# Patient Record
Sex: Male | Born: 1945 | Hispanic: No | Marital: Married | Smoking: Former smoker
Health system: Southern US, Community
[De-identification: ages and names within clinical notes are randomized; demographics above are authoritative.]

## PROBLEM LIST (undated history)

## (undated) ENCOUNTER — Inpatient Hospital Stay (HOSPITAL_COMMUNITY): Payer: Self-pay

## (undated) DIAGNOSIS — D6862 Lupus anticoagulant syndrome: Secondary | ICD-10-CM

## (undated) DIAGNOSIS — I639 Cerebral infarction, unspecified: Secondary | ICD-10-CM

## (undated) DIAGNOSIS — R4587 Impulsiveness: Secondary | ICD-10-CM

## (undated) DIAGNOSIS — B1910 Unspecified viral hepatitis B without hepatic coma: Secondary | ICD-10-CM

## (undated) DIAGNOSIS — D693 Immune thrombocytopenic purpura: Secondary | ICD-10-CM

## (undated) DIAGNOSIS — I10 Essential (primary) hypertension: Secondary | ICD-10-CM

## (undated) DIAGNOSIS — R569 Unspecified convulsions: Secondary | ICD-10-CM

## (undated) DIAGNOSIS — I2510 Atherosclerotic heart disease of native coronary artery without angina pectoris: Secondary | ICD-10-CM

## (undated) DIAGNOSIS — R1310 Dysphagia, unspecified: Secondary | ICD-10-CM

## (undated) DIAGNOSIS — I82409 Acute embolism and thrombosis of unspecified deep veins of unspecified lower extremity: Secondary | ICD-10-CM

## (undated) DIAGNOSIS — G4730 Sleep apnea, unspecified: Secondary | ICD-10-CM

## (undated) DIAGNOSIS — H9190 Unspecified hearing loss, unspecified ear: Secondary | ICD-10-CM

## (undated) DIAGNOSIS — S3282XA Multiple fractures of pelvis without disruption of pelvic ring, initial encounter for closed fracture: Secondary | ICD-10-CM

## (undated) HISTORY — PX: OTHER SURGICAL HISTORY: SHX169

## (undated) HISTORY — PX: CHOLECYSTECTOMY: SHX55

## (undated) HISTORY — PX: SPLENECTOMY, TOTAL: SHX788

---

## 1990-04-15 DIAGNOSIS — I2510 Atherosclerotic heart disease of native coronary artery without angina pectoris: Secondary | ICD-10-CM

## 1990-04-15 HISTORY — PX: CORONARY ANGIOPLASTY: SHX604

## 1990-04-15 HISTORY — DX: Atherosclerotic heart disease of native coronary artery without angina pectoris: I25.10

## 2000-10-14 ENCOUNTER — Ambulatory Visit (HOSPITAL_COMMUNITY): Admission: RE | Admit: 2000-10-14 | Discharge: 2000-10-14 | Payer: Self-pay | Admitting: Family Medicine

## 2000-10-14 ENCOUNTER — Encounter: Payer: Self-pay | Admitting: Family Medicine

## 2000-10-24 ENCOUNTER — Encounter: Payer: Self-pay | Admitting: General Surgery

## 2000-10-27 ENCOUNTER — Encounter: Payer: Self-pay | Admitting: General Surgery

## 2000-10-27 ENCOUNTER — Observation Stay (HOSPITAL_COMMUNITY): Admission: RE | Admit: 2000-10-27 | Discharge: 2000-10-28 | Payer: Self-pay | Admitting: General Surgery

## 2000-10-27 ENCOUNTER — Encounter (INDEPENDENT_AMBULATORY_CARE_PROVIDER_SITE_OTHER): Payer: Self-pay

## 2004-07-06 ENCOUNTER — Ambulatory Visit (HOSPITAL_COMMUNITY): Admission: RE | Admit: 2004-07-06 | Discharge: 2004-07-06 | Payer: Self-pay | Admitting: Family Medicine

## 2005-05-27 ENCOUNTER — Encounter: Admission: RE | Admit: 2005-05-27 | Discharge: 2005-05-27 | Payer: Self-pay | Admitting: Gastroenterology

## 2005-07-18 ENCOUNTER — Inpatient Hospital Stay (HOSPITAL_COMMUNITY): Admission: EM | Admit: 2005-07-18 | Discharge: 2005-07-20 | Payer: Self-pay | Admitting: Emergency Medicine

## 2005-07-20 ENCOUNTER — Ambulatory Visit: Payer: Self-pay | Admitting: Hematology & Oncology

## 2005-07-22 ENCOUNTER — Ambulatory Visit: Payer: Self-pay | Admitting: Hematology & Oncology

## 2005-07-23 LAB — CBC WITH DIFFERENTIAL/PLATELET
BASO%: 0.3 % (ref 0.0–2.0)
Basophils Absolute: 0 10e3/uL (ref 0.0–0.1)
EOS%: 0.1 % (ref 0.0–7.0)
Eosinophils Absolute: 0 10e3/uL (ref 0.0–0.5)
HCT: 36.8 % — ABNORMAL LOW (ref 38.7–49.9)
HGB: 13 g/dL (ref 13.0–17.1)
LYMPH%: 17.8 % (ref 14.0–48.0)
MCH: 31.6 pg (ref 28.0–33.4)
MCHC: 35.3 g/dL (ref 32.0–35.9)
MCV: 89.5 fL (ref 81.6–98.0)
MONO#: 0.8 10e3/uL (ref 0.1–0.9)
MONO%: 7.4 % (ref 0.0–13.0)
NEUT#: 8.1 10e3/uL — ABNORMAL HIGH (ref 1.5–6.5)
NEUT%: 74.4 % (ref 40.0–75.0)
Platelets: 236 10e3/uL (ref 145–400)
RBC: 4.12 10e6/uL — ABNORMAL LOW (ref 4.20–5.71)
RDW: 13.9 % (ref 11.2–14.6)
WBC: 10.9 10e3/uL — ABNORMAL HIGH (ref 4.0–10.0)
lymph#: 1.9 10e3/uL (ref 0.9–3.3)

## 2005-07-30 LAB — CBC WITH DIFFERENTIAL/PLATELET
Eosinophils Absolute: 0.1 10*3/uL (ref 0.0–0.5)
HGB: 13.5 g/dL (ref 13.0–17.1)
MONO#: 0.9 10*3/uL (ref 0.1–0.9)
NEUT#: 6.2 10*3/uL (ref 1.5–6.5)
Platelets: 192 10*3/uL (ref 145–400)
RBC: 4.28 10*6/uL (ref 4.20–5.71)
RDW: 14.6 % (ref 11.2–14.6)
WBC: 9.8 10*3/uL (ref 4.0–10.0)

## 2005-07-30 LAB — CHCC SMEAR

## 2005-08-09 LAB — CBC WITH DIFFERENTIAL/PLATELET
BASO%: 0 % (ref 0.0–2.0)
Basophils Absolute: 0 10*3/uL (ref 0.0–0.1)
Eosinophils Absolute: 0 10*3/uL (ref 0.0–0.5)
HCT: 43.8 % (ref 38.7–49.9)
HGB: 15 g/dL (ref 13.0–17.1)
LYMPH%: 6.2 % — ABNORMAL LOW (ref 14.0–48.0)
MCHC: 34.2 g/dL (ref 32.0–35.9)
MONO#: 0.5 10*3/uL (ref 0.1–0.9)
NEUT%: 89.8 % — ABNORMAL HIGH (ref 40.0–75.0)
Platelets: 157 10*3/uL (ref 145–400)
WBC: 13.4 10*3/uL — ABNORMAL HIGH (ref 4.0–10.0)
lymph#: 0.8 10*3/uL — ABNORMAL LOW (ref 0.9–3.3)

## 2005-08-09 LAB — MORPHOLOGY: PLT EST: ADEQUATE

## 2005-08-16 LAB — CBC WITH DIFFERENTIAL/PLATELET
Basophils Absolute: 0 10*3/uL (ref 0.0–0.1)
EOS%: 4.7 % (ref 0.0–7.0)
Eosinophils Absolute: 0.4 10*3/uL (ref 0.0–0.5)
HGB: 13.8 g/dL (ref 13.0–17.1)
LYMPH%: 16.1 % (ref 14.0–48.0)
MCH: 31.7 pg (ref 28.0–33.4)
MCV: 91.4 fL (ref 81.6–98.0)
MONO%: 9.1 % (ref 0.0–13.0)
NEUT#: 5.6 10*3/uL (ref 1.5–6.5)
Platelets: 84 10*3/uL — ABNORMAL LOW (ref 145–400)

## 2005-08-16 LAB — MORPHOLOGY

## 2005-08-26 LAB — CBC WITH DIFFERENTIAL/PLATELET
BASO%: 0.2 % (ref 0.0–2.0)
Basophils Absolute: 0 10*3/uL (ref 0.0–0.1)
Eosinophils Absolute: 0 10*3/uL (ref 0.0–0.5)
HCT: 41.6 % (ref 38.7–49.9)
HGB: 14.5 g/dL (ref 13.0–17.1)
MCHC: 34.9 g/dL (ref 32.0–35.9)
MONO#: 0.6 10*3/uL (ref 0.1–0.9)
NEUT#: 12.6 10*3/uL — ABNORMAL HIGH (ref 1.5–6.5)
NEUT%: 85.7 % — ABNORMAL HIGH (ref 40.0–75.0)
WBC: 14.7 10*3/uL — ABNORMAL HIGH (ref 4.0–10.0)
lymph#: 1.4 10*3/uL (ref 0.9–3.3)

## 2005-08-26 LAB — MORPHOLOGY: PLT EST: ADEQUATE

## 2005-09-01 LAB — ABO AND RH: Rh Type: POSITIVE

## 2005-09-10 ENCOUNTER — Ambulatory Visit: Payer: Self-pay | Admitting: Hematology & Oncology

## 2005-09-10 LAB — MORPHOLOGY: PLT EST: DECREASED

## 2005-09-10 LAB — CBC WITH DIFFERENTIAL/PLATELET
Basophils Absolute: 0 10*3/uL (ref 0.0–0.1)
Eosinophils Absolute: 0 10*3/uL (ref 0.0–0.5)
HGB: 9.4 g/dL — ABNORMAL LOW (ref 13.0–17.1)
MONO#: 0.5 10*3/uL (ref 0.1–0.9)
MONO%: 4.8 % (ref 0.0–13.0)
NEUT#: 8.2 10*3/uL — ABNORMAL HIGH (ref 1.5–6.5)
RBC: 2.73 10*6/uL — ABNORMAL LOW (ref 4.20–5.71)
RDW: 15.5 % — ABNORMAL HIGH (ref 11.2–14.6)
WBC: 10.3 10*3/uL — ABNORMAL HIGH (ref 4.0–10.0)
lymph#: 1.5 10*3/uL (ref 0.9–3.3)

## 2005-09-16 LAB — CBC WITH DIFFERENTIAL/PLATELET
Eosinophils Absolute: 0 10*3/uL (ref 0.0–0.5)
MONO#: 0.5 10*3/uL (ref 0.1–0.9)
NEUT#: 6 10*3/uL (ref 1.5–6.5)
Platelets: 134 10*3/uL — ABNORMAL LOW (ref 145–400)
RBC: 2.94 10*6/uL — ABNORMAL LOW (ref 4.20–5.71)
RDW: 19.9 % — ABNORMAL HIGH (ref 11.2–14.6)
WBC: 8.3 10*3/uL (ref 4.0–10.0)

## 2005-09-16 LAB — MORPHOLOGY: PLT EST: DECREASED

## 2005-09-23 LAB — CBC WITH DIFFERENTIAL/PLATELET
BASO%: 0.4 % (ref 0.0–2.0)
EOS%: 0.1 % (ref 0.0–7.0)
MCH: 35.2 pg — ABNORMAL HIGH (ref 28.0–33.4)
MCHC: 34.4 g/dL (ref 32.0–35.9)
RDW: 17.2 % — ABNORMAL HIGH (ref 11.2–14.6)
lymph#: 2.2 10*3/uL (ref 0.9–3.3)

## 2005-09-23 LAB — MORPHOLOGY

## 2005-09-30 LAB — CBC WITH DIFFERENTIAL/PLATELET
Basophils Absolute: 0 10*3/uL (ref 0.0–0.1)
Eosinophils Absolute: 0.1 10*3/uL (ref 0.0–0.5)
HCT: 34.7 % — ABNORMAL LOW (ref 38.7–49.9)
HGB: 11.9 g/dL — ABNORMAL LOW (ref 13.0–17.1)
MCV: 102.3 fL — ABNORMAL HIGH (ref 81.6–98.0)
MONO%: 10.2 % (ref 0.0–13.0)
NEUT#: 4.8 10*3/uL (ref 1.5–6.5)
NEUT%: 59 % (ref 40.0–75.0)
RDW: 15.7 % — ABNORMAL HIGH (ref 11.2–14.6)
lymph#: 2.4 10*3/uL (ref 0.9–3.3)

## 2005-10-14 LAB — CBC WITH DIFFERENTIAL/PLATELET
BASO%: 0.5 % (ref 0.0–2.0)
Eosinophils Absolute: 0.2 10*3/uL (ref 0.0–0.5)
MONO#: 1 10*3/uL — ABNORMAL HIGH (ref 0.1–0.9)
NEUT#: 7.1 10*3/uL — ABNORMAL HIGH (ref 1.5–6.5)
RBC: 4.07 10*6/uL — ABNORMAL LOW (ref 4.20–5.71)
RDW: 14.1 % (ref 11.2–14.6)
WBC: 11.4 10*3/uL — ABNORMAL HIGH (ref 4.0–10.0)

## 2005-10-22 ENCOUNTER — Ambulatory Visit: Payer: Self-pay | Admitting: Hematology & Oncology

## 2005-10-31 LAB — CBC WITH DIFFERENTIAL/PLATELET
Eosinophils Absolute: 0.1 10*3/uL (ref 0.0–0.5)
HCT: 39.9 % (ref 38.7–49.9)
LYMPH%: 18.5 % (ref 14.0–48.0)
MONO#: 0.9 10*3/uL (ref 0.1–0.9)
NEUT#: 9.4 10*3/uL — ABNORMAL HIGH (ref 1.5–6.5)
NEUT%: 73.3 % (ref 40.0–75.0)
Platelets: 191 10*3/uL (ref 145–400)
RBC: 4.15 10*6/uL — ABNORMAL LOW (ref 4.20–5.71)
WBC: 12.8 10*3/uL — ABNORMAL HIGH (ref 4.0–10.0)
lymph#: 2.4 10*3/uL (ref 0.9–3.3)

## 2005-11-22 LAB — CBC WITH DIFFERENTIAL/PLATELET
BASO%: 0.4 % (ref 0.0–2.0)
EOS%: 1.2 % (ref 0.0–7.0)
HCT: 40 % (ref 38.7–49.9)
LYMPH%: 32.5 % (ref 14.0–48.0)
MCH: 32 pg (ref 28.0–33.4)
MCHC: 34.7 g/dL (ref 32.0–35.9)
NEUT%: 57 % (ref 40.0–75.0)
lymph#: 2.6 10*3/uL (ref 0.9–3.3)

## 2005-12-19 ENCOUNTER — Ambulatory Visit: Payer: Self-pay | Admitting: Hematology & Oncology

## 2005-12-20 LAB — CBC WITH DIFFERENTIAL/PLATELET
BASO%: 0.4 % (ref 0.0–2.0)
EOS%: 0.5 % (ref 0.0–7.0)
MCH: 31.7 pg (ref 28.0–33.4)
MCHC: 35 g/dL (ref 32.0–35.9)
MCV: 90.6 fL (ref 81.6–98.0)
MONO%: 6.4 % (ref 0.0–13.0)
NEUT#: 10.6 10*3/uL — ABNORMAL HIGH (ref 1.5–6.5)
RBC: 4.7 10*6/uL (ref 4.20–5.71)
RDW: 13.8 % (ref 11.2–14.6)

## 2006-02-13 ENCOUNTER — Ambulatory Visit: Payer: Self-pay | Admitting: Hematology & Oncology

## 2006-03-05 LAB — CBC WITH DIFFERENTIAL/PLATELET
Basophils Absolute: 0 10*3/uL (ref 0.0–0.1)
EOS%: 1.1 % (ref 0.0–7.0)
Eosinophils Absolute: 0.1 10*3/uL (ref 0.0–0.5)
HCT: 40.8 % (ref 38.7–49.9)
HGB: 14.4 g/dL (ref 13.0–17.1)
MCH: 31.7 pg (ref 28.0–33.4)
MONO#: 1 10*3/uL — ABNORMAL HIGH (ref 0.1–0.9)
NEUT%: 58.4 % (ref 40.0–75.0)
lymph#: 2.1 10*3/uL (ref 0.9–3.3)

## 2006-03-10 LAB — CBC WITH DIFFERENTIAL/PLATELET
BASO%: 0.3 % (ref 0.0–2.0)
EOS%: 0.4 % (ref 0.0–7.0)
HCT: 40 % (ref 38.7–49.9)
LYMPH%: 31.3 % (ref 14.0–48.0)
MCH: 31.7 pg (ref 28.0–33.4)
MCHC: 35.1 g/dL (ref 32.0–35.9)
MCV: 90.2 fL (ref 81.6–98.0)
MONO#: 1 10*3/uL — ABNORMAL HIGH (ref 0.1–0.9)
NEUT%: 58.3 % (ref 40.0–75.0)
Platelets: 205 10*3/uL (ref 145–400)

## 2006-03-17 LAB — CBC WITH DIFFERENTIAL/PLATELET
Basophils Absolute: 0 10*3/uL (ref 0.0–0.1)
Eosinophils Absolute: 0.1 10*3/uL (ref 0.0–0.5)
HGB: 14.9 g/dL (ref 13.0–17.1)
LYMPH%: 36.4 % (ref 14.0–48.0)
MCV: 89.4 fL (ref 81.6–98.0)
MONO#: 1.5 10*3/uL — ABNORMAL HIGH (ref 0.1–0.9)
MONO%: 25.6 % — ABNORMAL HIGH (ref 0.0–13.0)
NEUT#: 2.1 10*3/uL (ref 1.5–6.5)
Platelets: 175 10*3/uL (ref 145–400)

## 2006-03-21 ENCOUNTER — Ambulatory Visit (HOSPITAL_COMMUNITY): Admission: RE | Admit: 2006-03-21 | Discharge: 2006-03-21 | Payer: Self-pay | Admitting: Gastroenterology

## 2006-03-27 ENCOUNTER — Ambulatory Visit: Payer: Self-pay | Admitting: Hematology & Oncology

## 2006-04-01 LAB — CBC WITH DIFFERENTIAL/PLATELET
Basophils Absolute: 0 10*3/uL (ref 0.0–0.1)
Eosinophils Absolute: 0.1 10*3/uL (ref 0.0–0.5)
HGB: 13.5 g/dL (ref 13.0–17.1)
MONO#: 0.8 10*3/uL (ref 0.1–0.9)
NEUT#: 3.1 10*3/uL (ref 1.5–6.5)
RDW: 13.5 % (ref 11.2–14.6)
lymph#: 2.4 10*3/uL (ref 0.9–3.3)

## 2006-04-11 LAB — CBC WITH DIFFERENTIAL/PLATELET
Eosinophils Absolute: 0 10*3/uL (ref 0.0–0.5)
HCT: 42.5 % (ref 38.7–49.9)
LYMPH%: 9.9 % — ABNORMAL LOW (ref 14.0–48.0)
MONO#: 0.3 10*3/uL (ref 0.1–0.9)
NEUT#: 6.6 10*3/uL — ABNORMAL HIGH (ref 1.5–6.5)
NEUT%: 85.8 % — ABNORMAL HIGH (ref 40.0–75.0)
Platelets: 68 10*3/uL — ABNORMAL LOW (ref 145–400)
RBC: 4.81 10*6/uL (ref 4.20–5.71)
WBC: 7.7 10*3/uL (ref 4.0–10.0)

## 2006-04-11 LAB — CHCC SMEAR

## 2006-04-18 ENCOUNTER — Encounter (HOSPITAL_COMMUNITY): Admission: RE | Admit: 2006-04-18 | Discharge: 2006-06-25 | Payer: Self-pay | Admitting: Hematology & Oncology

## 2006-04-18 LAB — CBC WITH DIFFERENTIAL/PLATELET
BASO%: 0.8 % (ref 0.0–2.0)
Basophils Absolute: 0 10*3/uL (ref 0.0–0.1)
Eosinophils Absolute: 0.1 10*3/uL (ref 0.0–0.5)
HCT: 39.9 % (ref 38.7–49.9)
HGB: 14.4 g/dL (ref 13.0–17.1)
LYMPH%: 25.5 % (ref 14.0–48.0)
MCHC: 36.2 g/dL — ABNORMAL HIGH (ref 32.0–35.9)
MONO#: 0.9 10*3/uL (ref 0.1–0.9)
NEUT#: 3.4 10*3/uL (ref 1.5–6.5)
NEUT%: 57.9 % (ref 40.0–75.0)
Platelets: <6 10*3/uL — CL (ref 145–400)
WBC: 6 10*3/uL (ref 4.0–10.0)
lymph#: 1.5 10*3/uL (ref 0.9–3.3)

## 2006-04-22 LAB — CBC WITH DIFFERENTIAL/PLATELET
Basophils Absolute: 0.1 10*3/uL (ref 0.0–0.1)
Eosinophils Absolute: 0.1 10*3/uL (ref 0.0–0.5)
HCT: 34.7 % — ABNORMAL LOW (ref 38.7–49.9)
HGB: 12.4 g/dL — ABNORMAL LOW (ref 13.0–17.1)
MCH: 31.2 pg (ref 28.0–33.4)
MCV: 87.1 fL (ref 81.6–98.0)
MONO%: 11.8 % (ref 0.0–13.0)
NEUT#: 3.7 10*3/uL (ref 1.5–6.5)
NEUT%: 55.2 % (ref 40.0–75.0)
RDW: 12.1 % (ref 11.2–14.6)
lymph#: 2.1 10*3/uL (ref 0.9–3.3)

## 2006-04-25 LAB — CBC WITH DIFFERENTIAL/PLATELET
Basophils Absolute: 0 10*3/uL (ref 0.0–0.1)
EOS%: 0.9 % (ref 0.0–7.0)
Eosinophils Absolute: 0.1 10*3/uL (ref 0.0–0.5)
HGB: 14.2 g/dL (ref 13.0–17.1)
LYMPH%: 22.9 % (ref 14.0–48.0)
MCH: 31.4 pg (ref 28.0–33.4)
MCV: 86 fL (ref 81.6–98.0)
MONO%: 11.3 % (ref 0.0–13.0)
NEUT#: 5.6 10*3/uL (ref 1.5–6.5)
Platelets: 228 10*3/uL (ref 145–400)

## 2006-04-25 LAB — CHCC SMEAR

## 2006-05-01 ENCOUNTER — Emergency Department (HOSPITAL_COMMUNITY): Admission: EM | Admit: 2006-05-01 | Discharge: 2006-05-01 | Payer: Self-pay | Admitting: Emergency Medicine

## 2006-05-03 ENCOUNTER — Emergency Department (HOSPITAL_COMMUNITY): Admission: EM | Admit: 2006-05-03 | Discharge: 2006-05-03 | Payer: Self-pay | Admitting: Emergency Medicine

## 2006-05-08 LAB — CBC WITH DIFFERENTIAL/PLATELET
Basophils Absolute: 0 10*3/uL (ref 0.0–0.1)
EOS%: 0.4 % (ref 0.0–7.0)
Eosinophils Absolute: 0 10*3/uL (ref 0.0–0.5)
HGB: 14 g/dL (ref 13.0–17.1)
MCH: 32.1 pg (ref 28.0–33.4)
MONO%: 5.7 % (ref 0.0–13.0)
NEUT#: 9.5 10*3/uL — ABNORMAL HIGH (ref 1.5–6.5)
RBC: 4.36 10*6/uL (ref 4.20–5.71)
RDW: 13.6 % (ref 11.2–14.6)
lymph#: 1.2 10*3/uL (ref 0.9–3.3)

## 2006-05-08 LAB — CHCC SMEAR

## 2006-05-09 ENCOUNTER — Ambulatory Visit: Payer: Self-pay | Admitting: Hematology & Oncology

## 2006-05-14 LAB — CBC WITH DIFFERENTIAL/PLATELET
BASO%: 0.3 % (ref 0.0–2.0)
Eosinophils Absolute: 0.1 10*3/uL (ref 0.0–0.5)
LYMPH%: 31 % (ref 14.0–48.0)
MCHC: 34.7 g/dL (ref 32.0–35.9)
MONO#: 0.6 10*3/uL (ref 0.1–0.9)
NEUT#: 4.6 10*3/uL (ref 1.5–6.5)
Platelets: 146 10*3/uL (ref 145–400)
RBC: 4.29 10*6/uL (ref 4.20–5.71)
WBC: 7.8 10*3/uL (ref 4.0–10.0)
lymph#: 2.4 10*3/uL (ref 0.9–3.3)

## 2006-05-16 ENCOUNTER — Ambulatory Visit (HOSPITAL_COMMUNITY): Admission: RE | Admit: 2006-05-16 | Discharge: 2006-05-16 | Payer: Self-pay | Admitting: Radiology

## 2006-05-16 ENCOUNTER — Encounter: Admission: RE | Admit: 2006-05-16 | Discharge: 2006-05-16 | Payer: Self-pay | Admitting: Specialist

## 2006-05-22 LAB — CBC WITH DIFFERENTIAL/PLATELET
BASO%: 0.5 % (ref 0.0–2.0)
Basophils Absolute: 0 10*3/uL (ref 0.0–0.1)
Eosinophils Absolute: 0.1 10*3/uL (ref 0.0–0.5)
HCT: 40.8 % (ref 38.7–49.9)
HGB: 14.2 g/dL (ref 13.0–17.1)
LYMPH%: 27.9 % (ref 14.0–48.0)
MONO#: 0.7 10*3/uL (ref 0.1–0.9)
NEUT#: 3.5 10*3/uL (ref 1.5–6.5)
NEUT%: 57.3 % (ref 40.0–75.0)
Platelets: 125 10*3/uL — ABNORMAL LOW (ref 145–400)
WBC: 6.1 10*3/uL (ref 4.0–10.0)
lymph#: 1.7 10*3/uL (ref 0.9–3.3)

## 2006-05-22 LAB — CHCC SMEAR

## 2006-05-29 LAB — CBC WITH DIFFERENTIAL/PLATELET
Basophils Absolute: 0 10*3/uL (ref 0.0–0.1)
Eosinophils Absolute: 0.1 10*3/uL (ref 0.0–0.5)
HGB: 14.5 g/dL (ref 13.0–17.1)
MCV: 89.1 fL (ref 81.6–98.0)
MONO#: 0.9 10*3/uL (ref 0.1–0.9)
NEUT#: 4.3 10*3/uL (ref 1.5–6.5)
RDW: 13.4 % (ref 11.2–14.6)
WBC: 10.3 10*3/uL — ABNORMAL HIGH (ref 4.0–10.0)
lymph#: 5 10*3/uL — ABNORMAL HIGH (ref 0.9–3.3)

## 2006-05-29 LAB — CHCC SMEAR

## 2006-06-05 LAB — CBC WITH DIFFERENTIAL/PLATELET
Eosinophils Absolute: 0.1 10*3/uL (ref 0.0–0.5)
HCT: 40.1 % (ref 38.7–49.9)
HGB: 14.5 g/dL (ref 13.0–17.1)
LYMPH%: 44.2 % (ref 14.0–48.0)
MONO#: 0.8 10*3/uL (ref 0.1–0.9)
NEUT#: 4.7 10*3/uL (ref 1.5–6.5)
NEUT%: 46.4 % (ref 40.0–75.0)
Platelets: 190 10*3/uL (ref 145–400)
WBC: 10.1 10*3/uL — ABNORMAL HIGH (ref 4.0–10.0)
lymph#: 4.5 10*3/uL — ABNORMAL HIGH (ref 0.9–3.3)

## 2006-06-12 LAB — CBC WITH DIFFERENTIAL/PLATELET
BASO%: 0.4 % (ref 0.0–2.0)
Eosinophils Absolute: 0.1 10*3/uL (ref 0.0–0.5)
HCT: 40.8 % (ref 38.7–49.9)
LYMPH%: 43.4 % (ref 14.0–48.0)
MCHC: 35.7 g/dL (ref 32.0–35.9)
MONO#: 0.9 10*3/uL (ref 0.1–0.9)
NEUT#: 3.8 10*3/uL (ref 1.5–6.5)
NEUT%: 44.8 % (ref 40.0–75.0)
Platelets: 152 10*3/uL (ref 145–400)
RBC: 4.56 10*6/uL (ref 4.20–5.71)
WBC: 8.6 10*3/uL (ref 4.0–10.0)
lymph#: 3.7 10*3/uL — ABNORMAL HIGH (ref 0.9–3.3)

## 2006-06-13 ENCOUNTER — Encounter: Admission: RE | Admit: 2006-06-13 | Discharge: 2006-06-13 | Payer: Self-pay | Admitting: Radiology

## 2006-06-20 LAB — CBC WITH DIFFERENTIAL/PLATELET
Basophils Absolute: 0 10*3/uL (ref 0.0–0.1)
HCT: 39.3 % (ref 38.7–49.9)
HGB: 14.1 g/dL (ref 13.0–17.1)
LYMPH%: 39.9 % (ref 14.0–48.0)
MCHC: 35.7 g/dL (ref 32.0–35.9)
MONO#: 1 10*3/uL — ABNORMAL HIGH (ref 0.1–0.9)
NEUT%: 47.7 % (ref 40.0–75.0)
Platelets: 144 10*3/uL — ABNORMAL LOW (ref 145–400)
WBC: 9.1 10*3/uL (ref 4.0–10.0)
lymph#: 3.6 10*3/uL — ABNORMAL HIGH (ref 0.9–3.3)

## 2006-06-24 ENCOUNTER — Ambulatory Visit: Payer: Self-pay | Admitting: Hematology & Oncology

## 2006-06-27 LAB — CBC WITH DIFFERENTIAL/PLATELET
Basophils Absolute: 0 10*3/uL (ref 0.0–0.1)
EOS%: 1.4 % (ref 0.0–7.0)
Eosinophils Absolute: 0.1 10*3/uL (ref 0.0–0.5)
HCT: 39.2 % (ref 38.7–49.9)
HGB: 13.9 g/dL (ref 13.0–17.1)
LYMPH%: 40.1 % (ref 14.0–48.0)
MCH: 31.8 pg (ref 28.0–33.4)
MCV: 89.3 fL (ref 81.6–98.0)
MONO%: 8.6 % (ref 0.0–13.0)
NEUT#: 3.3 10*3/uL (ref 1.5–6.5)
NEUT%: 49.8 % (ref 40.0–75.0)
Platelets: 141 10*3/uL — ABNORMAL LOW (ref 145–400)

## 2006-07-04 LAB — CBC WITH DIFFERENTIAL/PLATELET
Eosinophils Absolute: 0.1 10*3/uL (ref 0.0–0.5)
MCV: 89.7 fL (ref 81.6–98.0)
MONO#: 0.7 10*3/uL (ref 0.1–0.9)
MONO%: 10.7 % (ref 0.0–13.0)
NEUT#: 3.6 10*3/uL (ref 1.5–6.5)
RBC: 4.56 10*6/uL (ref 4.20–5.71)
RDW: 14.5 % (ref 11.2–14.6)
WBC: 6.4 10*3/uL (ref 4.0–10.0)
lymph#: 1.9 10*3/uL (ref 0.9–3.3)

## 2006-07-11 LAB — CBC WITH DIFFERENTIAL/PLATELET
Eosinophils Absolute: 0.1 10*3/uL (ref 0.0–0.5)
LYMPH%: 25 % (ref 14.0–48.0)
MONO#: 1.2 10*3/uL — ABNORMAL HIGH (ref 0.1–0.9)
NEUT#: 8.2 10*3/uL — ABNORMAL HIGH (ref 1.5–6.5)
Platelets: 178 10*3/uL (ref 145–400)
RBC: 4.73 10*6/uL (ref 4.20–5.71)
WBC: 12.8 10*3/uL — ABNORMAL HIGH (ref 4.0–10.0)

## 2006-07-18 LAB — CBC WITH DIFFERENTIAL/PLATELET
Basophils Absolute: 0 10*3/uL (ref 0.0–0.1)
Eosinophils Absolute: 0.1 10*3/uL (ref 0.0–0.5)
HCT: 39.3 % (ref 38.7–49.9)
LYMPH%: 35.1 % (ref 14.0–48.0)
MCHC: 35.8 g/dL (ref 32.0–35.9)
MONO#: 0.7 10*3/uL (ref 0.1–0.9)
NEUT#: 4.5 10*3/uL (ref 1.5–6.5)
NEUT%: 54.7 % (ref 40.0–75.0)
Platelets: 162 10*3/uL (ref 145–400)
WBC: 8.2 10*3/uL (ref 4.0–10.0)

## 2006-07-25 LAB — CBC WITH DIFFERENTIAL/PLATELET
Basophils Absolute: 0 10*3/uL (ref 0.0–0.1)
EOS%: 0.1 % (ref 0.0–7.0)
HCT: 41.6 % (ref 38.7–49.9)
HGB: 14.8 g/dL (ref 13.0–17.1)
MCH: 32.2 pg (ref 28.0–33.4)
MCV: 90.6 fL (ref 81.6–98.0)
MONO%: 7.8 % (ref 0.0–13.0)
NEUT%: 69.7 % (ref 40.0–75.0)
Platelets: 170 10*3/uL (ref 145–400)

## 2006-08-01 LAB — CBC WITH DIFFERENTIAL/PLATELET
Basophils Absolute: 0 10*3/uL (ref 0.0–0.1)
EOS%: 1.5 % (ref 0.0–7.0)
LYMPH%: 37.4 % (ref 14.0–48.0)
MCH: 32.1 pg (ref 28.0–33.4)
MCV: 91.3 fL (ref 81.6–98.0)
MONO%: 9.8 % (ref 0.0–13.0)
RBC: 4.34 10*6/uL (ref 4.20–5.71)
RDW: 14.4 % (ref 11.2–14.6)

## 2006-08-08 LAB — CBC WITH DIFFERENTIAL/PLATELET
BASO%: 0.6 % (ref 0.0–2.0)
Basophils Absolute: 0.1 10*3/uL (ref 0.0–0.1)
EOS%: 1.5 % (ref 0.0–7.0)
HCT: 39.5 % (ref 38.7–49.9)
HGB: 14.3 g/dL (ref 13.0–17.1)
LYMPH%: 35 % (ref 14.0–48.0)
MCH: 32.4 pg (ref 28.0–33.4)
MCHC: 36.1 g/dL — ABNORMAL HIGH (ref 32.0–35.9)
NEUT%: 52.8 % (ref 40.0–75.0)
Platelets: 178 10*3/uL (ref 145–400)
lymph#: 3.8 10*3/uL — ABNORMAL HIGH (ref 0.9–3.3)

## 2006-08-12 ENCOUNTER — Ambulatory Visit: Payer: Self-pay | Admitting: Hematology & Oncology

## 2006-08-15 LAB — CBC WITH DIFFERENTIAL/PLATELET
Basophils Absolute: 0 10*3/uL (ref 0.0–0.1)
EOS%: 1.7 % (ref 0.0–7.0)
Eosinophils Absolute: 0.1 10*3/uL (ref 0.0–0.5)
HGB: 13.4 g/dL (ref 13.0–17.1)
LYMPH%: 26.7 % (ref 14.0–48.0)
MCH: 32.8 pg (ref 28.0–33.4)
MCV: 91.3 fL (ref 81.6–98.0)
MONO%: 11.2 % (ref 0.0–13.0)
NEUT#: 5 10*3/uL (ref 1.5–6.5)
Platelets: 144 10*3/uL — ABNORMAL LOW (ref 145–400)
RBC: 4.09 10*6/uL — ABNORMAL LOW (ref 4.20–5.71)

## 2006-08-22 LAB — CBC WITH DIFFERENTIAL/PLATELET
Basophils Absolute: 0 10*3/uL (ref 0.0–0.1)
HCT: 41.3 % (ref 38.7–49.9)
HGB: 14.6 g/dL (ref 13.0–17.1)
LYMPH%: 37.9 % (ref 14.0–48.0)
MCH: 32.4 pg (ref 28.0–33.4)
MONO#: 0.8 10*3/uL (ref 0.1–0.9)
NEUT%: 50.8 % (ref 40.0–75.0)
Platelets: 192 10*3/uL (ref 145–400)
WBC: 8.9 10*3/uL (ref 4.0–10.0)
lymph#: 3.4 10*3/uL — ABNORMAL HIGH (ref 0.9–3.3)

## 2006-08-29 LAB — CBC WITH DIFFERENTIAL/PLATELET
Basophils Absolute: 0.1 10*3/uL (ref 0.0–0.1)
Eosinophils Absolute: 0.2 10*3/uL (ref 0.0–0.5)
HGB: 14.1 g/dL (ref 13.0–17.1)
LYMPH%: 32.8 % (ref 14.0–48.0)
MCV: 92.1 fL (ref 81.6–98.0)
MONO%: 9.2 % (ref 0.0–13.0)
NEUT#: 5.4 10*3/uL (ref 1.5–6.5)
NEUT%: 55.4 % (ref 40.0–75.0)
Platelets: 184 10*3/uL (ref 145–400)

## 2006-09-09 LAB — CBC WITH DIFFERENTIAL/PLATELET
BASO%: 0.3 % (ref 0.0–2.0)
EOS%: 3.2 % (ref 0.0–7.0)
HCT: 40.4 % (ref 38.7–49.9)
LYMPH%: 35.4 % (ref 14.0–48.0)
MCH: 32.9 pg (ref 28.0–33.4)
MCHC: 36.2 g/dL — ABNORMAL HIGH (ref 32.0–35.9)
MCV: 91.1 fL (ref 81.6–98.0)
MONO#: 0.8 10*3/uL (ref 0.1–0.9)
NEUT%: 50.2 % (ref 40.0–75.0)
Platelets: 131 10*3/uL — ABNORMAL LOW (ref 145–400)

## 2006-09-29 ENCOUNTER — Ambulatory Visit: Payer: Self-pay | Admitting: Hematology & Oncology

## 2006-09-29 LAB — CBC WITH DIFFERENTIAL/PLATELET
Basophils Absolute: 0 10*3/uL (ref 0.0–0.1)
EOS%: 1.7 % (ref 0.0–7.0)
Eosinophils Absolute: 0.1 10*3/uL (ref 0.0–0.5)
HCT: 39.6 % (ref 38.7–49.9)
HGB: 14.1 g/dL (ref 13.0–17.1)
LYMPH%: 35 % (ref 14.0–48.0)
MCH: 32.7 pg (ref 28.0–33.4)
MCV: 91.5 fL (ref 81.6–98.0)
MONO%: 10.6 % (ref 0.0–13.0)
NEUT#: 4 10*3/uL (ref 1.5–6.5)
NEUT%: 52.1 % (ref 40.0–75.0)
Platelets: 184 10*3/uL (ref 145–400)
RDW: 12.7 % (ref 11.2–14.6)

## 2006-10-03 LAB — CBC WITH DIFFERENTIAL/PLATELET
BASO%: 0.4 % (ref 0.0–2.0)
HCT: 39.4 % (ref 38.7–49.9)
LYMPH%: 38.4 % (ref 14.0–48.0)
MCH: 33.2 pg (ref 28.0–33.4)
MCHC: 36.3 g/dL — ABNORMAL HIGH (ref 32.0–35.9)
MCV: 91.4 fL (ref 81.6–98.0)
MONO%: 10.1 % (ref 0.0–13.0)
NEUT%: 49.2 % (ref 40.0–75.0)
Platelets: 166 10*3/uL (ref 145–400)
RBC: 4.31 10*6/uL (ref 4.20–5.71)

## 2006-10-03 LAB — CHCC SMEAR

## 2006-10-13 LAB — CBC WITH DIFFERENTIAL/PLATELET
BASO%: 0.4 % (ref 0.0–2.0)
Basophils Absolute: 0 10*3/uL (ref 0.0–0.1)
EOS%: 1.6 % (ref 0.0–7.0)
HGB: 14.7 g/dL (ref 13.0–17.1)
MCH: 32.8 pg (ref 28.0–33.4)
MCHC: 35.9 g/dL (ref 32.0–35.9)
MCV: 91.2 fL (ref 81.6–98.0)
MONO%: 9.2 % (ref 0.0–13.0)
NEUT%: 54.9 % (ref 40.0–75.0)
RDW: 12.7 % (ref 11.2–14.6)
lymph#: 2.9 10*3/uL (ref 0.9–3.3)

## 2006-10-20 LAB — CBC WITH DIFFERENTIAL/PLATELET
Basophils Absolute: 0 10*3/uL (ref 0.0–0.1)
EOS%: 1.4 % (ref 0.0–7.0)
HCT: 38.6 % — ABNORMAL LOW (ref 38.7–49.9)
HGB: 13.9 g/dL (ref 13.0–17.1)
LYMPH%: 31 % (ref 14.0–48.0)
MCH: 32.7 pg (ref 28.0–33.4)
MCV: 90.9 fL (ref 81.6–98.0)
MONO%: 10.3 % (ref 0.0–13.0)
NEUT%: 56.8 % (ref 40.0–75.0)
RDW: 13 % (ref 11.2–14.6)

## 2006-10-27 LAB — CBC WITH DIFFERENTIAL/PLATELET
BASO%: 0.2 % (ref 0.0–2.0)
LYMPH%: 33.3 % (ref 14.0–48.0)
MCHC: 36.1 g/dL — ABNORMAL HIGH (ref 32.0–35.9)
MCV: 91.2 fL (ref 81.6–98.0)
MONO%: 12.1 % (ref 0.0–13.0)
Platelets: 142 10*3/uL — ABNORMAL LOW (ref 145–400)
RBC: 4.52 10*6/uL (ref 4.20–5.71)

## 2006-10-27 LAB — CHCC SMEAR

## 2006-11-03 LAB — CBC WITH DIFFERENTIAL/PLATELET
Basophils Absolute: 0 10*3/uL (ref 0.0–0.1)
Eosinophils Absolute: 0.1 10*3/uL (ref 0.0–0.5)
HCT: 41.6 % (ref 38.7–49.9)
LYMPH%: 19.9 % (ref 14.0–48.0)
MONO#: 0.6 10*3/uL (ref 0.1–0.9)
Platelets: 130 10*3/uL — ABNORMAL LOW (ref 145–400)
RBC: 4.55 10*6/uL (ref 4.20–5.71)

## 2006-11-03 LAB — CHCC SMEAR

## 2006-11-10 LAB — CHCC SMEAR

## 2006-11-10 LAB — CBC WITH DIFFERENTIAL/PLATELET
BASO%: 0.2 % (ref 0.0–2.0)
EOS%: 0.5 % (ref 0.0–7.0)
MCH: 32.6 pg (ref 28.0–33.4)
MCHC: 36 g/dL — ABNORMAL HIGH (ref 32.0–35.9)
MONO#: 0.8 10*3/uL (ref 0.1–0.9)
RBC: 4.67 10*6/uL (ref 4.20–5.71)
RDW: 13.1 % (ref 11.2–14.6)
WBC: 9.5 10*3/uL (ref 4.0–10.0)
lymph#: 2 10*3/uL (ref 0.9–3.3)

## 2006-11-17 ENCOUNTER — Ambulatory Visit: Payer: Self-pay | Admitting: Hematology & Oncology

## 2006-11-17 LAB — CBC WITH DIFFERENTIAL/PLATELET
Eosinophils Absolute: 0.2 10*3/uL (ref 0.0–0.5)
MCV: 90 fL (ref 81.6–98.0)
MONO#: 0.7 10*3/uL (ref 0.1–0.9)
MONO%: 7.3 % (ref 0.0–13.0)
NEUT#: 6.2 10*3/uL (ref 1.5–6.5)
RBC: 4.51 10*6/uL (ref 4.20–5.71)
RDW: 13.2 % (ref 11.2–14.6)
WBC: 9.1 10*3/uL (ref 4.0–10.0)
lymph#: 2 10*3/uL (ref 0.9–3.3)

## 2006-11-17 LAB — CHCC SMEAR

## 2006-11-20 LAB — CBC WITH DIFFERENTIAL/PLATELET
BASO%: 0.2 % (ref 0.0–2.0)
Basophils Absolute: 0 10*3/uL (ref 0.0–0.1)
EOS%: 0.8 % (ref 0.0–7.0)
HGB: 16.5 g/dL (ref 13.0–17.1)
MCH: 32.6 pg (ref 28.0–33.4)
RDW: 12.9 % (ref 11.2–14.6)
lymph#: 2 10*3/uL (ref 0.9–3.3)

## 2006-11-27 LAB — CBC WITH DIFFERENTIAL/PLATELET
Eosinophils Absolute: 0.2 10*3/uL (ref 0.0–0.5)
MONO#: 0.9 10*3/uL (ref 0.1–0.9)
NEUT#: 4.4 10*3/uL (ref 1.5–6.5)
RBC: 4.3 10*6/uL (ref 4.20–5.71)
RDW: 12.8 % (ref 11.2–14.6)
WBC: 7.8 10*3/uL (ref 4.0–10.0)
lymph#: 2.3 10*3/uL (ref 0.9–3.3)

## 2006-11-27 LAB — CHCC SMEAR

## 2006-12-01 LAB — CBC WITH DIFFERENTIAL/PLATELET
BASO%: 0.2 % (ref 0.0–2.0)
Basophils Absolute: 0 10*3/uL (ref 0.0–0.1)
HCT: UNDETERMINED % (ref 38.7–49.9)
HGB: 14.3 g/dL (ref 13.0–17.1)
MONO#: 1.1 10*3/uL — ABNORMAL HIGH (ref 0.1–0.9)
NEUT%: 51.7 % (ref 40.0–75.0)
WBC: 7.7 10*3/uL (ref 4.0–10.0)
lymph#: 2.5 10*3/uL (ref 0.9–3.3)

## 2006-12-08 LAB — CBC WITH DIFFERENTIAL/PLATELET
Basophils Absolute: 0.1 10*3/uL (ref 0.0–0.1)
EOS%: 2.5 % (ref 0.0–7.0)
Eosinophils Absolute: 0.2 10*3/uL (ref 0.0–0.5)
HCT: 38.5 % — ABNORMAL LOW (ref 38.7–49.9)
HGB: 14.1 g/dL (ref 13.0–17.1)
MCH: 32.6 pg (ref 28.0–33.4)
MONO#: 1.1 10*3/uL — ABNORMAL HIGH (ref 0.1–0.9)
NEUT#: 4.5 10*3/uL (ref 1.5–6.5)
NEUT%: 57.5 % (ref 40.0–75.0)
RDW: 13.3 % (ref 11.2–14.6)
WBC: 7.9 10*3/uL (ref 4.0–10.0)
lymph#: 2 10*3/uL (ref 0.9–3.3)

## 2006-12-16 LAB — CBC WITH DIFFERENTIAL/PLATELET
Basophils Absolute: 0 10*3/uL (ref 0.0–0.1)
Eosinophils Absolute: 0.1 10*3/uL (ref 0.0–0.5)
HGB: 13.6 g/dL (ref 13.0–17.1)
LYMPH%: 23.5 % (ref 14.0–48.0)
MONO#: 0.8 10*3/uL (ref 0.1–0.9)
NEUT#: 4.4 10*3/uL (ref 1.5–6.5)
Platelets: 26 10*3/uL — ABNORMAL LOW (ref 145–400)
RBC: UNDETERMINED 10*6/uL (ref 4.20–5.71)
WBC: 7 10*3/uL (ref 4.0–10.0)

## 2006-12-16 LAB — CHCC SMEAR

## 2006-12-19 LAB — CBC WITH DIFFERENTIAL/PLATELET
Basophils Absolute: 0 10*3/uL (ref 0.0–0.1)
HCT: 34.6 % — ABNORMAL LOW (ref 38.7–49.9)
HGB: 12.7 g/dL — ABNORMAL LOW (ref 13.0–17.1)
MONO#: 0.5 10*3/uL (ref 0.1–0.9)
NEUT%: 66.9 % (ref 40.0–75.0)
Platelets: 29 10*3/uL — ABNORMAL LOW (ref 145–400)
WBC: 5.7 10*3/uL (ref 4.0–10.0)
lymph#: 1.3 10*3/uL (ref 0.9–3.3)

## 2006-12-23 LAB — CBC WITH DIFFERENTIAL/PLATELET
Basophils Absolute: 0 10*3/uL (ref 0.0–0.1)
EOS%: 0 % (ref 0.0–7.0)
HCT: 38.4 % — ABNORMAL LOW (ref 38.7–49.9)
HGB: 14.1 g/dL (ref 13.0–17.1)
MCH: 31.9 pg (ref 28.0–33.4)
MCV: 86.8 fL (ref 81.6–98.0)
MONO%: 2.7 % (ref 0.0–13.0)
NEUT%: 78.2 % — ABNORMAL HIGH (ref 40.0–75.0)
RDW: 11.7 % (ref 11.2–14.6)

## 2006-12-29 LAB — CBC WITH DIFFERENTIAL/PLATELET
BASO%: 0.1 % (ref 0.0–2.0)
Basophils Absolute: 0 10e3/uL (ref 0.0–0.1)
EOS%: 0 % (ref 0.0–7.0)
Eosinophils Absolute: 0 10e3/uL (ref 0.0–0.5)
HCT: 37 % — ABNORMAL LOW (ref 38.7–49.9)
HGB: 13.5 g/dL (ref 13.0–17.1)
LYMPH%: 10.1 % — ABNORMAL LOW (ref 14.0–48.0)
MCH: 33.1 pg (ref 28.0–33.4)
MCHC: 36.5 g/dL — ABNORMAL HIGH (ref 32.0–35.9)
MCV: 90.7 fL (ref 81.6–98.0)
MONO#: 0.1 10e3/uL (ref 0.1–0.9)
MONO%: 0.9 % (ref 0.0–13.0)
NEUT#: 6.7 10e3/uL — ABNORMAL HIGH (ref 1.5–6.5)
NEUT%: 88.9 % — ABNORMAL HIGH (ref 40.0–75.0)
Platelets: 189 10e3/uL (ref 145–400)
RBC: 4.08 10e6/uL — ABNORMAL LOW (ref 4.20–5.71)
RDW: 14.1 % (ref 11.2–14.6)
WBC: 7.6 10e3/uL (ref 4.0–10.0)
lymph#: 0.8 10e3/uL — ABNORMAL LOW (ref 0.9–3.3)

## 2006-12-29 LAB — BASIC METABOLIC PANEL WITH GFR
BUN: 25 mg/dL — ABNORMAL HIGH (ref 6–23)
CO2: 22 meq/L (ref 19–32)
Calcium: 9 mg/dL (ref 8.4–10.5)
Chloride: 102 meq/L (ref 96–112)
Creatinine, Ser: 1.13 mg/dL (ref 0.40–1.50)
Glucose, Bld: 183 mg/dL — ABNORMAL HIGH (ref 70–99)
Potassium: 4.7 meq/L (ref 3.5–5.3)
Sodium: 135 meq/L (ref 135–145)

## 2006-12-29 LAB — CHCC SMEAR

## 2006-12-31 ENCOUNTER — Ambulatory Visit: Payer: Self-pay | Admitting: Hematology & Oncology

## 2007-01-12 LAB — CBC WITH DIFFERENTIAL/PLATELET
Basophils Absolute: 0 10*3/uL (ref 0.0–0.1)
EOS%: 0 % (ref 0.0–7.0)
Eosinophils Absolute: 0 10*3/uL (ref 0.0–0.5)
HGB: 14.4 g/dL (ref 13.0–17.1)
MCH: 33.6 pg — ABNORMAL HIGH (ref 28.0–33.4)
NEUT#: 5.7 10*3/uL (ref 1.5–6.5)
RDW: 14.3 % (ref 11.2–14.6)
lymph#: 1.2 10*3/uL (ref 0.9–3.3)

## 2007-01-16 LAB — CBC WITH DIFFERENTIAL/PLATELET
Basophils Absolute: 0.1 10*3/uL (ref 0.0–0.1)
EOS%: 1.5 % (ref 0.0–7.0)
Eosinophils Absolute: 0.1 10*3/uL (ref 0.0–0.5)
HCT: 36.4 % — ABNORMAL LOW (ref 38.7–49.9)
HGB: 13.3 g/dL (ref 13.0–17.1)
LYMPH%: 26.4 % (ref 14.0–48.0)
MCH: 33.6 pg — ABNORMAL HIGH (ref 28.0–33.4)
MCV: 92.3 fL (ref 81.6–98.0)
MONO%: 8.2 % (ref 0.0–13.0)
NEUT#: 5.9 10*3/uL (ref 1.5–6.5)
NEUT%: 63.2 % (ref 40.0–75.0)
Platelets: 131 10*3/uL — ABNORMAL LOW (ref 145–400)
RDW: 14.1 % (ref 11.2–14.6)

## 2007-01-16 LAB — BASIC METABOLIC PANEL
BUN: 22 mg/dL (ref 6–23)
CO2: 26 mEq/L (ref 19–32)
Calcium: 9.1 mg/dL (ref 8.4–10.5)
Glucose, Bld: 99 mg/dL (ref 70–99)
Sodium: 143 mEq/L (ref 135–145)

## 2007-01-30 LAB — CBC WITH DIFFERENTIAL/PLATELET
BASO%: 0 % (ref 0.0–2.0)
EOS%: 0.4 % (ref 0.0–7.0)
HCT: 41.9 % (ref 38.7–49.9)
LYMPH%: 9.5 % — ABNORMAL LOW (ref 14.0–48.0)
MCH: 33.3 pg (ref 28.0–33.4)
MCHC: 36.3 g/dL — ABNORMAL HIGH (ref 32.0–35.9)
MONO#: 0.3 10*3/uL (ref 0.1–0.9)
NEUT%: 87.1 % — ABNORMAL HIGH (ref 40.0–75.0)
RBC: 4.56 10*6/uL (ref 4.20–5.71)
WBC: 9.7 10*3/uL (ref 4.0–10.0)
lymph#: 0.9 10*3/uL (ref 0.9–3.3)

## 2007-02-06 ENCOUNTER — Ambulatory Visit: Payer: Self-pay | Admitting: Hematology & Oncology

## 2007-02-16 LAB — CBC WITH DIFFERENTIAL/PLATELET
BASO%: 0.3 % (ref 0.0–2.0)
EOS%: 1.9 % (ref 0.0–7.0)
HGB: 14.6 g/dL (ref 13.0–17.1)
MCH: 32.8 pg (ref 28.0–33.4)
MCHC: 36.2 g/dL — ABNORMAL HIGH (ref 32.0–35.9)
MONO#: 0.7 10*3/uL (ref 0.1–0.9)
RDW: 12.9 % (ref 11.2–14.6)
WBC: 8.7 10*3/uL (ref 4.0–10.0)
lymph#: 2.7 10*3/uL (ref 0.9–3.3)

## 2007-02-27 LAB — CBC WITH DIFFERENTIAL/PLATELET
Basophils Absolute: 0 10*3/uL (ref 0.0–0.1)
EOS%: 1.5 % (ref 0.0–7.0)
Eosinophils Absolute: 0.1 10*3/uL (ref 0.0–0.5)
HCT: 38.5 % — ABNORMAL LOW (ref 38.7–49.9)
HGB: 14 g/dL (ref 13.0–17.1)
MCH: 32.6 pg (ref 28.0–33.4)
MCV: 89.8 fL (ref 81.6–98.0)
NEUT#: 5.3 10*3/uL (ref 1.5–6.5)
NEUT%: 60.9 % (ref 40.0–75.0)
RDW: 13 % (ref 11.2–14.6)
lymph#: 2.2 10*3/uL (ref 0.9–3.3)

## 2007-03-09 LAB — CBC WITH DIFFERENTIAL/PLATELET
Basophils Absolute: 0 10*3/uL (ref 0.0–0.1)
EOS%: 3.8 % (ref 0.0–7.0)
Eosinophils Absolute: 0.3 10*3/uL (ref 0.0–0.5)
HGB: 13.6 g/dL (ref 13.0–17.1)
LYMPH%: 31.1 % (ref 14.0–48.0)
MCH: 31.9 pg (ref 28.0–33.4)
MCV: 89.1 fL (ref 81.6–98.0)
MONO%: 10.2 % (ref 0.0–13.0)
NEUT#: 3.6 10*3/uL (ref 1.5–6.5)
NEUT%: 54.5 % (ref 40.0–75.0)
Platelets: 80 10*3/uL — ABNORMAL LOW (ref 145–400)

## 2007-03-16 LAB — CBC WITH DIFFERENTIAL/PLATELET
BASO%: 0.6 % (ref 0.0–2.0)
EOS%: 1.7 % (ref 0.0–7.0)
Eosinophils Absolute: 0.2 10*3/uL (ref 0.0–0.5)
LYMPH%: 28.6 % (ref 14.0–48.0)
MCH: 32.7 pg (ref 28.0–33.4)
MCHC: 36.5 g/dL — ABNORMAL HIGH (ref 32.0–35.9)
MCV: 89.4 fL (ref 81.6–98.0)
MONO%: 12.8 % (ref 0.0–13.0)
Platelets: 90 10*3/uL — ABNORMAL LOW (ref 145–400)
RBC: 4.49 10*6/uL (ref 4.20–5.71)
RDW: 12.9 % (ref 11.2–14.6)

## 2007-03-18 ENCOUNTER — Inpatient Hospital Stay (HOSPITAL_COMMUNITY): Admission: EM | Admit: 2007-03-18 | Discharge: 2007-03-23 | Payer: Self-pay | Admitting: Emergency Medicine

## 2007-03-18 ENCOUNTER — Ambulatory Visit: Payer: Self-pay | Admitting: Oncology

## 2007-03-30 ENCOUNTER — Ambulatory Visit: Payer: Self-pay | Admitting: Hematology & Oncology

## 2007-03-30 LAB — CHCC SMEAR

## 2007-03-30 LAB — CBC WITH DIFFERENTIAL/PLATELET
BASO%: 0.2 % (ref 0.0–2.0)
LYMPH%: 9.4 % — ABNORMAL LOW (ref 14.0–48.0)
MCHC: 35.5 g/dL (ref 32.0–35.9)
MONO#: 0.3 10*3/uL (ref 0.1–0.9)
Platelets: 176 10*3/uL (ref 145–400)
RBC: 4.44 10*6/uL (ref 4.20–5.71)
RDW: 13.2 % (ref 11.2–14.6)
WBC: 12.1 10*3/uL — ABNORMAL HIGH (ref 4.0–10.0)

## 2007-04-06 LAB — CBC WITH DIFFERENTIAL/PLATELET
BASO%: 1.3 % (ref 0.0–2.0)
EOS%: 1.5 % (ref 0.0–7.0)
HGB: 13.6 g/dL (ref 13.0–17.1)
MCH: 32.1 pg (ref 28.0–33.4)
MCHC: 35.3 g/dL (ref 32.0–35.9)
RDW: 13.6 % (ref 11.2–14.6)
WBC: 12.1 10*3/uL — ABNORMAL HIGH (ref 4.0–10.0)
lymph#: 2.8 10*3/uL (ref 0.9–3.3)

## 2007-04-13 LAB — CBC WITH DIFFERENTIAL/PLATELET
BASO%: 0.8 % (ref 0.0–2.0)
EOS%: 1.3 % (ref 0.0–7.0)
LYMPH%: 23.4 % (ref 14.0–48.0)
MCHC: 35 g/dL (ref 32.0–35.9)
MONO#: 0.9 10*3/uL (ref 0.1–0.9)
MONO%: 7.7 % (ref 0.0–13.0)
Platelets: 89 10*3/uL — ABNORMAL LOW (ref 145–400)
RBC: 4.41 10*6/uL (ref 4.20–5.71)
WBC: 12.1 10*3/uL — ABNORMAL HIGH (ref 4.0–10.0)

## 2007-04-13 LAB — CHCC SMEAR

## 2007-04-20 LAB — CBC WITH DIFFERENTIAL/PLATELET
BASO%: 0.8 % (ref 0.0–2.0)
EOS%: 1.2 % (ref 0.0–7.0)
MCH: 31.9 pg (ref 28.0–33.4)
MCHC: 35.3 g/dL (ref 32.0–35.9)
RBC: 4.48 10*6/uL (ref 4.20–5.71)
RDW: 14.2 % (ref 11.2–14.6)
lymph#: 2.9 10*3/uL (ref 0.9–3.3)

## 2007-05-08 LAB — CBC WITH DIFFERENTIAL/PLATELET
BASO%: 0.2 % (ref 0.0–2.0)
LYMPH%: 9.3 % — ABNORMAL LOW (ref 14.0–48.0)
MCH: 32.2 pg (ref 28.0–33.4)
MCHC: 35.5 g/dL (ref 32.0–35.9)
MCV: 90.8 fL (ref 81.6–98.0)
MONO%: 3.6 % (ref 0.0–13.0)
Platelets: 159 10*3/uL (ref 145–400)
RBC: 3.99 10*6/uL — ABNORMAL LOW (ref 4.20–5.71)

## 2007-05-14 ENCOUNTER — Ambulatory Visit: Payer: Self-pay | Admitting: Hematology & Oncology

## 2007-05-25 LAB — CBC WITH DIFFERENTIAL/PLATELET
BASO%: 0.6 % (ref 0.0–2.0)
Basophils Absolute: 0.1 10*3/uL (ref 0.0–0.1)
EOS%: 5.1 % (ref 0.0–7.0)
HGB: 13.7 g/dL (ref 13.0–17.1)
MCH: 30.9 pg (ref 28.0–33.4)
MCHC: 33.9 g/dL (ref 32.0–35.9)
RDW: 14 % (ref 11.2–14.6)
lymph#: 2.5 10*3/uL (ref 0.9–3.3)

## 2007-05-29 LAB — CBC WITH DIFFERENTIAL/PLATELET
Basophils Absolute: 0 10*3/uL (ref 0.0–0.1)
Eosinophils Absolute: 0.3 10*3/uL (ref 0.0–0.5)
HGB: 12.4 g/dL — ABNORMAL LOW (ref 13.0–17.1)
MONO#: 0.7 10*3/uL (ref 0.1–0.9)
NEUT#: 4.5 10*3/uL (ref 1.5–6.5)
RDW: 14.1 % (ref 11.2–14.6)
WBC: 7.7 10*3/uL (ref 4.0–10.0)
lymph#: 2.1 10*3/uL (ref 0.9–3.3)

## 2007-06-19 LAB — CBC WITH DIFFERENTIAL/PLATELET
BASO%: 0 % (ref 0.0–2.0)
HCT: 38.9 % (ref 38.7–49.9)
MCHC: 35.8 g/dL (ref 32.0–35.9)
MONO#: 0.3 10*3/uL (ref 0.1–0.9)
NEUT%: 81.1 % — ABNORMAL HIGH (ref 40.0–75.0)
RBC: 4.38 10*6/uL (ref 4.20–5.71)
WBC: 6.8 10*3/uL (ref 4.0–10.0)
lymph#: 1 10*3/uL (ref 0.9–3.3)

## 2007-06-25 ENCOUNTER — Ambulatory Visit: Payer: Self-pay | Admitting: Hematology & Oncology

## 2007-06-29 LAB — CBC WITH DIFFERENTIAL/PLATELET
BASO%: 0.3 % (ref 0.0–2.0)
EOS%: 3 % (ref 0.0–7.0)
HCT: 39.4 % (ref 38.7–49.9)
LYMPH%: 29.8 % (ref 14.0–48.0)
MCH: 31.7 pg (ref 28.0–33.4)
MCHC: 35.3 g/dL (ref 32.0–35.9)
MCV: 89.8 fL (ref 81.6–98.0)
NEUT%: 54.7 % (ref 40.0–75.0)
Platelets: 287 10*3/uL (ref 145–400)

## 2007-07-06 LAB — CBC WITH DIFFERENTIAL/PLATELET
BASO%: 0.1 % (ref 0.0–2.0)
EOS%: 2.7 % (ref 0.0–7.0)
HCT: 41 % (ref 38.7–49.9)
MCH: 31.8 pg (ref 28.0–33.4)
MCHC: 35.7 g/dL (ref 32.0–35.9)
NEUT%: 62 % (ref 40.0–75.0)
RDW: 12.9 % (ref 11.2–14.6)
lymph#: 1.8 10*3/uL (ref 0.9–3.3)

## 2007-07-20 LAB — CBC WITH DIFFERENTIAL/PLATELET
Basophils Absolute: 0 10*3/uL (ref 0.0–0.1)
Eosinophils Absolute: 0.3 10*3/uL (ref 0.0–0.5)
HCT: 38.3 % — ABNORMAL LOW (ref 38.7–49.9)
HGB: 13.6 g/dL (ref 13.0–17.1)
MCV: 87.6 fL (ref 81.6–98.0)
MONO%: 9 % (ref 0.0–13.0)
NEUT#: 6.1 10*3/uL (ref 1.5–6.5)
NEUT%: 67 % (ref 40.0–75.0)
RDW: 12.8 % (ref 11.2–14.6)
lymph#: 1.9 10*3/uL (ref 0.9–3.3)

## 2007-08-03 LAB — CBC WITH DIFFERENTIAL/PLATELET
Basophils Absolute: 0 10*3/uL (ref 0.0–0.1)
Eosinophils Absolute: 0.2 10*3/uL (ref 0.0–0.5)
HGB: 14.4 g/dL (ref 13.0–17.1)
LYMPH%: 26.2 % (ref 14.0–48.0)
MCV: 86.3 fL (ref 81.6–98.0)
MONO%: 11.6 % (ref 0.0–13.0)
NEUT#: 5.1 10*3/uL (ref 1.5–6.5)
NEUT%: 59.8 % (ref 40.0–75.0)
Platelets: 17 10*3/uL — ABNORMAL LOW (ref 145–400)

## 2007-08-05 LAB — CBC WITH DIFFERENTIAL/PLATELET
BASO%: 1.4 % (ref 0.0–2.0)
EOS%: 2.6 % (ref 0.0–7.0)
Eosinophils Absolute: 0.2 10*3/uL (ref 0.0–0.5)
LYMPH%: 22.9 % (ref 14.0–48.0)
MCH: 31 pg (ref 28.0–33.4)
MCHC: 36 g/dL — ABNORMAL HIGH (ref 32.0–35.9)
MCV: 86.1 fL (ref 81.6–98.0)
MONO%: 12.8 % (ref 0.0–13.0)
Platelets: <6 10*3/uL — CL (ref 145–400)
RBC: 4.86 10*6/uL (ref 4.20–5.71)

## 2007-08-06 ENCOUNTER — Ambulatory Visit: Payer: Self-pay | Admitting: Hematology & Oncology

## 2007-08-10 LAB — CBC WITH DIFFERENTIAL/PLATELET
BASO%: 0.2 % (ref 0.0–2.0)
LYMPH%: 21.4 % (ref 14.0–48.0)
MCHC: 36.1 g/dL — ABNORMAL HIGH (ref 32.0–35.9)
MONO#: 1.5 10*3/uL — ABNORMAL HIGH (ref 0.1–0.9)
Platelets: 259 10*3/uL (ref 145–400)
RBC: 3.65 10*6/uL — ABNORMAL LOW (ref 4.20–5.71)
WBC: 16 10*3/uL — ABNORMAL HIGH (ref 4.0–10.0)

## 2007-08-17 LAB — CBC WITH DIFFERENTIAL/PLATELET
Basophils Absolute: 0 10*3/uL (ref 0.0–0.1)
EOS%: 2.9 % (ref 0.0–7.0)
HCT: 27.9 % — ABNORMAL LOW (ref 38.7–49.9)
HGB: 9.7 g/dL — ABNORMAL LOW (ref 13.0–17.1)
LYMPH%: 17.7 % (ref 14.0–48.0)
MCH: 31.8 pg (ref 28.0–33.4)
NEUT%: 69.7 % (ref 40.0–75.0)
Platelets: 304 10*3/uL (ref 145–400)
lymph#: 1.7 10*3/uL (ref 0.9–3.3)

## 2007-08-17 LAB — RETICULOCYTES
IRF: 0.52 — ABNORMAL HIGH (ref 0.070–0.380)
RETIC #: 161.4 10*3/uL — ABNORMAL HIGH (ref 31.8–103.9)
Retic %: 5.3 % — ABNORMAL HIGH (ref 0.7–2.3)

## 2007-08-17 LAB — CHCC SMEAR

## 2007-08-18 LAB — FERRITIN: Ferritin: 486 ng/mL — ABNORMAL HIGH (ref 22–322)

## 2007-08-19 LAB — CBC WITH DIFFERENTIAL/PLATELET
BASO%: 0.6 % (ref 0.0–2.0)
HCT: 29.3 % — ABNORMAL LOW (ref 38.7–49.9)
LYMPH%: 15 % (ref 14.0–48.0)
MCHC: 34.7 g/dL (ref 32.0–35.9)
MCV: 93 fL (ref 81.6–98.0)
MONO#: 1 10*3/uL — ABNORMAL HIGH (ref 0.1–0.9)
MONO%: 11.1 % (ref 0.0–13.0)
NEUT%: 70.6 % (ref 40.0–75.0)
Platelets: 271 10*3/uL (ref 145–400)
RBC: 3.15 10*6/uL — ABNORMAL LOW (ref 4.20–5.71)
WBC: 9.4 10*3/uL (ref 4.0–10.0)

## 2007-08-20 ENCOUNTER — Encounter: Admission: RE | Admit: 2007-08-20 | Discharge: 2007-08-20 | Payer: Self-pay | Admitting: General Surgery

## 2007-08-20 LAB — COMPREHENSIVE METABOLIC PANEL
ALT: 22 U/L (ref 0–53)
Alkaline Phosphatase: 73 U/L (ref 39–117)
CO2: 25 mEq/L (ref 19–32)
Creatinine, Ser: 0.99 mg/dL (ref 0.40–1.50)
Glucose, Bld: 97 mg/dL (ref 70–99)
Sodium: 139 mEq/L (ref 135–145)
Total Bilirubin: 1.3 mg/dL — ABNORMAL HIGH (ref 0.3–1.2)

## 2007-08-20 LAB — MAGNESIUM: Magnesium: 2 mg/dL (ref 1.5–2.5)

## 2007-08-24 LAB — CBC WITH DIFFERENTIAL/PLATELET
BASO%: 0.4 % (ref 0.0–2.0)
Basophils Absolute: 0 10e3/uL (ref 0.0–0.1)
EOS%: 0.6 % (ref 0.0–7.0)
Eosinophils Absolute: 0.1 10e3/uL (ref 0.0–0.5)
HCT: 30 % — ABNORMAL LOW (ref 38.7–49.9)
HGB: 10.4 g/dL — ABNORMAL LOW (ref 13.0–17.1)
LYMPH%: 19.8 % (ref 14.0–48.0)
MCH: 32.4 pg (ref 28.0–33.4)
MCHC: 34.5 g/dL (ref 32.0–35.9)
MCV: 93.8 fL (ref 81.6–98.0)
MONO#: 0.8 10e3/uL (ref 0.1–0.9)
MONO%: 7.4 % (ref 0.0–13.0)
NEUT#: 7.8 10e3/uL — ABNORMAL HIGH (ref 1.5–6.5)
NEUT%: 71.8 % (ref 40.0–75.0)
Platelets: 202 10e3/uL (ref 145–400)
RBC: 3.2 10e6/uL — ABNORMAL LOW (ref 4.20–5.71)
RDW: 16.3 % — ABNORMAL HIGH (ref 11.2–14.6)
WBC: 10.9 10e3/uL — ABNORMAL HIGH (ref 4.0–10.0)
lymph#: 2.2 10e3/uL (ref 0.9–3.3)

## 2007-08-27 ENCOUNTER — Encounter: Admission: RE | Admit: 2007-08-27 | Discharge: 2007-08-27 | Payer: Self-pay | Admitting: General Surgery

## 2007-08-31 LAB — CBC WITH DIFFERENTIAL/PLATELET
Basophils Absolute: 0 10*3/uL (ref 0.0–0.1)
EOS%: 4.2 % (ref 0.0–7.0)
HCT: 31.3 % — ABNORMAL LOW (ref 38.7–49.9)
HGB: 10.8 g/dL — ABNORMAL LOW (ref 13.0–17.1)
LYMPH%: 18.9 % (ref 14.0–48.0)
MCH: 32 pg (ref 28.0–33.4)
MCHC: 34.6 g/dL (ref 32.0–35.9)
MCV: 92.4 fL (ref 81.6–98.0)
MONO%: 14.3 % — ABNORMAL HIGH (ref 0.0–13.0)
NEUT%: 62.3 % (ref 40.0–75.0)
Platelets: 158 10*3/uL (ref 145–400)
lymph#: 1.6 10*3/uL (ref 0.9–3.3)

## 2007-09-16 ENCOUNTER — Encounter (INDEPENDENT_AMBULATORY_CARE_PROVIDER_SITE_OTHER): Payer: Self-pay | Admitting: Surgery

## 2007-09-16 ENCOUNTER — Ambulatory Visit: Payer: Self-pay | Admitting: Hematology & Oncology

## 2007-09-16 ENCOUNTER — Inpatient Hospital Stay (HOSPITAL_COMMUNITY): Admission: RE | Admit: 2007-09-16 | Discharge: 2007-09-21 | Payer: Self-pay | Admitting: Surgery

## 2007-09-23 ENCOUNTER — Ambulatory Visit: Payer: Self-pay | Admitting: Hematology & Oncology

## 2007-09-28 LAB — CBC & DIFF AND RETIC
BASO%: 0.3 % (ref 0.0–2.0)
Eosinophils Absolute: 0.3 10*3/uL (ref 0.0–0.5)
LYMPH%: 17.3 % (ref 14.0–48.0)
MCHC: 33.4 g/dL (ref 32.0–35.9)
MONO#: 1.9 10*3/uL — ABNORMAL HIGH (ref 0.1–0.9)
MONO%: 16.8 % — ABNORMAL HIGH (ref 0.0–13.0)
NEUT#: 7.1 10*3/uL — ABNORMAL HIGH (ref 1.5–6.5)
RBC: 4.51 10*6/uL (ref 4.20–5.71)
RDW: 15.3 % — ABNORMAL HIGH (ref 11.2–14.6)
RETIC #: 76.7 10*3/uL (ref 31.8–103.9)
Retic %: 1.7 % (ref 0.7–2.3)
WBC: 11.2 10*3/uL — ABNORMAL HIGH (ref 4.0–10.0)

## 2007-10-12 LAB — CBC WITH DIFFERENTIAL/PLATELET
Basophils Absolute: 0 10*3/uL (ref 0.0–0.1)
Eosinophils Absolute: 0.7 10*3/uL — ABNORMAL HIGH (ref 0.0–0.5)
HCT: 41.2 % (ref 38.7–49.9)
HGB: 14 g/dL (ref 13.0–17.1)
LYMPH%: 29.5 % (ref 14.0–48.0)
MCV: 85.9 fL (ref 81.6–98.0)
MONO#: 1.4 10*3/uL — ABNORMAL HIGH (ref 0.1–0.9)
MONO%: 19.7 % — ABNORMAL HIGH (ref 0.0–13.0)
NEUT#: 2.7 10*3/uL (ref 1.5–6.5)
NEUT%: 39.7 % — ABNORMAL LOW (ref 40.0–75.0)
Platelets: 97 10*3/uL — ABNORMAL LOW (ref 145–400)
WBC: 6.9 10*3/uL (ref 4.0–10.0)

## 2007-10-12 LAB — CHCC SMEAR

## 2007-10-23 ENCOUNTER — Ambulatory Visit: Payer: Self-pay | Admitting: Hematology & Oncology

## 2007-10-26 LAB — CBC WITH DIFFERENTIAL (CANCER CENTER ONLY)
BASO#: 0.1 10*3/uL (ref 0.0–0.2)
EOS%: 6.5 % (ref 0.0–7.0)
HGB: 15.1 g/dL (ref 13.0–17.1)
LYMPH%: 21.1 % (ref 14.0–48.0)
MCH: 29.3 pg (ref 28.0–33.4)
MCHC: 33.2 g/dL (ref 32.0–35.9)
MONO%: 16.8 % — ABNORMAL HIGH (ref 0.0–13.0)
NEUT#: 5 10*3/uL (ref 1.5–6.5)
Platelets: 86 10*3/uL — ABNORMAL LOW (ref 145–400)

## 2007-10-26 LAB — CHCC SATELLITE - SMEAR

## 2007-11-06 LAB — CHCC SATELLITE - SMEAR

## 2007-11-06 LAB — CBC WITH DIFFERENTIAL (CANCER CENTER ONLY)
BASO%: 0.8 % (ref 0.0–2.0)
EOS%: 5 % (ref 0.0–7.0)
HCT: 45.7 % (ref 38.7–49.9)
LYMPH%: 28.5 % (ref 14.0–48.0)
MCHC: 33.2 g/dL (ref 32.0–35.9)
MCV: 87 fL (ref 82–98)
NEUT%: 49.6 % (ref 40.0–80.0)
Platelets: 206 10*3/uL (ref 145–400)
RDW: 12.4 % (ref 10.5–14.6)
WBC: 8.7 10*3/uL (ref 4.0–10.0)

## 2007-11-23 LAB — CBC WITH DIFFERENTIAL (CANCER CENTER ONLY)
BASO%: 1.3 % (ref 0.0–2.0)
EOS%: 6 % (ref 0.0–7.0)
HCT: 46 % (ref 38.7–49.9)
HGB: 15.4 g/dL (ref 13.0–17.1)
LYMPH#: 3 10*3/uL (ref 0.9–3.3)
MCV: 85 fL (ref 82–98)
MONO%: 18.8 % — ABNORMAL HIGH (ref 0.0–13.0)
NEUT#: 2.9 10*3/uL (ref 1.5–6.5)
RDW: 13.4 % (ref 10.5–14.6)
WBC: 7.9 10*3/uL (ref 4.0–10.0)

## 2007-11-23 LAB — CHCC SATELLITE - SMEAR

## 2007-12-07 LAB — CBC WITH DIFFERENTIAL (CANCER CENTER ONLY)
BASO%: 0.9 % (ref 0.0–2.0)
Eosinophils Absolute: 0.5 10*3/uL (ref 0.0–0.5)
HCT: 44.5 % (ref 38.7–49.9)
LYMPH#: 2.4 10*3/uL (ref 0.9–3.3)
LYMPH%: 28.7 % (ref 14.0–48.0)
MCV: 84 fL (ref 82–98)
MONO#: 1.1 10*3/uL — ABNORMAL HIGH (ref 0.1–0.9)
NEUT%: 51.1 % (ref 40.0–80.0)
RBC: 5.32 10*6/uL (ref 4.20–5.70)
WBC: 8.4 10*3/uL (ref 4.0–10.0)

## 2007-12-07 LAB — CHCC SATELLITE - SMEAR

## 2007-12-14 ENCOUNTER — Ambulatory Visit: Payer: Self-pay | Admitting: Hematology & Oncology

## 2007-12-14 LAB — CBC WITH DIFFERENTIAL (CANCER CENTER ONLY)
BASO%: 0.8 % (ref 0.0–2.0)
Eosinophils Absolute: 0.4 10*3/uL (ref 0.0–0.5)
LYMPH#: 2.5 10*3/uL (ref 0.9–3.3)
MONO#: 1.2 10*3/uL — ABNORMAL HIGH (ref 0.1–0.9)
NEUT#: 3.1 10*3/uL (ref 1.5–6.5)
Platelets: <6 10*3/uL — CL (ref 145–400)
RBC: 5.43 10*6/uL (ref 4.20–5.70)
RDW: 14.8 % — ABNORMAL HIGH (ref 10.5–14.6)
WBC: 7.3 10*3/uL (ref 4.0–10.0)

## 2007-12-14 LAB — MORPHOLOGY - CHCC SATELLITE: PLT EST ~~LOC~~: DECREASED

## 2007-12-22 LAB — CBC WITH DIFFERENTIAL (CANCER CENTER ONLY)
BASO%: 1.2 % (ref 0.0–2.0)
Eosinophils Absolute: 0.6 10*3/uL — ABNORMAL HIGH (ref 0.0–0.5)
HCT: 34.9 % — ABNORMAL LOW (ref 38.7–49.9)
LYMPH%: 33.2 % (ref 14.0–48.0)
MCH: 27.9 pg — ABNORMAL LOW (ref 28.0–33.4)
MCV: 82 fL (ref 82–98)
MONO#: 1.5 10*3/uL — ABNORMAL HIGH (ref 0.1–0.9)
MONO%: 15.6 % — ABNORMAL HIGH (ref 0.0–13.0)
NEUT%: 43.8 % (ref 40.0–80.0)
Platelets: 91 10*3/uL — ABNORMAL LOW (ref 145–400)
RDW: 15.5 % — ABNORMAL HIGH (ref 10.5–14.6)
WBC: 9.4 10*3/uL (ref 4.0–10.0)

## 2007-12-28 LAB — MANUAL DIFFERENTIAL (CHCC SATELLITE)
ALC: 2.5 10*3/uL (ref 0.9–3.3)
ANC (CHCC HP manual diff): 3.6 10*3/uL (ref 1.5–6.5)
MONO: 19 % — ABNORMAL HIGH (ref 0–13)
Metamyelocytes: 1 % — ABNORMAL HIGH (ref 0–0)

## 2007-12-28 LAB — CBC WITH DIFFERENTIAL (CANCER CENTER ONLY)
HCT: 36.1 % — ABNORMAL LOW (ref 38.7–49.9)
HGB: 12.2 g/dL — ABNORMAL LOW (ref 13.0–17.1)
MCH: 27.7 pg — ABNORMAL LOW (ref 28.0–33.4)
MCHC: 33.8 g/dL (ref 32.0–35.9)
MCV: 82 fL (ref 82–98)
RBC: 4.41 10*6/uL (ref 4.20–5.70)

## 2008-01-04 LAB — CBC WITH DIFFERENTIAL (CANCER CENTER ONLY)
BASO#: 0.1 10*3/uL (ref 0.0–0.2)
Eosinophils Absolute: 0.6 10*3/uL — ABNORMAL HIGH (ref 0.0–0.5)
HCT: 36.8 % — ABNORMAL LOW (ref 38.7–49.9)
HGB: 12.5 g/dL — ABNORMAL LOW (ref 13.0–17.1)
LYMPH#: 2.5 10*3/uL (ref 0.9–3.3)
MCHC: 34 g/dL (ref 32.0–35.9)
MONO#: 1.1 10*3/uL — ABNORMAL HIGH (ref 0.1–0.9)
NEUT#: 2.4 10*3/uL (ref 1.5–6.5)
NEUT%: 35.8 % — ABNORMAL LOW (ref 40.0–80.0)
RBC: 4.44 10*6/uL (ref 4.20–5.70)
WBC: 6.6 10*3/uL (ref 4.0–10.0)

## 2008-01-11 LAB — CBC WITH DIFFERENTIAL (CANCER CENTER ONLY)
BASO%: 0.9 % (ref 0.0–2.0)
EOS%: 7.1 % — ABNORMAL HIGH (ref 0.0–7.0)
HCT: 41.8 % (ref 38.7–49.9)
LYMPH#: 2.9 10*3/uL (ref 0.9–3.3)
MONO#: 1.1 10*3/uL — ABNORMAL HIGH (ref 0.1–0.9)
NEUT#: 3 10*3/uL (ref 1.5–6.5)
NEUT%: 39.5 % — ABNORMAL LOW (ref 40.0–80.0)
RDW: 15.6 % — ABNORMAL HIGH (ref 10.5–14.6)
WBC: 7.6 10*3/uL (ref 4.0–10.0)

## 2008-01-15 LAB — CBC WITH DIFFERENTIAL (CANCER CENTER ONLY)
BASO#: 0.1 10*3/uL (ref 0.0–0.2)
EOS%: 6 % (ref 0.0–7.0)
HGB: 13.3 g/dL (ref 13.0–17.1)
MCH: 28.7 pg (ref 28.0–33.4)
MCHC: 34.3 g/dL (ref 32.0–35.9)
MONO%: 17.9 % — ABNORMAL HIGH (ref 0.0–13.0)
NEUT#: 3 10*3/uL (ref 1.5–6.5)
NEUT%: 32.5 % — ABNORMAL LOW (ref 40.0–80.0)
RDW: 15.6 % — ABNORMAL HIGH (ref 10.5–14.6)

## 2008-01-18 LAB — CBC WITH DIFFERENTIAL (CANCER CENTER ONLY)
BASO%: 1 % (ref 0.0–2.0)
EOS%: 1.1 % (ref 0.0–7.0)
MCH: 28.5 pg (ref 28.0–33.4)
MCHC: 33.5 g/dL (ref 32.0–35.9)
MONO%: 6.3 % (ref 0.0–13.0)
NEUT#: 19.2 10*3/uL — ABNORMAL HIGH (ref 1.5–6.5)
Platelets: 196 10*3/uL (ref 145–400)

## 2008-01-18 LAB — BASIC METABOLIC PANEL - CANCER CENTER ONLY
CO2: 24 mEq/L (ref 18–33)
Calcium: 8.7 mg/dL (ref 8.0–10.3)
Chloride: 100 mEq/L (ref 98–108)
Sodium: 134 mEq/L (ref 128–145)

## 2008-01-25 LAB — CBC WITH DIFFERENTIAL (CANCER CENTER ONLY)
Eosinophils Absolute: 0.6 10*3/uL — ABNORMAL HIGH (ref 0.0–0.5)
MONO#: 1.2 10*3/uL — ABNORMAL HIGH (ref 0.1–0.9)
MONO%: 12.2 % (ref 0.0–13.0)
NEUT#: 6 10*3/uL (ref 1.5–6.5)
Platelets: 616 10*3/uL — ABNORMAL HIGH (ref 145–400)
RBC: 4.55 10*6/uL (ref 4.20–5.70)
WBC: 10 10*3/uL (ref 4.0–10.0)

## 2008-01-29 ENCOUNTER — Ambulatory Visit: Payer: Self-pay | Admitting: Hematology & Oncology

## 2008-02-01 LAB — CBC WITH DIFFERENTIAL (CANCER CENTER ONLY)
Eosinophils Absolute: 0.4 10*3/uL (ref 0.0–0.5)
HCT: 37.8 % — ABNORMAL LOW (ref 38.7–49.9)
LYMPH%: 41.4 % (ref 14.0–48.0)
MCV: 85 fL (ref 82–98)
MONO#: 0.8 10*3/uL (ref 0.1–0.9)
Platelets: 506 10*3/uL — ABNORMAL HIGH (ref 145–400)
RBC: 4.43 10*6/uL (ref 4.20–5.70)
WBC: 5.2 10*3/uL (ref 4.0–10.0)

## 2008-02-01 LAB — TECHNOLOGIST REVIEW CHCC SATELLITE

## 2008-02-05 LAB — CBC WITH DIFFERENTIAL (CANCER CENTER ONLY)
BASO%: 0.7 % (ref 0.0–2.0)
EOS%: 8.7 % — ABNORMAL HIGH (ref 0.0–7.0)
Eosinophils Absolute: 0.4 10*3/uL (ref 0.0–0.5)
MCH: 28.7 pg (ref 28.0–33.4)
MCHC: 33.4 g/dL (ref 32.0–35.9)
MONO%: 12.4 % (ref 0.0–13.0)
NEUT#: 1.9 10*3/uL (ref 1.5–6.5)
Platelets: 188 10*3/uL (ref 145–400)
RBC: 5.01 10*6/uL (ref 4.20–5.70)
RDW: 14.9 % — ABNORMAL HIGH (ref 10.5–14.6)

## 2008-02-15 LAB — CBC WITH DIFFERENTIAL (CANCER CENTER ONLY)
BASO#: 0.1 10*3/uL (ref 0.0–0.2)
BASO%: 0.9 % (ref 0.0–2.0)
LYMPH%: 37.3 % (ref 14.0–48.0)
MCH: 28.7 pg (ref 28.0–33.4)
MCHC: 33.5 g/dL (ref 32.0–35.9)
MONO#: 1.1 10*3/uL — ABNORMAL HIGH (ref 0.1–0.9)
NEUT%: 32.6 % — ABNORMAL LOW (ref 40.0–80.0)
RBC: 4.87 10*6/uL (ref 4.20–5.70)
RDW: 14.4 % (ref 10.5–14.6)
WBC: 6.4 10*3/uL (ref 4.0–10.0)

## 2008-02-15 LAB — CHCC SATELLITE - SMEAR

## 2008-02-15 LAB — TECHNOLOGIST REVIEW CHCC SATELLITE

## 2008-02-22 LAB — CBC WITH DIFFERENTIAL (CANCER CENTER ONLY)
BASO#: 0.1 10*3/uL (ref 0.0–0.2)
BASO%: 1 % (ref 0.0–2.0)
EOS%: 4.7 % (ref 0.0–7.0)
HCT: 42.4 % (ref 38.7–49.9)
HGB: 14 g/dL (ref 13.0–17.1)
LYMPH#: 2.5 10*3/uL (ref 0.9–3.3)
LYMPH%: 31.8 % (ref 14.0–48.0)
MCH: 28.9 pg (ref 28.0–33.4)
MCHC: 33.1 g/dL (ref 32.0–35.9)
MONO%: 20.1 % — ABNORMAL HIGH (ref 0.0–13.0)
NEUT%: 42.4 % (ref 40.0–80.0)
RDW: 13.3 % (ref 10.5–14.6)

## 2008-02-29 LAB — CBC WITH DIFFERENTIAL (CANCER CENTER ONLY)
BASO#: 0.1 10*3/uL (ref 0.0–0.2)
BASO%: 1.1 % (ref 0.0–2.0)
EOS%: 6.1 % (ref 0.0–7.0)
HGB: 14.3 g/dL (ref 13.0–17.1)
LYMPH#: 2.3 10*3/uL (ref 0.9–3.3)
MCH: 29.3 pg (ref 28.0–33.4)
MCHC: 33.7 g/dL (ref 32.0–35.9)
MONO%: 20.1 % — ABNORMAL HIGH (ref 0.0–13.0)
NEUT#: 2.5 10*3/uL (ref 1.5–6.5)
Platelets: 50 10*3/uL — ABNORMAL LOW (ref 145–400)
RDW: 12.7 % (ref 10.5–14.6)

## 2008-03-04 LAB — CBC WITH DIFFERENTIAL (CANCER CENTER ONLY)
BASO#: 0.1 10*3/uL (ref 0.0–0.2)
Eosinophils Absolute: 0.7 10*3/uL — ABNORMAL HIGH (ref 0.0–0.5)
HGB: 14.3 g/dL (ref 13.0–17.1)
LYMPH#: 2.8 10*3/uL (ref 0.9–3.3)
MCH: 29.5 pg (ref 28.0–33.4)
MONO%: 22.2 % — ABNORMAL HIGH (ref 0.0–13.0)
NEUT#: 2.7 10*3/uL (ref 1.5–6.5)
Platelets: <6 10*3/uL — CL (ref 145–400)
RBC: 4.84 10*6/uL (ref 4.20–5.70)
WBC: 8 10*3/uL (ref 4.0–10.0)

## 2008-03-07 LAB — CBC WITH DIFFERENTIAL (CANCER CENTER ONLY)
BASO#: 0 10*3/uL (ref 0.0–0.2)
Eosinophils Absolute: 0.1 10*3/uL (ref 0.0–0.5)
HGB: 14.3 g/dL (ref 13.0–17.1)
LYMPH%: 10.5 % — ABNORMAL LOW (ref 14.0–48.0)
MCV: 87 fL (ref 82–98)
MONO#: 0.1 10*3/uL (ref 0.1–0.9)
NEUT#: 8.3 10*3/uL — ABNORMAL HIGH (ref 1.5–6.5)
Platelets: 161 10*3/uL (ref 145–400)
RBC: 4.85 10*6/uL (ref 4.20–5.70)
WBC: 9.5 10*3/uL (ref 4.0–10.0)

## 2008-03-07 LAB — TECHNOLOGIST REVIEW CHCC SATELLITE

## 2008-03-14 LAB — CBC WITH DIFFERENTIAL (CANCER CENTER ONLY)
BASO#: 0.1 10*3/uL (ref 0.0–0.2)
EOS%: 7.5 % — ABNORMAL HIGH (ref 0.0–7.0)
HCT: 40.6 % (ref 38.7–49.9)
HGB: 14 g/dL (ref 13.0–17.1)
LYMPH#: 3 10*3/uL (ref 0.9–3.3)
LYMPH%: 31.2 % (ref 14.0–48.0)
MCH: 29.9 pg (ref 28.0–33.4)
MCHC: 34.4 g/dL (ref 32.0–35.9)
MCV: 87 fL (ref 82–98)
NEUT%: 46.7 % (ref 40.0–80.0)

## 2008-03-14 LAB — TECHNOLOGIST REVIEW CHCC SATELLITE

## 2008-03-15 ENCOUNTER — Ambulatory Visit: Payer: Self-pay | Admitting: Hematology & Oncology

## 2008-03-21 LAB — CBC WITH DIFFERENTIAL (CANCER CENTER ONLY)
BASO%: 0.7 % (ref 0.0–2.0)
EOS%: 7.3 % — ABNORMAL HIGH (ref 0.0–7.0)
HCT: 43.7 % (ref 38.7–49.9)
LYMPH#: 2 10*3/uL (ref 0.9–3.3)
MCHC: 33.6 g/dL (ref 32.0–35.9)
NEUT#: 3.8 10*3/uL (ref 1.5–6.5)
NEUT%: 52.2 % (ref 40.0–80.0)
Platelets: 194 10*3/uL (ref 145–400)
RDW: 12.3 % (ref 10.5–14.6)

## 2008-03-21 LAB — CHCC SATELLITE - SMEAR

## 2008-03-28 LAB — CBC WITH DIFFERENTIAL (CANCER CENTER ONLY)
BASO%: 1.2 % (ref 0.0–2.0)
EOS%: 7.6 % — ABNORMAL HIGH (ref 0.0–7.0)
HCT: 43.6 % (ref 38.7–49.9)
LYMPH%: 36 % (ref 14.0–48.0)
MCH: 29.1 pg (ref 28.0–33.4)
MCHC: 33.7 g/dL (ref 32.0–35.9)
MCV: 87 fL (ref 82–98)
MONO%: 15.5 % — ABNORMAL HIGH (ref 0.0–13.0)
NEUT%: 39.7 % — ABNORMAL LOW (ref 40.0–80.0)
RDW: 12.6 % (ref 10.5–14.6)

## 2008-04-11 LAB — CBC WITH DIFFERENTIAL (CANCER CENTER ONLY)
BASO#: 0.1 10*3/uL (ref 0.0–0.2)
Eosinophils Absolute: 1.2 10*3/uL — ABNORMAL HIGH (ref 0.0–0.5)
HGB: 15.2 g/dL (ref 13.0–17.1)
LYMPH%: 36.2 % (ref 14.0–48.0)
MCH: 29.4 pg (ref 28.0–33.4)
MCV: 87 fL (ref 82–98)
MONO%: 13.6 % — ABNORMAL HIGH (ref 0.0–13.0)
RBC: 5.19 10*6/uL (ref 4.20–5.70)

## 2008-04-19 LAB — CBC WITH DIFFERENTIAL (CANCER CENTER ONLY)
BASO%: 1.2 % (ref 0.0–2.0)
Eosinophils Absolute: 0.8 10*3/uL — ABNORMAL HIGH (ref 0.0–0.5)
HCT: 45.7 % (ref 38.7–49.9)
HGB: 15.4 g/dL (ref 13.0–17.1)
LYMPH#: 3 10*3/uL (ref 0.9–3.3)
LYMPH%: 34.9 % (ref 14.0–48.0)
MCV: 87 fL (ref 82–98)
MONO#: 1.3 10*3/uL — ABNORMAL HIGH (ref 0.1–0.9)
NEUT%: 39.7 % — ABNORMAL LOW (ref 40.0–80.0)
RBC: 5.27 10*6/uL (ref 4.20–5.70)
RDW: 12.9 % (ref 10.5–14.6)
WBC: 8.7 10*3/uL (ref 4.0–10.0)

## 2008-04-19 LAB — TECHNOLOGIST REVIEW CHCC SATELLITE

## 2008-04-19 LAB — CHCC SATELLITE - SMEAR

## 2008-04-27 LAB — CBC WITH DIFFERENTIAL (CANCER CENTER ONLY)
Eosinophils Absolute: 0.6 10*3/uL — ABNORMAL HIGH (ref 0.0–0.5)
HCT: 46.3 % (ref 38.7–49.9)
LYMPH%: 42.4 % (ref 14.0–48.0)
MCH: 28.8 pg (ref 28.0–33.4)
MCV: 86 fL (ref 82–98)
MONO#: 1 10*3/uL — ABNORMAL HIGH (ref 0.1–0.9)
MONO%: 13.6 % — ABNORMAL HIGH (ref 0.0–13.0)
NEUT%: 34.4 % — ABNORMAL LOW (ref 40.0–80.0)
RBC: 5.37 10*6/uL (ref 4.20–5.70)
WBC: 7 10*3/uL (ref 4.0–10.0)

## 2008-05-02 ENCOUNTER — Ambulatory Visit: Payer: Self-pay | Admitting: Hematology & Oncology

## 2008-05-02 LAB — CBC WITH DIFFERENTIAL (CANCER CENTER ONLY)
BASO#: 0.1 10*3/uL (ref 0.0–0.2)
Eosinophils Absolute: 0.6 10*3/uL — ABNORMAL HIGH (ref 0.0–0.5)
HGB: 15.6 g/dL (ref 13.0–17.1)
LYMPH#: 2.9 10*3/uL (ref 0.9–3.3)
MCH: 29.3 pg (ref 28.0–33.4)
NEUT#: 3.1 10*3/uL (ref 1.5–6.5)
RBC: 5.32 10*6/uL (ref 4.20–5.70)

## 2008-05-09 LAB — CBC WITH DIFFERENTIAL (CANCER CENTER ONLY)
BASO#: 0.1 10*3/uL (ref 0.0–0.2)
BASO%: 0.8 % (ref 0.0–2.0)
EOS%: 12.4 % — ABNORMAL HIGH (ref 0.0–7.0)
HGB: 14.9 g/dL (ref 13.0–17.1)
LYMPH#: 2.3 10*3/uL (ref 0.9–3.3)
MCHC: 34.4 g/dL (ref 32.0–35.9)
NEUT#: 4.2 10*3/uL (ref 1.5–6.5)

## 2008-05-17 LAB — CBC WITH DIFFERENTIAL (CANCER CENTER ONLY)
BASO#: 0.1 10*3/uL (ref 0.0–0.2)
EOS%: 3.4 % (ref 0.0–7.0)
Eosinophils Absolute: 0.3 10*3/uL (ref 0.0–0.5)
HCT: 43.5 % (ref 38.7–49.9)
HGB: 14.6 g/dL (ref 13.0–17.1)
MCH: 28.8 pg (ref 28.0–33.4)
MCHC: 33.5 g/dL (ref 32.0–35.9)
MCV: 86 fL (ref 82–98)
MONO%: 10.1 % (ref 0.0–13.0)
NEUT%: 46.7 % (ref 40.0–80.0)

## 2008-05-23 LAB — CBC WITH DIFFERENTIAL (CANCER CENTER ONLY)
BASO%: 0.8 % (ref 0.0–2.0)
EOS%: 5.5 % (ref 0.0–7.0)
HCT: 43.4 % (ref 38.7–49.9)
HGB: 14.7 g/dL (ref 13.0–17.1)
LYMPH#: 3 10*3/uL (ref 0.9–3.3)
MCHC: 33.8 g/dL (ref 32.0–35.9)
MONO#: 0.7 10*3/uL (ref 0.1–0.9)
NEUT#: 3.5 10*3/uL (ref 1.5–6.5)
NEUT%: 45.3 % (ref 40.0–80.0)
RDW: 13.1 % (ref 10.5–14.6)
WBC: 7.6 10*3/uL (ref 4.0–10.0)

## 2008-05-30 LAB — CBC WITH DIFFERENTIAL (CANCER CENTER ONLY)
BASO%: 1.1 % (ref 0.0–2.0)
EOS%: 7.8 % — ABNORMAL HIGH (ref 0.0–7.0)
LYMPH#: 3 10*3/uL (ref 0.9–3.3)
LYMPH%: 36.9 % (ref 14.0–48.0)
MCHC: 32.7 g/dL (ref 32.0–35.9)
MONO#: 1.1 10*3/uL — ABNORMAL HIGH (ref 0.1–0.9)
NEUT%: 40.4 % (ref 40.0–80.0)
Platelets: 257 10*3/uL (ref 145–400)
RDW: 13.3 % (ref 10.5–14.6)
WBC: 8.2 10*3/uL (ref 4.0–10.0)

## 2008-05-30 LAB — CHCC SATELLITE - SMEAR

## 2008-06-06 LAB — CBC WITH DIFFERENTIAL (CANCER CENTER ONLY)
BASO%: 0.8 % (ref 0.0–2.0)
EOS%: 7.5 % — ABNORMAL HIGH (ref 0.0–7.0)
Eosinophils Absolute: 0.7 10*3/uL — ABNORMAL HIGH (ref 0.0–0.5)
LYMPH%: 38.8 % (ref 14.0–48.0)
MCH: 29.5 pg (ref 28.0–33.4)
MCV: 89 fL (ref 82–98)
MONO%: 14.8 % — ABNORMAL HIGH (ref 0.0–13.0)
NEUT#: 3.5 10*3/uL (ref 1.5–6.5)
Platelets: 187 10*3/uL (ref 145–400)
RBC: 4.76 10*6/uL (ref 4.20–5.70)
RDW: 13 % (ref 10.5–14.6)
WBC: 9.1 10*3/uL (ref 4.0–10.0)

## 2008-06-10 LAB — CBC WITH DIFFERENTIAL (CANCER CENTER ONLY)
BASO%: 0.7 % (ref 0.0–2.0)
Eosinophils Absolute: 0.4 10*3/uL (ref 0.0–0.5)
LYMPH#: 2.4 10*3/uL (ref 0.9–3.3)
LYMPH%: 36.9 % (ref 14.0–48.0)
MCV: 89 fL (ref 82–98)
MONO#: 0.8 10*3/uL (ref 0.1–0.9)
NEUT#: 3 10*3/uL (ref 1.5–6.5)
Platelets: 166 10*3/uL (ref 145–400)
RBC: 5.05 10*6/uL (ref 4.20–5.70)
RDW: 13.2 % (ref 10.5–14.6)
WBC: 6.6 10*3/uL (ref 4.0–10.0)

## 2008-06-20 ENCOUNTER — Ambulatory Visit: Payer: Self-pay | Admitting: Hematology & Oncology

## 2008-06-20 LAB — CBC WITH DIFFERENTIAL (CANCER CENTER ONLY)
BASO#: 0.1 10*3/uL (ref 0.0–0.2)
Eosinophils Absolute: 0.6 10*3/uL — ABNORMAL HIGH (ref 0.0–0.5)
HGB: 15.1 g/dL (ref 13.0–17.1)
MCH: 29.4 pg (ref 28.0–33.4)
MCV: 89 fL (ref 82–98)
MONO#: 1.1 10*3/uL — ABNORMAL HIGH (ref 0.1–0.9)
MONO%: 13.2 % — ABNORMAL HIGH (ref 0.0–13.0)
NEUT#: 3.6 10*3/uL (ref 1.5–6.5)
RBC: 5.13 10*6/uL (ref 4.20–5.70)

## 2008-06-27 LAB — CBC WITH DIFFERENTIAL (CANCER CENTER ONLY)
BASO#: 0.1 10*3/uL (ref 0.0–0.2)
Eosinophils Absolute: 0.6 10*3/uL — ABNORMAL HIGH (ref 0.0–0.5)
HCT: 45 % (ref 38.7–49.9)
LYMPH%: 37.7 % (ref 14.0–48.0)
MCH: 29.8 pg (ref 28.0–33.4)
MCV: 89 fL (ref 82–98)
MONO%: 10.2 % (ref 0.0–13.0)
NEUT%: 43.9 % (ref 40.0–80.0)
Platelets: 225 10*3/uL (ref 145–400)
RBC: 5.03 10*6/uL (ref 4.20–5.70)

## 2008-06-30 ENCOUNTER — Encounter: Admission: RE | Admit: 2008-06-30 | Discharge: 2008-06-30 | Payer: Self-pay | Admitting: Family Medicine

## 2008-07-04 LAB — CBC WITH DIFFERENTIAL (CANCER CENTER ONLY)
BASO%: 1.2 % (ref 0.0–2.0)
HCT: 47.5 % (ref 38.7–49.9)
LYMPH%: 39.8 % (ref 14.0–48.0)
MCH: 29.7 pg (ref 28.0–33.4)
MCHC: 33.3 g/dL (ref 32.0–35.9)
MCV: 89 fL (ref 82–98)
MONO%: 12.8 % (ref 0.0–13.0)
NEUT%: 40.8 % (ref 40.0–80.0)
Platelets: 127 10*3/uL — ABNORMAL LOW (ref 145–400)
RDW: 12.6 % (ref 10.5–14.6)
WBC: 8.3 10*3/uL (ref 4.0–10.0)

## 2008-07-11 LAB — CBC WITH DIFFERENTIAL (CANCER CENTER ONLY)
BASO#: 0 10*3/uL (ref 0.0–0.2)
EOS%: 5.5 % (ref 0.0–7.0)
LYMPH%: 39.3 % (ref 14.0–48.0)
MCH: 29.9 pg (ref 28.0–33.4)
MCHC: 33.3 g/dL (ref 32.0–35.9)
MCV: 90 fL (ref 82–98)
MONO%: 14.1 % — ABNORMAL HIGH (ref 0.0–13.0)
NEUT#: 2.6 10*3/uL (ref 1.5–6.5)
Platelets: 167 10*3/uL (ref 145–400)

## 2008-07-18 LAB — CBC WITH DIFFERENTIAL (CANCER CENTER ONLY)
BASO%: 0.7 % (ref 0.0–2.0)
HCT: 43.7 % (ref 38.7–49.9)
LYMPH#: 2.7 10*3/uL (ref 0.9–3.3)
MONO#: 1 10*3/uL — ABNORMAL HIGH (ref 0.1–0.9)
NEUT#: 5.3 10*3/uL (ref 1.5–6.5)
NEUT%: 54.5 % (ref 40.0–80.0)
RDW: 12.6 % (ref 10.5–14.6)
WBC: 9.8 10*3/uL (ref 4.0–10.0)

## 2008-07-25 LAB — CBC WITH DIFFERENTIAL (CANCER CENTER ONLY)
BASO#: 0.1 10*3/uL (ref 0.0–0.2)
EOS%: 5.6 % (ref 0.0–7.0)
Eosinophils Absolute: 0.5 10*3/uL (ref 0.0–0.5)
HGB: 14.8 g/dL (ref 13.0–17.1)
LYMPH#: 3 10*3/uL (ref 0.9–3.3)
NEUT#: 3.7 10*3/uL (ref 1.5–6.5)
RBC: 5.01 10*6/uL (ref 4.20–5.70)

## 2008-07-25 LAB — TECHNOLOGIST REVIEW CHCC SATELLITE

## 2008-08-01 LAB — CBC WITH DIFFERENTIAL (CANCER CENTER ONLY)
BASO#: 0 10e3/uL (ref 0.0–0.2)
BASO%: 0.6 % (ref 0.0–2.0)
EOS%: 7.5 % — ABNORMAL HIGH (ref 0.0–7.0)
Eosinophils Absolute: 0.5 10e3/uL (ref 0.0–0.5)
HCT: 45.2 % (ref 38.7–49.9)
HGB: 15.2 g/dL (ref 13.0–17.1)
LYMPH#: 2.2 10e3/uL (ref 0.9–3.3)
LYMPH%: 32.5 % (ref 14.0–48.0)
MCH: 29.7 pg (ref 28.0–33.4)
MCHC: 33.7 g/dL (ref 32.0–35.9)
MCV: 88 fL (ref 82–98)
MONO#: 0.7 10e3/uL (ref 0.1–0.9)
MONO%: 10.5 % (ref 0.0–13.0)
NEUT#: 3.3 10e3/uL (ref 1.5–6.5)
NEUT%: 48.9 % (ref 40.0–80.0)
Platelets: 140 10e3/uL — ABNORMAL LOW (ref 145–400)
RBC: 5.12 10e6/uL (ref 4.20–5.70)
RDW: 12.7 % (ref 10.5–14.6)
WBC: 6.7 10e3/uL (ref 4.0–10.0)

## 2008-08-05 ENCOUNTER — Ambulatory Visit: Payer: Self-pay | Admitting: Hematology & Oncology

## 2008-09-05 LAB — CBC WITH DIFFERENTIAL (CANCER CENTER ONLY)
BASO%: 0.8 % (ref 0.0–2.0)
LYMPH#: 2.7 10*3/uL (ref 0.9–3.3)
LYMPH%: 33.7 % (ref 14.0–48.0)
MCV: 89 fL (ref 82–98)
MONO#: 1.1 10*3/uL — ABNORMAL HIGH (ref 0.1–0.9)
Platelets: 171 10*3/uL (ref 145–400)
RDW: 11.9 % (ref 10.5–14.6)
WBC: 8.1 10*3/uL (ref 4.0–10.0)

## 2008-09-29 ENCOUNTER — Ambulatory Visit: Payer: Self-pay | Admitting: Hematology & Oncology

## 2008-09-29 LAB — CBC WITH DIFFERENTIAL (CANCER CENTER ONLY)
BASO%: 0.8 % (ref 0.0–2.0)
LYMPH%: 31.9 % (ref 14.0–48.0)
MCH: 30.3 pg (ref 28.0–33.4)
MCV: 90 fL (ref 82–98)
MONO%: 15.2 % — ABNORMAL HIGH (ref 0.0–13.0)
Platelets: 158 10*3/uL (ref 145–400)
RDW: 12.7 % (ref 10.5–14.6)

## 2008-10-24 LAB — CBC WITH DIFFERENTIAL (CANCER CENTER ONLY)
BASO#: 0 10*3/uL (ref 0.0–0.2)
Eosinophils Absolute: 0.3 10*3/uL (ref 0.0–0.5)
HGB: 15.3 g/dL (ref 13.0–17.1)
LYMPH%: 35.4 % (ref 14.0–48.0)
MCH: 30.1 pg (ref 28.0–33.4)
MCV: 91 fL (ref 82–98)
MONO%: 13.5 % — ABNORMAL HIGH (ref 0.0–13.0)
Platelets: 128 10*3/uL — ABNORMAL LOW (ref 145–400)
RBC: 5.08 10*6/uL (ref 4.20–5.70)

## 2008-11-28 ENCOUNTER — Ambulatory Visit: Payer: Self-pay | Admitting: Hematology & Oncology

## 2008-11-28 LAB — CBC WITH DIFFERENTIAL (CANCER CENTER ONLY)
BASO#: 0.2 10*3/uL (ref 0.0–0.2)
EOS%: 5.6 % (ref 0.0–7.0)
HGB: 15 g/dL (ref 13.0–17.1)
LYMPH%: 34.9 % (ref 14.0–48.0)
MCH: 30.1 pg (ref 28.0–33.4)
MCHC: 33.4 g/dL (ref 32.0–35.9)
MCV: 90 fL (ref 82–98)
MONO%: 14.5 % — ABNORMAL HIGH (ref 0.0–13.0)
NEUT#: 4.4 10*3/uL (ref 1.5–6.5)
Platelets: 138 10*3/uL — ABNORMAL LOW (ref 145–400)

## 2008-12-26 LAB — CBC WITH DIFFERENTIAL (CANCER CENTER ONLY)
BASO#: 0.1 10*3/uL (ref 0.0–0.2)
EOS%: 7.7 % — ABNORMAL HIGH (ref 0.0–7.0)
Eosinophils Absolute: 0.6 10*3/uL — ABNORMAL HIGH (ref 0.0–0.5)
HCT: 48 % (ref 38.7–49.9)
HGB: 16.1 g/dL (ref 13.0–17.1)
MCH: 30.7 pg (ref 28.0–33.4)
MCHC: 33.6 g/dL (ref 32.0–35.9)
MONO%: 10.2 % (ref 0.0–13.0)
NEUT%: 34.9 % — ABNORMAL LOW (ref 40.0–80.0)

## 2009-01-27 ENCOUNTER — Ambulatory Visit: Payer: Self-pay | Admitting: Hematology & Oncology

## 2009-01-30 LAB — CBC WITH DIFFERENTIAL (CANCER CENTER ONLY)
BASO#: 0.1 10*3/uL (ref 0.0–0.2)
Eosinophils Absolute: 0.3 10*3/uL (ref 0.0–0.5)
HCT: 46.1 % (ref 38.7–49.9)
HGB: 15.6 g/dL (ref 13.0–17.1)
LYMPH#: 2.7 10*3/uL (ref 0.9–3.3)
MONO#: 0.7 10*3/uL (ref 0.1–0.9)
NEUT%: 42 % (ref 40.0–80.0)
RBC: 5.12 10*6/uL (ref 4.20–5.70)
WBC: 6.5 10*3/uL (ref 4.0–10.0)

## 2009-01-30 LAB — CHCC SATELLITE - SMEAR

## 2009-02-17 ENCOUNTER — Ambulatory Visit: Payer: Self-pay | Admitting: Hematology & Oncology

## 2009-02-17 LAB — CBC WITH DIFFERENTIAL/PLATELET
BASO%: 0.6 % (ref 0.0–2.0)
HCT: 43.1 % (ref 38.4–49.9)
HGB: 15 g/dL (ref 13.0–17.1)
MCHC: 34.8 g/dL (ref 32.0–36.0)
MONO#: 1.4 10*3/uL — ABNORMAL HIGH (ref 0.1–0.9)
NEUT%: 29.1 % — ABNORMAL LOW (ref 39.0–75.0)
WBC: 7.9 10*3/uL (ref 4.0–10.3)
lymph#: 3.8 10*3/uL — ABNORMAL HIGH (ref 0.9–3.3)

## 2009-02-20 LAB — CBC WITH DIFFERENTIAL (CANCER CENTER ONLY)
BASO%: 0.9 % (ref 0.0–2.0)
LYMPH%: 9.2 % — ABNORMAL LOW (ref 14.0–48.0)
MCV: 87 fL (ref 82–98)
MONO#: 1.4 10*3/uL — ABNORMAL HIGH (ref 0.1–0.9)
Platelets: 156 10*3/uL (ref 145–400)
RDW: 12.5 % (ref 10.5–14.6)
WBC: 20.3 10*3/uL — ABNORMAL HIGH (ref 4.0–10.0)

## 2009-02-20 LAB — TECHNOLOGIST REVIEW CHCC SATELLITE

## 2009-03-02 ENCOUNTER — Ambulatory Visit: Payer: Self-pay | Admitting: Hematology & Oncology

## 2009-04-02 ENCOUNTER — Ambulatory Visit: Payer: Self-pay | Admitting: Oncology

## 2009-04-02 ENCOUNTER — Inpatient Hospital Stay (HOSPITAL_COMMUNITY): Admission: EM | Admit: 2009-04-02 | Discharge: 2009-04-04 | Payer: Self-pay | Admitting: Emergency Medicine

## 2009-04-03 ENCOUNTER — Ambulatory Visit: Payer: Self-pay | Admitting: Hematology & Oncology

## 2009-04-10 ENCOUNTER — Ambulatory Visit: Payer: Self-pay | Admitting: Hematology & Oncology

## 2009-04-12 LAB — CBC WITH DIFFERENTIAL (CANCER CENTER ONLY)
BASO%: 1.1 % (ref 0.0–2.0)
HCT: 40.4 % (ref 38.7–49.9)
LYMPH#: 3.7 10*3/uL — ABNORMAL HIGH (ref 0.9–3.3)
MONO#: 1.5 10*3/uL — ABNORMAL HIGH (ref 0.1–0.9)
NEUT%: 41.1 % (ref 40.0–80.0)
Platelets: 330 10*3/uL (ref 145–400)
RDW: 13.1 % (ref 10.5–14.6)
WBC: 10.1 10*3/uL — ABNORMAL HIGH (ref 4.0–10.0)

## 2009-04-12 LAB — TECHNOLOGIST REVIEW CHCC SATELLITE

## 2009-04-17 ENCOUNTER — Ambulatory Visit (HOSPITAL_COMMUNITY): Admission: RE | Admit: 2009-04-17 | Discharge: 2009-04-17 | Payer: Self-pay | Admitting: Orthopedic Surgery

## 2009-04-19 LAB — CBC WITH DIFFERENTIAL (CANCER CENTER ONLY)
BASO#: 0.1 10*3/uL (ref 0.0–0.2)
Eosinophils Absolute: 0.2 10*3/uL (ref 0.0–0.5)
HCT: 45.5 % (ref 38.7–49.9)
HGB: 15.3 g/dL (ref 13.0–17.1)
LYMPH#: 2.1 10*3/uL (ref 0.9–3.3)
MCHC: 33.7 g/dL (ref 32.0–35.9)
MONO#: 0.8 10*3/uL (ref 0.1–0.9)
NEUT#: 6.6 10*3/uL — ABNORMAL HIGH (ref 1.5–6.5)
NEUT%: 67.4 % (ref 40.0–80.0)
RBC: 4.97 10*6/uL (ref 4.20–5.70)
WBC: 9.8 10*3/uL (ref 4.0–10.0)

## 2009-04-24 LAB — HYPERCOAGULABLE PANEL, COMPREHENSIVE
Anticardiolipin IgA: 4 APL U/mL (ref <10)
Anticardiolipin IgM: <10 MPL U/mL (ref <10)
Beta-2-Glycoprotein I IgM: <3 U/mL (ref <=15)
DRVVT: 103.4 secs — ABNORMAL HIGH (ref 34.7–40.5)
Drvvt confirmation: 2.27 Ratio — ABNORMAL HIGH (ref <1.18)
Lupus Anticoagulant: DETECTED
PTTLA 4:1 Mix: 82.8 secs — ABNORMAL HIGH (ref 36.3–48.8)
PTTLA Confirmation: 30.2 secs — ABNORMAL HIGH (ref <8.0)
Protein C, Total: 122 % (ref 70–140)
Protein S Activity: 112 % (ref 69–129)

## 2009-05-05 LAB — HEXAGONAL PHOSPHOLIPID NEUTRALIZATION: Hex Phosph Neut Test: POSITIVE — AB

## 2009-05-10 ENCOUNTER — Ambulatory Visit: Payer: Self-pay | Admitting: Hematology & Oncology

## 2009-05-10 LAB — CBC WITH DIFFERENTIAL (CANCER CENTER ONLY)
BASO%: 1.1 % (ref 0.0–2.0)
Eosinophils Absolute: 0.3 10*3/uL (ref 0.0–0.5)
HCT: 43.4 % (ref 38.7–49.9)
LYMPH#: 2.8 10*3/uL (ref 0.9–3.3)
MCV: 91 fL (ref 82–98)
MONO#: 1.6 10*3/uL — ABNORMAL HIGH (ref 0.1–0.9)
NEUT%: 40.9 % (ref 40.0–80.0)
Platelets: 496 10*3/uL — ABNORMAL HIGH (ref 145–400)
RBC: 4.75 10*6/uL (ref 4.20–5.70)
RDW: 12.8 % (ref 10.5–14.6)
WBC: 8.3 10*3/uL (ref 4.0–10.0)

## 2009-05-10 LAB — PROTIME-INR (CHCC SATELLITE): Protime: 13.2 Seconds (ref 10.6–13.4)

## 2009-05-10 LAB — CHCC SATELLITE - SMEAR

## 2009-05-10 LAB — TECHNOLOGIST REVIEW CHCC SATELLITE

## 2009-05-17 LAB — CBC WITH DIFFERENTIAL (CANCER CENTER ONLY)
BASO#: 0.1 10*3/uL (ref 0.0–0.2)
BASO%: 0.8 % (ref 0.0–2.0)
EOS%: 2.5 % (ref 0.0–7.0)
HGB: 14 g/dL (ref 13.0–17.1)
LYMPH#: 3.2 10*3/uL (ref 0.9–3.3)
MCH: 30.6 pg (ref 28.0–33.4)
MCHC: 33.7 g/dL (ref 32.0–35.9)
MONO%: 11.3 % (ref 0.0–13.0)
NEUT#: 3.9 10*3/uL (ref 1.5–6.5)
Platelets: 260 10*3/uL (ref 145–400)
RDW: 13 % (ref 10.5–14.6)

## 2009-05-24 LAB — CBC WITH DIFFERENTIAL (CANCER CENTER ONLY)
BASO#: 0.1 10*3/uL (ref 0.0–0.2)
BASO%: 0.9 % (ref 0.0–2.0)
EOS%: 5.3 % (ref 0.0–7.0)
Eosinophils Absolute: 0.5 10*3/uL (ref 0.0–0.5)
HGB: 13.8 g/dL (ref 13.0–17.1)
LYMPH#: 3.4 10*3/uL — ABNORMAL HIGH (ref 0.9–3.3)
NEUT#: 4.5 10*3/uL (ref 1.5–6.5)
Platelets: 138 10*3/uL — ABNORMAL LOW (ref 145–400)
RBC: 4.55 10*6/uL (ref 4.20–5.70)
WBC: 9.9 10*3/uL (ref 4.0–10.0)

## 2009-05-29 LAB — CBC WITH DIFFERENTIAL (CANCER CENTER ONLY)
BASO#: 0.1 10*3/uL (ref 0.0–0.2)
EOS%: 5.2 % (ref 0.0–7.0)
HGB: 14.5 g/dL (ref 13.0–17.1)
MCH: 30.9 pg (ref 28.0–33.4)
MCHC: 34.1 g/dL (ref 32.0–35.9)
MONO%: 9.8 % (ref 0.0–13.0)
NEUT#: 4.4 10*3/uL (ref 1.5–6.5)

## 2009-05-29 LAB — TECHNOLOGIST REVIEW CHCC SATELLITE

## 2009-06-09 ENCOUNTER — Ambulatory Visit: Payer: Self-pay | Admitting: Hematology & Oncology

## 2009-06-09 LAB — CBC WITH DIFFERENTIAL (CANCER CENTER ONLY)
BASO%: 0.9 % (ref 0.0–2.0)
EOS%: 5.4 % (ref 0.0–7.0)
HCT: 43.2 % (ref 38.7–49.9)
LYMPH#: 3.5 10*3/uL — ABNORMAL HIGH (ref 0.9–3.3)
LYMPH%: 38 % (ref 14.0–48.0)
MCH: 30.5 pg (ref 28.0–33.4)
MCHC: 33.3 g/dL (ref 32.0–35.9)
MCV: 92 fL (ref 82–98)
MONO%: 10.4 % (ref 0.0–13.0)
NEUT%: 45.3 % (ref 40.0–80.0)
RDW: 12.7 % (ref 10.5–14.6)

## 2009-06-16 LAB — CBC WITH DIFFERENTIAL (CANCER CENTER ONLY)
BASO#: 0.1 10*3/uL (ref 0.0–0.2)
Eosinophils Absolute: 0.4 10*3/uL (ref 0.0–0.5)
HGB: 14.1 g/dL (ref 13.0–17.1)
LYMPH#: 3.3 10*3/uL (ref 0.9–3.3)
MONO#: 0.9 10*3/uL (ref 0.1–0.9)
NEUT#: 3.6 10*3/uL (ref 1.5–6.5)
Platelets: 240 10*3/uL (ref 145–400)
RBC: 4.67 10*6/uL (ref 4.20–5.70)
WBC: 8.2 10*3/uL (ref 4.0–10.0)

## 2009-06-16 LAB — TECHNOLOGIST REVIEW CHCC SATELLITE

## 2009-06-23 LAB — CBC WITH DIFFERENTIAL (CANCER CENTER ONLY)
BASO%: 0.9 % (ref 0.0–2.0)
Eosinophils Absolute: 0.6 10*3/uL — ABNORMAL HIGH (ref 0.0–0.5)
HCT: 45.2 % (ref 38.7–49.9)
LYMPH#: 3.9 10*3/uL — ABNORMAL HIGH (ref 0.9–3.3)
LYMPH%: 37.2 % (ref 14.0–48.0)
MCV: 92 fL (ref 82–98)
MONO#: 0.9 10*3/uL (ref 0.1–0.9)
NEUT%: 47.4 % (ref 40.0–80.0)
RBC: 4.92 10*6/uL (ref 4.20–5.70)
RDW: 12.2 % (ref 10.5–14.6)
WBC: 10.5 10*3/uL — ABNORMAL HIGH (ref 4.0–10.0)

## 2009-06-23 LAB — CHCC SATELLITE - SMEAR

## 2009-06-30 LAB — CBC WITH DIFFERENTIAL (CANCER CENTER ONLY)
BASO#: 0.1 10*3/uL (ref 0.0–0.2)
Eosinophils Absolute: 0.6 10*3/uL — ABNORMAL HIGH (ref 0.0–0.5)
HCT: 42.4 % (ref 38.7–49.9)
HGB: 14 g/dL (ref 13.0–17.1)
LYMPH#: 3.3 10*3/uL (ref 0.9–3.3)
MCHC: 32.9 g/dL (ref 32.0–35.9)
NEUT#: 3.9 10*3/uL (ref 1.5–6.5)
NEUT%: 43.4 % (ref 40.0–80.0)
RBC: 4.64 10*6/uL (ref 4.20–5.70)

## 2009-06-30 LAB — CHCC SATELLITE - SMEAR

## 2009-06-30 LAB — TECHNOLOGIST REVIEW CHCC SATELLITE

## 2009-07-13 ENCOUNTER — Ambulatory Visit: Payer: Self-pay | Admitting: Hematology & Oncology

## 2009-07-14 LAB — CBC WITH DIFFERENTIAL (CANCER CENTER ONLY)
BASO#: 0.1 10*3/uL (ref 0.0–0.2)
BASO%: 0.8 % (ref 0.0–2.0)
EOS%: 5.6 % (ref 0.0–7.0)
HGB: 14.5 g/dL (ref 13.0–17.1)
LYMPH#: 2.9 10*3/uL (ref 0.9–3.3)
MCHC: 33.1 g/dL (ref 32.0–35.9)
NEUT#: 3.5 10*3/uL (ref 1.5–6.5)
Platelets: 294 10*3/uL (ref 145–400)

## 2009-07-28 LAB — CBC WITH DIFFERENTIAL (CANCER CENTER ONLY)
BASO#: 0.1 10*3/uL (ref 0.0–0.2)
Eosinophils Absolute: 0.6 10*3/uL — ABNORMAL HIGH (ref 0.0–0.5)
HGB: 15.1 g/dL (ref 13.0–17.1)
LYMPH%: 42.4 % (ref 14.0–48.0)
MCH: 30.8 pg (ref 28.0–33.4)
MCV: 92 fL (ref 82–98)
MONO#: 1.4 10*3/uL — ABNORMAL HIGH (ref 0.1–0.9)
MONO%: 15 % — ABNORMAL HIGH (ref 0.0–13.0)
NEUT#: 3.2 10*3/uL (ref 1.5–6.5)
RBC: 4.91 10*6/uL (ref 4.20–5.70)

## 2009-08-11 LAB — CBC WITH DIFFERENTIAL (CANCER CENTER ONLY)
BASO#: 0.1 10*3/uL (ref 0.0–0.2)
EOS%: 5.7 % (ref 0.0–7.0)
Eosinophils Absolute: 0.4 10*3/uL (ref 0.0–0.5)
HCT: 45.3 % (ref 38.7–49.9)
HGB: 15.1 g/dL (ref 13.0–17.1)
LYMPH#: 3 10*3/uL (ref 0.9–3.3)
MCHC: 33.4 g/dL (ref 32.0–35.9)
NEUT%: 42.8 % (ref 40.0–80.0)
RBC: 4.94 10*6/uL (ref 4.20–5.70)

## 2009-08-11 LAB — COMPREHENSIVE METABOLIC PANEL
AST: 23 U/L (ref 0–37)
Albumin: 4.2 g/dL (ref 3.5–5.2)
BUN: 16 mg/dL (ref 6–23)
CO2: 22 mEq/L (ref 19–32)
Calcium: 9.2 mg/dL (ref 8.4–10.5)
Chloride: 105 mEq/L (ref 96–112)
Creatinine, Ser: 1.17 mg/dL (ref 0.40–1.50)
Glucose, Bld: 107 mg/dL — ABNORMAL HIGH (ref 70–99)

## 2009-08-11 LAB — CHCC SATELLITE - SMEAR

## 2009-08-11 LAB — VITAMIN D 25 HYDROXY (VIT D DEFICIENCY, FRACTURES): Vit D, 25-Hydroxy: 21 ng/mL — ABNORMAL LOW (ref 30–89)

## 2009-08-22 ENCOUNTER — Ambulatory Visit: Payer: Self-pay | Admitting: Hematology & Oncology

## 2009-08-22 LAB — VITAMIN D 25 HYDROXY (VIT D DEFICIENCY, FRACTURES): Vit D, 25-Hydroxy: 23 ng/mL — ABNORMAL LOW (ref 30–89)

## 2009-08-22 LAB — CBC WITH DIFFERENTIAL (CANCER CENTER ONLY)
BASO#: 0.2 10*3/uL (ref 0.0–0.2)
HCT: 45.9 % (ref 38.7–49.9)
HGB: 15.6 g/dL (ref 13.0–17.1)
LYMPH#: 3.6 10*3/uL — ABNORMAL HIGH (ref 0.9–3.3)
MONO#: 1.3 10*3/uL — ABNORMAL HIGH (ref 0.1–0.9)
NEUT%: 44.5 % (ref 40.0–80.0)
WBC: 10.1 10*3/uL — ABNORMAL HIGH (ref 4.0–10.0)

## 2009-08-22 LAB — COMPREHENSIVE METABOLIC PANEL
ALT: 23 U/L (ref 0–53)
BUN: 13 mg/dL (ref 6–23)
CO2: 20 mEq/L (ref 19–32)
Calcium: 8.7 mg/dL (ref 8.4–10.5)
Chloride: 106 mEq/L (ref 96–112)
Creatinine, Ser: 1.22 mg/dL (ref 0.40–1.50)

## 2009-08-22 LAB — CHCC SATELLITE - SMEAR

## 2009-09-15 LAB — CBC WITH DIFFERENTIAL (CANCER CENTER ONLY)
BASO#: 0.1 10*3/uL (ref 0.0–0.2)
Eosinophils Absolute: 0.4 10*3/uL (ref 0.0–0.5)
HCT: 44.6 % (ref 38.7–49.9)
HGB: 15.1 g/dL (ref 13.0–17.1)
LYMPH#: 3.8 10*3/uL — ABNORMAL HIGH (ref 0.9–3.3)
MCHC: 33.9 g/dL (ref 32.0–35.9)
MONO#: 1.3 10*3/uL — ABNORMAL HIGH (ref 0.1–0.9)
NEUT%: 35.9 % — ABNORMAL LOW (ref 40.0–80.0)
RBC: 4.94 10*6/uL (ref 4.20–5.70)
WBC: 8.6 10*3/uL (ref 4.0–10.0)

## 2009-09-22 ENCOUNTER — Ambulatory Visit: Payer: Self-pay | Admitting: Hematology & Oncology

## 2009-09-22 LAB — CBC WITH DIFFERENTIAL (CANCER CENTER ONLY)
BASO#: 0.1 10*3/uL (ref 0.0–0.2)
Eosinophils Absolute: 0.4 10*3/uL (ref 0.0–0.5)
HGB: 15.1 g/dL (ref 13.0–17.1)
LYMPH%: 39.4 % (ref 14.0–48.0)
MCH: 30.7 pg (ref 28.0–33.4)
MCV: 90 fL (ref 82–98)
MONO#: 0.9 10*3/uL (ref 0.1–0.9)
MONO%: 10.9 % (ref 0.0–13.0)
RBC: 4.92 10*6/uL (ref 4.20–5.70)

## 2009-09-29 LAB — CBC WITH DIFFERENTIAL (CANCER CENTER ONLY)
EOS%: 4.6 % (ref 0.0–7.0)
Eosinophils Absolute: 0.4 10*3/uL (ref 0.0–0.5)
HCT: 45.3 % (ref 38.7–49.9)
HGB: 15.2 g/dL (ref 13.0–17.1)
LYMPH#: 3.8 10*3/uL — ABNORMAL HIGH (ref 0.9–3.3)
MCHC: 33.7 g/dL (ref 32.0–35.9)
MCV: 90 fL (ref 82–98)
MONO%: 12.4 % (ref 0.0–13.0)
NEUT#: 3.4 10*3/uL (ref 1.5–6.5)
RDW: 11.8 % (ref 10.5–14.6)
WBC: 8.8 10*3/uL (ref 4.0–10.0)

## 2009-10-06 LAB — CBC WITH DIFFERENTIAL (CANCER CENTER ONLY)
Eosinophils Absolute: 0.4 10*3/uL (ref 0.0–0.5)
MCV: 91 fL (ref 82–98)
MONO#: 1.2 10*3/uL — ABNORMAL HIGH (ref 0.1–0.9)
NEUT#: 3.9 10*3/uL (ref 1.5–6.5)
Platelets: 269 10*3/uL (ref 145–400)
RBC: 5.07 10*6/uL (ref 4.20–5.70)
WBC: 9.1 10*3/uL (ref 4.0–10.0)

## 2009-10-13 ENCOUNTER — Encounter: Admission: RE | Admit: 2009-10-13 | Discharge: 2009-10-13 | Payer: Self-pay | Admitting: Family Medicine

## 2009-10-13 LAB — CBC WITH DIFFERENTIAL (CANCER CENTER ONLY)
BASO#: 0.1 10*3/uL (ref 0.0–0.2)
Eosinophils Absolute: 0.1 10*3/uL (ref 0.0–0.5)
HCT: 46.7 % (ref 38.7–49.9)
HGB: 15.7 g/dL (ref 13.0–17.1)
LYMPH%: 19.1 % (ref 14.0–48.0)
MCV: 91 fL (ref 82–98)
MONO#: 0.5 10*3/uL (ref 0.1–0.9)
NEUT%: 75.1 % (ref 40.0–80.0)
RBC: 5.16 10*6/uL (ref 4.20–5.70)
RDW: 11.9 % (ref 10.5–14.6)
WBC: 10.3 10*3/uL — ABNORMAL HIGH (ref 4.0–10.0)

## 2009-10-20 LAB — CBC WITH DIFFERENTIAL (CANCER CENTER ONLY)
BASO#: 0.1 10*3/uL (ref 0.0–0.2)
BASO%: 0.9 % (ref 0.0–2.0)
EOS%: 6 % (ref 0.0–7.0)
HCT: 45.3 % (ref 38.7–49.9)
HGB: 15.2 g/dL (ref 13.0–17.1)
LYMPH%: 30.3 % (ref 14.0–48.0)
MCH: 30.5 pg (ref 28.0–33.4)
MCHC: 33.6 g/dL (ref 32.0–35.9)
MCV: 91 fL (ref 82–98)
NEUT%: 53 % (ref 40.0–80.0)
RDW: 12 % (ref 10.5–14.6)

## 2009-10-25 ENCOUNTER — Ambulatory Visit: Payer: Self-pay | Admitting: Hematology & Oncology

## 2009-10-27 LAB — CBC WITH DIFFERENTIAL (CANCER CENTER ONLY)
BASO#: 0.1 10*3/uL (ref 0.0–0.2)
BASO%: 0.9 % (ref 0.0–2.0)
EOS%: 4.4 % (ref 0.0–7.0)
LYMPH#: 3.7 10*3/uL — ABNORMAL HIGH (ref 0.9–3.3)
MCH: 30.3 pg (ref 28.0–33.4)
MCHC: 33.6 g/dL (ref 32.0–35.9)
MONO%: 13.8 % — ABNORMAL HIGH (ref 0.0–13.0)
NEUT#: 4.1 10*3/uL (ref 1.5–6.5)
NEUT%: 42.5 % (ref 40.0–80.0)
RDW: 11.9 % (ref 10.5–14.6)

## 2009-11-03 LAB — CBC WITH DIFFERENTIAL (CANCER CENTER ONLY)
BASO#: 0.1 10*3/uL (ref 0.0–0.2)
EOS%: 5.2 % (ref 0.0–7.0)
HGB: 15.4 g/dL (ref 13.0–17.1)
LYMPH#: 2.5 10*3/uL (ref 0.9–3.3)
MCH: 30.6 pg (ref 28.0–33.4)
MCHC: 33.8 g/dL (ref 32.0–35.9)
MONO%: 13.2 % — ABNORMAL HIGH (ref 0.0–13.0)
NEUT#: 2.5 10*3/uL (ref 1.5–6.5)
Platelets: 147 10*3/uL (ref 145–400)
RBC: 5.02 10*6/uL (ref 4.20–5.70)

## 2009-11-10 LAB — CBC WITH DIFFERENTIAL (CANCER CENTER ONLY)
BASO#: 0.2 10*3/uL (ref 0.0–0.2)
Eosinophils Absolute: 0.4 10*3/uL (ref 0.0–0.5)
HGB: 15.7 g/dL (ref 13.0–17.1)
LYMPH#: 3 10*3/uL (ref 0.9–3.3)
MONO#: 1.5 10*3/uL — ABNORMAL HIGH (ref 0.1–0.9)
MONO%: 11.7 % (ref 0.0–13.0)
NEUT#: 7.5 10*3/uL — ABNORMAL HIGH (ref 1.5–6.5)
Platelets: 29 10*3/uL — ABNORMAL LOW (ref 145–400)
RBC: 5.18 10*6/uL (ref 4.20–5.70)
WBC: 12.4 10*3/uL — ABNORMAL HIGH (ref 4.0–10.0)

## 2009-11-17 LAB — CBC WITH DIFFERENTIAL (CANCER CENTER ONLY)
BASO#: 0.1 10*3/uL (ref 0.0–0.2)
Eosinophils Absolute: 0.4 10*3/uL (ref 0.0–0.5)
HCT: 40.5 % (ref 38.7–49.9)
LYMPH%: 40.6 % (ref 14.0–48.0)
MCV: 88 fL (ref 82–98)
MONO#: 0.8 10*3/uL (ref 0.1–0.9)
NEUT%: 42 % (ref 40.0–80.0)
RBC: 4.59 10*6/uL (ref 4.20–5.70)
WBC: 7.1 10*3/uL (ref 4.0–10.0)

## 2009-11-24 ENCOUNTER — Ambulatory Visit: Payer: Self-pay | Admitting: Hematology & Oncology

## 2009-11-24 LAB — CHCC SATELLITE - SMEAR

## 2009-11-24 LAB — CBC WITH DIFFERENTIAL (CANCER CENTER ONLY)
Eosinophils Absolute: 0.3 10*3/uL (ref 0.0–0.5)
HCT: 39.6 % (ref 38.7–49.9)
LYMPH%: 39.3 % (ref 14.0–48.0)
MCV: 88 fL (ref 82–98)
MONO#: 1.1 10*3/uL — ABNORMAL HIGH (ref 0.1–0.9)
NEUT%: 41.9 % (ref 40.0–80.0)
RBC: 4.49 10*6/uL (ref 4.20–5.70)
WBC: 7.9 10*3/uL (ref 4.0–10.0)

## 2009-12-11 LAB — CBC WITH DIFFERENTIAL (CANCER CENTER ONLY)
BASO#: 0.2 10*3/uL (ref 0.0–0.2)
EOS%: 2.6 % (ref 0.0–7.0)
Eosinophils Absolute: 0.3 10*3/uL (ref 0.0–0.5)
HCT: 45.4 % (ref 38.7–49.9)
HGB: 15.2 g/dL (ref 13.0–17.1)
LYMPH#: 4.5 10*3/uL — ABNORMAL HIGH (ref 0.9–3.3)
MCH: 30.2 pg (ref 28.0–33.4)
MCHC: 33.5 g/dL (ref 32.0–35.9)
NEUT%: 47.6 % (ref 40.0–80.0)
RBC: 5.03 10*6/uL (ref 4.20–5.70)

## 2009-12-25 ENCOUNTER — Ambulatory Visit: Payer: Self-pay | Admitting: Hematology & Oncology

## 2009-12-25 LAB — CBC WITH DIFFERENTIAL (CANCER CENTER ONLY)
BASO#: 0.1 10*3/uL (ref 0.0–0.2)
EOS%: 4.7 % (ref 0.0–7.0)
Eosinophils Absolute: 0.4 10*3/uL (ref 0.0–0.5)
HGB: 14.1 g/dL (ref 13.0–17.1)
LYMPH#: 3 10*3/uL (ref 0.9–3.3)
MCHC: 33 g/dL (ref 32.0–35.9)
MONO#: 1.4 10*3/uL — ABNORMAL HIGH (ref 0.1–0.9)
NEUT#: 3.1 10*3/uL (ref 1.5–6.5)
RBC: 4.77 10*6/uL (ref 4.20–5.70)
WBC: 8 10*3/uL (ref 4.0–10.0)

## 2010-01-08 ENCOUNTER — Ambulatory Visit: Payer: Self-pay | Admitting: Diagnostic Radiology

## 2010-01-08 ENCOUNTER — Ambulatory Visit (HOSPITAL_BASED_OUTPATIENT_CLINIC_OR_DEPARTMENT_OTHER): Admission: RE | Admit: 2010-01-08 | Discharge: 2010-01-08 | Payer: Self-pay | Admitting: Hematology & Oncology

## 2010-01-08 LAB — CBC WITH DIFFERENTIAL (CANCER CENTER ONLY)
BASO#: 0.2 10*3/uL (ref 0.0–0.2)
Eosinophils Absolute: 0.5 10*3/uL (ref 0.0–0.5)
HGB: 15.1 g/dL (ref 13.0–17.1)
LYMPH#: 3.5 10*3/uL — ABNORMAL HIGH (ref 0.9–3.3)
MCH: 29.8 pg (ref 28.0–33.4)
MONO%: 12.7 % (ref 0.0–13.0)
NEUT#: 5.3 10*3/uL (ref 1.5–6.5)
RBC: 5.06 10*6/uL (ref 4.20–5.70)

## 2010-01-15 LAB — CBC WITH DIFFERENTIAL (CANCER CENTER ONLY)
BASO%: 0.8 % (ref 0.0–2.0)
EOS%: 3.9 % (ref 0.0–7.0)
LYMPH#: 3 10*3/uL (ref 0.9–3.3)
MCH: 30.4 pg (ref 28.0–33.4)
MCHC: 33.7 g/dL (ref 32.0–35.9)
MONO%: 17.3 % — ABNORMAL HIGH (ref 0.0–13.0)
NEUT#: 3 10*3/uL (ref 1.5–6.5)
Platelets: 294 10*3/uL (ref 145–400)
RDW: 13.7 % (ref 10.5–14.6)

## 2010-01-15 LAB — TECHNOLOGIST REVIEW CHCC SATELLITE: Tech Review: 1

## 2010-01-22 LAB — CBC WITH DIFFERENTIAL (CANCER CENTER ONLY)
BASO%: 1 % (ref 0.0–2.0)
LYMPH#: 2.9 10*3/uL (ref 0.9–3.3)
LYMPH%: 29.9 % (ref 14.0–48.0)
MCV: 91 fL (ref 82–98)
MONO#: 1.5 10*3/uL — ABNORMAL HIGH (ref 0.1–0.9)
NEUT#: 4.7 10*3/uL (ref 1.5–6.5)
Platelets: 385 10*3/uL (ref 145–400)
RDW: 13.9 % (ref 10.5–14.6)
WBC: 9.7 10*3/uL (ref 4.0–10.0)

## 2010-01-23 LAB — COMPREHENSIVE METABOLIC PANEL
ALT: 30 U/L (ref 0–53)
CO2: 25 mEq/L (ref 19–32)
Potassium: 4 mEq/L (ref 3.5–5.3)
Sodium: 140 mEq/L (ref 135–145)
Total Bilirubin: 0.6 mg/dL (ref 0.3–1.2)
Total Protein: 7.1 g/dL (ref 6.0–8.3)

## 2010-01-23 LAB — VITAMIN D 25 HYDROXY (VIT D DEFICIENCY, FRACTURES): Vit D, 25-Hydroxy: 43 ng/mL (ref 30–89)

## 2010-01-29 ENCOUNTER — Ambulatory Visit: Payer: Self-pay | Admitting: Hematology & Oncology

## 2010-02-02 LAB — CBC WITH DIFFERENTIAL (CANCER CENTER ONLY)
HCT: 46.4 % (ref 38.7–49.9)
MCHC: 33.5 g/dL (ref 32.0–35.9)
MCV: 90 fL (ref 82–98)
RDW: 13.4 % (ref 10.5–14.6)

## 2010-02-02 LAB — MANUAL DIFFERENTIAL (CHCC SATELLITE)
ANC (CHCC HP manual diff): 3.3 10*3/uL (ref 1.5–6.5)
Eos: 5 % (ref 0–7)
MONO: 14 % — ABNORMAL HIGH (ref 0–13)
PLT EST ~~LOC~~: DECREASED
RBC Comments: NORMAL

## 2010-02-12 LAB — CBC WITH DIFFERENTIAL (CANCER CENTER ONLY)
BASO#: 0.1 10*3/uL (ref 0.0–0.2)
BASO%: 0.8 % (ref 0.0–2.0)
Eosinophils Absolute: 0.4 10*3/uL (ref 0.0–0.5)
HCT: 45.4 % (ref 38.7–49.9)
LYMPH#: 3.1 10*3/uL (ref 0.9–3.3)
LYMPH%: 42.3 % (ref 14.0–48.0)
MCV: 91 fL (ref 82–98)
MONO#: 1.1 10*3/uL — ABNORMAL HIGH (ref 0.1–0.9)
NEUT%: 36.6 % — ABNORMAL LOW (ref 40.0–80.0)

## 2010-02-19 LAB — CBC WITH DIFFERENTIAL (CANCER CENTER ONLY)
BASO#: 0.1 10*3/uL (ref 0.0–0.2)
BASO%: 1.3 % (ref 0.0–2.0)
EOS%: 3.3 % (ref 0.0–7.0)
HCT: 46 % (ref 38.7–49.9)
HGB: 15.3 g/dL (ref 13.0–17.1)
LYMPH%: 36 % (ref 14.0–48.0)
MCH: 30 pg (ref 28.0–33.4)
MCHC: 33.3 g/dL (ref 32.0–35.9)
MCV: 90 fL (ref 82–98)
MONO%: 19.3 % — ABNORMAL HIGH (ref 0.0–13.0)
NEUT%: 40.1 % (ref 40.0–80.0)
RDW: 13.2 % (ref 10.5–14.6)

## 2010-02-26 LAB — CBC WITH DIFFERENTIAL (CANCER CENTER ONLY)
BASO%: 1.1 % (ref 0.0–2.0)
EOS%: 4.4 % (ref 0.0–7.0)
LYMPH#: 3.3 10*3/uL (ref 0.9–3.3)
MCH: 29.9 pg (ref 28.0–33.4)
MCHC: 33.1 g/dL (ref 32.0–35.9)
MONO%: 23.3 % — ABNORMAL HIGH (ref 0.0–13.0)
NEUT#: 2.6 10*3/uL (ref 1.5–6.5)
NEUT%: 31.4 % — ABNORMAL LOW (ref 40.0–80.0)
RDW: 13 % (ref 10.5–14.6)

## 2010-03-01 ENCOUNTER — Ambulatory Visit: Payer: Self-pay | Admitting: Hematology & Oncology

## 2010-03-05 LAB — CBC WITH DIFFERENTIAL (CANCER CENTER ONLY)
BASO%: 1 % (ref 0.0–2.0)
Eosinophils Absolute: 1 10*3/uL — ABNORMAL HIGH (ref 0.0–0.5)
LYMPH%: 24 % (ref 14.0–48.0)
MCH: 30.2 pg (ref 28.0–33.4)
MCV: 90 fL (ref 82–98)
MONO%: 11.5 % (ref 0.0–13.0)
NEUT#: 7.4 10*3/uL — ABNORMAL HIGH (ref 1.5–6.5)
Platelets: 384 10*3/uL (ref 145–400)
RBC: 4.84 10*6/uL (ref 4.20–5.70)
RDW: 12.4 % (ref 10.5–14.6)
WBC: 13.2 10*3/uL — ABNORMAL HIGH (ref 4.0–10.0)

## 2010-03-05 LAB — TECHNOLOGIST REVIEW CHCC SATELLITE

## 2010-03-26 LAB — CBC WITH DIFFERENTIAL (CANCER CENTER ONLY)
EOS%: 1.1 % (ref 0.0–7.0)
LYMPH%: 10.7 % — ABNORMAL LOW (ref 14.0–48.0)
MCH: 29.8 pg (ref 28.0–33.4)
MCHC: 33 g/dL (ref 32.0–35.9)
MONO%: 8.2 % (ref 0.0–13.0)
NEUT#: 15.5 10*3/uL — ABNORMAL HIGH (ref 1.5–6.5)
Platelets: 108 10*3/uL — ABNORMAL LOW (ref 145–400)
RDW: 12.2 % (ref 10.5–14.6)

## 2010-03-30 ENCOUNTER — Ambulatory Visit: Payer: Self-pay | Admitting: Hematology & Oncology

## 2010-04-02 ENCOUNTER — Ambulatory Visit (HOSPITAL_COMMUNITY)
Admission: RE | Admit: 2010-04-02 | Discharge: 2010-04-02 | Payer: Self-pay | Source: Home / Self Care | Attending: Family Medicine | Admitting: Family Medicine

## 2010-04-02 ENCOUNTER — Inpatient Hospital Stay (HOSPITAL_COMMUNITY)
Admission: EM | Admit: 2010-04-02 | Discharge: 2010-04-09 | Payer: Self-pay | Source: Home / Self Care | Admitting: Emergency Medicine

## 2010-04-02 ENCOUNTER — Encounter (INDEPENDENT_AMBULATORY_CARE_PROVIDER_SITE_OTHER): Payer: Self-pay | Admitting: Family Medicine

## 2010-04-02 LAB — CBC WITH DIFFERENTIAL (CANCER CENTER ONLY)
BASO#: 0.1 10*3/uL (ref 0.0–0.2)
Eosinophils Absolute: 0.6 10*3/uL — ABNORMAL HIGH (ref 0.0–0.5)
HCT: 42.5 % (ref 38.7–49.9)
LYMPH%: 27.2 % (ref 14.0–48.0)
MCH: 30.2 pg (ref 28.0–33.4)
MCV: 91 fL (ref 82–98)
MONO%: 14.9 % — ABNORMAL HIGH (ref 0.0–13.0)
NEUT%: 51.6 % (ref 40.0–80.0)
Platelets: 522 10*3/uL — ABNORMAL HIGH (ref 145–400)
RBC: 4.69 10*6/uL (ref 4.20–5.70)

## 2010-04-02 LAB — TECHNOLOGIST REVIEW CHCC SATELLITE

## 2010-04-06 ENCOUNTER — Encounter (INDEPENDENT_AMBULATORY_CARE_PROVIDER_SITE_OTHER): Payer: Self-pay | Admitting: Internal Medicine

## 2010-04-12 LAB — CBC WITH DIFFERENTIAL (CANCER CENTER ONLY)
HCT: 44.4 % (ref 38.7–49.9)
MCHC: 33.2 g/dL (ref 32.0–35.9)
MCV: 91 fL (ref 82–98)
RDW: 12.3 % (ref 10.5–14.6)
WBC: 13.1 10*3/uL — ABNORMAL HIGH (ref 4.0–10.0)

## 2010-04-12 LAB — MANUAL DIFFERENTIAL (CHCC SATELLITE)
ALC: 2.8 10e3/uL (ref 0.9–3.3)
ANC (CHCC HP manual diff): 6.4 10e3/uL (ref 1.5–6.5)
Band Neutrophils: 1 % (ref 0–10)
Eos: 18 % — ABNORMAL HIGH (ref 0–7)
LYMPH: 19 % (ref 14–48)
MONO: 12 % (ref 0–13)
PLT EST ~~LOC~~: ADEQUATE
SEG: 48 % (ref 40–75)
Variant Lymph: 2 % — ABNORMAL HIGH (ref 0–0)

## 2010-04-17 LAB — CBC WITH DIFFERENTIAL (CANCER CENTER ONLY)
BASO#: 0.1 10*3/uL (ref 0.0–0.2)
BASO%: 0.9 % (ref 0.0–2.0)
HCT: 44.7 % (ref 38.7–49.9)
HGB: 14.8 g/dL (ref 13.0–17.1)
LYMPH%: 29.3 % (ref 14.0–48.0)
MCHC: 33.2 g/dL (ref 32.0–35.9)
MCV: 91 fL (ref 82–98)
MONO#: 1.3 10*3/uL — ABNORMAL HIGH (ref 0.1–0.9)
NEUT%: 35.7 % — ABNORMAL LOW (ref 40.0–80.0)
RDW: 12.1 % (ref 10.5–14.6)
WBC: 7.6 10*3/uL (ref 4.0–10.0)

## 2010-04-17 LAB — PROTIME-INR (CHCC SATELLITE)

## 2010-04-17 LAB — CHCC SATELLITE - SMEAR

## 2010-04-23 LAB — PROTIME-INR (CHCC SATELLITE)
INR: 2.1 (ref 2.0–3.5)
Protime: 25.2 Seconds — ABNORMAL HIGH (ref 10.6–13.4)

## 2010-04-23 LAB — CBC WITH DIFFERENTIAL (CANCER CENTER ONLY)
BASO#: 0.1 10*3/uL (ref 0.0–0.2)
BASO%: 1 % (ref 0.0–2.0)
EOS%: 8.1 % — ABNORMAL HIGH (ref 0.0–7.0)
Eosinophils Absolute: 0.6 10*3/uL — ABNORMAL HIGH (ref 0.0–0.5)
HCT: 45 % (ref 38.7–49.9)
HGB: 15 g/dL (ref 13.0–17.1)
LYMPH#: 2.3 10*3/uL (ref 0.9–3.3)
LYMPH%: 31.6 % (ref 14.0–48.0)
MCH: 30.1 pg (ref 28.0–33.4)
MCHC: 33.3 g/dL (ref 32.0–35.9)
MCV: 91 fL (ref 82–98)
MONO#: 1.5 10*3/uL — ABNORMAL HIGH (ref 0.1–0.9)
MONO%: 20.8 % — ABNORMAL HIGH (ref 0.0–13.0)
NEUT#: 2.9 10*3/uL (ref 1.5–6.5)
NEUT%: 38.5 % — ABNORMAL LOW (ref 40.0–80.0)
Platelets: 212 10*3/uL (ref 145–400)
RBC: 4.97 10*6/uL (ref 4.20–5.70)
RDW: 12.5 % (ref 10.5–14.6)
WBC: 7.4 10*3/uL (ref 4.0–10.0)

## 2010-04-30 ENCOUNTER — Ambulatory Visit: Payer: Self-pay | Admitting: Hematology & Oncology

## 2010-04-30 LAB — CBC WITH DIFFERENTIAL (CANCER CENTER ONLY)
BASO#: 0.1 10*3/uL (ref 0.0–0.2)
BASO%: 1.3 % (ref 0.0–2.0)
EOS%: 11.2 % — ABNORMAL HIGH (ref 0.0–7.0)
Eosinophils Absolute: 1 10*3/uL — ABNORMAL HIGH (ref 0.0–0.5)
HCT: 45.7 % (ref 38.7–49.9)
HGB: 15.2 g/dL (ref 13.0–17.1)
LYMPH#: 3.1 10*3/uL (ref 0.9–3.3)
LYMPH%: 35 % (ref 14.0–48.0)
MCH: 30.1 pg (ref 28.0–33.4)
MCHC: 33.2 g/dL (ref 32.0–35.9)
MCV: 91 fL (ref 82–98)
MONO#: 1.6 10*3/uL — ABNORMAL HIGH (ref 0.1–0.9)
MONO%: 17.5 % — ABNORMAL HIGH (ref 0.0–13.0)
NEUT#: 3.1 10*3/uL (ref 1.5–6.5)
NEUT%: 35 % — ABNORMAL LOW (ref 40.0–80.0)
Platelets: 373 10*3/uL (ref 145–400)
RBC: 5.05 10*6/uL (ref 4.20–5.70)
RDW: 12.1 % (ref 10.5–14.6)
WBC: 8.9 10*3/uL (ref 4.0–10.0)

## 2010-04-30 LAB — PROTIME-INR (CHCC SATELLITE)
INR: 2.3 (ref 2.0–3.5)
Protime: 27.6 Seconds — ABNORMAL HIGH (ref 10.6–13.4)

## 2010-04-30 LAB — TECHNOLOGIST REVIEW CHCC SATELLITE

## 2010-05-06 ENCOUNTER — Encounter: Payer: Self-pay | Admitting: Gastroenterology

## 2010-05-07 LAB — CBC WITH DIFFERENTIAL (CANCER CENTER ONLY)
BASO#: 0.1 10*3/uL (ref 0.0–0.2)
BASO%: 1.2 % (ref 0.0–2.0)
EOS%: 6.3 % (ref 0.0–7.0)
HCT: 47.3 % (ref 38.7–49.9)
HGB: 16 g/dL (ref 13.0–17.1)
LYMPH%: 36.5 % (ref 14.0–48.0)
MCHC: 33.8 g/dL (ref 32.0–35.9)
MCV: 90 fL (ref 82–98)
NEUT%: 37.3 % — ABNORMAL LOW (ref 40.0–80.0)
RDW: 12 % (ref 10.5–14.6)

## 2010-05-11 NOTE — Consult Note (Signed)
NAME:  Mark [name], Mark [name] NO.:  [id number]  MEDICAL RECORD NO.:  [id number]          PATIENT TYPE:  INP  LOCATION:  5152                         FACILITY:  [facility]  PHYSICIAN:  Bernette Redbird, M.D.   DATE OF BIRTH:  06/14/1945  DATE OF CONSULTATION:  04/06/2010 DATE OF DISCHARGE:                                CONSULTATION   Dr. Myna Hidalgo asked [place] to see this 65 year old gentleman because of heme- positive emesis.  The patient was admitted to the hospital several days ago because of a DVT.  He was suffering from a viral-like illness characterized by vomiting and diarrhea, both of which have now resolved.  However, because of the need for anticoagulation in view of his DVT, especially with a history of lupus anticoagulant, endoscopic evaluation was felt to be prudent prior to committing to long-term anticoagulation therapy.  The patient does not really have any significant dyspeptic symptoms on an ongoing basis.  He is on a daily adult aspirin.  He uses Nexium on an occasional basis but not regularly.  PAST MEDICAL HISTORY:  ALLERGIES:  No known allergies.  MEDICATIONS IN THE HOSPITAL:  Vitamin D, Cipro, Flagyl, heparin infusion, Pantoprazole twice daily, and warfarin.  OPERATIONS:  Remote cholecystectomy almost 10 years ago and a splenectomy for ITP about 2 years ago.  MEDICAL ILLNESSES:  History of diabetes, obstructive sleep apnea, pancreatitis probably from a common duct stone in 2006, and a history of coughing while swallowing with a negative modified barium swallow about 4 years ago.  There is a gastric emptying scan that has shown delayed gastric emptying for which he has been advised to have a gastroparesis type diet.  HABITS:  Nonsmoker in recent years, he used to smoke.  Nondrinker.  FAMILY HISTORY:  Not obtained.  SOCIAL HISTORY:  Married, Civil engineer, contracting.  REVIEW OF SYSTEMS:  Negative for prodromal dyspeptic symptoms.  PHYSICAL  EXAMINATION:  GENERAL:  A very pleasant, jocular, well- nourished Caucasian male in no acute distress, anicteric, no pallor. CHEST:  Clear. HEART:  Unremarkable with regular rhythm, no murmurs. ABDOMEN:  Active bowel sounds and no organomegaly, guarding, mass, or tenderness.  LABORATORY DATA:  White count elevated at 22,000, up from roughly 15,000 two days ago.  Note that the patient was febrile earlier in this hospitalization but not over the past 2 days since being on antibiotics. Hemoglobin 13.5, platelets 534,000, 67 polys, 14 lymphs, 12 monocytes, and 6% eosinophils.  INR currently is 2.2.  Chemistry panel on admission, he had normal liver chemistries, albumin 3.2, BUN 17, and creatinine 1.4.  IMPRESSION: 1. Minimal hematemesis without hemodynamic instability or     posthemorrhagic anemia. 2. Recent viral-like illness with vomiting and diarrhea, resolved. 3. Hypercoagulable state with lupus anticoagulant and current deep     venous thrombosis.  Also, prior history of idiopathic     thrombocytopenic purpura , now status post splenectomy.  PLAN:  Proceed with endoscopic evaluation at the patient's earliest convenience with further management to depend on the endoscopic findings.  We will plan to do the endoscopy while on heparin.  ______________________________ Bernette Redbird, M.D.     RB/MEDQ  D:  04/06/2010  T:  04/07/2010  Job:  [id number]  cc:   Stacie Acres. Cliffton Asters, M.D. Rose Phi. Myna Hidalgo, M.D.  Electronically Signed by Bernette Redbird M.D. on 05/11/2010 04:51:39 PM

## 2010-05-11 NOTE — Op Note (Signed)
NAME:  Mark [name], Mark [name] NO.:  [id number]  MEDICAL RECORD NO.:  [id number]          PATIENT TYPE:  INP  LOCATION:  5152                         FACILITY:  [facility]  PHYSICIAN:  Bernette Redbird, M.D.   DATE OF BIRTH:  [birth year]  DATE OF PROCEDURE: DATE OF DISCHARGE:                              OPERATIVE REPORT   PROCEDURE:  Upper endoscopy with biopsies.  INDICATIONS:  A 65 year old gentleman who developed a DVT as an outpatient, in the setting of a lupus anticoagulant and hypercoagulable state.  He was brought in on heparin.  He had an episode of heme- positive emesis, perhaps in association with a viral-like illness that was associated with both upper and lower tract symptoms, specifically, vomiting and diarrhea.  Because of the anticipated need for long-term chronic anticoagulation with Coumadin, endoscopic evaluation has been requested by his hematologist.  FINDINGS:  Extensive erythema and exudate along the greater curve of stomach, raising question of ischemic gastropathy.  PROCEDURE IN DETAIL:  The procedure had been discussed with the patient, who provided written consent.  He was brought from his hospital room to the endoscopy unit.  Sedation was fentanyl 75 mcg and Versed 7.5 mg IV without arrhythmias or desaturation.  The Pentax video endoscope was passed under direct vision.  The vocal cords looked normal.  The scope was passed gently under direct vision since the patient is fully anticoagulated on heparin at this time.  The esophagus was entered without undue difficulty.  There was a prominent esophageal mucosal ring or Schatzki ring at the squamocolumnar junction, but no evidence of free reflux, reflux esophagitis, Barrett esophagus, varices, infection, or neoplasia.  A small 2-3 cm hiatal hernia was present.  The stomach was entered.  It contained no blood or coffee-ground material.  The main finding on this exam was a broad band of  erythematous, edematous mucosa with exudate, which appears similar to what one would see in ischemic colitis.  This extended all the way from the fundus along the greater curve aspect of the stomach to the prepyloric area. This area was biopsied toward the end of the procedure, very cautiously and fortunately without significant or ongoing bleeding as confirmed by watching the area and irrigating, despite the fact that the patient is on heparin.  There was a small ulcerated area of gastric mucosa in the midst of this erythema in the more proximal portion of the stomach. There was no stigma of hemorrhage on the ulcer.  No typical vascular ectasia, antral gastropathy, pyloric abnormalities, or problems in the duodenal bulb or second duodenum were seen.  A retroflexed view of the cardia was unremarkable except for a little bit of mucosal oozing, presumably from scope trauma.  The patient tolerated the procedure well and there were no apparent complications.  IMPRESSION: 1. Erythema and exudate along the greater curve of the stomach as     described above.  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 2. Small to medium-sized hiatal hernia with Schatzki ring.  PLAN: 1. Await pathology on biopsies. 2. Sucralfate, which has a property of increasing mucosal blood flow     and thus may help the tissues if they are ischemic, and which may     also help prevent mucosal hemorrhage. 3. I think the patient is appropriate for cautious anticoagulation.     He is at some risk for mucosal oozing with anticoagulation, but I     did not see any lesions that would likely lead to major GI     bleeding. 4. I have discussed the above findings and recommendations with the     patient's attending hematologist, Rose Phi. Myna Hidalgo, MD as  well as     his attending hospitalist, Dr. Sunnie Nielsen.          ______________________________ Bernette Redbird, M.D.     RB/MEDQ  D:  04/06/2010  T:  04/07/2010  Job:  [id number]  cc:   Stacie Acres. Cliffton Asters, M.D. Rose Phi. Myna Hidalgo, M.D.  Electronically Signed by Bernette Redbird M.D. on 05/11/2010 04:51:37 PM

## 2010-05-14 LAB — CBC WITH DIFFERENTIAL (CANCER CENTER ONLY)
BASO%: 1.1 % (ref 0.0–2.0)
Eosinophils Absolute: 0.3 10*3/uL (ref 0.0–0.5)
LYMPH%: 38.5 % (ref 14.0–48.0)
MCV: 89 fL (ref 82–98)
MONO#: 1.1 10*3/uL — ABNORMAL HIGH (ref 0.1–0.9)
NEUT#: 2.7 10*3/uL (ref 1.5–6.5)
Platelets: <6 10*3/uL — CL (ref 145–400)
RBC: 5.15 10*6/uL (ref 4.20–5.70)
RDW: 12.5 % (ref 10.5–14.6)
WBC: 6.8 10*3/uL (ref 4.0–10.0)

## 2010-05-14 LAB — PROTIME-INR (CHCC SATELLITE)
INR: 2 (ref 2.0–3.5)
Protime: 24 Seconds — ABNORMAL HIGH (ref 10.6–13.4)

## 2010-05-21 ENCOUNTER — Encounter (HOSPITAL_BASED_OUTPATIENT_CLINIC_OR_DEPARTMENT_OTHER): Payer: BC Managed Care – PPO | Admitting: Hematology & Oncology

## 2010-05-21 ENCOUNTER — Other Ambulatory Visit: Payer: Self-pay | Admitting: Hematology & Oncology

## 2010-05-21 DIAGNOSIS — D6859 Other primary thrombophilia: Secondary | ICD-10-CM

## 2010-05-21 DIAGNOSIS — M549 Dorsalgia, unspecified: Secondary | ICD-10-CM

## 2010-05-21 DIAGNOSIS — D693 Immune thrombocytopenic purpura: Secondary | ICD-10-CM

## 2010-05-21 DIAGNOSIS — Z86718 Personal history of other venous thrombosis and embolism: Secondary | ICD-10-CM

## 2010-05-21 LAB — CBC WITH DIFFERENTIAL (CANCER CENTER ONLY)
Eosinophils Absolute: 0.9 10*3/uL — ABNORMAL HIGH (ref 0.0–0.5)
HCT: 45.7 % (ref 38.7–49.9)
LYMPH%: 30.6 % (ref 14.0–48.0)
MCH: 29.7 pg (ref 28.0–33.4)
MCV: 89 fL (ref 82–98)
MONO#: 1.3 10*3/uL — ABNORMAL HIGH (ref 0.1–0.9)
MONO%: 10.8 % (ref 0.0–13.0)
NEUT%: 50.4 % (ref 40.0–80.0)
RBC: 5.12 10*6/uL (ref 4.20–5.70)
RDW: 12.4 % (ref 10.5–14.6)
WBC: 12.1 10*3/uL — ABNORMAL HIGH (ref 4.0–10.0)

## 2010-05-21 LAB — PROTIME-INR (CHCC SATELLITE)

## 2010-05-28 ENCOUNTER — Other Ambulatory Visit: Payer: Self-pay | Admitting: Hematology & Oncology

## 2010-05-28 ENCOUNTER — Encounter (HOSPITAL_BASED_OUTPATIENT_CLINIC_OR_DEPARTMENT_OTHER): Payer: BC Managed Care – PPO | Admitting: Hematology & Oncology

## 2010-05-28 DIAGNOSIS — Z86718 Personal history of other venous thrombosis and embolism: Secondary | ICD-10-CM

## 2010-05-28 DIAGNOSIS — M549 Dorsalgia, unspecified: Secondary | ICD-10-CM

## 2010-05-28 DIAGNOSIS — D693 Immune thrombocytopenic purpura: Secondary | ICD-10-CM

## 2010-05-28 DIAGNOSIS — D6859 Other primary thrombophilia: Secondary | ICD-10-CM

## 2010-05-28 LAB — CBC WITH DIFFERENTIAL (CANCER CENTER ONLY)
BASO#: 0.1 10*3/uL (ref 0.0–0.2)
Eosinophils Absolute: 1.1 10*3/uL — ABNORMAL HIGH (ref 0.0–0.5)
HCT: 44.8 % (ref 38.7–49.9)
HGB: 14.8 g/dL (ref 13.0–17.1)
LYMPH#: 3.2 10*3/uL (ref 0.9–3.3)
MCV: 89 fL (ref 82–98)
MONO#: 2.1 10*3/uL — ABNORMAL HIGH (ref 0.1–0.9)
NEUT%: 46.9 % (ref 40.0–80.0)
WBC: 12.1 10*3/uL — ABNORMAL HIGH (ref 4.0–10.0)

## 2010-05-28 LAB — PROTIME-INR (CHCC SATELLITE): INR: 2.3 (ref 2.0–3.5)

## 2010-06-07 ENCOUNTER — Encounter (HOSPITAL_BASED_OUTPATIENT_CLINIC_OR_DEPARTMENT_OTHER): Payer: BC Managed Care – PPO | Admitting: Hematology & Oncology

## 2010-06-07 ENCOUNTER — Other Ambulatory Visit: Payer: Self-pay | Admitting: Hematology & Oncology

## 2010-06-07 DIAGNOSIS — Z86718 Personal history of other venous thrombosis and embolism: Secondary | ICD-10-CM

## 2010-06-07 DIAGNOSIS — M549 Dorsalgia, unspecified: Secondary | ICD-10-CM

## 2010-06-07 DIAGNOSIS — D693 Immune thrombocytopenic purpura: Secondary | ICD-10-CM

## 2010-06-07 DIAGNOSIS — D6859 Other primary thrombophilia: Secondary | ICD-10-CM

## 2010-06-07 LAB — CBC WITH DIFFERENTIAL (CANCER CENTER ONLY)
BASO%: 1 % (ref 0.0–2.0)
EOS%: 11.4 % — ABNORMAL HIGH (ref 0.0–7.0)
HCT: 43.5 % (ref 38.7–49.9)
LYMPH#: 3.1 10*3/uL (ref 0.9–3.3)
MCHC: 34.3 g/dL (ref 32.0–35.9)
MONO#: 1.3 10*3/uL — ABNORMAL HIGH (ref 0.1–0.9)
NEUT#: 3.4 10*3/uL (ref 1.5–6.5)
Platelets: 92 10*3/uL — ABNORMAL LOW (ref 145–400)
RDW: 12.2 % (ref 10.5–14.6)
WBC: 8.9 10*3/uL (ref 4.0–10.0)

## 2010-06-07 LAB — PROTIME-INR (CHCC SATELLITE)
INR: 2 (ref 2.0–3.5)
Protime: 24 Seconds — ABNORMAL HIGH (ref 10.6–13.4)

## 2010-06-07 LAB — CHCC SATELLITE - SMEAR

## 2010-06-15 ENCOUNTER — Other Ambulatory Visit: Payer: Self-pay | Admitting: Hematology & Oncology

## 2010-06-15 ENCOUNTER — Encounter: Payer: Medicare Other | Admitting: Hematology & Oncology

## 2010-06-15 DIAGNOSIS — R894 Abnormal immunological findings in specimens from other organs, systems and tissues: Secondary | ICD-10-CM

## 2010-06-15 DIAGNOSIS — Z7901 Long term (current) use of anticoagulants: Secondary | ICD-10-CM

## 2010-06-15 DIAGNOSIS — I82409 Acute embolism and thrombosis of unspecified deep veins of unspecified lower extremity: Secondary | ICD-10-CM

## 2010-06-15 LAB — CBC WITH DIFFERENTIAL (CANCER CENTER ONLY)
BASO%: 0.2 % (ref 0.0–2.0)
Eosinophils Absolute: 1.1 10*3/uL — ABNORMAL HIGH (ref 0.0–0.5)
HCT: 42.7 % (ref 38.7–49.9)
LYMPH#: 3.4 10*3/uL — ABNORMAL HIGH (ref 0.9–3.3)
MONO#: 1.9 10*3/uL — ABNORMAL HIGH (ref 0.1–0.9)
NEUT%: 41.4 % (ref 40.0–80.0)
RBC: 4.93 10*6/uL (ref 4.20–5.70)
RDW: 15.2 % (ref 11.1–15.7)
WBC: 10.9 10*3/uL — ABNORMAL HIGH (ref 4.0–10.0)

## 2010-06-15 LAB — TECHNOLOGIST REVIEW CHCC SATELLITE

## 2010-06-22 ENCOUNTER — Encounter (HOSPITAL_BASED_OUTPATIENT_CLINIC_OR_DEPARTMENT_OTHER): Payer: BC Managed Care – PPO | Admitting: Hematology & Oncology

## 2010-06-22 ENCOUNTER — Other Ambulatory Visit: Payer: Self-pay | Admitting: Hematology & Oncology

## 2010-06-22 DIAGNOSIS — Z7901 Long term (current) use of anticoagulants: Secondary | ICD-10-CM

## 2010-06-22 DIAGNOSIS — R894 Abnormal immunological findings in specimens from other organs, systems and tissues: Secondary | ICD-10-CM

## 2010-06-22 DIAGNOSIS — D693 Immune thrombocytopenic purpura: Secondary | ICD-10-CM

## 2010-06-22 DIAGNOSIS — I82409 Acute embolism and thrombosis of unspecified deep veins of unspecified lower extremity: Secondary | ICD-10-CM

## 2010-06-22 LAB — CBC WITH DIFFERENTIAL (CANCER CENTER ONLY)
BASO#: 0.1 10*3/uL (ref 0.0–0.2)
Eosinophils Absolute: 0.8 10*3/uL — ABNORMAL HIGH (ref 0.0–0.5)
HCT: 43 % (ref 38.7–49.9)
HGB: 14.4 g/dL (ref 13.0–17.1)
LYMPH#: 2.9 10*3/uL (ref 0.9–3.3)
MCH: 29.4 pg (ref 28.0–33.4)
NEUT#: 5.3 10*3/uL (ref 1.5–6.5)
NEUT%: 47.5 % (ref 40.0–80.0)
RBC: 4.89 10*6/uL (ref 4.20–5.70)

## 2010-06-22 LAB — PROTIME-INR (CHCC SATELLITE): INR: 2.9 (ref 2.0–3.5)

## 2010-06-25 LAB — URINE CULTURE
Culture: NO GROWTH
Special Requests: NEGATIVE

## 2010-06-25 LAB — COMPREHENSIVE METABOLIC PANEL
ALT: 28 U/L (ref 0–53)
AST: 22 U/L (ref 0–37)
Albumin: 3 g/dL — ABNORMAL LOW (ref 3.5–5.2)
Albumin: 3.2 g/dL — ABNORMAL LOW (ref 3.5–5.2)
Alkaline Phosphatase: 57 U/L (ref 39–117)
Alkaline Phosphatase: 62 U/L (ref 39–117)
BUN: 16 mg/dL (ref 6–23)
BUN: 7 mg/dL (ref 6–23)
Calcium: 8.5 mg/dL (ref 8.4–10.5)
Chloride: 104 mEq/L (ref 96–112)
Glucose, Bld: 99 mg/dL (ref 70–99)
Potassium: 3.6 mEq/L (ref 3.5–5.1)
Potassium: 4 mEq/L (ref 3.5–5.1)
Sodium: 133 mEq/L — ABNORMAL LOW (ref 135–145)
Sodium: 138 mEq/L (ref 135–145)
Total Bilirubin: 0.6 mg/dL (ref 0.3–1.2)
Total Protein: 5.3 g/dL — ABNORMAL LOW (ref 6.0–8.3)
Total Protein: 6 g/dL (ref 6.0–8.3)

## 2010-06-25 LAB — DIFFERENTIAL
Basophils Absolute: 0 10*3/uL (ref 0.0–0.1)
Basophils Absolute: 0 10*3/uL (ref 0.0–0.1)
Basophils Absolute: 0 10*3/uL (ref 0.0–0.1)
Basophils Relative: 0 % (ref 0–1)
Basophils Relative: 0 % (ref 0–1)
Basophils Relative: 0 % (ref 0–1)
Basophils Relative: 0 % (ref 0–1)
Basophils Relative: 1 % (ref 0–1)
Eosinophils Absolute: 0.5 10*3/uL (ref 0.0–0.7)
Eosinophils Absolute: 0.5 10*3/uL (ref 0.0–0.7)
Eosinophils Absolute: 0.7 10*3/uL (ref 0.0–0.7)
Eosinophils Relative: 20 % — ABNORMAL HIGH (ref 0–5)
Eosinophils Relative: 3 % (ref 0–5)
Eosinophils Relative: 6 % — ABNORMAL HIGH (ref 0–5)
Lymphocytes Relative: 14 % (ref 12–46)
Lymphocytes Relative: 19 % (ref 12–46)
Lymphocytes Relative: 22 % (ref 12–46)
Lymphs Abs: 1.8 10*3/uL (ref 0.7–4.0)
Lymphs Abs: 2.8 10*3/uL (ref 0.7–4.0)
Lymphs Abs: 3.5 10*3/uL (ref 0.7–4.0)
Monocytes Absolute: 2.2 10*3/uL — ABNORMAL HIGH (ref 0.1–1.0)
Monocytes Absolute: 2.6 10*3/uL — ABNORMAL HIGH (ref 0.1–1.0)
Monocytes Absolute: 2.7 10*3/uL — ABNORMAL HIGH (ref 0.1–1.0)
Monocytes Absolute: 2.8 10*3/uL — ABNORMAL HIGH (ref 0.1–1.0)
Monocytes Relative: 10 % (ref 3–12)
Monocytes Relative: 11 % (ref 3–12)
Monocytes Relative: 18 % — ABNORMAL HIGH (ref 3–12)
Monocytes Relative: 18 % — ABNORMAL HIGH (ref 3–12)
Monocytes Relative: 18 % — ABNORMAL HIGH (ref 3–12)
Neutro Abs: 5.1 10*3/uL (ref 1.7–7.7)
Neutro Abs: 8.2 10*3/uL — ABNORMAL HIGH (ref 1.7–7.7)
Neutro Abs: 9.3 10*3/uL — ABNORMAL HIGH (ref 1.7–7.7)
Neutrophils Relative %: 36 % — ABNORMAL LOW (ref 43–77)
Neutrophils Relative %: 41 % — ABNORMAL LOW (ref 43–77)
Neutrophils Relative %: 58 % (ref 43–77)
Neutrophils Relative %: 80 % — ABNORMAL HIGH (ref 43–77)

## 2010-06-25 LAB — BASIC METABOLIC PANEL
BUN: 16 mg/dL (ref 6–23)
BUN: 17 mg/dL (ref 6–23)
BUN: 22 mg/dL (ref 6–23)
BUN: 9 mg/dL (ref 6–23)
CO2: 18 mEq/L — ABNORMAL LOW (ref 19–32)
CO2: 23 mEq/L (ref 19–32)
CO2: 27 mEq/L (ref 19–32)
Calcium: 8.3 mg/dL — ABNORMAL LOW (ref 8.4–10.5)
Calcium: 8.8 mg/dL (ref 8.4–10.5)
Calcium: 9.1 mg/dL (ref 8.4–10.5)
Calcium: 9.3 mg/dL (ref 8.4–10.5)
Chloride: 105 mEq/L (ref 96–112)
Chloride: 110 mEq/L (ref 96–112)
Chloride: 112 mEq/L (ref 96–112)
Chloride: 114 mEq/L — ABNORMAL HIGH (ref 96–112)
Creatinine, Ser: 1.3 mg/dL (ref 0.4–1.5)
Creatinine, Ser: 1.52 mg/dL — ABNORMAL HIGH (ref 0.4–1.5)
Creatinine, Ser: 1.84 mg/dL — ABNORMAL HIGH (ref 0.4–1.5)
GFR calc Af Amer: 56 mL/min — ABNORMAL LOW (ref >60)
GFR calc Af Amer: >60 mL/min (ref >60)
GFR calc Af Amer: >60 mL/min (ref >60)
GFR calc non Af Amer: 51 mL/min — ABNORMAL LOW (ref >60)
GFR calc non Af Amer: 56 mL/min — ABNORMAL LOW (ref >60)
Glucose, Bld: 110 mg/dL — ABNORMAL HIGH (ref 70–99)
Glucose, Bld: 112 mg/dL — ABNORMAL HIGH (ref 70–99)
Glucose, Bld: 136 mg/dL — ABNORMAL HIGH (ref 70–99)
Potassium: 3.8 mEq/L (ref 3.5–5.1)
Potassium: 4.1 mEq/L (ref 3.5–5.1)
Sodium: 137 mEq/L (ref 135–145)
Sodium: 138 mEq/L (ref 135–145)
Sodium: 139 mEq/L (ref 135–145)
Sodium: 139 mEq/L (ref 135–145)
Sodium: 142 mEq/L (ref 135–145)

## 2010-06-25 LAB — CBC
HCT: 37.3 % — ABNORMAL LOW (ref 39.0–52.0)
HCT: 39.2 % (ref 39.0–52.0)
HCT: 40.9 % (ref 39.0–52.0)
HCT: 42.9 % (ref 39.0–52.0)
Hemoglobin: 12.4 g/dL — ABNORMAL LOW (ref 13.0–17.0)
Hemoglobin: 12.9 g/dL — ABNORMAL LOW (ref 13.0–17.0)
Hemoglobin: 14.5 g/dL (ref 13.0–17.0)
Hemoglobin: 15.2 g/dL (ref 13.0–17.0)
MCH: 30.4 pg (ref 26.0–34.0)
MCH: 30.6 pg (ref 26.0–34.0)
MCHC: 33 g/dL (ref 30.0–36.0)
MCHC: 33.1 g/dL (ref 30.0–36.0)
MCHC: 33.2 g/dL (ref 30.0–36.0)
MCHC: 33.2 g/dL (ref 30.0–36.0)
MCHC: 33.8 g/dL (ref 30.0–36.0)
MCV: 89.9 fL (ref 78.0–100.0)
MCV: 90.3 fL (ref 78.0–100.0)
MCV: 90.9 fL (ref 78.0–100.0)
MCV: 91.5 fL (ref 78.0–100.0)
MCV: 91.6 fL (ref 78.0–100.0)
Platelets: 449 10*3/uL — ABNORMAL HIGH (ref 150–400)
Platelets: 463 10*3/uL — ABNORMAL HIGH (ref 150–400)
Platelets: 550 10*3/uL — ABNORMAL HIGH (ref 150–400)
Platelets: 646 10*3/uL — ABNORMAL HIGH (ref 150–400)
RBC: 4.13 MIL/uL — ABNORMAL LOW (ref 4.22–5.81)
RBC: 4.28 MIL/uL (ref 4.22–5.81)
RBC: 4.49 MIL/uL (ref 4.22–5.81)
RBC: 4.7 MIL/uL (ref 4.22–5.81)
RBC: 4.96 MIL/uL (ref 4.22–5.81)
RDW: 15 % (ref 11.5–15.5)
RDW: 15 % (ref 11.5–15.5)
RDW: 15 % (ref 11.5–15.5)
RDW: 15.1 % (ref 11.5–15.5)
RDW: 15.1 % (ref 11.5–15.5)
RDW: 15.2 % (ref 11.5–15.5)
WBC: 11.8 10*3/uL — ABNORMAL HIGH (ref 4.0–10.5)
WBC: 12.5 10*3/uL — ABNORMAL HIGH (ref 4.0–10.5)
WBC: 14.2 10*3/uL — ABNORMAL HIGH (ref 4.0–10.5)
WBC: 14.9 10*3/uL — ABNORMAL HIGH (ref 4.0–10.5)
WBC: 14.9 10*3/uL — ABNORMAL HIGH (ref 4.0–10.5)
WBC: 16 10*3/uL — ABNORMAL HIGH (ref 4.0–10.5)

## 2010-06-25 LAB — PROTIME-INR
INR: 0.99 (ref 0.00–1.49)
INR: 1.15 (ref 0.00–1.49)
INR: 1.85 — ABNORMAL HIGH (ref 0.00–1.49)
INR: 3.21 — ABNORMAL HIGH (ref 0.00–1.49)
Prothrombin Time: 14.9 seconds (ref 11.6–15.2)
Prothrombin Time: 19.9 seconds — ABNORMAL HIGH (ref 11.6–15.2)
Prothrombin Time: 25 seconds — ABNORMAL HIGH (ref 11.6–15.2)
Prothrombin Time: 32.9 seconds — ABNORMAL HIGH (ref 11.6–15.2)

## 2010-06-25 LAB — URINALYSIS, ROUTINE W REFLEX MICROSCOPIC
Bilirubin Urine: NEGATIVE
Ketones, ur: NEGATIVE mg/dL
Nitrite: NEGATIVE
Specific Gravity, Urine: 1.011 (ref 1.005–1.030)
Urobilinogen, UA: 1 mg/dL (ref 0.0–1.0)
pH: 6.5 (ref 5.0–8.0)

## 2010-06-25 LAB — STOOL CULTURE

## 2010-06-25 LAB — HEPARIN LEVEL (UNFRACTIONATED)
Heparin Unfractionated: 0.32 IU/mL (ref 0.30–0.70)
Heparin Unfractionated: 0.38 IU/mL (ref 0.30–0.70)
Heparin Unfractionated: 0.41 IU/mL (ref 0.30–0.70)
Heparin Unfractionated: 0.7 IU/mL (ref 0.30–0.70)
Heparin Unfractionated: 0.77 IU/mL — ABNORMAL HIGH (ref 0.30–0.70)
Heparin Unfractionated: 0.98 IU/mL — ABNORMAL HIGH (ref 0.30–0.70)
Heparin Unfractionated: <0.1 IU/mL — ABNORMAL LOW (ref 0.30–0.70)
Heparin Unfractionated: <0.1 IU/mL — ABNORMAL LOW (ref 0.30–0.70)

## 2010-06-25 LAB — HEMOCCULT GUIAC POC 1CARD (OFFICE): Fecal Occult Bld: POSITIVE

## 2010-06-25 LAB — CULTURE, BLOOD (ROUTINE X 2)
Culture  Setup Time: 201112210412
Culture: NO GROWTH

## 2010-06-25 LAB — MAGNESIUM
Magnesium: 2.1 mg/dL (ref 1.5–2.5)
Magnesium: 2.1 mg/dL (ref 1.5–2.5)

## 2010-06-25 LAB — APTT: aPTT: 39 seconds — ABNORMAL HIGH (ref 24–37)

## 2010-06-25 LAB — FECAL LACTOFERRIN, QUANT

## 2010-06-29 ENCOUNTER — Encounter (HOSPITAL_BASED_OUTPATIENT_CLINIC_OR_DEPARTMENT_OTHER): Payer: BC Managed Care – PPO

## 2010-06-29 ENCOUNTER — Other Ambulatory Visit: Payer: Self-pay | Admitting: Hematology & Oncology

## 2010-06-29 DIAGNOSIS — D693 Immune thrombocytopenic purpura: Secondary | ICD-10-CM

## 2010-06-29 LAB — CBC WITH DIFFERENTIAL (CANCER CENTER ONLY)
BASO#: 0.1 10*3/uL (ref 0.0–0.2)
Eosinophils Absolute: 1.1 10*3/uL — ABNORMAL HIGH (ref 0.0–0.5)
HCT: 44.8 % (ref 38.7–49.9)
LYMPH%: 39 % (ref 14.0–48.0)
MCV: 86 fL (ref 82–98)
MONO#: 1.8 10*3/uL — ABNORMAL HIGH (ref 0.1–0.9)
NEUT%: 28.5 % — ABNORMAL LOW (ref 40.0–80.0)
RBC: 5.2 10*6/uL (ref 4.20–5.70)
WBC: 9 10*3/uL (ref 4.0–10.0)

## 2010-06-30 LAB — BASIC METABOLIC PANEL
BUN: 24 mg/dL — ABNORMAL HIGH (ref 6–23)
Calcium: 8.5 mg/dL (ref 8.4–10.5)
Chloride: 107 mEq/L (ref 96–112)
Creatinine, Ser: 1.03 mg/dL (ref 0.4–1.5)
GFR calc Af Amer: >60 mL/min (ref >60)
GFR calc non Af Amer: >60 mL/min (ref >60)

## 2010-06-30 LAB — CBC
MCV: 91.9 fL (ref 78.0–100.0)
Platelets: 297 10*3/uL (ref 150–400)
RBC: 4.59 MIL/uL (ref 4.22–5.81)
WBC: 12.6 10*3/uL — ABNORMAL HIGH (ref 4.0–10.5)

## 2010-07-06 ENCOUNTER — Other Ambulatory Visit: Payer: Self-pay | Admitting: Hematology & Oncology

## 2010-07-06 ENCOUNTER — Encounter (HOSPITAL_BASED_OUTPATIENT_CLINIC_OR_DEPARTMENT_OTHER): Payer: BC Managed Care – PPO | Admitting: Hematology & Oncology

## 2010-07-06 DIAGNOSIS — R894 Abnormal immunological findings in specimens from other organs, systems and tissues: Secondary | ICD-10-CM

## 2010-07-06 DIAGNOSIS — I82409 Acute embolism and thrombosis of unspecified deep veins of unspecified lower extremity: Secondary | ICD-10-CM

## 2010-07-06 DIAGNOSIS — Z7901 Long term (current) use of anticoagulants: Secondary | ICD-10-CM

## 2010-07-06 DIAGNOSIS — D693 Immune thrombocytopenic purpura: Secondary | ICD-10-CM

## 2010-07-06 LAB — CBC WITH DIFFERENTIAL (CANCER CENTER ONLY)
Eosinophils Absolute: 0.9 10*3/uL — ABNORMAL HIGH (ref 0.0–0.5)
HCT: 43.3 % (ref 38.7–49.9)
HGB: 14.9 g/dL (ref 13.0–17.1)
LYMPH%: 33.9 % (ref 14.0–48.0)
MCV: 87 fL (ref 82–98)
MONO#: 1.5 10*3/uL — ABNORMAL HIGH (ref 0.1–0.9)
Platelets: 50 10*3/uL — ABNORMAL LOW (ref 145–400)
RBC: 4.98 10*6/uL (ref 4.20–5.70)
WBC: 7.4 10*3/uL (ref 4.0–10.0)

## 2010-07-06 LAB — PROTIME-INR (CHCC SATELLITE): Protime: 46.8 Seconds — ABNORMAL HIGH (ref 10.6–13.4)

## 2010-07-08 ENCOUNTER — Emergency Department (HOSPITAL_COMMUNITY)
Admission: EM | Admit: 2010-07-08 | Discharge: 2010-07-09 | Disposition: A | Discharge status: Home / Self Care | Payer: Medicare Other | Attending: Emergency Medicine | Admitting: Emergency Medicine

## 2010-07-08 DIAGNOSIS — Z86718 Personal history of other venous thrombosis and embolism: Secondary | ICD-10-CM | POA: Insufficient documentation

## 2010-07-08 DIAGNOSIS — I10 Essential (primary) hypertension: Secondary | ICD-10-CM | POA: Insufficient documentation

## 2010-07-08 DIAGNOSIS — Z7901 Long term (current) use of anticoagulants: Secondary | ICD-10-CM | POA: Insufficient documentation

## 2010-07-08 DIAGNOSIS — D6949 Other primary thrombocytopenia: Secondary | ICD-10-CM | POA: Insufficient documentation

## 2010-07-08 DIAGNOSIS — I252 Old myocardial infarction: Secondary | ICD-10-CM | POA: Insufficient documentation

## 2010-07-08 DIAGNOSIS — S61209A Unspecified open wound of unspecified finger without damage to nail, initial encounter: Secondary | ICD-10-CM | POA: Insufficient documentation

## 2010-07-08 DIAGNOSIS — W540XXA Bitten by dog, initial encounter: Secondary | ICD-10-CM | POA: Insufficient documentation

## 2010-07-08 DIAGNOSIS — E119 Type 2 diabetes mellitus without complications: Secondary | ICD-10-CM | POA: Insufficient documentation

## 2010-07-09 ENCOUNTER — Emergency Department (HOSPITAL_COMMUNITY): Payer: Medicare Other

## 2010-07-13 ENCOUNTER — Encounter (HOSPITAL_BASED_OUTPATIENT_CLINIC_OR_DEPARTMENT_OTHER): Payer: BC Managed Care – PPO | Admitting: Hematology & Oncology

## 2010-07-13 ENCOUNTER — Other Ambulatory Visit: Payer: Self-pay | Admitting: Hematology & Oncology

## 2010-07-13 DIAGNOSIS — D693 Immune thrombocytopenic purpura: Secondary | ICD-10-CM

## 2010-07-13 DIAGNOSIS — R894 Abnormal immunological findings in specimens from other organs, systems and tissues: Secondary | ICD-10-CM

## 2010-07-13 DIAGNOSIS — Z7901 Long term (current) use of anticoagulants: Secondary | ICD-10-CM

## 2010-07-13 DIAGNOSIS — I82409 Acute embolism and thrombosis of unspecified deep veins of unspecified lower extremity: Secondary | ICD-10-CM

## 2010-07-13 LAB — CBC WITH DIFFERENTIAL (CANCER CENTER ONLY)
BASO%: 0.5 % (ref 0.0–2.0)
Eosinophils Absolute: 0.8 10*3/uL — ABNORMAL HIGH (ref 0.0–0.5)
HCT: 44.8 % (ref 38.7–49.9)
LYMPH%: 31.2 % (ref 14.0–48.0)
MCV: 87 fL (ref 82–98)
MONO#: 1.9 10*3/uL — ABNORMAL HIGH (ref 0.1–0.9)
NEUT%: 40.6 % (ref 40.0–80.0)
RDW: 15.8 % — ABNORMAL HIGH (ref 11.1–15.7)
WBC: 10 10*3/uL (ref 4.0–10.0)

## 2010-07-16 LAB — COMPREHENSIVE METABOLIC PANEL
BUN: 13 mg/dL (ref 6–23)
CO2: 22 mEq/L (ref 19–32)
Chloride: 107 mEq/L (ref 96–112)
Creatinine, Ser: 1.22 mg/dL (ref 0.4–1.5)
GFR calc non Af Amer: 60 mL/min — ABNORMAL LOW (ref >60)
Glucose, Bld: 119 mg/dL — ABNORMAL HIGH (ref 70–99)
Total Bilirubin: 0.7 mg/dL (ref 0.3–1.2)

## 2010-07-16 LAB — CBC
HCT: 40 % (ref 39.0–52.0)
HCT: 40.7 % (ref 39.0–52.0)
HCT: 45.7 % (ref 39.0–52.0)
Hemoglobin: 13.6 g/dL (ref 13.0–17.0)
Hemoglobin: 15.5 g/dL (ref 13.0–17.0)
MCHC: 33.6 g/dL (ref 30.0–36.0)
MCV: 90.4 fL (ref 78.0–100.0)
MCV: 90.7 fL (ref 78.0–100.0)
MCV: 91.6 fL (ref 78.0–100.0)
Platelets: 35 10*3/uL — CL (ref 150–400)
RBC: 4.42 MIL/uL (ref 4.22–5.81)
RBC: 4.44 MIL/uL (ref 4.22–5.81)
RBC: 5.04 MIL/uL (ref 4.22–5.81)
WBC: 26.1 10*3/uL — ABNORMAL HIGH (ref 4.0–10.5)
WBC: 9.4 10*3/uL (ref 4.0–10.5)

## 2010-07-16 LAB — DIFFERENTIAL
Basophils Absolute: 0 10*3/uL (ref 0.0–0.1)
Eosinophils Absolute: 0.4 10*3/uL (ref 0.0–0.7)
Lymphocytes Relative: 34 % (ref 12–46)
Monocytes Relative: 21 % — ABNORMAL HIGH (ref 3–12)

## 2010-07-16 LAB — CROSSMATCH

## 2010-07-16 LAB — PROTIME-INR
INR: 0.99 (ref 0.00–1.49)
Prothrombin Time: 13 seconds (ref 11.6–15.2)
Prothrombin Time: 14.8 seconds (ref 11.6–15.2)

## 2010-07-16 LAB — GLUCOSE, CAPILLARY

## 2010-07-19 ENCOUNTER — Encounter: Payer: Medicare Other | Admitting: Hematology & Oncology

## 2010-07-19 ENCOUNTER — Other Ambulatory Visit: Payer: Self-pay | Admitting: Hematology & Oncology

## 2010-07-19 LAB — CBC WITH DIFFERENTIAL (CANCER CENTER ONLY)
BASO#: 0 10*3/uL (ref 0.0–0.2)
BASO%: 0.5 % (ref 0.0–2.0)
Eosinophils Absolute: 0.5 10*3/uL (ref 0.0–0.5)
HCT: 43 % (ref 38.7–49.9)
HGB: 14.5 g/dL (ref 13.0–17.1)
LYMPH#: 3 10*3/uL (ref 0.9–3.3)
LYMPH%: 35.7 % (ref 14.0–48.0)
MCV: 87 fL (ref 82–98)
MONO#: 1.5 10*3/uL — ABNORMAL HIGH (ref 0.1–0.9)
NEUT%: 39.9 % — ABNORMAL LOW (ref 40.0–80.0)
RBC: 4.94 10*6/uL (ref 4.20–5.70)
WBC: 8.3 10*3/uL (ref 4.0–10.0)

## 2010-07-19 LAB — PROTIME-INR (CHCC SATELLITE)

## 2010-07-27 ENCOUNTER — Other Ambulatory Visit: Payer: Self-pay | Admitting: Hematology & Oncology

## 2010-07-27 ENCOUNTER — Encounter (HOSPITAL_BASED_OUTPATIENT_CLINIC_OR_DEPARTMENT_OTHER): Payer: Medicare Other | Admitting: Hematology & Oncology

## 2010-07-27 DIAGNOSIS — I82409 Acute embolism and thrombosis of unspecified deep veins of unspecified lower extremity: Secondary | ICD-10-CM

## 2010-07-27 DIAGNOSIS — Z7901 Long term (current) use of anticoagulants: Secondary | ICD-10-CM

## 2010-07-27 DIAGNOSIS — R894 Abnormal immunological findings in specimens from other organs, systems and tissues: Secondary | ICD-10-CM

## 2010-07-27 DIAGNOSIS — D693 Immune thrombocytopenic purpura: Secondary | ICD-10-CM

## 2010-07-27 LAB — CBC WITH DIFFERENTIAL (CANCER CENTER ONLY)
BASO%: 0.5 % (ref 0.0–2.0)
Eosinophils Absolute: 0.4 10*3/uL (ref 0.0–0.5)
HCT: 43.3 % (ref 38.7–49.9)
LYMPH#: 2.7 10*3/uL (ref 0.9–3.3)
LYMPH%: 32.5 % (ref 14.0–48.0)
MCV: 86 fL (ref 82–98)
MONO#: 1.6 10*3/uL — ABNORMAL HIGH (ref 0.1–0.9)
NEUT%: 43.1 % (ref 40.0–80.0)
RBC: 5.01 10*6/uL (ref 4.20–5.70)
RDW: 14.9 % (ref 11.1–15.7)
WBC: 8.3 10*3/uL (ref 4.0–10.0)

## 2010-07-27 LAB — PROTIME-INR (CHCC SATELLITE)

## 2010-08-03 ENCOUNTER — Other Ambulatory Visit: Payer: Self-pay | Admitting: Hematology & Oncology

## 2010-08-03 ENCOUNTER — Encounter (HOSPITAL_BASED_OUTPATIENT_CLINIC_OR_DEPARTMENT_OTHER): Payer: Medicare Other | Admitting: Hematology & Oncology

## 2010-08-03 DIAGNOSIS — Z7901 Long term (current) use of anticoagulants: Secondary | ICD-10-CM

## 2010-08-03 DIAGNOSIS — R894 Abnormal immunological findings in specimens from other organs, systems and tissues: Secondary | ICD-10-CM

## 2010-08-03 DIAGNOSIS — D693 Immune thrombocytopenic purpura: Secondary | ICD-10-CM

## 2010-08-03 DIAGNOSIS — I82409 Acute embolism and thrombosis of unspecified deep veins of unspecified lower extremity: Secondary | ICD-10-CM

## 2010-08-03 LAB — CBC WITH DIFFERENTIAL (CANCER CENTER ONLY)
BASO%: 0.5 % (ref 0.0–2.0)
Eosinophils Absolute: 0.5 10*3/uL (ref 0.0–0.5)
HCT: 43.6 % (ref 38.7–49.9)
LYMPH#: 2.8 10*3/uL (ref 0.9–3.3)
MCV: 86 fL (ref 82–98)
MONO#: 1.3 10*3/uL — ABNORMAL HIGH (ref 0.1–0.9)
Platelets: 49 10*3/uL — ABNORMAL LOW (ref 145–400)
RBC: 5.1 10*6/uL (ref 4.20–5.70)
RDW: 14.8 % (ref 11.1–15.7)
WBC: 8 10*3/uL (ref 4.0–10.0)

## 2010-08-03 LAB — PROTIME-INR (CHCC SATELLITE): Protime: 28.8 Seconds — ABNORMAL HIGH (ref 10.6–13.4)

## 2010-08-09 ENCOUNTER — Other Ambulatory Visit: Payer: Self-pay | Admitting: Hematology & Oncology

## 2010-08-09 ENCOUNTER — Encounter (HOSPITAL_BASED_OUTPATIENT_CLINIC_OR_DEPARTMENT_OTHER): Payer: Medicare Other | Admitting: Hematology & Oncology

## 2010-08-09 DIAGNOSIS — R894 Abnormal immunological findings in specimens from other organs, systems and tissues: Secondary | ICD-10-CM

## 2010-08-09 DIAGNOSIS — D6859 Other primary thrombophilia: Secondary | ICD-10-CM

## 2010-08-09 DIAGNOSIS — I82409 Acute embolism and thrombosis of unspecified deep veins of unspecified lower extremity: Secondary | ICD-10-CM

## 2010-08-09 DIAGNOSIS — D693 Immune thrombocytopenic purpura: Secondary | ICD-10-CM

## 2010-08-09 LAB — PROTIME-INR (CHCC SATELLITE)
INR: 2.4 (ref 2.0–3.5)
Protime: 28.8 Seconds — ABNORMAL HIGH (ref 10.6–13.4)

## 2010-08-09 LAB — CBC WITH DIFFERENTIAL (CANCER CENTER ONLY)
BASO%: 1 % (ref 0.0–2.0)
Eosinophils Absolute: 0.6 10*3/uL — ABNORMAL HIGH (ref 0.0–0.5)
LYMPH#: 2.8 10*3/uL (ref 0.9–3.3)
LYMPH%: 35.2 % (ref 14.0–48.0)
MONO#: 1.8 10*3/uL — ABNORMAL HIGH (ref 0.1–0.9)
NEUT#: 2.7 10*3/uL (ref 1.5–6.5)
Platelets: 33 10*3/uL — ABNORMAL LOW (ref 145–400)
RBC: 5.2 10*6/uL (ref 4.20–5.70)
RDW: 14.9 % (ref 11.1–15.7)
WBC: 8 10*3/uL (ref 4.0–10.0)

## 2010-08-20 ENCOUNTER — Other Ambulatory Visit: Payer: Self-pay | Admitting: Hematology & Oncology

## 2010-08-20 ENCOUNTER — Encounter (HOSPITAL_BASED_OUTPATIENT_CLINIC_OR_DEPARTMENT_OTHER): Payer: Medicare Other | Admitting: Hematology & Oncology

## 2010-08-20 DIAGNOSIS — Z7901 Long term (current) use of anticoagulants: Secondary | ICD-10-CM

## 2010-08-20 DIAGNOSIS — D693 Immune thrombocytopenic purpura: Secondary | ICD-10-CM

## 2010-08-20 DIAGNOSIS — Z86718 Personal history of other venous thrombosis and embolism: Secondary | ICD-10-CM

## 2010-08-20 DIAGNOSIS — I82409 Acute embolism and thrombosis of unspecified deep veins of unspecified lower extremity: Secondary | ICD-10-CM

## 2010-08-20 DIAGNOSIS — D6859 Other primary thrombophilia: Secondary | ICD-10-CM

## 2010-08-20 DIAGNOSIS — R894 Abnormal immunological findings in specimens from other organs, systems and tissues: Secondary | ICD-10-CM

## 2010-08-20 LAB — PROTIME-INR (CHCC SATELLITE): INR: 2 (ref 2.0–3.5)

## 2010-08-20 LAB — CBC WITH DIFFERENTIAL (CANCER CENTER ONLY)
BASO%: 0.5 % (ref 0.0–2.0)
Eosinophils Absolute: 0.4 10*3/uL (ref 0.0–0.5)
HCT: 43.5 % (ref 38.7–49.9)
HGB: 14.7 g/dL (ref 13.0–17.1)
LYMPH#: 2.7 10*3/uL (ref 0.9–3.3)
MONO#: 1.7 10*3/uL — ABNORMAL HIGH (ref 0.1–0.9)
NEUT%: 44.5 % (ref 40.0–80.0)
RBC: 5.01 10*6/uL (ref 4.20–5.70)
RDW: 15.4 % (ref 11.1–15.7)
WBC: 8.6 10*3/uL (ref 4.0–10.0)

## 2010-08-20 LAB — RETICULOCYTES (CHCC): ABS Retic: 75.3 10*3/uL (ref 19.0–186.0)

## 2010-08-27 ENCOUNTER — Encounter (HOSPITAL_BASED_OUTPATIENT_CLINIC_OR_DEPARTMENT_OTHER): Payer: Medicare Other | Admitting: Hematology & Oncology

## 2010-08-27 ENCOUNTER — Other Ambulatory Visit: Payer: Self-pay | Admitting: Hematology & Oncology

## 2010-08-27 DIAGNOSIS — D693 Immune thrombocytopenic purpura: Secondary | ICD-10-CM

## 2010-08-27 LAB — CBC WITH DIFFERENTIAL (CANCER CENTER ONLY)
BASO#: 0.1 10*3/uL (ref 0.0–0.2)
Eosinophils Absolute: 0.4 10*3/uL (ref 0.0–0.5)
HGB: 14.9 g/dL (ref 13.0–17.1)
LYMPH#: 2.6 10*3/uL (ref 0.9–3.3)
MCH: 29.7 pg (ref 28.0–33.4)
MONO%: 24.3 % — ABNORMAL HIGH (ref 0.0–13.0)
NEUT#: 2.8 10*3/uL (ref 1.5–6.5)
RBC: 5.02 10*6/uL (ref 4.20–5.70)

## 2010-08-28 NOTE — Op Note (Signed)
NAME:  Mark [name], OLD NO.:  [id number]   MEDICAL RECORD NO.:  [id number]          PATIENT TYPE:  INP   LOCATION:  0004                         [facility]ACILITY[facility]  [facility]   PHYSICIAN:  Ardeth Sportsman, MD     DATE OF BIRTH:  [birth year]   DATE OF PROCEDURE:  09/16/2007  DATE OF DISCHARGE:                               OPERATIVE REPORT   PRIMARY CARE PHYSICIAN:  Stacie Acres. Cliffton Asters, MD   HEMATOLOGIST:  Rose Phi. Myna Hidalgo, MD   CARDIOLOGIST:  Dr. Shelbie Ammons of [facility].   NEUROLOGIST:  Dr. Monia Sabal at [facility].   PREOPERATIVE DIAGNOSES:  1. Idiopathic thrombocytopenic purpura.  2. Recent acute autoimmune hemolytic anemia (Evans syndrome).  3. Accessory spleen.   POSTOPERATIVE DIAGNOSES:  1. Idiopathic thrombocytopenic purpura.  2. Recent acute autoimmune hemolytic anemia (Evans syndrome).  3. Accessory spleen.   SURGEON:  Ardeth Sportsman, MD   ASSISTANT:  Adolph Pollack, MD   PROCEDURES PERFORMED:  1. Laparoscopic splenectomy.  2. Laparoscopic removal of accessory spleen and hilum of spleen.   ANESTHESIA:  1. General endotracheal.  2. Local anesthetic in a field block around all port sites.   SPECIMEN:  Morcellated spleen with accessory spleen.   ESTIMATED BLOOD LOSS:  1200 mL.   COMPLICATIONS:  No major complications.   INDICATIONS:  Mark [name] is a pleasant 65 year old gentleman with a  complicated medical history, who had struggled with from  thrombocytopenia and with diagnosis of chronic ITP.  His hemoglobin had  been dropping and he has been getting sufficient medical therapy.  Given  the fact he had two hematologic disorders, a recommendation was made for  splenectomy.  He was initially seeing Dr. Derrell Lolling but due to scheduling  issues the patient wished there was a need to have it done sooner and I  was available, and therefore Dr. Derrell Lolling referred the patient to me.   The anatomy and physiology of splenic function  was discussed.  Pathophysiology of hemolytic disorders occluding ITP and hemolytic  anemia was discussed and the indications for splenectomy were explained.  The risks, benefits and alternative were discussed in detail.  The need  for possible transfusion of blood products was discussed as well.  The  possibility of recurrence with another accessory spleen noted was a  discussed as well.  Questions were answered and he agreed to proceed.   OPERATIVE FINDINGS:  He had an enlarged spleen with dense adhesions  circumferentially, especially posteriorly.  He had a segmental blood  supply with very fragile vessels.  The short gastrics were very  posterior and superior.   DESCRIPTION OF PROCEDURE:  Informed consent was confirmed.  The patient  received IV cefazolin just prior to surgery.  He had sequential  compression devices active during the entire case.  He underwent general  anesthesia without any difficulty.  He had a Foley catheter sterilely  placed.  He was positioned on a beanbag at a 45-degree lateral  positioning with an axillary roll under his right axilla and a gentle  airplaning of his left upper extremity  as she was positioned left-side-  down.   Entry was gained in the abdomen with a 5-mm port using optical entry  technique with the patient in steep reverse Trendelenburg and left side  up, and this was placed in the left upper quadrant.  Camera inspection  revealed no intra-abdominal injury.  Under direct visualization a 5-mm  port was placed in the midclavicular line on the left side and another  one was placed in the posterior axillary line of the left flank.  A 12  port was placed in the anterior axillary line.   Camera inspection revealed a large omentum but was gradually this was  able to be reflected away.  He had some wispy attachments of omentum  anteriorly and these were freed off using ultrasonic dissection.  I went  ahead and removed the splenocolic attachments by  mobilizing part of the  splenic flexure of colon away from the inferior part of the pole of the  spleen.  In doing this we elevated the spleen anteriorly and medially  and could get some of the posterolateral attachments, at least for the  inferior half.  There were numerous side adhesions, although there were  rather wispy.  Care was made to particularly take this off.   Attention was turned more toward the medial side of the spleen.  This  was taken in layers since he had a lot of omental and retroperitoneal  fat until we came to a larger structure consistent with the main splenic  vein.  This was skeletonized.  In operating here there was some oozing  of blood.  This was controlled and a staplers was placed across here  with good hemostasis.  There was a small oozer and a couple of clips  were placed across there.   He did not really have much in the way of short gastrics except for  very, very superiorly.  We decided to delay on dissecting those until we  could get to the hilum and therefore careful dissection was done to help  lift the spleen anteriorly and laterally and get underneath the hilum  and take it in a series of stapler firings.  I got through most of them  and got rid of most of the supply.  There were some small posterior  splenic and retroperitoneal branches that did have some oozing, and  these were isolated and controlled with focused cautery and/or  ultrasonic dissection and/or clips.   Further dissection was done until we got toward the superior pole of the  spleen, and he had some very intimate short gastric vessels for the  superior the tip of the spleen from the proximal quarter.  I eventually  peeled the stomach off of that, performed ultrasonic dissection of the  short gastrics and hemostasis was pretty good with this.   Further dissection was done and he still had some significant  retroperitoneal attachments although they did not seem hilar.  Eventually  we were able to roll the spleen over and free those off and  get circumferential mobilization, and the spleen was completely  mobilized.  Please note that the accessory spleen could be seen in the  hilum, that this was seen preoperatively by imaging.  It was,  fortunately, close to the spleen and it was sent with the specimen  itself.   Copious irrigation was done and hemostasis was ensured.  The spleen was  placed in a large morcellating bag and brought out the 12-mm port.  This  was extended another 5 mm and morcellation was done.   Once morcellation was complete, reinspection was made and there were  some old clots that were freed off.  There was a small oozer on the  retroperitoneum.  This was easily controlled with ultrasonic dissection  but otherwise, hemostasis was excellent.  The stomach was viable as well  as the splenic flexure of the colon.  Omentum was allowed to fall back  up into the cavity.  Capnoperitoneum was evacuated and the ports were  removed.  The fascial defect of the larger incision was closed using #1  running PDS to good result.  Skin was closed using 4-0 Vicryl stitch.  Dermabond was applied.  The patient was extubated and sent to the  recovery room in stable condition.   I am about to explain the operative findings to the patient's family.  I  discussed postop care with the patient himself just prior to surgery.      Ardeth Sportsman, MD  Electronically Signed     SCG/MEDQ  D:  09/16/2007  T:  09/16/2007  Job:  [id number]   cc:   Stacie Acres. Cliffton Asters, M.D.  Fax: [phone]   Rose Phi. Myna Hidalgo, M.D.  Fax: [phone]   Shelbie Ammons, MD  [facility]   Monia Sabal, MD  [facility]

## 2010-08-28 NOTE — H&P (Signed)
NAME:  Mark [name], Mark [name] NO.:  [id number]   MEDICAL RECORD NO.:  [id number]          PATIENT TYPE:  EMS   LOCATION:  ED                           FACILITY:  [facility]   PHYSICIAN:  Corinna L. Lendell Caprice, MDDATE OF BIRTH:  [birth year]   DATE OF ADMISSION:  03/18/2007  DATE OF DISCHARGE:                              HISTORY & PHYSICAL   CHIEF COMPLAINT:  Vomiting and diarrhea.   HISTORY OF PRESENT ILLNESS:  Mark [name] is a 65 year old white male  with multiple medical problems with sudden onset of intractable vomiting  in the middle of the night.  He also had severe profuse, explosive  watery diarrhea.  He denies any hematochezia or hemoptysis.  He denies  any melena.  He denies fevers, chills.  He has been sneezing a lot  recently.  He has had no sick contacts.  He cannot recall whether there  may have been something unusual that he ate.  He has had no recent travel.  He was so weak he could barely stand up.   PAST MEDICAL HISTORY:  1. Idiopathic thrombocytopenic purpura.  2. Seizure disorder.  3. History of recurrent DVT, not on Coumadin due to above.  4. 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  5. History of hepatitis B.  6. Depression.  7. ADD.  8. Hypertension.  9. Diabetes.  10.Obstructive sleep apnea.   MEDICATIONS:  1. Strattera 120 mg a day.  2. Effexor XR 75 mg nightly.  3. AndroGel topical daily 40.  4. Fortical nasal spray daily.  5. Lisinopril/hydrochlorothiazide 20/25 mg a day.  6. Metformin 750 mg p.o. t.i.d.  7. Keppra 1500 mg p.o. b.i.d.  8. Nexium 40 mg a day.  9. He has been taking ibuprofen recently for back pain.  10.Calcium daily.  11.Vitamin B complex daily.  12.Multivitamin a day.  13.Phenobarbital 30 mg a day.  14.Prednisone 10 mg a day.   SOCIAL HISTORY:  The patient is a Civil engineer, contracting.  He quit smoking  years ago.  He denies heavy drinking or drug abuse.  He is married.   FAMILY HISTORY:  Reviewed and as per previous dictations.   REVIEW OF SYSTEMS:  As above, otherwise negative.   PHYSICAL EXAMINATION:  VITAL SIGNS:  Temperature is 97.4, blood pressure  96/74, pulse 119, respiratory rate 24, oxygen saturation 97% on room  air.  GENERAL:  The patient is well-nourished, well-developed in no acute  distress.  HEENT:  Normocephalic, atraumatic.  Pupils equal, round and reactive to  light.  Sclerae nonicteric.  Moist mucous membranes.  NECK:  Supple.  No lymphadenopathy.  No JVD.  No thyromegaly.  LUNGS:  Clear to auscultation bilaterally without wheezes, rhonchi or  rales.  CARDIOVASCULAR:  Regular rate and rhythm without murmurs,  gallops or rubs. ABDOMEN:  Soft, nontender, nondistended.  Normal bowel  sounds.  GU/RECTAL:  Deferred.  EXTREMITIES:  No clubbing, cyanosis or edema.  SKIN:  No rash.  PSYCHIATRIC:  Normal affect.  NEUROLOGIC:  Alert and oriented.  Cranial nerves and sensory motor exam  are intact.   LABORATORY DATA:  White  blood cell count is 18,000, hemoglobin 19,  hematocrit 54, platelet count 37, neutrophils 88% lymphocytes 4%.  His  platelet count was reportedly normal last week. Basic metabolic panel  significant for a glucose of 193, BUN 41, creatinine 2.91.  Last year  his creatinine was normal.  Troponin 0.02.   DIAGNOSTICS:  Acute abdominal series shows questionable right-sided rib  lesion, recommend right rib films, nonspecific nonobstructive bowel gas  pattern, few air-fluid levels can be seen and gastroenteritis.   ASSESSMENT/PLAN:  1. Vomiting and diarrhea, most likely viral gastroenteritis in the      setting of thrombocytopenia and acute renal failure.  He will be      admitted to telemetry for supportive care.  I will check stool      cultures, leukocytes, C. diff and give stress dose of      hydrocortisone, but continue prednisone.  He will get IV fluids,      antiemetics and supportive care.  2. Acute renal failure secondary to above.  Will monitor  3. Thrombocytopenia with  history of idiopathic thrombocytopenic      purpura, this was reportedly in remission.  I will monitor      platelets and consider a hematology consult if this continues to be      a problem.  He has no bleeding currently.  4. History of recurrent deep venous thrombosis, no Lovenox due to the      thrombocytopenia.  He will get sequential compression devices.  5. Diabetes.  Hold his metformin secondary to above, but give sliding      scale.  6. Seizure disorder.  Continue outpatient medications.  7. History of myocardial infarction.  8. History of hepatitis B.  9. Attention deficit disorder  10.Abnormal chest x-ray, will check rib films.  11.Obstructive sleep apnea.  Continue CPAP.      Corinna L. Lendell Caprice, MD  Electronically Signed     CLS/MEDQ  D:  03/18/2007  T:  03/18/2007  Job:  [id number]   cc:   Stacie Acres. Cliffton Asters, M.D.  Fax: [phone]   Rose Phi. Myna Hidalgo, M.D.  Fax: [phone]

## 2010-08-28 NOTE — Discharge Summary (Signed)
NAME:  Mark [name], Mark [name] NO.:  [id number]   MEDICAL RECORD NO.:  [id number]          PATIENT TYPE:  INP   LOCATION:  1524                         FACILITY:  [facility]   PHYSICIAN:  Mark Sportsman, MD     DATE OF BIRTH:  [birth year]   DATE OF ADMISSION:  09/16/2007  DATE OF DISCHARGE:  09/21/2007                               DISCHARGE SUMMARY   PRIMARY CARE PHYSICIAN:  Mark [name].   HEMATOLOGIST:  Dr. Arlan [name].   SURGEON:  Mark [name].   FINAL DISCHARGE DIAGNOSES:  1. Idiopathic thrombocytic purpura.  2. 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   OTHER DIAGNOSES:  1. Obstructive sleep apnea.  2. Seizure disorder.  3. Hypertension.  4. Steroid induced diabetes.  5. History of myocardial infarction in 1992 with coronary disease.  6. History of deep venous thrombosis in 1991 and 2001, status post      Coumadin.  7. Hepatitis B positive.  8. Depression.  9. ADHD.  10.Status post cataract surgery.  11.Status post cholecystectomy.  12.Status post vertebroplasty.   PROCEDURES:  Laparoscopic splenectomy and removal accessory spleen.   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   HOSPITAL COURSE:  Mark [name] is a 65 year old gentleman who has been  followed by Dr. Myna [name] with evidence of thrombocytopenia and diagnosis  of ITP.  He started developing anemia as well and was found evidence of  autoimmune hemolytic component consistent with Evans syndrome.  He has  had problems with steroids with diabetes and other issues and felt that  medical therapy options were dwindling for him.  Therefore, surgical  consultation was requested.  He initially was seen by Dr. Derrell [name], but  during the scheduling issues was referred to me for surgery.   The patient underwent a laparoscopic splenectomy on September 16, 2007.  He  had above average blood loss, but hemostasis was good.  Postoperatively,  his  hemoglobin did drift down into the 8's.  He did have an episode of  chest pain.  His CK-MB and troponins were negative.  His chest x-ray  only showed possible mild left lower quadrant effusion.  His EKG was  negative.  However, given the discomfort, Dr. Myna [name] felt it was safe  to transfuse him, and therefore, he was transferred 2 units of packed  red cells.  Hemoglobin came around 10 for which it stayed for the last 3  days.   IMPRESSION:  The patient did have somewhat of a nagging cough and a  little bit of rhonchi.  He has got some pulmonary toilet, and after  walking around that cleared up well.  He did not have evidence of  pneumonia.  He had issues of urinary retention and had to have his Foley  catheter replaced, but by the time of discharge was urinating without  difficulty.   He had some mild nausea issues, but by the time of discharge, was  tolerating solid diet with flatus and bowel movements.  He was able to  transition over to oral pain medications.  He  did have a little bit of  issues of mild confusion, but was completely lucid at the time of  discharge.   Based on these improvements, thought it would be reasonable for him to  discharge with following instructions.  1. He is to return to clinic to see me about 2 weeks.  2. He should call if he has any fevers, chills, sweats, nausea,      vomiting, worsening pain, lightheadedness, dizziness, drainage from      incisions or worsening tenderness or other concerns.  3. He should follow up Dr. Myna [name] probably in the next few weeks.  He      will call Dr. Gustavo [name] office to help establish a follow-up      appointment with him or one of his colleagues.  4. He can return to work either later this week or next week.  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  I have      instructed him to start at half days and make sure that he is off      narcotics before he does this.  The patient felt comfortable       regulating that under our guidance.  He should stick to a low-fat      diet.  I think he should walk at least 1/2 hour a day outside for      improvement in pulmonary valve function.  He should avoid any      lifting greater than 30 pounds for the next couple weeks, and use      pain medication so he can do low impact activity.  5. He should shower daily, keep the incisions clean and dry, and call      if he has worsening drainage, fever or other concerns.      Mark Sportsman, MD  Electronically Signed     SCG/MEDQ  D:  09/21/2007  T:  09/21/2007  Job:  [id number]   cc:   Mark [name], M.D.  Fax: [phone]   Mark [name], M.D.  Fax: [phone]

## 2010-08-28 NOTE — Consult Note (Signed)
NAME:  Mark [name], Mark [name] NO.:  [id number]   MEDICAL RECORD NO.:  [id number]          PATIENT TYPE:  INP   LOCATION:  1411                         FACILITY:  [facility]   PHYSICIAN:  Corky Crafts, MDDATE OF BIRTH:  1946/02/07   DATE OF CONSULTATION:  03/22/2007  DATE OF DISCHARGE:                                 CONSULTATION   PRIMARY CARE PHYSICIAN:  Dr. Laurann Montana.   REASON FOR CONSULTATION:  1. Chest pain.  2. Prior MI.  3. Abnormal cardiac enzymes.   HISTORY OF PRESENT ILLNESS:  The patient is a 65 year old who was  admitted with vomiting and diarrhea.  He has been very weak prior to  admission.  He also has an ITP, and was found to have platelets of less  than 5000.  He was then treated with steroids, and his platelets have  been increasing.  His prior cardiac history includes a myocardial  infarction back in 1993.  At that time, he was told that he had a blood  clot.  He did not require any angioplasty or stents.  The blood clot was  treated with blood thinners.  He did not have any long-term damage to  his heart.  His catheterization apparently was done at [facility]  He  has been followed there.   He did have an episode of chest pain yesterday.  It felt like a  tightness across his chest.  It lasted about 45 minutes.  It was  relieved with a nitroglycerin.  It did not feel like his prior heart  attack.  He had cardiac enzymes ordered.  The MB fraction was slightly  elevated at 5.8; therefore, we were called to evaluate him.  He has not  had any further chest pain.  He has not had diaphoresis,  lightheadedness, palpitations or syncope.  Currently, he feels well.   PAST MEDICAL HISTORY:  1. ITP.  2. Seizure disorder.  3. Recurrent DVT.  4. Hepatitis B.  5. Depression.  6. Attention-deficit disorder.  7. Hypertension.  8. Diabetes.  9. Sleep apnea.   MEDICATIONS:  1. Strattera.  2. AndroGel.  3. Effexor.  4. Nasal spray.  5.  Lisinopril/hydrochlorothiazide 20/25.  6. Metformin 750 t.i.d.  7. Keppra.  8. Nexium.  9. Calcium.  10.Multivitamin.  11.Phenobarbital.  12.Prednisone.   SOCIAL HISTORY:  He works as a Civil engineer, contracting.  He does not drink  heavily.  He is married.  He quit smoking years ago.   FAMILY HISTORY:  Mother has dementia.  Father died at age 63 with an MI.   PAST SURGICAL HISTORY:  Laparoscopic cholecystectomy.   ALLERGIES:  No known drug allergies.   REVIEW OF SYSTEMS:  No recent fevers or chills.  He has had diarrhea and  nausea.  He has had chest pain as described above.  No significant  shortness of breath.  No focal weakness.  No rash.  All other systems  are negative.   PHYSICAL EXAMINATION:  VITAL SIGNS:  Blood pressure 141/95, pulse 73,  respiratory rate of 16.  He is 98% on room air.  GENERAL:  He is awake, alert, in no apparent distress.  HEENT:  Head:  Normocephalic, atraumatic.  Eyes:  Extraocular movements  intact.  NECK:  No carotid bruits.  CARDIOVASCULAR:  Regular rate and rhythm.  S1, S2.  LUNGS:  Clear to auscultation bilaterally.  ABDOMEN:  Soft, nontender, nondistended.  EXTREMITIES:  Showed no edema.  Palpable dorsalis pedis pulses  bilaterally.  NEURO:  No focal deficits.  SKIN:  No rash.  Some areas of bruising noted on his arms.  PSYCH:  Normal mood and affect.   An EKG shows normal sinus rhythm.  No pathologic T waves.  No ST-T wave  changes.   LABORATORY DATA:  Shows a hematocrit of 33 with platelets of 20,000.  Troponin less than 0.05 x3.  CK was 100 with an MB of 5.3, then CK of  101 with an MB of 5.8, then a CK of 106 with an MB of 6.9.   MEDICAL DECISION MAKING:  A 65 year old with prior myocardial infarction  and risk factors for heart disease.   PLAN:  1. Slightly increase MB with low-total CK.  Index may not be reliable      in that situation.  We will obtain a 2-D echocardiogram.  If he has      normal LV function, then it is very  unlikely that he has had any      significant ischemia.  He is symptom free.  I would not      anticoagulate this patient given his ITP.  2. Decreased platelets secondary to ITP.  He is on steroids.  I would      certainly not perform any invasive cardiac workup until his      platelets were higher and more stable.  3. Continue aggressive diabetes control.  4. Continue aggressive blood pressure control.  5. Continue therapy for sleep apnea.  6. His echo will be performed tomorrow.      Corky Crafts, MD  Electronically Signed     JSV/MEDQ  D:  03/22/2007  T:  03/22/2007  Job:  [id number]

## 2010-08-28 NOTE — Discharge Summary (Signed)
Mark [name], LITTLER NO.:  [id number]   MEDICAL RECORD NO.:  [id number]          PATIENT TYPE:  INP   LOCATION:  1411                         FACILITY:  [facility]   PHYSICIAN:  Kela Millin, M.D.DATE OF BIRTH:  05/06/1945   DATE OF ADMISSION:  03/18/2007  DATE OF DISCHARGE:  03/23/2007                               DISCHARGE SUMMARY   DISCHARGE DIAGNOSES:  1. Idiopathic thrombocytopenia/severe thrombocytopenia.  2. Gastroenteritis - likely viral, resolved.  3. Acute renal failure - resolved.  4. Diabetes mellitus.  5. History of seizure disorder.  6. History of myocardial infarction.  7. History of recurrent deep vein thrombosis.  8. History of hepatitis B.  9. Hypertension.  10.History of obstructive sleep apnea.  11.History of attention deficit disorder and depression.   PROCEDURES AND STUDIES:  A 2-D echo - Overall left ventricular systolic  function was normal, EF 55% to 60%, no diagnostic evidence of left  ventricular regional wall motion abnormalities.   CONSULTATION:  1. Hematology.  2. Cardiology - Dr. Eldridge Dace.   BRIEF HISTORY:  The patient is a 65 year old white male with the above-  listed medical problems, who presented with intractable nausea and  vomiting.  He also reported profuse and explosive watery diarrhea.  He  denied hematochezia, also denied hemoptysis.  He stated he did not  remember eating anything unusual and denied any recent travel.   Please see the full admission history and physical dictated per Dr.  Lendell Caprice on March 18, 2007, for the details of the admission, physical  exam, as well as the laboratory data.   HOSPITAL COURSE:  1. Idiopathic thrombocytopenic purpura - with severe thrombocytopenia.      The patient's platelet count on admission was 37, but on re-check      on the next hospital day, it was down to five.  The patient      indicated that he was followed by Dr. Myna Hidalgo, and so hematology      was  consulted, and the patient was started on IV Decadron of 40 mg      daily for 4 days.  The patient's platelet count gradually improved.      He did not have any gross bleeding during his hospital stay.  On      December 8, his platelet count was 46,000, and hematology stated      that it was okay for him to be discharged from this standpoint,      with outpatient follow-up.  He was discharged on prednisone 40      daily, and he is was to follow up at the clinic as directed.  2. Gastroenteritis - likely viral.  Stool studies were done during his      hospital stay, and the C. diff was negative.  Also, the cultures      were negative.  He was adequately hydrated, and his diarrhea,      nausea, vomiting all resolved.  He was started on a liquid diet      which was advanced as tolerated, to a diabetic diet.  He was  tolerating his diet, prior to discharge.  3. Acute renal failure - the patient's creatinine on admission was      2.91, and with hydration it improved to 1.08 prior to discharge.      The impression was that this was pre-renal, secondary to the      gastroenteritis.  4. History of myocardial infarction - The patient had some chest      pressure in the hospital, and cardiac enzymes were done, and they      revealed mildly elevated CKs.  A 2-D echo was obtained and the      results as stated above.  Cardiology saw the patient, and the      impression was that it was just the MBs that were mildly elevated,      with low total CKs and index not very reliable.  He agreed with a 2-      D echo, and following review of the 2-D echo, he stated that it was      okay to discharge the patient home and that especially given his      low platelets, he will not be a candidate for invasive cardiac      workup.  His chest pain had resolved.  The patient was to follow up      with his cardiologist at [facility] oral to see Dr. Eldridge Dace as      needed.  5. Hypertension - The patient's  lisinopril/HCTZ was held upon      admission, given his acute renal failure on presentation.  His      blood pressures were monitored during his hospital stay and      remained stable.  His renal failure had resolved prior to      discharge, and so he was to resume his pre-admission medications      and follow up with his primary care physician.  6. Diabetes mellitus - During his hospital stay, his Accu-Checks were      monitored.  He was covered with sliding scale insulin.  He was to      continue his outpatient medications upon discharge.   DISCHARGE MEDICATIONS:  1. Prednisone 40 mg with food daily.  2. The patient to continue Strattera 120 mg daily.  3. Effexor 75 mg q.h.s.  4. AndroGel daily.  5. Fortical nasal spray daily.  6. Lisinopril/HCTZ 20/25 mg daily.  7. Keppra 500 mg, 3 tablets b.i.d.  8. Metformin 750 mg, 3 tablets daily.  9. Nexium 40 mg daily.   Patient instructed to discontinue ibuprofen.   FOLLOW-UP CARE:  1. Dr. Myna Hidalgo - Patient to call for appointment upon discharge.  2. Cardiologist at [facility] as scheduled.  3. Dr. Laurann Montana in 1 to 2 weeks.   DISCHARGE CONDITION:  Improved/stable.      Kela Millin, M.D.  Electronically Signed     ACV/MEDQ  D:  05/14/2007  T:  05/14/2007  Job:  [id number]   cc:   Stacie [place]res. Cliffton Asters, M.D.  Fax: [phone]   Rose Phi. Myna Hidalgo, M.D.  Fax: [phone]

## 2010-09-03 ENCOUNTER — Encounter (HOSPITAL_BASED_OUTPATIENT_CLINIC_OR_DEPARTMENT_OTHER): Payer: Medicare Other | Admitting: Hematology & Oncology

## 2010-09-03 ENCOUNTER — Other Ambulatory Visit: Payer: Self-pay | Admitting: Hematology & Oncology

## 2010-09-03 DIAGNOSIS — R894 Abnormal immunological findings in specimens from other organs, systems and tissues: Secondary | ICD-10-CM

## 2010-09-03 DIAGNOSIS — D6859 Other primary thrombophilia: Secondary | ICD-10-CM

## 2010-09-03 DIAGNOSIS — I82409 Acute embolism and thrombosis of unspecified deep veins of unspecified lower extremity: Secondary | ICD-10-CM

## 2010-09-03 DIAGNOSIS — D693 Immune thrombocytopenic purpura: Secondary | ICD-10-CM

## 2010-09-03 DIAGNOSIS — Z7901 Long term (current) use of anticoagulants: Secondary | ICD-10-CM

## 2010-09-03 LAB — CBC WITH DIFFERENTIAL (CANCER CENTER ONLY)
BASO#: 0 10*3/uL (ref 0.0–0.2)
Eosinophils Absolute: 0.4 10*3/uL (ref 0.0–0.5)
HCT: 45.7 % (ref 38.7–49.9)
HGB: 15.7 g/dL (ref 13.0–17.1)
LYMPH%: 39.9 % (ref 14.0–48.0)
MCH: 29.4 pg (ref 28.0–33.4)
MCV: 86 fL (ref 82–98)
MONO%: 19 % — ABNORMAL HIGH (ref 0.0–13.0)
NEUT%: 34.9 % — ABNORMAL LOW (ref 40.0–80.0)
RBC: 5.34 10*6/uL (ref 4.20–5.70)

## 2010-09-03 LAB — CHCC SATELLITE - SMEAR

## 2010-09-06 ENCOUNTER — Encounter (HOSPITAL_BASED_OUTPATIENT_CLINIC_OR_DEPARTMENT_OTHER): Payer: Medicare Other | Admitting: Hematology & Oncology

## 2010-09-06 ENCOUNTER — Other Ambulatory Visit: Payer: Self-pay | Admitting: Hematology & Oncology

## 2010-09-06 DIAGNOSIS — R894 Abnormal immunological findings in specimens from other organs, systems and tissues: Secondary | ICD-10-CM

## 2010-09-06 DIAGNOSIS — Z86718 Personal history of other venous thrombosis and embolism: Secondary | ICD-10-CM

## 2010-09-06 DIAGNOSIS — I82409 Acute embolism and thrombosis of unspecified deep veins of unspecified lower extremity: Secondary | ICD-10-CM

## 2010-09-06 DIAGNOSIS — D6859 Other primary thrombophilia: Secondary | ICD-10-CM

## 2010-09-06 DIAGNOSIS — D693 Immune thrombocytopenic purpura: Secondary | ICD-10-CM

## 2010-09-06 DIAGNOSIS — Z7901 Long term (current) use of anticoagulants: Secondary | ICD-10-CM

## 2010-09-06 LAB — CBC WITH DIFFERENTIAL (CANCER CENTER ONLY)
BASO%: 0.1 % (ref 0.0–2.0)
Eosinophils Absolute: 0 10*3/uL (ref 0.0–0.5)
HCT: 43.2 % (ref 38.7–49.9)
HGB: 15.1 g/dL (ref 13.0–17.1)
LYMPH#: 1.9 10*3/uL (ref 0.9–3.3)
LYMPH%: 10.4 % — ABNORMAL LOW (ref 14.0–48.0)
MCV: 84 fL (ref 82–98)
MONO#: 1.1 10*3/uL — ABNORMAL HIGH (ref 0.1–0.9)
NEUT%: 83.3 % — ABNORMAL HIGH (ref 40.0–80.0)
RBC: 5.16 10*6/uL (ref 4.20–5.70)
RDW: 14.6 % (ref 11.1–15.7)
WBC: 18.3 10*3/uL — ABNORMAL HIGH (ref 4.0–10.0)

## 2010-09-11 ENCOUNTER — Other Ambulatory Visit: Payer: Self-pay | Admitting: Hematology & Oncology

## 2010-09-11 ENCOUNTER — Encounter (HOSPITAL_BASED_OUTPATIENT_CLINIC_OR_DEPARTMENT_OTHER): Payer: Medicare Other | Admitting: Hematology & Oncology

## 2010-09-11 DIAGNOSIS — I82409 Acute embolism and thrombosis of unspecified deep veins of unspecified lower extremity: Secondary | ICD-10-CM

## 2010-09-11 DIAGNOSIS — D6859 Other primary thrombophilia: Secondary | ICD-10-CM

## 2010-09-11 DIAGNOSIS — D693 Immune thrombocytopenic purpura: Secondary | ICD-10-CM

## 2010-09-11 DIAGNOSIS — R894 Abnormal immunological findings in specimens from other organs, systems and tissues: Secondary | ICD-10-CM

## 2010-09-11 LAB — CBC WITH DIFFERENTIAL (CANCER CENTER ONLY)
BASO%: 0.1 % (ref 0.0–2.0)
Eosinophils Absolute: 0 10*3/uL (ref 0.0–0.5)
HCT: 42.1 % (ref 38.7–49.9)
LYMPH#: 4.4 10*3/uL — ABNORMAL HIGH (ref 0.9–3.3)
LYMPH%: 22 % (ref 14.0–48.0)
MCV: 86 fL (ref 82–98)
MONO#: 2.3 10*3/uL — ABNORMAL HIGH (ref 0.1–0.9)
Platelets: 400 10*3/uL (ref 145–400)
RBC: 4.88 10*6/uL (ref 4.20–5.70)
RDW: 15 % (ref 11.1–15.7)
WBC: 19.7 10*3/uL — ABNORMAL HIGH (ref 4.0–10.0)

## 2010-09-11 LAB — TECHNOLOGIST REVIEW CHCC SATELLITE

## 2010-09-11 LAB — PROTIME-INR (CHCC SATELLITE)

## 2010-09-20 ENCOUNTER — Other Ambulatory Visit: Payer: Self-pay | Admitting: Hematology & Oncology

## 2010-09-20 ENCOUNTER — Encounter (HOSPITAL_BASED_OUTPATIENT_CLINIC_OR_DEPARTMENT_OTHER): Payer: Medicare Other | Admitting: Hematology & Oncology

## 2010-09-20 DIAGNOSIS — D693 Immune thrombocytopenic purpura: Secondary | ICD-10-CM

## 2010-09-20 DIAGNOSIS — Z7901 Long term (current) use of anticoagulants: Secondary | ICD-10-CM

## 2010-09-20 LAB — CBC WITH DIFFERENTIAL (CANCER CENTER ONLY)
BASO%: 0.1 % (ref 0.0–2.0)
EOS%: 5 % (ref 0.0–7.0)
HCT: 42.8 % (ref 38.7–49.9)
LYMPH#: 1.8 10*3/uL (ref 0.9–3.3)
LYMPH%: 18.5 % (ref 14.0–48.0)
MCHC: 34.3 g/dL (ref 32.0–35.9)
NEUT%: 60.4 % (ref 40.0–80.0)
RDW: 15.5 % (ref 11.1–15.7)

## 2010-10-01 ENCOUNTER — Other Ambulatory Visit: Payer: Self-pay | Admitting: Hematology & Oncology

## 2010-10-01 ENCOUNTER — Encounter (HOSPITAL_BASED_OUTPATIENT_CLINIC_OR_DEPARTMENT_OTHER): Payer: Medicare Other | Admitting: Hematology & Oncology

## 2010-10-01 DIAGNOSIS — D693 Immune thrombocytopenic purpura: Secondary | ICD-10-CM

## 2010-10-01 LAB — CBC WITH DIFFERENTIAL (CANCER CENTER ONLY)
BASO%: 0.6 % (ref 0.0–2.0)
EOS%: 14.1 % — ABNORMAL HIGH (ref 0.0–7.0)
HCT: 42.8 % (ref 38.7–49.9)
LYMPH%: 31.5 % (ref 14.0–48.0)
MCH: 30.1 pg (ref 28.0–33.4)
MCHC: 34.6 g/dL (ref 32.0–35.9)
MCV: 87 fL (ref 82–98)
NEUT%: 35 % — ABNORMAL LOW (ref 40.0–80.0)
RDW: 15 % (ref 11.1–15.7)

## 2010-10-08 ENCOUNTER — Other Ambulatory Visit: Payer: Self-pay | Admitting: Hematology & Oncology

## 2010-10-08 ENCOUNTER — Encounter (HOSPITAL_BASED_OUTPATIENT_CLINIC_OR_DEPARTMENT_OTHER): Payer: Medicare Other | Admitting: Hematology & Oncology

## 2010-10-08 DIAGNOSIS — R894 Abnormal immunological findings in specimens from other organs, systems and tissues: Secondary | ICD-10-CM

## 2010-10-08 DIAGNOSIS — D693 Immune thrombocytopenic purpura: Secondary | ICD-10-CM

## 2010-10-08 DIAGNOSIS — Z7901 Long term (current) use of anticoagulants: Secondary | ICD-10-CM

## 2010-10-08 DIAGNOSIS — Z86718 Personal history of other venous thrombosis and embolism: Secondary | ICD-10-CM

## 2010-10-08 LAB — CBC WITH DIFFERENTIAL (CANCER CENTER ONLY)
BASO%: 0.7 % (ref 0.0–2.0)
Eosinophils Absolute: 0.5 10*3/uL (ref 0.0–0.5)
HCT: 44 % (ref 38.7–49.9)
LYMPH#: 2.7 10*3/uL (ref 0.9–3.3)
LYMPH%: 35.1 % (ref 14.0–48.0)
MCV: 87 fL (ref 82–98)
MONO#: 1.5 10*3/uL — ABNORMAL HIGH (ref 0.1–0.9)
NEUT%: 37.5 % — ABNORMAL LOW (ref 40.0–80.0)
Platelets: 126 10*3/uL — ABNORMAL LOW (ref 145–400)
RBC: 5.07 10*6/uL (ref 4.20–5.70)
RDW: 15.3 % (ref 11.1–15.7)
WBC: 7.6 10*3/uL (ref 4.0–10.0)

## 2010-10-08 LAB — TECHNOLOGIST REVIEW CHCC SATELLITE

## 2010-10-08 LAB — PROTIME-INR (CHCC SATELLITE)

## 2010-10-15 ENCOUNTER — Other Ambulatory Visit: Payer: Self-pay | Admitting: Hematology & Oncology

## 2010-10-15 ENCOUNTER — Encounter (HOSPITAL_BASED_OUTPATIENT_CLINIC_OR_DEPARTMENT_OTHER): Payer: Medicare Other | Admitting: Hematology & Oncology

## 2010-10-15 DIAGNOSIS — D693 Immune thrombocytopenic purpura: Secondary | ICD-10-CM

## 2010-10-15 DIAGNOSIS — R894 Abnormal immunological findings in specimens from other organs, systems and tissues: Secondary | ICD-10-CM

## 2010-10-15 DIAGNOSIS — Z86718 Personal history of other venous thrombosis and embolism: Secondary | ICD-10-CM

## 2010-10-15 DIAGNOSIS — Z7901 Long term (current) use of anticoagulants: Secondary | ICD-10-CM

## 2010-10-15 LAB — CBC WITH DIFFERENTIAL (CANCER CENTER ONLY)
BASO%: 0.5 % (ref 0.0–2.0)
EOS%: 6.5 % (ref 0.0–7.0)
LYMPH#: 2.7 10*3/uL (ref 0.9–3.3)
MCHC: 35.3 g/dL (ref 32.0–35.9)
MONO#: 1.4 10*3/uL — ABNORMAL HIGH (ref 0.1–0.9)
NEUT#: 4 10*3/uL (ref 1.5–6.5)
RDW: 15.6 % (ref 11.1–15.7)
WBC: 8.7 10*3/uL (ref 4.0–10.0)

## 2010-10-15 LAB — PROTIME-INR (CHCC SATELLITE): INR: 2.6 (ref 2.0–3.5)

## 2010-10-25 ENCOUNTER — Encounter (HOSPITAL_BASED_OUTPATIENT_CLINIC_OR_DEPARTMENT_OTHER): Payer: Medicare Other | Admitting: Hematology & Oncology

## 2010-10-25 ENCOUNTER — Other Ambulatory Visit: Payer: Self-pay | Admitting: Hematology & Oncology

## 2010-10-25 DIAGNOSIS — D693 Immune thrombocytopenic purpura: Secondary | ICD-10-CM

## 2010-10-25 DIAGNOSIS — R894 Abnormal immunological findings in specimens from other organs, systems and tissues: Secondary | ICD-10-CM

## 2010-10-25 DIAGNOSIS — Z86718 Personal history of other venous thrombosis and embolism: Secondary | ICD-10-CM

## 2010-10-25 DIAGNOSIS — Z7901 Long term (current) use of anticoagulants: Secondary | ICD-10-CM

## 2010-10-25 LAB — CBC WITH DIFFERENTIAL (CANCER CENTER ONLY)
BASO#: 0.1 10*3/uL (ref 0.0–0.2)
EOS%: 3.8 % (ref 0.0–7.0)
Eosinophils Absolute: 0.3 10*3/uL (ref 0.0–0.5)
HCT: 43.6 % (ref 38.7–49.9)
HGB: 15.3 g/dL (ref 13.0–17.1)
LYMPH#: 3 10*3/uL (ref 0.9–3.3)
MONO#: 2 10*3/uL — ABNORMAL HIGH (ref 0.1–0.9)
NEUT#: 3.1 10*3/uL (ref 1.5–6.5)
RBC: 5.05 10*6/uL (ref 4.20–5.70)
WBC: 8.5 10*3/uL (ref 4.0–10.0)

## 2010-11-12 ENCOUNTER — Other Ambulatory Visit: Payer: Self-pay | Admitting: Hematology & Oncology

## 2010-11-12 ENCOUNTER — Encounter (HOSPITAL_BASED_OUTPATIENT_CLINIC_OR_DEPARTMENT_OTHER): Payer: Medicare Other | Admitting: Hematology & Oncology

## 2010-11-12 DIAGNOSIS — Z7901 Long term (current) use of anticoagulants: Secondary | ICD-10-CM

## 2010-11-12 DIAGNOSIS — D693 Immune thrombocytopenic purpura: Secondary | ICD-10-CM

## 2010-11-12 DIAGNOSIS — R894 Abnormal immunological findings in specimens from other organs, systems and tissues: Secondary | ICD-10-CM

## 2010-11-12 DIAGNOSIS — Z86718 Personal history of other venous thrombosis and embolism: Secondary | ICD-10-CM

## 2010-11-12 LAB — CBC WITH DIFFERENTIAL (CANCER CENTER ONLY)
BASO#: 0 10*3/uL (ref 0.0–0.2)
Eosinophils Absolute: 0.3 10*3/uL (ref 0.0–0.5)
HGB: 15.8 g/dL (ref 13.0–17.1)
LYMPH%: 34.6 % (ref 14.0–48.0)
MCH: 30.6 pg (ref 28.0–33.4)
MCV: 88 fL (ref 82–98)
MONO#: 2.2 10*3/uL — ABNORMAL HIGH (ref 0.1–0.9)
MONO%: 15.7 % — ABNORMAL HIGH (ref 0.0–13.0)
RBC: 5.16 10*6/uL (ref 4.20–5.70)
WBC: 14.3 10*3/uL — ABNORMAL HIGH (ref 4.0–10.0)

## 2010-11-19 ENCOUNTER — Encounter (HOSPITAL_BASED_OUTPATIENT_CLINIC_OR_DEPARTMENT_OTHER): Payer: Medicare Other | Admitting: Hematology & Oncology

## 2010-11-19 ENCOUNTER — Other Ambulatory Visit: Payer: Self-pay | Admitting: Hematology & Oncology

## 2010-11-19 LAB — CBC WITH DIFFERENTIAL (CANCER CENTER ONLY)
BASO#: 0 10*3/uL (ref 0.0–0.2)
BASO%: 0.3 % (ref 0.0–2.0)
HCT: 43 % (ref 38.7–49.9)
HGB: 15.1 g/dL (ref 13.0–17.1)
LYMPH#: 2.2 10*3/uL (ref 0.9–3.3)
LYMPH%: 21.8 % (ref 14.0–48.0)
MCHC: 35.1 g/dL (ref 32.0–35.9)
MCV: 87 fL (ref 82–98)
MONO#: 1.9 10*3/uL — ABNORMAL HIGH (ref 0.1–0.9)
NEUT%: 56.1 % (ref 40.0–80.0)
RDW: 15.3 % (ref 11.1–15.7)
WBC: 9.9 10*3/uL (ref 4.0–10.0)

## 2010-11-26 ENCOUNTER — Encounter (HOSPITAL_BASED_OUTPATIENT_CLINIC_OR_DEPARTMENT_OTHER): Payer: Medicare Other | Admitting: Hematology & Oncology

## 2010-11-26 ENCOUNTER — Other Ambulatory Visit: Payer: Self-pay | Admitting: Hematology & Oncology

## 2010-11-26 DIAGNOSIS — Z7901 Long term (current) use of anticoagulants: Secondary | ICD-10-CM

## 2010-11-26 DIAGNOSIS — Z86718 Personal history of other venous thrombosis and embolism: Secondary | ICD-10-CM

## 2010-11-26 DIAGNOSIS — D693 Immune thrombocytopenic purpura: Secondary | ICD-10-CM

## 2010-11-26 DIAGNOSIS — R894 Abnormal immunological findings in specimens from other organs, systems and tissues: Secondary | ICD-10-CM

## 2010-11-26 LAB — CBC WITH DIFFERENTIAL (CANCER CENTER ONLY)
BASO#: 0 10*3/uL (ref 0.0–0.2)
BASO%: 0.2 % (ref 0.0–2.0)
EOS%: 0.2 % (ref 0.0–7.0)
HCT: 40.9 % (ref 38.7–49.9)
HGB: 14.5 g/dL (ref 13.0–17.1)
LYMPH#: 3.7 10*3/uL — ABNORMAL HIGH (ref 0.9–3.3)
LYMPH%: 30.7 % (ref 14.0–48.0)
MCH: 30.4 pg (ref 28.0–33.4)
MCHC: 35.5 g/dL (ref 32.0–35.9)
MONO%: 20 % — ABNORMAL HIGH (ref 0.0–13.0)
NEUT%: 48.9 % (ref 40.0–80.0)
RDW: 14.9 % (ref 11.1–15.7)

## 2010-12-03 ENCOUNTER — Other Ambulatory Visit: Payer: Self-pay | Admitting: Hematology & Oncology

## 2010-12-03 ENCOUNTER — Encounter (HOSPITAL_BASED_OUTPATIENT_CLINIC_OR_DEPARTMENT_OTHER): Payer: Medicare Other | Admitting: Hematology & Oncology

## 2010-12-03 DIAGNOSIS — R894 Abnormal immunological findings in specimens from other organs, systems and tissues: Secondary | ICD-10-CM

## 2010-12-03 DIAGNOSIS — Z7901 Long term (current) use of anticoagulants: Secondary | ICD-10-CM

## 2010-12-03 DIAGNOSIS — D693 Immune thrombocytopenic purpura: Secondary | ICD-10-CM

## 2010-12-03 DIAGNOSIS — Z86718 Personal history of other venous thrombosis and embolism: Secondary | ICD-10-CM

## 2010-12-03 LAB — CBC WITH DIFFERENTIAL (CANCER CENTER ONLY)
BASO#: 0 10*3/uL (ref 0.0–0.2)
BASO%: 0.2 % (ref 0.0–2.0)
EOS%: 1.7 % (ref 0.0–7.0)
HGB: 16.3 g/dL (ref 13.0–17.1)
LYMPH#: 4.5 10*3/uL — ABNORMAL HIGH (ref 0.9–3.3)
MCH: 30.7 pg (ref 28.0–33.4)
MCHC: 35.8 g/dL (ref 32.0–35.9)
MONO%: 19.6 % — ABNORMAL HIGH (ref 0.0–13.0)
NEUT#: 10.1 10*3/uL — ABNORMAL HIGH (ref 1.5–6.5)
Platelets: 305 10*3/uL (ref 145–400)
RDW: 15.8 % — ABNORMAL HIGH (ref 11.1–15.7)

## 2010-12-10 ENCOUNTER — Other Ambulatory Visit: Payer: Self-pay | Admitting: Hematology & Oncology

## 2010-12-10 ENCOUNTER — Encounter (HOSPITAL_BASED_OUTPATIENT_CLINIC_OR_DEPARTMENT_OTHER): Payer: Medicare Other | Admitting: Hematology & Oncology

## 2010-12-10 DIAGNOSIS — Z7901 Long term (current) use of anticoagulants: Secondary | ICD-10-CM

## 2010-12-10 DIAGNOSIS — Z86718 Personal history of other venous thrombosis and embolism: Secondary | ICD-10-CM

## 2010-12-10 DIAGNOSIS — R894 Abnormal immunological findings in specimens from other organs, systems and tissues: Secondary | ICD-10-CM

## 2010-12-10 DIAGNOSIS — I82409 Acute embolism and thrombosis of unspecified deep veins of unspecified lower extremity: Secondary | ICD-10-CM

## 2010-12-10 LAB — CBC WITH DIFFERENTIAL (CANCER CENTER ONLY)
BASO#: 0 10*3/uL (ref 0.0–0.2)
BASO%: 0.5 % (ref 0.0–2.0)
HCT: 40 % (ref 38.7–49.9)
HGB: 14 g/dL (ref 13.0–17.1)
LYMPH#: 2.3 10*3/uL (ref 0.9–3.3)
LYMPH%: 26.7 % (ref 14.0–48.0)
MCHC: 35 g/dL (ref 32.0–35.9)
MCV: 88 fL (ref 82–98)
MONO#: 1.5 10*3/uL — ABNORMAL HIGH (ref 0.1–0.9)
NEUT%: 54 % (ref 40.0–80.0)
RDW: 15.7 % (ref 11.1–15.7)
WBC: 8.7 10*3/uL (ref 4.0–10.0)

## 2010-12-18 ENCOUNTER — Other Ambulatory Visit: Payer: Self-pay | Admitting: Hematology & Oncology

## 2010-12-18 ENCOUNTER — Encounter (HOSPITAL_BASED_OUTPATIENT_CLINIC_OR_DEPARTMENT_OTHER): Payer: Medicare Other | Admitting: Hematology & Oncology

## 2010-12-18 DIAGNOSIS — Z86718 Personal history of other venous thrombosis and embolism: Secondary | ICD-10-CM

## 2010-12-18 DIAGNOSIS — D693 Immune thrombocytopenic purpura: Secondary | ICD-10-CM

## 2010-12-18 DIAGNOSIS — R894 Abnormal immunological findings in specimens from other organs, systems and tissues: Secondary | ICD-10-CM

## 2010-12-18 DIAGNOSIS — Z7901 Long term (current) use of anticoagulants: Secondary | ICD-10-CM

## 2010-12-18 LAB — CBC WITH DIFFERENTIAL (CANCER CENTER ONLY)
BASO#: 0.1 10*3/uL (ref 0.0–0.2)
BASO%: 0.8 % (ref 0.0–2.0)
EOS%: 9.6 % — ABNORMAL HIGH (ref 0.0–7.0)
HCT: 40.7 % (ref 38.7–49.9)
HGB: 14.4 g/dL (ref 13.0–17.1)
LYMPH%: 29.2 % (ref 14.0–48.0)
MCH: 30.8 pg (ref 28.0–33.4)
MCHC: 35.4 g/dL (ref 32.0–35.9)
MONO%: 16.8 % — ABNORMAL HIGH (ref 0.0–13.0)
NEUT%: 43.6 % (ref 40.0–80.0)
RDW: 15 % (ref 11.1–15.7)

## 2010-12-28 ENCOUNTER — Encounter (HOSPITAL_BASED_OUTPATIENT_CLINIC_OR_DEPARTMENT_OTHER): Payer: Medicare Other | Admitting: Hematology & Oncology

## 2010-12-28 ENCOUNTER — Other Ambulatory Visit: Payer: Self-pay | Admitting: Hematology & Oncology

## 2010-12-28 DIAGNOSIS — Z7901 Long term (current) use of anticoagulants: Secondary | ICD-10-CM

## 2010-12-28 DIAGNOSIS — D693 Immune thrombocytopenic purpura: Secondary | ICD-10-CM

## 2010-12-28 DIAGNOSIS — R894 Abnormal immunological findings in specimens from other organs, systems and tissues: Secondary | ICD-10-CM

## 2010-12-28 DIAGNOSIS — Z86718 Personal history of other venous thrombosis and embolism: Secondary | ICD-10-CM

## 2010-12-28 LAB — CBC WITH DIFFERENTIAL (CANCER CENTER ONLY)
BASO#: 0.1 10*3/uL (ref 0.0–0.2)
BASO%: 1 % (ref 0.0–2.0)
EOS%: 4.5 % (ref 0.0–7.0)
HCT: 41.9 % (ref 38.7–49.9)
HGB: 14.8 g/dL (ref 13.0–17.1)
LYMPH%: 29.3 % (ref 14.0–48.0)
MCHC: 35.3 g/dL (ref 32.0–35.9)
MCV: 87 fL (ref 82–98)
NEUT%: 42.9 % (ref 40.0–80.0)
RDW: 15.2 % (ref 11.1–15.7)

## 2010-12-31 ENCOUNTER — Encounter (HOSPITAL_BASED_OUTPATIENT_CLINIC_OR_DEPARTMENT_OTHER): Payer: Medicare Other | Admitting: Hematology & Oncology

## 2010-12-31 ENCOUNTER — Other Ambulatory Visit: Payer: Self-pay | Admitting: Hematology & Oncology

## 2010-12-31 DIAGNOSIS — D693 Immune thrombocytopenic purpura: Secondary | ICD-10-CM

## 2010-12-31 DIAGNOSIS — Z86718 Personal history of other venous thrombosis and embolism: Secondary | ICD-10-CM

## 2010-12-31 DIAGNOSIS — R894 Abnormal immunological findings in specimens from other organs, systems and tissues: Secondary | ICD-10-CM

## 2010-12-31 DIAGNOSIS — Z7901 Long term (current) use of anticoagulants: Secondary | ICD-10-CM

## 2010-12-31 LAB — CBC WITH DIFFERENTIAL (CANCER CENTER ONLY)
BASO#: 0.1 10*3/uL (ref 0.0–0.2)
EOS%: 5.1 % (ref 0.0–7.0)
Eosinophils Absolute: 0.4 10*3/uL (ref 0.0–0.5)
HGB: 14.9 g/dL (ref 13.0–17.1)
LYMPH%: 35.9 % (ref 14.0–48.0)
MCH: 30.8 pg (ref 28.0–33.4)
MCHC: 35 g/dL (ref 32.0–35.9)
MCV: 88 fL (ref 82–98)
MONO%: 21.3 % — ABNORMAL HIGH (ref 0.0–13.0)
NEUT#: 3 10*3/uL (ref 1.5–6.5)

## 2011-01-10 LAB — CBC
HCT: 24.7 — ABNORMAL LOW
HCT: 29.4 — ABNORMAL LOW
HCT: 30 — ABNORMAL LOW
HCT: 30.2 — ABNORMAL LOW
Hemoglobin: 10.2 — ABNORMAL LOW
Hemoglobin: 8.4 — ABNORMAL LOW
Hemoglobin: 9.4 — ABNORMAL LOW
MCHC: 33.7
MCHC: 34
MCHC: 34.1
MCHC: 34.6
MCHC: 34.6
MCV: 89.8
MCV: 90.2
MCV: 91.3
MCV: 92.1
MCV: 93
Platelets: 109 — ABNORMAL LOW
Platelets: 152
Platelets: 168
RBC: 2.68 — ABNORMAL LOW
RBC: 2.96 — ABNORMAL LOW
RBC: 3.59 — ABNORMAL LOW
RDW: 13.9
RDW: 14.1
RDW: 14.2
RDW: 14.5
WBC: 10.9 — ABNORMAL HIGH
WBC: 8.8

## 2011-01-10 LAB — TYPE AND SCREEN
ABO/RH(D): A POS
ABO/RH(D): A POS
Antibody Screen: NEGATIVE
Antibody Screen: NEGATIVE

## 2011-01-10 LAB — PREPARE PLATELET PHERESIS

## 2011-01-10 LAB — URINALYSIS, ROUTINE W REFLEX MICROSCOPIC
Bilirubin Urine: NEGATIVE
Hgb urine dipstick: NEGATIVE
Nitrite: NEGATIVE
Specific Gravity, Urine: 1.012
Urobilinogen, UA: 0.2
pH: 6

## 2011-01-10 LAB — DIFFERENTIAL
Lymphocytes Relative: 26
Monocytes Absolute: 0.6
Monocytes Relative: 15 — ABNORMAL HIGH
Neutro Abs: 2
Neutrophils Relative %: 51

## 2011-01-10 LAB — BASIC METABOLIC PANEL
BUN: 11
CO2: 29
Chloride: 98
Potassium: 3.6

## 2011-01-10 LAB — CK TOTAL AND CKMB (NOT AT ARMC)
CK, MB: 6.5 — ABNORMAL HIGH
Relative Index: 1
Total CK: 629 — ABNORMAL HIGH

## 2011-01-10 LAB — COMPREHENSIVE METABOLIC PANEL
ALT: 27
AST: 26
Albumin: 4
CO2: 26
Calcium: 9.1
Creatinine, Ser: 1.06
GFR calc Af Amer: >60
GFR calc non Af Amer: >60
Sodium: 139
Total Protein: 7.1

## 2011-01-10 LAB — PREPARE RBC (CROSSMATCH)

## 2011-01-10 LAB — CREATININE, SERUM
Creatinine, Ser: 1.76 — ABNORMAL HIGH
GFR calc Af Amer: >60
GFR calc non Af Amer: >60

## 2011-01-10 LAB — POTASSIUM: Potassium: 3.5

## 2011-01-11 ENCOUNTER — Encounter (HOSPITAL_BASED_OUTPATIENT_CLINIC_OR_DEPARTMENT_OTHER): Payer: Medicare Other | Admitting: Hematology & Oncology

## 2011-01-11 ENCOUNTER — Other Ambulatory Visit: Payer: Self-pay | Admitting: Hematology & Oncology

## 2011-01-11 DIAGNOSIS — D693 Immune thrombocytopenic purpura: Secondary | ICD-10-CM

## 2011-01-11 DIAGNOSIS — Z7901 Long term (current) use of anticoagulants: Secondary | ICD-10-CM

## 2011-01-11 DIAGNOSIS — R894 Abnormal immunological findings in specimens from other organs, systems and tissues: Secondary | ICD-10-CM

## 2011-01-11 DIAGNOSIS — Z86718 Personal history of other venous thrombosis and embolism: Secondary | ICD-10-CM

## 2011-01-11 LAB — CBC WITH DIFFERENTIAL (CANCER CENTER ONLY)
BASO#: 0 10*3/uL (ref 0.0–0.2)
Eosinophils Absolute: 0.4 10*3/uL (ref 0.0–0.5)
HGB: 15.3 g/dL (ref 13.0–17.1)
LYMPH%: 35 % (ref 14.0–48.0)
MCH: 31 pg (ref 28.0–33.4)
MCV: 88 fL (ref 82–98)
MONO%: 18.8 % — ABNORMAL HIGH (ref 0.0–13.0)
NEUT%: 42 % (ref 40.0–80.0)
RBC: 4.94 10*6/uL (ref 4.20–5.70)

## 2011-01-18 ENCOUNTER — Encounter (HOSPITAL_BASED_OUTPATIENT_CLINIC_OR_DEPARTMENT_OTHER): Payer: Medicare Other | Admitting: Hematology & Oncology

## 2011-01-18 ENCOUNTER — Other Ambulatory Visit: Payer: Self-pay | Admitting: Hematology & Oncology

## 2011-01-18 DIAGNOSIS — D693 Immune thrombocytopenic purpura: Secondary | ICD-10-CM

## 2011-01-18 DIAGNOSIS — R894 Abnormal immunological findings in specimens from other organs, systems and tissues: Secondary | ICD-10-CM

## 2011-01-18 DIAGNOSIS — Z86718 Personal history of other venous thrombosis and embolism: Secondary | ICD-10-CM

## 2011-01-18 DIAGNOSIS — Z7901 Long term (current) use of anticoagulants: Secondary | ICD-10-CM

## 2011-01-18 LAB — CBC WITH DIFFERENTIAL (CANCER CENTER ONLY)
Eosinophils Absolute: 0.4 10*3/uL (ref 0.0–0.5)
LYMPH%: 42.9 % (ref 14.0–48.0)
MCH: 31 pg (ref 28.0–33.4)
MCHC: 35.3 g/dL (ref 32.0–35.9)
MCV: 88 fL (ref 82–98)
MONO%: 15.8 % — ABNORMAL HIGH (ref 0.0–13.0)
Platelets: 124 10*3/uL — ABNORMAL LOW (ref 145–400)
RBC: 5.06 10*6/uL (ref 4.20–5.70)
RDW: 15.3 % (ref 11.1–15.7)

## 2011-01-21 LAB — CBC
HCT: 34.1 — ABNORMAL LOW
HCT: 34.2 — ABNORMAL LOW
HCT: 34.7 — ABNORMAL LOW
HCT: 54 — ABNORMAL HIGH
Hemoglobin: 13.7
Hemoglobin: 19 — ABNORMAL HIGH
MCHC: 34.4
MCHC: 34.9
MCHC: 35.1
MCHC: 35.7
MCV: 89.3
MCV: 89.5
MCV: 90.3
MCV: 90.5
MCV: 90.6
MCV: 91.1
Platelets: 20 — CL
Platelets: 21 — CL
Platelets: 37 — CL
Platelets: 46 — CL
RBC: 3.69 — ABNORMAL LOW
RBC: 3.79 — ABNORMAL LOW
RBC: 3.82 — ABNORMAL LOW
RBC: 3.88 — ABNORMAL LOW
RBC: 4.37
RBC: 5.96 — ABNORMAL HIGH
RDW: 13.3
WBC: 12 — ABNORMAL HIGH
WBC: 18.4 — ABNORMAL HIGH
WBC: 7
WBC: 7.4

## 2011-01-21 LAB — BASIC METABOLIC PANEL
BUN: 13
BUN: 17
BUN: 18
CO2: 23
CO2: 24
CO2: 26
Calcium: 8.5
Calcium: 9.6
Chloride: 106
Chloride: 110
Chloride: 111
Chloride: 112
Creatinine, Ser: 0.95
Creatinine, Ser: 1
Creatinine, Ser: 1.08
Creatinine, Ser: 2.91 — ABNORMAL HIGH
GFR calc Af Amer: 27 — ABNORMAL LOW
GFR calc Af Amer: 44 — ABNORMAL LOW
GFR calc Af Amer: >60
GFR calc Af Amer: >60
GFR calc non Af Amer: >60
Potassium: 3.6
Potassium: 3.9
Potassium: 4

## 2011-01-21 LAB — BASIC METABOLIC PANEL WITH GFR
BUN: 41 — ABNORMAL HIGH
Chloride: 102
GFR calc non Af Amer: 22 — ABNORMAL LOW
Glucose, Bld: 193 — ABNORMAL HIGH
Potassium: 4.8
Sodium: 140

## 2011-01-21 LAB — DIFFERENTIAL
Basophils Absolute: 0
Basophils Relative: 0
Basophils Relative: 0
Basophils Relative: 0
Basophils Relative: 0
Eosinophils Absolute: 0 — ABNORMAL LOW
Eosinophils Relative: 0
Eosinophils Relative: 0
Eosinophils Relative: 2
Lymphocytes Relative: 4 — ABNORMAL LOW
Lymphs Abs: 0.7
Lymphs Abs: 0.7
Lymphs Abs: 1.7
Lymphs Abs: 2
Monocytes Absolute: 0.3
Monocytes Absolute: 0.9
Monocytes Absolute: 1.1 — ABNORMAL HIGH
Monocytes Absolute: 1.5 — ABNORMAL HIGH
Monocytes Relative: 12
Monocytes Relative: 8
Neutro Abs: 16.2 — ABNORMAL HIGH
Neutro Abs: 4.8
Neutro Abs: 7.7
Neutrophils Relative %: 88 — ABNORMAL HIGH

## 2011-01-21 LAB — CARDIAC PANEL(CRET KIN+CKTOT+MB+TROPI)
CK, MB: 6.9 — ABNORMAL HIGH
Relative Index: 5.3 — ABNORMAL HIGH
Relative Index: 5.7 — ABNORMAL HIGH
Relative Index: 6.5 — ABNORMAL HIGH
Total CK: 101
Troponin I: 0.01
Troponin I: 0.02
Troponin I: <0.01

## 2011-01-21 LAB — URINE MICROSCOPIC-ADD ON

## 2011-01-21 LAB — URINALYSIS, ROUTINE W REFLEX MICROSCOPIC
Protein, ur: 30 — AB
Urobilinogen, UA: 0.2

## 2011-01-21 LAB — HEPATIC FUNCTION PANEL
Albumin: 3.2 — ABNORMAL LOW
Bilirubin, Direct: 0.2
Total Bilirubin: 1.3 — ABNORMAL HIGH

## 2011-01-21 LAB — LIPASE, BLOOD: Lipase: 19

## 2011-01-21 LAB — HEMOGLOBIN A1C: Hgb A1c MFr Bld: 5.2

## 2011-01-21 LAB — MAGNESIUM: Magnesium: 2

## 2011-01-21 LAB — FECAL LACTOFERRIN, QUANT: Fecal Lactoferrin: POSITIVE

## 2011-01-21 LAB — CLOSTRIDIUM DIFFICILE EIA

## 2011-01-21 LAB — AMYLASE: Amylase: 58

## 2011-01-21 LAB — STOOL CULTURE

## 2011-01-21 LAB — TROPONIN I: Troponin I: 0.02

## 2011-02-06 ENCOUNTER — Other Ambulatory Visit: Payer: Self-pay | Admitting: Hematology & Oncology

## 2011-02-06 ENCOUNTER — Encounter (HOSPITAL_BASED_OUTPATIENT_CLINIC_OR_DEPARTMENT_OTHER): Payer: Medicare Other | Admitting: Hematology & Oncology

## 2011-02-06 LAB — CBC WITH DIFFERENTIAL (CANCER CENTER ONLY)
BASO%: 0.5 % (ref 0.0–2.0)
Eosinophils Absolute: 0.4 10*3/uL (ref 0.0–0.5)
HCT: 43.6 % (ref 38.7–49.9)
HGB: 15.4 g/dL (ref 13.0–17.1)
LYMPH#: 3.6 10*3/uL — ABNORMAL HIGH (ref 0.9–3.3)
LYMPH%: 35.8 % (ref 14.0–48.0)
MCV: 87 fL (ref 82–98)
MONO#: 2.1 10*3/uL — ABNORMAL HIGH (ref 0.1–0.9)
NEUT%: 38.8 % — ABNORMAL LOW (ref 40.0–80.0)
RBC: 5 10*6/uL (ref 4.20–5.70)
RDW: 14.9 % (ref 11.1–15.7)
WBC: 10.1 10*3/uL — ABNORMAL HIGH (ref 4.0–10.0)

## 2011-02-11 ENCOUNTER — Other Ambulatory Visit: Payer: Self-pay | Admitting: Hematology & Oncology

## 2011-02-11 ENCOUNTER — Encounter (HOSPITAL_BASED_OUTPATIENT_CLINIC_OR_DEPARTMENT_OTHER): Payer: Medicare Other | Admitting: Hematology & Oncology

## 2011-02-11 DIAGNOSIS — D473 Essential (hemorrhagic) thrombocythemia: Secondary | ICD-10-CM

## 2011-02-11 DIAGNOSIS — D693 Immune thrombocytopenic purpura: Secondary | ICD-10-CM

## 2011-02-11 DIAGNOSIS — D591 Autoimmune hemolytic anemia, unspecified: Secondary | ICD-10-CM

## 2011-02-11 DIAGNOSIS — D649 Anemia, unspecified: Secondary | ICD-10-CM

## 2011-02-11 DIAGNOSIS — D696 Thrombocytopenia, unspecified: Secondary | ICD-10-CM

## 2011-02-11 LAB — CBC WITH DIFFERENTIAL (CANCER CENTER ONLY)
BASO#: 0 10*3/uL (ref 0.0–0.2)
BASO%: 0.5 % (ref 0.0–2.0)
HCT: 43 % (ref 38.7–49.9)
HGB: 15.3 g/dL (ref 13.0–17.1)
LYMPH#: 2.9 10*3/uL (ref 0.9–3.3)
LYMPH%: 38.5 % (ref 14.0–48.0)
MCHC: 35.6 g/dL (ref 32.0–35.9)
MCV: 87 fL (ref 82–98)
MONO#: 1.5 10*3/uL — ABNORMAL HIGH (ref 0.1–0.9)
MONO%: 20.7 % — ABNORMAL HIGH (ref 0.0–13.0)
NEUT%: 36 % — ABNORMAL LOW (ref 40.0–80.0)

## 2011-02-18 ENCOUNTER — Other Ambulatory Visit: Payer: Self-pay | Admitting: Hematology & Oncology

## 2011-02-18 ENCOUNTER — Other Ambulatory Visit (HOSPITAL_BASED_OUTPATIENT_CLINIC_OR_DEPARTMENT_OTHER): Payer: Medicare Other | Admitting: Lab

## 2011-02-18 ENCOUNTER — Ambulatory Visit: Payer: Medicare Other

## 2011-02-18 DIAGNOSIS — D591 Autoimmune hemolytic anemia, unspecified: Secondary | ICD-10-CM

## 2011-02-18 DIAGNOSIS — D693 Immune thrombocytopenic purpura: Secondary | ICD-10-CM

## 2011-02-18 LAB — CBC WITH DIFFERENTIAL (CANCER CENTER ONLY)
BASO%: 0.6 % (ref 0.0–2.0)
Eosinophils Absolute: 0.4 10*3/uL (ref 0.0–0.5)
LYMPH#: 3 10*3/uL (ref 0.9–3.3)
LYMPH%: 37.3 % (ref 14.0–48.0)
MCV: 88 fL (ref 82–98)
MONO#: 1.4 10*3/uL — ABNORMAL HIGH (ref 0.1–0.9)
NEUT#: 3.3 10*3/uL (ref 1.5–6.5)
Platelets: 224 10*3/uL (ref 145–400)
RBC: 5.06 10*6/uL (ref 4.20–5.70)
WBC: 8.1 10*3/uL (ref 4.0–10.0)

## 2011-02-18 NOTE — Progress Notes (Signed)
Pt was scheduled for N-plate today but because of lab results was not needed.  Dr. Myna Hidalgo aware.

## 2011-02-25 ENCOUNTER — Encounter: Payer: Self-pay | Admitting: *Deleted

## 2011-02-27 DIAGNOSIS — I639 Cerebral infarction, unspecified: Secondary | ICD-10-CM

## 2011-02-27 HISTORY — DX: Cerebral infarction, unspecified: I63.9

## 2011-03-04 ENCOUNTER — Ambulatory Visit: Payer: Medicare Other

## 2011-03-04 ENCOUNTER — Other Ambulatory Visit: Payer: Medicare Other | Admitting: Lab

## 2011-03-26 ENCOUNTER — Other Ambulatory Visit: Payer: Self-pay | Admitting: *Deleted

## 2011-03-26 ENCOUNTER — Ambulatory Visit: Payer: Medicare Other | Attending: Family Medicine | Admitting: Physical Therapy

## 2011-03-26 ENCOUNTER — Ambulatory Visit: Payer: Medicare Other | Admitting: Occupational Therapy

## 2011-03-26 DIAGNOSIS — I69998 Other sequelae following unspecified cerebrovascular disease: Secondary | ICD-10-CM | POA: Insufficient documentation

## 2011-03-26 DIAGNOSIS — M6281 Muscle weakness (generalized): Secondary | ICD-10-CM | POA: Insufficient documentation

## 2011-03-26 DIAGNOSIS — R279 Unspecified lack of coordination: Secondary | ICD-10-CM | POA: Insufficient documentation

## 2011-03-26 DIAGNOSIS — I69919 Unspecified symptoms and signs involving cognitive functions following unspecified cerebrovascular disease: Secondary | ICD-10-CM | POA: Insufficient documentation

## 2011-03-26 DIAGNOSIS — I639 Cerebral infarction, unspecified: Secondary | ICD-10-CM

## 2011-03-26 DIAGNOSIS — Z5189 Encounter for other specified aftercare: Secondary | ICD-10-CM | POA: Insufficient documentation

## 2011-03-26 DIAGNOSIS — D693 Immune thrombocytopenic purpura: Secondary | ICD-10-CM

## 2011-03-26 DIAGNOSIS — R5381 Other malaise: Secondary | ICD-10-CM | POA: Insufficient documentation

## 2011-03-26 DIAGNOSIS — R269 Unspecified abnormalities of gait and mobility: Secondary | ICD-10-CM | POA: Insufficient documentation

## 2011-03-26 DIAGNOSIS — I69959 Hemiplegia and hemiparesis following unspecified cerebrovascular disease affecting unspecified side: Secondary | ICD-10-CM | POA: Insufficient documentation

## 2011-03-26 NOTE — Progress Notes (Signed)
Received a call from the pt's wife stating he needed to come in for a platelet and INR check as he is now home from his hospital and rehab stay in [place] s/p CVA. Wants to come in tomorrow or Thurs after 10 am. The pt is unable to drive so it is easier for her to bring him in a little later. Also wanted to let Dr Myna Hidalgo to know that they are working on an appt with Dr Kerry Dory at [facility]. She was recommended by a friend. They want to be clear that they completely trust Dr Myna Hidalgo but are concerned that he has had 2 strokes and "think that 2 heads are better than 1". Will pass the message along to Dr Myna Hidalgo and ask the scheduler to make the lab only appt as requested.

## 2011-03-28 ENCOUNTER — Other Ambulatory Visit: Payer: Self-pay | Admitting: *Deleted

## 2011-03-28 ENCOUNTER — Ambulatory Visit: Payer: Medicare Other | Admitting: Occupational Therapy

## 2011-03-28 ENCOUNTER — Other Ambulatory Visit (HOSPITAL_BASED_OUTPATIENT_CLINIC_OR_DEPARTMENT_OTHER): Payer: Medicare Other | Admitting: Lab

## 2011-03-28 ENCOUNTER — Ambulatory Visit (HOSPITAL_BASED_OUTPATIENT_CLINIC_OR_DEPARTMENT_OTHER): Payer: Medicare Other | Admitting: Hematology & Oncology

## 2011-03-28 ENCOUNTER — Encounter: Payer: Medicare Other | Admitting: *Deleted

## 2011-03-28 VITALS — BP 106/65 | HR 69 | Temp 97.0°F

## 2011-03-28 DIAGNOSIS — D693 Immune thrombocytopenic purpura: Secondary | ICD-10-CM

## 2011-03-28 DIAGNOSIS — I639 Cerebral infarction, unspecified: Secondary | ICD-10-CM

## 2011-03-28 DIAGNOSIS — D689 Coagulation defect, unspecified: Secondary | ICD-10-CM

## 2011-03-28 LAB — CBC WITH DIFFERENTIAL (CANCER CENTER ONLY)
BASO#: 0 10*3/uL (ref 0.0–0.2)
Eosinophils Absolute: 0.3 10*3/uL (ref 0.0–0.5)
HCT: 44.6 % (ref 38.7–49.9)
HGB: 15.1 g/dL (ref 13.0–17.1)
LYMPH#: 3.3 10*3/uL (ref 0.9–3.3)
LYMPH%: 24.9 % (ref 14.0–48.0)
MCV: 91 fL (ref 82–98)
MONO#: 1.8 10*3/uL — ABNORMAL HIGH (ref 0.1–0.9)
NEUT%: 59 % (ref 40.0–80.0)
RBC: 4.91 10*6/uL (ref 4.20–5.70)
WBC: 13.2 10*3/uL — ABNORMAL HIGH (ref 4.0–10.0)

## 2011-03-28 LAB — PROTIME-INR (CHCC SATELLITE)

## 2011-03-28 MED ORDER — ROSUVASTATIN CALCIUM 20 MG PO TABS: 20.0000 mg | ORAL_TABLET | Freq: Every day | ORAL | Status: DC

## 2011-03-28 NOTE — Progress Notes (Signed)
DIAGNOSES: 1. 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 2. Immune thrombocytopenia. 3. Lupus anticoagulant positivity.  CURRENT THERAPY: 1. Nplate as indicated for platelet count less than 150. 2. Coumadin 2 mg p.o. daily.  INTERIM HISTORY:  Mr. Lofstrom comes in for an unscheduled visit. Shockingly enough, he was up in [place] doing work.  It seems as if whenever Mr. Langenbach is out of town, something seems to happen to him. This time, he had a CVA involving the right carotid artery.  He had carotid Dopplers which showed highly significant stenosis.  He is going to go out to [facility] to have a right carotid endarterectomy probably within the next few weeks.  He was up in [place] for about a month.  He had extensive testing done. He was not thrombocytotic.  He was not thrombocytopenic.  There was no bleeding.  He had been on Coumadin since we have been seeing him.  When we put him on Coumadin last year, he had been doing well.  He does tend to occasionally get an exacerbation of his immune thrombocytopenia, but he has never had any bleeding issues.  I am not sure exactly what happened, but during the summer, he stopped his Coumadin.  He is to get an appointment to see Dr. Marcheta Grammes over at [facility].  This I think is a good idea.  I will speak to her to let her know what's going on.  He still has some weakness in his left arm.  There is some slight left leg weakness, but this is improving.  He does have some left-sided neglect.  He is eating okay.  He has not had problems with his blood sugars.  He has not had any seizure issues.  He is on Keppra. He is not on any type of statin drug.  This may not be a bad idea as studies have seemed to show an improvement in arterial plaques with statin drugs.  We will see about getting him on Crestor.  He has had no double vision or blurred vision.  PHYSICAL EXAM:  General:  This is a  fairly well-developed, well- nourished white gentleman in no obvious distress.  There is some residual left-sided weakness.  Vital Signs:  Temperature 97, pulse 69, respiratory rate 18, blood pressure 106/65.  Head/Neck:  Exam shows a normocephalic, atraumatic skull.  There are no ocular or oral lesions. He has no adenopathy in his neck.  He has good gag reflex.  Lungs: Clear bilaterally.  Cardiac:  Regular rate and rhythm with a normal S1, S2.  There are no murmurs, rubs or bruits.  Abdomen:  Soft with good bowel sounds.  There is no palpable abdominal mass.  There is no fluid wave.  There is no palpable hepatosplenomegaly.  Back: No tenderness over the spine, ribs, or hips.  Extremities:  No clubbing, cyanosis or edema.  There is some weakness on his left side.  He has greater weakness in his left than left leg.  He has good range of motion of his joints.  He has good pulses.  Neurologic:  Exam does show the left-sided weakness.  LABORATORY STUDIES:  White cell count 13, hemoglobin 15.1, hematocrit 44.6, platelet count 335.  INR is 2.1.  IMPRESSION:  Mr. Pore is a 65 year old gentleman with 2 totally "opposite" hematologic issues.  He has refractory immune thrombocytopenia.  This really has not been an issue for him.  Nplate has always worked well  for him.  On the "opposite end of the spectrum" is the fact that he is hypocoagulable.  He does have the lupus anticoagulant but I have to really believe that this stroke was initiated by the right carotid stenosis.  We will have to follow his INR closely.  We will see about checking him weekly for now.  We do have to balance out any exasperation of his thrombocytopenia.  I just do not see that he will be at a higher risk of bleeding while on Coumadin even if he does have exasperation of his ITP.  We will get some Crestor called in for him.  I do not think this would be a bad idea.  We will see Mr. Wilsey back ourselves in  another 4-6 weeks.  We will see about the appointment with Dr. Marcheta Grammes in the interim.    ______________________________ Josph Macho, M.D. PRE/MEDQ  D:  03/28/2011  T:  03/28/2011  Job:  [id number]

## 2011-03-28 NOTE — Progress Notes (Signed)
This office note has been dictated.

## 2011-03-28 NOTE — Progress Notes (Signed)
Addended by: Arlan Organ R on: 03/28/2011 05:32 PM   Modules accepted: Orders, Medications

## 2011-04-01 ENCOUNTER — Ambulatory Visit: Payer: Medicare Other | Admitting: Occupational Therapy

## 2011-04-01 ENCOUNTER — Ambulatory Visit: Payer: Medicare Other | Admitting: Physical Therapy

## 2011-04-02 ENCOUNTER — Telehealth: Payer: Self-pay | Admitting: *Deleted

## 2011-04-02 ENCOUNTER — Other Ambulatory Visit (HOSPITAL_BASED_OUTPATIENT_CLINIC_OR_DEPARTMENT_OTHER): Payer: Medicare Other | Admitting: Lab

## 2011-04-02 DIAGNOSIS — I639 Cerebral infarction, unspecified: Secondary | ICD-10-CM

## 2011-04-02 DIAGNOSIS — I6350 Cerebral infarction due to unspecified occlusion or stenosis of unspecified cerebral artery: Secondary | ICD-10-CM

## 2011-04-02 LAB — CBC WITH DIFFERENTIAL (CANCER CENTER ONLY)
BASO#: 0 10*3/uL (ref 0.0–0.2)
EOS%: 2.8 % (ref 0.0–7.0)
Eosinophils Absolute: 0.3 10*3/uL (ref 0.0–0.5)
HCT: 42.8 % (ref 38.7–49.9)
HGB: 14.6 g/dL (ref 13.0–17.1)
LYMPH#: 3.2 10*3/uL (ref 0.9–3.3)
MONO#: 1.9 10*3/uL — ABNORMAL HIGH (ref 0.1–0.9)
NEUT#: 4 10*3/uL (ref 1.5–6.5)
NEUT%: 42.3 % (ref 40.0–80.0)
RBC: 4.76 10*6/uL (ref 4.20–5.70)

## 2011-04-02 LAB — PROTIME-INR (CHCC SATELLITE): INR: 2.3 (ref 2.0–3.5)

## 2011-04-02 NOTE — Telephone Encounter (Signed)
Left message on voicemail for Dr Dianne Dun nurse Bonnita Levan asking her to please have Dr Marcheta Grammes call Dr Myna Hidalgo to discuss Mr. Cerasoli case. He was seen in f/u today.

## 2011-04-03 ENCOUNTER — Ambulatory Visit: Payer: Medicare Other | Admitting: Physical Therapy

## 2011-04-03 ENCOUNTER — Ambulatory Visit: Payer: Medicare Other | Admitting: *Deleted

## 2011-04-04 ENCOUNTER — Ambulatory Visit: Payer: Medicare Other | Admitting: Physical Therapy

## 2011-04-04 ENCOUNTER — Ambulatory Visit: Payer: Medicare Other | Admitting: Occupational Therapy

## 2011-04-05 ENCOUNTER — Other Ambulatory Visit: Payer: Self-pay | Admitting: *Deleted

## 2011-04-05 DIAGNOSIS — D693 Immune thrombocytopenic purpura: Secondary | ICD-10-CM

## 2011-04-05 MED ORDER — DEXAMETHASONE 4 MG PO TABS: ORAL_TABLET | ORAL | Status: DC

## 2011-04-05 NOTE — Telephone Encounter (Signed)
Pt's wife called concerned that he may be developing petechiae. He has a few areas on his back that have showed up that he could have scratched but is concerned that it sometimes starts that way. She wants to know if Dr Myna Hidalgo will call in Decadron just in case it gets worse until he has his CBC checked on Monday. Ok to do so per Dr Myna Hidalgo. Rx sent via epresribe.

## 2011-04-08 ENCOUNTER — Ambulatory Visit: Payer: Medicare Other | Admitting: Physical Therapy

## 2011-04-08 ENCOUNTER — Other Ambulatory Visit: Payer: Medicare Other | Admitting: Lab

## 2011-04-11 ENCOUNTER — Ambulatory Visit: Payer: Medicare Other | Admitting: Occupational Therapy

## 2011-04-11 ENCOUNTER — Ambulatory Visit: Payer: Medicare Other | Admitting: *Deleted

## 2011-04-11 ENCOUNTER — Ambulatory Visit: Payer: Medicare Other | Admitting: Physical Therapy

## 2011-04-12 ENCOUNTER — Ambulatory Visit: Payer: Medicare Other | Admitting: *Deleted

## 2011-04-15 ENCOUNTER — Ambulatory Visit: Payer: Medicare Other | Admitting: Occupational Therapy

## 2011-04-15 ENCOUNTER — Ambulatory Visit: Payer: Medicare Other | Admitting: Physical Therapy

## 2011-04-15 ENCOUNTER — Other Ambulatory Visit: Payer: Medicare Other | Admitting: Lab

## 2011-04-17 ENCOUNTER — Ambulatory Visit: Payer: Medicare Other | Admitting: Physical Therapy

## 2011-04-17 ENCOUNTER — Encounter: Payer: Medicare Other | Admitting: Occupational Therapy

## 2011-04-18 ENCOUNTER — Ambulatory Visit: Payer: Medicare Other | Attending: Family Medicine | Admitting: Physical Therapy

## 2011-04-18 ENCOUNTER — Ambulatory Visit: Payer: Medicare Other | Admitting: *Deleted

## 2011-04-18 DIAGNOSIS — R269 Unspecified abnormalities of gait and mobility: Secondary | ICD-10-CM | POA: Insufficient documentation

## 2011-04-18 DIAGNOSIS — I69919 Unspecified symptoms and signs involving cognitive functions following unspecified cerebrovascular disease: Secondary | ICD-10-CM | POA: Insufficient documentation

## 2011-04-18 DIAGNOSIS — M6281 Muscle weakness (generalized): Secondary | ICD-10-CM | POA: Insufficient documentation

## 2011-04-18 DIAGNOSIS — R5381 Other malaise: Secondary | ICD-10-CM | POA: Insufficient documentation

## 2011-04-18 DIAGNOSIS — I69959 Hemiplegia and hemiparesis following unspecified cerebrovascular disease affecting unspecified side: Secondary | ICD-10-CM | POA: Insufficient documentation

## 2011-04-18 DIAGNOSIS — Z5189 Encounter for other specified aftercare: Secondary | ICD-10-CM | POA: Insufficient documentation

## 2011-04-18 DIAGNOSIS — R279 Unspecified lack of coordination: Secondary | ICD-10-CM | POA: Insufficient documentation

## 2011-04-18 DIAGNOSIS — I69998 Other sequelae following unspecified cerebrovascular disease: Secondary | ICD-10-CM | POA: Insufficient documentation

## 2011-04-19 ENCOUNTER — Other Ambulatory Visit (HOSPITAL_BASED_OUTPATIENT_CLINIC_OR_DEPARTMENT_OTHER): Payer: Medicare Other | Admitting: Lab

## 2011-04-19 ENCOUNTER — Ambulatory Visit: Payer: Medicare Other | Admitting: *Deleted

## 2011-04-19 ENCOUNTER — Encounter: Payer: Self-pay | Admitting: *Deleted

## 2011-04-19 DIAGNOSIS — I639 Cerebral infarction, unspecified: Secondary | ICD-10-CM

## 2011-04-19 DIAGNOSIS — I6350 Cerebral infarction due to unspecified occlusion or stenosis of unspecified cerebral artery: Secondary | ICD-10-CM

## 2011-04-19 LAB — CBC WITH DIFFERENTIAL (CANCER CENTER ONLY)
BASO#: 0 10*3/uL (ref 0.0–0.2)
EOS%: 3.2 % (ref 0.0–7.0)
HCT: 42.4 % (ref 38.7–49.9)
HGB: 14.6 g/dL (ref 13.0–17.1)
LYMPH#: 2.8 10*3/uL (ref 0.9–3.3)
MCHC: 34.4 g/dL (ref 32.0–35.9)
MONO#: 1.6 10*3/uL — ABNORMAL HIGH (ref 0.1–0.9)
NEUT#: 4.7 10*3/uL (ref 1.5–6.5)
NEUT%: 49.8 % (ref 40.0–80.0)
RBC: 4.82 10*6/uL (ref 4.20–5.70)
WBC: 9.5 10*3/uL (ref 4.0–10.0)

## 2011-04-19 LAB — PROTIME-INR (CHCC SATELLITE): INR: 1.4 — ABNORMAL LOW (ref 2.0–3.5)

## 2011-04-19 NOTE — Progress Notes (Signed)
Per Dr. Myna Hidalgo, left message on pt's home phone that Mr Benko could eat green veggies once a week only.  No change in the Coumadin 2mg  /day.  Needs to check INR next Friday.

## 2011-04-23 ENCOUNTER — Other Ambulatory Visit (HOSPITAL_BASED_OUTPATIENT_CLINIC_OR_DEPARTMENT_OTHER): Payer: Medicare Other | Admitting: Lab

## 2011-04-23 ENCOUNTER — Encounter: Payer: Medicare Other | Admitting: Occupational Therapy

## 2011-04-23 DIAGNOSIS — I639 Cerebral infarction, unspecified: Secondary | ICD-10-CM

## 2011-04-23 DIAGNOSIS — I6350 Cerebral infarction due to unspecified occlusion or stenosis of unspecified cerebral artery: Secondary | ICD-10-CM

## 2011-04-23 LAB — CBC WITH DIFFERENTIAL (CANCER CENTER ONLY)
BASO#: 0 10*3/uL (ref 0.0–0.2)
BASO%: 0.4 % (ref 0.0–2.0)
EOS%: 2.1 % (ref 0.0–7.0)
HGB: 15.1 g/dL (ref 13.0–17.1)
LYMPH#: 3 10*3/uL (ref 0.9–3.3)
MCHC: 34.4 g/dL (ref 32.0–35.9)
MONO#: 1.8 10*3/uL — ABNORMAL HIGH (ref 0.1–0.9)
NEUT#: 5.9 10*3/uL (ref 1.5–6.5)
RDW: 13.4 % (ref 11.1–15.7)
WBC: 10.9 10*3/uL — ABNORMAL HIGH (ref 4.0–10.0)

## 2011-04-23 LAB — PROTIME-INR (CHCC SATELLITE): INR: 1.5 — ABNORMAL LOW (ref 2.0–3.5)

## 2011-04-24 ENCOUNTER — Ambulatory Visit: Payer: Medicare Other | Admitting: Physical Therapy

## 2011-04-24 ENCOUNTER — Ambulatory Visit: Payer: Medicare Other | Admitting: Occupational Therapy

## 2011-04-26 ENCOUNTER — Ambulatory Visit: Payer: Medicare Other | Admitting: Lab

## 2011-04-26 ENCOUNTER — Other Ambulatory Visit: Payer: Self-pay | Admitting: Hematology & Oncology

## 2011-04-26 ENCOUNTER — Other Ambulatory Visit: Payer: Self-pay | Admitting: *Deleted

## 2011-04-26 DIAGNOSIS — I82409 Acute embolism and thrombosis of unspecified deep veins of unspecified lower extremity: Secondary | ICD-10-CM

## 2011-04-26 DIAGNOSIS — I639 Cerebral infarction, unspecified: Secondary | ICD-10-CM

## 2011-04-26 DIAGNOSIS — Z7901 Long term (current) use of anticoagulants: Secondary | ICD-10-CM

## 2011-04-26 LAB — CBC WITH DIFFERENTIAL (CANCER CENTER ONLY)
Eosinophils Absolute: 0.1 10*3/uL (ref 0.0–0.5)
LYMPH#: 2.9 10*3/uL (ref 0.9–3.3)
MCV: 89 fL (ref 82–98)
MONO#: 1.3 10*3/uL — ABNORMAL HIGH (ref 0.1–0.9)
NEUT#: 5.1 10*3/uL (ref 1.5–6.5)
Platelets: 221 10*3/uL (ref 145–400)
RBC: 4.75 10*6/uL (ref 4.20–5.70)
WBC: 9.5 10*3/uL (ref 4.0–10.0)

## 2011-04-26 LAB — PROTIME-INR (CHCC SATELLITE)
INR: 1.2 — ABNORMAL LOW (ref 2.0–3.5)
Protime: 14.4 Seconds — ABNORMAL HIGH (ref 10.6–13.4)

## 2011-04-26 LAB — POCT INR: INR: 1.2

## 2011-04-26 NOTE — Patient Instructions (Signed)
Increase Coumadin from 2 mg to 5 mg daily. Recheck in 5 days per Dr Myna Hidalgo.

## 2011-04-29 ENCOUNTER — Ambulatory Visit: Payer: Medicare Other | Admitting: Physical Therapy

## 2011-04-29 ENCOUNTER — Telehealth: Payer: Self-pay | Admitting: Hematology & Oncology

## 2011-04-29 ENCOUNTER — Ambulatory Visit: Payer: Medicare Other | Admitting: *Deleted

## 2011-04-29 NOTE — Telephone Encounter (Signed)
Pt aware of 1-16 appointment °

## 2011-04-30 ENCOUNTER — Other Ambulatory Visit: Payer: Medicare Other | Admitting: Lab

## 2011-05-01 ENCOUNTER — Ambulatory Visit (HOSPITAL_BASED_OUTPATIENT_CLINIC_OR_DEPARTMENT_OTHER): Payer: Medicare Other | Admitting: Lab

## 2011-05-01 ENCOUNTER — Ambulatory Visit: Payer: Medicare Other | Admitting: *Deleted

## 2011-05-01 ENCOUNTER — Ambulatory Visit: Payer: Medicare Other | Admitting: Physical Therapy

## 2011-05-01 DIAGNOSIS — I639 Cerebral infarction, unspecified: Secondary | ICD-10-CM

## 2011-05-01 DIAGNOSIS — I6350 Cerebral infarction due to unspecified occlusion or stenosis of unspecified cerebral artery: Secondary | ICD-10-CM

## 2011-05-01 LAB — CBC WITH DIFFERENTIAL (CANCER CENTER ONLY)
BASO%: 0.2 % (ref 0.0–2.0)
LYMPH%: 27.9 % (ref 14.0–48.0)
MCH: 30.2 pg (ref 28.0–33.4)
MCV: 88 fL (ref 82–98)
MONO%: 14.6 % — ABNORMAL HIGH (ref 0.0–13.0)
NEUT%: 55.6 % (ref 40.0–80.0)
Platelets: 185 10*3/uL (ref 145–400)
RDW: 13.7 % (ref 11.1–15.7)

## 2011-05-01 LAB — POCT INR: INR: 3.1

## 2011-05-01 LAB — PROTIME-INR (CHCC SATELLITE): Protime: 37.2 Seconds — ABNORMAL HIGH (ref 10.6–13.4)

## 2011-05-02 ENCOUNTER — Ambulatory Visit: Payer: Medicare Other | Admitting: *Deleted

## 2011-05-06 ENCOUNTER — Ambulatory Visit: Payer: Medicare Other | Admitting: *Deleted

## 2011-05-07 ENCOUNTER — Other Ambulatory Visit: Payer: Medicare Other | Admitting: Lab

## 2011-05-07 ENCOUNTER — Ambulatory Visit (HOSPITAL_BASED_OUTPATIENT_CLINIC_OR_DEPARTMENT_OTHER): Payer: Medicare Other | Admitting: Hematology & Oncology

## 2011-05-07 ENCOUNTER — Ambulatory Visit: Payer: Medicare Other | Admitting: Physical Therapy

## 2011-05-07 DIAGNOSIS — I639 Cerebral infarction, unspecified: Secondary | ICD-10-CM

## 2011-05-07 DIAGNOSIS — I6350 Cerebral infarction due to unspecified occlusion or stenosis of unspecified cerebral artery: Secondary | ICD-10-CM

## 2011-05-07 DIAGNOSIS — D693 Immune thrombocytopenic purpura: Secondary | ICD-10-CM

## 2011-05-07 DIAGNOSIS — D6862 Lupus anticoagulant syndrome: Secondary | ICD-10-CM

## 2011-05-07 LAB — PROTHROMBIN TIME
INR: 4.59 — ABNORMAL HIGH (ref <1.50)
Prothrombin Time: 44.7 seconds — ABNORMAL HIGH (ref 11.6–15.2)

## 2011-05-07 LAB — CBC WITH DIFFERENTIAL (CANCER CENTER ONLY)
BASO%: 0.4 % (ref 0.0–2.0)
EOS%: 3.3 % (ref 0.0–7.0)
HCT: 42.7 % (ref 38.7–49.9)
LYMPH#: 2.5 10*3/uL (ref 0.9–3.3)
MCH: 30.1 pg (ref 28.0–33.4)
MCHC: 34.2 g/dL (ref 32.0–35.9)
MONO%: 19.1 % — ABNORMAL HIGH (ref 0.0–13.0)
NEUT#: 3.8 10*3/uL (ref 1.5–6.5)
NEUT%: 47.1 % (ref 40.0–80.0)
RDW: 14 % (ref 11.1–15.7)

## 2011-05-07 LAB — PROTIME-INR (CHCC SATELLITE)

## 2011-05-07 MED ORDER — WARFARIN SODIUM 5 MG PO TABS: 5.0000 mg | ORAL_TABLET | Freq: Every day | ORAL | Status: DC

## 2011-05-07 NOTE — Progress Notes (Signed)
This office note has been dictated.

## 2011-05-07 NOTE — Progress Notes (Signed)
DIAGNOSES: 1. Right middle cerebral artery - cerebrovascular accident. 2. Immune thrombocytopenia. 3. Positive lupus anticoagulant.  CURRENT THERAPY: 1. Coumadin 5 mg p.o. daily to keep INR between 2-3. 2. Nplate as indicated for platelet count less than 100.  INTERVAL HISTORY:  Mark [name]  comes in for followup.  He is making a slow but steady progress with his rehab.  He still has some left-sided residual weakness.  This is more so in his left arm.  He is walking better.  He is actually going to be doing some work at home.  He is doing physical therapy.  He is going to try to do some physical therapy at home.  He had some great new in which he does not need to have right carotid endarterectomy.  He went out to [facility].  A carotid Doppler did not show any further stenosis.  This is truly a miracle.  He has not had any bleeding.  He has had no problems with fevers, sweats or chills.  His daughter is up for another Company secretary.  This would be a big night for the family.  He last got Nplate I think probably back in I think October.  We are liberalizing his Nplate as he does not need it unless his platelet count is below 100.  He has had no problems with rashes.  There has been no change in bowel or bladder habits.  PHYSICAL EXAMINATION:  General:  This is a well-developed, well- nourished white gentleman in no obvious distress.  Vital signs:  97.5, pulse 78, respiratory rate 18, blood pressure 90/71.  Weight is 192. Head and neck:  Shows a normocephalic, atraumatic skull.  There are no ocular or oral lesions.  There are no palpable cervical or supraclavicular lymph nodes.  Lungs:  Are clear bilaterally.  Cardiac: Regular rate and rhythm with normal S1, S2.  There are no murmurs, rubs or bruits.  Abdomen:  Soft with good bowel sounds.  There is no palpable abdominal mass.  There is no fluid wave.  No palpable hepatosplenomegaly.  Extremities:  Show no clubbing, cyanosis or  edema. He does have decreased range of motion of the left arm.  His pulses are good in his distal extremities.  Neurological:  Exam does show the residual left-sided weakness.  Maybe also some slight left-sided facial weakness.  LABORATORY STUDIES:  White cell count is 8.2, hemoglobin 14.3, hematocrit 42.7, platelet count 131.  INR is 3.1.  IMPRESSION:  Mark [name] is a 66 year old gentleman with ITP (idiopathic thrombocytopenic purpura).  This really has not been a problem for him. Unfortunately the problem has been this cerebrovascular accident. Whether or not this related to lupus anticoagulant is unclear.  He did have the carotid artery stenosis on the right side which apparently has resolved itself spontaneously.  Again, this is truly a miracle.  We will continue to have his blood checked weekly.  We will be getting his pro-times weekly also.  I will see Mark [name] back myself in about a month.    ______________________________ Josph Macho, M.D. PRE/MEDQ  D:  05/07/2011  T:  05/07/2011  Job:  [id number]

## 2011-05-08 ENCOUNTER — Ambulatory Visit: Payer: Medicare Other | Admitting: *Deleted

## 2011-05-08 ENCOUNTER — Ambulatory Visit: Payer: Medicare Other | Admitting: Physical Therapy

## 2011-05-09 ENCOUNTER — Ambulatory Visit: Payer: Medicare Other | Admitting: Occupational Therapy

## 2011-05-09 ENCOUNTER — Ambulatory Visit: Payer: Medicare Other | Admitting: Physical Therapy

## 2011-05-13 ENCOUNTER — Ambulatory Visit: Payer: Medicare Other | Admitting: Physical Therapy

## 2011-05-13 ENCOUNTER — Telehealth: Payer: Self-pay | Admitting: Hematology & Oncology

## 2011-05-13 ENCOUNTER — Ambulatory Visit: Payer: Medicare Other | Admitting: Occupational Therapy

## 2011-05-13 NOTE — Telephone Encounter (Signed)
Pt moved 1-29 to 1-30

## 2011-05-14 ENCOUNTER — Ambulatory Visit: Payer: Medicare Other

## 2011-05-14 ENCOUNTER — Other Ambulatory Visit: Payer: Medicare Other | Admitting: Lab

## 2011-05-15 ENCOUNTER — Ambulatory Visit: Payer: Medicare Other

## 2011-05-15 ENCOUNTER — Ambulatory Visit: Payer: Medicare Other | Admitting: Occupational Therapy

## 2011-05-15 ENCOUNTER — Ambulatory Visit: Payer: Medicare Other | Admitting: Physical Therapy

## 2011-05-15 ENCOUNTER — Ambulatory Visit (HOSPITAL_BASED_OUTPATIENT_CLINIC_OR_DEPARTMENT_OTHER): Payer: Medicare Other | Admitting: Lab

## 2011-05-15 DIAGNOSIS — I82409 Acute embolism and thrombosis of unspecified deep veins of unspecified lower extremity: Secondary | ICD-10-CM

## 2011-05-15 DIAGNOSIS — D6859 Other primary thrombophilia: Secondary | ICD-10-CM

## 2011-05-15 DIAGNOSIS — I739 Peripheral vascular disease, unspecified: Secondary | ICD-10-CM

## 2011-05-15 DIAGNOSIS — D693 Immune thrombocytopenic purpura: Secondary | ICD-10-CM

## 2011-05-15 DIAGNOSIS — I748 Embolism and thrombosis of other arteries: Secondary | ICD-10-CM

## 2011-05-15 DIAGNOSIS — D6862 Lupus anticoagulant syndrome: Secondary | ICD-10-CM

## 2011-05-15 DIAGNOSIS — I639 Cerebral infarction, unspecified: Secondary | ICD-10-CM

## 2011-05-15 LAB — CBC WITH DIFFERENTIAL (CANCER CENTER ONLY)
BASO%: 0.6 % (ref 0.0–2.0)
Eosinophils Absolute: 0.3 10*3/uL (ref 0.0–0.5)
LYMPH%: 27.4 % (ref 14.0–48.0)
MCV: 87 fL (ref 82–98)
MONO#: 1.2 10*3/uL — ABNORMAL HIGH (ref 0.1–0.9)
NEUT#: 4.2 10*3/uL (ref 1.5–6.5)
Platelets: 137 10*3/uL — ABNORMAL LOW (ref 145–400)
RBC: 4.66 10*6/uL (ref 4.20–5.70)
RDW: 14 % (ref 11.1–15.7)
WBC: 7.9 10*3/uL (ref 4.0–10.0)

## 2011-05-15 LAB — PROTHROMBIN TIME
INR: 5.91 (ref <1.50)
Prothrombin Time: 54.5 seconds — ABNORMAL HIGH (ref 11.6–15.2)

## 2011-05-15 LAB — PROTIME-INR (CHCC SATELLITE)

## 2011-05-15 NOTE — Patient Instructions (Signed)
Spoke to pt regarding INR of 5.9 from today. He denies any bleeding. Verbalized understanding to decrease Coumadin from 5 mg to 2.5 mg daily. Has an appt to recheck on 05/20/10. Bleeding precautions were reviewed and verbalized understanding of the following: SEEK IMMEDIATE MEDICAL CARE IF:   You develop bleeding from the nose, mouth, or gums.   You vomit or cough up blood.   Your bowel movements are bloody or black in color.   You have a severe headache that does not go away.   You skin bruises easily.

## 2011-05-16 ENCOUNTER — Ambulatory Visit: Payer: Medicare Other | Admitting: Occupational Therapy

## 2011-05-20 ENCOUNTER — Ambulatory Visit: Payer: Medicare Other | Attending: Family Medicine | Admitting: Physical Therapy

## 2011-05-20 DIAGNOSIS — I69998 Other sequelae following unspecified cerebrovascular disease: Secondary | ICD-10-CM | POA: Insufficient documentation

## 2011-05-20 DIAGNOSIS — I69919 Unspecified symptoms and signs involving cognitive functions following unspecified cerebrovascular disease: Secondary | ICD-10-CM | POA: Insufficient documentation

## 2011-05-20 DIAGNOSIS — R279 Unspecified lack of coordination: Secondary | ICD-10-CM | POA: Insufficient documentation

## 2011-05-20 DIAGNOSIS — Z5189 Encounter for other specified aftercare: Secondary | ICD-10-CM | POA: Insufficient documentation

## 2011-05-20 DIAGNOSIS — R5381 Other malaise: Secondary | ICD-10-CM | POA: Insufficient documentation

## 2011-05-20 DIAGNOSIS — R269 Unspecified abnormalities of gait and mobility: Secondary | ICD-10-CM | POA: Insufficient documentation

## 2011-05-20 DIAGNOSIS — I69959 Hemiplegia and hemiparesis following unspecified cerebrovascular disease affecting unspecified side: Secondary | ICD-10-CM | POA: Insufficient documentation

## 2011-05-20 DIAGNOSIS — M6281 Muscle weakness (generalized): Secondary | ICD-10-CM | POA: Insufficient documentation

## 2011-05-21 ENCOUNTER — Other Ambulatory Visit (HOSPITAL_BASED_OUTPATIENT_CLINIC_OR_DEPARTMENT_OTHER): Payer: Medicare Other | Admitting: Lab

## 2011-05-21 ENCOUNTER — Ambulatory Visit: Payer: Medicare Other

## 2011-05-21 ENCOUNTER — Other Ambulatory Visit: Payer: Self-pay | Admitting: *Deleted

## 2011-05-21 DIAGNOSIS — I639 Cerebral infarction, unspecified: Secondary | ICD-10-CM

## 2011-05-21 DIAGNOSIS — I6350 Cerebral infarction due to unspecified occlusion or stenosis of unspecified cerebral artery: Secondary | ICD-10-CM

## 2011-05-21 LAB — CBC WITH DIFFERENTIAL (CANCER CENTER ONLY)
BASO%: 0.2 % (ref 0.0–2.0)
EOS%: 4.4 % (ref 0.0–7.0)
HCT: 43.2 % (ref 38.7–49.9)
LYMPH#: 2.8 10*3/uL (ref 0.9–3.3)
MCHC: 34.5 g/dL (ref 32.0–35.9)
MONO#: 2.2 10*3/uL — ABNORMAL HIGH (ref 0.1–0.9)
NEUT#: 9.3 10*3/uL — ABNORMAL HIGH (ref 1.5–6.5)
Platelets: 188 10*3/uL (ref 145–400)
RDW: 14.3 % (ref 11.1–15.7)
WBC: 14.9 10*3/uL — ABNORMAL HIGH (ref 4.0–10.0)

## 2011-05-21 LAB — PROTIME-INR (CHCC SATELLITE)
INR: 4.8 — ABNORMAL HIGH (ref 2.0–3.5)
Protime: 57.6 Seconds — ABNORMAL HIGH (ref 10.6–13.4)

## 2011-05-22 ENCOUNTER — Ambulatory Visit: Payer: Medicare Other | Admitting: Physical Therapy

## 2011-05-22 ENCOUNTER — Encounter: Payer: Medicare Other | Admitting: Occupational Therapy

## 2011-05-24 ENCOUNTER — Ambulatory Visit: Payer: Medicare Other | Admitting: Occupational Therapy

## 2011-05-27 ENCOUNTER — Ambulatory Visit: Payer: Medicare Other | Admitting: Occupational Therapy

## 2011-05-28 ENCOUNTER — Telehealth: Payer: Self-pay | Admitting: Hematology & Oncology

## 2011-05-28 ENCOUNTER — Ambulatory Visit (HOSPITAL_BASED_OUTPATIENT_CLINIC_OR_DEPARTMENT_OTHER): Payer: Medicare Other | Admitting: Lab

## 2011-05-28 ENCOUNTER — Encounter: Payer: Self-pay | Admitting: *Deleted

## 2011-05-28 ENCOUNTER — Ambulatory Visit: Payer: Medicare Other

## 2011-05-28 DIAGNOSIS — I639 Cerebral infarction, unspecified: Secondary | ICD-10-CM

## 2011-05-28 DIAGNOSIS — I6350 Cerebral infarction due to unspecified occlusion or stenosis of unspecified cerebral artery: Secondary | ICD-10-CM

## 2011-05-28 LAB — CBC WITH DIFFERENTIAL (CANCER CENTER ONLY)
BASO%: 0.4 % (ref 0.0–2.0)
LYMPH%: 29.1 % (ref 14.0–48.0)
MCH: 30 pg (ref 28.0–33.4)
MCV: 87 fL (ref 82–98)
MONO%: 19.1 % — ABNORMAL HIGH (ref 0.0–13.0)
NEUT#: 4.5 10*3/uL (ref 1.5–6.5)
Platelets: 263 10*3/uL (ref 145–400)
RBC: 4.9 10*6/uL (ref 4.20–5.70)
RDW: 14.3 % (ref 11.1–15.7)
WBC: 9.4 10*3/uL (ref 4.0–10.0)

## 2011-05-28 LAB — PROTIME-INR (CHCC SATELLITE)
INR: 2.1 (ref 2.0–3.5)
Protime: 25.2 Seconds — ABNORMAL HIGH (ref 10.6–13.4)

## 2011-05-28 NOTE — Progress Notes (Signed)
Per Dr. Myna Hidalgo, pt called and told his Plt count was 263 and he does not need N-Plate today.  Also his INR was 2.1 and he is to stay on his current Coumadin dose of 2.5mg .  Pt voiced understanding.

## 2011-05-28 NOTE — Patient Instructions (Signed)
Stevenson Clinch, RN called pt & gave him the instructions with verbalized understanding.

## 2011-05-28 NOTE — Progress Notes (Signed)
Pt here today for N-Plate.  Plt count adequate per Dr. Myna Hidalgo.  No N-Plate needed.  Pt aware.

## 2011-05-28 NOTE — Telephone Encounter (Signed)
Pt completed a release of information.  Wanted records faxed to Dr. Marcheta Grammes @ [facility].  All records faxed.

## 2011-05-30 ENCOUNTER — Ambulatory Visit: Payer: Medicare Other | Admitting: Occupational Therapy

## 2011-06-04 ENCOUNTER — Ambulatory Visit: Payer: Medicare Other | Admitting: Occupational Therapy

## 2011-06-05 ENCOUNTER — Other Ambulatory Visit (HOSPITAL_BASED_OUTPATIENT_CLINIC_OR_DEPARTMENT_OTHER): Payer: Medicare Other | Admitting: Lab

## 2011-06-05 ENCOUNTER — Ambulatory Visit: Payer: Medicare Other

## 2011-06-05 ENCOUNTER — Ambulatory Visit (HOSPITAL_BASED_OUTPATIENT_CLINIC_OR_DEPARTMENT_OTHER): Payer: Medicare Other | Admitting: Hematology & Oncology

## 2011-06-05 DIAGNOSIS — I6350 Cerebral infarction due to unspecified occlusion or stenosis of unspecified cerebral artery: Secondary | ICD-10-CM

## 2011-06-05 DIAGNOSIS — D6862 Lupus anticoagulant syndrome: Secondary | ICD-10-CM

## 2011-06-05 DIAGNOSIS — I639 Cerebral infarction, unspecified: Secondary | ICD-10-CM

## 2011-06-05 DIAGNOSIS — D693 Immune thrombocytopenic purpura: Secondary | ICD-10-CM

## 2011-06-05 DIAGNOSIS — R894 Abnormal immunological findings in specimens from other organs, systems and tissues: Secondary | ICD-10-CM

## 2011-06-05 DIAGNOSIS — D68312 Antiphospholipid antibody with hemorrhagic disorder: Secondary | ICD-10-CM

## 2011-06-05 LAB — CBC WITH DIFFERENTIAL (CANCER CENTER ONLY)
BASO%: 0.5 % (ref 0.0–2.0)
EOS%: 2.8 % (ref 0.0–7.0)
LYMPH%: 33.5 % (ref 14.0–48.0)
MCH: 30.1 pg (ref 28.0–33.4)
MCHC: 34.3 g/dL (ref 32.0–35.9)
MCV: 88 fL (ref 82–98)
MONO%: 15.2 % — ABNORMAL HIGH (ref 0.0–13.0)
NEUT#: 4.8 10*3/uL (ref 1.5–6.5)
Platelets: 161 10*3/uL (ref 145–400)
RDW: 14.4 % (ref 11.1–15.7)

## 2011-06-05 LAB — PROTIME-INR (CHCC SATELLITE)
INR: 2.7 (ref 2.0–3.5)
Protime: 32.4 Seconds — ABNORMAL HIGH (ref 10.6–13.4)

## 2011-06-05 NOTE — Progress Notes (Signed)
This office note has been dictated.

## 2011-06-06 ENCOUNTER — Ambulatory Visit: Payer: Medicare Other | Admitting: Occupational Therapy

## 2011-06-06 NOTE — Progress Notes (Signed)
DIAGNOSES: 1. Right middle cerebral artery cerebrovascular accident. 2. Immune thrombocytopenia-refractory. 3. Positive lupus anticoagulant.  CURRENT THERAPY: 1. Coumadin 2.5 mg p.o. daily to keep her INR between 2-3     intermittent. 2. Nplate as indicated for platelet count less than 100.  INTERVAL HISTORY:  Mr. Kanouse comes in for followup.  He is still doing some therapy. He is in occupation therapy now.  Otherwise, he is making progress.  He still has decreased strength in the left arm.  This is what he is working on right now.  He has had no problems with his legs.  He is walking pretty well.  There is no palpable bowel or bladder incontinence.  He has had no bleeding. He has had no issues pain-wise.  He has had no cough.  She had no interval double vision or blurred vision.  PHYSICAL EXAM:  General: This is a well-developed, well-nourished, white gentleman in no obvious distress.  Vital signs: 97.3, pulse 63, respiratory rate 18, blood pressure 104/63, and weight is 189.  Head and neck exam shows a normocephalic, atraumatic skull.  There are no ocular or oral lesions.  He has no palpable cervical or supraclavicular lymph nodes.  Lungs are clear to percussion and auscultation bilaterally. Cardiac exam: Regular rate and rhythm with a normal S1 and S2.  There are no murmurs, rubs, or bruits.  Abdominal exam: Soft with good bowel sounds.  There is no palpable abdominal mass.  There is no palpable hepatosplenomegaly.  He has a splenectomy scar that is well-healed. Back exam: No tenderness over the spine, ribs, or hips.  Extremities: Shows no clubbing, cyanosis, or edema.  He does have the decreased in strength in the left arm. Neurological exam: Shows the left arm weakness.  He has better strength in his left leg.  LABORATORY STUDIES:  White cell count is 9.9, hemoglobin 14.9, hematocrit 43.4, and platelet count 161.  INR is 2.7.  IMPRESSION:  Mr. Erdahl this is a  66 year old gentleman with 2 separate hematologic issues.  He has a new thrombocytopenia.  He has already undergone splenectomy.  He is on Nplate.  He does not require any Nplate today.  I will give him Nplate only if his platelet count is below 100.  He also has lupus anticoagulant.  He incurred a cerebrovascular accident of right middle cerebral artery.  He developed profound left-sided issues, but he is slowly improving from this.  For now, we will go ahead and have him followed weekly.  He still requires fairly aggressive followup.  I will see Mr. Myrtie Soman back myself in about 6 weeks' time.  I think as long as we follow his blood count and INR weekly, we can monitor and maintain his levels at the appropriate level.    ______________________________ Josph Macho, M.D. PRE/MEDQ  D:  06/05/2011  T:  06/06/2011  Job:  [id number]

## 2011-06-10 ENCOUNTER — Ambulatory Visit: Payer: Medicare Other | Admitting: Occupational Therapy

## 2011-06-11 ENCOUNTER — Ambulatory Visit: Payer: Medicare Other

## 2011-06-11 ENCOUNTER — Other Ambulatory Visit (HOSPITAL_BASED_OUTPATIENT_CLINIC_OR_DEPARTMENT_OTHER): Payer: Medicare Other | Admitting: Lab

## 2011-06-11 DIAGNOSIS — D693 Immune thrombocytopenic purpura: Secondary | ICD-10-CM

## 2011-06-11 DIAGNOSIS — D6859 Other primary thrombophilia: Secondary | ICD-10-CM

## 2011-06-11 DIAGNOSIS — I6350 Cerebral infarction due to unspecified occlusion or stenosis of unspecified cerebral artery: Secondary | ICD-10-CM

## 2011-06-11 DIAGNOSIS — I639 Cerebral infarction, unspecified: Secondary | ICD-10-CM

## 2011-06-11 DIAGNOSIS — D6862 Lupus anticoagulant syndrome: Secondary | ICD-10-CM

## 2011-06-11 LAB — PROTIME-INR (CHCC SATELLITE)
INR: 2.7 (ref 2.0–3.5)
Protime: 32.4 Seconds — ABNORMAL HIGH (ref 10.6–13.4)

## 2011-06-11 LAB — CBC WITH DIFFERENTIAL (CANCER CENTER ONLY)
BASO%: 0.2 % (ref 0.0–2.0)
EOS%: 3.4 % (ref 0.0–7.0)
LYMPH#: 2.8 10*3/uL (ref 0.9–3.3)
MCHC: 34.6 g/dL (ref 32.0–35.9)
NEUT#: 4 10*3/uL (ref 1.5–6.5)
RDW: 14.7 % (ref 11.1–15.7)

## 2011-06-13 ENCOUNTER — Ambulatory Visit: Payer: Medicare Other | Admitting: Occupational Therapy

## 2011-06-17 ENCOUNTER — Ambulatory Visit: Payer: Medicare Other | Attending: Family Medicine | Admitting: Occupational Therapy

## 2011-06-17 DIAGNOSIS — Z5189 Encounter for other specified aftercare: Secondary | ICD-10-CM | POA: Insufficient documentation

## 2011-06-17 DIAGNOSIS — I69959 Hemiplegia and hemiparesis following unspecified cerebrovascular disease affecting unspecified side: Secondary | ICD-10-CM | POA: Insufficient documentation

## 2011-06-17 DIAGNOSIS — F4321 Adjustment disorder with depressed mood: Secondary | ICD-10-CM

## 2011-06-17 DIAGNOSIS — I69998 Other sequelae following unspecified cerebrovascular disease: Secondary | ICD-10-CM | POA: Insufficient documentation

## 2011-06-17 DIAGNOSIS — M6281 Muscle weakness (generalized): Secondary | ICD-10-CM | POA: Insufficient documentation

## 2011-06-17 DIAGNOSIS — R269 Unspecified abnormalities of gait and mobility: Secondary | ICD-10-CM | POA: Insufficient documentation

## 2011-06-17 DIAGNOSIS — R279 Unspecified lack of coordination: Secondary | ICD-10-CM | POA: Insufficient documentation

## 2011-06-17 DIAGNOSIS — I69919 Unspecified symptoms and signs involving cognitive functions following unspecified cerebrovascular disease: Secondary | ICD-10-CM | POA: Insufficient documentation

## 2011-06-17 DIAGNOSIS — R5381 Other malaise: Secondary | ICD-10-CM | POA: Insufficient documentation

## 2011-06-18 ENCOUNTER — Ambulatory Visit: Payer: Medicare Other

## 2011-06-18 ENCOUNTER — Other Ambulatory Visit: Payer: Medicare Other | Admitting: Lab

## 2011-06-18 NOTE — Progress Notes (Signed)
Patient did not show up for injection appointment. Mark [name], [name] [employer]

## 2011-06-19 ENCOUNTER — Ambulatory Visit: Payer: Medicare Other | Admitting: Occupational Therapy

## 2011-06-20 ENCOUNTER — Other Ambulatory Visit: Payer: Medicare Other | Admitting: Lab

## 2011-06-20 ENCOUNTER — Ambulatory Visit: Payer: Medicare Other

## 2011-06-20 NOTE — Progress Notes (Signed)
No show for today's appointment. Mark [name], Mark [name] [employer]

## 2011-06-21 ENCOUNTER — Ambulatory Visit: Payer: Medicare Other | Admitting: Occupational Therapy

## 2011-06-21 DIAGNOSIS — F4321 Adjustment disorder with depressed mood: Secondary | ICD-10-CM

## 2011-06-24 ENCOUNTER — Ambulatory Visit: Payer: Medicare Other | Admitting: Occupational Therapy

## 2011-06-25 ENCOUNTER — Ambulatory Visit: Payer: Medicare Other

## 2011-06-25 ENCOUNTER — Other Ambulatory Visit (HOSPITAL_BASED_OUTPATIENT_CLINIC_OR_DEPARTMENT_OTHER): Payer: Medicare Other | Admitting: Lab

## 2011-06-25 DIAGNOSIS — D68312 Antiphospholipid antibody with hemorrhagic disorder: Secondary | ICD-10-CM

## 2011-06-25 DIAGNOSIS — I639 Cerebral infarction, unspecified: Secondary | ICD-10-CM

## 2011-06-25 DIAGNOSIS — D693 Immune thrombocytopenic purpura: Secondary | ICD-10-CM

## 2011-06-25 LAB — CBC WITH DIFFERENTIAL (CANCER CENTER ONLY)
BASO%: 0.5 % (ref 0.0–2.0)
EOS%: 4.6 % (ref 0.0–7.0)
LYMPH#: 3.5 10*3/uL — ABNORMAL HIGH (ref 0.9–3.3)
MCHC: 33.6 g/dL (ref 32.0–35.9)
NEUT#: 4.2 10*3/uL (ref 1.5–6.5)
NEUT%: 43.1 % (ref 40.0–80.0)
Platelets: 157 10*3/uL (ref 145–400)
RDW: 15 % (ref 11.1–15.7)

## 2011-06-25 LAB — PROTIME-INR (CHCC SATELLITE): INR: 2.5 (ref 2.0–3.5)

## 2011-06-25 NOTE — Progress Notes (Signed)
[name], Mark [name] did not required any injection today labs were good.

## 2011-06-26 ENCOUNTER — Ambulatory Visit: Payer: Medicare Other | Admitting: Occupational Therapy

## 2011-06-28 ENCOUNTER — Ambulatory Visit: Payer: Medicare Other | Admitting: Occupational Therapy

## 2011-07-01 ENCOUNTER — Ambulatory Visit: Payer: Medicare Other | Admitting: Occupational Therapy

## 2011-07-02 ENCOUNTER — Other Ambulatory Visit (HOSPITAL_BASED_OUTPATIENT_CLINIC_OR_DEPARTMENT_OTHER): Payer: Medicare Other | Admitting: Lab

## 2011-07-02 ENCOUNTER — Ambulatory Visit: Payer: Medicare Other

## 2011-07-02 DIAGNOSIS — I639 Cerebral infarction, unspecified: Secondary | ICD-10-CM

## 2011-07-02 DIAGNOSIS — D6862 Lupus anticoagulant syndrome: Secondary | ICD-10-CM

## 2011-07-02 DIAGNOSIS — D6859 Other primary thrombophilia: Secondary | ICD-10-CM

## 2011-07-02 DIAGNOSIS — D693 Immune thrombocytopenic purpura: Secondary | ICD-10-CM

## 2011-07-02 DIAGNOSIS — I6350 Cerebral infarction due to unspecified occlusion or stenosis of unspecified cerebral artery: Secondary | ICD-10-CM

## 2011-07-02 LAB — PROTIME-INR (CHCC SATELLITE): INR: 2.6 (ref 2.0–3.5)

## 2011-07-02 LAB — CBC WITH DIFFERENTIAL (CANCER CENTER ONLY)
BASO#: 0 10*3/uL (ref 0.0–0.2)
EOS%: 7.8 % — ABNORMAL HIGH (ref 0.0–7.0)
HGB: 13.8 g/dL (ref 13.0–17.1)
LYMPH%: 37.1 % (ref 14.0–48.0)
MCH: 30.1 pg (ref 28.0–33.4)
MCHC: 34.1 g/dL (ref 32.0–35.9)
MCV: 88 fL (ref 82–98)
MONO%: 19 % — ABNORMAL HIGH (ref 0.0–13.0)
NEUT#: 2.5 10*3/uL (ref 1.5–6.5)
Platelets: 166 10*3/uL (ref 145–400)

## 2011-07-02 NOTE — Progress Notes (Signed)
Pt's Plt's today were 166.  Per Dr. Myna Hidalgo, no N-Plate  needed

## 2011-07-03 ENCOUNTER — Ambulatory Visit: Payer: Medicare Other | Admitting: Occupational Therapy

## 2011-07-05 ENCOUNTER — Ambulatory Visit: Payer: Medicare Other | Admitting: Occupational Therapy

## 2011-07-05 ENCOUNTER — Telehealth: Payer: Self-pay | Admitting: *Deleted

## 2011-07-05 DIAGNOSIS — F4321 Adjustment disorder with depressed mood: Secondary | ICD-10-CM

## 2011-07-05 NOTE — Telephone Encounter (Signed)
Message copied by Mirian Capuchin on Fri Jul 05, 2011  5:33 PM ------      Message from: Arlan Organ R      Created: Tue Jul 02, 2011  6:20 PM       Call- plt and INR are great!!!!  No change w/ coumadin.

## 2011-07-05 NOTE — Telephone Encounter (Signed)
Pt called and message left Dr. Gustavo Lah message  on pt's home answering machine

## 2011-07-08 ENCOUNTER — Ambulatory Visit: Payer: Medicare Other | Admitting: Occupational Therapy

## 2011-07-09 ENCOUNTER — Ambulatory Visit: Payer: Medicare Other

## 2011-07-09 ENCOUNTER — Other Ambulatory Visit: Payer: Medicare Other | Admitting: Lab

## 2011-07-09 ENCOUNTER — Telehealth: Payer: Self-pay | Admitting: Hematology & Oncology

## 2011-07-09 NOTE — Telephone Encounter (Signed)
Pt's wife called and cx 07/09/11 apt and resch for 07/11/11

## 2011-07-09 NOTE — Telephone Encounter (Signed)
Pt moved 3-36 to 3-28

## 2011-07-10 ENCOUNTER — Ambulatory Visit: Payer: Medicare Other | Admitting: Occupational Therapy

## 2011-07-10 ENCOUNTER — Ambulatory Visit: Payer: Medicare Other

## 2011-07-10 ENCOUNTER — Other Ambulatory Visit (HOSPITAL_BASED_OUTPATIENT_CLINIC_OR_DEPARTMENT_OTHER): Payer: Medicare Other | Admitting: Lab

## 2011-07-10 DIAGNOSIS — I639 Cerebral infarction, unspecified: Secondary | ICD-10-CM

## 2011-07-10 DIAGNOSIS — D68312 Antiphospholipid antibody with hemorrhagic disorder: Secondary | ICD-10-CM

## 2011-07-10 DIAGNOSIS — D693 Immune thrombocytopenic purpura: Secondary | ICD-10-CM

## 2011-07-10 DIAGNOSIS — I6350 Cerebral infarction due to unspecified occlusion or stenosis of unspecified cerebral artery: Secondary | ICD-10-CM

## 2011-07-10 LAB — CBC WITH DIFFERENTIAL (CANCER CENTER ONLY)
BASO#: 0 10*3/uL (ref 0.0–0.2)
EOS%: 2.7 % (ref 0.0–7.0)
Eosinophils Absolute: 0.3 10*3/uL (ref 0.0–0.5)
HGB: 14.4 g/dL (ref 13.0–17.1)
LYMPH%: 30.6 % (ref 14.0–48.0)
MCH: 30.1 pg (ref 28.0–33.4)
MCHC: 33.9 g/dL (ref 32.0–35.9)
MCV: 89 fL (ref 82–98)
MONO%: 16.3 % — ABNORMAL HIGH (ref 0.0–13.0)
NEUT#: 4.6 10*3/uL (ref 1.5–6.5)
NEUT%: 50 % (ref 40.0–80.0)
RBC: 4.79 10*6/uL (ref 4.20–5.70)

## 2011-07-10 LAB — PROTIME-INR (CHCC SATELLITE): INR: 3.2 (ref 2.0–3.5)

## 2011-07-11 ENCOUNTER — Ambulatory Visit: Payer: Medicare Other | Admitting: Occupational Therapy

## 2011-07-11 ENCOUNTER — Other Ambulatory Visit: Payer: Medicare Other | Admitting: Lab

## 2011-07-11 ENCOUNTER — Ambulatory Visit: Payer: Medicare Other

## 2011-07-15 ENCOUNTER — Telehealth: Payer: Self-pay | Admitting: *Deleted

## 2011-07-15 NOTE — Telephone Encounter (Addendum)
Message copied by Mirian Capuchin on Mon Jul 15, 2011  3:50 PM ------      Message from: Arlan Organ R      Created: Thu Jul 11, 2011  9:04 PM       Call: no change in coumadin.  Cindee Lame  This message left on pt's home phone answering machine

## 2011-07-16 ENCOUNTER — Ambulatory Visit: Payer: Medicare Other | Attending: Family Medicine | Admitting: Occupational Therapy

## 2011-07-16 ENCOUNTER — Ambulatory Visit (HOSPITAL_BASED_OUTPATIENT_CLINIC_OR_DEPARTMENT_OTHER): Payer: Medicare Other | Admitting: Lab

## 2011-07-16 ENCOUNTER — Other Ambulatory Visit: Payer: Self-pay | Admitting: *Deleted

## 2011-07-16 ENCOUNTER — Ambulatory Visit: Payer: Medicare Other

## 2011-07-16 DIAGNOSIS — R5381 Other malaise: Secondary | ICD-10-CM | POA: Insufficient documentation

## 2011-07-16 DIAGNOSIS — I69998 Other sequelae following unspecified cerebrovascular disease: Secondary | ICD-10-CM | POA: Insufficient documentation

## 2011-07-16 DIAGNOSIS — I69919 Unspecified symptoms and signs involving cognitive functions following unspecified cerebrovascular disease: Secondary | ICD-10-CM | POA: Insufficient documentation

## 2011-07-16 DIAGNOSIS — Z5189 Encounter for other specified aftercare: Secondary | ICD-10-CM | POA: Insufficient documentation

## 2011-07-16 DIAGNOSIS — R279 Unspecified lack of coordination: Secondary | ICD-10-CM | POA: Insufficient documentation

## 2011-07-16 DIAGNOSIS — R269 Unspecified abnormalities of gait and mobility: Secondary | ICD-10-CM | POA: Insufficient documentation

## 2011-07-16 DIAGNOSIS — I6350 Cerebral infarction due to unspecified occlusion or stenosis of unspecified cerebral artery: Secondary | ICD-10-CM

## 2011-07-16 DIAGNOSIS — I69959 Hemiplegia and hemiparesis following unspecified cerebrovascular disease affecting unspecified side: Secondary | ICD-10-CM | POA: Insufficient documentation

## 2011-07-16 DIAGNOSIS — M6281 Muscle weakness (generalized): Secondary | ICD-10-CM | POA: Insufficient documentation

## 2011-07-16 LAB — PROTIME-INR (CHCC SATELLITE): INR: 3 (ref 2.0–3.5)

## 2011-07-16 LAB — CBC WITH DIFFERENTIAL (CANCER CENTER ONLY)
BASO#: 0 10*3/uL (ref 0.0–0.2)
EOS%: 3.8 % (ref 0.0–7.0)
Eosinophils Absolute: 0.4 10*3/uL (ref 0.0–0.5)
HCT: 42 % (ref 38.7–49.9)
HGB: 14.3 g/dL (ref 13.0–17.1)
LYMPH#: 3.2 10*3/uL (ref 0.9–3.3)
MCHC: 34 g/dL (ref 32.0–35.9)
MONO#: 1.5 10*3/uL — ABNORMAL HIGH (ref 0.1–0.9)
NEUT#: 4.4 10*3/uL (ref 1.5–6.5)
NEUT%: 46.4 % (ref 40.0–80.0)
RBC: 4.71 10*6/uL (ref 4.20–5.70)
WBC: 9.4 10*3/uL (ref 4.0–10.0)

## 2011-07-16 NOTE — Patient Instructions (Signed)
Left message on voicemail stating that his INR was 3.0 and should continue with the same dose of Coumadin. Will also have weekly appts changed to lab only as his platelets have been stable and he hasn't needed a n-plate injection in quite some time per Dr Myna Hidalgo. To call back if questions and/or concerns.

## 2011-07-17 ENCOUNTER — Telehealth: Payer: Self-pay | Admitting: Hematology & Oncology

## 2011-07-17 NOTE — Telephone Encounter (Signed)
Per MD canceled all injections

## 2011-07-18 ENCOUNTER — Encounter: Payer: Medicare Other | Admitting: Occupational Therapy

## 2011-07-19 ENCOUNTER — Ambulatory Visit: Payer: Medicare Other | Admitting: Occupational Therapy

## 2011-07-23 ENCOUNTER — Ambulatory Visit: Payer: Medicare Other | Admitting: Occupational Therapy

## 2011-07-24 ENCOUNTER — Other Ambulatory Visit (HOSPITAL_BASED_OUTPATIENT_CLINIC_OR_DEPARTMENT_OTHER): Payer: Medicare Other | Admitting: Lab

## 2011-07-24 ENCOUNTER — Ambulatory Visit: Payer: Medicare Other | Admitting: Hematology & Oncology

## 2011-07-24 ENCOUNTER — Ambulatory Visit: Payer: Medicare Other

## 2011-07-24 ENCOUNTER — Telehealth: Payer: Self-pay | Admitting: *Deleted

## 2011-07-24 NOTE — Telephone Encounter (Signed)
Left message on pt's voicemail checking to see if he was doing ok as he was due to see Dr Myna Hidalgo this morning for f/u. Asked that he call back to let us know how he is doing.

## 2011-07-25 ENCOUNTER — Ambulatory Visit: Payer: Medicare Other | Admitting: Occupational Therapy

## 2011-07-26 ENCOUNTER — Ambulatory Visit: Payer: Medicare Other | Admitting: Occupational Therapy

## 2011-07-26 ENCOUNTER — Telehealth: Payer: Self-pay | Admitting: Hematology & Oncology

## 2011-07-26 NOTE — Telephone Encounter (Signed)
Pt missed 4-10 appointment moved lab from 4-17 to 4-15 rescheduled MD to 5-24

## 2011-07-29 ENCOUNTER — Other Ambulatory Visit (HOSPITAL_BASED_OUTPATIENT_CLINIC_OR_DEPARTMENT_OTHER): Payer: Medicare Other | Admitting: Lab

## 2011-07-29 DIAGNOSIS — I639 Cerebral infarction, unspecified: Secondary | ICD-10-CM

## 2011-07-29 DIAGNOSIS — D693 Immune thrombocytopenic purpura: Secondary | ICD-10-CM

## 2011-07-29 DIAGNOSIS — D68312 Antiphospholipid antibody with hemorrhagic disorder: Secondary | ICD-10-CM

## 2011-07-29 DIAGNOSIS — I6350 Cerebral infarction due to unspecified occlusion or stenosis of unspecified cerebral artery: Secondary | ICD-10-CM

## 2011-07-29 LAB — CBC WITH DIFFERENTIAL (CANCER CENTER ONLY)
BASO%: 0.4 % (ref 0.0–2.0)
LYMPH%: 30.4 % (ref 14.0–48.0)
MCH: 30.2 pg (ref 28.0–33.4)
MCV: 89 fL (ref 82–98)
MONO%: 13.8 % — ABNORMAL HIGH (ref 0.0–13.0)
Platelets: 157 10*3/uL (ref 145–400)
RDW: 14.3 % (ref 11.1–15.7)
WBC: 7.7 10*3/uL (ref 4.0–10.0)

## 2011-07-29 LAB — PROTIME-INR (CHCC SATELLITE): Protime: 28.8 Seconds — ABNORMAL HIGH (ref 10.6–13.4)

## 2011-07-30 ENCOUNTER — Encounter: Payer: Medicare Other | Admitting: Occupational Therapy

## 2011-07-31 ENCOUNTER — Ambulatory Visit: Payer: Medicare Other | Admitting: Occupational Therapy

## 2011-07-31 ENCOUNTER — Other Ambulatory Visit: Payer: Medicare Other | Admitting: Lab

## 2011-07-31 ENCOUNTER — Ambulatory Visit: Payer: Medicare Other

## 2011-08-01 ENCOUNTER — Ambulatory Visit: Payer: Medicare Other | Admitting: Occupational Therapy

## 2011-08-06 ENCOUNTER — Ambulatory Visit: Payer: Medicare Other | Admitting: Occupational Therapy

## 2011-08-07 ENCOUNTER — Ambulatory Visit (HOSPITAL_BASED_OUTPATIENT_CLINIC_OR_DEPARTMENT_OTHER): Payer: Medicare Other | Admitting: Lab

## 2011-08-07 ENCOUNTER — Ambulatory Visit: Payer: Medicare Other

## 2011-08-07 DIAGNOSIS — D693 Immune thrombocytopenic purpura: Secondary | ICD-10-CM

## 2011-08-07 DIAGNOSIS — I6350 Cerebral infarction due to unspecified occlusion or stenosis of unspecified cerebral artery: Secondary | ICD-10-CM

## 2011-08-07 DIAGNOSIS — I639 Cerebral infarction, unspecified: Secondary | ICD-10-CM

## 2011-08-07 DIAGNOSIS — D68312 Antiphospholipid antibody with hemorrhagic disorder: Secondary | ICD-10-CM

## 2011-08-07 LAB — CBC WITH DIFFERENTIAL (CANCER CENTER ONLY)
BASO%: 0.6 % (ref 0.0–2.0)
LYMPH%: 24.1 % (ref 14.0–48.0)
MCH: 30.5 pg (ref 28.0–33.4)
MCV: 90 fL (ref 82–98)
MONO%: 15.5 % — ABNORMAL HIGH (ref 0.0–13.0)
Platelets: 129 10*3/uL — ABNORMAL LOW (ref 145–400)
RDW: 14.3 % (ref 11.1–15.7)

## 2011-08-07 LAB — PROTIME-INR (CHCC SATELLITE): Protime: 21.6 Seconds — ABNORMAL HIGH (ref 10.6–13.4)

## 2011-08-07 NOTE — Progress Notes (Signed)
Spoke to pt & his wife regarding his INR. Informed them that it was lower at 1.8. She stated that the MD at [facility] placed him on Vitamin K 100 mcg daily. The reasoning given to them was "like why you receive allergy shots". Reviewed with Dr Myna Hidalgo. To increase Coumadin to 5 mg alt with 2.5 mg. To recheck as scheduled. Both verbalized understanding.

## 2011-08-08 ENCOUNTER — Ambulatory Visit: Payer: Medicare Other | Admitting: Occupational Therapy

## 2011-08-12 ENCOUNTER — Ambulatory Visit (HOSPITAL_BASED_OUTPATIENT_CLINIC_OR_DEPARTMENT_OTHER): Payer: Medicare Other | Admitting: Hematology & Oncology

## 2011-08-12 ENCOUNTER — Other Ambulatory Visit (HOSPITAL_BASED_OUTPATIENT_CLINIC_OR_DEPARTMENT_OTHER): Payer: Medicare Other | Admitting: Lab

## 2011-08-12 DIAGNOSIS — IMO0002 Reserved for concepts with insufficient information to code with codable children: Secondary | ICD-10-CM

## 2011-08-12 DIAGNOSIS — D68312 Antiphospholipid antibody with hemorrhagic disorder: Secondary | ICD-10-CM

## 2011-08-12 DIAGNOSIS — I639 Cerebral infarction, unspecified: Secondary | ICD-10-CM

## 2011-08-12 DIAGNOSIS — Z7901 Long term (current) use of anticoagulants: Secondary | ICD-10-CM

## 2011-08-12 DIAGNOSIS — D693 Immune thrombocytopenic purpura: Secondary | ICD-10-CM | POA: Insufficient documentation

## 2011-08-12 DIAGNOSIS — I82409 Acute embolism and thrombosis of unspecified deep veins of unspecified lower extremity: Secondary | ICD-10-CM

## 2011-08-12 DIAGNOSIS — I6350 Cerebral infarction due to unspecified occlusion or stenosis of unspecified cerebral artery: Secondary | ICD-10-CM

## 2011-08-12 LAB — CBC WITH DIFFERENTIAL (CANCER CENTER ONLY)
Eosinophils Absolute: 0 10*3/uL (ref 0.0–0.5)
LYMPH%: 4.9 % — ABNORMAL LOW (ref 14.0–48.0)
MCH: 30.7 pg (ref 28.0–33.4)
MCV: 89 fL (ref 82–98)
MONO%: 8.8 % (ref 0.0–13.0)
NEUT%: 86.2 % — ABNORMAL HIGH (ref 40.0–80.0)
Platelets: 84 10*3/uL — ABNORMAL LOW (ref 145–400)
RBC: 4.43 10*6/uL (ref 4.20–5.70)
RDW: 14.6 % (ref 11.1–15.7)

## 2011-08-12 LAB — PROTIME-INR (CHCC SATELLITE)

## 2011-08-12 NOTE — Progress Notes (Signed)
DIAGNOSES: 1. Refractory immune thrombocytopenia. 2. Lupus anticoagulant with subsequent cerebrovascular accident  and     left-sided weakness.  CURRENT THERAPY: 1. Lifelong Coumadin to maintain INR between 2-3. 2. Nplate as indicated for platelet count below 100.  INTERIM HISTORY:  Ms. Mark [name] comes in for a visit.  This is an unscheduled visit.  He saw Dr. Marcheta Grammes at [facility].  He had a nice visit with her.  I talked to her today, who felt that he was on a good program for therapy.  She did recommend Rituxan.  This seems reasonable to me.  She also recommended the triple vaccine.  We are going to have to see about this when he comes in for his Rituxan.  She also recommended that he take vitamin K (0.1 mg) along with the Coumadin.  He has some bruising and petechiae over the weekend.  When this happens, it is almost clearly a sign of his platelet count being very low.  This is very typical for [name].  His platelet count on the 24th was 129.  It is not surprising that 2 days later the platelet count was less than 5.  As such, I always told him to take Decadron.  Decadron works very well for him.  He did take some Decadron.  He took 40 mg Sunday, I think Saturday and Sunday.  Today, his platelet count is 84,000.  His INR was 3.9.  He is on 5 mg of Coumadin alternating with 2.5 mg of Coumadin.  I told him to take 2.5 mg-5 mg on a schedule.  We are checking his CBC and INR weekly.  He has had no problems seizure wise.  He continues on Keppra.  He is also taking Fosamax.  He is on lisinopril and Crestor.  He is doing physical therapy which is helping with the mobility of his left arm.  He has not had any kind of fever at all.  PHYSICAL EXAMINATION:  All of his vital signs were relatively stable. His lungs were clear.  Cardiac exam regular rhythm with no murmurs, rubs or bruits.  Abdomen exam soft.  He had good bowel sounds.  There was no palpable hepatomegaly.  Well-healed  splenectomy scar.  His extremities showed weakness in his left arm.  His lower extremities are okay with strength and range of motion.  Skin exam did show some small scattered ecchymoses.  LABORATORY STUDIES:  His white cell count was 32,000 secondary to the Decadron.  We will go ahead and get him started on Rituxan.  We will give him 4 weeks of Rituxan.  He again will have his lab work done weekly.  I definitely appreciated Dr. Dianne Dun help.  Again, I called her today.  She was very intrigued about his hypercoagulability and hypocoagulability at the same time.  I will plan to see Mr. Nobbe back after his Rituxan is done.    ______________________________ Josph Macho, M.D. PRE/MEDQ  D:  08/12/2011  T:  08/12/2011  Job:  [id number]

## 2011-08-12 NOTE — Progress Notes (Signed)
This office note has been dictated.

## 2011-08-13 ENCOUNTER — Encounter: Payer: Medicare Other | Admitting: Occupational Therapy

## 2011-08-13 DIAGNOSIS — R490 Dysphonia: Secondary | ICD-10-CM | POA: Insufficient documentation

## 2011-08-14 ENCOUNTER — Ambulatory Visit (HOSPITAL_BASED_OUTPATIENT_CLINIC_OR_DEPARTMENT_OTHER): Payer: Medicare Other | Admitting: Lab

## 2011-08-14 ENCOUNTER — Ambulatory Visit: Payer: Medicare Other

## 2011-08-14 ENCOUNTER — Ambulatory Visit (HOSPITAL_BASED_OUTPATIENT_CLINIC_OR_DEPARTMENT_OTHER): Payer: Medicare Other

## 2011-08-14 ENCOUNTER — Other Ambulatory Visit: Payer: Medicare Other | Admitting: Lab

## 2011-08-14 VITALS — BP 120/72 | HR 58 | Temp 97.0°F | Wt 192.0 lb

## 2011-08-14 DIAGNOSIS — I82409 Acute embolism and thrombosis of unspecified deep veins of unspecified lower extremity: Secondary | ICD-10-CM

## 2011-08-14 DIAGNOSIS — I6350 Cerebral infarction due to unspecified occlusion or stenosis of unspecified cerebral artery: Secondary | ICD-10-CM

## 2011-08-14 DIAGNOSIS — D693 Immune thrombocytopenic purpura: Secondary | ICD-10-CM

## 2011-08-14 DIAGNOSIS — Z23 Encounter for immunization: Secondary | ICD-10-CM

## 2011-08-14 DIAGNOSIS — IMO0002 Reserved for concepts with insufficient information to code with codable children: Secondary | ICD-10-CM

## 2011-08-14 DIAGNOSIS — Z5112 Encounter for antineoplastic immunotherapy: Secondary | ICD-10-CM

## 2011-08-14 DIAGNOSIS — Z7901 Long term (current) use of anticoagulants: Secondary | ICD-10-CM

## 2011-08-14 LAB — COMPREHENSIVE METABOLIC PANEL
BUN: 24 mg/dL — ABNORMAL HIGH (ref 6–23)
CO2: 22 mEq/L (ref 19–32)
Creatinine, Ser: 1.12 mg/dL (ref 0.50–1.35)
Glucose, Bld: 239 mg/dL — ABNORMAL HIGH (ref 70–99)
Total Bilirubin: 0.5 mg/dL (ref 0.3–1.2)

## 2011-08-14 LAB — CBC WITH DIFFERENTIAL (CANCER CENTER ONLY)
BASO%: 0.2 % (ref 0.0–2.0)
EOS%: 0 % (ref 0.0–7.0)
HCT: 40.6 % (ref 38.7–49.9)
LYMPH#: 1.6 10*3/uL (ref 0.9–3.3)
LYMPH%: 6.3 % — ABNORMAL LOW (ref 14.0–48.0)
MCHC: 34 g/dL (ref 32.0–35.9)
MCV: 90 fL (ref 82–98)
NEUT%: 85.8 % — ABNORMAL HIGH (ref 40.0–80.0)
RDW: 14.5 % (ref 11.1–15.7)

## 2011-08-14 MED ORDER — MENINGOCOCCAL VAC A,C,Y,W-135 ~~LOC~~ INJ
0.5000 mL | INJECTION | Freq: Once | SUBCUTANEOUS | Status: AC
  Administered 2011-08-14: 0.5 mL via SUBCUTANEOUS
  Filled 2011-08-14: qty 0.5

## 2011-08-14 MED ORDER — SODIUM CHLORIDE 0.9 % IV SOLN
Freq: Once | INTRAVENOUS | Status: AC
  Administered 2011-08-14: 09:00:00 via INTRAVENOUS

## 2011-08-14 MED ORDER — SODIUM CHLORIDE 0.9 % IV SOLN
375.0000 mg/m2 | Freq: Once | INTRAVENOUS | Status: AC
  Administered 2011-08-14: 800 mg via INTRAVENOUS
  Filled 2011-08-14: qty 80

## 2011-08-14 MED ORDER — ACETAMINOPHEN 325 MG PO TABS
650.0000 mg | ORAL_TABLET | Freq: Once | ORAL | Status: AC
  Administered 2011-08-14: 650 mg via ORAL

## 2011-08-14 MED ORDER — DIPHENHYDRAMINE HCL 25 MG PO CAPS
50.0000 mg | ORAL_CAPSULE | Freq: Once | ORAL | Status: AC
  Administered 2011-08-14: 50 mg via ORAL

## 2011-08-14 MED ORDER — PNEUMOCOCCAL VAC POLYVALENT 25 MCG/0.5ML IJ INJ
0.5000 mL | INJECTION | Freq: Once | INTRAMUSCULAR | Status: AC
  Administered 2011-08-14: 0.5 mL via INTRAMUSCULAR
  Filled 2011-08-14: qty 0.5

## 2011-08-14 MED ORDER — HAEMOPHILUS B POLYSAC CONJ VAC IM SOLR
0.5000 mL | Freq: Once | INTRAMUSCULAR | Status: AC
  Administered 2011-08-14: 0.5 mL via INTRAMUSCULAR
  Filled 2011-08-14: qty 0.5

## 2011-08-14 NOTE — Patient Instructions (Signed)

## 2011-08-15 ENCOUNTER — Encounter: Payer: Self-pay | Admitting: Hematology & Oncology

## 2011-08-15 ENCOUNTER — Ambulatory Visit: Payer: Medicare Other | Attending: Family Medicine | Admitting: Occupational Therapy

## 2011-08-15 DIAGNOSIS — I69919 Unspecified symptoms and signs involving cognitive functions following unspecified cerebrovascular disease: Secondary | ICD-10-CM | POA: Insufficient documentation

## 2011-08-15 DIAGNOSIS — M6281 Muscle weakness (generalized): Secondary | ICD-10-CM | POA: Insufficient documentation

## 2011-08-15 DIAGNOSIS — R5381 Other malaise: Secondary | ICD-10-CM | POA: Insufficient documentation

## 2011-08-15 DIAGNOSIS — R279 Unspecified lack of coordination: Secondary | ICD-10-CM | POA: Insufficient documentation

## 2011-08-15 DIAGNOSIS — Z5189 Encounter for other specified aftercare: Secondary | ICD-10-CM | POA: Insufficient documentation

## 2011-08-15 DIAGNOSIS — R269 Unspecified abnormalities of gait and mobility: Secondary | ICD-10-CM | POA: Insufficient documentation

## 2011-08-15 DIAGNOSIS — I69959 Hemiplegia and hemiparesis following unspecified cerebrovascular disease affecting unspecified side: Secondary | ICD-10-CM | POA: Insufficient documentation

## 2011-08-15 DIAGNOSIS — I69998 Other sequelae following unspecified cerebrovascular disease: Secondary | ICD-10-CM | POA: Insufficient documentation

## 2011-08-20 ENCOUNTER — Ambulatory Visit: Payer: Medicare Other | Admitting: Occupational Therapy

## 2011-08-21 ENCOUNTER — Encounter: Payer: Self-pay | Admitting: *Deleted

## 2011-08-21 ENCOUNTER — Ambulatory Visit: Payer: Medicare Other

## 2011-08-21 ENCOUNTER — Other Ambulatory Visit: Payer: Medicare Other | Admitting: Lab

## 2011-08-21 ENCOUNTER — Ambulatory Visit (HOSPITAL_BASED_OUTPATIENT_CLINIC_OR_DEPARTMENT_OTHER): Payer: Medicare Other

## 2011-08-21 ENCOUNTER — Other Ambulatory Visit (HOSPITAL_BASED_OUTPATIENT_CLINIC_OR_DEPARTMENT_OTHER): Payer: Medicare Other | Admitting: Lab

## 2011-08-21 VITALS — BP 128/78 | HR 82 | Temp 97.8°F

## 2011-08-21 DIAGNOSIS — IMO0002 Reserved for concepts with insufficient information to code with codable children: Secondary | ICD-10-CM

## 2011-08-21 DIAGNOSIS — Z5112 Encounter for antineoplastic immunotherapy: Secondary | ICD-10-CM

## 2011-08-21 DIAGNOSIS — I82409 Acute embolism and thrombosis of unspecified deep veins of unspecified lower extremity: Secondary | ICD-10-CM

## 2011-08-21 DIAGNOSIS — Z7901 Long term (current) use of anticoagulants: Secondary | ICD-10-CM

## 2011-08-21 DIAGNOSIS — D693 Immune thrombocytopenic purpura: Secondary | ICD-10-CM

## 2011-08-21 LAB — CBC WITH DIFFERENTIAL (CANCER CENTER ONLY)
BASO#: 0 10*3/uL (ref 0.0–0.2)
BASO%: 0.2 % (ref 0.0–2.0)
Eosinophils Absolute: 0.5 10*3/uL (ref 0.0–0.5)
HCT: 42.8 % (ref 38.7–49.9)
HGB: 14.5 g/dL (ref 13.0–17.1)
LYMPH#: 2.4 10*3/uL (ref 0.9–3.3)
LYMPH%: 19.6 % (ref 14.0–48.0)
MCV: 90 fL (ref 82–98)
MONO#: 1.7 10*3/uL — ABNORMAL HIGH (ref 0.1–0.9)
NEUT%: 61.9 % (ref 40.0–80.0)
RBC: 4.78 10*6/uL (ref 4.20–5.70)
WBC: 12.4 10*3/uL — ABNORMAL HIGH (ref 4.0–10.0)

## 2011-08-21 MED ORDER — SODIUM CHLORIDE 0.9 % IV SOLN
Freq: Once | INTRAVENOUS | Status: AC
  Administered 2011-08-21: 10:00:00 via INTRAVENOUS

## 2011-08-21 MED ORDER — DIPHENHYDRAMINE HCL 25 MG PO CAPS
50.0000 mg | ORAL_CAPSULE | Freq: Once | ORAL | Status: AC
  Administered 2011-08-21: 50 mg via ORAL

## 2011-08-21 MED ORDER — SODIUM CHLORIDE 0.9 % IV SOLN
375.0000 mg/m2 | Freq: Once | INTRAVENOUS | Status: AC
  Administered 2011-08-21: 800 mg via INTRAVENOUS
  Filled 2011-08-21: qty 80

## 2011-08-21 MED ORDER — ACETAMINOPHEN 325 MG PO TABS
650.0000 mg | ORAL_TABLET | Freq: Once | ORAL | Status: AC
  Administered 2011-08-21: 650 mg via ORAL

## 2011-08-21 NOTE — Patient Instructions (Signed)

## 2011-08-22 ENCOUNTER — Ambulatory Visit: Payer: Medicare Other | Admitting: Occupational Therapy

## 2011-08-26 ENCOUNTER — Other Ambulatory Visit (HOSPITAL_BASED_OUTPATIENT_CLINIC_OR_DEPARTMENT_OTHER): Payer: Medicare Other | Admitting: Lab

## 2011-08-26 ENCOUNTER — Ambulatory Visit (HOSPITAL_BASED_OUTPATIENT_CLINIC_OR_DEPARTMENT_OTHER): Payer: Medicare Other

## 2011-08-26 VITALS — BP 106/61 | HR 60 | Temp 96.5°F

## 2011-08-26 DIAGNOSIS — I69939 Monoplegia of upper limb following unspecified cerebrovascular disease affecting unspecified side: Secondary | ICD-10-CM

## 2011-08-26 DIAGNOSIS — I82409 Acute embolism and thrombosis of unspecified deep veins of unspecified lower extremity: Secondary | ICD-10-CM

## 2011-08-26 DIAGNOSIS — Z5112 Encounter for antineoplastic immunotherapy: Secondary | ICD-10-CM

## 2011-08-26 DIAGNOSIS — IMO0002 Reserved for concepts with insufficient information to code with codable children: Secondary | ICD-10-CM

## 2011-08-26 DIAGNOSIS — D693 Immune thrombocytopenic purpura: Secondary | ICD-10-CM

## 2011-08-26 DIAGNOSIS — Z7901 Long term (current) use of anticoagulants: Secondary | ICD-10-CM

## 2011-08-26 LAB — CBC WITH DIFFERENTIAL (CANCER CENTER ONLY)
BASO%: 0.3 % (ref 0.0–2.0)
HCT: 41.2 % (ref 38.7–49.9)
LYMPH%: 23.3 % (ref 14.0–48.0)
MCH: 30.6 pg (ref 28.0–33.4)
MCHC: 34.7 g/dL (ref 32.0–35.9)
MCV: 88 fL (ref 82–98)
MONO#: 1.4 10*3/uL — ABNORMAL HIGH (ref 0.1–0.9)
NEUT%: 56 % (ref 40.0–80.0)
RDW: 14.5 % (ref 11.1–15.7)
WBC: 9 10*3/uL (ref 4.0–10.0)

## 2011-08-26 LAB — PROTIME-INR (CHCC SATELLITE)
INR: 2.2 (ref 2.0–3.5)
Protime: 26.4 Seconds — ABNORMAL HIGH (ref 10.6–13.4)

## 2011-08-26 MED ORDER — SODIUM CHLORIDE 0.9 % IV SOLN
Freq: Once | INTRAVENOUS | Status: AC
  Administered 2011-08-26: 10:00:00 via INTRAVENOUS

## 2011-08-26 MED ORDER — DIPHENHYDRAMINE HCL 25 MG PO CAPS
50.0000 mg | ORAL_CAPSULE | Freq: Once | ORAL | Status: AC
  Administered 2011-08-26: 50 mg via ORAL

## 2011-08-26 MED ORDER — SODIUM CHLORIDE 0.9 % IV SOLN
375.0000 mg/m2 | Freq: Once | INTRAVENOUS | Status: AC
  Administered 2011-08-26: 800 mg via INTRAVENOUS
  Filled 2011-08-26: qty 80

## 2011-08-26 MED ORDER — ACETAMINOPHEN 325 MG PO TABS
650.0000 mg | ORAL_TABLET | Freq: Once | ORAL | Status: AC
  Administered 2011-08-26: 650 mg via ORAL

## 2011-08-26 NOTE — Patient Instructions (Signed)

## 2011-08-27 ENCOUNTER — Encounter: Payer: Medicare Other | Admitting: Occupational Therapy

## 2011-08-28 ENCOUNTER — Other Ambulatory Visit: Payer: Medicare Other | Admitting: Lab

## 2011-08-28 ENCOUNTER — Ambulatory Visit: Payer: Medicare Other | Admitting: Hematology & Oncology

## 2011-08-28 ENCOUNTER — Ambulatory Visit: Payer: Medicare Other

## 2011-08-30 ENCOUNTER — Encounter: Payer: Self-pay | Admitting: Oncology

## 2011-09-02 ENCOUNTER — Ambulatory Visit (HOSPITAL_BASED_OUTPATIENT_CLINIC_OR_DEPARTMENT_OTHER): Payer: Medicare Other

## 2011-09-02 ENCOUNTER — Other Ambulatory Visit (HOSPITAL_BASED_OUTPATIENT_CLINIC_OR_DEPARTMENT_OTHER): Payer: Medicare Other | Admitting: Lab

## 2011-09-02 VITALS — BP 105/62 | HR 55 | Temp 97.0°F | Wt 191.0 lb

## 2011-09-02 DIAGNOSIS — D693 Immune thrombocytopenic purpura: Secondary | ICD-10-CM

## 2011-09-02 DIAGNOSIS — Z7901 Long term (current) use of anticoagulants: Secondary | ICD-10-CM

## 2011-09-02 DIAGNOSIS — IMO0002 Reserved for concepts with insufficient information to code with codable children: Secondary | ICD-10-CM

## 2011-09-02 DIAGNOSIS — I82409 Acute embolism and thrombosis of unspecified deep veins of unspecified lower extremity: Secondary | ICD-10-CM

## 2011-09-02 DIAGNOSIS — Z5112 Encounter for antineoplastic immunotherapy: Secondary | ICD-10-CM

## 2011-09-02 LAB — CBC WITH DIFFERENTIAL (CANCER CENTER ONLY)
BASO#: 0.1 10*3/uL (ref 0.0–0.2)
Eosinophils Absolute: 0.5 10*3/uL (ref 0.0–0.5)
HGB: 14.1 g/dL (ref 13.0–17.1)
MONO#: 1.3 10*3/uL — ABNORMAL HIGH (ref 0.1–0.9)
NEUT#: 3.4 10*3/uL (ref 1.5–6.5)
Platelets: 56 10*3/uL — ABNORMAL LOW (ref 145–400)
RBC: 4.66 10*6/uL (ref 4.20–5.70)
WBC: 7.4 10*3/uL (ref 4.0–10.0)

## 2011-09-02 LAB — PROTIME-INR (CHCC SATELLITE)
INR: 2 (ref 2.0–3.5)
Protime: 24 Seconds — ABNORMAL HIGH (ref 10.6–13.4)

## 2011-09-02 MED ORDER — DIPHENHYDRAMINE HCL 25 MG PO CAPS
50.0000 mg | ORAL_CAPSULE | Freq: Once | ORAL | Status: AC
  Administered 2011-09-02: 50 mg via ORAL

## 2011-09-02 MED ORDER — SODIUM CHLORIDE 0.9 % IV SOLN
375.0000 mg/m2 | Freq: Once | INTRAVENOUS | Status: AC
  Administered 2011-09-02: 800 mg via INTRAVENOUS
  Filled 2011-09-02: qty 80

## 2011-09-02 MED ORDER — ROMIPLOSTIM 250 MCG ~~LOC~~ SOLR
0.5000 ug/kg | Freq: Once | SUBCUTANEOUS | Status: AC
  Administered 2011-09-02: 45 ug via SUBCUTANEOUS
  Filled 2011-09-02: qty 0.09

## 2011-09-02 MED ORDER — ACETAMINOPHEN 325 MG PO TABS
650.0000 mg | ORAL_TABLET | Freq: Once | ORAL | Status: AC
  Administered 2011-09-02: 650 mg via ORAL

## 2011-09-02 MED ORDER — SODIUM CHLORIDE 0.9 % IV SOLN
Freq: Once | INTRAVENOUS | Status: AC
Start: 2011-09-02 — End: 2011-09-02
  Administered 2011-09-02: 10:00:00 via INTRAVENOUS

## 2011-09-02 NOTE — Progress Notes (Signed)
Chart opened to be sure all charges done.

## 2011-09-02 NOTE — Progress Notes (Signed)
Addended by: Mardi Mainland on: 09/02/2011 04:10 PM   Modules accepted: Orders

## 2011-09-02 NOTE — Patient Instructions (Signed)

## 2011-09-05 ENCOUNTER — Ambulatory Visit: Payer: Medicare Other

## 2011-09-05 ENCOUNTER — Other Ambulatory Visit: Payer: Medicare Other | Admitting: Lab

## 2011-09-06 ENCOUNTER — Other Ambulatory Visit: Payer: Self-pay

## 2011-09-12 ENCOUNTER — Other Ambulatory Visit (HOSPITAL_BASED_OUTPATIENT_CLINIC_OR_DEPARTMENT_OTHER): Payer: Medicare Other | Admitting: Lab

## 2011-09-12 DIAGNOSIS — I82409 Acute embolism and thrombosis of unspecified deep veins of unspecified lower extremity: Secondary | ICD-10-CM

## 2011-09-12 DIAGNOSIS — D693 Immune thrombocytopenic purpura: Secondary | ICD-10-CM

## 2011-09-12 DIAGNOSIS — IMO0002 Reserved for concepts with insufficient information to code with codable children: Secondary | ICD-10-CM

## 2011-09-12 DIAGNOSIS — Z7901 Long term (current) use of anticoagulants: Secondary | ICD-10-CM

## 2011-09-12 LAB — CBC WITH DIFFERENTIAL (CANCER CENTER ONLY)
BASO#: 0 10*3/uL (ref 0.0–0.2)
Eosinophils Absolute: 0.3 10*3/uL (ref 0.0–0.5)
HGB: 13.6 g/dL (ref 13.0–17.1)
LYMPH%: 30.8 % (ref 14.0–48.0)
MCH: 30 pg (ref 28.0–33.4)
MCV: 90 fL (ref 82–98)
MONO#: 1.8 10*3/uL — ABNORMAL HIGH (ref 0.1–0.9)
MONO%: 23.3 % — ABNORMAL HIGH (ref 0.0–13.0)
RBC: 4.54 10*6/uL (ref 4.20–5.70)
WBC: 7.5 10*3/uL (ref 4.0–10.0)

## 2011-09-19 ENCOUNTER — Other Ambulatory Visit (HOSPITAL_BASED_OUTPATIENT_CLINIC_OR_DEPARTMENT_OTHER): Payer: Medicare Other | Admitting: Lab

## 2011-09-19 ENCOUNTER — Ambulatory Visit (HOSPITAL_BASED_OUTPATIENT_CLINIC_OR_DEPARTMENT_OTHER): Payer: Medicare Other | Admitting: Hematology & Oncology

## 2011-09-19 DIAGNOSIS — D6859 Other primary thrombophilia: Secondary | ICD-10-CM

## 2011-09-19 DIAGNOSIS — I69359 Hemiplegia and hemiparesis following cerebral infarction affecting unspecified side: Secondary | ICD-10-CM

## 2011-09-19 DIAGNOSIS — D693 Immune thrombocytopenic purpura: Secondary | ICD-10-CM

## 2011-09-19 DIAGNOSIS — D6862 Lupus anticoagulant syndrome: Secondary | ICD-10-CM

## 2011-09-19 DIAGNOSIS — Z7901 Long term (current) use of anticoagulants: Secondary | ICD-10-CM

## 2011-09-19 DIAGNOSIS — IMO0002 Reserved for concepts with insufficient information to code with codable children: Secondary | ICD-10-CM

## 2011-09-19 DIAGNOSIS — I82409 Acute embolism and thrombosis of unspecified deep veins of unspecified lower extremity: Secondary | ICD-10-CM

## 2011-09-19 LAB — CBC WITH DIFFERENTIAL (CANCER CENTER ONLY)
Eosinophils Absolute: 0.2 10*3/uL (ref 0.0–0.5)
LYMPH#: 2.4 10*3/uL (ref 0.9–3.3)
MONO#: 1.7 10*3/uL — ABNORMAL HIGH (ref 0.1–0.9)
NEUT#: 4.3 10*3/uL (ref 1.5–6.5)
Platelets: 370 10*3/uL (ref 145–400)
RBC: 4.91 10*6/uL (ref 4.20–5.70)
WBC: 8.7 10*3/uL (ref 4.0–10.0)

## 2011-09-19 LAB — TECHNOLOGIST REVIEW CHCC SATELLITE

## 2011-09-19 LAB — PROTIME-INR (CHCC SATELLITE)
INR: 2.3 (ref 2.0–3.5)
Protime: 27.6 Seconds — ABNORMAL HIGH (ref 10.6–13.4)

## 2011-09-19 NOTE — Progress Notes (Signed)
This office note has been dictated.

## 2011-09-20 NOTE — Progress Notes (Signed)
CC:   Kerry Dory, MD  DIAGNOSES: 1. Refractory immune thrombocytopenia. 2. Cerebrovascular accident with subsequent left-sided weakness. 3. Lupus anticoagulant positivity.  CURRENT THERAPY: 1. The patient is status post one 4-week cycle of Rituxan. 2. Coumadin to maintain INR between 2-3. 3. Nplate as indicated for platelets less than 100.  INTERIM HISTORY:  [name] comes in for his followup.  He completed his 4-week cycle of Rituxan on May 20th.  He tolerated this well.  He is doing physical therapy at home right now.  He is getting more function with the left arm and hand.  He is happy about this.  His wife is very dedicated with helping him with his therapy.  He has had no bleeding issues.  He did lose his balance and fall.  He developed a hematoma in the left sacroiliac region.  There is a little bit of a swelling there, but no large ecchymoses.  He has had no headache.  He has had a good appetite.  He has had no leg swelling.  He has had no change in bowel or bladder habits.  He has had no cough.  PHYSICAL EXAMINATION:  General:  This is a well-developed, well- nourished white gentleman in no obvious distress.  Vital Signs:  Show a temperature of 97.6, pulse 65, respiratory rate 18, blood pressure 91/53, weight is 191.  Head and Neck Exam:  Shows a normocephalic, atraumatic skull.  There are no ocular or oral lesions.  There are no palpable cervical or supraclavicular lymph nodes.  Lungs:  Clear bilaterally.  Cardiac Exam:  Regular rate and rhythm with a normal S1 and S2.  There are no murmurs, rubs, or bruits.  Abdominal Exam:  Soft with good bowel sounds.  There is no fluid wave.  There is no guarding or rebound tenderness.  There is no palpable hepatomegaly.  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  Back Exam:  Does show the area of fullness in the left sacroiliac region.  There may be a little bit of excoriation in the center of this area.  There is no  tenderness to palpation.  Extremities:  Show no swelling in his legs.  He does have decreased range of motion in his left arm.  Neurological Exam:  Does show the weakness in the left arm.  LABORATORY STUDIES:  Show a white cell count of 8.7, hemoglobin 15, hematocrit 44.4, platelet count 370.  INR is 2.3.  IMPRESSION:  [name] is a 66 year old gentleman with a lupus anticoagulant.  This made him hypercoagulable.  He also has refractory immune thrombocytopenia.  It is a true "balancing act" with totally opposite ends of the coagulation spectrum.  He clearly has suffered complications from being hypercoagulable.  As such, he is on lifelong Coumadin.  His INR is 2.3, which is okay.  With Rituxan, hopefully we will see a sustained platelet response so that we do not have to use Nplate.  We will still check his CBC and pro-time every week.  I will see him back in 6 weeks' time.    ______________________________ Josph Macho, M.D. PRE/MEDQ  D:  09/19/2011  T:  09/20/2011  Job:  [id number]

## 2011-09-26 ENCOUNTER — Other Ambulatory Visit (HOSPITAL_BASED_OUTPATIENT_CLINIC_OR_DEPARTMENT_OTHER): Payer: Medicare Other | Admitting: Lab

## 2011-09-26 DIAGNOSIS — I69939 Monoplegia of upper limb following unspecified cerebrovascular disease affecting unspecified side: Secondary | ICD-10-CM

## 2011-09-26 DIAGNOSIS — I82409 Acute embolism and thrombosis of unspecified deep veins of unspecified lower extremity: Secondary | ICD-10-CM

## 2011-09-26 DIAGNOSIS — Z7901 Long term (current) use of anticoagulants: Secondary | ICD-10-CM

## 2011-09-26 DIAGNOSIS — D693 Immune thrombocytopenic purpura: Secondary | ICD-10-CM

## 2011-09-26 DIAGNOSIS — IMO0002 Reserved for concepts with insufficient information to code with codable children: Secondary | ICD-10-CM

## 2011-09-26 LAB — CBC WITH DIFFERENTIAL (CANCER CENTER ONLY)
BASO#: 0 10*3/uL (ref 0.0–0.2)
Eosinophils Absolute: 0.5 10*3/uL (ref 0.0–0.5)
HCT: 41.2 % (ref 38.7–49.9)
HGB: 14.1 g/dL (ref 13.0–17.1)
LYMPH%: 27.8 % (ref 14.0–48.0)
MCH: 30.4 pg (ref 28.0–33.4)
MCV: 89 fL (ref 82–98)
MONO#: 1.5 10*3/uL — ABNORMAL HIGH (ref 0.1–0.9)
MONO%: 18 % — ABNORMAL HIGH (ref 0.0–13.0)
Platelets: 133 10*3/uL — ABNORMAL LOW (ref 145–400)
RBC: 4.64 10*6/uL (ref 4.20–5.70)
WBC: 8.6 10*3/uL (ref 4.0–10.0)

## 2011-09-26 LAB — PROTIME-INR (CHCC SATELLITE): Protime: 25.2 Seconds — ABNORMAL HIGH (ref 10.6–13.4)

## 2011-10-03 ENCOUNTER — Other Ambulatory Visit (HOSPITAL_BASED_OUTPATIENT_CLINIC_OR_DEPARTMENT_OTHER): Payer: Medicare Other | Admitting: Lab

## 2011-10-03 DIAGNOSIS — Z7901 Long term (current) use of anticoagulants: Secondary | ICD-10-CM

## 2011-10-03 LAB — CBC WITH DIFFERENTIAL (CANCER CENTER ONLY)
BASO#: 0.1 10*3/uL (ref 0.0–0.2)
Eosinophils Absolute: 1.6 10*3/uL — ABNORMAL HIGH (ref 0.0–0.5)
HGB: 14.5 g/dL (ref 13.0–17.1)
LYMPH%: 23.6 % (ref 14.0–48.0)
MCH: 30.2 pg (ref 28.0–33.4)
MCV: 88 fL (ref 82–98)
MONO#: 1.7 10*3/uL — ABNORMAL HIGH (ref 0.1–0.9)
NEUT#: 4.4 10*3/uL (ref 1.5–6.5)
Platelets: 61 10*3/uL — ABNORMAL LOW (ref 145–400)
RBC: 4.8 10*6/uL (ref 4.20–5.70)
WBC: 10.2 10*3/uL — ABNORMAL HIGH (ref 4.0–10.0)

## 2011-10-03 LAB — PROTIME-INR (CHCC SATELLITE): Protime: 21.6 Seconds — ABNORMAL HIGH (ref 10.6–13.4)

## 2011-10-10 ENCOUNTER — Other Ambulatory Visit (HOSPITAL_BASED_OUTPATIENT_CLINIC_OR_DEPARTMENT_OTHER): Payer: Medicare Other | Admitting: Lab

## 2011-10-10 DIAGNOSIS — Z7901 Long term (current) use of anticoagulants: Secondary | ICD-10-CM

## 2011-10-10 LAB — CBC WITH DIFFERENTIAL (CANCER CENTER ONLY)
BASO%: 0.5 % (ref 0.0–2.0)
EOS%: 7 % (ref 0.0–7.0)
LYMPH%: 23.6 % (ref 14.0–48.0)
MCHC: 34.6 g/dL (ref 32.0–35.9)
MCV: 89 fL (ref 82–98)
MONO#: 1.6 10*3/uL — ABNORMAL HIGH (ref 0.1–0.9)
MONO%: 16.8 % — ABNORMAL HIGH (ref 0.0–13.0)
Platelets: 219 10*3/uL (ref 145–400)
RDW: 14.8 % (ref 11.1–15.7)
WBC: 9.3 10*3/uL (ref 4.0–10.0)

## 2011-10-10 LAB — PROTIME-INR (CHCC SATELLITE)
INR: 2.1 (ref 2.0–3.5)
Protime: 25.2 Seconds — ABNORMAL HIGH (ref 10.6–13.4)

## 2011-10-18 ENCOUNTER — Other Ambulatory Visit: Payer: Medicare Other | Admitting: Lab

## 2011-10-24 ENCOUNTER — Other Ambulatory Visit (HOSPITAL_BASED_OUTPATIENT_CLINIC_OR_DEPARTMENT_OTHER): Payer: Medicare Other | Admitting: Lab

## 2011-10-24 DIAGNOSIS — Z7901 Long term (current) use of anticoagulants: Secondary | ICD-10-CM

## 2011-10-24 DIAGNOSIS — IMO0002 Reserved for concepts with insufficient information to code with codable children: Secondary | ICD-10-CM

## 2011-10-24 DIAGNOSIS — D693 Immune thrombocytopenic purpura: Secondary | ICD-10-CM

## 2011-10-24 DIAGNOSIS — I82409 Acute embolism and thrombosis of unspecified deep veins of unspecified lower extremity: Secondary | ICD-10-CM

## 2011-10-24 LAB — CBC WITH DIFFERENTIAL (CANCER CENTER ONLY)
BASO#: 0 10*3/uL (ref 0.0–0.2)
Eosinophils Absolute: 0.6 10*3/uL — ABNORMAL HIGH (ref 0.0–0.5)
HCT: 41.7 % (ref 38.7–49.9)
HGB: 14.4 g/dL (ref 13.0–17.1)
LYMPH%: 29.3 % (ref 14.0–48.0)
MCH: 30.3 pg (ref 28.0–33.4)
MCV: 88 fL (ref 82–98)
MONO#: 1.7 10*3/uL — ABNORMAL HIGH (ref 0.1–0.9)
NEUT%: 42.2 % (ref 40.0–80.0)
RBC: 4.75 10*6/uL (ref 4.20–5.70)
WBC: 8.1 10*3/uL (ref 4.0–10.0)

## 2011-10-24 LAB — PROTIME-INR (CHCC SATELLITE): Protime: 28.8 Seconds — ABNORMAL HIGH (ref 10.6–13.4)

## 2011-10-31 ENCOUNTER — Other Ambulatory Visit: Payer: Medicare Other | Admitting: Lab

## 2011-10-31 ENCOUNTER — Ambulatory Visit (HOSPITAL_BASED_OUTPATIENT_CLINIC_OR_DEPARTMENT_OTHER): Payer: Medicare Other

## 2011-10-31 ENCOUNTER — Ambulatory Visit (HOSPITAL_BASED_OUTPATIENT_CLINIC_OR_DEPARTMENT_OTHER): Payer: Medicare Other | Admitting: Hematology & Oncology

## 2011-10-31 VITALS — BP 114/66 | HR 76 | Temp 97.3°F | Ht 69.0 in | Wt 199.0 lb

## 2011-10-31 DIAGNOSIS — Z8673 Personal history of transient ischemic attack (TIA), and cerebral infarction without residual deficits: Secondary | ICD-10-CM

## 2011-10-31 DIAGNOSIS — G8194 Hemiplegia, unspecified affecting left nondominant side: Secondary | ICD-10-CM | POA: Insufficient documentation

## 2011-10-31 DIAGNOSIS — D693 Immune thrombocytopenic purpura: Secondary | ICD-10-CM

## 2011-10-31 DIAGNOSIS — I748 Embolism and thrombosis of other arteries: Secondary | ICD-10-CM

## 2011-10-31 DIAGNOSIS — I82409 Acute embolism and thrombosis of unspecified deep veins of unspecified lower extremity: Secondary | ICD-10-CM

## 2011-10-31 DIAGNOSIS — R894 Abnormal immunological findings in specimens from other organs, systems and tissues: Secondary | ICD-10-CM

## 2011-10-31 LAB — CBC WITH DIFFERENTIAL (CANCER CENTER ONLY)
BASO#: 0 10*3/uL (ref 0.0–0.2)
Eosinophils Absolute: 0.5 10*3/uL (ref 0.0–0.5)
HGB: 13.7 g/dL (ref 13.0–17.1)
MONO#: 1.5 10*3/uL — ABNORMAL HIGH (ref 0.1–0.9)
NEUT#: 4.5 10*3/uL (ref 1.5–6.5)
Platelets: 100 10*3/uL — ABNORMAL LOW (ref 145–400)
RBC: 4.49 10*6/uL (ref 4.20–5.70)
WBC: 8.8 10*3/uL (ref 4.0–10.0)

## 2011-10-31 LAB — PROTIME-INR (CHCC SATELLITE)
INR: 2.6 (ref 2.0–3.5)
Protime: 31.2 Seconds — ABNORMAL HIGH (ref 10.6–13.4)

## 2011-10-31 MED ORDER — ROMIPLOSTIM 250 MCG ~~LOC~~ SOLR
0.3300 ug/kg | Freq: Once | SUBCUTANEOUS | Status: AC
  Administered 2011-10-31: 30 ug via SUBCUTANEOUS
  Filled 2011-10-31: qty 0.06

## 2011-10-31 NOTE — Progress Notes (Signed)
This office note has been dictated.

## 2011-10-31 NOTE — Patient Instructions (Signed)
Romiplostim injection What is this medicine? ROMIPLOSTIM (roe mi PLOE stim) helps your body make more platelets. This medicine is used to treat low platelets caused by chronic idiopathic thrombocytopenic purpura (ITP). This medicine may be used for other purposes; ask your health care provider or pharmacist if you have questions. What should I tell my health care provider before I take this medicine? They need to know if you have any of these conditions: -cancer or myelodysplastic syndrome -low blood counts, like low white cell, platelet, or red cell counts -take medicines that treat or prevent blood clots -an unusual or allergic reaction to romiplostim, mannitol, other medicines, foods, dyes, or preservatives -pregnant or trying to get pregnant -breast-feeding How should I use this medicine? This medicine is for injection under the skin. It is given by a health care professional in a hospital or clinic setting. A special MedGuide will be given to you before your injection. Read this information carefully each time. Talk to your pediatrician regarding the use of this medicine in children. Special care may be needed. Overdosage: If you think you have taken too much of this medicine contact a poison control center or emergency room at once. NOTE: This medicine is only for you. Do not share this medicine with others. What if I miss a dose? It is important not to miss your dose. Call your doctor or health care professional if you are unable to keep an appointment. What may interact with this medicine? Interactions are not expected. This list may not describe all possible interactions. Give your health care provider a list of all the medicines, herbs, non-prescription drugs, or dietary supplements you use. Also tell them if you smoke, drink alcohol, or use illegal drugs. Some items may interact with your medicine. What should I watch for while using this medicine? Your condition will be monitored  carefully while you are receiving this medicine. Visit your prescriber or health care professional for regular checks on your progress and for the needed blood tests. It is important to keep all appointments. What side effects may I notice from receiving this medicine? Side effects that you should report to your doctor or health care professional as soon as possible: -allergic reactions like skin rash, itching or hives, swelling of the face, lips, or tongue -shortness of breath, chest pain, swelling in a leg -unusual bleeding or bruising Side effects that usually do not require medical attention (report to your doctor or health care professional if they continue or are bothersome): -dizziness -headache -muscle aches -pain in arms and legs -stomach pain -trouble sleeping This list may not describe all possible side effects. Call your doctor for medical advice about side effects. You may report side effects to FDA at 1-800-FDA-1088. Where should I keep my medicine? This drug is given in a hospital or clinic and will not be stored at home. NOTE: This sheet is a summary. It may not cover all possible information. If you have questions about this medicine, talk to your doctor, pharmacist, or health care provider.  2012, Elsevier/Gold Standard. (11/30/2007 3:13:04 PM) 

## 2011-11-01 NOTE — Progress Notes (Signed)
CC:   Kerry Dory, MD  DIAGNOSES: 1. Refractory idiopathic thrombocytopenic purpura. 2. 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  CURRENT THERAPY: 1. Coumadin to maintain INR between 2-3. 2. Nplate as indicated for platelets less than 100.  INTERIM HISTORY:  Mr. Sagan comes in for followup.  This is probably the best I have seen him look in a while.  His voice is much stronger. His __________ is much more fluid.  He got some great new in that his neurologist feels that he probably will be able to go back to work.  This is incredibly important for Mr. Tiffany.  He is looking forward to this although, it is still a little bit before he will actually work.  He is doing well with the  Coumadin and the Nplate. This is a true "balancing act" to try to maintain his ability to clot versus his ability to bleed.  He has not had Nplate for a good 2 months.  We gave him the lowest dose of Nplate as we can as his response to Nplate is very brisk.  He has had no problem with bowels or bladder.  He has had no increasing back discomfort.  He does have some osteoporosis.  He is on Fosamax.  He does take vitamin D.  PHYSICAL EXAMINATION:  General: This is a well-developed, well-nourished white gentleman in no obvious distress.  Vital signs:  Temperature of 97.3, pulse 76, respiratory rate 20, blood pressure 114/66, weight is 199.  Head and neck:  Normocephalic, atraumatic skull.  There are no ocular or oral lesions.  There are no palpable cervical or supraclavicular lymph nodes.  Lungs:  Clear bilaterally.  Cardiac: Regular rate and rhythm with a normal S1 and S2.  There are no murmurs, rubs or bruits.  Abdomen:  Soft with good bowel sounds.  There is no palpable abdominal mass.  There is no palpable hepatosplenomegaly. Extremities:  Shows the weakness in the left arm.  This is about the same.  He has good strength in his right arm and in his legs.  Skin:  No ecchymosis or  petechiae.  LABORATORIES STUDIES:  Whtie cell count 8.8, hemoglobin 13.7, hematocrit 40.1, platelet count 100.  IMPRESSION:  [name] is a 66 year old gentleman with lupus anticoagulant.  He had a significant CVA from this.  He also has refractory ITP.  Again, we are balancing his coagulation versus bleeding.  He is doing a great job with this right now.  We will go ahead and give him I think, a 35 mcg dose of Nplate today.  I want to get his platelet count up just a little bit.  When his platelet count starts to drop, it drops pretty quickly.  He continues have his blood work done weekly.  I will plan to see him back in 2 months myself.    ______________________________ Josph Macho, M.D. PRE/MEDQ  D:  10/31/2011  T:  11/01/2011  Job:  [id number]

## 2011-11-07 ENCOUNTER — Telehealth: Payer: Self-pay | Admitting: Hematology & Oncology

## 2011-11-07 ENCOUNTER — Other Ambulatory Visit: Payer: Medicare Other | Admitting: Lab

## 2011-11-07 NOTE — Telephone Encounter (Signed)
Patient's wife called and cx 11/07/11 and resch for 11/08/11

## 2011-11-08 ENCOUNTER — Other Ambulatory Visit (HOSPITAL_BASED_OUTPATIENT_CLINIC_OR_DEPARTMENT_OTHER): Payer: Medicare Other | Admitting: Lab

## 2011-11-08 ENCOUNTER — Encounter: Payer: Self-pay | Admitting: *Deleted

## 2011-11-08 DIAGNOSIS — I82409 Acute embolism and thrombosis of unspecified deep veins of unspecified lower extremity: Secondary | ICD-10-CM

## 2011-11-08 DIAGNOSIS — Z8673 Personal history of transient ischemic attack (TIA), and cerebral infarction without residual deficits: Secondary | ICD-10-CM

## 2011-11-08 DIAGNOSIS — D693 Immune thrombocytopenic purpura: Secondary | ICD-10-CM

## 2011-11-08 LAB — PROTIME-INR (CHCC SATELLITE)
INR: 2.5 (ref 2.0–3.5)
Protime: 30 Seconds — ABNORMAL HIGH (ref 10.6–13.4)

## 2011-11-08 LAB — CBC WITH DIFFERENTIAL (CANCER CENTER ONLY)
EOS%: 7.4 % — ABNORMAL HIGH (ref 0.0–7.0)
MCH: 30.2 pg (ref 28.0–33.4)
MCHC: 34.1 g/dL (ref 32.0–35.9)
MONO%: 16.4 % — ABNORMAL HIGH (ref 0.0–13.0)
NEUT#: 5.6 10*3/uL (ref 1.5–6.5)
Platelets: 210 10*3/uL (ref 145–400)
RBC: 4.5 10*6/uL (ref 4.20–5.70)

## 2011-11-08 NOTE — Progress Notes (Signed)
Spoke to pt regarding his INR & plt count. Both were within range 2.5 & 210 respectively. To continue at Coumadin 2.5 mg daily & recheck as scheduled. Does not need n-Plate per Dr Myna Hidalgo. Pt verbalized understanding.

## 2011-11-14 ENCOUNTER — Ambulatory Visit: Payer: Medicare Other

## 2011-11-14 ENCOUNTER — Other Ambulatory Visit: Payer: Medicare Other | Admitting: Lab

## 2011-11-14 ENCOUNTER — Other Ambulatory Visit (HOSPITAL_BASED_OUTPATIENT_CLINIC_OR_DEPARTMENT_OTHER): Payer: Medicare Other | Admitting: Lab

## 2011-11-15 ENCOUNTER — Encounter: Payer: Self-pay | Admitting: *Deleted

## 2011-11-15 ENCOUNTER — Ambulatory Visit: Payer: Medicare Other

## 2011-11-15 ENCOUNTER — Other Ambulatory Visit: Payer: Medicare Other | Admitting: Lab

## 2011-11-15 DIAGNOSIS — Z8673 Personal history of transient ischemic attack (TIA), and cerebral infarction without residual deficits: Secondary | ICD-10-CM

## 2011-11-15 DIAGNOSIS — D693 Immune thrombocytopenic purpura: Secondary | ICD-10-CM

## 2011-11-15 LAB — CBC WITH DIFFERENTIAL (CANCER CENTER ONLY)
Eosinophils Absolute: 0.4 10*3/uL (ref 0.0–0.5)
LYMPH%: 32.4 % (ref 14.0–48.0)
MCH: 30.5 pg (ref 28.0–33.4)
MCHC: 34.2 g/dL (ref 32.0–35.9)
MCV: 89 fL (ref 82–98)
MONO%: 20.1 % — ABNORMAL HIGH (ref 0.0–13.0)
Platelets: 301 10*3/uL (ref 145–400)
RBC: 4.59 10*6/uL (ref 4.20–5.70)

## 2011-11-15 LAB — PROTIME-INR (CHCC SATELLITE)
INR: 2.7 (ref 2.0–3.5)
Protime: 32.4 Seconds — ABNORMAL HIGH (ref 10.6–13.4)

## 2011-11-15 NOTE — Progress Notes (Signed)
Pt did not need any injection today

## 2011-11-15 NOTE — Progress Notes (Signed)
Mark [name] spoke to pt regarding his INR & plt count. Both were within range 2.7 & 301 respectively. To continue at Coumadin 2.5 mg daily & recheck as scheduled. Does not need n-Plate per Dr Myna Hidalgo. Pt verbalized understanding.

## 2011-11-21 ENCOUNTER — Other Ambulatory Visit (HOSPITAL_BASED_OUTPATIENT_CLINIC_OR_DEPARTMENT_OTHER): Payer: Medicare Other | Admitting: Lab

## 2011-11-21 ENCOUNTER — Ambulatory Visit: Payer: Medicare Other

## 2011-11-21 ENCOUNTER — Other Ambulatory Visit: Payer: Medicare Other | Admitting: Lab

## 2011-11-21 DIAGNOSIS — I82409 Acute embolism and thrombosis of unspecified deep veins of unspecified lower extremity: Secondary | ICD-10-CM

## 2011-11-21 DIAGNOSIS — D693 Immune thrombocytopenic purpura: Secondary | ICD-10-CM

## 2011-11-21 LAB — PROTIME-INR (CHCC SATELLITE)
INR: 2.6 (ref 2.0–3.5)
Protime: 31.2 Seconds — ABNORMAL HIGH (ref 10.6–13.4)

## 2011-11-21 LAB — CBC WITH DIFFERENTIAL (CANCER CENTER ONLY)
BASO%: 0.7 % (ref 0.0–2.0)
EOS%: 4.8 % (ref 0.0–7.0)
LYMPH#: 2.3 10*3/uL (ref 0.9–3.3)
MCHC: 34.2 g/dL (ref 32.0–35.9)
NEUT#: 3.1 10*3/uL (ref 1.5–6.5)
NEUT%: 42.8 % (ref 40.0–80.0)
Platelets: 198 10*3/uL (ref 145–400)
RDW: 14.6 % (ref 11.1–15.7)

## 2011-11-21 MED ORDER — ROMIPLOSTIM 250 MCG ~~LOC~~ SOLR: 1.0000 ug/kg | Freq: Once | SUBCUTANEOUS | Status: DC

## 2011-11-21 NOTE — Progress Notes (Signed)
11/21/2011 Patient presents today to have platelet count checked.  Platelets 301.= . NPLATE not given. Patient notified and copy of lab work given. Mark [name], Mark [name] [employer]

## 2011-11-25 ENCOUNTER — Other Ambulatory Visit: Payer: Self-pay | Admitting: *Deleted

## 2011-11-25 DIAGNOSIS — I639 Cerebral infarction, unspecified: Secondary | ICD-10-CM

## 2011-11-25 MED ORDER — ROSUVASTATIN CALCIUM 20 MG PO TABS: 20.0000 mg | ORAL_TABLET | Freq: Every day | ORAL | Status: DC

## 2011-11-28 ENCOUNTER — Telehealth: Payer: Self-pay | Admitting: Hematology & Oncology

## 2011-11-28 ENCOUNTER — Ambulatory Visit: Payer: Medicare Other

## 2011-11-28 ENCOUNTER — Other Ambulatory Visit: Payer: Medicare Other | Admitting: Lab

## 2011-11-28 ENCOUNTER — Other Ambulatory Visit (HOSPITAL_BASED_OUTPATIENT_CLINIC_OR_DEPARTMENT_OTHER): Payer: Medicare Other | Admitting: Lab

## 2011-11-28 DIAGNOSIS — I82409 Acute embolism and thrombosis of unspecified deep veins of unspecified lower extremity: Secondary | ICD-10-CM

## 2011-11-28 DIAGNOSIS — D693 Immune thrombocytopenic purpura: Secondary | ICD-10-CM

## 2011-11-28 LAB — CBC WITH DIFFERENTIAL (CANCER CENTER ONLY)
BASO%: 0.4 % (ref 0.0–2.0)
Eosinophils Absolute: 0.4 10*3/uL (ref 0.0–0.5)
HCT: 40.3 % (ref 38.7–49.9)
LYMPH%: 28 % (ref 14.0–48.0)
MCV: 89 fL (ref 82–98)
MONO#: 1.6 10*3/uL — ABNORMAL HIGH (ref 0.1–0.9)
NEUT%: 47.3 % (ref 40.0–80.0)
RDW: 14.3 % (ref 11.1–15.7)
WBC: 8.3 10*3/uL (ref 4.0–10.0)

## 2011-11-28 NOTE — Progress Notes (Signed)
Mr. Dacanay was at the clinic today, platelets 97,  rescheduled for 11/29/11 for N-Plate.  Teola Bradley, Shay Bartoli [employer]

## 2011-11-28 NOTE — Telephone Encounter (Signed)
Pt aware of 8-16 inj

## 2011-11-29 ENCOUNTER — Ambulatory Visit (HOSPITAL_BASED_OUTPATIENT_CLINIC_OR_DEPARTMENT_OTHER): Payer: Medicare Other

## 2011-11-29 ENCOUNTER — Ambulatory Visit: Payer: Medicare Other

## 2011-11-29 VITALS — BP 130/76 | HR 60 | Temp 97.1°F | Resp 18

## 2011-11-29 DIAGNOSIS — D693 Immune thrombocytopenic purpura: Secondary | ICD-10-CM

## 2011-11-29 MED ORDER — ROMIPLOSTIM 250 MCG ~~LOC~~ SOLR
0.3330 ug/kg | Freq: Once | SUBCUTANEOUS | Status: AC
  Administered 2011-11-29: 30 ug via SUBCUTANEOUS
  Filled 2011-11-29: qty 0.06

## 2011-12-03 DIAGNOSIS — G8110 Spastic hemiplegia affecting unspecified side: Secondary | ICD-10-CM | POA: Insufficient documentation

## 2011-12-05 ENCOUNTER — Other Ambulatory Visit: Payer: Medicare Other | Admitting: Lab

## 2011-12-05 ENCOUNTER — Ambulatory Visit: Payer: Medicare Other

## 2011-12-12 ENCOUNTER — Other Ambulatory Visit (HOSPITAL_BASED_OUTPATIENT_CLINIC_OR_DEPARTMENT_OTHER): Payer: Medicare Other | Admitting: Lab

## 2011-12-12 ENCOUNTER — Telehealth: Payer: Self-pay | Admitting: Hematology & Oncology

## 2011-12-12 NOTE — Telephone Encounter (Signed)
Patient's wife called and cx lab appt for today 12/12/11 and resch for 12/13/11

## 2011-12-13 ENCOUNTER — Other Ambulatory Visit: Payer: Medicare Other | Admitting: Lab

## 2011-12-13 DIAGNOSIS — I82409 Acute embolism and thrombosis of unspecified deep veins of unspecified lower extremity: Secondary | ICD-10-CM

## 2011-12-13 DIAGNOSIS — D693 Immune thrombocytopenic purpura: Secondary | ICD-10-CM

## 2011-12-13 LAB — PROTIME-INR (CHCC SATELLITE)
INR: 2.3 (ref 2.0–3.5)
Protime: 27.6 Seconds — ABNORMAL HIGH (ref 10.6–13.4)

## 2011-12-13 LAB — CBC WITH DIFFERENTIAL (CANCER CENTER ONLY)
EOS%: 7.1 % — ABNORMAL HIGH (ref 0.0–7.0)
MCH: 30.8 pg (ref 28.0–33.4)
MCHC: 34.2 g/dL (ref 32.0–35.9)
MONO%: 17.1 % — ABNORMAL HIGH (ref 0.0–13.0)
NEUT#: 4.5 10*3/uL (ref 1.5–6.5)
Platelets: 356 10*3/uL (ref 145–400)
RDW: 14.8 % (ref 11.1–15.7)

## 2011-12-19 ENCOUNTER — Other Ambulatory Visit: Payer: Medicare Other | Admitting: Lab

## 2011-12-19 LAB — CBC WITH DIFFERENTIAL (CANCER CENTER ONLY)
BASO%: 0.5 % (ref 0.0–2.0)
EOS%: 3.8 % (ref 0.0–7.0)
HCT: 42.7 % (ref 38.7–49.9)
LYMPH%: 31.3 % (ref 14.0–48.0)
MCH: 30.5 pg (ref 28.0–33.4)
MCHC: 34.2 g/dL (ref 32.0–35.9)
MCV: 89 fL (ref 82–98)
MONO#: 1.6 10*3/uL — ABNORMAL HIGH (ref 0.1–0.9)
MONO%: 21.5 % — ABNORMAL HIGH (ref 0.0–13.0)
NEUT%: 42.9 % (ref 40.0–80.0)
Platelets: 225 10*3/uL (ref 145–400)
RDW: 14.5 % (ref 11.1–15.7)

## 2011-12-19 LAB — PROTIME-INR (CHCC SATELLITE): Protime: 31.2 Seconds — ABNORMAL HIGH (ref 10.6–13.4)

## 2011-12-26 ENCOUNTER — Ambulatory Visit (HOSPITAL_BASED_OUTPATIENT_CLINIC_OR_DEPARTMENT_OTHER): Payer: Medicare Other

## 2011-12-26 ENCOUNTER — Other Ambulatory Visit (HOSPITAL_BASED_OUTPATIENT_CLINIC_OR_DEPARTMENT_OTHER): Payer: Medicare Other | Admitting: Lab

## 2011-12-26 VITALS — BP 117/65 | HR 78 | Temp 98.1°F | Resp 20

## 2011-12-26 DIAGNOSIS — D693 Immune thrombocytopenic purpura: Secondary | ICD-10-CM

## 2011-12-26 DIAGNOSIS — I6350 Cerebral infarction due to unspecified occlusion or stenosis of unspecified cerebral artery: Secondary | ICD-10-CM

## 2011-12-26 DIAGNOSIS — D68312 Antiphospholipid antibody with hemorrhagic disorder: Secondary | ICD-10-CM

## 2011-12-26 DIAGNOSIS — I639 Cerebral infarction, unspecified: Secondary | ICD-10-CM

## 2011-12-26 LAB — CBC WITH DIFFERENTIAL (CANCER CENTER ONLY)
BASO%: 0.5 % (ref 0.0–2.0)
EOS%: 3.1 % (ref 0.0–7.0)
HCT: 41.6 % (ref 38.7–49.9)
LYMPH#: 2.1 10*3/uL (ref 0.9–3.3)
MCHC: 34.9 g/dL (ref 32.0–35.9)
NEUT%: 54.4 % (ref 40.0–80.0)
Platelets: 71 10*3/uL — ABNORMAL LOW (ref 145–400)
RDW: 14.2 % (ref 11.1–15.7)
WBC: 8.4 10*3/uL (ref 4.0–10.0)

## 2011-12-26 LAB — PROTIME-INR (CHCC SATELLITE)

## 2011-12-26 MED ORDER — ROMIPLOSTIM 250 MCG ~~LOC~~ SOLR
0.3330 ug/kg | Freq: Once | SUBCUTANEOUS | Status: AC
  Administered 2011-12-26: 30 ug via SUBCUTANEOUS
  Filled 2011-12-26: qty 0.06

## 2011-12-26 NOTE — Patient Instructions (Signed)
Romiplostim injection What is this medicine? ROMIPLOSTIM (roe mi PLOE stim) helps your body make more platelets. This medicine is used to treat low platelets caused by chronic idiopathic thrombocytopenic purpura (ITP). This medicine may be used for other purposes; ask your health care provider or pharmacist if you have questions. What should I tell my health care provider before I take this medicine? They need to know if you have any of these conditions: -cancer or myelodysplastic syndrome -low blood counts, like low white cell, platelet, or red cell counts -take medicines that treat or prevent blood clots -an unusual or allergic reaction to romiplostim, mannitol, other medicines, foods, dyes, or preservatives -pregnant or trying to get pregnant -breast-feeding How should I use this medicine? This medicine is for injection under the skin. It is given by a health care professional in a hospital or clinic setting. A special MedGuide will be given to you before your injection. Read this information carefully each time. Talk to your pediatrician regarding the use of this medicine in children. Special care may be needed. Overdosage: If you think you have taken too much of this medicine contact a poison control center or emergency room at once. NOTE: This medicine is only for you. Do not share this medicine with others. What if I miss a dose? It is important not to miss your dose. Call your doctor or health care professional if you are unable to keep an appointment. What may interact with this medicine? Interactions are not expected. This list may not describe all possible interactions. Give your health care provider a list of all the medicines, herbs, non-prescription drugs, or dietary supplements you use. Also tell them if you smoke, drink alcohol, or use illegal drugs. Some items may interact with your medicine. What should I watch for while using this medicine? Your condition will be monitored  carefully while you are receiving this medicine. Visit your prescriber or health care professional for regular checks on your progress and for the needed blood tests. It is important to keep all appointments. What side effects may I notice from receiving this medicine? Side effects that you should report to your doctor or health care professional as soon as possible: -allergic reactions like skin rash, itching or hives, swelling of the face, lips, or tongue -shortness of breath, chest pain, swelling in a leg -unusual bleeding or bruising Side effects that usually do not require medical attention (report to your doctor or health care professional if they continue or are bothersome): -dizziness -headache -muscle aches -pain in arms and legs -stomach pain -trouble sleeping This list may not describe all possible side effects. Call your doctor for medical advice about side effects. You may report side effects to FDA at 1-800-FDA-1088. Where should I keep my medicine? This drug is given in a hospital or clinic and will not be stored at home. NOTE: This sheet is a summary. It may not cover all possible information. If you have questions about this medicine, talk to your doctor, pharmacist, or health care provider.  2012, Elsevier/Gold Standard. (11/30/2007 3:13:04 PM) 

## 2012-01-02 ENCOUNTER — Ambulatory Visit (HOSPITAL_BASED_OUTPATIENT_CLINIC_OR_DEPARTMENT_OTHER): Payer: Medicare Other | Admitting: Hematology & Oncology

## 2012-01-02 ENCOUNTER — Other Ambulatory Visit (HOSPITAL_BASED_OUTPATIENT_CLINIC_OR_DEPARTMENT_OTHER): Payer: Medicare Other | Admitting: Lab

## 2012-01-02 ENCOUNTER — Ambulatory Visit: Payer: Medicare Other

## 2012-01-02 ENCOUNTER — Other Ambulatory Visit: Payer: Medicare Other | Admitting: Lab

## 2012-01-02 ENCOUNTER — Ambulatory Visit: Payer: Medicare Other | Admitting: Hematology & Oncology

## 2012-01-02 VITALS — BP 117/79 | HR 76 | Temp 98.0°F | Resp 20 | Ht 69.0 in | Wt 191.0 lb

## 2012-01-02 DIAGNOSIS — Z8673 Personal history of transient ischemic attack (TIA), and cerebral infarction without residual deficits: Secondary | ICD-10-CM

## 2012-01-02 DIAGNOSIS — I82409 Acute embolism and thrombosis of unspecified deep veins of unspecified lower extremity: Secondary | ICD-10-CM

## 2012-01-02 DIAGNOSIS — D693 Immune thrombocytopenic purpura: Secondary | ICD-10-CM

## 2012-01-02 DIAGNOSIS — R894 Abnormal immunological findings in specimens from other organs, systems and tissues: Secondary | ICD-10-CM

## 2012-01-02 DIAGNOSIS — Z7901 Long term (current) use of anticoagulants: Secondary | ICD-10-CM

## 2012-01-02 LAB — CBC WITH DIFFERENTIAL (CANCER CENTER ONLY)
BASO%: 0.4 % (ref 0.0–2.0)
LYMPH#: 2.3 10*3/uL (ref 0.9–3.3)
MONO#: 1.5 10*3/uL — ABNORMAL HIGH (ref 0.1–0.9)
NEUT#: 3.9 10*3/uL (ref 1.5–6.5)
Platelets: 129 10*3/uL — ABNORMAL LOW (ref 145–400)
RDW: 14.2 % (ref 11.1–15.7)
WBC: 8.1 10*3/uL (ref 4.0–10.0)

## 2012-01-02 LAB — PROTIME-INR (CHCC SATELLITE)
INR: 2.6 (ref 2.0–3.5)
Protime: 31.2 Seconds — ABNORMAL HIGH (ref 10.6–13.4)

## 2012-01-02 NOTE — Progress Notes (Signed)
This office note has been dictated.

## 2012-01-02 NOTE — Progress Notes (Unsigned)
Mr. Quattrone comes in today for Doctor's appointment, CBC, Platelets 129, no Nplate per Dr. Myna Hidalgo. Teola Bradley, Prosperity Darrough [employer]

## 2012-01-03 NOTE — Progress Notes (Signed)
CC:   Mark Dory, MD  DIAGNOSES: 1. Immune thrombocytopenia, refractory. 2. Lupus anticoagulant-induced cerebrovascular accident.  CURRENT THERAPY: 1. Coumadin to maintain INR between 2-3. 2. Nplate as indicated for platelets less than 100.  INTERIM HISTORY:  Mark [name] comes in for his followup.  He continues to improve.  He really looks better than when we last saw him.  He sounds better.  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  He is still getting therapy, which is helping him.  He is walking 3 or 4 miles a day.  His appetite is good.  Back does not bother him as much.  He is, I think, getting more strength with respect to his left side.  He has had no bleeding or bruising.  He has had no headache.  He has had no leg swelling.  PHYSICAL EXAMINATION:  This is a fairly well-developed, well-nourished white gentleman in no obvious distress.  Vital signs:  98, pulse 76, respiratory rate 20, blood pressure 117/79.  Weight is 191.  Head and neck:  Normocephalic, atraumatic skull.  There are no ocular or oral lesions.  There are no palpable or cervical or supraclavicular lymph nodes.  Lungs:  Clear bilaterally.  Cardiac:  Regular rate and rhythm with a normal S1 and S2.  There are no murmurs or rubs, or bruits. Abdomen:  Soft with good bowel sounds.  There is no palpable abdominal mass.  There is no palpable hepatosplenomegaly.  Back:  No tenderness over the spine, ribs, or hips.  Extremities:  Some continued weakness in the left arm.  He has good strength in his right arm and legs.  Skin: No rashes, ecchymosis or petechia.  LABORATORY STUDIES:  White cell count is 8.1, hemoglobin 14.5, hematocrit 42.4, platelet count 129.  INR is 2.6.  IMPRESSION:  Mark [name] is a 66 year old gentleman with both a coagulopathy and a bleeding problem.  He has refractory immune thrombocytopenia.  He is on Nplate for this.  He responds well to Nplate.  We do not need to give him any  Nplate today.  His clinical problem has been the cerebrovascular accident from his lupus anticoagulant.  He is on lifelong Coumadin.  We will continue to monitor his INR.  I think we can probably go every 2 weeks now for a pro time and CBC check.  I am just glad to see that his function is improving.  His quality of life certainly is looking better.  I want to see him back myself in 6 weeks.  Again, will get his blood work every 2 weeks.    ______________________________ Josph Macho, M.D. PRE/MEDQ  D:  01/02/2012  T:  01/03/2012  Job:  [id number]

## 2012-01-16 ENCOUNTER — Ambulatory Visit: Payer: Self-pay | Admitting: Oncology

## 2012-01-16 ENCOUNTER — Other Ambulatory Visit (HOSPITAL_BASED_OUTPATIENT_CLINIC_OR_DEPARTMENT_OTHER): Payer: Medicare Other | Admitting: Lab

## 2012-01-16 DIAGNOSIS — Z7901 Long term (current) use of anticoagulants: Secondary | ICD-10-CM

## 2012-01-16 DIAGNOSIS — I82409 Acute embolism and thrombosis of unspecified deep veins of unspecified lower extremity: Secondary | ICD-10-CM

## 2012-01-16 DIAGNOSIS — D693 Immune thrombocytopenic purpura: Secondary | ICD-10-CM

## 2012-01-16 LAB — PROTIME-INR (CHCC SATELLITE): Protime: 45.6 Seconds — ABNORMAL HIGH (ref 10.6–13.4)

## 2012-01-16 LAB — CBC WITH DIFFERENTIAL (CANCER CENTER ONLY)
Eosinophils Absolute: 0.4 10*3/uL (ref 0.0–0.5)
LYMPH%: 31.9 % (ref 14.0–48.0)
MCH: 30.1 pg (ref 28.0–33.4)
MCV: 88 fL (ref 82–98)
MONO%: 19.7 % — ABNORMAL HIGH (ref 0.0–13.0)
NEUT%: 42.5 % (ref 40.0–80.0)
Platelets: 216 10*3/uL (ref 145–400)
RBC: 4.82 10*6/uL (ref 4.20–5.70)

## 2012-01-16 NOTE — Progress Notes (Signed)
Mr. Gaymon is here today for lab appointment only.  INR 3.8, Dr. Myna Hidalgo notified, takes Coumadin 2.5mg  daily and of Vit K.  No changes to dosage made.  No N-plate indicated per Dr. Myna Hidalgo.  Explained to patient and wife, verbalized understanding.  Also stressed importance of keeping dietary intake of vit K fairly constant.  Teola Bradley, Shelvie Salsberry [employer]

## 2012-01-24 ENCOUNTER — Encounter: Payer: Self-pay | Admitting: *Deleted

## 2012-01-24 ENCOUNTER — Other Ambulatory Visit: Payer: Self-pay | Admitting: *Deleted

## 2012-01-24 DIAGNOSIS — D693 Immune thrombocytopenic purpura: Secondary | ICD-10-CM

## 2012-01-24 MED ORDER — DEXAMETHASONE 4 MG PO TABS: ORAL_TABLET | ORAL | Status: DC

## 2012-01-24 NOTE — Progress Notes (Signed)
Pt called to report he has petechiae and thinks he needs some Decadron.  Dr. Myna Hidalgo agrees and rx sent to pharmacy.  Pt will come into office 01/27/12 for CBC.

## 2012-01-27 ENCOUNTER — Encounter: Payer: Self-pay | Admitting: *Deleted

## 2012-01-27 ENCOUNTER — Ambulatory Visit (HOSPITAL_BASED_OUTPATIENT_CLINIC_OR_DEPARTMENT_OTHER): Payer: Medicare Other | Admitting: Lab

## 2012-01-27 DIAGNOSIS — I82409 Acute embolism and thrombosis of unspecified deep veins of unspecified lower extremity: Secondary | ICD-10-CM

## 2012-01-27 DIAGNOSIS — D693 Immune thrombocytopenic purpura: Secondary | ICD-10-CM

## 2012-01-27 LAB — CBC WITH DIFFERENTIAL (CANCER CENTER ONLY)
BASO#: 0 10*3/uL (ref 0.0–0.2)
Eosinophils Absolute: 0 10*3/uL (ref 0.0–0.5)
HCT: 39.9 % (ref 38.7–49.9)
HGB: 13.6 g/dL (ref 13.0–17.1)
LYMPH%: 4.5 % — ABNORMAL LOW (ref 14.0–48.0)
MCH: 30.2 pg (ref 28.0–33.4)
MCV: 89 fL (ref 82–98)
MONO%: 11.4 % (ref 0.0–13.0)
NEUT%: 84 % — ABNORMAL HIGH (ref 40.0–80.0)
Platelets: 105 10*3/uL — ABNORMAL LOW (ref 145–400)
RBC: 4.51 10*6/uL (ref 4.20–5.70)

## 2012-01-27 LAB — PROTHROMBIN TIME
INR: 4.02 — ABNORMAL HIGH (ref <1.50)
Prothrombin Time: 37 seconds — ABNORMAL HIGH (ref 11.6–15.2)

## 2012-01-27 NOTE — Progress Notes (Signed)
Reviewed preliminary INR (5.2) with the pt. He and his wife deny the use of any new or change of medications. Reviewed bleeding precautions. They understand to report to the ER immediately if he has any s/s of bleeding i.e. bad headache, blood in urine or stool, bruising, nose bleeds, bleeding gums, etc. To hold off taking any further Coumadin until INR is confirmed per Dr Myna Hidalgo.   Plt count >100. No Pomalyst needed at this time per Dr. Myna Hidalgo.

## 2012-01-29 DIAGNOSIS — B351 Tinea unguium: Secondary | ICD-10-CM | POA: Insufficient documentation

## 2012-01-29 DIAGNOSIS — L821 Other seborrheic keratosis: Secondary | ICD-10-CM | POA: Insufficient documentation

## 2012-01-30 ENCOUNTER — Other Ambulatory Visit (HOSPITAL_BASED_OUTPATIENT_CLINIC_OR_DEPARTMENT_OTHER): Payer: Medicare Other | Admitting: Lab

## 2012-01-30 DIAGNOSIS — D6859 Other primary thrombophilia: Secondary | ICD-10-CM

## 2012-01-30 DIAGNOSIS — I82409 Acute embolism and thrombosis of unspecified deep veins of unspecified lower extremity: Secondary | ICD-10-CM

## 2012-01-30 LAB — PROTIME-INR (CHCC SATELLITE): Protime: 30 Seconds — ABNORMAL HIGH (ref 10.6–13.4)

## 2012-02-13 ENCOUNTER — Other Ambulatory Visit: Payer: Medicare Other | Admitting: Lab

## 2012-02-13 ENCOUNTER — Telehealth: Payer: Self-pay | Admitting: Hematology & Oncology

## 2012-02-13 ENCOUNTER — Ambulatory Visit: Payer: Medicare Other | Admitting: Hematology & Oncology

## 2012-02-13 NOTE — Telephone Encounter (Signed)
Pt cx 10-31 appointments and rescheduled lab for 11-1 and will reschedule MD when he comes in for lab

## 2012-02-14 ENCOUNTER — Ambulatory Visit (HOSPITAL_BASED_OUTPATIENT_CLINIC_OR_DEPARTMENT_OTHER): Payer: Medicare Other | Admitting: Lab

## 2012-02-14 ENCOUNTER — Encounter: Payer: Self-pay | Admitting: *Deleted

## 2012-02-14 DIAGNOSIS — I82409 Acute embolism and thrombosis of unspecified deep veins of unspecified lower extremity: Secondary | ICD-10-CM

## 2012-02-14 DIAGNOSIS — D693 Immune thrombocytopenic purpura: Secondary | ICD-10-CM

## 2012-02-14 LAB — CBC WITH DIFFERENTIAL (CANCER CENTER ONLY)
BASO%: 0.6 % (ref 0.0–2.0)
EOS%: 4.2 % (ref 0.0–7.0)
LYMPH%: 25.2 % (ref 14.0–48.0)
MCH: 30.2 pg (ref 28.0–33.4)
MCV: 89 fL (ref 82–98)
MONO%: 14 % — ABNORMAL HIGH (ref 0.0–13.0)
Platelets: 212 10*3/uL (ref 145–400)
RDW: 14.3 % (ref 11.1–15.7)

## 2012-02-14 LAB — PROTIME-INR (CHCC SATELLITE): Protime: 49.2 Seconds — ABNORMAL HIGH (ref 10.6–13.4)

## 2012-02-21 ENCOUNTER — Ambulatory Visit (HOSPITAL_BASED_OUTPATIENT_CLINIC_OR_DEPARTMENT_OTHER): Payer: Medicare Other | Admitting: Hematology & Oncology

## 2012-02-21 VITALS — BP 108/60 | HR 66 | Temp 98.0°F | Resp 20 | Ht 72.0 in | Wt 191.0 lb

## 2012-02-21 DIAGNOSIS — Z7901 Long term (current) use of anticoagulants: Secondary | ICD-10-CM

## 2012-02-21 DIAGNOSIS — Z8673 Personal history of transient ischemic attack (TIA), and cerebral infarction without residual deficits: Secondary | ICD-10-CM

## 2012-02-21 DIAGNOSIS — D693 Immune thrombocytopenic purpura: Secondary | ICD-10-CM

## 2012-02-21 DIAGNOSIS — I82409 Acute embolism and thrombosis of unspecified deep veins of unspecified lower extremity: Secondary | ICD-10-CM

## 2012-02-21 DIAGNOSIS — R894 Abnormal immunological findings in specimens from other organs, systems and tissues: Secondary | ICD-10-CM

## 2012-02-21 NOTE — Progress Notes (Signed)
This office note has been dictated.

## 2012-02-22 NOTE — Progress Notes (Signed)
CC:   Kerry Dory, MD  DIAGNOSES: 1. Refractory immune thrombocytopenia. 2. 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  CURRENT THERAPY: 1. Lifelong Coumadin to maintain INR between 2-3. 2. Nplate for platelets less than 100,000.  INTERIM HISTORY:  Mr. Schroll comes in for his followup.  He is doing well.  He is still doing therapy.  He is working real hard at therapy. He still has the left-sided weakness but this is mostly with his left arm.  He is walking better.  He is eating okay.  He has had no fevers, sweats or chills.  He has had no bleeding.  He has had no cough or shortness of breath.  PHYSICAL EXAMINATION:  General:  This is a well-developed, well- nourished white gentleman in no obvious distress.  Vital signs:  Show temperature of 98, pulse 66, respiratory rate 20, blood pressure 108/60. Weight is 191.  Head and neck:  Shows a normocephalic, atraumatic skull. There are no ocular or oral lesions.  There are no palpable cervical or supraclavicular lymph nodes.  Lungs:  Clear bilaterally.  Cardiac: Regular rate and rhythm with a normal S1 and S2.  There are no murmurs, rubs or bruits.  Abdomen:  Soft with good bowel sounds.  There is no palpable abdominal mass.  There is no palpable hepatosplenomegaly. Extremities:  Shows weakness in his left arm.  He has no swelling in his legs.  He has good range motion of his joints.  Neurological:  Shows the weakness in the left arm.  LABORATORY STUDIES:  White cell count is 10.4, hemoglobin 14, hematocrit 41, platelet count 212.  INR is 4.1.  IMPRESSION:  Mr. Howdeshell is a 66 year old gentleman with both a hypercoagulable and hypocoagulable problem.  This is a "balancing act". He is on Nplate to try to keep his platelets above 100,000.  He is on Coumadin so that his blood does not clot and cause another stroke.  We are following his blood every 2 weeks.  We are adjusting his doses of Coumadin accordingly.  He  actually did not have blood work done today.  The blood work that we have is from last week.  He will come in next week for his next set of labs.  I will plan to see him back in about 6 weeks myself.    ______________________________ Josph Macho, M.D. PRE/MEDQ  D:  02/21/2012  T:  02/22/2012  Job:  [id number]

## 2012-02-25 ENCOUNTER — Other Ambulatory Visit: Payer: Self-pay | Admitting: *Deleted

## 2012-02-25 DIAGNOSIS — D693 Immune thrombocytopenic purpura: Secondary | ICD-10-CM

## 2012-02-25 MED ORDER — WARFARIN SODIUM 5 MG PO TABS: 5.0000 mg | ORAL_TABLET | Freq: Every day | ORAL | Status: DC

## 2012-02-27 ENCOUNTER — Other Ambulatory Visit (HOSPITAL_BASED_OUTPATIENT_CLINIC_OR_DEPARTMENT_OTHER): Payer: Medicare Other | Admitting: Lab

## 2012-02-27 DIAGNOSIS — Z7901 Long term (current) use of anticoagulants: Secondary | ICD-10-CM

## 2012-02-27 DIAGNOSIS — I82409 Acute embolism and thrombosis of unspecified deep veins of unspecified lower extremity: Secondary | ICD-10-CM

## 2012-02-27 LAB — CBC WITH DIFFERENTIAL (CANCER CENTER ONLY)
BASO%: 0.3 % (ref 0.0–2.0)
Eosinophils Absolute: 0.3 10*3/uL (ref 0.0–0.5)
LYMPH%: 24.5 % (ref 14.0–48.0)
MCH: 30.3 pg (ref 28.0–33.4)
MCV: 89 fL (ref 82–98)
MONO%: 16.5 % — ABNORMAL HIGH (ref 0.0–13.0)
Platelets: 178 10*3/uL (ref 145–400)
RDW: 14.1 % (ref 11.1–15.7)

## 2012-02-27 LAB — PROTIME-INR (CHCC SATELLITE)
INR: 2.5 (ref 2.0–3.5)
Protime: 30 Seconds — ABNORMAL HIGH (ref 10.6–13.4)

## 2012-03-10 ENCOUNTER — Other Ambulatory Visit (HOSPITAL_BASED_OUTPATIENT_CLINIC_OR_DEPARTMENT_OTHER): Payer: Medicare Other | Admitting: Lab

## 2012-03-10 DIAGNOSIS — Z8673 Personal history of transient ischemic attack (TIA), and cerebral infarction without residual deficits: Secondary | ICD-10-CM

## 2012-03-10 DIAGNOSIS — I82409 Acute embolism and thrombosis of unspecified deep veins of unspecified lower extremity: Secondary | ICD-10-CM

## 2012-03-10 LAB — CBC WITH DIFFERENTIAL (CANCER CENTER ONLY)
BASO%: 0.5 % (ref 0.0–2.0)
Eosinophils Absolute: 0.3 10*3/uL (ref 0.0–0.5)
LYMPH#: 2.2 10*3/uL (ref 0.9–3.3)
LYMPH%: 28.5 % (ref 14.0–48.0)
MCV: 88 fL (ref 82–98)
MONO#: 1.7 10*3/uL — ABNORMAL HIGH (ref 0.1–0.9)
NEUT#: 3.5 10*3/uL (ref 1.5–6.5)
Platelets: 229 10*3/uL (ref 145–400)
RBC: 4.98 10*6/uL (ref 4.20–5.70)
RDW: 14.5 % (ref 11.1–15.7)
WBC: 7.7 10*3/uL (ref 4.0–10.0)

## 2012-03-10 LAB — PROTIME-INR (CHCC SATELLITE): Protime: 34.8 Seconds — ABNORMAL HIGH (ref 10.6–13.4)

## 2012-03-13 ENCOUNTER — Other Ambulatory Visit: Payer: Medicare Other | Admitting: Lab

## 2012-03-19 ENCOUNTER — Telehealth: Payer: Self-pay | Admitting: Hematology & Oncology

## 2012-03-19 NOTE — Telephone Encounter (Signed)
Patient called and cx 03/31/12 apt and resch for 03/24/12

## 2012-03-24 ENCOUNTER — Ambulatory Visit: Payer: Medicare Other | Admitting: Hematology & Oncology

## 2012-03-24 ENCOUNTER — Other Ambulatory Visit (HOSPITAL_BASED_OUTPATIENT_CLINIC_OR_DEPARTMENT_OTHER): Payer: Medicare Other | Admitting: Lab

## 2012-03-24 DIAGNOSIS — I82409 Acute embolism and thrombosis of unspecified deep veins of unspecified lower extremity: Secondary | ICD-10-CM

## 2012-03-24 LAB — CBC WITH DIFFERENTIAL (CANCER CENTER ONLY)
BASO%: 0.3 % (ref 0.0–2.0)
Eosinophils Absolute: 0.5 10*3/uL (ref 0.0–0.5)
HCT: 43.1 % (ref 38.7–49.9)
LYMPH#: 2.5 10*3/uL (ref 0.9–3.3)
MCV: 89 fL (ref 82–98)
MONO#: 1.9 10*3/uL — ABNORMAL HIGH (ref 0.1–0.9)
NEUT%: 45.9 % (ref 40.0–80.0)
RBC: 4.82 10*6/uL (ref 4.20–5.70)
RDW: 14.7 % (ref 11.1–15.7)
WBC: 9.2 10*3/uL (ref 4.0–10.0)

## 2012-03-24 LAB — PROTIME-INR (CHCC SATELLITE)
INR: 2.6 (ref 2.0–3.5)
Protime: 31.2 Seconds — ABNORMAL HIGH (ref 10.6–13.4)

## 2012-03-31 ENCOUNTER — Other Ambulatory Visit: Payer: Medicare Other | Admitting: Lab

## 2012-03-31 ENCOUNTER — Ambulatory Visit: Payer: Medicare Other | Admitting: Hematology & Oncology

## 2012-04-02 ENCOUNTER — Other Ambulatory Visit: Payer: Medicare Other | Admitting: Lab

## 2012-04-02 ENCOUNTER — Ambulatory Visit (HOSPITAL_BASED_OUTPATIENT_CLINIC_OR_DEPARTMENT_OTHER): Payer: Medicare Other | Admitting: Hematology & Oncology

## 2012-04-02 ENCOUNTER — Other Ambulatory Visit (HOSPITAL_BASED_OUTPATIENT_CLINIC_OR_DEPARTMENT_OTHER): Payer: Medicare Other | Admitting: Lab

## 2012-04-02 VITALS — BP 125/68 | HR 76 | Temp 98.1°F | Resp 18 | Ht 70.0 in | Wt 193.0 lb

## 2012-04-02 DIAGNOSIS — D6859 Other primary thrombophilia: Secondary | ICD-10-CM

## 2012-04-02 DIAGNOSIS — D693 Immune thrombocytopenic purpura: Secondary | ICD-10-CM

## 2012-04-02 DIAGNOSIS — I639 Cerebral infarction, unspecified: Secondary | ICD-10-CM

## 2012-04-02 DIAGNOSIS — I82409 Acute embolism and thrombosis of unspecified deep veins of unspecified lower extremity: Secondary | ICD-10-CM

## 2012-04-02 DIAGNOSIS — Z7901 Long term (current) use of anticoagulants: Secondary | ICD-10-CM

## 2012-04-02 DIAGNOSIS — D6862 Lupus anticoagulant syndrome: Secondary | ICD-10-CM

## 2012-04-02 LAB — CBC WITH DIFFERENTIAL (CANCER CENTER ONLY)
BASO%: 0.6 % (ref 0.0–2.0)
EOS%: 8.2 % — ABNORMAL HIGH (ref 0.0–7.0)
LYMPH%: 25.8 % (ref 14.0–48.0)
MCH: 30.2 pg (ref 28.0–33.4)
MCV: 88 fL (ref 82–98)
MONO%: 18.4 % — ABNORMAL HIGH (ref 0.0–13.0)
NEUT#: 4.3 10*3/uL (ref 1.5–6.5)
Platelets: 240 10*3/uL (ref 145–400)
RDW: 14.9 % (ref 11.1–15.7)
WBC: 9.1 10*3/uL (ref 4.0–10.0)

## 2012-04-02 LAB — PROTIME-INR (CHCC SATELLITE)
INR: 3 (ref 2.0–3.5)
Protime: 36 Seconds — ABNORMAL HIGH (ref 10.6–13.4)

## 2012-04-02 NOTE — Progress Notes (Signed)
This office note has been dictated.

## 2012-04-03 NOTE — Progress Notes (Signed)
CC:   Kerry Dory, MD [place]. Cliffton Asters, M.D.  DIAGNOSES: 1. Refractory immune thrombocytopenia. 2. Cerebrovascular accident. 3. Lupus anticoagulant.  CURRENT THERAPY: 1. Coumadin to maintain INR between 2-3. 2. Nplate for platelet count less than 100,000.  INTERIM HISTORY:  Mark [name] comes in for his followup.  He is doing pretty well.  Continues to improve slowly, but surely from this CVA.  He is still doing some physical therapy.  He still has some weakness in the left arm.  This, again, seems to be improving.  Today he is trying to walk more.  He tries to get out to walk a mile or more every day, if possible.  He has had no bleeding.  He has had no change in bowel or bladder habits.  He has had no cough.  He has had no arm or leg swelling.  He has not noticed any issues with headache.  There are no fevers, sweats, or chills.  It has been a little over a year since he had his CVA.  His recovery has been quite remarkable.  PHYSICAL EXAMINATION:  General:  This is a well-developed, well- nourished white gentleman in no obvious distress.  Vital signs: Temperature of 98, pulse 76, respiratory rate 18, blood pressure 125/68. Weight is 193.  Head and neck:  Normocephalic, atraumatic skull.  There are no ocular or oral lesions.  There are no palpable cervical or supraclavicular lymph nodes.  Lungs:  Clear to percussion and auscultation bilaterally.  Cardiac:  Regular rate and rhythm with a normal S1 and S2.  There are no murmurs, rubs, or bruits.  Abdomen: Soft with good bowel sounds.  There is no palpable abdominal mass. There is no fluid wave.  There is no palpable hepatosplenomegaly. Extremities:  Some continued weakness in the left arm.  There is some movement.  Right arm strength is strong.  He has good strength in his legs.  There may be some slight weakness in the left leg, but it is minimal.  Skin:  No rashes, ecchymosis, or petechia.  Neurological: Weakness on the left  side, but again, this appears to be improving.  LABORATORY STUDIES:  White cell count is 9.1, hemoglobin 14.9, hematocrit 43.6, platelet count 240.  INR is 3.  IMPRESSION:  Mark [name] is a 66 year old white gentleman with both refractory immune thrombocytopenia, he is also hypercoagulable with a lupus anticoagulant.  So far, he has done a very good job in balancing the 2 extremes.  We have not had any bleeding issues or clotting issues.  We have to check his blood every couple of weeks to make sure that he is therapeutic on Coumadin and that his platelet count is not too high or too low.  We will have Mark [name] come back to see me in 6 weeks.    ______________________________ Josph Macho, M.D. PRE/MEDQ  D:  04/02/2012  T:  04/03/2012  Job:  [id number]

## 2012-04-16 ENCOUNTER — Other Ambulatory Visit (HOSPITAL_BASED_OUTPATIENT_CLINIC_OR_DEPARTMENT_OTHER): Payer: Medicare Other | Admitting: Lab

## 2012-04-16 DIAGNOSIS — I82409 Acute embolism and thrombosis of unspecified deep veins of unspecified lower extremity: Secondary | ICD-10-CM

## 2012-04-16 LAB — CBC WITH DIFFERENTIAL (CANCER CENTER ONLY)
Eosinophils Absolute: 0.5 10*3/uL (ref 0.0–0.5)
HGB: 15.4 g/dL (ref 13.0–17.1)
LYMPH#: 2.6 10*3/uL (ref 0.9–3.3)
MCH: 29.6 pg (ref 28.0–33.4)
MONO#: 1.6 10*3/uL — ABNORMAL HIGH (ref 0.1–0.9)
MONO%: 17 % — ABNORMAL HIGH (ref 0.0–13.0)
NEUT#: 4.5 10*3/uL (ref 1.5–6.5)
Platelets: 112 10*3/uL — ABNORMAL LOW (ref 145–400)
RBC: 5.2 10*6/uL (ref 4.20–5.70)
WBC: 9.2 10*3/uL (ref 4.0–10.0)

## 2012-04-16 LAB — PROTIME-INR (CHCC SATELLITE)
INR: 2.4 (ref 2.0–3.5)
Protime: 28.8 Seconds — ABNORMAL HIGH (ref 10.6–13.4)

## 2012-04-17 ENCOUNTER — Other Ambulatory Visit: Payer: Medicare Other | Admitting: Lab

## 2012-04-30 ENCOUNTER — Other Ambulatory Visit (HOSPITAL_BASED_OUTPATIENT_CLINIC_OR_DEPARTMENT_OTHER): Payer: Medicare Other | Admitting: Lab

## 2012-04-30 DIAGNOSIS — I82409 Acute embolism and thrombosis of unspecified deep veins of unspecified lower extremity: Secondary | ICD-10-CM

## 2012-04-30 LAB — CBC WITH DIFFERENTIAL (CANCER CENTER ONLY)
EOS%: 6.2 % (ref 0.0–7.0)
Eosinophils Absolute: 0.6 10*3/uL — ABNORMAL HIGH (ref 0.0–0.5)
LYMPH#: 2.4 10*3/uL (ref 0.9–3.3)
MCH: 30.2 pg (ref 28.0–33.4)
MONO%: 15.7 % — ABNORMAL HIGH (ref 0.0–13.0)
NEUT#: 5 10*3/uL (ref 1.5–6.5)
Platelets: 194 10*3/uL (ref 145–400)
RBC: 4.9 10*6/uL (ref 4.20–5.70)

## 2012-04-30 LAB — PROTIME-INR (CHCC SATELLITE): INR: 2.5 (ref 2.0–3.5)

## 2012-05-14 ENCOUNTER — Other Ambulatory Visit (HOSPITAL_BASED_OUTPATIENT_CLINIC_OR_DEPARTMENT_OTHER): Payer: Medicare Other | Admitting: Lab

## 2012-05-14 ENCOUNTER — Ambulatory Visit (HOSPITAL_BASED_OUTPATIENT_CLINIC_OR_DEPARTMENT_OTHER): Payer: Medicare Other | Admitting: Medical

## 2012-05-14 VITALS — BP 156/82 | HR 70 | Temp 98.3°F | Resp 18 | Ht 70.0 in | Wt 193.0 lb

## 2012-05-14 DIAGNOSIS — Z7901 Long term (current) use of anticoagulants: Secondary | ICD-10-CM

## 2012-05-14 DIAGNOSIS — D693 Immune thrombocytopenic purpura: Secondary | ICD-10-CM

## 2012-05-14 DIAGNOSIS — D6859 Other primary thrombophilia: Secondary | ICD-10-CM

## 2012-05-14 DIAGNOSIS — I82409 Acute embolism and thrombosis of unspecified deep veins of unspecified lower extremity: Secondary | ICD-10-CM

## 2012-05-14 DIAGNOSIS — R894 Abnormal immunological findings in specimens from other organs, systems and tissues: Secondary | ICD-10-CM

## 2012-05-14 LAB — CBC WITH DIFFERENTIAL (CANCER CENTER ONLY)
BASO#: 0 10*3/uL (ref 0.0–0.2)
HCT: 46.1 % (ref 38.7–49.9)
HGB: 15.8 g/dL (ref 13.0–17.1)
LYMPH#: 3 10*3/uL (ref 0.9–3.3)
LYMPH%: 31.6 % (ref 14.0–48.0)
MCH: 30.2 pg (ref 28.0–33.4)
MCHC: 34.3 g/dL (ref 32.0–35.9)
NEUT%: 48.2 % (ref 40.0–80.0)
RDW: 14.2 % (ref 11.1–15.7)

## 2012-05-14 LAB — PROTIME-INR (CHCC SATELLITE)
INR: 2.8 (ref 2.0–3.5)
Protime: 33.6 Seconds — ABNORMAL HIGH (ref 10.6–13.4)

## 2012-05-14 MED ORDER — FONDAPARINUX SODIUM 10 MG/0.8ML ~~LOC~~ SOLN: 7.5000 mg | SUBCUTANEOUS | Status: DC

## 2012-05-14 NOTE — Progress Notes (Signed)
Diagnoses: #1.  Refractory immune thrombocytopenia. #2.  Cerebrovascular accident. #3.  Lupus anticoagulant.  Current therapy: #1.  Coumadin to maintain INR between 2-3. #2.  Nplate for platelet count less than 100,000.  Interim history: Mark [name] presents today for an office followup visit.  Overall, he, reports, that he's doing quite well except for an upcoming root canal.  He is currently on Coumadin for hypercoagulable state, with lupus, anticoagulant.  I explained to him that he can to come off the Coumadin 5 days before his procedure, he will then start Arixtra.  He will discontinue the Arixtra, the day before the procedure and then restart her Arixtra and Coumadin the day after his procedure.  He will follow back up with [place] until his Coumadin level is therapeutic.  He still continues to improve slowly from his CVA he had a year ago.  He still has some weakness in his left arm.  He is walking more.  His platelets are 129,000, today.  He will not require an injection.  He is eating good.  He denies any nausea, vomiting, diarrhea, constipation, chest pain, shortness of breath, or cough.  He denies any fevers, chills, or night sweats.  He denies any lower leg swelling.  He denies any obvious, or abnormal bleeding.  He denies any headaches, visual changes, or rashes.  Review of Systems: Constitutional:Negative for malaise/fatigue, fever, chills, weight loss, diaphoresis, activity change, appetite change, and unexpected weight change.  HEENT: Negative for double vision, blurred vision, visual loss, ear pain, tinnitus, congestion, rhinorrhea, epistaxis sore throat or sinus disease, oral pain/lesion, tongue soreness Respiratory: Negative for cough, chest tightness, shortness of breath, wheezing and stridor.  Cardiovascular: Negative for chest pain, palpitations, leg swelling, orthopnea, PND, DOE or claudication Gastrointestinal: Negative for nausea, vomiting, abdominal pain, diarrhea, constipation,  blood in stool, melena, hematochezia, abdominal distention, anal bleeding, rectal pain, anorexia and hematemesis.  Genitourinary: Negative for dysuria, frequency, hematuria,  Musculoskeletal: Negative for myalgias, back pain, joint swelling, arthralgias and gait problem.  Skin: Negative for rash, color change, pallor and wound.  Neurological:. Negative for dizziness/light-headedness, tremors, seizures, syncope, facial asymmetry, speech difficulty, weakness, numbness, headaches and paresthesias.  Hematological: Negative for adenopathy. Does not bruise/bleed easily.  Psychiatric/Behavioral:  Negative for depression, no loss of interest in normal activity or change in sleep pattern.   Physical Exam: This is a pleasant, 67 year old, well-developed, well-nourished, white gentleman, in no obvious distress Vitals: Temperature 98.5 degrees, pulse 70, respirations 18, blood pressure 156/82.  Weight 193 pounds HEENT reveals a normocephalic, atraumatic skull, no scleral icterus, no oral lesions  Neck is supple without any cervical or supraclavicular adenopathy.  Lungs are clear to auscultation bilaterally. There are no wheezes, rales or rhonci Cardiac is regular rate and rhythm with a normal S1 and S2. There are no murmurs, rubs, or bruits.  Abdomen is soft with good bowel sounds, there is no palpable mass. There is no palpable hepatosplenomegaly. There is no palpable fluid wave.  Musculoskeletal no tenderness of the spine, ribs, or hips.  Extremities there are no clubbing, cyanosis, or edema.  Skin no petechia, purpura or ecchymosis Neurologic is nonfocal.  Laboratory Data: White count 9.6, hemoglobin 15.8, hematocrit 46.1, platelets 129,000 INR 2.8  Current Outpatient Prescriptions on File Prior to Visit  Medication Sig Dispense Refill  . alendronate (FOSAMAX) 70 MG tablet Take 70 mg by mouth every 7 (seven) days. Take with a full glass of water on an empty stomach.       Marland Kitchen  amLODipine (NORVASC) 5  MG tablet Take 2.5 mg by mouth daily. TAKES 2.5 DAILY.      . bethanechol (URECHOLINE) 25 MG tablet Take 25 mg by mouth 2 (two) times daily.        . Cholecalciferol (VITAMIN D3) 400 UNITS CAPS Take 400 mg by mouth daily.      Marland Kitchen dexamethasone (DECADRON) 4 MG tablet Take by mouth as needed.      . famotidine (PEPCID) 40 MG tablet Take 40 mg by mouth Daily.      Marland Kitchen FLUoxetine (PROZAC) 20 MG capsule Take 20 mg by mouth daily.       Marland Kitchen HYDROcodone-acetaminophen (NORCO) 10-325 MG per tablet Take 1 tablet by mouth as needed.       . levETIRAcetam (KEPPRA) 500 MG tablet 500 mg. 3 in am and 3 in pm      . Multiple Vitamin (MULTIVITAMIN) capsule Take 1 capsule by mouth daily.        . Multiple Vitamin (VITAMIN E/FOLIC ACID/B-6/B-12 PO) Take by mouth every morning.      . rosuvastatin (CRESTOR) 20 MG tablet Take 1 tablet (20 mg total) by mouth daily.  30 tablet  6  . Tamsulosin HCl (FLOMAX) 0.4 MG CAPS Take 0.4 mg by mouth at bedtime.        . vitamin k 100 MCG tablet Take 100 mcg by mouth daily.       Marland Kitchen warfarin (COUMADIN) 5 MG tablet Take 1 tablet (5 mg total) by mouth daily. Takes 2.5 mg. Every day.  30 tablet  3   Assessment/Plan: This is a pleasant, 67 year old, white gentleman, with the following issues:  #1.  Refractory immune thrombocytopenia.  His platelets are 129,000, today.  He will not require Nplate.  We will continue to check his platelets every 2 weeks.  #2.  Hypercoagulable state, with lupus, anticoagulant.  He is currently on Coumadin and is therapeutic.  Again, I gave him a prescription for the Arixtra since he is having a root canal.  I did give them very detailed instructions on when to start and stop the Coumadin and Arixtra.  We will follow back up with Mark [name] after his root canal procedure.  He was instructed.  Call our office once.  He knows more details about the date of his appointment.  #3.  Followup.  Mark [name].  We'll continue to followup with lab work every 2 weeks to  make sure that he is therapeutic on Coumadin and that his platelet count is not too high or too low.  He will follow back up with [place] for an office visit in 6 weeks.  We will see him back before then should there be questions or concerns.

## 2012-05-28 ENCOUNTER — Other Ambulatory Visit: Payer: Medicare Other | Admitting: Lab

## 2012-05-28 ENCOUNTER — Ambulatory Visit: Payer: Medicare Other

## 2012-06-11 ENCOUNTER — Ambulatory Visit: Payer: Medicare Other

## 2012-06-11 ENCOUNTER — Other Ambulatory Visit (HOSPITAL_BASED_OUTPATIENT_CLINIC_OR_DEPARTMENT_OTHER): Payer: Medicare Other | Admitting: Lab

## 2012-06-11 DIAGNOSIS — D693 Immune thrombocytopenic purpura: Secondary | ICD-10-CM

## 2012-06-11 DIAGNOSIS — D6859 Other primary thrombophilia: Secondary | ICD-10-CM

## 2012-06-11 DIAGNOSIS — I82409 Acute embolism and thrombosis of unspecified deep veins of unspecified lower extremity: Secondary | ICD-10-CM

## 2012-06-11 LAB — CBC WITH DIFFERENTIAL (CANCER CENTER ONLY)
Eosinophils Absolute: 0.4 10*3/uL (ref 0.0–0.5)
HCT: 44.5 % (ref 38.7–49.9)
LYMPH#: 2.5 10*3/uL (ref 0.9–3.3)
LYMPH%: 24.1 % (ref 14.0–48.0)
MCV: 87 fL (ref 82–98)
MONO#: 2 10*3/uL — ABNORMAL HIGH (ref 0.1–0.9)
Platelets: 43 10*3/uL — ABNORMAL LOW (ref 145–400)
RBC: 5.14 10*6/uL (ref 4.20–5.70)
WBC: 10.3 10*3/uL — ABNORMAL HIGH (ref 4.0–10.0)

## 2012-06-11 LAB — PROTIME-INR (CHCC SATELLITE): Protime: 33.6 Seconds — ABNORMAL HIGH (ref 10.6–13.4)

## 2012-06-12 ENCOUNTER — Ambulatory Visit (HOSPITAL_BASED_OUTPATIENT_CLINIC_OR_DEPARTMENT_OTHER): Payer: Medicare Other

## 2012-06-12 VITALS — BP 130/79 | HR 68 | Temp 97.3°F | Resp 20

## 2012-06-12 DIAGNOSIS — D693 Immune thrombocytopenic purpura: Secondary | ICD-10-CM

## 2012-06-12 MED ORDER — ROMIPLOSTIM 250 MCG ~~LOC~~ SOLR
1.0000 ug/kg | Freq: Once | SUBCUTANEOUS | Status: AC
  Administered 2012-06-12: 85 ug via SUBCUTANEOUS
  Filled 2012-06-12: qty 0.17

## 2012-06-12 NOTE — Patient Instructions (Addendum)
Romiplostim injection What is this medicine? ROMIPLOSTIM (roe mi PLOE stim) helps your body make more platelets. This medicine is used to treat low platelets caused by chronic idiopathic thrombocytopenic purpura (ITP). This medicine may be used for other purposes; ask your health care provider or pharmacist if you have questions. What should I tell my health care provider before I take this medicine? They need to know if you have any of these conditions: -cancer or myelodysplastic syndrome -low blood counts, like low white cell, platelet, or red cell counts -take medicines that treat or prevent blood clots -an unusual or allergic reaction to romiplostim, mannitol, other medicines, foods, dyes, or preservatives -pregnant or trying to get pregnant -breast-feeding How should I use this medicine? This medicine is for injection under the skin. It is given by a health care professional in a hospital or clinic setting. A special MedGuide will be given to you before your injection. Read this information carefully each time. Talk to your pediatrician regarding the use of this medicine in children. Special care may be needed. Overdosage: If you think you have taken too much of this medicine contact a poison control center or emergency room at once. NOTE: This medicine is only for you. Do not share this medicine with others. What if I miss a dose? It is important not to miss your dose. Call your doctor or health care professional if you are unable to keep an appointment. What may interact with this medicine? Interactions are not expected. This list may not describe all possible interactions. Give your health care provider a list of all the medicines, herbs, non-prescription drugs, or dietary supplements you use. Also tell them if you smoke, drink alcohol, or use illegal drugs. Some items may interact with your medicine. What should I watch for while using this medicine? Your condition will be monitored  carefully while you are receiving this medicine. Visit your prescriber or health care professional for regular checks on your progress and for the needed blood tests. It is important to keep all appointments. What side effects may I notice from receiving this medicine? Side effects that you should report to your doctor or health care professional as soon as possible: -allergic reactions like skin rash, itching or hives, swelling of the face, lips, or tongue -shortness of breath, chest pain, swelling in a leg -unusual bleeding or bruising Side effects that usually do not require medical attention (report to your doctor or health care professional if they continue or are bothersome): -dizziness -headache -muscle aches -pain in arms and legs -stomach pain -trouble sleeping This list may not describe all possible side effects. Call your doctor for medical advice about side effects. You may report side effects to FDA at 1-800-FDA-1088. Where should I keep my medicine? This drug is given in a hospital or clinic and will not be stored at home. NOTE: This sheet is a summary. It may not cover all possible information. If you have questions about this medicine, talk to your doctor, pharmacist, or health care provider.  2012, [employer]. (11/30/2007 3:13:04 PM) 

## 2012-06-13 ENCOUNTER — Other Ambulatory Visit: Payer: Self-pay | Admitting: Internal Medicine

## 2012-06-22 ENCOUNTER — Telehealth: Payer: Self-pay | Admitting: Hematology & Oncology

## 2012-06-22 ENCOUNTER — Other Ambulatory Visit: Payer: Self-pay | Admitting: Medical

## 2012-06-22 DIAGNOSIS — D693 Immune thrombocytopenic purpura: Secondary | ICD-10-CM

## 2012-06-22 NOTE — Telephone Encounter (Signed)
Patient's wife called and cx 06/24/12 and resch for 06/23/12

## 2012-06-23 ENCOUNTER — Ambulatory Visit (HOSPITAL_BASED_OUTPATIENT_CLINIC_OR_DEPARTMENT_OTHER): Payer: Medicare Other | Admitting: Medical

## 2012-06-23 ENCOUNTER — Ambulatory Visit (HOSPITAL_BASED_OUTPATIENT_CLINIC_OR_DEPARTMENT_OTHER): Payer: Medicare Other

## 2012-06-23 ENCOUNTER — Other Ambulatory Visit (HOSPITAL_BASED_OUTPATIENT_CLINIC_OR_DEPARTMENT_OTHER): Payer: Medicare Other | Admitting: Lab

## 2012-06-23 DIAGNOSIS — Z7901 Long term (current) use of anticoagulants: Secondary | ICD-10-CM

## 2012-06-23 DIAGNOSIS — D693 Immune thrombocytopenic purpura: Secondary | ICD-10-CM

## 2012-06-23 DIAGNOSIS — D6859 Other primary thrombophilia: Secondary | ICD-10-CM

## 2012-06-23 DIAGNOSIS — Z8673 Personal history of transient ischemic attack (TIA), and cerebral infarction without residual deficits: Secondary | ICD-10-CM

## 2012-06-23 LAB — CBC WITH DIFFERENTIAL (CANCER CENTER ONLY)
BASO#: 0 10*3/uL (ref 0.0–0.2)
EOS%: 6.1 % (ref 0.0–7.0)
Eosinophils Absolute: 0.7 10*3/uL — ABNORMAL HIGH (ref 0.0–0.5)
HCT: 41.9 % (ref 38.7–49.9)
HGB: 14.3 g/dL (ref 13.0–17.1)
LYMPH%: 21 % (ref 14.0–48.0)
MCH: 30.1 pg (ref 28.0–33.4)
MCHC: 34.1 g/dL (ref 32.0–35.9)
MCV: 88 fL (ref 82–98)
MONO%: 16.2 % — ABNORMAL HIGH (ref 0.0–13.0)
NEUT%: 56.5 % (ref 40.0–80.0)

## 2012-06-23 NOTE — Progress Notes (Signed)
No injection needed per Eunice Blase P.A

## 2012-06-23 NOTE — Progress Notes (Signed)
Diagnoses: #1.  Refractory immune thrombocytopenia. #2.  Cerebrovascular accident. #3.  Lupus anticoagulant.  Current therapy: #1.  Coumadin to maintain INR between 2-3. #2.  Nplate for platelet count less than 100,000.  Interim history: Mr. Sayegh presents today for an office followup visit   his wife accompanies him.  A week ago.  His platelet count was 43,000.  He did receive  Nplate.  He, reports, that he did have 2 nose bleeds.  During that time.  He took 40 mg of dexamethasone for 3 days.  He, reports, that the nose bleeds discontinued.  He does report some fatigue.  He states, that he noticed this when he was placed on Zegerid, and Dexilant from his gastroenterologist.  Apparently, he was having severe, belching and hiccuping.  He, no longer has this issue.  I advised him to discontinue the Zegrid and Dexilant, and let his gastroenterologist know.  His blood pressure has also been running on the lower side.  He is on Norvasc.  I did advise him to continue to monitor his blood pressure and if it is low, not to take the Norvasc.  His platelets are 613,000, today. He will not require Nplate.   He is eating good.  He denies any nausea, vomiting, diarrhea, constipation, chest pain, shortness of breath, or cough.  He denies any fevers, chills, or night sweats.  He denies any lower leg swelling.  He denies any obvious, or abnormal bleeding.  He denies any headaches, visual changes, or rashes.  Review of Systems: Constitutional:Negative for malaise/fatigue, fever, chills, weight loss, diaphoresis, activity change, appetite change, and unexpected weight change.  HEENT: Negative for double vision, blurred vision, visual loss, ear pain, tinnitus, congestion, rhinorrhea, epistaxis sore throat or sinus disease, oral pain/lesion, tongue soreness Respiratory: Negative for cough, chest tightness, shortness of breath, wheezing and stridor.  Cardiovascular: Negative for chest pain, palpitations, leg swelling,  orthopnea, PND, DOE or claudication Gastrointestinal: Negative for nausea, vomiting, abdominal pain, diarrhea, constipation, blood in stool, melena, hematochezia, abdominal distention, anal bleeding, rectal pain, anorexia and hematemesis.  Genitourinary: Negative for dysuria, frequency, hematuria,  Musculoskeletal: Negative for myalgias, back pain, joint swelling, arthralgias and gait problem.  Skin: Negative for rash, color change, pallor and wound.  Neurological:. Negative for dizziness/light-headedness, tremors, seizures, syncope, facial asymmetry, speech difficulty, weakness, numbness, headaches and paresthesias.  Hematological: Negative for adenopathy. Does not bruise/bleed easily.  Psychiatric/Behavioral:  Negative for depression, no loss of interest in normal activity or change in sleep pattern.   Physical Exam: This is a pleasant, 67 year old, well-developed, well-nourished, white gentleman, in no obvious distress Vitals: Temperature 98.1 degrees, pulse 67, respirations 18, blood pressure 102/58, weight 193 pounds HEENT reveals a normocephalic, atraumatic skull, no scleral icterus, no oral lesions  Neck is supple without any cervical or supraclavicular adenopathy.  Lungs are clear to auscultation bilaterally. There are no wheezes, rales or rhonci Cardiac is regular rate and rhythm with a normal S1 and S2. There are no murmurs, rubs, or bruits.  Abdomen is soft with good bowel sounds, there is no palpable mass. There is no palpable hepatosplenomegaly. There is no palpable fluid wave.  Musculoskeletal no tenderness of the spine, ribs, or hips.  Extremities there are no clubbing, cyanosis, or edema.  Skin no petechia, purpura or ecchymosis Neurologic is nonfocal.  Laboratory Data: White count 12.1, hemoglobin 14.3, hematocrit 41.9, platelets 613,000   Current Outpatient Prescriptions on File Prior to Visit  Medication Sig Dispense Refill  . alendronate (FOSAMAX) 70 MG  tablet Take 70  mg by mouth every 7 (seven) days. Take with a full glass of water on an empty stomach.       Marland Kitchen amLODipine (NORVASC) 5 MG tablet Take 2.5 mg by mouth daily. TAKES 2.5 DAILY.      . bethanechol (URECHOLINE) 25 MG tablet Take 25 mg by mouth 2 (two) times daily.        . Cholecalciferol (VITAMIN D3) 400 UNITS CAPS Take 400 mg by mouth daily.      Marland Kitchen dexamethasone (DECADRON) 4 MG tablet Take by mouth as needed.      . famotidine (PEPCID) 40 MG tablet Take 40 mg by mouth Daily.      Marland Kitchen FLUoxetine (PROZAC) 20 MG capsule Take 20 mg by mouth daily.       Marland Kitchen HYDROcodone-acetaminophen (NORCO) 10-325 MG per tablet Take 1 tablet by mouth as needed.       . levETIRAcetam (KEPPRA) 500 MG tablet 500 mg. 3 in am and 3 in pm      . Multiple Vitamin (MULTIVITAMIN) capsule Take 1 capsule by mouth daily.        . Multiple Vitamin (VITAMIN E/FOLIC ACID/B-6/B-12 PO) Take by mouth every morning.      . rosuvastatin (CRESTOR) 20 MG tablet Take 1 tablet (20 mg total) by mouth daily.  30 tablet  6  . Tamsulosin HCl (FLOMAX) 0.4 MG CAPS Take 0.4 mg by mouth at bedtime.        . vitamin k 100 MCG tablet Take 100 mcg by mouth daily.       Marland Kitchen warfarin (COUMADIN) 5 MG tablet Take 1 tablet (5 mg total) by mouth daily. Takes 2.5 mg. Every day.  30 tablet  3   Assessment/Plan: This is a pleasant, 67 year old, white gentleman, with the following issues:  #1.  Refractory immune thrombocytopenia.  His platelets are 613,000, today.  He will not require Nplate.  We will continue to check his platelets every 2 weeks.  #2.  Hypercoagulable state, with lupus, anticoagulant. his INR is 4.1.   According to Mr. Colledge wife, she forgot to get him his vitamin K.  We will continue to monitor his INR every 2 weeks.    #3.  Followup.  Mr. Shorb.  We'll continue to followup with lab work every 2 weeks to make sure that he is therapeutic on Coumadin and that his platelet count is not too high or too low.  He will follow back up with [place] for an  office visit in 6 weeks.  We will see him back before then should there be questions or concerns.

## 2012-06-24 ENCOUNTER — Ambulatory Visit: Payer: Medicare Other

## 2012-06-24 ENCOUNTER — Other Ambulatory Visit: Payer: Medicare Other | Admitting: Lab

## 2012-06-24 ENCOUNTER — Ambulatory Visit: Payer: Medicare Other | Admitting: Medical

## 2012-06-29 ENCOUNTER — Other Ambulatory Visit: Payer: Self-pay | Admitting: *Deleted

## 2012-06-29 DIAGNOSIS — I639 Cerebral infarction, unspecified: Secondary | ICD-10-CM

## 2012-06-29 MED ORDER — ROSUVASTATIN CALCIUM 20 MG PO TABS: 20.0000 mg | ORAL_TABLET | Freq: Every day | ORAL | Status: DC

## 2012-07-09 ENCOUNTER — Ambulatory Visit (HOSPITAL_BASED_OUTPATIENT_CLINIC_OR_DEPARTMENT_OTHER): Payer: Medicare Other

## 2012-07-09 ENCOUNTER — Other Ambulatory Visit (HOSPITAL_BASED_OUTPATIENT_CLINIC_OR_DEPARTMENT_OTHER): Payer: Medicare Other | Admitting: Lab

## 2012-07-09 VITALS — BP 129/86 | HR 67 | Temp 97.0°F | Resp 20

## 2012-07-09 DIAGNOSIS — D693 Immune thrombocytopenic purpura: Secondary | ICD-10-CM

## 2012-07-09 DIAGNOSIS — I82409 Acute embolism and thrombosis of unspecified deep veins of unspecified lower extremity: Secondary | ICD-10-CM

## 2012-07-09 LAB — CBC WITH DIFFERENTIAL (CANCER CENTER ONLY)
BASO#: 0.1 10e3/uL (ref 0.0–0.2)
BASO%: 0.6 % (ref 0.0–2.0)
EOS%: 8.3 % — ABNORMAL HIGH (ref 0.0–7.0)
Eosinophils Absolute: 0.7 10e3/uL — ABNORMAL HIGH (ref 0.0–0.5)
HCT: 41.3 % (ref 38.7–49.9)
HGB: 14.2 g/dL (ref 13.0–17.1)
LYMPH#: 2.4 10e3/uL (ref 0.9–3.3)
LYMPH%: 29.5 % (ref 14.0–48.0)
MCH: 30 pg (ref 28.0–33.4)
MCHC: 34.4 g/dL (ref 32.0–35.9)
MCV: 87 fL (ref 82–98)
MONO#: 1.5 10e3/uL — ABNORMAL HIGH (ref 0.1–0.9)
MONO%: 19.1 % — ABNORMAL HIGH (ref 0.0–13.0)
NEUT#: 3.4 10e3/uL (ref 1.5–6.5)
NEUT%: 42.5 % (ref 40.0–80.0)
Platelets: 67 10e3/uL — ABNORMAL LOW (ref 145–400)
RBC: 4.74 10e6/uL (ref 4.20–5.70)
RDW: 14.5 % (ref 11.1–15.7)
WBC: 8.1 10e3/uL (ref 4.0–10.0)

## 2012-07-09 LAB — PROTIME-INR (CHCC SATELLITE)

## 2012-07-09 MED ORDER — ROMIPLOSTIM 250 MCG ~~LOC~~ SOLR
0.3300 ug/kg | Freq: Once | SUBCUTANEOUS | Status: AC
  Administered 2012-07-09: 30 ug via SUBCUTANEOUS
  Filled 2012-07-09: qty 0.06

## 2012-07-09 NOTE — Patient Instructions (Signed)
Romiplostim injection What is this medicine? ROMIPLOSTIM (roe mi PLOE stim) helps your body make more platelets. This medicine is used to treat low platelets caused by chronic idiopathic thrombocytopenic purpura (ITP). This medicine may be used for other purposes; ask your health care provider or pharmacist if you have questions. What should I tell my health care provider before I take this medicine? They need to know if you have any of these conditions: -cancer or myelodysplastic syndrome -low blood counts, like low white cell, platelet, or red cell counts -take medicines that treat or prevent blood clots -an unusual or allergic reaction to romiplostim, mannitol, other medicines, foods, dyes, or preservatives -pregnant or trying to get pregnant -breast-feeding How should I use this medicine? This medicine is for injection under the skin. It is given by a health care professional in a hospital or clinic setting. A special MedGuide will be given to you before your injection. Read this information carefully each time. Talk to your pediatrician regarding the use of this medicine in children. Special care may be needed. Overdosage: If you think you have taken too much of this medicine contact a poison control center or emergency room at once. NOTE: This medicine is only for you. Do not share this medicine with others. What if I miss a dose? It is important not to miss your dose. Call your doctor or health care professional if you are unable to keep an appointment. What may interact with this medicine? Interactions are not expected. This list may not describe all possible interactions. Give your health care provider a list of all the medicines, herbs, non-prescription drugs, or dietary supplements you use. Also tell them if you smoke, drink alcohol, or use illegal drugs. Some items may interact with your medicine. What should I watch for while using this medicine? Your condition will be monitored  carefully while you are receiving this medicine. Visit your prescriber or health care professional for regular checks on your progress and for the needed blood tests. It is important to keep all appointments. What side effects may I notice from receiving this medicine? Side effects that you should report to your doctor or health care professional as soon as possible: -allergic reactions like skin rash, itching or hives, swelling of the face, lips, or tongue -shortness of breath, chest pain, swelling in a leg -unusual bleeding or bruising Side effects that usually do not require medical attention (report to your doctor or health care professional if they continue or are bothersome): -dizziness -headache -muscle aches -pain in arms and legs -stomach pain -trouble sleeping This list may not describe all possible side effects. Call your doctor for medical advice about side effects. You may report side effects to FDA at 1-800-FDA-1088. Where should I keep my medicine? This drug is given in a hospital or clinic and will not be stored at home. NOTE: This sheet is a summary. It may not cover all possible information. If you have questions about this medicine, talk to your doctor, pharmacist, or health care provider.  2013, Elsevier/Gold Standard. (11/30/2007 3:13:04 PM)  

## 2012-07-23 ENCOUNTER — Other Ambulatory Visit (HOSPITAL_BASED_OUTPATIENT_CLINIC_OR_DEPARTMENT_OTHER): Payer: Medicare Other | Admitting: Lab

## 2012-07-23 ENCOUNTER — Ambulatory Visit: Payer: Medicare Other

## 2012-07-23 DIAGNOSIS — D693 Immune thrombocytopenic purpura: Secondary | ICD-10-CM

## 2012-07-23 DIAGNOSIS — D6859 Other primary thrombophilia: Secondary | ICD-10-CM

## 2012-07-23 LAB — CBC WITH DIFFERENTIAL (CANCER CENTER ONLY)
BASO%: 0.4 % (ref 0.0–2.0)
EOS%: 4 % (ref 0.0–7.0)
HCT: 43 % (ref 38.7–49.9)
LYMPH%: 28.7 % (ref 14.0–48.0)
MCH: 29.8 pg (ref 28.0–33.4)
MCHC: 34 g/dL (ref 32.0–35.9)
MCV: 88 fL (ref 82–98)
NEUT%: 49 % (ref 40.0–80.0)
RDW: 15.5 % (ref 11.1–15.7)

## 2012-07-23 NOTE — Progress Notes (Unsigned)
No n-Plate injection required. Plt count 412.

## 2012-08-06 ENCOUNTER — Ambulatory Visit (HOSPITAL_BASED_OUTPATIENT_CLINIC_OR_DEPARTMENT_OTHER): Payer: Medicare Other

## 2012-08-06 ENCOUNTER — Ambulatory Visit (HOSPITAL_BASED_OUTPATIENT_CLINIC_OR_DEPARTMENT_OTHER): Payer: Medicare Other | Admitting: Hematology & Oncology

## 2012-08-06 ENCOUNTER — Other Ambulatory Visit (HOSPITAL_BASED_OUTPATIENT_CLINIC_OR_DEPARTMENT_OTHER): Payer: Medicare Other | Admitting: Lab

## 2012-08-06 VITALS — BP 117/60 | HR 68 | Temp 98.7°F | Resp 18 | Ht 70.0 in | Wt 195.0 lb

## 2012-08-06 DIAGNOSIS — D693 Immune thrombocytopenic purpura: Secondary | ICD-10-CM

## 2012-08-06 DIAGNOSIS — D6859 Other primary thrombophilia: Secondary | ICD-10-CM

## 2012-08-06 DIAGNOSIS — I82401 Acute embolism and thrombosis of unspecified deep veins of right lower extremity: Secondary | ICD-10-CM

## 2012-08-06 LAB — CBC WITH DIFFERENTIAL (CANCER CENTER ONLY)
Eosinophils Absolute: 0.6 10*3/uL — ABNORMAL HIGH (ref 0.0–0.5)
HCT: 44.3 % (ref 38.7–49.9)
LYMPH#: 2.2 10*3/uL (ref 0.9–3.3)
LYMPH%: 28.5 % (ref 14.0–48.0)
MCV: 89 fL (ref 82–98)
MONO#: 1.7 10*3/uL — ABNORMAL HIGH (ref 0.1–0.9)
NEUT%: 42 % (ref 40.0–80.0)
Platelets: 46 10*3/uL — ABNORMAL LOW (ref 145–400)
RBC: 4.98 10*6/uL (ref 4.20–5.70)
WBC: 7.8 10*3/uL (ref 4.0–10.0)

## 2012-08-06 MED ORDER — ROMIPLOSTIM 250 MCG ~~LOC~~ SOLR
0.3300 ug/kg | Freq: Once | SUBCUTANEOUS | Status: AC
  Administered 2012-08-06: 30 ug via SUBCUTANEOUS
  Filled 2012-08-06: qty 0.06

## 2012-08-06 NOTE — Patient Instructions (Addendum)

## 2012-08-06 NOTE — Progress Notes (Signed)
This office note has been dictated.

## 2012-08-07 NOTE — Progress Notes (Signed)
DIAGNOSES: 1. Refractory immune thrombocytopenia. 2. Lupus anticoagulant positive. 3. Cerebrovascular accident.  CURRENT THERAPY: 1. Coumadin to maintain an INR between 2-3. 2. Nplate to maintain platelet count above 100,000.  INTERIM HISTORY:  Mark [name] comes in for his followup.  He actually is looking quite good.  He has had no problems with respect to bleeding or bruising.  His wife says that she has noticed some petechiae on his legs.  He is still doing therapy for the CVA.  He still has the weakness over on the left side.  Again, he is improving.  He said he did go driving, I think today.  This may have just been around the parking lot.  When he does get Nplate, he does respond quite well.  PHYSICAL EXAMINATION:  General:  This is a well-developed, well- nourished white gentleman in no obvious distress.  Vital signs: Temperature of 98.7, pulse 68, respiratory rate 18, blood pressure 117/60.  Weight is 195.  Head and neck:  Normocephalic, atraumatic skull.  There are no ocular or oral lesions.  There are no palpable cervical or supraclavicular lymph nodes.  Lungs:  Clear bilaterally. Cardiac:  Regular rate and rhythm with a normal S1 and S2.  There are no murmurs, rubs, or bruits.  Abdomen:  Soft with good bowel sounds.  There is no palpable abdominal mass.  There is no palpable hepatosplenomegaly. Extremities:  Continued weakness in the left upper extremity.  He has good strength in his legs.  There may be some slight weakness over on the left leg.  He has no swelling in the extremities.  He has good pulses.  Skin:  Some scattered petechiae on his lower legs.  LABORATORY STUDIES:  White cell count of 7.8, hemoglobin 14.9, hematocrit 44.3, platelet count 46,000.  IMPRESSION:  Mark [name] is a 67 year old gentleman with both a hypercoagulable and a bleeding disorder.  Again, he has refractory immune thrombocytopenia.  This we are controlling pretty well.  He will get  Nplate today.  We check him every week.  We adjust his Coumadin to keep his INR between 2-3.  We will go ahead and give him Nplate today.  I will see him back myself in 6 weeks' time.   ______________________________ Mark [name], M.D. PRE/MEDQ  D:  08/06/2012  T:  08/07/2012  Job:  [id number]

## 2012-08-13 ENCOUNTER — Other Ambulatory Visit: Payer: Medicare Other | Admitting: Lab

## 2012-08-14 ENCOUNTER — Ambulatory Visit (HOSPITAL_BASED_OUTPATIENT_CLINIC_OR_DEPARTMENT_OTHER): Payer: Medicare Other

## 2012-08-14 ENCOUNTER — Ambulatory Visit (HOSPITAL_BASED_OUTPATIENT_CLINIC_OR_DEPARTMENT_OTHER): Payer: Medicare Other | Admitting: Lab

## 2012-08-14 DIAGNOSIS — D6859 Other primary thrombophilia: Secondary | ICD-10-CM

## 2012-08-14 DIAGNOSIS — I82401 Acute embolism and thrombosis of unspecified deep veins of right lower extremity: Secondary | ICD-10-CM

## 2012-08-14 DIAGNOSIS — D693 Immune thrombocytopenic purpura: Secondary | ICD-10-CM

## 2012-08-14 DIAGNOSIS — R112 Nausea with vomiting, unspecified: Secondary | ICD-10-CM

## 2012-08-14 LAB — CBC WITH DIFFERENTIAL (CANCER CENTER ONLY)
BASO#: 0 10*3/uL (ref 0.0–0.2)
BASO%: 0.1 % (ref 0.0–2.0)
EOS%: 3.7 % (ref 0.0–7.0)
Eosinophils Absolute: 0.4 10*3/uL (ref 0.0–0.5)
HCT: 46.2 % (ref 38.7–49.9)
HGB: 15.6 g/dL (ref 13.0–17.1)
LYMPH#: 2.2 10*3/uL (ref 0.9–3.3)
LYMPH%: 19 % (ref 14.0–48.0)
MCH: 30 pg (ref 28.0–33.4)
MCHC: 33.8 g/dL (ref 32.0–35.9)
MCV: 89 fL (ref 82–98)
MONO#: 2 10*3/uL — ABNORMAL HIGH (ref 0.1–0.9)
MONO%: 17 % — ABNORMAL HIGH (ref 0.0–13.0)
NEUT#: 7.1 10*3/uL — ABNORMAL HIGH (ref 1.5–6.5)
NEUT%: 60.2 % (ref 40.0–80.0)
Platelets: 297 10*3/uL (ref 145–400)
RBC: 5.2 10*6/uL (ref 4.20–5.70)
RDW: 15.8 % — ABNORMAL HIGH (ref 11.1–15.7)
WBC: 11.8 10*3/uL — ABNORMAL HIGH (ref 4.0–10.0)

## 2012-08-14 LAB — PROTIME-INR (CHCC SATELLITE): Protime: 39.6 Seconds — ABNORMAL HIGH (ref 10.6–13.4)

## 2012-08-14 MED ORDER — SODIUM CHLORIDE 0.9 % IV SOLN
Freq: Once | INTRAVENOUS | Status: AC
  Administered 2012-08-14: 13:00:00 via INTRAVENOUS

## 2012-08-14 NOTE — Patient Instructions (Addendum)
Dehydration, Adult Dehydration is when you lose more fluids from the body than you take in. Vital organs like the kidneys, brain, and heart cannot function without a proper amount of fluids and salt. Any loss of fluids from the body can cause dehydration.  CAUSES   Vomiting.  Diarrhea.  Excessive sweating.  Excessive urine output.  Fever. SYMPTOMS  Mild dehydration  Thirst.  Dry lips.  Slightly dry mouth. Moderate dehydration  Very dry mouth.  Sunken eyes.  Skin does not bounce back quickly when lightly pinched and released.  Dark urine and decreased urine production.  Decreased tear production.  Headache. Severe dehydration  Very dry mouth.  Extreme thirst.  Rapid, weak pulse (more than 100 beats per minute at rest).  Cold hands and feet.  Not able to sweat in spite of heat and temperature.  Rapid breathing.  Blue lips.  Confusion and lethargy.  Difficulty being awakened.  Minimal urine production.  No tears. DIAGNOSIS  Your caregiver will diagnose dehydration based on your symptoms and your exam. Blood and urine tests will help confirm the diagnosis. The diagnostic evaluation should also identify the cause of dehydration. TREATMENT  Treatment of mild or moderate dehydration can often be done at home by increasing the amount of fluids that you drink. It is best to drink small amounts of fluid more often. Drinking too much at one time can make vomiting worse. Refer to the home care instructions below. Severe dehydration needs to be treated at the hospital where you will probably be given intravenous (IV) fluids that contain water and electrolytes. HOME CARE INSTRUCTIONS   Ask your caregiver about specific rehydration instructions.  Drink enough fluids to keep your urine clear or pale yellow.  Drink small amounts frequently if you have nausea and vomiting.  Eat as you normally do.  Avoid:  Foods or drinks high in sugar.  Carbonated  drinks.  Juice.  Extremely hot or cold fluids.  Drinks with caffeine.  Fatty, greasy foods.  Alcohol.  Tobacco.  Overeating.  Gelatin desserts.  Wash your hands well to avoid spreading bacteria and viruses.  Only take over-the-counter or prescription medicines for pain, discomfort, or fever as directed by your caregiver.  Ask your caregiver if you should continue all prescribed and over-the-counter medicines.  Keep all follow-up appointments with your caregiver. SEEK MEDICAL CARE IF:  You have abdominal pain and it increases or stays in one area (localizes).  You have a rash, stiff neck, or severe headache.  You are irritable, sleepy, or difficult to awaken.  You are weak, dizzy, or extremely thirsty. SEEK IMMEDIATE MEDICAL CARE IF:   You are unable to keep fluids down or you get worse despite treatment.  You have frequent episodes of vomiting or diarrhea.  You have blood or green matter (bile) in your vomit.  You have blood in your stool or your stool looks black and tarry.  You have not urinated in 6 to 8 hours, or you have only urinated a small amount of very dark urine.  You have a fever.  You faint. MAKE SURE YOU:   Understand these instructions.  Will watch your condition.  Will get help right away if you are not doing well or get worse. Document Released: 04/01/2005 Document Revised: 06/24/2011 Document Reviewed: 11/19/2010 ExitCare Patient Information 2013 [employer].  

## 2012-08-20 ENCOUNTER — Other Ambulatory Visit (HOSPITAL_BASED_OUTPATIENT_CLINIC_OR_DEPARTMENT_OTHER): Payer: Medicare Other | Admitting: Lab

## 2012-08-20 DIAGNOSIS — D6859 Other primary thrombophilia: Secondary | ICD-10-CM

## 2012-08-20 DIAGNOSIS — I82401 Acute embolism and thrombosis of unspecified deep veins of right lower extremity: Secondary | ICD-10-CM

## 2012-08-20 DIAGNOSIS — D693 Immune thrombocytopenic purpura: Secondary | ICD-10-CM

## 2012-08-20 LAB — CBC WITH DIFFERENTIAL (CANCER CENTER ONLY)
BASO#: 0 10*3/uL (ref 0.0–0.2)
Eosinophils Absolute: 0.3 10*3/uL (ref 0.0–0.5)
HCT: 42 % (ref 38.7–49.9)
HGB: 13.9 g/dL (ref 13.0–17.1)
LYMPH%: 25.8 % (ref 14.0–48.0)
MCH: 30 pg (ref 28.0–33.4)
MCV: 91 fL (ref 82–98)
MONO%: 13.4 % — ABNORMAL HIGH (ref 0.0–13.0)
NEUT%: 57.1 % (ref 40.0–80.0)
RBC: 4.63 10*6/uL (ref 4.20–5.70)

## 2012-08-27 ENCOUNTER — Telehealth: Payer: Self-pay | Admitting: Hematology & Oncology

## 2012-08-27 ENCOUNTER — Other Ambulatory Visit: Payer: Medicare Other | Admitting: Lab

## 2012-08-27 NOTE — Telephone Encounter (Signed)
Patient called and cx 08/27/12 lab apt and resch for 08/28/12

## 2012-08-28 ENCOUNTER — Other Ambulatory Visit (HOSPITAL_BASED_OUTPATIENT_CLINIC_OR_DEPARTMENT_OTHER): Payer: Medicare Other | Admitting: Lab

## 2012-08-28 DIAGNOSIS — I82401 Acute embolism and thrombosis of unspecified deep veins of right lower extremity: Secondary | ICD-10-CM

## 2012-08-28 DIAGNOSIS — I82409 Acute embolism and thrombosis of unspecified deep veins of unspecified lower extremity: Secondary | ICD-10-CM

## 2012-08-28 LAB — CBC WITH DIFFERENTIAL (CANCER CENTER ONLY)
BASO#: 0 10*3/uL (ref 0.0–0.2)
Eosinophils Absolute: 0.2 10*3/uL (ref 0.0–0.5)
HGB: 14.4 g/dL (ref 13.0–17.1)
LYMPH%: 24.6 % (ref 14.0–48.0)
MCH: 30 pg (ref 28.0–33.4)
MCV: 90 fL (ref 82–98)
MONO#: 1.3 10*3/uL — ABNORMAL HIGH (ref 0.1–0.9)
MONO%: 18.2 % — ABNORMAL HIGH (ref 0.0–13.0)
NEUT#: 3.9 10*3/uL (ref 1.5–6.5)
RBC: 4.8 10*6/uL (ref 4.20–5.70)

## 2012-08-28 LAB — PROTIME-INR (CHCC SATELLITE): Protime: 28.8 Seconds — ABNORMAL HIGH (ref 10.6–13.4)

## 2012-09-03 ENCOUNTER — Other Ambulatory Visit (HOSPITAL_BASED_OUTPATIENT_CLINIC_OR_DEPARTMENT_OTHER): Payer: Medicare Other | Admitting: Lab

## 2012-09-03 DIAGNOSIS — I82401 Acute embolism and thrombosis of unspecified deep veins of right lower extremity: Secondary | ICD-10-CM

## 2012-09-03 DIAGNOSIS — I82409 Acute embolism and thrombosis of unspecified deep veins of unspecified lower extremity: Secondary | ICD-10-CM

## 2012-09-03 LAB — PROTIME-INR (CHCC SATELLITE)
INR: 2.9 (ref 2.0–3.5)
Protime: 34.8 Seconds — ABNORMAL HIGH (ref 10.6–13.4)

## 2012-09-03 LAB — CBC WITH DIFFERENTIAL (CANCER CENTER ONLY)
Eosinophils Absolute: 0.3 10*3/uL (ref 0.0–0.5)
LYMPH#: 2.1 10*3/uL (ref 0.9–3.3)
LYMPH%: 32.3 % (ref 14.0–48.0)
MCV: 89 fL (ref 82–98)
MONO#: 1.3 10*3/uL — ABNORMAL HIGH (ref 0.1–0.9)
NEUT#: 2.8 10*3/uL (ref 1.5–6.5)
Platelets: 55 10*3/uL — ABNORMAL LOW (ref 145–400)
RBC: 4.83 10*6/uL (ref 4.20–5.70)
WBC: 6.6 10*3/uL (ref 4.0–10.0)

## 2012-09-04 ENCOUNTER — Ambulatory Visit (HOSPITAL_BASED_OUTPATIENT_CLINIC_OR_DEPARTMENT_OTHER): Payer: Medicare Other

## 2012-09-04 ENCOUNTER — Telehealth: Payer: Self-pay | Admitting: Oncology

## 2012-09-04 VITALS — BP 116/74 | HR 64 | Temp 97.2°F | Resp 20

## 2012-09-04 DIAGNOSIS — D693 Immune thrombocytopenic purpura: Secondary | ICD-10-CM

## 2012-09-04 MED ORDER — DEXAMETHASONE 4 MG PO TABS: 40.0000 mg | ORAL_TABLET | ORAL | Status: DC

## 2012-09-04 MED ORDER — ROMIPLOSTIM 250 MCG ~~LOC~~ SOLR
0.3300 ug/kg | SUBCUTANEOUS | Status: DC
  Administered 2012-09-04: 30 ug via SUBCUTANEOUS
  Filled 2012-09-04: qty 0.06

## 2012-09-04 NOTE — Telephone Encounter (Addendum)
Message copied by Lacie Draft on Fri Sep 04, 2012 10:54 AM ------      Message from: Arlan Organ R      Created: Thu Sep 03, 2012  5:49 PM       Please call and let him know not to change his Coumadin dose. His INR is perfect. Thanks. Loyal Jacobson with Mrs. Poplaski, INR OK, but needs to come in today for NPLATE d/t platelets. Also sent refill for Dexamethasone.   ------

## 2012-09-04 NOTE — Patient Instructions (Signed)
Romiplostim injection What is this medicine? ROMIPLOSTIM (roe mi PLOE stim) helps your body make more platelets. This medicine is used to treat low platelets caused by chronic idiopathic thrombocytopenic purpura (ITP). This medicine may be used for other purposes; ask your health care provider or pharmacist if you have questions. What should I tell my health care provider before I take this medicine? They need to know if you have any of these conditions: -cancer or myelodysplastic syndrome -low blood counts, like low white cell, platelet, or red cell counts -take medicines that treat or prevent blood clots -an unusual or allergic reaction to romiplostim, mannitol, other medicines, foods, dyes, or preservatives -pregnant or trying to get pregnant -breast-feeding How should I use this medicine? This medicine is for injection under the skin. It is given by a health care professional in a hospital or clinic setting. A special MedGuide will be given to you before your injection. Read this information carefully each time. Talk to your pediatrician regarding the use of this medicine in children. Special care may be needed. Overdosage: If you think you have taken too much of this medicine contact a poison control center or emergency room at once. NOTE: This medicine is only for you. Do not share this medicine with others. What if I miss a dose? It is important not to miss your dose. Call your doctor or health care professional if you are unable to keep an appointment. What may interact with this medicine? Interactions are not expected. This list may not describe all possible interactions. Give your health care provider a list of all the medicines, herbs, non-prescription drugs, or dietary supplements you use. Also tell them if you smoke, drink alcohol, or use illegal drugs. Some items may interact with your medicine. What should I watch for while using this medicine? Your condition will be monitored  carefully while you are receiving this medicine. Visit your prescriber or health care professional for regular checks on your progress and for the needed blood tests. It is important to keep all appointments. What side effects may I notice from receiving this medicine? Side effects that you should report to your doctor or health care professional as soon as possible: -allergic reactions like skin rash, itching or hives, swelling of the face, lips, or tongue -shortness of breath, chest pain, swelling in a leg -unusual bleeding or bruising Side effects that usually do not require medical attention (report to your doctor or health care professional if they continue or are bothersome): -dizziness -headache -muscle aches -pain in arms and legs -stomach pain -trouble sleeping This list may not describe all possible side effects. Call your doctor for medical advice about side effects. You may report side effects to FDA at 1-800-FDA-1088. Where should I keep my medicine? This drug is given in a hospital or clinic and will not be stored at home. NOTE: This sheet is a summary. It may not cover all possible information. If you have questions about this medicine, talk to your doctor, pharmacist, or health care provider.  2013, Elsevier/Gold Standard. (11/30/2007 3:13:04 PM)  

## 2012-09-10 ENCOUNTER — Encounter: Payer: Self-pay | Admitting: Oncology

## 2012-09-10 ENCOUNTER — Other Ambulatory Visit (HOSPITAL_BASED_OUTPATIENT_CLINIC_OR_DEPARTMENT_OTHER): Payer: Medicare Other | Admitting: Lab

## 2012-09-10 DIAGNOSIS — I82401 Acute embolism and thrombosis of unspecified deep veins of right lower extremity: Secondary | ICD-10-CM

## 2012-09-10 DIAGNOSIS — G4730 Sleep apnea, unspecified: Secondary | ICD-10-CM | POA: Insufficient documentation

## 2012-09-10 DIAGNOSIS — D693 Immune thrombocytopenic purpura: Secondary | ICD-10-CM

## 2012-09-10 DIAGNOSIS — Z7901 Long term (current) use of anticoagulants: Secondary | ICD-10-CM

## 2012-09-10 DIAGNOSIS — I2510 Atherosclerotic heart disease of native coronary artery without angina pectoris: Secondary | ICD-10-CM | POA: Insufficient documentation

## 2012-09-10 DIAGNOSIS — D6859 Other primary thrombophilia: Secondary | ICD-10-CM

## 2012-09-10 DIAGNOSIS — Z8673 Personal history of transient ischemic attack (TIA), and cerebral infarction without residual deficits: Secondary | ICD-10-CM

## 2012-09-10 LAB — CBC WITH DIFFERENTIAL (CANCER CENTER ONLY)
Eosinophils Absolute: 0.3 10*3/uL (ref 0.0–0.5)
LYMPH%: 23.9 % (ref 14.0–48.0)
MCV: 91 fL (ref 82–98)
MONO#: 1.4 10*3/uL — ABNORMAL HIGH (ref 0.1–0.9)
NEUT#: 5.5 10*3/uL (ref 1.5–6.5)
Platelets: 179 10*3/uL (ref 145–400)
RBC: 4.65 10*6/uL (ref 4.20–5.70)
WBC: 9.6 10*3/uL (ref 4.0–10.0)

## 2012-09-10 LAB — PROTIME-INR (CHCC SATELLITE)
INR: 3.3 (ref 2.0–3.5)
Protime: 39.6 Seconds — ABNORMAL HIGH (ref 10.6–13.4)

## 2012-09-10 NOTE — Progress Notes (Signed)
Platelets 179, Dr. Myna Hidalgo notified, will hold Nplate.

## 2012-09-17 ENCOUNTER — Ambulatory Visit (HOSPITAL_BASED_OUTPATIENT_CLINIC_OR_DEPARTMENT_OTHER): Payer: Medicare Other | Admitting: Hematology & Oncology

## 2012-09-17 ENCOUNTER — Other Ambulatory Visit (HOSPITAL_BASED_OUTPATIENT_CLINIC_OR_DEPARTMENT_OTHER): Payer: Medicare Other | Admitting: Lab

## 2012-09-17 VITALS — BP 127/69 | HR 67 | Temp 97.9°F | Resp 18 | Wt 187.0 lb

## 2012-09-17 DIAGNOSIS — D693 Immune thrombocytopenic purpura: Secondary | ICD-10-CM

## 2012-09-17 DIAGNOSIS — D6859 Other primary thrombophilia: Secondary | ICD-10-CM

## 2012-09-17 DIAGNOSIS — D6862 Lupus anticoagulant syndrome: Secondary | ICD-10-CM

## 2012-09-17 DIAGNOSIS — I82401 Acute embolism and thrombosis of unspecified deep veins of right lower extremity: Secondary | ICD-10-CM

## 2012-09-17 DIAGNOSIS — Z7901 Long term (current) use of anticoagulants: Secondary | ICD-10-CM

## 2012-09-17 DIAGNOSIS — Z8673 Personal history of transient ischemic attack (TIA), and cerebral infarction without residual deficits: Secondary | ICD-10-CM

## 2012-09-17 DIAGNOSIS — I69359 Hemiplegia and hemiparesis following cerebral infarction affecting unspecified side: Secondary | ICD-10-CM

## 2012-09-17 LAB — CBC WITH DIFFERENTIAL (CANCER CENTER ONLY)
Eosinophils Absolute: 0.2 10*3/uL (ref 0.0–0.5)
MONO#: 1.6 10*3/uL — ABNORMAL HIGH (ref 0.1–0.9)
NEUT#: 3.9 10*3/uL (ref 1.5–6.5)
Platelets: 423 10*3/uL — ABNORMAL HIGH (ref 145–400)
RBC: 4.94 10*6/uL (ref 4.20–5.70)
WBC: 8 10*3/uL (ref 4.0–10.0)

## 2012-09-17 NOTE — Progress Notes (Signed)
This office note has been dictated.

## 2012-09-18 ENCOUNTER — Telehealth: Payer: Self-pay | Admitting: Hematology & Oncology

## 2012-09-18 ENCOUNTER — Other Ambulatory Visit: Payer: Self-pay | Admitting: *Deleted

## 2012-09-18 MED ORDER — ELTROMBOPAG OLAMINE 25 MG PO TABS: 25.0000 mg | ORAL_TABLET | Freq: Every day | ORAL | Status: DC

## 2012-09-18 NOTE — Telephone Encounter (Signed)
Spoke w pt's wife today Karin Golden), in regards to NPLATE [phone] drug being expensive and maybe possibly moving pt a different med. Pt will come in and speak w me on next visit for possible FA programs for the NPLATE drug on needymeds.

## 2012-09-18 NOTE — Progress Notes (Signed)
CC:   [place]. [name], M.D.  DIAGNOSES: 1. Refractory immune thrombocytopenia. 2. Cerebrovascular accident with left-sided weakness. 3. Positive lupus anticoagulant.  CURRENT THERAPY: 1. Coumadin to maintain INR between 2-3. 2. Nplate to maintain platelet count above 100,000.  INTERIM HISTORY:  Mark [name] comes in for his followup.  He is doing okay.  The problem that he and his wife have is paying the Nplate. Apparently, they have amassed quite a bill for Nplate.  There are both on a fixed income.  They were wondering how we can try to help them out.  One possibility is switching the Nplate and putting him on Promacta. Promacta is oral.  We can see how much the Promacta will cost.  Somehow, I would think that there would be some way that the Nplate could potentially be better reimbursed for him.  He has had no bleeding.  He has had continued improvement with his weakness via physical therapy.  There is no change in bowel or bladder habits.  He has had no leg swelling.  He has had no rashes.  He has had no headache.  PHYSICAL EXAMINATION:  General:  This is an a well-developed, well- nourished white gentleman in no obvious distress.  Vital signs: Temperature of 97.9, pulse 67, respiratory rate 18, blood pressure 127/69.  Weight is 197.  Head and neck:  Normocephalic, atraumatic skull.  There are no ocular or oral lesions.  There are no palpable cervical or supraclavicular lymph nodes.  Lungs:  Clear bilaterally. Cardiac:  Regular rate and rhythm with a normal S1 and S2.  There are no murmurs, rubs, or bruits.  Abdomen:  Soft with good bowel sounds.  There is no palpable abdominal mass.  There is no palpable hepatomegaly.  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  Extremities:  Weakness in the left upper extremity.  He has some weakness in the left lower extremity, not as bad.  The right side is intact.  Skin:  No rashes, ecchymosis, or  petechia.  LABORATORY STUDIES:  White cell count is 8, hemoglobin 15, hematocrit 44, platelet count 423.  INR is 2.3.  IMPRESSION:  Mark [name] is a 67 year old gentleman with both a hypercoagulable condition with antiphospholipids with a positive lupus anticoagulant.  He also has a bleeding condition with a refractory immune thrombocytopenia.  We are trying to "balance the 2."  We will see about the Nplate.  If we cannot make any improvement with respect to getting this covered, then we will have to see about Promacta.  We are checking his platelet count and INR weekly.  I want to see him back in about 4 or 5 weeks.    ______________________________ Josph Macho, M.D. PRE/MEDQ  D:  09/17/2012  T:  09/18/2012  Job:  [id number]

## 2012-09-24 ENCOUNTER — Ambulatory Visit (HOSPITAL_BASED_OUTPATIENT_CLINIC_OR_DEPARTMENT_OTHER): Payer: Medicare Other | Admitting: Lab

## 2012-09-24 ENCOUNTER — Ambulatory Visit: Payer: Medicare Other

## 2012-09-24 DIAGNOSIS — I82409 Acute embolism and thrombosis of unspecified deep veins of unspecified lower extremity: Secondary | ICD-10-CM

## 2012-09-24 DIAGNOSIS — I82401 Acute embolism and thrombosis of unspecified deep veins of right lower extremity: Secondary | ICD-10-CM

## 2012-09-24 LAB — CBC WITH DIFFERENTIAL (CANCER CENTER ONLY)
BASO#: 0.1 10*3/uL (ref 0.0–0.2)
EOS%: 2.5 % (ref 0.0–7.0)
Eosinophils Absolute: 0.2 10*3/uL (ref 0.0–0.5)
HCT: 43.9 % (ref 38.7–49.9)
HGB: 14.6 g/dL (ref 13.0–17.1)
LYMPH#: 2.1 10*3/uL (ref 0.9–3.3)
MCH: 30.1 pg (ref 28.0–33.4)
MCHC: 33.3 g/dL (ref 32.0–35.9)
MONO%: 21.3 % — ABNORMAL HIGH (ref 0.0–13.0)
NEUT%: 45.8 % (ref 40.0–80.0)
RBC: 4.85 10*6/uL (ref 4.20–5.70)

## 2012-09-24 LAB — PROTIME-INR (CHCC SATELLITE): INR: 2.4 (ref 2.0–3.5)

## 2012-09-24 NOTE — Progress Notes (Signed)
Per Dr. Myna Hidalgo labs look good. No tx necessary today.

## 2012-09-24 NOTE — Progress Notes (Unsigned)
No need for NPLATE per Dr. Myna Hidalgo. Marland Kitchen

## 2012-10-01 ENCOUNTER — Ambulatory Visit: Payer: Medicare Other

## 2012-10-01 ENCOUNTER — Other Ambulatory Visit (HOSPITAL_BASED_OUTPATIENT_CLINIC_OR_DEPARTMENT_OTHER): Payer: Medicare Other | Admitting: Lab

## 2012-10-01 DIAGNOSIS — I82409 Acute embolism and thrombosis of unspecified deep veins of unspecified lower extremity: Secondary | ICD-10-CM

## 2012-10-01 DIAGNOSIS — I82401 Acute embolism and thrombosis of unspecified deep veins of right lower extremity: Secondary | ICD-10-CM

## 2012-10-01 LAB — CBC WITH DIFFERENTIAL (CANCER CENTER ONLY)
BASO#: 0 10*3/uL (ref 0.0–0.2)
EOS%: 0 % (ref 0.0–7.0)
Eosinophils Absolute: 0 10*3/uL (ref 0.0–0.5)
HCT: 43 % (ref 38.7–49.9)
HGB: 14.9 g/dL (ref 13.0–17.1)
LYMPH#: 1.3 10*3/uL (ref 0.9–3.3)
MCHC: 34.7 g/dL (ref 32.0–35.9)
NEUT#: 26.9 10*3/uL — ABNORMAL HIGH (ref 1.5–6.5)
NEUT%: 86.2 % — ABNORMAL HIGH (ref 40.0–80.0)
RBC: 4.9 10*6/uL (ref 4.20–5.70)

## 2012-10-01 LAB — PROTIME-INR (CHCC SATELLITE): INR: 3.3 (ref 2.0–3.5)

## 2012-10-08 ENCOUNTER — Ambulatory Visit: Payer: Medicare Other

## 2012-10-08 ENCOUNTER — Other Ambulatory Visit (HOSPITAL_BASED_OUTPATIENT_CLINIC_OR_DEPARTMENT_OTHER): Payer: Medicare Other

## 2012-10-08 ENCOUNTER — Other Ambulatory Visit: Payer: Medicare Other | Admitting: Lab

## 2012-10-08 DIAGNOSIS — I82401 Acute embolism and thrombosis of unspecified deep veins of right lower extremity: Secondary | ICD-10-CM

## 2012-10-08 DIAGNOSIS — D693 Immune thrombocytopenic purpura: Secondary | ICD-10-CM

## 2012-10-08 DIAGNOSIS — I82409 Acute embolism and thrombosis of unspecified deep veins of unspecified lower extremity: Secondary | ICD-10-CM

## 2012-10-08 LAB — CBC WITH DIFFERENTIAL (CANCER CENTER ONLY)
BASO#: 0 10*3/uL (ref 0.0–0.2)
EOS%: 5.1 % (ref 0.0–7.0)
MCH: 30.2 pg (ref 28.0–33.4)
MCHC: 33.7 g/dL (ref 32.0–35.9)
MONO%: 12.2 % (ref 0.0–13.0)
NEUT#: 8.9 10*3/uL — ABNORMAL HIGH (ref 1.5–6.5)
Platelets: 357 10*3/uL (ref 145–400)

## 2012-10-08 LAB — PROTIME-INR (CHCC SATELLITE)
INR: 4 — ABNORMAL HIGH (ref 2.0–3.5)
Protime: 48 Seconds — ABNORMAL HIGH (ref 10.6–13.4)

## 2012-10-08 NOTE — Progress Notes (Signed)
Per Dr. Myna Hidalgo pt's pltz count has recovered with the steroids. Informed pt that he did not need any treatment today and to eat a little more salad for his INR. Both pt and wife verbalized understanding and appreciation.

## 2012-10-09 ENCOUNTER — Telehealth: Payer: Self-pay | Admitting: Hematology & Oncology

## 2012-10-09 NOTE — Telephone Encounter (Signed)
[employer] has bee assigned a case for the NPLATE assistance.   Case Number:  [id number]   [employer] [address] [address] [place], [place]  [address] Fx: [phone] Ph: [phone] [email or web address]

## 2012-10-15 ENCOUNTER — Ambulatory Visit: Payer: Medicare Other

## 2012-10-15 ENCOUNTER — Other Ambulatory Visit (HOSPITAL_BASED_OUTPATIENT_CLINIC_OR_DEPARTMENT_OTHER): Payer: Medicare Other | Admitting: Lab

## 2012-10-15 DIAGNOSIS — D696 Thrombocytopenia, unspecified: Secondary | ICD-10-CM

## 2012-10-15 DIAGNOSIS — D693 Immune thrombocytopenic purpura: Secondary | ICD-10-CM

## 2012-10-15 DIAGNOSIS — D649 Anemia, unspecified: Secondary | ICD-10-CM

## 2012-10-15 LAB — CBC WITH DIFFERENTIAL (CANCER CENTER ONLY)
BASO%: 0.4 % (ref 0.0–2.0)
EOS%: 4.1 % (ref 0.0–7.0)
Eosinophils Absolute: 0.5 10*3/uL (ref 0.0–0.5)
MCH: 30.2 pg (ref 28.0–33.4)
MONO%: 15.5 % — ABNORMAL HIGH (ref 0.0–13.0)
NEUT#: 6.3 10*3/uL (ref 1.5–6.5)
Platelets: 326 10*3/uL (ref 145–400)
RBC: 4.51 10*6/uL (ref 4.20–5.70)
RDW: 15.2 % (ref 11.1–15.7)

## 2012-10-15 NOTE — Progress Notes (Signed)
Patient presents today to have platelet count checked.  Platelets 326, NPLATE not given. Patient notified.

## 2012-10-21 ENCOUNTER — Telehealth: Payer: Self-pay | Admitting: Hematology & Oncology

## 2012-10-21 NOTE — Telephone Encounter (Signed)
Darlene cb w [employer] to advise no funding is available for NPLATE at this time.   Ph: [phone]   Fx: [phone]

## 2012-10-22 ENCOUNTER — Ambulatory Visit (HOSPITAL_BASED_OUTPATIENT_CLINIC_OR_DEPARTMENT_OTHER): Payer: Medicare Other | Admitting: Lab

## 2012-10-22 ENCOUNTER — Ambulatory Visit: Payer: Medicare Other | Admitting: Hematology & Oncology

## 2012-10-22 ENCOUNTER — Ambulatory Visit (HOSPITAL_BASED_OUTPATIENT_CLINIC_OR_DEPARTMENT_OTHER): Payer: Medicare Other | Admitting: Hematology & Oncology

## 2012-10-22 ENCOUNTER — Ambulatory Visit: Payer: Medicare Other

## 2012-10-22 ENCOUNTER — Other Ambulatory Visit: Payer: Medicare Other | Admitting: Lab

## 2012-10-22 VITALS — BP 123/70 | HR 71 | Temp 99.6°F | Resp 18 | Ht 70.0 in | Wt 179.0 lb

## 2012-10-22 DIAGNOSIS — D693 Immune thrombocytopenic purpura: Secondary | ICD-10-CM

## 2012-10-22 DIAGNOSIS — I82403 Acute embolism and thrombosis of unspecified deep veins of lower extremity, bilateral: Secondary | ICD-10-CM

## 2012-10-22 DIAGNOSIS — I82409 Acute embolism and thrombosis of unspecified deep veins of unspecified lower extremity: Secondary | ICD-10-CM

## 2012-10-22 LAB — CBC WITH DIFFERENTIAL (CANCER CENTER ONLY)
Eosinophils Absolute: 0.6 10*3/uL — ABNORMAL HIGH (ref 0.0–0.5)
LYMPH#: 2 10*3/uL (ref 0.9–3.3)
MCV: 90 fL (ref 82–98)
MONO#: 1.2 10*3/uL — ABNORMAL HIGH (ref 0.1–0.9)
NEUT#: 3.3 10*3/uL (ref 1.5–6.5)
Platelets: 140 10*3/uL — ABNORMAL LOW (ref 145–400)
RBC: 4.84 10*6/uL (ref 4.20–5.70)
WBC: 7.2 10*3/uL (ref 4.0–10.0)

## 2012-10-22 LAB — PROTIME-INR (CHCC SATELLITE)
INR: 3.8 — ABNORMAL HIGH (ref 2.0–3.5)
Protime: 45.6 Seconds — ABNORMAL HIGH (ref 10.6–13.4)

## 2012-10-22 NOTE — Progress Notes (Signed)
This office note has been dictated.

## 2012-10-22 NOTE — Progress Notes (Signed)
Patient presents today to have platelet count checked.  Platelets 140. NPLATE not given. Pt seen by Dr. Myna Hidalgo.

## 2012-10-23 ENCOUNTER — Telehealth: Payer: Self-pay | Admitting: Hematology & Oncology

## 2012-10-23 NOTE — Telephone Encounter (Signed)
Pt aware of all lab and MD appointment from 7-17 to end of august

## 2012-10-26 NOTE — Progress Notes (Signed)
CC:   Mark [name]. Mark [name], M.D.  DIAGNOSES: 1. Refractory immune thrombocytopenia. 2. Lupus, anticoagulant-positive. 3. Cerebrovascular accident with left-sided weakness.  CURRENT THERAPY: 1. Coumadin to maintain INR between 2-3. 2. Pulse Decadron as indicated for platelet count below 100,000.  INTERIM HISTORY:  Mark [name] comes in for his followup.  He is doing pretty well.  His insurance company recently approved for him to get a device that will try to help strengthen his left arm.  This hopefully will make a big difference for him.  We have him on end-plate now.  Unfortunately, this was an insurance issue __________  We are trying to get Promacta for him.  So far, this has not been accomplished.  He does take pulse Decadron whenever his platelet count does get below 100,000.  Decadron works very well for him.  Typically, he does not need a full dose of Decadron to get the desired bump in his platelet count.  Apparently, he has had no problems with bleeding.  He has had no nausea or vomiting.  There has been no change in bowel or bladder habits.  He has had no leg swelling.  PHYSICAL EXAMINATION:  General:  This is a well-developed, well- nourished white gentleman in no obvious distress.  Vital signs: Temperature of 99.6, pulse 71, respiratory rate 18, blood pressure 123/70.  Weight is 197.  Head and neck:  Normocephalic, atraumatic skull.  There are no ocular or oral lesions.  There are no palpable cervical or supraclavicular lymph nodes.  Lungs:  Clear bilaterally. Cardiac:  Regular rate and rhythm with a normal S1 and S2.  There are no murmurs, rubs or bruits.  Abdomen:  Soft with good bowel sounds.  There is no palpable abdominal mass.  There is no fluid wave.  There is no palpable hepatosplenomegaly.  Back:  No tenderness over the spine, ribs, or hips.  Extremities:  Show no clubbing, cyanosis, or edema. Neurological:  Shows no focal neurological deficits.  He does have  the weakness on his left side.  The left arm is weaker than the left leg.  LABORATORY STUDIES:  White cell count is 7.2, hemoglobin 14.7, hematocrit 43.6, platelet count 140.  INR is 3.8.  IMPRESSION:  Mark [name] is a 67 year old gentleman with both a bleeding disorder and a thrombotic disorder.  We are trying to balance all of this.  For now, we will continue to follow him weekly.  Again, if his platelet count gets below 100,000, we will just have him take some Decadron which typically is highly effective for getting his platelet count back up.  I will plan to see him back myself in a month or so.    ______________________________ Josph Macho, M.D. PRE/MEDQ  D:  10/22/2012  T:  10/23/2012  Job:  [id number]

## 2012-10-29 ENCOUNTER — Telehealth: Payer: Self-pay | Admitting: Hematology & Oncology

## 2012-10-29 ENCOUNTER — Other Ambulatory Visit: Payer: Medicare Other | Admitting: Lab

## 2012-10-29 NOTE — Telephone Encounter (Signed)
Patient called and cx 10/29/12 apt and resch for 10/30/12

## 2012-10-30 ENCOUNTER — Other Ambulatory Visit (HOSPITAL_BASED_OUTPATIENT_CLINIC_OR_DEPARTMENT_OTHER): Payer: Medicare Other | Admitting: Lab

## 2012-10-30 DIAGNOSIS — I82409 Acute embolism and thrombosis of unspecified deep veins of unspecified lower extremity: Secondary | ICD-10-CM

## 2012-10-30 DIAGNOSIS — I82403 Acute embolism and thrombosis of unspecified deep veins of lower extremity, bilateral: Secondary | ICD-10-CM

## 2012-10-30 LAB — CBC WITH DIFFERENTIAL (CANCER CENTER ONLY)
EOS%: 10.1 % — ABNORMAL HIGH (ref 0.0–7.0)
Eosinophils Absolute: 0.9 10*3/uL — ABNORMAL HIGH (ref 0.0–0.5)
MCH: 30.5 pg (ref 28.0–33.4)
MCHC: 33.7 g/dL (ref 32.0–35.9)
MONO%: 19.5 % — ABNORMAL HIGH (ref 0.0–13.0)
NEUT#: 3.7 10*3/uL (ref 1.5–6.5)
Platelets: 101 10*3/uL — ABNORMAL LOW (ref 145–400)
RBC: 4.89 10*6/uL (ref 4.20–5.70)

## 2012-10-30 LAB — PROTIME-INR (CHCC SATELLITE)
INR: 3.3 (ref 2.0–3.5)
Protime: 39.6 Seconds — ABNORMAL HIGH (ref 10.6–13.4)

## 2012-11-05 ENCOUNTER — Other Ambulatory Visit (HOSPITAL_BASED_OUTPATIENT_CLINIC_OR_DEPARTMENT_OTHER): Payer: Medicare Other | Admitting: Lab

## 2012-11-05 DIAGNOSIS — I82403 Acute embolism and thrombosis of unspecified deep veins of lower extremity, bilateral: Secondary | ICD-10-CM

## 2012-11-05 DIAGNOSIS — I82409 Acute embolism and thrombosis of unspecified deep veins of unspecified lower extremity: Secondary | ICD-10-CM

## 2012-11-05 LAB — PROTIME-INR (CHCC SATELLITE): INR: 3.8 — ABNORMAL HIGH (ref 2.0–3.5)

## 2012-11-05 LAB — CBC WITH DIFFERENTIAL (CANCER CENTER ONLY)
EOS%: 3.2 % (ref 0.0–7.0)
MCH: 30.6 pg (ref 28.0–33.4)
MCHC: 33.6 g/dL (ref 32.0–35.9)
MONO%: 19.1 % — ABNORMAL HIGH (ref 0.0–13.0)
NEUT#: 5.8 10*3/uL (ref 1.5–6.5)
Platelets: 241 10*3/uL (ref 145–400)

## 2012-11-12 ENCOUNTER — Other Ambulatory Visit: Payer: Self-pay | Admitting: Hematology & Oncology

## 2012-11-12 ENCOUNTER — Other Ambulatory Visit (HOSPITAL_BASED_OUTPATIENT_CLINIC_OR_DEPARTMENT_OTHER): Payer: Medicare Other | Admitting: Lab

## 2012-11-12 DIAGNOSIS — I82403 Acute embolism and thrombosis of unspecified deep veins of lower extremity, bilateral: Secondary | ICD-10-CM

## 2012-11-12 DIAGNOSIS — I82409 Acute embolism and thrombosis of unspecified deep veins of unspecified lower extremity: Secondary | ICD-10-CM

## 2012-11-12 LAB — CBC WITH DIFFERENTIAL (CANCER CENTER ONLY)
BASO#: 0.1 10*3/uL (ref 0.0–0.2)
EOS%: 7.9 % — ABNORMAL HIGH (ref 0.0–7.0)
HCT: 45.4 % (ref 38.7–49.9)
HGB: 15.3 g/dL (ref 13.0–17.1)
MCH: 30.6 pg (ref 28.0–33.4)
MCHC: 33.7 g/dL (ref 32.0–35.9)
MONO%: 17.9 % — ABNORMAL HIGH (ref 0.0–13.0)
NEUT#: 5.5 10*3/uL (ref 1.5–6.5)
NEUT%: 49.2 % (ref 40.0–80.0)

## 2012-11-19 ENCOUNTER — Other Ambulatory Visit (HOSPITAL_BASED_OUTPATIENT_CLINIC_OR_DEPARTMENT_OTHER): Payer: Medicare Other | Admitting: Lab

## 2012-11-19 DIAGNOSIS — I82409 Acute embolism and thrombosis of unspecified deep veins of unspecified lower extremity: Secondary | ICD-10-CM

## 2012-11-19 DIAGNOSIS — I82403 Acute embolism and thrombosis of unspecified deep veins of lower extremity, bilateral: Secondary | ICD-10-CM

## 2012-11-19 LAB — CBC WITH DIFFERENTIAL (CANCER CENTER ONLY)
BASO#: 0 10*3/uL (ref 0.0–0.2)
EOS%: 3.7 % (ref 0.0–7.0)
HCT: 44.1 % (ref 38.7–49.9)
HGB: 14.8 g/dL (ref 13.0–17.1)
LYMPH%: 25.3 % (ref 14.0–48.0)
MCH: 30.3 pg (ref 28.0–33.4)
MCHC: 33.6 g/dL (ref 32.0–35.9)
MCV: 90 fL (ref 82–98)
MONO%: 21.1 % — ABNORMAL HIGH (ref 0.0–13.0)
NEUT%: 49.5 % (ref 40.0–80.0)

## 2012-11-19 LAB — PROTIME-INR (CHCC SATELLITE)
INR: 3.6 — ABNORMAL HIGH (ref 2.0–3.5)
Protime: 43.2 Seconds — ABNORMAL HIGH (ref 10.6–13.4)

## 2012-11-20 ENCOUNTER — Other Ambulatory Visit: Payer: Self-pay | Admitting: Hematology & Oncology

## 2012-11-26 ENCOUNTER — Other Ambulatory Visit (HOSPITAL_BASED_OUTPATIENT_CLINIC_OR_DEPARTMENT_OTHER): Payer: Medicare Other | Admitting: Lab

## 2012-11-26 DIAGNOSIS — I82403 Acute embolism and thrombosis of unspecified deep veins of lower extremity, bilateral: Secondary | ICD-10-CM

## 2012-11-26 DIAGNOSIS — I82409 Acute embolism and thrombosis of unspecified deep veins of unspecified lower extremity: Secondary | ICD-10-CM

## 2012-11-26 LAB — CBC WITH DIFFERENTIAL (CANCER CENTER ONLY)
BASO%: 0.1 % (ref 0.0–2.0)
EOS%: 0 % (ref 0.0–7.0)
LYMPH%: 13.1 % — ABNORMAL LOW (ref 14.0–48.0)
MCH: 30.3 pg (ref 28.0–33.4)
MCV: 88 fL (ref 82–98)
MONO%: 15.9 % — ABNORMAL HIGH (ref 0.0–13.0)
NEUT#: 14.7 10*3/uL — ABNORMAL HIGH (ref 1.5–6.5)
Platelets: 213 10*3/uL (ref 145–400)
RBC: 5.19 10*6/uL (ref 4.20–5.70)
RDW: 14.9 % (ref 11.1–15.7)

## 2012-11-26 LAB — PROTIME-INR (CHCC SATELLITE)

## 2012-11-27 ENCOUNTER — Encounter: Payer: Self-pay | Admitting: *Deleted

## 2012-11-27 NOTE — Progress Notes (Unsigned)
Late note:  On 11/26/12 pt called and told per Dr. Myna Hidalgo if pt has not taken Coumadin on 11/26/12 , and he had not, he was to hold the coumadin for 2 days and then on 8/16 14 alternate 2.5mg  with 1.25mg .  Pt voiced understanding.

## 2012-12-03 ENCOUNTER — Telehealth: Payer: Self-pay | Admitting: Hematology & Oncology

## 2012-12-03 NOTE — Telephone Encounter (Signed)
Pt moved 8-22 to 8-26

## 2012-12-04 ENCOUNTER — Ambulatory Visit: Payer: Medicare Other | Admitting: Hematology & Oncology

## 2012-12-04 ENCOUNTER — Other Ambulatory Visit: Payer: Medicare Other | Admitting: Lab

## 2012-12-08 ENCOUNTER — Telehealth: Payer: Self-pay | Admitting: Hematology & Oncology

## 2012-12-08 ENCOUNTER — Ambulatory Visit: Payer: Medicare Other | Admitting: Hematology & Oncology

## 2012-12-08 ENCOUNTER — Other Ambulatory Visit: Payer: Medicare Other | Admitting: Lab

## 2012-12-08 NOTE — Telephone Encounter (Signed)
Pt's wife called to advised they forgot about appts for today (lab and dr). They wanted to change appt time to 8/28 for lab only and see Dr at a later date.

## 2012-12-10 ENCOUNTER — Other Ambulatory Visit: Payer: Medicare Other | Admitting: Lab

## 2012-12-17 ENCOUNTER — Other Ambulatory Visit: Payer: Self-pay | Admitting: Hematology & Oncology

## 2012-12-17 ENCOUNTER — Ambulatory Visit (HOSPITAL_BASED_OUTPATIENT_CLINIC_OR_DEPARTMENT_OTHER): Payer: Medicare Other | Admitting: Lab

## 2012-12-17 ENCOUNTER — Telehealth: Payer: Self-pay | Admitting: Hematology & Oncology

## 2012-12-17 DIAGNOSIS — I82403 Acute embolism and thrombosis of unspecified deep veins of lower extremity, bilateral: Secondary | ICD-10-CM

## 2012-12-17 DIAGNOSIS — I82409 Acute embolism and thrombosis of unspecified deep veins of unspecified lower extremity: Secondary | ICD-10-CM

## 2012-12-17 LAB — CBC WITH DIFFERENTIAL (CANCER CENTER ONLY)
BASO#: 0 10*3/uL (ref 0.0–0.2)
EOS%: 5.9 % (ref 0.0–7.0)
HCT: 43.7 % (ref 38.7–49.9)
HGB: 14.7 g/dL (ref 13.0–17.1)
LYMPH#: 2.2 10*3/uL (ref 0.9–3.3)
LYMPH%: 22.2 % (ref 14.0–48.0)
MCH: 30.2 pg (ref 28.0–33.4)
MCHC: 33.6 g/dL (ref 32.0–35.9)
MCV: 90 fL (ref 82–98)
MONO%: 20.8 % — ABNORMAL HIGH (ref 0.0–13.0)
NEUT%: 50.8 % (ref 40.0–80.0)
RDW: 15.4 % (ref 11.1–15.7)

## 2012-12-17 NOTE — Telephone Encounter (Signed)
Pt coming in for lab today he is having bruising. RN aware

## 2012-12-24 ENCOUNTER — Ambulatory Visit (HOSPITAL_BASED_OUTPATIENT_CLINIC_OR_DEPARTMENT_OTHER): Payer: Medicare Other | Admitting: Lab

## 2012-12-24 DIAGNOSIS — I82403 Acute embolism and thrombosis of unspecified deep veins of lower extremity, bilateral: Secondary | ICD-10-CM

## 2012-12-24 DIAGNOSIS — I82409 Acute embolism and thrombosis of unspecified deep veins of unspecified lower extremity: Secondary | ICD-10-CM

## 2012-12-24 LAB — CBC WITH DIFFERENTIAL (CANCER CENTER ONLY)
BASO%: 0.1 % (ref 0.0–2.0)
EOS%: 3.2 % (ref 0.0–7.0)
HCT: 45 % (ref 38.7–49.9)
LYMPH%: 18.2 % (ref 14.0–48.0)
MCH: 30.6 pg (ref 28.0–33.4)
MCHC: 33.6 g/dL (ref 32.0–35.9)
MCV: 91 fL (ref 82–98)
NEUT%: 63.7 % (ref 40.0–80.0)
RDW: 15.8 % — ABNORMAL HIGH (ref 11.1–15.7)

## 2012-12-30 ENCOUNTER — Other Ambulatory Visit: Payer: Self-pay | Admitting: Hematology & Oncology

## 2013-01-07 ENCOUNTER — Other Ambulatory Visit (HOSPITAL_BASED_OUTPATIENT_CLINIC_OR_DEPARTMENT_OTHER): Payer: Medicare Other | Admitting: Lab

## 2013-01-07 DIAGNOSIS — I82409 Acute embolism and thrombosis of unspecified deep veins of unspecified lower extremity: Secondary | ICD-10-CM

## 2013-01-07 DIAGNOSIS — I748 Embolism and thrombosis of other arteries: Secondary | ICD-10-CM

## 2013-01-07 DIAGNOSIS — I82403 Acute embolism and thrombosis of unspecified deep veins of lower extremity, bilateral: Secondary | ICD-10-CM

## 2013-01-07 LAB — CBC WITH DIFFERENTIAL (CANCER CENTER ONLY)
BASO#: 0.1 10*3/uL (ref 0.0–0.2)
EOS%: 6.7 % (ref 0.0–7.0)
Eosinophils Absolute: 0.5 10*3/uL (ref 0.0–0.5)
HCT: 41.8 % (ref 38.7–49.9)
HGB: 14.2 g/dL (ref 13.0–17.1)
LYMPH#: 1.8 10*3/uL (ref 0.9–3.3)
MCH: 30.5 pg (ref 28.0–33.4)
MCHC: 34 g/dL (ref 32.0–35.9)
MONO%: 19.3 % — ABNORMAL HIGH (ref 0.0–13.0)
NEUT%: 51 % (ref 40.0–80.0)
RBC: 4.66 10*6/uL (ref 4.20–5.70)

## 2013-01-07 LAB — PROTHROMBIN TIME: Prothrombin Time: 43.2 seconds — ABNORMAL HIGH (ref 11.6–15.2)

## 2013-01-07 LAB — PROTIME-INR (CHCC SATELLITE)

## 2013-01-08 ENCOUNTER — Telehealth: Payer: Self-pay | Admitting: Hematology & Oncology

## 2013-01-08 ENCOUNTER — Other Ambulatory Visit: Payer: Self-pay | Admitting: *Deleted

## 2013-01-08 DIAGNOSIS — I82403 Acute embolism and thrombosis of unspecified deep veins of lower extremity, bilateral: Secondary | ICD-10-CM

## 2013-01-08 DIAGNOSIS — D693 Immune thrombocytopenic purpura: Secondary | ICD-10-CM

## 2013-01-08 DIAGNOSIS — Z7901 Long term (current) use of anticoagulants: Secondary | ICD-10-CM

## 2013-01-08 MED ORDER — WARFARIN SODIUM 2 MG PO TABS: 2.0000 mg | ORAL_TABLET | Freq: Every day | ORAL | Status: DC

## 2013-01-08 MED ORDER — DEXAMETHASONE 4 MG PO TABS: ORAL_TABLET | ORAL | Status: DC

## 2013-01-08 NOTE — Telephone Encounter (Signed)
Pt aware of 10-2 lab

## 2013-01-14 ENCOUNTER — Other Ambulatory Visit: Payer: Self-pay | Admitting: Hematology & Oncology

## 2013-01-14 ENCOUNTER — Other Ambulatory Visit (HOSPITAL_BASED_OUTPATIENT_CLINIC_OR_DEPARTMENT_OTHER): Payer: Medicare Other | Admitting: Lab

## 2013-01-14 DIAGNOSIS — I82409 Acute embolism and thrombosis of unspecified deep veins of unspecified lower extremity: Secondary | ICD-10-CM

## 2013-01-14 LAB — CBC WITH DIFFERENTIAL (CANCER CENTER ONLY)
BASO#: 0 10*3/uL (ref 0.0–0.2)
Eosinophils Absolute: 0.5 10*3/uL (ref 0.0–0.5)
HCT: 42.9 % (ref 38.7–49.9)
HGB: 14.2 g/dL (ref 13.0–17.1)
LYMPH%: 22.8 % (ref 14.0–48.0)
MCH: 30.1 pg (ref 28.0–33.4)
MCV: 91 fL (ref 82–98)
MONO#: 1.8 10*3/uL — ABNORMAL HIGH (ref 0.1–0.9)
MONO%: 19.2 % — ABNORMAL HIGH (ref 0.0–13.0)
NEUT%: 51.9 % (ref 40.0–80.0)
Platelets: 158 10*3/uL (ref 145–400)
RBC: 4.71 10*6/uL (ref 4.20–5.70)
WBC: 9.1 10*3/uL (ref 4.0–10.0)

## 2013-01-14 LAB — PROTIME-INR (CHCC SATELLITE): Protime: 31.2 Seconds — ABNORMAL HIGH (ref 10.6–13.4)

## 2013-01-20 ENCOUNTER — Other Ambulatory Visit: Payer: Self-pay | Admitting: Hematology & Oncology

## 2013-01-21 ENCOUNTER — Telehealth: Payer: Self-pay | Admitting: Hematology & Oncology

## 2013-01-21 ENCOUNTER — Other Ambulatory Visit: Payer: Medicare Other | Admitting: Lab

## 2013-01-21 NOTE — Telephone Encounter (Signed)
Called to reschedule lab appt for today til tmrw.

## 2013-01-22 ENCOUNTER — Other Ambulatory Visit (HOSPITAL_BASED_OUTPATIENT_CLINIC_OR_DEPARTMENT_OTHER): Payer: Medicare Other | Admitting: Lab

## 2013-01-22 DIAGNOSIS — I82409 Acute embolism and thrombosis of unspecified deep veins of unspecified lower extremity: Secondary | ICD-10-CM

## 2013-01-22 LAB — CBC WITH DIFFERENTIAL (CANCER CENTER ONLY)
BASO#: 0 10*3/uL (ref 0.0–0.2)
EOS%: 3.4 % (ref 0.0–7.0)
Eosinophils Absolute: 0.4 10*3/uL (ref 0.0–0.5)
HCT: 44 % (ref 38.7–49.9)
LYMPH%: 24.2 % (ref 14.0–48.0)
MCH: 30 pg (ref 28.0–33.4)
MCV: 89 fL (ref 82–98)
MONO#: 2.5 10*3/uL — ABNORMAL HIGH (ref 0.1–0.9)
MONO%: 22.2 % — ABNORMAL HIGH (ref 0.0–13.0)
NEUT%: 49.9 % (ref 40.0–80.0)
Platelets: 247 10*3/uL (ref 145–400)
RDW: 15.7 % (ref 11.1–15.7)
WBC: 11.2 10*3/uL — ABNORMAL HIGH (ref 4.0–10.0)

## 2013-01-22 LAB — PROTIME-INR (CHCC SATELLITE): Protime: 32.4 Seconds — ABNORMAL HIGH (ref 10.6–13.4)

## 2013-01-28 ENCOUNTER — Telehealth: Payer: Self-pay | Admitting: Hematology & Oncology

## 2013-01-28 ENCOUNTER — Ambulatory Visit (HOSPITAL_BASED_OUTPATIENT_CLINIC_OR_DEPARTMENT_OTHER): Payer: Medicare Other | Admitting: Lab

## 2013-01-28 DIAGNOSIS — I82409 Acute embolism and thrombosis of unspecified deep veins of unspecified lower extremity: Secondary | ICD-10-CM

## 2013-01-28 LAB — CBC WITH DIFFERENTIAL (CANCER CENTER ONLY)
Eosinophils Absolute: 0.3 10*3/uL (ref 0.0–0.5)
HCT: 45.4 % (ref 38.7–49.9)
HGB: 15.3 g/dL (ref 13.0–17.1)
LYMPH%: 24.9 % (ref 14.0–48.0)
MCH: 30.4 pg (ref 28.0–33.4)
MCV: 90 fL (ref 82–98)
MONO#: 2 10*3/uL — ABNORMAL HIGH (ref 0.1–0.9)
MONO%: 19.4 % — ABNORMAL HIGH (ref 0.0–13.0)
NEUT%: 52.1 % (ref 40.0–80.0)
RBC: 5.04 10*6/uL (ref 4.20–5.70)
RDW: 15.4 % (ref 11.1–15.7)
WBC: 10.3 10*3/uL — ABNORMAL HIGH (ref 4.0–10.0)

## 2013-01-28 LAB — PROTIME-INR (CHCC SATELLITE)

## 2013-01-28 NOTE — Telephone Encounter (Signed)
Patient cx 02/04/13 apt and resch for 02/02/13

## 2013-02-02 ENCOUNTER — Ambulatory Visit (HOSPITAL_BASED_OUTPATIENT_CLINIC_OR_DEPARTMENT_OTHER): Payer: Medicare Other | Admitting: Lab

## 2013-02-02 DIAGNOSIS — I82409 Acute embolism and thrombosis of unspecified deep veins of unspecified lower extremity: Secondary | ICD-10-CM

## 2013-02-02 DIAGNOSIS — D693 Immune thrombocytopenic purpura: Secondary | ICD-10-CM

## 2013-02-02 LAB — CBC WITH DIFFERENTIAL (CANCER CENTER ONLY)
Eosinophils Absolute: 0.5 10*3/uL (ref 0.0–0.5)
HCT: 44.6 % (ref 38.7–49.9)
LYMPH#: 2.7 10*3/uL (ref 0.9–3.3)
LYMPH%: 28.5 % (ref 14.0–48.0)
MCV: 89 fL (ref 82–98)
MONO#: 2 10*3/uL — ABNORMAL HIGH (ref 0.1–0.9)
NEUT#: 4.3 10*3/uL (ref 1.5–6.5)
NEUT%: 44.8 % (ref 40.0–80.0)
Platelets: 144 10*3/uL — ABNORMAL LOW (ref 145–400)
RBC: 5.02 10*6/uL (ref 4.20–5.70)
RDW: 15.5 % (ref 11.1–15.7)
WBC: 9.5 10*3/uL (ref 4.0–10.0)

## 2013-02-02 LAB — PROTIME-INR (CHCC SATELLITE)
INR: 3.4 (ref 2.0–3.5)
Protime: 40.8 Seconds — ABNORMAL HIGH (ref 10.6–13.4)

## 2013-02-04 ENCOUNTER — Other Ambulatory Visit: Payer: Medicare Other | Admitting: Lab

## 2013-02-11 ENCOUNTER — Other Ambulatory Visit (HOSPITAL_BASED_OUTPATIENT_CLINIC_OR_DEPARTMENT_OTHER): Payer: Medicare Other | Admitting: Lab

## 2013-02-11 DIAGNOSIS — I82409 Acute embolism and thrombosis of unspecified deep veins of unspecified lower extremity: Secondary | ICD-10-CM

## 2013-02-11 LAB — CBC WITH DIFFERENTIAL (CANCER CENTER ONLY)
BASO%: 0.6 % (ref 0.0–2.0)
EOS%: 4.3 % (ref 0.0–7.0)
Eosinophils Absolute: 0.4 10*3/uL (ref 0.0–0.5)
HCT: 45.1 % (ref 38.7–49.9)
LYMPH#: 2.6 10*3/uL (ref 0.9–3.3)
MONO#: 1.6 10*3/uL — ABNORMAL HIGH (ref 0.1–0.9)
Platelets: 104 10*3/uL — ABNORMAL LOW (ref 145–400)
RDW: 15.3 % (ref 11.1–15.7)
WBC: 8.2 10*3/uL (ref 4.0–10.0)

## 2013-02-11 LAB — PROTIME-INR (CHCC SATELLITE): Protime: 24 Seconds — ABNORMAL HIGH (ref 10.6–13.4)

## 2013-02-18 ENCOUNTER — Other Ambulatory Visit: Payer: Self-pay | Admitting: *Deleted

## 2013-02-18 ENCOUNTER — Other Ambulatory Visit (HOSPITAL_BASED_OUTPATIENT_CLINIC_OR_DEPARTMENT_OTHER): Payer: Medicare Other | Admitting: Lab

## 2013-02-18 DIAGNOSIS — I82409 Acute embolism and thrombosis of unspecified deep veins of unspecified lower extremity: Secondary | ICD-10-CM

## 2013-02-18 DIAGNOSIS — D693 Immune thrombocytopenic purpura: Secondary | ICD-10-CM

## 2013-02-18 LAB — CBC WITH DIFFERENTIAL (CANCER CENTER ONLY)
BASO#: 0 10*3/uL (ref 0.0–0.2)
BASO%: 0.3 % (ref 0.0–2.0)
EOS%: 3.8 % (ref 0.0–7.0)
Eosinophils Absolute: 0.4 10*3/uL (ref 0.0–0.5)
HGB: 15.2 g/dL (ref 13.0–17.1)
LYMPH#: 2.7 10*3/uL (ref 0.9–3.3)
LYMPH%: 25.1 % (ref 14.0–48.0)
MCHC: 33.4 g/dL (ref 32.0–35.9)
MCV: 90 fL (ref 82–98)
MONO#: 1.8 10*3/uL — ABNORMAL HIGH (ref 0.1–0.9)
MONO%: 16.9 % — ABNORMAL HIGH (ref 0.0–13.0)
Platelets: 118 10*3/uL — ABNORMAL LOW (ref 145–400)
RDW: 15.1 % (ref 11.1–15.7)
WBC: 10.8 10*3/uL — ABNORMAL HIGH (ref 4.0–10.0)

## 2013-02-18 LAB — PROTIME-INR (CHCC SATELLITE): Protime: 42 Seconds — ABNORMAL HIGH (ref 10.6–13.4)

## 2013-02-19 ENCOUNTER — Telehealth: Payer: Self-pay | Admitting: *Deleted

## 2013-02-19 DIAGNOSIS — J019 Acute sinusitis, unspecified: Secondary | ICD-10-CM

## 2013-02-19 DIAGNOSIS — J209 Acute bronchitis, unspecified: Secondary | ICD-10-CM

## 2013-02-19 MED ORDER — CEFDINIR 300 MG PO CAPS: 600.0000 mg | ORAL_CAPSULE | Freq: Every day | ORAL | Status: DC

## 2013-02-19 NOTE — Telephone Encounter (Signed)
Pt's wife called stating he has a productive cough of green mucous that she believes started from a sinus infection that is going in his chest. No fever at this time but he normally runs a low temp ~97.1 and is ~98.6 today. She is also wants to know what he can take for his cough. Reviewed with Dr Myna Hidalgo and the on staff pharmacist. To start Omnicef 600 mg po daily x 7 days and Robitussin (regular). She verbalized understanding. He will be in on 02/25/13 for INR check. Request made to have an appt with Dr Myna Hidalgo made in Dec on a Thurs afternoon. Will continue to have weekly labs to check CBC and INR. Scheduling to call pt with appts (or give schedule when comes on the 13th).

## 2013-02-25 ENCOUNTER — Encounter: Payer: Self-pay | Admitting: *Deleted

## 2013-02-25 ENCOUNTER — Other Ambulatory Visit (HOSPITAL_BASED_OUTPATIENT_CLINIC_OR_DEPARTMENT_OTHER): Payer: Medicare Other | Admitting: Lab

## 2013-02-25 DIAGNOSIS — I82409 Acute embolism and thrombosis of unspecified deep veins of unspecified lower extremity: Secondary | ICD-10-CM

## 2013-02-25 LAB — CBC WITH DIFFERENTIAL (CANCER CENTER ONLY)
BASO#: 0.1 10*3/uL (ref 0.0–0.2)
EOS%: 3.5 % (ref 0.0–7.0)
MCH: 29.6 pg (ref 28.0–33.4)
MCHC: 33.4 g/dL (ref 32.0–35.9)
MONO#: 2.5 10*3/uL — ABNORMAL HIGH (ref 0.1–0.9)
MONO%: 26.1 % — ABNORMAL HIGH (ref 0.0–13.0)
NEUT#: 3.7 10*3/uL (ref 1.5–6.5)
Platelets: 147 10*3/uL (ref 145–400)
RDW: 14.9 % (ref 11.1–15.7)

## 2013-02-25 LAB — PROTIME-INR (CHCC SATELLITE)
INR: 3.5 (ref 2.0–3.5)
Protime: 42 Seconds — ABNORMAL HIGH (ref 10.6–13.4)

## 2013-03-04 ENCOUNTER — Ambulatory Visit (HOSPITAL_BASED_OUTPATIENT_CLINIC_OR_DEPARTMENT_OTHER): Payer: Medicare Other

## 2013-03-04 ENCOUNTER — Ambulatory Visit (HOSPITAL_BASED_OUTPATIENT_CLINIC_OR_DEPARTMENT_OTHER): Payer: Medicare Other | Admitting: Lab

## 2013-03-04 VITALS — BP 139/84 | HR 64 | Temp 98.4°F | Resp 20

## 2013-03-04 DIAGNOSIS — D693 Immune thrombocytopenic purpura: Secondary | ICD-10-CM

## 2013-03-04 DIAGNOSIS — I82409 Acute embolism and thrombosis of unspecified deep veins of unspecified lower extremity: Secondary | ICD-10-CM

## 2013-03-04 LAB — CBC WITH DIFFERENTIAL (CANCER CENTER ONLY)
BASO#: 0.1 10*3/uL (ref 0.0–0.2)
EOS%: 4.9 % (ref 0.0–7.0)
Eosinophils Absolute: 0.5 10*3/uL (ref 0.0–0.5)
HGB: 14.9 g/dL (ref 13.0–17.1)
LYMPH%: 33.3 % (ref 14.0–48.0)
MCH: 29.3 pg (ref 28.0–33.4)
MCHC: 33 g/dL (ref 32.0–35.9)
MONO#: 2.5 10*3/uL — ABNORMAL HIGH (ref 0.1–0.9)
MONO%: 25 % — ABNORMAL HIGH (ref 0.0–13.0)
NEUT#: 3.6 10*3/uL (ref 1.5–6.5)
RBC: 5.08 10*6/uL (ref 4.20–5.70)

## 2013-03-04 LAB — PROTIME-INR (CHCC SATELLITE): INR: 4.2 — ABNORMAL HIGH (ref 2.0–3.5)

## 2013-03-04 MED ORDER — ROMIPLOSTIM 250 MCG ~~LOC~~ SOLR
0.3300 ug/kg | SUBCUTANEOUS | Status: DC
  Administered 2013-03-04: 30 ug via SUBCUTANEOUS
  Filled 2013-03-04: qty 0.06

## 2013-03-04 NOTE — Patient Instructions (Signed)
Romiplostim injection What is this medicine? ROMIPLOSTIM (roe mi PLOE stim) helps your body make more platelets. This medicine is used to treat low platelets caused by chronic idiopathic thrombocytopenic purpura (ITP). This medicine may be used for other purposes; ask your health care provider or pharmacist if you have questions. COMMON BRAND NAME(S): Nplate What should I tell my health care provider before I take this medicine? They need to know if you have any of these conditions: -cancer or myelodysplastic syndrome -low blood counts, like low white cell, platelet, or red cell counts -take medicines that treat or prevent blood clots -an unusual or allergic reaction to romiplostim, mannitol, other medicines, foods, dyes, or preservatives -pregnant or trying to get pregnant -breast-feeding How should I use this medicine? This medicine is for injection under the skin. It is given by a health care professional in a hospital or clinic setting. A special MedGuide will be given to you before your injection. Read this information carefully each time. Talk to your pediatrician regarding the use of this medicine in children. Special care may be needed. Overdosage: If you think you have taken too much of this medicine contact a poison control center or emergency room at once. NOTE: This medicine is only for you. Do not share this medicine with others. What if I miss a dose? It is important not to miss your dose. Call your doctor or health care professional if you are unable to keep an appointment. What may interact with this medicine? Interactions are not expected. This list may not describe all possible interactions. Give your health care provider a list of all the medicines, herbs, non-prescription drugs, or dietary supplements you use. Also tell them if you smoke, drink alcohol, or use illegal drugs. Some items may interact with your medicine. What should I watch for while using this  medicine? Your condition will be monitored carefully while you are receiving this medicine. Visit your prescriber or health care professional for regular checks on your progress and for the needed blood tests. It is important to keep all appointments. What side effects may I notice from receiving this medicine? Side effects that you should report to your doctor or health care professional as soon as possible: -allergic reactions like skin rash, itching or hives, swelling of the face, lips, or tongue -shortness of breath, chest pain, swelling in a leg -unusual bleeding or bruising Side effects that usually do not require medical attention (report to your doctor or health care professional if they continue or are bothersome): -dizziness -headache -muscle aches -pain in arms and legs -stomach pain -trouble sleeping This list may not describe all possible side effects. Call your doctor for medical advice about side effects. You may report side effects to FDA at 1-800-FDA-1088. Where should I keep my medicine? This drug is given in a hospital or clinic and will not be stored at home. NOTE: This sheet is a summary. It may not cover all possible information. If you have questions about this medicine, talk to your doctor, pharmacist, or health care provider.  2014, Elsevier/Gold Standard. (2007-11-30 15:13:04)  

## 2013-03-08 ENCOUNTER — Ambulatory Visit (HOSPITAL_COMMUNITY)
Admission: RE | Admit: 2013-03-08 | Discharge: 2013-03-08 | Disposition: A | Discharge status: Home / Self Care | Payer: Medicare Other | Source: Ambulatory Visit | Attending: Family Medicine | Admitting: Family Medicine

## 2013-03-08 ENCOUNTER — Other Ambulatory Visit (HOSPITAL_COMMUNITY): Payer: Self-pay | Admitting: Family Medicine

## 2013-03-08 DIAGNOSIS — M79609 Pain in unspecified limb: Secondary | ICD-10-CM

## 2013-03-08 DIAGNOSIS — R609 Edema, unspecified: Secondary | ICD-10-CM | POA: Insufficient documentation

## 2013-03-08 DIAGNOSIS — M25562 Pain in left knee: Secondary | ICD-10-CM

## 2013-03-08 DIAGNOSIS — M7989 Other specified soft tissue disorders: Secondary | ICD-10-CM

## 2013-03-08 NOTE — Progress Notes (Signed)
VASCULAR LAB PRELIMINARY  PRELIMINARY  PRELIMINARY  PRELIMINARY  Left lower extremity venous duplex completed.    Preliminary report:  Left:  No evidence of DVT, superficial thrombosis, or Baker's cyst.  Clancy Leiner, RVS 03/08/2013, 6:08 PM

## 2013-03-10 ENCOUNTER — Other Ambulatory Visit: Payer: Medicare Other | Admitting: Lab

## 2013-03-10 ENCOUNTER — Telehealth: Payer: Self-pay | Admitting: *Deleted

## 2013-03-10 NOTE — Telephone Encounter (Signed)
Received a call from the pt's wife regarding his lab appt for today. He saw his PCP Dr Cliffton Asters yesterday who ordered a CBC. His platelets are 55k. She wants to know if Dr Myna Hidalgo is ok with using that # or do they still have to come in? Denies any s/s of bleeding, bruising, or headache. Reviewed with Dr Myna Hidalgo. Ok to use lab from yesterday. May uses Decadron rx as prescribed. To keep appt next week as scheduled. They understand to call back or go to the ER if he develops any s/s of bleeding

## 2013-03-15 ENCOUNTER — Encounter: Payer: Self-pay | Admitting: Hematology & Oncology

## 2013-03-18 ENCOUNTER — Ambulatory Visit (HOSPITAL_BASED_OUTPATIENT_CLINIC_OR_DEPARTMENT_OTHER): Payer: Medicare Other | Admitting: Hematology & Oncology

## 2013-03-18 ENCOUNTER — Other Ambulatory Visit (HOSPITAL_BASED_OUTPATIENT_CLINIC_OR_DEPARTMENT_OTHER): Payer: Medicare Other | Admitting: Lab

## 2013-03-18 VITALS — BP 115/70 | HR 70 | Temp 98.6°F | Resp 18

## 2013-03-18 DIAGNOSIS — D6859 Other primary thrombophilia: Secondary | ICD-10-CM

## 2013-03-18 DIAGNOSIS — I82409 Acute embolism and thrombosis of unspecified deep veins of unspecified lower extremity: Secondary | ICD-10-CM

## 2013-03-18 DIAGNOSIS — Z86718 Personal history of other venous thrombosis and embolism: Secondary | ICD-10-CM

## 2013-03-18 DIAGNOSIS — D693 Immune thrombocytopenic purpura: Secondary | ICD-10-CM

## 2013-03-18 DIAGNOSIS — Z8673 Personal history of transient ischemic attack (TIA), and cerebral infarction without residual deficits: Secondary | ICD-10-CM

## 2013-03-18 LAB — CBC WITH DIFFERENTIAL (CANCER CENTER ONLY)
BASO#: 0 10*3/uL (ref 0.0–0.2)
EOS%: 4.8 % (ref 0.0–7.0)
Eosinophils Absolute: 0.5 10*3/uL (ref 0.0–0.5)
HGB: 14.5 g/dL (ref 13.0–17.1)
LYMPH#: 3 10*3/uL (ref 0.9–3.3)
LYMPH%: 28.1 % (ref 14.0–48.0)
MCV: 89 fL (ref 82–98)
NEUT#: 5.4 10*3/uL (ref 1.5–6.5)
RBC: 4.96 10*6/uL (ref 4.20–5.70)
WBC: 10.5 10*3/uL — ABNORMAL HIGH (ref 4.0–10.0)

## 2013-03-18 LAB — PROTIME-INR (CHCC SATELLITE): INR: 3.6 — ABNORMAL HIGH (ref 2.0–3.5)

## 2013-03-18 NOTE — Progress Notes (Signed)
This office note has been dictated.

## 2013-03-25 ENCOUNTER — Other Ambulatory Visit (HOSPITAL_BASED_OUTPATIENT_CLINIC_OR_DEPARTMENT_OTHER): Payer: Medicare Other | Admitting: Lab

## 2013-03-25 DIAGNOSIS — I82409 Acute embolism and thrombosis of unspecified deep veins of unspecified lower extremity: Secondary | ICD-10-CM

## 2013-03-25 LAB — CBC WITH DIFFERENTIAL (CANCER CENTER ONLY)
BASO%: 0.3 % (ref 0.0–2.0)
HCT: 45.4 % (ref 38.7–49.9)
LYMPH#: 2.5 10*3/uL (ref 0.9–3.3)
MCH: 29.2 pg (ref 28.0–33.4)
MCHC: 32.8 g/dL (ref 32.0–35.9)
MONO#: 2 10*3/uL — ABNORMAL HIGH (ref 0.1–0.9)
NEUT#: 5.3 10*3/uL (ref 1.5–6.5)
NEUT%: 52.5 % (ref 40.0–80.0)
RBC: 5.1 10*6/uL (ref 4.20–5.70)
RDW: 15.2 % (ref 11.1–15.7)
WBC: 10.1 10*3/uL — ABNORMAL HIGH (ref 4.0–10.0)

## 2013-03-25 LAB — PROTIME-INR (CHCC SATELLITE)
INR: 3.2 (ref 2.0–3.5)
Protime: 38.4 Seconds — ABNORMAL HIGH (ref 10.6–13.4)

## 2013-03-26 NOTE — Progress Notes (Signed)
DIAGNOSES: 1. Refractory immune thrombocytopenia. 2. 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  CURRENT THERAPY: 1. Coumadin to maintain INR between 2-3. 2. Decadron as indicated for platelet count below 100,000.  INTERIM HISTORY:  Mr. Hyneman comes in for followup.  He is doing okay. He is getting some physical therapy.  He is using a device to try to help strengthen his left arm.  We do not have him on Nplate.  His insurance company is not going to pay for this.  I am not sure as to why there is a problem with this.  Regardless, Decadron does seem to work for him whenever he needs it.  He has had no bleeding.  He has had no further thromboembolic events. He has had no cough or shortness of breath.  He has had no change in bowel or bladder habits.  His blood sugars have been under fairly good control.  They do tend to go up when he has Decadron.  PHYSICAL EXAMINATION:  General:  This is a fairly well-developed, well- nourished white gentleman, in no obvious distress.  Vital Signs: Temperature of 98.6, pulse 70, respiratory rate 16, blood pressure 115/70.  Weight was not taken.  Head and Neck:  Normocephalic, atraumatic skull.  He has no ocular or oral lesions.  Pupils react appropriately.  There is no palpable lymphadenopathy in the neck. Lungs:  Clear bilaterally.  Cardiac:  Regular rate and rhythm with a normal S1, S2.  There are no murmurs, rubs, or bruits.  Abdomen:  Soft. He has good bowel sounds.  There is no fluid wave.  There is no palpable abdominal mass.  There is no palpable hepatosplenomegaly.  Back:  No tenderness over the spine, ribs, or hips.  Extremities:  Weakness on the left side.  There is still more weakness in the left arm than left leg. He is ambulating pretty well.  Neurologic:  There are no tremors.  There are no sensory deficits.  Skin:  No rashes, ecchymosis, or petechia.  LABORATORY STUDIES:  White cell  count is 10.5, hemoglobin 14.5, hematocrit 44.3, platelet count 401.  INR is 3.6.  IMPRESSION:  Mr. Wilmer is a 66 year old gentleman with refractory immune thrombocytopenia.  He also has a hypercoagulable state with a positive lupus anticoagulant.  Because of this, he had a significant cerebrovascular event with residual left-sided weakness.  This happened, I think, probably 2-3 years ago.  He has been working hard to try to get his strength back.  From my point of view, the pulse Decadron works very well for him.  I do not see that we have to see about getting him Promacta.  We will continue to follow his blood counts weekly.  I will plan to get him back to see me in 6 weeks.    ______________________________ Josph Macho, M.D. PRE/MEDQ  D:  03/24/2013  T:  03/25/2013  Job:  [id number]

## 2013-03-30 ENCOUNTER — Telehealth: Payer: Self-pay | Admitting: *Deleted

## 2013-03-30 DIAGNOSIS — D693 Immune thrombocytopenic purpura: Secondary | ICD-10-CM

## 2013-03-30 MED ORDER — DEXAMETHASONE 4 MG PO TABS: ORAL_TABLET | ORAL | Status: DC

## 2013-03-30 NOTE — Telephone Encounter (Addendum)
Pt's wife called stating Mr Bhola started having petechiae and doesn't have any Decadron refills. Returned call at 1030. No answer. Left message asking her to call back ASAP as he will most likely need labs and the lab closes early today.  Spoke to Mrs. Tuzzolino several times today. Mr. Hoard is in [place] for PT and isn't able to come in for labs. Ok to start Decadron but will extend from 3 days to 4 days and check labs on the 19th per Dr Myna Hidalgo.

## 2013-04-01 ENCOUNTER — Other Ambulatory Visit: Payer: Medicare Other | Admitting: Lab

## 2013-04-01 ENCOUNTER — Telehealth: Payer: Self-pay | Admitting: Hematology & Oncology

## 2013-04-01 NOTE — Telephone Encounter (Signed)
Pt moved 12-18 to April 04, 2023 his mom died

## 2013-04-02 ENCOUNTER — Encounter: Payer: Self-pay | Admitting: *Deleted

## 2013-04-02 ENCOUNTER — Other Ambulatory Visit (HOSPITAL_BASED_OUTPATIENT_CLINIC_OR_DEPARTMENT_OTHER): Payer: Medicare Other | Admitting: Lab

## 2013-04-02 ENCOUNTER — Ambulatory Visit (HOSPITAL_BASED_OUTPATIENT_CLINIC_OR_DEPARTMENT_OTHER): Payer: Medicare Other

## 2013-04-02 VITALS — BP 163/88 | HR 67 | Temp 97.7°F | Resp 18

## 2013-04-02 DIAGNOSIS — I82409 Acute embolism and thrombosis of unspecified deep veins of unspecified lower extremity: Secondary | ICD-10-CM

## 2013-04-02 DIAGNOSIS — Z7901 Long term (current) use of anticoagulants: Secondary | ICD-10-CM

## 2013-04-02 DIAGNOSIS — D693 Immune thrombocytopenic purpura: Secondary | ICD-10-CM

## 2013-04-02 LAB — CBC WITH DIFFERENTIAL (CANCER CENTER ONLY)
BASO#: 0 10*3/uL (ref 0.0–0.2)
BASO%: 0 % (ref 0.0–2.0)
EOS%: 0 % (ref 0.0–7.0)
HCT: 41 % (ref 38.7–49.9)
HGB: 13.8 g/dL (ref 13.0–17.1)
LYMPH#: 1.5 10*3/uL (ref 0.9–3.3)
LYMPH%: 6.1 % — ABNORMAL LOW (ref 14.0–48.0)
MCH: 29.4 pg (ref 28.0–33.4)
MCHC: 33.7 g/dL (ref 32.0–35.9)
MONO#: 2.5 10*3/uL — ABNORMAL HIGH (ref 0.1–0.9)
MONO%: 10.6 % (ref 0.0–13.0)
NEUT#: 20 10*3/uL — ABNORMAL HIGH (ref 1.5–6.5)
Platelets: 62 10*3/uL — ABNORMAL LOW (ref 145–400)
RDW: 15.2 % (ref 11.1–15.7)
WBC: 24 10*3/uL — ABNORMAL HIGH (ref 4.0–10.0)

## 2013-04-02 LAB — PROTHROMBIN TIME
INR: 4.47 — ABNORMAL HIGH (ref <1.50)
Prothrombin Time: 41.1 seconds — ABNORMAL HIGH (ref 11.6–15.2)

## 2013-04-02 LAB — PROTIME-INR (CHCC SATELLITE)

## 2013-04-02 MED ORDER — ROMIPLOSTIM 250 MCG ~~LOC~~ SOLR
0.3300 ug/kg | SUBCUTANEOUS | Status: DC
  Administered 2013-04-02: 30 ug via SUBCUTANEOUS
  Filled 2013-04-02: qty 0.06

## 2013-04-02 NOTE — Patient Instructions (Signed)
Thrombocytopenia Thrombocytopenia is a condition in which there is an abnormally small number of platelets in your blood. Platelets are also called thrombocytes. Platelets are needed for blood clotting. CAUSES Thrombocytopenia is caused by:   Decreased production of platelets. This can be caused by:  Aplastic anemia in which your bone marrow quits making blood cells.  Cancer in the bone marrow.  Use of certain medicines, including chemotherapy.  Infection in the bone marrow.  Heavy alcohol consumption.  Increased destruction of platelets. This can be caused by:  Certain immune diseases.  Use of certain drugs.  Certain blood clotting disorders.  Certain inherited disorders.  Certain bleeding disorders.  Pregnancy.  Having an enlarged spleen (hypersplenism). In hypersplenism, the spleen gathers up platelets from circulation. This means the platelets are not available to help with blood clotting. The spleen can enlarge due to cirrhosis or other conditions. SYMPTOMS  The symptoms of thrombocytopenia are side effects of poor blood clotting. Some of these are:  Abnormal bleeding.  Nosebleeds.  Heavy menstrual periods.  Blood in the urine or stools.  Purpura. This is a purplish discoloration in the skin produced by small bleeding vessels near the surface of the skin.  Bruising.  A rash that may be petechial. This looks like pinpoint, purplish-red spots on the skin and mucous membranes. It is caused by bleeding from small blood vessels (capillaries). DIAGNOSIS  Your caregiver will make this diagnosis based on your exam and blood tests. Sometimes, a bone marrow study is done to look for the original cells (megakaryocytes) that make platelets. TREATMENT  Treatment depends on the cause of the condition.  Medicines may be given to help protect your platelets from being destroyed.  In some cases, a replacement (transfusion) of platelets may be required to stop or prevent  bleeding.  Sometimes, the spleen must be surgically removed. HOME CARE INSTRUCTIONS   Check the skin and linings inside your mouth for bruising or bleeding as directed by your caregiver.  Check your sputum, urine, and stool for blood as directed by your caregiver.  Do not return to any activities that could cause bumps or bruises until your caregiver says it is okay.  Take extra care not to cut yourself when shaving or when using scissors, needles, knives, and other tools.  Take extra care not to burn yourself when ironing or cooking.  Ask your caregiver if it is okay for you to drink alcohol.  Only take over-the-counter or prescription medicines as directed by your caregiver.  Notify all your caregivers, including dentists and eye doctors, about your condition. SEEK IMMEDIATE MEDICAL CARE IF:   You develop active bleeding from anywhere in your body.  You develop unexplained bruising or bleeding.  You have blood in your sputum, urine, or stool. MAKE SURE YOU:  Understand these instructions.  Will watch your condition.  Will get help right away if you are not doing well or get worse. Document Released: 04/01/2005 Document Revised: 06/24/2011 Document Reviewed: 02/01/2011 Exit[employer] Patient Information 2014 [employer].  

## 2013-04-09 ENCOUNTER — Other Ambulatory Visit: Payer: Medicare Other | Admitting: Lab

## 2013-04-12 ENCOUNTER — Other Ambulatory Visit (HOSPITAL_BASED_OUTPATIENT_CLINIC_OR_DEPARTMENT_OTHER): Payer: Medicare Other | Admitting: Lab

## 2013-04-12 DIAGNOSIS — I82409 Acute embolism and thrombosis of unspecified deep veins of unspecified lower extremity: Secondary | ICD-10-CM

## 2013-04-12 LAB — PROTIME-INR (CHCC SATELLITE): Protime: 21.6 Seconds — ABNORMAL HIGH (ref 10.6–13.4)

## 2013-04-12 LAB — CBC WITH DIFFERENTIAL (CANCER CENTER ONLY)
BASO#: 0 10*3/uL (ref 0.0–0.2)
BASO%: 0.2 % (ref 0.0–2.0)
EOS%: 4.2 % (ref 0.0–7.0)
HCT: 42.2 % (ref 38.7–49.9)
LYMPH#: 2.5 10*3/uL (ref 0.9–3.3)
MCHC: 32.9 g/dL (ref 32.0–35.9)
MONO#: 2.4 10*3/uL — ABNORMAL HIGH (ref 0.1–0.9)
NEUT#: 5.5 10*3/uL (ref 1.5–6.5)
NEUT%: 50.8 % (ref 40.0–80.0)
Platelets: 366 10*3/uL (ref 145–400)
RBC: 4.72 10*6/uL (ref 4.20–5.70)
RDW: 15.4 % (ref 11.1–15.7)
WBC: 10.9 10*3/uL — ABNORMAL HIGH (ref 4.0–10.0)

## 2013-04-16 ENCOUNTER — Other Ambulatory Visit: Payer: Medicare Other | Admitting: Lab

## 2013-04-22 ENCOUNTER — Other Ambulatory Visit (HOSPITAL_BASED_OUTPATIENT_CLINIC_OR_DEPARTMENT_OTHER): Payer: Medicare Other | Admitting: Lab

## 2013-04-22 ENCOUNTER — Ambulatory Visit (HOSPITAL_BASED_OUTPATIENT_CLINIC_OR_DEPARTMENT_OTHER): Payer: Medicare Other

## 2013-04-22 VITALS — Temp 96.8°F | Resp 20

## 2013-04-22 DIAGNOSIS — I82409 Acute embolism and thrombosis of unspecified deep veins of unspecified lower extremity: Secondary | ICD-10-CM

## 2013-04-22 DIAGNOSIS — D693 Immune thrombocytopenic purpura: Secondary | ICD-10-CM

## 2013-04-22 LAB — CBC WITH DIFFERENTIAL (CANCER CENTER ONLY)
BASO#: 0.1 10*3/uL (ref 0.0–0.2)
BASO%: 0.6 % (ref 0.0–2.0)
EOS ABS: 0.2 10*3/uL (ref 0.0–0.5)
EOS%: 2.3 % (ref 0.0–7.0)
HCT: 45.6 % (ref 38.7–49.9)
HGB: 14.9 g/dL (ref 13.0–17.1)
LYMPH#: 2.4 10*3/uL (ref 0.9–3.3)
LYMPH%: 28.5 % (ref 14.0–48.0)
MCH: 29 pg (ref 28.0–33.4)
MCHC: 32.7 g/dL (ref 32.0–35.9)
MCV: 89 fL (ref 82–98)
MONO#: 1.6 10*3/uL — ABNORMAL HIGH (ref 0.1–0.9)
MONO%: 18.7 % — ABNORMAL HIGH (ref 0.0–13.0)
NEUT%: 49.9 % (ref 40.0–80.0)
NEUTROS ABS: 4.2 10*3/uL (ref 1.5–6.5)
PLATELETS: 69 10*3/uL — AB (ref 145–400)
RBC: 5.14 10*6/uL (ref 4.20–5.70)
RDW: 15 % (ref 11.1–15.7)
WBC: 8.4 10*3/uL (ref 4.0–10.0)

## 2013-04-22 LAB — PROTIME-INR (CHCC SATELLITE)
INR: 2.1 (ref 2.0–3.5)
PROTIME: 25.2 s — AB (ref 10.6–13.4)

## 2013-04-22 MED ORDER — ROMIPLOSTIM 250 MCG ~~LOC~~ SOLR
0.3300 ug/kg | SUBCUTANEOUS | Status: DC
  Administered 2013-04-22: 30 ug via SUBCUTANEOUS
  Filled 2013-04-22: qty 0.06

## 2013-04-22 NOTE — Patient Instructions (Signed)

## 2013-04-23 ENCOUNTER — Telehealth: Payer: Self-pay | Admitting: Hematology & Oncology

## 2013-04-23 NOTE — Telephone Encounter (Signed)
The Safety [employer] Product Replacement Form completed and faxed today to:  F: [phone]  P: [phone]  Pt was approved thru 10/23/2012-04/14/2013 - Case Nbr: [id number]  Nplate dates:   70/26/3785   04/02/2013   04/22/2013 - this date may not be covered  I spoke w the foundation today and they advised, they do not back date and pt was sent a letter to enroll again, before expired date. Pt was given and update application today and advised they are going to call safety net, complete and mail back.

## 2013-04-26 NOTE — Telephone Encounter (Signed)
na

## 2013-04-29 ENCOUNTER — Encounter: Payer: Self-pay | Admitting: Hematology & Oncology

## 2013-04-29 ENCOUNTER — Ambulatory Visit: Payer: Medicare Other

## 2013-04-29 ENCOUNTER — Other Ambulatory Visit (HOSPITAL_BASED_OUTPATIENT_CLINIC_OR_DEPARTMENT_OTHER): Payer: Medicare Other | Admitting: Lab

## 2013-04-29 ENCOUNTER — Ambulatory Visit (HOSPITAL_BASED_OUTPATIENT_CLINIC_OR_DEPARTMENT_OTHER): Payer: Medicare Other | Admitting: Hematology & Oncology

## 2013-04-29 VITALS — BP 151/81 | HR 77 | Temp 98.0°F | Resp 18 | Ht 70.0 in | Wt 209.0 lb

## 2013-04-29 DIAGNOSIS — Z8673 Personal history of transient ischemic attack (TIA), and cerebral infarction without residual deficits: Secondary | ICD-10-CM

## 2013-04-29 DIAGNOSIS — D693 Immune thrombocytopenic purpura: Secondary | ICD-10-CM

## 2013-04-29 DIAGNOSIS — I69359 Hemiplegia and hemiparesis following cerebral infarction affecting unspecified side: Secondary | ICD-10-CM

## 2013-04-29 DIAGNOSIS — D6859 Other primary thrombophilia: Secondary | ICD-10-CM

## 2013-04-29 DIAGNOSIS — I82409 Acute embolism and thrombosis of unspecified deep veins of unspecified lower extremity: Secondary | ICD-10-CM

## 2013-04-29 DIAGNOSIS — Z86718 Personal history of other venous thrombosis and embolism: Secondary | ICD-10-CM

## 2013-04-29 LAB — CBC WITH DIFFERENTIAL (CANCER CENTER ONLY)
BASO#: 0 10*3/uL (ref 0.0–0.2)
BASO%: 0 % (ref 0.0–2.0)
EOS ABS: 0 10*3/uL (ref 0.0–0.5)
EOS%: 0 % (ref 0.0–7.0)
HCT: 43.6 % (ref 38.7–49.9)
HEMOGLOBIN: 14.5 g/dL (ref 13.0–17.1)
LYMPH#: 1.7 10*3/uL (ref 0.9–3.3)
LYMPH%: 8 % — ABNORMAL LOW (ref 14.0–48.0)
MCH: 28.8 pg (ref 28.0–33.4)
MCHC: 33.3 g/dL (ref 32.0–35.9)
MCV: 87 fL (ref 82–98)
MONO#: 1 10*3/uL — AB (ref 0.1–0.9)
MONO%: 4.6 % (ref 0.0–13.0)
NEUT%: 87.4 % — ABNORMAL HIGH (ref 40.0–80.0)
NEUTROS ABS: 18 10*3/uL — AB (ref 1.5–6.5)
PLATELETS: 101 10*3/uL — AB (ref 145–400)
RBC: 5.03 10*6/uL (ref 4.20–5.70)
RDW: 15.6 % (ref 11.1–15.7)
WBC: 20.6 10*3/uL — AB (ref 4.0–10.0)

## 2013-04-29 LAB — PROTIME-INR (CHCC SATELLITE)
INR: 3.7 — AB (ref 2.0–3.5)
Protime: 44.4 Seconds — ABNORMAL HIGH (ref 10.6–13.4)

## 2013-04-29 NOTE — Progress Notes (Signed)
Patient presents today to have platelet count checked.  Pt seen by Dr. Marin Olp. Platelets 101 . NPLATE not given.

## 2013-04-29 NOTE — Progress Notes (Signed)
This office note has been dictated.

## 2013-04-30 NOTE — Progress Notes (Signed)
CC:   [facility]. Dema Severin, M.D.  DIAGNOSES: 1. Refractory immune thrombocytopenia. 2. 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 3. Positive lupus anticoagulant.  CURRENT THERAPY: 1. Coumadin to maintain INR between 2-3 (the patient currently taking     2 mg a day). 2. Nplate as indicated for platelet count less than 100,000. 3. Decadron - the patient to take at his discretion.  INTERIM HISTORY:  Mr. Dorsch comes in for followup.  He is doing fairly well.  He has a new physical therapist.  He goes to [facility] for physical therapy I think once a week.  He also goes to [facility] for [facility].  He is doing fairly well.  He is trying to get some more strength in his left arm.  He has had no problems with bleeding.  His wife noticed some petechia. His platelet count was down to 69,000 a week ago.  He got on some Decadron.  Decadron is easier to take and certainly cost a lot less money and always it works for him.  He has had no change in bowel or bladder habits.  He has had no cough or shortness of breath.  PHYSICAL EXAMINATION:  This is a fairly well-developed, well-nourished white gentleman in no obvious distress.  Vital Signs:  Temperature of 98, pulse 77, respiratory rate 18, blood pressure 151/81, weight is 209 pounds.  Head and Neck:  Normocephalic, atraumatic skull.  There are no ocular or oral lesions.  There are no palpable cervical or supraclavicular lymph nodes.  Lungs:  Clear bilaterally.  Cardiac: Regular rate and rhythm with a normal S1 and S2.  There are no murmurs, rubs, or bruits.  Abdomen:  Soft.  He has good bowel sounds.  There is no fluid wave.  There is no palpable hepatosplenomegaly.  Back:  No tenderness over the spine, ribs, or hips.  Extremities shows weakness in the left arm.  Left leg strength is pretty good.  He has good strength in his right side.  Skin shows some scattered petechia.  LABORATORY STUDIES:   White cell count is 20.6, hemoglobin 14.5, hematocrit 43.6, platelet count 101.  His INR is 3.7.  His peripheral smear does show increase in white cells, but is mostly mature neutrophils.  This is consistent with his steroid use.  He has no nucleated red cells.  Platelets are mildly decreased in number, but well granulated.  IMPRESSION:  Mr. Najarian is a 68 year old gentleman.  He has both refractory immune thrombocytopenia.  He also has lupus anticoagulant that has caused him to have a cerebrovascular accident with some residual left upper extremity weakness.  He has had a deep venous thrombosis in the past.  His platelet count is coming up with steroids.  Again, he was responsible to all the steroids.  His INR is a little on the higher side.  He had been on 2.5 mg.  We will come back to 2 mg a day.  We will continue to follow his blood work weekly.  We really need to follow this weekly and try to maintain a "balance" with hypercoagulability/bleeding.  I will see him back myself in another 6 weeks.    ______________________________ Volanda Napoleon, M.D. PRE/MEDQ  D:  04/29/2013  T:  04/30/2013  Job:  [id number]

## 2013-05-06 ENCOUNTER — Other Ambulatory Visit (HOSPITAL_BASED_OUTPATIENT_CLINIC_OR_DEPARTMENT_OTHER): Payer: Medicare Other | Admitting: Lab

## 2013-05-06 DIAGNOSIS — Z7901 Long term (current) use of anticoagulants: Secondary | ICD-10-CM

## 2013-05-06 DIAGNOSIS — I82409 Acute embolism and thrombosis of unspecified deep veins of unspecified lower extremity: Secondary | ICD-10-CM

## 2013-05-06 DIAGNOSIS — I69359 Hemiplegia and hemiparesis following cerebral infarction affecting unspecified side: Secondary | ICD-10-CM

## 2013-05-06 LAB — CBC WITH DIFFERENTIAL (CANCER CENTER ONLY)
BASO#: 0 10*3/uL (ref 0.0–0.2)
BASO%: 0.1 % (ref 0.0–2.0)
EOS ABS: 0.3 10*3/uL (ref 0.0–0.5)
EOS%: 1.6 % (ref 0.0–7.0)
HEMATOCRIT: 44.7 % (ref 38.7–49.9)
HEMOGLOBIN: 14.8 g/dL (ref 13.0–17.1)
LYMPH#: 3.8 10*3/uL — ABNORMAL HIGH (ref 0.9–3.3)
LYMPH%: 24.5 % (ref 14.0–48.0)
MCH: 29.1 pg (ref 28.0–33.4)
MCHC: 33.1 g/dL (ref 32.0–35.9)
MCV: 88 fL (ref 82–98)
MONO#: 2.7 10*3/uL — ABNORMAL HIGH (ref 0.1–0.9)
MONO%: 17.4 % — ABNORMAL HIGH (ref 0.0–13.0)
NEUT%: 56.4 % (ref 40.0–80.0)
NEUTROS ABS: 8.9 10*3/uL — AB (ref 1.5–6.5)
Platelets: 544 10*3/uL — ABNORMAL HIGH (ref 145–400)
RBC: 5.09 10*6/uL (ref 4.20–5.70)
RDW: 16 % — ABNORMAL HIGH (ref 11.1–15.7)
WBC: 15.7 10*3/uL — ABNORMAL HIGH (ref 4.0–10.0)

## 2013-05-06 LAB — PROTIME-INR (CHCC SATELLITE)
INR: 4 — AB (ref 2.0–3.5)
Protime: 48 Seconds — ABNORMAL HIGH (ref 10.6–13.4)

## 2013-05-06 LAB — TECHNOLOGIST REVIEW CHCC SATELLITE

## 2013-05-13 ENCOUNTER — Other Ambulatory Visit (HOSPITAL_BASED_OUTPATIENT_CLINIC_OR_DEPARTMENT_OTHER): Payer: Medicare Other | Admitting: Lab

## 2013-05-13 DIAGNOSIS — D6859 Other primary thrombophilia: Secondary | ICD-10-CM

## 2013-05-13 DIAGNOSIS — I69359 Hemiplegia and hemiparesis following cerebral infarction affecting unspecified side: Secondary | ICD-10-CM

## 2013-05-13 LAB — CBC WITH DIFFERENTIAL (CANCER CENTER ONLY)
BASO#: 0 10*3/uL (ref 0.0–0.2)
BASO%: 0.3 % (ref 0.0–2.0)
EOS ABS: 0.3 10*3/uL (ref 0.0–0.5)
EOS%: 2.9 % (ref 0.0–7.0)
HCT: 44 % (ref 38.7–49.9)
HGB: 14.5 g/dL (ref 13.0–17.1)
LYMPH#: 2.3 10*3/uL (ref 0.9–3.3)
LYMPH%: 23.3 % (ref 14.0–48.0)
MCH: 28.8 pg (ref 28.0–33.4)
MCHC: 33 g/dL (ref 32.0–35.9)
MCV: 88 fL (ref 82–98)
MONO#: 2.1 10*3/uL — ABNORMAL HIGH (ref 0.1–0.9)
MONO%: 21.9 % — ABNORMAL HIGH (ref 0.0–13.0)
NEUT%: 51.6 % (ref 40.0–80.0)
NEUTROS ABS: 5 10*3/uL (ref 1.5–6.5)
PLATELETS: 304 10*3/uL (ref 145–400)
RBC: 5.03 10*6/uL (ref 4.20–5.70)
RDW: 16.2 % — ABNORMAL HIGH (ref 11.1–15.7)
WBC: 9.8 10*3/uL (ref 4.0–10.0)

## 2013-05-13 LAB — PROTIME-INR (CHCC SATELLITE)
INR: 3 (ref 2.0–3.5)
PROTIME: 36 s — AB (ref 10.6–13.4)

## 2013-05-20 ENCOUNTER — Other Ambulatory Visit (HOSPITAL_BASED_OUTPATIENT_CLINIC_OR_DEPARTMENT_OTHER): Payer: Medicare Other | Admitting: Lab

## 2013-05-20 ENCOUNTER — Encounter: Payer: Self-pay | Admitting: Nurse Practitioner

## 2013-05-20 ENCOUNTER — Other Ambulatory Visit: Payer: Self-pay | Admitting: Hematology & Oncology

## 2013-05-20 DIAGNOSIS — D693 Immune thrombocytopenic purpura: Secondary | ICD-10-CM

## 2013-05-20 DIAGNOSIS — D6859 Other primary thrombophilia: Secondary | ICD-10-CM

## 2013-05-20 DIAGNOSIS — I69359 Hemiplegia and hemiparesis following cerebral infarction affecting unspecified side: Secondary | ICD-10-CM

## 2013-05-20 LAB — PROTIME-INR (CHCC SATELLITE)
INR: 2 (ref 2.0–3.5)
Protime: 24 Seconds — ABNORMAL HIGH (ref 10.6–13.4)

## 2013-05-20 LAB — CBC WITH DIFFERENTIAL (CANCER CENTER ONLY)
BASO#: 0.1 10*3/uL (ref 0.0–0.2)
BASO%: 0.5 % (ref 0.0–2.0)
EOS%: 2.5 % (ref 0.0–7.0)
Eosinophils Absolute: 0.3 10*3/uL (ref 0.0–0.5)
HEMATOCRIT: 43.9 % (ref 38.7–49.9)
HGB: 14.6 g/dL (ref 13.0–17.1)
LYMPH#: 2.4 10*3/uL (ref 0.9–3.3)
LYMPH%: 21.9 % (ref 14.0–48.0)
MCH: 28.9 pg (ref 28.0–33.4)
MCHC: 33.3 g/dL (ref 32.0–35.9)
MCV: 87 fL (ref 82–98)
MONO#: 1.7 10*3/uL — ABNORMAL HIGH (ref 0.1–0.9)
MONO%: 15.3 % — ABNORMAL HIGH (ref 0.0–13.0)
NEUT#: 6.6 10*3/uL — ABNORMAL HIGH (ref 1.5–6.5)
NEUT%: 59.8 % (ref 40.0–80.0)
Platelets: 72 10*3/uL — ABNORMAL LOW (ref 145–400)
RBC: 5.06 10*6/uL (ref 4.20–5.70)
RDW: 15.8 % — AB (ref 11.1–15.7)
WBC: 11.1 10*3/uL — ABNORMAL HIGH (ref 4.0–10.0)

## 2013-05-20 NOTE — Progress Notes (Signed)
Per Dr. Marin Olp pt was instructed to begin his steroids again and we will see him back next week for a cbc. Pt verbalized understanding.

## 2013-05-27 ENCOUNTER — Other Ambulatory Visit (HOSPITAL_BASED_OUTPATIENT_CLINIC_OR_DEPARTMENT_OTHER): Payer: Medicare Other | Admitting: Lab

## 2013-05-27 DIAGNOSIS — I82409 Acute embolism and thrombosis of unspecified deep veins of unspecified lower extremity: Secondary | ICD-10-CM

## 2013-05-27 DIAGNOSIS — Z7901 Long term (current) use of anticoagulants: Secondary | ICD-10-CM

## 2013-05-27 DIAGNOSIS — I69359 Hemiplegia and hemiparesis following cerebral infarction affecting unspecified side: Secondary | ICD-10-CM

## 2013-05-27 LAB — CBC WITH DIFFERENTIAL (CANCER CENTER ONLY)
BASO#: 0 10*3/uL (ref 0.0–0.2)
BASO%: 0.1 % (ref 0.0–2.0)
EOS%: 4.9 % (ref 0.0–7.0)
Eosinophils Absolute: 0.6 10*3/uL — ABNORMAL HIGH (ref 0.0–0.5)
HCT: 44.7 % (ref 38.7–49.9)
HEMOGLOBIN: 14.9 g/dL (ref 13.0–17.1)
LYMPH#: 3.3 10*3/uL (ref 0.9–3.3)
LYMPH%: 27.3 % (ref 14.0–48.0)
MCH: 29.1 pg (ref 28.0–33.4)
MCHC: 33.3 g/dL (ref 32.0–35.9)
MCV: 87 fL (ref 82–98)
MONO#: 2.3 10*3/uL — ABNORMAL HIGH (ref 0.1–0.9)
MONO%: 19 % — ABNORMAL HIGH (ref 0.0–13.0)
NEUT#: 5.9 10*3/uL (ref 1.5–6.5)
NEUT%: 48.7 % (ref 40.0–80.0)
Platelets: 168 10*3/uL (ref 145–400)
RBC: 5.12 10*6/uL (ref 4.20–5.70)
RDW: 16.6 % — AB (ref 11.1–15.7)
WBC: 12.1 10*3/uL — ABNORMAL HIGH (ref 4.0–10.0)

## 2013-05-27 LAB — PROTIME-INR (CHCC SATELLITE)
INR: 2.8 (ref 2.0–3.5)
Protime: 33.6 Seconds — ABNORMAL HIGH (ref 10.6–13.4)

## 2013-05-27 LAB — TECHNOLOGIST REVIEW CHCC SATELLITE

## 2013-06-03 ENCOUNTER — Other Ambulatory Visit (HOSPITAL_BASED_OUTPATIENT_CLINIC_OR_DEPARTMENT_OTHER): Payer: Medicare Other | Admitting: Lab

## 2013-06-03 DIAGNOSIS — I69959 Hemiplegia and hemiparesis following unspecified cerebrovascular disease affecting unspecified side: Secondary | ICD-10-CM

## 2013-06-03 DIAGNOSIS — I82409 Acute embolism and thrombosis of unspecified deep veins of unspecified lower extremity: Secondary | ICD-10-CM

## 2013-06-03 DIAGNOSIS — I69359 Hemiplegia and hemiparesis following cerebral infarction affecting unspecified side: Secondary | ICD-10-CM

## 2013-06-03 LAB — CBC WITH DIFFERENTIAL (CANCER CENTER ONLY)
BASO#: 0 10*3/uL (ref 0.0–0.2)
BASO%: 0.2 % (ref 0.0–2.0)
EOS%: 1.9 % (ref 0.0–7.0)
Eosinophils Absolute: 0.2 10*3/uL (ref 0.0–0.5)
HCT: 47.4 % (ref 38.7–49.9)
HEMOGLOBIN: 15.5 g/dL (ref 13.0–17.1)
LYMPH#: 2.7 10*3/uL (ref 0.9–3.3)
LYMPH%: 21.9 % (ref 14.0–48.0)
MCH: 29 pg (ref 28.0–33.4)
MCHC: 32.7 g/dL (ref 32.0–35.9)
MCV: 89 fL (ref 82–98)
MONO#: 1.8 10*3/uL — ABNORMAL HIGH (ref 0.1–0.9)
MONO%: 14.1 % — ABNORMAL HIGH (ref 0.0–13.0)
NEUT%: 61.9 % (ref 40.0–80.0)
NEUTROS ABS: 7.7 10*3/uL — AB (ref 1.5–6.5)
PLATELETS: 239 10*3/uL (ref 145–400)
RBC: 5.35 10*6/uL (ref 4.20–5.70)
RDW: 17.6 % — ABNORMAL HIGH (ref 11.1–15.7)
WBC: 12.5 10*3/uL — AB (ref 4.0–10.0)

## 2013-06-03 LAB — PROTIME-INR (CHCC SATELLITE)
INR: 2.1 (ref 2.0–3.5)
PROTIME: 25.2 s — AB (ref 10.6–13.4)

## 2013-06-03 LAB — TECHNOLOGIST REVIEW CHCC SATELLITE

## 2013-06-10 ENCOUNTER — Other Ambulatory Visit: Payer: Medicare Other | Admitting: Lab

## 2013-06-10 ENCOUNTER — Ambulatory Visit: Payer: Medicare Other | Admitting: Hematology & Oncology

## 2013-06-10 ENCOUNTER — Other Ambulatory Visit: Payer: Self-pay | Admitting: Nurse Practitioner

## 2013-06-10 MED ORDER — DEXAMETHASONE 4 MG PO TABS: ORAL_TABLET | ORAL | Status: DC

## 2013-06-11 ENCOUNTER — Telehealth: Payer: Self-pay | Admitting: Hematology & Oncology

## 2013-06-11 NOTE — Telephone Encounter (Signed)
Left pt message to call and reschedule MD appointment

## 2013-06-17 ENCOUNTER — Telehealth: Payer: Self-pay | Admitting: Hematology & Oncology

## 2013-06-17 ENCOUNTER — Other Ambulatory Visit: Payer: Medicare Other | Admitting: Lab

## 2013-06-17 NOTE — Telephone Encounter (Signed)
Patient's wife called and cx 06/17/13 apt and resch for 06/18/13

## 2013-06-18 ENCOUNTER — Other Ambulatory Visit (HOSPITAL_BASED_OUTPATIENT_CLINIC_OR_DEPARTMENT_OTHER): Payer: Medicare Other | Admitting: Lab

## 2013-06-18 DIAGNOSIS — I82409 Acute embolism and thrombosis of unspecified deep veins of unspecified lower extremity: Secondary | ICD-10-CM

## 2013-06-18 DIAGNOSIS — I69959 Hemiplegia and hemiparesis following unspecified cerebrovascular disease affecting unspecified side: Secondary | ICD-10-CM

## 2013-06-18 DIAGNOSIS — I69359 Hemiplegia and hemiparesis following cerebral infarction affecting unspecified side: Secondary | ICD-10-CM

## 2013-06-18 LAB — CBC WITH DIFFERENTIAL (CANCER CENTER ONLY)
BASO#: 0 10*3/uL (ref 0.0–0.2)
BASO%: 0.4 % (ref 0.0–2.0)
EOS ABS: 0.7 10*3/uL — AB (ref 0.0–0.5)
EOS%: 7 % (ref 0.0–7.0)
HCT: 42.9 % (ref 38.7–49.9)
HGB: 14.3 g/dL (ref 13.0–17.1)
LYMPH#: 2.8 10*3/uL (ref 0.9–3.3)
LYMPH%: 30.4 % (ref 14.0–48.0)
MCH: 29.4 pg (ref 28.0–33.4)
MCHC: 33.3 g/dL (ref 32.0–35.9)
MCV: 88 fL (ref 82–98)
MONO#: 1.9 10*3/uL — AB (ref 0.1–0.9)
MONO%: 20.6 % — AB (ref 0.0–13.0)
NEUT%: 41.6 % (ref 40.0–80.0)
NEUTROS ABS: 3.9 10*3/uL (ref 1.5–6.5)
Platelets: 124 10*3/uL — ABNORMAL LOW (ref 145–400)
RBC: 4.86 10*6/uL (ref 4.20–5.70)
RDW: 16.7 % — ABNORMAL HIGH (ref 11.1–15.7)
WBC: 9.3 10*3/uL (ref 4.0–10.0)

## 2013-06-18 LAB — PROTIME-INR (CHCC SATELLITE)
INR: 2.2 (ref 2.0–3.5)
PROTIME: 26.4 s — AB (ref 10.6–13.4)

## 2013-06-24 ENCOUNTER — Other Ambulatory Visit (HOSPITAL_BASED_OUTPATIENT_CLINIC_OR_DEPARTMENT_OTHER): Payer: Medicare Other | Admitting: Lab

## 2013-06-24 DIAGNOSIS — I69359 Hemiplegia and hemiparesis following cerebral infarction affecting unspecified side: Secondary | ICD-10-CM

## 2013-06-24 DIAGNOSIS — D6859 Other primary thrombophilia: Secondary | ICD-10-CM

## 2013-06-24 DIAGNOSIS — D693 Immune thrombocytopenic purpura: Secondary | ICD-10-CM

## 2013-06-24 LAB — CBC WITH DIFFERENTIAL (CANCER CENTER ONLY)
BASO#: 0.1 10*3/uL (ref 0.0–0.2)
BASO%: 0.5 % (ref 0.0–2.0)
EOS%: 3.8 % (ref 0.0–7.0)
Eosinophils Absolute: 0.4 10*3/uL (ref 0.0–0.5)
HCT: 42.7 % (ref 38.7–49.9)
HGB: 14.4 g/dL (ref 13.0–17.1)
LYMPH#: 2.7 10*3/uL (ref 0.9–3.3)
LYMPH%: 25.2 % (ref 14.0–48.0)
MCH: 29.9 pg (ref 28.0–33.4)
MCHC: 33.7 g/dL (ref 32.0–35.9)
MCV: 89 fL (ref 82–98)
MONO#: 1.9 10*3/uL — ABNORMAL HIGH (ref 0.1–0.9)
MONO%: 17.4 % — AB (ref 0.0–13.0)
NEUT%: 53.1 % (ref 40.0–80.0)
NEUTROS ABS: 5.7 10*3/uL (ref 1.5–6.5)
Platelets: 207 10*3/uL (ref 145–400)
RBC: 4.82 10*6/uL (ref 4.20–5.70)
RDW: 16.6 % — AB (ref 11.1–15.7)
WBC: 10.7 10*3/uL — ABNORMAL HIGH (ref 4.0–10.0)

## 2013-06-24 LAB — PROTIME-INR (CHCC SATELLITE)
INR: 2.5 (ref 2.0–3.5)
Protime: 30 Seconds — ABNORMAL HIGH (ref 10.6–13.4)

## 2013-07-01 ENCOUNTER — Other Ambulatory Visit (HOSPITAL_BASED_OUTPATIENT_CLINIC_OR_DEPARTMENT_OTHER): Payer: Medicare Other | Admitting: Lab

## 2013-07-01 DIAGNOSIS — I82409 Acute embolism and thrombosis of unspecified deep veins of unspecified lower extremity: Secondary | ICD-10-CM

## 2013-07-01 DIAGNOSIS — Z7901 Long term (current) use of anticoagulants: Secondary | ICD-10-CM

## 2013-07-01 DIAGNOSIS — I69359 Hemiplegia and hemiparesis following cerebral infarction affecting unspecified side: Secondary | ICD-10-CM

## 2013-07-01 LAB — CBC WITH DIFFERENTIAL (CANCER CENTER ONLY)
BASO#: 0 10*3/uL (ref 0.0–0.2)
BASO%: 0.4 % (ref 0.0–2.0)
EOS%: 3.8 % (ref 0.0–7.0)
Eosinophils Absolute: 0.4 10*3/uL (ref 0.0–0.5)
HCT: 42.8 % (ref 38.7–49.9)
HGB: 14.3 g/dL (ref 13.0–17.1)
LYMPH#: 2.5 10*3/uL (ref 0.9–3.3)
LYMPH%: 26.6 % (ref 14.0–48.0)
MCH: 29.7 pg (ref 28.0–33.4)
MCHC: 33.4 g/dL (ref 32.0–35.9)
MCV: 89 fL (ref 82–98)
MONO#: 1.8 10*3/uL — ABNORMAL HIGH (ref 0.1–0.9)
MONO%: 19.3 % — ABNORMAL HIGH (ref 0.0–13.0)
NEUT#: 4.7 10*3/uL (ref 1.5–6.5)
NEUT%: 49.9 % (ref 40.0–80.0)
Platelets: 207 10*3/uL (ref 145–400)
RBC: 4.82 10*6/uL (ref 4.20–5.70)
RDW: 16.8 % — AB (ref 11.1–15.7)
WBC: 9.5 10*3/uL (ref 4.0–10.0)

## 2013-07-01 LAB — PROTIME-INR (CHCC SATELLITE)
INR: 2.2 (ref 2.0–3.5)
Protime: 26.4 Seconds — ABNORMAL HIGH (ref 10.6–13.4)

## 2013-07-07 ENCOUNTER — Telehealth: Payer: Self-pay | Admitting: Hematology & Oncology

## 2013-07-07 NOTE — Telephone Encounter (Signed)
Left message with [phone] lab time not 245 pm

## 2013-07-08 ENCOUNTER — Other Ambulatory Visit (HOSPITAL_BASED_OUTPATIENT_CLINIC_OR_DEPARTMENT_OTHER): Payer: Medicare Other | Admitting: Lab

## 2013-07-08 ENCOUNTER — Ambulatory Visit (HOSPITAL_BASED_OUTPATIENT_CLINIC_OR_DEPARTMENT_OTHER): Payer: Medicare Other | Admitting: Hematology & Oncology

## 2013-07-08 ENCOUNTER — Other Ambulatory Visit: Payer: Medicare Other | Admitting: Lab

## 2013-07-08 VITALS — BP 144/68 | HR 82 | Temp 98.4°F | Resp 16 | Wt 177.0 lb

## 2013-07-08 DIAGNOSIS — D693 Immune thrombocytopenic purpura: Secondary | ICD-10-CM

## 2013-07-08 DIAGNOSIS — I69359 Hemiplegia and hemiparesis following cerebral infarction affecting unspecified side: Secondary | ICD-10-CM

## 2013-07-08 DIAGNOSIS — Z7901 Long term (current) use of anticoagulants: Secondary | ICD-10-CM

## 2013-07-08 LAB — CBC WITH DIFFERENTIAL (CANCER CENTER ONLY)
BASO#: 0.1 10*3/uL (ref 0.0–0.2)
BASO%: 0.5 % (ref 0.0–2.0)
EOS%: 5.4 % (ref 0.0–7.0)
Eosinophils Absolute: 0.5 10*3/uL (ref 0.0–0.5)
HCT: 43.8 % (ref 38.7–49.9)
HEMOGLOBIN: 14.7 g/dL (ref 13.0–17.1)
LYMPH#: 2.8 10*3/uL (ref 0.9–3.3)
LYMPH%: 29.1 % (ref 14.0–48.0)
MCH: 30.1 pg (ref 28.0–33.4)
MCHC: 33.6 g/dL (ref 32.0–35.9)
MCV: 90 fL (ref 82–98)
MONO#: 2.2 10*3/uL — AB (ref 0.1–0.9)
MONO%: 23.6 % — ABNORMAL HIGH (ref 0.0–13.0)
NEUT#: 3.9 10*3/uL (ref 1.5–6.5)
NEUT%: 41.4 % (ref 40.0–80.0)
Platelets: 132 10*3/uL — ABNORMAL LOW (ref 145–400)
RBC: 4.89 10*6/uL (ref 4.20–5.70)
RDW: 16.6 % — AB (ref 11.1–15.7)
WBC: 9.5 10*3/uL (ref 4.0–10.0)

## 2013-07-08 LAB — PROTIME-INR (CHCC SATELLITE)
INR: 2.3 (ref 2.0–3.5)
Protime: 27.6 Seconds — ABNORMAL HIGH (ref 10.6–13.4)

## 2013-07-08 NOTE — Progress Notes (Signed)
Hematology and Oncology Follow Up Visit  Mark [name] [id number] 1946-02-08 68 y.o. 07/08/2013   Principle Diagnosis:  . Refractory immune thrombocytopenia. 2. 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 3. Positive lupus anticoagulant.  Current Therapy:   1. Coumadin to maintain INR between 2-3 (the patient currently taking     2 mg a day). 2. Nplate as indicated for platelet count less than 100,000. 3. Decadron - the patient to take at his discretion.     Interim History:  Mr.  [name] is is back for followup. He comes in weekly for lab work. So far, he's done very well. We've not had to give him any Npate.  He has had no problems with respect to bleeding. He is on Coumadin.  He still doing physical therapy. He goes down to [facility] for this.  He's had no nausea vomiting. He's had no cough or shortness of breath. He's had no abdominal pain. He's had some occasional back discomfort but this is chronic. He's had no leg swelling.  Medications: Current outpatient prescriptions:alendronate (FOSAMAX) 70 MG tablet, Take 70 mg by mouth every 7 (seven) days. Take with a full glass of water on an empty stomach. , Disp: , Rfl: ;  amLODipine (NORVASC) 2.5 MG tablet, Take 2.5 mg by mouth daily., Disp: , Rfl: ;  cetirizine (ZYRTEC) 10 MG tablet, Take 10 mg by mouth as needed for allergies., Disp: , Rfl:  Cholecalciferol (VITAMIN D3) 400 UNITS CAPS, Take 400 mg by mouth daily. 2 tabs = 800 mg, Disp: , Rfl: ;  dexamethasone (DECADRON) 4 MG tablet, TAKE 10 TABLETS BY MOUTH FOR 4 DAYS., Disp: 40 tablet, Rfl: 0;  famotidine (PEPCID) 40 MG tablet, Take 40 mg by mouth 2 (two) times daily as needed. , Disp: , Rfl: ;  levETIRAcetam (KEPPRA) 500 MG tablet, 500 mg. 3 in am and 3 in pm, Disp: , Rfl:  pyridOXINE (VITAMIN B-6) 100 MG tablet, Take 100 mg by mouth 2 (two) times daily., Disp: , Rfl: ;  sertraline (ZOLOFT) 100 MG tablet, Take 100 mg by mouth daily., Disp: , Rfl: ;  Tamsulosin  HCl (FLOMAX) 0.4 MG CAPS, Take 0.8 mg by mouth 2 (two) times daily. , Disp: , Rfl: ;  vitamin k 100 MCG tablet, Take 100 mcg by mouth daily. , Disp: , Rfl: ;  warfarin (COUMADIN) 2 MG tablet, Take 1 tablet (2 mg total) by mouth daily., Disp: 60 tablet, Rfl: 1 FLUoxetine (PROZAC) 40 MG capsule, Take 40 mg by mouth daily., Disp: , Rfl: ;  Omeprazole-Sodium Bicarbonate (ZEGERID PO), Take by mouth at bedtime., Disp: , Rfl: ;  rosuvastatin (CRESTOR) 20 MG tablet, Take 1 tablet (20 mg total) by mouth daily., Disp: 30 tablet, Rfl: 6  Allergies: No Known Allergies  Past Medical History, Surgical history, Social history, and Family History were reviewed and updated.  Review of Systems: As above  Physical Exam:  weight is 177 lb (80.287 kg). His oral temperature is 98.4 F (36.9 C). His blood pressure is 144/68 and his pulse is 82. His respiration is 16.   Well-developed well-nourished gentleman. His head neck exam shows no ocular or oral lesions. There are no palpable cervical or supraclavicular lymph nodes. Lungs are clear and bilaterally. Cardiac exam regular in rhythm with no murmurs rubs or bruits. Abdomen is soft. Has good bowel sounds. There is no fluid wave. There is no palpable liver or spleen. Extremities shows some stasis dermatitis changes in his lower legs. He has  good pulses. He has some still weakness in the right arm. There is some slight weakness in the right leg but this is minimal. He has good pulses in his upper extremities. Neurological exam shows no focal neurological deficits.  Lab Results  Component Value Date   WBC 9.5 07/08/2013   HGB 14.7 07/08/2013   HCT 43.8 07/08/2013   MCV 90 07/08/2013   PLT 132* 07/08/2013     Chemistry      Component Value Date/Time   NA 136 08/14/2011 0920   NA 134 01/18/2008 0810   K 4.2 08/14/2011 0920   K 4.3 01/18/2008 0810   CL 104 08/14/2011 0920   CL 100 01/18/2008 0810   CO2 22 08/14/2011 0920   CO2 24 01/18/2008 0810   BUN 24* 08/14/2011 0920   BUN  37* 01/18/2008 0810   CREATININE 1.12 08/14/2011 0920   CREATININE 1.5* 01/18/2008 0810      Component Value Date/Time   CALCIUM 8.8 08/14/2011 0920   CALCIUM 8.7 01/18/2008 0810   ALKPHOS 52 08/14/2011 0920   AST 28 08/14/2011 0920   ALT 65* 08/14/2011 0920   BILITOT 0.5 08/14/2011 0920         Impression and Plan: Mark [name] is 68 year old gentleman with both a hypercoagulable problem and a bleeding problem. As such, he is on both Coumadin and, as needed, Nplate. His platelet count has been doing quite well.  We have to follow him weekly does because of his levels that change quickly. We have to try to stay on top of this to minimize his risk of bleeding and also a risk of clotting.  It sounds like he may try to get his driver's license. I don't see a problem with this from my point of view.  We will see him back ourselves in 6 weeks.   I spent a good 30 minutes with them today. I I like to review  his lab work with them and answer questions that they may have.     Volanda Napoleon, MD 3/26/20155:41 PM

## 2013-07-09 ENCOUNTER — Telehealth: Payer: Self-pay | Admitting: Hematology & Oncology

## 2013-07-09 NOTE — Telephone Encounter (Signed)
Left message with 4-2, 4-9 lab and to get schedule when he comes in.

## 2013-07-14 ENCOUNTER — Encounter: Payer: Self-pay | Admitting: *Deleted

## 2013-07-14 ENCOUNTER — Other Ambulatory Visit (HOSPITAL_BASED_OUTPATIENT_CLINIC_OR_DEPARTMENT_OTHER): Payer: Medicare Other | Admitting: Lab

## 2013-07-14 DIAGNOSIS — Z7901 Long term (current) use of anticoagulants: Secondary | ICD-10-CM

## 2013-07-14 DIAGNOSIS — I69359 Hemiplegia and hemiparesis following cerebral infarction affecting unspecified side: Secondary | ICD-10-CM

## 2013-07-14 DIAGNOSIS — I82409 Acute embolism and thrombosis of unspecified deep veins of unspecified lower extremity: Secondary | ICD-10-CM

## 2013-07-14 LAB — CBC WITH DIFFERENTIAL (CANCER CENTER ONLY)
BASO#: 0.1 10*3/uL (ref 0.0–0.2)
BASO%: 0.6 % (ref 0.0–2.0)
EOS%: 5.5 % (ref 0.0–7.0)
Eosinophils Absolute: 0.6 10*3/uL — ABNORMAL HIGH (ref 0.0–0.5)
HCT: 44.9 % (ref 38.7–49.9)
HGB: 15.3 g/dL (ref 13.0–17.1)
LYMPH#: 2.5 10*3/uL (ref 0.9–3.3)
LYMPH%: 24.7 % (ref 14.0–48.0)
MCH: 30.1 pg (ref 28.0–33.4)
MCHC: 34.1 g/dL (ref 32.0–35.9)
MCV: 88 fL (ref 82–98)
MONO#: 2.3 10*3/uL — ABNORMAL HIGH (ref 0.1–0.9)
MONO%: 22.9 % — AB (ref 0.0–13.0)
NEUT%: 46.3 % (ref 40.0–80.0)
NEUTROS ABS: 4.7 10*3/uL (ref 1.5–6.5)
PLATELETS: 88 10*3/uL — AB (ref 145–400)
RBC: 5.09 10*6/uL (ref 4.20–5.70)
RDW: 16.4 % — ABNORMAL HIGH (ref 11.1–15.7)
WBC: 10.1 10*3/uL — ABNORMAL HIGH (ref 4.0–10.0)

## 2013-07-14 LAB — PROTIME-INR (CHCC SATELLITE)
INR: 1.9 — AB (ref 2.0–3.5)
PROTIME: 22.8 s — AB (ref 10.6–13.4)

## 2013-07-14 NOTE — Progress Notes (Signed)
Patients platelets are 88 .  Dr. Marin Olp would like for patient to receive Nplate however if insurance wont allow it, he would like for him to go back on Decadron.  Spoke to patient, he will have to call insurance company to get approval and would most likely have to pay [phone] out of pocket so he would like to start on Decadron and have insurance question answered by next visit.

## 2013-07-15 ENCOUNTER — Other Ambulatory Visit: Payer: Medicare Other | Admitting: Lab

## 2013-07-22 ENCOUNTER — Other Ambulatory Visit (HOSPITAL_BASED_OUTPATIENT_CLINIC_OR_DEPARTMENT_OTHER): Payer: Medicare Other | Admitting: Lab

## 2013-07-22 DIAGNOSIS — Z7901 Long term (current) use of anticoagulants: Secondary | ICD-10-CM

## 2013-07-22 DIAGNOSIS — I69359 Hemiplegia and hemiparesis following cerebral infarction affecting unspecified side: Secondary | ICD-10-CM

## 2013-07-22 DIAGNOSIS — D693 Immune thrombocytopenic purpura: Secondary | ICD-10-CM

## 2013-07-22 LAB — PROTIME-INR (CHCC SATELLITE)
INR: 2.3 (ref 2.0–3.5)
Protime: 27.6 Seconds — ABNORMAL HIGH (ref 10.6–13.4)

## 2013-07-22 LAB — CBC WITH DIFFERENTIAL (CANCER CENTER ONLY)
BASO#: 0 10*3/uL (ref 0.0–0.2)
BASO%: 0.1 % (ref 0.0–2.0)
EOS%: 5.2 % (ref 0.0–7.0)
Eosinophils Absolute: 0.8 10*3/uL — ABNORMAL HIGH (ref 0.0–0.5)
HCT: 43.3 % (ref 38.7–49.9)
HGB: 15 g/dL (ref 13.0–17.1)
LYMPH#: 2.7 10*3/uL (ref 0.9–3.3)
LYMPH%: 18.2 % (ref 14.0–48.0)
MCH: 30.9 pg (ref 28.0–33.4)
MCHC: 34.6 g/dL (ref 32.0–35.9)
MCV: 89 fL (ref 82–98)
MONO#: 2.1 10*3/uL — ABNORMAL HIGH (ref 0.1–0.9)
MONO%: 13.9 % — AB (ref 0.0–13.0)
NEUT%: 62.6 % (ref 40.0–80.0)
NEUTROS ABS: 9.3 10*3/uL — AB (ref 1.5–6.5)
RBC: 4.86 10*6/uL (ref 4.20–5.70)
RDW: 16 % — ABNORMAL HIGH (ref 11.1–15.7)
WBC: 14.9 10*3/uL — ABNORMAL HIGH (ref 4.0–10.0)

## 2013-07-22 NOTE — Progress Notes (Signed)
Spoke with pt in lobby re: lab results. Aware of normal platelets and INR. No new orders. dph

## 2013-07-29 ENCOUNTER — Other Ambulatory Visit (HOSPITAL_BASED_OUTPATIENT_CLINIC_OR_DEPARTMENT_OTHER): Payer: Medicare Other | Admitting: Lab

## 2013-07-29 ENCOUNTER — Encounter: Payer: Self-pay | Admitting: *Deleted

## 2013-07-29 DIAGNOSIS — I69359 Hemiplegia and hemiparesis following cerebral infarction affecting unspecified side: Secondary | ICD-10-CM

## 2013-07-29 DIAGNOSIS — D6859 Other primary thrombophilia: Secondary | ICD-10-CM

## 2013-07-29 LAB — CBC WITH DIFFERENTIAL (CANCER CENTER ONLY)
BASO#: 0 10*3/uL (ref 0.0–0.2)
BASO%: 0.3 % (ref 0.0–2.0)
EOS%: 4.7 % (ref 0.0–7.0)
Eosinophils Absolute: 0.6 10*3/uL — ABNORMAL HIGH (ref 0.0–0.5)
HCT: 43.3 % (ref 38.7–49.9)
HGB: 14.7 g/dL (ref 13.0–17.1)
LYMPH#: 2.8 10*3/uL (ref 0.9–3.3)
LYMPH%: 23.1 % (ref 14.0–48.0)
MCH: 30.3 pg (ref 28.0–33.4)
MCHC: 33.9 g/dL (ref 32.0–35.9)
MCV: 89 fL (ref 82–98)
MONO#: 1.7 10*3/uL — ABNORMAL HIGH (ref 0.1–0.9)
MONO%: 13.9 % — ABNORMAL HIGH (ref 0.0–13.0)
NEUT#: 7 10*3/uL — ABNORMAL HIGH (ref 1.5–6.5)
NEUT%: 58 % (ref 40.0–80.0)
Platelets: 207 10*3/uL (ref 145–400)
RBC: 4.85 10*6/uL (ref 4.20–5.70)
RDW: 16.1 % — AB (ref 11.1–15.7)
WBC: 12 10*3/uL — AB (ref 4.0–10.0)

## 2013-07-29 LAB — PROTIME-INR (CHCC SATELLITE)
INR: 1.6 — ABNORMAL LOW (ref 2.0–3.5)
PROTIME: 19.2 s — AB (ref 10.6–13.4)

## 2013-07-29 NOTE — Progress Notes (Signed)
INR 1.6  No change in Coumadin dose at this time per d.r ennever

## 2013-08-05 ENCOUNTER — Other Ambulatory Visit (HOSPITAL_BASED_OUTPATIENT_CLINIC_OR_DEPARTMENT_OTHER): Payer: Medicare Other | Admitting: Lab

## 2013-08-05 ENCOUNTER — Other Ambulatory Visit: Payer: Self-pay | Admitting: *Deleted

## 2013-08-05 DIAGNOSIS — D6859 Other primary thrombophilia: Secondary | ICD-10-CM

## 2013-08-05 DIAGNOSIS — I69359 Hemiplegia and hemiparesis following cerebral infarction affecting unspecified side: Secondary | ICD-10-CM

## 2013-08-05 LAB — CBC WITH DIFFERENTIAL (CANCER CENTER ONLY)
BASO#: 0 10*3/uL (ref 0.0–0.2)
BASO%: 0.3 % (ref 0.0–2.0)
EOS%: 10.5 % — ABNORMAL HIGH (ref 0.0–7.0)
Eosinophils Absolute: 0.8 10*3/uL — ABNORMAL HIGH (ref 0.0–0.5)
HCT: 42.9 % (ref 38.7–49.9)
HGB: 14.3 g/dL (ref 13.0–17.1)
LYMPH#: 2.3 10*3/uL (ref 0.9–3.3)
LYMPH%: 29.4 % (ref 14.0–48.0)
MCH: 30 pg (ref 28.0–33.4)
MCHC: 33.3 g/dL (ref 32.0–35.9)
MCV: 90 fL (ref 82–98)
MONO#: 1.8 10*3/uL — AB (ref 0.1–0.9)
MONO%: 23 % — AB (ref 0.0–13.0)
NEUT%: 36.8 % — ABNORMAL LOW (ref 40.0–80.0)
NEUTROS ABS: 2.8 10*3/uL (ref 1.5–6.5)
Platelets: 85 10*3/uL — ABNORMAL LOW (ref 145–400)
RBC: 4.76 10*6/uL (ref 4.20–5.70)
RDW: 15.8 % — AB (ref 11.1–15.7)
WBC: 7.7 10*3/uL (ref 4.0–10.0)

## 2013-08-05 LAB — PROTIME-INR (CHCC SATELLITE)
INR: 1.8 — ABNORMAL LOW (ref 2.0–3.5)
PROTIME: 21.6 s — AB (ref 10.6–13.4)

## 2013-08-05 MED ORDER — DEXAMETHASONE 4 MG PO TABS: ORAL_TABLET | ORAL | Status: DC

## 2013-08-12 ENCOUNTER — Other Ambulatory Visit (HOSPITAL_BASED_OUTPATIENT_CLINIC_OR_DEPARTMENT_OTHER): Payer: Medicare Other | Admitting: Lab

## 2013-08-12 DIAGNOSIS — D6859 Other primary thrombophilia: Secondary | ICD-10-CM

## 2013-08-12 DIAGNOSIS — I69359 Hemiplegia and hemiparesis following cerebral infarction affecting unspecified side: Secondary | ICD-10-CM

## 2013-08-12 LAB — CBC WITH DIFFERENTIAL (CANCER CENTER ONLY)
BASO#: 0 10*3/uL (ref 0.0–0.2)
BASO%: 0.5 % (ref 0.0–2.0)
EOS%: 13.9 % — AB (ref 0.0–7.0)
Eosinophils Absolute: 1.1 10*3/uL — ABNORMAL HIGH (ref 0.0–0.5)
HCT: 43.6 % (ref 38.7–49.9)
HGB: 14.4 g/dL (ref 13.0–17.1)
LYMPH#: 2.3 10*3/uL (ref 0.9–3.3)
LYMPH%: 28.3 % (ref 14.0–48.0)
MCH: 29.9 pg (ref 28.0–33.4)
MCHC: 33 g/dL (ref 32.0–35.9)
MCV: 91 fL (ref 82–98)
MONO#: 1.6 10*3/uL — ABNORMAL HIGH (ref 0.1–0.9)
MONO%: 20.2 % — AB (ref 0.0–13.0)
NEUT#: 3 10*3/uL (ref 1.5–6.5)
NEUT%: 37.1 % — ABNORMAL LOW (ref 40.0–80.0)
PLATELETS: 168 10*3/uL (ref 145–400)
RBC: 4.82 10*6/uL (ref 4.20–5.70)
RDW: 16.2 % — AB (ref 11.1–15.7)
WBC: 8.1 10*3/uL (ref 4.0–10.0)

## 2013-08-12 LAB — PROTIME-INR (CHCC SATELLITE)
INR: 1.2 — AB (ref 2.0–3.5)
PROTIME: 14.4 s — AB (ref 10.6–13.4)

## 2013-08-12 LAB — TECHNOLOGIST REVIEW CHCC SATELLITE

## 2013-08-15 ENCOUNTER — Other Ambulatory Visit: Payer: Self-pay | Admitting: Hematology & Oncology

## 2013-08-19 ENCOUNTER — Other Ambulatory Visit: Payer: Medicare Other | Admitting: Lab

## 2013-08-19 ENCOUNTER — Ambulatory Visit: Payer: Medicare Other | Admitting: Hematology & Oncology

## 2013-08-19 ENCOUNTER — Telehealth: Payer: Self-pay | Admitting: Hematology & Oncology

## 2013-08-19 NOTE — Telephone Encounter (Signed)
Patient's wife called and cx 08-19-13 apt and resch for 09-23-13 Mark [name] was notified of cx apt

## 2013-08-26 ENCOUNTER — Other Ambulatory Visit (HOSPITAL_BASED_OUTPATIENT_CLINIC_OR_DEPARTMENT_OTHER): Payer: Medicare Other | Admitting: Lab

## 2013-08-26 DIAGNOSIS — I69359 Hemiplegia and hemiparesis following cerebral infarction affecting unspecified side: Secondary | ICD-10-CM

## 2013-08-26 DIAGNOSIS — D473 Essential (hemorrhagic) thrombocythemia: Secondary | ICD-10-CM

## 2013-08-26 DIAGNOSIS — D6859 Other primary thrombophilia: Secondary | ICD-10-CM

## 2013-08-26 LAB — CBC WITH DIFFERENTIAL (CANCER CENTER ONLY)
BASO#: 0 10*3/uL (ref 0.0–0.2)
BASO%: 0.3 % (ref 0.0–2.0)
EOS%: 6.5 % (ref 0.0–7.0)
Eosinophils Absolute: 0.6 10*3/uL — ABNORMAL HIGH (ref 0.0–0.5)
HEMATOCRIT: 43.2 % (ref 38.7–49.9)
HGB: 14.6 g/dL (ref 13.0–17.1)
LYMPH#: 2.5 10*3/uL (ref 0.9–3.3)
LYMPH%: 26.8 % (ref 14.0–48.0)
MCH: 30.4 pg (ref 28.0–33.4)
MCHC: 33.8 g/dL (ref 32.0–35.9)
MCV: 90 fL (ref 82–98)
MONO#: 1.8 10*3/uL — ABNORMAL HIGH (ref 0.1–0.9)
MONO%: 19.5 % — ABNORMAL HIGH (ref 0.0–13.0)
NEUT#: 4.4 10*3/uL (ref 1.5–6.5)
NEUT%: 46.9 % (ref 40.0–80.0)
Platelets: 195 10*3/uL (ref 145–400)
RBC: 4.8 10*6/uL (ref 4.20–5.70)
RDW: 15.6 % (ref 11.1–15.7)
WBC: 9.3 10*3/uL (ref 4.0–10.0)

## 2013-08-26 LAB — PROTIME-INR (CHCC SATELLITE)
INR: 1.5 — ABNORMAL LOW (ref 2.0–3.5)
Protime: 18 Seconds — ABNORMAL HIGH (ref 10.6–13.4)

## 2013-09-01 ENCOUNTER — Other Ambulatory Visit: Payer: Self-pay | Admitting: *Deleted

## 2013-09-01 DIAGNOSIS — D693 Immune thrombocytopenic purpura: Secondary | ICD-10-CM

## 2013-09-02 ENCOUNTER — Other Ambulatory Visit (HOSPITAL_BASED_OUTPATIENT_CLINIC_OR_DEPARTMENT_OTHER): Payer: Medicare Other | Admitting: Lab

## 2013-09-02 DIAGNOSIS — D693 Immune thrombocytopenic purpura: Secondary | ICD-10-CM

## 2013-09-02 DIAGNOSIS — D6859 Other primary thrombophilia: Secondary | ICD-10-CM

## 2013-09-02 DIAGNOSIS — D473 Essential (hemorrhagic) thrombocythemia: Secondary | ICD-10-CM

## 2013-09-02 LAB — CBC WITH DIFFERENTIAL (CANCER CENTER ONLY)
BASO#: 0 10*3/uL (ref 0.0–0.2)
BASO%: 0.4 % (ref 0.0–2.0)
EOS%: 6.2 % (ref 0.0–7.0)
Eosinophils Absolute: 0.6 10*3/uL — ABNORMAL HIGH (ref 0.0–0.5)
HEMATOCRIT: 43.7 % (ref 38.7–49.9)
HGB: 15 g/dL (ref 13.0–17.1)
LYMPH#: 2.5 10*3/uL (ref 0.9–3.3)
LYMPH%: 25 % (ref 14.0–48.0)
MCH: 30.7 pg (ref 28.0–33.4)
MCHC: 34.3 g/dL (ref 32.0–35.9)
MCV: 89 fL (ref 82–98)
MONO#: 1.9 10*3/uL — ABNORMAL HIGH (ref 0.1–0.9)
MONO%: 19.1 % — ABNORMAL HIGH (ref 0.0–13.0)
NEUT#: 4.9 10*3/uL (ref 1.5–6.5)
NEUT%: 49.3 % (ref 40.0–80.0)
PLATELETS: 109 10*3/uL — AB (ref 145–400)
RBC: 4.89 10*6/uL (ref 4.20–5.70)
RDW: 15 % (ref 11.1–15.7)
WBC: 9.9 10*3/uL (ref 4.0–10.0)

## 2013-09-02 LAB — PROTIME-INR (CHCC SATELLITE)
INR: 1.6 — ABNORMAL LOW (ref 2.0–3.5)
PROTIME: 19.2 s — AB (ref 10.6–13.4)

## 2013-09-08 ENCOUNTER — Other Ambulatory Visit: Payer: Self-pay | Admitting: *Deleted

## 2013-09-08 DIAGNOSIS — D693 Immune thrombocytopenic purpura: Secondary | ICD-10-CM

## 2013-09-09 ENCOUNTER — Other Ambulatory Visit (HOSPITAL_BASED_OUTPATIENT_CLINIC_OR_DEPARTMENT_OTHER): Payer: Medicare Other | Admitting: Lab

## 2013-09-09 DIAGNOSIS — D6859 Other primary thrombophilia: Secondary | ICD-10-CM

## 2013-09-09 DIAGNOSIS — D473 Essential (hemorrhagic) thrombocythemia: Secondary | ICD-10-CM

## 2013-09-09 DIAGNOSIS — D693 Immune thrombocytopenic purpura: Secondary | ICD-10-CM

## 2013-09-09 LAB — CBC WITH DIFFERENTIAL (CANCER CENTER ONLY)
BASO#: 0 10*3/uL (ref 0.0–0.2)
BASO%: 0.3 % (ref 0.0–2.0)
EOS%: 4 % (ref 0.0–7.0)
Eosinophils Absolute: 0.4 10*3/uL (ref 0.0–0.5)
HCT: 43.5 % (ref 38.7–49.9)
HGB: 14.8 g/dL (ref 13.0–17.1)
LYMPH#: 2.9 10*3/uL (ref 0.9–3.3)
LYMPH%: 31.6 % (ref 14.0–48.0)
MCH: 30.4 pg (ref 28.0–33.4)
MCHC: 34 g/dL (ref 32.0–35.9)
MCV: 89 fL (ref 82–98)
MONO#: 1.6 10*3/uL — ABNORMAL HIGH (ref 0.1–0.9)
MONO%: 17.5 % — ABNORMAL HIGH (ref 0.0–13.0)
NEUT#: 4.3 10*3/uL (ref 1.5–6.5)
NEUT%: 46.6 % (ref 40.0–80.0)
PLATELETS: 158 10*3/uL (ref 145–400)
RBC: 4.87 10*6/uL (ref 4.20–5.70)
RDW: 14.9 % (ref 11.1–15.7)
WBC: 9.2 10*3/uL (ref 4.0–10.0)

## 2013-09-09 LAB — PROTIME-INR (CHCC SATELLITE)
INR: 1.7 — AB (ref 2.0–3.5)
PROTIME: 20.4 s — AB (ref 10.6–13.4)

## 2013-09-15 ENCOUNTER — Other Ambulatory Visit: Payer: Self-pay | Admitting: *Deleted

## 2013-09-15 DIAGNOSIS — D693 Immune thrombocytopenic purpura: Secondary | ICD-10-CM

## 2013-09-16 ENCOUNTER — Other Ambulatory Visit (HOSPITAL_BASED_OUTPATIENT_CLINIC_OR_DEPARTMENT_OTHER): Payer: Medicare Other | Admitting: Lab

## 2013-09-16 DIAGNOSIS — D473 Essential (hemorrhagic) thrombocythemia: Secondary | ICD-10-CM

## 2013-09-16 DIAGNOSIS — D693 Immune thrombocytopenic purpura: Secondary | ICD-10-CM

## 2013-09-16 DIAGNOSIS — D6859 Other primary thrombophilia: Secondary | ICD-10-CM

## 2013-09-16 LAB — CBC WITH DIFFERENTIAL (CANCER CENTER ONLY)
BASO#: 0 10*3/uL (ref 0.0–0.2)
BASO%: 0.4 % (ref 0.0–2.0)
EOS%: 5.6 % (ref 0.0–7.0)
Eosinophils Absolute: 0.5 10*3/uL (ref 0.0–0.5)
HEMATOCRIT: 42.6 % (ref 38.7–49.9)
HEMOGLOBIN: 14.5 g/dL (ref 13.0–17.1)
LYMPH#: 2.6 10*3/uL (ref 0.9–3.3)
LYMPH%: 26.5 % (ref 14.0–48.0)
MCH: 30.7 pg (ref 28.0–33.4)
MCHC: 34 g/dL (ref 32.0–35.9)
MCV: 90 fL (ref 82–98)
MONO#: 2 10*3/uL — ABNORMAL HIGH (ref 0.1–0.9)
MONO%: 20.8 % — ABNORMAL HIGH (ref 0.0–13.0)
NEUT#: 4.5 10*3/uL (ref 1.5–6.5)
NEUT%: 46.7 % (ref 40.0–80.0)
Platelets: 227 10*3/uL (ref 145–400)
RBC: 4.72 10*6/uL (ref 4.20–5.70)
RDW: 15.1 % (ref 11.1–15.7)
WBC: 9.6 10*3/uL (ref 4.0–10.0)

## 2013-09-16 LAB — PROTIME-INR (CHCC SATELLITE)
INR: 1.6 — ABNORMAL LOW (ref 2.0–3.5)
Protime: 19.2 Seconds — ABNORMAL HIGH (ref 10.6–13.4)

## 2013-09-23 ENCOUNTER — Ambulatory Visit: Payer: Medicare Other | Admitting: Hematology & Oncology

## 2013-09-23 ENCOUNTER — Telehealth: Payer: Self-pay | Admitting: Hematology & Oncology

## 2013-09-23 ENCOUNTER — Other Ambulatory Visit: Payer: Self-pay | Admitting: *Deleted

## 2013-09-23 ENCOUNTER — Ambulatory Visit (HOSPITAL_BASED_OUTPATIENT_CLINIC_OR_DEPARTMENT_OTHER): Payer: Medicare Other | Admitting: Lab

## 2013-09-23 DIAGNOSIS — D473 Essential (hemorrhagic) thrombocythemia: Secondary | ICD-10-CM

## 2013-09-23 DIAGNOSIS — D6859 Other primary thrombophilia: Secondary | ICD-10-CM

## 2013-09-23 DIAGNOSIS — D693 Immune thrombocytopenic purpura: Secondary | ICD-10-CM

## 2013-09-23 LAB — CBC WITH DIFFERENTIAL (CANCER CENTER ONLY)
BASO#: 0 10*3/uL (ref 0.0–0.2)
BASO%: 0.2 % (ref 0.0–2.0)
EOS%: 3.7 % (ref 0.0–7.0)
Eosinophils Absolute: 0.3 10*3/uL (ref 0.0–0.5)
HEMATOCRIT: 43.3 % (ref 38.7–49.9)
HGB: 14.7 g/dL (ref 13.0–17.1)
LYMPH#: 2.5 10*3/uL (ref 0.9–3.3)
LYMPH%: 30.5 % (ref 14.0–48.0)
MCH: 30.7 pg (ref 28.0–33.4)
MCHC: 33.9 g/dL (ref 32.0–35.9)
MCV: 90 fL (ref 82–98)
MONO#: 1.4 10*3/uL — ABNORMAL HIGH (ref 0.1–0.9)
MONO%: 17.7 % — ABNORMAL HIGH (ref 0.0–13.0)
NEUT#: 3.9 10*3/uL (ref 1.5–6.5)
NEUT%: 47.9 % (ref 40.0–80.0)
Platelets: 166 10*3/uL (ref 145–400)
RBC: 4.79 10*6/uL (ref 4.20–5.70)
RDW: 14.7 % (ref 11.1–15.7)
WBC: 8.1 10*3/uL (ref 4.0–10.0)

## 2013-09-23 LAB — PROTIME-INR (CHCC SATELLITE)
INR: 1.6 — ABNORMAL LOW (ref 2.0–3.5)
Protime: 19.2 Seconds — ABNORMAL HIGH (ref 10.6–13.4)

## 2013-09-23 NOTE — Progress Notes (Unsigned)
INR 1.6 today. Dr Marin Olp wants pt to take 4mg  a day, 2mg  a day, 4mg  a day, and 2mg  a day until he comes back next week.

## 2013-09-23 NOTE — Telephone Encounter (Signed)
Due to patient arriving late for 09/23/13 apt, he resch MD apt for 10/12/13

## 2013-09-29 ENCOUNTER — Other Ambulatory Visit: Payer: Self-pay | Admitting: *Deleted

## 2013-09-29 DIAGNOSIS — D693 Immune thrombocytopenic purpura: Secondary | ICD-10-CM

## 2013-09-30 ENCOUNTER — Other Ambulatory Visit (HOSPITAL_BASED_OUTPATIENT_CLINIC_OR_DEPARTMENT_OTHER): Payer: Medicare Other | Admitting: Lab

## 2013-09-30 DIAGNOSIS — D693 Immune thrombocytopenic purpura: Secondary | ICD-10-CM

## 2013-09-30 LAB — CBC WITH DIFFERENTIAL (CANCER CENTER ONLY)
BASO#: 0 10*3/uL (ref 0.0–0.2)
BASO%: 0.3 % (ref 0.0–2.0)
EOS%: 3.9 % (ref 0.0–7.0)
Eosinophils Absolute: 0.4 10*3/uL (ref 0.0–0.5)
HCT: 43.6 % (ref 38.7–49.9)
HGB: 14.8 g/dL (ref 13.0–17.1)
LYMPH#: 2.8 10*3/uL (ref 0.9–3.3)
LYMPH%: 28.6 % (ref 14.0–48.0)
MCH: 30.7 pg (ref 28.0–33.4)
MCHC: 33.9 g/dL (ref 32.0–35.9)
MCV: 91 fL (ref 82–98)
MONO#: 1.7 10*3/uL — ABNORMAL HIGH (ref 0.1–0.9)
MONO%: 17.4 % — AB (ref 0.0–13.0)
NEUT%: 49.8 % (ref 40.0–80.0)
NEUTROS ABS: 4.8 10*3/uL (ref 1.5–6.5)
PLATELETS: 112 10*3/uL — AB (ref 145–400)
RBC: 4.82 10*6/uL (ref 4.20–5.70)
RDW: 14.8 % (ref 11.1–15.7)
WBC: 9.7 10*3/uL (ref 4.0–10.0)

## 2013-09-30 LAB — PROTIME-INR (CHCC SATELLITE)
INR: 2.9 (ref 2.0–3.5)
PROTIME: 34.8 s — AB (ref 10.6–13.4)

## 2013-10-04 ENCOUNTER — Other Ambulatory Visit: Payer: Self-pay | Admitting: Hematology & Oncology

## 2013-10-06 ENCOUNTER — Other Ambulatory Visit: Payer: Self-pay | Admitting: *Deleted

## 2013-10-06 DIAGNOSIS — D693 Immune thrombocytopenic purpura: Secondary | ICD-10-CM

## 2013-10-07 ENCOUNTER — Other Ambulatory Visit (HOSPITAL_BASED_OUTPATIENT_CLINIC_OR_DEPARTMENT_OTHER): Payer: Medicare Other | Admitting: Lab

## 2013-10-07 DIAGNOSIS — D693 Immune thrombocytopenic purpura: Secondary | ICD-10-CM

## 2013-10-07 DIAGNOSIS — D6859 Other primary thrombophilia: Secondary | ICD-10-CM

## 2013-10-07 LAB — CBC WITH DIFFERENTIAL (CANCER CENTER ONLY)
BASO#: 0 10*3/uL (ref 0.0–0.2)
BASO%: 0.4 % (ref 0.0–2.0)
EOS%: 5 % (ref 0.0–7.0)
Eosinophils Absolute: 0.4 10*3/uL (ref 0.0–0.5)
HCT: 42.8 % (ref 38.7–49.9)
HGB: 14.7 g/dL (ref 13.0–17.1)
LYMPH#: 2.6 10*3/uL (ref 0.9–3.3)
LYMPH%: 31.5 % (ref 14.0–48.0)
MCH: 30.6 pg (ref 28.0–33.4)
MCHC: 34.3 g/dL (ref 32.0–35.9)
MCV: 89 fL (ref 82–98)
MONO#: 1.3 10*3/uL — ABNORMAL HIGH (ref 0.1–0.9)
MONO%: 15.1 % — ABNORMAL HIGH (ref 0.0–13.0)
NEUT#: 4 10*3/uL (ref 1.5–6.5)
NEUT%: 48 % (ref 40.0–80.0)
Platelets: 149 10*3/uL (ref 145–400)
RBC: 4.81 10*6/uL (ref 4.20–5.70)
RDW: 14.6 % (ref 11.1–15.7)
WBC: 8.4 10*3/uL (ref 4.0–10.0)

## 2013-10-07 LAB — PROTIME-INR (CHCC SATELLITE)
INR: 2.9 (ref 2.0–3.5)
PROTIME: 34.8 s — AB (ref 10.6–13.4)

## 2013-10-12 ENCOUNTER — Ambulatory Visit (HOSPITAL_BASED_OUTPATIENT_CLINIC_OR_DEPARTMENT_OTHER): Payer: Medicare Other | Admitting: Hematology & Oncology

## 2013-10-12 ENCOUNTER — Encounter: Payer: Self-pay | Admitting: Hematology & Oncology

## 2013-10-12 VITALS — BP 135/69 | HR 67 | Temp 98.1°F | Resp 18 | Ht 69.0 in | Wt 209.0 lb

## 2013-10-12 DIAGNOSIS — I63232 Cerebral infarction due to unspecified occlusion or stenosis of left carotid arteries: Secondary | ICD-10-CM

## 2013-10-12 DIAGNOSIS — D693 Immune thrombocytopenic purpura: Secondary | ICD-10-CM

## 2013-10-12 DIAGNOSIS — M7989 Other specified soft tissue disorders: Secondary | ICD-10-CM

## 2013-10-12 DIAGNOSIS — I69998 Other sequelae following unspecified cerebrovascular disease: Secondary | ICD-10-CM

## 2013-10-12 DIAGNOSIS — M6281 Muscle weakness (generalized): Secondary | ICD-10-CM

## 2013-10-13 NOTE — Progress Notes (Signed)
Hematology and Oncology Follow Up Visit  TRAVEION RUDDOCK [id number] 1945-09-10 68 y.o. 10/13/2013   Principle Diagnosis:   Refractory immune thrombocytopenia. 2. 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 3. Positive lupus anticoagulant.  Current Therapy:   1. Coumadin to maintain INR between 2-3 (the patient currently taking     2 mg a day). 2. Nplate as indicated for platelet count less than 100,000. 3. Decadron - the patient to take at his discretion.     Interim History:  Mr.  Burkett is back for followup. He is doing okay. He still done a lot of physical therapy. He goes to [facility] occasion for physical therapy.  He is a tolerated the Coumadin. He's had no bleeding. He receivNplate   on occasion. He by has not had this now for several months.  He's had no cough. He's had no shortness of breath. He's had no headache. He's had no further weakness issues.  Medications: Current outpatient prescriptions:alendronate (FOSAMAX) 70 MG tablet, Take 70 mg by mouth every 7 (seven) days. Take with a full glass of water on an empty stomach. , Disp: , Rfl: ;  amLODipine (NORVASC) 2.5 MG tablet, Take 5 mg by mouth daily. , Disp: , Rfl: ;  cetirizine (ZYRTEC) 10 MG tablet, Take 10 mg by mouth as needed for allergies. occ takes Benadryl if out of Zyrtec, Disp: , Rfl:  Cholecalciferol (VITAMIN D3) 400 UNITS CAPS, Take 400 mg by mouth 2 (two) times daily. TAKES 2 TABS IN AM AND 1 IN PM, Disp: , Rfl: ;  dexamethasone (DECADRON) 4 MG tablet, TAKE 10 TABLETS BY MOUTH daily FOR 4 DAYS., Disp: 80 tablet, Rfl: 6;  famotidine (PEPCID) 40 MG tablet, Take 40 mg by mouth 2 (two) times daily as needed. , Disp: , Rfl: ;  levETIRAcetam (KEPPRA) 500 MG tablet, 500 mg. 3 in am and 3 in pm, Disp: , Rfl:  Multiple Vitamin (MULTIVITAMIN) capsule, Take 1 capsule by mouth daily. HAS VITAMIN K  IN THE CAPSULE, Disp: , Rfl: ;  pyridOXINE (VITAMIN B-6) 100 MG tablet, Take 100 mg by mouth 2 (two) times  daily., Disp: , Rfl: ;  rosuvastatin (CRESTOR) 20 MG tablet, Take 1 tablet (20 mg total) by mouth daily., Disp: 30 tablet, Rfl: 6;  sertraline (ZOLOFT) 100 MG tablet, Take 100 mg by mouth daily., Disp: , Rfl:  Tamsulosin HCl (FLOMAX) 0.4 MG CAPS, Take 0.8 mg by mouth daily. @ CAP = 0.8 MG, Disp: , Rfl: ;  vitamin k 100 MCG tablet, Take 100 mcg by mouth daily. , Disp: , Rfl: ;  warfarin (COUMADIN) 2 MG tablet, AS DIRECTED, Disp: , Rfl:   Allergies: No Known Allergies  Past Medical History, Surgical history, Social history, and Family History were reviewed and updated.  Review of Systems:  as above   Physical Exam:  height is 5\' 9"  (1.753 m) and weight is 209 lb (94.802 kg). His oral temperature is 98.1 F (36.7 C). His blood pressure is 135/69 and his pulse is 67. His respiration is 18.    well-developed well-nourished white gentleman. Head and neck exam shows no ocular or oral reason. There is no adenopathy in the neck. Lungs are clear. Cardiac exam regular in rhythm. He has no murmurs rubs or bruits. Abdomen soft. He has no fluid. There is no probable liver or spleen tip. Extremities shows no clubbing cyanosis or edema. He has chronic weakness in his left side.   Lab Results  Component Value Date  WBC 8.4 10/07/2013   HGB 14.7 10/07/2013   HCT 42.8 10/07/2013   MCV 89 10/07/2013   PLT 149 10/07/2013     Chemistry      Component Value Date/Time   NA 136 08/14/2011 0920   NA 134 01/18/2008 0810   K 4.2 08/14/2011 0920   K 4.3 01/18/2008 0810   CL 104 08/14/2011 0920   CL 100 01/18/2008 0810   CO2 22 08/14/2011 0920   CO2 24 01/18/2008 0810   BUN 24* 08/14/2011 0920   BUN 37* 01/18/2008 0810   CREATININE 1.12 08/14/2011 0920   CREATININE 1.5* 01/18/2008 0810      Component Value Date/Time   CALCIUM 8.8 08/14/2011 0920   CALCIUM 8.7 01/18/2008 0810   ALKPHOS 52 08/14/2011 0920   AST 28 08/14/2011 0920   ALT 65* 08/14/2011 0920   BILITOT 0.5 08/14/2011 0920         Impression and Plan: Mr. Mirsky  is  68 year old gentleman with refractory immune thrombocytopenia. He also has a history of a thrombotic CVA. He's had residual left-sided weakness. This probably was 3 or 4 years ago. As such happened while he was up in [place].  He is recovering slowly. He is really aggressive with to his therapy. He is trying his best.  We do not have to give any injections today. When his platelet count gets below 20,000, he takes Decadron which always worse.  We will check his CBC and Coumadin now every 2 weeks. I think this is reasonable. Her bowels him back myself in about 6 weeks.   Volanda Napoleon, MD 7/1/20155:38 PM

## 2013-10-14 ENCOUNTER — Other Ambulatory Visit (HOSPITAL_BASED_OUTPATIENT_CLINIC_OR_DEPARTMENT_OTHER): Payer: Medicare Other | Admitting: Lab

## 2013-10-14 ENCOUNTER — Ambulatory Visit (HOSPITAL_BASED_OUTPATIENT_CLINIC_OR_DEPARTMENT_OTHER)
Admission: RE | Admit: 2013-10-14 | Discharge: 2013-10-14 | Disposition: A | Discharge status: Home / Self Care | Payer: Medicare Other | Source: Ambulatory Visit | Attending: Hematology & Oncology | Admitting: Hematology & Oncology

## 2013-10-14 DIAGNOSIS — M7989 Other specified soft tissue disorders: Secondary | ICD-10-CM

## 2013-10-14 DIAGNOSIS — D693 Immune thrombocytopenic purpura: Secondary | ICD-10-CM

## 2013-10-14 DIAGNOSIS — Z86718 Personal history of other venous thrombosis and embolism: Secondary | ICD-10-CM | POA: Insufficient documentation

## 2013-10-14 DIAGNOSIS — I63232 Cerebral infarction due to unspecified occlusion or stenosis of left carotid arteries: Secondary | ICD-10-CM

## 2013-10-14 LAB — PROTIME-INR (CHCC SATELLITE)
INR: 3 (ref 2.0–3.5)
Protime: 36 Seconds — ABNORMAL HIGH (ref 10.6–13.4)

## 2013-10-14 LAB — CBC WITH DIFFERENTIAL (CANCER CENTER ONLY)
BASO#: 0.1 10*3/uL (ref 0.0–0.2)
BASO%: 0.6 % (ref 0.0–2.0)
EOS ABS: 0.4 10*3/uL (ref 0.0–0.5)
EOS%: 4.1 % (ref 0.0–7.0)
HCT: 43.5 % (ref 38.7–49.9)
HGB: 14.8 g/dL (ref 13.0–17.1)
LYMPH#: 2.5 10*3/uL (ref 0.9–3.3)
LYMPH%: 28.9 % (ref 14.0–48.0)
MCH: 30.4 pg (ref 28.0–33.4)
MCHC: 34 g/dL (ref 32.0–35.9)
MCV: 89 fL (ref 82–98)
MONO#: 1.5 10*3/uL — AB (ref 0.1–0.9)
MONO%: 17.9 % — AB (ref 0.0–13.0)
NEUT#: 4.2 10*3/uL (ref 1.5–6.5)
NEUT%: 48.5 % (ref 40.0–80.0)
PLATELETS: 219 10*3/uL (ref 145–400)
RBC: 4.87 10*6/uL (ref 4.20–5.70)
RDW: 14.7 % (ref 11.1–15.7)
WBC: 8.6 10*3/uL (ref 4.0–10.0)

## 2013-10-21 ENCOUNTER — Other Ambulatory Visit: Payer: Medicare Other | Admitting: Lab

## 2013-10-28 ENCOUNTER — Encounter: Payer: Self-pay | Admitting: *Deleted

## 2013-10-28 ENCOUNTER — Other Ambulatory Visit (HOSPITAL_BASED_OUTPATIENT_CLINIC_OR_DEPARTMENT_OTHER): Payer: Medicare Other | Admitting: Lab

## 2013-10-28 DIAGNOSIS — D6859 Other primary thrombophilia: Secondary | ICD-10-CM

## 2013-10-28 DIAGNOSIS — D693 Immune thrombocytopenic purpura: Secondary | ICD-10-CM

## 2013-10-28 DIAGNOSIS — I63232 Cerebral infarction due to unspecified occlusion or stenosis of left carotid arteries: Secondary | ICD-10-CM

## 2013-10-28 LAB — CBC WITH DIFFERENTIAL (CANCER CENTER ONLY)
BASO#: 0.1 10*3/uL (ref 0.0–0.2)
BASO%: 0.6 % (ref 0.0–2.0)
EOS%: 3.5 % (ref 0.0–7.0)
Eosinophils Absolute: 0.3 10*3/uL (ref 0.0–0.5)
HCT: 43.3 % (ref 38.7–49.9)
HGB: 14.9 g/dL (ref 13.0–17.1)
LYMPH#: 3 10*3/uL (ref 0.9–3.3)
LYMPH%: 34 % (ref 14.0–48.0)
MCH: 30.4 pg (ref 28.0–33.4)
MCHC: 34.4 g/dL (ref 32.0–35.9)
MCV: 88 fL (ref 82–98)
MONO#: 1.6 10*3/uL — ABNORMAL HIGH (ref 0.1–0.9)
MONO%: 17.4 % — AB (ref 0.0–13.0)
NEUT%: 44.5 % (ref 40.0–80.0)
NEUTROS ABS: 4 10*3/uL (ref 1.5–6.5)
PLATELETS: 110 10*3/uL — AB (ref 145–400)
RBC: 4.9 10*6/uL (ref 4.20–5.70)
RDW: 14.5 % (ref 11.1–15.7)
WBC: 8.9 10*3/uL (ref 4.0–10.0)

## 2013-10-28 LAB — PROTIME-INR (CHCC SATELLITE)
INR: 3 (ref 2.0–3.5)
PROTIME: 36 s — AB (ref 10.6–13.4)

## 2013-10-28 NOTE — Progress Notes (Signed)
Dr Marin Olp is okay with patient's PT/INR and does not want pt to make any changes to his regimen.

## 2013-11-04 ENCOUNTER — Other Ambulatory Visit: Payer: Medicare Other | Admitting: Lab

## 2013-11-11 ENCOUNTER — Other Ambulatory Visit (HOSPITAL_BASED_OUTPATIENT_CLINIC_OR_DEPARTMENT_OTHER): Payer: Medicare Other | Admitting: Lab

## 2013-11-11 DIAGNOSIS — M7989 Other specified soft tissue disorders: Secondary | ICD-10-CM

## 2013-11-11 DIAGNOSIS — I63239 Cerebral infarction due to unspecified occlusion or stenosis of unspecified carotid arteries: Secondary | ICD-10-CM

## 2013-11-11 DIAGNOSIS — I63232 Cerebral infarction due to unspecified occlusion or stenosis of left carotid arteries: Secondary | ICD-10-CM

## 2013-11-11 LAB — CBC WITH DIFFERENTIAL (CANCER CENTER ONLY)
BASO#: 0 10*3/uL (ref 0.0–0.2)
BASO%: 0.4 % (ref 0.0–2.0)
EOS%: 5.2 % (ref 0.0–7.0)
Eosinophils Absolute: 0.4 10*3/uL (ref 0.0–0.5)
HCT: 41.6 % (ref 38.7–49.9)
HEMOGLOBIN: 14.1 g/dL (ref 13.0–17.1)
LYMPH#: 2.8 10*3/uL (ref 0.9–3.3)
LYMPH%: 36.2 % (ref 14.0–48.0)
MCH: 30.2 pg (ref 28.0–33.4)
MCHC: 33.9 g/dL (ref 32.0–35.9)
MCV: 89 fL (ref 82–98)
MONO#: 1.5 10*3/uL — ABNORMAL HIGH (ref 0.1–0.9)
MONO%: 19.2 % — AB (ref 0.0–13.0)
NEUT%: 39 % — ABNORMAL LOW (ref 40.0–80.0)
NEUTROS ABS: 3 10*3/uL (ref 1.5–6.5)
Platelets: 202 10*3/uL (ref 145–400)
RBC: 4.67 10*6/uL (ref 4.20–5.70)
RDW: 14.9 % (ref 11.1–15.7)
WBC: 7.7 10*3/uL (ref 4.0–10.0)

## 2013-11-11 LAB — PROTIME-INR (CHCC SATELLITE)
INR: 2.4 (ref 2.0–3.5)
Protime: 28.8 Seconds — ABNORMAL HIGH (ref 10.6–13.4)

## 2013-11-12 ENCOUNTER — Other Ambulatory Visit: Payer: Self-pay | Admitting: Hematology & Oncology

## 2013-11-18 ENCOUNTER — Other Ambulatory Visit: Payer: Medicare Other | Admitting: Lab

## 2013-11-25 ENCOUNTER — Other Ambulatory Visit (HOSPITAL_BASED_OUTPATIENT_CLINIC_OR_DEPARTMENT_OTHER): Payer: Medicare Other | Admitting: Lab

## 2013-11-25 DIAGNOSIS — D6859 Other primary thrombophilia: Secondary | ICD-10-CM

## 2013-11-25 DIAGNOSIS — I63232 Cerebral infarction due to unspecified occlusion or stenosis of left carotid arteries: Secondary | ICD-10-CM

## 2013-11-25 DIAGNOSIS — D473 Essential (hemorrhagic) thrombocythemia: Secondary | ICD-10-CM

## 2013-11-25 LAB — CBC WITH DIFFERENTIAL (CANCER CENTER ONLY)
BASO#: 0 10*3/uL (ref 0.0–0.2)
BASO%: 0.5 % (ref 0.0–2.0)
EOS ABS: 0.4 10*3/uL (ref 0.0–0.5)
EOS%: 5.7 % (ref 0.0–7.0)
HCT: 42.7 % (ref 38.7–49.9)
HEMOGLOBIN: 14.6 g/dL (ref 13.0–17.1)
LYMPH#: 2.9 10*3/uL (ref 0.9–3.3)
LYMPH%: 37.3 % (ref 14.0–48.0)
MCH: 30.2 pg (ref 28.0–33.4)
MCHC: 34.2 g/dL (ref 32.0–35.9)
MCV: 88 fL (ref 82–98)
MONO#: 1.4 10*3/uL — ABNORMAL HIGH (ref 0.1–0.9)
MONO%: 18.1 % — ABNORMAL HIGH (ref 0.0–13.0)
NEUT%: 38.4 % — ABNORMAL LOW (ref 40.0–80.0)
NEUTROS ABS: 3 10*3/uL (ref 1.5–6.5)
Platelets: 120 10*3/uL — ABNORMAL LOW (ref 145–400)
RBC: 4.84 10*6/uL (ref 4.20–5.70)
RDW: 14.5 % (ref 11.1–15.7)
WBC: 7.7 10*3/uL (ref 4.0–10.0)

## 2013-11-25 LAB — PROTIME-INR (CHCC SATELLITE)
INR: 3 (ref 2.0–3.5)
Protime: 36 Seconds — ABNORMAL HIGH (ref 10.6–13.4)

## 2013-11-25 LAB — TECHNOLOGIST REVIEW CHCC SATELLITE

## 2013-12-02 ENCOUNTER — Other Ambulatory Visit: Payer: Medicare Other | Admitting: Lab

## 2013-12-07 ENCOUNTER — Telehealth: Payer: Self-pay | Admitting: Hematology & Oncology

## 2013-12-07 NOTE — Telephone Encounter (Signed)
Patient's wife called and cx 12/09/13 apt and resch for 12/08/13

## 2013-12-08 ENCOUNTER — Other Ambulatory Visit (HOSPITAL_BASED_OUTPATIENT_CLINIC_OR_DEPARTMENT_OTHER): Payer: Medicare Other | Admitting: Lab

## 2013-12-08 DIAGNOSIS — D473 Essential (hemorrhagic) thrombocythemia: Secondary | ICD-10-CM

## 2013-12-08 DIAGNOSIS — I63232 Cerebral infarction due to unspecified occlusion or stenosis of left carotid arteries: Secondary | ICD-10-CM

## 2013-12-08 DIAGNOSIS — D6859 Other primary thrombophilia: Secondary | ICD-10-CM

## 2013-12-08 LAB — CBC WITH DIFFERENTIAL (CANCER CENTER ONLY)
BASO#: 0 10*3/uL (ref 0.0–0.2)
BASO%: 0.5 % (ref 0.0–2.0)
EOS ABS: 0.5 10*3/uL (ref 0.0–0.5)
EOS%: 6.1 % (ref 0.0–7.0)
HCT: 43.5 % (ref 38.7–49.9)
HGB: 14.9 g/dL (ref 13.0–17.1)
LYMPH#: 2.7 10*3/uL (ref 0.9–3.3)
LYMPH%: 36.9 % (ref 14.0–48.0)
MCH: 30.3 pg (ref 28.0–33.4)
MCHC: 34.3 g/dL (ref 32.0–35.9)
MCV: 88 fL (ref 82–98)
MONO#: 1.4 10*3/uL — AB (ref 0.1–0.9)
MONO%: 18.7 % — AB (ref 0.0–13.0)
NEUT%: 37.8 % — ABNORMAL LOW (ref 40.0–80.0)
NEUTROS ABS: 2.8 10*3/uL (ref 1.5–6.5)
Platelets: 182 10*3/uL (ref 145–400)
RBC: 4.92 10*6/uL (ref 4.20–5.70)
RDW: 14.8 % (ref 11.1–15.7)
WBC: 7.4 10*3/uL (ref 4.0–10.0)

## 2013-12-08 LAB — PROTIME-INR (CHCC SATELLITE)
INR: 2.1 (ref 2.0–3.5)
PROTIME: 25.2 s — AB (ref 10.6–13.4)

## 2013-12-09 ENCOUNTER — Other Ambulatory Visit: Payer: Medicare Other | Admitting: Lab

## 2013-12-16 ENCOUNTER — Other Ambulatory Visit (HOSPITAL_BASED_OUTPATIENT_CLINIC_OR_DEPARTMENT_OTHER): Payer: Medicare Other | Admitting: Lab

## 2013-12-16 ENCOUNTER — Ambulatory Visit (HOSPITAL_BASED_OUTPATIENT_CLINIC_OR_DEPARTMENT_OTHER): Payer: Medicare Other | Admitting: Hematology & Oncology

## 2013-12-16 ENCOUNTER — Encounter: Payer: Self-pay | Admitting: Hematology & Oncology

## 2013-12-16 VITALS — BP 118/74 | HR 69 | Temp 98.0°F | Resp 20 | Ht 69.0 in | Wt 205.0 lb

## 2013-12-16 DIAGNOSIS — D693 Immune thrombocytopenic purpura: Secondary | ICD-10-CM

## 2013-12-16 DIAGNOSIS — I63232 Cerebral infarction due to unspecified occlusion or stenosis of left carotid arteries: Secondary | ICD-10-CM

## 2013-12-16 DIAGNOSIS — Z8673 Personal history of transient ischemic attack (TIA), and cerebral infarction without residual deficits: Secondary | ICD-10-CM

## 2013-12-16 LAB — CBC WITH DIFFERENTIAL (CANCER CENTER ONLY)
BASO#: 0 10*3/uL (ref 0.0–0.2)
BASO%: 0.3 % (ref 0.0–2.0)
EOS ABS: 0.3 10*3/uL (ref 0.0–0.5)
EOS%: 4.9 % (ref 0.0–7.0)
HCT: 46.9 % (ref 38.7–49.9)
HGB: 15.9 g/dL (ref 13.0–17.1)
LYMPH#: 2.8 10*3/uL (ref 0.9–3.3)
LYMPH%: 39.7 % (ref 14.0–48.0)
MCH: 29.7 pg (ref 28.0–33.4)
MCHC: 33.9 g/dL (ref 32.0–35.9)
MCV: 88 fL (ref 82–98)
MONO#: 1.3 10*3/uL — ABNORMAL HIGH (ref 0.1–0.9)
MONO%: 18.8 % — ABNORMAL HIGH (ref 0.0–13.0)
NEUT%: 36.3 % — ABNORMAL LOW (ref 40.0–80.0)
NEUTROS ABS: 2.6 10*3/uL (ref 1.5–6.5)
Platelets: 160 10*3/uL (ref 145–400)
RBC: 5.36 10*6/uL (ref 4.20–5.70)
RDW: 14.5 % (ref 11.1–15.7)
WBC: 7 10*3/uL (ref 4.0–10.0)

## 2013-12-16 LAB — PROTIME-INR (CHCC SATELLITE)
INR: 2.7 (ref 2.0–3.5)
Protime: 32.4 Seconds — ABNORMAL HIGH (ref 10.6–13.4)

## 2013-12-16 NOTE — Progress Notes (Signed)
Hematology and Oncology Follow Up Visit  Mark [name] [id number] 1945-12-18 68 y.o. 12/16/2013   Principle Diagnosis:   Refractory immune thrombocytopenia. 2. 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 3. Positive lupus anticoagulant.  Current Therapy:   1. Coumadin to maintain INR between     2. Nplate as indicated for platelet count less than 100,000. 3. Decadron - the patient to take at his discretion.     Interim History:  Mr.  Mark [name] is back for followup. He is doing okay. He still done a lot of physical therapy. He goes to [facility] occasion for physical therapy.  He is a tolerated the Coumadin. He's had no bleeding. He does receive Nplate. Thankfully, he's not had to get any Nplate for several months.  His would not have to do any Decadron. He does this when his platelet count gets below 50,000   His appetite has been quite good. He's had no nausea or vomiting.  He's had no cough. He's had no shortness of breath. He's had no headache. He's had no further weakness issues.  Medications: Current outpatient prescriptions:acetaminophen (TYLENOL) 500 MG tablet, Take 1,000 mg by mouth every 8 (eight) hours as needed., Disp: , Rfl: ;  alendronate (FOSAMAX) 70 MG tablet, Take 70 mg by mouth every 7 (seven) days. Take with a full glass of water on an empty stomach. , Disp: , Rfl: ;  amLODipine (NORVASC) 2.5 MG tablet, Take 5 mg by mouth daily. , Disp: , Rfl:  Cholecalciferol (VITAMIN D3) 1000 UNITS CAPS, Take by mouth 2 (two) times daily. Takes 1000 units in AM and 2,000 units in PM, Disp: , Rfl: ;  dexamethasone (DECADRON) 4 MG tablet, Take 10 mg by mouth. PRN ONLY  TAKES 40 MG DAILY FOR 4 DAYS THEN STOP, Disp: , Rfl: ;  diphenhydrAMINE (BENADRYL) 25 MG tablet, Take 25 mg by mouth every 6 (six) hours as needed., Disp: , Rfl:  escitalopram (LEXAPRO) 10 MG tablet, Take 10 mg by mouth daily., Disp: , Rfl: ;  famotidine (PEPCID) 40 MG tablet, Take 40 mg by mouth 2  (two) times daily as needed. , Disp: , Rfl: ;  levETIRAcetam (KEPPRA) 500 MG tablet, 500 mg. 3 tabs  in am and 3 tabs in pm, Disp: , Rfl: ;  Multiple Vitamin (MULTIVITAMIN) capsule, Take 1 capsule by mouth daily. HAS VITAMIN K  IN THE CAPSULE, Disp: , Rfl:  Multiple Vitamins-Minerals (MULTIVITAMIN WITH MINERALS) tablet, Take 1 tablet by mouth daily. 100 mcg of vitamin K in this multi., Disp: , Rfl: ;  pyridOXINE (VITAMIN B-6) 100 MG tablet, Take 100 mg by mouth 2 (two) times daily., Disp: , Rfl: ;  rosuvastatin (CRESTOR) 20 MG tablet, Take 1 tablet (20 mg total) by mouth daily., Disp: 30 tablet, Rfl: 6;  Tamsulosin HCl (FLOMAX) 0.4 MG CAPS, Take 0.8 mg by mouth daily. @ CAP = 0.8 MG, Disp: , Rfl:  warfarin (COUMADIN) 2 MG tablet, TAKE ONE TABLET BY MOUTH ONCE DAILY, Disp: 60 tablet, Rfl: 0  Allergies: No Known Allergies  Past Medical History, Surgical history, Social history, and Family History were reviewed and updated.  Review of Systems:  as above   Physical Exam:  height is 5\' 9"  (1.753 m) and weight is 205 lb (92.987 kg). His oral temperature is 98 F (36.7 C). His blood pressure is 118/74 and his pulse is 69. His respiration is 20.    well-developed well-nourished white gentleman. Head and neck exam shows no ocular or  oral reason. There is no adenopathy in the neck. Lungs are clear. Cardiac exam regular in rhythm. He has no murmurs rubs or bruits. Abdomen soft. He has no fluid. There is no probable liver or spleen tip. Extremities shows no clubbing cyanosis or edema. He has chronic weakness in his left side.   Lab Results  Component Value Date   WBC 7.0 12/16/2013   HGB 15.9 12/16/2013   HCT 46.9 12/16/2013   MCV 88 12/16/2013   PLT 160 12/16/2013     Chemistry      Component Value Date/Time   NA 136 08/14/2011 0920   NA 134 01/18/2008 0810   K 4.2 08/14/2011 0920   K 4.3 01/18/2008 0810   CL 104 08/14/2011 0920   CL 100 01/18/2008 0810   CO2 22 08/14/2011 0920   CO2 24 01/18/2008 0810   BUN 24*  08/14/2011 0920   BUN 37* 01/18/2008 0810   CREATININE 1.12 08/14/2011 0920   CREATININE 1.5* 01/18/2008 0810      Component Value Date/Time   CALCIUM 8.8 08/14/2011 0920   CALCIUM 8.7 01/18/2008 0810   ALKPHOS 52 08/14/2011 0920   AST 28 08/14/2011 0920   ALT 65* 08/14/2011 0920   BILITOT 0.5 08/14/2011 0920         Impression and Plan: Mark [name] is  68 year old gentleman with refractory immune thrombocytopenia. He also has a history of a thrombotic CVA. He's had residual left-sided weakness. This probably was back in October of 2012. This happened while he was up in [place] on business  He is recovering slowly. He is really aggressive with to his therapy. He is trying his best. He is trying to play the guitar. 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 When his platelet count gets below 50,,000, he takes Decadron which always works  We will check his CBC and Coumadin now every 2 weeks. I think this is reasonable. I will see him back myself in about 2 months  Volanda Napoleon, MD 9/3/20156:48 PM

## 2013-12-23 ENCOUNTER — Other Ambulatory Visit: Payer: Medicare Other | Admitting: Lab

## 2013-12-30 ENCOUNTER — Other Ambulatory Visit (HOSPITAL_BASED_OUTPATIENT_CLINIC_OR_DEPARTMENT_OTHER): Payer: Medicare Other | Admitting: Lab

## 2013-12-30 DIAGNOSIS — D6859 Other primary thrombophilia: Secondary | ICD-10-CM

## 2013-12-30 DIAGNOSIS — D693 Immune thrombocytopenic purpura: Secondary | ICD-10-CM

## 2013-12-30 LAB — CBC WITH DIFFERENTIAL (CANCER CENTER ONLY)
BASO#: 0 10*3/uL (ref 0.0–0.2)
BASO%: 0.4 % (ref 0.0–2.0)
EOS ABS: 0.4 10*3/uL (ref 0.0–0.5)
EOS%: 3.3 % (ref 0.0–7.0)
HEMATOCRIT: 43.8 % (ref 38.7–49.9)
HEMOGLOBIN: 15.1 g/dL (ref 13.0–17.1)
LYMPH#: 3.1 10*3/uL (ref 0.9–3.3)
LYMPH%: 28.2 % (ref 14.0–48.0)
MCH: 30 pg (ref 28.0–33.4)
MCHC: 34.5 g/dL (ref 32.0–35.9)
MCV: 87 fL (ref 82–98)
MONO#: 2.1 10*3/uL — AB (ref 0.1–0.9)
MONO%: 18.7 % — ABNORMAL HIGH (ref 0.0–13.0)
NEUT#: 5.4 10*3/uL (ref 1.5–6.5)
NEUT%: 49.4 % (ref 40.0–80.0)
Platelets: 192 10*3/uL (ref 145–400)
RBC: 5.03 10*6/uL (ref 4.20–5.70)
RDW: 14.5 % (ref 11.1–15.7)
WBC: 10.9 10*3/uL — AB (ref 4.0–10.0)

## 2013-12-30 LAB — COMPREHENSIVE METABOLIC PANEL
ALBUMIN: 4.2 g/dL (ref 3.5–5.2)
ALT: 70 U/L — ABNORMAL HIGH (ref 0–53)
AST: 38 U/L — ABNORMAL HIGH (ref 0–37)
Alkaline Phosphatase: 53 U/L (ref 39–117)
BUN: 21 mg/dL (ref 6–23)
CALCIUM: 9.1 mg/dL (ref 8.4–10.5)
CHLORIDE: 106 meq/L (ref 96–112)
CO2: 24 meq/L (ref 19–32)
Creatinine, Ser: 1.03 mg/dL (ref 0.50–1.35)
GLUCOSE: 101 mg/dL — AB (ref 70–99)
POTASSIUM: 4.2 meq/L (ref 3.5–5.3)
Sodium: 138 mEq/L (ref 135–145)
Total Bilirubin: 0.6 mg/dL (ref 0.2–1.2)
Total Protein: 6.7 g/dL (ref 6.0–8.3)

## 2013-12-30 LAB — PROTIME-INR (CHCC SATELLITE)
INR: 2.9 (ref 2.0–3.5)
Protime: 34.8 Seconds — ABNORMAL HIGH (ref 10.6–13.4)

## 2013-12-31 ENCOUNTER — Telehealth: Payer: Self-pay | Admitting: *Deleted

## 2013-12-31 NOTE — Telephone Encounter (Signed)
Message copied by Rico Ala on Fri Dec 31, 2013  4:26 PM ------      Message from: Burney Gauze R      Created: Thu Dec 30, 2013  5:24 PM       Call - coumadin level is perfect!!  No change in dose.  pete ------

## 2014-01-03 ENCOUNTER — Other Ambulatory Visit: Payer: Self-pay | Admitting: Hematology & Oncology

## 2014-01-06 ENCOUNTER — Other Ambulatory Visit: Payer: Medicare Other | Admitting: Lab

## 2014-01-13 ENCOUNTER — Telehealth: Payer: Self-pay | Admitting: Hematology & Oncology

## 2014-01-13 ENCOUNTER — Other Ambulatory Visit: Payer: Medicare Other | Admitting: Lab

## 2014-01-13 NOTE — Telephone Encounter (Signed)
Patient's wife called and cx 01/13/14 apt and resch for 01/14/14

## 2014-01-14 ENCOUNTER — Other Ambulatory Visit: Payer: Medicare Other | Admitting: Lab

## 2014-01-20 ENCOUNTER — Other Ambulatory Visit: Payer: Medicare Other | Admitting: Lab

## 2014-01-27 ENCOUNTER — Other Ambulatory Visit (HOSPITAL_BASED_OUTPATIENT_CLINIC_OR_DEPARTMENT_OTHER): Payer: Medicare Other | Admitting: Lab

## 2014-01-27 DIAGNOSIS — I63232 Cerebral infarction due to unspecified occlusion or stenosis of left carotid arteries: Secondary | ICD-10-CM

## 2014-01-27 DIAGNOSIS — I69998 Other sequelae following unspecified cerebrovascular disease: Secondary | ICD-10-CM

## 2014-01-27 LAB — PROTIME-INR (CHCC SATELLITE)
INR: 2.9 (ref 2.0–3.5)
PROTIME: 34.8 s — AB (ref 10.6–13.4)

## 2014-01-27 LAB — CBC WITH DIFFERENTIAL (CANCER CENTER ONLY)
BASO#: 0 10*3/uL (ref 0.0–0.2)
BASO%: 0.3 % (ref 0.0–2.0)
EOS ABS: 0.4 10*3/uL (ref 0.0–0.5)
EOS%: 4 % (ref 0.0–7.0)
HCT: 44.3 % (ref 38.7–49.9)
HGB: 15.1 g/dL (ref 13.0–17.1)
LYMPH#: 2.9 10*3/uL (ref 0.9–3.3)
LYMPH%: 32.9 % (ref 14.0–48.0)
MCH: 29.9 pg (ref 28.0–33.4)
MCHC: 34.1 g/dL (ref 32.0–35.9)
MCV: 88 fL (ref 82–98)
MONO#: 1.5 10*3/uL — AB (ref 0.1–0.9)
MONO%: 16.6 % — ABNORMAL HIGH (ref 0.0–13.0)
NEUT%: 46.2 % (ref 40.0–80.0)
NEUTROS ABS: 4 10*3/uL (ref 1.5–6.5)
PLATELETS: 211 10*3/uL (ref 145–400)
RBC: 5.05 10*6/uL (ref 4.20–5.70)
RDW: 14.5 % (ref 11.1–15.7)
WBC: 8.7 10*3/uL (ref 4.0–10.0)

## 2014-02-03 ENCOUNTER — Other Ambulatory Visit: Payer: Medicare Other | Admitting: Lab

## 2014-02-10 ENCOUNTER — Other Ambulatory Visit (HOSPITAL_BASED_OUTPATIENT_CLINIC_OR_DEPARTMENT_OTHER): Payer: Medicare Other | Admitting: Lab

## 2014-02-10 DIAGNOSIS — I63232 Cerebral infarction due to unspecified occlusion or stenosis of left carotid arteries: Secondary | ICD-10-CM

## 2014-02-10 DIAGNOSIS — D693 Immune thrombocytopenic purpura: Secondary | ICD-10-CM

## 2014-02-10 LAB — CBC WITH DIFFERENTIAL (CANCER CENTER ONLY)
BASO#: 0 10*3/uL (ref 0.0–0.2)
BASO%: 0.5 % (ref 0.0–2.0)
EOS ABS: 0.4 10*3/uL (ref 0.0–0.5)
EOS%: 4.4 % (ref 0.0–7.0)
HCT: 44.3 % (ref 38.7–49.9)
HGB: 14.9 g/dL (ref 13.0–17.1)
LYMPH#: 2.9 10*3/uL (ref 0.9–3.3)
LYMPH%: 34.7 % (ref 14.0–48.0)
MCH: 29.9 pg (ref 28.0–33.4)
MCHC: 33.6 g/dL (ref 32.0–35.9)
MCV: 89 fL (ref 82–98)
MONO#: 1.5 10*3/uL — ABNORMAL HIGH (ref 0.1–0.9)
MONO%: 17.8 % — AB (ref 0.0–13.0)
NEUT#: 3.5 10*3/uL (ref 1.5–6.5)
NEUT%: 42.6 % (ref 40.0–80.0)
Platelets: 145 10*3/uL (ref 145–400)
RBC: 4.98 10*6/uL (ref 4.20–5.70)
RDW: 14.7 % (ref 11.1–15.7)
WBC: 8.3 10*3/uL (ref 4.0–10.0)

## 2014-02-10 LAB — PROTIME-INR (CHCC SATELLITE)
INR: 2.1 (ref 2.0–3.5)
Protime: 25.2 Seconds — ABNORMAL HIGH (ref 10.6–13.4)

## 2014-02-14 ENCOUNTER — Other Ambulatory Visit: Payer: Self-pay | Admitting: *Deleted

## 2014-02-14 DIAGNOSIS — D693 Immune thrombocytopenic purpura: Secondary | ICD-10-CM

## 2014-02-15 ENCOUNTER — Other Ambulatory Visit: Payer: Self-pay | Admitting: *Deleted

## 2014-02-15 DIAGNOSIS — D693 Immune thrombocytopenic purpura: Secondary | ICD-10-CM

## 2014-02-15 MED ORDER — WARFARIN SODIUM 2 MG PO TABS: ORAL_TABLET | ORAL | Status: DC

## 2014-02-17 ENCOUNTER — Other Ambulatory Visit: Payer: Medicare Other | Admitting: Lab

## 2014-02-24 ENCOUNTER — Other Ambulatory Visit (HOSPITAL_BASED_OUTPATIENT_CLINIC_OR_DEPARTMENT_OTHER): Payer: Medicare Other | Admitting: Lab

## 2014-02-24 DIAGNOSIS — D696 Thrombocytopenia, unspecified: Secondary | ICD-10-CM

## 2014-02-24 DIAGNOSIS — Z8673 Personal history of transient ischemic attack (TIA), and cerebral infarction without residual deficits: Secondary | ICD-10-CM

## 2014-02-24 DIAGNOSIS — I63232 Cerebral infarction due to unspecified occlusion or stenosis of left carotid arteries: Secondary | ICD-10-CM

## 2014-02-24 LAB — CBC WITH DIFFERENTIAL (CANCER CENTER ONLY)
BASO#: 0 10*3/uL (ref 0.0–0.2)
BASO%: 0.3 % (ref 0.0–2.0)
EOS%: 3 % (ref 0.0–7.0)
Eosinophils Absolute: 0.3 10*3/uL (ref 0.0–0.5)
HCT: 45.9 % (ref 38.7–49.9)
HGB: 15.7 g/dL (ref 13.0–17.1)
LYMPH#: 2.9 10*3/uL (ref 0.9–3.3)
LYMPH%: 31.1 % (ref 14.0–48.0)
MCH: 30 pg (ref 28.0–33.4)
MCHC: 34.2 g/dL (ref 32.0–35.9)
MCV: 88 fL (ref 82–98)
MONO#: 1.4 10*3/uL — AB (ref 0.1–0.9)
MONO%: 14.9 % — ABNORMAL HIGH (ref 0.0–13.0)
NEUT%: 50.7 % (ref 40.0–80.0)
NEUTROS ABS: 4.7 10*3/uL (ref 1.5–6.5)
PLATELETS: 210 10*3/uL (ref 145–400)
RBC: 5.24 10*6/uL (ref 4.20–5.70)
RDW: 14.7 % (ref 11.1–15.7)
WBC: 9.3 10*3/uL (ref 4.0–10.0)

## 2014-02-24 LAB — PROTIME-INR (CHCC SATELLITE)
INR: 2.4 (ref 2.0–3.5)
PROTIME: 28.8 s — AB (ref 10.6–13.4)

## 2014-02-25 ENCOUNTER — Telehealth: Payer: Self-pay | Admitting: *Deleted

## 2014-02-25 NOTE — Telephone Encounter (Addendum)
Notified patient's wife.   ----- Message from Volanda Napoleon, MD sent at 02/24/2014  5:59 PM EST ----- Please call and tell him that there is no change in his Coumadin dose. Laurey Arrow

## 2014-03-03 ENCOUNTER — Other Ambulatory Visit: Payer: Medicare Other | Admitting: Lab

## 2014-03-09 ENCOUNTER — Other Ambulatory Visit (HOSPITAL_BASED_OUTPATIENT_CLINIC_OR_DEPARTMENT_OTHER): Payer: Medicare Other | Admitting: Lab

## 2014-03-09 DIAGNOSIS — I63232 Cerebral infarction due to unspecified occlusion or stenosis of left carotid arteries: Secondary | ICD-10-CM

## 2014-03-09 DIAGNOSIS — D693 Immune thrombocytopenic purpura: Secondary | ICD-10-CM

## 2014-03-09 DIAGNOSIS — Z8673 Personal history of transient ischemic attack (TIA), and cerebral infarction without residual deficits: Secondary | ICD-10-CM

## 2014-03-09 LAB — CBC WITH DIFFERENTIAL (CANCER CENTER ONLY)
BASO#: 0 10*3/uL (ref 0.0–0.2)
BASO%: 0.4 % (ref 0.0–2.0)
EOS%: 4.4 % (ref 0.0–7.0)
Eosinophils Absolute: 0.4 10*3/uL (ref 0.0–0.5)
HEMATOCRIT: 47.9 % (ref 38.7–49.9)
HGB: 16 g/dL (ref 13.0–17.1)
LYMPH#: 3 10*3/uL (ref 0.9–3.3)
LYMPH%: 30.9 % (ref 14.0–48.0)
MCH: 29.9 pg (ref 28.0–33.4)
MCHC: 33.4 g/dL (ref 32.0–35.9)
MCV: 89 fL (ref 82–98)
MONO#: 1.9 10*3/uL — ABNORMAL HIGH (ref 0.1–0.9)
MONO%: 20 % — ABNORMAL HIGH (ref 0.0–13.0)
NEUT#: 4.2 10*3/uL (ref 1.5–6.5)
NEUT%: 44.3 % (ref 40.0–80.0)
PLATELETS: 137 10*3/uL — AB (ref 145–400)
RBC: 5.36 10*6/uL (ref 4.20–5.70)
RDW: 14.9 % (ref 11.1–15.7)
WBC: 9.6 10*3/uL (ref 4.0–10.0)

## 2014-03-09 LAB — PROTIME-INR (CHCC SATELLITE)
INR: 2.6 (ref 2.0–3.5)
PROTIME: 31.2 s — AB (ref 10.6–13.4)

## 2014-03-11 ENCOUNTER — Encounter: Payer: Self-pay | Admitting: *Deleted

## 2014-03-11 NOTE — Progress Notes (Signed)
Lab results given to and discussed with patient on 11/25. He was happy with results.

## 2014-03-17 ENCOUNTER — Ambulatory Visit (HOSPITAL_BASED_OUTPATIENT_CLINIC_OR_DEPARTMENT_OTHER): Payer: Medicare Other | Admitting: Hematology & Oncology

## 2014-03-17 ENCOUNTER — Other Ambulatory Visit (HOSPITAL_BASED_OUTPATIENT_CLINIC_OR_DEPARTMENT_OTHER): Payer: Medicare Other | Admitting: Lab

## 2014-03-17 ENCOUNTER — Encounter: Payer: Self-pay | Admitting: Hematology & Oncology

## 2014-03-17 VITALS — BP 100/70 | HR 76 | Temp 97.9°F | Resp 18 | Ht 69.0 in | Wt 204.0 lb

## 2014-03-17 DIAGNOSIS — D693 Immune thrombocytopenic purpura: Secondary | ICD-10-CM

## 2014-03-17 DIAGNOSIS — Z8673 Personal history of transient ischemic attack (TIA), and cerebral infarction without residual deficits: Secondary | ICD-10-CM

## 2014-03-17 DIAGNOSIS — I63031 Cerebral infarction due to thrombosis of right carotid artery: Secondary | ICD-10-CM

## 2014-03-17 DIAGNOSIS — I63232 Cerebral infarction due to unspecified occlusion or stenosis of left carotid arteries: Secondary | ICD-10-CM

## 2014-03-17 LAB — CBC WITH DIFFERENTIAL (CANCER CENTER ONLY)
BASO#: 0 10*3/uL (ref 0.0–0.2)
BASO%: 0.3 % (ref 0.0–2.0)
EOS%: 5.1 % (ref 0.0–7.0)
Eosinophils Absolute: 0.5 10*3/uL (ref 0.0–0.5)
HEMATOCRIT: 45.1 % (ref 38.7–49.9)
HGB: 15.6 g/dL (ref 13.0–17.1)
LYMPH#: 2.9 10*3/uL (ref 0.9–3.3)
LYMPH%: 29.7 % (ref 14.0–48.0)
MCH: 29.8 pg (ref 28.0–33.4)
MCHC: 34.6 g/dL (ref 32.0–35.9)
MCV: 86 fL (ref 82–98)
MONO#: 1.5 10*3/uL — ABNORMAL HIGH (ref 0.1–0.9)
MONO%: 15.2 % — ABNORMAL HIGH (ref 0.0–13.0)
NEUT#: 4.8 10*3/uL (ref 1.5–6.5)
NEUT%: 49.7 % (ref 40.0–80.0)
Platelets: 153 10*3/uL (ref 145–400)
RBC: 5.23 10*6/uL (ref 4.20–5.70)
RDW: 14.8 % (ref 11.1–15.7)
WBC: 9.6 10*3/uL (ref 4.0–10.0)

## 2014-03-17 LAB — PROTIME-INR (CHCC SATELLITE)
INR: 1.7 — ABNORMAL LOW (ref 2.0–3.5)
Protime: 20.4 Seconds — ABNORMAL HIGH (ref 10.6–13.4)

## 2014-03-17 NOTE — Progress Notes (Signed)
Hematology and Oncology Follow Up Visit  Mark [name] [id number] [birth year] 68 y.o. 03/17/2014   Principle Diagnosis:   Refractory immune thrombocytopenia. 2. 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 3. Positive lupus anticoagulant.  Current Therapy:   1. Coumadin to maintain INR between     2. Nplate as indicated for platelet count less than 100,000. 3. Decadron - the patient to take at his discretion.     Interim History:  Mr.  Mark [name] is back for followup. He is doing okay. He and his wife had a good Thanksgiving.  He's not doing as much physical therapy right now. The physical therapy certainly has helped him.  He is tolerating the Coumadin. He's had no bleeding. He does receive Nplate. Thankfully, he's not had to get any Nplate for several months. I think last him back at Nplate was back in May 2014.  He did get Rituxan back in 2013. I believe this helped with his immune thrombocytopenia.  When his platelet count gets down below 50,000, give him Decadron. I think the last time that he got Decadron may been back in April of this year.  His appetite has been quite good. He's had no nausea or vomiting.  He's had no cough. He's had no shortness of breath. He's had no headache. He's had no further weakness issues.  Medications: Current outpatient prescriptions: acetaminophen (TYLENOL) 500 MG tablet, Take 1,000 mg by mouth every 8 (eight) hours as needed., Disp: , Rfl: ;  alendronate (FOSAMAX) 70 MG tablet, Take 70 mg by mouth every 7 (seven) days. Take with a full glass of water on an empty stomach. , Disp: , Rfl: ;  amLODipine (NORVASC) 2.5 MG tablet, Take 5 mg by mouth daily. , Disp: , Rfl:  Cholecalciferol (VITAMIN D) 2000 UNITS tablet, Take 2,000 Units by mouth 2 (two) times daily., Disp: , Rfl: ;  dexamethasone (DECADRON) 4 MG tablet, Take 10 mg by mouth. PRN ONLY  TAKES 40 MG DAILY FOR 4 DAYS THEN STOP, Disp: , Rfl: ;  diphenhydrAMINE (BENADRYL) 25 MG  tablet, Take 25 mg by mouth every 6 (six) hours as needed (may take 1 to 2 tabs). , Disp: , Rfl:  famotidine (PEPCID) 40 MG tablet, Take 40 mg by mouth 2 (two) times daily as needed. , Disp: , Rfl: ;  levETIRAcetam (KEPPRA) 500 MG tablet, 500 mg. 3 tabs  in am and 3 tabs in pm, Disp: , Rfl: ;  Multiple Vitamins-Minerals (MULTIVITAMIN WITH MINERALS) tablet, Take 1 tablet by mouth daily. 100 mcg of vitamin K in this multi., Disp: , Rfl: ;  pyridOXINE (VITAMIN B-6) 100 MG tablet, Take 100 mg by mouth 2 (two) times daily., Disp: , Rfl:  rosuvastatin (CRESTOR) 20 MG tablet, Take 1 tablet (20 mg total) by mouth daily., Disp: 30 tablet, Rfl: 6;  sertraline (ZOLOFT) 100 MG tablet, Take 100 mg by mouth daily. 1 tab in AM and 1/2 tab in PM=150 mg daily, Disp: , Rfl: ;  Tamsulosin HCl (FLOMAX) 0.4 MG CAPS, Take 0.8 mg by mouth daily. @ CAP = 0.8 MG, Disp: , Rfl: ;  warfarin (COUMADIN) 2 MG tablet, Take 2 mg by mouth daily. As directed, Disp: , Rfl:   Allergies: No Known Allergies  Past Medical History, Surgical history, Social history, and Family History were reviewed and updated.  Review of Systems:  as above   Physical Exam:  height is 5\' 9"  (1.753 m) and weight is 204 lb (92.534 kg). His oral temperature  is 97.9 F (36.6 C). His blood pressure is 100/70 and his pulse is 76. His respiration is 18.    well-developed well-nourished white gentleman. Head and neck exam shows no ocular or oral reason. There is no adenopathy in the neck. Lungs are clear. Cardiac exam regular rate and rhythm with no murmurs, rubs or bruits. His abdomen is soft. There is no. fluid wave. There is no probable liver or spleen tip. Extremities shows no clubbing cyanosis or edema. He has chronic weakness in his left side. Skin exam shows no rashes, ecchymosis or petechia. Neurological exam shows a weakness on his left-sided, more so in the left arm then left leg.  Lab Results  Component Value Date   WBC 9.6 03/17/2014   HGB 15.6  03/17/2014   HCT 45.1 03/17/2014   MCV 86 03/17/2014   PLT 153 03/17/2014     Chemistry      Component Value Date/Time   NA 138 12/30/2013 1449   NA 134 01/18/2008 0810   K 4.2 12/30/2013 1449   K 4.3 01/18/2008 0810   CL 106 12/30/2013 1449   CL 100 01/18/2008 0810   CO2 24 12/30/2013 1449   CO2 24 01/18/2008 0810   BUN 21 12/30/2013 1449   BUN 37* 01/18/2008 0810   CREATININE 1.03 12/30/2013 1449   CREATININE 1.5* 01/18/2008 0810      Component Value Date/Time   CALCIUM 9.1 12/30/2013 1449   CALCIUM 8.7 01/18/2008 0810   ALKPHOS 53 12/30/2013 1449   AST 38* 12/30/2013 1449   ALT 70* 12/30/2013 1449   BILITOT 0.6 12/30/2013 1449         Impression and Plan: [name] is  68 year old gentleman with refractory immune thrombocytopenia. He also has a history of a thrombotic CVA. He's had residual left-sided weakness. This probably was back in October of 2012.    He is recovering slowly. He is really aggressive with to his therapy. He is trying his best. He is trying to play the guitar. 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 When his platelet count gets below 50,,000, he takes Decadron which always works  We will check his CBC and Coumadin now every 2 weeks. I think this is reasonable. I will see him back myself in about 2 months  Volanda Napoleon, MD 12/3/20155:04 PM

## 2014-03-31 ENCOUNTER — Other Ambulatory Visit: Payer: Medicare Other | Admitting: Lab

## 2014-03-31 ENCOUNTER — Telehealth: Payer: Self-pay | Admitting: Hematology & Oncology

## 2014-03-31 NOTE — Telephone Encounter (Signed)
Patient's wife called stating her husband was sick.  She canceled apt 03/31/14 and resch for 04/04/14

## 2014-04-04 ENCOUNTER — Other Ambulatory Visit: Payer: Medicare Other | Admitting: Lab

## 2014-04-12 ENCOUNTER — Other Ambulatory Visit (HOSPITAL_BASED_OUTPATIENT_CLINIC_OR_DEPARTMENT_OTHER): Payer: Medicare Other | Admitting: Lab

## 2014-04-12 DIAGNOSIS — I63031 Cerebral infarction due to thrombosis of right carotid artery: Secondary | ICD-10-CM

## 2014-04-12 DIAGNOSIS — D693 Immune thrombocytopenic purpura: Secondary | ICD-10-CM

## 2014-04-12 DIAGNOSIS — Z8673 Personal history of transient ischemic attack (TIA), and cerebral infarction without residual deficits: Secondary | ICD-10-CM

## 2014-04-12 LAB — CBC WITH DIFFERENTIAL (CANCER CENTER ONLY)
BASO#: 0 10*3/uL (ref 0.0–0.2)
BASO%: 0.3 % (ref 0.0–2.0)
EOS ABS: 0.5 10*3/uL (ref 0.0–0.5)
EOS%: 5 % (ref 0.0–7.0)
HCT: 43.5 % (ref 38.7–49.9)
HEMOGLOBIN: 15 g/dL (ref 13.0–17.1)
LYMPH#: 3.1 10*3/uL (ref 0.9–3.3)
LYMPH%: 30.1 % (ref 14.0–48.0)
MCH: 30.3 pg (ref 28.0–33.4)
MCHC: 34.5 g/dL (ref 32.0–35.9)
MCV: 88 fL (ref 82–98)
MONO#: 1.9 10*3/uL — ABNORMAL HIGH (ref 0.1–0.9)
MONO%: 18.7 % — AB (ref 0.0–13.0)
NEUT#: 4.8 10*3/uL (ref 1.5–6.5)
NEUT%: 45.9 % (ref 40.0–80.0)
Platelets: 133 10*3/uL — ABNORMAL LOW (ref 145–400)
RBC: 4.95 10*6/uL (ref 4.20–5.70)
RDW: 14.9 % (ref 11.1–15.7)
WBC: 10.4 10*3/uL — AB (ref 4.0–10.0)

## 2014-04-12 LAB — PROTIME-INR (CHCC SATELLITE)
INR: 3.8 — AB (ref 2.0–3.5)
PROTIME: 45.6 s — AB (ref 10.6–13.4)

## 2014-04-14 ENCOUNTER — Other Ambulatory Visit: Payer: Self-pay | Admitting: Hematology & Oncology

## 2014-04-14 ENCOUNTER — Other Ambulatory Visit: Payer: Medicare Other | Admitting: Lab

## 2014-04-18 ENCOUNTER — Other Ambulatory Visit: Payer: Self-pay | Admitting: *Deleted

## 2014-04-28 ENCOUNTER — Other Ambulatory Visit (HOSPITAL_BASED_OUTPATIENT_CLINIC_OR_DEPARTMENT_OTHER): Payer: Commercial Managed Care - HMO | Admitting: Lab

## 2014-04-28 ENCOUNTER — Ambulatory Visit (HOSPITAL_BASED_OUTPATIENT_CLINIC_OR_DEPARTMENT_OTHER)
Admission: RE | Admit: 2014-04-28 | Discharge: 2014-04-28 | Disposition: A | Discharge status: Home / Self Care | Payer: Commercial Managed Care - HMO | Source: Ambulatory Visit | Attending: Hematology & Oncology | Admitting: Hematology & Oncology

## 2014-04-28 ENCOUNTER — Encounter: Payer: Self-pay | Admitting: Hematology & Oncology

## 2014-04-28 ENCOUNTER — Ambulatory Visit (HOSPITAL_BASED_OUTPATIENT_CLINIC_OR_DEPARTMENT_OTHER): Payer: Commercial Managed Care - HMO | Admitting: Hematology & Oncology

## 2014-04-28 VITALS — BP 145/73 | HR 67 | Temp 98.4°F | Resp 18 | Ht 69.0 in

## 2014-04-28 DIAGNOSIS — I63031 Cerebral infarction due to thrombosis of right carotid artery: Secondary | ICD-10-CM

## 2014-04-28 DIAGNOSIS — S34119A Complete lesion of unspecified level of lumbar spinal cord, initial encounter: Principal | ICD-10-CM

## 2014-04-28 DIAGNOSIS — Z8673 Personal history of transient ischemic attack (TIA), and cerebral infarction without residual deficits: Secondary | ICD-10-CM

## 2014-04-28 DIAGNOSIS — S32009A Unspecified fracture of unspecified lumbar vertebra, initial encounter for closed fracture: Secondary | ICD-10-CM

## 2014-04-28 DIAGNOSIS — M545 Low back pain: Secondary | ICD-10-CM | POA: Diagnosis not present

## 2014-04-28 DIAGNOSIS — D693 Immune thrombocytopenic purpura: Secondary | ICD-10-CM

## 2014-04-28 DIAGNOSIS — G8929 Other chronic pain: Secondary | ICD-10-CM | POA: Insufficient documentation

## 2014-04-28 LAB — CBC WITH DIFFERENTIAL (CANCER CENTER ONLY)
BASO#: 0.1 10*3/uL (ref 0.0–0.2)
BASO%: 0.5 % (ref 0.0–2.0)
EOS%: 2.9 % (ref 0.0–7.0)
Eosinophils Absolute: 0.3 10*3/uL (ref 0.0–0.5)
HCT: 46 % (ref 38.7–49.9)
HGB: 15.5 g/dL (ref 13.0–17.1)
LYMPH#: 2.9 10*3/uL (ref 0.9–3.3)
LYMPH%: 26.4 % (ref 14.0–48.0)
MCH: 30.1 pg (ref 28.0–33.4)
MCHC: 33.7 g/dL (ref 32.0–35.9)
MCV: 89 fL (ref 82–98)
MONO#: 2.2 10*3/uL — AB (ref 0.1–0.9)
MONO%: 19.6 % — AB (ref 0.0–13.0)
NEUT#: 5.6 10*3/uL (ref 1.5–6.5)
NEUT%: 50.6 % (ref 40.0–80.0)
Platelets: 180 10*3/uL (ref 145–400)
RBC: 5.15 10*6/uL (ref 4.20–5.70)
RDW: 15.5 % (ref 11.1–15.7)
WBC: 11 10*3/uL — ABNORMAL HIGH (ref 4.0–10.0)

## 2014-04-28 LAB — PROTIME-INR (CHCC SATELLITE)
INR: 2.6 (ref 2.0–3.5)
Protime: 31.2 Seconds — ABNORMAL HIGH (ref 10.6–13.4)

## 2014-04-28 MED ORDER — TRAMADOL HCL 50 MG PO TABS: 50.0000 mg | ORAL_TABLET | Freq: Four times a day (QID) | ORAL | Status: DC | PRN

## 2014-04-28 NOTE — Progress Notes (Signed)
Hematology and Oncology Follow Up Visit  WALLIS VANCOTT [id number] 1946/04/13 69 y.o. 04/28/2014   Principle Diagnosis:   Refractory immune thrombocytopenia. 2. 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 3. Positive lupus anticoagulant.  Current Therapy:   1. Coumadin to maintain INR between     2. Nplate as indicated for platelet count less than 100,000. 3. Decadron - the patient to take at his discretion.     Interim History:  Mr.  Nuzum is back for followup. He is doing okay. He and his wife had a good Christmas.  His back is hurting him quite a bit. He has had compression fractures in the past. He is on Fosamax. I suspect he may have another compression fracture. We will get some spinal films on him.  He's had no change in bowel or bladder habits. He's had no nausea or vomiting. He's had no bleeding. He's had no leg swelling. Maybe some slight chronic swelling in the left lower leg.  There's been no headache. He's had no visual symptoms.  Medications:  Current outpatient prescriptions:  .  acetaminophen (TYLENOL) 500 MG tablet, Take 1,000 mg by mouth every 8 (eight) hours as needed., Disp: , Rfl:  .  alendronate (FOSAMAX) 70 MG tablet, Take 70 mg by mouth every 7 (seven) days. Take with a full glass of water on an empty stomach. , Disp: , Rfl:  .  amLODipine (NORVASC) 2.5 MG tablet, Take 5 mg by mouth daily. , Disp: , Rfl:  .  amoxicillin (AMOXIL) 500 MG capsule, Take 500 mg by mouth 2 (two) times daily. Started on 1-13-----21--16, Disp: , Rfl:  .  Cholecalciferol (VITAMIN D) 2000 UNITS tablet, Take 2,000 Units by mouth 2 (two) times daily. 4,000 units, Disp: , Rfl:  .  dexamethasone (DECADRON) 4 MG tablet, Take 10 mg by mouth. PRN ONLY  TAKES 40 MG DAILY FOR 4 DAYS THEN STOP, Disp: , Rfl:  .  diphenhydrAMINE (BENADRYL) 25 MG tablet, Take 25 mg by mouth every 6 (six) hours as needed (may take 1 to 2 tabs). , Disp: , Rfl:  .  famotidine (PEPCID) 40 MG  tablet, Take 40 mg by mouth 2 (two) times daily as needed. , Disp: , Rfl:  .  levETIRAcetam (KEPPRA) 500 MG tablet, 500 mg. 3 tabs  in am and 3 tabs in pm, Disp: , Rfl:  .  Multiple Vitamins-Minerals (MULTIVITAMIN WITH MINERALS) tablet, Take 1 tablet by mouth daily. 100 mcg of vitamin K in this multi., Disp: , Rfl:  .  pyridOXINE (VITAMIN B-6) 100 MG tablet, Take 100 mg by mouth 2 (two) times daily., Disp: , Rfl:  .  rosuvastatin (CRESTOR) 20 MG tablet, Take 1 tablet (20 mg total) by mouth daily., Disp: 30 tablet, Rfl: 6 .  sertraline (ZOLOFT) 100 MG tablet, Take 100 mg by mouth daily. 1 tab in AM and 1/2 tab in PM=150 mg daily, Disp: , Rfl:  .  Tamsulosin HCl (FLOMAX) 0.4 MG CAPS, Take 0.8 mg by mouth daily. @ CAP = 0.8 MG, Disp: , Rfl:  .  warfarin (COUMADIN) 2 MG tablet, TAKE 4 MG ON FIRST DAY, 2 MG ON SECOND DAY. REPEAT THIS PATTERN UNTIL DOCTOR CHANGES., Disp: 90 tablet, Rfl: 0 .  traMADol (ULTRAM) 50 MG tablet, Take 1 tablet (50 mg total) by mouth every 6 (six) hours as needed., Disp: 60 tablet, Rfl: 2  Allergies: No Known Allergies  Past Medical History, Surgical history, Social history, and Family History were reviewed  and updated.  Review of Systems:  as above   Physical Exam:  height is 5\' 9"  (1.753 m). His oral temperature is 98.4 F (36.9 C). His blood pressure is 145/73 and his pulse is 67. His respiration is 18.    well-developed well-nourished white gentleman. Head and neck exam shows no ocular or oral reason. There is no adenopathy in the neck. Lungs are clear. Cardiac exam regular rate and rhythm with no murmurs, rubs or bruits. His abdomen is soft. There is no. fluid wave. There is no probable liver or spleen tip. Extremities shows no clubbing cyanosis or edema. He has chronic weakness in his left side. Skin exam shows no rashes, ecchymosis or petechia. Neurological exam shows a weakness on his left-sided, more so in the left arm then left leg.  Lab Results  Component Value  Date   WBC 11.0* 04/28/2014   HGB 15.5 04/28/2014   HCT 46.0 04/28/2014   MCV 89 04/28/2014   PLT 180 04/28/2014     Chemistry      Component Value Date/Time   NA 138 12/30/2013 1449   NA 134 01/18/2008 0810   K 4.2 12/30/2013 1449   K 4.3 01/18/2008 0810   CL 106 12/30/2013 1449   CL 100 01/18/2008 0810   CO2 24 12/30/2013 1449   CO2 24 01/18/2008 0810   BUN 21 12/30/2013 1449   BUN 37* 01/18/2008 0810   CREATININE 1.03 12/30/2013 1449   CREATININE 1.5* 01/18/2008 0810      Component Value Date/Time   CALCIUM 9.1 12/30/2013 1449   CALCIUM 8.7 01/18/2008 0810   ALKPHOS 53 12/30/2013 1449   AST 38* 12/30/2013 1449   ALT 70* 12/30/2013 1449   BILITOT 0.6 12/30/2013 1449         Impression and Plan: Mr. Butner is  69 year old gentleman with refractory immune thrombocytopenia. He also has a history of a thrombotic CVA. He's had residual left-sided weakness. This probably was back in October of 2012.    He is recovering slowly. He is really aggressive with to his therapy. He is trying his best. He is trying to play the guitar. 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 When his platelet count gets below 50,,000, he takes Decadron which always works  We will check his CBC and Coumadin now every 2 weeks. I think this is reasonable. I will see him back myself in about 2 months  Volanda Napoleon, MD 1/14/20164:54 PM

## 2014-05-02 ENCOUNTER — Telehealth: Payer: Self-pay | Admitting: *Deleted

## 2014-05-02 NOTE — Telephone Encounter (Addendum)
Reviewed results with patient and his wife.  ----- Message from Volanda Napoleon, MD sent at 04/29/2014  4:10 PM EST ----- Please call and tell him that there is a stable compression fracture at L5. everything else looks okay. If the pain continues, we can always consider doing a cement injection into that spinal bone like he has had before in the lower thoracic spine. Thanks

## 2014-05-05 ENCOUNTER — Other Ambulatory Visit: Payer: Medicare Other | Admitting: Lab

## 2014-05-12 ENCOUNTER — Encounter: Payer: Self-pay | Admitting: *Deleted

## 2014-05-12 ENCOUNTER — Other Ambulatory Visit (HOSPITAL_BASED_OUTPATIENT_CLINIC_OR_DEPARTMENT_OTHER): Payer: Commercial Managed Care - HMO | Admitting: Lab

## 2014-05-12 DIAGNOSIS — Z8673 Personal history of transient ischemic attack (TIA), and cerebral infarction without residual deficits: Secondary | ICD-10-CM

## 2014-05-12 DIAGNOSIS — I63031 Cerebral infarction due to thrombosis of right carotid artery: Secondary | ICD-10-CM

## 2014-05-12 DIAGNOSIS — Z7901 Long term (current) use of anticoagulants: Secondary | ICD-10-CM

## 2014-05-12 LAB — CBC WITH DIFFERENTIAL (CANCER CENTER ONLY)
BASO#: 0 10*3/uL (ref 0.0–0.2)
BASO%: 0.2 % (ref 0.0–2.0)
EOS ABS: 0.3 10*3/uL (ref 0.0–0.5)
EOS%: 3.7 % (ref 0.0–7.0)
HCT: 44.4 % (ref 38.7–49.9)
HGB: 15 g/dL (ref 13.0–17.1)
LYMPH#: 2.8 10*3/uL (ref 0.9–3.3)
LYMPH%: 31.7 % (ref 14.0–48.0)
MCH: 29.6 pg (ref 28.0–33.4)
MCHC: 33.8 g/dL (ref 32.0–35.9)
MCV: 88 fL (ref 82–98)
MONO#: 1.6 10*3/uL — AB (ref 0.1–0.9)
MONO%: 18.9 % — ABNORMAL HIGH (ref 0.0–13.0)
NEUT%: 45.5 % (ref 40.0–80.0)
NEUTROS ABS: 4 10*3/uL (ref 1.5–6.5)
PLATELETS: 139 10*3/uL — AB (ref 145–400)
RBC: 5.06 10*6/uL (ref 4.20–5.70)
RDW: 15.1 % (ref 11.1–15.7)
WBC: 8.7 10*3/uL (ref 4.0–10.0)

## 2014-05-12 LAB — PROTIME-INR (CHCC SATELLITE)
INR: 3.2 (ref 2.0–3.5)
Protime: 38.4 Seconds — ABNORMAL HIGH (ref 10.6–13.4)

## 2014-05-17 ENCOUNTER — Other Ambulatory Visit: Payer: Self-pay | Admitting: Family

## 2014-05-19 ENCOUNTER — Other Ambulatory Visit: Payer: Medicare Other | Admitting: Lab

## 2014-05-26 ENCOUNTER — Other Ambulatory Visit (HOSPITAL_BASED_OUTPATIENT_CLINIC_OR_DEPARTMENT_OTHER): Payer: Commercial Managed Care - HMO | Admitting: Lab

## 2014-05-26 DIAGNOSIS — I63031 Cerebral infarction due to thrombosis of right carotid artery: Secondary | ICD-10-CM

## 2014-05-26 DIAGNOSIS — Z8673 Personal history of transient ischemic attack (TIA), and cerebral infarction without residual deficits: Secondary | ICD-10-CM

## 2014-05-26 LAB — CBC WITH DIFFERENTIAL (CANCER CENTER ONLY)
BASO#: 0 10*3/uL (ref 0.0–0.2)
BASO%: 0.3 % (ref 0.0–2.0)
EOS%: 5.2 % (ref 0.0–7.0)
Eosinophils Absolute: 0.5 10*3/uL (ref 0.0–0.5)
HEMATOCRIT: 44.7 % (ref 38.7–49.9)
HGB: 15 g/dL (ref 13.0–17.1)
LYMPH#: 2.7 10*3/uL (ref 0.9–3.3)
LYMPH%: 30.1 % (ref 14.0–48.0)
MCH: 29.5 pg (ref 28.0–33.4)
MCHC: 33.6 g/dL (ref 32.0–35.9)
MCV: 88 fL (ref 82–98)
MONO#: 1.6 10*3/uL — AB (ref 0.1–0.9)
MONO%: 17.8 % — AB (ref 0.0–13.0)
NEUT#: 4.2 10*3/uL (ref 1.5–6.5)
NEUT%: 46.6 % (ref 40.0–80.0)
PLATELETS: 139 10*3/uL — AB (ref 145–400)
RBC: 5.08 10*6/uL (ref 4.20–5.70)
RDW: 14.9 % (ref 11.1–15.7)
WBC: 9.1 10*3/uL (ref 4.0–10.0)

## 2014-05-26 LAB — PROTIME-INR (CHCC SATELLITE)
INR: 3.5 (ref 2.0–3.5)
PROTIME: 42 s — AB (ref 10.6–13.4)

## 2014-06-02 ENCOUNTER — Other Ambulatory Visit: Payer: Medicare Other | Admitting: Lab

## 2014-06-09 ENCOUNTER — Ambulatory Visit (HOSPITAL_BASED_OUTPATIENT_CLINIC_OR_DEPARTMENT_OTHER): Payer: Commercial Managed Care - HMO | Admitting: Hematology & Oncology

## 2014-06-09 ENCOUNTER — Encounter: Payer: Self-pay | Admitting: Hematology & Oncology

## 2014-06-09 ENCOUNTER — Other Ambulatory Visit (HOSPITAL_BASED_OUTPATIENT_CLINIC_OR_DEPARTMENT_OTHER): Payer: Commercial Managed Care - HMO | Admitting: Lab

## 2014-06-09 VITALS — BP 106/61 | HR 71 | Temp 98.5°F | Resp 18 | Ht 69.0 in | Wt 198.0 lb

## 2014-06-09 DIAGNOSIS — Z7901 Long term (current) use of anticoagulants: Secondary | ICD-10-CM

## 2014-06-09 DIAGNOSIS — Z8673 Personal history of transient ischemic attack (TIA), and cerebral infarction without residual deficits: Secondary | ICD-10-CM

## 2014-06-09 DIAGNOSIS — I63031 Cerebral infarction due to thrombosis of right carotid artery: Secondary | ICD-10-CM

## 2014-06-09 DIAGNOSIS — D693 Immune thrombocytopenic purpura: Secondary | ICD-10-CM

## 2014-06-09 LAB — PROTIME-INR (CHCC SATELLITE)
INR: 2.3 (ref 2.0–3.5)
PROTIME: 27.6 s — AB (ref 10.6–13.4)

## 2014-06-09 LAB — CBC WITH DIFFERENTIAL (CANCER CENTER ONLY)
BASO#: 0 10*3/uL (ref 0.0–0.2)
BASO%: 0.5 % (ref 0.0–2.0)
EOS%: 4 % (ref 0.0–7.0)
Eosinophils Absolute: 0.4 10*3/uL (ref 0.0–0.5)
HCT: 44.8 % (ref 38.7–49.9)
HGB: 15.2 g/dL (ref 13.0–17.1)
LYMPH#: 2.7 10*3/uL (ref 0.9–3.3)
LYMPH%: 30.3 % (ref 14.0–48.0)
MCH: 29.7 pg (ref 28.0–33.4)
MCHC: 33.9 g/dL (ref 32.0–35.9)
MCV: 88 fL (ref 82–98)
MONO#: 1.7 10*3/uL — AB (ref 0.1–0.9)
MONO%: 19.7 % — AB (ref 0.0–13.0)
NEUT%: 45.5 % (ref 40.0–80.0)
NEUTROS ABS: 4 10*3/uL (ref 1.5–6.5)
Platelets: 138 10*3/uL — ABNORMAL LOW (ref 145–400)
RBC: 5.11 10*6/uL (ref 4.20–5.70)
RDW: 14.6 % (ref 11.1–15.7)
WBC: 8.8 10*3/uL (ref 4.0–10.0)

## 2014-06-09 NOTE — Progress Notes (Signed)
Hematology and Oncology Follow Up Visit  Mark [name] [id number] Nov 30, 1945 69 y.o. 06/09/2014   Principle Diagnosis:  1. Refractory immune thrombocytopenia. 2. 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 3. Positive lupus anticoagulant.  Current Therapy:   1. Coumadin to maintain INR between     2. Nplate as indicated for platelet count less than 100,000. 3. Decadron - the patient to take at his discretion.     Interim History:  Mr.  [name] is back for followup. He is doing better. His back is not bothering him as much. We did do some plain x-rays back in January. He had a stable partial compression fracture at L5.  He's had approximately bleeding. He's on Coumadin.  We have not had to give him Nplate or Decadron for many months as a plate count has been holding steady.  He is still do some therapy. He is slowly improving his function on the left side. There's been no headache. He's had no visual symptoms.  He's had no problems with bowels or bladder.  Medications:  Current outpatient prescriptions:  .  acetaminophen (TYLENOL) 500 MG tablet, Take 1,000 mg by mouth every 8 (eight) hours as needed., Disp: , Rfl:  .  alendronate (FOSAMAX) 70 MG tablet, Take 70 mg by mouth every 7 (seven) days. Take with a full glass of water on an empty stomach. , Disp: , Rfl:  .  amantadine (SYMMETREL) 100 MG capsule, Take 100 mg by mouth daily., Disp: , Rfl:  .  amLODipine (NORVASC) 2.5 MG tablet, Take 5 mg by mouth daily. , Disp: , Rfl:  .  Cholecalciferol (VITAMIN D) 2000 UNITS tablet, Take 2,000 Units by mouth 2 (two) times daily. 4,000 units, Disp: , Rfl:  .  dexamethasone (DECADRON) 4 MG tablet, Take 10 mg by mouth. PRN ONLY  TAKES 40 MG DAILY FOR 4 DAYS THEN STOP, Disp: , Rfl:  .  diphenhydrAMINE (BENADRYL) 25 MG tablet, Take 25 mg by mouth every 6 (six) hours as needed (may take 1 to 2 tabs). , Disp: , Rfl:  .  famotidine (PEPCID) 40 MG tablet, Take 40 mg by mouth 2  (two) times daily as needed. , Disp: , Rfl:  .  levETIRAcetam (KEPPRA) 500 MG tablet, 500 mg. 3 tabs  in am and 3 tabs in pm, Disp: , Rfl:  .  Multiple Vitamins-Minerals (MULTIVITAMIN WITH MINERALS) tablet, Take 1 tablet by mouth daily. 100 mcg of vitamin K in this multi., Disp: , Rfl:  .  pyridOXINE (VITAMIN B-6) 100 MG tablet, Take 100 mg by mouth 2 (two) times daily., Disp: , Rfl:  .  rosuvastatin (CRESTOR) 20 MG tablet, Take 1 tablet (20 mg total) by mouth daily., Disp: 30 tablet, Rfl: 6 .  Tamsulosin HCl (FLOMAX) 0.4 MG CAPS, Take 0.8 mg by mouth daily. @ CAP = 0.8 MG, Disp: , Rfl:  .  traMADol (ULTRAM) 50 MG tablet, Take 1 tablet (50 mg total) by mouth every 6 (six) hours as needed., Disp: 60 tablet, Rfl: 2 .  warfarin (COUMADIN) 2 MG tablet, TAKE 4 MG ON FIRST DAY, 2 MG ON SECOND DAY. REPEAT THIS PATTERN UNTIL DOCTOR CHANGES., Disp: 90 tablet, Rfl: 0 .  sertraline (ZOLOFT) 100 MG tablet, Take 100 mg by mouth daily. 1 tab in AM and 1/2 tab in PM=150 mg daily, Disp: , Rfl:   Allergies: No Known Allergies  Past Medical History, Surgical history, Social history, and Family History were reviewed and updated.  Review of  Systems:  as above   Physical Exam:  height is 5\' 9"  (1.753 m) and weight is 198 lb (89.812 kg). His oral temperature is 98.5 F (36.9 C). His blood pressure is 106/61 and his pulse is 71. His respiration is 18.    well-developed well-nourished white gentleman. Head and neck exam shows no ocular or oral reason. There is no adenopathy in the neck. Lungs are clear. Cardiac exam regular rate and rhythm with no murmurs, rubs or bruits. His abdomen is soft. There is no. fluid wave. There is no probable liver or spleen tip. Extremities shows no clubbing cyanosis or edema. He has chronic weakness in his left side. Skin exam shows no rashes, ecchymosis or petechia. Neurological exam shows a weakness on his left-sided, more so in the left arm then left leg.  Lab Results  Component  Value Date   WBC 8.8 06/09/2014   HGB 15.2 06/09/2014   HCT 44.8 06/09/2014   MCV 88 06/09/2014   PLT 138* 06/09/2014     Chemistry      Component Value Date/Time   NA 138 12/30/2013 1449   NA 134 01/18/2008 0810   K 4.2 12/30/2013 1449   K 4.3 01/18/2008 0810   CL 106 12/30/2013 1449   CL 100 01/18/2008 0810   CO2 24 12/30/2013 1449   CO2 24 01/18/2008 0810   BUN 21 12/30/2013 1449   BUN 37* 01/18/2008 0810   CREATININE 1.03 12/30/2013 1449   CREATININE 1.5* 01/18/2008 0810      Component Value Date/Time   CALCIUM 9.1 12/30/2013 1449   CALCIUM 8.7 01/18/2008 0810   ALKPHOS 53 12/30/2013 1449   AST 38* 12/30/2013 1449   ALT 70* 12/30/2013 1449   BILITOT 0.6 12/30/2013 1449         Impression and Plan: Mark [name] is  69 year old gentleman with refractory immune thrombocytopenia. He also has a history of a thrombotic CVA. He's had residual left-sided weakness. This probably was back in October of 2012.    He is recovering slowly. He is continuing with to his therapy. He is trying his best. He is trying to play the guitar. 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 When his platelet count gets below 50,,000, he takes Decadron which always works  We will check his CBC and Coumadin now every 2 weeks. I think this is reasonable. I will see him back myself in about 2 months. If all is good in 2 months, then maybe we can move his lab appointments out every 3 weeks.  Volanda Napoleon, MD 2/25/20163:45 PM

## 2014-06-19 ENCOUNTER — Other Ambulatory Visit: Payer: Self-pay | Admitting: Hematology & Oncology

## 2014-06-20 ENCOUNTER — Telehealth: Payer: Self-pay | Admitting: Hematology & Oncology

## 2014-06-20 NOTE — Telephone Encounter (Signed)
Brien Few: [id number] Requesting Provider: Dr. Harlan Stains Treating Provider: Dr. Burney Gauze Visits: 6 Status: Approved Dates: 06/17/2014 - 12/14/2014        COPY SCANNED

## 2014-06-23 ENCOUNTER — Encounter (INDEPENDENT_AMBULATORY_CARE_PROVIDER_SITE_OTHER): Payer: Self-pay

## 2014-06-23 ENCOUNTER — Other Ambulatory Visit (HOSPITAL_BASED_OUTPATIENT_CLINIC_OR_DEPARTMENT_OTHER): Payer: Commercial Managed Care - HMO | Admitting: Lab

## 2014-06-23 DIAGNOSIS — I63031 Cerebral infarction due to thrombosis of right carotid artery: Secondary | ICD-10-CM

## 2014-06-23 DIAGNOSIS — Z8673 Personal history of transient ischemic attack (TIA), and cerebral infarction without residual deficits: Secondary | ICD-10-CM

## 2014-06-23 DIAGNOSIS — D693 Immune thrombocytopenic purpura: Secondary | ICD-10-CM

## 2014-06-23 LAB — CBC WITH DIFFERENTIAL (CANCER CENTER ONLY)
BASO#: 0.1 10*3/uL (ref 0.0–0.2)
BASO%: 0.4 % (ref 0.0–2.0)
EOS%: 2.6 % (ref 0.0–7.0)
Eosinophils Absolute: 0.3 10*3/uL (ref 0.0–0.5)
HCT: 44.6 % (ref 38.7–49.9)
HGB: 15.2 g/dL (ref 13.0–17.1)
LYMPH#: 2.5 10*3/uL (ref 0.9–3.3)
LYMPH%: 20.8 % (ref 14.0–48.0)
MCH: 30 pg (ref 28.0–33.4)
MCHC: 34.1 g/dL (ref 32.0–35.9)
MCV: 88 fL (ref 82–98)
MONO#: 2 10*3/uL — AB (ref 0.1–0.9)
MONO%: 16.6 % — AB (ref 0.0–13.0)
NEUT%: 59.6 % (ref 40.0–80.0)
NEUTROS ABS: 7.1 10*3/uL — AB (ref 1.5–6.5)
Platelets: 151 10*3/uL (ref 145–400)
RBC: 5.06 10*6/uL (ref 4.20–5.70)
RDW: 15.1 % (ref 11.1–15.7)
WBC: 11.9 10*3/uL — ABNORMAL HIGH (ref 4.0–10.0)

## 2014-06-23 LAB — PROTIME-INR (CHCC SATELLITE)
INR: 3 (ref 2.0–3.5)
Protime: 36 Seconds — ABNORMAL HIGH (ref 10.6–13.4)

## 2014-07-07 ENCOUNTER — Other Ambulatory Visit: Payer: Commercial Managed Care - HMO

## 2014-07-21 ENCOUNTER — Other Ambulatory Visit: Payer: Self-pay | Admitting: Family

## 2014-07-21 ENCOUNTER — Encounter (HOSPITAL_BASED_OUTPATIENT_CLINIC_OR_DEPARTMENT_OTHER): Payer: Commercial Managed Care - HMO | Admitting: Nurse Practitioner

## 2014-07-21 DIAGNOSIS — Z8673 Personal history of transient ischemic attack (TIA), and cerebral infarction without residual deficits: Secondary | ICD-10-CM

## 2014-07-21 DIAGNOSIS — Z7901 Long term (current) use of anticoagulants: Secondary | ICD-10-CM | POA: Diagnosis not present

## 2014-07-21 DIAGNOSIS — I63031 Cerebral infarction due to thrombosis of right carotid artery: Secondary | ICD-10-CM

## 2014-07-21 LAB — CBC WITH DIFFERENTIAL (CANCER CENTER ONLY)
BASO#: 0 10*3/uL (ref 0.0–0.2)
BASO%: 0 % (ref 0.0–2.0)
EOS%: 0 % (ref 0.0–7.0)
Eosinophils Absolute: 0 10*3/uL (ref 0.0–0.5)
HCT: 41.5 % (ref 38.7–49.9)
HGB: 14.1 g/dL (ref 13.0–17.1)
LYMPH#: 1.3 10*3/uL (ref 0.9–3.3)
LYMPH%: 5.4 % — AB (ref 14.0–48.0)
MCH: 29.7 pg (ref 28.0–33.4)
MCHC: 34 g/dL (ref 32.0–35.9)
MCV: 87 fL (ref 82–98)
MONO#: 1.4 10*3/uL — ABNORMAL HIGH (ref 0.1–0.9)
MONO%: 5.6 % (ref 0.0–13.0)
NEUT%: 89 % — ABNORMAL HIGH (ref 40.0–80.0)
NEUTROS ABS: 21.9 10*3/uL — AB (ref 1.5–6.5)
PLATELETS: 102 10*3/uL — AB (ref 145–400)
RBC: 4.75 10*6/uL (ref 4.20–5.70)
RDW: 15.3 % (ref 11.1–15.7)
WBC: 24.6 10*3/uL — ABNORMAL HIGH (ref 4.0–10.0)

## 2014-07-21 LAB — PROTIME-INR (CHCC SATELLITE)
INR: 4.2 — ABNORMAL HIGH (ref 2.0–3.5)
PROTIME: 50.4 s — AB (ref 10.6–13.4)

## 2014-07-21 NOTE — Progress Notes (Signed)
Mr. Carlyon' INR today is elevated at 4.2. He is currently on Coumadin 2 mg 1st day 4 mg 2nd day and then repeat. His wife noticed some petechia around his ankles so he is taking Decadron. This is day 3 of 4, 10 mg/day. His platelet count is 102. He has had no episodes of bleeding. He recently finished an antibiotic for a sinus infection. He has already taken his Coumadin for today.  After speaking with our pharmacist, it was decided that he will hold his coumadin tomorrow and Saturday and then resume on Sunday.  He has an appointment with [place] on April 21st and will keep that. He will call us with any questions or concerns. We can certainly see him sooner if need be.

## 2014-07-21 NOTE — Progress Notes (Signed)
error 

## 2014-08-04 ENCOUNTER — Telehealth: Payer: Self-pay | Admitting: Hematology & Oncology

## 2014-08-04 ENCOUNTER — Ambulatory Visit: Payer: Medicare Other | Admitting: Hematology & Oncology

## 2014-08-04 ENCOUNTER — Other Ambulatory Visit (HOSPITAL_BASED_OUTPATIENT_CLINIC_OR_DEPARTMENT_OTHER): Payer: Commercial Managed Care - HMO

## 2014-08-04 DIAGNOSIS — Z7901 Long term (current) use of anticoagulants: Secondary | ICD-10-CM | POA: Diagnosis not present

## 2014-08-04 DIAGNOSIS — D693 Immune thrombocytopenic purpura: Secondary | ICD-10-CM

## 2014-08-04 DIAGNOSIS — I63031 Cerebral infarction due to thrombosis of right carotid artery: Secondary | ICD-10-CM

## 2014-08-04 LAB — CBC WITH DIFFERENTIAL (CANCER CENTER ONLY)
BASO#: 0.1 10*3/uL (ref 0.0–0.2)
BASO%: 0.5 % (ref 0.0–2.0)
EOS ABS: 0.7 10*3/uL — AB (ref 0.0–0.5)
EOS%: 6 % (ref 0.0–7.0)
HEMATOCRIT: 43.2 % (ref 38.7–49.9)
HGB: 14.6 g/dL (ref 13.0–17.1)
LYMPH#: 2.4 10*3/uL (ref 0.9–3.3)
LYMPH%: 20.9 % (ref 14.0–48.0)
MCH: 29.9 pg (ref 28.0–33.4)
MCHC: 33.8 g/dL (ref 32.0–35.9)
MCV: 88 fL (ref 82–98)
MONO#: 1.8 10*3/uL — AB (ref 0.1–0.9)
MONO%: 16 % — AB (ref 0.0–13.0)
NEUT#: 6.4 10*3/uL (ref 1.5–6.5)
NEUT%: 56.6 % (ref 40.0–80.0)
Platelets: 212 10*3/uL (ref 145–400)
RBC: 4.89 10*6/uL (ref 4.20–5.70)
RDW: 15 % (ref 11.1–15.7)
WBC: 11.3 10*3/uL — AB (ref 4.0–10.0)

## 2014-08-04 LAB — PROTIME-INR (CHCC SATELLITE)
INR: 3.6 — ABNORMAL HIGH (ref 2.0–3.5)
Protime: 43.2 Seconds — ABNORMAL HIGH (ref 10.6–13.4)

## 2014-08-04 NOTE — Telephone Encounter (Signed)
Patient came in for lab and cx md apt and resch for 08/18/14.  rn Ginger was notified of cx Md apt

## 2014-08-05 ENCOUNTER — Telehealth: Payer: Self-pay | Admitting: *Deleted

## 2014-08-05 NOTE — Telephone Encounter (Addendum)
Patient aware of results.  ----- Message from Volanda Napoleon, MD sent at 08/05/2014  7:28 AM EDT ----- Call - INR is ok.  Platelets are ok!!  Mark [name]

## 2014-08-15 ENCOUNTER — Other Ambulatory Visit: Payer: Self-pay | Admitting: Hematology & Oncology

## 2014-08-18 ENCOUNTER — Other Ambulatory Visit (HOSPITAL_BASED_OUTPATIENT_CLINIC_OR_DEPARTMENT_OTHER): Payer: Commercial Managed Care - HMO

## 2014-08-18 ENCOUNTER — Encounter: Payer: Self-pay | Admitting: Hematology & Oncology

## 2014-08-18 ENCOUNTER — Ambulatory Visit (HOSPITAL_BASED_OUTPATIENT_CLINIC_OR_DEPARTMENT_OTHER): Payer: Commercial Managed Care - HMO | Admitting: Hematology & Oncology

## 2014-08-18 VITALS — BP 128/82 | HR 66 | Temp 98.3°F | Resp 18 | Ht 69.0 in | Wt 197.0 lb

## 2014-08-18 DIAGNOSIS — I63031 Cerebral infarction due to thrombosis of right carotid artery: Secondary | ICD-10-CM

## 2014-08-18 DIAGNOSIS — D693 Immune thrombocytopenic purpura: Secondary | ICD-10-CM | POA: Diagnosis not present

## 2014-08-18 DIAGNOSIS — Z8673 Personal history of transient ischemic attack (TIA), and cerebral infarction without residual deficits: Secondary | ICD-10-CM | POA: Diagnosis not present

## 2014-08-18 LAB — CBC WITH DIFFERENTIAL (CANCER CENTER ONLY)
BASO#: 0 10*3/uL (ref 0.0–0.2)
BASO%: 0.4 % (ref 0.0–2.0)
EOS ABS: 0.9 10*3/uL — AB (ref 0.0–0.5)
EOS%: 9.3 % — ABNORMAL HIGH (ref 0.0–7.0)
HCT: 43.4 % (ref 38.7–49.9)
HGB: 14.7 g/dL (ref 13.0–17.1)
LYMPH#: 2.8 10*3/uL (ref 0.9–3.3)
LYMPH%: 30.2 % (ref 14.0–48.0)
MCH: 29.9 pg (ref 28.0–33.4)
MCHC: 33.9 g/dL (ref 32.0–35.9)
MCV: 88 fL (ref 82–98)
MONO#: 1.8 10*3/uL — ABNORMAL HIGH (ref 0.1–0.9)
MONO%: 19.7 % — AB (ref 0.0–13.0)
NEUT%: 40.4 % (ref 40.0–80.0)
NEUTROS ABS: 3.8 10*3/uL (ref 1.5–6.5)
PLATELETS: 169 10*3/uL (ref 145–400)
RBC: 4.92 10*6/uL (ref 4.20–5.70)
RDW: 14.9 % (ref 11.1–15.7)
WBC: 9.3 10*3/uL (ref 4.0–10.0)

## 2014-08-18 LAB — PROTIME-INR (CHCC SATELLITE)
INR: 3.2 (ref 2.0–3.5)
Protime: 38.4 s — ABNORMAL HIGH (ref 10.6–13.4)

## 2014-08-19 NOTE — Progress Notes (Signed)
Hematology and Oncology Follow Up Visit  Mark [name] [id number] December 02, 1945 69 y.o. 08/19/2014   Principle Diagnosis:  1. Refractory immune thrombocytopenia. 2. 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 3. Positive lupus anticoagulant.  Current Therapy:   1. Coumadin to maintain INR between     2. Nplate as indicated for platelet count less than 100,000. 3. Decadron - the patient to take at his discretion.     Interim History:  Mr.  [name] is back for followup. He is doing better. His back is not bothering him as much.   I did hear from his cardiologist yesterday. His cardiologist wants to put him on baby aspirin. I think this would be okay. Mark [name] has not had issues with thrombocytopenia for quite a while.  His been no issues with bleeding.  He still is doing some physical therapy to try to improve his left-sided weakness.  He is receiving in the mail a device that will help him platelet guitar again. He is very excited about this.  There's been no headache. He's had no visual symptoms.  He's had no problems with bowels or bladder.  Medications:  Current outpatient prescriptions:  .  acetaminophen (TYLENOL) 500 MG tablet, Take 1,000 mg by mouth every 8 (eight) hours as needed., Disp: , Rfl:  .  alendronate (FOSAMAX) 70 MG tablet, Take 70 mg by mouth every 7 (seven) days. Take with a full glass of water on an empty stomach. , Disp: , Rfl:  .  amantadine (SYMMETREL) 100 MG capsule, Take 100 mg by mouth daily., Disp: , Rfl:  .  amLODipine (NORVASC) 5 MG tablet, , Disp: , Rfl:  .  bethanechol (URECHOLINE) 25 MG tablet, , Disp: , Rfl:  .  cefUROXime (CEFTIN) 500 MG tablet, , Disp: , Rfl:  .  Cholecalciferol (VITAMIN D) 2000 UNITS tablet, Take 2,000 Units by mouth 2 (two) times daily. 4,000 units, Disp: , Rfl:  .  dexamethasone (DECADRON) 4 MG tablet, Take 10 mg by mouth. PRN ONLY  TAKES 40 MG DAILY FOR 4 DAYS THEN STOP, Disp: , Rfl:  .   diphenhydrAMINE (BENADRYL) 25 MG tablet, Take 25 mg by mouth every 6 (six) hours as needed (may take 1 to 2 tabs). , Disp: , Rfl:  .  famotidine (PEPCID) 40 MG tablet, Take 40 mg by mouth 2 (two) times daily as needed. , Disp: , Rfl:  .  fluticasone (FLONASE) 50 MCG/ACT nasal spray, , Disp: , Rfl:  .  levETIRAcetam (KEPPRA) 500 MG tablet, 500 mg. 3 tabs  in am and 3 tabs in pm, Disp: , Rfl:  .  Multiple Vitamins-Minerals (MULTIVITAMIN WITH MINERALS) tablet, Take 1 tablet by mouth daily. 100 mcg of vitamin K in this multi., Disp: , Rfl:  .  pyridOXINE (VITAMIN B-6) 100 MG tablet, Take 100 mg by mouth 2 (two) times daily., Disp: , Rfl:  .  rosuvastatin (CRESTOR) 20 MG tablet, Take 1 tablet (20 mg total) by mouth daily., Disp: 30 tablet, Rfl: 6 .  sertraline (ZOLOFT) 100 MG tablet, Take 100 mg by mouth daily. 1 tab in AM and 1/2 tab in PM=150 mg daily, Disp: , Rfl:  .  Tamsulosin HCl (FLOMAX) 0.4 MG CAPS, Take 0.8 mg by mouth daily. @ CAP = 0.8 MG, Disp: , Rfl:  .  traMADol (ULTRAM) 50 MG tablet, Take 1 tablet (50 mg total) by mouth every 6 (six) hours as needed., Disp: 60 tablet, Rfl: 2 .  warfarin (COUMADIN) 2  MG tablet, TAKE 4 MG (2 TABLETS) BY MOUTH ON THE FIRST DAY, 2 MG (1 TABLET) ON THE SECOND DAY, REPEAT THIS PATTERN UNTIL DOCTOR CHANGES, Disp: 90 tablet, Rfl: 0  Allergies: No Known Allergies  Past Medical History, Surgical history, Social history, and Family History were reviewed and updated.  Review of Systems:  as above   Physical Exam:  height is 5\' 9"  (1.753 m) and weight is 197 lb (89.359 kg). His oral temperature is 98.3 F (36.8 C). His blood pressure is 128/82 and his pulse is 66. His respiration is 18.    well-developed well-nourished white gentleman. Head and neck exam shows no ocular or oral reason. There is no adenopathy in the neck. Lungs are clear. Cardiac exam regular rate and rhythm with no murmurs, rubs or bruits. His abdomen is soft. There is no. fluid wave. There is no  probable liver or spleen tip. Extremities shows no clubbing cyanosis or edema. He has chronic weakness in his left side. Skin exam shows no rashes, ecchymosis or petechia. Neurological exam shows a weakness on his left-sided, more so in the left arm then left leg.  Lab Results  Component Value Date   WBC 9.3 08/18/2014   HGB 14.7 08/18/2014   HCT 43.4 08/18/2014   MCV 88 08/18/2014   PLT 169 08/18/2014     Chemistry      Component Value Date/Time   NA 138 12/30/2013 1449   NA 134 01/18/2008 0810   K 4.2 12/30/2013 1449   K 4.3 01/18/2008 0810   CL 106 12/30/2013 1449   CL 100 01/18/2008 0810   CO2 24 12/30/2013 1449   CO2 24 01/18/2008 0810   BUN 21 12/30/2013 1449   BUN 37* 01/18/2008 0810   CREATININE 1.03 12/30/2013 1449   CREATININE 1.5* 01/18/2008 0810      Component Value Date/Time   CALCIUM 9.1 12/30/2013 1449   CALCIUM 8.7 01/18/2008 0810   ALKPHOS 53 12/30/2013 1449   AST 38* 12/30/2013 1449   ALT 70* 12/30/2013 1449   BILITOT 0.6 12/30/2013 1449         Impression and Plan: Mark [name] is  69 year old gentleman with refractory immune thrombocytopenia. He also has a history of a thrombotic CVA. He's had residual left-sided weakness. This probably was back in October of 2012.    He is recovering slowly. He is continuing with to his therapy. He is trying his best. He is trying to play the guitar. 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 When his platelet count gets below 50,,000, he takes Decadron which always works  We will check his CBC and Coumadin now every 3 weeks. I think this is reasonable. He is okay with this. Hopefully, we can bring his appointments out further in the future.  I will plan to see him back in about 2 months or so.     Volanda Napoleon, MD 5/6/20167:08 AM

## 2014-09-01 ENCOUNTER — Other Ambulatory Visit: Payer: Medicare Other

## 2014-09-08 ENCOUNTER — Other Ambulatory Visit: Payer: Commercial Managed Care - HMO

## 2014-09-15 ENCOUNTER — Other Ambulatory Visit: Payer: Medicare Other

## 2014-09-16 ENCOUNTER — Other Ambulatory Visit (HOSPITAL_BASED_OUTPATIENT_CLINIC_OR_DEPARTMENT_OTHER): Payer: Commercial Managed Care - HMO

## 2014-09-16 DIAGNOSIS — Z8673 Personal history of transient ischemic attack (TIA), and cerebral infarction without residual deficits: Secondary | ICD-10-CM

## 2014-09-16 DIAGNOSIS — I63031 Cerebral infarction due to thrombosis of right carotid artery: Secondary | ICD-10-CM

## 2014-09-16 DIAGNOSIS — D693 Immune thrombocytopenic purpura: Secondary | ICD-10-CM | POA: Diagnosis not present

## 2014-09-16 LAB — CBC WITH DIFFERENTIAL (CANCER CENTER ONLY)
BASO#: 0.1 10*3/uL (ref 0.0–0.2)
BASO%: 0.5 % (ref 0.0–2.0)
EOS ABS: 0.7 10*3/uL — AB (ref 0.0–0.5)
EOS%: 7.2 % — ABNORMAL HIGH (ref 0.0–7.0)
HEMATOCRIT: 43.1 % (ref 38.7–49.9)
HEMOGLOBIN: 14.8 g/dL (ref 13.0–17.1)
LYMPH#: 3.1 10*3/uL (ref 0.9–3.3)
LYMPH%: 33.4 % (ref 14.0–48.0)
MCH: 30.2 pg (ref 28.0–33.4)
MCHC: 34.3 g/dL (ref 32.0–35.9)
MCV: 88 fL (ref 82–98)
MONO#: 1.4 10*3/uL — ABNORMAL HIGH (ref 0.1–0.9)
MONO%: 15.4 % — AB (ref 0.0–13.0)
NEUT%: 43.5 % (ref 40.0–80.0)
NEUTROS ABS: 4 10*3/uL (ref 1.5–6.5)
PLATELETS: 132 10*3/uL — AB (ref 145–400)
RBC: 4.9 10*6/uL (ref 4.20–5.70)
RDW: 15.2 % (ref 11.1–15.7)
WBC: 9.1 10*3/uL (ref 4.0–10.0)

## 2014-09-16 LAB — PROTIME-INR (CHCC SATELLITE)
INR: 3.7 — ABNORMAL HIGH (ref 2.0–3.5)
Protime: 44.4 Seconds — ABNORMAL HIGH (ref 10.6–13.4)

## 2014-09-29 ENCOUNTER — Other Ambulatory Visit: Payer: Medicare Other

## 2014-09-29 ENCOUNTER — Ambulatory Visit: Payer: Medicare Other | Admitting: Hematology & Oncology

## 2014-09-29 ENCOUNTER — Other Ambulatory Visit: Payer: Commercial Managed Care - HMO

## 2014-10-04 ENCOUNTER — Other Ambulatory Visit (HOSPITAL_BASED_OUTPATIENT_CLINIC_OR_DEPARTMENT_OTHER): Payer: Commercial Managed Care - HMO

## 2014-10-04 ENCOUNTER — Telehealth: Payer: Self-pay | Admitting: *Deleted

## 2014-10-04 ENCOUNTER — Encounter: Payer: Self-pay | Admitting: Family

## 2014-10-04 ENCOUNTER — Ambulatory Visit (HOSPITAL_BASED_OUTPATIENT_CLINIC_OR_DEPARTMENT_OTHER): Payer: Commercial Managed Care - HMO | Admitting: Family

## 2014-10-04 VITALS — BP 125/67 | HR 67 | Temp 98.4°F | Resp 16 | Ht 69.0 in | Wt 199.0 lb

## 2014-10-04 DIAGNOSIS — D693 Immune thrombocytopenic purpura: Secondary | ICD-10-CM

## 2014-10-04 DIAGNOSIS — I6529 Occlusion and stenosis of unspecified carotid artery: Secondary | ICD-10-CM | POA: Insufficient documentation

## 2014-10-04 DIAGNOSIS — I63031 Cerebral infarction due to thrombosis of right carotid artery: Secondary | ICD-10-CM

## 2014-10-04 DIAGNOSIS — Z8673 Personal history of transient ischemic attack (TIA), and cerebral infarction without residual deficits: Secondary | ICD-10-CM

## 2014-10-04 DIAGNOSIS — I82409 Acute embolism and thrombosis of unspecified deep veins of unspecified lower extremity: Secondary | ICD-10-CM | POA: Insufficient documentation

## 2014-10-04 DIAGNOSIS — B372 Candidiasis of skin and nail: Secondary | ICD-10-CM

## 2014-10-04 DIAGNOSIS — I639 Cerebral infarction, unspecified: Secondary | ICD-10-CM | POA: Insufficient documentation

## 2014-10-04 DIAGNOSIS — D6862 Lupus anticoagulant syndrome: Secondary | ICD-10-CM | POA: Insufficient documentation

## 2014-10-04 LAB — CBC WITH DIFFERENTIAL (CANCER CENTER ONLY)
BASO#: 0 10*3/uL (ref 0.0–0.2)
BASO%: 0 % (ref 0.0–2.0)
EOS ABS: 0 10*3/uL (ref 0.0–0.5)
EOS%: 0 % (ref 0.0–7.0)
HCT: 40.7 % (ref 38.7–49.9)
HGB: 14.1 g/dL (ref 13.0–17.1)
LYMPH#: 1.5 10*3/uL (ref 0.9–3.3)
LYMPH%: 5.6 % — ABNORMAL LOW (ref 14.0–48.0)
MCH: 30.5 pg (ref 28.0–33.4)
MCHC: 34.6 g/dL (ref 32.0–35.9)
MCV: 88 fL (ref 82–98)
MONO#: 2.1 10*3/uL — ABNORMAL HIGH (ref 0.1–0.9)
MONO%: 7.9 % (ref 0.0–13.0)
NEUT%: 86.5 % — AB (ref 40.0–80.0)
NEUTROS ABS: 23.4 10*3/uL — AB (ref 1.5–6.5)
Platelets: 148 10*3/uL (ref 145–400)
RBC: 4.62 10*6/uL (ref 4.20–5.70)
RDW: 15.9 % — AB (ref 11.1–15.7)
WBC: 27 10*3/uL — ABNORMAL HIGH (ref 4.0–10.0)

## 2014-10-04 LAB — PROTIME-INR (CHCC SATELLITE)
INR: 4.5 — AB (ref 2.0–3.5)
Protime: 54 Seconds — ABNORMAL HIGH (ref 10.6–13.4)

## 2014-10-04 LAB — TECHNOLOGIST REVIEW CHCC SATELLITE

## 2014-10-04 LAB — CHCC SATELLITE - SMEAR

## 2014-10-04 MED ORDER — NYSTATIN EX POWD: 1.0000 "application " | Freq: Two times a day (BID) | CUTANEOUS | Status: DC

## 2014-10-04 MED ORDER — DEXAMETHASONE 4 MG PO TABS: ORAL_TABLET | ORAL | Status: DC

## 2014-10-04 NOTE — Telephone Encounter (Signed)
Wife called concerned that patient has bruises to both his legs. She states that patient was seen last week by his PCP and that his platelet level was normal at that time. PCP thought rash may be an early cellulitis and placed patient on antibiotics. Legs are getting worse and are now painful. She would like patient to be seen. Spoke to Dr Marin Olp. We will bring patient in for labs and appointment with Laverna Peace NP.

## 2014-10-05 NOTE — Progress Notes (Signed)
Hematology and Oncology Follow Up Visit  Mark [name] [id number] 14-Apr-1946 69 y.o. 10/05/2014   Principle Diagnosis:  1. Refractory immune thrombocytopenia. 2. Positive lupus anticoagulant.  Current Therapy:   1. Coumadin to maintain INR between  2. Nplate as indicated for platelet count less than 100,000 3. Decadron - the patient to take at his discretion    Interim History:  Mark [name] is here today with his wife with c/o fatigue and bruising/petechiae on both lower legs. He started Decadron yesterday and will take 40 mg daily for 4 days. This appears to be improving since yesterday. He has had no episodes of bleeding or bruising. He has been taking Keflex for several days. This was given to him by his PCP for cellulitis. Today there is no warmth or edema at the site. His INR is elevated at 4.5. This is definitely a contributing factor to the petechiae.  He is having stiffness in his left side. He has residual left-sided weakness from a stroke.  He has had no fever, chills, n/v, dizziness, SOB, chest pain, palpitations, abdominal pain, constipation, diarrhea, blood in urine or stool.  He has some red, itchy areas under his arms. He feels that this is likely candidiasis. He was given a prescription for Lamisil and has not filled it. His wife states that he has a history of hepatitis so due to the adverse effects Lamisil can potentially cause to the liver he will not fill this and will try Nystatin powder for now instead.  He does have SOB with exertion and keeps a dry cough. These are not new issues. His lungs are clear on auscultation.  He is eating well and staying hydrated. His weight is stable.  He has had no swelling or tenderness in his extremities. No new aches or pains.  He is doing well with PT and has gotten a new guitar made for patients that have had strokes. He is very excited about this.   Medications:    Medication List       This list is accurate as of: 10/04/14  11:59 PM.  Always use your most recent med list.               alendronate 70 MG tablet  Commonly known as:  FOSAMAX  Take 70 mg by mouth every 7 (seven) days. Take with a full glass of water on an empty stomach.     amantadine 100 MG capsule  Commonly known as:  SYMMETREL  Take 100 mg by mouth daily.     amLODipine 5 MG tablet  Commonly known as:  NORVASC  Take 5 mg by mouth daily.     BENADRYL 25 MG tablet  Generic drug:  diphenhydrAMINE  Take 25 mg by mouth every 6 (six) hours as needed (may take 1 to 2 tabs).     bethanechol 25 MG tablet  Commonly known as:  URECHOLINE  Take 25 mg by mouth 2 (two) times daily.     cefUROXime 500 MG tablet  Commonly known as:  CEFTIN     dexamethasone 4 MG tablet  Commonly known as:  DECADRON  PRN ONLY  TAKES 40 MG DAILY FOR 4 DAYS THEN STOP     famotidine 40 MG tablet  Commonly known as:  PEPCID  Take 40 mg by mouth 2 (two) times daily as needed.     fluticasone 50 MCG/ACT nasal spray  Commonly known as:  FLONASE  Place 1 spray into both nostrils as  needed.     levETIRAcetam 500 MG tablet  Commonly known as:  KEPPRA  500 mg. 3 tabs  in am and 3 tabs in pm     multivitamin with minerals tablet  Take 1 tablet by mouth daily. 100 mcg of vitamin K in this multi.     NYSTATIN (TOPICAL) Powd  Apply 1 application topically 2 (two) times daily.     pyridOXINE 100 MG tablet  Commonly known as:  VITAMIN B-6  Take 100 mg by mouth 2 (two) times daily.     rosuvastatin 20 MG tablet  Commonly known as:  CRESTOR  Take 1 tablet (20 mg total) by mouth daily.     sertraline 100 MG tablet  Commonly known as:  ZOLOFT  Take 100 mg by mouth 2 (two) times daily. 1 tab in AM and 1/2 tab in PM=150 mg daily     tamsulosin 0.4 MG Caps capsule  Commonly known as:  FLOMAX  Take 0.8 mg by mouth daily. @ CAP = 0.8 MG     traMADol 50 MG tablet  Commonly known as:  ULTRAM  Take 1 tablet (50 mg total) by mouth every 6 (six) hours as needed.       TYLENOL 500 MG tablet  Generic drug:  acetaminophen  Take 1,000 mg by mouth every 8 (eight) hours as needed.     Vitamin D 2000 UNITS tablet  Take 2,000 Units by mouth 2 (two) times daily. 4,000 units     warfarin 2 MG tablet  Commonly known as:  COUMADIN  TAKE 4 MG (2 TABLETS) BY MOUTH ON THE FIRST DAY, 2 MG (1 TABLET) ON THE SECOND DAY, REPEAT THIS PATTERN UNTIL DOCTOR CHANGES        Allergies: No Known Allergies  Past Medical History, Surgical history, Social history, and Family History were reviewed and updated.  Review of Systems: All other 10 point review of systems is negative.   Physical Exam:  height is 5\' 9"  (1.753 m) and weight is 199 lb (90.266 kg). His oral temperature is 98.4 F (36.9 C). His blood pressure is 125/67 and his pulse is 67. His respiration is 16.   Wt Readings from Last 3 Encounters:  10/04/14 199 lb (90.266 kg)  08/18/14 197 lb (89.359 kg)  06/09/14 198 lb (89.812 kg)    Ocular: Sclerae unicteric, pupils equal, round and reactive to light Ear-nose-throat: Oropharynx clear, dentition fair Lymphatic: No cervical or supraclavicular adenopathy Lungs no rales or rhonchi, good excursion bilaterally Heart regular rate and rhythm, no murmur appreciated Abd soft, nontender, positive bowel sounds MSK no focal spinal tenderness, no joint edema Neuro: non-focal, well-oriented, appropriate affect Breasts: Deferred  Lab Results  Component Value Date   WBC 27.0* 10/04/2014   HGB 14.1 10/04/2014   HCT 40.7 10/04/2014   MCV 88 10/04/2014   PLT 148 10/04/2014   Lab Results  Component Value Date   FERRITIN 62 08/20/2010   Lab Results  Component Value Date   RETICCTPCT 1.5 08/20/2010   RBC 4.62 10/04/2014   RETICCTABS 75.3 08/20/2010   No results found for: KPAFRELGTCHN, LAMBDASER, KAPLAMBRATIO No results found for: IGGSERUM, IGA, IGMSERUM No results found for: Ronnald Ramp, A1GS, A2GS, Tillman Sers, SPEI    Chemistry      Component Value Date/Time   NA 138 12/30/2013 1449   NA 134 01/18/2008 0810   K 4.2 12/30/2013 1449   K 4.3 01/18/2008 0810   CL 106 12/30/2013 1449  CL 100 01/18/2008 0810   CO2 24 12/30/2013 1449   CO2 24 01/18/2008 0810   BUN 21 12/30/2013 1449   BUN 37* 01/18/2008 0810   CREATININE 1.03 12/30/2013 1449   CREATININE 1.5* 01/18/2008 0810      Component Value Date/Time   CALCIUM 9.1 12/30/2013 1449   CALCIUM 8.7 01/18/2008 0810   ALKPHOS 53 12/30/2013 1449   AST 38* 12/30/2013 1449   ALT 70* 12/30/2013 1449   BILITOT 0.6 12/30/2013 1449     Impression and Plan: Mr. Pile is 69 year old gentleman with refractory immune thrombocytopenia. 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 He is here today with bruising and petechiae in both lower legs. His INR is up at 4.5 today. His platelet count is 148 and he is not anemic at this time.  He started Decadron yesterday and will continue on for 3 more days. He can already teel some slight improvement.  We will have him hold his Coumadin today and tomorrow and resume on Thursday.  I sent a prescription for Nystatin powder for under his arms to the pharmacy.    He is doing well in PT and starting to play his new guitar.  We will continue checking his labs every 3 weeks.  We will plan to see him back in 6 weeks for follow-up and labs. His wife plans to call us on Friday to let us know how he is doing and if he continues to improve.  They know to call with any other questions or concerns. We can certainly see him sooner if need be.   Eliezer Bottom, NP 6/22/20168:43 AM

## 2014-10-06 ENCOUNTER — Other Ambulatory Visit: Payer: Medicare Other

## 2014-10-12 ENCOUNTER — Other Ambulatory Visit: Payer: Self-pay | Admitting: Hematology & Oncology

## 2014-10-20 ENCOUNTER — Other Ambulatory Visit (HOSPITAL_BASED_OUTPATIENT_CLINIC_OR_DEPARTMENT_OTHER): Payer: Commercial Managed Care - HMO

## 2014-10-20 ENCOUNTER — Ambulatory Visit (HOSPITAL_BASED_OUTPATIENT_CLINIC_OR_DEPARTMENT_OTHER): Payer: Commercial Managed Care - HMO | Admitting: Hematology & Oncology

## 2014-10-20 VITALS — BP 116/68 | HR 69 | Temp 98.2°F | Resp 18 | Wt 196.0 lb

## 2014-10-20 DIAGNOSIS — G8190 Hemiplegia, unspecified affecting unspecified side: Secondary | ICD-10-CM

## 2014-10-20 DIAGNOSIS — D6862 Lupus anticoagulant syndrome: Secondary | ICD-10-CM | POA: Diagnosis not present

## 2014-10-20 DIAGNOSIS — D693 Immune thrombocytopenic purpura: Secondary | ICD-10-CM

## 2014-10-20 DIAGNOSIS — G8194 Hemiplegia, unspecified affecting left nondominant side: Secondary | ICD-10-CM

## 2014-10-20 LAB — CBC WITH DIFFERENTIAL (CANCER CENTER ONLY)
BASO#: 0.1 10*3/uL (ref 0.0–0.2)
BASO%: 0.5 % (ref 0.0–2.0)
EOS%: 4.5 % (ref 0.0–7.0)
Eosinophils Absolute: 0.5 10*3/uL (ref 0.0–0.5)
HCT: 42 % (ref 38.7–49.9)
HGB: 14.3 g/dL (ref 13.0–17.1)
LYMPH#: 2.6 10*3/uL (ref 0.9–3.3)
LYMPH%: 24.6 % (ref 14.0–48.0)
MCH: 30.3 pg (ref 28.0–33.4)
MCHC: 34 g/dL (ref 32.0–35.9)
MCV: 89 fL (ref 82–98)
MONO#: 1.6 10*3/uL — ABNORMAL HIGH (ref 0.1–0.9)
MONO%: 15.5 % — ABNORMAL HIGH (ref 0.0–13.0)
NEUT#: 5.7 10*3/uL (ref 1.5–6.5)
NEUT%: 54.9 % (ref 40.0–80.0)
PLATELETS: 127 10*3/uL — AB (ref 145–400)
RBC: 4.72 10*6/uL (ref 4.20–5.70)
RDW: 15.6 % (ref 11.1–15.7)
WBC: 10.4 10*3/uL — AB (ref 4.0–10.0)

## 2014-10-20 LAB — COMPREHENSIVE METABOLIC PANEL
ALBUMIN: 4.2 g/dL (ref 3.5–5.2)
ALK PHOS: 62 U/L (ref 39–117)
ALT: 53 U/L (ref 0–53)
AST: 33 U/L (ref 0–37)
BILIRUBIN TOTAL: 0.6 mg/dL (ref 0.2–1.2)
BUN: 23 mg/dL (ref 6–23)
CO2: 23 mEq/L (ref 19–32)
CREATININE: 1.44 mg/dL — AB (ref 0.50–1.35)
Calcium: 9.1 mg/dL (ref 8.4–10.5)
Chloride: 105 mEq/L (ref 96–112)
Glucose, Bld: 112 mg/dL — ABNORMAL HIGH (ref 70–99)
Potassium: 4.3 mEq/L (ref 3.5–5.3)
Sodium: 140 mEq/L (ref 135–145)
Total Protein: 6.7 g/dL (ref 6.0–8.3)

## 2014-10-20 LAB — PROTIME-INR (CHCC SATELLITE)
INR: 2.7 (ref 2.0–3.5)
Protime: 32.4 Seconds — ABNORMAL HIGH (ref 10.6–13.4)

## 2014-10-20 NOTE — Progress Notes (Signed)
Hematology and Oncology Follow Up Visit  Mark [name] [id number] [birth year] 69 y.o. 10/20/2014   Principle Diagnosis:  1. Refractory immune thrombocytopenia. 2. 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 3. Positive lupus anticoagulant.  Current Therapy:   1. Coumadin to maintain INR between     2. Nplate as indicated for platelet count less than 100,000. 3. Decadron - the patient to take at his discretion.     Interim History:  Mr.  [name] is back for followup. He apparently was recently diagnosed with Lyme disease. He had a tick bite on the triceps area of his right arm. He had the target lesion for Lyme disease. He was placed on some antibiotic.  He is not sure which antibiotic it is. He says he will take it for a couple weeks.  Otherwise, he's been doing okay. He is had no bleeding. He has occasional bruises. His appetite is okay. He has had no nausea or vomiting. He has had no change in bowel or bladder habits. He has had no cough or shortness of breath.  There's been no change in the weakness over on his left side area and he is doing some there. To try to improve this.  He apparently will be going to a "stroke camp" that will help with his strength. He's been to one before.  His daughter, who is a Psychologist, clinical, was in town. She was to be in a big [employer] play but the play suddenly closed down.  He has had no back issues. He has had no leg swelling. He has had no visual changes.  Overall, his performance status is ECOG 2-3.  Medications:  Current outpatient prescriptions:  .  acetaminophen (TYLENOL) 500 MG tablet, Take 1,000 mg by mouth every 8 (eight) hours as needed., Disp: , Rfl:  .  alendronate (FOSAMAX) 70 MG tablet, Take 70 mg by mouth every 7 (seven) days. Take with a full glass of water on an empty stomach. , Disp: , Rfl:  .  amantadine (SYMMETREL) 100 MG capsule, Take 100 mg by mouth daily., Disp: , Rfl:  .  amLODipine (NORVASC) 5 MG  tablet, Take 5 mg by mouth daily. , Disp: , Rfl:  .  bethanechol (URECHOLINE) 25 MG tablet, Take 25 mg by mouth 2 (two) times daily. , Disp: , Rfl:  .  cefUROXime (CEFTIN) 500 MG tablet, , Disp: , Rfl:  .  Cholecalciferol (VITAMIN D) 2000 UNITS tablet, Take 2,000 Units by mouth 2 (two) times daily. 4,000 units, Disp: , Rfl:  .  dexamethasone (DECADRON) 4 MG tablet, PRN ONLY  TAKES 40 MG DAILY FOR 4 DAYS THEN STOP, Disp: 120 tablet, Rfl: 1 .  diphenhydrAMINE (BENADRYL) 25 MG tablet, Take 25 mg by mouth every 6 (six) hours as needed (may take 1 to 2 tabs). , Disp: , Rfl:  .  famotidine (PEPCID) 40 MG tablet, Take 40 mg by mouth 2 (two) times daily as needed. , Disp: , Rfl:  .  fluticasone (FLONASE) 50 MCG/ACT nasal spray, Place 1 spray into both nostrils as needed. , Disp: , Rfl:  .  levETIRAcetam (KEPPRA) 500 MG tablet, 500 mg. 3 tabs  in am and 3 tabs in pm, Disp: , Rfl:  .  Multiple Vitamins-Minerals (MULTIVITAMIN WITH MINERALS) tablet, Take 1 tablet by mouth daily. 100 mcg of vitamin K in this multi., Disp: , Rfl:  .  NYSTATIN, TOPICAL, POWD, Apply 1 application topically 2 (two) times daily., Disp: 1 Bottle, Rfl: 4 .  pyridOXINE (VITAMIN B-6) 100 MG tablet, Take 100 mg by mouth 2 (two) times daily., Disp: , Rfl:  .  rosuvastatin (CRESTOR) 20 MG tablet, Take 1 tablet (20 mg total) by mouth daily., Disp: 30 tablet, Rfl: 6 .  sertraline (ZOLOFT) 100 MG tablet, Take 100 mg by mouth 2 (two) times daily. 1 tab in AM and 1/2 tab in PM=150 mg daily, Disp: , Rfl:  .  Tamsulosin HCl (FLOMAX) 0.4 MG CAPS, Take 0.8 mg by mouth daily. @ CAP = 0.8 MG, Disp: , Rfl:  .  traMADol (ULTRAM) 50 MG tablet, Take 1 tablet (50 mg total) by mouth every 6 (six) hours as needed., Disp: 60 tablet, Rfl: 2 .  warfarin (COUMADIN) 2 MG tablet, TAKE 4 MG(2 TABLETS TOTAL)BY MOUTH ON THE FIRST DAY, THEN 2 MG(1 TABLET) ON THE SECOND DAY,REPEAT THIS PATTERN UNTIL DOCTOR CHANGES, Disp: 90 tablet, Rfl: 0  Allergies: No Known  Allergies  Past Medical History, Surgical history, Social history, and Family History were reviewed and updated.  Review of Systems:  as above   Physical Exam:  weight is 196 lb (88.905 kg). His oral temperature is 98.2 F (36.8 C). His blood pressure is 116/68 and his pulse is 69. His respiration is 18.    Well-developed well-nourished white gentleman. Head and neck exam shows no ocular or oral reason. There is no adenopathy in the neck. Lungs are clear. Cardiac exam regular rate and rhythm with no murmurs, rubs or bruits. His abdomen is soft. There is no. fluid wave. There is no probable liver or spleen tip. Extremities shows no clubbing cyanosis or edema. He has chronic weakness in his left side. Skin exam shows no rashes, ecchymosis or petechia. Neurological exam shows a weakness on his left-sided, more so in the left arm then left leg.  Lab Results  Component Value Date   WBC 10.4* 10/20/2014   HGB 14.3 10/20/2014   HCT 42.0 10/20/2014   MCV 89 10/20/2014   PLT 127* 10/20/2014     Chemistry      Component Value Date/Time   NA 138 12/30/2013 1449   NA 134 01/18/2008 0810   K 4.2 12/30/2013 1449   K 4.3 01/18/2008 0810   CL 106 12/30/2013 1449   CL 100 01/18/2008 0810   CO2 24 12/30/2013 1449   CO2 24 01/18/2008 0810   BUN 21 12/30/2013 1449   BUN 37* 01/18/2008 0810   CREATININE 1.03 12/30/2013 1449   CREATININE 1.5* 01/18/2008 0810      Component Value Date/Time   CALCIUM 9.1 12/30/2013 1449   CALCIUM 8.7 01/18/2008 0810   ALKPHOS 53 12/30/2013 1449   AST 38* 12/30/2013 1449   ALT 70* 12/30/2013 1449   BILITOT 0.6 12/30/2013 1449         Impression and Plan: Mark [name] is  69 year old gentleman with refractory immune thrombocytopenia. He also has a history of a thrombotic CVA. He's had residual left-sided weakness. This probably was back in October of 2012.    He is recovering slowly. He is continuing with to his therapy. He is trying his best. He is trying  to play the guitar. 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 When his platelet count gets below 50,,000, he takes Decadron which always works  We will check his CBC and Coumadin now every 3 weeks. I think this is reasonable. He  is okay with this. Hopefully, we can bring his appointments out further in the future.  I will plan to see him back in about 2 months or so.     Volanda Napoleon, MD 7/7/20165:14 PM

## 2014-10-23 ENCOUNTER — Other Ambulatory Visit: Payer: Self-pay | Admitting: Hematology & Oncology

## 2014-11-04 ENCOUNTER — Telehealth: Payer: Self-pay | Admitting: *Deleted

## 2014-11-04 NOTE — Telephone Encounter (Signed)
Patient is having issues with constipation. Wants to know if he can take Miralax for relief. Reviewed with Dr Marin Olp and this is okay. Patient is aware and will try miralax.

## 2014-11-10 ENCOUNTER — Other Ambulatory Visit: Payer: Commercial Managed Care - HMO

## 2014-11-17 ENCOUNTER — Other Ambulatory Visit (HOSPITAL_BASED_OUTPATIENT_CLINIC_OR_DEPARTMENT_OTHER): Payer: Commercial Managed Care - HMO

## 2014-11-17 ENCOUNTER — Other Ambulatory Visit: Payer: Medicare Other

## 2014-11-17 ENCOUNTER — Ambulatory Visit: Payer: Medicare Other | Admitting: Hematology & Oncology

## 2014-11-17 DIAGNOSIS — D6862 Lupus anticoagulant syndrome: Secondary | ICD-10-CM | POA: Diagnosis not present

## 2014-11-17 DIAGNOSIS — Z8673 Personal history of transient ischemic attack (TIA), and cerebral infarction without residual deficits: Secondary | ICD-10-CM | POA: Diagnosis not present

## 2014-11-17 DIAGNOSIS — I63031 Cerebral infarction due to thrombosis of right carotid artery: Secondary | ICD-10-CM

## 2014-11-17 LAB — CBC WITH DIFFERENTIAL (CANCER CENTER ONLY)
BASO#: 0 10*3/uL (ref 0.0–0.2)
BASO%: 0.4 % (ref 0.0–2.0)
EOS ABS: 0.4 10*3/uL (ref 0.0–0.5)
EOS%: 3.9 % (ref 0.0–7.0)
HCT: 41.6 % (ref 38.7–49.9)
HEMOGLOBIN: 14.2 g/dL (ref 13.0–17.1)
LYMPH#: 2.6 10*3/uL (ref 0.9–3.3)
LYMPH%: 23.9 % (ref 14.0–48.0)
MCH: 30.7 pg (ref 28.0–33.4)
MCHC: 34.1 g/dL (ref 32.0–35.9)
MCV: 90 fL (ref 82–98)
MONO#: 2.1 10*3/uL — AB (ref 0.1–0.9)
MONO%: 18.9 % — ABNORMAL HIGH (ref 0.0–13.0)
NEUT#: 5.8 10*3/uL (ref 1.5–6.5)
NEUT%: 52.9 % (ref 40.0–80.0)
Platelets: 122 10*3/uL — ABNORMAL LOW (ref 145–400)
RBC: 4.62 10*6/uL (ref 4.20–5.70)
RDW: 15.1 % (ref 11.1–15.7)
WBC: 10.9 10*3/uL — AB (ref 4.0–10.0)

## 2014-11-17 LAB — PROTIME-INR (CHCC SATELLITE)
INR: 3.5 (ref 2.0–3.5)
PROTIME: 42 s — AB (ref 10.6–13.4)

## 2014-11-30 ENCOUNTER — Other Ambulatory Visit: Payer: Self-pay | Admitting: Nurse Practitioner

## 2014-11-30 DIAGNOSIS — D693 Immune thrombocytopenic purpura: Secondary | ICD-10-CM

## 2014-12-01 ENCOUNTER — Other Ambulatory Visit: Payer: Commercial Managed Care - HMO

## 2014-12-02 ENCOUNTER — Other Ambulatory Visit (HOSPITAL_BASED_OUTPATIENT_CLINIC_OR_DEPARTMENT_OTHER): Payer: Commercial Managed Care - HMO

## 2014-12-02 ENCOUNTER — Other Ambulatory Visit: Payer: Self-pay | Admitting: Family

## 2014-12-02 DIAGNOSIS — D693 Immune thrombocytopenic purpura: Secondary | ICD-10-CM

## 2014-12-02 DIAGNOSIS — D6862 Lupus anticoagulant syndrome: Secondary | ICD-10-CM | POA: Diagnosis not present

## 2014-12-02 LAB — CBC WITH DIFFERENTIAL (CANCER CENTER ONLY)
BASO#: 0 10*3/uL (ref 0.0–0.2)
BASO%: 0.3 % (ref 0.0–2.0)
EOS%: 6.2 % (ref 0.0–7.0)
Eosinophils Absolute: 0.7 10*3/uL — ABNORMAL HIGH (ref 0.0–0.5)
HCT: 44 % (ref 38.7–49.9)
HGB: 15.2 g/dL (ref 13.0–17.1)
LYMPH#: 2.5 10*3/uL (ref 0.9–3.3)
LYMPH%: 21.5 % (ref 14.0–48.0)
MCH: 30.9 pg (ref 28.0–33.4)
MCHC: 34.5 g/dL (ref 32.0–35.9)
MCV: 89 fL (ref 82–98)
MONO#: 1.8 10*3/uL — ABNORMAL HIGH (ref 0.1–0.9)
MONO%: 14.9 % — AB (ref 0.0–13.0)
NEUT#: 6.7 10*3/uL — ABNORMAL HIGH (ref 1.5–6.5)
NEUT%: 57.1 % (ref 40.0–80.0)
PLATELETS: 211 10*3/uL (ref 145–400)
RBC: 4.92 10*6/uL (ref 4.20–5.70)
RDW: 15.1 % (ref 11.1–15.7)
WBC: 11.8 10*3/uL — AB (ref 4.0–10.0)

## 2014-12-02 LAB — PROTIME-INR (CHCC SATELLITE)
INR: 4.4 — AB (ref 2.0–3.5)
PROTIME: 52.8 s — AB (ref 10.6–13.4)

## 2014-12-02 MED ORDER — DOXYCYCLINE HYCLATE 100 MG PO TABS: 100.0000 mg | ORAL_TABLET | Freq: Two times a day (BID) | ORAL | Status: DC

## 2014-12-02 NOTE — Progress Notes (Signed)
[name] is here today with a skin tear on his right shin that is weeping. There is no odor. The tissue around the wound is pink and slightly warm to the tough. I did clean and bandage the wound. We will have him take Doxycycline BID for 10 days. He has not had any episodes of bleeding. He is also on coumadin and is aware of what side effects to watch for with this. All questions were answered and they are in agreement with the plan. He has no other complaints. His assessment was negative.

## 2014-12-15 ENCOUNTER — Ambulatory Visit: Payer: Self-pay | Admitting: Hematology & Oncology

## 2014-12-15 ENCOUNTER — Other Ambulatory Visit: Payer: Commercial Managed Care - HMO

## 2014-12-22 ENCOUNTER — Other Ambulatory Visit: Payer: Medicare Other

## 2014-12-22 ENCOUNTER — Ambulatory Visit: Payer: Medicare Other | Admitting: Hematology & Oncology

## 2014-12-23 ENCOUNTER — Telehealth: Payer: Self-pay | Admitting: Hematology & Oncology

## 2014-12-23 ENCOUNTER — Other Ambulatory Visit (HOSPITAL_BASED_OUTPATIENT_CLINIC_OR_DEPARTMENT_OTHER): Payer: Commercial Managed Care - HMO

## 2014-12-23 ENCOUNTER — Encounter: Payer: Self-pay | Admitting: Family

## 2014-12-23 ENCOUNTER — Ambulatory Visit (HOSPITAL_BASED_OUTPATIENT_CLINIC_OR_DEPARTMENT_OTHER): Payer: Commercial Managed Care - HMO | Admitting: Family

## 2014-12-23 VITALS — BP 113/68 | HR 74 | Temp 98.3°F | Resp 18 | Ht 69.0 in | Wt 195.0 lb

## 2014-12-23 DIAGNOSIS — D693 Immune thrombocytopenic purpura: Secondary | ICD-10-CM

## 2014-12-23 DIAGNOSIS — D6862 Lupus anticoagulant syndrome: Secondary | ICD-10-CM

## 2014-12-23 DIAGNOSIS — G8194 Hemiplegia, unspecified affecting left nondominant side: Secondary | ICD-10-CM

## 2014-12-23 LAB — CMP (CANCER CENTER ONLY)
ALK PHOS: 64 U/L (ref 26–84)
ALT: 51 U/L — AB (ref 10–47)
AST: 46 U/L — ABNORMAL HIGH (ref 11–38)
Albumin: 4.1 g/dL (ref 3.3–5.5)
BILIRUBIN TOTAL: 0.7 mg/dL (ref 0.20–1.60)
BUN: 22 mg/dL (ref 7–22)
CALCIUM: 9.5 mg/dL (ref 8.0–10.3)
CO2: 26 mEq/L (ref 18–33)
Chloride: 106 mEq/L (ref 98–108)
Creat: 1 mg/dl (ref 0.6–1.2)
Glucose, Bld: 111 mg/dL (ref 73–118)
POTASSIUM: 4.3 meq/L (ref 3.3–4.7)
Sodium: 142 mEq/L (ref 128–145)
TOTAL PROTEIN: 7.6 g/dL (ref 6.4–8.1)

## 2014-12-23 LAB — CBC WITH DIFFERENTIAL (CANCER CENTER ONLY)
BASO#: 0 10*3/uL (ref 0.0–0.2)
BASO%: 0.5 % (ref 0.0–2.0)
EOS ABS: 0.5 10*3/uL (ref 0.0–0.5)
EOS%: 6.5 % (ref 0.0–7.0)
HEMATOCRIT: 45.6 % (ref 38.7–49.9)
HGB: 15.2 g/dL (ref 13.0–17.1)
LYMPH#: 2.2 10*3/uL (ref 0.9–3.3)
LYMPH%: 26.4 % (ref 14.0–48.0)
MCH: 29.8 pg (ref 28.0–33.4)
MCHC: 33.3 g/dL (ref 32.0–35.9)
MCV: 89 fL (ref 82–98)
MONO#: 1.3 10*3/uL — AB (ref 0.1–0.9)
MONO%: 16 % — ABNORMAL HIGH (ref 0.0–13.0)
NEUT#: 4.1 10*3/uL (ref 1.5–6.5)
NEUT%: 50.6 % (ref 40.0–80.0)
Platelets: 135 10*3/uL — ABNORMAL LOW (ref 145–400)
RBC: 5.1 10*6/uL (ref 4.20–5.70)
RDW: 15 % (ref 11.1–15.7)
WBC: 8.2 10*3/uL (ref 4.0–10.0)

## 2014-12-23 LAB — PROTIME-INR (CHCC SATELLITE)

## 2014-12-23 LAB — PROTHROMBIN TIME
INR: 3.58 — ABNORMAL HIGH (ref <1.50)
PROTHROMBIN TIME: 36.3 s — AB (ref 11.6–15.2)

## 2014-12-23 NOTE — Telephone Encounter (Signed)
Mark [name]: [id number] Requesting Provider: Dr. Harlan Stains Treating Provider: Dr. Burney Gauze Visits: 6 Status: Approved Dates: 12/23/2014 - 06/21/2015        COPY SCANNED

## 2014-12-23 NOTE — Progress Notes (Signed)
Hematology and Oncology Follow Up Visit  NIL XIONG [id number] 1945-05-08 69 y.o. 12/23/2014   Principle Diagnosis:  1. Refractory immune thrombocytopenia. 2. Positive lupus anticoagulant.  Current Therapy:   1. Coumadin to maintain INR between  2. Nplate as indicated for platelet count less than 100,000 3. Decadron - the patient to take at his discretion    Interim History:  Mr. Mayhall is here today with his wife for a follow-up. He is doing fairly well. He fell while walking with a friend recently and skinned up his knees. Thankfully he was not badly injured.  He is still going to occupational therapy at [facility] and working on improving the function on his left side. He is having issues with "dragging" the left leg and losing some of the strength he had built up. He has finally gotten his guitar slider hooked up. 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 He will be going to "stroke camp" at the end of the month and plans to [place] during one of the presentations. He really wants to be an encouragement to his fellow stroke victims.   He is having some mild SOB at times when walking. This does resolve if he takes a break.  He has had no fever, chills, n/v, dizziness, chest pain, palpitations, abdominal pain, changes in bowel or bladder habits. He has not noticed any blood in his urine or stool.  He has headaches periodically that resolve with tylenol and a wet washcloth on his forehead.  He is eating well and staying hydrated. His weight is stable.  He has had no swelling in his extremities. He does have some petechiae on his legs.   Medications:    Medication List       This list is accurate as of: 12/23/14 12:18 PM.  Always use your most recent med list.               alendronate 70 MG tablet  Commonly known as:  FOSAMAX  Take 70 mg by mouth every 7 (seven) days. Take with a full glass of water on an empty stomach.       amantadine 100 MG capsule  Commonly known as:  SYMMETREL  Take 100 mg by mouth daily.     amLODipine 5 MG tablet  Commonly known as:  NORVASC  Take 5 mg by mouth daily.     BENADRYL 25 MG tablet  Generic drug:  diphenhydrAMINE  Take 25 mg by mouth every 6 (six) hours as needed (may take 1 to 2 tabs).     bethanechol 25 MG tablet  Commonly known as:  URECHOLINE  Take 25 mg by mouth 2 (two) times daily.     buPROPion 150 MG 24 hr tablet  Commonly known as:  WELLBUTRIN XL     cefUROXime 500 MG tablet  Commonly known as:  CEFTIN     dexamethasone 4 MG tablet  Commonly known as:  DECADRON  PRN ONLY  TAKES 40 MG DAILY FOR 4 DAYS THEN STOP     doxycycline 100 MG tablet  Commonly known as:  VIBRA-TABS  Take 1 tablet (100 mg total) by mouth 2 (two) times daily.     famotidine 40 MG tablet  Commonly known as:  PEPCID  Take 40 mg by mouth 2 (two) times daily as needed.     fluticasone 50 MCG/ACT nasal spray  Commonly known as:  FLONASE  Place 1 spray into both nostrils as needed.     levETIRAcetam 500 MG tablet  Commonly known as:  KEPPRA  500 mg. 3 tabs  in am and 3 tabs in pm     multivitamin with minerals tablet  Take 1 tablet by mouth daily. 100 mcg of vitamin K in this multi.     NYSTATIN (TOPICAL) Powd  Apply 1 application topically 2 (two) times daily.     pyridOXINE 100 MG tablet  Commonly known as:  VITAMIN B-6  Take 100 mg by mouth 2 (two) times daily.     rosuvastatin 20 MG tablet  Commonly known as:  CRESTOR  Take 1 tablet (20 mg total) by mouth daily.     CRESTOR 20 MG tablet  Generic drug:  rosuvastatin     sertraline 100 MG tablet  Commonly known as:  ZOLOFT  Take 100 mg by mouth 2 (two) times daily. 1 tab in AM and 1/2 tab in PM=150 mg daily     tamsulosin 0.4 MG Caps capsule  Commonly known as:  FLOMAX  Take 0.8 mg by mouth daily. @ CAP = 0.8 MG     traMADol 50 MG tablet  Commonly known as:  ULTRAM  Take 1 tablet (50 mg total) by mouth  every 6 (six) hours as needed.     TYLENOL 500 MG tablet  Generic drug:  acetaminophen  Take 1,000 mg by mouth every 8 (eight) hours as needed.     Vitamin D 2000 UNITS tablet  Take 2,000 Units by mouth 2 (two) times daily. 4,000 units     warfarin 2 MG tablet  Commonly known as:  COUMADIN  TAKE 4 MG(2 TABLETS TOTAL)BY MOUTH ON THE FIRST DAY, THEN 2 MG(1 TABLET) ON THE SECOND DAY,REPEAT THIS PATTERN UNTIL DOCTOR CHANGES     warfarin 2 MG tablet  Commonly known as:  COUMADIN  TAKE 4 MG(2 TABLETS TOTAL)BY MOUTH ON THE FIRST DAY, THEN 2 MG(1 TABLET) ON THE SECOND DAY,REPEAT THIS PATTERN UNTIL DOCTOR CHANGES        Allergies: No Known Allergies  Past Medical History, Surgical history, Social history, and Family History were reviewed and updated.  Review of Systems: All other 10 point review of systems is negative.   Physical Exam:  height is 5\' 9"  (1.753 m) and weight is 195 lb (88.451 kg). His oral temperature is 98.3 F (36.8 C). His blood pressure is 113/68 and his pulse is 74. His respiration is 18.   Wt Readings from Last 3 Encounters:  12/23/14 195 lb (88.451 kg)  10/20/14 196 lb (88.905 kg)  10/04/14 199 lb (90.266 kg)    Ocular: Sclerae unicteric, pupils equal, round and reactive to light Ear-nose-throat: Oropharynx clear, dentition fair Lymphatic: No cervical or supraclavicular adenopathy Lungs no rales or rhonchi, good excursion bilaterally Heart regular rate and rhythm, no murmur appreciated Abd soft, nontender, positive bowel sounds MSK no focal spinal tenderness, no joint edema Neuro: non-focal, well-oriented, appropriate affect Breasts: Deferred  Lab Results  Component Value Date   WBC 8.2 12/23/2014   HGB 15.2 12/23/2014   HCT 45.6 12/23/2014   MCV 89 12/23/2014   PLT 135* 12/23/2014   Lab Results  Component Value Date   FERRITIN 62 08/20/2010   Lab Results  Component Value Date   RETICCTPCT 1.5 08/20/2010   RBC 5.10 12/23/2014   RETICCTABS  75.3 08/20/2010   No results found for: KPAFRELGTCHN, LAMBDASER, KAPLAMBRATIO No results found for: IGGSERUM, IGA,  IGMSERUM No results found for: Odetta Pink, SPEI   Chemistry      Component Value Date/Time   NA 142 12/23/2014 1125   NA 140 10/20/2014 1505   K 4.3 12/23/2014 1125   K 4.3 10/20/2014 1505   CL 106 12/23/2014 1125   CL 105 10/20/2014 1505   CO2 26 12/23/2014 1125   CO2 23 10/20/2014 1505   BUN 22 12/23/2014 1125   BUN 23 10/20/2014 1505   CREATININE 1.0 12/23/2014 1125   CREATININE 1.44* 10/20/2014 1505      Component Value Date/Time   CALCIUM 9.5 12/23/2014 1125   CALCIUM 9.1 10/20/2014 1505   ALKPHOS 64 12/23/2014 1125   ALKPHOS 62 10/20/2014 1505   AST 46* 12/23/2014 1125   AST 33 10/20/2014 1505   ALT 51* 12/23/2014 1125   ALT 53 10/20/2014 1505   BILITOT 0.70 12/23/2014 1125   BILITOT 0.6 10/20/2014 1505     Impression and Plan: Mr. Mapel is 69 year old gentleman with refractory immune thrombocytopenia. 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 He has some petechiae on his legs today. His INR is 4.4 with PT of 52.8. No anemia at this time.  We will have him take 2 mg of Coumadin daily for 4 days and then go back alternating.  We will continue checking his labs every 3 weeks.  We will plan to see him back in 2 months for follow-up and labs. Both he and his wife know to contact us with any other questions or concerns. We can certainly see him sooner if need be.   Eliezer Bottom, NP 9/9/201612:18 PM

## 2015-01-11 ENCOUNTER — Other Ambulatory Visit: Payer: Medicare Other

## 2015-01-19 ENCOUNTER — Other Ambulatory Visit (HOSPITAL_COMMUNITY)
Admission: RE | Admit: 2015-01-19 | Discharge: 2015-01-19 | Disposition: A | Discharge status: Home / Self Care | Payer: Commercial Managed Care - HMO | Source: Ambulatory Visit | Attending: Hematology & Oncology | Admitting: Hematology & Oncology

## 2015-01-19 ENCOUNTER — Telehealth: Payer: Self-pay | Admitting: *Deleted

## 2015-01-19 ENCOUNTER — Other Ambulatory Visit (HOSPITAL_BASED_OUTPATIENT_CLINIC_OR_DEPARTMENT_OTHER): Payer: Commercial Managed Care - HMO

## 2015-01-19 ENCOUNTER — Other Ambulatory Visit: Payer: Self-pay | Admitting: *Deleted

## 2015-01-19 DIAGNOSIS — D693 Immune thrombocytopenic purpura: Secondary | ICD-10-CM

## 2015-01-19 DIAGNOSIS — I82409 Acute embolism and thrombosis of unspecified deep veins of unspecified lower extremity: Secondary | ICD-10-CM | POA: Diagnosis present

## 2015-01-19 LAB — CBC WITH DIFFERENTIAL (CANCER CENTER ONLY)
BASO#: 0 10*3/uL (ref 0.0–0.2)
BASO%: 0.1 % (ref 0.0–2.0)
EOS%: 2.2 % (ref 0.0–7.0)
Eosinophils Absolute: 0.3 10*3/uL (ref 0.0–0.5)
HEMATOCRIT: 47.6 % (ref 38.7–49.9)
HEMOGLOBIN: 16.3 g/dL (ref 13.0–17.1)
LYMPH#: 3.7 10*3/uL — AB (ref 0.9–3.3)
LYMPH%: 24.8 % (ref 14.0–48.0)
MCH: 30.2 pg (ref 28.0–33.4)
MCHC: 34.2 g/dL (ref 32.0–35.9)
MCV: 88 fL (ref 82–98)
MONO#: 2.6 10*3/uL — ABNORMAL HIGH (ref 0.1–0.9)
MONO%: 17.1 % — AB (ref 0.0–13.0)
NEUT%: 55.8 % (ref 40.0–80.0)
NEUTROS ABS: 8.4 10*3/uL — AB (ref 1.5–6.5)
Platelets: 235 10*3/uL (ref 145–400)
RBC: 5.4 10*6/uL (ref 4.20–5.70)
RDW: 15.3 % (ref 11.1–15.7)
WBC: 15.1 10*3/uL — AB (ref 4.0–10.0)

## 2015-01-19 LAB — PROTIME-INR (CHCC SATELLITE)

## 2015-01-19 LAB — PROTIME-INR
INR: 4.74 — AB (ref 0.00–1.49)
PROTHROMBIN TIME: 43.2 s — AB (ref 11.6–15.2)

## 2015-01-19 LAB — TECHNOLOGIST REVIEW CHCC SATELLITE

## 2015-01-19 NOTE — Telephone Encounter (Signed)
Patient had increased bruising last week. He wants Dr Marin Olp to be aware that he took his "emergency decadron" 40mg  daily this past weekend. September 30th through October 3rd.   Dr Marin Olp made aware.

## 2015-01-20 ENCOUNTER — Other Ambulatory Visit: Payer: Commercial Managed Care - HMO

## 2015-02-07 ENCOUNTER — Encounter: Payer: Self-pay | Admitting: Physical Therapy

## 2015-02-07 ENCOUNTER — Ambulatory Visit: Payer: Commercial Managed Care - HMO | Attending: Neurology | Admitting: Physical Therapy

## 2015-02-07 DIAGNOSIS — R2681 Unsteadiness on feet: Secondary | ICD-10-CM | POA: Diagnosis present

## 2015-02-07 DIAGNOSIS — Z5189 Encounter for other specified aftercare: Secondary | ICD-10-CM | POA: Insufficient documentation

## 2015-02-07 DIAGNOSIS — I69898 Other sequelae of other cerebrovascular disease: Secondary | ICD-10-CM | POA: Diagnosis present

## 2015-02-07 DIAGNOSIS — R2689 Other abnormalities of gait and mobility: Secondary | ICD-10-CM

## 2015-02-07 DIAGNOSIS — R531 Weakness: Secondary | ICD-10-CM | POA: Diagnosis present

## 2015-02-07 DIAGNOSIS — IMO0002 Reserved for concepts with insufficient information to code with codable children: Secondary | ICD-10-CM

## 2015-02-07 DIAGNOSIS — R29818 Other symptoms and signs involving the nervous system: Secondary | ICD-10-CM | POA: Diagnosis present

## 2015-02-07 DIAGNOSIS — R269 Unspecified abnormalities of gait and mobility: Secondary | ICD-10-CM | POA: Diagnosis not present

## 2015-02-07 DIAGNOSIS — Z4789 Encounter for other orthopedic aftercare: Secondary | ICD-10-CM

## 2015-02-08 ENCOUNTER — Other Ambulatory Visit: Payer: Self-pay | Admitting: *Deleted

## 2015-02-08 DIAGNOSIS — D693 Immune thrombocytopenic purpura: Secondary | ICD-10-CM

## 2015-02-08 NOTE — Therapy (Signed)
[facility] [address] [place], [place], [address] Phone: [phone]   Fax:  [phone]  Physical Therapy Evaluation  Patient Details  Name: Mark [name] MRN: [id number] Date of Birth: Oct 01, 1945 Referring Provider: Theone Murdoch, MD  Encounter Date: 02/07/2015      PT End of Session - 02/07/15 1445    Visit Number 1   Number of Visits 17   Date for PT Re-Evaluation 04/08/15   Authorization Type Medicare G-Code & progress report every 10 visits   PT Start Time 1402   PT Stop Time 1445   PT Time Calculation (min) 43 min   Equipment Utilized During Treatment Gait belt   Activity Tolerance Patient tolerated treatment well   Behavior During Therapy [facility] for tasks assessed/performed      History reviewed. No pertinent past medical history.  History reviewed. No pertinent past surgical history.  There were no vitals filed for this visit.  Visit Diagnosis:  Abnormality of gait  Unsteadiness  Balance problems  Encounter for training in use of orthotic device  Weakness due to cerebrovascular accident      Subjective Assessment - 02/07/15 1413    Subjective This 69yo had CVA Nov 2012. He reports increased issues with left LE coordination and sterogonosis with greater issues with balance and gait. He presents for PT evaluation. He reports one recent fall. He also reports MD recommended AFO and he is concerned it will lead to atrophy of his calf muscle.    Patient is accompained by: Family member   Patient Stated Goals He wants to avoid use of AFO & increase coordination of left lower extremity.    Currently in Pain? Yes   Pain Score 0-No pain  in last week, worst 8-9/10, best 0/10   Pain Location Back   Pain Orientation Lower;Right   Pain Descriptors / Indicators Aching;Stabbing   Pain Type Chronic pain   Pain Onset More than a month ago   Pain Frequency Intermittent   Aggravating Factors  moving  certain ways,    Pain Relieving Factors laying down, meds   Effect of Pain on Daily Activities reduces activities.   Multiple Pain Sites No            [facility] PT Assessment - 02/07/15 1400    Assessment   Medical Diagnosis spastic hemiplegia   Referring Provider Theone Murdoch, MD   Precautions   Precautions Fall   Restrictions   Weight Bearing Restrictions No   Balance Screen   Has the patient fallen in the past 6 months Yes   How many times? 3  minor scrapes   Has the patient had a decrease in activity level because of a fear of falling?  No   Is the patient reluctant to leave their home because of a fear of falling?  No   Home Environment   Living Environment Private residence   Living Arrangements Spouse/significant other   Type of [place] to enter   Entrance Stairs-Number of Steps 4   Entrance Stairs-Rails Right;Left;Can reach both   [place] One level  one step to [place] bars - tub/shower   Prior Function   Level of Independence Independent;Independent with gait   Observation/Other Assessments   Focus on Therapeutic Outcomes (FOTO)  53.2 Functional Status    Stroke Impact Scale  69.4   Fear Avoidance Belief Questionnaire (FABQ)  19   Sensation  Stereognosis Impaired by gross assessment   Proprioception Impaired by gross assessment   Posture/Postural Control   Posture/Postural Control Postural limitations   Postural Limitations Rounded Shoulders;Forward head;Weight shift right   Tone   Assessment Location Left Lower Extremity   ROM / Strength   AROM / PROM / Strength Strength;PROM   PROM   Overall PROM  Deficits   Overall PROM Comments Tightness in hip flexors, hamstrings and gastroc   Strength   Overall Strength Deficits   Strength Assessment Site Hip;Knee;Ankle   Right/Left Hip Left   Left Hip Flexion 4-/5   Left Hip Extension 3-/5   Left Hip ABduction 3/5   Right/Left Knee Left   Left Knee Flexion 3+/5    Left Knee Extension 4/5   Right/Left Ankle Left   Left Ankle Dorsiflexion 3-/5   Ambulation/Gait   Ambulation/Gait Yes   Ambulation/Gait Assistance 5: Supervision   Ambulation Distance (Feet) 115 Feet   Assistive device None   Gait Pattern Decreased arm swing - left;Left genu recurvatum;Right genu recurvatum;Poor foot clearance - left;Lateral trunk lean to left;Decreased dorsiflexion - left;Step-through pattern;Decreased stance time - left;Decreased step length - right;Decreased hip/knee flexion - left;Left foot flat;Trunk flexed  L toe in    Ambulation Surface Level;Indoor   Gait velocity 3.16 ft/sec   Standardized Balance Assessment   Standardized Balance Assessment Berg Balance Test;Timed Up and Go Test   Berg Balance Test   Sit to Stand Able to stand  independently using hands   Standing Unsupported Able to stand safely 2 minutes   Sitting with Back Unsupported but Feet Supported on Floor or Stool Able to sit safely and securely 2 minutes   Stand to Sit Sits safely with minimal use of hands   Transfers Able to transfer safely, minor use of hands   Standing Unsupported with Eyes Closed Able to stand 10 seconds safely   Standing Ubsupported with Feet Together Able to place feet together independently and stand 1 minute safely   From Standing, Reach Forward with Outstretched Arm Can reach forward >12 cm safely (5")   From Standing Position, Pick up Object from Floor Able to pick up shoe safely and easily   From Standing Position, Turn to Look Behind Over each Shoulder Looks behind one side only/other side shows less weight shift   Turn 360 Degrees Able to turn 360 degrees safely but slowly   Standing Unsupported, Alternately Place Feet on Step/Stool Able to stand independently and safely and complete 8 steps in 20 seconds   Standing Unsupported, One Foot in Front Able to plae foot ahead of the other independently and hold 30 seconds   Standing on One Leg Able to lift leg independently  and hold > 10 seconds   Total Score 50   Timed Up and Go Test   Normal TUG (seconds) 10.43  no device   Manual TUG (seconds) 13.13  no device   Cognitive TUG (seconds) 20.09  no device, increase 92.6% from std TUG, 1 error   TUG Comments Cognitive TUG indicates fall risk with distraction   LLE Tone   LLE Tone Brunnstrom Scale                             PT Short Term Goals - 02/07/15 1600    PT SHORT TERM GOAL #1   Title Pt will decrease TUG-COG score from 20.09 to <17.5 seconds to indicate a decreased fall risk as  a community dwelling older adult. (Target Date: 03/09/2015)   Time 1   Period Months   Status New   PT SHORT TERM GOAL #2   Title Patient and wife verbalize understanding of AFO recommendation and rationale.  (Target Date: 03/09/2015)   Time 1   Period Months   Status New   PT SHORT TERM GOAL #3   Title Pt will demonstrate understanding of initial HEP.    Time 1   Period Months   Status New           PT Long Term Goals - 2015/02/19 1525    PT LONG TERM GOAL #1   Title Patient and wife demonstrate and verbalize understanding of ongoing fitness plan.  (Target Date: 04/08/2015)   Time 2   Period Months   Status New   PT LONG TERM GOAL #2   Title Patient ambulates 500' on outside surfaces including grass modified independent with AFO only.  (Target Date: 04/08/2015)   Time 2   Period Months   Status New   PT LONG TERM GOAL #3   Title Patient negotiates ramps, curbs and stairs (1 rail) with AFO only modified independent.  (Target Date: 04/08/2015)   Time 2   Period Months   Status New   PT LONG TERM GOAL #4   Title Berg Balance >/= 53/56 to decrease fall risk  (Target Date: 04/08/2015)   Time 2   Period Months   Status New   PT LONG TERM GOAL #5   Title Cognitive Timed Up & Go increases <50% from standard TUG with AFO only.  (Target Date: 04/08/2015)   Time 2   Period Months   Status New               Plan - 02/19/15  1445    Clinical Impression Statement This 69yo has experienced a recent decline in mobility with increase balance deficits and falls. Patient reports his MD recommended an AFO and he wants to avoid an AFO for fear his muscles will atrophy. He has weakness associated with CVA in left UE & LE with impaired sterognosis and proprioception. Berg Balance score of 50/56 indicates fall risk. Gait deviations increase risk of falls with gait. PT will assess various AFOs to assess if improved safety and foot clearance & initial contact ankle /foot position. Cognitive TUG indicates fall risk when patient is distracted.                   Pt will benefit from skilled therapeutic intervention in order to improve on the following deficits Abnormal gait;Decreased activity tolerance;Decreased balance;Decreased coordination;Decreased endurance;Decreased mobility;Decreased range of motion;Decreased strength;Postural dysfunction;Pain   Rehab Potential Good   PT Frequency 2x / week   PT Duration 8 weeks   PT Treatment/Interventions ADLs/Self Care Home Management;DME Instruction;Gait training;Stair training;Functional mobility training;Therapeutic activities;Therapeutic exercise;Balance training;Neuromuscular re-education;Patient/family education;Orthotic Fit/Training;Manual techniques;Passive range of motion;Vestibular   PT Next Visit Plan Assess gait with AFOs ([place] Toe-off and [place] Reaction), Perform Functional Gait Assessment   Consulted and Agree with Plan of Care Patient;Family member/caregiver   Family Member Consulted wife          G-Codes - February 19, 2015 1445    Functional Assessment Tool Used Timed Up & Go 10.43sec; Cognitive TUG 20.09sec which is 92.6% increase and indicates fall risk when distracted   Functional Limitation Mobility: Walking and moving around   Mobility: Walking and Moving Around Current Status ([id number]) At least 20 percent but less than 40  percent impaired, limited or restricted   Mobility:  Walking and Moving Around Goal Status [phone]) At least 1 percent but less than 20 percent impaired, limited or restricted       Problem List Patient Active Problem List   Diagnosis Date Noted  . Ischemic stroke ([place]) 10/04/2014  . 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 Deep vein thrombosis ([place]) 10/04/2014  . Immune thrombocytopenic purpura ([place]) 10/04/2014  . LA (lupus anticoagulant) disorder ([place]) 10/04/2014  . Arteriosclerosis of coronary artery 09/10/2012  . Apnea, sleep 09/10/2012  . 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 Dermatophytic onychia 01/29/2012  . Spastic hemiplegia ([place]) 12/03/2011  . Hemiparesis, left ([place]) 10/31/2011  . Dysphonia 08/13/2011  . 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 ITP (idiopathic thrombocytopenic purpura) 08/12/2011    Jamey Reas PT, DPT 02/08/2015, 6:28 PM  [facility] [address] [place], [place], [address] Phone: [phone]   Fax:  [phone]  Name: Mark [name] MRN: [id number] Date of Birth: 05-24-1945

## 2015-02-09 ENCOUNTER — Other Ambulatory Visit (HOSPITAL_BASED_OUTPATIENT_CLINIC_OR_DEPARTMENT_OTHER): Payer: Commercial Managed Care - HMO

## 2015-02-09 DIAGNOSIS — D693 Immune thrombocytopenic purpura: Secondary | ICD-10-CM

## 2015-02-09 LAB — CBC WITH DIFFERENTIAL (CANCER CENTER ONLY)
BASO#: 0 10*3/uL (ref 0.0–0.2)
BASO%: 0.5 % (ref 0.0–2.0)
EOS ABS: 0.6 10*3/uL — AB (ref 0.0–0.5)
EOS%: 6.8 % (ref 0.0–7.0)
HEMATOCRIT: 46.6 % (ref 38.7–49.9)
HEMOGLOBIN: 15.6 g/dL (ref 13.0–17.1)
LYMPH#: 2.3 10*3/uL (ref 0.9–3.3)
LYMPH%: 26.3 % (ref 14.0–48.0)
MCH: 29.9 pg (ref 28.0–33.4)
MCHC: 33.5 g/dL (ref 32.0–35.9)
MCV: 89 fL (ref 82–98)
MONO#: 2 10*3/uL — ABNORMAL HIGH (ref 0.1–0.9)
MONO%: 22.3 % — AB (ref 0.0–13.0)
NEUT%: 44.1 % (ref 40.0–80.0)
NEUTROS ABS: 3.9 10*3/uL (ref 1.5–6.5)
Platelets: 173 10*3/uL (ref 145–400)
RBC: 5.21 10*6/uL (ref 4.20–5.70)
RDW: 14.6 % (ref 11.1–15.7)
WBC: 8.9 10*3/uL (ref 4.0–10.0)

## 2015-02-09 LAB — PROTIME-INR (CHCC SATELLITE)
INR: 3.5 (ref 2.0–3.5)
PROTIME: 42 s — AB (ref 10.6–13.4)

## 2015-02-14 ENCOUNTER — Ambulatory Visit: Payer: Commercial Managed Care - HMO | Attending: Neurology | Admitting: Physical Therapy

## 2015-02-14 DIAGNOSIS — I69898 Other sequelae of other cerebrovascular disease: Secondary | ICD-10-CM | POA: Insufficient documentation

## 2015-02-14 DIAGNOSIS — R2681 Unsteadiness on feet: Secondary | ICD-10-CM | POA: Insufficient documentation

## 2015-02-14 DIAGNOSIS — R269 Unspecified abnormalities of gait and mobility: Secondary | ICD-10-CM | POA: Diagnosis not present

## 2015-02-14 DIAGNOSIS — R531 Weakness: Secondary | ICD-10-CM | POA: Insufficient documentation

## 2015-02-14 DIAGNOSIS — Z5189 Encounter for other specified aftercare: Secondary | ICD-10-CM | POA: Insufficient documentation

## 2015-02-14 DIAGNOSIS — R29818 Other symptoms and signs involving the nervous system: Secondary | ICD-10-CM | POA: Diagnosis present

## 2015-02-14 DIAGNOSIS — IMO0002 Reserved for concepts with insufficient information to code with codable children: Secondary | ICD-10-CM

## 2015-02-14 NOTE — Therapy (Signed)
[place] [address] [place], [place], [address] Phone: [phone]   Fax:  [phone]  Physical Therapy Treatment  Patient Details  Name: Mark [name] MRN: [id number] Date of Birth: 1945/07/28 Referring Provider: Theone Murdoch, MD  Encounter Date: 02/14/2015      PT End of Session - 02/14/15 1932    Visit Number 2   Number of Visits 17   Date for PT Re-Evaluation 04/08/15   Authorization Type Medicare G-Code & progress report every 10 visits   PT Start Time 9924   PT Stop Time 1532   PT Time Calculation (min) 47 min   Activity Tolerance Patient tolerated treatment well   Behavior During Therapy [facility] for tasks assessed/performed      No past medical history on file.  No past surgical history on file.  There were no vitals filed for this visit.  Visit Diagnosis:  Abnormality of gait  Unsteadiness  Weakness due to cerebrovascular accident      Subjective Assessment - 02/14/15 1912    Subjective Pt/wife explaining that pt has never had difficulty with spasticity/tightness in LUE/LE. Pt explains recent that neurologist unsure of reason for recent onset of sensory/propioceptive loss in LLE.   Patient is accompained by: Family member   Patient Stated Goals He wants to avoid use of AFO & increase coordination of left lower extremity.    Currently in Pain? No/denies            [facility] PT Assessment - 02/14/15 0001    Posture/Postural Control   Posture Comments L foot/ankle posture characterized by L forefoot supination, plantarflexion; pt able to self-correct to neutral when cued.   Tone   Assessment Location --  No velocity-dependent tone noted in LLE   Functional Gait  Assessment   Gait assessed  Yes   Gait Level Surface Walks 20 ft, slow speed, abnormal gait pattern, evidence for imbalance or deviates 10-15 in outside of the 12 in walkway width. Requires more than 7 sec to ambulate 20 ft.   7.72 sec   Change in Gait Speed Able to smoothly change walking speed without loss of balance or gait deviation. Deviate no more than 6 in outside of the 12 in walkway width.   Gait with Horizontal Head Turns Performs head turns smoothly with no change in gait. Deviates no more than 6 in outside 12 in walkway width   Gait with Vertical Head Turns Performs task with slight change in gait velocity (eg, minor disruption to smooth gait path), deviates 6 - 10 in outside 12 in walkway width or uses assistive device   Gait and Pivot Turn Pivot turns safely in greater than 3 sec and stops with no loss of balance, or pivot turns safely within 3 sec and stops with mild imbalance, requires small steps to catch balance.   Step Over Obstacle Is able to step over one shoe box (4.5 in total height) without changing gait speed. No evidence of imbalance.   Gait with Narrow Base of Support Ambulates less than 4 steps heel to toe or cannot perform without assistance.   Gait with Eyes Closed Walks 20 ft, slow speed, abnormal gait pattern, evidence for imbalance, deviates 10-15 in outside 12 in walkway width. Requires more than 9 sec to ambulate 20 ft.   Ambulating Backwards Walks 20 ft, slow speed, abnormal gait pattern, evidence for imbalance, deviates 10-15 in outside 12 in walkway width.   Steps Alternating feet, must use  rail.   Total Score 17                     [facility] Adult PT Treatment/Exercise - 02/14/15 0001    Ambulation/Gait   Ambulation/Gait Yes   Ambulation/Gait Assistance 5: Supervision   Ambulation Distance (Feet) 300 Feet  total   Assistive device None   Gait Pattern Decreased arm swing - left;Left genu recurvatum;Poor foot clearance - left;Decreased dorsiflexion - left;Step-through pattern;Decreased stance time - left;Decreased step length - right;Decreased hip/knee flexion - left;Left foot flat;Trunk flexed  L toe in    Ambulation Surface Level;Indoor   Gait Comments During gait  without AFO, noted decreased L ankle DF, inconsistent LLE clearance, and no noted L genu recurvatum. Using L PLS AFO (WalkOn), noted improved ankle DF, consistent LLE clearance, decreased lateral weight shift to L, and limited anterior weight shift, and increased LUE spasticity (possibly due to impaired weight shift and pt discomfort). No L ToeOff brace in pt size; therefore, trialed L BlueRocker, with noted improvement in L ankle DF, LLE clearance, improved weight shift (anterior and lateral), decreased LLE spasticity/hypertonia, and pt report of increased comfort.                PT Education - 02/14/15 1921    Education provided Yes   Education Details FGA findings and implicated fall risk. Recommending non-elastic shoe laces for future PT sessions to maximize effectiveness of AFO.   Person(s) Educated Patient;Spouse   Methods Explanation   Comprehension Verbalized understanding          PT Short Term Goals - 02/07/15 1600    PT SHORT TERM GOAL #1   Title Pt will decrease TUG-COG score from 20.09 to <17.5 seconds to indicate a decreased fall risk as a community dwelling older adult. (Target Date: 03/09/2015)   Time 1   Period Months   Status New   PT SHORT TERM GOAL #2   Title Patient and wife verbalize understanding of AFO recommendation and rationale.  (Target Date: 03/09/2015)   Time 1   Period Months   Status New   PT SHORT TERM GOAL #3   Title Pt will demonstrate understanding of initial HEP.    Time 1   Period Months   Status New           PT Long Term Goals - 02/07/15 1525    PT LONG TERM GOAL #1   Title Patient and wife demonstrate and verbalize understanding of ongoing fitness plan.  (Target Date: 04/08/2015)   Time 2   Period Months   Status New   PT LONG TERM GOAL #2   Title Patient ambulates 500' on outside surfaces including grass modified independent with AFO only.  (Target Date: 04/08/2015)   Time 2   Period Months   Status New   PT LONG TERM  GOAL #3   Title Patient negotiates ramps, curbs and stairs (1 rail) with AFO only modified independent.  (Target Date: 04/08/2015)   Time 2   Period Months   Status New   PT LONG TERM GOAL #4   Title Berg Balance >/= 53/56 to decrease fall risk  (Target Date: 04/08/2015)   Time 2   Period Months   Status New   PT LONG TERM GOAL #5   Title Cognitive Timed Up & Go increases <50% from standard TUG with AFO only.  (Target Date: 04/08/2015)   Time 2   Period Months   Status New  Plan - 02/14/15 1934    Clinical Impression Statement Pt scored 17/30 on FGA, suggesting increased fall risk. In addition to gait impairments described on PT eval, recent onset of impaired LLE sensation/proprioception has affected weight shift during transfers/gait, therefore causing LUE hypertonia. Pt comfort, LUE hypertonia, and gait pattern with Blue Rocker > PLS (WalkOn); however, noted L genu recurvatum  using BlueRocker. Contacted orthotist to request L ToeOff brace in pt's size to trial at next session.   Pt will benefit from skilled therapeutic intervention in order to improve on the following deficits Abnormal gait;Decreased activity tolerance;Decreased balance;Decreased coordination;Decreased endurance;Decreased mobility;Decreased range of motion;Decreased strength;Postural dysfunction;Pain   Rehab Potential Good   PT Frequency 2x / week   PT Duration 8 weeks   PT Treatment/Interventions ADLs/Self Care Home Management;DME Instruction;Gait training;Stair training;Functional mobility training;Therapeutic activities;Therapeutic exercise;Balance training;Neuromuscular re-education;Patient/family education;Orthotic Fit/Training;Manual techniques;Passive range of motion;Vestibular   PT Next Visit Plan Initiate HEP with focus on stretching to prevent further shortening of L gastroc. Consider L ASO for proprioceptive input.    Recommended Other Services 11/1: Orthotist Marcello Moores to plans to bring L Toe  Off brace to clinic if any in stock.   Consulted and Agree with Plan of Care Patient;Family member/caregiver   Family Member Consulted wife, [name]        Problem List Patient Active Problem List   Diagnosis Date Noted  . Ischemic stroke ([place]) 10/04/2014  . 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 Deep vein thrombosis ([place]) 10/04/2014  . 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 LA (lupus anticoagulant) disorder ([place]) 10/04/2014  . Arteriosclerosis of coronary artery 09/10/2012  . Apnea, sleep 09/10/2012  . 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 Dermatophytic onychia 01/29/2012  . Spastic hemiplegia ([place]) 12/03/2011  . Hemiparesis, left ([place]) 10/31/2011  . Dysphonia 08/13/2011  . 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 ITP (idiopathic thrombocytopenic purpura) 08/12/2011    Billie Ruddy, PT, DPT [facility] [address] [place], [place], [address] Phone: [phone]   Fax:  [phone] 02/14/2015, 7:46 PM   Name: COULSON WEHNER MRN: [id number] Date of Birth: 02-01-1946

## 2015-02-16 ENCOUNTER — Ambulatory Visit: Payer: Commercial Managed Care - HMO | Admitting: Physical Therapy

## 2015-02-16 DIAGNOSIS — R2681 Unsteadiness on feet: Secondary | ICD-10-CM

## 2015-02-16 DIAGNOSIS — R269 Unspecified abnormalities of gait and mobility: Secondary | ICD-10-CM

## 2015-02-16 DIAGNOSIS — Z4789 Encounter for other orthopedic aftercare: Secondary | ICD-10-CM

## 2015-02-16 NOTE — Therapy (Signed)
[place] [address] [place], [place], [address] Phone: [phone]   Fax:  [phone]  Physical Therapy Treatment  Patient Details  Name: Mark [name] MRN: [id number] Date of Birth: October 21, 1945 Referring Provider: Theone Murdoch, MD  Encounter Date: 02/16/2015      PT End of Session - 02/16/15 1640    Visit Number 3   Number of Visits 17   Date for PT Re-Evaluation 04/08/15   Authorization Type Medicare G-Code & progress report every 10 visits   PT Start Time 1502  Pt arrived late to session   PT Stop Time 1550   PT Time Calculation (min) 48 min   Activity Tolerance Patient tolerated treatment well   Behavior During Therapy [facility] for tasks assessed/performed      No past medical history on file.  No past surgical history on file.  There were no vitals filed for this visit.  Visit Diagnosis:  Abnormality of gait  Unsteadiness  Encounter for training in use of orthotic device      Subjective Assessment - 02/16/15 1611    Subjective Pt reports no significant changes since last session. Wife changed elastic shoe strings, as recommended, to increase effectiveness of AFO's.   Patient is accompained by: Family member   Patient Stated Goals He wants to avoid use of AFO & increase coordination of left lower extremity.    Currently in Pain? No/denies                         [facility] Adult PT Treatment/Exercise - 02/16/15 0001    Ambulation/Gait   Ambulation/Gait Yes   Ambulation/Gait Assistance 6: Modified independent (Device/Increase time);5: Supervision   Ambulation/Gait Assistance Details Supervision, cueing for decreased LLE step length to control L genu recurvatum when ambulating uphill.   Ambulation Distance (Feet) 1000 Feet  x2   Assistive device None   Gait Pattern Decreased arm swing - left;Left genu recurvatum;Poor foot clearance - left;Decreased dorsiflexion - left;Step-through  pattern;Decreased stance time - left;Decreased step length - right;Decreased hip/knee flexion - left;Left foot flat;Trunk flexed  L toe in    Ambulation Surface Level;Unlevel;Indoor;Outdoor;Paved;Grass   Gait Comments Gait training 1,000' x2 over unlevel, outdoor surfaces to assess/address change in gait pattern with fatigue and unlevel terrain. During initial trial with L Reaction AFO and supination control strap, noted consistent L LE clearance, improved L hip/knee flexion during LLE advancement, improved foot/ankle posture (less forefoot supination); however, noted L tibial internal rotation and L genu recurvatum when ascending inclined sidewalk. During subsequent trial wearing L PLS AFO with supination control strap, noted consistent L ankle DF, improved LLE clearance (as compared with no brace), and less L tibial internal rotatoin (as compared with Reaction brace); but when ascending inclined sidewalk, noted L toe catch x2 and L genu recurvatum. Cueing focused on decreasing LLE step length with gait over inclined surface and correcting L posterior rotation of trunk; tactile cueing focused on L pelvic protraction and increasing anterior weight shift.                       PT Education - 02/16/15 1620    Education provided Yes   Education Details AFO options. Risks of repetitive genu recurvatum. Plan for orthotist consult to determine best option.   Person(s) Educated Patient;Spouse   Methods Explanation   Comprehension Verbalized understanding          PT Short Term Goals -  02/07/15 1600    PT SHORT TERM GOAL #1   Title Pt will decrease TUG-COG score from 20.09 to <17.5 seconds to indicate a decreased fall risk as a community dwelling older adult. (Target Date: 03/09/2015)   Time 1   Period Months   Status New   PT SHORT TERM GOAL #2   Title Patient and wife verbalize understanding of AFO recommendation and rationale.  (Target Date: 03/09/2015)   Time 1   Period Months    Status New   PT SHORT TERM GOAL #3   Title Pt will demonstrate understanding of initial HEP.    Time 1   Period Months   Status New           PT Long Term Goals - 02/07/15 1525    PT LONG TERM GOAL #1   Title Patient and wife demonstrate and verbalize understanding of ongoing fitness plan.  (Target Date: 04/08/2015)   Time 2   Period Months   Status New   PT LONG TERM GOAL #2   Title Patient ambulates 500' on outside surfaces including grass modified independent with AFO only.  (Target Date: 04/08/2015)   Time 2   Period Months   Status New   PT LONG TERM GOAL #3   Title Patient negotiates ramps, curbs and stairs (1 rail) with AFO only modified independent.  (Target Date: 04/08/2015)   Time 2   Period Months   Status New   PT LONG TERM GOAL #4   Title Berg Balance >/= 53/56 to decrease fall risk  (Target Date: 04/08/2015)   Time 2   Period Months   Status New   PT LONG TERM GOAL #5   Title Cognitive Timed Up & Go increases <50% from standard TUG with AFO only.  (Target Date: 04/08/2015)   Time 2   Period Months   Status New               Plan - 02/16/15 1625    Clinical Impression Statement Session focused on assessing pt gait pattern with use of L carbon fiber AFO's. Gait training performed outdoors for prolonged distances (>1,000') to assess effect of uneven terrain and fatigue on gait pattern using each brace. Gait most stable and efficient with L Reaction and L PLS AFO, both with use of supination control strap. Pt with difficulty controlling L genu recurvatum when ambulating uphill using both braces. Will continue to assess.   Pt will benefit from skilled therapeutic intervention in order to improve on the following deficits Abnormal gait;Decreased activity tolerance;Decreased balance;Decreased coordination;Decreased endurance;Decreased mobility;Decreased range of motion;Decreased strength;Postural dysfunction;Pain   Rehab Potential Good   PT Frequency 2x /  week   PT Duration 8 weeks   PT Treatment/Interventions ADLs/Self Care Home Management;DME Instruction;Gait training;Stair training;Functional mobility training;Therapeutic activities;Therapeutic exercise;Balance training;Neuromuscular re-education;Patient/family education;Orthotic Fit/Training;Manual techniques;Passive range of motion;Vestibular   PT Next Visit Plan Initiate HEP. Trial heel wedge in L Reaction, if time.   Recommended Other Services 11/3: Orthotist Marcello Moores to be present at session on 11/10 by 2:30 pm.   Consulted and Agree with Plan of Care Patient;Family member/caregiver   Family Member Consulted wife, [name]        Problem List Patient Active Problem List   Diagnosis Date Noted  . Ischemic stroke ([employer]) 10/04/2014  . 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 Deep vein thrombosis ([place]) 10/04/2014  . 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 LA (lupus anticoagulant) disorder ([place]) 10/04/2014  . Arteriosclerosis of coronary  artery 09/10/2012  . Apnea, sleep 09/10/2012  . 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 Dermatophytic onychia 01/29/2012  . Spastic hemiplegia ([place]) 12/03/2011  . Hemiparesis, left ([place]) 10/31/2011  . Dysphonia 08/13/2011  . 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 [place], [place], [address] Phone: [phone]   Fax:  [phone] 02/16/2015, 4:50 PM   Name: Mark [name] MRN: [id number] Date of Birth: [birth year]

## 2015-02-21 ENCOUNTER — Ambulatory Visit: Payer: Commercial Managed Care - HMO | Admitting: Physical Therapy

## 2015-02-21 DIAGNOSIS — R269 Unspecified abnormalities of gait and mobility: Secondary | ICD-10-CM | POA: Diagnosis not present

## 2015-02-21 DIAGNOSIS — R2681 Unsteadiness on feet: Secondary | ICD-10-CM

## 2015-02-21 DIAGNOSIS — IMO0002 Reserved for concepts with insufficient information to code with codable children: Secondary | ICD-10-CM

## 2015-02-21 NOTE — Therapy (Signed)
[facility] [address] [place], [place], [address] Phone: [phone]   Fax:  [phone]  Physical Therapy Treatment  Patient Details  Name: Mark [name] MRN: [id number] Date of Birth: 1945/10/29 Referring Provider: Theone Murdoch, MD  Encounter Date: 02/21/2015      PT End of Session - 02/21/15 1817    Visit Number 4   Number of Visits 17   Date for PT Re-Evaluation 04/08/15   Authorization Type Medicare G-Code & progress report every 10 visits   PT Start Time 1455  Pt arrived late to session   PT Stop Time 1533   PT Time Calculation (min) 38 min   Activity Tolerance Patient tolerated treatment well   Behavior During Therapy [facility] for tasks assessed/performed      No past medical history on file.  No past surgical history on file.  There were no vitals filed for this visit.  Visit Diagnosis:  Abnormality of gait  Unsteadiness  Weakness due to cerebrovascular accident      Subjective Assessment - 02/21/15 1500    Subjective Pt reports no significant changes since last session. Pt reports, "This past Saturday, I did walk for about an hour and a half over level surfaces. I had my friend, Legrand Como, with me."   Patient is accompained by: Family member  wife   Patient Stated Goals He wants to avoid use of AFO & increase coordination of left lower extremity.    Currently in Pain? No/denies                         [facility] Adult PT Treatment/Exercise - 02/21/15 0001    Neuro Re-ed    Neuro Re-ed Details  To increase distal activation/control and proprioceptive input in L ankle, pt performed functional reaching outside BOS in L half kneeling. Due to pt safety/stability, explained and demonstrated to pt/wife how to safely perform dynamic activity in L half kneeling at home; cueing focused on L ankle joint protection (preventing excessive L forefoot supination). Supervision required for  transition from tall kneeling <> L half kneeling to ensure safe ankle position.   Exercises   Exercises Other Exercises   Other Exercises  Supine BLE bridging using phyio (peanut) ball x12 reps. With explanation, demonstration from this PT, pt performed the following home exercises: L single limb bridge 3 x8 reps; supine L SLR 3 x10 reps with cueing to prevent extension lag; and green T-band resisted L ankle eversion 3 x15 reps. All exercises performed to pt fatigue. See Pt Instructions for details.                PT Education - 02/21/15 1504    Education provided Yes   Education Details HEP initiated. See Pt Instructions.   Person(s) Educated Patient;Spouse   Methods Explanation;Demonstration;Tactile cues;Verbal cues;Handout   Comprehension Verbalized understanding;Returned demonstration          PT Short Term Goals - 02/07/15 1600    PT SHORT TERM GOAL #1   Title Pt will decrease TUG-COG score from 20.09 to <17.5 seconds to indicate a decreased fall risk as a community dwelling older adult. (Target Date: 03/09/2015)   Time 1   Period Months   Status New   PT SHORT TERM GOAL #2   Title Patient and wife verbalize understanding of AFO recommendation and rationale.  (Target Date: 03/09/2015)   Time 1   Period Months   Status New  PT SHORT TERM GOAL #3   Title Pt will demonstrate understanding of initial HEP.    Time 1   Period Months   Status New           PT Long Term Goals - 02/07/15 1525    PT LONG TERM GOAL #1   Title Patient and wife demonstrate and verbalize understanding of ongoing fitness plan.  (Target Date: 04/08/2015)   Time 2   Period Months   Status New   PT LONG TERM GOAL #2   Title Patient ambulates 500' on outside surfaces including grass modified independent with AFO only.  (Target Date: 04/08/2015)   Time 2   Period Months   Status New   PT LONG TERM GOAL #3   Title Patient negotiates ramps, curbs and stairs (1 rail) with AFO only modified  independent.  (Target Date: 04/08/2015)   Time 2   Period Months   Status New   PT LONG TERM GOAL #4   Title Berg Balance >/= 53/56 to decrease fall risk  (Target Date: 04/08/2015)   Time 2   Period Months   Status New   PT LONG TERM GOAL #5   Title Cognitive Timed Up & Go increases <50% from standard TUG with AFO only.  (Target Date: 04/08/2015)   Time 2   Period Months   Status New               Plan - 02/21/15 1817    Clinical Impression Statement Initiated HEP with focus on functional LLE strengthening, increasing proprioceptive input in LLE. Pt motivated and wife very engaged throughout session.   Pt will benefit from skilled therapeutic intervention in order to improve on the following deficits Abnormal gait;Decreased activity tolerance;Decreased balance;Decreased coordination;Decreased endurance;Decreased mobility;Decreased range of motion;Decreased strength;Postural dysfunction;Pain   Rehab Potential Good   PT Frequency 2x / week   PT Duration 8 weeks   PT Treatment/Interventions ADLs/Self Care Home Management;DME Instruction;Gait training;Stair training;Functional mobility training;Therapeutic activities;Therapeutic exercise;Balance training;Neuromuscular re-education;Patient/family education;Orthotic Fit/Training;Manual techniques;Passive range of motion;Vestibular   PT Next Visit Plan Orthotist planned to be present by end of next session. Gait pattern most stable/efficient with L Reaction and L PLS (WalkOn), both using supination control strap.   Consulted and Agree with Plan of Care Patient;Family member/caregiver   Family Member Consulted wife, [name]        Problem List Patient Active Problem List   Diagnosis Date Noted  . Ischemic stroke ([place]) 10/04/2014  . 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 Deep vein thrombosis ([place]) 10/04/2014  . 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 LA (lupus anticoagulant) disorder ([place]) 10/04/2014  .  Arteriosclerosis of coronary artery 09/10/2012  . Apnea, sleep 09/10/2012  . 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 Dermatophytic onychia 01/29/2012  . Spastic hemiplegia ([place]) 12/03/2011  . Hemiparesis, left ([place]) 10/31/2011  . Dysphonia 08/13/2011  . 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

## 2015-02-21 NOTE — Patient Instructions (Addendum)
Bridging (Single Leg)    Lie on back with feet shoulder width apart and right leg straight. Use your LEFT leg to lift hips toward the ceiling while keeping leg straight. Hold __2__ seconds. Slowly lower back to starting position. Repeat __8__ times. Do __3__ sessions per day.  'Hip Flexion / Knee Extension: Straight-Leg Raise (Eccentric)    Lie on back. Lift LEFT leg with knee straight. Slowly lower leg for 3-5 seconds. Make sure the back of your knee and the back of your heel touch the bed at the same time. __10_ reps per set, __3_ sets per day.     Loop GREEN band around LEFT forefoot. Step on band with right foot and hold the other end of the band, as pictured. Perform 15 reps, 3 times per day on the left leg. Make sure you look at the left foot during the exercise.   Half Kneeling   With wife's help, transition from tall kneeling (on both knees) to left half kneeling (left leg in front). Make sure ankle is in neutral position (as discussed in PT). Perform exercise discussed in PT (provided by eye doctor) for 5 consecutive minutes to challenge balance.

## 2015-02-23 ENCOUNTER — Encounter: Payer: Self-pay | Admitting: Physical Therapy

## 2015-02-23 ENCOUNTER — Ambulatory Visit: Payer: Commercial Managed Care - HMO | Admitting: Physical Therapy

## 2015-02-23 DIAGNOSIS — R2689 Other abnormalities of gait and mobility: Secondary | ICD-10-CM

## 2015-02-23 DIAGNOSIS — R269 Unspecified abnormalities of gait and mobility: Secondary | ICD-10-CM

## 2015-02-23 DIAGNOSIS — R2681 Unsteadiness on feet: Secondary | ICD-10-CM

## 2015-02-23 DIAGNOSIS — Z4789 Encounter for other orthopedic aftercare: Secondary | ICD-10-CM

## 2015-02-23 DIAGNOSIS — IMO0002 Reserved for concepts with insufficient information to code with codable children: Secondary | ICD-10-CM

## 2015-02-25 NOTE — Therapy (Signed)
[facility] [address] [place], [place], [address] Phone: [phone]   Fax:  [phone]  Physical Therapy Treatment  Patient Details  Name: Mark [name] MRN: [id number] Date of Birth: 10-25-1945 Referring Provider: Theone Murdoch, MD  Encounter Date: 02/23/2015      PT End of Session - 02/25/15 0608    Visit Number 5   Number of Visits 17   Date for PT Re-Evaluation 04/08/15   Authorization Type Medicare G-Code & progress report every 10 visits   PT Start Time 1405   PT Stop Time 1525   PT Time Calculation (min) 80 min   Equipment Utilized During Treatment Gait belt   Activity Tolerance Patient tolerated treatment well   Behavior During Therapy [facility] for tasks assessed/performed      History reviewed. No pertinent past medical history.  History reviewed. No pertinent past surgical history.  There were no vitals filed for this visit.  Visit Diagnosis:  Abnormality of gait  Unsteadiness  Weakness due to cerebrovascular accident  Encounter for training in use of orthotic device  Balance problems      Subjective Assessment - 02/25/15 0608    Subjective No falls since last appointment.    Patient is accompained by: Family member     Therapeutic Exercise: See pt education. PT instructed in using sheet tied in loop for seated gastroc stretch due to decreased left UE function. PT instructed to hold 20-30 sec 3 reps and perform 3-5 times /day during commercials while watching TV.  Orthotic Training:  PT assessed gait without AFO as he ambulated from lobby to gym area with poor clearance in swing catching left foot 4 times over 100', initial contact with lateral heel with inversion and toe-in (more hip internal rotation with pelvic retraction) which impairs ability to safely accept weight on left LE, stance with supination of foot placing him at risk of lateral ankle injuries and knee recurvatum.  PT  assessed gait with Left Ottobock Reaction AFO with T-strap (design of this AFO assists clearance issues, supination and knee instability noted above in Mr. Conatser' gait). Patient ambulated 100' X 6 with PT assessing, instructing / explaining to pt & his wife what PT observed. (patient continued to work with orthotist after PT worked with another pt and PT gave occasional comments - No charge for this time) Orthotist determined that large over medium size improved gait control. Patient's gait with AFO had improved clearance with no catches of foot in swing, initial contact with slight inversion, posterior heel and stance with no supination motion noted and no knee recurvaturm. PT cued patient to control hip internal rotation as AFO would not control this. He needs further instruction in pelvic motion (rotation) with gait.                             PT Education - 02/25/15 [phone]    Education provided Yes   Education Details seated gastroc stretch both with knee flexed and extended   Person(s) Educated Patient;Spouse   Methods Explanation;Demonstration;Tactile cues;Verbal cues   Comprehension Verbalized understanding;Returned demonstration;Verbal cues required;Tactile cues required          PT Short Term Goals - 02/07/15 1600    PT SHORT TERM GOAL #1   Title Pt will decrease TUG-COG score from 20.09 to <17.5 seconds to indicate a decreased fall risk as a community dwelling older adult. (Target Date: 03/09/2015)  Time 1   Period Months   Status New   PT SHORT TERM GOAL #2   Title Patient and wife verbalize understanding of AFO recommendation and rationale.  (Target Date: 03/09/2015)   Time 1   Period Months   Status New   PT SHORT TERM GOAL #3   Title Pt will demonstrate understanding of initial HEP.    Time 1   Period Months   Status New           PT Long Term Goals - 02/07/15 1525    PT LONG TERM GOAL #1   Title Patient and wife demonstrate and verbalize  understanding of ongoing fitness plan.  (Target Date: 04/08/2015)   Time 2   Period Months   Status New   PT LONG TERM GOAL #2   Title Patient ambulates 500' on outside surfaces including grass modified independent with AFO only.  (Target Date: 04/08/2015)   Time 2   Period Months   Status New   PT LONG TERM GOAL #3   Title Patient negotiates ramps, curbs and stairs (1 rail) with AFO only modified independent.  (Target Date: 04/08/2015)   Time 2   Period Months   Status New   PT LONG TERM GOAL #4   Title Berg Balance >/= 53/56 to decrease fall risk  (Target Date: 04/08/2015)   Time 2   Period Months   Status New   PT LONG TERM GOAL #5   Title Cognitive Timed Up & Go increases <50% from standard TUG with AFO only.  (Target Date: 04/08/2015)   Time 2   Period Months   Status New               Plan - 02/25/15 0609    Clinical Impression Statement Patient appears would benefit most from a Ottobock Reaction AFO with a T-strap. He has improved clearance in swing, proper ankle position at initial contact to accept weight and less supination & knee flexion in stance. Patient and wife also reported noted improvement.    Pt will benefit from skilled therapeutic intervention in order to improve on the following deficits Abnormal gait;Decreased activity tolerance;Decreased balance;Decreased coordination;Decreased endurance;Decreased mobility;Decreased range of motion;Decreased strength;Postural dysfunction;Pain   Rehab Potential Good   PT Frequency 2x / week   PT Duration 8 weeks   PT Treatment/Interventions ADLs/Self Care Home Management;DME Instruction;Gait training;Stair training;Functional mobility training;Therapeutic activities;Therapeutic exercise;Balance training;Neuromuscular re-education;Patient/family education;Orthotic Fit/Training;Manual techniques;Passive range of motion;Vestibular   PT Next Visit Plan Continue toward LTGs. Gait with AFO including working on pelvic motion.   instruct in standing heelcord stretches and add hamstring stretches and pelvic rotation exercise.    Consulted and Agree with Plan of Care Patient;Family member/caregiver   Family Member Consulted wife, [name]        Problem List Patient Active Problem List   Diagnosis Date Noted  . Ischemic stroke ([place]) 10/04/2014  . 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 Deep vein thrombosis ([place]) 10/04/2014  . 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 LA (lupus anticoagulant) disorder ([place]) 10/04/2014  . Arteriosclerosis of coronary artery 09/10/2012  . Apnea, sleep 09/10/2012  . 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 Dermatophytic onychia 01/29/2012  . Spastic hemiplegia ([place]) 12/03/2011  . Hemiparesis, left ([place]) 10/31/2011  . Dysphonia 08/13/2011  . 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 [place],  [place], [address] Phone: [phone]   Fax:  [phone]  Name: MCKYLE TORTORA MRN: [id number] Date of Birth: [birth year]

## 2015-02-27 ENCOUNTER — Other Ambulatory Visit: Payer: Self-pay | Admitting: Hematology & Oncology

## 2015-02-28 ENCOUNTER — Ambulatory Visit: Payer: Commercial Managed Care - HMO | Admitting: Physical Therapy

## 2015-02-28 ENCOUNTER — Encounter: Payer: Self-pay | Admitting: Physical Therapy

## 2015-02-28 ENCOUNTER — Other Ambulatory Visit: Payer: Self-pay | Admitting: Hematology & Oncology

## 2015-02-28 DIAGNOSIS — Z4789 Encounter for other orthopedic aftercare: Secondary | ICD-10-CM

## 2015-02-28 DIAGNOSIS — R269 Unspecified abnormalities of gait and mobility: Secondary | ICD-10-CM

## 2015-02-28 DIAGNOSIS — R2681 Unsteadiness on feet: Secondary | ICD-10-CM

## 2015-02-28 DIAGNOSIS — IMO0002 Reserved for concepts with insufficient information to code with codable children: Secondary | ICD-10-CM

## 2015-02-28 DIAGNOSIS — R2689 Other abnormalities of gait and mobility: Secondary | ICD-10-CM

## 2015-02-28 NOTE — Therapy (Signed)
[facility] [address] [place], [place], [address] Phone: [phone]   Fax:  [phone]  Physical Therapy Treatment  Patient Details  Name: Mark [name] MRN: [id number] Date of Birth: Feb 19, 1946 Referring Provider: Theone Murdoch, MD  Encounter Date: 02/28/2015      PT End of Session - 02/28/15 1400    Visit Number 6   Number of Visits 17   Date for PT Re-Evaluation 04/08/15   Authorization Type Medicare G-Code & progress report every 10 visits   PT Start Time 1322   PT Stop Time 1400   PT Time Calculation (min) 38 min   Equipment Utilized During Treatment Gait belt   Activity Tolerance Patient tolerated treatment well   Behavior During Therapy [facility] for tasks assessed/performed      History reviewed. No pertinent past medical history.  History reviewed. No pertinent past surgical history.  There were no vitals filed for this visit.  Visit Diagnosis:  Abnormality of gait  Unsteadiness  Weakness due to cerebrovascular accident  Encounter for training in use of orthotic device  Balance problems      Subjective Assessment - 02/28/15 1327    Subjective He is ready to get the brace as he feels it helped a lot. No falls. Wife requesting to reduce frequency due to financial issues.    Patient is accompained by: Family member   Currently in Pain? No/denies     Orthotic Training with Ottobock Reaction AFO with T-strap: See pt education Patient ambulated 500' with AFO only with cues on decreasing hip internal rotation. Patient may also benefit from leather toe cap if terminal stance resistance is decreasing LLE clearance. PT instructed pt & wife in shoe design including recommendation for velcro closure to improve independence with pt donning. Both verbalized understanding. Patient ambulated in hall for visual reference working on scanning (right-left, up-down, up-right to down-left and up-left to  down-right) with tactile & verbal cues to maintain path & pace. Pt and wife verbalize how to practice at home with her assistance. PT demo stepping right & left with hip external rotation with weight shift. Patient return demo with verbal cues. Progressed to standing at counter, stepping to side with weight shift and walking along counter without back stepping or posterior weight shifts to re-orient LEs for direction changes.                             PT Education - 02/28/15 1400    Education provided Yes   Education Details compliance with AFO wear once he is used to it; initial wear 4 hrs 2x/day adding 1 hr/day until tolerates wear all awake hours except bathing. Skin checking. HEP for gait in hallway with head turns, stepping alternate LE to side with pelvic & hip External rotation with weight shift;    Person(s) Educated Patient;Spouse   Methods Explanation;Demonstration;Verbal cues   Comprehension Verbalized understanding;Returned demonstration;Verbal cues required;Tactile cues required;Need further instruction          PT Short Term Goals - 02/07/15 1600    PT SHORT TERM GOAL #1   Title Pt will decrease TUG-COG score from 20.09 to <17.5 seconds to indicate a decreased fall risk as a community dwelling older adult. (Target Date: 03/09/2015)   Time 1   Period Months   Status New   PT SHORT TERM GOAL #2   Title Patient and wife verbalize understanding of AFO recommendation and  rationale.  (Target Date: 03/09/2015)   Time 1   Period Months   Status New   PT SHORT TERM GOAL #3   Title Pt will demonstrate understanding of initial HEP.    Time 1   Period Months   Status New           PT Long Term Goals - 02/07/15 1525    PT LONG TERM GOAL #1   Title Patient and wife demonstrate and verbalize understanding of ongoing fitness plan.  (Target Date: 04/08/2015)   Time 2   Period Months   Status New   PT LONG TERM GOAL #2   Title Patient ambulates 500' on  outside surfaces including grass modified independent with AFO only.  (Target Date: 04/08/2015)   Time 2   Period Months   Status New   PT LONG TERM GOAL #3   Title Patient negotiates ramps, curbs and stairs (1 rail) with AFO only modified independent.  (Target Date: 04/08/2015)   Time 2   Period Months   Status New   PT LONG TERM GOAL #4   Title Berg Balance >/= 53/56 to decrease fall risk  (Target Date: 04/08/2015)   Time 2   Period Months   Status New   PT LONG TERM GOAL #5   Title Cognitive Timed Up & Go increases <50% from standard TUG with AFO only.  (Target Date: 04/08/2015)   Time 2   Period Months   Status New               Plan - 02/28/15 1400    Clinical Impression Statement patient improved gait with head turns with tactile & verbal cues on maintaining path & pace. He had improved clearance and safety with AFO use even with activities of scanning and direction changes.    Pt will benefit from skilled therapeutic intervention in order to improve on the following deficits Abnormal gait;Decreased activity tolerance;Decreased balance;Decreased coordination;Decreased endurance;Decreased mobility;Decreased range of motion;Decreased strength;Postural dysfunction;Pain   Rehab Potential Good   PT Frequency 2x / week   PT Duration 8 weeks   PT Treatment/Interventions ADLs/Self Care Home Management;DME Instruction;Gait training;Stair training;Functional mobility training;Therapeutic activities;Therapeutic exercise;Balance training;Neuromuscular re-education;Patient/family education;Orthotic Fit/Training;Manual techniques;Passive range of motion;Vestibular   PT Next Visit Plan Assess STGs next week. Continue toward LTGs. Gait with AFO including working on pelvic motion.  instruct in standing heelcord stretches and add hamstring stretches and pelvic rotation exercise.    Consulted and Agree with Plan of Care Patient;Family member/caregiver   Family Member Consulted wife, [name]         Problem List Patient Active Problem List   Diagnosis Date Noted  . Ischemic stroke ([place]) 10/04/2014  . 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 Deep vein thrombosis ([place]) 10/04/2014  . 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 LA (lupus anticoagulant) disorder ([place]) 10/04/2014  . Arteriosclerosis of coronary artery 09/10/2012  . Apnea, sleep 09/10/2012  . 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 Dermatophytic onychia 01/29/2012  . Spastic hemiplegia ([place]) 12/03/2011  . Hemiparesis, left ([place]) 10/31/2011  . Dysphonia 08/13/2011  . 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 [place], [place], [address] Phone: [phone]   Fax:  [phone]  Name: DEKLAN ROZELL MRN: [id number] Date of Birth: [birth year]

## 2015-03-01 ENCOUNTER — Other Ambulatory Visit: Payer: Self-pay | Admitting: *Deleted

## 2015-03-02 ENCOUNTER — Other Ambulatory Visit (HOSPITAL_BASED_OUTPATIENT_CLINIC_OR_DEPARTMENT_OTHER): Payer: Commercial Managed Care - HMO

## 2015-03-02 ENCOUNTER — Ambulatory Visit (HOSPITAL_BASED_OUTPATIENT_CLINIC_OR_DEPARTMENT_OTHER): Payer: Commercial Managed Care - HMO | Admitting: Hematology & Oncology

## 2015-03-02 VITALS — BP 150/77 | HR 72 | Temp 98.0°F | Resp 20 | Wt 199.5 lb

## 2015-03-02 DIAGNOSIS — D693 Immune thrombocytopenic purpura: Secondary | ICD-10-CM | POA: Diagnosis not present

## 2015-03-02 DIAGNOSIS — Z8673 Personal history of transient ischemic attack (TIA), and cerebral infarction without residual deficits: Secondary | ICD-10-CM

## 2015-03-02 DIAGNOSIS — I82409 Acute embolism and thrombosis of unspecified deep veins of unspecified lower extremity: Secondary | ICD-10-CM | POA: Insufficient documentation

## 2015-03-02 DIAGNOSIS — M47812 Spondylosis without myelopathy or radiculopathy, cervical region: Secondary | ICD-10-CM | POA: Diagnosis not present

## 2015-03-02 DIAGNOSIS — I639 Cerebral infarction, unspecified: Secondary | ICD-10-CM

## 2015-03-02 DIAGNOSIS — D6862 Lupus anticoagulant syndrome: Secondary | ICD-10-CM | POA: Insufficient documentation

## 2015-03-02 LAB — CBC WITH DIFFERENTIAL (CANCER CENTER ONLY)
BASO#: 0 10*3/uL (ref 0.0–0.2)
BASO%: 0.4 % (ref 0.0–2.0)
EOS%: 4.2 % (ref 0.0–7.0)
Eosinophils Absolute: 0.4 10*3/uL (ref 0.0–0.5)
HEMATOCRIT: 42.9 % (ref 38.7–49.9)
HGB: 14.5 g/dL (ref 13.0–17.1)
LYMPH#: 2.3 10*3/uL (ref 0.9–3.3)
LYMPH%: 23.5 % (ref 14.0–48.0)
MCH: 30 pg (ref 28.0–33.4)
MCHC: 33.8 g/dL (ref 32.0–35.9)
MCV: 89 fL (ref 82–98)
MONO#: 1.5 10*3/uL — AB (ref 0.1–0.9)
MONO%: 15.6 % — ABNORMAL HIGH (ref 0.0–13.0)
NEUT#: 5.4 10*3/uL (ref 1.5–6.5)
NEUT%: 56.3 % (ref 40.0–80.0)
Platelets: 134 10*3/uL — ABNORMAL LOW (ref 145–400)
RBC: 4.84 10*6/uL (ref 4.20–5.70)
RDW: 14.9 % (ref 11.1–15.7)
WBC: 9.6 10*3/uL (ref 4.0–10.0)

## 2015-03-02 LAB — COMPREHENSIVE METABOLIC PANEL
ALT: 38 U/L (ref 9–46)
AST: 33 U/L (ref 10–35)
Albumin: 4.4 g/dL (ref 3.6–5.1)
Alkaline Phosphatase: 66 U/L (ref 40–115)
BUN: 26 mg/dL — ABNORMAL HIGH (ref 7–25)
CALCIUM: 9.1 mg/dL (ref 8.6–10.3)
CO2: 22 mmol/L (ref 20–31)
Chloride: 108 mmol/L (ref 98–110)
Creatinine, Ser: 0.99 mg/dL (ref 0.70–1.25)
GLUCOSE: 98 mg/dL (ref 65–99)
POTASSIUM: 4.3 mmol/L (ref 3.5–5.3)
Sodium: 142 mmol/L (ref 135–146)
Total Bilirubin: 0.5 mg/dL (ref 0.2–1.2)
Total Protein: 7.1 g/dL (ref 6.1–8.1)

## 2015-03-02 LAB — PROTIME-INR (CHCC SATELLITE)
INR: 3.9 — AB (ref 2.0–3.5)
Protime: 46.8 Seconds — ABNORMAL HIGH (ref 10.6–13.4)

## 2015-03-02 MED ORDER — TRAMADOL HCL 50 MG PO TABS: ORAL_TABLET | ORAL | Status: DC

## 2015-03-02 NOTE — Progress Notes (Signed)
Hematology and Oncology Follow Up Visit  Mark [name] [id number] Jan 03, 1946 69 y.o. 03/02/2015   Principle Diagnosis:  1. Refractory immune thrombocytopenia. 2. 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 3. Positive lupus anticoagulant.  Current Therapy:   1. Coumadin to maintain INR between     2. Decadron - the patient to take at his discretion.     Interim History:  Mr.  [name] is back for followup. He seems to have plateaued with his recovery. He still has the weakness along the left side. Apparently, there's been some issues with his left leg. He seems to be turning this and ward. When this happens, he seems to fall. Thankfully, he has not had any bleeding with falling.  He does see a neurologist. I'm not sure when he goes back to see her.  He is doing well on Coumadin. He takes Decadron when his plate count gets down below 100,000. Decadron always gets his plate count back up.  His appetite has improved good.  His daughter, who is a Therapist, art in the world of music will be coming in for Christmas. She and her family live in [place].  He's had no problems with his blood sugars.  He's had no problems with his back.  Overall, his performance status is ECOG 2-3.   Medications:  Current outpatient prescriptions:  .  acetaminophen (TYLENOL) 500 MG tablet, Take 1,000 mg by mouth every 8 (eight) hours as needed., Disp: , Rfl:  .  alendronate (FOSAMAX) 70 MG tablet, Take 70 mg by mouth every 7 (seven) days. Take with a full glass of water on an empty stomach. , Disp: , Rfl:  .  amLODipine (NORVASC) 2.5 MG tablet, , Disp: , Rfl:  .  bethanechol (URECHOLINE) 25 MG tablet, Take 25 mg by mouth 2 (two) times daily. , Disp: , Rfl:  .  buPROPion (WELLBUTRIN XL) 150 MG 24 hr tablet, , Disp: , Rfl:  .  Cholecalciferol (VITAMIN D) 2000 UNITS tablet, Take 2,000 Units by mouth 2 (two) times daily. 4,000 units, Disp: , Rfl:  .  CRESTOR 20 MG tablet, Take by mouth  daily. , Disp: , Rfl:  .  dexamethasone (DECADRON) 4 MG tablet, PRN ONLY  TAKES 40 MG DAILY FOR 4 DAYS THEN STOP, Disp: 120 tablet, Rfl: 1 .  diphenhydrAMINE (BENADRYL) 25 MG tablet, Take 25 mg by mouth every 6 (six) hours as needed (may take 1 to 2 tabs). , Disp: , Rfl:  .  famotidine (PEPCID) 40 MG tablet, Take 40 mg by mouth 2 (two) times daily as needed. , Disp: , Rfl:  .  fluticasone (FLONASE) 50 MCG/ACT nasal spray, Place 1 spray into both nostrils as needed. , Disp: , Rfl:  .  levETIRAcetam (KEPPRA) 500 MG tablet, 500 mg. 3 tabs  in am and 3 tabs in pm, Disp: , Rfl:  .  Multiple Vitamins-Minerals (MULTIVITAMIN WITH MINERALS) tablet, Take 1 tablet by mouth daily. , Disp: , Rfl:  .  NYSTATIN, TOPICAL, POWD, Apply 1 application topically 2 (two) times daily., Disp: 1 Bottle, Rfl: 4 .  pyridOXINE (VITAMIN B-6) 100 MG tablet, Take 100 mg by mouth 2 (two) times daily., Disp: , Rfl:  .  sertraline (ZOLOFT) 100 MG tablet, Take 100 mg by mouth 2 (two) times daily. 1 tab in AM and 1/2 tab in PM=150 mg daily, Disp: , Rfl:  .  Tamsulosin HCl (FLOMAX) 0.4 MG CAPS, Take 0.8 mg by mouth daily. 2 CAP = 0.8 MG,  Disp: , Rfl:  .  traMADol (ULTRAM) 50 MG tablet, Take 1-2 pills, if needed, every 6 hours for pain, Disp: 120 tablet, Rfl: 2 .  Vitamin D, Cholecalciferol, 400 UNITS CAPS, Take 4,000 Int'l Units by mouth., Disp: , Rfl:  .  VITAMIN K PO, Take 100 mcg by mouth daily., Disp: , Rfl:  .  warfarin (COUMADIN) 2 MG tablet, TAKE 4 MG(2 TABLETS TOTAL)BY MOUTH ON THE FIRST DAY, THEN 2 MG(1 TABLET) ON THE SECOND DAY,REPEAT THIS PATTERN UNTIL DOCTOR CHANGES, Disp: 90 tablet, Rfl: 0 .  rosuvastatin (CRESTOR) 20 MG tablet, Take 1 tablet (20 mg total) by mouth daily., Disp: 30 tablet, Rfl: 6  Allergies: No Known Allergies  Past Medical History, Surgical history, Social history, and Family History were reviewed and updated.  Review of Systems:  as above   Physical Exam:  weight is 199 lb 8 oz (90.493 kg). His  oral temperature is 98 F (36.7 C). His blood pressure is 150/77 and his pulse is 72. His respiration is 20.    Well-developed well-nourished white gentleman. Head and neck exam shows no ocular or oral reason. There is no adenopathy in the neck. Lungs are clear. Cardiac exam regular rate and rhythm with no murmurs, rubs or bruits. His abdomen is soft. There is no. fluid wave. There is no probable liver or spleen tip. Extremities shows no clubbing cyanosis or edema. He has chronic weakness in his left side. Skin exam shows no rashes, ecchymosis or petechia. Neurological exam shows a weakness on his left-sided, more so in the left arm then left leg.  Lab Results  Component Value Date   WBC 9.6 03/02/2015   HGB 14.5 03/02/2015   HCT 42.9 03/02/2015   MCV 89 03/02/2015   PLT 134* 03/02/2015     Chemistry      Component Value Date/Time   NA 142 12/23/2014 1125   NA 140 10/20/2014 1505   K 4.3 12/23/2014 1125   K 4.3 10/20/2014 1505   CL 106 12/23/2014 1125   CL 105 10/20/2014 1505   CO2 26 12/23/2014 1125   CO2 23 10/20/2014 1505   BUN 22 12/23/2014 1125   BUN 23 10/20/2014 1505   CREATININE 1.0 12/23/2014 1125   CREATININE 1.44* 10/20/2014 1505      Component Value Date/Time   CALCIUM 9.5 12/23/2014 1125   CALCIUM 9.1 10/20/2014 1505   ALKPHOS 64 12/23/2014 1125   ALKPHOS 62 10/20/2014 1505   AST 46* 12/23/2014 1125   AST 33 10/20/2014 1505   ALT 51* 12/23/2014 1125   ALT 53 10/20/2014 1505   BILITOT 0.70 12/23/2014 1125   BILITOT 0.6 10/20/2014 1505         Impression and Plan: Mark [name] is  69 year old gentleman with refractory immune thrombocytopenia. He also has a history of a thrombotic CVA. He's had residual left-sided weakness. This probably was back in October of 2012.    It seems as if his recovery has sort of hit a plateau. I'm not sure as to why he is having the problems with the left leg and foot. Hopefully, the neurologist will be able to help him out. I  would think that possibly an MRI of the brain might be indicated to see was going on.  His plan to count is down a little bit. It is still okay. However, I want to make sure that we check his blood counts in 2 weeks and not 3 weeks..  I would like  to see him back in about 5- 6 weeks.    Volanda Napoleon, MD 11/17/20165:48 PM

## 2015-03-03 ENCOUNTER — Ambulatory Visit: Payer: Medicare Other | Admitting: Physical Therapy

## 2015-03-06 ENCOUNTER — Ambulatory Visit: Payer: Medicare Other | Admitting: Physical Therapy

## 2015-03-07 ENCOUNTER — Encounter: Payer: Self-pay | Admitting: Physical Therapy

## 2015-03-07 ENCOUNTER — Ambulatory Visit: Payer: Commercial Managed Care - HMO | Admitting: Physical Therapy

## 2015-03-07 DIAGNOSIS — R2689 Other abnormalities of gait and mobility: Secondary | ICD-10-CM

## 2015-03-07 DIAGNOSIS — R269 Unspecified abnormalities of gait and mobility: Secondary | ICD-10-CM

## 2015-03-07 DIAGNOSIS — IMO0002 Reserved for concepts with insufficient information to code with codable children: Secondary | ICD-10-CM

## 2015-03-07 DIAGNOSIS — R2681 Unsteadiness on feet: Secondary | ICD-10-CM

## 2015-03-07 DIAGNOSIS — Z4789 Encounter for other orthopedic aftercare: Secondary | ICD-10-CM

## 2015-03-07 NOTE — Therapy (Signed)
[facility] [address] [place], [place], [address] Phone: [phone]   Fax:  [phone]  Physical Therapy Treatment  Patient Details  Name: Mark [name] MRN: [id number] Date of Birth: [birth year] Referring Provider: Theone Murdoch, MD  Encounter Date: 03/07/2015      PT End of Session - 03/07/15 1445    Visit Number 7   Number of Visits 17   Date for PT Re-Evaluation 04/08/15   Authorization Type Medicare G-Code & progress report every 10 visits   PT Start Time 1403   PT Stop Time 1445   PT Time Calculation (min) 42 min   Equipment Utilized During Treatment Gait belt   Activity Tolerance Patient tolerated treatment well   Behavior During Therapy [facility] for tasks assessed/performed      History reviewed. No pertinent past medical history.  History reviewed. No pertinent past surgical history.  There were no vitals filed for this visit.  Visit Diagnosis:  Abnormality of gait  Unsteadiness  Weakness due to cerebrovascular accident  Encounter for training in use of orthotic device  Balance problems      Subjective Assessment - 03/07/15 1410    Subjective (p) He got AFO this morning. He got new shoes with velcro closure and he can donne shoes himself.    Currently in Pain? (p) No/denies     Orthotic Training with Ottobock Reaction AFO that patient received earlier today: He also had velcro closure shoes. PT instructed pt in doffing AFO without assistance, patient return demo 2 reps with verbal cues; PT instructed in donning AFO with verbal & tactile cues on technique. Patient was able to donne AFO without assistance except T-strap not tight enough to be effective. PT recommended orthotist add D-ring to facilitate independent donning of T-strap with adequate tightness. PT also recommended adding pre-tibial pads to decrease pressure on tibial crest. PT also recommended full length socks to protect skin.  Wife reports MD recommended compression socks. PT instructed how can facilitate improved wound healing. Pt & wife verbalized understanding.  Patient ambulated 150' X 3 with AFO only with supervision with improved left LE clearance.   Neuromuscular Re-education:  Stepping to right & left with pelvic motion & hip external rotation using colored circles for placement of feet with verbal & tactile cues. See pt education.                             PT Education - 03/07/15 1400    Education provided Yes   Education Details proper donning & doffing AFO, direction change / stepping to sides at sink   Person(s) Educated Patient;Spouse   Methods Explanation;Demonstration;Tactile cues;Verbal cues;Other (comment)  wife recorded pt performing on phone   Comprehension Verbalized understanding;Returned demonstration;Verbal cues required;Tactile cues required          PT Short Term Goals - 03/07/15 1445    PT SHORT TERM GOAL #1   Title Pt will decrease TUG-COG score from 20.09 to <17.5 seconds to indicate a decreased fall risk as a community dwelling older adult. (Target Date: 03/09/2015)   Time 1   Period Months   Status On-going   PT SHORT TERM GOAL #2   Title Patient and wife verbalize understanding of AFO recommendation and rationale.  (Target Date: 03/09/2015)   Baseline MET 03/07/2015 Pt acquired Ottobock Reaction AFO   Time 1   Period Months   Status Achieved   PT SHORT  TERM GOAL #3   Title Pt will demonstrate understanding of initial HEP.    Baseline MET 03/07/2015   Time 1   Period Months   Status Achieved   PT SHORT TERM GOAL #4   Title Pt will perform gait pattern with increased L foot clearance in swing phase to decrease possibility of falling.    Baseline MET 03/07/2015 with use of AFO   Status Achieved           PT Long Term Goals - 02/07/15 1525    PT LONG TERM GOAL #1   Title Patient and wife demonstrate and verbalize understanding of ongoing  fitness plan.  (Target Date: 04/08/2015)   Time 2   Period Months   Status New   PT LONG TERM GOAL #2   Title Patient ambulates 500' on outside surfaces including grass modified independent with AFO only.  (Target Date: 04/08/2015)   Time 2   Period Months   Status New   PT LONG TERM GOAL #3   Title Patient negotiates ramps, curbs and stairs (1 rail) with AFO only modified independent.  (Target Date: 04/08/2015)   Time 2   Period Months   Status New   PT LONG TERM GOAL #4   Title Berg Balance >/= 53/56 to decrease fall risk  (Target Date: 04/08/2015)   Time 2   Period Months   Status New   PT LONG TERM GOAL #5   Title Cognitive Timed Up & Go increases <50% from standard TUG with AFO only.  (Target Date: 04/08/2015)   Time 2   Period Months   Status New               Plan - 03/07/15 1445    Clinical Impression Statement Patient met 3 of 4 STGs. Remaining STG unknown as TUG-Cognitive not retested due to time constraint. Patient improved ability to donne AFO and adding a D-ring closure to T-strap should allow independent donning.    Pt will benefit from skilled therapeutic intervention in order to improve on the following deficits Abnormal gait;Decreased activity tolerance;Decreased balance;Decreased coordination;Decreased endurance;Decreased mobility;Decreased range of motion;Decreased strength;Postural dysfunction;Pain   Rehab Potential Good   PT Frequency 2x / week   PT Duration 8 weeks   PT Treatment/Interventions ADLs/Self Care Home Management;DME Instruction;Gait training;Stair training;Functional mobility training;Therapeutic activities;Therapeutic exercise;Balance training;Neuromuscular re-education;Patient/family education;Orthotic Fit/Training;Manual techniques;Passive range of motion;Vestibular   PT Next Visit Plan Assess Cognitive TUG. Continue toward LTGs. Gait with AFO including scanning & negotiating over / around obstacles.    Consulted and Agree with Plan of  Care Patient;Family member/caregiver   Family Member Consulted wife, [name]        Problem List Patient Active Problem List   Diagnosis Date Noted  . Deep vein thrombosis (DVT) ([place]) 03/02/2015  . Lupus anticoagulant disorder ([place]) 03/02/2015  . Ischemic stroke ([place]) 10/04/2014  . 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 Deep vein thrombosis ([place]) 10/04/2014  . 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 LA (lupus anticoagulant) disorder ([place]) 10/04/2014  . Arteriosclerosis of coronary artery 09/10/2012  . Apnea, sleep 09/10/2012  . 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 Dermatophytic onychia 01/29/2012  . Spastic hemiplegia ([place]) 12/03/2011  . Hemiparesis, left ([place]) 10/31/2011  . Dysphonia 08/13/2011  . 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 ITP (idiopathic thrombocytopenic purpura) 08/12/2011    Jamey Reas PT, DPT 03/07/2015, 11:20 PM  [facility] [address] [place], [place], [address] Phone: [phone]  Fax:  [phone]  Name: RAYSHAUN NEEDLE MRN: [id number] Date of Birth: [birth year]

## 2015-03-11 ENCOUNTER — Other Ambulatory Visit: Payer: Self-pay | Admitting: Nurse Practitioner

## 2015-03-14 ENCOUNTER — Encounter: Payer: Self-pay | Admitting: Physical Therapy

## 2015-03-14 ENCOUNTER — Ambulatory Visit: Payer: Commercial Managed Care - HMO | Admitting: Physical Therapy

## 2015-03-14 DIAGNOSIS — R2689 Other abnormalities of gait and mobility: Secondary | ICD-10-CM

## 2015-03-14 DIAGNOSIS — IMO0002 Reserved for concepts with insufficient information to code with codable children: Secondary | ICD-10-CM

## 2015-03-14 DIAGNOSIS — R2681 Unsteadiness on feet: Secondary | ICD-10-CM

## 2015-03-14 DIAGNOSIS — Z4789 Encounter for other orthopedic aftercare: Secondary | ICD-10-CM

## 2015-03-14 DIAGNOSIS — R269 Unspecified abnormalities of gait and mobility: Secondary | ICD-10-CM

## 2015-03-15 NOTE — Therapy (Signed)
[facility] [address] [place], [place], [address] Phone: [phone]   Fax:  [phone]  Physical Therapy Treatment  Patient Details  Name: Mark [name] MRN: [id number] Date of Birth: 05/25/1945 Referring Provider: Cheryl Bushnell, MD  Encounter Date: 03/14/2015      PT End of Session - 03/14/15 1400    Visit Number 8   Number of Visits 17   Date for PT Re-Evaluation 04/08/15   Authorization Type Medicare G-Code & progress report every 10 visits   PT Start Time 1315   PT Stop Time 1357   PT Time Calculation (min) 42 min   Equipment Utilized During Treatment Gait belt   Activity Tolerance Patient tolerated treatment well   Behavior During Therapy [facility] for tasks assessed/performed      History reviewed. No pertinent past medical history.  History reviewed. No pertinent past surgical history.  There were no vitals filed for this visit.  Visit Diagnosis:  Abnormality of gait  Unsteadiness  Weakness due to cerebrovascular accident  Encounter for training in use of orthotic device  Balance problems      Subjective Assessment - 03/14/15 1347    Subjective No falls. He has not been able to get AFO on limb as he couldn't remember. His wife broke her foot and she's orthopedist on 03/15/2015.    Currently in Pain? No/denies                         [facility] Adult PT Treatment/Exercise - 03/14/15 1315    Ambulation/Gait   Ambulation/Gait Yes   Ambulation/Gait Assistance 5: Supervision   Ambulation/Gait Assistance Details verbal cues on step length and balance reactions with direction changes   Ambulation Distance (Feet) 1000 Feet   Assistive device None;Other (Comment)  AFO   Ambulation Surface Indoor;Level   Stairs Yes   Stairs Assistance 5: Supervision   Stairs Assistance Details (indicate cue type and reason) cues on technique if rail is on right side with RUE non-functional   Stair  Management Technique One rail Right;One rail Left  AFO   Number of Stairs 4  2 reps   Ramp 5: Supervision  AFO & no device   Ramp Details (indicate cue type and reason) verbal cues on step length & wt shift   Curb 5: Supervision  AFO & no device   Curb Details (indicate cue type and reason) verbal cues on step length & technique   Standardized Balance Assessment   Standardized Balance Assessment Timed Up and Go Test   Timed Up and Go Test   TUG Cognitive TUG   Cognitive TUG (seconds) 17.42  with AFO & no device   High Level Balance   High Level Balance Activities Turns;Head turns;Figure 8 turns;Negotitating around obstacles;Negotiating over obstacles   High Level Balance Comments verbal, tactile & visual (demo) cues on technique with AFO                PT Education - 03/14/15 1400    Education provided Yes   Education Details donning AFO   Person(s) Educated Patient   Methods Explanation;Tactile cues;Verbal cues;Other (comment)  PT videotaped with pt's phone while he donned AFO with PT cueing   Comprehension Verbalized understanding;Verbal cues required;Tactile cues required;Need further instruction          PT Short Term Goals - 03/14/15 1400    PT SHORT TERM GOAL #1   Title Pt will decrease TUG-COG   score from 20.09 to <17.5 seconds to indicate a decreased fall risk as a community dwelling older adult. (Target Date: 03/09/2015)   Baseline MET TUG-COG 17.42sec with AFO   Time 1   Period Months   Status Achieved   PT SHORT TERM GOAL #2   Title Patient and wife verbalize understanding of AFO recommendation and rationale.  (Target Date: 03/09/2015)   Baseline MET 03/07/2015 Pt acquired Ottobock Reaction AFO   Time 1   Period Months   Status Achieved   PT SHORT TERM GOAL #3   Title Pt will demonstrate understanding of initial HEP.    Baseline MET 03/07/2015   Time 1   Period Months   Status Achieved   PT SHORT TERM GOAL #4   Title Pt will perform gait pattern  with increased L foot clearance in swing phase to decrease possibility of falling.    Baseline MET 03/07/2015 with use of AFO   Status Achieved           PT Long Term Goals - 02/07/15 1525    PT LONG TERM GOAL #1   Title Patient and wife demonstrate and verbalize understanding of ongoing fitness plan.  (Target Date: 04/08/2015)   Time 2   Period Months   Status New   PT LONG TERM GOAL #2   Title Patient ambulates 500' on outside surfaces including grass modified independent with AFO only.  (Target Date: 04/08/2015)   Time 2   Period Months   Status New   PT LONG TERM GOAL #3   Title Patient negotiates ramps, curbs and stairs (1 rail) with AFO only modified independent.  (Target Date: 04/08/2015)   Time 2   Period Months   Status New   PT LONG TERM GOAL #4   Title Berg Balance >/= 53/56 to decrease fall risk  (Target Date: 04/08/2015)   Time 2   Period Months   Status New   PT LONG TERM GOAL #5   Title Cognitive Timed Up & Go increases <50% from standard TUG with AFO only.  (Target Date: 04/08/2015)   Time 2   Period Months   Status New               Plan - 03/14/15 1400    Clinical Impression Statement Patient improved gait with AFO including negotiating around and over obstacles with instruction. Patient is scheduled to see orthotist tomorrow to have T-strap modified to enable more independent donning. Recording donning of AFO should improve carryover to home.    Pt will benefit from skilled therapeutic intervention in order to improve on the following deficits Abnormal gait;Decreased activity tolerance;Decreased balance;Decreased coordination;Decreased endurance;Decreased mobility;Decreased range of motion;Decreased strength;Postural dysfunction;Pain   Rehab Potential Good   PT Frequency 2x / week   PT Duration 8 weeks   PT Treatment/Interventions ADLs/Self Care Home Management;DME Instruction;Gait training;Stair training;Functional mobility training;Therapeutic  activities;Therapeutic exercise;Balance training;Neuromuscular re-education;Patient/family education;Orthotic Fit/Training;Manual techniques;Passive range of motion;Vestibular   PT Next Visit Plan Continue toward LTGs. Gait with AFO including scanning & negotiating over / around obstacles.    Consulted and Agree with Plan of Care Patient;Family member/caregiver   Family Member Consulted wife, Lorraine        Problem List Patient Active Problem List   Diagnosis Date Noted  . Deep vein thrombosis (DVT) ([facility]) 03/02/2015  . Lupus anticoagulant disorder ([facility]) 03/02/2015  . Ischemic stroke ([facility]) 10/04/2014  . 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 Deep vein thrombosis ([facility]) 10/04/2014  . Immune   thrombocytopenic purpura ([facility]) 10/04/2014  . LA (lupus anticoagulant) disorder ([facility]) 10/04/2014  . Arteriosclerosis of coronary artery 09/10/2012  . Apnea, sleep 09/10/2012  . 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 Dermatophytic onychia 01/29/2012  . Spastic hemiplegia ([facility]) 12/03/2011  . Hemiparesis, left ([facility]) 10/31/2011  . Dysphonia 08/13/2011  . 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 ITP (idiopathic thrombocytopenic purpura) 08/12/2011    Posie Lillibridge PT, DPT 03/15/2015, 6:56 AM  [facility] [address] [place], [place], [address] Phone: [phone]   Fax:  [phone]  Name: Mark [name] MRN: [id number] Date of Birth: 04/10/1946     

## 2015-03-16 ENCOUNTER — Ambulatory Visit: Payer: Commercial Managed Care - HMO | Attending: Neurology | Admitting: Physical Therapy

## 2015-03-16 ENCOUNTER — Encounter: Payer: Self-pay | Admitting: Physical Therapy

## 2015-03-16 DIAGNOSIS — I69898 Other sequelae of other cerebrovascular disease: Secondary | ICD-10-CM | POA: Insufficient documentation

## 2015-03-16 DIAGNOSIS — R29818 Other symptoms and signs involving the nervous system: Secondary | ICD-10-CM | POA: Insufficient documentation

## 2015-03-16 DIAGNOSIS — R2689 Other abnormalities of gait and mobility: Secondary | ICD-10-CM

## 2015-03-16 DIAGNOSIS — Z4789 Encounter for other orthopedic aftercare: Secondary | ICD-10-CM

## 2015-03-16 DIAGNOSIS — R531 Weakness: Secondary | ICD-10-CM | POA: Diagnosis present

## 2015-03-16 DIAGNOSIS — R2681 Unsteadiness on feet: Secondary | ICD-10-CM | POA: Insufficient documentation

## 2015-03-16 DIAGNOSIS — Z5189 Encounter for other specified aftercare: Secondary | ICD-10-CM | POA: Insufficient documentation

## 2015-03-16 DIAGNOSIS — R269 Unspecified abnormalities of gait and mobility: Secondary | ICD-10-CM | POA: Insufficient documentation

## 2015-03-16 DIAGNOSIS — IMO0002 Reserved for concepts with insufficient information to code with codable children: Secondary | ICD-10-CM

## 2015-03-16 NOTE — Therapy (Signed)
[facility] [address] [place], [place], [address] Phone: [phone]   Fax:  [phone]  Physical Therapy Treatment  Patient Details  Name: Mark [name] MRN: [id number] Date of Birth: Jul 11, 1945 Referring Provider: Theone Murdoch, MD  Encounter Date: 03/16/2015      PT End of Session - 03/16/15 1431    Visit Number 9   Number of Visits 17   Date for PT Re-Evaluation 04/08/15   Authorization Type Medicare G-Code & progress report every 10 visits   PT Start Time 7494   PT Stop Time 1431   PT Time Calculation (min) 38 min   Equipment Utilized During Treatment Gait belt   Activity Tolerance Patient tolerated treatment well   Behavior During Therapy [facility] for tasks assessed/performed      History reviewed. No pertinent past medical history.  History reviewed. No pertinent past surgical history.  There were no vitals filed for this visit.  Visit Diagnosis:  Abnormality of gait  Unsteadiness  Weakness due to cerebrovascular accident  Encounter for training in use of orthotic device  Balance problems      Subjective Assessment - 03/16/15 1403    Subjective No falls. Orthotist added D-ring to T-strap to control supination and pads to protect tibia crest. It feels better now & he can put AFO on himself. Doctor found 2nd fx in wife's foot and she will need surgery.    Currently in Pain? No/denies                         [facility] Adult PT Treatment/Exercise - 03/16/15 1353    Ambulation/Gait   Ambulation/Gait Yes   Ambulation/Gait Assistance 5: Supervision   Ambulation Distance (Feet) 1200 Feet   Assistive device None;Other (Comment)  AFO   Gait Pattern Step-through pattern;Decreased stance time - left;Trunk flexed   Ambulation Surface Indoor;Level;Outdoor;Unlevel;Paved;Gravel;Grass   Stairs Yes   Stairs Assistance 5: Supervision   Stair Management Technique One rail Right;One rail Left   AFO   Number of Stairs 4  2 reps   Ramp 5: Supervision  AFO & no device   Curb 5: Supervision  AFO & no device   Standardized Balance Assessment   Standardized Balance Assessment --   Timed Up and Go Test   TUG --   Cognitive TUG (seconds) --   High Level Balance   High Level Balance Activities Turns;Head turns;Figure 8 turns;Negotitating around obstacles;Negotiating over obstacles;Direction changes;Sudden stops   High Level Balance Comments verbal, tactile & visual (demo) cues on technique with AFO   Self-Care   Self-Care ADL's   ADL's Patient donnes AFO including T-strap modified (increased time) independent. PT trimmed end of strap to ease feeding into D-ring.                   PT Short Term Goals - 03/14/15 1400    PT SHORT TERM GOAL #1   Title Pt will decrease TUG-COG score from 20.09 to <17.5 seconds to indicate a decreased fall risk as a community dwelling older adult. (Target Date: 03/09/2015)   Baseline MET TUG-COG 17.42sec with AFO   Time 1   Period Months   Status Achieved   PT SHORT TERM GOAL #2   Title Patient and wife verbalize understanding of AFO recommendation and rationale.  (Target Date: 03/09/2015)   Baseline MET 03/07/2015 Pt acquired Ottobock Reaction AFO   Time 1   Period Months   Status  Achieved   PT SHORT TERM GOAL #3   Title Pt will demonstrate understanding of initial HEP.    Baseline MET 03/07/2015   Time 1   Period Months   Status Achieved   PT SHORT TERM GOAL #4   Title Pt will perform gait pattern with increased L foot clearance in swing phase to decrease possibility of falling.    Baseline MET 03/07/2015 with use of AFO   Status Achieved           PT Long Term Goals - 02/07/15 1525    PT LONG TERM GOAL #1   Title Patient and wife demonstrate and verbalize understanding of ongoing fitness plan.  (Target Date: 04/08/2015)   Time 2   Period Months   Status New   PT LONG TERM GOAL #2   Title Patient ambulates 500' on  outside surfaces including grass modified independent with AFO only.  (Target Date: 04/08/2015)   Time 2   Period Months   Status New   PT LONG TERM GOAL #3   Title Patient negotiates ramps, curbs and stairs (1 rail) with AFO only modified independent.  (Target Date: 04/08/2015)   Time 2   Period Months   Status New   PT LONG TERM GOAL #4   Title Berg Balance >/= 53/56 to decrease fall risk  (Target Date: 04/08/2015)   Time 2   Period Months   Status New   PT LONG TERM GOAL #5   Title Cognitive Timed Up & Go increases <50% from standard TUG with AFO only.  (Target Date: 04/08/2015)   Time 2   Period Months   Status New               Plan - 03/16/15 1431    Clinical Impression Statement Patient improved independence with donning AFO with addition of D-ring on T-strap and pre-tibial pads improved pressure on anterior tibia. Patient's gait with AFO was improved with no balance losses. Patient may be ready for discharge at next PT appt. PT to assess LTGs.    Pt will benefit from skilled therapeutic intervention in order to improve on the following deficits Abnormal gait;Decreased activity tolerance;Decreased balance;Decreased coordination;Decreased endurance;Decreased mobility;Decreased range of motion;Decreased strength;Postural dysfunction;Pain   Rehab Potential Good   PT Frequency 2x / week   PT Duration 8 weeks   PT Treatment/Interventions ADLs/Self Care Home Management;DME Instruction;Gait training;Stair training;Functional mobility training;Therapeutic activities;Therapeutic exercise;Balance training;Neuromuscular re-education;Patient/family education;Orthotic Fit/Training;Manual techniques;Passive range of motion;Vestibular   PT Next Visit Plan Assess LTGs.    Consulted and Agree with Plan of Care Patient        Problem List Patient Active Problem List   Diagnosis Date Noted  . Deep vein thrombosis (DVT) ([place]) 03/02/2015  . Lupus anticoagulant disorder ([place])  03/02/2015  . Ischemic stroke ([place]) 10/04/2014  . 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 Deep vein thrombosis ([place]) 10/04/2014  . 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 LA (lupus anticoagulant) disorder ([place]) 10/04/2014  . Arteriosclerosis of coronary artery 09/10/2012  . Apnea, sleep 09/10/2012  . 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 Dermatophytic onychia 01/29/2012  . Spastic hemiplegia ([place]) 12/03/2011  . Hemiparesis, left ([place]) 10/31/2011  . Dysphonia 08/13/2011  . 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 ITP (idiopathic thrombocytopenic purpura) 08/12/2011    Jamey Reas PT, DPT 03/16/2015, 9:51 PM  [facility] [address] [place], [place], [address] Phone: [phone]   Fax:  [phone]  Name: Aldric Wenzler  Kem MRN: [id number] Date of Birth: 24-Sep-1945

## 2015-03-17 ENCOUNTER — Other Ambulatory Visit: Payer: Medicare Other

## 2015-03-21 ENCOUNTER — Ambulatory Visit: Payer: Commercial Managed Care - HMO | Admitting: Physical Therapy

## 2015-03-23 ENCOUNTER — Ambulatory Visit: Payer: Medicare Other | Admitting: Physical Therapy

## 2015-03-27 ENCOUNTER — Other Ambulatory Visit: Payer: Self-pay | Admitting: Hematology & Oncology

## 2015-03-28 ENCOUNTER — Ambulatory Visit: Payer: Commercial Managed Care - HMO

## 2015-03-28 DIAGNOSIS — Z4789 Encounter for other orthopedic aftercare: Secondary | ICD-10-CM

## 2015-03-28 DIAGNOSIS — R2689 Other abnormalities of gait and mobility: Secondary | ICD-10-CM

## 2015-03-28 DIAGNOSIS — R2681 Unsteadiness on feet: Secondary | ICD-10-CM

## 2015-03-28 DIAGNOSIS — R269 Unspecified abnormalities of gait and mobility: Secondary | ICD-10-CM

## 2015-03-28 DIAGNOSIS — IMO0002 Reserved for concepts with insufficient information to code with codable children: Secondary | ICD-10-CM

## 2015-03-28 NOTE — Therapy (Signed)
[facility] [address] [place], [place], [address] Phone: [phone]   Fax:  [phone]  Physical Therapy Treatment & Discharge Summary  Patient Details  Name: Mark [name] MRN: [id number] Date of Birth: [birth year] Referring Provider: Theone Murdoch, MD  Encounter Date: 03/28/2015      PT End of Session - 03/28/15 1727    Visit Number 10   Number of Visits 17   Date for PT Re-Evaluation 04/08/15   Authorization Type Medicare G-Code & progress report every 10 visits   PT Start Time 1403   PT Stop Time 1445   PT Time Calculation (min) 42 min   Activity Tolerance Patient tolerated treatment well   Behavior During Therapy [facility] for tasks assessed/performed      No past medical history on file.  No past surgical history on file.  There were no vitals filed for this visit.  Visit Diagnosis:  Abnormality of gait  Unsteadiness  Weakness due to cerebrovascular accident  Encounter for training in use of orthotic device  Balance problems      Subjective Assessment - 03/28/15 1411    Subjective Walked 2.2 miles.  Haven't fallen since wearing the brace.  Wear it most of the time.  when not wearing have a shin guard to protect from hitting sore.  Plan to contact MD regarding wound care referral.   Currently in Pain? Yes   Pain Score 3    Pain Location Leg   Pain Orientation Left;Anterior   Pain Descriptors / Indicators Discomfort   Pain Onset 1 to 4 weeks ago   Pain Frequency Intermittent   Aggravating Factors  hitting it, changing dressing   Pain Relieving Factors nothing                         [facility] Adult PT Treatment/Exercise - 03/28/15 0001    Ambulation/Gait   Ambulation/Gait Yes   Ambulation/Gait Assistance 7: Independent   Ambulation Distance (Feet) 1200 Feet   Assistive device None;Other (Comment)  AFO   Gait Pattern Step-through pattern;Decreased stance time - left;Trunk  flexed   Ambulation Surface Level;Indoor;Outdoor;Paved;Grass;Unlevel   Stairs Yes   Stairs Assistance 5: Supervision   Stairs Assistance Details (indicate cue type and reason) cues for technqiue for descent with AFO   Stair Management Technique One rail Right;One rail Left;Forwards   Number of Stairs 4   Height of Stairs 6   Ramp 7: Independent  AFO no device   Curb 7: Independent  AFO, no device   Standardized Balance Assessment   Standardized Balance Assessment Timed Up and Go Test;Berg Balance Test   Berg Balance Test   Sit to Stand Able to stand without using hands and stabilize independently   Standing Unsupported Able to stand safely 2 minutes   Sitting with Back Unsupported but Feet Supported on Floor or Stool Able to sit safely and securely 2 minutes   Stand to Sit Sits safely with minimal use of hands   Transfers Able to transfer safely, minor use of hands   Standing Unsupported with Eyes Closed Able to stand 10 seconds safely   Standing Ubsupported with Feet Together Able to place feet together independently and stand 1 minute safely   From Standing, Reach Forward with Outstretched Arm Can reach confidently >25 cm (10")   From Standing Position, Pick up Object from Floor Able to pick up shoe safely and easily   From Standing Position, Turn  to Look Behind Over each Shoulder Looks behind from both sides and weight shifts well   Turn 360 Degrees Able to turn 360 degrees safely one side only in 4 seconds or less   Standing Unsupported, Alternately Place Feet on Step/Stool Able to stand independently and safely and complete 8 steps in 20 seconds   Standing Unsupported, One Foot in Front Able to plae foot ahead of the other independently and hold 30 seconds   Standing on One Leg Able to lift leg independently and hold > 10 seconds   Total Score 54   Timed Up and Go Test   TUG Cognitive TUG   Cognitive TUG (seconds) 16.24  with AFO and no device                PT  Education - 03/28/15 1727    Education provided Yes   Education Details reviewed fitness plan   Person(s) Educated Patient   Methods Explanation   Comprehension Verbalized understanding     Discussed fitness plans as wife not with broken ankle.  Has friend he walks with every Saturday for 2.2 miles, going twice a week to [employer] at [facility].  Will have to get someone else to drive him while wife not able to drive.  Plans to change insurance at first of year and will qualify for Silver Sneakers so plans to join local [employer] with his wife when she is able.      PT Short Term Goals - 03/14/15 1400    PT SHORT TERM GOAL #1   Title Pt will decrease TUG-COG score from 20.09 to <17.5 seconds to indicate a decreased fall risk as a community dwelling older adult. (Target Date: 03/09/2015)   Baseline MET TUG-COG 17.42sec with AFO   Time 1   Period Months   Status Achieved   PT SHORT TERM GOAL #2   Title Patient and wife verbalize understanding of AFO recommendation and rationale.  (Target Date: 03/09/2015)   Baseline MET 03/07/2015 Pt acquired [place] Reaction AFO   Time 1   Period Months   Status Achieved   PT SHORT TERM GOAL #3   Title Pt will demonstrate understanding of initial HEP.    Baseline MET 03/07/2015   Time 1   Period Months   Status Achieved   PT SHORT TERM GOAL #4   Title Pt will perform gait pattern with increased L foot clearance in swing phase to decrease possibility of falling.    Baseline MET 03/07/2015 with use of AFO   Status Achieved           PT Long Term Goals - 03/28/15 1731    PT LONG TERM GOAL #1   Title Patient and wife demonstrate and verbalize understanding of ongoing fitness plan.  (Target Date: 04/08/2015)   Time 2   Period Months   Status Achieved   PT LONG TERM GOAL #2   Title Patient ambulates 500' on outside surfaces including grass modified independent with AFO only.  (Target Date: 04/08/2015)   Time 2   Period Months    Status Achieved   PT LONG TERM GOAL #3   Title Patient negotiates ramps, curbs and stairs (1 rail) with AFO only modified independent.  (Target Date: 04/08/2015)   Time 2   Period Months   Status Achieved   PT LONG TERM GOAL #4   Title Berg Balance >/= 53/56 to decrease fall risk  (Target Date: 04/08/2015)   Time 2  Period Months   Status Achieved   PT LONG TERM GOAL #5   Title Cognitive Timed Up & Go increases <50% from standard TUG with AFO only.  (Target Date: 04/08/2015)   Time 2   Period Months   Status Partially Met               Plan - 04-21-15 1728    Clinical Impression Statement Patient has achieved LTG's and is ready for d/c today.  Reports feeling good about fitness plan and was able to negotiate outdoor surfaces including grass, pavement and curbs even negotiating geese droppings independently.     Pt will benefit from skilled therapeutic intervention in order to improve on the following deficits Abnormal gait;Decreased activity tolerance;Decreased balance;Decreased coordination;Decreased endurance;Decreased mobility;Decreased range of motion;Decreased strength;Postural dysfunction;Pain   Rehab Potential Good   PT Frequency 2x / week   PT Duration 8 weeks   PT Treatment/Interventions ADLs/Self Care Home Management;DME Instruction;Gait training;Stair training;Functional mobility training;Therapeutic activities;Therapeutic exercise;Balance training;Neuromuscular re-education;Patient/family education;Orthotic Fit/Training;Manual techniques;Passive range of motion;Vestibular   PT Next Visit Plan d/c today   Consulted and Agree with Plan of Care Patient          G-Codes - 21-Apr-2015 1734    Functional Assessment Tool Used Timed Up & Go 10.43sec; Cognitive TUG 16.24sec which is 60% increase.   Functional Limitation Mobility: Walking and moving around   Mobility: Walking and Moving Around Goal Status [phone]) At least 1 percent but less than 20 percent impaired, limited  or restricted   Mobility: Walking and Moving Around Discharge Status [phone]) At least 1 percent but less than 20 percent impaired, limited or restricted      Problem List Patient Active Problem List   Diagnosis Date Noted  . Deep vein thrombosis (DVT) ([place]) 03/02/2015  . Lupus anticoagulant disorder ([place]) 03/02/2015  . Ischemic stroke ([place]) 10/04/2014  . 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 Deep vein thrombosis ([place]) 10/04/2014  . 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 LA (lupus anticoagulant) disorder ([place]) 10/04/2014  . Arteriosclerosis of coronary artery 09/10/2012  . Apnea, sleep 09/10/2012  . 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 Dermatophytic onychia 01/29/2012  . Spastic hemiplegia ([place]) 12/03/2011  . Hemiparesis, left ([place]) 10/31/2011  . Dysphonia 08/13/2011  . 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 ITP (idiopathic thrombocytopenic purpura) 08/12/2011   PHYSICAL THERAPY DISCHARGE SUMMARY  Visits from Start of Care: 10  Current functional level related to goals / functional outcomes: Patient achieved all LTG's except TUG cognitive is 60% greater than standard, but improved from 90% more.  Patient able to demonstrate outdoor ambulation over grassy unlevel terrain independently with AFO, is donning AFO appropriately and has acceptable fitness plan in place.     Remaining deficits: R ankle weakness will continue fitness strengthening on his own   Education / Equipment: Education in ongoing fitness, in care of AFO and skin, in HEP. Plan: Patient agrees to discharge.  Patient goals were partially met. Patient is being discharged due to meeting the stated rehab goals.  ?????       Reginia Naas 2015/04/21, 5:43 PM  Magda Kiel, PT 04/21/2015  [facility] [address] [place], [place], [address] Phone: [phone]   Fax:  [phone]  Name: Mark [name] MRN:  [id number] Date of Birth: [birth year]

## 2015-03-31 ENCOUNTER — Ambulatory Visit: Payer: Commercial Managed Care - HMO | Admitting: Physical Therapy

## 2015-04-03 ENCOUNTER — Telehealth: Payer: Self-pay | Admitting: *Deleted

## 2015-04-03 NOTE — Telephone Encounter (Signed)
Last pm patient's ankle had some swelling. After elevating it through the night and this morning, the swelling is gone. He has no pain and no warmth to the site. With a history of DVT he wanted to make sure that further workup wasn't needed. Reviewed with Dr Marin Olp. No need to for further assessment. Patient will call back if symptoms persist or worsen.

## 2015-04-04 ENCOUNTER — Ambulatory Visit: Payer: Commercial Managed Care - HMO | Admitting: Physical Therapy

## 2015-04-06 ENCOUNTER — Ambulatory Visit: Payer: Medicare Other | Admitting: Physical Therapy

## 2015-04-27 ENCOUNTER — Other Ambulatory Visit: Payer: Self-pay | Admitting: Hematology & Oncology

## 2015-04-28 ENCOUNTER — Ambulatory Visit (HOSPITAL_BASED_OUTPATIENT_CLINIC_OR_DEPARTMENT_OTHER): Payer: Medicare Other | Admitting: Family

## 2015-04-28 ENCOUNTER — Encounter: Payer: Self-pay | Admitting: Family

## 2015-04-28 ENCOUNTER — Other Ambulatory Visit (HOSPITAL_BASED_OUTPATIENT_CLINIC_OR_DEPARTMENT_OTHER): Payer: Medicare Other

## 2015-04-28 VITALS — BP 113/73 | HR 75 | Temp 98.2°F | Resp 16 | Ht 69.0 in | Wt 196.0 lb

## 2015-04-28 DIAGNOSIS — Z8673 Personal history of transient ischemic attack (TIA), and cerebral infarction without residual deficits: Secondary | ICD-10-CM

## 2015-04-28 DIAGNOSIS — I639 Cerebral infarction, unspecified: Secondary | ICD-10-CM

## 2015-04-28 DIAGNOSIS — D6862 Lupus anticoagulant syndrome: Secondary | ICD-10-CM

## 2015-04-28 DIAGNOSIS — D693 Immune thrombocytopenic purpura: Secondary | ICD-10-CM

## 2015-04-28 DIAGNOSIS — M47812 Spondylosis without myelopathy or radiculopathy, cervical region: Secondary | ICD-10-CM

## 2015-04-28 LAB — CBC WITH DIFFERENTIAL (CANCER CENTER ONLY)
BASO#: 0.1 10*3/uL (ref 0.0–0.2)
BASO%: 0.4 % (ref 0.0–2.0)
EOS ABS: 0.4 10*3/uL (ref 0.0–0.5)
EOS%: 3.7 % (ref 0.0–7.0)
HEMATOCRIT: 43.2 % (ref 38.7–49.9)
HGB: 14.7 g/dL (ref 13.0–17.1)
LYMPH#: 3.2 10*3/uL (ref 0.9–3.3)
LYMPH%: 27.4 % (ref 14.0–48.0)
MCH: 29.5 pg (ref 28.0–33.4)
MCHC: 34 g/dL (ref 32.0–35.9)
MCV: 87 fL (ref 82–98)
MONO#: 2.4 10*3/uL — AB (ref 0.1–0.9)
MONO%: 20.5 % — ABNORMAL HIGH (ref 0.0–13.0)
NEUT#: 5.7 10*3/uL (ref 1.5–6.5)
NEUT%: 48 % (ref 40.0–80.0)
Platelets: 155 10*3/uL (ref 145–400)
RBC: 4.98 10*6/uL (ref 4.20–5.70)
RDW: 14.8 % (ref 11.1–15.7)
WBC: 11.8 10*3/uL — ABNORMAL HIGH (ref 4.0–10.0)

## 2015-04-28 LAB — PROTIME-INR (CHCC SATELLITE)
INR: 3.9 — ABNORMAL HIGH (ref 2.0–3.5)
Protime: 46.8 Seconds — ABNORMAL HIGH (ref 10.6–13.4)

## 2015-04-28 NOTE — Progress Notes (Signed)
Hematology and Oncology Follow Up Visit  Mark [name] [id number] [birth year] 70 y.o. 04/28/2015   Principle Diagnosis:  1. Refractory immune thrombocytopenia. 2. Positive lupus anticoagulant. 3. 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 Coumadin to maintain INR between  2. Decadron - the patient to take at his discretion    Interim History:  Mark [name] is here today for a follow-up. He is doing fairly well. He is still having trouble with his left leg. He feels that his leg muscles are tightening up. He is still in PT and using a leg brace for support. He had a fall on his back deck but thankfully was not seriously injured. His Left leg does not always move the way he wants it too and he loses his balance.  No swelling in his extremities. He has some joint  His INR today is 3.9. His platelet count is holding nicely at 155.  He has had a little blood on his tissue when blowing his nose but this resolves quickly. No other episdoes of bleeding. He has had no bruising or petechiae.  He has a non healing wound on his left shin from where he has bumped his leg and did not feel it despite taking antibiotics. There is no discharge or odor. He would like a referral to wound care. We can certainly do this today.   No fever, chills, n/v, cough, dizziness, chest pain, palpitations, abdominal pain, changes in bowel or bladder habits. No lymphadenopathy found on exam.  He continues to have a good appetite and is staying well hydrated. His weight is stable at this time.   Medications:    Medication List       This list is accurate as of: 04/28/15  2:16 PM.  Always use your most recent med list.               alendronate 70 MG tablet  Commonly known as:  FOSAMAX  Take 70 mg by mouth every 7 (seven) days. Take with a full glass of water on an empty stomach.     amLODipine 2.5 MG tablet  Commonly known as:  NORVASC     BENADRYL 25 MG tablet    Generic drug:  diphenhydrAMINE  Take 25 mg by mouth every 6 (six) hours as needed (may take 1 to 2 tabs).     bethanechol 25 MG tablet  Commonly known as:  URECHOLINE  Take 25 mg by mouth 2 (two) times daily.     buPROPion 150 MG 24 hr tablet  Commonly known as:  WELLBUTRIN XL     dexamethasone 4 MG tablet  Commonly known as:  DECADRON  PRN ONLY  TAKES 40 MG DAILY FOR 4 DAYS THEN STOP     famotidine 40 MG tablet  Commonly known as:  PEPCID  Take 40 mg by mouth 2 (two) times daily as needed.     fluticasone 50 MCG/ACT nasal spray  Commonly known as:  FLONASE  Place 1 spray into both nostrils as needed.     levETIRAcetam 500 MG tablet  Commonly known as:  KEPPRA  500 mg. 3 tabs  in am and 3 tabs in pm     multivitamin with minerals tablet  Take 1 tablet by mouth daily.     NYSTATIN (TOPICAL) Powd  Apply 1 application topically 2 (two) times daily.     pyridOXINE 100 MG tablet  Commonly known as:  VITAMIN B-6  Take  100 mg by mouth 2 (two) times daily.     rosuvastatin 20 MG tablet  Commonly known as:  CRESTOR  Take 1 tablet (20 mg total) by mouth daily.     CRESTOR 20 MG tablet  Generic drug:  rosuvastatin  Take by mouth daily.     sertraline 100 MG tablet  Commonly known as:  ZOLOFT  Take 100 mg by mouth 2 (two) times daily. 1 tab in AM and 1/2 tab in PM=150 mg daily     tamsulosin 0.4 MG Caps capsule  Commonly known as:  FLOMAX  Take 0.8 mg by mouth daily. 2 CAP = 0.8 MG     traMADol 50 MG tablet  Commonly known as:  ULTRAM  TAKE ONE TABLET BY MOUTH EVERY 6 HOURS AS NEEDED FOR PAIN     TYLENOL 500 MG tablet  Generic drug:  acetaminophen  Take 1,000 mg by mouth every 8 (eight) hours as needed.     Vitamin D 2000 units tablet  Take 2,000 Units by mouth 2 (two) times daily. 4,000 units     Vitamin D (Cholecalciferol) 400 units Caps  Take 4,000 Int'l Units by mouth.     VITAMIN K PO  Take 100 mcg by mouth daily.     warfarin 2 MG tablet  Commonly  known as:  COUMADIN  TAKE 4 MG(2 TABLETS TOTAL)BY MOUTH ON THE FIRST DAY, THEN 2 MG(1 TABLET) ON THE SECOND DAY,REPEAT THIS PATTERN UNTIL DOCTOR CHANGES     warfarin 2 MG tablet  Commonly known as:  COUMADIN  TAKE 2 TABLETS BY  MOUTH ON THE FIRST DAY, THEN TAKE ONE TABLET THE SECOND DAY.  REPEAT THIS PATTERN UNTIL DOCTOR CHANGES        Allergies: No Known Allergies  Past Medical History, Surgical history, Social history, and Family History were reviewed and updated.  Review of Systems: All other 10 point review of systems is negative.   Physical Exam:  vitals were not taken for this visit.  Wt Readings from Last 3 Encounters:  03/02/15 199 lb 8 oz (90.493 kg)  12/23/14 195 lb (88.451 kg)  10/20/14 196 lb (88.905 kg)    Ocular: Sclerae unicteric, pupils equal, round and reactive to light Ear-nose-throat: Oropharynx clear, dentition fair Lymphatic: No cervical supraclavicular or axillary adenopathy Lungs no rales or rhonchi, good excursion bilaterally Heart regular rate and rhythm, no murmur appreciated Abd soft, nontender, positive bowel sounds, no liver or spleen tip palpated on exam MSK no focal spinal tenderness, no joint edema Neuro: non-focal, well-oriented, appropriate affect Breasts: Deferred  Lab Results  Component Value Date   WBC 9.6 03/02/2015   HGB 14.5 03/02/2015   HCT 42.9 03/02/2015   MCV 89 03/02/2015   PLT 134* 03/02/2015   Lab Results  Component Value Date   FERRITIN 62 08/20/2010   Lab Results  Component Value Date   RETICCTPCT 1.5 08/20/2010   RBC 4.84 03/02/2015   RETICCTABS 75.3 08/20/2010   No results found for: KPAFRELGTCHN, LAMBDASER, KAPLAMBRATIO No results found for: IGGSERUM, IGA, IGMSERUM No results found for: Kathrynn Ducking, MSPIKE, SPEI   Chemistry      Component Value Date/Time   NA 142 03/02/2015 1520   NA 142 12/23/2014 1125   K 4.3 03/02/2015 1520   K 4.3 12/23/2014 1125   CL  108 03/02/2015 1520   CL 106 12/23/2014 1125   CO2 22 03/02/2015 1520   CO2 26 12/23/2014 1125  BUN 26* 03/02/2015 1520   BUN 22 12/23/2014 1125   CREATININE 0.99 03/02/2015 1520   CREATININE 1.0 12/23/2014 1125      Component Value Date/Time   CALCIUM 9.1 03/02/2015 1520   CALCIUM 9.5 12/23/2014 1125   ALKPHOS 66 03/02/2015 1520   ALKPHOS 64 12/23/2014 1125   AST 33 03/02/2015 1520   AST 46* 12/23/2014 1125   ALT 38 03/02/2015 1520   ALT 51* 12/23/2014 1125   BILITOT 0.5 03/02/2015 1520   BILITOT 0.70 12/23/2014 1125     Impression and Plan: Mr. Tainter is 70 year old gentleman with refractory immune thrombocytopenia. 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  He is still having problems with his left leg and wears a brace to help with support.  His INR is unchanged at 3.9 today so we will continue on his current dose of Coumadin. His PTT is holding nicely at 155.  We will continue checking his labs every 2 weeks and then plan to see him back for a follow-up in 6 weeks.  Both he and his wife know to contact us with any other questions or concerns. We can certainly see him sooner if need be.   Eliezer Bottom, NP 1/13/20172:16 PM

## 2015-05-11 ENCOUNTER — Other Ambulatory Visit: Payer: Self-pay | Admitting: *Deleted

## 2015-05-11 DIAGNOSIS — D693 Immune thrombocytopenic purpura: Secondary | ICD-10-CM

## 2015-05-12 ENCOUNTER — Other Ambulatory Visit (HOSPITAL_BASED_OUTPATIENT_CLINIC_OR_DEPARTMENT_OTHER): Payer: Medicare Other

## 2015-05-12 ENCOUNTER — Telehealth: Payer: Self-pay | Admitting: Oncology

## 2015-05-12 DIAGNOSIS — D693 Immune thrombocytopenic purpura: Secondary | ICD-10-CM

## 2015-05-12 DIAGNOSIS — D6862 Lupus anticoagulant syndrome: Secondary | ICD-10-CM | POA: Diagnosis not present

## 2015-05-12 DIAGNOSIS — Z8673 Personal history of transient ischemic attack (TIA), and cerebral infarction without residual deficits: Secondary | ICD-10-CM | POA: Diagnosis not present

## 2015-05-12 LAB — PROTIME-INR (CHCC SATELLITE)
INR: 3.2 (ref 2.0–3.5)
PROTIME: 38.4 s — AB (ref 10.6–13.4)

## 2015-05-12 NOTE — Telephone Encounter (Addendum)
-----   Message from Volanda Napoleon, MD sent at 05/12/2015  4:11 PM EST ----- Call - INR is perfect!!  Laurey Arrow Spoke with patient's wife.  Verbalized understanding.

## 2015-05-26 ENCOUNTER — Telehealth: Payer: Self-pay | Admitting: Hematology & Oncology

## 2015-05-26 ENCOUNTER — Other Ambulatory Visit: Payer: Medicare Other

## 2015-05-26 NOTE — Telephone Encounter (Signed)
Patient called stating that the person who was going to transport him to our facility was late. Rescheduled patient's lab for Wednesday 05/31/2015.       [facility].

## 2015-05-31 ENCOUNTER — Other Ambulatory Visit (HOSPITAL_BASED_OUTPATIENT_CLINIC_OR_DEPARTMENT_OTHER): Payer: Medicare Other

## 2015-05-31 DIAGNOSIS — D6862 Lupus anticoagulant syndrome: Secondary | ICD-10-CM

## 2015-05-31 DIAGNOSIS — D693 Immune thrombocytopenic purpura: Secondary | ICD-10-CM

## 2015-05-31 LAB — CBC WITH DIFFERENTIAL (CANCER CENTER ONLY)
BASO#: 0 10*3/uL (ref 0.0–0.2)
BASO%: 0.3 % (ref 0.0–2.0)
EOS%: 4.1 % (ref 0.0–7.0)
Eosinophils Absolute: 0.4 10*3/uL (ref 0.0–0.5)
HCT: 44.5 % (ref 38.7–49.9)
HGB: 14.8 g/dL (ref 13.0–17.1)
LYMPH#: 2.5 10*3/uL (ref 0.9–3.3)
LYMPH%: 26.9 % (ref 14.0–48.0)
MCH: 29.1 pg (ref 28.0–33.4)
MCHC: 33.3 g/dL (ref 32.0–35.9)
MCV: 88 fL (ref 82–98)
MONO#: 1.9 10*3/uL — ABNORMAL HIGH (ref 0.1–0.9)
MONO%: 20.5 % — ABNORMAL HIGH (ref 0.0–13.0)
NEUT#: 4.5 10*3/uL (ref 1.5–6.5)
NEUT%: 48.2 % (ref 40.0–80.0)
PLATELETS: 174 10*3/uL (ref 145–400)
RBC: 5.08 10*6/uL (ref 4.20–5.70)
RDW: 14.5 % (ref 11.1–15.7)
WBC: 9.3 10*3/uL (ref 4.0–10.0)

## 2015-05-31 LAB — COMPREHENSIVE METABOLIC PANEL (CC13)
A/G RATIO: 1.5 (ref 1.1–2.5)
ALT: 52 IU/L — AB (ref 0–44)
AST: 40 IU/L (ref 0–40)
Albumin, Serum: 4.6 g/dL (ref 3.6–4.8)
Alkaline Phosphatase, S: 87 IU/L (ref 39–117)
BILIRUBIN TOTAL: 0.3 mg/dL (ref 0.0–1.2)
BUN/Creatinine Ratio: 18 (ref 10–22)
BUN: 21 mg/dL (ref 8–27)
CHLORIDE: 105 mmol/L (ref 96–106)
Calcium, Ser: 9.4 mg/dL (ref 8.6–10.2)
Carbon Dioxide, Total: 24 mmol/L (ref 18–29)
Creatinine, Ser: 1.17 mg/dL (ref 0.76–1.27)
GFR calc non Af Amer: 63 mL/min/{1.73_m2} (ref >59)
GFR, EST AFRICAN AMERICAN: 73 mL/min/{1.73_m2} (ref >59)
Globulin, Total: 3.1 g/dL (ref 1.5–4.5)
Glucose: 77 mg/dL (ref 65–99)
POTASSIUM: 4.3 mmol/L (ref 3.5–5.2)
SODIUM: 137 mmol/L (ref 134–144)
TOTAL PROTEIN: 7.7 g/dL (ref 6.0–8.5)

## 2015-05-31 LAB — PROTIME-INR (CHCC SATELLITE)
INR: 2.5 (ref 2.0–3.5)
Protime: 30 Seconds — ABNORMAL HIGH (ref 10.6–13.4)

## 2015-06-12 ENCOUNTER — Ambulatory Visit: Payer: Medicare Other | Admitting: Family

## 2015-06-12 ENCOUNTER — Other Ambulatory Visit: Payer: Medicare Other

## 2015-06-14 ENCOUNTER — Other Ambulatory Visit: Payer: Self-pay | Admitting: *Deleted

## 2015-06-14 DIAGNOSIS — D693 Immune thrombocytopenic purpura: Secondary | ICD-10-CM

## 2015-06-15 ENCOUNTER — Other Ambulatory Visit: Payer: Medicare Other

## 2015-06-15 ENCOUNTER — Ambulatory Visit: Payer: Self-pay | Admitting: Family

## 2015-06-22 ENCOUNTER — Telehealth: Payer: Self-pay | Admitting: Hematology & Oncology

## 2015-06-22 NOTE — Telephone Encounter (Signed)
Mark [id number] Requesting Provider: Dr. Harlan Stains Treating Provider: Dr. Burney Gauze Visits: 6 Status: Approved Dates: 06/22/2015 - 12/19/2015        COPY SCANNED

## 2015-06-23 ENCOUNTER — Ambulatory Visit (HOSPITAL_BASED_OUTPATIENT_CLINIC_OR_DEPARTMENT_OTHER): Payer: Medicare Other | Admitting: Family

## 2015-06-23 ENCOUNTER — Other Ambulatory Visit (HOSPITAL_BASED_OUTPATIENT_CLINIC_OR_DEPARTMENT_OTHER): Payer: Medicare Other

## 2015-06-23 ENCOUNTER — Encounter: Payer: Self-pay | Admitting: Family

## 2015-06-23 VITALS — BP 125/61 | HR 67 | Temp 99.5°F | Resp 18 | Ht 69.0 in | Wt 199.0 lb

## 2015-06-23 DIAGNOSIS — D6862 Lupus anticoagulant syndrome: Secondary | ICD-10-CM

## 2015-06-23 DIAGNOSIS — Z8673 Personal history of transient ischemic attack (TIA), and cerebral infarction without residual deficits: Secondary | ICD-10-CM | POA: Diagnosis not present

## 2015-06-23 DIAGNOSIS — D693 Immune thrombocytopenic purpura: Secondary | ICD-10-CM

## 2015-06-23 LAB — COMPREHENSIVE METABOLIC PANEL
ALT: 82 U/L — AB (ref 0–55)
ANION GAP: 10 meq/L (ref 3–11)
AST: 25 U/L (ref 5–34)
Albumin: 4.2 g/dL (ref 3.5–5.0)
Alkaline Phosphatase: 67 U/L (ref 40–150)
BILIRUBIN TOTAL: 0.52 mg/dL (ref 0.20–1.20)
BUN: 40 mg/dL — ABNORMAL HIGH (ref 7.0–26.0)
CHLORIDE: 108 meq/L (ref 98–109)
CO2: 19 meq/L — AB (ref 22–29)
CREATININE: 1.3 mg/dL (ref 0.7–1.3)
Calcium: 9 mg/dL (ref 8.4–10.4)
EGFR: 56 mL/min/{1.73_m2} — ABNORMAL LOW (ref >90)
GLUCOSE: 167 mg/dL — AB (ref 70–140)
Potassium: 4.3 mEq/L (ref 3.5–5.1)
SODIUM: 138 meq/L (ref 136–145)
TOTAL PROTEIN: 7.8 g/dL (ref 6.4–8.3)

## 2015-06-23 LAB — TECHNOLOGIST REVIEW CHCC SATELLITE

## 2015-06-23 LAB — CBC WITH DIFFERENTIAL (CANCER CENTER ONLY)
BASO#: 0 10*3/uL (ref 0.0–0.2)
BASO%: 0 % (ref 0.0–2.0)
EOS%: 0 % (ref 0.0–7.0)
Eosinophils Absolute: 0 10*3/uL (ref 0.0–0.5)
HEMATOCRIT: 43.1 % (ref 38.7–49.9)
HEMOGLOBIN: 14.8 g/dL (ref 13.0–17.1)
LYMPH#: 1.6 10*3/uL (ref 0.9–3.3)
LYMPH%: 7.8 % — ABNORMAL LOW (ref 14.0–48.0)
MCH: 29.7 pg (ref 28.0–33.4)
MCHC: 34.3 g/dL (ref 32.0–35.9)
MCV: 87 fL (ref 82–98)
MONO#: 2.4 10*3/uL — AB (ref 0.1–0.9)
MONO%: 12 % (ref 0.0–13.0)
NEUT%: 80.2 % — AB (ref 40.0–80.0)
NEUTROS ABS: 16.1 10*3/uL — AB (ref 1.5–6.5)
Platelets: 237 10*3/uL (ref 145–400)
RBC: 4.98 10*6/uL (ref 4.20–5.70)
RDW: 15.1 % (ref 11.1–15.7)
WBC: 20.1 10*3/uL — AB (ref 4.0–10.0)

## 2015-06-23 LAB — PROTIME-INR (CHCC SATELLITE)
INR: 4.2 — AB (ref 2.0–3.5)
Protime: 50.4 Seconds — ABNORMAL HIGH (ref 10.6–13.4)

## 2015-06-23 NOTE — Progress Notes (Signed)
Hematology and Oncology Follow Up Visit  Mark [name] [id number] April 10, 1946 70 y.o. 06/23/2015   Principle Diagnosis:  1. Refractory immune thrombocytopenia. 2. Positive lupus anticoagulant. 3. 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 Coumadin to maintain INR between  2. Decadron - the patient to take at his discretion    Interim History:  Mark [name] is here today for a follow-up. He had some bruising and has taken Decadron for the last 4 days. His platelet count at this time is 237. His INR is up at 4.2. He has missed his vitamin K for several days.  We will have him hold his Coumadin for the next several days and resume on Tuesday evening. He is having some dental work done on Monday. No teeth will be pulled. They are hoping to save the tooth.  No episodes of bleeding.  No fever, chills, n/v, cough, rash, dizziness, SOB, chest pain, palpitations, abdominal pain or changes in bowel or bladder habits. No lymphadenopathy found on exam.  He continues to have a good appetite and is staying well hydrated. His weight is stable at this time.  He is starting to play cords on his guitar and plans to have a song ready for his birthday next week.  Medications:    Medication List       This list is accurate as of: 06/23/15  2:46 PM.  Always use your most recent med list.               alendronate 70 MG tablet  Commonly known as:  FOSAMAX  Take 70 mg by mouth every 7 (seven) days. Take with a full glass of water on an empty stomach.     amLODipine 2.5 MG tablet  Commonly known as:  NORVASC     BENADRYL 25 MG tablet  Generic drug:  diphenhydrAMINE  Take 25 mg by mouth every 6 (six) hours as needed (may take 1 to 2 tabs).     bethanechol 25 MG tablet  Commonly known as:  URECHOLINE  Take 25 mg by mouth 2 (two) times daily.     buPROPion 150 MG 24 hr tablet  Commonly known as:  WELLBUTRIN XL     dexamethasone 4 MG tablet    Commonly known as:  DECADRON  PRN ONLY  TAKES 40 MG DAILY FOR 4 DAYS THEN STOP     famotidine 40 MG tablet  Commonly known as:  PEPCID  Take 40 mg by mouth daily.     fluticasone 50 MCG/ACT nasal spray  Commonly known as:  FLONASE  Place 1 spray into both nostrils as needed.     levETIRAcetam 500 MG tablet  Commonly known as:  KEPPRA  500 mg. 3 tabs  in am and 3 tabs in pm     multivitamin with minerals tablet  Take 1 tablet by mouth daily.     NYSTATIN (TOPICAL) Powd  Apply 1 application topically 2 (two) times daily.     rosuvastatin 20 MG tablet  Commonly known as:  CRESTOR  Take 1 tablet (20 mg total) by mouth daily.     CRESTOR 20 MG tablet  Generic drug:  rosuvastatin  Take by mouth daily.     sertraline 100 MG tablet  Commonly known as:  ZOLOFT  Take 100 mg by mouth 2 (two) times daily. 1 tab in AM and 1/2 tab in PM=150 mg daily     tamsulosin 0.4 MG Caps capsule  Commonly known as:  FLOMAX  Take 0.8 mg by mouth daily. 2 CAP = 0.8 MG     traMADol 50 MG tablet  Commonly known as:  ULTRAM  TAKE ONE TABLET BY MOUTH EVERY 6 HOURS AS NEEDED FOR PAIN     TYLENOL 500 MG tablet  Generic drug:  acetaminophen  Take 1,000 mg by mouth every 8 (eight) hours as needed.     Vitamin D 2000 units tablet  Take 2,000 Units by mouth 2 (two) times daily. 4,000 units     VITAMIN K PO  Take 100 mcg by mouth daily.     warfarin 2 MG tablet  Commonly known as:  COUMADIN  TAKE 4 MG(2 TABLETS TOTAL)BY MOUTH ON THE FIRST DAY, THEN 2 MG(1 TABLET) ON THE SECOND DAY,REPEAT THIS PATTERN UNTIL DOCTOR CHANGES        Allergies: No Known Allergies  Past Medical History, Surgical history, Social history, and Family History were reviewed and updated.  Review of Systems: All other 10 point review of systems is negative.   Physical Exam:  height is 5\' 9"  (1.753 m) and weight is 199 lb (90.266 kg). His oral temperature is 99.5 F (37.5 C). His blood pressure is 125/61 and his pulse  is 67. His respiration is 18.   Wt Readings from Last 3 Encounters:  06/23/15 199 lb (90.266 kg)  04/28/15 196 lb (88.905 kg)  03/02/15 199 lb 8 oz (90.493 kg)    Ocular: Sclerae unicteric, pupils equal, round and reactive to light Ear-nose-throat: Oropharynx clear, dentition fair Lymphatic: No cervical supraclavicular or axillary adenopathy Lungs no rales or rhonchi, good excursion bilaterally Heart regular rate and rhythm, no murmur appreciated Abd soft, nontender, positive bowel sounds, no liver or spleen tip palpated on exam, no fluid wave MSK no focal spinal tenderness, no joint edema Neuro: non-focal, well-oriented, appropriate affect Breasts: Deferred  Lab Results  Component Value Date   WBC 20.1* 06/23/2015   HGB 14.8 06/23/2015   HCT 43.1 06/23/2015   MCV 87 06/23/2015   PLT 237 06/23/2015   Lab Results  Component Value Date   FERRITIN 62 08/20/2010   Lab Results  Component Value Date   RETICCTPCT 1.5 08/20/2010   RBC 4.98 06/23/2015   RETICCTABS 75.3 08/20/2010   No results found for: KPAFRELGTCHN, LAMBDASER, KAPLAMBRATIO No results found for: IGGSERUM, IGA, IGMSERUM No results found for: Odetta Pink, SPEI   Chemistry      Component Value Date/Time   NA 137 05/31/2015 1411   NA 142 03/02/2015 1520   NA 142 12/23/2014 1125   K 4.3 05/31/2015 1411   K 4.3 03/02/2015 1520   K 4.3 12/23/2014 1125   CL 105 05/31/2015 1411   CL 108 03/02/2015 1520   CL 106 12/23/2014 1125   CO2 24 05/31/2015 1411   CO2 22 03/02/2015 1520   CO2 26 12/23/2014 1125   BUN 21 05/31/2015 1411   BUN 26* 03/02/2015 1520   BUN 22 12/23/2014 1125   CREATININE 1.17 05/31/2015 1411   CREATININE 0.99 03/02/2015 1520   CREATININE 1.0 12/23/2014 1125      Component Value Date/Time   CALCIUM 9.4 05/31/2015 1411   CALCIUM 9.1 03/02/2015 1520   CALCIUM 9.5 12/23/2014 1125   ALKPHOS 87 05/31/2015 1411   ALKPHOS 66 03/02/2015  1520   ALKPHOS 64 12/23/2014 1125   AST 40 05/31/2015 1411   AST 33 03/02/2015 1520   AST 46*  12/23/2014 1125   ALT 52* 05/31/2015 1411   ALT 38 03/02/2015 1520   ALT 51* 12/23/2014 1125   BILITOT 0.3 05/31/2015 1411   BILITOT 0.5 03/02/2015 1520   BILITOT 0.70 12/23/2014 1125     Impression and Plan: Mark [name] is 70 year old gentleman with refractory immune thrombocytopenia. 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  He had some bruising earlier this week and took his decadron for 4 days. His platelet count is up to 237. His INR is 4.2.  He needs to have a dental procedure done on Monday that will not involve pulling the tooth. I spoke with Dr. Marin Olp and we will have him hold his coumadin for now and then resume Tuesday evening.  We will continue checking his labs every 2 weeks and then plan to see him back for a follow-up in 2 months.  Both he and his wife know to contact us with any other questions or concerns. We can certainly see him sooner if need be.   Eliezer Bottom, NP 3/10/20172:46 PM

## 2015-07-10 ENCOUNTER — Other Ambulatory Visit (HOSPITAL_BASED_OUTPATIENT_CLINIC_OR_DEPARTMENT_OTHER): Payer: Medicare Other

## 2015-07-10 DIAGNOSIS — D6862 Lupus anticoagulant syndrome: Secondary | ICD-10-CM

## 2015-07-10 DIAGNOSIS — D693 Immune thrombocytopenic purpura: Secondary | ICD-10-CM

## 2015-07-10 LAB — CBC WITH DIFFERENTIAL (CANCER CENTER ONLY)
BASO#: 0 10*3/uL (ref 0.0–0.2)
BASO%: 0.5 % (ref 0.0–2.0)
EOS ABS: 0.6 10*3/uL — AB (ref 0.0–0.5)
EOS%: 6.8 % (ref 0.0–7.0)
HCT: 41.6 % (ref 38.7–49.9)
HEMOGLOBIN: 14.3 g/dL (ref 13.0–17.1)
LYMPH#: 2.5 10*3/uL (ref 0.9–3.3)
LYMPH%: 29.9 % (ref 14.0–48.0)
MCH: 29.8 pg (ref 28.0–33.4)
MCHC: 34.4 g/dL (ref 32.0–35.9)
MCV: 87 fL (ref 82–98)
MONO#: 1.6 10*3/uL — ABNORMAL HIGH (ref 0.1–0.9)
MONO%: 19.1 % — AB (ref 0.0–13.0)
NEUT%: 43.7 % (ref 40.0–80.0)
NEUTROS ABS: 3.7 10*3/uL (ref 1.5–6.5)
Platelets: 101 10*3/uL — ABNORMAL LOW (ref 145–400)
RBC: 4.8 10*6/uL (ref 4.20–5.70)
RDW: 15.2 % (ref 11.1–15.7)
WBC: 8.5 10*3/uL (ref 4.0–10.0)

## 2015-07-10 LAB — COMPREHENSIVE METABOLIC PANEL (CC13)
ALK PHOS: 74 IU/L (ref 39–117)
ALT: 39 IU/L (ref 0–44)
AST: 26 IU/L (ref 0–40)
Albumin, Serum: 4.2 g/dL (ref 3.5–4.8)
Albumin/Globulin Ratio: 1.5 (ref 1.2–2.2)
BUN/Creatinine Ratio: 21 (ref 10–22)
BUN: 26 mg/dL (ref 8–27)
Bilirubin Total: 0.3 mg/dL (ref 0.0–1.2)
CALCIUM: 8.9 mg/dL (ref 8.6–10.2)
CO2: 23 mmol/L (ref 18–29)
CREATININE: 1.21 mg/dL (ref 0.76–1.27)
Chloride, Ser: 106 mmol/L (ref 96–106)
GFR calc Af Amer: 70 mL/min/{1.73_m2} (ref >59)
GFR, EST NON AFRICAN AMERICAN: 60 mL/min/{1.73_m2} (ref >59)
GLUCOSE: 95 mg/dL (ref 65–99)
Globulin, Total: 2.8 g/dL (ref 1.5–4.5)
Potassium, Ser: 4.1 mmol/L (ref 3.5–5.2)
Sodium: 138 mmol/L (ref 134–144)
Total Protein: 7 g/dL (ref 6.0–8.5)

## 2015-07-10 LAB — PROTIME-INR (CHCC SATELLITE)
INR: 3.8 — ABNORMAL HIGH (ref 2.0–3.5)
PROTIME: 45.6 s — AB (ref 10.6–13.4)

## 2015-07-16 ENCOUNTER — Other Ambulatory Visit: Payer: Self-pay | Admitting: Hematology & Oncology

## 2015-07-24 ENCOUNTER — Other Ambulatory Visit (HOSPITAL_BASED_OUTPATIENT_CLINIC_OR_DEPARTMENT_OTHER): Payer: Medicare Other

## 2015-07-24 DIAGNOSIS — D693 Immune thrombocytopenic purpura: Secondary | ICD-10-CM | POA: Diagnosis not present

## 2015-07-24 LAB — COMPREHENSIVE METABOLIC PANEL (CC13)
A/G RATIO: 1.5 (ref 1.2–2.2)
ALT: 35 IU/L (ref 0–44)
AST: 22 IU/L (ref 0–40)
Albumin, Serum: 4.3 g/dL (ref 3.5–4.8)
Alkaline Phosphatase, S: 65 IU/L (ref 39–117)
BUN/Creatinine Ratio: 22 (ref 10–24)
BUN: 23 mg/dL (ref 8–27)
Bilirubin Total: 0.2 mg/dL (ref 0.0–1.2)
CALCIUM: 9.1 mg/dL (ref 8.6–10.2)
CO2: 20 mmol/L (ref 18–29)
CREATININE: 1.06 mg/dL (ref 0.76–1.27)
Chloride, Ser: 110 mmol/L — ABNORMAL HIGH (ref 96–106)
GFR, EST AFRICAN AMERICAN: 82 mL/min/{1.73_m2} (ref >59)
GFR, EST NON AFRICAN AMERICAN: 71 mL/min/{1.73_m2} (ref >59)
Globulin, Total: 2.9 g/dL (ref 1.5–4.5)
Glucose: 121 mg/dL — ABNORMAL HIGH (ref 65–99)
POTASSIUM: 4.2 mmol/L (ref 3.5–5.2)
Sodium: 141 mmol/L (ref 134–144)
TOTAL PROTEIN: 7.2 g/dL (ref 6.0–8.5)

## 2015-07-24 LAB — PROTIME-INR (CHCC SATELLITE)
INR: 3.8 — AB (ref 2.0–3.5)
Protime: 45.6 Seconds — ABNORMAL HIGH (ref 10.6–13.4)

## 2015-07-24 LAB — CBC WITH DIFFERENTIAL (CANCER CENTER ONLY)
BASO#: 0 10*3/uL (ref 0.0–0.2)
BASO%: 0.3 % (ref 0.0–2.0)
EOS ABS: 0.4 10*3/uL (ref 0.0–0.5)
EOS%: 3.2 % (ref 0.0–7.0)
HEMATOCRIT: 41.8 % (ref 38.7–49.9)
HEMOGLOBIN: 14.3 g/dL (ref 13.0–17.1)
LYMPH#: 2.9 10*3/uL (ref 0.9–3.3)
LYMPH%: 24.9 % (ref 14.0–48.0)
MCH: 30.2 pg (ref 28.0–33.4)
MCHC: 34.2 g/dL (ref 32.0–35.9)
MCV: 88 fL (ref 82–98)
MONO#: 2.7 10*3/uL — ABNORMAL HIGH (ref 0.1–0.9)
MONO%: 22.8 % — AB (ref 0.0–13.0)
NEUT%: 48.8 % (ref 40.0–80.0)
NEUTROS ABS: 5.7 10*3/uL (ref 1.5–6.5)
Platelets: 230 10*3/uL (ref 145–400)
RBC: 4.74 10*6/uL (ref 4.20–5.70)
RDW: 15.9 % — AB (ref 11.1–15.7)
WBC: 11.7 10*3/uL — AB (ref 4.0–10.0)

## 2015-08-07 ENCOUNTER — Other Ambulatory Visit: Payer: Medicare Other

## 2015-08-21 ENCOUNTER — Ambulatory Visit (HOSPITAL_BASED_OUTPATIENT_CLINIC_OR_DEPARTMENT_OTHER): Payer: Medicare Other | Admitting: Hematology & Oncology

## 2015-08-21 ENCOUNTER — Encounter: Payer: Self-pay | Admitting: Hematology & Oncology

## 2015-08-21 ENCOUNTER — Other Ambulatory Visit (HOSPITAL_BASED_OUTPATIENT_CLINIC_OR_DEPARTMENT_OTHER): Payer: Medicare Other

## 2015-08-21 VITALS — BP 132/67 | HR 74 | Temp 98.6°F | Resp 16 | Ht 69.0 in | Wt 200.0 lb

## 2015-08-21 DIAGNOSIS — D6862 Lupus anticoagulant syndrome: Secondary | ICD-10-CM | POA: Diagnosis not present

## 2015-08-21 DIAGNOSIS — Z7901 Long term (current) use of anticoagulants: Secondary | ICD-10-CM

## 2015-08-21 DIAGNOSIS — I69354 Hemiplegia and hemiparesis following cerebral infarction affecting left non-dominant side: Secondary | ICD-10-CM

## 2015-08-21 DIAGNOSIS — D693 Immune thrombocytopenic purpura: Secondary | ICD-10-CM | POA: Diagnosis not present

## 2015-08-21 DIAGNOSIS — Z8673 Personal history of transient ischemic attack (TIA), and cerebral infarction without residual deficits: Secondary | ICD-10-CM | POA: Diagnosis not present

## 2015-08-21 DIAGNOSIS — I82409 Acute embolism and thrombosis of unspecified deep veins of unspecified lower extremity: Secondary | ICD-10-CM

## 2015-08-21 LAB — CBC WITH DIFFERENTIAL (CANCER CENTER ONLY)
BASO#: 0 10*3/uL (ref 0.0–0.2)
BASO%: 0.5 % (ref 0.0–2.0)
EOS%: 4.1 % (ref 0.0–7.0)
Eosinophils Absolute: 0.4 10*3/uL (ref 0.0–0.5)
HCT: 41.1 % (ref 38.7–49.9)
HGB: 13.9 g/dL (ref 13.0–17.1)
LYMPH#: 2.5 10*3/uL (ref 0.9–3.3)
LYMPH%: 28 % (ref 14.0–48.0)
MCH: 30 pg (ref 28.0–33.4)
MCHC: 33.8 g/dL (ref 32.0–35.9)
MCV: 89 fL (ref 82–98)
MONO#: 1.6 10*3/uL — ABNORMAL HIGH (ref 0.1–0.9)
MONO%: 18.4 % — ABNORMAL HIGH (ref 0.0–13.0)
NEUT#: 4.4 10*3/uL (ref 1.5–6.5)
NEUT%: 49 % (ref 40.0–80.0)
PLATELETS: 191 10*3/uL (ref 145–400)
RBC: 4.64 10*6/uL (ref 4.20–5.70)
RDW: 15.8 % — AB (ref 11.1–15.7)
WBC: 8.9 10*3/uL (ref 4.0–10.0)

## 2015-08-21 LAB — COMPREHENSIVE METABOLIC PANEL (CC13)
ALT: 39 IU/L (ref 0–44)
AST (SGOT): 29 IU/L (ref 0–40)
Albumin, Serum: 4.2 g/dL (ref 3.5–4.8)
Albumin/Globulin Ratio: 1.4 (ref 1.2–2.2)
Alkaline Phosphatase, S: 62 IU/L (ref 39–117)
BILIRUBIN TOTAL: 0.4 mg/dL (ref 0.0–1.2)
BUN / CREAT RATIO: 23 (ref 10–24)
BUN: 26 mg/dL (ref 8–27)
CHLORIDE: 110 mmol/L — AB (ref 96–106)
CREATININE: 1.11 mg/dL (ref 0.76–1.27)
Calcium, Ser: 9.1 mg/dL (ref 8.6–10.2)
Carbon Dioxide, Total: 22 mmol/L (ref 18–29)
GFR, EST AFRICAN AMERICAN: 77 mL/min/{1.73_m2} (ref >59)
GFR, EST NON AFRICAN AMERICAN: 67 mL/min/{1.73_m2} (ref >59)
Globulin, Total: 3.1 g/dL (ref 1.5–4.5)
Glucose: 97 mg/dL (ref 65–99)
Potassium, Ser: 4.4 mmol/L (ref 3.5–5.2)
Sodium: 141 mmol/L (ref 134–144)
TOTAL PROTEIN: 7.3 g/dL (ref 6.0–8.5)

## 2015-08-21 LAB — PROTIME-INR (CHCC SATELLITE)
INR: 4.2 — AB (ref 2.0–3.5)
Protime: 50.4 Seconds — ABNORMAL HIGH (ref 10.6–13.4)

## 2015-08-21 NOTE — Progress Notes (Signed)
Hematology and Oncology Follow Up Visit  Mark [name] [id number] 02-18-1946 70 y.o. 08/21/2015   Principle Diagnosis:  1. Refractory immune thrombocytopenia. 2. 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 3. Positive lupus anticoagulant.  Current Therapy:   1. Coumadin to maintain INR between     2. Decadron - the patient to take at his discretion.     Interim History:  Mr.  [name] is back for followup. He seems to have plateaued with his recovery. He still has the weakness along the left side.   Apparently, his platelet count was down last week. I think he started a little Decadron. His platelet counts come up.  He's had some more ecchymoses. I think he may have had a hematoma on the left thigh.  His Coumadin dose probably is living on the high side. I be more worried about this than thrombocytopenia.  His appetite has improved good.  He his wife just saw the daughter in concert this past weekend. She travels the world and is incredibly well known.Marland Kitchen  He's had no problems with his blood sugars.  He's had no problems with his back.  Overall, his performance status is ECOG 2-3.   Medications:  Current outpatient prescriptions:  .  acetaminophen (TYLENOL) 500 MG tablet, Take 1,000 mg by mouth every 8 (eight) hours as needed., Disp: , Rfl:  .  alendronate (FOSAMAX) 70 MG tablet, Take 70 mg by mouth every 7 (seven) days. Take with a full glass of water on an empty stomach. , Disp: , Rfl:  .  amLODipine (NORVASC) 2.5 MG tablet, , Disp: , Rfl:  .  bethanechol (URECHOLINE) 25 MG tablet, Take 25 mg by mouth 2 (two) times daily. , Disp: , Rfl:  .  buPROPion (WELLBUTRIN XL) 150 MG 24 hr tablet, , Disp: , Rfl:  .  Cholecalciferol (VITAMIN D) 2000 UNITS tablet, Take 2,000 Units by mouth 2 (two) times daily. 4,000 units, Disp: , Rfl:  .  CRESTOR 20 MG tablet, Take by mouth daily. , Disp: , Rfl:  .  dexamethasone (DECADRON) 4 MG tablet, PRN ONLY  TAKES 40 MG DAILY  FOR 4 DAYS THEN STOP, Disp: 120 tablet, Rfl: 1 .  diphenhydrAMINE (BENADRYL) 25 MG tablet, Take 25 mg by mouth every 6 (six) hours as needed (may take 1 to 2 tabs). , Disp: , Rfl:  .  famotidine (PEPCID) 40 MG tablet, Take 40 mg by mouth daily. , Disp: , Rfl:  .  fluticasone (FLONASE) 50 MCG/ACT nasal spray, Place 1 spray into both nostrils as needed. , Disp: , Rfl:  .  levETIRAcetam (KEPPRA) 500 MG tablet, 500 mg. 3 tabs  in am and 3 tabs in pm, Disp: , Rfl:  .  Multiple Vitamins-Minerals (MULTIVITAMIN WITH MINERALS) tablet, Take 1 tablet by mouth daily. , Disp: , Rfl:  .  NYSTATIN, TOPICAL, POWD, Apply 1 application topically 2 (two) times daily., Disp: 1 Bottle, Rfl: 4 .  sertraline (ZOLOFT) 100 MG tablet, Take 100 mg by mouth 2 (two) times daily. 1 tab in AM and 1/2 tab in PM=150 mg daily, Disp: , Rfl:  .  Tamsulosin HCl (FLOMAX) 0.4 MG CAPS, Take 0.8 mg by mouth daily. 2 CAP = 0.8 MG, Disp: , Rfl:  .  traMADol (ULTRAM) 50 MG tablet, TAKE ONE TABLET BY MOUTH EVERY 6 HOURS AS NEEDED FOR PAIN, Disp: 60 tablet, Rfl: 0 .  VITAMIN K PO, Take 100 mcg by mouth daily., Disp: , Rfl:  .  warfarin (COUMADIN) 2 MG tablet, TAKE 4 MG(2 TABLETS TOTAL)BY MOUTH ON THE FIRST DAY, THEN 2 MG(1 TABLET) ON THE SECOND DAY,REPEAT THIS PATTERN UNTIL DOCTOR CHANGES, Disp: 90 tablet, Rfl: 0 .  warfarin (COUMADIN) 2 MG tablet, TAKE 2 TABLETS BY MOUTH ON THE FIRST DAY, THEN TAKE 1 TABLET THE SECOND DAY, REPEAT THIS PATTERN UNTIL DOCTOR CHANGES, Disp: 90 tablet, Rfl: 0 .  rosuvastatin (CRESTOR) 20 MG tablet, Take 1 tablet (20 mg total) by mouth daily., Disp: 30 tablet, Rfl: 6  Allergies: No Known Allergies  Past Medical History, Surgical history, Social history, and Family History were reviewed and updated.  Review of Systems:  as above   Physical Exam:  height is 5\' 9"  (1.753 m) and weight is 200 lb (90.719 kg). His oral temperature is 98.6 F (37 C). His blood pressure is 132/67 and his pulse is 74. His respiration  is 16.    Well-developed well-nourished white gentleman. Head and neck exam shows no ocular or oral reason. There is no adenopathy in the neck. Lungs are clear. Cardiac exam regular rate and rhythm with no murmurs, rubs or bruits. His abdomen is soft. There is no. fluid wave. There is no probable liver or spleen tip. Extremities shows no clubbing cyanosis or edema. He has chronic weakness in his left side. Skin exam shows no rashes, ecchymosis or petechia. Neurological exam shows a weakness on his left-sided, more so in the left arm then left leg.  Lab Results  Component Value Date   WBC 8.9 08/21/2015   HGB 13.9 08/21/2015   HCT 41.1 08/21/2015   MCV 89 08/21/2015   PLT 191 08/21/2015     Chemistry      Component Value Date/Time   NA 141 07/24/2015 1416   NA 138 06/23/2015 1349   NA 142 03/02/2015 1520   NA 142 12/23/2014 1125   K 4.2 07/24/2015 1416   K 4.3 06/23/2015 1349   K 4.3 03/02/2015 1520   K 4.3 12/23/2014 1125   CL 110* 07/24/2015 1416   CL 108 03/02/2015 1520   CL 106 12/23/2014 1125   CO2 20 07/24/2015 1416   CO2 19* 06/23/2015 1349   CO2 22 03/02/2015 1520   CO2 26 12/23/2014 1125   BUN 23 07/24/2015 1416   BUN 40.0* 06/23/2015 1349   BUN 26* 03/02/2015 1520   BUN 22 12/23/2014 1125   CREATININE 1.06 07/24/2015 1416   CREATININE 1.3 06/23/2015 1349   CREATININE 0.99 03/02/2015 1520   CREATININE 1.0 12/23/2014 1125      Component Value Date/Time   CALCIUM 9.1 07/24/2015 1416   CALCIUM 9.0 06/23/2015 1349   CALCIUM 9.1 03/02/2015 1520   CALCIUM 9.5 12/23/2014 1125   ALKPHOS 65 07/24/2015 1416   ALKPHOS 67 06/23/2015 1349   ALKPHOS 66 03/02/2015 1520   ALKPHOS 64 12/23/2014 1125   AST 22 07/24/2015 1416   AST 25 06/23/2015 1349   AST 33 03/02/2015 1520   AST 46* 12/23/2014 1125   ALT 35 07/24/2015 1416   ALT 82* 06/23/2015 1349   ALT 38 03/02/2015 1520   ALT 51* 12/23/2014 1125   BILITOT 0.2 07/24/2015 1416   BILITOT 0.52 06/23/2015 1349   BILITOT  0.5 03/02/2015 1520   BILITOT 0.70 12/23/2014 1125         Impression and Plan: Mark [name] is 70 year old gentleman with refractory immune thrombocytopenia. He also has a history of a thrombotic CVA. He's had residual left-sided weakness. This probably  was back in October of 2012.   I think would probably have to cut his Coumadin dose pack. He is on 2 mg alternating with 4 mg. I think I want him to go back to 2 mg a day. His INR is 4.2. This might be causing some of the issues with his bruising.  Because we are changing his Coumadin dose, I will need to get him back in a week so we can monitor his INR.  I would like to consider changing to one of the NOAC's worry with him falling we may not be able to stop any bleeding if it happens.  I will also order an MRI of the brain. He's been having more headaches. I would not think that there is any issue with bleeding but given his history with the CVA, I just think it would help [place] to get a better picture of his brain.   I spent about 30 minutes with he and his wife.   I will plan to see them back in probably 3 weeks.  Volanda Napoleon, MD 5/8/20175:14 PM

## 2015-08-23 ENCOUNTER — Other Ambulatory Visit: Payer: Self-pay | Admitting: Family

## 2015-08-23 DIAGNOSIS — R519 Headache, unspecified: Secondary | ICD-10-CM

## 2015-08-23 DIAGNOSIS — D693 Immune thrombocytopenic purpura: Secondary | ICD-10-CM

## 2015-08-23 DIAGNOSIS — R51 Headache: Secondary | ICD-10-CM

## 2015-08-28 ENCOUNTER — Other Ambulatory Visit: Payer: Self-pay | Admitting: Family

## 2015-08-28 ENCOUNTER — Other Ambulatory Visit: Payer: Medicare Other

## 2015-08-28 ENCOUNTER — Ambulatory Visit: Payer: Medicare Other | Admitting: Hematology & Oncology

## 2015-08-28 ENCOUNTER — Other Ambulatory Visit (HOSPITAL_BASED_OUTPATIENT_CLINIC_OR_DEPARTMENT_OTHER): Payer: Medicare Other

## 2015-08-28 DIAGNOSIS — D693 Immune thrombocytopenic purpura: Secondary | ICD-10-CM

## 2015-08-28 DIAGNOSIS — Z8619 Personal history of other infectious and parasitic diseases: Secondary | ICD-10-CM

## 2015-08-28 DIAGNOSIS — W57XXXA Bitten or stung by nonvenomous insect and other nonvenomous arthropods, initial encounter: Secondary | ICD-10-CM

## 2015-08-28 DIAGNOSIS — I82409 Acute embolism and thrombosis of unspecified deep veins of unspecified lower extremity: Secondary | ICD-10-CM

## 2015-08-28 LAB — PROTIME-INR (CHCC SATELLITE)
INR: 1.2 — AB (ref 2.0–3.5)
Protime: 14.4 Seconds — ABNORMAL HIGH (ref 10.6–13.4)

## 2015-08-28 LAB — CBC WITH DIFFERENTIAL (CANCER CENTER ONLY)
BASO#: 0 10*3/uL (ref 0.0–0.2)
BASO%: 0.3 % (ref 0.0–2.0)
EOS%: 6.9 % (ref 0.0–7.0)
Eosinophils Absolute: 0.6 10*3/uL — ABNORMAL HIGH (ref 0.0–0.5)
HEMATOCRIT: 42.3 % (ref 38.7–49.9)
HEMOGLOBIN: 14.2 g/dL (ref 13.0–17.1)
LYMPH#: 2.1 10*3/uL (ref 0.9–3.3)
LYMPH%: 24.6 % (ref 14.0–48.0)
MCH: 30 pg (ref 28.0–33.4)
MCHC: 33.6 g/dL (ref 32.0–35.9)
MCV: 89 fL (ref 82–98)
MONO#: 1.4 10*3/uL — ABNORMAL HIGH (ref 0.1–0.9)
MONO%: 16.4 % — ABNORMAL HIGH (ref 0.0–13.0)
NEUT%: 51.8 % (ref 40.0–80.0)
NEUTROS ABS: 4.5 10*3/uL (ref 1.5–6.5)
Platelets: 84 10*3/uL — ABNORMAL LOW (ref 145–400)
RBC: 4.74 10*6/uL (ref 4.20–5.70)
RDW: 16 % — ABNORMAL HIGH (ref 11.1–15.7)
WBC: 8.7 10*3/uL (ref 4.0–10.0)

## 2015-08-28 MED ORDER — DOXYCYCLINE HYCLATE 100 MG PO TABS: 100.0000 mg | ORAL_TABLET | Freq: Two times a day (BID) | ORAL | Status: DC

## 2015-08-28 NOTE — Progress Notes (Signed)
Mark [name] was bit by a small tick 2 days ago and now has some redness and swelling in his left earlobe. He has history of Lymes disease. We will go ahead and put him on Doxycycline 100 mg BID for 14 days. His INR today is 1.2. We will have him restart his Coumadin at his previous dose of alternating 4 mg and 2 mg. They are in agreement with the plan.  We will recheck his INR in 2 weeks. His wife will contact us if he has any episodes of bleeding, bruising or petechiae.

## 2015-09-01 ENCOUNTER — Ambulatory Visit
Admission: RE | Admit: 2015-09-01 | Discharge: 2015-09-01 | Disposition: A | Discharge status: Home / Self Care | Payer: Medicare Other | Source: Ambulatory Visit | Attending: Family | Admitting: Family

## 2015-09-01 DIAGNOSIS — R519 Headache, unspecified: Secondary | ICD-10-CM

## 2015-09-01 DIAGNOSIS — D693 Immune thrombocytopenic purpura: Secondary | ICD-10-CM

## 2015-09-01 DIAGNOSIS — R51 Headache: Secondary | ICD-10-CM

## 2015-09-01 MED ORDER — GADOBENATE DIMEGLUMINE 529 MG/ML IV SOLN
20.0000 mL | Freq: Once | INTRAVENOUS | Status: AC | PRN
  Administered 2015-09-01: 20 mL via INTRAVENOUS

## 2015-09-04 ENCOUNTER — Other Ambulatory Visit: Payer: Medicare Other

## 2015-09-15 ENCOUNTER — Other Ambulatory Visit (HOSPITAL_BASED_OUTPATIENT_CLINIC_OR_DEPARTMENT_OTHER): Payer: Medicare Other

## 2015-09-15 ENCOUNTER — Ambulatory Visit (HOSPITAL_BASED_OUTPATIENT_CLINIC_OR_DEPARTMENT_OTHER): Payer: Medicare Other | Admitting: Hematology & Oncology

## 2015-09-15 VITALS — BP 107/58 | HR 75 | Temp 98.3°F | Resp 18 | Ht 69.0 in | Wt 201.0 lb

## 2015-09-15 DIAGNOSIS — Z8673 Personal history of transient ischemic attack (TIA), and cerebral infarction without residual deficits: Secondary | ICD-10-CM

## 2015-09-15 DIAGNOSIS — D693 Immune thrombocytopenic purpura: Secondary | ICD-10-CM | POA: Diagnosis not present

## 2015-09-15 DIAGNOSIS — I82409 Acute embolism and thrombosis of unspecified deep veins of unspecified lower extremity: Secondary | ICD-10-CM

## 2015-09-15 DIAGNOSIS — M21372 Foot drop, left foot: Secondary | ICD-10-CM | POA: Diagnosis not present

## 2015-09-15 DIAGNOSIS — I639 Cerebral infarction, unspecified: Secondary | ICD-10-CM

## 2015-09-15 LAB — CBC WITH DIFFERENTIAL (CANCER CENTER ONLY)
BASO#: 0.1 10*3/uL (ref 0.0–0.2)
BASO%: 0.6 % (ref 0.0–2.0)
EOS%: 7.1 % — ABNORMAL HIGH (ref 0.0–7.0)
Eosinophils Absolute: 0.8 10*3/uL — ABNORMAL HIGH (ref 0.0–0.5)
HCT: 43.4 % (ref 38.7–49.9)
HGB: 14.5 g/dL (ref 13.0–17.1)
LYMPH#: 1.7 10*3/uL (ref 0.9–3.3)
LYMPH%: 15.4 % (ref 14.0–48.0)
MCH: 30.1 pg (ref 28.0–33.4)
MCHC: 33.4 g/dL (ref 32.0–35.9)
MCV: 90 fL (ref 82–98)
MONO#: 1.9 10*3/uL — ABNORMAL HIGH (ref 0.1–0.9)
MONO%: 17.4 % — AB (ref 0.0–13.0)
NEUT#: 6.4 10*3/uL (ref 1.5–6.5)
NEUT%: 59.5 % (ref 40.0–80.0)
PLATELETS: 153 10*3/uL (ref 145–400)
RBC: 4.81 10*6/uL (ref 4.20–5.70)
RDW: 15.8 % — ABNORMAL HIGH (ref 11.1–15.7)
WBC: 10.8 10*3/uL — AB (ref 4.0–10.0)

## 2015-09-15 LAB — COMPREHENSIVE METABOLIC PANEL
ALBUMIN: 3.9 g/dL (ref 3.5–5.0)
ALT: 48 U/L (ref 0–55)
ANION GAP: 8 meq/L (ref 3–11)
AST: 36 U/L — ABNORMAL HIGH (ref 5–34)
Alkaline Phosphatase: 68 U/L (ref 40–150)
BUN: 20.4 mg/dL (ref 7.0–26.0)
CO2: 26 mEq/L (ref 22–29)
Calcium: 9.5 mg/dL (ref 8.4–10.4)
Chloride: 106 mEq/L (ref 98–109)
Creatinine: 1.4 mg/dL — ABNORMAL HIGH (ref 0.7–1.3)
EGFR: 49 mL/min/{1.73_m2} — AB (ref >90)
GLUCOSE: 94 mg/dL (ref 70–140)
POTASSIUM: 4.1 meq/L (ref 3.5–5.1)
SODIUM: 140 meq/L (ref 136–145)
Total Bilirubin: 0.54 mg/dL (ref 0.20–1.20)
Total Protein: 7.3 g/dL (ref 6.4–8.3)

## 2015-09-15 LAB — PROTIME-INR (CHCC SATELLITE)

## 2015-09-15 LAB — PROTHROMBIN TIME (PT)
INR: 5.5 — ABNORMAL HIGH (ref 0.8–1.2)
PROTHROMBIN TIME: 61 s — AB (ref 9.1–12.0)

## 2015-09-15 NOTE — Progress Notes (Signed)
Hematology and Oncology Follow Up Visit  Mark [name] [id number] 1945/08/10 70 y.o. 09/15/2015   Principle Diagnosis:  1. Refractory immune thrombocytopenia. 2. 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 3. Positive lupus anticoagulant.  Current Therapy:   1. Coumadin to maintain INR between   2-3.    2. Decadron - the patient to take at his discretion.     Interim History:  Mark [name] is back for followup.he looks pretty good. He comes in with another daughter. She is very nice.  He has a custom-made brace for his left lower leg. This helps with the foot drop and last him to walk better.  We did do an MRI of his brain area and this was because of some fluctuating neurological symptoms. The MRI did not show anything new. 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 Otherwise, nothing else was noted.  He has had no cough. He's had no problem with bowels or bladder. He's had no nausea or vomiting.   He's had no problems with his back.  Currently, his Coumadin dose alternates between 2 mg day and 4 mg a day.  Overall, his performance status is ECOG 2-3.   Medications:  Current outpatient prescriptions:  .  acetaminophen (TYLENOL) 500 MG tablet, Take 1,000 mg by mouth every 8 (eight) hours as needed., Disp: , Rfl:  .  acetaminophen-codeine (TYLENOL #3) 300-30 MG tablet, , Disp: , Rfl: 0 .  alendronate (FOSAMAX) 70 MG tablet, Take 70 mg by mouth every 7 (seven) days. Take with a full glass of water on an empty stomach. , Disp: , Rfl:  .  amLODipine (NORVASC) 2.5 MG tablet, , Disp: , Rfl:  .  bethanechol (URECHOLINE) 25 MG tablet, Take 25 mg by mouth 2 (two) times daily. , Disp: , Rfl:  .  buPROPion (WELLBUTRIN XL) 150 MG 24 hr tablet, , Disp: , Rfl:  .  Cholecalciferol (VITAMIN D) 2000 UNITS tablet, Take 2,000 Units by mouth 2 (two) times daily. 4,000 units, Disp: , Rfl:  .  CRESTOR 20 MG tablet, Take by mouth daily. , Disp:  , Rfl:  .  dexamethasone (DECADRON) 4 MG tablet, PRN ONLY  TAKES 40 MG DAILY FOR 4 DAYS THEN STOP, Disp: 120 tablet, Rfl: 1 .  diphenhydrAMINE (BENADRYL) 25 MG tablet, Take 25 mg by mouth every 6 (six) hours as needed (may take 1 to 2 tabs). , Disp: , Rfl:  .  doxycycline (VIBRA-TABS) 100 MG tablet, Take 1 tablet (100 mg total) by mouth 2 (two) times daily., Disp: 28 tablet, Rfl: 0 .  famotidine (PEPCID) 40 MG tablet, Take 40 mg by mouth daily. , Disp: , Rfl:  .  fluticasone (FLONASE) 50 MCG/ACT nasal spray, Place 1 spray into both nostrils as needed. , Disp: , Rfl:  .  levETIRAcetam (KEPPRA) 500 MG tablet, 500 mg. 3 tabs  in am and 3 tabs in pm, Disp: , Rfl:  .  Multiple Vitamins-Minerals (MULTIVITAMIN WITH MINERALS) tablet, Take 1 tablet by mouth daily. , Disp: , Rfl:  .  NYSTATIN, TOPICAL, POWD, Apply 1 application topically 2 (two) times daily., Disp: 1 Bottle, Rfl: 4 .  rosuvastatin (CRESTOR) 20 MG tablet, Take 1 tablet (20 mg total) by mouth daily., Disp: 30 tablet, Rfl: 6 .  sertraline (ZOLOFT) 100 MG tablet, Take 100 mg by mouth 2 (two) times daily. 1 tab in AM and 1/2 tab in PM=150 mg daily, Disp: , Rfl:  .  Tamsulosin HCl (FLOMAX) 0.4 MG CAPS, Take 0.8 mg by mouth daily. 2 CAP = 0.8 MG, Disp: , Rfl:  .  traMADol (ULTRAM) 50 MG tablet, TAKE ONE TABLET BY MOUTH EVERY 6 HOURS AS NEEDED FOR PAIN, Disp: 60 tablet, Rfl: 0 .  VITAMIN K PO, Take 100 mcg by mouth daily., Disp: , Rfl:  .  warfarin (COUMADIN) 2 MG tablet, TAKE 4 MG(2 TABLETS TOTAL)BY MOUTH ON THE FIRST DAY, THEN 2 MG(1 TABLET) ON THE SECOND DAY,REPEAT THIS PATTERN UNTIL DOCTOR CHANGES, Disp: 90 tablet, Rfl: 0 .  warfarin (COUMADIN) 2 MG tablet, TAKE 2 TABLETS BY MOUTH ON THE FIRST DAY, THEN TAKE 1 TABLET THE SECOND DAY, REPEAT THIS PATTERN UNTIL DOCTOR CHANGES, Disp: 90 tablet, Rfl: 0  Allergies: No Known Allergies  Past Medical History, Surgical history, Social history, and Family History were reviewed and updated.  Review of  Systems:  as above   Physical Exam:  height is 5\' 9"  (1.753 m) and weight is 201 lb (91.173 kg). His oral temperature is 98.3 F (36.8 C). His blood pressure is 107/58 and his pulse is 75. His respiration is 18.    Well-developed well-nourished white gentleman. Head and neck exam shows no ocular or oral reason. There is no adenopathy in the neck. Lungs are clear. Cardiac exam regular rate and rhythm with no murmurs, rubs or bruits. His abdomen is soft. There is no. fluid wave. There is no probable liver or spleen tip. Extremities shows no clubbing cyanosis or edema. He has chronic weakness in his left side. Skin exam shows no rashes, ecchymosis or petechia. Neurological exam shows a weakness on his left-sided, more so in the left arm then left leg.  Lab Results  Component Value Date   WBC 10.8* 09/15/2015   HGB 14.5 09/15/2015   HCT 43.4 09/15/2015   MCV 90 09/15/2015   PLT 153 09/15/2015     Chemistry      Component Value Date/Time   NA 141 08/21/2015 1524   NA 138 06/23/2015 1349   NA 142 03/02/2015 1520   NA 142 12/23/2014 1125   K 4.4 08/21/2015 1524   K 4.3 06/23/2015 1349   K 4.3 03/02/2015 1520   K 4.3 12/23/2014 1125   CL 110* 08/21/2015 1524   CL 108 03/02/2015 1520   CL 106 12/23/2014 1125   CO2 22 08/21/2015 1524   CO2 19* 06/23/2015 1349   CO2 22 03/02/2015 1520   CO2 26 12/23/2014 1125   BUN 26 08/21/2015 1524   BUN 40.0* 06/23/2015 1349   BUN 26* 03/02/2015 1520   BUN 22 12/23/2014 1125   CREATININE 1.11 08/21/2015 1524   CREATININE 1.3 06/23/2015 1349   CREATININE 0.99 03/02/2015 1520   CREATININE 1.0 12/23/2014 1125      Component Value Date/Time   CALCIUM 9.1 08/21/2015 1524   CALCIUM 9.0 06/23/2015 1349   CALCIUM 9.1 03/02/2015 1520   CALCIUM 9.5 12/23/2014 1125   ALKPHOS 62 08/21/2015 1524   ALKPHOS 67 06/23/2015 1349   ALKPHOS 66 03/02/2015 1520   ALKPHOS 64 12/23/2014 1125   AST 29 08/21/2015 1524   AST 25 06/23/2015 1349   AST 33 03/02/2015  1520   AST 46* 12/23/2014 1125   ALT 39 08/21/2015 1524   ALT 82* 06/23/2015 1349   ALT 38 03/02/2015 1520   ALT 51* 12/23/2014 1125   BILITOT 0.4 08/21/2015 1524   BILITOT 0.52 06/23/2015 1349   BILITOT 0.5 03/02/2015 1520  BILITOT 0.70 12/23/2014 1125         Impression and Plan: Mark [name] is 70 year old gentleman with refractory immune thrombocytopenia. He also has a history of a thrombotic CVA. He's had residual left-sided weakness. This probably was back in October of 2012.   I think would probably have to cut his Coumadin Back. He is on 2 mg alternating with 4 mg. I think I want him to go back to 2 mg a day. His INR is 5.1.   Because we are changing his Coumadin dose, I will need to get him back in a week so we can monitor his INR.  We will check his INR next week. It can be tough on occasion to try and maintain his INR at a therapeutic level.  I will plan to get him back to see [place] in about 4-5 weeks.    Mark Napoleon, MD 6/2/201712:48 PM

## 2015-09-18 ENCOUNTER — Other Ambulatory Visit: Payer: Medicare Other

## 2015-09-21 ENCOUNTER — Telehealth: Payer: Self-pay | Admitting: Family

## 2015-09-21 ENCOUNTER — Telehealth: Payer: Self-pay | Admitting: *Deleted

## 2015-09-21 ENCOUNTER — Other Ambulatory Visit (HOSPITAL_BASED_OUTPATIENT_CLINIC_OR_DEPARTMENT_OTHER): Payer: Medicare Other

## 2015-09-21 DIAGNOSIS — D693 Immune thrombocytopenic purpura: Secondary | ICD-10-CM | POA: Diagnosis not present

## 2015-09-21 DIAGNOSIS — I639 Cerebral infarction, unspecified: Secondary | ICD-10-CM

## 2015-09-21 LAB — CBC WITH DIFFERENTIAL (CANCER CENTER ONLY)
BASO#: 0 10*3/uL (ref 0.0–0.2)
BASO%: 0.5 % (ref 0.0–2.0)
EOS ABS: 1.1 10*3/uL — AB (ref 0.0–0.5)
EOS%: 12.3 % — ABNORMAL HIGH (ref 0.0–7.0)
HCT: 42.5 % (ref 38.7–49.9)
HEMOGLOBIN: 14.4 g/dL (ref 13.0–17.1)
LYMPH#: 1.5 10*3/uL (ref 0.9–3.3)
LYMPH%: 17 % (ref 14.0–48.0)
MCH: 30.2 pg (ref 28.0–33.4)
MCHC: 33.9 g/dL (ref 32.0–35.9)
MCV: 89 fL (ref 82–98)
MONO#: 2 10*3/uL — AB (ref 0.1–0.9)
MONO%: 23.2 % — ABNORMAL HIGH (ref 0.0–13.0)
NEUT#: 4 10*3/uL (ref 1.5–6.5)
NEUT%: 47 % (ref 40.0–80.0)
PLATELETS: 9 10*3/uL — AB (ref 145–400)
RBC: 4.77 10*6/uL (ref 4.20–5.70)
RDW: 15.5 % (ref 11.1–15.7)
WBC: 8.6 10*3/uL (ref 4.0–10.0)

## 2015-09-21 LAB — PROTIME-INR (CHCC SATELLITE)
INR: 2 (ref 2.0–3.5)
Protime: 24 Seconds — ABNORMAL HIGH (ref 10.6–13.4)

## 2015-09-21 LAB — TECHNOLOGIST REVIEW CHCC SATELLITE

## 2015-09-21 NOTE — Telephone Encounter (Signed)
Critical Value Platelets 9 Dr Marin Olp notified. Patient is to start steroid protocol. Patient and wife aware.

## 2015-09-21 NOTE — Telephone Encounter (Signed)
I spoke with Saddie Benders, the patient's wife, and let her know Dr. Marin Olp would like him to remain on Coumadin and start Decadron 40 mg for the next 4 days for platelet count of 9 and INR of 2.0. He has petechiae on his his arms, chest and legs. No episodes of bleeding. She will notify our office if he does have any episodes and take hi to the ED over the weekend if needed. We will follow-up with her on Monday to see how he is feeling.

## 2015-09-27 ENCOUNTER — Other Ambulatory Visit: Payer: Medicare Other

## 2015-09-28 ENCOUNTER — Other Ambulatory Visit (HOSPITAL_BASED_OUTPATIENT_CLINIC_OR_DEPARTMENT_OTHER): Payer: Medicare Other

## 2015-09-28 ENCOUNTER — Telehealth: Payer: Self-pay | Admitting: Nurse Practitioner

## 2015-09-28 DIAGNOSIS — D693 Immune thrombocytopenic purpura: Secondary | ICD-10-CM | POA: Diagnosis not present

## 2015-09-28 LAB — COMPREHENSIVE METABOLIC PANEL (CC13)
ALBUMIN: 4 g/dL (ref 3.5–4.8)
ALK PHOS: 66 IU/L (ref 39–117)
ALT: 93 IU/L — ABNORMAL HIGH (ref 0–44)
AST: 41 IU/L — AB (ref 0–40)
Albumin/Globulin Ratio: 1.4 (ref 1.2–2.2)
BILIRUBIN TOTAL: 0.4 mg/dL (ref 0.0–1.2)
BUN / CREAT RATIO: 23 (ref 10–24)
BUN: 35 mg/dL — ABNORMAL HIGH (ref 8–27)
CHLORIDE: 104 mmol/L (ref 96–106)
CO2: 22 mmol/L (ref 18–29)
CREATININE: 1.52 mg/dL — AB (ref 0.76–1.27)
Calcium, Ser: 9.4 mg/dL (ref 8.6–10.2)
GFR calc Af Amer: 53 mL/min/{1.73_m2} — ABNORMAL LOW (ref >59)
GFR calc non Af Amer: 46 mL/min/{1.73_m2} — ABNORMAL LOW (ref >59)
GLOBULIN, TOTAL: 2.8 g/dL (ref 1.5–4.5)
GLUCOSE: 120 mg/dL — AB (ref 65–99)
Potassium, Ser: 4.5 mmol/L (ref 3.5–5.2)
SODIUM: 135 mmol/L (ref 134–144)
Total Protein: 6.8 g/dL (ref 6.0–8.5)

## 2015-09-28 LAB — CBC WITH DIFFERENTIAL (CANCER CENTER ONLY)
BASO#: 0.1 10*3/uL (ref 0.0–0.2)
BASO%: 0.4 % (ref 0.0–2.0)
EOS%: 10.1 % — AB (ref 0.0–7.0)
Eosinophils Absolute: 1.4 10*3/uL — ABNORMAL HIGH (ref 0.0–0.5)
HEMATOCRIT: 45 % (ref 38.7–49.9)
HEMOGLOBIN: 15.6 g/dL (ref 13.0–17.1)
LYMPH#: 2.7 10*3/uL (ref 0.9–3.3)
LYMPH%: 18.8 % (ref 14.0–48.0)
MCH: 30.5 pg (ref 28.0–33.4)
MCHC: 34.7 g/dL (ref 32.0–35.9)
MCV: 88 fL (ref 82–98)
MONO#: 2.3 10*3/uL — ABNORMAL HIGH (ref 0.1–0.9)
MONO%: 16.4 % — ABNORMAL HIGH (ref 0.0–13.0)
NEUT%: 54.3 % (ref 40.0–80.0)
NEUTROS ABS: 7.8 10*3/uL — AB (ref 1.5–6.5)
RBC: 5.11 10*6/uL (ref 4.20–5.70)
RDW: 16.1 % — ABNORMAL HIGH (ref 11.1–15.7)
WBC: 14.3 10*3/uL — ABNORMAL HIGH (ref 4.0–10.0)

## 2015-09-28 LAB — PROTIME-INR (CHCC SATELLITE)
INR: 1.4 — AB (ref 2.0–3.5)
PROTIME: 16.8 s — AB (ref 10.6–13.4)

## 2015-09-29 NOTE — Telephone Encounter (Signed)
ERRONEOUS

## 2015-10-02 ENCOUNTER — Other Ambulatory Visit: Payer: Medicare Other

## 2015-10-05 ENCOUNTER — Other Ambulatory Visit: Payer: Medicare Other

## 2015-10-09 ENCOUNTER — Other Ambulatory Visit: Payer: Self-pay | Admitting: Hematology & Oncology

## 2015-10-11 ENCOUNTER — Other Ambulatory Visit (HOSPITAL_BASED_OUTPATIENT_CLINIC_OR_DEPARTMENT_OTHER): Payer: Medicare Other

## 2015-10-11 DIAGNOSIS — Z8673 Personal history of transient ischemic attack (TIA), and cerebral infarction without residual deficits: Secondary | ICD-10-CM | POA: Diagnosis not present

## 2015-10-11 DIAGNOSIS — D693 Immune thrombocytopenic purpura: Secondary | ICD-10-CM | POA: Diagnosis not present

## 2015-10-11 DIAGNOSIS — I639 Cerebral infarction, unspecified: Secondary | ICD-10-CM

## 2015-10-11 LAB — CBC WITH DIFFERENTIAL (CANCER CENTER ONLY)
BASO#: 0.1 10*3/uL (ref 0.0–0.2)
BASO%: 0.5 % (ref 0.0–2.0)
EOS%: 5.8 % (ref 0.0–7.0)
Eosinophils Absolute: 0.5 10*3/uL (ref 0.0–0.5)
HEMATOCRIT: 42.5 % (ref 38.7–49.9)
HGB: 14.3 g/dL (ref 13.0–17.1)
LYMPH#: 2.2 10*3/uL (ref 0.9–3.3)
LYMPH%: 23.9 % (ref 14.0–48.0)
MCH: 30.2 pg (ref 28.0–33.4)
MCHC: 33.6 g/dL (ref 32.0–35.9)
MCV: 90 fL (ref 82–98)
MONO#: 1.4 10*3/uL — AB (ref 0.1–0.9)
MONO%: 15.4 % — ABNORMAL HIGH (ref 0.0–13.0)
NEUT#: 5 10*3/uL (ref 1.5–6.5)
NEUT%: 54.4 % (ref 40.0–80.0)
PLATELETS: 182 10*3/uL (ref 145–400)
RBC: 4.74 10*6/uL (ref 4.20–5.70)
RDW: 15.6 % (ref 11.1–15.7)
WBC: 9.3 10*3/uL (ref 4.0–10.0)

## 2015-10-11 LAB — PROTIME-INR (CHCC SATELLITE)
INR: 3.7 — ABNORMAL HIGH (ref 2.0–3.5)
Protime: 44.4 Seconds — ABNORMAL HIGH (ref 10.6–13.4)

## 2015-10-12 ENCOUNTER — Telehealth: Payer: Self-pay | Admitting: *Deleted

## 2015-10-12 NOTE — Telephone Encounter (Signed)
Dr Marin Olp reviewed patient labs and wanted a change to his coumadin regimen.  Patient is currently on an alternating scheduled of coumadin 4mg  with 2mg . He was supposed to take 4mg  last pm, but his wife stated he fell asleep early and didn't take.   Dr Marin Olp states that it's a good thing that patient missed last night's dose due to INR of 3.7. He wants patient to resume coumadin tonight at 2mg  and his new schedule is coumadin 2mg , followed by 2mg , then 4 mg. This schedule is to be repeated every three days.  Spoke with wife and she wrote down new schedule. She confirmed understanding with teach back.

## 2015-10-16 ENCOUNTER — Other Ambulatory Visit: Payer: Medicare Other

## 2015-10-19 ENCOUNTER — Ambulatory Visit: Payer: Medicare Other | Admitting: Hematology & Oncology

## 2015-10-19 ENCOUNTER — Other Ambulatory Visit: Payer: Medicare Other

## 2015-10-20 ENCOUNTER — Encounter: Payer: Self-pay | Admitting: Family

## 2015-10-20 ENCOUNTER — Other Ambulatory Visit (HOSPITAL_BASED_OUTPATIENT_CLINIC_OR_DEPARTMENT_OTHER): Payer: Medicare Other

## 2015-10-20 ENCOUNTER — Ambulatory Visit (HOSPITAL_BASED_OUTPATIENT_CLINIC_OR_DEPARTMENT_OTHER): Payer: Medicare Other | Admitting: Family

## 2015-10-20 VITALS — BP 109/67 | HR 79 | Temp 97.5°F | Resp 16 | Ht 69.0 in | Wt 202.0 lb

## 2015-10-20 DIAGNOSIS — D6862 Lupus anticoagulant syndrome: Secondary | ICD-10-CM

## 2015-10-20 DIAGNOSIS — D693 Immune thrombocytopenic purpura: Secondary | ICD-10-CM

## 2015-10-20 DIAGNOSIS — I639 Cerebral infarction, unspecified: Secondary | ICD-10-CM

## 2015-10-20 DIAGNOSIS — I82409 Acute embolism and thrombosis of unspecified deep veins of unspecified lower extremity: Secondary | ICD-10-CM

## 2015-10-20 DIAGNOSIS — Z8673 Personal history of transient ischemic attack (TIA), and cerebral infarction without residual deficits: Secondary | ICD-10-CM | POA: Diagnosis not present

## 2015-10-20 LAB — CBC WITH DIFFERENTIAL (CANCER CENTER ONLY)
BASO#: 0 10*3/uL (ref 0.0–0.2)
BASO%: 0.4 % (ref 0.0–2.0)
EOS%: 7.8 % — AB (ref 0.0–7.0)
Eosinophils Absolute: 0.8 10*3/uL — ABNORMAL HIGH (ref 0.0–0.5)
HCT: 42.9 % (ref 38.7–49.9)
HGB: 14.5 g/dL (ref 13.0–17.1)
LYMPH#: 2.7 10*3/uL (ref 0.9–3.3)
LYMPH%: 26.9 % (ref 14.0–48.0)
MCH: 30.6 pg (ref 28.0–33.4)
MCHC: 33.8 g/dL (ref 32.0–35.9)
MCV: 91 fL (ref 82–98)
MONO#: 1.7 10*3/uL — AB (ref 0.1–0.9)
MONO%: 17 % — ABNORMAL HIGH (ref 0.0–13.0)
NEUT#: 4.9 10*3/uL (ref 1.5–6.5)
NEUT%: 47.9 % (ref 40.0–80.0)
PLATELETS: 137 10*3/uL — AB (ref 145–400)
RBC: 4.74 10*6/uL (ref 4.20–5.70)
RDW: 15.6 % (ref 11.1–15.7)
WBC: 10.2 10*3/uL — ABNORMAL HIGH (ref 4.0–10.0)

## 2015-10-20 LAB — CMP (CANCER CENTER ONLY)
ALT(SGPT): 43 U/L (ref 10–47)
AST: 33 U/L (ref 11–38)
Albumin: 3.9 g/dL (ref 3.3–5.5)
Alkaline Phosphatase: 68 U/L (ref 26–84)
BUN: 20 mg/dL (ref 7–22)
CHLORIDE: 106 meq/L (ref 98–108)
CO2: 23 meq/L (ref 18–33)
CREATININE: 1.3 mg/dL — AB (ref 0.6–1.2)
Calcium: 9.1 mg/dL (ref 8.0–10.3)
GLUCOSE: 130 mg/dL — AB (ref 73–118)
POTASSIUM: 4 meq/L (ref 3.3–4.7)
SODIUM: 138 meq/L (ref 128–145)
TOTAL PROTEIN: 7.3 g/dL (ref 6.4–8.1)
Total Bilirubin: 0.6 mg/dl (ref 0.20–1.60)

## 2015-10-20 LAB — PROTIME-INR (CHCC SATELLITE)
INR: 3.1 (ref 2.0–3.5)
Protime: 37.2 Seconds — ABNORMAL HIGH (ref 10.6–13.4)

## 2015-10-20 NOTE — Progress Notes (Signed)
Hematology and Oncology Follow Up Visit  Mark [name]:[id number] [birth year] 70 y.o. 10/20/2015   Principle Diagnosis:  1. Refractory immune thrombocytopenia. 2. Positive lupus anticoagulant. 3. 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 Coumadin to maintain INR between(2-2-4 schedule)  2. Decadron - the patient to take at his discretion    Interim History:  Mark [name] is here today for a follow-up. He is doing fairly well. He had 2 falls but thankfully was not seriously injured. He is now back in PT 4 days a week for reconditioning. He has had no other falls and no syncopal episodes.  His bruising and petechiae has resolved. His INR is theraputic at 3.1. He has had no episodes of bleeding. He is still on the 100 mcg vitamin K daily.  No fever, chills, n/v, cough, rash, dizziness, SOB, chest pain, palpitations, abdominal pain or changes in bowel or bladder habits. No lymphadenopathy found on exam.  He continues to have a good appetite and is staying well hydrated. His weight is stable.   Medications:    Medication List       This list is accurate as of: 10/20/15  2:53 PM.  Always use your most recent med list.               acetaminophen-codeine 300-30 MG tablet  Commonly known as:  TYLENOL #3     alendronate 70 MG tablet  Commonly known as:  FOSAMAX  Take 70 mg by mouth every 7 (seven) days. Take with a full glass of water on an empty stomach.     amLODipine 2.5 MG tablet  Commonly known as:  NORVASC     BENADRYL 25 MG tablet  Generic drug:  diphenhydrAMINE  Take 25 mg by mouth every 6 (six) hours as needed (may take 1 to 2 tabs).     bethanechol 25 MG tablet  Commonly known as:  URECHOLINE  Take 25 mg by mouth 2 (two) times daily.     buPROPion 150 MG 24 hr tablet  Commonly known as:  WELLBUTRIN XL     dexamethasone 4 MG tablet  Commonly known as:  DECADRON  PRN ONLY  TAKES 40 MG DAILY FOR 4 DAYS THEN STOP     doxycycline 100 MG tablet  Commonly known as:  VIBRA-TABS  Take 1 tablet (100 mg total) by mouth 2 (two) times daily.     famotidine 40 MG tablet  Commonly known as:  PEPCID  Take 40 mg by mouth daily.     fluticasone 50 MCG/ACT nasal spray  Commonly known as:  FLONASE  Place 1 spray into both nostrils as needed.     levETIRAcetam 500 MG tablet  Commonly known as:  KEPPRA  500 mg. 3 tabs  in am and 3 tabs in pm     multivitamin with minerals tablet  Take 1 tablet by mouth daily.     NYSTATIN (TOPICAL) Powd  Apply 1 application topically 2 (two) times daily.     rosuvastatin 20 MG tablet  Commonly known as:  CRESTOR  Take 1 tablet (20 mg total) by mouth daily.     CRESTOR 20 MG tablet  Generic drug:  rosuvastatin  Take by mouth daily.     sertraline 100 MG tablet  Commonly known as:  ZOLOFT  Take 100 mg by mouth 2 (two) times daily. 1 tab in AM and 1/2 tab in PM=150 mg daily     tamsulosin  0.4 MG Caps capsule  Commonly known as:  FLOMAX  Take 0.8 mg by mouth daily. 2 CAP = 0.8 MG     traMADol 50 MG tablet  Commonly known as:  ULTRAM  TAKE ONE TABLET BY MOUTH EVERY 6 HOURS AS NEEDED FOR PAIN     TYLENOL 500 MG tablet  Generic drug:  acetaminophen  Take 1,000 mg by mouth every 8 (eight) hours as needed.     Vitamin D 2000 units tablet  Take 2,000 Units by mouth 2 (two) times daily. 4,000 units     VITAMIN K PO  Take 100 mcg by mouth daily.     warfarin 2 MG tablet  Commonly known as:  COUMADIN  TAKE 4 MG(2 TABLETS TOTAL)BY MOUTH ON THE FIRST DAY, THEN 2 MG(1 TABLET) ON THE SECOND DAY,REPEAT THIS PATTERN UNTIL DOCTOR CHANGES     warfarin 2 MG tablet  Commonly known as:  COUMADIN  TAKE 2 TABLETS BY MOUTH ON THE FIRST DAY, THEN TAKE 1 TAB THE SECOND DAY. REPEAT THIS PATTERN UNTIL DOCTOR CHANGES.        Allergies: No Known Allergies  Past Medical History, Surgical history, Social history, and Family History were reviewed and updated.  Review of  Systems: All other 10 point review of systems is negative.   Physical Exam:  vitals were not taken for this visit.  Wt Readings from Last 3 Encounters:  09/15/15 201 lb (91.173 kg)  08/21/15 200 lb (90.719 kg)  06/23/15 199 lb (90.266 kg)    Ocular: Sclerae unicteric, pupils equal, round and reactive to light Ear-nose-throat: Oropharynx clear, dentition fair Lymphatic: No cervical supraclavicular or axillary adenopathy Lungs no rales or rhonchi, good excursion bilaterally Heart regular rate and rhythm, no murmur appreciated Abd soft, nontender, positive bowel sounds, no liver or spleen tip palpated on exam, no fluid wave MSK no focal spinal tenderness, no joint edema Neuro: non-focal, well-oriented, appropriate affect Breasts: Deferred  Lab Results  Component Value Date   WBC 10.2* 10/20/2015   HGB 14.5 10/20/2015   HCT 42.9 10/20/2015   MCV 91 10/20/2015   PLT 137* 10/20/2015   Lab Results  Component Value Date   FERRITIN 62 08/20/2010   Lab Results  Component Value Date   RETICCTPCT 1.5 08/20/2010   RBC 4.74 10/20/2015   RETICCTABS 75.3 08/20/2010   No results found for: KPAFRELGTCHN, LAMBDASER, KAPLAMBRATIO No results found for: IGGSERUM, IGA, IGMSERUM No results found for: Odetta Pink, SPEI   Chemistry      Component Value Date/Time   NA 135 09/28/2015 1509   NA 140 09/15/2015 1139   NA 142 03/02/2015 1520   NA 142 12/23/2014 1125   K 4.5 09/28/2015 1509   K 4.1 09/15/2015 1139   K 4.3 03/02/2015 1520   K 4.3 12/23/2014 1125   CL 104 09/28/2015 1509   CL 108 03/02/2015 1520   CL 106 12/23/2014 1125   CO2 22 09/28/2015 1509   CO2 26 09/15/2015 1139   CO2 22 03/02/2015 1520   CO2 26 12/23/2014 1125   BUN 35* 09/28/2015 1509   BUN 20.4 09/15/2015 1139   BUN 26* 03/02/2015 1520   BUN 22 12/23/2014 1125   CREATININE 1.52* 09/28/2015 1509   CREATININE 1.4* 09/15/2015 1139   CREATININE 0.99  03/02/2015 1520   CREATININE 1.0 12/23/2014 1125      Component Value Date/Time   CALCIUM 9.4 09/28/2015 1509   CALCIUM 9.5 09/15/2015  1139   CALCIUM 9.1 03/02/2015 1520   CALCIUM 9.5 12/23/2014 1125   ALKPHOS 66 09/28/2015 1509   ALKPHOS 68 09/15/2015 1139   ALKPHOS 66 03/02/2015 1520   ALKPHOS 64 12/23/2014 1125   AST 41* 09/28/2015 1509   AST 36* 09/15/2015 1139   AST 33 03/02/2015 1520   AST 46* 12/23/2014 1125   ALT 93* 09/28/2015 1509   ALT 48 09/15/2015 1139   ALT 38 03/02/2015 1520   ALT 51* 12/23/2014 1125   BILITOT 0.4 09/28/2015 1509   BILITOT 0.54 09/15/2015 1139   BILITOT 0.5 03/02/2015 1520   BILITOT 0.70 12/23/2014 1125     Impression and Plan: Mark [name] is 70 year old gentleman with refractory immune thrombocytopenia. 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  He is theraputic at this time with an INR of 3.1. He will continue on his current Coumadin regimen.  We will continue checking his labs every 2 weeks and then plan to see him back for a follow-up in 1 months.  Both he and his wife know to contact us with any other questions or concerns. We can certainly see him sooner if need be.   Eliezer Bottom, NP 7/7/20172:53 PM

## 2015-10-30 ENCOUNTER — Other Ambulatory Visit: Payer: Medicare Other

## 2015-11-02 ENCOUNTER — Telehealth: Payer: Self-pay

## 2015-11-02 ENCOUNTER — Other Ambulatory Visit (HOSPITAL_BASED_OUTPATIENT_CLINIC_OR_DEPARTMENT_OTHER): Payer: Medicare Other

## 2015-11-02 DIAGNOSIS — I639 Cerebral infarction, unspecified: Secondary | ICD-10-CM

## 2015-11-02 DIAGNOSIS — D693 Immune thrombocytopenic purpura: Secondary | ICD-10-CM | POA: Diagnosis not present

## 2015-11-02 LAB — PROTIME-INR (CHCC SATELLITE)
INR: 4.3 — AB (ref 2.0–3.5)
PROTIME: 51.6 s — AB (ref 10.6–13.4)

## 2015-11-02 LAB — CBC WITH DIFFERENTIAL (CANCER CENTER ONLY)
BASO#: 0.1 10*3/uL (ref 0.0–0.2)
BASO%: 0.5 % (ref 0.0–2.0)
EOS%: 4.7 % (ref 0.0–7.0)
Eosinophils Absolute: 0.5 10*3/uL (ref 0.0–0.5)
HEMATOCRIT: 42.3 % (ref 38.7–49.9)
HEMOGLOBIN: 14.3 g/dL (ref 13.0–17.1)
LYMPH#: 3.4 10*3/uL — AB (ref 0.9–3.3)
LYMPH%: 34.6 % (ref 14.0–48.0)
MCH: 30.6 pg (ref 28.0–33.4)
MCHC: 33.8 g/dL (ref 32.0–35.9)
MCV: 90 fL (ref 82–98)
MONO#: 2 10*3/uL — ABNORMAL HIGH (ref 0.1–0.9)
MONO%: 20.7 % — AB (ref 0.0–13.0)
NEUT#: 3.8 10*3/uL (ref 1.5–6.5)
NEUT%: 39.5 % — ABNORMAL LOW (ref 40.0–80.0)
Platelets: 269 10*3/uL (ref 145–400)
RBC: 4.68 10*6/uL (ref 4.20–5.70)
RDW: 15.7 % (ref 11.1–15.7)
WBC: 9.7 10*3/uL (ref 4.0–10.0)

## 2015-11-02 NOTE — Telephone Encounter (Signed)
Spoke with pt in lobby after lab appt. Pt had nose bleed last night. Platelets WNL. INR slightly elevated. Per Dr Marin Olp, pt to hold Coumadin today then resume as prescribed. Pt verbalizes understanding and repeats back instructions. dph

## 2015-11-13 ENCOUNTER — Other Ambulatory Visit (HOSPITAL_BASED_OUTPATIENT_CLINIC_OR_DEPARTMENT_OTHER): Payer: Medicare Other

## 2015-11-13 ENCOUNTER — Other Ambulatory Visit: Payer: Medicare Other

## 2015-11-13 DIAGNOSIS — D693 Immune thrombocytopenic purpura: Secondary | ICD-10-CM | POA: Diagnosis not present

## 2015-11-13 LAB — CBC WITH DIFFERENTIAL (CANCER CENTER ONLY)
BASO#: 0 10*3/uL (ref 0.0–0.2)
BASO%: 0.3 % (ref 0.0–2.0)
EOS%: 5.6 % (ref 0.0–7.0)
Eosinophils Absolute: 0.6 10*3/uL — ABNORMAL HIGH (ref 0.0–0.5)
HEMATOCRIT: 44.8 % (ref 38.7–49.9)
HEMOGLOBIN: 15.2 g/dL (ref 13.0–17.1)
LYMPH#: 3.1 10*3/uL (ref 0.9–3.3)
LYMPH%: 29.9 % (ref 14.0–48.0)
MCH: 30.6 pg (ref 28.0–33.4)
MCHC: 33.9 g/dL (ref 32.0–35.9)
MCV: 90 fL (ref 82–98)
MONO#: 1.9 10*3/uL — AB (ref 0.1–0.9)
MONO%: 17.8 % — ABNORMAL HIGH (ref 0.0–13.0)
NEUT%: 46.4 % (ref 40.0–80.0)
NEUTROS ABS: 4.9 10*3/uL (ref 1.5–6.5)
Platelets: 106 10*3/uL — ABNORMAL LOW (ref 145–400)
RBC: 4.96 10*6/uL (ref 4.20–5.70)
RDW: 15.5 % (ref 11.1–15.7)
WBC: 10.5 10*3/uL — ABNORMAL HIGH (ref 4.0–10.0)

## 2015-11-13 LAB — COMPREHENSIVE METABOLIC PANEL
ALT: 36 U/L (ref 0–55)
AST: 29 U/L (ref 5–34)
Albumin: 4.2 g/dL (ref 3.5–5.0)
Alkaline Phosphatase: 79 U/L (ref 40–150)
Anion Gap: 10 mEq/L (ref 3–11)
BILIRUBIN TOTAL: 0.53 mg/dL (ref 0.20–1.20)
BUN: 21 mg/dL (ref 7.0–26.0)
CHLORIDE: 106 meq/L (ref 98–109)
CO2: 22 meq/L (ref 22–29)
CREATININE: 1.3 mg/dL (ref 0.7–1.3)
Calcium: 9.6 mg/dL (ref 8.4–10.4)
EGFR: 53 mL/min/{1.73_m2} — ABNORMAL LOW (ref >90)
GLUCOSE: 108 mg/dL (ref 70–140)
Potassium: 4.2 mEq/L (ref 3.5–5.1)
SODIUM: 139 meq/L (ref 136–145)
TOTAL PROTEIN: 8 g/dL (ref 6.4–8.3)

## 2015-11-13 LAB — PROTIME-INR (CHCC SATELLITE)
INR: 3.7 — AB (ref 2.0–3.5)
Protime: 44.4 Seconds — ABNORMAL HIGH (ref 10.6–13.4)

## 2015-11-16 ENCOUNTER — Other Ambulatory Visit: Payer: Self-pay | Admitting: Family Medicine

## 2015-11-16 ENCOUNTER — Ambulatory Visit
Admission: RE | Admit: 2015-11-16 | Discharge: 2015-11-16 | Disposition: A | Discharge status: Home / Self Care | Payer: Medicare Other | Source: Ambulatory Visit | Attending: Family Medicine | Admitting: Family Medicine

## 2015-11-16 DIAGNOSIS — R0602 Shortness of breath: Secondary | ICD-10-CM

## 2015-11-27 ENCOUNTER — Encounter: Payer: Self-pay | Admitting: Emergency Medicine

## 2015-11-27 ENCOUNTER — Other Ambulatory Visit (HOSPITAL_BASED_OUTPATIENT_CLINIC_OR_DEPARTMENT_OTHER): Payer: Medicare Other

## 2015-11-27 DIAGNOSIS — D693 Immune thrombocytopenic purpura: Secondary | ICD-10-CM

## 2015-11-27 LAB — CBC WITH DIFFERENTIAL (CANCER CENTER ONLY)
BASO#: 0 10*3/uL (ref 0.0–0.2)
BASO%: 0.5 % (ref 0.0–2.0)
EOS%: 5.1 % (ref 0.0–7.0)
Eosinophils Absolute: 0.4 10*3/uL (ref 0.0–0.5)
HCT: 44.3 % (ref 38.7–49.9)
HGB: 15.2 g/dL (ref 13.0–17.1)
LYMPH#: 2.5 10*3/uL (ref 0.9–3.3)
LYMPH%: 29.9 % (ref 14.0–48.0)
MCH: 30.6 pg (ref 28.0–33.4)
MCHC: 34.3 g/dL (ref 32.0–35.9)
MCV: 89 fL (ref 82–98)
MONO#: 1.6 10*3/uL — ABNORMAL HIGH (ref 0.1–0.9)
MONO%: 18.9 % — AB (ref 0.0–13.0)
NEUT#: 3.8 10*3/uL (ref 1.5–6.5)
NEUT%: 45.6 % (ref 40.0–80.0)
PLATELETS: 276 10*3/uL (ref 145–400)
RBC: 4.97 10*6/uL (ref 4.20–5.70)
RDW: 14.9 % (ref 11.1–15.7)
WBC: 8.4 10*3/uL (ref 4.0–10.0)

## 2015-11-27 LAB — PROTIME-INR (CHCC SATELLITE)
INR: 2.6 (ref 2.0–3.5)
PROTIME: 31.2 s — AB (ref 10.6–13.4)

## 2015-11-27 NOTE — Progress Notes (Signed)
Patient is scheduled for a root canal on 8/17. Instructed patient per Dr Marin Olp to hold his coumadin on 8/16 and resume the coumadin on 8/17 at bedtime after the root canal. Patient and spouse verbalized understanding.

## 2015-11-28 ENCOUNTER — Telehealth: Payer: Self-pay | Admitting: Emergency Medicine

## 2015-11-28 NOTE — Telephone Encounter (Signed)
Left message on patient's voicemail instructing him of no changes to his coumadin dosing other than the previous instructions to hold this wednesdays dose for the procedure on Thursday and to resume Thursday evening.

## 2015-12-11 ENCOUNTER — Other Ambulatory Visit: Payer: Medicare Other

## 2015-12-11 ENCOUNTER — Ambulatory Visit: Payer: Medicare Other | Admitting: Hematology & Oncology

## 2015-12-13 ENCOUNTER — Other Ambulatory Visit (HOSPITAL_BASED_OUTPATIENT_CLINIC_OR_DEPARTMENT_OTHER): Payer: Medicare Other

## 2015-12-13 ENCOUNTER — Other Ambulatory Visit: Payer: Self-pay | Admitting: Hematology & Oncology

## 2015-12-13 DIAGNOSIS — D693 Immune thrombocytopenic purpura: Secondary | ICD-10-CM

## 2015-12-13 LAB — CBC WITH DIFFERENTIAL (CANCER CENTER ONLY)
BASO#: 0 10*3/uL (ref 0.0–0.2)
BASO%: 0.3 % (ref 0.0–2.0)
EOS ABS: 0.3 10*3/uL (ref 0.0–0.5)
EOS%: 3 % (ref 0.0–7.0)
HCT: 46.1 % (ref 38.7–49.9)
HEMOGLOBIN: 15.7 g/dL (ref 13.0–17.1)
LYMPH#: 2.8 10*3/uL (ref 0.9–3.3)
LYMPH%: 27.1 % (ref 14.0–48.0)
MCH: 30.4 pg (ref 28.0–33.4)
MCHC: 34.1 g/dL (ref 32.0–35.9)
MCV: 89 fL (ref 82–98)
MONO#: 2.2 10*3/uL — ABNORMAL HIGH (ref 0.1–0.9)
MONO%: 21.4 % — AB (ref 0.0–13.0)
NEUT%: 48.2 % (ref 40.0–80.0)
NEUTROS ABS: 5 10*3/uL (ref 1.5–6.5)
Platelets: 148 10*3/uL (ref 145–400)
RBC: 5.16 10*6/uL (ref 4.20–5.70)
RDW: 14.9 % (ref 11.1–15.7)
WBC: 10.3 10*3/uL — AB (ref 4.0–10.0)

## 2015-12-13 LAB — PROTIME-INR (CHCC SATELLITE)
INR: 2.7 (ref 2.0–3.5)
PROTIME: 32.4 s — AB (ref 10.6–13.4)

## 2015-12-18 ENCOUNTER — Other Ambulatory Visit: Payer: Self-pay | Admitting: Hematology & Oncology

## 2015-12-18 ENCOUNTER — Other Ambulatory Visit: Payer: Self-pay | Admitting: Family

## 2015-12-18 DIAGNOSIS — D693 Immune thrombocytopenic purpura: Secondary | ICD-10-CM

## 2015-12-27 ENCOUNTER — Ambulatory Visit (HOSPITAL_BASED_OUTPATIENT_CLINIC_OR_DEPARTMENT_OTHER): Payer: Medicare Other | Admitting: Hematology & Oncology

## 2015-12-27 ENCOUNTER — Other Ambulatory Visit (HOSPITAL_BASED_OUTPATIENT_CLINIC_OR_DEPARTMENT_OTHER): Payer: Medicare Other

## 2015-12-27 ENCOUNTER — Encounter: Payer: Self-pay | Admitting: Hematology & Oncology

## 2015-12-27 VITALS — BP 109/64 | HR 74 | Temp 98.5°F | Resp 16 | Ht 69.0 in | Wt 204.0 lb

## 2015-12-27 DIAGNOSIS — I69359 Hemiplegia and hemiparesis following cerebral infarction affecting unspecified side: Secondary | ICD-10-CM | POA: Diagnosis not present

## 2015-12-27 DIAGNOSIS — D693 Immune thrombocytopenic purpura: Secondary | ICD-10-CM

## 2015-12-27 DIAGNOSIS — Z8673 Personal history of transient ischemic attack (TIA), and cerebral infarction without residual deficits: Secondary | ICD-10-CM

## 2015-12-27 LAB — COMPREHENSIVE METABOLIC PANEL
ALBUMIN: 4.2 g/dL (ref 3.5–5.0)
ALK PHOS: 83 U/L (ref 40–150)
ALT: 53 U/L (ref 0–55)
AST: 49 U/L — AB (ref 5–34)
Anion Gap: 10 mEq/L (ref 3–11)
BILIRUBIN TOTAL: 0.58 mg/dL (ref 0.20–1.20)
BUN: 19.6 mg/dL (ref 7.0–26.0)
CO2: 24 meq/L (ref 22–29)
Calcium: 9.7 mg/dL (ref 8.4–10.4)
Chloride: 106 mEq/L (ref 98–109)
Creatinine: 1.5 mg/dL — ABNORMAL HIGH (ref 0.7–1.3)
EGFR: 47 mL/min/{1.73_m2} — ABNORMAL LOW (ref >90)
GLUCOSE: 109 mg/dL (ref 70–140)
Potassium: 4 mEq/L (ref 3.5–5.1)
SODIUM: 140 meq/L (ref 136–145)
TOTAL PROTEIN: 8 g/dL (ref 6.4–8.3)

## 2015-12-27 LAB — CBC WITH DIFFERENTIAL (CANCER CENTER ONLY)
BASO#: 0 10*3/uL (ref 0.0–0.2)
BASO%: 0.4 % (ref 0.0–2.0)
EOS ABS: 0.4 10*3/uL (ref 0.0–0.5)
EOS%: 5.3 % (ref 0.0–7.0)
HEMATOCRIT: 47.3 % (ref 38.7–49.9)
HEMOGLOBIN: 16.2 g/dL (ref 13.0–17.1)
LYMPH#: 2.2 10*3/uL (ref 0.9–3.3)
LYMPH%: 27.3 % (ref 14.0–48.0)
MCH: 30.2 pg (ref 28.0–33.4)
MCHC: 34.2 g/dL (ref 32.0–35.9)
MCV: 88 fL (ref 82–98)
MONO#: 1.3 10*3/uL — AB (ref 0.1–0.9)
MONO%: 16.1 % — ABNORMAL HIGH (ref 0.0–13.0)
NEUT%: 50.9 % (ref 40.0–80.0)
NEUTROS ABS: 4.1 10*3/uL (ref 1.5–6.5)
Platelets: 171 10*3/uL (ref 145–400)
RBC: 5.36 10*6/uL (ref 4.20–5.70)
RDW: 14.6 % (ref 11.1–15.7)
WBC: 8 10*3/uL (ref 4.0–10.0)

## 2015-12-27 LAB — PROTIME-INR (CHCC SATELLITE)
INR: 2.1 (ref 2.0–3.5)
Protime: 25.2 Seconds — ABNORMAL HIGH (ref 10.6–13.4)

## 2015-12-27 NOTE — Progress Notes (Signed)
Hematology and Oncology Follow Up Visit  Mark [name] [id number] 1946/02/14 70 y.o. 12/27/2015   Principle Diagnosis:  1. Refractory immune thrombocytopenia. 2. 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 3. Positive lupus anticoagulant.  Current Therapy:   1. Coumadin to maintain INR between   2-3.    2. Decadron - the patient to take at his discretion.     Interim History:  Mr.  [name] is back for followup.he looks pretty good. He comes in with his wife. They really had a good time at stroke camp. [place]. They really had a good time.  He has a custom-made brace for his left lower leg. This helps with the foot drop and last him to walk better.  He has had no cough. He's had no problem with bowels or bladder. He's had no nausea or vomiting.   He's had no problems with his back.  Currently, his Coumadin dose alternates between 2 mg day and 4 mg a day.  Overall, his performance status is ECOG 2-3.   Medications:  Current Outpatient Prescriptions:  .  acetaminophen (TYLENOL) 500 MG tablet, Take 1,000 mg by mouth every 8 (eight) hours as needed., Disp: , Rfl:  .  acetaminophen-codeine (TYLENOL #3) 300-30 MG tablet, , Disp: , Rfl: 0 .  alendronate (FOSAMAX) 70 MG tablet, Take 70 mg by mouth every 7 (seven) days. Take with a full glass of water on an empty stomach. , Disp: , Rfl:  .  amantadine (SYMMETREL) 100 MG capsule, , Disp: , Rfl: 0 .  amLODipine (NORVASC) 2.5 MG tablet, , Disp: , Rfl:  .  bethanechol (URECHOLINE) 25 MG tablet, Take 25 mg by mouth 2 (two) times daily. , Disp: , Rfl:  .  buPROPion (WELLBUTRIN XL) 150 MG 24 hr tablet, , Disp: , Rfl:  .  Cholecalciferol (VITAMIN D3) 1000 units CAPS, Take by mouth 2 (two) times daily after a meal., Disp: , Rfl:  .  CRESTOR 20 MG tablet, Take by mouth daily. , Disp: , Rfl:  .  dexamethasone (DECADRON) 4 MG tablet, TAKE AS NEEDED ONLY. TAKE 10 TABLET (40MG )  BY MOUTH DAILY FOR 4 DAYS THEN STOP, Disp: 90 tablet, Rfl: 2 .  diphenhydrAMINE (BENADRYL) 25 MG tablet, Take 25 mg by mouth every 6 (six) hours as needed (may take 1 to 2 tabs). , Disp: , Rfl:  .  famotidine (PEPCID) 40 MG tablet, Take 40 mg by mouth daily. , Disp: , Rfl:  .  fluticasone (FLONASE) 50 MCG/ACT nasal spray, Place 1 spray into both nostrils as needed. , Disp: , Rfl:  .  levETIRAcetam (KEPPRA) 500 MG tablet, 500 mg. 3 tabs  in am and 3 tabs in pm, Disp: , Rfl:  .  Multiple Vitamins-Minerals (MULTIVITAMIN WITH MINERALS) tablet, Take 1 tablet by mouth daily. , Disp: , Rfl:  .  NYSTATIN, TOPICAL, POWD, Apply 1 application topically 2 (two) times daily., Disp: 1 Bottle, Rfl: 4 .  sertraline (ZOLOFT) 100 MG tablet, Take 100 mg by mouth 2 (two) times daily. 1 tab in AM and 1/2 tab in PM=150 mg daily, Disp: , Rfl:  .  Tamsulosin HCl (FLOMAX) 0.4 MG CAPS, Take 0.8 mg by mouth daily. 2 CAP = 0.8 MG, Disp: , Rfl:  .  traMADol (ULTRAM) 50 MG tablet, TAKE ONE TABLET BY MOUTH EVERY 6 HOURS AS NEEDED FOR PAIN, Disp: 60 tablet, Rfl: 0 .  VITAMIN K PO,  Take 100 mcg by mouth daily., Disp: , Rfl:  .  warfarin (COUMADIN) 2 MG tablet, Take 2 mg by mouth daily., Disp: , Rfl:  .  rosuvastatin (CRESTOR) 20 MG tablet, Take 1 tablet (20 mg total) by mouth daily., Disp: 30 tablet, Rfl: 6  Allergies: No Known Allergies  Past Medical History, Surgical history, Social history, and Family History were reviewed and updated.  Review of Systems:  as above   Physical Exam:  height is 5\' 9"  (1.753 m) and weight is 204 lb (92.5 kg). His oral temperature is 98.5 F (36.9 C). His blood pressure is 109/64 and his pulse is 74. His respiration is 16.    Well-developed well-nourished white gentleman. Head and neck exam shows no ocular or oral reason. There is no adenopathy in the neck. Lungs are clear. Cardiac exam regular rate and rhythm with no murmurs, rubs or bruits. His abdomen is soft. There is no. fluid wave.  There is no probable liver or spleen tip. Extremities shows no clubbing cyanosis or edema. He has chronic weakness in his left side. Skin exam shows no rashes, ecchymosis or petechia. Neurological exam shows a weakness on his left-sided, more so in the left arm then left leg.  Lab Results  Component Value Date   WBC 8.0 12/27/2015   HGB 16.2 12/27/2015   HCT 47.3 12/27/2015   MCV 88 12/27/2015   PLT 171 12/27/2015     Chemistry      Component Value Date/Time   NA 139 11/13/2015 1146   K 4.2 11/13/2015 1146   CL 106 10/20/2015 1426   CO2 22 11/13/2015 1146   BUN 21.0 11/13/2015 1146   CREATININE 1.3 11/13/2015 1146      Component Value Date/Time   CALCIUM 9.6 11/13/2015 1146   ALKPHOS 79 11/13/2015 1146   AST 29 11/13/2015 1146   ALT 36 11/13/2015 1146   BILITOT 0.53 11/13/2015 1146         Impression and Plan: Mark [name] is 70 year old gentleman with refractory immune thrombocytopenia. He also has a history of a thrombotic CVA. He's had residual left-sided weakness. This probably was back in October of 2012.   I think that we can get him back every 3 weeks for lab work. Everything looks relatively stable right now.   I'll plan to see him back in 6 weeks.       Volanda Napoleon, MD 9/13/20172:26 PM

## 2016-01-17 ENCOUNTER — Other Ambulatory Visit (HOSPITAL_BASED_OUTPATIENT_CLINIC_OR_DEPARTMENT_OTHER): Payer: Medicare Other

## 2016-01-17 DIAGNOSIS — I69359 Hemiplegia and hemiparesis following cerebral infarction affecting unspecified side: Secondary | ICD-10-CM

## 2016-01-17 DIAGNOSIS — D693 Immune thrombocytopenic purpura: Secondary | ICD-10-CM

## 2016-01-17 LAB — COMPREHENSIVE METABOLIC PANEL
ALT: 30 U/L (ref 0–55)
AST: 28 U/L (ref 5–34)
Albumin: 4 g/dL (ref 3.5–5.0)
Alkaline Phosphatase: 110 U/L (ref 40–150)
Anion Gap: 10 mEq/L (ref 3–11)
BUN: 32.4 mg/dL — AB (ref 7.0–26.0)
CO2: 21 meq/L — AB (ref 22–29)
Calcium: 9.6 mg/dL (ref 8.4–10.4)
Chloride: 109 mEq/L (ref 98–109)
Creatinine: 1.3 mg/dL (ref 0.7–1.3)
EGFR: 56 mL/min/{1.73_m2} — AB (ref >90)
GLUCOSE: 118 mg/dL (ref 70–140)
POTASSIUM: 4 meq/L (ref 3.5–5.1)
SODIUM: 141 meq/L (ref 136–145)
TOTAL PROTEIN: 8 g/dL (ref 6.4–8.3)
Total Bilirubin: 0.41 mg/dL (ref 0.20–1.20)

## 2016-01-17 LAB — PROTIME-INR (CHCC SATELLITE)
INR: 2.3 (ref 2.0–3.5)
Protime: 27.6 Seconds — ABNORMAL HIGH (ref 10.6–13.4)

## 2016-01-17 LAB — CBC WITH DIFFERENTIAL (CANCER CENTER ONLY)
BASO#: 0 10*3/uL (ref 0.0–0.2)
BASO%: 0.3 % (ref 0.0–2.0)
EOS ABS: 0.5 10*3/uL (ref 0.0–0.5)
EOS%: 4.7 % (ref 0.0–7.0)
HCT: 44.7 % (ref 38.7–49.9)
HGB: 15.3 g/dL (ref 13.0–17.1)
LYMPH#: 2.5 10*3/uL (ref 0.9–3.3)
LYMPH%: 24.2 % (ref 14.0–48.0)
MCH: 30.2 pg (ref 28.0–33.4)
MCHC: 34.2 g/dL (ref 32.0–35.9)
MCV: 88 fL (ref 82–98)
MONO#: 1.9 10*3/uL — ABNORMAL HIGH (ref 0.1–0.9)
MONO%: 18.8 % — AB (ref 0.0–13.0)
NEUT%: 52 % (ref 40.0–80.0)
NEUTROS ABS: 5.4 10*3/uL (ref 1.5–6.5)
PLATELETS: 183 10*3/uL (ref 145–400)
RBC: 5.07 10*6/uL (ref 4.20–5.70)
RDW: 14.6 % (ref 11.1–15.7)
WBC: 10.3 10*3/uL — AB (ref 4.0–10.0)

## 2016-02-14 ENCOUNTER — Ambulatory Visit (HOSPITAL_BASED_OUTPATIENT_CLINIC_OR_DEPARTMENT_OTHER): Payer: Medicare Other | Admitting: Hematology & Oncology

## 2016-02-14 ENCOUNTER — Other Ambulatory Visit (HOSPITAL_BASED_OUTPATIENT_CLINIC_OR_DEPARTMENT_OTHER): Payer: Medicare Other

## 2016-02-14 VITALS — BP 130/75 | HR 70 | Temp 97.9°F | Resp 20 | Wt 206.0 lb

## 2016-02-14 DIAGNOSIS — I69359 Hemiplegia and hemiparesis following cerebral infarction affecting unspecified side: Secondary | ICD-10-CM

## 2016-02-14 DIAGNOSIS — D693 Immune thrombocytopenic purpura: Secondary | ICD-10-CM

## 2016-02-14 DIAGNOSIS — Z8673 Personal history of transient ischemic attack (TIA), and cerebral infarction without residual deficits: Secondary | ICD-10-CM

## 2016-02-14 DIAGNOSIS — I82401 Acute embolism and thrombosis of unspecified deep veins of right lower extremity: Secondary | ICD-10-CM

## 2016-02-14 LAB — CBC WITH DIFFERENTIAL (CANCER CENTER ONLY)
BASO#: 0.1 10*3/uL (ref 0.0–0.2)
BASO%: 0.5 % (ref 0.0–2.0)
EOS ABS: 0.5 10*3/uL (ref 0.0–0.5)
EOS%: 5.2 % (ref 0.0–7.0)
HEMATOCRIT: 44 % (ref 38.7–49.9)
HEMOGLOBIN: 15.1 g/dL (ref 13.0–17.1)
LYMPH#: 2.6 10*3/uL (ref 0.9–3.3)
LYMPH%: 26.3 % (ref 14.0–48.0)
MCH: 29.7 pg (ref 28.0–33.4)
MCHC: 34.3 g/dL (ref 32.0–35.9)
MCV: 87 fL (ref 82–98)
MONO#: 1.8 10*3/uL — AB (ref 0.1–0.9)
MONO%: 18.9 % — ABNORMAL HIGH (ref 0.0–13.0)
NEUT#: 4.8 10*3/uL (ref 1.5–6.5)
NEUT%: 49.1 % (ref 40.0–80.0)
Platelets: 150 10*3/uL (ref 145–400)
RBC: 5.08 10*6/uL (ref 4.20–5.70)
RDW: 14.9 % (ref 11.1–15.7)
WBC: 9.8 10*3/uL (ref 4.0–10.0)

## 2016-02-14 LAB — PROTIME-INR (CHCC SATELLITE)
INR: 1.3 — ABNORMAL LOW (ref 2.0–3.5)
Protime: 15.6 Seconds — ABNORMAL HIGH (ref 10.6–13.4)

## 2016-02-15 NOTE — Progress Notes (Signed)
Hematology and Oncology Follow Up Visit  Mark [name] [id number] 11-04-1945 70 y.o. 02/15/2016   Principle Diagnosis:  1. Refractory immune thrombocytopenia. 2. 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 3. Positive lupus anticoagulant.  Current Therapy:   1. Coumadin to maintain INR between   2-3.    2. Decadron - the patient to take at his discretion.     Interim History:  Mr.  [name] is back for followup.since last saw him, he fell and sustained a large ecchymoses and laceration over his right eye. He had to go to the emergency room. Thank you, he was not thrombocytopenic. He is on Coumadin. His INR was not high.  I think this happened about 3 or 4 weeks ago. He has healed up quite nicely.  He still has the weakness on the left side. He is doing some physical therapy.  He's had no change in bowel or bladder habits. His blood sugars have been doing okay. His blood pressure has been well controlled.  He's had no leg swelling.  He's had no change in neurological status.   Overall, his performance status is ECOG 2-3.   Medications:  Current Outpatient Prescriptions:  .  acetaminophen (TYLENOL) 500 MG tablet, Take 1,000 mg by mouth every 8 (eight) hours as needed., Disp: , Rfl:  .  alendronate (FOSAMAX) 70 MG tablet, Take 70 mg by mouth every 7 (seven) days. Take with a full glass of water on an empty stomach. , Disp: , Rfl:  .  amantadine (SYMMETREL) 100 MG capsule, , Disp: , Rfl: 0 .  amLODipine (NORVASC) 2.5 MG tablet, , Disp: , Rfl:  .  bethanechol (URECHOLINE) 25 MG tablet, Take 25 mg by mouth 2 (two) times daily. , Disp: , Rfl:  .  buPROPion (WELLBUTRIN XL) 150 MG 24 hr tablet, , Disp: , Rfl:  .  Cholecalciferol (VITAMIN D3) 1000 units CAPS, Take 2,000 Units by mouth 2 (two) times daily after a meal. , Disp: , Rfl:  .  CRESTOR 20 MG tablet, Take by mouth daily. , Disp: , Rfl:  .  dexamethasone (DECADRON) 4 MG tablet, TAKE AS NEEDED ONLY. TAKE 10  TABLET (40MG ) BY MOUTH DAILY FOR 4 DAYS THEN STOP, Disp: 90 tablet, Rfl: 2 .  diphenhydrAMINE (BENADRYL) 25 MG tablet, Take 25 mg by mouth every 6 (six) hours as needed (may take 1 to 2 tabs). , Disp: , Rfl:  .  famotidine (PEPCID) 40 MG tablet, Take 40 mg by mouth 2 (two) times daily. , Disp: , Rfl:  .  fluticasone (FLONASE) 50 MCG/ACT nasal spray, Place 2 sprays into both nostrils as needed. , Disp: , Rfl:  .  levETIRAcetam (KEPPRA) 500 MG tablet, 500 mg. 3 tabs  in am and 3 tabs in pm, Disp: , Rfl:  .  Multiple Vitamins-Minerals (MULTIVITAMIN WITH MINERALS) tablet, Take 1 tablet by mouth daily. , Disp: , Rfl:  .  NYSTATIN, TOPICAL, POWD, Apply 1 application topically 2 (two) times daily., Disp: 1 Bottle, Rfl: 4 .  sertraline (ZOLOFT) 100 MG tablet, Take 100 mg by mouth 2 (two) times daily. 1 tab in AM and 1/2 tab in PM=150 mg daily, Disp: , Rfl:  .  Tamsulosin HCl (FLOMAX) 0.4 MG CAPS, Take 0.8 mg by mouth daily. 2 CAP = 0.8 MG, Disp: , Rfl:  .  traMADol (ULTRAM) 50 MG tablet, TAKE ONE TABLET BY MOUTH EVERY 6 HOURS AS NEEDED FOR PAIN, Disp: 60 tablet, Rfl: 0 .  VITAMIN K  PO, Take 100 mcg by mouth daily., Disp: , Rfl:  .  warfarin (COUMADIN) 2 MG tablet, Take 2 mg by mouth daily., Disp: , Rfl:   Allergies: No Known Allergies  Past Medical History, Surgical history, Social history, and Family History were reviewed and updated.  Review of Systems:  as above   Physical Exam:  weight is 206 lb 0.6 oz (93.5 kg). His oral temperature is 97.9 F (36.6 C). His blood pressure is 130/75 and his pulse is 70. His respiration is 20.    Well-developed well-nourished white gentleman. Head and neck exam shows no ocular or oral reason. There is no adenopathy in the neck. Lungs are clear. Cardiac exam regular rate and rhythm with no murmurs, rubs or bruits. His abdomen is soft. There is no. fluid wave. There is no probable liver or spleen tip. Extremities shows no clubbing cyanosis or edema. He has chronic  weakness in his left side. Skin exam shows no rashes, ecchymosis or petechia. Neurological exam shows a weakness on his left-sided, more so in the left arm then left leg.  Lab Results  Component Value Date   WBC 9.8 02/14/2016   HGB 15.1 02/14/2016   HCT 44.0 02/14/2016   MCV 87 02/14/2016   PLT 150 02/14/2016     Chemistry      Component Value Date/Time   NA 141 01/17/2016 1424   K 4.0 01/17/2016 1424   CL 106 10/20/2015 1426   CO2 21 (L) 01/17/2016 1424   BUN 32.4 (H) 01/17/2016 1424   CREATININE 1.3 01/17/2016 1424      Component Value Date/Time   CALCIUM 9.6 01/17/2016 1424   ALKPHOS 110 01/17/2016 1424   AST 28 01/17/2016 1424   ALT 30 01/17/2016 1424   BILITOT 0.41 01/17/2016 1424         Impression and Plan: Mark [name] is 70 year old gentleman with refractory immune thrombocytopenia. He also has a history of a thrombotic CVA. He's had residual left-sided weakness. This probably was back in October of 2012.  I'm surprised that his INR still low. I want to recheck his INR in 4 or 5 days.  He certainly has healed up well from this fall. All the ecchymoses about the right eye has resolved.  Thankfully, his platelet has been doing okay. Typically, when his platelet count does go down, he takes some Decadron.  I will like to see him back in 6 weeks.   Volanda Napoleon, MD 11/2/20176:48 AM

## 2016-02-19 ENCOUNTER — Other Ambulatory Visit (HOSPITAL_BASED_OUTPATIENT_CLINIC_OR_DEPARTMENT_OTHER): Payer: Medicare Other

## 2016-02-19 DIAGNOSIS — D693 Immune thrombocytopenic purpura: Secondary | ICD-10-CM | POA: Diagnosis not present

## 2016-02-19 DIAGNOSIS — I82401 Acute embolism and thrombosis of unspecified deep veins of right lower extremity: Secondary | ICD-10-CM

## 2016-02-19 LAB — PROTIME-INR (CHCC SATELLITE)
INR: 2.8 (ref 2.0–3.5)
PROTIME: 33.6 s — AB (ref 10.6–13.4)

## 2016-02-20 ENCOUNTER — Other Ambulatory Visit: Payer: Self-pay | Admitting: *Deleted

## 2016-02-20 ENCOUNTER — Telehealth: Payer: Self-pay | Admitting: *Deleted

## 2016-02-20 DIAGNOSIS — D693 Immune thrombocytopenic purpura: Secondary | ICD-10-CM

## 2016-02-20 NOTE — Telephone Encounter (Addendum)
Patient's wife aware of results. Appointment made.  ----- Message from Volanda Napoleon, MD sent at 02/19/2016  4:48 PM EST ----- Call - protime is perfect with INR of 2.8.  We need to re-check PT/INR in 1 week!!  Mark [name]

## 2016-02-26 ENCOUNTER — Other Ambulatory Visit (HOSPITAL_BASED_OUTPATIENT_CLINIC_OR_DEPARTMENT_OTHER): Payer: Medicare Other

## 2016-02-26 DIAGNOSIS — D693 Immune thrombocytopenic purpura: Secondary | ICD-10-CM | POA: Diagnosis not present

## 2016-02-26 LAB — PROTIME-INR (CHCC SATELLITE)
INR: 3.3 (ref 2.0–3.5)
PROTIME: 39.6 s — AB (ref 10.6–13.4)

## 2016-02-27 ENCOUNTER — Telehealth: Payer: Self-pay | Admitting: *Deleted

## 2016-02-27 NOTE — Telephone Encounter (Addendum)
Patient aware of results. Appointment made.   ----- Message from Volanda Napoleon, MD sent at 02/26/2016  7:06 PM EST ----- Call - INR is ok!!!  Need to repeat the INR in 1 week.  Please set up lab appt!!  pete

## 2016-02-28 ENCOUNTER — Other Ambulatory Visit: Payer: Medicare Other

## 2016-03-01 ENCOUNTER — Other Ambulatory Visit: Payer: Self-pay | Admitting: Hematology & Oncology

## 2016-03-05 ENCOUNTER — Other Ambulatory Visit: Payer: Self-pay | Admitting: *Deleted

## 2016-03-05 DIAGNOSIS — D693 Immune thrombocytopenic purpura: Secondary | ICD-10-CM

## 2016-03-06 ENCOUNTER — Other Ambulatory Visit: Payer: Medicare Other

## 2016-03-13 ENCOUNTER — Other Ambulatory Visit (HOSPITAL_BASED_OUTPATIENT_CLINIC_OR_DEPARTMENT_OTHER): Payer: Medicare Other

## 2016-03-13 DIAGNOSIS — D693 Immune thrombocytopenic purpura: Secondary | ICD-10-CM

## 2016-03-13 LAB — PROTIME-INR (CHCC SATELLITE)
INR: 4.8 — ABNORMAL HIGH (ref 2.0–3.5)
PROTIME: 57.6 s — AB (ref 10.6–13.4)

## 2016-03-13 LAB — CBC WITH DIFFERENTIAL (CANCER CENTER ONLY)
BASO#: 0 10*3/uL (ref 0.0–0.2)
BASO%: 0.3 % (ref 0.0–2.0)
EOS ABS: 0.4 10*3/uL (ref 0.0–0.5)
EOS%: 4.3 % (ref 0.0–7.0)
HCT: 43 % (ref 38.7–49.9)
HEMOGLOBIN: 14.8 g/dL (ref 13.0–17.1)
LYMPH#: 2.9 10*3/uL (ref 0.9–3.3)
LYMPH%: 32.4 % (ref 14.0–48.0)
MCH: 29.7 pg (ref 28.0–33.4)
MCHC: 34.4 g/dL (ref 32.0–35.9)
MCV: 86 fL (ref 82–98)
MONO#: 1.8 10*3/uL — ABNORMAL HIGH (ref 0.1–0.9)
MONO%: 19.5 % — AB (ref 0.0–13.0)
NEUT%: 43.5 % (ref 40.0–80.0)
NEUTROS ABS: 3.9 10*3/uL (ref 1.5–6.5)
Platelets: 114 10*3/uL — ABNORMAL LOW (ref 145–400)
RBC: 4.98 10*6/uL (ref 4.20–5.70)
RDW: 15.2 % (ref 11.1–15.7)
WBC: 9 10*3/uL (ref 4.0–10.0)

## 2016-03-20 ENCOUNTER — Ambulatory Visit: Payer: Medicare Other | Attending: Family Medicine | Admitting: Physical Therapy

## 2016-03-20 ENCOUNTER — Other Ambulatory Visit: Payer: Medicare Other

## 2016-03-20 ENCOUNTER — Encounter: Payer: Self-pay | Admitting: Physical Therapy

## 2016-03-20 DIAGNOSIS — R296 Repeated falls: Secondary | ICD-10-CM | POA: Diagnosis present

## 2016-03-20 DIAGNOSIS — R293 Abnormal posture: Secondary | ICD-10-CM | POA: Diagnosis present

## 2016-03-20 DIAGNOSIS — M6281 Muscle weakness (generalized): Secondary | ICD-10-CM | POA: Diagnosis present

## 2016-03-20 DIAGNOSIS — R261 Paralytic gait: Secondary | ICD-10-CM | POA: Diagnosis present

## 2016-03-20 DIAGNOSIS — G8104 Flaccid hemiplegia affecting left nondominant side: Secondary | ICD-10-CM | POA: Insufficient documentation

## 2016-03-20 DIAGNOSIS — I639 Cerebral infarction, unspecified: Secondary | ICD-10-CM | POA: Diagnosis present

## 2016-03-20 DIAGNOSIS — R2681 Unsteadiness on feet: Secondary | ICD-10-CM | POA: Diagnosis present

## 2016-03-20 DIAGNOSIS — R2689 Other abnormalities of gait and mobility: Secondary | ICD-10-CM | POA: Insufficient documentation

## 2016-03-20 NOTE — Therapy (Signed)
[facility] [address] [place], [place], [address] Phone: [phone]   Fax:  [phone]  Physical Therapy Evaluation  Patient Details  Name: Mark [name] MRN: [id number] Date of Birth: [birth year] Referring Provider: Harlan Stains, MD  Encounter Date: 03/20/2016      PT End of Session - 03/20/16 2241    Visit Number 1   Number of Visits 5   Date for PT Re-Evaluation 05/17/16   Authorization Type Medicare including G-Code & progress note   PT Start Time 1015   PT Stop Time 1104   PT Time Calculation (min) 49 min   Equipment Utilized During Treatment Gait belt   Activity Tolerance Patient tolerated treatment well;Patient limited by fatigue   Behavior During Therapy [facility] for tasks assessed/performed      History reviewed. No pertinent past medical history.  History reviewed. No pertinent surgical history.  There were no vitals filed for this visit.       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 He underwent PT for 10 visits 02/06/2014 - 03/28/2015. He recieved an AFO 03/06/2014 during that episode of care. He was functioning with AFO only in community with wife supervision & in home modified independent. He fell 01/07/2016 with laceration to right forehead with glue closure and has other falls. He was referred by PCP for PT evaluation to reduce falls. Pt and wife report not wearing AFO as hurts foot & difficult to donne.     Patient is accompained by: Family member   Pertinent History sz disorder, CAD w/MI, hepatitis, DVT, ADHD, hearing loss, ITP, sleep apnea, compression fx, HTN, depression, Lupus, CVA, Stage III CKD   Limitations Lifting;Standing;Walking   Patient Stated Goals He wants to decrease falls.    Currently in Pain? No/denies            [facility] PT Assessment - 03/20/16 1015      Assessment   Medical Diagnosis Falls, old CVA   Referring  Provider Harlan Stains, MD   Onset Date/Surgical Date 02/14/16  MD visit with recurrent falls   Hand Dominance Right   Prior Therapy 10 visits 02/06/2014 - 03/28/2015     Precautions   Precautions Fall   Precaution Comments HOH wears hearing aid     Balance Screen   Has the patient fallen in the past 6 months Yes   How many times? 5-6   Has the patient had a decrease in activity level because of a fear of falling?  No  no but gets tired   Is the patient reluctant to leave their home because of a fear of falling?  No     Home Environment   Living Environment Private residence   Living Arrangements Spouse/significant other  6 cats & 2 dogs (30# & 43#)   Type of Home House   Home Access Stairs to enter   Entrance Stairs-Number of Steps 3 in front & 5 in back   Entrance Stairs-[place] One level   Additional Comments AFO     Prior Function   Level of Independence Independent;Independent with household mobility with device  supervision with community with AFO     Observation/Other Assessments   Focus on Therapeutic Outcomes (FOTO)  53.21 Functional Status  44 Risk Adjusted   Stroke Impact Scale  58.3%   Fear Avoidance Belief Questionnaire (FABQ)  41 (10)     Posture/Postural  Control   Posture/Postural Control Postural limitations   Postural Limitations Rounded Shoulders;Forward head;Flexed trunk;Weight shift right     Tone   Assessment Location Left Upper Extremity;Left Lower Extremity     ROM / Strength   AROM / PROM / Strength Strength;PROM     PROM   Overall PROM  Deficits   PROM Assessment Site Ankle   Right/Left Ankle Left   Left Ankle Dorsiflexion -12   Left Ankle Eversion -3     Strength   Overall Strength Deficits   Strength Assessment Site Hip;Knee;Ankle   Right/Left Hip Left   Left Hip Flexion 3-/5   Left Hip Extension 2/5   Left Hip External Rotation 2-/5   Left Hip Internal Rotation 2-/5   Left Hip ABduction 3-/5    Right/Left Knee Left   Left Knee Flexion 3-/5   Left Knee Extension 3-/5   Right/Left Ankle Left   Left Ankle Dorsiflexion 2-/5   Left Ankle Plantar Flexion 2-/5     Transfers   Transfers Sit to Stand;Stand to Sit   Sit to Stand 5: Supervision;With upper extremity assist;From chair/3-in-1   Stand to Sit 5: Supervision;With upper extremity assist;To chair/3-in-1     Ambulation/Gait   Ambulation/Gait Yes   Ambulation/Gait Assistance 5: Supervision   Ambulation Distance (Feet) 100 Feet   Assistive device None;Other (Comment)  AFO LLE   Gait Pattern Step-through pattern;Decreased arm swing - left;Decreased step length - right;Decreased stance time - left;Decreased stride length;Decreased hip/knee flexion - left;Decreased dorsiflexion - left;Left hip hike;Left steppage;Left flexed knee in stance;Trunk flexed;Poor foot clearance - left   Ambulation Surface Indoor;Level   Gait velocity 2.74 ft/sec     Balance   Balance Assessed Yes     Dynamic Standing Balance   Dynamic Standing - Balance Support No upper extremity supported;During functional activity   Dynamic Standing - Level of Assistance 4: Min assist;3: Mod assist   Dynamic Standing - Comments step strategy forward, backward, right & left: delayed & only steps with RLE.      Standardized Balance Assessment   Standardized Balance Assessment Berg Balance Test;Timed Up and Go Test     Berg Balance Test   Sit to Stand Able to stand  independently using hands   Standing Unsupported Able to stand 2 minutes with supervision   Sitting with Back Unsupported but Feet Supported on Floor or Stool Able to sit safely and securely 2 minutes   Stand to Sit Controls descent by using hands   Transfers Able to transfer safely, definite need of hands   Standing Unsupported with Eyes Closed Able to stand 3 seconds   Standing Ubsupported with Feet Together Able to place feet together independently but unable to hold for 30 seconds   From Standing,  Reach Forward with Outstretched Arm Can reach forward >5 cm safely (2")   From Standing Position, Pick up Object from Floor Able to pick up shoe, needs supervision   From Standing Position, Turn to Look Behind Over each Shoulder Needs supervision when turning   Turn 360 Degrees Needs close supervision or verbal cueing   Standing Unsupported, Alternately Place Feet on Step/Stool Able to complete >2 steps/needs minimal assist   Standing Unsupported, One Foot in Front Able to take small step independently and hold 30 seconds   Standing on One Leg Tries to lift leg/unable to hold 3 seconds but remains standing independently   Total Score 31     Timed Up and Go Test  Normal TUG (seconds) 13.98  13.98sec turn clockwise (perferred) & 14.85sec counterclockw     LUE Tone   LUE Tone Flaccid     LLE Tone   LLE Tone Hypertonic     LLE Tone   Hypertonic Details synergist movements                           PT Education - 03/29/16 1015    Education provided Yes   Education Details proper AFO donning including removing standard shoe insert as he has a custom insert   Person(s) Educated Patient;Spouse   Methods Explanation;Verbal cues;Demonstration   Comprehension Verbalized understanding;Verbal cues required;Need further instruction             PT Long Term Goals - 03/29/16 07-22-2252      PT LONG TERM GOAL #1   Title Patient and wife demonstrate and verbalize understanding of ongoing fitness plan.  (Target Date: 05/17/2016)   Time 2   Period Months   Status New     PT LONG TERM GOAL #2   Title Patient ambulates 300' on outside surfaces including grass with supervision with AFO & assistive device.  (Target Date: 05/17/2016)   Time 2   Period Months   Status New     PT LONG TERM GOAL #3   Title Patient negotiates ramps, curbs and stairs (1 rail) with AFO & assistive device with supervision.   (Target Date: 05/17/2016)   Time 2   Period Months   Status New                Plan - 29-Mar-2016 07/23/2242    Clinical Impression Statement This 70yo male had CVA 07-23-10 with left hemiplegia. Patient recieved PT 02/06/2014-03/28/2015 & recieved an AFO to improve safety. He had fall 01/07/2015 with laceration to head. He has limited AFO wear since fall. His gait and balance have declined over last few months. Timed Up & Go and Berg Balance both indicate high fall risk. Step strategy for balance loss is delayed with RLE & absent with LLE. Patient needs assessment of gait with canes, arm sling including barriers. He also would benefit from updated HEP with skilled PT.    Rehab Potential Good   PT Frequency 1x / week  4 visits over 2 months   PT Duration 8 weeks   PT Treatment/Interventions ADLs/Self Care Home Management;DME Instruction;Gait training;Stair training;Functional mobility training;Therapeutic activities;Therapeutic exercise;Balance training;Neuromuscular re-education;Patient/family education;Orthotic Fit/Training   PT Next Visit Plan gait with various cane designs & check current HEP   Consulted and Agree with Plan of Care Patient;Family member/caregiver   Family Member Consulted wife      Patient will benefit from skilled therapeutic intervention in order to improve the following deficits and impairments:  Abnormal gait, Decreased activity tolerance, Decreased balance, Decreased knowledge of precautions, Decreased knowledge of use of DME, Decreased mobility, Decreased strength, Impaired tone, Impaired flexibility, Postural dysfunction, Other (comment) (orthotic dependency)  Visit Diagnosis: Other abnormalities of gait and mobility  Paralytic gait  Repeated falls  Unsteadiness on feet  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)  Abnormal posture  Muscle weakness (generalized)      G-Codes - 29-Mar-2016 07/22/2299    Functional Assessment Tool Used Timed Up-Go 13.98sec clockwise turn & 14.85sec counterclockwise turn    Functional Limitation Mobility: Walking and moving around   Mobility: Walking and Moving Around Current Status [id number]) At least 60 percent but  less than 80 percent impaired, limited or restricted   Mobility: Walking and Moving Around Goal Status [phone]) At least 40 percent but less than 60 percent impaired, limited or restricted       Problem List Patient Active Problem List   Diagnosis Date Noted  . Deep vein thrombosis (DVT) ([place]) 03/02/2015  . Lupus anticoagulant disorder ([place]) 03/02/2015  . Ischemic stroke ([place]) 10/04/2014  . 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 Deep vein thrombosis ([place]) 10/04/2014  . 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 LA (lupus anticoagulant) disorder ([place]) 10/04/2014  . Arteriosclerosis of coronary artery 09/10/2012  . Apnea, sleep 09/10/2012  . 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 Dermatophytic onychia 01/29/2012  . Spastic hemiplegia ([place]) 12/03/2011  . Hemiparesis, left ([place]) 10/31/2011  . Dysphonia 08/13/2011  . Idiopathic thrombocytopenic purpura ([place]) 08/12/2011  . ITP (idiopathic thrombocytopenic purpura) 08/12/2011    Jamey Reas PT, DPT 03/20/2016, 11:07 PM  [facility] [address] [place], [place], [address] Phone: [phone]   Fax:  [phone]  Name: MALACAI OLM MRN: [id number] Date of Birth: Jul 06, 1945

## 2016-03-26 ENCOUNTER — Ambulatory Visit: Payer: Medicare Other | Admitting: Physical Therapy

## 2016-03-28 ENCOUNTER — Other Ambulatory Visit (HOSPITAL_BASED_OUTPATIENT_CLINIC_OR_DEPARTMENT_OTHER): Payer: Medicare Other

## 2016-03-28 DIAGNOSIS — D693 Immune thrombocytopenic purpura: Secondary | ICD-10-CM

## 2016-03-28 DIAGNOSIS — I69359 Hemiplegia and hemiparesis following cerebral infarction affecting unspecified side: Secondary | ICD-10-CM

## 2016-03-28 DIAGNOSIS — Z8673 Personal history of transient ischemic attack (TIA), and cerebral infarction without residual deficits: Secondary | ICD-10-CM

## 2016-03-28 LAB — CBC WITH DIFFERENTIAL (CANCER CENTER ONLY)
BASO#: 0 10*3/uL (ref 0.0–0.2)
BASO%: 0.4 % (ref 0.0–2.0)
EOS ABS: 0.4 10*3/uL (ref 0.0–0.5)
EOS%: 3.8 % (ref 0.0–7.0)
HEMATOCRIT: 43.3 % (ref 38.7–49.9)
HEMOGLOBIN: 14.7 g/dL (ref 13.0–17.1)
LYMPH#: 2.5 10*3/uL (ref 0.9–3.3)
LYMPH%: 23.8 % (ref 14.0–48.0)
MCH: 29.8 pg (ref 28.0–33.4)
MCHC: 33.9 g/dL (ref 32.0–35.9)
MCV: 88 fL (ref 82–98)
MONO#: 2.3 10*3/uL — AB (ref 0.1–0.9)
MONO%: 21.5 % — ABNORMAL HIGH (ref 0.0–13.0)
NEUT#: 5.4 10*3/uL (ref 1.5–6.5)
NEUT%: 50.5 % (ref 40.0–80.0)
Platelets: 187 10*3/uL (ref 145–400)
RBC: 4.94 10*6/uL (ref 4.20–5.70)
RDW: 15.4 % (ref 11.1–15.7)
WBC: 10.6 10*3/uL — AB (ref 4.0–10.0)

## 2016-03-28 LAB — PROTIME-INR (CHCC SATELLITE)
INR: 3.7 — ABNORMAL HIGH (ref 2.0–3.5)
Protime: 44.4 Seconds — ABNORMAL HIGH (ref 10.6–13.4)

## 2016-03-28 LAB — TECHNOLOGIST REVIEW CHCC SATELLITE

## 2016-04-02 ENCOUNTER — Encounter: Payer: Self-pay | Admitting: Physical Therapy

## 2016-04-02 ENCOUNTER — Ambulatory Visit: Payer: Medicare Other | Admitting: Physical Therapy

## 2016-04-02 DIAGNOSIS — R296 Repeated falls: Secondary | ICD-10-CM

## 2016-04-02 DIAGNOSIS — R2689 Other abnormalities of gait and mobility: Secondary | ICD-10-CM

## 2016-04-02 DIAGNOSIS — R2681 Unsteadiness on feet: Secondary | ICD-10-CM

## 2016-04-03 ENCOUNTER — Other Ambulatory Visit: Payer: Medicare Other

## 2016-04-03 NOTE — Therapy (Signed)
[facility] [address] [place], [place], [address] Phone: [phone]   Fax:  [phone]  Physical Therapy Treatment  Patient Details  Name: Mark [name] MRN: [id number] Date of Birth: [birth year] Referring Provider: Harlan Stains, MD  Encounter Date: 04/02/2016      PT End of Session - 04/02/16 1615    Visit Number 2   Number of Visits 5   Date for PT Re-Evaluation 05/17/16   Authorization Type Medicare including G-Code & progress note   PT Start Time [id number]   PT Stop Time 1615   PT Time Calculation (min) 45 min   Equipment Utilized During Treatment Gait belt   Activity Tolerance Patient tolerated treatment well;Patient limited by fatigue   Behavior During Therapy [facility] for tasks assessed/performed      History reviewed. No pertinent past medical history.  History reviewed. No pertinent surgical history.  There were no vitals filed for this visit.      Subjective Assessment - 04/02/16 1535    Subjective He fell getting out of shower slipped on wet floor and hit ribs on toilet. He has been wearing AFO daily since eval.. He puts AFO on himself and wife checks.    Patient is accompained by: Family member   Pertinent History sz disorder, CAD w/MI, hepatitis, DVT, ADHD, hearing loss, ITP, sleep apnea, compression fx, HTN, depression, Lupus, CVA, Stage III CKD   Limitations Lifting;Standing;Walking   How long can you sit comfortably?     Patient Stated Goals He wants to decrease falls.    Currently in Pain? No/denies     Self-Care: PT instructed with demo positioning of LUE to decrease subluxation with hemiplegia including use of Yoga block in sitting and GivMohr for standing /gait for longer periods like when he goes out of home. Pt arrived with GivMohr properly donned with wife's assistance.  Pt to write list of current exercises so PT can update & modify appropriately.  PT demo, instructed in proper donning  of AFO tightening straps distally to proximally. Pt & wife verbalized understanding.   Gait Training with AFO: PT demo, instructed pt & wife in differences in std straight cane (SPC) with offset handle, Hurrycane, Straight cane with rubber quad tip, SBQC & LBQC. PT tested gait with each design negotiating around an obstacle & TUG. TUG times were: SPC 15.75sec,  Hurrycane 14.02sec,  SPC quad tip 14.42sec, SBQC 13.05sec and LBQC 14.99sec. Gait pattern appeared safest with SPC with quad tip.                             PT Education - 04/02/16 1530    Education provided Yes   Education Details cane design differences with stability & fluency of gait, GivMohr use with gait outside home, reviewed proper donning of AFO   Person(s) Educated Patient;Spouse   Methods Explanation;Demonstration;Tactile cues;Verbal cues   Comprehension Verbalized understanding;Returned demonstration;Verbal cues required;Tactile cues required;Need further instruction             PT Long Term Goals - 04/02/16 1615      PT LONG TERM GOAL #1   Title Patient and wife demonstrate and verbalize understanding of ongoing fitness plan.  (Target Date: 05/17/2016)   Time 2   Period Months   Status On-going     PT LONG TERM GOAL #2   Title Patient ambulates 300' on outside surfaces including grass with supervision with AFO & assistive  device.  (Target Date: 05/17/2016)   Time 2   Period Months   Status On-going     PT LONG TERM GOAL #3   Title Patient negotiates ramps, curbs and stairs (1 rail) with AFO & assistive device with supervision.   (Target Date: 05/17/2016)   Time 2   Period Months   Status On-going               Plan - 04/02/16 1615    Clinical Impression Statement Patient appears to have safer gait with straight cane with quad tip. Pt & wife verbalize understanding of recommendation & where to purchase one. They appear to understand positioning & use of GivMohr for LUE support to  decrease subluxation & impact on balance of arm hanging.    Rehab Potential Good   PT Frequency 1x / week  4 visits over 2 months   PT Duration 8 weeks   PT Treatment/Interventions ADLs/Self Care Home Management;DME Instruction;Gait training;Stair training;Functional mobility training;Therapeutic activities;Therapeutic exercise;Balance training;Neuromuscular re-education;Patient/family education;Orthotic Fit/Training   PT Next Visit Plan check current HEP, gait training with AFO & straight cane with quad tip including barriers.   Consulted and Agree with Plan of Care Patient;Family member/caregiver   Family Member Consulted wife      Patient will benefit from skilled therapeutic intervention in order to improve the following deficits and impairments:  Abnormal gait, Decreased activity tolerance, Decreased balance, Decreased knowledge of precautions, Decreased knowledge of use of DME, Decreased mobility, Decreased strength, Impaired tone, Impaired flexibility, Postural dysfunction, Other (comment) (orthotic dependency)  Visit Diagnosis: Other abnormalities of gait and mobility  Repeated falls  Unsteadiness on feet     Problem List Patient Active Problem List   Diagnosis Date Noted  . Deep vein thrombosis (DVT) ([place]) 03/02/2015  . Lupus anticoagulant disorder ([place]) 03/02/2015  . Ischemic stroke ([place]) 10/04/2014  . 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 Deep vein thrombosis ([place]) 10/04/2014  . 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 LA (lupus anticoagulant) disorder ([place]) 10/04/2014  . Arteriosclerosis of coronary artery 09/10/2012  . Apnea, sleep 09/10/2012  . 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 Dermatophytic onychia 01/29/2012  . Spastic hemiplegia ([place]) 12/03/2011  . Hemiparesis, left ([place]) 10/31/2011  . Dysphonia 08/13/2011  . Idiopathic thrombocytopenic purpura ([place]) 08/12/2011  . ITP (idiopathic thrombocytopenic purpura) 08/12/2011    Jamey Reas PT,  DPT 04/03/2016, 1:47 PM  [facility] [address] [place], [place], [address] Phone: [phone]   Fax:  [phone]  Name: Mark [name] MRN: [id number] Date of Birth: [birth year]

## 2016-04-04 ENCOUNTER — Other Ambulatory Visit (HOSPITAL_BASED_OUTPATIENT_CLINIC_OR_DEPARTMENT_OTHER): Payer: Medicare Other

## 2016-04-04 DIAGNOSIS — D693 Immune thrombocytopenic purpura: Secondary | ICD-10-CM

## 2016-04-04 LAB — PROTIME-INR (CHCC SATELLITE)
INR: 4 — ABNORMAL HIGH (ref 2.0–3.5)
Protime: 48 Seconds — ABNORMAL HIGH (ref 10.6–13.4)

## 2016-04-09 ENCOUNTER — Ambulatory Visit: Payer: Medicare Other | Admitting: Physical Therapy

## 2016-04-10 ENCOUNTER — Other Ambulatory Visit: Payer: Medicare Other

## 2016-04-16 ENCOUNTER — Ambulatory Visit: Payer: Medicare HMO | Attending: Family Medicine | Admitting: Physical Therapy

## 2016-04-16 DIAGNOSIS — I639 Cerebral infarction, unspecified: Secondary | ICD-10-CM | POA: Insufficient documentation

## 2016-04-16 DIAGNOSIS — R293 Abnormal posture: Secondary | ICD-10-CM | POA: Insufficient documentation

## 2016-04-16 DIAGNOSIS — R296 Repeated falls: Secondary | ICD-10-CM | POA: Insufficient documentation

## 2016-04-16 DIAGNOSIS — R2681 Unsteadiness on feet: Secondary | ICD-10-CM | POA: Insufficient documentation

## 2016-04-16 DIAGNOSIS — M6281 Muscle weakness (generalized): Secondary | ICD-10-CM | POA: Insufficient documentation

## 2016-04-16 DIAGNOSIS — R2689 Other abnormalities of gait and mobility: Secondary | ICD-10-CM | POA: Insufficient documentation

## 2016-04-16 DIAGNOSIS — G8104 Flaccid hemiplegia affecting left nondominant side: Secondary | ICD-10-CM | POA: Insufficient documentation

## 2016-04-16 DIAGNOSIS — R261 Paralytic gait: Secondary | ICD-10-CM | POA: Insufficient documentation

## 2016-04-23 ENCOUNTER — Ambulatory Visit: Payer: Medicare HMO | Admitting: Physical Therapy

## 2016-04-24 ENCOUNTER — Other Ambulatory Visit: Payer: Medicare Other

## 2016-04-24 ENCOUNTER — Ambulatory Visit: Payer: Medicare Other | Admitting: Hematology & Oncology

## 2016-04-26 ENCOUNTER — Ambulatory Visit: Payer: Medicare Other | Admitting: Family

## 2016-04-26 ENCOUNTER — Other Ambulatory Visit: Payer: Medicare Other

## 2016-04-29 ENCOUNTER — Ambulatory Visit: Payer: Medicare HMO | Admitting: Physical Therapy

## 2016-05-01 ENCOUNTER — Other Ambulatory Visit: Payer: Medicare Other

## 2016-05-03 ENCOUNTER — Other Ambulatory Visit: Payer: Medicare HMO

## 2016-05-03 ENCOUNTER — Telehealth: Payer: Self-pay | Admitting: Hematology & Oncology

## 2016-05-03 ENCOUNTER — Ambulatory Visit: Payer: Medicare HMO | Admitting: Family

## 2016-05-03 NOTE — Telephone Encounter (Signed)
Patient's wife called and cx 05/03/16 apt and resch for 05/09/16.  Rn was notifed

## 2016-05-06 ENCOUNTER — Other Ambulatory Visit: Payer: Self-pay | Admitting: Hematology & Oncology

## 2016-05-06 DIAGNOSIS — E785 Hyperlipidemia, unspecified: Secondary | ICD-10-CM | POA: Diagnosis not present

## 2016-05-06 DIAGNOSIS — I10 Essential (primary) hypertension: Secondary | ICD-10-CM | POA: Diagnosis not present

## 2016-05-06 DIAGNOSIS — R69 Illness, unspecified: Secondary | ICD-10-CM | POA: Diagnosis not present

## 2016-05-06 DIAGNOSIS — I69359 Hemiplegia and hemiparesis following cerebral infarction affecting unspecified side: Secondary | ICD-10-CM | POA: Diagnosis not present

## 2016-05-08 ENCOUNTER — Ambulatory Visit: Payer: Medicare HMO | Admitting: Physical Therapy

## 2016-05-08 ENCOUNTER — Encounter: Payer: Self-pay | Admitting: Physical Therapy

## 2016-05-08 DIAGNOSIS — I639 Cerebral infarction, unspecified: Secondary | ICD-10-CM | POA: Diagnosis not present

## 2016-05-08 DIAGNOSIS — M6281 Muscle weakness (generalized): Secondary | ICD-10-CM | POA: Diagnosis not present

## 2016-05-08 DIAGNOSIS — R296 Repeated falls: Secondary | ICD-10-CM

## 2016-05-08 DIAGNOSIS — R2681 Unsteadiness on feet: Secondary | ICD-10-CM | POA: Diagnosis not present

## 2016-05-08 DIAGNOSIS — R261 Paralytic gait: Secondary | ICD-10-CM

## 2016-05-08 DIAGNOSIS — R2689 Other abnormalities of gait and mobility: Secondary | ICD-10-CM

## 2016-05-08 DIAGNOSIS — G8104 Flaccid hemiplegia affecting left nondominant side: Secondary | ICD-10-CM | POA: Diagnosis not present

## 2016-05-08 DIAGNOSIS — R293 Abnormal posture: Secondary | ICD-10-CM | POA: Diagnosis not present

## 2016-05-08 NOTE — Patient Instructions (Addendum)
Knee to Chest    From sitting position, cup both hands below knee and gently pull it toward the chest. Exhale as knee comes up. Hold position ____ seconds. Repeat using other knee. Repeat ____ times. Do ____ sessions per day.  http://gt2.exer.us/326   Copyright  VHI. All rights reserved.  Chair Sitting    Sit at edge of seat, spine straight, one leg extended. Put a hand on each thigh and bend forward from the hip, keeping spine straight. Allow hand on extended leg to reach toward toes. Support upper body with other arm. Hold ___ seconds. Repeat ___ times per session. Do ___ sessions per day.  Copyright  VHI. All rights reserved.  Gastroc, Sitting (Passive)    Sit with strap or towel around ball of foot. Gently pull toward body. Hold ___ seconds.  Repeat ___ times per session. Do ___ sessions per day.  Copyright  VHI. All rights reserved.  Squat: Double Leg (Supported)    With feet shoulder width apart, hold support. Squat, keeping lower leg vertical, knee in line with second toe. Use legs, do not pull up and down with arms. Repeat ___ times per set. Rest ___ seconds after set. Do ___ sets per session.  http://plyo.exer.us/74   Copyright  VHI. All rights reserved.  Bear Roots on One Leg - Modified    Stand in Horse Stance, elbows bent, palms lightly resting on back of chair. Shift weight onto one foot. Lift unweighted foot. Variation: Lift heel only. Repeat ___ times each side.  Copyright  VHI. All rights reserved.

## 2016-05-09 ENCOUNTER — Encounter: Payer: Self-pay | Admitting: Family

## 2016-05-09 ENCOUNTER — Ambulatory Visit (HOSPITAL_BASED_OUTPATIENT_CLINIC_OR_DEPARTMENT_OTHER): Payer: Medicare HMO | Admitting: Family

## 2016-05-09 ENCOUNTER — Other Ambulatory Visit (HOSPITAL_BASED_OUTPATIENT_CLINIC_OR_DEPARTMENT_OTHER): Payer: Medicare HMO

## 2016-05-09 VITALS — BP 103/68 | HR 68 | Temp 98.3°F | Resp 16 | Ht 69.0 in | Wt 198.0 lb

## 2016-05-09 DIAGNOSIS — I69359 Hemiplegia and hemiparesis following cerebral infarction affecting unspecified side: Secondary | ICD-10-CM

## 2016-05-09 DIAGNOSIS — Z8673 Personal history of transient ischemic attack (TIA), and cerebral infarction without residual deficits: Secondary | ICD-10-CM

## 2016-05-09 DIAGNOSIS — D693 Immune thrombocytopenic purpura: Secondary | ICD-10-CM

## 2016-05-09 DIAGNOSIS — Z7901 Long term (current) use of anticoagulants: Secondary | ICD-10-CM | POA: Diagnosis not present

## 2016-05-09 DIAGNOSIS — D6862 Lupus anticoagulant syndrome: Secondary | ICD-10-CM | POA: Diagnosis not present

## 2016-05-09 LAB — COMPREHENSIVE METABOLIC PANEL
ALBUMIN: 4.5 g/dL (ref 3.5–5.0)
ALK PHOS: 103 U/L (ref 40–150)
ALT: 64 U/L — AB (ref 0–55)
AST: 39 U/L — AB (ref 5–34)
Anion Gap: 11 mEq/L (ref 3–11)
BUN: 36.4 mg/dL — AB (ref 7.0–26.0)
CALCIUM: 9.9 mg/dL (ref 8.4–10.4)
CHLORIDE: 106 meq/L (ref 98–109)
CO2: 22 mEq/L (ref 22–29)
CREATININE: 1.5 mg/dL — AB (ref 0.7–1.3)
EGFR: 48 mL/min/{1.73_m2} — ABNORMAL LOW (ref >90)
Glucose: 94 mg/dl (ref 70–140)
POTASSIUM: 4.3 meq/L (ref 3.5–5.1)
SODIUM: 138 meq/L (ref 136–145)
Total Bilirubin: 0.49 mg/dL (ref 0.20–1.20)
Total Protein: 8.2 g/dL (ref 6.4–8.3)

## 2016-05-09 LAB — CBC WITH DIFFERENTIAL (CANCER CENTER ONLY)
BASO#: 0 10*3/uL (ref 0.0–0.2)
BASO%: 0.4 % (ref 0.0–2.0)
EOS ABS: 0.4 10*3/uL (ref 0.0–0.5)
EOS%: 4.1 % (ref 0.0–7.0)
HCT: 44.5 % (ref 38.7–49.9)
HGB: 15.2 g/dL (ref 13.0–17.1)
LYMPH#: 2.7 10*3/uL (ref 0.9–3.3)
LYMPH%: 31.6 % (ref 14.0–48.0)
MCH: 29.7 pg (ref 28.0–33.4)
MCHC: 34.2 g/dL (ref 32.0–35.9)
MCV: 87 fL (ref 82–98)
MONO#: 1.4 10*3/uL — AB (ref 0.1–0.9)
MONO%: 17 % — AB (ref 0.0–13.0)
NEUT#: 4 10*3/uL (ref 1.5–6.5)
NEUT%: 46.9 % (ref 40.0–80.0)
PLATELETS: 187 10*3/uL (ref 145–400)
RBC: 5.11 10*6/uL (ref 4.20–5.70)
RDW: 15.4 % (ref 11.1–15.7)
WBC: 8.5 10*3/uL (ref 4.0–10.0)

## 2016-05-09 LAB — PROTIME-INR (CHCC SATELLITE)
INR: 2.9 (ref 2.0–3.5)
PROTIME: 34.8 s — AB (ref 10.6–13.4)

## 2016-05-09 NOTE — Progress Notes (Signed)
Hematology and Oncology Follow Up Visit  Mark [name] [id number] 05-Apr-1946 71 y.o. 05/09/2016   Principle Diagnosis:  1. Refractory immune thrombocytopenia. 2. Positive lupus anticoagulant. 3. 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 Coumadin to maintain INR between(2-2-4 schedule)  2. Decadron - the patient to take at his discretion    Interim History:  Mark [name] is here today for a follow-up. He is doing fairly well. Unfortunately, he and his wife have to have their sweet cocker spaniel put to sleep tomorrow. They are tearful talking about it but are thankful for the time they had with him.   He is receiving PT through neuro to help with balance and is doing well. He is using a cane when ambulating now which is helpful. He has had no recent falls or syncopal episodes.  He is doing well on coumadin. His INR is therapeutic at 2.9 so he will continue on his same dose. He continue to take his vitamin K 100 mg PO daily.  No episodes of bleeding, bruising or petechiae. No fever, chills, n/v, cough, rash, dizziness, SOB, chest pain, palpitations, abdominal pain or changes in bowel or bladder habits. No lymphadenopathy found on exam. 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 He wears a brace to help with support on the left foot.  He has maintained a good appetite and is staying well hydrated. His weight is stable.   Medications:  Allergies as of 05/09/2016   No Known Allergies     Medication List       Accurate as of 05/09/16  1:37 PM. Always use your most recent med list.          alendronate 70 MG tablet Commonly known as:  FOSAMAX Take 70 mg by mouth every 7 (seven) days. Take with a full glass of water on an empty stomach.   amantadine 100 MG capsule Commonly known as:  SYMMETREL   amLODipine 2.5 MG tablet Commonly known as:  NORVASC   BENADRYL 25 MG tablet Generic drug:  diphenhydrAMINE Take 25 mg by mouth  every 6 (six) hours as needed (may take 1 to 2 tabs).   bethanechol 25 MG tablet Commonly known as:  URECHOLINE Take 25 mg by mouth 2 (two) times daily.   buPROPion 150 MG 24 hr tablet Commonly known as:  WELLBUTRIN XL   CRESTOR 20 MG tablet Generic drug:  rosuvastatin Take by mouth daily.   dexamethasone 4 MG tablet Commonly known as:  DECADRON TAKE AS NEEDED ONLY. TAKE 10 TABLET (40MG ) BY MOUTH DAILY FOR 4 DAYS THEN STOP   famotidine 40 MG tablet Commonly known as:  PEPCID Take 40 mg by mouth 2 (two) times daily.   fluticasone 50 MCG/ACT nasal spray Commonly known as:  FLONASE Place 2 sprays into both nostrils as needed.   levETIRAcetam 500 MG tablet Commonly known as:  KEPPRA 500 mg. 3 tabs  in am and 3 tabs in pm   multivitamin with minerals tablet Take 1 tablet by mouth daily.   NYSTATIN (TOPICAL) Powd Apply 1 application topically 2 (two) times daily.   sertraline 100 MG tablet Commonly known as:  ZOLOFT Take 100 mg by mouth 2 (two) times daily. 1 tab in AM and 1/2 tab in PM=150 mg daily   tamsulosin 0.4 MG Caps capsule Commonly known as:  FLOMAX Take 0.8 mg by mouth daily. 2 CAP = 0.8 MG   traMADol 50 MG tablet Commonly known  as:  ULTRAM TAKE ONE TABLET BY MOUTH EVERY 6 HOURS AS NEEDED FOR PAIN   TYLENOL 500 MG tablet Generic drug:  acetaminophen Take 1,000 mg by mouth every 8 (eight) hours as needed.   Vitamin D3 1000 units Caps Take 2,000 Units by mouth 2 (two) times daily after a meal.   VITAMIN K PO Take 100 mcg by mouth daily.   warfarin 2 MG tablet Commonly known as:  COUMADIN Take 2 mg by mouth daily.   warfarin 2 MG tablet Commonly known as:  COUMADIN TAKE 2 TABLETS BY MOUTH ON THE FIRST DAY, THEN TAKE 1 TABLET THE SECOND DAY, REPEAT THIS PATTERN UNTIL DOCTOR CHANGES       Allergies: No Known Allergies  Past Medical History, Surgical history, Social history, and Family History were reviewed and updated.  Review of Systems: All  other 10 point review of systems is negative.   Physical Exam:  vitals were not taken for this visit.  Wt Readings from Last 3 Encounters:  02/14/16 206 lb 0.6 oz (93.5 kg)  12/27/15 204 lb (92.5 kg)  10/20/15 202 lb (91.6 kg)    Ocular: Sclerae unicteric, pupils equal, round and reactive to light Ear-nose-throat: Oropharynx clear, dentition fair Lymphatic: No cervical supraclavicular or axillary adenopathy Lungs no rales or rhonchi, good excursion bilaterally Heart regular rate and rhythm, no murmur appreciated Abd soft, nontender, positive bowel sounds, no liver or spleen tip palpated on exam, no fluid wave MSK no focal spinal tenderness, no joint edema Neuro: non-focal, well-oriented, appropriate affect Breasts: Deferred  Lab Results  Component Value Date   WBC 8.5 05/09/2016   HGB 15.2 05/09/2016   HCT 44.5 05/09/2016   MCV 87 05/09/2016   PLT 187 05/09/2016   Lab Results  Component Value Date   FERRITIN 62 08/20/2010   Lab Results  Component Value Date   RETICCTPCT 1.5 08/20/2010   RBC 5.11 05/09/2016   RETICCTABS 75.3 08/20/2010   No results found for: KPAFRELGTCHN, LAMBDASER, KAPLAMBRATIO No results found for: IGGSERUM, IGA, IGMSERUM No results found for: Mark [name], SPEI   Chemistry      Component Value Date/Time   NA 141 01/17/2016 1424   K 4.0 01/17/2016 1424   CL 106 10/20/2015 1426   CO2 21 (L) 01/17/2016 1424   BUN 32.4 (H) 01/17/2016 1424   CREATININE 1.3 01/17/2016 1424      Component Value Date/Time   CALCIUM 9.6 01/17/2016 1424   ALKPHOS 110 01/17/2016 1424   AST 28 01/17/2016 1424   ALT 30 01/17/2016 1424   BILITOT 0.41 01/17/2016 1424     Impression and Plan: Mark [name] is 71 year old gentleman with refractory immune thrombocytopenia. 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 He is doing well and enjoying going to PT. His  balance is improving.  He is theraputic at this time with an INR of 2.9. He will continue on his current Coumadin regimen.  We will continue checking his labs every 3 weeks and then plan to see him back for a follow-up in 1 months.  Both he and his wonderful wife know to contact us with any other questions or concerns. We can certainly see him sooner if need be.   Eliezer Bottom, NP 1/25/20181:37 PM

## 2016-05-09 NOTE — Therapy (Signed)
[facility] [address] [place], [place], [address] Phone: [phone]   Fax:  [phone]  Physical Therapy Treatment  Patient Details  Name: Mark [name] MRN: [id number] Date of Birth: 04-21-1945 Referring Provider: Harlan Stains, MD  Encounter Date: 05/08/2016      PT End of Session - 05/08/16 1459    Visit Number 3   Number of Visits 5   Date for PT Re-Evaluation 05/17/16   Authorization Type Medicare including G-Code & progress note   PT Start Time 1400   PT Stop Time 1453   PT Time Calculation (min) 53 min   Equipment Utilized During Treatment Gait belt   Activity Tolerance Patient tolerated treatment well;Patient limited by fatigue   Behavior During Therapy [facility] for tasks assessed/performed      History reviewed. No pertinent past medical history.  History reviewed. No pertinent surgical history.  There were no vitals filed for this visit.      Subjective Assessment - 05/08/16 1404    Subjective He slid off bed but no injuries.    Patient is accompained by: Family member   Pertinent History sz disorder, CAD w/MI, hepatitis, DVT, ADHD, hearing loss, ITP, sleep apnea, compression fx, HTN, depression, Lupus, CVA, Stage III CKD   Limitations Lifting;Standing;Walking   Patient Stated Goals He wants to decrease falls.    Currently in Pain? No/denies                              Balance Exercises - 05/08/16 1400      OTAGO PROGRAM   Head Movements 5 reps;Sitting   Neck Movements 5 reps;Sitting   Back Extension 5 reps;Standing  RUE support   Trunk Movements 5 reps;Standing  RUE support   Ankle Movements Sitting;10 reps  limited AROM Left ankle   Knee Extensor 10 reps   Knee Flexor 10 reps  RUE support   Hip ABductor 10 reps  RUE support   Ankle Plantorflexors 20 reps, support   Ankle Dorsiflexors 20 reps, support   Knee Bends 10 reps, support  chair posterior for  safety   Backwards Walking Support  counter support RUE   Walking and Turning Around Assistive device  cane   Sideways Walking Assistive device  counter support RUE   Tandem Stance 10 seconds, no support   Tandem Walk Support  counter support RUE   One Leg Stand 10 seconds, no support  modified balancing with forefoot in lower cabinet   Heel Walking Support  counter RUE   Toe Walk Support  counter RUE   Heel Toe Walking Backward --  counter RUE   Sit to Stand 10 reps, no support   Stair Walking rail support           PT Education - 05/08/16 1400    Education provided Yes   Education Details OTAGO HEP, hamstring & heelcord stretches    Person(s) Educated Patient;Spouse   Methods Explanation;Demonstration;Tactile cues;Verbal cues;Handout   Comprehension Verbalized understanding;Returned demonstration;Verbal cues required;Tactile cues required;Need further instruction             PT Long Term Goals - 04/02/16 1615      PT LONG TERM GOAL #1   Title Patient and wife demonstrate and verbalize understanding of ongoing fitness plan.  (Target Date: 05/17/2016)   Time 2   Period Months   Status On-going     PT LONG TERM GOAL #  2   Title Patient ambulates 300' on outside surfaces including grass with supervision with AFO & assistive device.  (Target Date: 05/17/2016)   Time 2   Period Months   Status On-going     PT LONG TERM GOAL #3   Title Patient negotiates ramps, curbs and stairs (1 rail) with AFO & assistive device with supervision.   (Target Date: 05/17/2016)   Time 2   Period Months   Status On-going               Plan - 05/08/16 1601    Clinical Impression Statement Patient appears to understand OTAGO HEP with modifications for single leg stance by stepping into counter. Patient needs new straps for AFO, so PT contacted orthotic company.    Rehab Potential Good   PT Frequency 1x / week  4 visits over 2 months   PT Duration 8 weeks   PT  Treatment/Interventions ADLs/Self Care Home Management;DME Instruction;Gait training;Stair training;Functional mobility training;Therapeutic activities;Therapeutic exercise;Balance training;Neuromuscular re-education;Patient/family education;Orthotic Fit/Training   PT Next Visit Plan assess LTGs   Consulted and Agree with Plan of Care Patient;Family member/caregiver   Family Member Consulted wife      Patient will benefit from skilled therapeutic intervention in order to improve the following deficits and impairments:  Abnormal gait, Decreased activity tolerance, Decreased balance, Decreased knowledge of precautions, Decreased knowledge of use of DME, Decreased mobility, Decreased strength, Impaired tone, Impaired flexibility, Postural dysfunction, Other (comment) (orthotic dependency)  Visit Diagnosis: Other abnormalities of gait and mobility  Unsteadiness on feet  Paralytic gait  Repeated falls     Problem List Patient Active Problem List   Diagnosis Date Noted  . Deep vein thrombosis (DVT) ([place]) 03/02/2015  . Lupus anticoagulant disorder ([place]) 03/02/2015  . Ischemic stroke ([place]) 10/04/2014  . 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 Deep vein thrombosis ([place]) 10/04/2014  . 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 LA (lupus anticoagulant) disorder ([place]) 10/04/2014  . Arteriosclerosis of coronary artery 09/10/2012  . Apnea, sleep 09/10/2012  . 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 Dermatophytic onychia 01/29/2012  . Spastic hemiplegia ([place]) 12/03/2011  . Hemiparesis, left ([place]) 10/31/2011  . Dysphonia 08/13/2011  . 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 ITP (idiopathic thrombocytopenic purpura) 08/12/2011    Jamey Reas PT, DPT 05/09/2016, 11:05 AM  [facility] [address] [place], [place], [address] Phone: [phone]   Fax:  [phone]  Name: Mark [name] MRN:  [id number] Date of Birth: Oct 05, 1945

## 2016-05-13 ENCOUNTER — Encounter: Payer: Self-pay | Admitting: Physical Therapy

## 2016-05-13 ENCOUNTER — Ambulatory Visit: Payer: Medicare HMO | Admitting: Physical Therapy

## 2016-05-13 DIAGNOSIS — M6281 Muscle weakness (generalized): Secondary | ICD-10-CM | POA: Diagnosis not present

## 2016-05-13 DIAGNOSIS — R261 Paralytic gait: Secondary | ICD-10-CM | POA: Diagnosis not present

## 2016-05-13 DIAGNOSIS — R293 Abnormal posture: Secondary | ICD-10-CM | POA: Diagnosis not present

## 2016-05-13 DIAGNOSIS — R2681 Unsteadiness on feet: Secondary | ICD-10-CM | POA: Diagnosis not present

## 2016-05-13 DIAGNOSIS — G8104 Flaccid hemiplegia affecting left nondominant side: Secondary | ICD-10-CM

## 2016-05-13 DIAGNOSIS — R2689 Other abnormalities of gait and mobility: Secondary | ICD-10-CM | POA: Diagnosis not present

## 2016-05-13 DIAGNOSIS — R296 Repeated falls: Secondary | ICD-10-CM | POA: Diagnosis not present

## 2016-05-13 DIAGNOSIS — I639 Cerebral infarction, unspecified: Secondary | ICD-10-CM

## 2016-05-13 DIAGNOSIS — R69 Illness, unspecified: Secondary | ICD-10-CM | POA: Diagnosis not present

## 2016-05-14 NOTE — Therapy (Signed)
[facility] [address] [place], [place], [address] Phone: [phone]   Fax:  [phone]  Physical Therapy Treatment  Patient Details  Name: Mark [name] MRN: [id number] Date of Birth: May 22, 1945 Referring Provider: Harlan Stains, MD  Encounter Date: 05/13/2016      PT End of Session - 05/13/16 1803    Visit Number 4   Number of Visits 6   Date for PT Re-Evaluation 07/12/16   Authorization Type Medicare including G-Code & progress note   PT Start Time 2706   PT Stop Time 1505   PT Time Calculation (min) 60 min   Equipment Utilized During Treatment Gait belt   Activity Tolerance Patient tolerated treatment well;Patient limited by fatigue   Behavior During Therapy [facility] for tasks assessed/performed      History reviewed. No pertinent past medical history.  History reviewed. No pertinent surgical history.  There were no vitals filed for this visit.      Subjective Assessment - 05/13/16 1407    Subjective No falls. He did exercises some but limited due to their dog died this week.    Patient is accompained by: Family member   Pertinent History sz disorder, CAD w/MI, hepatitis, DVT, ADHD, hearing loss, ITP, sleep apnea, compression fx, HTN, depression, Lupus, CVA, Stage III CKD   Limitations Lifting;Standing;Walking   Patient Stated Goals He wants to decrease falls.    Currently in Pain? No/denies                     Self-Care: PT instructed with demo on patient proper donning & doffing [employer] arm sling. Pt & wife verbalized & demonstrated understanding.          Balance Exercises - 05/13/16 1400      OTAGO PROGRAM   Head Movements 5 reps;Sitting   Neck Movements 5 reps;Sitting   Back Extension 5 reps;Standing  RUE support   Trunk Movements 5 reps;Standing  RUE support   Ankle Movements Sitting  limited ROM LLE   Knee Extensor 10 reps   Knee Flexor 10 reps  standing with RUE  support   Hip ABductor 10 reps  standing with RUE support   Ankle Plantorflexors 20 reps, support   Ankle Dorsiflexors 20 reps, support   Knee Bends 10 reps, support  chair posterior for safety   Backwards Walking Support  counter support   Walking and Turning Around Assistive device  cane   Sideways Walking Assistive device  counter support   Tandem Stance 10 seconds, no support  support to get into position then UEs near counter for safet   Tandem Walk Support  counter support   One Leg Stand 10 seconds, no support  modified with foot in lower cabinet   Heel Walking Support  counter support   Toe Walk Support  counter support   Heel Toe Walking Backward --  counter support   Sit to Stand 10 reps, no support   Stair Walking rail support           PT Education - 05/13/16 1400    Education provided Yes   Education Details OTAGO HEP, knee to chest, hamstring & heelcord stretches seated. [employer] options including classes & fitness room    [employer]) Educated Patient;Spouse   Methods Explanation;Demonstration;Tactile cues;Verbal cues;Handout   Comprehension Verbalized understanding;Returned demonstration;Verbal cues required;Tactile cues required;Need further instruction             PT Long Term  Goals - 05/13/16 1804      PT LONG TERM GOAL #1   Title Patient and wife demonstrate and verbalize understanding of ongoing fitness plan.  (Target Date: 05/17/2016) UPDATED Target Date: 06/14/2016   Baseline 05/13/2016 NOT MET Patient missed multiple appointments due to illnesses, weather & holidays. He still requires skilled cueing   Time 2   Period Months   Status On-going     PT LONG TERM GOAL #2   Title Patient ambulates 300' on outside surfaces including grass with supervision with AFO & assistive device.  (Target Date: 05/17/2016) UPDATED Target Date 06/14/2016   Baseline 05/13/2016 NOT MET Patient missed multiple appointments due to illnesses, weather & holidays.  Patient requires skilled cues on gait.    Time 2   Period Months   Status On-going     PT LONG TERM GOAL #3   Title Patient negotiates ramps, curbs and stairs (1 rail) with AFO & assistive device with supervision.   (Target Date: 05/17/2016) UPDATED Target Date 06/14/2016   Baseline 05/13/2016 NOT MET Patient missed multiple appointments due to illnesses, weather & holidays.    Time 2   Period Months   Status On-going               Plan - 05/13/16 1807    Clinical Impression Statement Patient made progress towards LTGs but did not meet them due to attendance issues with illnesses, falls, weather & holidays. Patient needs 2 more sessions with skilled instruction to meet LTGs. Patient and wife are in agreement with recertification.    Rehab Potential Good   PT Frequency 1x / week  2 visits over next month   PT Duration 2 weeks   PT Treatment/Interventions ADLs/Self Care Home Management;DME Instruction;Gait training;Stair training;Functional mobility training;Therapeutic activities;Therapeutic exercise;Balance training;Neuromuscular re-education;Patient/family education;Orthotic Fit/Training   PT Next Visit Plan gait training with AFO & cane including outdoors if weather permits   Consulted and Agree with Plan of Care Patient;Family member/caregiver   Family Member Consulted wife      Patient will benefit from skilled therapeutic intervention in order to improve the following deficits and impairments:  Abnormal gait, Decreased activity tolerance, Decreased balance, Decreased knowledge of precautions, Decreased knowledge of use of DME, Decreased mobility, Decreased strength, Impaired tone, Impaired flexibility, Postural dysfunction, Other (comment) (orthotic dependency)  Visit Diagnosis: Other abnormalities of gait and mobility  Unsteadiness on feet  Paralytic gait  Repeated falls  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 Deep vein thrombosis (DVT) ([place]) 03/02/2015  . Lupus anticoagulant disorder ([place]) 03/02/2015  . Ischemic stroke ([place]) 10/04/2014  . 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 Deep vein thrombosis ([place]) 10/04/2014  . Immune thrombocytopenic purpura ([place]) 10/04/2014  . LA (lupus anticoagulant) disorder ([place]) 10/04/2014  . Arteriosclerosis of coronary artery 09/10/2012  . Apnea, sleep 09/10/2012  . 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 Dermatophytic onychia 01/29/2012  . Spastic hemiplegia ([place]) 12/03/2011  . Hemiparesis, left ([place]) 10/31/2011  . Dysphonia 08/13/2011  . Idiopathic thrombocytopenic purpura ([place]) 08/12/2011  . ITP (idiopathic thrombocytopenic purpura) 08/12/2011    Jamey Reas PT, DPT 05/14/2016, 10:11 AM  [facility] [address] [place], [place], [address] Phone: [phone]   Fax:  [phone]  Name: STACY DESHLER MRN: [id number] Date of Birth: June 02, 1945

## 2016-05-15 ENCOUNTER — Other Ambulatory Visit: Payer: Medicare Other

## 2016-05-22 ENCOUNTER — Other Ambulatory Visit: Payer: Medicare Other

## 2016-05-22 DIAGNOSIS — M722 Plantar fascial fibromatosis: Secondary | ICD-10-CM | POA: Diagnosis not present

## 2016-05-28 DIAGNOSIS — R69 Illness, unspecified: Secondary | ICD-10-CM | POA: Diagnosis not present

## 2016-05-28 DIAGNOSIS — Z87891 Personal history of nicotine dependence: Secondary | ICD-10-CM | POA: Diagnosis not present

## 2016-05-28 DIAGNOSIS — I69354 Hemiplegia and hemiparesis following cerebral infarction affecting left non-dominant side: Secondary | ICD-10-CM | POA: Diagnosis not present

## 2016-05-28 DIAGNOSIS — Z8669 Personal history of other diseases of the nervous system and sense organs: Secondary | ICD-10-CM | POA: Diagnosis not present

## 2016-05-28 DIAGNOSIS — Z79899 Other long term (current) drug therapy: Secondary | ICD-10-CM | POA: Diagnosis not present

## 2016-05-28 DIAGNOSIS — Z7901 Long term (current) use of anticoagulants: Secondary | ICD-10-CM | POA: Diagnosis not present

## 2016-05-28 DIAGNOSIS — R42 Dizziness and giddiness: Secondary | ICD-10-CM | POA: Diagnosis not present

## 2016-05-28 DIAGNOSIS — D6861 Antiphospholipid syndrome: Secondary | ICD-10-CM | POA: Diagnosis not present

## 2016-05-28 DIAGNOSIS — G4739 Other sleep apnea: Secondary | ICD-10-CM | POA: Diagnosis not present

## 2016-05-29 ENCOUNTER — Ambulatory Visit: Payer: Medicare HMO | Attending: Family Medicine | Admitting: Physical Therapy

## 2016-05-29 ENCOUNTER — Encounter: Payer: Self-pay | Admitting: Physical Therapy

## 2016-05-29 DIAGNOSIS — R2681 Unsteadiness on feet: Secondary | ICD-10-CM | POA: Diagnosis not present

## 2016-05-29 DIAGNOSIS — R2689 Other abnormalities of gait and mobility: Secondary | ICD-10-CM | POA: Insufficient documentation

## 2016-05-29 DIAGNOSIS — R261 Paralytic gait: Secondary | ICD-10-CM | POA: Insufficient documentation

## 2016-05-29 NOTE — Therapy (Signed)
[facility] [address] [place], [place], [address] Phone: [phone]   Fax:  [phone]  Physical Therapy Treatment  Patient Details  Name: Mark [name] MRN: [id number] Date of Birth: 03/18/1946 Referring Provider: Harlan Stains, MD  Encounter Date: 05/29/2016      PT End of Session - 05/29/16 1944    Visit Number 5   Number of Visits 6   Date for PT Re-Evaluation 07/12/16   Authorization Type Medicare including G-Code & progress note   PT Start Time 1245   PT Stop Time 1315   PT Time Calculation (min) 30 min   Activity Tolerance Patient tolerated treatment well   Behavior During Therapy [facility] for tasks assessed/performed      History reviewed. No pertinent past medical history.  History reviewed. No pertinent surgical history.  There were no vitals filed for this visit.      Subjective Assessment - 05/29/16 1943    Subjective He fell walking in dark to bathroom. No brace as middle of night. Denies injuries. He was diagnosed with right foot plantarfascitis.    Patient is accompained by: Family member   Pertinent History sz disorder, CAD w/MI, hepatitis, DVT, ADHD, hearing loss, ITP, sleep apnea, compression fx, HTN, depression, Lupus, CVA, Stage III CKD   Limitations Lifting;Standing;Walking   Patient Stated Goals He wants to decrease falls.    Currently in Pain? No/denies      Self-Care:  Pt's wife reports he donned AFO himself which is part of why they are late to appointment.  The straps were not tightened completely allowing play between shin & AFO plate which decreases effectiveness. PT demo positioning in extension to tighten completely. The T-Strap was not around strut which makes it ineffective. PT demo, instructed in proper placement. PT recommended wife go behind patient to check and tighten straps. PT also recommended full length socks for skin protection.  PT found out that replacement  straps are $50 which wife reports are not in their budget now. PT issued pieces from splint room to enable tighter attachment.  PT reviewed donning GivMohr UE brace to reduce subluxation.  PT instructed in use of night light or motion sensor light for night time gait for toileting.   Therapeutic Exercise: With recent diagnosis of plantarfascitis, PT instructed in use of frozen water bottle & baseball in foot arch for massage / stretch rolling anterior/posterior, medial /lateral, clockwise & counterclockwise and great toe extension. Wife videotaped PT performing exercises with pt. Both verbalized understanding.  PT added 1/4" lift to right shoe as Vaulting to clear LLE is primary cause of plantarfascitis. Pt ambulated 100' with single point cane with quad tip with supervision and reports less right foot pain.                                 PT Long Term Goals - 05/13/16 1804      PT LONG TERM GOAL #1   Title Patient and wife demonstrate and verbalize understanding of ongoing fitness plan.  (Target Date: 05/17/2016) UPDATED Target Date: 06/14/2016   Baseline 05/13/2016 NOT MET Patient missed multiple appointments due to illnesses, weather & holidays. He still requires skilled cueing   Time 2   Period Months   Status On-going     PT LONG TERM GOAL #2   Title Patient ambulates 300' on outside surfaces including grass with supervision with AFO & assistive  device.  (Target Date: 05/17/2016) UPDATED Target Date 06/14/2016   Baseline 05/13/2016 NOT MET Patient missed multiple appointments due to illnesses, weather & holidays. Patient requires skilled cues on gait.    Time 2   Period Months   Status On-going     PT LONG TERM GOAL #3   Title Patient negotiates ramps, curbs and stairs (1 rail) with AFO & assistive device with supervision.   (Target Date: 05/17/2016) UPDATED Target Date 06/14/2016   Baseline 05/13/2016 NOT MET Patient missed multiple appointments due to illnesses, weather  & holidays.    Time 2   Period Months   Status On-going               Plan - 05/29/16 1945    Clinical Impression Statement Patient and wife appear to understand exercises for plantarfascitis. Patient and wife verbalized understanding of use of lighting at night like motion sensor.    Rehab Potential Good   PT Frequency 1x / week  2 visits over next month   PT Duration 2 weeks   PT Treatment/Interventions ADLs/Self Care Home Management;DME Instruction;Gait training;Stair training;Functional mobility training;Therapeutic activities;Therapeutic exercise;Balance training;Neuromuscular re-education;Patient/family education;Orthotic Fit/Training   PT Next Visit Plan assess LTGs   Consulted and Agree with Plan of Care Patient;Family member/caregiver   Family Member Consulted wife      Patient will benefit from skilled therapeutic intervention in order to improve the following deficits and impairments:  Abnormal gait, Decreased activity tolerance, Decreased balance, Decreased knowledge of precautions, Decreased knowledge of use of DME, Decreased mobility, Decreased strength, Impaired tone, Impaired flexibility, Postural dysfunction, Other (comment) (orthotic dependency)  Visit Diagnosis: Other abnormalities of gait and mobility  Unsteadiness on feet  Paralytic gait     Problem List Patient Active Problem List   Diagnosis Date Noted  . Deep vein thrombosis (DVT) ([place]) 03/02/2015  . Lupus anticoagulant disorder ([place]) 03/02/2015  . Ischemic stroke ([place]) 10/04/2014  . 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 Deep vein thrombosis ([place]) 10/04/2014  . 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 LA (lupus anticoagulant) disorder ([place]) 10/04/2014  . Arteriosclerosis of coronary artery 09/10/2012  . Apnea, sleep 09/10/2012  . 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 Dermatophytic onychia 01/29/2012  . Spastic hemiplegia ([place]) 12/03/2011  . Hemiparesis, left ([place]) 10/31/2011  .  Dysphonia 08/13/2011  . Idiopathic thrombocytopenic purpura ([place]) 08/12/2011  . ITP (idiopathic thrombocytopenic purpura) 08/12/2011    Jamey Reas PT, DPT 05/29/2016, 7:48 PM  [facility] [address] [place], [place], [address] Phone: [phone]   Fax:  [phone]  Name: Mark [name] MRN: [id number] Date of Birth: March 25, 1946

## 2016-06-05 ENCOUNTER — Ambulatory Visit: Payer: Medicare Other | Admitting: Family

## 2016-06-05 ENCOUNTER — Other Ambulatory Visit (HOSPITAL_BASED_OUTPATIENT_CLINIC_OR_DEPARTMENT_OTHER): Payer: Medicare HMO

## 2016-06-05 ENCOUNTER — Other Ambulatory Visit: Payer: Medicare Other

## 2016-06-05 DIAGNOSIS — D693 Immune thrombocytopenic purpura: Secondary | ICD-10-CM | POA: Diagnosis not present

## 2016-06-05 DIAGNOSIS — I69359 Hemiplegia and hemiparesis following cerebral infarction affecting unspecified side: Secondary | ICD-10-CM

## 2016-06-05 LAB — CBC WITH DIFFERENTIAL (CANCER CENTER ONLY)
BASO#: 0.1 10*3/uL (ref 0.0–0.2)
BASO%: 0.5 % (ref 0.0–2.0)
EOS%: 5.6 % (ref 0.0–7.0)
Eosinophils Absolute: 0.6 10*3/uL — ABNORMAL HIGH (ref 0.0–0.5)
HEMATOCRIT: 39.4 % (ref 38.7–49.9)
HGB: 13.4 g/dL (ref 13.0–17.1)
LYMPH#: 3.1 10*3/uL (ref 0.9–3.3)
LYMPH%: 27 % (ref 14.0–48.0)
MCH: 30.4 pg (ref 28.0–33.4)
MCHC: 34 g/dL (ref 32.0–35.9)
MCV: 89 fL (ref 82–98)
MONO#: 2 10*3/uL — ABNORMAL HIGH (ref 0.1–0.9)
MONO%: 17.6 % — ABNORMAL HIGH (ref 0.0–13.0)
NEUT#: 5.7 10*3/uL (ref 1.5–6.5)
NEUT%: 49.3 % (ref 40.0–80.0)
PLATELETS: 249 10*3/uL (ref 145–400)
RBC: 4.41 10*6/uL (ref 4.20–5.70)
RDW: 15.5 % (ref 11.1–15.7)
WBC: 11.5 10*3/uL — ABNORMAL HIGH (ref 4.0–10.0)

## 2016-06-05 LAB — COMPREHENSIVE METABOLIC PANEL (CC13)
ALT: 36 IU/L (ref 0–44)
AST (SGOT): 32 IU/L (ref 0–40)
Albumin, Serum: 4.6 g/dL (ref 3.5–4.8)
Albumin/Globulin Ratio: 1.5 (ref 1.2–2.2)
Alkaline Phosphatase, S: 94 IU/L (ref 39–117)
BUN/Creatinine Ratio: 28 — ABNORMAL HIGH (ref 10–24)
BUN: 32 mg/dL — AB (ref 8–27)
Bilirubin Total: 0.6 mg/dL (ref 0.0–1.2)
CALCIUM: 9.7 mg/dL (ref 8.6–10.2)
CO2: 25 mmol/L (ref 18–29)
CREATININE: 1.13 mg/dL (ref 0.76–1.27)
Chloride, Ser: 104 mmol/L (ref 96–106)
GFR calc Af Amer: 76 (ref >59)
GFR, EST NON AFRICAN AMERICAN: 65 (ref >59)
GLUCOSE: 101 mg/dL — AB (ref 65–99)
Globulin, Total: 3.1 (ref 1.5–4.5)
Potassium, Ser: 4.3 mmol/L (ref 3.5–5.2)
Sodium: 135 mmol/L (ref 134–144)
Total Protein: 7.7 g/dL (ref 6.0–8.5)

## 2016-06-05 LAB — PROTIME-INR (CHCC SATELLITE)
INR: 2.6 (ref 2.0–3.5)
PROTIME: 31.2 s — AB (ref 10.6–13.4)

## 2016-06-05 LAB — TECHNOLOGIST REVIEW CHCC SATELLITE

## 2016-06-06 ENCOUNTER — Encounter: Payer: Self-pay | Admitting: *Deleted

## 2016-06-06 DIAGNOSIS — W19XXXA Unspecified fall, initial encounter: Secondary | ICD-10-CM | POA: Diagnosis not present

## 2016-06-06 DIAGNOSIS — S60222A Contusion of left hand, initial encounter: Secondary | ICD-10-CM | POA: Diagnosis not present

## 2016-06-06 DIAGNOSIS — S62640A Nondisplaced fracture of proximal phalanx of right index finger, initial encounter for closed fracture: Secondary | ICD-10-CM | POA: Diagnosis not present

## 2016-06-06 DIAGNOSIS — M5489 Other dorsalgia: Secondary | ICD-10-CM | POA: Diagnosis not present

## 2016-06-06 DIAGNOSIS — M545 Low back pain: Secondary | ICD-10-CM | POA: Diagnosis not present

## 2016-06-06 DIAGNOSIS — R42 Dizziness and giddiness: Secondary | ICD-10-CM | POA: Diagnosis not present

## 2016-06-06 DIAGNOSIS — M79642 Pain in left hand: Secondary | ICD-10-CM | POA: Diagnosis not present

## 2016-06-12 ENCOUNTER — Encounter: Payer: Medicare Other | Admitting: Physical Therapy

## 2016-06-12 ENCOUNTER — Other Ambulatory Visit: Payer: Medicare Other

## 2016-06-13 ENCOUNTER — Ambulatory Visit: Payer: Medicare HMO | Admitting: Physical Therapy

## 2016-06-13 DIAGNOSIS — G4733 Obstructive sleep apnea (adult) (pediatric): Secondary | ICD-10-CM | POA: Diagnosis not present

## 2016-06-17 ENCOUNTER — Encounter: Payer: Self-pay | Admitting: Physical Therapy

## 2016-06-17 ENCOUNTER — Ambulatory Visit: Payer: Medicare HMO | Attending: Family Medicine | Admitting: Physical Therapy

## 2016-06-17 DIAGNOSIS — R261 Paralytic gait: Secondary | ICD-10-CM

## 2016-06-17 DIAGNOSIS — R2681 Unsteadiness on feet: Secondary | ICD-10-CM

## 2016-06-17 DIAGNOSIS — R2689 Other abnormalities of gait and mobility: Secondary | ICD-10-CM | POA: Diagnosis present

## 2016-06-17 DIAGNOSIS — R296 Repeated falls: Secondary | ICD-10-CM | POA: Diagnosis present

## 2016-06-17 NOTE — Patient Instructions (Addendum)
     Sit at edge of surface with foot on front edge of second chair that is in front of right leg.  Cross left leg over to front edge of chair. Left thigh is straight Press down gently on knee. Hold _15__ seconds. __3-4_ reps with assistant pushing down. Then relax leaving foot up for total of 2 minutes. Copyright  VHI. All rights reserved.

## 2016-06-17 NOTE — Therapy (Signed)
[facility] [address] [place], [place], [address] Phone: [phone]   Fax:  [phone]  Physical Therapy Treatment  Patient Details  Name: Mark [name] MRN: [id number] Date of Birth: 1946-04-03 Referring Provider: Harlan Stains, MD  Encounter Date: 06/17/2016      PT End of Session - 06/17/16 1946    Visit Number 6   Number of Visits 6   Date for PT Re-Evaluation 07/12/16   Authorization Type Medicare including G-Code & progress note   PT Start Time 1113   PT Stop Time 1152   PT Time Calculation (min) 39 min   Activity Tolerance Patient tolerated treatment well   Behavior During Therapy [facility] for tasks assessed/performed      History reviewed. No pertinent past medical history.  History reviewed. No pertinent surgical history.  There were no vitals filed for this visit.      Subjective Assessment - 06/17/16 1117    Subjective He fell getting into car on 06/05/2014. He had already placed cane in car & lost balance. He had abrasions on left hand.      Patient is accompained by: Family member   Pertinent History sz disorder, CAD w/MI, hepatitis, DVT, ADHD, hearing loss, ITP, sleep apnea, compression fx, HTN, depression, Lupus, CVA, Stage III CKD   Limitations Lifting;Standing;Walking   Patient Stated Goals He wants to decrease falls.    Currently in Pain? No/denies            [facility] PT Assessment - 06/17/16 1115      Berg Balance Test   Sit to Stand Able to stand  independently using hands   Standing Unsupported Able to stand safely 2 minutes   Sitting with Back Unsupported but Feet Supported on Floor or Stool Able to sit safely and securely 2 minutes   Stand to Sit Controls descent by using hands   Transfers Able to transfer safely, definite need of hands   Standing Unsupported with Eyes Closed Able to stand 3 seconds   Standing Ubsupported with Feet Together Able to place feet together  independently but unable to hold for 30 seconds   From Standing, Reach Forward with Outstretched Arm Can reach forward >5 cm safely (2")   From Standing Position, Pick up Object from Floor Able to pick up shoe, needs supervision   From Standing Position, Turn to Look Behind Over each Shoulder Turn sideways only but maintains balance   Turn 360 Degrees Needs close supervision or verbal cueing   Standing Unsupported, Alternately Place Feet on Step/Stool Able to complete >2 steps/needs minimal assist   Standing Unsupported, One Foot in Front Able to take small step independently and hold 30 seconds   Standing on One Leg Tries to lift leg/unable to hold 3 seconds but remains standing independently   Total Score 33     Timed Up and Go Test   Normal TUG (seconds) 13.55  13.55sec with cane & AFO                     [facility] Adult PT Treatment/Exercise - 06/17/16 1115      Ambulation/Gait   Ambulation/Gait Yes   Ambulation/Gait Assistance 5: Supervision   Ambulation Distance (Feet) 350 Feet   Assistive device Straight cane;Other (Comment)  AFO   Ambulation Surface Indoor;Level;Outdoor;Gravel;Grass;Paved   Gait velocity 2.91 ft/sec   Stairs Yes   Stairs Assistance 5: Supervision   Stair Management Technique One rail Right;Forwards;Step to pattern  Number of Stairs 4   Ramp 5: Supervision  cane & AFO   Curb 5: Supervision  cane & AFO                PT Education - 2016-06-19 1130    Education provided Yes   Education Details Use of bar stool 24-26" for modified stand.  Exercise classes at [facility] and need for compliance with HEP including not picking out favorites only. added hip rotation stretch to HEP. Incorporating exercises into commercials with watching tv.    Person(s) Educated Patient;Spouse   Methods Explanation;Demonstration;Tactile cues;Verbal cues;Handout   Comprehension Verbalized understanding;Returned demonstration;Verbal cues required;Tactile  cues required             PT Long Term Goals - 19-Jun-2016 1947      PT LONG TERM GOAL #1   Title Patient and wife demonstrate and verbalize understanding of ongoing fitness plan.  (Target Date: 05/17/2016) UPDATED Target Date: 06/14/2016   Baseline MET 06-19-16   Time 2   Period Months   Status Achieved     PT LONG TERM GOAL #2   Title Patient ambulates 300' on outside surfaces including grass with supervision with AFO & assistive device.  (Target Date: 05/17/2016) UPDATED Target Date 06/14/2016   Baseline MET June 19, 2016   Time 2   Period Months   Status Achieved     PT LONG TERM GOAL #3   Title Patient negotiates ramps, curbs and stairs (1 rail) with AFO & assistive device with supervision.   (Target Date: 05/17/2016) UPDATED Target Date 06/14/2016   Baseline MET Jun 19, 2016   Time 2   Period Months   Status Achieved               Plan - 2016/06/19 1948    Clinical Impression Statement Patient met all LTGs. Patient appears to understand use of bar stool as modified stand to enable longer periods of standing for singing.    Rehab Potential Good   PT Frequency 1x / week  2 visits over next month   PT Duration 2 weeks   PT Treatment/Interventions ADLs/Self Care Home Management;DME Instruction;Gait training;Stair training;Functional mobility training;Therapeutic activities;Therapeutic exercise;Balance training;Neuromuscular re-education;Patient/family education;Orthotic Fit/Training   PT Next Visit Plan discharge   Consulted and Agree with Plan of Care Patient;Family member/caregiver   Family Member Consulted wife      Patient will benefit from skilled therapeutic intervention in order to improve the following deficits and impairments:  Abnormal gait, Decreased activity tolerance, Decreased balance, Decreased knowledge of precautions, Decreased knowledge of use of DME, Decreased mobility, Decreased strength, Impaired tone, Impaired flexibility, Postural dysfunction, Other (comment)  (orthotic dependency)  Visit Diagnosis: Other abnormalities of gait and mobility  Unsteadiness on feet  Paralytic gait  Repeated falls       G-Codes - Jun 19, 2016 1200    Functional Assessment Tool Used (Outpatient Only) Timed Up-Go with cane & AFO 13.55sec   Functional Limitation Mobility: Walking and moving around   Mobility: Walking and Moving Around Goal Status [phone]) At least 40 percent but less than 60 percent impaired, limited or restricted   Mobility: Walking and Moving Around Discharge Status [phone]) At least 60 percent but less than 80 percent impaired, limited or restricted      Problem List Patient Active Problem List   Diagnosis Date Noted  . Deep vein thrombosis (DVT) ([place]) 03/02/2015  . Lupus anticoagulant disorder ([place]) 03/02/2015  . Ischemic stroke ([place]) 10/04/2014  . 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  Deep vein thrombosis ([place]) 10/04/2014  . 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 LA (lupus anticoagulant) disorder ([place]) 10/04/2014  . Arteriosclerosis of coronary artery 09/10/2012  . Apnea, sleep 09/10/2012  . 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 Dermatophytic onychia 01/29/2012  . Spastic hemiplegia ([place]) 12/03/2011  . Hemiparesis, left ([place]) 10/31/2011  . Dysphonia 08/13/2011  . Idiopathic thrombocytopenic purpura ([place]) 08/12/2011  . ITP (idiopathic thrombocytopenic purpura) 08/12/2011   PHYSICAL THERAPY DISCHARGE SUMMARY  Visits from Start of Care: 6  Current functional level related to goals / functional outcomes: See above   Remaining deficits: See above   Education / Equipment: HEP  Plan: Patient agrees to discharge.  Patient goals were met. Patient is being discharged due to meeting the stated rehab goals.  ?????         Taariq Leitz PT DPT 06/17/2016, 8:00 PM  [facility] [address] [place], [place], [address] Phone: [phone]   Fax:   [phone]  Name: MJ WILLIS MRN: [id number] Date of Birth: September 27, 1945

## 2016-06-19 DIAGNOSIS — L918 Other hypertrophic disorders of the skin: Secondary | ICD-10-CM | POA: Diagnosis not present

## 2016-06-26 ENCOUNTER — Other Ambulatory Visit: Payer: Medicare HMO

## 2016-06-26 ENCOUNTER — Ambulatory Visit: Payer: Medicare HMO | Admitting: Family

## 2016-07-03 ENCOUNTER — Other Ambulatory Visit: Payer: Medicare Other

## 2016-07-03 ENCOUNTER — Other Ambulatory Visit: Payer: Self-pay | Admitting: Hematology & Oncology

## 2016-07-04 ENCOUNTER — Encounter: Payer: Self-pay | Admitting: Family

## 2016-07-04 ENCOUNTER — Other Ambulatory Visit (HOSPITAL_BASED_OUTPATIENT_CLINIC_OR_DEPARTMENT_OTHER): Payer: Medicare HMO

## 2016-07-04 ENCOUNTER — Ambulatory Visit (HOSPITAL_BASED_OUTPATIENT_CLINIC_OR_DEPARTMENT_OTHER): Payer: Medicare HMO | Admitting: Family

## 2016-07-04 VITALS — BP 114/72 | HR 73 | Temp 98.0°F | Resp 17 | Wt 200.1 lb

## 2016-07-04 DIAGNOSIS — D693 Immune thrombocytopenic purpura: Secondary | ICD-10-CM

## 2016-07-04 DIAGNOSIS — D6862 Lupus anticoagulant syndrome: Secondary | ICD-10-CM | POA: Diagnosis not present

## 2016-07-04 DIAGNOSIS — I69359 Hemiplegia and hemiparesis following cerebral infarction affecting unspecified side: Secondary | ICD-10-CM

## 2016-07-04 DIAGNOSIS — I639 Cerebral infarction, unspecified: Secondary | ICD-10-CM | POA: Diagnosis not present

## 2016-07-04 LAB — PROTIME-INR (CHCC SATELLITE)
INR: 2.5 (ref 2.0–3.5)
Protime: 30 Seconds — ABNORMAL HIGH (ref 10.6–13.4)

## 2016-07-04 LAB — CBC WITH DIFFERENTIAL (CANCER CENTER ONLY)
BASO#: 0.1 10*3/uL (ref 0.0–0.2)
BASO%: 0.5 % (ref 0.0–2.0)
EOS ABS: 0.6 10*3/uL — AB (ref 0.0–0.5)
EOS%: 5.8 % (ref 0.0–7.0)
HEMATOCRIT: 41.9 % (ref 38.7–49.9)
HGB: 14.2 g/dL (ref 13.0–17.1)
LYMPH#: 3.6 10*3/uL — AB (ref 0.9–3.3)
LYMPH%: 36 % (ref 14.0–48.0)
MCH: 30.4 pg (ref 28.0–33.4)
MCHC: 33.9 g/dL (ref 32.0–35.9)
MCV: 90 fL (ref 82–98)
MONO#: 2.2 10*3/uL — AB (ref 0.1–0.9)
MONO%: 22 % — ABNORMAL HIGH (ref 0.0–13.0)
NEUT#: 3.6 10*3/uL (ref 1.5–6.5)
NEUT%: 35.7 % — AB (ref 40.0–80.0)
PLATELETS: 194 10*3/uL (ref 145–400)
RBC: 4.67 10*6/uL (ref 4.20–5.70)
RDW: 15.3 % (ref 11.1–15.7)
WBC: 10 10*3/uL (ref 4.0–10.0)

## 2016-07-04 NOTE — Progress Notes (Signed)
Hematology and Oncology Follow Up Visit  Mark [name] [id number] 06-18-1945 71 y.o. 07/04/2016   Principle Diagnosis:  1. Refractory immune thrombocytopenia. 2. Positive lupus anticoagulant. 3. 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 Coumadin to maintain INR between(2-2-4 schedule)  2. Decadron - the patient to take at his discretion    Interim History:  Mark [name] is here today for a follow-up. He continues to do well and is excited to share today that he has started singing again. He has joined a Medical sales representative with his daughter and sister and they are having a wonderful time. He can tell that his breathing/SOB and stamina are improving. They had their first group performance recently and enjoyed getting all dressed up. His wife had pictures and everyone looked quite dapper.  He sang for Mark [name] today the song he wrote for his wife on their wedding day. It was beautiful. He continues to do well on coumadin. His INR is therapeutic at 2.5 so he will continue on his same dose and regimen. He continue to take his vitamin K 100 mg PO daily.  No episodes of bleeding, bruising or petechiae. No fever, chills, n/v, cough, rash, dizziness, SOB, chest pain, palpitations, abdominal pain or changes in bowel or bladder habits. He had a fall where he placed his cane in the car first and lost his footing. He had an abrasion to the left knuckles that has healed nicely. Thankfully, he was not seriously injured.  No lymphadenopathy found on exam. 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 He wears a brace to help with support on the left foot and will be getting a new one soon.  He has maintained a good appetite and is staying well hydrated. His weight is stable.   Medications:  Allergies as of 07/04/2016   No Known Allergies     Medication List       Accurate as of 07/04/16  2:40 PM. Always use your most recent med list.          alendronate 70 MG  tablet Commonly known as:  FOSAMAX Take 70 mg by mouth every 7 (seven) days. Take with a full glass of water on an empty stomach.   amantadine 100 MG capsule Commonly known as:  SYMMETREL   amLODipine 2.5 MG tablet Commonly known as:  NORVASC   BENADRYL 25 MG tablet Generic drug:  diphenhydrAMINE Take 25 mg by mouth every 6 (six) hours as needed (may take 1 to 2 tabs).   bethanechol 25 MG tablet Commonly known as:  URECHOLINE Take 25 mg by mouth 2 (two) times daily.   buPROPion 150 MG 24 hr tablet Commonly known as:  WELLBUTRIN XL   CRESTOR 20 MG tablet Generic drug:  rosuvastatin Take by mouth daily.   dexamethasone 4 MG tablet Commonly known as:  DECADRON TAKE AS NEEDED ONLY. TAKE 10 TABLET (40MG ) BY MOUTH DAILY FOR 4 DAYS THEN STOP   famotidine 40 MG tablet Commonly known as:  PEPCID Take 40 mg by mouth 2 (two) times daily.   fluticasone 50 MCG/ACT nasal spray Commonly known as:  FLONASE Place 2 sprays into both nostrils as needed.   levETIRAcetam 500 MG tablet Commonly known as:  KEPPRA 500 mg. 3 tabs  in am and 3 tabs in pm   modafinil 200 MG tablet Commonly known as:  PROVIGIL Take 200 mg by mouth daily.   multivitamin with minerals tablet Take 1 tablet by mouth  daily.   NYSTATIN (TOPICAL) Powd Apply 1 application topically 2 (two) times daily.   sertraline 100 MG tablet Commonly known as:  ZOLOFT Take 100 mg by mouth 2 (two) times daily. 1 tab in AM and 1/2 tab in PM=150 mg daily   tamsulosin 0.4 MG Caps capsule Commonly known as:  FLOMAX Take 0.8 mg by mouth daily. 2 CAP = 0.8 MG   traMADol 50 MG tablet Commonly known as:  ULTRAM TAKE ONE TABLET BY MOUTH EVERY 6 HOURS AS NEEDED FOR PAIN   TYLENOL 500 MG tablet Generic drug:  acetaminophen Take 1,000 mg by mouth every 8 (eight) hours as needed.   Vitamin D3 1000 units Caps Take 2,000 Units by mouth 2 (two) times daily after a meal.   VITAMIN K PO Take 100 mcg by mouth daily.   warfarin 2  MG tablet Commonly known as:  COUMADIN Take 2 mg by mouth daily.   warfarin 2 MG tablet Commonly known as:  COUMADIN TAKE 2 TABLETS BY MOUTH ON THE FIRST DAY, THEN TAKE 1 TABLET THE SECOND DAY, REPEAT THIS PATTERN UNTIL DOCTOR CHANGES       Allergies: No Known Allergies  Past Medical History, Surgical history, Social history, and Family History were reviewed and updated.  Review of Systems: All other 10 point review of systems is negative.   Physical Exam:  vitals were not taken for this visit.  Wt Readings from Last 3 Encounters:  05/09/16 198 lb (89.8 kg)  02/14/16 206 lb 0.6 oz (93.5 kg)  12/27/15 204 lb (92.5 kg)    Ocular: Sclerae unicteric, pupils equal, round and reactive to light Ear-nose-throat: Oropharynx clear, dentition fair Lymphatic: No cervical supraclavicular or axillary adenopathy Lungs no rales or rhonchi, good excursion bilaterally Heart regular rate and rhythm, no murmur appreciated Abd soft, nontender, positive bowel sounds, no liver or spleen tip palpated on exam, no fluid wave MSK no focal spinal tenderness, no joint edema Neuro: non-focal, well-oriented, appropriate affect Breasts: Deferred  Lab Results  Component Value Date   WBC 11.5 (H) 06/05/2016   HGB 13.4 06/05/2016   HCT 39.4 06/05/2016   MCV 89 06/05/2016   PLT 249 06/05/2016   Lab Results  Component Value Date   FERRITIN 62 08/20/2010   Lab Results  Component Value Date   RETICCTPCT 1.5 08/20/2010   RBC 4.41 06/05/2016   RETICCTABS 75.3 08/20/2010   No results found for: KPAFRELGTCHN, LAMBDASER, KAPLAMBRATIO No results found for: IGGSERUM, IGA, IGMSERUM No results found for: Odetta Pink, SPEI   Chemistry      Component Value Date/Time   NA 135 06/05/2016 1439   NA 138 05/09/2016 1321   K 4.3 06/05/2016 1439   K 4.3 05/09/2016 1321   CL 104 06/05/2016 1439   CL 106 10/20/2015 1426   CO2 25 06/05/2016 1439   CO2 22  05/09/2016 1321   BUN 32 (H) 06/05/2016 1439   BUN 36.4 (H) 05/09/2016 1321   CREATININE 1.13 06/05/2016 1439   CREATININE 1.5 (H) 05/09/2016 1321      Component Value Date/Time   CALCIUM 9.7 06/05/2016 1439   CALCIUM 9.9 05/09/2016 1321   ALKPHOS 94 06/05/2016 1439   ALKPHOS 103 05/09/2016 1321   AST 32 06/05/2016 1439   AST 39 (H) 05/09/2016 1321   ALT 36 06/05/2016 1439   ALT 64 (H) 05/09/2016 1321   BILITOT 0.6 06/05/2016 1439   BILITOT 0.49 05/09/2016 1321  Impression and Plan: Mr. Hyland is 71 year old gentleman with refractory immune thrombocytopenia. 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  He has done well on Coumadin and his INR is theraputic at 2.5. He will continue on his current Coumadin regimen.  We will continue checking his labs every 3 weeks and then plan to see him back for a follow-up in 6 weeks.  Both he and his wonderful wife know to contact Mark [name] with any other questions or concerns. We can certainly see him sooner if need be.   Eliezer Bottom, NP 3/22/20182:40 PM

## 2016-07-05 ENCOUNTER — Encounter: Payer: Self-pay | Admitting: Physical Therapy

## 2016-07-05 ENCOUNTER — Encounter: Payer: Self-pay | Admitting: Hematology & Oncology

## 2016-07-05 ENCOUNTER — Encounter: Payer: Self-pay | Admitting: Family

## 2016-07-09 DIAGNOSIS — G4733 Obstructive sleep apnea (adult) (pediatric): Secondary | ICD-10-CM | POA: Diagnosis not present

## 2016-07-17 ENCOUNTER — Other Ambulatory Visit: Payer: Medicare HMO

## 2016-07-22 DIAGNOSIS — K432 Incisional hernia without obstruction or gangrene: Secondary | ICD-10-CM | POA: Diagnosis not present

## 2016-07-24 ENCOUNTER — Other Ambulatory Visit: Payer: Medicare Other

## 2016-08-07 ENCOUNTER — Other Ambulatory Visit: Payer: Medicare HMO

## 2016-08-13 DIAGNOSIS — H26491 Other secondary cataract, right eye: Secondary | ICD-10-CM | POA: Diagnosis not present

## 2016-08-13 DIAGNOSIS — H10413 Chronic giant papillary conjunctivitis, bilateral: Secondary | ICD-10-CM | POA: Diagnosis not present

## 2016-08-13 DIAGNOSIS — H04123 Dry eye syndrome of bilateral lacrimal glands: Secondary | ICD-10-CM | POA: Diagnosis not present

## 2016-08-13 DIAGNOSIS — Z961 Presence of intraocular lens: Secondary | ICD-10-CM | POA: Diagnosis not present

## 2016-08-14 ENCOUNTER — Ambulatory Visit (HOSPITAL_BASED_OUTPATIENT_CLINIC_OR_DEPARTMENT_OTHER): Payer: Medicare HMO | Admitting: Family

## 2016-08-14 ENCOUNTER — Telehealth: Payer: Self-pay | Admitting: *Deleted

## 2016-08-14 ENCOUNTER — Other Ambulatory Visit (HOSPITAL_BASED_OUTPATIENT_CLINIC_OR_DEPARTMENT_OTHER): Payer: Medicare HMO

## 2016-08-14 ENCOUNTER — Other Ambulatory Visit: Payer: Medicare Other

## 2016-08-14 VITALS — BP 119/49 | HR 74 | Temp 98.9°F | Resp 18 | Wt 204.4 lb

## 2016-08-14 DIAGNOSIS — D693 Immune thrombocytopenic purpura: Secondary | ICD-10-CM | POA: Diagnosis not present

## 2016-08-14 DIAGNOSIS — R233 Spontaneous ecchymoses: Secondary | ICD-10-CM

## 2016-08-14 DIAGNOSIS — D6862 Lupus anticoagulant syndrome: Secondary | ICD-10-CM | POA: Diagnosis not present

## 2016-08-14 DIAGNOSIS — S301XXA Contusion of abdominal wall, initial encounter: Secondary | ICD-10-CM

## 2016-08-14 DIAGNOSIS — W06XXXA Fall from bed, initial encounter: Secondary | ICD-10-CM | POA: Diagnosis not present

## 2016-08-14 DIAGNOSIS — Z8673 Personal history of transient ischemic attack (TIA), and cerebral infarction without residual deficits: Secondary | ICD-10-CM | POA: Diagnosis not present

## 2016-08-14 DIAGNOSIS — S7002XA Contusion of left hip, initial encounter: Secondary | ICD-10-CM

## 2016-08-14 DIAGNOSIS — I639 Cerebral infarction, unspecified: Secondary | ICD-10-CM

## 2016-08-14 LAB — CBC WITH DIFFERENTIAL (CANCER CENTER ONLY)
BASO#: 0 10*3/uL (ref 0.0–0.2)
BASO%: 0.1 % (ref 0.0–2.0)
EOS ABS: 1 10*3/uL — AB (ref 0.0–0.5)
EOS%: 5.9 % (ref 0.0–7.0)
HEMATOCRIT: 40.6 % (ref 38.7–49.9)
HEMOGLOBIN: 13.8 g/dL (ref 13.0–17.1)
LYMPH#: 3.5 10*3/uL — AB (ref 0.9–3.3)
LYMPH%: 20.7 % (ref 14.0–48.0)
MCH: 30.4 pg (ref 28.0–33.4)
MCHC: 34 g/dL (ref 32.0–35.9)
MCV: 89 fL (ref 82–98)
MONO#: 2.9 10*3/uL — AB (ref 0.1–0.9)
MONO%: 17 % — ABNORMAL HIGH (ref 0.0–13.0)
NEUT%: 56.3 % (ref 40.0–80.0)
NEUTROS ABS: 9.4 10*3/uL — AB (ref 1.5–6.5)
Platelets: 220 10*3/uL (ref 145–400)
RBC: 4.54 10*6/uL (ref 4.20–5.70)
RDW: 14.9 % (ref 11.1–15.7)
WBC: 16.8 10*3/uL — AB (ref 4.0–10.0)

## 2016-08-14 LAB — CMP (CANCER CENTER ONLY)
ALK PHOS: 70 U/L (ref 26–84)
ALT: 79 U/L — AB (ref 10–47)
AST: 31 U/L (ref 11–38)
Albumin: 3.4 g/dL (ref 3.3–5.5)
BUN, Bld: 37 mg/dL — ABNORMAL HIGH (ref 7–22)
CALCIUM: 9 mg/dL (ref 8.0–10.3)
CHLORIDE: 105 meq/L (ref 98–108)
CO2: 24 mEq/L (ref 18–33)
Creat: 1.3 mg/dl — ABNORMAL HIGH (ref 0.6–1.2)
GLUCOSE: 122 mg/dL — AB (ref 73–118)
POTASSIUM: 3.6 meq/L (ref 3.3–4.7)
Sodium: 140 mEq/L (ref 128–145)
TOTAL PROTEIN: 6.6 g/dL (ref 6.4–8.1)
Total Bilirubin: 0.7 mg/dl (ref 0.20–1.60)

## 2016-08-14 LAB — PROTIME-INR (CHCC SATELLITE)
INR: 4.1 — AB (ref 2.0–3.5)
Protime: 49.2 Seconds — ABNORMAL HIGH (ref 10.6–13.4)

## 2016-08-14 LAB — TECHNOLOGIST REVIEW CHCC SATELLITE

## 2016-08-14 NOTE — Telephone Encounter (Signed)
Wife notifying the office that patient had a fall on Saturday. He has since developed a large hematoma to his hip. She states its hard and larger than usual.  Spoke to Dr Marin Olp. We will have patient come into the office today for evaluation.   Patient's wife aware of appointment.

## 2016-08-14 NOTE — Progress Notes (Signed)
Hematology and Oncology Follow Up Visit  Mark [name] [id number] [birth year] 71 y.o. 08/14/2016   Principle Diagnosis:  1. Refractory immune thrombocytopenia. 2. 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 3. Positive lupus anticoagulant.  Current Therapy:   Coumadin to maintain INR between 2-3 Decadron - taken at patients discretion   Interim History:  Mark [name] is here today with his lovely wife for follow-up. He states that he fell while getting out of bed without his cane and thankfully was not seriously injured. He has bruising along his left flank and on the left hip that is healing slowly.  He has done 4 days of Decadron 40 mg PO for the petechia. His INR today is 4.1. Hgb is stable at 13.8.  He denies having any episodes of bleeding. No lymphadenopathy found on exam.  No fever, chills, n/v, cough, rash, dizziness, SOB, chest pain, palpitations, abdominal pain or changes in bowel or bladder habits.  The neuropathy in his hands and feet is unchanged. He is wearing his left leg brace and ambulates with his cane.  He has maintained a good appetite and is staying well hydrated. Her weight is stable.   ECOG Performance Status: 1 - Symptomatic but completely ambulatory  Medications:  Allergies as of 08/14/2016   No Known Allergies     Medication List       Accurate as of 08/14/16  2:05 PM. Always use your most recent med list.          alendronate 70 MG tablet Commonly known as:  FOSAMAX Take 70 mg by mouth every 7 (seven) days. Take with a full glass of water on an empty stomach.   amLODipine 2.5 MG tablet Commonly known as:  NORVASC   BENADRYL 25 MG tablet Generic drug:  diphenhydrAMINE Take 25 mg by mouth every 6 (six) hours as needed (may take 1 to 2 tabs).   bethanechol 25 MG tablet Commonly known as:  URECHOLINE Take 25 mg by mouth 2 (two) times daily.   buPROPion 150 MG 24 hr tablet Commonly known as:  WELLBUTRIN XL   CRESTOR 20 MG  tablet Generic drug:  rosuvastatin Take by mouth daily.   dexamethasone 4 MG tablet Commonly known as:  DECADRON TAKE AS NEEDED ONLY. TAKE 10 TABLET (40MG ) BY MOUTH DAILY FOR 4 DAYS THEN STOP   famotidine 40 MG tablet Commonly known as:  PEPCID Take 40 mg by mouth 2 (two) times daily.   fluticasone 50 MCG/ACT nasal spray Commonly known as:  FLONASE Place 2 sprays into both nostrils as needed.   levETIRAcetam 500 MG tablet Commonly known as:  KEPPRA 500 mg. 3 tabs  in am and 3 tabs in pm   multivitamin with minerals tablet Take 1 tablet by mouth daily.   NYSTATIN (TOPICAL) Powd Apply 1 application topically 2 (two) times daily.   sertraline 100 MG tablet Commonly known as:  ZOLOFT Take 100 mg by mouth daily. 2 tabs in the am   tamsulosin 0.4 MG Caps capsule Commonly known as:  FLOMAX Take 0.8 mg by mouth daily. 2 CAP = 0.8 MG   traMADol 50 MG tablet Commonly known as:  ULTRAM TAKE ONE TABLET BY MOUTH EVERY 6 HOURS AS NEEDED FOR PAIN   TYLENOL 500 MG tablet Generic drug:  acetaminophen Take 1,000 mg by mouth every 8 (eight) hours as needed.   Vitamin D3 1000 units Caps Take 2,000 Units by mouth 2 (two) times daily after a meal.   VITAMIN  K PO Take 100 mcg by mouth daily.   warfarin 2 MG tablet Commonly known as:  COUMADIN TAKE 2 TABLETS BY MOUTH ON THE FIRST DAY, THEN TAKE 1 TABLET THE SECOND DAY, REPEAT THIS PATTERN UNTIL DOCTOR CHANGES       Allergies: No Known Allergies  Past Medical History, Surgical history, Social history, and Family History were reviewed and updated.  Review of Systems: All other 10 point review of systems is negative.   Physical Exam:  vitals were not taken for this visit.  Wt Readings from Last 3 Encounters:  07/04/16 200 lb 1.9 oz (90.8 kg)  05/09/16 198 lb (89.8 kg)  02/14/16 206 lb 0.6 oz (93.5 kg)    Ocular: Sclerae unicteric, pupils equal, round and reactive to light Ear-nose-throat: Oropharynx clear, dentition  fair Lymphatic: No cervical, supraclavicular or axillary adenopathy Lungs no rales or rhonchi, good excursion bilaterally Heart regular rate and rhythm, no murmur appreciated Abd soft, nontender, positive bowel sounds, no liver or spleen tip palpated on exam, no fluid wave MSK no focal spinal tenderness, no joint edema Neuro: non-focal, well-oriented, appropriate affect Breasts: Deferred   Lab Results  Component Value Date   WBC 10.0 07/04/2016   HGB 14.2 07/04/2016   HCT 41.9 07/04/2016   MCV 90 07/04/2016   PLT 194 07/04/2016   Lab Results  Component Value Date   FERRITIN 62 08/20/2010   Lab Results  Component Value Date   RETICCTPCT 1.5 08/20/2010   RBC 4.67 07/04/2016   RETICCTABS 75.3 08/20/2010   No results found for: KPAFRELGTCHN, LAMBDASER, KAPLAMBRATIO No results found for: IGGSERUM, IGA, IGMSERUM No results found for: Odetta Pink, SPEI   Chemistry      Component Value Date/Time   NA 135 06/05/2016 1439   NA 138 05/09/2016 1321   K 4.3 06/05/2016 1439   K 4.3 05/09/2016 1321   CL 104 06/05/2016 1439   CL 106 10/20/2015 1426   CO2 25 06/05/2016 1439   CO2 22 05/09/2016 1321   BUN 32 (H) 06/05/2016 1439   BUN 36.4 (H) 05/09/2016 1321   CREATININE 1.13 06/05/2016 1439   CREATININE 1.5 (H) 05/09/2016 1321      Component Value Date/Time   CALCIUM 9.7 06/05/2016 1439   CALCIUM 9.9 05/09/2016 1321   ALKPHOS 94 06/05/2016 1439   ALKPHOS 103 05/09/2016 1321   AST 32 06/05/2016 1439   AST 39 (H) 05/09/2016 1321   ALT 36 06/05/2016 1439   ALT 64 (H) 05/09/2016 1321   BILITOT 0.6 06/05/2016 1439   BILITOT 0.49 05/09/2016 1321      Impression and Plan: Mark [name] is avery pleasant 71 yo gentleman with refractory immune thrombocytopenia. 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 He had some petechiae and bruising with a recent fall so he took Decadron 40 mg PO daily for 4  days. His INR at this time is 4.1 so we will have him hold his coumadin for 2 days and then restart back on his same regimen. The bruising and petechiae appear to be healing and fading nicely.  We will plan to recheck his INR in 1 week. Follow-up and lab again in 3 weeks.  Both he and his wife are in agreement with the plan and will contact our office with any questions or concerns. We can certainly see him sooner if need be.   Eliezer Bottom, NP 5/2/20182:05 PM

## 2016-08-16 ENCOUNTER — Other Ambulatory Visit: Payer: Medicare HMO

## 2016-08-16 ENCOUNTER — Ambulatory Visit: Payer: Medicare HMO | Admitting: Hematology & Oncology

## 2016-08-22 ENCOUNTER — Other Ambulatory Visit: Payer: Medicare Other

## 2016-08-28 ENCOUNTER — Other Ambulatory Visit (HOSPITAL_BASED_OUTPATIENT_CLINIC_OR_DEPARTMENT_OTHER): Payer: Medicare HMO

## 2016-08-28 DIAGNOSIS — D6862 Lupus anticoagulant syndrome: Secondary | ICD-10-CM | POA: Diagnosis not present

## 2016-08-28 DIAGNOSIS — Z8673 Personal history of transient ischemic attack (TIA), and cerebral infarction without residual deficits: Secondary | ICD-10-CM

## 2016-08-28 DIAGNOSIS — I69359 Hemiplegia and hemiparesis following cerebral infarction affecting unspecified side: Secondary | ICD-10-CM

## 2016-08-28 LAB — CBC WITH DIFFERENTIAL (CANCER CENTER ONLY)
BASO#: 0 10*3/uL (ref 0.0–0.2)
BASO%: 0.4 % (ref 0.0–2.0)
EOS ABS: 0.6 10*3/uL — AB (ref 0.0–0.5)
EOS%: 8.1 % — ABNORMAL HIGH (ref 0.0–7.0)
HCT: 40.7 % (ref 38.7–49.9)
HGB: 13.7 g/dL (ref 13.0–17.1)
LYMPH#: 2.8 10*3/uL (ref 0.9–3.3)
LYMPH%: 35.1 % (ref 14.0–48.0)
MCH: 30.6 pg (ref 28.0–33.4)
MCHC: 33.7 g/dL (ref 32.0–35.9)
MCV: 91 fL (ref 82–98)
MONO#: 1.5 10*3/uL — AB (ref 0.1–0.9)
MONO%: 18.5 % — AB (ref 0.0–13.0)
NEUT%: 37.9 % — AB (ref 40.0–80.0)
NEUTROS ABS: 3 10*3/uL (ref 1.5–6.5)
PLATELETS: 122 10*3/uL — AB (ref 145–400)
RBC: 4.48 10*6/uL (ref 4.20–5.70)
RDW: 14.9 % (ref 11.1–15.7)
WBC: 7.9 10*3/uL (ref 4.0–10.0)

## 2016-08-28 LAB — PROTIME-INR (CHCC SATELLITE)
INR: 2.7 (ref 2.0–3.5)
PROTIME: 32.4 s — AB (ref 10.6–13.4)

## 2016-08-29 DIAGNOSIS — G4733 Obstructive sleep apnea (adult) (pediatric): Secondary | ICD-10-CM | POA: Diagnosis not present

## 2016-09-02 DIAGNOSIS — G4733 Obstructive sleep apnea (adult) (pediatric): Secondary | ICD-10-CM | POA: Diagnosis not present

## 2016-09-03 ENCOUNTER — Other Ambulatory Visit: Payer: Medicare Other

## 2016-09-03 ENCOUNTER — Ambulatory Visit: Payer: Medicare Other | Admitting: Family

## 2016-09-04 ENCOUNTER — Other Ambulatory Visit: Payer: Medicare Other

## 2016-09-10 ENCOUNTER — Ambulatory Visit (HOSPITAL_BASED_OUTPATIENT_CLINIC_OR_DEPARTMENT_OTHER): Payer: Medicare HMO | Admitting: Family

## 2016-09-10 ENCOUNTER — Other Ambulatory Visit (HOSPITAL_BASED_OUTPATIENT_CLINIC_OR_DEPARTMENT_OTHER): Payer: Medicare HMO

## 2016-09-10 VITALS — BP 107/65 | HR 72 | Temp 98.9°F | Resp 18 | Wt 205.1 lb

## 2016-09-10 DIAGNOSIS — I69359 Hemiplegia and hemiparesis following cerebral infarction affecting unspecified side: Secondary | ICD-10-CM

## 2016-09-10 DIAGNOSIS — D693 Immune thrombocytopenic purpura: Secondary | ICD-10-CM

## 2016-09-10 DIAGNOSIS — I639 Cerebral infarction, unspecified: Secondary | ICD-10-CM

## 2016-09-10 DIAGNOSIS — D6862 Lupus anticoagulant syndrome: Secondary | ICD-10-CM | POA: Diagnosis not present

## 2016-09-10 DIAGNOSIS — I69354 Hemiplegia and hemiparesis following cerebral infarction affecting left non-dominant side: Secondary | ICD-10-CM | POA: Diagnosis not present

## 2016-09-10 DIAGNOSIS — Z7901 Long term (current) use of anticoagulants: Secondary | ICD-10-CM

## 2016-09-10 LAB — CBC WITH DIFFERENTIAL (CANCER CENTER ONLY)
BASO#: 0 10*3/uL (ref 0.0–0.2)
BASO%: 0.5 % (ref 0.0–2.0)
EOS ABS: 0.3 10*3/uL (ref 0.0–0.5)
EOS%: 3.9 % (ref 0.0–7.0)
HCT: 41.2 % (ref 38.7–49.9)
HEMOGLOBIN: 13.8 g/dL (ref 13.0–17.1)
LYMPH#: 2.7 10*3/uL (ref 0.9–3.3)
LYMPH%: 32.4 % (ref 14.0–48.0)
MCH: 30.5 pg (ref 28.0–33.4)
MCHC: 33.5 g/dL (ref 32.0–35.9)
MCV: 91 fL (ref 82–98)
MONO#: 2 10*3/uL — ABNORMAL HIGH (ref 0.1–0.9)
MONO%: 23.8 % — AB (ref 0.0–13.0)
NEUT%: 39.4 % — ABNORMAL LOW (ref 40.0–80.0)
NEUTROS ABS: 3.2 10*3/uL (ref 1.5–6.5)
Platelets: 246 10*3/uL (ref 145–400)
RBC: 4.53 10*6/uL (ref 4.20–5.70)
RDW: 14.9 % (ref 11.1–15.7)
WBC: 8.2 10*3/uL (ref 4.0–10.0)

## 2016-09-10 LAB — PROTIME-INR (CHCC SATELLITE)
INR: 2.8 (ref 2.0–3.5)
PROTIME: 33.6 s — AB (ref 10.6–13.4)

## 2016-09-10 NOTE — Progress Notes (Signed)
Hematology and Oncology Follow Up Visit  Mark [name] [id number] 05-16-1945 71 y.o. 09/10/2016   Principle Diagnosis:  1. Refractory immune thrombocytopenia. 2. 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 3. Positive lupus anticoagulant.  Current Therapy:   Coumadin to maintain INR between 2-3 Decadron - taken at patients discretion    Interim History:  Mark [name] is here today with his sweet wife for follow-up. He is doing well and they are excited to show [place] pictures of them renewing their vows last week. They had a wonderful time and he is still enjoying singing in a choir.  No episodes of bleeding, bruising or petechiae. No lymphadenopathy found on exam.  INR today is 2.8 so we will continue  No fever, chills, n/v, cough, rash, dizziness, SOB, chest pain, palpitations, abdominal pain or changes in bowel or bladder habits.  No swelling in his extremities at this time. The numbness and tingling in his hands and feet is unchanged. He has his leg brace on the left leg for support and using his cane when ambulating.  He has maintained a good appetite and is staying well hydrated. His weight is stable.   ECOG Performance Status: 1 - Symptomatic but completely ambulatory  Medications:  Allergies as of 09/10/2016   No Known Allergies     Medication List       Accurate as of 09/10/16  3:43 PM. Always use your most recent med list.          alendronate 70 MG tablet Commonly known as:  FOSAMAX Take 70 mg by mouth every 7 (seven) days. Take with a full glass of water on an empty stomach.   amLODipine 2.5 MG tablet Commonly known as:  NORVASC   BENADRYL 25 MG tablet Generic drug:  diphenhydrAMINE Take 25 mg by mouth every 6 (six) hours as needed (may take 1 to 2 tabs).   bethanechol 25 MG tablet Commonly known as:  URECHOLINE Take 25 mg by mouth 2 (two) times daily.   buPROPion 150 MG 24 hr tablet Commonly known as:  WELLBUTRIN XL   CRESTOR 20 MG  tablet Generic drug:  rosuvastatin Take by mouth daily.   dexamethasone 4 MG tablet Commonly known as:  DECADRON TAKE AS NEEDED ONLY. TAKE 10 TABLET (40MG ) BY MOUTH DAILY FOR 4 DAYS THEN STOP   famotidine 40 MG tablet Commonly known as:  PEPCID Take 40 mg by mouth 2 (two) times daily.   fluticasone 50 MCG/ACT nasal spray Commonly known as:  FLONASE Place 2 sprays into both nostrils as needed.   levETIRAcetam 500 MG tablet Commonly known as:  KEPPRA 500 mg. 3 tabs  in am and 3 tabs in pm   multivitamin with minerals tablet Take 1 tablet by mouth daily.   NYSTATIN (TOPICAL) Powd Apply 1 application topically 2 (two) times daily.   sertraline 100 MG tablet Commonly known as:  ZOLOFT Take 100 mg by mouth daily. 2 tabs in the am   tamsulosin 0.4 MG Caps capsule Commonly known as:  FLOMAX Take 0.8 mg by mouth daily. 2 CAP = 0.8 MG   tobramycin-dexamethasone ophthalmic solution Commonly known as:  TOBRADEX   traMADol 50 MG tablet Commonly known as:  ULTRAM TAKE ONE TABLET BY MOUTH EVERY 6 HOURS AS NEEDED FOR PAIN   TYLENOL 500 MG tablet Generic drug:  acetaminophen Take 1,000 mg by mouth every 8 (eight) hours as needed.   Vitamin D3 1000 units Caps Take 2,000 Units by mouth 2 (  two) times daily after a meal.   VITAMIN K PO Take 100 mcg by mouth daily.   warfarin 2 MG tablet Commonly known as:  COUMADIN TAKE 2 TABLETS BY MOUTH ON THE FIRST DAY, THEN TAKE 1 TABLET THE SECOND DAY, REPEAT THIS PATTERN UNTIL DOCTOR CHANGES       Allergies: No Known Allergies  Past Medical History, Surgical history, Social history, and Family History were reviewed and updated.  Review of Systems: All other 10 point review of systems is negative.   Physical Exam:  weight is 205 lb 1.9 oz (93 kg). His oral temperature is 98.9 F (37.2 C). His blood pressure is 107/65 and his pulse is 72. His respiration is 18 and oxygen saturation is 94%.   Wt Readings from Last 3 Encounters:    09/10/16 205 lb 1.9 oz (93 kg)  08/14/16 204 lb 6.4 oz (92.7 kg)  07/04/16 200 lb 1.9 oz (90.8 kg)    Ocular: Sclerae unicteric, pupils equal, round and reactive to light Ear-nose-throat: Oropharynx clear, dentition fair Lymphatic: No cervical, supraclavicular or axillary adenopathy Lungs no rales or rhonchi, good excursion bilaterally Heart regular rate and rhythm, no murmur appreciated Abd soft, nontender, positive bowel sounds, no liver or spleen tip palpated on exam, no fluid wave  MSK no focal spinal tenderness, no joint edema Neuro: non-focal, well-oriented, appropriate affect Breasts: Deferred    Lab Results  Component Value Date   WBC 8.2 09/10/2016   HGB 13.8 09/10/2016   HCT 41.2 09/10/2016   MCV 91 09/10/2016   PLT 246 09/10/2016   Lab Results  Component Value Date   FERRITIN 62 08/20/2010   Lab Results  Component Value Date   RETICCTPCT 1.5 08/20/2010   RBC 4.53 09/10/2016   RETICCTABS 75.3 08/20/2010   No results found for: KPAFRELGTCHN, LAMBDASER, KAPLAMBRATIO No results found for: IGGSERUM, IGA, IGMSERUM No results found for: Odetta Pink, SPEI   Chemistry      Component Value Date/Time   NA 140 08/14/2016 1352   NA 138 05/09/2016 1321   K 3.6 08/14/2016 1352   K 4.3 05/09/2016 1321   CL 105 08/14/2016 1352   CO2 24 08/14/2016 1352   CO2 22 05/09/2016 1321   BUN 37 (H) 08/14/2016 1352   BUN 36.4 (H) 05/09/2016 1321   CREATININE 1.3 (H) 08/14/2016 1352   CREATININE 1.5 (H) 05/09/2016 1321      Component Value Date/Time   CALCIUM 9.0 08/14/2016 1352   CALCIUM 9.9 05/09/2016 1321   ALKPHOS 70 08/14/2016 1352   ALKPHOS 103 05/09/2016 1321   AST 31 08/14/2016 1352   AST 39 (H) 05/09/2016 1321   ALT 79 (H) 08/14/2016 1352   ALT 64 (H) 05/09/2016 1321   BILITOT 0.70 08/14/2016 1352   BILITOT 0.49 05/09/2016 1321      Impression and Plan: Mark [name] is a very pleasant 71 yo caucasian  gentleman with refractory immune thrombocytopenia. He had a thrombotic CVA with residual left sided weakness. He is doing well on Coumadin and INR is therapeutic at 2.8. He will continue on his same regimen.  We will plan to see him back in 3 weeks for repeat labs and follow-up. Both he and his wife know to call our office with any questions or concerns. We can certainly see her sooner if need be.   Eliezer Bottom, NP 5/29/20183:43 PM

## 2016-09-11 ENCOUNTER — Other Ambulatory Visit: Payer: Self-pay | Admitting: Hematology & Oncology

## 2016-09-12 ENCOUNTER — Other Ambulatory Visit: Payer: Self-pay | Admitting: Hematology & Oncology

## 2016-09-18 ENCOUNTER — Other Ambulatory Visit: Payer: Medicare HMO

## 2016-09-25 ENCOUNTER — Other Ambulatory Visit: Payer: Medicare Other

## 2016-09-27 DIAGNOSIS — R1313 Dysphagia, pharyngeal phase: Secondary | ICD-10-CM | POA: Diagnosis not present

## 2016-09-27 DIAGNOSIS — R05 Cough: Secondary | ICD-10-CM | POA: Diagnosis not present

## 2016-09-30 ENCOUNTER — Other Ambulatory Visit (HOSPITAL_COMMUNITY): Payer: Self-pay | Admitting: Gastroenterology

## 2016-09-30 DIAGNOSIS — R053 Chronic cough: Secondary | ICD-10-CM

## 2016-09-30 DIAGNOSIS — R05 Cough: Secondary | ICD-10-CM

## 2016-10-01 ENCOUNTER — Other Ambulatory Visit (HOSPITAL_COMMUNITY): Payer: Self-pay | Admitting: Gastroenterology

## 2016-10-01 DIAGNOSIS — R1319 Other dysphagia: Secondary | ICD-10-CM

## 2016-10-09 ENCOUNTER — Other Ambulatory Visit: Payer: Medicare HMO

## 2016-10-10 ENCOUNTER — Ambulatory Visit (HOSPITAL_COMMUNITY)
Admission: RE | Admit: 2016-10-10 | Discharge: 2016-10-10 | Disposition: A | Discharge status: Home / Self Care | Payer: Medicare HMO | Source: Ambulatory Visit | Attending: Gastroenterology | Admitting: Gastroenterology

## 2016-10-10 DIAGNOSIS — D693 Immune thrombocytopenic purpura: Secondary | ICD-10-CM

## 2016-10-10 DIAGNOSIS — R1319 Other dysphagia: Secondary | ICD-10-CM | POA: Insufficient documentation

## 2016-10-10 DIAGNOSIS — R531 Weakness: Secondary | ICD-10-CM

## 2016-10-10 DIAGNOSIS — G4733 Obstructive sleep apnea (adult) (pediatric): Secondary | ICD-10-CM | POA: Insufficient documentation

## 2016-10-10 DIAGNOSIS — R1312 Dysphagia, oropharyngeal phase: Secondary | ICD-10-CM

## 2016-10-10 DIAGNOSIS — R05 Cough: Secondary | ICD-10-CM | POA: Diagnosis not present

## 2016-10-10 DIAGNOSIS — I69354 Hemiplegia and hemiparesis following cerebral infarction affecting left non-dominant side: Secondary | ICD-10-CM

## 2016-10-10 DIAGNOSIS — I252 Old myocardial infarction: Secondary | ICD-10-CM

## 2016-10-10 DIAGNOSIS — I639 Cerebral infarction, unspecified: Secondary | ICD-10-CM | POA: Insufficient documentation

## 2016-10-10 DIAGNOSIS — R053 Chronic cough: Secondary | ICD-10-CM

## 2016-10-10 NOTE — Progress Notes (Signed)
Modified Barium Swallow Progress Note  Patient Details  Name: Mark [name] MRN: [id number] Date of Birth: May 27, 1945  Today's Date: 10/10/2016  Modified Barium Swallow completed.  Full report located under Chart Review in the Imaging Section.  Brief recommendations include the following:  Clinical Impression  Pt exhibits a mild sensorimotor oropharyngeal dysphagia marked by mild and intermittent premature spill due to decreased oral control and cohesion. Sensory deficits impacted initiation of swallow evidenced by intermittent delay to pyriform sinuses. Laryngeal motor weakness resulted in mildly decreased laryngeal elevation and epiglottic inversion with penetration unsensed but effective throat clearing with cues for. No laryneal intrusion while performing a chin tuck over repeated trials. Unremarkable oropharyngeal and esophageal (MBS does not diagnose esophageal deficits below UES) transit with pill whole in applesauce. No difficulty with multi-consistency texture although he is at greater risk given mild oral deficits. Recommend regular texture (use caution with dry, crumbly food, multi-consistency), thin liquids using chin tuck strategy, pills whole in applesauce. Did not disuss with pt however if symptoms are not improved by compensatory strategies and dysphagia worsens, he may benefit from outpatient ST for motor strengthening.           Swallow Evaluation Recommendations       SLP Diet Recommendations: Regular solids;Thin liquid   Liquid Administration via: Cup   Medication Administration: Whole meds with puree   Supervision: Patient able to self feed   Compensations: Slow rate;Small sips/bites;Chin tuck;Follow solids with liquid (chin tuck with thins)       Oral Care Recommendations: Oral care BID        Houston Siren 10/10/2016,4:23 PM   Orbie Pyo Colvin Caroli.Ed [employer] [phone]

## 2016-10-12 ENCOUNTER — Inpatient Hospital Stay (HOSPITAL_BASED_OUTPATIENT_CLINIC_OR_DEPARTMENT_OTHER)
Admission: EM | Admit: 2016-10-12 | Discharge: 2016-10-16 | DRG: 300 | Disposition: A | Discharge status: Home / Self Care | Payer: Medicare HMO | Attending: Internal Medicine | Admitting: Internal Medicine

## 2016-10-12 ENCOUNTER — Encounter (HOSPITAL_BASED_OUTPATIENT_CLINIC_OR_DEPARTMENT_OTHER): Payer: Self-pay | Admitting: *Deleted

## 2016-10-12 ENCOUNTER — Emergency Department (HOSPITAL_BASED_OUTPATIENT_CLINIC_OR_DEPARTMENT_OTHER): Payer: Medicare HMO

## 2016-10-12 DIAGNOSIS — D6862 Lupus anticoagulant syndrome: Secondary | ICD-10-CM | POA: Diagnosis not present

## 2016-10-12 DIAGNOSIS — Z7901 Long term (current) use of anticoagulants: Secondary | ICD-10-CM

## 2016-10-12 DIAGNOSIS — N390 Urinary tract infection, site not specified: Secondary | ICD-10-CM | POA: Diagnosis not present

## 2016-10-12 DIAGNOSIS — R05 Cough: Secondary | ICD-10-CM | POA: Diagnosis not present

## 2016-10-12 DIAGNOSIS — I82491 Acute embolism and thrombosis of other specified deep vein of right lower extremity: Secondary | ICD-10-CM | POA: Diagnosis not present

## 2016-10-12 DIAGNOSIS — Z86718 Personal history of other venous thrombosis and embolism: Secondary | ICD-10-CM | POA: Diagnosis not present

## 2016-10-12 DIAGNOSIS — N400 Enlarged prostate without lower urinary tract symptoms: Secondary | ICD-10-CM | POA: Diagnosis present

## 2016-10-12 DIAGNOSIS — I639 Cerebral infarction, unspecified: Secondary | ICD-10-CM | POA: Diagnosis present

## 2016-10-12 DIAGNOSIS — Z8673 Personal history of transient ischemic attack (TIA), and cerebral infarction without residual deficits: Secondary | ICD-10-CM | POA: Diagnosis not present

## 2016-10-12 DIAGNOSIS — G40909 Epilepsy, unspecified, not intractable, without status epilepticus: Secondary | ICD-10-CM | POA: Diagnosis present

## 2016-10-12 DIAGNOSIS — R296 Repeated falls: Secondary | ICD-10-CM | POA: Diagnosis present

## 2016-10-12 DIAGNOSIS — D696 Thrombocytopenia, unspecified: Secondary | ICD-10-CM | POA: Diagnosis not present

## 2016-10-12 DIAGNOSIS — Z9861 Coronary angioplasty status: Secondary | ICD-10-CM | POA: Diagnosis not present

## 2016-10-12 DIAGNOSIS — D693 Immune thrombocytopenic purpura: Secondary | ICD-10-CM | POA: Diagnosis not present

## 2016-10-12 DIAGNOSIS — I69354 Hemiplegia and hemiparesis following cerebral infarction affecting left non-dominant side: Secondary | ICD-10-CM | POA: Diagnosis not present

## 2016-10-12 DIAGNOSIS — N179 Acute kidney failure, unspecified: Secondary | ICD-10-CM

## 2016-10-12 DIAGNOSIS — R936 Abnormal findings on diagnostic imaging of limbs: Secondary | ICD-10-CM | POA: Diagnosis not present

## 2016-10-12 DIAGNOSIS — I10 Essential (primary) hypertension: Secondary | ICD-10-CM | POA: Diagnosis not present

## 2016-10-12 DIAGNOSIS — I82401 Acute embolism and thrombosis of unspecified deep veins of right lower extremity: Secondary | ICD-10-CM

## 2016-10-12 DIAGNOSIS — K219 Gastro-esophageal reflux disease without esophagitis: Secondary | ICD-10-CM | POA: Diagnosis present

## 2016-10-12 DIAGNOSIS — Z7982 Long term (current) use of aspirin: Secondary | ICD-10-CM

## 2016-10-12 DIAGNOSIS — R791 Abnormal coagulation profile: Secondary | ICD-10-CM

## 2016-10-12 DIAGNOSIS — R8271 Bacteriuria: Secondary | ICD-10-CM | POA: Diagnosis not present

## 2016-10-12 DIAGNOSIS — I82409 Acute embolism and thrombosis of unspecified deep veins of unspecified lower extremity: Secondary | ICD-10-CM | POA: Diagnosis present

## 2016-10-12 DIAGNOSIS — M7989 Other specified soft tissue disorders: Secondary | ICD-10-CM | POA: Diagnosis not present

## 2016-10-12 DIAGNOSIS — D72829 Elevated white blood cell count, unspecified: Secondary | ICD-10-CM | POA: Diagnosis not present

## 2016-10-12 DIAGNOSIS — R269 Unspecified abnormalities of gait and mobility: Secondary | ICD-10-CM | POA: Diagnosis not present

## 2016-10-12 DIAGNOSIS — I2510 Atherosclerotic heart disease of native coronary artery without angina pectoris: Secondary | ICD-10-CM | POA: Diagnosis not present

## 2016-10-12 DIAGNOSIS — G8194 Hemiplegia, unspecified affecting left nondominant side: Secondary | ICD-10-CM | POA: Diagnosis not present

## 2016-10-12 DIAGNOSIS — Z79899 Other long term (current) drug therapy: Secondary | ICD-10-CM | POA: Diagnosis not present

## 2016-10-12 DIAGNOSIS — I82431 Acute embolism and thrombosis of right popliteal vein: Secondary | ICD-10-CM | POA: Diagnosis not present

## 2016-10-12 DIAGNOSIS — M660 Rupture of popliteal cyst: Secondary | ICD-10-CM | POA: Diagnosis not present

## 2016-10-12 DIAGNOSIS — R509 Fever, unspecified: Secondary | ICD-10-CM

## 2016-10-12 DIAGNOSIS — Z87891 Personal history of nicotine dependence: Secondary | ICD-10-CM

## 2016-10-12 DIAGNOSIS — M25462 Effusion, left knee: Secondary | ICD-10-CM | POA: Diagnosis present

## 2016-10-12 DIAGNOSIS — R339 Retention of urine, unspecified: Secondary | ICD-10-CM | POA: Diagnosis present

## 2016-10-12 DIAGNOSIS — R338 Other retention of urine: Secondary | ICD-10-CM | POA: Diagnosis not present

## 2016-10-12 DIAGNOSIS — R710 Precipitous drop in hematocrit: Secondary | ICD-10-CM | POA: Diagnosis not present

## 2016-10-12 DIAGNOSIS — M79604 Pain in right leg: Secondary | ICD-10-CM

## 2016-10-12 DIAGNOSIS — F39 Unspecified mood [affective] disorder: Secondary | ICD-10-CM | POA: Diagnosis present

## 2016-10-12 HISTORY — DX: Atherosclerotic heart disease of native coronary artery without angina pectoris: I25.10

## 2016-10-12 HISTORY — DX: Immune thrombocytopenic purpura: D69.3

## 2016-10-12 HISTORY — DX: Multiple fractures of pelvis without disruption of pelvic ring, initial encounter for closed fracture: S32.82XA

## 2016-10-12 HISTORY — DX: Cerebral infarction, unspecified: I63.9

## 2016-10-12 HISTORY — DX: Acute embolism and thrombosis of unspecified deep veins of unspecified lower extremity: I82.409

## 2016-10-12 HISTORY — DX: Unspecified viral hepatitis B without hepatic coma: B19.10

## 2016-10-12 HISTORY — DX: Unspecified convulsions: R56.9

## 2016-10-12 HISTORY — DX: Essential (primary) hypertension: I10

## 2016-10-12 HISTORY — DX: Lupus anticoagulant syndrome: D68.62

## 2016-10-12 LAB — COMPREHENSIVE METABOLIC PANEL
ALBUMIN: 3.9 g/dL (ref 3.5–5.0)
ALT: 34 U/L (ref 17–63)
AST: 20 U/L (ref 15–41)
Alkaline Phosphatase: 56 U/L (ref 38–126)
Anion gap: 10 (ref 5–15)
BUN: 39 mg/dL — AB (ref 6–20)
CHLORIDE: 104 mmol/L (ref 101–111)
CO2: 24 mmol/L (ref 22–32)
Calcium: 9 mg/dL (ref 8.9–10.3)
Creatinine, Ser: 1.71 mg/dL — ABNORMAL HIGH (ref 0.61–1.24)
GFR calc Af Amer: 45 mL/min — ABNORMAL LOW (ref >60)
GFR calc non Af Amer: 38 mL/min — ABNORMAL LOW (ref >60)
GLUCOSE: 115 mg/dL — AB (ref 65–99)
POTASSIUM: 4.4 mmol/L (ref 3.5–5.1)
SODIUM: 138 mmol/L (ref 135–145)
Total Bilirubin: 0.7 mg/dL (ref 0.3–1.2)
Total Protein: 6.8 g/dL (ref 6.5–8.1)

## 2016-10-12 LAB — CBC
HEMATOCRIT: 33.8 % — AB (ref 39.0–52.0)
HEMOGLOBIN: 11.4 g/dL — AB (ref 13.0–17.0)
MCH: 29.5 pg (ref 26.0–34.0)
MCHC: 33.7 g/dL (ref 30.0–36.0)
MCV: 87.3 fL (ref 78.0–100.0)
Platelets: 120 10*3/uL — ABNORMAL LOW (ref 150–400)
RBC: 3.87 MIL/uL — AB (ref 4.22–5.81)
RDW: 14.6 % (ref 11.5–15.5)
WBC: 13 10*3/uL — ABNORMAL HIGH (ref 4.0–10.5)

## 2016-10-12 LAB — CBC WITH DIFFERENTIAL/PLATELET
Basophils Absolute: 0 10*3/uL (ref 0.0–0.1)
Basophils Relative: 0 %
EOS PCT: 3 %
Eosinophils Absolute: 0.4 10*3/uL (ref 0.0–0.7)
HCT: 35.9 % — ABNORMAL LOW (ref 39.0–52.0)
Hemoglobin: 12.3 g/dL — ABNORMAL LOW (ref 13.0–17.0)
LYMPHS ABS: 2.6 10*3/uL (ref 0.7–4.0)
LYMPHS PCT: 21 %
MCH: 30.6 pg (ref 26.0–34.0)
MCHC: 34.3 g/dL (ref 30.0–36.0)
MCV: 89.3 fL (ref 78.0–100.0)
MONO ABS: 2.1 10*3/uL — AB (ref 0.1–1.0)
Monocytes Relative: 17 %
Neutro Abs: 7.3 10*3/uL (ref 1.7–7.7)
Neutrophils Relative %: 59 %
PLATELETS: 130 10*3/uL — AB (ref 150–400)
RBC: 4.02 MIL/uL — AB (ref 4.22–5.81)
RDW: 14.8 % (ref 11.5–15.5)
WBC: 12.4 10*3/uL — AB (ref 4.0–10.5)

## 2016-10-12 LAB — PROTIME-INR
INR: 4.71
Prothrombin Time: 45.6 seconds — ABNORMAL HIGH (ref 11.4–15.2)

## 2016-10-12 LAB — URINALYSIS, MICROSCOPIC (REFLEX)

## 2016-10-12 LAB — URINALYSIS, ROUTINE W REFLEX MICROSCOPIC
BILIRUBIN URINE: NEGATIVE
GLUCOSE, UA: NEGATIVE mg/dL
KETONES UR: NEGATIVE mg/dL
LEUKOCYTES UA: NEGATIVE
Nitrite: NEGATIVE
PROTEIN: NEGATIVE mg/dL
Specific Gravity, Urine: 1.017 (ref 1.005–1.030)
pH: 6 (ref 5.0–8.0)

## 2016-10-12 LAB — I-STAT CG4 LACTIC ACID, ED: LACTIC ACID, VENOUS: 0.95 mmol/L (ref 0.5–1.9)

## 2016-10-12 MED ORDER — SODIUM CHLORIDE 0.9 % IV SOLN
INTRAVENOUS | Status: DC
  Administered 2016-10-13 (×3): via INTRAVENOUS
  Administered 2016-10-15: 100 mL/h via INTRAVENOUS
  Administered 2016-10-15: 04:00:00 via INTRAVENOUS

## 2016-10-12 MED ORDER — LEVETIRACETAM 500 MG PO TABS: 1500.0000 mg | ORAL_TABLET | Freq: Two times a day (BID) | ORAL | Status: DC

## 2016-10-12 MED ORDER — TAMSULOSIN HCL 0.4 MG PO CAPS
0.8000 mg | ORAL_CAPSULE | Freq: Every day | ORAL | Status: DC
  Administered 2016-10-12 – 2016-10-15 (×4): 0.8 mg via ORAL
  Filled 2016-10-12 (×4): qty 2

## 2016-10-12 MED ORDER — ACETAMINOPHEN 650 MG RE SUPP: 650.0000 mg | Freq: Four times a day (QID) | RECTAL | Status: DC | PRN

## 2016-10-12 MED ORDER — TRAMADOL HCL 50 MG PO TABS
50.0000 mg | ORAL_TABLET | Freq: Four times a day (QID) | ORAL | Status: DC | PRN
  Administered 2016-10-13 – 2016-10-16 (×3): 50 mg via ORAL
  Filled 2016-10-12 (×3): qty 1

## 2016-10-12 MED ORDER — BUPROPION HCL ER (XL) 150 MG PO TB24
150.0000 mg | ORAL_TABLET | Freq: Every day | ORAL | Status: DC
  Administered 2016-10-13 – 2016-10-16 (×4): 150 mg via ORAL
  Filled 2016-10-12 (×4): qty 1

## 2016-10-12 MED ORDER — ONDANSETRON HCL 4 MG PO TABS: 4.0000 mg | ORAL_TABLET | Freq: Four times a day (QID) | ORAL | Status: DC | PRN

## 2016-10-12 MED ORDER — FAMOTIDINE 20 MG PO TABS
40.0000 mg | ORAL_TABLET | Freq: Two times a day (BID) | ORAL | Status: DC
  Administered 2016-10-13 – 2016-10-16 (×8): 40 mg via ORAL
  Filled 2016-10-12 (×8): qty 2

## 2016-10-12 MED ORDER — SODIUM CHLORIDE 0.9 % IV BOLUS (SEPSIS)
1000.0000 mL | Freq: Once | INTRAVENOUS | Status: AC
  Administered 2016-10-12: 1000 mL via INTRAVENOUS

## 2016-10-12 MED ORDER — HEPARIN (PORCINE) IN NACL 100-0.45 UNIT/ML-% IJ SOLN
1100.0000 [IU]/h | INTRAMUSCULAR | Status: DC
  Administered 2016-10-12 – 2016-10-13 (×2): 1100 [IU]/h via INTRAVENOUS
  Filled 2016-10-12 (×3): qty 250

## 2016-10-12 MED ORDER — SERTRALINE HCL 100 MG PO TABS
200.0000 mg | ORAL_TABLET | Freq: Every day | ORAL | Status: DC
  Administered 2016-10-13 – 2016-10-16 (×4): 200 mg via ORAL
  Filled 2016-10-12 (×4): qty 2

## 2016-10-12 MED ORDER — ACETAMINOPHEN 325 MG PO TABS
650.0000 mg | ORAL_TABLET | Freq: Four times a day (QID) | ORAL | Status: DC | PRN
  Administered 2016-10-13 – 2016-10-16 (×5): 650 mg via ORAL
  Filled 2016-10-12 (×5): qty 2

## 2016-10-12 MED ORDER — ACETAMINOPHEN 325 MG PO TABS
650.0000 mg | ORAL_TABLET | Freq: Once | ORAL | Status: AC
  Administered 2016-10-12: 650 mg via ORAL
  Filled 2016-10-12: qty 2

## 2016-10-12 MED ORDER — ACETAMINOPHEN 500 MG PO TABS
1000.0000 mg | ORAL_TABLET | Freq: Once | ORAL | Status: AC
  Administered 2016-10-12: 1000 mg via ORAL
  Filled 2016-10-12: qty 2

## 2016-10-12 MED ORDER — LEVETIRACETAM 500 MG PO TABS
1500.0000 mg | ORAL_TABLET | Freq: Two times a day (BID) | ORAL | Status: DC
  Administered 2016-10-13 – 2016-10-16 (×8): 1500 mg via ORAL
  Filled 2016-10-12 (×8): qty 3

## 2016-10-12 MED ORDER — ROSUVASTATIN CALCIUM 20 MG PO TABS
20.0000 mg | ORAL_TABLET | Freq: Every day | ORAL | Status: DC
  Administered 2016-10-12 – 2016-10-15 (×4): 20 mg via ORAL
  Filled 2016-10-12 (×4): qty 1

## 2016-10-12 MED ORDER — PHYTONADIONE 5 MG PO TABS
2.5000 mg | ORAL_TABLET | Freq: Once | ORAL | Status: AC
  Administered 2016-10-12: 2.5 mg via ORAL
  Filled 2016-10-12: qty 1

## 2016-10-12 MED ORDER — AMLODIPINE BESYLATE 5 MG PO TABS
2.5000 mg | ORAL_TABLET | Freq: Every day | ORAL | Status: DC
  Administered 2016-10-12 – 2016-10-15 (×4): 2.5 mg via ORAL
  Filled 2016-10-12 (×4): qty 1

## 2016-10-12 MED ORDER — BETHANECHOL CHLORIDE 25 MG PO TABS
12.5000 mg | ORAL_TABLET | Freq: Two times a day (BID) | ORAL | Status: DC
  Administered 2016-10-13 – 2016-10-16 (×8): 12.5 mg via ORAL
  Filled 2016-10-12 (×9): qty 0.5

## 2016-10-12 MED ORDER — ONDANSETRON HCL 4 MG/2ML IJ SOLN: 4.0000 mg | Freq: Four times a day (QID) | INTRAMUSCULAR | Status: DC | PRN

## 2016-10-12 NOTE — ED Notes (Signed)
Pt currently in [place].

## 2016-10-12 NOTE — ED Provider Notes (Signed)
[facility] DEPT [facility] Provider Note   CSN: [id number] Arrival date & time: 10/12/16  1003     History   Chief Complaint Chief Complaint  Patient presents with  . Leg Pain    left    HPI Mark [name] is a 71 y.o. male.  HPI  71 year old male with a history of prior strokes with residual left-sided weakness, lupus anticoagulant disorder, will double prior DVTs, and chronic ITP presents with right calf pain and swelling concerning for a DVT. Patient states that he has had popliteal ecchymosis and pain for the past 4 days. The pain was worse today and the swelling in his calf seems to be worsening. Similar to prior DVTs. He is on warfarin for this as well as for prior strokes. He has been doing home exercises but does not remember injuring it. While being triaged he was noted to have a temperature 101.2. He did not know he had a fever and has not felt like he's had a fever. He does feel generalized weakness but he states this is a frequent occurrence with him due to his prior strokes. He has a chronic cough but no worsening cough. No headaches, shortness of breath, sore throat, rhinorrhea, chest pain, abdominal pain, nausea, vomiting, diarrhea. No urinary symptoms. No rash or redness to the skin.  Past Medical History:  Diagnosis Date  . Chronic ITP (idiopathic thrombocytopenia) (HCC)   . Coronary artery disease 1992   MI  . DVT (deep venous thrombosis) ([place])   . 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 Hypertension   . Lupus anticoagulant disorder ([place])   . Multiple closed anterior-posterior compression fractures of pelvis ([place])   . Seizures ([place])   . Stroke [facility]) 02/27/2011   Left side weakness    Patient Active Problem List   Diagnosis Date Noted  . DVT (deep venous thrombosis) ([place]) 10/12/2016  . Deep vein thrombosis (DVT) ([place]) 03/02/2015  . Lupus anticoagulant disorder ([place]) 03/02/2015  . Ischemic stroke ([place]) 10/04/2014  . 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 Deep vein thrombosis  ([place]) 10/04/2014  . 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 LA (lupus anticoagulant) disorder ([place]) 10/04/2014  . Arteriosclerosis of coronary artery 09/10/2012  . Apnea, sleep 09/10/2012  . 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 Dermatophytic onychia 01/29/2012  . Spastic hemiplegia ([place]) 12/03/2011  . Hemiparesis, left ([place]) 10/31/2011  . Dysphonia 08/13/2011  . Idiopathic thrombocytopenic purpura ([place]) 08/12/2011  . ITP (idiopathic thrombocytopenic purpura) 08/12/2011    Past Surgical History:  Procedure Laterality Date  . blood clot Right    surgical removal  . CHOLECYSTECTOMY    . CORONARY ANGIOPLASTY  1992  . SPLENECTOMY, TOTAL    . vertebralplasty         Home Medications    Prior to Admission medications   Medication Sig Start Date End Date Taking? Authorizing Provider  acetaminophen (TYLENOL) 500 MG tablet Take 1,000 mg by mouth every 8 (eight) hours as needed.   Yes [provider]  alendronate (FOSAMAX) 70 MG tablet Take 70 mg by mouth every 7 (seven) days. Take with a full glass of water on an empty stomach.    Yes [provider]  amLODipine (NORVASC) 2.5 MG tablet  02/21/15  Yes [provider]  aspirin 81 MG chewable tablet Chew by mouth at bedtime.   Yes [provider]  bethanechol (URECHOLINE) 25 MG tablet Take 12.5 mg by mouth 2 (two) times daily.  07/27/14  Yes [provider]  buPROPion (WELLBUTRIN XL) 150 MG 24 hr tablet  12/19/14  Yes [provider]  Cholecalciferol (VITAMIN D3) 1000 units CAPS Take 2,000 Units by mouth 2 (two) times daily after a meal.    Yes [provider]  CRESTOR 20 MG tablet Take by mouth daily.  11/16/14  Yes [provider]  dexamethasone (DECADRON) 4 MG tablet TAKE AS NEEDED ONLY. TAKE 10 TABLET (40MG ) BY MOUTH DAILY FOR 4 DAYS THEN STOP 12/19/15  Yes [place], Holli Humbles, NP  diphenhydrAMINE (BENADRYL) 25 MG tablet Take 25 mg by mouth every 6 (six) hours as  needed (may take 1 to 2 tabs).    Yes [provider]  famotidine (PEPCID) 40 MG tablet Take 40 mg by mouth 2 (two) times daily.  04/02/11  Yes [provider]  fluticasone (FLONASE) 50 MCG/ACT nasal spray Place 2 sprays into both nostrils as needed.  06/23/14  Yes [provider]  levETIRAcetam (KEPPRA) 500 MG tablet 500 mg. 3 tabs  in am and 3 tabs in pm 12/24/11  Yes [provider]  Multiple Vitamins-Minerals (MULTIVITAMIN WITH MINERALS) tablet Take 1 tablet by mouth 2 (two) times daily.    Yes [provider]  NYSTATIN, TOPICAL, POWD Apply 1 application topically 2 (two) times daily. 10/04/14  Yes [place], Holli Humbles, NP  sertraline (ZOLOFT) 100 MG tablet Take 100 mg by mouth daily. 2 tabs in the am   Yes [provider]  Tamsulosin HCl (FLOMAX) 0.4 MG CAPS Take 0.8 mg by mouth daily. 2 CAP = 0.8 MG   Yes [provider]  VITAMIN K PO Take 100 mcg by mouth daily.   Yes [provider]  warfarin (COUMADIN) 2 MG tablet TAKE 2 TABLETS BY MOUTH ON THE FIRST DAY, THEN TAKE 1 TABLET ON THE SECOND DAY, REPEAT THIS PATTERN UNTIL DR CHANGES. 09/11/16  Yes Volanda Napoleon, MD  warfarin (COUMADIN) 2 MG tablet TAKE 2 TABLETS BY MOUTH ON THE FIRST DAY, THEN TAKE 1 TABLET ON THE SECOND DAY, REPEAT THIS PATTERN UNTIL DR CHANGES. 09/12/16  Yes Volanda Napoleon, MD  tobramycin-dexamethasone [facility]) ophthalmic solution  08/13/16   [provider]  traMADol (ULTRAM) 50 MG tablet TAKE ONE TABLET BY MOUTH EVERY 6 HOURS AS NEEDED FOR PAIN 03/27/15   Volanda Napoleon, MD    Family History No family history on file.  Social History Social History  Substance Use Topics  . Smoking status: Former Smoker    Packs/day: 1.00    Years: 15.00    Types: Cigarettes    Start date: 06/12/1968    Quit date: 01/27/1985  . Smokeless tobacco: Never Used     Comment: quit smoking 28 yeras ago  . Alcohol use No     Allergies   Patient has no  known allergies.   Review of Systems Review of Systems  Constitutional: Positive for fatigue. Negative for fever.  Respiratory: Positive for cough. Negative for shortness of breath.   Cardiovascular: Positive for leg swelling. Negative for chest pain.  Gastrointestinal: Negative for abdominal pain, diarrhea, nausea and vomiting.       Left sided hernia that has not had pain  Genitourinary: Negative for dysuria.  Musculoskeletal: Positive for myalgias. Negative for back pain.  Neurological: Positive for weakness.  All other systems reviewed and are negative.    Physical Exam Updated Vital Signs BP 107/67   Pulse 69   Temp (!) 101.2 F (38.4 C) (Oral)  Resp 17   Ht 6' (1.829 m)   Wt 90.7 kg (200 lb)   SpO2 98%   BMI 27.12 kg/m   Physical Exam  Constitutional: He is oriented to person, place, and time. He appears well-developed and well-nourished. No distress.  HENT:  Head: Normocephalic and atraumatic.  Right Ear: External ear normal.  Left Ear: External ear normal.  Nose: Nose normal.  Mouth/Throat: Oropharynx is clear and moist.  Eyes: Right eye exhibits no discharge. Left eye exhibits no discharge.  Neck: Neck supple.  Cardiovascular: Normal rate, regular rhythm, normal heart sounds and intact distal pulses.   Pulmonary/Chest: Effort normal and breath sounds normal. He has no wheezes.  Abdominal: Soft. There is no tenderness. A hernia (LLQ hernia, easily reducible) is present.  Musculoskeletal:       Right knee: He exhibits normal range of motion, no effusion and no erythema. No tenderness found.       Right upper leg: He exhibits no tenderness and no swelling.       Right lower leg: He exhibits tenderness and swelling.       Legs: Neurological: He is alert and oriented to person, place, and time.  Skin: Skin is warm and dry. No rash noted. He is not diaphoretic. No erythema.  Nursing note and vitals reviewed.    ED Treatments / Results  Labs (all labs  ordered are listed, but only abnormal results are displayed) Labs Reviewed  COMPREHENSIVE METABOLIC PANEL - Abnormal; Notable for the following:       Result Value   Glucose, Bld 115 (*)    BUN 39 (*)    Creatinine, Ser 1.71 (*)    GFR calc non Af Amer 38 (*)    GFR calc Af Amer 45 (*)    All other components within normal limits  CBC WITH DIFFERENTIAL/PLATELET - Abnormal; Notable for the following:    WBC 12.4 (*)    RBC 4.02 (*)    Hemoglobin 12.3 (*)    HCT 35.9 (*)    Platelets 130 (*)    Monocytes Absolute 2.1 (*)    All other components within normal limits  URINALYSIS, ROUTINE W REFLEX MICROSCOPIC - Abnormal; Notable for the following:    APPearance CLOUDY (*)    Hgb urine dipstick LARGE (*)    All other components within normal limits  PROTIME-INR - Abnormal; Notable for the following:    Prothrombin Time 45.6 (*)    INR 4.71 (*)    All other components within normal limits  URINALYSIS, MICROSCOPIC (REFLEX) - Abnormal; Notable for the following:    Bacteria, UA MANY (*)    Squamous Epithelial / LPF 0-5 (*)    All other components within normal limits  CULTURE, BLOOD (ROUTINE X 2)  CULTURE, BLOOD (ROUTINE X 2)  URINE CULTURE  HEPARIN LEVEL (UNFRACTIONATED)  I-STAT CG4 LACTIC ACID, ED    EKG  EKG Interpretation None       Radiology Dg Chest 2 View  Result Date: 10/12/2016 CLINICAL DATA:  Cough and fever. EXAM: CHEST  2 VIEW COMPARISON:  None. FINDINGS: The heart size and mediastinal contours are within normal limits. Both lungs are clear. No evidence of pleural effusion. Several old right rib fracture deformities again noted. Old lower thoracic vertebral body compression fractures with previous vertebroplasties again noted . IMPRESSION: No active cardiopulmonary disease. Electronically Signed   By: Earle Gell M.D.   On: 10/12/2016 12:21   US Venous Img Lower Unilateral Right  Result Date: 10/12/2016 CLINICAL DATA:  Right leg pain and swelling EXAM: RIGHT  LOWER EXTREMITY VENOUS DOPPLER ULTRASOUND TECHNIQUE: Gray-scale sonography with graded compression, as well as color Doppler and duplex ultrasound were performed to evaluate the lower extremity deep venous systems from the level of the common femoral vein and including the common femoral, femoral, profunda femoral, popliteal and calf veins including the posterior tibial, peroneal and gastrocnemius veins when visible. The superficial great saphenous vein was also interrogated. Spectral Doppler was utilized to evaluate flow at rest and with distal augmentation maneuvers in the common femoral, femoral and popliteal veins. COMPARISON:  None. FINDINGS: Contralateral Common Femoral Vein: Respiratory phasicity is normal and symmetric with the symptomatic side. No evidence of thrombus. Normal compressibility. Common Femoral Vein: No evidence of thrombus. Normal compressibility, respiratory phasicity and response to augmentation. Saphenofemoral Junction: No evidence of thrombus. Normal compressibility and flow on color Doppler imaging. Profunda Femoral Vein: No evidence of thrombus. Normal compressibility and flow on color Doppler imaging. Femoral Vein: Linear echogenic material is present within the femoral vein throughout the thigh. The vessel remains largely compressible and there is color flow on color Doppler imaging. Findings may represent sequelae of remote or recanalized DVT. Popliteal Vein: The popliteal vein is not compressible but rather expanded with low-level internal echoes. No significant flow on color Doppler imaging. Findings are consistent with acute thrombus. Calf Veins: No evidence of thrombus. Normal compressibility and flow on color Doppler imaging. Superficial Great Saphenous Vein: No evidence of thrombus. Normal compressibility and flow on color Doppler imaging. Venous Reflux:  None. Other Findings: Large complex fluid collection originating in the popliteal space and extending posteriorly along the  entirety of the calf to the level of the ankle. IMPRESSION: 1. Positive for acute occlusive deep venous thrombosis in the popliteal vein. 2. Linear nonocclusive echogenic foci throughout the femoral vein in the thigh may represent the sequelae of a prior and now recanalized DVT. 3. Large complex fluid collection in the subcutaneous soft tissues beginning in the popliteal fossa and tracking inferiorly along the posterior calf to the level of the Achilles tendon. Differential considerations include hematoma, perhaps due to partial gastrocnemius tear, partial or complete tear of the Achilles tendon with superior retraction of the muscle, and potentially a complex ruptured Baker's cyst. Electronically Signed   By: Jacqulynn Cadet M.D.   On: 10/12/2016 13:24    Procedures Procedures (including critical care time)  Medications Ordered in ED Medications  heparin ADULT infusion 100 units/mL (25000 units/264mL sodium chloride 0.45%) (not administered)  acetaminophen (TYLENOL) tablet 650 mg (650 mg Oral Given 10/12/16 1114)  sodium chloride 0.9 % bolus 1,000 mL (0 mLs Intravenous Stopped 10/12/16 1347)     Initial Impression / Assessment and Plan / ED Course  I have reviewed the triage vital signs and the nursing notes.  Pertinent labs & imaging results that were available during my care of the patient were reviewed by me and considered in my medical decision making (see chart for details).     Patient was found to have a right popliteal DVT. Given that he has this DVT while on warfarin and being supratherapeutic, I believe he will need an IVC filter. I discussed with oncology, Dr. Jana Hakim, who recommends placing patient on heparin with pharmacy consultation and admitting for IVC filter, likely in 2 days (monday). Oddly he has a fever although he did not know that and does not have any symptoms. Unclear why. No obvious UTI or pneumonia. No focal  symptoms. Possibly from the DVT although no skin changes  were noticed besides ecchymosis. At this point he appears stable and does not have any signs or symptoms of PE. His initial blood pressure was borderline low but when rechecked in the room it is normal. It has remained normal. He will be admitted to the hospitalist at [facility] for further management and care. Dr. Maylene Roes is accepting.  Final Clinical Impressions(s) / ED Diagnoses   Final diagnoses:  Acute deep vein thrombosis (DVT) of popliteal vein of right lower extremity (HCC)  Supratherapeutic INR    New Prescriptions New Prescriptions   No medications on file     Sherwood Gambler, MD 10/12/16 [phone]

## 2016-10-12 NOTE — H&P (Signed)
History and Physical    WAI LITT BPZ:[id number] DOB: [birth year] DOA: 10/12/2016  PCP: Harlan Stains, MD  Heme/Onc: Ennever  Patient coming from: [facility]  Chief Complaint: Right calf pain and swelling  HPI: Mark [name] is a 71 y.o. gentleman with a history of positive lupus anticoagulant, CVA with left sided hemiparesis (though he is still able to ambulate with a cane at baseline), history of recurrent falls and gait instability, epilepsy (controlled on Keppra), CAD S/P remote angioplasty after MI (in the 90's), ITP, HTN, and recurrent DVT who presented to the ED in [facility] for evaluation of two days of progressive pain and swelling in his right calf.  He had chest tightness one week ago but denies chest pain or pleurisy currently.  No syncope or LOC.  No nausea, vomiting, or diarrhea.  Appetite has been normal.  ED Course: right lower extremity doppler shows acute DVT in the popliteal vein, probable sequelae of prior DVT, and a large complex fluid collection in the subcutaneous soft tissues that could represent hematoma, muscle tear, achilles tendon tear, or complex ruptured Baker's cyst.  The patient also had a fever to 101.2 in the ED.  Chest xray negative for acute process.  U/A shows many bacteria but negative nitrites and leukocytes.  Antibiotics have been deferred.  Blood and urine cultures pending.  Normal lactic acid.  INR 4.7.  WBC count 12.4.  BUN 39, creatinine 1.71.  Case discussed with Dr. Jana Hakim who recommended IV heparin and plans for IVC filter placement (likely on Monday).  Hospitalist asked to admit.  Review of Systems: Nocturia but denies dysuria or frequency.  Otherwise, 10 systems reviewed and negative except as stated in the HPI.   Past Medical History:  Diagnosis Date  . Chronic ITP (idiopathic thrombocytopenia) (HCC)   . Coronary artery disease 1992   MI  . DVT (deep venous thrombosis) ([place])   . 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 Hypertension   . Lupus  anticoagulant disorder ([place])   . Multiple closed anterior-posterior compression fractures of pelvis ([place])   . Seizures ([place])   . Stroke [facility]) 02/27/2011   Left side weakness    Past Surgical History:  Procedure Laterality Date  . blood clot Right    surgical removal  . CHOLECYSTECTOMY    . CORONARY ANGIOPLASTY  1992  . SPLENECTOMY, TOTAL    . vertebralplasty       reports that he quit smoking about 31 years ago. His smoking use included Cigarettes. He started smoking about 48 years ago. He has a 15.00 pack-year smoking history. He has never used smokeless tobacco. He reports that he does not drink alcohol. His drug history is not on file.  He is married.  He has two adult children.  No Known Allergies  FAMILY HISTORY: Denies any known clotting disorders in other relatives.  Prior to Admission medications   Medication Sig Start Date End Date Taking? Authorizing Provider  acetaminophen (TYLENOL) 500 MG tablet Take 1,000 mg by mouth every 8 (eight) hours as needed.   Yes [provider]  alendronate (FOSAMAX) 70 MG tablet Take 70 mg by mouth every 7 (seven) days. Take with a full glass of water on an empty stomach.    Yes [provider]  amLODipine (NORVASC) 2.5 MG tablet  02/21/15  Yes [provider]  aspirin 81 MG chewable tablet Chew by mouth at bedtime.   Yes [provider]  bethanechol (URECHOLINE) 25 MG tablet  Take 12.5 mg by mouth 2 (two) times daily.  07/27/14  Yes [provider]  buPROPion (WELLBUTRIN XL) 150 MG 24 hr tablet  12/19/14  Yes [provider]  Cholecalciferol (VITAMIN D3) 1000 units CAPS Take 2,000 Units by mouth 2 (two) times daily after a meal.    Yes [provider]  CRESTOR 20 MG tablet Take by mouth daily.  11/16/14  Yes [provider]  dexamethasone (DECADRON) 4 MG tablet TAKE AS NEEDED ONLY. TAKE 10 TABLET (40MG ) BY MOUTH DAILY FOR 4 DAYS THEN STOP 12/19/15  Yes [place], Holli Humbles, NP    diphenhydrAMINE (BENADRYL) 25 MG tablet Take 25 mg by mouth every 6 (six) hours as needed (may take 1 to 2 tabs).    Yes [provider]  famotidine (PEPCID) 40 MG tablet Take 40 mg by mouth 2 (two) times daily.  04/02/11  Yes [provider]  fluticasone (FLONASE) 50 MCG/ACT nasal spray Place 2 sprays into both nostrils as needed.  06/23/14  Yes [provider]  levETIRAcetam (KEPPRA) 500 MG tablet 500 mg. 3 tabs  in am and 3 tabs in pm 12/24/11  Yes [provider]  Multiple Vitamins-Minerals (MULTIVITAMIN WITH MINERALS) tablet Take 1 tablet by mouth 2 (two) times daily.    Yes [provider]  NYSTATIN, TOPICAL, POWD Apply 1 application topically 2 (two) times daily. 10/04/14  Yes [place], Holli Humbles, NP  sertraline (ZOLOFT) 100 MG tablet Take 100 mg by mouth daily. 2 tabs in the am   Yes [provider]  Tamsulosin HCl (FLOMAX) 0.4 MG CAPS Take 0.8 mg by mouth daily. 2 CAP = 0.8 MG   Yes [provider]  VITAMIN K PO Take 100 mcg by mouth daily.   Yes [provider]  warfarin (COUMADIN) 2 MG tablet TAKE 2 TABLETS BY MOUTH ON THE FIRST DAY, THEN TAKE 1 TABLET ON THE SECOND DAY, REPEAT THIS PATTERN UNTIL DR CHANGES. 09/11/16  Yes Volanda Napoleon, MD  warfarin (COUMADIN) 2 MG tablet TAKE 2 TABLETS BY MOUTH ON THE FIRST DAY, THEN TAKE 1 TABLET ON THE SECOND DAY, REPEAT THIS PATTERN UNTIL DR CHANGES. 09/12/16  Yes Volanda Napoleon, MD    Physical Exam: Vitals:   10/12/16 1630 10/12/16 1645 10/12/16 1651 10/12/16 1853  BP: 132/77  135/74 117/68  Pulse:   68 74  Resp: 20 17 18 18   Temp:   98.5 F (36.9 C) 98.9 F (37.2 C)  TempSrc:   Tympanic Oral  SpO2:   98% 99%  Weight:      Height:          Constitutional: NAD, calm, comfortable but seems anxious about procedure Vitals:   10/12/16 1630 10/12/16 1645 10/12/16 1651 10/12/16 1853  BP: 132/77  135/74 117/68  Pulse:   68 74  Resp: 20 17 18 18   Temp:   98.5 F (36.9  C) 98.9 F (37.2 C)  TempSrc:   Tympanic Oral  SpO2:   98% 99%  Weight:      Height:       Eyes: PERRL, lids and conjunctivae normal ENMT: Mucous membranes are moist. Posterior pharynx clear of any exudate or lesions. Normal dentition.  Neck: normal appearance, supple, no masses Respiratory: clear to auscultation listening anteriorly.  No wheezing.  Normal respiratory effort. No accessory muscle use.  Cardiovascular: Normal rate, regular rhythm, no murmurs / rubs / gallops.  Right lower extremity edema.  2+ pedal pulses. GI: abdomen  is soft and compressible.  No distention.  No tenderness.  Bowel sounds are present. Musculoskeletal:  No joint deformity in upper and lower extremities. Left sided hemiparesis.  Good ROM on the right.  No contractures. Normal muscle on the right.  Left arm is flaccid. Skin: no rashes, pale, dry, chronic stasis changes in bilateral lower extremities. Neurologic: Face symmetric, tongue midline.  Sensation intact.  No new focal deficits. Psychiatric: Normal judgment and insight. Alert and oriented x 3. Normal mood.     Labs on Admission: I have personally reviewed following labs and imaging studies  CBC:  Recent Labs Lab 10/12/16 1125  WBC 12.4*  NEUTROABS 7.3  HGB 12.3*  HCT 35.9*  MCV 89.3  PLT 768*   Basic Metabolic Panel:  Recent Labs Lab 10/12/16 1125  NA 138  K 4.4  CL 104  CO2 24  GLUCOSE 115*  BUN 39*  CREATININE 1.71*  CALCIUM 9.0   GFR: Estimated Creatinine Clearance: 43.5 mL/min (A) (by C-G formula based on SCr of 1.71 mg/dL (H)). Liver Function Tests:  Recent Labs Lab 10/12/16 1125  AST 20  ALT 34  ALKPHOS 56  BILITOT 0.7  PROT 6.8  ALBUMIN 3.9   Coagulation Profile:  Recent Labs Lab 10/12/16 1125  INR 4.71*   Urine analysis:    Component Value Date/Time   COLORURINE YELLOW 10/12/2016 1405   APPEARANCEUR CLOUDY (A) 10/12/2016 1405   LABSPEC 1.017 10/12/2016 1405   PHURINE 6.0 10/12/2016 1405   GLUCOSEU  NEGATIVE 10/12/2016 1405   HGBUR LARGE (A) 10/12/2016 1405   BILIRUBINUR NEGATIVE 10/12/2016 1405   KETONESUR NEGATIVE 10/12/2016 1405   PROTEINUR NEGATIVE 10/12/2016 1405   UROBILINOGEN 1.0 04/04/2010 0110   NITRITE NEGATIVE 10/12/2016 1405   LEUKOCYTESUR NEGATIVE 10/12/2016 1405   Sepsis Labs:  Lactic acid level 0.95  Radiological Exams on Admission: Dg Chest 2 View  Result Date: 10/12/2016 CLINICAL DATA:  Cough and fever. EXAM: CHEST  2 VIEW COMPARISON:  None. FINDINGS: The heart size and mediastinal contours are within normal limits. Both lungs are clear. No evidence of pleural effusion. Several old right rib fracture deformities again noted. Old lower thoracic vertebral body compression fractures with previous vertebroplasties again noted . IMPRESSION: No active cardiopulmonary disease. Electronically Signed   By: Earle Gell M.D.   On: 10/12/2016 12:21   US Venous Img Lower Unilateral Right  Result Date: 10/12/2016 CLINICAL DATA:  Right leg pain and swelling EXAM: RIGHT LOWER EXTREMITY VENOUS DOPPLER ULTRASOUND TECHNIQUE: Gray-scale sonography with graded compression, as well as color Doppler and duplex ultrasound were performed to evaluate the lower extremity deep venous systems from the level of the common femoral vein and including the common femoral, femoral, profunda femoral, popliteal and calf veins including the posterior tibial, peroneal and gastrocnemius veins when visible. The superficial great saphenous vein was also interrogated. Spectral Doppler was utilized to evaluate flow at rest and with distal augmentation maneuvers in the common femoral, femoral and popliteal veins. COMPARISON:  None. FINDINGS: Contralateral Common Femoral Vein: Respiratory phasicity is normal and symmetric with the symptomatic side. No evidence of thrombus. Normal compressibility. Common Femoral Vein: No evidence of thrombus. Normal compressibility, respiratory phasicity and response to augmentation.  Saphenofemoral Junction: No evidence of thrombus. Normal compressibility and flow on color Doppler imaging. Profunda Femoral Vein: No evidence of thrombus. Normal compressibility and flow on color Doppler imaging. Femoral Vein: Linear echogenic material is present within the femoral vein throughout the thigh. The vessel remains largely compressible  and there is color flow on color Doppler imaging. Findings may represent sequelae of remote or recanalized DVT. Popliteal Vein: The popliteal vein is not compressible but rather expanded with low-level internal echoes. No significant flow on color Doppler imaging. Findings are consistent with acute thrombus. Calf Veins: No evidence of thrombus. Normal compressibility and flow on color Doppler imaging. Superficial Great Saphenous Vein: No evidence of thrombus. Normal compressibility and flow on color Doppler imaging. Venous Reflux:  None. Other Findings: Large complex fluid collection originating in the popliteal space and extending posteriorly along the entirety of the calf to the level of the ankle. IMPRESSION: 1. Positive for acute occlusive deep venous thrombosis in the popliteal vein. 2. Linear nonocclusive echogenic foci throughout the femoral vein in the thigh may represent the sequelae of a prior and now recanalized DVT. 3. Large complex fluid collection in the subcutaneous soft tissues beginning in the popliteal fossa and tracking inferiorly along the posterior calf to the level of the Achilles tendon. Differential considerations include hematoma, perhaps due to partial gastrocnemius tear, partial or complete tear of the Achilles tendon with superior retraction of the muscle, and potentially a complex ruptured Baker's cyst. Electronically Signed   By: Jacqulynn Cadet M.D.   On: 10/12/2016 13:24   Assessment/Plan Principal Problem:   DVT (deep venous thrombosis) (HCC) Active Problems:   Idiopathic thrombocytopenic purpura (HCC)   Ischemic stroke (HCC)    Hemiparesis, left (HCC)   LA (lupus anticoagulant) disorder (HCC)   Supratherapeutic INR   Acute deep vein thrombosis (DVT) of popliteal vein of right lower extremity (HCC)   AKI (acute kidney injury) (HCC)   Fever   Leukocytosis   Bacteriuria      Acute DVT with history of positive lupus anticoagulant --IV heparin, HOLD warfarin --Will discuss vitamin K dosing with Dr. Jana Hakim --Anticipate IVC filter on Monday  Abnormal fluid collection on ultrasound --Will discuss follow-up imaging with ortho --If hematoma, may need to give more urgent consideration to INR reversal --He takes a low dose of vitamin K daily at baseline but still comes in with warfarin toxicity  AKI --Etiology unclear --Seems to have asymptomatic bacteriuria at this point --Hydrate with NS --Repeat BMP in the AM  HTN --Amlodipine  History of CVA --HOLD aspirin for now since he is on IV heparin and has supratherapeutic INR --Statin  BPH --Flomax, bethanechol  Mood disorder --Wellbutrin, Zoloft  GERD --Pepcid  History of ITP --Chronic thrombocytopenia  Fever --Could be related to the DVT.  Monitor. --No obvious infection; he is not septic.   DVT prophylaxis: Anticoagulated with IV heparin Code Status: FULL Family Communication: Daughter present in the room at time of admission. Disposition Plan: Expect he will go home when ready for discharge. Consults called: Hematology/Oncology Admission status: Inpatient, med surg.  I expect this patient will need inpatient services for greater than two midnights.   TIME SPENT: 60 minutes   Eber Jones MD [facility] Pager [phone]  If 7PM-7AM, please contact night-coverage [email or web address] Password [facility]  10/12/2016, 7:57 PM

## 2016-10-12 NOTE — ED Notes (Signed)
Assumed care of patient from [place], [place]. Pt resting quietly. No distress. Awaiting test results. Pt encouraged to provide UA. Urinal given.

## 2016-10-12 NOTE — ED Notes (Signed)
Pt in wheelchair. 

## 2016-10-12 NOTE — Progress Notes (Signed)
ANTICOAGULATION CONSULT NOTE - Initial Consult  Pharmacy Consult for heparin Indication: VTE treatment  No Known Allergies  Patient Measurements: Height: 6' (182.9 cm) Weight: 200 lb (90.7 kg) IBW/kg (Calculated) : 77.6 Heparin Dosing Weight: 90  Vital Signs: Temp: 101.2 F (38.4 C) (06/30 1011) Temp Source: Oral (06/30 1011) BP: 114/68 (06/30 1430) Pulse Rate: 70 (06/30 1430)  Labs:  Recent Labs  10/12/16 1125  HGB 12.3*  HCT 35.9*  PLT 130*  LABPROT 45.6*  INR 4.71*  CREATININE 1.71*    Estimated Creatinine Clearance: 43.5 mL/min (A) (by C-G formula based on SCr of 1.71 mg/dL (H)).   Medical History: Past Medical History:  Diagnosis Date  . Chronic ITP (idiopathic thrombocytopenia) (HCC)   . Coronary artery disease 1992   MI  . DVT (deep venous thrombosis) ([place])   . 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 Hypertension   . Lupus anticoagulant disorder ([place])   . Multiple closed anterior-posterior compression fractures of pelvis ([place])   . Seizures ([place])   . Stroke ([place]) 02/27/2011   Left side weakness    Medications:  Scheduled:   Assessment: 71yo male with hx DVT and strokes, presenting with pain in L-popliteal area & radiation to calf.  Pt is on Coumadin at home with INR 4.71 today, up from 2.8 on 5/29.  Discussed starting heparin with DrGoldston due to INR, who stated he had spoken with Hematology and was advised to start heparin; plan is for an IVC filter.  Pt has not been on antibiotics or new medications per DrGoldston's d/w patient.  Goal of Therapy:  Heparin level 0.3-4 units/ml Monitor platelets by anticoagulation protocol: Yes   Plan:  No bolus due to INR Heparin 1100 units/hr Heparin level in 8hr Daily heparin level, CBC Consider Vitamin K if INR increases Pt will be admitted to [facility] PharmD Clinical Pharmacist [facility]

## 2016-10-12 NOTE — Progress Notes (Signed)
RLE ultrasound report reviewed.  Abnormal fluid collection documented with differential of hematoma, ruptured cyst, muscle/tendon tear.  Plan for anticoagulation discussed again with Dr. Jana Hakim.  Will give vitamin K 2.5mg  po now.  Will reduce heparin to a rate of 500units/hr; NO titration until INR is subtherapeutic.  Case discussed briefly with ortho on call re: follow-up imaging.  They agree with MRI with contrast.  Ortho willing to provide consultation if/when needed.  Unfortunately, we cannot get the MRI until Monday.  Will follow hgb and hemodynamics.  This is a complicated patient who is at risk for both clotting and bleeding.  Update given to patient and family.  He is agreeable to MRI and understands the indication.

## 2016-10-12 NOTE — ED Triage Notes (Signed)
Patient and spouse states two days ago he developed pain in the  left popliteal area with radiation into left calf.  Has a bruise in the popliteal area from unknown injury.  Last night at 2200 he developed generalized malaise and worsening pain in the left leg.  History of DVT and strokes.

## 2016-10-12 NOTE — ED Notes (Signed)
Delay in X-ray/US, RN w/ pt.

## 2016-10-12 NOTE — Progress Notes (Signed)
Pharmacy - heparin infusion  I spoke with Dr. Eulas Post who discussed the plan with Dr. Jana Hakim: due to the elevated INR and fluid collection seen on CT(hematoma?), he wants to give Vit.K and reduce the heparin infusion to 500units/hr and not titrated based on levels until instructed otherwise or the INR is <2.  Reduce heparin to 500units/hr. Next heparin level in AM. No titration.  Romeo Rabon, PharmD, pager [phone]. 10/12/2016,8:49 PM.

## 2016-10-13 DIAGNOSIS — I639 Cerebral infarction, unspecified: Secondary | ICD-10-CM

## 2016-10-13 DIAGNOSIS — I82401 Acute embolism and thrombosis of unspecified deep veins of right lower extremity: Secondary | ICD-10-CM

## 2016-10-13 DIAGNOSIS — R338 Other retention of urine: Secondary | ICD-10-CM

## 2016-10-13 DIAGNOSIS — Z8673 Personal history of transient ischemic attack (TIA), and cerebral infarction without residual deficits: Secondary | ICD-10-CM

## 2016-10-13 DIAGNOSIS — Z86718 Personal history of other venous thrombosis and embolism: Secondary | ICD-10-CM

## 2016-10-13 DIAGNOSIS — N390 Urinary tract infection, site not specified: Secondary | ICD-10-CM

## 2016-10-13 LAB — URINE CULTURE: CULTURE: NO GROWTH

## 2016-10-13 LAB — CBC
HCT: 32.3 % — ABNORMAL LOW (ref 39.0–52.0)
Hemoglobin: 10.7 g/dL — ABNORMAL LOW (ref 13.0–17.0)
MCH: 28.8 pg (ref 26.0–34.0)
MCHC: 33.1 g/dL (ref 30.0–36.0)
MCV: 86.8 fL (ref 78.0–100.0)
PLATELETS: 104 10*3/uL — AB (ref 150–400)
RBC: 3.72 MIL/uL — AB (ref 4.22–5.81)
RDW: 14.5 % (ref 11.5–15.5)
WBC: 12.2 10*3/uL — ABNORMAL HIGH (ref 4.0–10.5)

## 2016-10-13 LAB — BASIC METABOLIC PANEL
ANION GAP: 7 (ref 5–15)
BUN: 29 mg/dL — ABNORMAL HIGH (ref 6–20)
CALCIUM: 8.6 mg/dL — AB (ref 8.9–10.3)
CO2: 24 mmol/L (ref 22–32)
CREATININE: 1.32 mg/dL — AB (ref 0.61–1.24)
Chloride: 109 mmol/L (ref 101–111)
GFR, EST NON AFRICAN AMERICAN: 53 mL/min — AB (ref >60)
Glucose, Bld: 95 mg/dL (ref 65–99)
Potassium: 4.2 mmol/L (ref 3.5–5.1)
Sodium: 140 mmol/L (ref 135–145)

## 2016-10-13 LAB — PROTIME-INR
INR: 3.54
INR: 2.1
PROTHROMBIN TIME: 23.9 s — AB (ref 11.4–15.2)
Prothrombin Time: 36.3 seconds — ABNORMAL HIGH (ref 11.4–15.2)

## 2016-10-13 LAB — HEPARIN LEVEL (UNFRACTIONATED): Heparin Unfractionated: <0.1 IU/mL — ABNORMAL LOW (ref 0.30–0.70)

## 2016-10-13 MED ORDER — PHYTONADIONE 5 MG PO TABS
2.5000 mg | ORAL_TABLET | Freq: Once | ORAL | Status: AC
  Administered 2016-10-13: 2.5 mg via ORAL
  Filled 2016-10-13: qty 1

## 2016-10-13 MED ORDER — DEXTROSE 5 % IV SOLN
1.0000 g | INTRAVENOUS | Status: DC
  Administered 2016-10-13: 1 g via INTRAVENOUS
  Filled 2016-10-13 (×2): qty 10

## 2016-10-13 NOTE — Consult Note (Signed)
[place]  [phone] [phone]     ID: Mark [name] DOB: 06-Jan-1946  Mark#: [id number]  OIZ#:[id number]  Patient Care Team: Harlan Stains, MD as PCP - General (Family Medicine) Marin Olp Rudell Cobb, MD as Consulting Physician (Oncology) Chauncey Cruel, MD OTHER MD:  CHIEF COMPLAINT: coagulopathy  CURRENT TREATMENT: vit K, IV heparin   HISTORY OF CURRENT ILLNESS: Mark [name] is a 71 y/o [place] man followed by Dr Marin Olp for a history of ITP, mild, and a positive lupus anticoagulant, with a history of L body stroke, DVT of the LLE and RLE in past. The patient has been on coumadin for anticoagulation.  On Tuesday 06/24 he started some excercises to strengthen his R LE, "my main walking leg." He noticed some pain but "thought that was a good ting." The pain however grew worse over the next few days and became throbbing, reminding him of the pain he experienced with his prior DVTs. Accordingly he went to the [place] in [facility] 10/12/3016 where Doppler US showed acute occlusive thrombus in the Right popliteal v as well as a large complex fluid collection from the popliteal fossa. We were contacted and the need for IVC filter placement discussed. The patient was admitted, started on IV heparin, and coumadin reversal was initiated.  The patient's subsequent history is as detailed below.  INTERVAL HISTORY: I met with the patient and his daughter in his hospital room 10/13/2016  REVIEW OF SYSTEMS: Currently he is pain free. He admits to frequent bruising, but no overt bleeding and specifically no epuistaxis, hematochezia, BRBPR or melena. Denies cough, SOB, pleurisy. A detailed ROS is otherwise stable per his report  PAST MEDICAL HISTORY: Past Medical History:  Diagnosis Date  . Chronic ITP (idiopathic thrombocytopenia) (HCC)   . Coronary artery disease 1992   MI  . DVT (deep venous thrombosis) ([place])   . 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 Hypertension     . Lupus anticoagulant disorder ([place])   . Multiple closed anterior-posterior compression fractures of pelvis ([place])   . Seizures ([place])   . Stroke ([place]) 02/27/2011   Left side weakness    PAST SURGICAL HISTORY: Past Surgical History:  Procedure Laterality Date  . blood clot Right    surgical removal  . CHOLECYSTECTOMY    . CORONARY ANGIOPLASTY  1992  . SPLENECTOMY, TOTAL    . vertebralplasty      HEALTH MAINTENANCE: Social History  Substance Use Topics  . Smoking status: Former Smoker    Packs/day: 1.00    Years: 15.00    Types: Cigarettes    Start date: 06/12/1968    Quit date: 01/27/1985  . Smokeless tobacco: Never Used     Comment: quit smoking 28 yeras ago  . Alcohol use No    No Known Allergies  Current Facility-Administered Medications  Medication Dose Route Frequency Provider Last Rate Last Dose  . 0.9 %  sodium chloride infusion   Intravenous Continuous Lily Kocher, MD 100 mL/hr at 10/13/16 1024    . acetaminophen (TYLENOL) tablet 650 mg  650 mg Oral Q6H PRN Lily Kocher, MD   650 mg at 10/13/16 3382   Or  . acetaminophen (TYLENOL) suppository 650 mg  650 mg Rectal Q6H PRN Lily Kocher, MD      . amLODipine (NORVASC) tablet 2.5 mg  2.5 mg Oral QHS Lily Kocher, MD   2.5 mg at 10/12/16 2226  . bethanechol (URECHOLINE) tablet 12.5 mg  12.5 mg Oral  BID Lily Kocher, MD   12.5 mg at 10/13/16 0800  . buPROPion (WELLBUTRIN XL) 24 hr tablet 150 mg  150 mg Oral Daily Lily Kocher, MD   150 mg at 10/13/16 1017  . famotidine (PEPCID) tablet 40 mg  40 mg Oral BID Lily Kocher, MD   40 mg at 10/13/16 1017  . heparin ADULT infusion 100 units/mL (25000 units/248m sodium chloride 0.45%)  500 Units/hr Intravenous Continuous CLily Kocher MD 5 mL/hr at 10/12/16 2218 500 Units/hr at 10/12/16 2218  . levETIRAcetam (KEPPRA) tablet 1,500 mg  1,500 mg Oral BID CLily Kocher MD   1,500 mg at 10/13/16 1017  . ondansetron (ZOFRAN) tablet 4 mg  4 mg Oral Q6H PRN CLily Kocher MD        Or  . ondansetron ([facility] injection 4 mg  4 mg Intravenous Q6H PRN CLily Kocher MD      . rosuvastatin (CRESTOR) tablet 20 mg  20 mg Oral QHS CLily Kocher MD   20 mg at 10/12/16 2226  . sertraline (ZOLOFT) tablet 200 mg  200 mg Oral Daily CLily Kocher MD   200 mg at 10/13/16 1017  . tamsulosin (FLOMAX) capsule 0.8 mg  0.8 mg Oral QHS CLily Kocher MD   0.8 mg at 10/12/16 2226  . traMADol (ULTRAM) tablet 50 mg  50 mg Oral Q6H PRN CLily Kocher MD        OBJECTIVE: middle aged White man examined in bed  Vitals:   10/12/16 2226 10/13/16 0555  BP: 128/70 125/69  Pulse:  71  Resp:  20  Temp:  98.6 F (37 C)     Body mass index is 27.12 kg/m.  Filed Weights   10/12/16 1012  Weight: 200 lb (90.7 kg)   Sclerae unicteric, EOMs intact Oropharynx clear and moist No cervical or supraclavicular adenopathy Lungs no rales or rhonchi, auscultated anterolaterally Heart regular rate and rhythm Abd soft, nontender, positive bowel sounds MSK no LE lymphedema Neuro: on Left minimal grip, can move arm from shoulder only; left LE 4/5; well oriented, appropriate affect  LAB RESULTS:  CMP     Component Value Date/Time   NA 140 10/13/2016 0449   NA 140 08/14/2016 1352   NA 138 05/09/2016 1321   K 4.2 10/13/2016 0449   K 3.6 08/14/2016 1352   K 4.3 05/09/2016 1321   CL 109 10/13/2016 0449   CL 105 08/14/2016 1352   CO2 24 10/13/2016 0449   CO2 24 08/14/2016 1352   CO2 22 05/09/2016 1321   GLUCOSE 95 10/13/2016 0449   GLUCOSE 122 (H) 08/14/2016 1352   BUN 29 (H) 10/13/2016 0449   BUN 37 (H) 08/14/2016 1352   BUN 36.4 (H) 05/09/2016 1321   CREATININE 1.32 (H) 10/13/2016 0449   CREATININE 1.3 (H) 08/14/2016 1352   CREATININE 1.5 (H) 05/09/2016 1321   CALCIUM 8.6 (L) 10/13/2016 0449   CALCIUM 9.0 08/14/2016 1352   CALCIUM 9.9 05/09/2016 1321   PROT 6.8 10/12/2016 1125   PROT 6.6 08/14/2016 1352   PROT 8.2 05/09/2016 1321   ALBUMIN 3.9 10/12/2016 1125   ALBUMIN 3.4  08/14/2016 1352   ALBUMIN 4.6 06/05/2016 1439   ALBUMIN 4.5 05/09/2016 1321   AST 20 10/12/2016 1125   AST 31 08/14/2016 1352   AST 39 (H) 05/09/2016 1321   ALT 34 10/12/2016 1125   ALT 79 (H) 08/14/2016 1352   ALT 64 (H) 05/09/2016 1321   ALKPHOS 56 10/12/2016 1125   ALKPHOS 70  08/14/2016 1352   ALKPHOS 103 05/09/2016 1321   BILITOT 0.7 10/12/2016 1125   BILITOT 0.70 08/14/2016 1352   BILITOT 0.49 05/09/2016 1321   GFRNONAA 53 (L) 10/13/2016 0449   GFRAA >60 10/13/2016 0449    No results found for: TOTALPROTELP, ALBUMINELP, A1GS, A2GS, BETS, BETA2SER, GAMS, MSPIKE, SPEI  No results found for: KPAFRELGTCHN, LAMBDASER, KAPLAMBRATIO  Lab Results  Component Value Date   WBC 12.2 (H) 10/13/2016   NEUTROABS 7.3 10/12/2016   HGB 10.7 (L) 10/13/2016   HCT 32.3 (L) 10/13/2016   MCV 86.8 10/13/2016   PLT 104 (L) 10/13/2016    _0 @  No results found for: LABCA2  No components found for: [id number]   Recent Labs Lab 10/13/16 0449  INR 3.54    Urinalysis    Component Value Date/Time   COLORURINE YELLOW 10/12/2016 1405   APPEARANCEUR CLOUDY (A) 10/12/2016 1405   LABSPEC 1.017 10/12/2016 1405   PHURINE 6.0 10/12/2016 1405   GLUCOSEU NEGATIVE 10/12/2016 1405   HGBUR LARGE (A) 10/12/2016 1405   BILIRUBINUR NEGATIVE 10/12/2016 [place] 10/12/2016 1405   PROTEINUR NEGATIVE 10/12/2016 1405   UROBILINOGEN 1.0 04/04/2010 0110   NITRITE NEGATIVE 10/12/2016 1405   LEUKOCYTESUR NEGATIVE 10/12/2016 1405     STUDIES: Dg Chest 2 View  Result Date: 10/12/2016 CLINICAL DATA:  Cough and fever. EXAM: CHEST  2 VIEW COMPARISON:  None. FINDINGS: The heart size and mediastinal contours are within normal limits. Both lungs are clear. No evidence of pleural effusion. Several old right rib fracture deformities again noted. Old lower thoracic vertebral body compression fractures with previous vertebroplasties again noted . IMPRESSION: No active cardiopulmonary  disease. Electronically Signed   By: Earle Gell M.D.   On: 10/12/2016 12:21   Dg Chest 2 View  Result Date: 10/10/2016 CLINICAL DATA:  Chronic cough after eating. EXAM: CHEST  2 VIEW COMPARISON:  11/16/2015 FINDINGS: Heart size is normal. There is aortic atherosclerosis. The lungs are clear. No evidence of aspiration pneumonia. No effusions. Old augmented lower thoracic compression fractures again noted. IMPRESSION: No active disease.  No aspiration pneumonia. Electronically Signed   By: Nelson Chimes M.D.   On: 10/10/2016 13:55   Dg Op Swallowing Func-medicare/speech Path  Result Date: 10/10/2016 Objective Swallowing Evaluation: Type of Study: MBS-Modified Barium Swallow Study Patient Details Name: Mark [name] MRN: [id number] Date of Birth: Sep 10, 1945 Today's Date: 10/10/2016 Time: SLP Start Time (ACUTE ONLY): 1125-SLP Stop Time (ACUTE ONLY): 1150 SLP Time Calculation (min) (ACUTE ONLY): 25 min Past Medical History: No past medical history on file. Past Surgical History: No past surgical history on file. 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 Additional PMH refractory immune thrombocytopenia, lupus anticoagulant disorder, DVT, sleep apnea, dysphonia. Pt comes in for outpatient MBS with symptoms including coughing during but mostly after meals, eructation, difficulty swallowing pills and pharyngeal globus sensation.  No Data Recorded Assessment / Plan / Recommendation CHL IP CLINICAL IMPRESSIONS 10/10/2016 Clinical Impression Pt exhibits a mild sensorimotor oropharyngeal dysphagia marked by mild and intermittent premature spill due to decreased oral control and cohesion. Sensory deficits impacted initiation of swallow evidenced by intermittent delay to pyriform sinuses. Laryngeal motor weakness resulted in mildly decreased laryngeal elevation and epiglottic inversion with penetration unsensed but effective throat clearing with cues for. No  laryneal intrusion while performing a chin tuck over repeated trials. Unremarkable oropharyngeal and esophageal (MBS does not diagnose esophageal deficits below UES)  transit with pill whole in applesauce. No difficulty with multi-consistency texture although he is at greater risk given mild oral deficits. Recommend regular texture (use caution with dry, crumbly food, multi-consistency), thin liquids using chin tuck strategy, pills whole in applesauce. Did not disuss with pt however if symptoms are not improved by compensatory strategies and dysphagia worsens, he may benefit from outpatient ST for motor strengthening.         SLP Visit Diagnosis Dysphagia, oropharyngeal phase (R13.12) Attention and concentration deficit following -- Frontal lobe and executive function deficit following -- Impact on safety and function (No Data)   CHL IP TREATMENT RECOMMENDATION 10/10/2016 Treatment Recommendations No treatment recommended at this time   No flowsheet data found. CHL IP DIET RECOMMENDATION 10/10/2016 SLP Diet Recommendations Regular solids;Thin liquid Liquid Administration via Cup Medication Administration Whole meds with puree Compensations Slow rate;Small sips/bites;Chin tuck;Follow solids with liquid Postural Changes Seated upright at 90 degrees;Remain semi-upright after after feeds/meals (Comment)   CHL IP OTHER RECOMMENDATIONS 10/10/2016 Recommended Consults -- Oral Care Recommendations Oral care BID Other Recommendations --   CHL IP FOLLOW UP RECOMMENDATIONS 10/10/2016 Follow up Recommendations None   No flowsheet data found.     CHL IP ORAL PHASE 10/10/2016 Oral Phase Impaired Oral - Pudding Teaspoon -- Oral - Pudding Cup -- Oral - Honey Teaspoon -- Oral - Honey Cup -- Oral - Nectar Teaspoon -- Oral - Nectar Cup -- Oral - Nectar Straw -- Oral - Thin Teaspoon -- Oral - Thin Cup Premature spillage Oral - Thin Straw -- Oral - Puree -- Oral - Mech Soft -- Oral - Regular -- Oral - Multi-Consistency -- Oral - Pill -- Oral  Phase - Comment --  CHL IP PHARYNGEAL PHASE 10/10/2016 Pharyngeal Phase Impaired Pharyngeal- Pudding Teaspoon -- Pharyngeal -- Pharyngeal- Pudding Cup -- Pharyngeal -- Pharyngeal- Honey Teaspoon -- Pharyngeal -- Pharyngeal- Honey Cup -- Pharyngeal -- Pharyngeal- Nectar Teaspoon -- Pharyngeal -- Pharyngeal- Nectar Cup -- Pharyngeal -- Pharyngeal- Nectar Straw -- Pharyngeal -- Pharyngeal- Thin Teaspoon -- Pharyngeal -- Pharyngeal- Thin Cup Delayed swallow initiation-pyriform sinuses;Reduced laryngeal elevation;Reduced airway/laryngeal closure;Penetration/Aspiration during swallow Pharyngeal Material enters airway, remains ABOVE vocal cords and not ejected out Pharyngeal- Thin Straw -- Pharyngeal -- Pharyngeal- Puree -- Pharyngeal -- Pharyngeal- Mechanical Soft -- Pharyngeal -- Pharyngeal- Regular WFL Pharyngeal -- Pharyngeal- Multi-consistency WFL Pharyngeal -- Pharyngeal- Pill WFL Pharyngeal -- Pharyngeal Comment --  CHL IP CERVICAL ESOPHAGEAL PHASE 10/10/2016 Cervical Esophageal Phase WFL Pudding Teaspoon -- Pudding Cup -- Honey Teaspoon -- Honey Cup -- Nectar Teaspoon -- Nectar Cup -- Nectar Straw -- Thin Teaspoon -- Thin Cup -- Thin Straw -- Puree -- Mechanical Soft -- Regular -- Multi-consistency -- Pill -- Cervical Esophageal Comment -- CHL IP GO 10/10/2016 Functional Assessment Tool Used skilled clinical judgement Functional Limitations Swallowing Swallow Current Status (W0981) CJ Swallow Goal Status (X9147) CJ Swallow Discharge Status (W2956) CJ Motor Speech Current Status (O1308) (None) Motor Speech Goal Status (M5784) (None) Motor Speech Goal Status (O9629) (None) Spoken Language Comprehension Current Status (B2841) (None) Spoken Language Comprehension Goal Status (L2440) (None) Spoken Language Comprehension Discharge Status (N0272) (None) Spoken Language Expression Current Status (Z3664) (None) Spoken Language Expression Goal Status (Q0347) (None) Spoken Language Expression Discharge Status (Q2595) (None)  Attention Current Status (G3875) (None) Attention Goal Status (I4332) (None) Attention Discharge Status (R5188) (None) Memory Current Status (C1660) (None) Memory Goal Status (Y3016) (None) Memory Discharge Status (W1093) (None) Voice Current Status (A3557) (None) Voice Goal Status (D2202) (None) Voice Discharge Status (R4270) (None)  Other Speech-Language Pathology Functional Limitation Current Status [phone]) (None) Other Speech-Language Pathology Functional Limitation Goal Status (K9983) (None) Other Speech-Language Pathology Functional Limitation Discharge Status [phone]) (None) [place] Siren 10/10/2016, 4:17 PM .Orbie Pyo Colvin Caroli.Ed CCC-SLP Pager [phone]            CLINICAL DATA:  Coughing with meals. Prior stroke. Globus sensation. EXAM: MODIFIED BARIUM SWALLOW TECHNIQUE: Different consistencies of barium were administered orally to the patient by the Speech Pathologist. Imaging of the pharynx was performed in the lateral projection. FLUOROSCOPY TIME:  Fluoroscopy Time:  1 minutes, 40 seconds Number of Acquired Spot Images: 0 COMPARISON:  None. FINDINGS: Thin liquid- single episode of flash penetration. This did not occur in the chin-tuck position. Early spillover with several swallows. Pure with cracker- within normal limits Barium tablet -  within normal limits IMPRESSION: 1. Single episode of flash penetration with thin liquids. Early spillover with several swallows of thin liquids. Otherwise normal exam. Please refer to the Speech Pathologists report for complete details and recommendations. Electronically Signed   By: Van Clines M.D.   On: 10/10/2016 12:12   US Venous Img Lower Unilateral Right  Result Date: 10/12/2016 CLINICAL DATA:  Right leg pain and swelling EXAM: RIGHT LOWER EXTREMITY VENOUS DOPPLER ULTRASOUND TECHNIQUE: Gray-scale sonography with graded compression, as well as color Doppler and duplex ultrasound were performed to evaluate the lower extremity deep venous systems  from the level of the common femoral vein and including the common femoral, femoral, profunda femoral, popliteal and calf veins including the posterior tibial, peroneal and gastrocnemius veins when visible. The superficial great saphenous vein was also interrogated. Spectral Doppler was utilized to evaluate flow at rest and with distal augmentation maneuvers in the common femoral, femoral and popliteal veins. COMPARISON:  None. FINDINGS: Contralateral Common Femoral Vein: Respiratory phasicity is normal and symmetric with the symptomatic side. No evidence of thrombus. Normal compressibility. Common Femoral Vein: No evidence of thrombus. Normal compressibility, respiratory phasicity and response to augmentation. Saphenofemoral Junction: No evidence of thrombus. Normal compressibility and flow on color Doppler imaging. Profunda Femoral Vein: No evidence of thrombus. Normal compressibility and flow on color Doppler imaging. Femoral Vein: Linear echogenic material is present within the femoral vein throughout the thigh. The vessel remains largely compressible and there is color flow on color Doppler imaging. Findings may represent sequelae of remote or recanalized DVT. Popliteal Vein: The popliteal vein is not compressible but rather expanded with low-level internal echoes. No significant flow on color Doppler imaging. Findings are consistent with acute thrombus. Calf Veins: No evidence of thrombus. Normal compressibility and flow on color Doppler imaging. Superficial Great Saphenous Vein: No evidence of thrombus. Normal compressibility and flow on color Doppler imaging. Venous Reflux:  None. Other Findings: Large complex fluid collection originating in the popliteal space and extending posteriorly along the entirety of the calf to the level of the ankle. IMPRESSION: 1. Positive for acute occlusive deep venous thrombosis in the popliteal vein. 2. Linear nonocclusive echogenic foci throughout the femoral vein in the  thigh may represent the sequelae of a prior and now recanalized DVT. 3. Large complex fluid collection in the subcutaneous soft tissues beginning in the popliteal fossa and tracking inferiorly along the posterior calf to the level of the Achilles tendon. Differential considerations include hematoma, perhaps due to partial gastrocnemius tear, partial or complete tear of the Achilles tendon with superior retraction of the muscle, and potentially a complex ruptured Baker's cyst. Electronically Signed   By: Jacqulynn Cadet  M.D.   On: 10/12/2016 13:24     ASSESSMENT: 71 y.o. [place] man with a history of refractory ITP, positive LAC and history of Left body stroke, prior DVTs in RUE and LLE, now with a new Right popliteal DVT in the setting of a supratherapeutic INR (4.71)   (a) received 2.5 mg vit K orally yesterday, INR today 3.54-- coumadin stopped 10/12/2016  (b) on IV heparin at 500 u/hr-- level <0.10 this AM  (c) patient has a popliteal fossa fluid collection which may be hematoma or ruptured Baker's cyst--MRI pending  (d) platelets >100K  PLAN: I discussed overall situation with the patient and his daughter. They understand coumadin has "failed" in his case and he will need to be on a different form of anticoagulation--perhaps daily lovenox, which would be my choice. In the meantime he is at risk of PE and an IVC filter is prudent. How long the filter will need to be there is an issue they will discuss with Dr Marin Olp-- my own recommendation would be to place a removable filter and leave it 2-4 months until we are sure his new anticoagulant is working and the patient is stable, at which time thought can be given to removal of the filter. They have many appropriate questions which can be addressed by IR when then consent him  Currently I would repeat vit K and increase the IV heparin to 700 units/hr.We can recheck an INR about 4 PM and depending on the INR then and the findings from the RLE  MRI consider moving to therapeutic heparin levels-- orders written.  Please let me know if I can be of further help at this point. Dr Marin Olp will be by in AM and take it from there. Chauncey Cruel, MD   10/13/2016 10:29 AM Medical Oncology and Hematology [facility] [address] [place],  [address] Tel. [phone]    Fax. [phone]

## 2016-10-13 NOTE — Progress Notes (Signed)
Cased reviewed with Dr. Anselm Pancoast.  He agrees with Heparin currently.  The vascular US does not give enough information to truly determine what is going on in his calf.  As of this time, there is no true indication for a filter to be place.  If this is a complex Baker's cyst, then there is no reason to not continue anticoagulation and avoid a filter.  If this is a hematoma, then it would need to be reviewed by IR MD here tomorrow to determine whether this is still an indication for a filter.  Mark [name] E 1:25 PM 10/13/2016

## 2016-10-13 NOTE — Progress Notes (Addendum)
ANTICOAGULATION CONSULT NOTE - Follow-up  Pharmacy Consult for heparin Indication: VTE treatment  No Known Allergies  Patient Measurements: Height: 6' (182.9 cm) Weight: 200 lb (90.7 kg) IBW/kg (Calculated) : 77.6 Heparin Dosing Weight: 90  Vital Signs: Temp: 98.3 F (36.8 C) (07/01 1604) Temp Source: Oral (07/01 1350) BP: 139/64 (07/01 1350) Pulse Rate: 78 (07/01 1350)  Labs:  Recent Labs  10/12/16 1125 10/12/16 2053 10/13/16 0449 10/13/16 0530 10/13/16 1701  HGB 12.3* 11.4* 10.7*  --   --   HCT 35.9* 33.8* 32.3*  --   --   PLT 130* 120* 104*  --   --   LABPROT 45.6*  --  36.3*  --  23.9*  INR 4.71*  --  3.54  --  2.10  HEPARINUNFRC  --   --   --  <0.10*  --   CREATININE 1.71*  --  1.32*  --   --     Estimated Creatinine Clearance: 56.3 mL/min (A) (by C-G formula based on SCr of 1.32 mg/dL (H)).   Medical History: Past Medical History:  Diagnosis Date  . Chronic ITP (idiopathic thrombocytopenia) (HCC)   . Coronary artery disease 1992   MI  . DVT (deep venous thrombosis) ([place])   . 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 Hypertension   . Lupus anticoagulant disorder ([place])   . Multiple closed anterior-posterior compression fractures of pelvis ([place])   . Seizures ([place])   . Stroke [facility]) 02/27/2011   Left side weakness    Assessment: 71yo male with hx DVT and strokes, presenting with pain in L-popliteal area & radiation to calf.  Pt is on Coumadin at home with INR 4.71 today, up from 2.8 on 5/29.  Discussed starting heparin with DrGoldston due to INR, who stated he had spoken with Hematology and was advised to start heparin; plan is for an IVC filter.  Pt has not been on antibiotics or new medications per DrGoldston's d/w patient.  Today:  - INR now 2.1. Per Dr. Jana Hakim, do not adjust heparin until INR is <2. - IR to consult tomorrow for IVC filter. - MRI of leg to identify fluid collection is pending. UPDATE - Dr. Jana Hakim increased heparin to 1100 units/hr and wants pharmacy  to dose to a conservative heparin level 0.3-0.45.  Goal of Therapy:  Heparin level 0.3-0.45 units/ml Monitor platelets by anticoagulation protocol: Yes   Plan:  Cont heparin at 1100units/hr Check heparin level at 2400 Daily heparin level, CBC  Romeo Rabon, PharmD, pager [phone]. 10/13/2016,5:49 PM.

## 2016-10-13 NOTE — Progress Notes (Addendum)
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[place])   Ischemic stroke ([place])   Hemiparesis, left (HCC)   LA (lupus anticoagulant) disorder (HCC)   Supratherapeutic INR   Acute deep vein thrombosis (DVT) of popliteal vein of right lower extremity (HCC)   AKI (acute kidney injury) ([place])   Fever   Leukocytosis   Bacteriuria   Acute DVT (right lower extremity) with history of positive lupus anticoagulant, but elevated INR with questionable calf hematoma --HOLD warfarin, s/p vitamin k x2 per hematology recommendation --hematology recommended possible IVC filter, IR consulted --he is stared on heparin drip, continue, will follow IR and hematology recommendations  Abnormal fluid collection on ultrasound --Dr Eulas Post discussed with on call ortho surgeon Dr Veverly Fells who recommend MRI, call back for formal consulted if needed after mri. --s/p vitk for possible hematoma,  --He takes a low dose of vitamin K daily at baseline but still comes in with warfarin toxicity  AKI/bacteriuria/urinary retention required in and out cath in the ED --with fever, leukocytosis on presentation -urine culture and blood culture pending -empirically start rocephin for now --Repeat BMP in the AM, follow up on culture result  History of ITP --he report required burst dose steroids prn for itp flare -plt 104, continue to monitor Hematology consulted  Fever 101.2 on admission --Could be related to the DVT.  ? uti with urinary retention and leukocytosis --he is currently stable;  does not look septic. -empirically start on rocephin, follow culture and fever curve  HTN --Amlodipine  History of CVA --HOLD aspirin for now since he is on IV  heparin and has supratherapeutic INR --Statin  H/o seizure: continue keppra  BPH/h/o urinary retention --Flomax, bethanechol  Mood disorder --Wellbutrin, Zoloft Stable, pleasant  GERD --Pepcid     DVT prophylaxis: Anticoagulated with IV heparin Code Status: FULL Family Communication:  wife at bedside. Disposition Plan: Expect he will go home when ready for discharge. May need home health,   Consultants:  Hematology oncology  Interventional radiology  Orthopedics (Dr Eulas Post talked to Dr Veverly Fells over the phone)  Procedures:  none  Antibiotics:  Rocephin from 7/1   Objective: BP 125/69 (BP Location: Right Arm)   Pulse 71   Temp 98.6 F (37 C) (Oral)   Resp 20   Ht 6' (1.829 m)   Wt 90.7 kg (200 lb)   SpO2 95%   BMI 27.12 kg/m   Intake/Output Summary (Last 24 hours) at 10/13/16 1250 Last data filed at 10/13/16 1120  Gross per 24 hour  Intake          1779.84 ml  Output              550 ml  Net          1229.84 ml   Filed Weights   10/12/16 1012  Weight: 90.7 kg (200 lb)    Exam:   General:  NAD  Cardiovascular: RRR  Respiratory: CTABL  Abdomen: Soft/ND/NT, positive BS  Musculoskeletal: No Edema  Neuro: aaox3, residual left sided weakness from remote cva  Skin: petechiae?   Data Reviewed: Basic Metabolic Panel:  Recent Labs Lab 10/12/16 1125 10/13/16 0449  NA 138 140  K 4.4 4.2  CL 104 109  CO2 24 24  GLUCOSE  115* 95  BUN 39* 29*  CREATININE 1.71* 1.32*  CALCIUM 9.0 8.6*   Liver Function Tests:  Recent Labs Lab 10/12/16 1125  AST 20  ALT 34  ALKPHOS 56  BILITOT 0.7  PROT 6.8  ALBUMIN 3.9   No results for input(s): LIPASE, AMYLASE in the last 168 hours. No results for input(s): AMMONIA in the last 168 hours. CBC:  Recent Labs Lab 10/12/16 1125 10/12/16 2053 10/13/16 0449  WBC 12.4* 13.0* 12.2*  NEUTROABS 7.3  --   --   HGB 12.3* 11.4* 10.7*  HCT 35.9* 33.8* 32.3*  MCV 89.3 87.3 86.8  PLT 130*  120* 104*   Cardiac Enzymes:   No results for input(s): CKTOTAL, CKMB, CKMBINDEX, TROPONINI in the last 168 hours. BNP (last 3 results) No results for input(s): BNP in the last 8760 hours.  ProBNP (last 3 results) No results for input(s): PROBNP in the last 8760 hours.  CBG: No results for input(s): GLUCAP in the last 168 hours.  Recent Results (from the past 240 hour(s))  Blood Culture (routine x 2)     Status: None (Preliminary result)   Collection Time: 10/12/16 11:25 AM  Result Value Ref Range Status   Specimen Description   Final    BLOOD RIGHT ANTECUBITAL Performed at [facility], [place] [address], [place], [place] [address]    Special Requests   Final    BOTTLES DRAWN AEROBIC AND ANAEROBIC Blood Culture adequate volume   Culture PENDING  Incomplete   Report Status PENDING  Incomplete  Blood Culture (routine x 2)     Status: None (Preliminary result)   Collection Time: 10/12/16 11:45 AM  Result Value Ref Range Status   Specimen Description   Final    BLOOD LEFT ANTECUBITAL Performed at [facility], [place] [address] [place], [place] [address]    Special Requests IN PEDIATRIC BOTTLE Blood Culture adequate volume  Final   Culture PENDING  Incomplete   Report Status PENDING  Incomplete     Studies: US Venous Img Lower Unilateral Right  Result Date: 10/12/2016 CLINICAL DATA:  Right leg pain and swelling EXAM: RIGHT LOWER EXTREMITY VENOUS DOPPLER ULTRASOUND TECHNIQUE: Gray-scale sonography with graded compression, as well as color Doppler and duplex ultrasound were performed to evaluate the lower extremity deep venous systems from the level of the common femoral vein and including the common femoral, femoral, profunda femoral, popliteal and calf veins including the posterior tibial, peroneal and gastrocnemius veins when visible. The superficial great saphenous vein was also interrogated. Spectral Doppler was utilized to evaluate flow at rest and with distal  augmentation maneuvers in the common femoral, femoral and popliteal veins. COMPARISON:  None. FINDINGS: Contralateral Common Femoral Vein: Respiratory phasicity is normal and symmetric with the symptomatic side. No evidence of thrombus. Normal compressibility. Common Femoral Vein: No evidence of thrombus. Normal compressibility, respiratory phasicity and response to augmentation. Saphenofemoral Junction: No evidence of thrombus. Normal compressibility and flow on color Doppler imaging. Profunda Femoral Vein: No evidence of thrombus. Normal compressibility and flow on color Doppler imaging. Femoral Vein: Linear echogenic material is present within the femoral vein throughout the thigh. The vessel remains largely compressible and there is color flow on color Doppler imaging. Findings may represent sequelae of remote or recanalized DVT. Popliteal Vein: The popliteal vein is not compressible but rather expanded with low-level internal echoes. No significant flow on color Doppler imaging. Findings are consistent with acute thrombus. Calf Veins: No evidence of thrombus.  Normal compressibility and flow on color Doppler imaging. Superficial Great Saphenous Vein: No evidence of thrombus. Normal compressibility and flow on color Doppler imaging. Venous Reflux:  None. Other Findings: Large complex fluid collection originating in the popliteal space and extending posteriorly along the entirety of the calf to the level of the ankle. IMPRESSION: 1. Positive for acute occlusive deep venous thrombosis in the popliteal vein. 2. Linear nonocclusive echogenic foci throughout the femoral vein in the thigh may represent the sequelae of a prior and now recanalized DVT. 3. Large complex fluid collection in the subcutaneous soft tissues beginning in the popliteal fossa and tracking inferiorly along the posterior calf to the level of the Achilles tendon. Differential considerations include hematoma, perhaps due to partial gastrocnemius  tear, partial or complete tear of the Achilles tendon with superior retraction of the muscle, and potentially a complex ruptured Baker's cyst. Electronically Signed   By: Jacqulynn Cadet M.D.   On: 10/12/2016 13:24    Scheduled Meds: . amLODipine  2.5 mg Oral QHS  . bethanechol  12.5 mg Oral BID  . buPROPion  150 mg Oral Daily  . famotidine  40 mg Oral BID  . levETIRAcetam  1,500 mg Oral BID  . rosuvastatin  20 mg Oral QHS  . sertraline  200 mg Oral Daily  . tamsulosin  0.8 mg Oral QHS    Continuous Infusions: . sodium chloride 100 mL/hr at 10/13/16 1024  . cefTRIAXone (ROCEPHIN)  IV    . heparin 700 Units/hr (10/13/16 1041)     Time spent: 27mins  Shawnique Mariotti MD, PhD  [facility] Pager [phone]. If 7PM-7AM, please contact night-coverage at [email or web address], password [facility] 10/13/2016, 12:50 PM  LOS: 1 day

## 2016-10-13 NOTE — Progress Notes (Signed)
ADDENDUM: 1815 PM  INR down to 2.10, will be dropping towards normal overnight given repeat dose vit K earlier today. Accordingly at this point I am raising the heparin drip to 1100/hr and placing a pharmacy consult for further adjustments. Given the concern re bleeding (MRI not yet done) would aim for a low-therapeutic level  (0.30-0.45)  Will sign off at this time. Please let me know if I can be of further help.

## 2016-10-14 ENCOUNTER — Inpatient Hospital Stay (HOSPITAL_COMMUNITY): Payer: Medicare HMO

## 2016-10-14 ENCOUNTER — Other Ambulatory Visit: Payer: Medicare Other

## 2016-10-14 ENCOUNTER — Ambulatory Visit: Payer: Medicare Other | Admitting: Family

## 2016-10-14 DIAGNOSIS — Z7901 Long term (current) use of anticoagulants: Secondary | ICD-10-CM

## 2016-10-14 DIAGNOSIS — D696 Thrombocytopenia, unspecified: Secondary | ICD-10-CM

## 2016-10-14 LAB — PROTIME-INR
INR: 1.58
Prothrombin Time: 19.1 seconds — ABNORMAL HIGH (ref 11.4–15.2)

## 2016-10-14 LAB — BASIC METABOLIC PANEL
Anion gap: 7 (ref 5–15)
BUN: 24 mg/dL — AB (ref 6–20)
CO2: 20 mmol/L — ABNORMAL LOW (ref 22–32)
CREATININE: 1.28 mg/dL — AB (ref 0.61–1.24)
Calcium: 8.1 mg/dL — ABNORMAL LOW (ref 8.9–10.3)
Chloride: 112 mmol/L — ABNORMAL HIGH (ref 101–111)
GFR calc Af Amer: >60 mL/min (ref >60)
GFR, EST NON AFRICAN AMERICAN: 55 mL/min — AB (ref >60)
Glucose, Bld: 94 mg/dL (ref 65–99)
Potassium: 3.9 mmol/L (ref 3.5–5.1)
Sodium: 139 mmol/L (ref 135–145)

## 2016-10-14 LAB — HEPARIN LEVEL (UNFRACTIONATED)
HEPARIN UNFRACTIONATED: 0.15 [IU]/mL — AB (ref 0.30–0.70)
Heparin Unfractionated: 0.2 IU/mL — ABNORMAL LOW (ref 0.30–0.70)
Heparin Unfractionated: 0.89 IU/mL — ABNORMAL HIGH (ref 0.30–0.70)

## 2016-10-14 LAB — CBC
HCT: 29.2 % — ABNORMAL LOW (ref 39.0–52.0)
Hemoglobin: 9.8 g/dL — ABNORMAL LOW (ref 13.0–17.0)
MCH: 29.4 pg (ref 26.0–34.0)
MCHC: 33.6 g/dL (ref 30.0–36.0)
MCV: 87.7 fL (ref 78.0–100.0)
PLATELETS: 79 10*3/uL — AB (ref 150–400)
RBC: 3.33 MIL/uL — ABNORMAL LOW (ref 4.22–5.81)
RDW: 14.8 % (ref 11.5–15.5)
WBC: 12.1 10*3/uL — AB (ref 4.0–10.5)

## 2016-10-14 MED ORDER — DEXAMETHASONE SODIUM PHOSPHATE 4 MG/ML IJ SOLN: 20.0000 mg | INTRAMUSCULAR | Status: DC

## 2016-10-14 MED ORDER — GADOBENATE DIMEGLUMINE 529 MG/ML IV SOLN
20.0000 mL | Freq: Once | INTRAVENOUS | Status: AC | PRN
  Administered 2016-10-14: 20 mL via INTRAVENOUS

## 2016-10-14 MED ORDER — HEPARIN (PORCINE) IN NACL 100-0.45 UNIT/ML-% IJ SOLN
1300.0000 [IU]/h | INTRAMUSCULAR | Status: DC
  Filled 2016-10-14: qty 250

## 2016-10-14 MED ORDER — HEPARIN (PORCINE) IN NACL 100-0.45 UNIT/ML-% IJ SOLN
1400.0000 [IU]/h | INTRAMUSCULAR | Status: DC
  Administered 2016-10-14: 1400 [IU]/h via INTRAVENOUS
  Filled 2016-10-14: qty 250

## 2016-10-14 MED ORDER — HEPARIN (PORCINE) IN NACL 100-0.45 UNIT/ML-% IJ SOLN
1500.0000 [IU]/h | INTRAMUSCULAR | Status: DC
  Filled 2016-10-14 (×2): qty 250

## 2016-10-14 MED ORDER — DEXAMETHASONE SODIUM PHOSPHATE 100 MG/10ML IJ SOLN
20.0000 mg | INTRAMUSCULAR | Status: DC
  Administered 2016-10-14 – 2016-10-16 (×3): 20 mg via INTRAVENOUS
  Filled 2016-10-14 (×4): qty 2

## 2016-10-14 NOTE — Progress Notes (Signed)
I am grateful for everybody's help. I have known Mark [name] for probably 15 years. He has done very well. He has both a hypercoagulable state and history of bleeding from ITP.  He now has a new thrombus in the right popliteal vein. It also looks like there is a bleed or some type of fluid collection in the right lower leg. He is going for his MRI this morning.  He is on a heparin drip right now.  When he came in, his Coumadin was a little bit above therapeutic INR.  He said that he been doing some exercises to help strengthen his right leg. He has had the stroke that has caused left-sided weakness.  I do not believe that he needs an IVC filter. There is no obvious contraindication to anticoagulation. From the report of the Doppler, there is some concern about the possibility of a muscle tear. He says it is still somewhat painful when he tries to ambulate. He goes for the MRI this morning.  I think once we know that he does not need any invasive procedure, then I would get him on one of the new oral anticoagulants. I would be a very reasonable option for him.  Of course, his leg count is trending downward. He was response to steroids. I'll get him on some IV Decadron. I suppose we need to also consider heparin-induced thrombocytopenia. When he came in, his platelet count was 130,000. I would have to think that his thrombocytopenia is more his ITP. As such, I would keep the heparin going for right now.  He's had no cough. No shortness of breath. He does have sleep apnea.  His vital signs all look relatively stable. His temperature is 98.9. His pulse 60. Blood pressure 136/63. His lungs are clear bilaterally. Cardiac exam regular rate and rhythm with no murmurs, rubs or bruits. Abdomen is soft. He is mildly obese. There is no palpable liver or spleen tip. Extremities shows the swelling in the right lower leg. He does have an area of ecchymoses in the gastrocnemius area. His left leg is unremarkable.  Neurological exam is nonfocal.  Mark [name] is a complicated situation. He has been on long term anticoagulation. He also has ITP that flares up. He now has a new thrombus in the right leg. He is on heparin. His platelet count is down a little.  He goes for his MRI this morning.  I would keep him on the heparin infusion.  We will follow along closely.  Lattie Haw, MD  Mark [name] 7:7

## 2016-10-14 NOTE — Progress Notes (Signed)
PROGRESS NOTE  Mark [name] RCV:[id number] DOB: 1945-07-11 DOA: 10/12/2016 PCP: Harlan Stains, MD  HPI/Recap of past 24 hours:  afebril last 24hrs, report no leg pain at rest, but still has significant pain when he try to walk   Assessment/Plan: Principal Problem:   DVT (deep venous thrombosis) ([place]) Active Problems:   Idiopathic thrombocytopenic purpura (HCC)   Ischemic stroke (HCC)   Hemiparesis, left (HCC)   LA (lupus anticoagulant) disorder ([place])   Supratherapeutic INR   Acute deep vein thrombosis (DVT) of popliteal vein of right lower extremity (HCC)   AKI (acute kidney injury) ([place])   Fever   Leukocytosis   Bacteriuria   Acute DVT (right lower extremity) with history of positive lupus anticoagulant, but elevated INR with questionable calf hematoma --HOLD warfarin, s/p vitamin k x2 per hematology recommendation --oncall hematology initially recommended possible IVC filter, IR consulted, Dr Marin Olp who is patient primary hematology recommend against it --he is continued on heparin drip, will follow hematology recommendations  Abnormal fluid collection on ultrasound ----s/p vitk for possible hematoma,  --He takes a low dose of vitamin K daily at baseline but still comes in with warfarin toxicity --Dr Eulas Post discussed with on call ortho surgeon Dr Veverly Fells who recommend MRI, call back for formal consulted if needed after mri. -mri right leg result noted, I have discussed with [facility] ortho Dr Norris's PA who reviewed mri imaging recommend conservative management, no surgery recommended.  AKI/bacteriuria/urinary retention required in and out cath in the ED --with fever, leukocytosis on presentation,  S/p rocephin x1 dose on 7/1 -urine culture and blood culture no growth --cr 1.7 on admission, cr 1.28 today which close to baseline  History of ITP/ worsening thrombocytopenia --he report required burst dose steroids prn for itp flare -plt 104-71, consumption for  acute dvt/itp flares/hit? Are on differential -Hematology consulted, started on decadron for possible ITP flare up, will check hit panel as well  Fever 101.2 on admission --Could be related to the DVT.  ? uti with urinary retention and leukocytosis --he is currently stable;  does not look septic. -s/p  rocephinx1, culture on growth, fever subsided, monitor off abx.  HTN --Amlodipine  History of CVA, with left hemiparesis --HOLD aspirin for now since he is on IV heparin and has supratherapeutic INR --Statin  H/o seizure: continue keppra  BPH/h/o urinary retention --Flomax, bethanechol  Mood disorder --Wellbutrin, Zoloft Stable, pleasant  GERD --Pepcid   FTT: patient has baseline hemiparesis, now right leg can barely bear weight due to pain, will have PT eval, he can not use walker due to no use of left arm due to prior stroke, may need rehab placement if right leg still not able to bear weight.  DVT prophylaxis: Anticoagulated with IV heparin Code Status: FULL Family Communication:  wife updated  Disposition Plan: home with home health Vs rehab , need hematology clearance  Consultants:  Hematology oncology  Interventional radiology  Orthopedics (Dr Eulas Post talked to Dr Veverly Fells over the phone, I talked to Dr Veverly Fells' pa on 7/2)  Procedures:  none  Antibiotics:  Rocephin from 7/1   Objective: BP 136/63 (BP Location: Right Arm)   Pulse 68   Temp 98.9 F (37.2 C) (Oral)   Resp 18   Ht 6' (1.829 m)   Wt 90.7 kg (200 lb)   SpO2 95%   BMI 27.12 kg/m   Intake/Output Summary (Last 24 hours) at 10/14/16 1906 Last data filed at 10/14/16 1549  Gross per 24 hour  Intake          3193.32 ml  Output                0 ml  Net          3193.32 ml   Filed Weights   10/12/16 1012  Weight: 90.7 kg (200 lb)    Exam:   General:  NAD  Cardiovascular: RRR  Respiratory: CTABL  Abdomen: Soft/ND/NT, positive BS  Musculoskeletal: No Edema  Neuro: aaox3,  residual left sided weakness from remote cva  Skin: petechiae?   Data Reviewed: Basic Metabolic Panel:  Recent Labs Lab 10/12/16 1125 10/13/16 0449 10/14/16 0423  NA 138 140 139  K 4.4 4.2 3.9  CL 104 109 112*  CO2 24 24 20*  GLUCOSE 115* 95 94  BUN 39* 29* 24*  CREATININE 1.71* 1.32* 1.28*  CALCIUM 9.0 8.6* 8.1*   Liver Function Tests:  Recent Labs Lab 10/12/16 1125  AST 20  ALT 34  ALKPHOS 56  BILITOT 0.7  PROT 6.8  ALBUMIN 3.9   No results for input(s): LIPASE, AMYLASE in the last 168 hours. No results for input(s): AMMONIA in the last 168 hours. CBC:  Recent Labs Lab 10/12/16 1125 10/12/16 2053 10/13/16 0449 10/14/16 0423  WBC 12.4* 13.0* 12.2* 12.1*  NEUTROABS 7.3  --   --   --   HGB 12.3* 11.4* 10.7* 9.8*  HCT 35.9* 33.8* 32.3* 29.2*  MCV 89.3 87.3 86.8 87.7  PLT 130* 120* 104* 79*   Cardiac Enzymes:   No results for input(s): CKTOTAL, CKMB, CKMBINDEX, TROPONINI in the last 168 hours. BNP (last 3 results) No results for input(s): BNP in the last 8760 hours.  ProBNP (last 3 results) No results for input(s): PROBNP in the last 8760 hours.  CBG: No results for input(s): GLUCAP in the last 168 hours.  Recent Results (from the past 240 hour(s))  Blood Culture (routine x 2)     Status: None (Preliminary result)   Collection Time: 10/12/16 11:25 AM  Result Value Ref Range Status   Specimen Description BLOOD RIGHT ANTECUBITAL  Final   Special Requests   Final    BOTTLES DRAWN AEROBIC AND ANAEROBIC Blood Culture adequate volume   Culture   Final    NO GROWTH 2 DAYS Performed at [facility], [address], [place], [place] [address]    Report Status PENDING  Incomplete  Blood Culture (routine x 2)     Status: None (Preliminary result)   Collection Time: 10/12/16 11:45 AM  Result Value Ref Range Status   Specimen Description BLOOD LEFT ANTECUBITAL  Final   Special Requests IN PEDIATRIC BOTTLE Blood Culture adequate volume  Final    Culture   Final    NO GROWTH 2 DAYS Performed at [facility], [place] [address], [place], [place] [address]    Report Status PENDING  Incomplete  Urine culture     Status: None   Collection Time: 10/12/16  2:05 PM  Result Value Ref Range Status   Specimen Description URINE, CATHETERIZED  Final   Special Requests NONE  Final   Culture   Final    NO GROWTH Performed at [facility], [address] [place], [place] [address]    Report Status 10/13/2016 FINAL  Final     Studies: Mr Tibia Fibula Right W Wo Contrast  Result Date: 10/14/2016 CLINICAL DATA:  Pain, swelling and ecchymosis in the right calf. Large  complex fluid collection noted on recent ultrasound. EXAM: MRI OF LOWER RIGHT EXTREMITY WITHOUT AND WITH CONTRAST TECHNIQUE: Multiplanar, multisequence MR imaging of the right lower extremity was performed both before and after administration of intravenous contrast. CONTRAST:  63mL MULTIHANCE GADOBENATE DIMEGLUMINE 529 MG/ML IV SOLN COMPARISON:  Ultrasound 10/12/2016 FINDINGS: As demonstrated on the ultrasound examination is ache large complex fluid collection extending from the popliteal space down along the posterior and medial aspect of the calf all the way down to the ankle. This has mixed T1 and T2 signal intensity with areas of increased T1 signal intensity consistent with hemorrhage. No worrisome rim enhancement to suggest abscess. Findings are most consistent with a complex/ hemorrhagic leaking/ruptured Baker's cyst, particularly with history of anti coagulation. Fairly extensive areas of fatty change involving the calf musculature bilaterally most likely due to denervation injuries or prior diabetic muscle infarcts. Moderate right and small left knee joint effusions. No MR findings to suggest septic arthritis or osteomyelitis. IMPRESSION: Large, complex/hemorrhagic, leaking/ruptured Baker's cyst. No mass or evidence of abscess. Diffuse fatty change involving calf muscles  bilaterally likely denervation process or remote diabetic muscle infarct. Bilateral knee joint effusions, right greater than left. No MR findings to suggest septic arthritis or osteomyelitis. Electronically Signed   By: Marijo Sanes M.D.   On: 10/14/2016 09:31    Scheduled Meds: . amLODipine  2.5 mg Oral QHS  . bethanechol  12.5 mg Oral BID  . buPROPion  150 mg Oral Daily  . famotidine  40 mg Oral BID  . levETIRAcetam  1,500 mg Oral BID  . rosuvastatin  20 mg Oral QHS  . sertraline  200 mg Oral Daily  . tamsulosin  0.8 mg Oral QHS    Continuous Infusions: . sodium chloride 100 mL/hr at 10/13/16 2345  . dexamethasone (DECADRON) IVPB [facility] Stopped (10/14/16 1549)  . heparin 1,500 Units/hr (10/14/16 1200)     Time spent: 82mins  Marixa Mellott MD, PhD  [facility] Pager [phone]. If 7PM-7AM, please contact night-coverage at [email or web address], password [facility] 10/14/2016, 7:06 PM  LOS: 2 days

## 2016-10-14 NOTE — Progress Notes (Signed)
ANTICOAGULATION CONSULT NOTE - Follow-up  Pharmacy Consult for heparin Indication: VTE treatment  No Known Allergies  Patient Measurements: Height: 6' (182.9 cm) Weight: 200 lb (90.7 kg) IBW/kg (Calculated) : 77.6 Heparin Dosing Weight: 90  Vital Signs: Temp: 98.8 F (37.1 C) (07/01 2133) Temp Source: Oral (07/01 2133) BP: 153/72 (07/01 2133) Pulse Rate: 81 (07/01 2133)  Labs:  Recent Labs  10/12/16 1125 10/12/16 2053 10/13/16 0449 10/13/16 0530 10/13/16 1701 10/14/16 0019  HGB 12.3* 11.4* 10.7*  --   --   --   HCT 35.9* 33.8* 32.3*  --   --   --   PLT 130* 120* 104*  --   --   --   LABPROT 45.6*  --  36.3*  --  23.9*  --   INR 4.71*  --  3.54  --  2.10  --   HEPARINUNFRC  --   --   --  <0.10*  --  0.15*  CREATININE 1.71*  --  1.32*  --   --   --     Estimated Creatinine Clearance: 56.3 mL/min (A) (by C-G formula based on SCr of 1.32 mg/dL (H)).   Medical History: Past Medical History:  Diagnosis Date  . Chronic ITP (idiopathic thrombocytopenia) (HCC)   . Coronary artery disease 1992   MI  . DVT (deep venous thrombosis) ([place])   . 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 Hypertension   . Lupus anticoagulant disorder ([place])   . Multiple closed anterior-posterior compression fractures of pelvis ([place])   . Seizures ([place])   . Stroke [facility]) 02/27/2011   Left side weakness    Assessment: 71yo male with hx DVT and strokes, presenting with pain in L-popliteal area & radiation to calf.  Pt is on Coumadin at home with INR 4.71 today, up from 2.8 on 5/29.  Discussed starting heparin with DrGoldston due to INR, who stated he had spoken with Hematology and was advised to start heparin; plan is for an IVC filter.  Pt has not been on antibiotics or new medications per DrGoldston's d/w patient.  7/1  - INR now 2.1. Per Dr. Jana Hakim, do not adjust heparin until INR is <2. - IR to consult tomorrow for IVC filter. - MRI of leg to identify fluid collection is pending. UPDATE - Dr. Jana Hakim  increased heparin to 1100 units/hr and wants pharmacy to dose to a conservative heparin level 0.3-0.45. Today, 7/2 -0019 HL=0.15 below goal, no infusion or bleeding issues per RN  Goal of Therapy:  Heparin level 0.3-0.45 units/ml Monitor platelets by anticoagulation protocol: Yes   Plan:  Increase heparin drip to 1300 units/hr Recheck HL in 8 hours Daily heparin level, CBC  Jonluke Cobbins R . 10/14/2016,2:29 AM.

## 2016-10-14 NOTE — Progress Notes (Addendum)
ANTICOAGULATION CONSULT NOTE - Follow-up  Pharmacy Consult for heparin Indication: VTE treatment  No Known Allergies  Patient Measurements: Height: 6' (182.9 cm) Weight: 200 lb (90.7 kg) IBW/kg (Calculated) : 77.6 Heparin Dosing Weight: 90  Vital Signs: Temp: 98.9 F (37.2 C) (07/02 0439) Temp Source: Oral (07/02 0439) BP: 136/63 (07/02 0439) Pulse Rate: 68 (07/02 0439)  Labs:  Recent Labs  10/12/16 1125 10/12/16 2053 10/13/16 0449 10/13/16 0530 10/13/16 1701 10/14/16 0019 10/14/16 0423 10/14/16 1022  HGB 12.3* 11.4* 10.7*  --   --   --  9.8*  --   HCT 35.9* 33.8* 32.3*  --   --   --  29.2*  --   PLT 130* 120* 104*  --   --   --  79*  --   LABPROT 45.6*  --  36.3*  --  23.9*  --  19.1*  --   INR 4.71*  --  3.54  --  2.10  --  1.58  --   HEPARINUNFRC  --   --   --  <0.10*  --  0.15*  --  0.20*  CREATININE 1.71*  --  1.32*  --   --   --  1.28*  --     Estimated Creatinine Clearance: 58.1 mL/min (A) (by C-G formula based on SCr of 1.28 mg/dL (H)).   Medical History: Past Medical History:  Diagnosis Date  . Chronic ITP (idiopathic thrombocytopenia) ([facility])   . Coronary artery disease 1992   MI  . DVT (deep venous thrombosis) ([place])   . 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 Hypertension   . Lupus anticoagulant disorder ([place])   . Multiple closed anterior-posterior compression fractures of pelvis ([place])   . Seizures ([place])   . Stroke [facility]) 02/27/2011   Left side weakness    Assessment: 71yo male with hx DVT and strokes on warfarin PTA admitted with acute DVT.  PMH also significant for positive lupus anticoagulant and ITP.  INR 4.71 on admission.  Hematology advised to start heparin infusion. Hematology considering this a warfarin failure.  Significant events: 6/30 US venous lower right extremity positive for acute occlusive deep venous thrombosis in the popliteal vein.  Fluid collection originating in the popliteal space and extending posteriorly along the entirety of the calf to the  level of the ankle also seen concerning for hematoma in setting of elevated INR.  Warfarin stopped and Vitamin K ordered for reversal. 7/2 MRI right leg: Large, complex/hemorrhagic, leaking/ruptured Baker's cyst. No mass or evidence of abscess.  Today, 10/14/2016: - Heparin level 0.20 with infusion at 1300 units/hr. Trying to obtain a narrow, conservative heparin goal of 0.30-0.45 units/mL per Dr. Virgie Dad recommendation. - Hgb and platelets low and trending down (9.8 and 79K this morning, respectively). Hematology ordered IV Decadron today in response to this downward trend and recommended continuing IV heparin for now per today's note.  RN reports no signs of bleeding. - INR now 1.58 following Vitamin K 2.5 mg x 2 days.  - SCr improving, CrCl~58 ml/min  Goal of Therapy:  Heparin level 0.3-0.45 units/ml per Dr. Jana Hakim Monitor platelets by anticoagulation protocol: Yes   Plan:  Increase heparin drip to 1500 units/hr Recheck HL in 8 hours Daily heparin level, CBC F/u further recommendations for long-term anticoagulation per hematology  Hershal Coria . 10/14/2016,11:18 AM.

## 2016-10-14 NOTE — Progress Notes (Signed)
ANTICOAGULATION CONSULT NOTE - Follow-up  Pharmacy Consult for heparin Indication: VTE treatment  No Known Allergies  Patient Measurements: Height: 6' (182.9 cm) Weight: 200 lb (90.7 kg) IBW/kg (Calculated) : 77.6 Heparin Dosing Weight: 90  Vital Signs:    Labs:  Recent Labs  10/12/16 1125 10/12/16 2053 10/13/16 0449  10/13/16 1701 10/14/16 0019 10/14/16 0423 10/14/16 1022 10/14/16 1948  HGB 12.3* 11.4* 10.7*  --   --   --  9.8*  --   --   HCT 35.9* 33.8* 32.3*  --   --   --  29.2*  --   --   PLT 130* 120* 104*  --   --   --  79*  --   --   LABPROT 45.6*  --  36.3*  --  23.9*  --  19.1*  --   --   INR 4.71*  --  3.54  --  2.10  --  1.58  --   --   HEPARINUNFRC  --   --   --   < >  --  0.15*  --  0.20* 0.89*  CREATININE 1.71*  --  1.32*  --   --   --  1.28*  --   --   < > = values in this interval not displayed.  Estimated Creatinine Clearance: 58.1 mL/min (A) (by C-G formula based on SCr of 1.28 mg/dL (H)).  Assessment: 71yo male with hx DVT and strokes on warfarin PTA admitted with acute DVT.  PMH also significant for positive lupus anticoagulant and ITP.  INR 4.71 on admission.  Hematology advised to start heparin infusion. Hematology considering this a warfarin failure.  Significant events: 6/30 US venous lower right extremity positive for acute occlusive deep venous thrombosis in the popliteal vein.  Fluid collection originating in the popliteal space and extending posteriorly along the entirety of the calf to the level of the ankle also seen concerning for hematoma in setting of elevated INR.  Warfarin stopped and Vitamin K ordered for reversal. 7/2 MRI right leg: Large, complex/hemorrhagic, leaking/ruptured Baker's cyst. No mass or evidence of abscess.  Today, 10/14/2016: - Heparin level 0.20 with infusion at 1300 units/hr. Trying to obtain a narrow, conservative heparin goal of 0.30-0.45 units/mL per Dr. Virgie Dad recommendation. - Hgb and platelets low and  trending down (9.8 and 79K this morning, respectively). Hematology ordered IV Decadron today in response to this downward trend and recommended continuing IV heparin for now per today's note.  RN reports no signs of bleeding. - INR now 1.58 following Vitamin K 2.5 mg x 2 days.  - SCr improving, CrCl~58 ml/min 2nd shift 10/14/2016: Heparin level Is above goal at 0.89 after rate increased from 1300 units/hr to 1500 units/hr.  No bleeding reported.   Goal of Therapy:  Heparin level 0.3-0.45 units/ml per Dr. Jana Hakim Monitor platelets by anticoagulation protocol: Yes   Plan:  Decrease heparin drip to 1400 units/hr Recheck HL in 8 hours Daily heparin level, CBC F/u further recommendations for long-term anticoagulation per hematology  Eudelia Bunch, Pharm.D. [phone] 10/14/2016 8:50 PM

## 2016-10-14 NOTE — Progress Notes (Signed)
PT Cancellation Note  Patient Details Name: YADIER BRAMHALL MRN: [id number] DOB: [birth year]   Cancelled Treatment:    Reason Eval/Treat Not Completed: Medical issues which prohibited therapy. Heparin levels not quite therapeutic. Also pt would prefer to start therapy another day. Will continue to check back. Thanks.    Weston Anna, MPT Pager: [phone]

## 2016-10-14 NOTE — Progress Notes (Signed)
Received report from morning RN, with morning assessment unchanged. Pt denies pain at this time with no s/s of distress noted. Will continue to monitor

## 2016-10-15 DIAGNOSIS — R710 Precipitous drop in hematocrit: Secondary | ICD-10-CM

## 2016-10-15 DIAGNOSIS — M660 Rupture of popliteal cyst: Secondary | ICD-10-CM

## 2016-10-15 LAB — BASIC METABOLIC PANEL
Anion gap: 5 (ref 5–15)
BUN: 20 mg/dL (ref 6–20)
CALCIUM: 8.4 mg/dL — AB (ref 8.9–10.3)
CO2: 23 mmol/L (ref 22–32)
CREATININE: 1.24 mg/dL (ref 0.61–1.24)
Chloride: 110 mmol/L (ref 101–111)
GFR, EST NON AFRICAN AMERICAN: 57 mL/min — AB (ref >60)
Glucose, Bld: 125 mg/dL — ABNORMAL HIGH (ref 65–99)
Potassium: 4.3 mmol/L (ref 3.5–5.1)
SODIUM: 138 mmol/L (ref 135–145)

## 2016-10-15 LAB — CBC
HEMATOCRIT: 28 % — AB (ref 39.0–52.0)
HEMOGLOBIN: 9.2 g/dL — AB (ref 13.0–17.0)
MCH: 28.6 pg (ref 26.0–34.0)
MCHC: 32.9 g/dL (ref 30.0–36.0)
MCV: 87 fL (ref 78.0–100.0)
Platelets: 84 10*3/uL — ABNORMAL LOW (ref 150–400)
RBC: 3.22 MIL/uL — AB (ref 4.22–5.81)
RDW: 14.7 % (ref 11.5–15.5)
WBC: 16.5 10*3/uL — ABNORMAL HIGH (ref 4.0–10.5)

## 2016-10-15 LAB — PROTIME-INR
INR: 1.36
PROTHROMBIN TIME: 16.9 s — AB (ref 11.4–15.2)

## 2016-10-15 LAB — HEPARIN LEVEL (UNFRACTIONATED): HEPARIN UNFRACTIONATED: 1 [IU]/mL — AB (ref 0.30–0.70)

## 2016-10-15 LAB — HEPARIN INDUCED PLATELET AB (HIT ANTIBODY): HEPARIN INDUCED PLT AB: 0.114 {OD_unit} (ref 0.000–0.400)

## 2016-10-15 MED ORDER — RIVAROXABAN 20 MG PO TABS
20.0000 mg | ORAL_TABLET | Freq: Every day | ORAL | Status: DC
  Administered 2016-10-15 – 2016-10-16 (×2): 20 mg via ORAL
  Filled 2016-10-15 (×2): qty 1

## 2016-10-15 MED ORDER — FOLIC ACID 1 MG PO TABS
1.0000 mg | ORAL_TABLET | Freq: Every day | ORAL | Status: DC
  Administered 2016-10-15 – 2016-10-16 (×2): 1 mg via ORAL
  Filled 2016-10-15 (×2): qty 1

## 2016-10-15 MED ORDER — HEPARIN (PORCINE) IN NACL 100-0.45 UNIT/ML-% IJ SOLN
1300.0000 [IU]/h | INTRAMUSCULAR | Status: DC
  Administered 2016-10-15: 1300 [IU]/h via INTRAVENOUS
  Filled 2016-10-15: qty 250

## 2016-10-15 MED ORDER — SODIUM CHLORIDE 0.9 % IV SOLN
510.0000 mg | Freq: Once | INTRAVENOUS | Status: AC
  Administered 2016-10-15: 510 mg via INTRAVENOUS
  Filled 2016-10-15: qty 17

## 2016-10-15 MED ORDER — HEPARIN (PORCINE) IN NACL 100-0.45 UNIT/ML-% IJ SOLN
1300.0000 [IU]/h | INTRAMUSCULAR | Status: DC
  Filled 2016-10-15: qty 250

## 2016-10-15 NOTE — Progress Notes (Signed)
Order for stocking clarified with Dr Marin Olp and order placed.

## 2016-10-15 NOTE — Progress Notes (Signed)
Mark [name] is doing a little better. The MRI that he had done showed a ruptured Baker's cyst. This makes sense as to why he had that fluid collection in the calf.  He is still on heparin. I think we can convert him over to Xarelto. He has never been on any of the new oral anticoagulants. I think Xarelto would be a good choice for him since this is once a day.  His platelet count has come up a little bit. He typically responds to Decadron.  He's had something about possibility of going to a rehabilitation facility. I do not believe this is necessary. He has always done physical therapy at home. If he has done outpatient physical therapy, he's done neuro physical therapy. He has a very good physical therapist at the outpatient physical therapy Center.  I think that a compression stocking may not be a bad idea for his right leg.  I have noted that his hemoglobin has dropped about 3 points since he came in. Some of this might be hydration. So it might be the bleeding. I would probably give him a dose of iron and get him on some folic acid.  He is afebrile. There really is no change on his physical exam. Still has some swelling with the right leg. The ecchymoses in the back of the right lower leg seems to be improving.  Unfortunately, I probably would have to watch him another day to make sure that his blood count is not keep dropping. Hopefully if all looks good, he may discharged tomorrow.  Lattie Haw, MD

## 2016-10-15 NOTE — Evaluation (Signed)
Physical Therapy Evaluation Patient Details Name: Mark [name] MRN: [id number] DOB: 1945-05-26 Today's Date: 10/15/2016   History of Present Illness  71 yo male admitted with acute DVT, R LE ruptured baker's cyst. Hx of CVA with L residual weakness, lupus, CKD, DVT, Hep, Sz d/o, CAD    Clinical Impression  On eval, pt was Min guard assist for mobility. He walked ~125 feet with a straight cane. Pt rated pain 4/10 with activity. He tolerated activity well. Discussed d/c plan-pt will return home with his wife. Wife present towards end of session and observed pt's mobility. Discussed f/u therapy-pt and wife feel they can manage getting to and from OP PT.     Follow Up Recommendations Outpatient PT    Equipment Recommendations  None recommended by PT    Recommendations for Other Services       Precautions / Restrictions Precautions Precautions: Fall Restrictions Weight Bearing Restrictions: No      Mobility  Bed Mobility Overal bed mobility: Needs Assistance Bed Mobility: Supine to Sit;Sit to Supine     Supine to sit: Modified independent (Device/Increase time);HOB elevated Sit to supine: Modified independent (Device/Increase time);HOB elevated      Transfers Overall transfer level: Needs assistance Equipment used: Straight cane Transfers: Sit to/from Stand Sit to Stand: Min guard         General transfer comment: close guard for safety.   Ambulation/Gait Ambulation/Gait assistance: Min guard Ambulation Distance (Feet): 125 Feet Assistive device: Straight cane Gait Pattern/deviations: Steppage;Step-to pattern     General Gait Details: close guard for safety. Pt tolerated distance well.   Stairs            Wheelchair Mobility    Modified Rankin (Stroke Patients Only)       Balance Overall balance assessment: Needs assistance           Standing balance-Leahy Scale: Fair Standing balance comment: Pt was able to bend over and pick up object  from floor without use of cane or LOB                             Pertinent Vitals/Pain Pain Assessment: 0-10 Pain Score: 4  Pain Location: R LE Pain Descriptors / Indicators: Discomfort;Aching;Sore Pain Intervention(s): Monitored during session;Repositioned    Home Living Family/patient expects to be discharged to:: Private residence Living Arrangements: Spouse/significant other Available Help at Discharge: Family Type of Home: House Home Access: Stairs to enter Entrance Stairs-Rails: Right Entrance Stairs-Number of Steps: 3 Home Layout: One level Home Equipment: Cane - single point      Prior Function Level of Independence: Independent with assistive device(s)         Comments: uses cane for ambulation     Hand Dominance        Extremity/Trunk Assessment   Upper Extremity Assessment Upper Extremity Assessment:  (no functional use of L UE since CVA)    Lower Extremity Assessment Lower Extremity Assessment: LLE deficits/detail LLE Deficits / Details: drop foot L LE. Hip, knee strength at least 3/5    Cervical / Trunk Assessment Cervical / Trunk Assessment: Normal  Communication   Communication: No difficulties  Cognition Arousal/Alertness: Awake/alert Behavior During Therapy: WFL for tasks assessed/performed Overall Cognitive Status: Within Functional Limits for tasks assessed  General Comments      Exercises     Assessment/Plan    PT Assessment Patient needs continued PT services  PT Problem List Decreased mobility;Pain       PT Treatment Interventions Gait training;DME instruction;Therapeutic activities;Therapeutic exercise;Patient/family education;Functional mobility training    PT Goals (Current goals can be found in the Care Plan section)  Acute Rehab PT Goals Patient Stated Goal: regain PLOF. home soon PT Goal Formulation: With patient/family Time For Goal Achievement:  10/29/16 Potential to Achieve Goals: Good    Frequency Min 3X/week   Barriers to discharge        Co-evaluation               AM-PAC PT "6 Clicks" Daily Activity  Outcome Measure Difficulty turning over in bed (including adjusting bedclothes, sheets and blankets)?: None Difficulty moving from lying on back to sitting on the side of the bed? : None Difficulty sitting down on and standing up from a chair with arms (e.g., wheelchair, bedside commode, etc,.)?: A Little Help needed moving to and from a bed to chair (including a wheelchair)?: A Little Help needed walking in hospital room?: A Little Help needed climbing 3-5 steps with a railing? : A Little 6 Click Score: 20    End of Session Equipment Utilized During Treatment: Gait belt Activity Tolerance: Patient tolerated treatment well Patient left: in bed;with call bell/phone within reach;with family/visitor present   PT Visit Diagnosis: Muscle weakness (generalized) (M62.81);Difficulty in walking, not elsewhere classified (R26.2)    Time: 1434-1500 PT Time Calculation (min) (ACUTE ONLY): 26 min   Charges:   PT Evaluation $PT Eval Low Complexity: 1 Procedure PT Treatments $Gait Training: 8-22 mins   PT G Codes:          Weston Anna, MPT Pager: [phone]

## 2016-10-15 NOTE — Progress Notes (Signed)
ANTICOAGULATION CONSULT NOTE - Follow Up Consult  Pharmacy Consult for Heparin Indication: VTE Treatment  No Known Allergies  Patient Measurements: Height: 6' (182.9 cm) Weight: 200 lb (90.7 kg) IBW/kg (Calculated) : 77.6 Heparin Dosing Weight:   Vital Signs: Temp: 99 F (37.2 C) (07/03 0512) Temp Source: Oral (07/03 0512) BP: 115/66 (07/03 0512) Pulse Rate: 73 (07/03 0512)  Labs:  Recent Labs  10/12/16 1125  10/13/16 0449  10/13/16 1701  10/14/16 0423 10/14/16 1022 10/14/16 1948 10/15/16 0449  HGB 12.3*  < > 10.7*  --   --   --  9.8*  --   --  9.2*  HCT 35.9*  < > 32.3*  --   --   --  29.2*  --   --  28.0*  PLT 130*  < > 104*  --   --   --  79*  --   --  84*  LABPROT 45.6*  --  36.3*  --  23.9*  --  19.1*  --   --  16.9*  INR 4.71*  --  3.54  --  2.10  --  1.58  --   --  1.36  HEPARINUNFRC  --   --   --   < >  --   < >  --  0.20* 0.89* 1.00*  CREATININE 1.71*  --  1.32*  --   --   --  1.28*  --   --   --   < > = values in this interval not displayed.  Estimated Creatinine Clearance: 58.1 mL/min (A) (by C-G formula based on SCr of 1.28 mg/dL (H)).   Medications:  Infusions:  . sodium chloride 100 mL/hr at 10/15/16 0351  . dexamethasone (DECADRON) IVPB [facility] Stopped (10/14/16 1549)  . heparin      Assessment: Patient with high heparin level.  No heparin issues per RN.  Patient's levels compared with the infusion rate, appears to show very narrow range.  Prior 1300 units/hr equalled below goal.    Goal of Therapy:  Heparin level 0.3-0.45 units/ml per Dr. Jana Hakim Monitor platelets by anticoagulation protocol: Yes   Plan:  Hold heparin x 71mins At 0600 restart heparin at 1300 units/hr Recheck level at [address], [place] 10/15/2016,5:31 AM

## 2016-10-15 NOTE — Discharge Instructions (Signed)
Information on my medicine - XARELTO (rivaroxaban)  WHY WAS XARELTO PRESCRIBED FOR YOU? Xarelto was prescribed to treat blood clots that may have been found in the veins of your legs (deep vein thrombosis) or in your lungs (pulmonary embolism) and to reduce the risk of them occurring again.  What do you need to know about Xarelto? Your hematologist has prescribed for you to take one 20 mg tablet ONCE A DAY with a meal.  DO NOT stop taking Xarelto without talking to the health care provider who prescribed the medication.  Refill your prescription for 20 mg tablets before you run out.  After discharge, you should have regular check-up appointments with your healthcare provider that is prescribing your Xarelto.  In the future your dose may need to be changed if your kidney function changes by a significant amount.  What do you do if you miss a dose? If you are taking Xarelto ONCE DAILY and you miss a dose, take it as soon as you remember on the same day then continue your regularly scheduled once daily regimen the next day. Do not take two doses of Xarelto at the same time.   Important Safety Information Xarelto is a blood thinner medicine that can cause bleeding. You should call your healthcare provider right away if you experience any of the following: ? Bleeding from an injury or your nose that does not stop. ? Unusual colored urine (red or dark brown) or unusual colored stools (red or black). ? Unusual bruising for unknown reasons. ? A serious fall or if you hit your head (even if there is no bleeding).  Some medicines may interact with Xarelto and might increase your risk of bleeding while on Xarelto. To help avoid this, consult your healthcare provider or pharmacist prior to using any new prescription or non-prescription medications, including herbals, vitamins, non-steroidal anti-inflammatory drugs (NSAIDs) and supplements.  This website has more information on Xarelto:  [email or web address]

## 2016-10-15 NOTE — Progress Notes (Signed)
PROGRESS NOTE  Mark [name] OXB:[id number] DOB: [birth year] DOA: 10/12/2016 PCP: Harlan Stains, MD   Brief summary:  H/o left hemiplegia from prior cva presented with right leg pain, found to have acute DVT and ruptured baker's cyst on right leg.  History of lupus anticoagulant.  New DVT in the setting of INR of 4.7.  Hematology /oncology consulted, now changed to NOAC  Thrombocytopenia, on steroid for possible ITP flare up, possible d/c in 24-48hrs pending plt number.    HPI/Recap of past 24 hours:  He had one time fever on admission, none since  Today he report feeling much better, he is able to walk without right leg pain Wife and grandson in room   Assessment/Plan: Principal Problem:   DVT (deep venous thrombosis) (HCC) Active Problems:   Idiopathic thrombocytopenic purpura (HCC)   Ischemic stroke (HCC)   Hemiparesis, left (HCC)   LA (lupus anticoagulant) disorder (HCC)   Supratherapeutic INR   Acute deep vein thrombosis (DVT) of popliteal vein of right lower extremity (HCC)   AKI (acute kidney injury) (HCC)   Fever   Leukocytosis   Bacteriuria   Acute DVT (right lower extremity) with history of positive lupus anticoagulant, but elevated INR with questionable calf hematoma right lower leg --HOLD warfarin, s/p vitamin k x2 per hematology recommendation --oncall hematology initially recommended possible IVC filter, IR consulted, Dr Marin Olp, patient primary hematology, recommend against IVD filter,  --he is started  on heparin drip since admission, today he is transitioned to xarelto per Dr Antonieta Pert recommendation.  Abnormal fluid collection on ultrasound ----s/p vitk for possible hematoma on admission,  --He takes a low dose of vitamin K daily at baseline but still comes in with warfarin toxicity --Dr Eulas Post discussed with on call ortho surgeon Dr Veverly Fells who recommend MRI,  -mri right leg result as below, I have discussed with [facility] ortho Dr Norris's PA on  7/2 who reviewed MRI and  recommend conservative management, no surgery recommended.  MRI :Large, complex/hemorrhagic, leaking/ruptured Baker's cyst. No mass or evidence of abscess.  Diffuse fatty change involving calf muscles bilaterally likely denervation process or remote diabetic muscle infarct.  Bilateral knee joint effusions, right greater than left. No MR findings to suggest septic arthritis or osteomyelitis.  AKI/bacteriuria/urinary retention required in and out cath in the ED --with feverx1 , leukocytosis on presentation,  -S/p rocephin x1 dose on 7/1,he received hydration -urine culture and blood culture no growth, abx stopped. --cr 1.7 on admission, cr back to baseline since 7/2, d/c ivf  Fever 101.2 on admission --Could be related to the DVT.  ? uti with urinary retention and leukocytosis --he is currently stable;  does not look septic. -s/p  rocephinx1, culture on growth, fever subsided, monitor off abx.  History of ITP/ worsening thrombocytopenia -consumption for acute DVT / ITP flares /HIT?  -  HIT ab negative, serotonin release test pending, he is off heparin drip -Hematology consulted, started on decadron for possible ITP flare up -plt improving   HTN --Amlodipine  History of CVA, with left hemiparesis --HOLD aspirin for now since he is on IV heparin and has supratherapeutic INR --Statin  H/o seizure: continue keppra  BPH/h/o urinary retention --Flomax, bethanechol  Mood disorder --Wellbutrin, Zoloft Stable, pleasant  GERD --Pepcid   FTT: patient has baseline hemiparesis, now right leg can barely bear weight due to pain, PT eval, he is improving , likely able to have outpatient PT  DVT prophylaxis: xarelto Code Status: FULL Family Communication:  wife  updated  Disposition Plan: home in 24-48hrs, need hematology clearance  Consultants:  Hematology oncology  Interventional radiology  Orthopedics (Dr Eulas Post talked to Dr Veverly Fells over the  phone, I talked to Dr Veverly Fells' pa on 7/2)  Procedures:  none  Antibiotics:  Rocephin from 7/1   Objective: BP 117/69 (BP Location: Left Arm)   Pulse 72   Temp 98.5 F (36.9 C) (Oral)   Resp 18   Ht 6' (1.829 m)   Wt 90.7 kg (200 lb)   SpO2 98%   BMI 27.12 kg/m   Intake/Output Summary (Last 24 hours) at 10/15/16 1909 Last data filed at 10/15/16 1808  Gross per 24 hour  Intake          3455.33 ml  Output                0 ml  Net          3455.33 ml   Filed Weights   10/12/16 1012  Weight: 90.7 kg (200 lb)    Exam:   General:  NAD  Cardiovascular: RRR  Respiratory: CTABL  Abdomen: Soft/ND/NT, positive BS  Musculoskeletal: No Edema  Neuro: aaox3, residual left sided weakness from remote cva  Skin: petechiae?   Data Reviewed: Basic Metabolic Panel:  Recent Labs Lab 10/12/16 1125 10/13/16 0449 10/14/16 0423 10/15/16 0449  NA 138 140 139 138  K 4.4 4.2 3.9 4.3  CL 104 109 112* 110  CO2 24 24 20* 23  GLUCOSE 115* 95 94 125*  BUN 39* 29* 24* 20  CREATININE 1.71* 1.32* 1.28* 1.24  CALCIUM 9.0 8.6* 8.1* 8.4*   Liver Function Tests:  Recent Labs Lab 10/12/16 1125  AST 20  ALT 34  ALKPHOS 56  BILITOT 0.7  PROT 6.8  ALBUMIN 3.9   No results for input(s): LIPASE, AMYLASE in the last 168 hours. No results for input(s): AMMONIA in the last 168 hours. CBC:  Recent Labs Lab 10/12/16 1125 10/12/16 2053 10/13/16 0449 10/14/16 0423 10/15/16 0449  WBC 12.4* 13.0* 12.2* 12.1* 16.5*  NEUTROABS 7.3  --   --   --   --   HGB 12.3* 11.4* 10.7* 9.8* 9.2*  HCT 35.9* 33.8* 32.3* 29.2* 28.0*  MCV 89.3 87.3 86.8 87.7 87.0  PLT 130* 120* 104* 79* 84*   Cardiac Enzymes:   No results for input(s): CKTOTAL, CKMB, CKMBINDEX, TROPONINI in the last 168 hours. BNP (last 3 results) No results for input(s): BNP in the last 8760 hours.  ProBNP (last 3 results) No results for input(s): PROBNP in the last 8760 hours.  CBG: No results for input(s): GLUCAP  in the last 168 hours.  Recent Results (from the past 240 hour(s))  Blood Culture (routine x 2)     Status: None (Preliminary result)   Collection Time: 10/12/16 11:25 AM  Result Value Ref Range Status   Specimen Description BLOOD RIGHT ANTECUBITAL  Final   Special Requests   Final    BOTTLES DRAWN AEROBIC AND ANAEROBIC Blood Culture adequate volume   Culture   Final    NO GROWTH 3 DAYS Performed at [facility], [address], [place], [place] [address]    Report Status PENDING  Incomplete  Blood Culture (routine x 2)     Status: None (Preliminary result)   Collection Time: 10/12/16 11:45 AM  Result Value Ref Range Status   Specimen Description BLOOD LEFT ANTECUBITAL  Final   Special Requests IN PEDIATRIC BOTTLE Blood Culture adequate  volume  Final   Culture   Final    NO GROWTH 3 DAYS Performed at [facility], [place] [address] [place], [place] [address]    Report Status PENDING  Incomplete  Urine culture     Status: None   Collection Time: 10/12/16  2:05 PM  Result Value Ref Range Status   Specimen Description URINE, CATHETERIZED  Final   Special Requests NONE  Final   Culture   Final    NO GROWTH Performed at [facility], [address], [place], [place] [address]    Report Status 10/13/2016 FINAL  Final     Studies: No results found.  Scheduled Meds: . amLODipine  2.5 mg Oral QHS  . bethanechol  12.5 mg Oral BID  . buPROPion  150 mg Oral Daily  . famotidine  40 mg Oral BID  . folic acid  1 mg Oral Daily  . levETIRAcetam  1,500 mg Oral BID  . rivaroxaban  20 mg Oral Q breakfast  . rosuvastatin  20 mg Oral QHS  . sertraline  200 mg Oral Daily  . tamsulosin  0.8 mg Oral QHS    Continuous Infusions: . dexamethasone (DECADRON) IVPB [facility] Stopped (10/15/16 8478)     Time spent: 20mins  Ezequiel Macauley MD, PhD  [facility] Pager [phone]. If 7PM-7AM, please contact night-coverage at [email or web address], password [facility] 10/15/2016, 7:09 PM  LOS: 3  days

## 2016-10-16 DIAGNOSIS — I82431 Acute embolism and thrombosis of right popliteal vein: Principal | ICD-10-CM

## 2016-10-16 DIAGNOSIS — N179 Acute kidney failure, unspecified: Secondary | ICD-10-CM

## 2016-10-16 DIAGNOSIS — D693 Immune thrombocytopenic purpura: Secondary | ICD-10-CM

## 2016-10-16 DIAGNOSIS — D72829 Elevated white blood cell count, unspecified: Secondary | ICD-10-CM

## 2016-10-16 DIAGNOSIS — R509 Fever, unspecified: Secondary | ICD-10-CM

## 2016-10-16 DIAGNOSIS — R791 Abnormal coagulation profile: Secondary | ICD-10-CM

## 2016-10-16 LAB — BASIC METABOLIC PANEL
ANION GAP: 9 (ref 5–15)
BUN: 25 mg/dL — ABNORMAL HIGH (ref 6–20)
CALCIUM: 8.9 mg/dL (ref 8.9–10.3)
CO2: 22 mmol/L (ref 22–32)
CREATININE: 1.18 mg/dL (ref 0.61–1.24)
Chloride: 110 mmol/L (ref 101–111)
Glucose, Bld: 132 mg/dL — ABNORMAL HIGH (ref 65–99)
Potassium: 4.4 mmol/L (ref 3.5–5.1)
Sodium: 141 mmol/L (ref 135–145)

## 2016-10-16 LAB — CBC
HCT: 26.8 % — ABNORMAL LOW (ref 39.0–52.0)
Hemoglobin: 9.1 g/dL — ABNORMAL LOW (ref 13.0–17.0)
MCH: 30 pg (ref 26.0–34.0)
MCHC: 34 g/dL (ref 30.0–36.0)
MCV: 88.4 fL (ref 78.0–100.0)
Platelets: 111 K/uL — ABNORMAL LOW (ref 150–400)
RBC: 3.03 MIL/uL — ABNORMAL LOW (ref 4.22–5.81)
RDW: 15.1 % (ref 11.5–15.5)
WBC: 19.3 K/uL — ABNORMAL HIGH (ref 4.0–10.5)

## 2016-10-16 LAB — MAGNESIUM: Magnesium: 2.2 mg/dL (ref 1.7–2.4)

## 2016-10-16 MED ORDER — RIVAROXABAN 20 MG PO TABS: 20.0000 mg | ORAL_TABLET | Freq: Every day | ORAL | 1 refills | Status: DC

## 2016-10-16 MED ORDER — ASPIRIN 81 MG PO CHEW: 81.0000 mg | CHEWABLE_TABLET | Freq: Every day | ORAL | Status: DC

## 2016-10-16 MED ORDER — DEXAMETHASONE 4 MG PO TABS: ORAL_TABLET | ORAL | 2 refills | Status: DC

## 2016-10-16 NOTE — Progress Notes (Signed)
Mark [name] is doing okay. He walked yesterday. He didn't have much pain in the right leg.  His blood counts are holding steady. He has his platelet count now of 111,000. His hemoglobin is 9.1. His white cell count is elevated because of steroids.  He has a compression stocking on the right leg now. The right leg does not look as swollen. He really needs to have a measured compression stocking that might be a little bit tighter. I probably will have to give him a prescription when I see him as an outpatient.  He has had no obvious bleeding.  He got iron and folic acid yesterday.  On his exam, his vital signs are all stable. His temperature is 97.3. Blood pressure 151/81. Pulse is 61. His lungs are clear. Cardiac exam regular rate and rhythm. He has no murmurs. Abdomen is soft. There is no palpable liver or spleen tip. Extremities-show the swelling in the right lower leg. He has a compression stocking on. It does not look or feel as full. There is no tenderness to palpation of the right leg. Neurological exam is unremarkable.  I think that everything is pretty stable. I think that he could go home. He is ambulating. His wife is incredibly attentive. She will be very careful with him.  He clearly needs outpatient physical therapy. Again, he has had this in the past at the [facility].  I will plan to see him back early next week.  He would appreciate being able to oh home and enjoy July 4 at home.  As always, and staff on [address] done a tremendous job with him.  Lattie Haw, MD  Mark [name] 55:6

## 2016-10-16 NOTE — Progress Notes (Signed)
Reviewed AVS and discharge summary with pt. Questions/concerns answered to pt satisfaction. Pt discharged in stable condition w/belongings via [facility] home w/wife and son in law.

## 2016-10-16 NOTE — Progress Notes (Signed)
Physical Therapy Treatment Patient Details Name: Mark [name] MRN: [id number] DOB: 09-Sep-1945 Today's Date: 10/16/2016    History of Present Illness 71 yo male admitted with acute DVT, R LE ruptured baker's cyst. Hx of CVA with L residual weakness, lupus, CKD, DVT, Hep, Sz d/o, CAD    PT Comments    Pt progressed to ambulating 320 feet with a single point cane. Pt was a little unsteady upon initiation of treatment but improved as therapy continued. The MD entered room and patient likely to be discharged to home today.  Follow Up Recommendations  Outpatient PT     Equipment Recommendations  None recommended by PT    Recommendations for Other Services       Precautions / Restrictions Precautions Precautions: Fall Restrictions Weight Bearing Restrictions: No    Mobility  Bed Mobility Overal bed mobility: Modified Independent Bed Mobility: Supine to Sit     Supine to sit: HOB elevated;Modified independent (Device/Increase time);Min guard        Transfers Overall transfer level: Needs assistance Equipment used: Straight cane Transfers: Sit to/from Stand Sit to Stand: Min guard         General transfer comment: close guard for safety.   Ambulation/Gait Ambulation/Gait assistance: Min guard Ambulation Distance (Feet): 320 Feet Assistive device: Straight cane Gait Pattern/deviations: Step-to pattern;Steppage     General Gait Details: close guard for safety. Pt progressed distance and tolerated treatment well.   Stairs            Wheelchair Mobility    Modified Rankin (Stroke Patients Only)       Balance                                            Cognition Arousal/Alertness: Awake/alert Behavior During Therapy: WFL for tasks assessed/performed Overall Cognitive Status: Within Functional Limits for tasks assessed                                        Exercises      General Comments        Pertinent  Vitals/Pain Pain Assessment: 0-10 Pain Score: 4  Pain Location: R LE Pain Descriptors / Indicators: Discomfort Pain Intervention(s): Monitored during session;Repositioned    Home Living                      Prior Function            PT Goals (current goals can now be found in the care plan section) Acute Rehab PT Goals Patient Stated Goal: regain PLOF. home soon PT Goal Formulation: With patient/family Time For Goal Achievement: 10/29/16 Potential to Achieve Goals: Good Progress towards PT goals: Progressing toward goals    Frequency    Min 3X/week      PT Plan Current plan remains appropriate    Co-evaluation              AM-PAC PT "6 Clicks" Daily Activity  Outcome Measure  Difficulty turning over in bed (including adjusting bedclothes, sheets and blankets)?: None Difficulty moving from lying on back to sitting on the side of the bed? : None Difficulty sitting down on and standing up from a chair with arms (e.g., wheelchair, bedside commode, etc,.)?: A Little Help needed moving to  and from a bed to chair (including a wheelchair)?: A Little Help needed walking in hospital room?: A Little Help needed climbing 3-5 steps with a railing? : A Little 6 Click Score: 20    End of Session Equipment Utilized During Treatment: Gait belt Activity Tolerance: Patient tolerated treatment well Patient left: in chair;with call bell/phone within reach;with family/visitor present (MD in room) Nurse Communication: Mobility status PT Visit Diagnosis: Muscle weakness (generalized) (M62.81);Difficulty in walking, not elsewhere classified (R26.2)     Time: 7893-8101 PT Time Calculation (min) (ACUTE ONLY): 15 min  Charges:  $Gait Training: 8-22 mins                    G Codes:       Olegario Shearer, SPT   Reino Bellis 10/16/2016, 12:52 PM

## 2016-10-16 NOTE — Discharge Summary (Signed)
Physician Discharge Summary  RONEL RODEHEAVER YDX:[id number] DOB: January 22, 1946 DOA: 10/12/2016  PCP: Harlan Stains, MD  Admit date: 10/12/2016 Discharge date: 10/16/2016  Admitted From: home Disposition:  Home  Recommendations for Outpatient Follow-up:  1. Follow up with PCP in 1-2 weeks 2. Please obtain BMP/CBC in one week   Home Health:No Equipment/Devices:None  Discharge Condition:stable CODE STATUS:full Diet recommendation: Heart Healthy  Brief/Interim Summary: 71 y.o. gentleman with a history of positive lupus anticoagulant, CVA with left sided hemiparesis (though he is still able to ambulate with a cane at baseline), history of recurrent falls and gait instability, epilepsy (controlled on Keppra), CAD S/P remote angioplasty after MI (in the 90's), ITP, HTN, and recurrent DVT who presented to the ED in [facility] for evaluation of two days of progressive pain and swelling in his right calf.  He had chest tightness one week ago but denies chest pain or pleurisy currently.  No syncope or LOC.    Discharge Diagnoses:  Acute DVT (deep venous thrombosis) ([place]) Despite an elevated INR, hematology oncology was consulted. The recommended to hold warfarin he was given vitamin K. Hematology recommended against IVC filter. He was started on IV heparin after his hemoglobin stabilized and they recommended to start him on Xarelto. It was personally discussed with the oncologist Dr. Jonette Eva the use of Xarelto in this setting.  Abnormal fluid collection on ultrasound: Status post vitamin K for possible hematoma. MRI of the left lower extremity showed Large, complex/hemorrhagic, leaking/ruptured Baker's cyst. Which has remained stable he has not complained with any further pain.  Acute kidney injury: There is ancillary fluid hydration and cultures were negative.  Fever on admission: Likely related to DVT and or hematoma.  History of ITP: Hit panel negative he is on Xarelto, hematology was  consulted who recommended Decadron for 4 days due to his ITP flareup.  Essential hypertension: No changes were made.  History of CVA with left hemiparesis: Aspirin on hold, continue statins.  BPH: Continue Flomax.  Mood disorder: No changes are made to his medication.  Discharge Instructions  Discharge Instructions    Ambulatory referral to Physical Therapy    Complete by:  As directed    Diet - low sodium heart healthy    Complete by:  As directed    Increase activity slowly    Complete by:  As directed      Allergies as of 10/16/2016   No Known Allergies     Medication List    STOP taking these medications   VITAMIN K PO   warfarin 2 MG tablet Commonly known as:  COUMADIN     TAKE these medications   alendronate 70 MG tablet Commonly known as:  FOSAMAX Take 70 mg by mouth every Sunday. Take with a full glass of water on an empty stomach.   amLODipine 2.5 MG tablet Commonly known as:  NORVASC Take 2.5 mg by mouth at bedtime.   aspirin 81 MG chewable tablet Chew 1 tablet (81 mg total) by mouth at bedtime. Start taking on:  10/23/2016 What changed:  These instructions start on 10/23/2016. If you are unsure what to do until then, ask your doctor or other care provider.   BENADRYL 25 MG tablet Generic drug:  diphenhydrAMINE Take 25-50 mg by mouth every 6 (six) hours as needed for allergies.   bethanechol 25 MG tablet Commonly known as:  URECHOLINE Take 12.5 mg by mouth 2 (two) times daily.   buPROPion 150 MG 24 hr tablet Commonly  known as:  WELLBUTRIN XL Take 150 mg by mouth daily.   CRESTOR 20 MG tablet Generic drug:  rosuvastatin Take 20 mg by mouth at bedtime.   dexamethasone 4 MG tablet Commonly known as:  DECADRON TAKE 10 TABLET (40MG ) BY MOUTH DAILY FOR 2 DAYS THEN STOP What changed:  See the new instructions.   famotidine 40 MG tablet Commonly known as:  PEPCID Take 40 mg by mouth 2 (two) times daily.   fluticasone 50 MCG/ACT nasal  spray Commonly known as:  FLONASE Place 2 sprays into both nostrils daily as needed for rhinitis.   levETIRAcetam 500 MG tablet Commonly known as:  KEPPRA Take 1,500 mg by mouth 2 (two) times daily. 3 tabs  in am and 3 tabs in pm   multivitamin with minerals tablet Take 1 tablet by mouth 2 (two) times daily.   NYSTATIN (TOPICAL) Powd Apply 1 application topically 2 (two) times daily. What changed:  when to take this  reasons to take this   rivaroxaban 20 MG Tabs tablet Commonly known as:  XARELTO Take 1 tablet (20 mg total) by mouth daily with breakfast. Start taking on:  10/17/2016   sertraline 100 MG tablet Commonly known as:  ZOLOFT Take 200 mg by mouth daily.   tamsulosin 0.4 MG Caps capsule Commonly known as:  FLOMAX Take 0.8 mg by mouth at bedtime.   traMADol 50 MG tablet Commonly known as:  ULTRAM Take 50 mg by mouth every 6 (six) hours as needed (pain).   TYLENOL 500 MG tablet Generic drug:  acetaminophen Take 1,000 mg by mouth every 8 (eight) hours as needed (pain).   Vitamin D3 1000 units Caps Take 2,000 Units by mouth 2 (two) times daily after a meal.      Follow-up Information    Netta Cedars, MD Follow up.   Specialty:  Orthopedic Surgery Why:  baker's cyct and knee joint effusion Contact information: [address] [place] [address] [phone]          No Known Allergies  Consultations:  Hematology oncology   Procedures/Studies: Dg Chest 2 View  Result Date: 10/12/2016 CLINICAL DATA:  Cough and fever. EXAM: CHEST  2 VIEW COMPARISON:  None. FINDINGS: The heart size and mediastinal contours are within normal limits. Both lungs are clear. No evidence of pleural effusion. Several old right rib fracture deformities again noted. Old lower thoracic vertebral body compression fractures with previous vertebroplasties again noted . IMPRESSION: No active cardiopulmonary disease. Electronically Signed   By: Earle Gell M.D.    On: 10/12/2016 12:21   Dg Chest 2 View  Result Date: 10/10/2016 CLINICAL DATA:  Chronic cough after eating. EXAM: CHEST  2 VIEW COMPARISON:  11/16/2015 FINDINGS: Heart size is normal. There is aortic atherosclerosis. The lungs are clear. No evidence of aspiration pneumonia. No effusions. Old augmented lower thoracic compression fractures again noted. IMPRESSION: No active disease.  No aspiration pneumonia. Electronically Signed   By: Nelson Chimes M.D.   On: 10/10/2016 13:55   Dg Op Swallowing Func-medicare/speech Path  Result Date: 10/10/2016 Objective Swallowing Evaluation: Type of Study: MBS-Modified Barium Swallow Study Patient Details Name: OWYN RAULSTON MRN: [id number] Date of Birth: [birth year] Today's Date: 10/10/2016 Time: SLP Start Time (ACUTE ONLY): 1125-SLP Stop Time (ACUTE ONLY): 1150 SLP Time Calculation (min) (ACUTE ONLY): 25 min Past Medical History: No past medical history on file. Past Surgical History: No past surgical history on file. HPI: 71 yr old suffered a right thrombotic  CVA with residual left sided weakness approximately 2 years ago. Additional PMH refractory immune thrombocytopenia, lupus anticoagulant disorder, DVT, sleep apnea, dysphonia. Pt comes in for outpatient MBS with symptoms including coughing during but mostly after meals, eructation, difficulty swallowing pills and pharyngeal globus sensation.  No Data Recorded Assessment / Plan / Recommendation CHL IP CLINICAL IMPRESSIONS 10/10/2016 Clinical Impression Pt exhibits a mild sensorimotor oropharyngeal dysphagia marked by mild and intermittent premature spill due to decreased oral control and cohesion. Sensory deficits impacted initiation of swallow evidenced by intermittent delay to pyriform sinuses. Laryngeal motor weakness resulted in mildly decreased laryngeal elevation and epiglottic inversion with penetration unsensed but effective throat clearing with cues for. No laryneal intrusion while performing a chin tuck over  repeated trials. Unremarkable oropharyngeal and esophageal (MBS does not diagnose esophageal deficits below UES) transit with pill whole in applesauce. No difficulty with multi-consistency texture although he is at greater risk given mild oral deficits. Recommend regular texture (use caution with dry, crumbly food, multi-consistency), thin liquids using chin tuck strategy, pills whole in applesauce. Did not disuss with pt however if symptoms are not improved by compensatory strategies and dysphagia worsens, he may benefit from outpatient ST for motor strengthening.         SLP Visit Diagnosis Dysphagia, oropharyngeal phase (R13.12) Attention and concentration deficit following -- Frontal lobe and executive function deficit following -- Impact on safety and function (No Data)   CHL IP TREATMENT RECOMMENDATION 10/10/2016 Treatment Recommendations No treatment recommended at this time   No flowsheet data found. CHL IP DIET RECOMMENDATION 10/10/2016 SLP Diet Recommendations Regular solids;Thin liquid Liquid Administration via Cup Medication Administration Whole meds with puree Compensations Slow rate;Small sips/bites;Chin tuck;Follow solids with liquid Postural Changes Seated upright at 90 degrees;Remain semi-upright after after feeds/meals (Comment)   CHL IP OTHER RECOMMENDATIONS 10/10/2016 Recommended Consults -- Oral Care Recommendations Oral care BID Other Recommendations --   CHL IP FOLLOW UP RECOMMENDATIONS 10/10/2016 Follow up Recommendations None   No flowsheet data found.     CHL IP ORAL PHASE 10/10/2016 Oral Phase Impaired Oral - Pudding Teaspoon -- Oral - Pudding Cup -- Oral - Honey Teaspoon -- Oral - Honey Cup -- Oral - Nectar Teaspoon -- Oral - Nectar Cup -- Oral - Nectar Straw -- Oral - Thin Teaspoon -- Oral - Thin Cup Premature spillage Oral - Thin Straw -- Oral - Puree -- Oral - Mech Soft -- Oral - Regular -- Oral - Multi-Consistency -- Oral - Pill -- Oral Phase - Comment --  CHL IP PHARYNGEAL PHASE 10/10/2016  Pharyngeal Phase Impaired Pharyngeal- Pudding Teaspoon -- Pharyngeal -- Pharyngeal- Pudding Cup -- Pharyngeal -- Pharyngeal- Honey Teaspoon -- Pharyngeal -- Pharyngeal- Honey Cup -- Pharyngeal -- Pharyngeal- Nectar Teaspoon -- Pharyngeal -- Pharyngeal- Nectar Cup -- Pharyngeal -- Pharyngeal- Nectar Straw -- Pharyngeal -- Pharyngeal- Thin Teaspoon -- Pharyngeal -- Pharyngeal- Thin Cup Delayed swallow initiation-pyriform sinuses;Reduced laryngeal elevation;Reduced airway/laryngeal closure;Penetration/Aspiration during swallow Pharyngeal Material enters airway, remains ABOVE vocal cords and not ejected out Pharyngeal- Thin Straw -- Pharyngeal -- Pharyngeal- Puree -- Pharyngeal -- Pharyngeal- Mechanical Soft -- Pharyngeal -- Pharyngeal- Regular WFL Pharyngeal -- Pharyngeal- Multi-consistency WFL Pharyngeal -- Pharyngeal- Pill WFL Pharyngeal -- Pharyngeal Comment --  CHL IP CERVICAL ESOPHAGEAL PHASE 10/10/2016 Cervical Esophageal Phase WFL Pudding Teaspoon -- Pudding Cup -- Honey Teaspoon -- Honey Cup -- Nectar Teaspoon -- Nectar Cup -- Nectar Straw -- Thin Teaspoon -- Thin Cup -- Thin Straw -- Puree -- Mechanical Soft -- Regular -- Multi-consistency --  Pill -- Cervical Esophageal Comment -- CHL IP GO 10/10/2016 Functional Assessment Tool Used skilled clinical judgement Functional Limitations Swallowing Swallow Current Status (N4709) CJ Swallow Goal Status (G2836) CJ Swallow Discharge Status (O2947) CJ Motor Speech Current Status [phone]) (None) Motor Speech Goal Status (K3546) (None) Motor Speech Goal Status (F6812) (None) Spoken Language Comprehension Current Status (X5170) (None) Spoken Language Comprehension Goal Status (Y1749) (None) Spoken Language Comprehension Discharge Status [phone]) (None) Spoken Language Expression Current Status [phone]) (None) Spoken Language Expression Goal Status (W4665) (None) Spoken Language Expression Discharge Status [phone]) (None) Attention Current Status (S1779) (None) Attention Goal  Status (T9030) (None) Attention Discharge Status [phone]) (None) Memory Current Status (A0762) (None) Memory Goal Status (U6333) (None) Memory Discharge Status (L4562) (None) Voice Current Status (B6389) (None) Voice Goal Status (H7342) (None) Voice Discharge Status (A7681) (None) Other Speech-Language Pathology Functional Limitation Current Status (L5726) (None) Other Speech-Language Pathology Functional Limitation Goal Status (O0355) (None) Other Speech-Language Pathology Functional Limitation Discharge Status [phone]) (None) Houston Siren 10/10/2016, 4:17 PM .Orbie Pyo Litaker M.Ed CCC-SLP Pager [phone]            CLINICAL DATA:  Coughing with meals. Prior stroke. Globus sensation. EXAM: MODIFIED BARIUM SWALLOW TECHNIQUE: Different consistencies of barium were administered orally to the patient by the Speech Pathologist. Imaging of the pharynx was performed in the lateral projection. FLUOROSCOPY TIME:  Fluoroscopy Time:  1 minutes, 40 seconds Number of Acquired Spot Images: 0 COMPARISON:  None. FINDINGS: Thin liquid- single episode of flash penetration. This did not occur in the chin-tuck position. Early spillover with several swallows. Pure with cracker- within normal limits Barium tablet -  within normal limits IMPRESSION: 1. Single episode of flash penetration with thin liquids. Early spillover with several swallows of thin liquids. Otherwise normal exam. Please refer to the Speech Pathologists report for complete details and recommendations. Electronically Signed   By: Van Clines M.D.   On: 10/10/2016 12:12   Mr Tibia Fibula Right W Wo Contrast  Result Date: 10/14/2016 CLINICAL DATA:  Pain, swelling and ecchymosis in the right calf. Large complex fluid collection noted on recent ultrasound. EXAM: MRI OF LOWER RIGHT EXTREMITY WITHOUT AND WITH CONTRAST TECHNIQUE: Multiplanar, multisequence MR imaging of the right lower extremity was performed both before and after administration of  intravenous contrast. CONTRAST:  80mL MULTIHANCE GADOBENATE DIMEGLUMINE 529 MG/ML IV SOLN COMPARISON:  Ultrasound 10/12/2016 FINDINGS: As demonstrated on the ultrasound examination is ache large complex fluid collection extending from the popliteal space down along the posterior and medial aspect of the calf all the way down to the ankle. This has mixed T1 and T2 signal intensity with areas of increased T1 signal intensity consistent with hemorrhage. No worrisome rim enhancement to suggest abscess. Findings are most consistent with a complex/ hemorrhagic leaking/ruptured Baker's cyst, particularly with history of anti coagulation. Fairly extensive areas of fatty change involving the calf musculature bilaterally most likely due to denervation injuries or prior diabetic muscle infarcts. Moderate right and small left knee joint effusions. No MR findings to suggest septic arthritis or osteomyelitis. IMPRESSION: Large, complex/hemorrhagic, leaking/ruptured Baker's cyst. No mass or evidence of abscess. Diffuse fatty change involving calf muscles bilaterally likely denervation process or remote diabetic muscle infarct. Bilateral knee joint effusions, right greater than left. No MR findings to suggest septic arthritis or osteomyelitis. Electronically Signed   By: Marijo Sanes M.D.   On: 10/14/2016 09:31   US Venous Img Lower Unilateral Right  Result Date: 10/12/2016 CLINICAL DATA:  Right leg pain  and swelling EXAM: RIGHT LOWER EXTREMITY VENOUS DOPPLER ULTRASOUND TECHNIQUE: Gray-scale sonography with graded compression, as well as color Doppler and duplex ultrasound were performed to evaluate the lower extremity deep venous systems from the level of the common femoral vein and including the common femoral, femoral, profunda femoral, popliteal and calf veins including the posterior tibial, peroneal and gastrocnemius veins when visible. The superficial great saphenous vein was also interrogated. Spectral Doppler was  utilized to evaluate flow at rest and with distal augmentation maneuvers in the common femoral, femoral and popliteal veins. COMPARISON:  None. FINDINGS: Contralateral Common Femoral Vein: Respiratory phasicity is normal and symmetric with the symptomatic side. No evidence of thrombus. Normal compressibility. Common Femoral Vein: No evidence of thrombus. Normal compressibility, respiratory phasicity and response to augmentation. Saphenofemoral Junction: No evidence of thrombus. Normal compressibility and flow on color Doppler imaging. Profunda Femoral Vein: No evidence of thrombus. Normal compressibility and flow on color Doppler imaging. Femoral Vein: Linear echogenic material is present within the femoral vein throughout the thigh. The vessel remains largely compressible and there is color flow on color Doppler imaging. Findings may represent sequelae of remote or recanalized DVT. Popliteal Vein: The popliteal vein is not compressible but rather expanded with low-level internal echoes. No significant flow on color Doppler imaging. Findings are consistent with acute thrombus. Calf Veins: No evidence of thrombus. Normal compressibility and flow on color Doppler imaging. Superficial Great Saphenous Vein: No evidence of thrombus. Normal compressibility and flow on color Doppler imaging. Venous Reflux:  None. Other Findings: Large complex fluid collection originating in the popliteal space and extending posteriorly along the entirety of the calf to the level of the ankle. IMPRESSION: 1. Positive for acute occlusive deep venous thrombosis in the popliteal vein. 2. Linear nonocclusive echogenic foci throughout the femoral vein in the thigh may represent the sequelae of a prior and now recanalized DVT. 3. Large complex fluid collection in the subcutaneous soft tissues beginning in the popliteal fossa and tracking inferiorly along the posterior calf to the level of the Achilles tendon. Differential considerations include  hematoma, perhaps due to partial gastrocnemius tear, partial or complete tear of the Achilles tendon with superior retraction of the muscle, and potentially a complex ruptured Baker's cyst. Electronically Signed   By: Jacqulynn Cadet M.D.   On: 10/12/2016 13:24    (Echo, Carotid, EGD, Colonoscopy, ERCP)    Subjective: Feels great no new complaints.  Discharge Exam: Vitals:   10/15/16 2122 10/16/16 0525  BP: 118/62 (!) 151/81  Pulse: 67 61  Resp: 18 18  Temp: 98.4 F (36.9 C) (!) 97.3 F (36.3 C)   Vitals:   10/15/16 0853 10/15/16 1403 10/15/16 2122 10/16/16 0525  BP: 132/68 117/69 118/62 (!) 151/81  Pulse: 76 72 67 61  Resp:  18 18 18   Temp:  98.5 F (36.9 C) 98.4 F (36.9 C) (!) 97.3 F (36.3 C)  TempSrc:  Oral Oral Axillary  SpO2:  98% 96% 99%  Weight:      Height:        General: She is awake alert and oriented in no acute distress in a good mood. Cardiovascular: Regular rate and rhythm with positive S1-S2 Respiratory: Good air movement is auscultation. Abdominal: Bowel sounds soft nontender nondistended Extremities: No edema or cyanosis.    The results of significant diagnostics from this hospitalization (including imaging, microbiology, ancillary and laboratory) are listed below for reference.     Microbiology: Recent Results (from the past 240 hour(s))  Blood  Culture (routine x 2)     Status: None (Preliminary result)   Collection Time: 10/12/16 11:25 AM  Result Value Ref Range Status   Specimen Description BLOOD RIGHT ANTECUBITAL  Final   Special Requests   Final    BOTTLES DRAWN AEROBIC AND ANAEROBIC Blood Culture adequate volume   Culture   Final    NO GROWTH 4 DAYS Performed at [facility], [place] [address], [place], [place] [address]    Report Status PENDING  Incomplete  Blood Culture (routine x 2)     Status: None (Preliminary result)   Collection Time: 10/12/16 11:45 AM  Result Value Ref Range Status   Specimen Description BLOOD LEFT  ANTECUBITAL  Final   Special Requests IN PEDIATRIC BOTTLE Blood Culture adequate volume  Final   Culture   Final    NO GROWTH 4 DAYS Performed at [facility], [place] [address], [place], [place] [address]    Report Status PENDING  Incomplete  Urine culture     Status: None   Collection Time: 10/12/16  2:05 PM  Result Value Ref Range Status   Specimen Description URINE, CATHETERIZED  Final   Special Requests NONE  Final   Culture   Final    NO GROWTH Performed at [facility], [address] [place], [place] [address]    Report Status 10/13/2016 FINAL  Final     Labs: BNP (last 3 results) No results for input(s): BNP in the last 8760 hours. Basic Metabolic Panel:  Recent Labs Lab 10/12/16 1125 10/13/16 0449 10/14/16 0423 10/15/16 0449 10/16/16 0457  NA 138 140 139 138 141  K 4.4 4.2 3.9 4.3 4.4  CL 104 109 112* 110 110  CO2 24 24 20* 23 22  GLUCOSE 115* 95 94 125* 132*  BUN 39* 29* 24* 20 25*  CREATININE 1.71* 1.32* 1.28* 1.24 1.18  CALCIUM 9.0 8.6* 8.1* 8.4* 8.9  MG  --   --   --   --  2.2   Liver Function Tests:  Recent Labs Lab 10/12/16 1125  AST 20  ALT 34  ALKPHOS 56  BILITOT 0.7  PROT 6.8  ALBUMIN 3.9   No results for input(s): LIPASE, AMYLASE in the last 168 hours. No results for input(s): AMMONIA in the last 168 hours. CBC:  Recent Labs Lab 10/12/16 1125 10/12/16 2053 10/13/16 0449 10/14/16 0423 10/15/16 0449 10/16/16 0457  WBC 12.4* 13.0* 12.2* 12.1* 16.5* 19.3*  NEUTROABS 7.3  --   --   --   --   --   HGB 12.3* 11.4* 10.7* 9.8* 9.2* 9.1*  HCT 35.9* 33.8* 32.3* 29.2* 28.0* 26.8*  MCV 89.3 87.3 86.8 87.7 87.0 88.4  PLT 130* 120* 104* 79* 84* 111*   Cardiac Enzymes: No results for input(s): CKTOTAL, CKMB, CKMBINDEX, TROPONINI in the last 168 hours. BNP: Invalid input(s): POCBNP CBG: No results for input(s): GLUCAP in the last 168 hours. D-Dimer No results for input(s): DDIMER in the last 72 hours. Hgb A1c No results for  input(s): HGBA1C in the last 72 hours. Lipid Profile No results for input(s): CHOL, HDL, LDLCALC, TRIG, CHOLHDL, LDLDIRECT in the last 72 hours. Thyroid function studies No results for input(s): TSH, T4TOTAL, T3FREE, THYROIDAB in the last 72 hours.  Invalid input(s): FREET3 Anemia work up No results for input(s): VITAMINB12, FOLATE, FERRITIN, TIBC, IRON, RETICCTPCT in the last 72 hours. Urinalysis    Component Value Date/Time   COLORURINE YELLOW 10/12/2016 1405  APPEARANCEUR CLOUDY (A) 10/12/2016 1405   LABSPEC 1.017 10/12/2016 1405   PHURINE 6.0 10/12/2016 1405   GLUCOSEU NEGATIVE 10/12/2016 1405   HGBUR LARGE (A) 10/12/2016 1405   BILIRUBINUR NEGATIVE 10/12/2016 1405   KETONESUR NEGATIVE 10/12/2016 1405   PROTEINUR NEGATIVE 10/12/2016 1405   UROBILINOGEN 1.0 04/04/2010 0110   NITRITE NEGATIVE 10/12/2016 1405   LEUKOCYTESUR NEGATIVE 10/12/2016 1405   Sepsis Labs Invalid input(s): PROCALCITONIN,  WBC,  LACTICIDVEN Microbiology Recent Results (from the past 240 hour(s))  Blood Culture (routine x 2)     Status: None (Preliminary result)   Collection Time: 10/12/16 11:25 AM  Result Value Ref Range Status   Specimen Description BLOOD RIGHT ANTECUBITAL  Final   Special Requests   Final    BOTTLES DRAWN AEROBIC AND ANAEROBIC Blood Culture adequate volume   Culture   Final    NO GROWTH 4 DAYS Performed at [facility], [place] [address] [place], [place] [address]    Report Status PENDING  Incomplete  Blood Culture (routine x 2)     Status: None (Preliminary result)   Collection Time: 10/12/16 11:45 AM  Result Value Ref Range Status   Specimen Description BLOOD LEFT ANTECUBITAL  Final   Special Requests IN PEDIATRIC BOTTLE Blood Culture adequate volume  Final   Culture   Final    NO GROWTH 4 DAYS Performed at [facility], [place] [address] [place], [place] [address]    Report Status PENDING  Incomplete  Urine culture     Status: None   Collection Time: 10/12/16   2:05 PM  Result Value Ref Range Status   Specimen Description URINE, CATHETERIZED  Final   Special Requests NONE  Final   Culture   Final    NO GROWTH Performed at [facility], [address] [place], [place] [address]    Report Status 10/13/2016 FINAL  Final     Time coordinating discharge: Over 30 minutes  SIGNED:   Charlynne Cousins, MD  [facility] 10/16/2016, 11:34 AM Pager   If 7PM-7AM, please contact night-coverage [email or web address] Password [email or web address]

## 2016-10-17 DIAGNOSIS — I69359 Hemiplegia and hemiparesis following cerebral infarction affecting unspecified side: Secondary | ICD-10-CM | POA: Diagnosis not present

## 2016-10-17 DIAGNOSIS — D649 Anemia, unspecified: Secondary | ICD-10-CM | POA: Diagnosis not present

## 2016-10-17 DIAGNOSIS — Z86718 Personal history of other venous thrombosis and embolism: Secondary | ICD-10-CM | POA: Diagnosis not present

## 2016-10-17 DIAGNOSIS — N179 Acute kidney failure, unspecified: Secondary | ICD-10-CM | POA: Diagnosis not present

## 2016-10-17 DIAGNOSIS — D693 Immune thrombocytopenic purpura: Secondary | ICD-10-CM | POA: Diagnosis not present

## 2016-10-17 LAB — CULTURE, BLOOD (ROUTINE X 2)
CULTURE: NO GROWTH
Culture: NO GROWTH
Special Requests: ADEQUATE
Special Requests: ADEQUATE

## 2016-10-21 ENCOUNTER — Inpatient Hospital Stay (HOSPITAL_BASED_OUTPATIENT_CLINIC_OR_DEPARTMENT_OTHER)
Admission: EM | Admit: 2016-10-21 | Discharge: 2016-10-26 | DRG: 682 | Disposition: A | Discharge status: Home Health | Payer: Medicare HMO | Attending: Internal Medicine | Admitting: Internal Medicine

## 2016-10-21 ENCOUNTER — Encounter (HOSPITAL_BASED_OUTPATIENT_CLINIC_OR_DEPARTMENT_OTHER): Payer: Self-pay | Admitting: Emergency Medicine

## 2016-10-21 ENCOUNTER — Emergency Department (HOSPITAL_BASED_OUTPATIENT_CLINIC_OR_DEPARTMENT_OTHER): Payer: Medicare HMO

## 2016-10-21 ENCOUNTER — Other Ambulatory Visit: Payer: Self-pay | Admitting: Family

## 2016-10-21 ENCOUNTER — Other Ambulatory Visit: Payer: Medicare HMO

## 2016-10-21 ENCOUNTER — Ambulatory Visit: Payer: Medicare Other | Admitting: Hematology & Oncology

## 2016-10-21 ENCOUNTER — Other Ambulatory Visit: Payer: Self-pay

## 2016-10-21 ENCOUNTER — Ambulatory Visit: Payer: Self-pay | Admitting: Hematology & Oncology

## 2016-10-21 ENCOUNTER — Inpatient Hospital Stay (HOSPITAL_COMMUNITY): Payer: Medicare HMO

## 2016-10-21 ENCOUNTER — Other Ambulatory Visit: Payer: Medicare Other

## 2016-10-21 DIAGNOSIS — K5900 Constipation, unspecified: Secondary | ICD-10-CM | POA: Diagnosis not present

## 2016-10-21 DIAGNOSIS — Z9861 Coronary angioplasty status: Secondary | ICD-10-CM | POA: Diagnosis not present

## 2016-10-21 DIAGNOSIS — G43A0 Cyclical vomiting, not intractable: Secondary | ICD-10-CM

## 2016-10-21 DIAGNOSIS — I959 Hypotension, unspecified: Secondary | ICD-10-CM | POA: Diagnosis not present

## 2016-10-21 DIAGNOSIS — R55 Syncope and collapse: Secondary | ICD-10-CM

## 2016-10-21 DIAGNOSIS — I69354 Hemiplegia and hemiparesis following cerebral infarction affecting left non-dominant side: Secondary | ICD-10-CM

## 2016-10-21 DIAGNOSIS — I999 Unspecified disorder of circulatory system: Secondary | ICD-10-CM | POA: Diagnosis not present

## 2016-10-21 DIAGNOSIS — I10 Essential (primary) hypertension: Secondary | ICD-10-CM | POA: Diagnosis present

## 2016-10-21 DIAGNOSIS — Z7982 Long term (current) use of aspirin: Secondary | ICD-10-CM

## 2016-10-21 DIAGNOSIS — R1110 Vomiting, unspecified: Secondary | ICD-10-CM | POA: Diagnosis not present

## 2016-10-21 DIAGNOSIS — Z79899 Other long term (current) drug therapy: Secondary | ICD-10-CM | POA: Diagnosis not present

## 2016-10-21 DIAGNOSIS — S8990XA Unspecified injury of unspecified lower leg, initial encounter: Secondary | ICD-10-CM | POA: Diagnosis not present

## 2016-10-21 DIAGNOSIS — Z9081 Acquired absence of spleen: Secondary | ICD-10-CM | POA: Diagnosis not present

## 2016-10-21 DIAGNOSIS — Z87891 Personal history of nicotine dependence: Secondary | ICD-10-CM | POA: Diagnosis not present

## 2016-10-21 DIAGNOSIS — Z7983 Long term (current) use of bisphosphonates: Secondary | ICD-10-CM

## 2016-10-21 DIAGNOSIS — D6862 Lupus anticoagulant syndrome: Secondary | ICD-10-CM | POA: Diagnosis not present

## 2016-10-21 DIAGNOSIS — K2970 Gastritis, unspecified, without bleeding: Secondary | ICD-10-CM | POA: Diagnosis present

## 2016-10-21 DIAGNOSIS — D693 Immune thrombocytopenic purpura: Secondary | ICD-10-CM | POA: Diagnosis not present

## 2016-10-21 DIAGNOSIS — R112 Nausea with vomiting, unspecified: Secondary | ICD-10-CM | POA: Diagnosis not present

## 2016-10-21 DIAGNOSIS — I743 Embolism and thrombosis of arteries of the lower extremities: Secondary | ICD-10-CM | POA: Diagnosis not present

## 2016-10-21 DIAGNOSIS — G4730 Sleep apnea, unspecified: Secondary | ICD-10-CM | POA: Diagnosis present

## 2016-10-21 DIAGNOSIS — E785 Hyperlipidemia, unspecified: Secondary | ICD-10-CM | POA: Diagnosis present

## 2016-10-21 DIAGNOSIS — R6510 Systemic inflammatory response syndrome (SIRS) of non-infectious origin without acute organ dysfunction: Secondary | ICD-10-CM | POA: Diagnosis not present

## 2016-10-21 DIAGNOSIS — I951 Orthostatic hypotension: Secondary | ICD-10-CM | POA: Diagnosis present

## 2016-10-21 DIAGNOSIS — G8110 Spastic hemiplegia affecting unspecified side: Secondary | ICD-10-CM | POA: Diagnosis present

## 2016-10-21 DIAGNOSIS — N400 Enlarged prostate without lower urinary tract symptoms: Secondary | ICD-10-CM | POA: Diagnosis present

## 2016-10-21 DIAGNOSIS — N179 Acute kidney failure, unspecified: Principal | ICD-10-CM | POA: Diagnosis present

## 2016-10-21 DIAGNOSIS — I744 Embolism and thrombosis of arteries of extremities, unspecified: Secondary | ICD-10-CM | POA: Diagnosis not present

## 2016-10-21 DIAGNOSIS — I2510 Atherosclerotic heart disease of native coronary artery without angina pectoris: Secondary | ICD-10-CM | POA: Diagnosis present

## 2016-10-21 DIAGNOSIS — G40909 Epilepsy, unspecified, not intractable, without status epilepticus: Secondary | ICD-10-CM | POA: Diagnosis present

## 2016-10-21 DIAGNOSIS — R911 Solitary pulmonary nodule: Secondary | ICD-10-CM | POA: Diagnosis present

## 2016-10-21 DIAGNOSIS — R0602 Shortness of breath: Secondary | ICD-10-CM | POA: Diagnosis not present

## 2016-10-21 DIAGNOSIS — D72829 Elevated white blood cell count, unspecified: Secondary | ICD-10-CM | POA: Diagnosis not present

## 2016-10-21 DIAGNOSIS — M7121 Synovial cyst of popliteal space [Baker], right knee: Secondary | ICD-10-CM | POA: Diagnosis present

## 2016-10-21 DIAGNOSIS — J9601 Acute respiratory failure with hypoxia: Secondary | ICD-10-CM | POA: Diagnosis not present

## 2016-10-21 DIAGNOSIS — G4733 Obstructive sleep apnea (adult) (pediatric): Secondary | ICD-10-CM | POA: Diagnosis present

## 2016-10-21 DIAGNOSIS — Z86718 Personal history of other venous thrombosis and embolism: Secondary | ICD-10-CM | POA: Diagnosis not present

## 2016-10-21 DIAGNOSIS — A419 Sepsis, unspecified organism: Secondary | ICD-10-CM | POA: Diagnosis present

## 2016-10-21 DIAGNOSIS — Z7951 Long term (current) use of inhaled steroids: Secondary | ICD-10-CM

## 2016-10-21 DIAGNOSIS — D649 Anemia, unspecified: Secondary | ICD-10-CM | POA: Diagnosis not present

## 2016-10-21 DIAGNOSIS — E860 Dehydration: Secondary | ICD-10-CM | POA: Diagnosis present

## 2016-10-21 DIAGNOSIS — J449 Chronic obstructive pulmonary disease, unspecified: Secondary | ICD-10-CM | POA: Diagnosis present

## 2016-10-21 DIAGNOSIS — R296 Repeated falls: Secondary | ICD-10-CM | POA: Diagnosis present

## 2016-10-21 DIAGNOSIS — E872 Acidosis: Secondary | ICD-10-CM | POA: Diagnosis not present

## 2016-10-21 DIAGNOSIS — Z7901 Long term (current) use of anticoagulants: Secondary | ICD-10-CM | POA: Diagnosis not present

## 2016-10-21 DIAGNOSIS — Z8673 Personal history of transient ischemic attack (TIA), and cerebral infarction without residual deficits: Secondary | ICD-10-CM | POA: Diagnosis not present

## 2016-10-21 DIAGNOSIS — I69359 Hemiplegia and hemiparesis following cerebral infarction affecting unspecified side: Secondary | ICD-10-CM

## 2016-10-21 DIAGNOSIS — I63411 Cerebral infarction due to embolism of right middle cerebral artery: Secondary | ICD-10-CM | POA: Diagnosis not present

## 2016-10-21 DIAGNOSIS — F909 Attention-deficit hyperactivity disorder, unspecified type: Secondary | ICD-10-CM | POA: Diagnosis present

## 2016-10-21 LAB — CBC WITH DIFFERENTIAL/PLATELET
BASOS ABS: 0 10*3/uL (ref 0.0–0.1)
BASOS PCT: 0 %
EOS ABS: 1.1 10*3/uL — AB (ref 0.0–0.7)
EOS PCT: 6 %
HCT: 40.8 % (ref 39.0–52.0)
HEMOGLOBIN: 13.7 g/dL (ref 13.0–17.0)
LYMPHS PCT: 28 %
Lymphs Abs: 5.2 10*3/uL — ABNORMAL HIGH (ref 0.7–4.0)
MCH: 30.9 pg (ref 26.0–34.0)
MCHC: 33.6 g/dL (ref 30.0–36.0)
MCV: 92.1 fL (ref 78.0–100.0)
MONO ABS: 2.9 10*3/uL — AB (ref 0.1–1.0)
Monocytes Relative: 15 %
Neutro Abs: 9.6 10*3/uL — ABNORMAL HIGH (ref 1.7–7.7)
Neutrophils Relative %: 51 %
PLATELETS: 348 10*3/uL (ref 150–400)
RBC: 4.43 MIL/uL (ref 4.22–5.81)
RDW: 17.4 % — AB (ref 11.5–15.5)
WBC: 18.7 10*3/uL — AB (ref 4.0–10.5)

## 2016-10-21 LAB — URINALYSIS, ROUTINE W REFLEX MICROSCOPIC
BILIRUBIN URINE: NEGATIVE
Glucose, UA: NEGATIVE mg/dL
KETONES UR: NEGATIVE mg/dL
LEUKOCYTES UA: NEGATIVE
NITRITE: NEGATIVE
Protein, ur: NEGATIVE mg/dL
Specific Gravity, Urine: 1.036 — ABNORMAL HIGH (ref 1.005–1.030)
pH: 5.5 (ref 5.0–8.0)

## 2016-10-21 LAB — COMPREHENSIVE METABOLIC PANEL
ALK PHOS: 61 U/L (ref 38–126)
ALT: 97 U/L — AB (ref 17–63)
ANION GAP: 12 (ref 5–15)
AST: 36 U/L (ref 15–41)
Albumin: 4.1 g/dL (ref 3.5–5.0)
BUN: 54 mg/dL — ABNORMAL HIGH (ref 6–20)
CALCIUM: 9.2 mg/dL (ref 8.9–10.3)
CO2: 22 mmol/L (ref 22–32)
CREATININE: 1.75 mg/dL — AB (ref 0.61–1.24)
Chloride: 101 mmol/L (ref 101–111)
GFR, EST AFRICAN AMERICAN: 43 mL/min — AB (ref >60)
GFR, EST NON AFRICAN AMERICAN: 37 mL/min — AB (ref >60)
Glucose, Bld: 182 mg/dL — ABNORMAL HIGH (ref 65–99)
Potassium: 4 mmol/L (ref 3.5–5.1)
SODIUM: 135 mmol/L (ref 135–145)
Total Bilirubin: 1 mg/dL (ref 0.3–1.2)
Total Protein: 7.3 g/dL (ref 6.5–8.1)

## 2016-10-21 LAB — MRSA PCR SCREENING: MRSA by PCR: NEGATIVE

## 2016-10-21 LAB — I-STAT CG4 LACTIC ACID, ED
LACTIC ACID, VENOUS: 2.15 mmol/L — AB (ref 0.5–1.9)
LACTIC ACID, VENOUS: 0.41 mmol/L — AB (ref 0.5–1.9)

## 2016-10-21 LAB — URINALYSIS, MICROSCOPIC (REFLEX)

## 2016-10-21 LAB — CBG MONITORING, ED: GLUCOSE-CAPILLARY: 158 mg/dL — AB (ref 65–99)

## 2016-10-21 LAB — PROTIME-INR
INR: 1.84
PROTHROMBIN TIME: 21.5 s — AB (ref 11.4–15.2)

## 2016-10-21 LAB — TROPONIN I

## 2016-10-21 MED ORDER — ACETAMINOPHEN 325 MG PO TABS
650.0000 mg | ORAL_TABLET | Freq: Four times a day (QID) | ORAL | Status: DC | PRN
  Administered 2016-10-21 – 2016-10-22 (×2): 650 mg via ORAL
  Filled 2016-10-21 (×2): qty 2

## 2016-10-21 MED ORDER — PIPERACILLIN-TAZOBACTAM 3.375 G IVPB
3.3750 g | Freq: Three times a day (TID) | INTRAVENOUS | Status: DC
  Administered 2016-10-21 – 2016-10-22 (×3): 3.375 g via INTRAVENOUS
  Filled 2016-10-21 (×3): qty 50

## 2016-10-21 MED ORDER — SODIUM CHLORIDE 0.9 % IV BOLUS (SEPSIS)
1000.0000 mL | Freq: Once | INTRAVENOUS | Status: AC
  Administered 2016-10-21: 1000 mL via INTRAVENOUS

## 2016-10-21 MED ORDER — ROSUVASTATIN CALCIUM 20 MG PO TABS
20.0000 mg | ORAL_TABLET | Freq: Every day | ORAL | Status: DC
  Administered 2016-10-21: 20 mg via ORAL
  Filled 2016-10-21: qty 1

## 2016-10-21 MED ORDER — SODIUM CHLORIDE 0.9 % IV SOLN
INTRAVENOUS | Status: DC
  Administered 2016-10-21 – 2016-10-24 (×4): via INTRAVENOUS

## 2016-10-21 MED ORDER — FENTANYL CITRATE (PF) 100 MCG/2ML IJ SOLN
25.0000 ug | Freq: Once | INTRAMUSCULAR | Status: AC
  Administered 2016-10-21: 25 ug via INTRAVENOUS
  Filled 2016-10-21: qty 2

## 2016-10-21 MED ORDER — FLUTICASONE PROPIONATE 50 MCG/ACT NA SUSP
2.0000 | Freq: Every day | NASAL | Status: DC | PRN
Start: 2016-10-21 — End: 2016-10-26
  Filled 2016-10-21: qty 16

## 2016-10-21 MED ORDER — ORAL CARE MOUTH RINSE
15.0000 mL | Freq: Two times a day (BID) | OROMUCOSAL | Status: DC
  Administered 2016-10-22 – 2016-10-26 (×9): 15 mL via OROMUCOSAL

## 2016-10-21 MED ORDER — TRAMADOL HCL 50 MG PO TABS
50.0000 mg | ORAL_TABLET | Freq: Four times a day (QID) | ORAL | Status: DC | PRN
  Administered 2016-10-21 – 2016-10-22 (×2): 50 mg via ORAL
  Filled 2016-10-21 (×2): qty 1

## 2016-10-21 MED ORDER — BUPROPION HCL ER (XL) 150 MG PO TB24
150.0000 mg | ORAL_TABLET | Freq: Every day | ORAL | Status: DC
  Administered 2016-10-22 – 2016-10-26 (×5): 150 mg via ORAL
  Filled 2016-10-21 (×5): qty 1

## 2016-10-21 MED ORDER — FAMOTIDINE 20 MG PO TABS
40.0000 mg | ORAL_TABLET | Freq: Two times a day (BID) | ORAL | Status: DC
  Administered 2016-10-21 – 2016-10-23 (×4): 40 mg via ORAL
  Filled 2016-10-21 (×4): qty 2

## 2016-10-21 MED ORDER — FENTANYL CITRATE (PF) 100 MCG/2ML IJ SOLN
50.0000 ug | Freq: Once | INTRAMUSCULAR | Status: AC
  Administered 2016-10-21: 50 ug via INTRAVENOUS
  Filled 2016-10-21: qty 2

## 2016-10-21 MED ORDER — SERTRALINE HCL 100 MG PO TABS
200.0000 mg | ORAL_TABLET | Freq: Every day | ORAL | Status: DC
  Administered 2016-10-22 – 2016-10-26 (×5): 200 mg via ORAL
  Filled 2016-10-21 (×5): qty 2

## 2016-10-21 MED ORDER — AMLODIPINE BESYLATE 5 MG PO TABS
2.5000 mg | ORAL_TABLET | Freq: Every day | ORAL | Status: DC
  Administered 2016-10-21: 2.5 mg via ORAL
  Filled 2016-10-21: qty 1

## 2016-10-21 MED ORDER — IOPAMIDOL (ISOVUE-370) INJECTION 76%
100.0000 mL | Freq: Once | INTRAVENOUS | Status: AC | PRN
  Administered 2016-10-21: 100 mL via INTRAVENOUS

## 2016-10-21 MED ORDER — MORPHINE SULFATE (PF) 2 MG/ML IV SOLN: 2.0000 mg | INTRAVENOUS | Status: DC | PRN

## 2016-10-21 MED ORDER — PIPERACILLIN-TAZOBACTAM 3.375 G IVPB 30 MIN
3.3750 g | Freq: Once | INTRAVENOUS | Status: AC
  Administered 2016-10-21: 3.375 g via INTRAVENOUS
  Filled 2016-10-21 (×2): qty 50

## 2016-10-21 MED ORDER — RIVAROXABAN 20 MG PO TABS
20.0000 mg | ORAL_TABLET | Freq: Every day | ORAL | Status: DC
  Administered 2016-10-22 – 2016-10-26 (×5): 20 mg via ORAL
  Filled 2016-10-21 (×5): qty 1

## 2016-10-21 MED ORDER — VANCOMYCIN HCL IN DEXTROSE 750-5 MG/150ML-% IV SOLN
750.0000 mg | Freq: Two times a day (BID) | INTRAVENOUS | Status: DC
  Administered 2016-10-22: 750 mg via INTRAVENOUS
  Filled 2016-10-21 (×3): qty 150

## 2016-10-21 MED ORDER — LEVETIRACETAM 500 MG PO TABS
1500.0000 mg | ORAL_TABLET | Freq: Two times a day (BID) | ORAL | Status: DC
  Administered 2016-10-21 – 2016-10-26 (×10): 1500 mg via ORAL
  Filled 2016-10-21 (×11): qty 3

## 2016-10-21 MED ORDER — VANCOMYCIN HCL IN DEXTROSE 1-5 GM/200ML-% IV SOLN
1000.0000 mg | Freq: Once | INTRAVENOUS | Status: AC
  Administered 2016-10-21: 1000 mg via INTRAVENOUS
  Filled 2016-10-21: qty 200

## 2016-10-21 NOTE — ED Triage Notes (Signed)
Pt comes from Dr. Dicie Beam office. Pt was there for a routine appt and per family become diaphoretic, vomited, and passed out. Pt is pale, diaphoretic and O2 sats are in the 80s on room air. Pt does not wear O2 at home.

## 2016-10-21 NOTE — Progress Notes (Signed)
Orthostatic blood pressures taken. Lying 119/57. Sitting 114/53. Standing at 0 minutes 84/48. Pt complained of lightheadedness and dizziness, so patient returned to bed. Blood pressure retaken once in bed 134/58. Patient no longer has lightheadedness and dizziness and is resting comfortably in bed. Nurse will continue to monitor.

## 2016-10-21 NOTE — ED Notes (Addendum)
[facility] at bedside 

## 2016-10-21 NOTE — ED Provider Notes (Signed)
[facility] Provider Note   CSN: [id number] Arrival date & time: 10/21/16  1209     History   Chief Complaint Chief Complaint  Patient presents with  . Loss of Consciousness    HPI Mark [name] is a 71 y.o. male hx ITP, DVT on xarelto, lupus anticoagulant, seizure, here with syncope, hypoxia. Patient Was recently admitted to the hospital and apparently had a DVT from a complex Baker's cyst despite being on Coumadin. Initially there was a discussion about IVC filter but oncology decided to switch patient to xarelto. Patient was discharged from the hospital 5 days ago. Patient states that he felt well until he came to follow with Dr. Marin Olp today. He was at the office and was in a chair and felt light headed and dizzy and passed out. No head injury at that time. He was noted to be hypoxic to 80% on RA. Patient not on oxygen at baseline. He was noted to be hypothermic and hypoxic 89% on 2L in triage and hypotensive 80s.     The history is provided by the patient.    Past Medical History:  Diagnosis Date  . Chronic ITP (idiopathic thrombocytopenia) (HCC)   . Coronary artery disease 1992   MI  . DVT (deep venous thrombosis) ([place])   . 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 Hypertension   . Lupus anticoagulant disorder ([place])   . Multiple closed anterior-posterior compression fractures of pelvis ([place])   . Seizures ([place])   . Stroke [facility]) 02/27/2011   Left side weakness    Patient Active Problem List   Diagnosis Date Noted  . DVT (deep venous thrombosis) ([place]) 10/12/2016  . Supratherapeutic INR 10/12/2016  . Acute deep vein thrombosis (DVT) of popliteal vein of right lower extremity ([place]) 10/12/2016  . AKI (acute kidney injury) ([place]) 10/12/2016  . Fever 10/12/2016  . Leukocytosis 10/12/2016  . Bacteriuria 10/12/2016  . Deep vein thrombosis (DVT) ([place]) 03/02/2015  . Lupus anticoagulant disorder ([place]) 03/02/2015  . Ischemic stroke ([place]) 10/04/2014  . Carotid artery obstruction  10/04/2014  . Deep vein thrombosis ([place]) 10/04/2014  . 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 LA (lupus anticoagulant) disorder ([place]) 10/04/2014  . Arteriosclerosis of coronary artery 09/10/2012  . Apnea, sleep 09/10/2012  . 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 Dermatophytic onychia 01/29/2012  . Spastic hemiplegia ([place]) 12/03/2011  . Hemiparesis, left ([place]) 10/31/2011  . Dysphonia 08/13/2011  . Idiopathic thrombocytopenic purpura ([place]) 08/12/2011  . ITP (idiopathic thrombocytopenic purpura) 08/12/2011    Past Surgical History:  Procedure Laterality Date  . blood clot Right    surgical removal  . CHOLECYSTECTOMY    . CORONARY ANGIOPLASTY  1992  . SPLENECTOMY, TOTAL    . vertebralplasty         Home Medications    Prior to Admission medications   Medication Sig Start Date End Date Taking? Authorizing Provider  acetaminophen (TYLENOL) 500 MG tablet Take 1,000 mg by mouth every 8 (eight) hours as needed (pain).     [provider]  alendronate (FOSAMAX) 70 MG tablet Take 70 mg by mouth every Sunday. Take with a full glass of water on an empty stomach.     [provider]  amLODipine (NORVASC) 2.5 MG tablet Take 2.5 mg by mouth at bedtime.  02/21/15   [provider]  aspirin 81 MG chewable tablet Chew 1 tablet (81 mg total) by mouth at bedtime. 10/23/16   Charlynne Cousins, MD  bethanechol (  URECHOLINE) 25 MG tablet Take 12.5 mg by mouth 2 (two) times daily.  07/27/14   [provider]  buPROPion (WELLBUTRIN XL) 150 MG 24 hr tablet Take 150 mg by mouth daily.  12/19/14   [provider]  Cholecalciferol (VITAMIN D3) 1000 units CAPS Take 2,000 Units by mouth 2 (two) times daily after a meal.     [provider]  CRESTOR 20 MG tablet Take 20 mg by mouth at bedtime.  11/16/14   [provider]  dexamethasone (DECADRON) 4 MG tablet TAKE 10 TABLET (40MG ) BY MOUTH DAILY FOR 2 DAYS THEN STOP 10/16/16   Charlynne Cousins, MD  diphenhydrAMINE (BENADRYL) 25 MG tablet Take 25-50 mg by mouth every 6 (six) hours as needed for allergies.     [provider]  famotidine (PEPCID) 40 MG tablet Take 40 mg by mouth 2 (two) times daily.  04/02/11   [provider]  fluticasone (FLONASE) 50 MCG/ACT nasal spray Place 2 sprays into both nostrils daily as needed for rhinitis.  06/23/14   [provider]  levETIRAcetam (KEPPRA) 500 MG tablet Take 1,500 mg by mouth 2 (two) times daily. 3 tabs  in am and 3 tabs in pm 12/24/11   [provider]  Multiple Vitamins-Minerals (MULTIVITAMIN WITH MINERALS) tablet Take 1 tablet by mouth 2 (two) times daily.     [provider]  NYSTATIN, TOPICAL, POWD Apply 1 application topically 2 (two) times daily. Patient taking differently: Apply 1 application topically 2 (two) times daily as needed (rash).  10/04/14   [place], Holli Humbles, NP  rivaroxaban (XARELTO) 20 MG TABS tablet Take 1 tablet (20 mg total) by mouth daily with breakfast. 10/17/16   Charlynne Cousins, MD  sertraline (ZOLOFT) 100 MG tablet Take 200 mg by mouth daily.     [provider]  Tamsulosin HCl (FLOMAX) 0.4 MG CAPS Take 0.8 mg by mouth at bedtime.     [provider]  traMADol (ULTRAM) 50 MG tablet Take 50 mg by mouth every 6 (six) hours as needed (pain).    [provider]    Family History No family history on file.  Social History Social History  Substance Use Topics  . Smoking status: Former Smoker    Packs/day: 1.00    Years: 15.00    Types: Cigarettes    Start date: 06/12/1968    Quit date: 01/27/1985  . Smokeless tobacco: Never Used     Comment: quit smoking 28 yeras ago  . Alcohol use No     Allergies   Patient has no known allergies.   Review of Systems Review of Systems  Cardiovascular: Positive for syncope.  Neurological: Positive for dizziness and syncope.  All other systems reviewed and are negative.    Physical  Exam Updated Vital Signs BP (!) 92/40   Pulse 64   Temp 99.3 F (37.4 C) (Rectal)   Resp (!) 24   Ht 6' (1.829 m)   Wt 93 kg (205 lb)   SpO2 96%   BMI 27.80 kg/m   Physical Exam  Constitutional: He is oriented to person, place, and time.  Ill appearing, dehydrated   HENT:  Head: Normocephalic.  Eyes: EOM are normal. Pupils are equal, round, and reactive to light.  Neck: Normal range of motion. Neck supple.  Cardiovascular: Normal rate, regular rhythm and normal heart sounds.   Pulmonary/Chest:  Diminished bilaterally   Abdominal: Soft. Bowel sounds are normal. He exhibits no distension.  There is no tenderness.  Musculoskeletal:  Swelling behind R knee, ecchymosis R calf. Splint L leg   Neurological: He is alert and oriented to person, place, and time.  Skin: Skin is warm.  Psychiatric: He has a normal mood and affect.  Nursing note and vitals reviewed.    ED Treatments / Results  Labs (all labs ordered are listed, but only abnormal results are displayed) Labs Reviewed  COMPREHENSIVE METABOLIC PANEL - Abnormal; Notable for the following:       Result Value   Glucose, Bld 182 (*)    BUN 54 (*)    Creatinine, Ser 1.75 (*)    ALT 97 (*)    GFR calc non Af Amer 37 (*)    GFR calc Af Amer 43 (*)    All other components within normal limits  CBC WITH DIFFERENTIAL/PLATELET - Abnormal; Notable for the following:    WBC 18.7 (*)    RDW 17.4 (*)    Neutro Abs 9.6 (*)    Lymphs Abs 5.2 (*)    Monocytes Absolute 2.9 (*)    Eosinophils Absolute 1.1 (*)    All other components within normal limits  PROTIME-INR - Abnormal; Notable for the following:    Prothrombin Time 21.5 (*)    All other components within normal limits  I-STAT CG4 LACTIC ACID, ED - Abnormal; Notable for the following:    Lactic Acid, Venous 2.15 (*)    All other components within normal limits  CBG MONITORING, ED - Abnormal; Notable for the following:    Glucose-Capillary 158 (*)    All other  components within normal limits  CULTURE, BLOOD (ROUTINE X 2)  CULTURE, BLOOD (ROUTINE X 2)  URINE CULTURE  TROPONIN I  URINALYSIS, ROUTINE W REFLEX MICROSCOPIC  PATHOLOGIST SMEAR REVIEW    EKG  EKG Interpretation  Date/Time:  Monday October 21 2016 12:33:03 EDT Ventricular Rate:  69 PR Interval:    QRS Duration: 94 QT Interval:  407 QTC Calculation: 436 R Axis:   48 Text Interpretation:  Age not entered, assumed to be  71 years old for purpose of ECG interpretation Sinus rhythm ST elev, probable normal early repol pattern No significant change since last tracing Confirmed by Wandra Arthurs [phone]) on 10/21/2016 1:32:57 PM       Radiology Ct Angio Chest Pe W And/or Wo Contrast  Result Date: 10/21/2016 CLINICAL DATA:  Shortness of breath, recent DVT. EXAM: CT ANGIOGRAPHY CHEST WITH CONTRAST TECHNIQUE: Multidetector CT imaging of the chest was performed using the standard protocol during bolus administration of intravenous contrast. Multiplanar CT image reconstructions and MIPs were obtained to evaluate the vascular anatomy. CONTRAST:  100 cc Isovue 370 COMPARISON:  None. FINDINGS: Cardiovascular: The heart is normal in size. No pericardial effusion. The aorta is normal in caliber. No significant atherosclerotic calcifications. Coronary artery calcifications are noted. The pulmonary arterial tree is fairly well opacified. No definite filling defects to suggest pulmonary embolism. Mediastinum/Nodes: Small scattered mediastinal and hilar lymph nodes but no mass or adenopathy. The esophagus is grossly normal. Lungs/Pleura: No acute pulmonary findings. No infiltrates, edema or effusions. 6.5 mm right middle lobe pulmonary nodule along the minor fissure is most likely a lymph node. No other pulmonary nodules are identified. Basilar scarring changes are noted. No bronchiectasis or interstitial lung disease. Upper Abdomen: No significant upper abdominal findings. Surgical changes from a splenectomy are  noted. The gallbladder surgically absent. The upper abdominal aorta is normal in caliber. Chest wall/ Musculoskeletal:  No chest wall mass, supraclavicular or axillary lymphadenopathy. The thyroid gland appears normal. Remote healed rib fractures are noted. There are also vertebral augmentation changes noted at T11 and T12. Review of the MIP images confirms the above findings. IMPRESSION: 1. No CT findings for pulmonary embolism. 2. No aortic aneurysm or dissection. 3. No acute pulmonary findings. 4. 6.5 mm right middle lobe pulmonary nodule along the minor fissure is likely a lymph node. Non-contrast chest CT at 6-12 months is recommended. If the nodule is stable at time of repeat CT, then future CT at 18-24 months (from today's scan) is considered optional for low-risk patients, but is recommended for high-risk patients. This recommendation follows the consensus statement: Guidelines for Management of Incidental Pulmonary Nodules Detected on CT Images: From the Fleischner Society 2017; Radiology 2017; 284:228-243. Aortic Atherosclerosis (ICD10-I70.0). Electronically Signed   By: Marijo Sanes M.D.   On: 10/21/2016 13:40    Procedures Procedures (including critical care time)  CRITICAL CARE Performed by: Wandra Arthurs   Total critical care time: 30 minutes  Critical care time was exclusive of separately billable procedures and treating other patients.  Critical care was necessary to treat or prevent imminent or life-threatening deterioration.  Critical care was time spent personally by me on the following activities: development of treatment plan with patient and/or surrogate as well as nursing, discussions with consultants, evaluation of patient's response to treatment, examination of patient, obtaining history from patient or surrogate, ordering and performing treatments and interventions, ordering and review of laboratory studies, ordering and review of radiographic studies, pulse oximetry and  re-evaluation of patient's condition.   Medications Ordered in ED Medications  vancomycin (VANCOCIN) IVPB 1000 mg/200 mL premix (not administered)  vancomycin (VANCOCIN) IVPB 750 mg/150 ml premix (not administered)  piperacillin-tazobactam (ZOSYN) IVPB 3.375 g (not administered)  sodium chloride 0.9 % bolus 1,000 mL (not administered)  sodium chloride 0.9 % bolus 1,000 mL (0 mLs Intravenous Stopped 10/21/16 1422)    And  sodium chloride 0.9 % bolus 1,000 mL (0 mLs Intravenous Stopped 10/21/16 1321)    And  sodium chloride 0.9 % bolus 1,000 mL (0 mLs Intravenous Stopped 10/21/16 1323)  piperacillin-tazobactam (ZOSYN) IVPB 3.375 g (0 g Intravenous Stopped 10/21/16 1339)  vancomycin (VANCOCIN) IVPB 1000 mg/200 mL premix (0 mg Intravenous Stopped 10/21/16 1422)  iopamidol (ISOVUE-370) 76 % injection 100 mL (100 mLs Intravenous Contrast Given 10/21/16 1317)  fentaNYL (SUBLIMAZE) injection 25 mcg (25 mcg Intravenous Given 10/21/16 1348)     Initial Impression / Assessment and Plan / ED Course  I have reviewed the triage vital signs and the nursing notes.  Pertinent labs & imaging results that were available during my care of the patient were reviewed by me and considered in my medical decision making (see chart for details).     Mark [name] is a 71 y.o. male here with syncope. Was hypothermic, tachycardic, hypotensive in triage. Concerned for septic shock vs PE with failure of oral anticoagulants (recent DVT despite being on coumadin). Code sepsis initiated. Given 30 cc/kg bolus. Will also order empiric vanc/zosyn. Will get CT angio chest to r/o PE.   2:38 PM  CT angio chest showed no PE. Given 30 cc/kg bolus but BP still upper 90s to low 100s. Patient mentating well. Has acute renal failure as well. WBC 18, lactate 2.1. UA showed no UTI. Given vanc/zosyn empirically. Will admit to stepdown for acute renal failure, possible severe sepsis, syncope, hypoxia.    Final Clinical Impressions(s) /  ED  Diagnoses   Final diagnoses:  None    New Prescriptions New Prescriptions   No medications on file     Drenda Freeze, MD 10/21/16 1526

## 2016-10-21 NOTE — Progress Notes (Signed)
Pharmacy Antibiotic Note  Mark [name] is a 71 y.o. male admitted on 10/21/2016 with sepsis.  Pharmacy has been consulted for vancomycin/zosyn dosing. Afebrile, , LA 2.15. SCr 1.75 on admit, CrCl~42.  Plan: Zosyn 3.375g IV (12min infusion) x1; then 3.375g IV q8h (4h infusion) Vancomycin 2g IV x1; then 750mg  IV q12h Monitor clinical progress, c/s, renal function F/u de-escalation plan/LOT, vancomycin trough as indicated   Height: 6' (182.9 cm) Weight: 205 lb (93 kg) IBW/kg (Calculated) : 77.6  Temp (24hrs), Avg:97.8 F (36.6 C), Min:96.2 F (35.7 C), Max:99.3 F (37.4 C)   Recent Labs Lab 10/15/16 0449 10/16/16 0457 10/21/16 1228 10/21/16 1232  WBC 16.5* 19.3*  --   --   CREATININE 1.24 1.18 1.75*  --   LATICACIDVEN  --   --   --  2.15*    Estimated Creatinine Clearance: 42.5 mL/min (A) (by C-G formula based on SCr of 1.75 mg/dL (H)).    No Known Allergies  Antimicrobials this admission: 7/9 vancomycin >>  7/9 zosyn >>   Dose adjustments this admission:   Microbiology results:   Elicia Lamp, PharmD, BCPS Clinical Pharmacist 10/21/2016 1:25 PM

## 2016-10-21 NOTE — H&P (Signed)
History and Physical    Mark [name] IRW:[id number] DOB: 09/22/1945 DOA: 10/21/2016  PCP: Mark Stains, MD  Patient coming from: Home  Chief Complaint: Syncope  HPI: Mark [name] is a 71 y.o. male with medical history significant of recent acute DVT on therapeutic xarelto, hx of ARF, CAD, chronic ITP, seizure disorder, HTN who presents as a direct admission from [facility] for concerns for possible sepsis with associated syncope. Patient was recently discharged on 7/4 where pt was found to have super therapeutic INR with recommendation to transition to [place] per hematology recs. Patient was also found to have a large, leaking/ruptured baker's cyst which otherwise remained stable. Patient presented to Hematology clinic for f/u where he was noted to have episode of n/v and subsequent syncope. Patient was referred to ED at [facility] for work up.  Patient further describes syncope as occurring while sitting in wheelchair. Patient felt nauseated, vomited, then eyes rolled backwards. Pt recalled feeling exceedingly "weak" prior to onset of symptoms. No post-ictal symptoms. No fevers.  ED Course: In the ED, pt was noted to be hypoxic on RA and hypotensive with sbp in the 80's. Patient was noted to have elevated lactate of over 2. CTA chest was neg for PE. UA was unremarkable. Patient was given IVF hydration with repeat lactate being normal. Pt was started on empiric vanc/zosyn. BP improved following initiation of IVF. Patient was directly admitted to [facility] for further work up.  Review of Systems:  Review of Systems  Constitutional: Negative for chills, malaise/fatigue and weight loss.  HENT: Negative for ear discharge, ear pain and tinnitus.   Eyes: Negative for double vision, photophobia, discharge and redness.  Respiratory: Negative for hemoptysis, sputum production and stridor.   Cardiovascular: Negative for palpitations, claudication, leg swelling and PND.  Gastrointestinal: Positive for nausea and  vomiting. Negative for constipation and diarrhea.  Genitourinary: Negative for dysuria and urgency.  Musculoskeletal: Negative for falls and myalgias.  Neurological: Negative for tingling, tremors and headaches.  Psychiatric/Behavioral: Negative for hallucinations and memory loss. The patient is not nervous/anxious.     Past Medical History:  Diagnosis Date  . Chronic ITP (idiopathic thrombocytopenia) (HCC)   . Coronary artery disease 1992   MI  . DVT (deep venous thrombosis) ([place])   . 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 Hypertension   . Lupus anticoagulant disorder ([place])   . Multiple closed anterior-posterior compression fractures of pelvis ([place])   . Seizures ([place])   . Stroke [facility]) 02/27/2011   Left side weakness    Past Surgical History:  Procedure Laterality Date  . blood clot Right    surgical removal  . CHOLECYSTECTOMY    . CORONARY ANGIOPLASTY  1992  . SPLENECTOMY, TOTAL    . vertebralplasty       reports that he quit smoking about 31 years ago. His smoking use included Cigarettes. He started smoking about 48 years ago. He has a 15.00 pack-year smoking history. He has never used smokeless tobacco. He reports that he does not drink alcohol. His drug history is not on file.  No Known Allergies  Family history: No known clotting disorders in other family members  Prior to Admission medications   Medication Sig Start Date End Date Taking? Authorizing Provider  acetaminophen (TYLENOL) 500 MG tablet Take 1,000 mg by mouth every 8 (eight) hours as needed (pain).     [provider]  alendronate (FOSAMAX) 70 MG tablet Take 70 mg by mouth every Sunday. Take with a  full glass of water on an empty stomach.     [provider]  amLODipine (NORVASC) 2.5 MG tablet Take 2.5 mg by mouth at bedtime.  02/21/15   [provider]  aspirin 81 MG chewable tablet Chew 1 tablet (81 mg total) by mouth at bedtime. 10/23/16   Charlynne Cousins, MD  bethanechol (URECHOLINE) 25 MG  tablet Take 12.5 mg by mouth 2 (two) times daily.  07/27/14   [provider]  buPROPion (WELLBUTRIN XL) 150 MG 24 hr tablet Take 150 mg by mouth daily.  12/19/14   [provider]  Cholecalciferol (VITAMIN D3) 1000 units CAPS Take 2,000 Units by mouth 2 (two) times daily after a meal.     [provider]  CRESTOR 20 MG tablet Take 20 mg by mouth at bedtime.  11/16/14   [provider]  dexamethasone (DECADRON) 4 MG tablet TAKE 10 TABLET (40MG ) BY MOUTH DAILY FOR 2 DAYS THEN STOP 10/16/16   Charlynne Cousins, MD  diphenhydrAMINE (BENADRYL) 25 MG tablet Take 25-50 mg by mouth every 6 (six) hours as needed for allergies.     [provider]  famotidine (PEPCID) 40 MG tablet Take 40 mg by mouth 2 (two) times daily.  04/02/11   [provider]  fluticasone (FLONASE) 50 MCG/ACT nasal spray Place 2 sprays into both nostrils daily as needed for rhinitis.  06/23/14   [provider]  levETIRAcetam (KEPPRA) 500 MG tablet Take 1,500 mg by mouth 2 (two) times daily. 3 tabs  in am and 3 tabs in pm 12/24/11   [provider]  Multiple Vitamins-Minerals (MULTIVITAMIN WITH MINERALS) tablet Take 1 tablet by mouth 2 (two) times daily.     [provider]  NYSTATIN, TOPICAL, POWD Apply 1 application topically 2 (two) times daily. Patient taking differently: Apply 1 application topically 2 (two) times daily as needed (rash).  10/04/14   [place], Holli Humbles, NP  rivaroxaban (XARELTO) 20 MG TABS tablet Take 1 tablet (20 mg total) by mouth daily with breakfast. 10/17/16   Charlynne Cousins, MD  sertraline (ZOLOFT) 100 MG tablet Take 200 mg by mouth daily.     [provider]  Tamsulosin HCl (FLOMAX) 0.4 MG CAPS Take 0.8 mg by mouth at bedtime.     [provider]  traMADol (ULTRAM) 50 MG tablet Take 50 mg by mouth every 6 (six) hours as needed (pain).    [provider]    Physical Exam: Vitals:   10/21/16 1545  10/21/16 1600 10/21/16 1605 10/21/16 1700  BP: (!) 136/49 (!) 107/94 110/77   Pulse: 72 71 81   Resp: (!) 30 (!) 21 (!) 32   Temp:    98.1 F (36.7 C)  TempSrc:    Oral  SpO2: 100% 100% 98%   Weight:      Height:        Constitutional: NAD, calm, comfortable Vitals:   10/21/16 1545 10/21/16 1600 10/21/16 1605 10/21/16 1700  BP: (!) 136/49 (!) 107/94 110/77   Pulse: 72 71 81   Resp: (!) 30 (!) 21 (!) 32   Temp:    98.1 F (36.7 C)  TempSrc:    Oral  SpO2: 100% 100% 98%   Weight:      Height:       Eyes: PERRL, lids and conjunctivae normal ENMT: Mucous membranes are moist. Posterior pharynx clear of any exudate or lesions.Normal dentition.  Neck: normal, supple, no masses, no thyromegaly  Respiratory: clear to auscultation bilaterally, no wheezing, no crackles. Normal respiratory effort. No accessory muscle use.  Cardiovascular: Regular rate and rhythm, no murmurs / rubs / gallops. No extremity edema. 2+ pedal pulses. No carotid bruits.  Abdomen: mildly distended, nontender, decreased BS  Musculoskeletal: no clubbing / cyanosis. No joint deformity upper and lower extremities. Good ROM, no contractures. Normal muscle tone.  Skin: no rashes, lesions, ulcers. No induration Neurologic: CN 2-12 grossly intact. Sensation intact, DTR normal. Strength 5/5 in all 4.  Psychiatric: Normal judgment and insight. Alert and oriented x 3. Normal mood.    Labs on Admission: I have personally reviewed following labs and imaging studies  CBC:  Recent Labs Lab 10/15/16 0449 10/16/16 0457 10/21/16 1228  WBC 16.5* 19.3* 18.7*  NEUTROABS  --   --  9.6*  HGB 9.2* 9.1* 13.7  HCT 28.0* 26.8* 40.8  MCV 87.0 88.4 92.1  PLT 84* 111* 387   Basic Metabolic Panel:  Recent Labs Lab 10/15/16 0449 10/16/16 0457 10/21/16 1228  NA 138 141 135  K 4.3 4.4 4.0  CL 110 110 101  CO2 23 22 22   GLUCOSE 125* 132* 182*  BUN 20 25* 54*  CREATININE 1.24 1.18 1.75*  CALCIUM 8.4* 8.9 9.2  MG  --  2.2   --    GFR: Estimated Creatinine Clearance: 42.5 mL/min (A) (by C-G formula based on SCr of 1.75 mg/dL (H)). Liver Function Tests:  Recent Labs Lab 10/21/16 1228  AST 36  ALT 97*  ALKPHOS 61  BILITOT 1.0  PROT 7.3  ALBUMIN 4.1   No results for input(s): LIPASE, AMYLASE in the last 168 hours. No results for input(s): AMMONIA in the last 168 hours. Coagulation Profile:  Recent Labs Lab 10/15/16 0449 10/21/16 1228  INR 1.36 1.84   Cardiac Enzymes:  Recent Labs Lab 10/21/16 1228  TROPONINI <0.03   BNP (last 3 results) No results for input(s): PROBNP in the last 8760 hours. HbA1C: No results for input(s): HGBA1C in the last 72 hours. CBG:  Recent Labs Lab 10/21/16 1225  GLUCAP 158*   Lipid Profile: No results for input(s): CHOL, HDL, LDLCALC, TRIG, CHOLHDL, LDLDIRECT in the last 72 hours. Thyroid Function Tests: No results for input(s): TSH, T4TOTAL, FREET4, T3FREE, THYROIDAB in the last 72 hours. Anemia Panel: No results for input(s): VITAMINB12, FOLATE, FERRITIN, TIBC, IRON, RETICCTPCT in the last 72 hours. Urine analysis:    Component Value Date/Time   COLORURINE YELLOW 10/21/2016 1354   APPEARANCEUR CLEAR 10/21/2016 1354   LABSPEC 1.036 (H) 10/21/2016 1354   PHURINE 5.5 10/21/2016 1354   GLUCOSEU NEGATIVE 10/21/2016 1354   HGBUR SMALL (A) 10/21/2016 1354   BILIRUBINUR NEGATIVE 10/21/2016 1354   KETONESUR NEGATIVE 10/21/2016 1354   PROTEINUR NEGATIVE 10/21/2016 1354   UROBILINOGEN 1.0 04/04/2010 0110   NITRITE NEGATIVE 10/21/2016 1354   LEUKOCYTESUR NEGATIVE 10/21/2016 1354   Sepsis Labs: !!!!!!!!!!!!!!!!!!!!!!!!!!!!!!!!!!!!!!!!!!!! @LABRCNTIP (procalcitonin:4,lacticidven:4) ) Recent Results (from the past 240 hour(s))  Blood Culture (routine x 2)     Status: None   Collection Time: 10/12/16 11:25 AM  Result Value Ref Range Status   Specimen Description BLOOD RIGHT ANTECUBITAL  Final   Special Requests   Final    BOTTLES DRAWN AEROBIC AND  ANAEROBIC Blood Culture adequate volume   Culture   Final    NO GROWTH 5 DAYS Performed at [facility], [place] [address], [place],  [address]    Report Status 10/17/2016 FINAL  Final  Blood Culture (routine  x 2)     Status: None   Collection Time: 10/12/16 11:45 AM  Result Value Ref Range Status   Specimen Description BLOOD LEFT ANTECUBITAL  Final   Special Requests IN PEDIATRIC BOTTLE Blood Culture adequate volume  Final   Culture   Final    NO GROWTH 5 DAYS Performed at [facility], [place] [address], [place], [place] [address]    Report Status 10/17/2016 FINAL  Final  Urine culture     Status: None   Collection Time: 10/12/16  2:05 PM  Result Value Ref Range Status   Specimen Description URINE, CATHETERIZED  Final   Special Requests NONE  Final   Culture   Final    NO GROWTH Performed at [facility], [place] [address], [place], [place] [address]    Report Status 10/13/2016 FINAL  Final     Radiological Exams on Admission: Ct Head Wo Contrast  Result Date: 10/21/2016 CLINICAL DATA:  Pt comes from Dr. Dicie Beam office. Pt was there for a routine appt and per family become diaphoretic, vomited, and passed out. Pt is pale, diaphoretic and O2 sats are in the 80s on room air. Pt does not wear O2 at home. EXAM: CT HEAD WITHOUT CONTRAST TECHNIQUE: Contiguous axial images were obtained from the base of the skull through the vertex without intravenous contrast. COMPARISON:  MR 09/01/2015 and previous FINDINGS: Brain: Chronic right parietal encephalomalacia. Extensive white matter hypointensity throughout the right cerebellar hemisphere as before. Diffuse parenchymal atrophy with resultant prominence of ventricles sulci. Negative for acute intracranial hemorrhage, midline shift, mass, or mass-effect. Acute infarct may be inapparent on noncontrast CT. Vascular: Atherosclerotic and physiologic intracranial calcifications. Skull: Normal. Negative for fracture or focal lesion.  Sinuses/Orbits: No acute finding. Other: None. IMPRESSION: 1. Negative for bleed or other acute intracranial process. 2. Old right MCA distribution infarct. Electronically Signed   By: Lucrezia Europe M.D.   On: 10/21/2016 14:45   Ct Angio Chest Pe W And/or Wo Contrast  Result Date: 10/21/2016 CLINICAL DATA:  Shortness of breath, recent DVT. EXAM: CT ANGIOGRAPHY CHEST WITH CONTRAST TECHNIQUE: Multidetector CT imaging of the chest was performed using the standard protocol during bolus administration of intravenous contrast. Multiplanar CT image reconstructions and MIPs were obtained to evaluate the vascular anatomy. CONTRAST:  100 cc Isovue 370 COMPARISON:  None. FINDINGS: Cardiovascular: The heart is normal in size. No pericardial effusion. The aorta is normal in caliber. No significant atherosclerotic calcifications. Coronary artery calcifications are noted. The pulmonary arterial tree is fairly well opacified. No definite filling defects to suggest pulmonary embolism. Mediastinum/Nodes: Small scattered mediastinal and hilar lymph nodes but no mass or adenopathy. The esophagus is grossly normal. Lungs/Pleura: No acute pulmonary findings. No infiltrates, edema or effusions. 6.5 mm right middle lobe pulmonary nodule along the minor fissure is most likely a lymph node. No other pulmonary nodules are identified. Basilar scarring changes are noted. No bronchiectasis or interstitial lung disease. Upper Abdomen: No significant upper abdominal findings. 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 The gallbladder surgically absent. The upper abdominal aorta is normal in caliber. Chest wall/ Musculoskeletal: No chest wall mass, supraclavicular or axillary lymphadenopathy. The thyroid gland appears normal. Remote healed rib fractures are noted. There are also vertebral augmentation changes noted at T11 and T12. Review of the MIP images confirms the above findings. IMPRESSION: 1. No CT findings for pulmonary embolism. 2. No  aortic aneurysm or dissection. 3. No acute pulmonary findings. 4. 6.5 mm right  middle lobe pulmonary nodule along the minor fissure is likely a lymph node. Non-contrast chest CT at 6-12 months is recommended. If the nodule is stable at time of repeat CT, then future CT at 18-24 months (from today's scan) is considered optional for low-risk patients, but is recommended for high-risk patients. This recommendation follows the consensus statement: Guidelines for Management of Incidental Pulmonary Nodules Detected on CT Images: From the Fleischner Society 2017; Radiology 2017; 284:228-243. Aortic Atherosclerosis (ICD10-I70.0). Electronically Signed   By: Marijo Sanes M.D.   On: 10/21/2016 13:40    EKG: Independently reviewed. NSR  Assessment/Plan Active Problems:   Immune thrombocytopenic purpura (HCC)   Apnea, sleep   Spastic hemiplegia (HCC)   Lupus anticoagulant disorder ([place])   AKI (acute kidney injury) ([place])   Acute hypoxemic respiratory failure ([place])   1. Possible Sepsis of unclear etiology 1. Pt presents with WBC of 18k (down from 19k from recent discharge), tachypnea, elevated lactate of over 2, hypotension 2. Lactate normalize following IVF 3. BP stable and within normal limits 4. UA unremarkable. Chest imaging without acute process. 5. Blood cx ordered, pending. Pt continued on empiric vanc/zosyn 6. Will check MRSA screen 2. Syncope 1. Unclear etiololgy. Description of symptoms suggestive of possible vaso-vagal vs dehydration 2. Will check orthostatic vitals 3. Head CT unremarkable for acute process 4. Neurologically intact at this time 5. Suspect secondary to above 6. Will order 2d echo 3. OSA 1. Will cont on CPAP at night 4. ITP 1. Stable at this time 2. Repeat bmet in AM 5. Lupus anticoagulant 1. On therapeutic anticoagulation 2. No signs of active bleed at this time 6. Recent DVT 1. Per above, pt is continued on therapeutic anticoagulation 2. Prior documentation  noted. Patient recommended to continue xarelto per Hematology with recommendation for no IVC filter 3. Appears to be stable at this time 7. chroniic anticoagulation 1. Continue on xarelto per home regimen 8. ARF 1. Cr elevated this hospital admit over prior 2. Suspect related to above 3. Will continue patient on 75cc/hr basal IVF 9. Hx of seizures 1. Stable at this time 2. Will continue home AED 10. Nausea 1. Uncertain etiology. Abd appears mildly distended. Family reports increased gas 2. Will check abd xray to r/o constipation/obstruction 11. Leukocytosis 1. Doubt infection given lack of fever and neg chest imaging or UA 2. Blood cx pending 3. Suspect leukocytosis related to recent decadron  DVT prophylaxis: Xarelto  Code Status: Full Family Communication: Pt in room, family at bedside  Disposition Plan: Uncertain at this time  Consults called:  Admission status: require greater than 2 midnight stay to correct sepsis and work up syncope   Kamara Allan, Orpah Melter MD [facility] Pager [phone]  If 7PM-7AM, please contact night-coverage [email or web address] Password [email or web address]  10/21/2016, 5:55 PM

## 2016-10-22 ENCOUNTER — Inpatient Hospital Stay (HOSPITAL_COMMUNITY): Payer: Medicare HMO

## 2016-10-22 ENCOUNTER — Encounter (HOSPITAL_COMMUNITY): Payer: Self-pay

## 2016-10-22 DIAGNOSIS — R55 Syncope and collapse: Secondary | ICD-10-CM

## 2016-10-22 DIAGNOSIS — I743 Embolism and thrombosis of arteries of the lower extremities: Secondary | ICD-10-CM

## 2016-10-22 DIAGNOSIS — R1110 Vomiting, unspecified: Secondary | ICD-10-CM

## 2016-10-22 DIAGNOSIS — D6862 Lupus anticoagulant syndrome: Secondary | ICD-10-CM

## 2016-10-22 DIAGNOSIS — Z8673 Personal history of transient ischemic attack (TIA), and cerebral infarction without residual deficits: Secondary | ICD-10-CM

## 2016-10-22 LAB — COMPREHENSIVE METABOLIC PANEL
ALBUMIN: 3.1 g/dL — AB (ref 3.5–5.0)
ALT: 60 U/L (ref 17–63)
AST: 25 U/L (ref 15–41)
Alkaline Phosphatase: 43 U/L (ref 38–126)
Anion gap: 6 (ref 5–15)
BUN: 40 mg/dL — AB (ref 6–20)
CHLORIDE: 107 mmol/L (ref 101–111)
CO2: 21 mmol/L — AB (ref 22–32)
Calcium: 7.8 mg/dL — ABNORMAL LOW (ref 8.9–10.3)
Creatinine, Ser: 1.39 mg/dL — ABNORMAL HIGH (ref 0.61–1.24)
GFR calc Af Amer: 57 mL/min — ABNORMAL LOW (ref >60)
GFR calc non Af Amer: 49 mL/min — ABNORMAL LOW (ref >60)
GLUCOSE: 135 mg/dL — AB (ref 65–99)
POTASSIUM: 4 mmol/L (ref 3.5–5.1)
Sodium: 134 mmol/L — ABNORMAL LOW (ref 135–145)
Total Bilirubin: 1 mg/dL (ref 0.3–1.2)
Total Protein: 5.4 g/dL — ABNORMAL LOW (ref 6.5–8.1)

## 2016-10-22 LAB — CBC
HEMATOCRIT: 30.7 % — AB (ref 39.0–52.0)
Hemoglobin: 10.3 g/dL — ABNORMAL LOW (ref 13.0–17.0)
MCH: 30.4 pg (ref 26.0–34.0)
MCHC: 33.6 g/dL (ref 30.0–36.0)
MCV: 90.6 fL (ref 78.0–100.0)
Platelets: 259 10*3/uL (ref 150–400)
RBC: 3.39 MIL/uL — ABNORMAL LOW (ref 4.22–5.81)
RDW: 16.4 % — AB (ref 11.5–15.5)
WBC: 26.1 10*3/uL — AB (ref 4.0–10.5)

## 2016-10-22 LAB — URINE CULTURE: Culture: NO GROWTH

## 2016-10-22 LAB — PATHOLOGIST SMEAR REVIEW

## 2016-10-22 LAB — ECHOCARDIOGRAM COMPLETE
Height: 72 in
Weight: 3280 oz

## 2016-10-22 MED ORDER — ACETAMINOPHEN 500 MG PO TABS: 500.0000 mg | ORAL_TABLET | Freq: Four times a day (QID) | ORAL | Status: DC | PRN

## 2016-10-22 MED ORDER — BETHANECHOL CHLORIDE 10 MG PO TABS
10.0000 mg | ORAL_TABLET | Freq: Two times a day (BID) | ORAL | Status: DC
  Administered 2016-10-22 – 2016-10-23 (×2): 10 mg via ORAL
  Filled 2016-10-22 (×3): qty 1

## 2016-10-22 MED ORDER — TAMSULOSIN HCL 0.4 MG PO CAPS
0.8000 mg | ORAL_CAPSULE | Freq: Every day | ORAL | Status: DC
  Administered 2016-10-22 – 2016-10-25 (×4): 0.8 mg via ORAL
  Filled 2016-10-22 (×4): qty 2

## 2016-10-22 MED ORDER — ASPIRIN 81 MG PO CHEW
81.0000 mg | CHEWABLE_TABLET | Freq: Every day | ORAL | Status: DC
Start: 2016-10-22 — End: 2016-10-26
  Administered 2016-10-22 – 2016-10-25 (×4): 81 mg via ORAL
  Filled 2016-10-22 (×4): qty 1

## 2016-10-22 NOTE — Progress Notes (Signed)
CM consult for home health needs. Pt independent with ADLs prior to admission. Will await PT/OT evals and assist as needed. Marney Doctor RN,BSN,NCM [phone]

## 2016-10-22 NOTE — Progress Notes (Signed)
PROGRESS NOTE    Mark [name]  WUJ:[id number] DOB: 02-Feb-1946 DOA: 10/21/2016 PCP: Mark Stains, MD  Outpatient Specialists:     Brief Narrative:  51 ? 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  Admit 6/30-7/4 acute LE dvt-placed on decadron then for ITP CVA L sided paresis-uses cane at baseline Recurrent falls Epilepsy / keppra-last seizure probably about 8 years ago ADHD Hep B + CAD s/p Angio 1990's  1992 CAD BPH  Patient admitted [facility] secondary to syncopal episode with nausea vomiting Patient was sitting in the wheelchair and had "eyes rolling backward" Has been eating and drinking moderately-but has felt dehydrated He also did not have any urinary incontinence nor tongue biting-previously has been on phenobarbital prior to his last major seizure 8 years ago  Initially found to have that pressures in the 80s [place] >2 CT chest negative Vancomycin and Zosyn were started on admission and patient was directly admitted   Assessment & Plan:   Active Problems:   Immune thrombocytopenic purpura (HCC)   Apnea, sleep   Spastic hemiplegia (HCC)   Lupus anticoagulant disorder (HCC)   AKI (acute kidney injury) ([place])   Acute hypoxemic respiratory failure (HCC)   Sepsis (HCC)   Syncope versus occult seizure Severe orthostasis  Patient has had no further recurrence of events of 7/9  Orthostatics are significantly positive-discontinuing amlodipine 2.5 at bedtime  Very unlikely given that the patient being on Xarelto that this is any type of infarct in the brain  Overall feels improved-eating and drinking present  Given his history of seizure, would continue Keppra-would not load him would hold on EEG for now but would monitor during the course of today in the stepdown unit and if has stabilized without the fever or chills we can send him to a regular floor versus home  tomorrow--given the propensity of tramadol to sometimes cause seizures, have discontinued this completely  ? Sepsis Relatively immunocompromised host  Spiked a fever 102 overnight 7/9-blood culture X2 7/9., UC 7/9 still pending  Has been getting Tylenol for headaches-change dose to 500 mg I discontinued temporarily his vancomycin and Zosyn and we'll look for fever curve and further indicators of sepsis  I'm not convinced he had sepsis on admission  Has recently completed a drawn which can also cause a leukocytosis and left shift  Expect patient will not need antibiotics but will monitor to be sure  Obstipation/constipation  We will schedule MiraLAX 17 g for mild constipation once daily  Monitor  Coombs warm + AIHA ITP Prior DVT LLE/RLE 1991/2001 Recent acute LE DVT currently on Xarelto  Appreciate oncology input   Volume depletion Acute kidney injury Metabolic/renal acidosis causing lactic acidosis 2.1 on admission  Usual baseline creatinine 15/1.1-->54/1.7 on admit  Continue IV saline 75 cc/H  Repeat complete metabolic panel a.m.  Lactic acid has normalized with repletion of fluids  BPH  Resume Flomax 0.4 at bedtime  Would cautiously resume bethanechol his usual home dose of 12 with 5-25-->10 mgii/day as this has anticholinergic effects and may potentiate or have worsened some symptoms on admission  Hyperlipidemia  Holding Crestor 40 for now  Bipolar  Continue Zoloft 200 at bedtime, continue Wellbutrin 150 XL daily   Patient is on Xarelto and we'll continue this lifelong Stable for transfer to telemetry in a.m. Would monitor over the course of today in stepped-down look for fever and further  symptoms  Consultants:   Oncology  Procedures:   none  Antimicrobials:   Vanc/zosyn--stopped 7/10    Subjective: Alert pleasant oriented no distress doing fair About to have a big meal and is very hungry Has been feeling better and has not had a recurrence of his  admission symptoms of nausea although felt pretty dizzy sitting up   Objective: Vitals:   10/22/16 0300 10/22/16 0400 10/22/16 0500 10/22/16 0600  BP: (!) 103/54 (!) 115/51 (!) 134/52 (!) 113/51  Pulse: 73 78 81 73  Resp: (!) 28 17 13 18   Temp:  100.3 F (37.9 C)    TempSrc:  Oral    SpO2: 94% 95% 94% 93%  Weight:      Height:        Intake/Output Summary (Last 24 hours) at 10/22/16 0814 Last data filed at 10/22/16 0600  Gross per 24 hour  Intake          3216.25 ml  Output             1700 ml  Net          1516.25 ml   Filed Weights   10/21/16 1307  Weight: 93 kg (205 lb)    Examination: Alert pleasant oriented 4 No icterus no pallor Neck soft supple Mallampati 2 S1 and S2 no murmur rub or gallop Soft nontender nondistended no rebound or guarding Chest is clinically clear Right lower extremity has bruising with plaque and system with ascites Left lower extremity is wrapped Power is 1/5 in all left upper and lower extremities Reflexes are 2/3 on the right side are brisk on the left Euthymic and congruent  Data Reviewed: I have personally reviewed following labs and imaging studies  CBC:  Recent Labs Lab 10/16/16 0457 10/21/16 1228 10/22/16 0332  WBC 19.3* 18.7* 26.1*  NEUTROABS  --  9.6*  --   HGB 9.1* 13.7 10.3*  HCT 26.8* 40.8 30.7*  MCV 88.4 92.1 90.6  PLT 111* 348 469   Basic Metabolic Panel:  Recent Labs Lab 10/16/16 0457 10/21/16 1228 10/22/16 0332  NA 141 135 134*  K 4.4 4.0 4.0  CL 110 101 107  CO2 22 22 21*  GLUCOSE 132* 182* 135*  BUN 25* 54* 40*  CREATININE 1.18 1.75* 1.39*  CALCIUM 8.9 9.2 7.8*  MG 2.2  --   --    GFR: Estimated Creatinine Clearance: 53.5 mL/min (A) (by C-G formula based on SCr of 1.39 mg/dL (H)). Liver Function Tests:  Recent Labs Lab 10/21/16 1228 10/22/16 0332  AST 36 25  ALT 97* 60  ALKPHOS 61 43  BILITOT 1.0 1.0  PROT 7.3 5.4*  ALBUMIN 4.1 3.1*   No results for input(s): LIPASE, AMYLASE in the  last 168 hours. No results for input(s): AMMONIA in the last 168 hours. Coagulation Profile:  Recent Labs Lab 10/21/16 1228  INR 1.84   Cardiac Enzymes:  Recent Labs Lab 10/21/16 1228  TROPONINI <0.03   BNP (last 3 results) No results for input(s): PROBNP in the last 8760 hours. HbA1C: No results for input(s): HGBA1C in the last 72 hours. CBG:  Recent Labs Lab 10/21/16 1225  GLUCAP 158*   Lipid Profile: No results for input(s): CHOL, HDL, LDLCALC, TRIG, CHOLHDL, LDLDIRECT in the last 72 hours. Thyroid Function Tests: No results for input(s): TSH, T4TOTAL, FREET4, T3FREE, THYROIDAB in the last 72 hours. Anemia Panel: No results for input(s): VITAMINB12, FOLATE, FERRITIN, TIBC, IRON, RETICCTPCT in the last 72 hours.  Urine analysis:    Component Value Date/Time   COLORURINE YELLOW 10/21/2016 1354   APPEARANCEUR CLEAR 10/21/2016 1354   LABSPEC 1.036 (H) 10/21/2016 1354   PHURINE 5.5 10/21/2016 1354   GLUCOSEU NEGATIVE 10/21/2016 1354   HGBUR SMALL (A) 10/21/2016 1354   BILIRUBINUR NEGATIVE 10/21/2016 1354   KETONESUR NEGATIVE 10/21/2016 1354   PROTEINUR NEGATIVE 10/21/2016 1354   UROBILINOGEN 1.0 04/04/2010 0110   NITRITE NEGATIVE 10/21/2016 1354   LEUKOCYTESUR NEGATIVE 10/21/2016 1354   Sepsis Labs: @LABRCNTIP (procalcitonin:4,lacticidven:4)  ) Recent Results (from the past 240 hour(s))  Blood Culture (routine x 2)     Status: None   Collection Time: 10/12/16 11:25 AM  Result Value Ref Range Status   Specimen Description BLOOD RIGHT ANTECUBITAL  Final   Special Requests   Final    BOTTLES DRAWN AEROBIC AND ANAEROBIC Blood Culture adequate volume   Culture   Final    NO GROWTH 5 DAYS Performed at [facility], [place] [address], [place], [place] [address]    Report Status 10/17/2016 FINAL  Final  Blood Culture (routine x 2)     Status: None   Collection Time: 10/12/16 11:45 AM  Result Value Ref Range Status   Specimen Description BLOOD LEFT  ANTECUBITAL  Final   Special Requests IN PEDIATRIC BOTTLE Blood Culture adequate volume  Final   Culture   Final    NO GROWTH 5 DAYS Performed at [facility], [place] [address] [place], [place] [address]    Report Status 10/17/2016 FINAL  Final  Urine culture     Status: None   Collection Time: 10/12/16  2:05 PM  Result Value Ref Range Status   Specimen Description URINE, CATHETERIZED  Final   Special Requests NONE  Final   Culture   Final    NO GROWTH Performed at [facility], [place] [address], [place],  [address]    Report Status 10/13/2016 FINAL  Final  MRSA PCR Screening     Status: None   Collection Time: 10/21/16  5:04 PM  Result Value Ref Range Status   MRSA by PCR NEGATIVE NEGATIVE Final    Comment:        The GeneXpert MRSA Assay (FDA approved for NASAL specimens only), is one component of a comprehensive MRSA colonization surveillance program. It is not intended to diagnose MRSA infection nor to guide or monitor treatment for MRSA infections.          Radiology Studies: Ct Head Wo Contrast  Result Date: 10/21/2016 CLINICAL DATA:  Pt comes from Dr. Dicie Beam office. Pt was there for a routine appt and per family become diaphoretic, vomited, and passed out. Pt is pale, diaphoretic and O2 sats are in the 80s on room air. Pt does not wear O2 at home. EXAM: CT HEAD WITHOUT CONTRAST TECHNIQUE: Contiguous axial images were obtained from the base of the skull through the vertex without intravenous contrast. COMPARISON:  MR 09/01/2015 and previous FINDINGS: Brain: Chronic right parietal encephalomalacia. Extensive white matter hypointensity throughout the right cerebellar hemisphere as before. Diffuse parenchymal atrophy with resultant prominence of ventricles sulci. Negative for acute intracranial hemorrhage, midline shift, mass, or mass-effect. Acute infarct may be inapparent on noncontrast CT. Vascular: Atherosclerotic and physiologic intracranial  calcifications. Skull: Normal. Negative for fracture or focal lesion. Sinuses/Orbits: No acute finding. Other: None. IMPRESSION: 1. Negative for bleed or other acute intracranial process. 2. Old right MCA distribution infarct. Electronically Signed   By: Keturah Barre  Vernard Gambles M.D.   On: 10/21/2016 14:45   Ct Angio Chest Pe W And/or Wo Contrast  Result Date: 10/21/2016 CLINICAL DATA:  Shortness of breath, recent DVT. EXAM: CT ANGIOGRAPHY CHEST WITH CONTRAST TECHNIQUE: Multidetector CT imaging of the chest was performed using the standard protocol during bolus administration of intravenous contrast. Multiplanar CT image reconstructions and MIPs were obtained to evaluate the vascular anatomy. CONTRAST:  100 cc Isovue 370 COMPARISON:  None. FINDINGS: Cardiovascular: The heart is normal in size. No pericardial effusion. The aorta is normal in caliber. No significant atherosclerotic calcifications. Coronary artery calcifications are noted. The pulmonary arterial tree is fairly well opacified. No definite filling defects to suggest pulmonary embolism. Mediastinum/Nodes: Small scattered mediastinal and hilar lymph nodes but no mass or adenopathy. The esophagus is grossly normal. Lungs/Pleura: No acute pulmonary findings. No infiltrates, edema or effusions. 6.5 mm right middle lobe pulmonary nodule along the minor fissure is most likely a lymph node. No other pulmonary nodules are identified. Basilar scarring changes are noted. No bronchiectasis or interstitial lung disease. Upper Abdomen: No significant upper abdominal findings. 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 The gallbladder surgically absent. The upper abdominal aorta is normal in caliber. Chest wall/ Musculoskeletal: No chest wall mass, supraclavicular or axillary lymphadenopathy. The thyroid gland appears normal. Remote healed rib fractures are noted. There are also vertebral augmentation changes noted at T11 and T12. Review of the MIP images confirms the above  findings. IMPRESSION: 1. No CT findings for pulmonary embolism. 2. No aortic aneurysm or dissection. 3. No acute pulmonary findings. 4. 6.5 mm right middle lobe pulmonary nodule along the minor fissure is likely a lymph node. Non-contrast chest CT at 6-12 months is recommended. If the nodule is stable at time of repeat CT, then future CT at 18-24 months (from today's scan) is considered optional for low-risk patients, but is recommended for high-risk patients. This recommendation follows the consensus statement: Guidelines for Management of Incidental Pulmonary Nodules Detected on CT Images: From the Fleischner Society 2017; Radiology 2017; 284:228-243. Aortic Atherosclerosis (ICD10-I70.0). Electronically Signed   By: Marijo Sanes M.D.   On: 10/21/2016 13:40   Dg Abd Portable 1v  Result Date: 10/21/2016 CLINICAL DATA:  Nausea and vomiting. EXAM: PORTABLE ABDOMEN - 1 VIEW COMPARISON:  Included upper abdomen from chest CTA earlier this day. Abdominal CT 08/27/2007 FINDINGS: Mild gaseous gastric distention. No small bowel dilatation. Moderate to large colonic stool burden. Surgical clips in the right upper quadrant from appendectomy. No evidence of free air. Lab thoracic spine compression fractures with augmentation. IMPRESSION: 1. Moderate to large colonic stool burden. 2. No bowel obstruction.  Mild gaseous gastric distention. Electronically Signed   By: Jeb Levering M.D.   On: 10/21/2016 21:32        Scheduled Meds: . amLODipine  2.5 mg Oral QHS  . buPROPion  150 mg Oral Daily  . famotidine  40 mg Oral BID  . levETIRAcetam  1,500 mg Oral BID  . mouth rinse  15 mL Mouth Rinse BID  . rivaroxaban  20 mg Oral Q breakfast  . rosuvastatin  20 mg Oral QHS  . sertraline  200 mg Oral Daily   Continuous Infusions: . sodium chloride 75 mL/hr at 10/22/16 0600  . piperacillin-tazobactam (ZOSYN)  IV Stopped (10/22/16 0739)  . vancomycin Stopped (10/22/16 0310)     LOS: 1 day    Time spent:  [place], MD [facility]   If 7PM-7AM, please contact night-coverage  [email or web address] Password [email or web address] 10/22/2016, 8:14 AM

## 2016-10-22 NOTE — Consult Note (Signed)
Referral MD  Reason for Referral: Thromboembolic disease secondary to lupus anticoagulant; immune thrombocytopenia-remission; CVA-ischemic; possible sepsis   Chief Complaint  Patient presents with  . Loss of Consciousness  : I got sick in your office.  HPI: Mark [name] is well-known to me. He is a 71 year old white male. I've known him for over 15 years.  He was recently discharged from the hospital. He had a thrombus in the right leg. He has a history of lupus anticoagulant. He has history of a ischemic stroke. He has weakness on his left side which is been chronic.  He had been on Coumadin. He actually was above therapeutic level when he was admitted with the thromboembolic disease.  He is now on Xarelto.  He came into the office just for follow-up after being in the hospital. I saw him in the waiting room and he looked quite good. About 2 months later, he was thrown up all over the place in our office. He was not that responsive. He had a decreased oxygen level. His pulse was initially weak and then strengthened. As such, I don't know if he has some kind of vasovagal reaction.  We got him down to the emergency room. He was admitted. He's been on antibiotics.  A CT of the head is negative.  A CT angiogram the chest did not show any pulmonary embolism.  He's not thrombocytopenia. His white cell count was slightly elevated.  He now is on IV antibiotics. Ulcers are pending. He looks much better. He looks like he is back to his baseline.  He does have the bleed into the back of the right lower leg. This is from a ruptured Bakers cyst.  His labs this morning show his platelet count to be 20 59,000. White cell count 26,000. Hemoglobin 10.3.  He apparently had temperature 102.9 yesterday. Currently, his temperature is 100.3. He is on Zosyn and vancomycin.  He had abdominal x-rays done. These showed a lot of stool in the colon.  He's had no cough. His no shortness of breath.       Past Medical History:  Diagnosis Date  . Chronic ITP (idiopathic thrombocytopenia) (HCC)   . Coronary artery disease 1992   MI  . DVT (deep venous thrombosis) ([place])   . 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 Hypertension   . Lupus anticoagulant disorder ([place])   . Multiple closed anterior-posterior compression fractures of pelvis ([place])   . Seizures ([place])   . Stroke [facility]) 02/27/2011   Left side weakness  :  Past Surgical History:  Procedure Laterality Date  . blood clot Right    surgical removal  . CHOLECYSTECTOMY    . CORONARY ANGIOPLASTY  1992  . SPLENECTOMY, TOTAL    . vertebralplasty    :   Current Facility-Administered Medications:  .  0.9 %  sodium chloride infusion, , Intravenous, Continuous, Donne Hazel, MD, Last Rate: 75 mL/hr at 10/22/16 0600 .  acetaminophen (TYLENOL) tablet 650 mg, 650 mg, Oral, Q6H PRN, Gardiner Barefoot, NP, 650 mg at 10/21/16 2351 .  amLODipine (NORVASC) tablet 2.5 mg, 2.5 mg, Oral, QHS, Donne Hazel, MD, 2.5 mg at 10/21/16 2108 .  buPROPion (WELLBUTRIN XL) 24 hr tablet 150 mg, 150 mg, Oral, Daily, Donne Hazel, MD .  famotidine (PEPCID) tablet 40 mg, 40 mg, Oral, BID, Donne Hazel, MD, 40 mg at 10/21/16 2109 .  fluticasone (FLONASE) 50 MCG/ACT nasal spray 2 spray, 2 spray, Each Nare, Daily PRN,  Donne Hazel, MD .  levETIRAcetam [facility]) tablet 1,500 mg, 1,500 mg, Oral, BID, Donne Hazel, MD, 1,500 mg at 10/21/16 2108 .  MEDLINE mouth rinse, 15 mL, Mouth Rinse, BID, Lavina Hamman, MD .  morphine 2 MG/ML injection 2 mg, 2 mg, Intravenous, Q4H PRN, Donne Hazel, MD .  piperacillin-tazobactam (ZOSYN) IVPB 3.375 g, 3.375 g, Intravenous, Q8H, Romona Curls, [facility], Last Rate: 12.5 mL/hr at 10/22/16 0339, 3.375 g at 10/22/16 0339 .  rivaroxaban (XARELTO) tablet 20 mg, 20 mg, Oral, Q breakfast, Donne Hazel, MD .  rosuvastatin (CRESTOR) tablet 20 mg, 20 mg, Oral, QHS, Donne Hazel, MD, 20 mg at 10/21/16 2108 .  sertraline (ZOLOFT)  tablet 200 mg, 200 mg, Oral, Daily, Donne Hazel, MD .  traMADol Veatrice Bourbon) tablet 50 mg, 50 mg, Oral, Q6H PRN, Donne Hazel, MD, 50 mg at 10/22/16 0518 .  vancomycin (VANCOCIN) IVPB 750 mg/150 ml premix, 750 mg, Intravenous, Q12H, Romona Curls, RPH, Stopped at 10/22/16 0310:  . amLODipine  2.5 mg Oral QHS  . buPROPion  150 mg Oral Daily  . famotidine  40 mg Oral BID  . levETIRAcetam  1,500 mg Oral BID  . mouth rinse  15 mL Mouth Rinse BID  . rivaroxaban  20 mg Oral Q breakfast  . rosuvastatin  20 mg Oral QHS  . sertraline  200 mg Oral Daily  :  No Known Allergies:  No family history on file.:  Social History   Social History  . Marital status: Married    Spouse name: N/A  . Number of children: N/A  . Years of education: N/A   Occupational History  . Not on file.   Social History Main Topics  . Smoking status: Former Smoker    Packs/day: 1.00    Years: 15.00    Types: Cigarettes    Start date: 06/12/1968    Quit date: 01/27/1985  . Smokeless tobacco: Never Used     Comment: quit smoking 28 yeras ago  . Alcohol use No  . Drug use: Unknown  . Sexual activity: Not on file   Other Topics Concern  . Not on file   Social History Narrative  . No narrative on file  :  Pertinent items are noted in HPI.  Exam: Patient Vitals for the past 24 hrs:  BP Temp Temp src Pulse Resp SpO2 Height Weight  10/22/16 0600 (!) 113/51 - - 73 18 93 % - -  10/22/16 0500 (!) 134/52 - - 81 13 94 % - -  10/22/16 0400 (!) 115/51 100.3 F (37.9 C) Oral 78 17 95 % - -  10/22/16 0300 (!) 103/54 - - 73 (!) 28 94 % - -  10/22/16 0200 (!) 111/46 - - 76 (!) 23 95 % - -  10/22/16 0156 - 99.2 F (37.3 C) Oral - - - - -  10/22/16 0114 - 100.3 F (37.9 C) Oral - - - - -  10/22/16 0100 (!) 106/55 - - 80 (!) 24 95 % - -  10/22/16 0039 - (!) 102.9 F (39.4 C) Oral - - - - -  10/22/16 0024 - (!) 101.9 F (38.8 C) Oral - - - - -  10/22/16 0000 (!) 105/50 - - 81 (!) 24 95 % - -  10/21/16  2300 (!) 109/53 - - 82 (!) 24 96 % - -  10/21/16 2200 (!) 113/53 - - 85 (!) 26 96 % - -  10/21/16 2100 (!) 84/48 - - 98 (!) 38 91 % - -  10/21/16 2054 (!) 114/53 - - 85 (!) 29 96 % - -  10/21/16 2053 (!) 119/57 - - 84 (!) 23 95 % - -  10/21/16 2000 - 100 F (37.8 C) Oral - - - - -  10/21/16 1700 - 98.1 F (36.7 C) Oral - - - - -  10/21/16 1653 (!) 143/63 - - 87 (!) 23 100 % - -  10/21/16 1605 110/77 - - 81 (!) 32 98 % - -  10/21/16 1600 (!) 107/94 - - 71 (!) 21 100 % - -  10/21/16 1545 (!) 136/49 - - 72 (!) 30 100 % - -  10/21/16 1515 (!) 111/53 97.8 F (36.6 C) Oral 73 20 94 % - -  10/21/16 1445 (!) 110/54 - - 61 (!) 21 94 % - -  10/21/16 1420 (!) 94/55 - - 65 (!) 21 96 % - -  10/21/16 1415 (!) 92/40 - - 64 (!) 24 96 % - -  10/21/16 1400 97/68 - - 70 20 95 % - -  10/21/16 1345 (!) 103/52 - - 64 (!) 21 94 % - -  10/21/16 1330 (!) 96/51 - - 66 20 93 % - -  10/21/16 1315 107/60 - - - - - - -  10/21/16 1307 - - - - - - 6' (1.829 m) 205 lb (93 kg)  10/21/16 1303 (!) 94/57 - - 60 20 93 % - -  10/21/16 1300 (!) 105/59 99.3 F (37.4 C) Rectal 60 (!) 22 92 % - -  10/21/16 1217 100/61 (!) 96.2 F (35.7 C) Oral 71 (!) 21 95 % - -    As above    Recent Labs  10/21/16 1228 10/22/16 0332  WBC 18.7* 26.1*  HGB 13.7 10.3*  HCT 40.8 30.7*  PLT 348 259    Recent Labs  10/21/16 1228 10/22/16 0332  NA 135 134*  K 4.0 4.0  CL 101 107  CO2 22 21*  GLUCOSE 182* 135*  BUN 54* 40*  CREATININE 1.75* 1.39*  CALCIUM 9.2 7.8*    Blood smear review:  None  Pathology: None     Assessment and Plan:  Mark [name] is a 71 year old white male. He recently was hospitalized for recurrent thromboembolic disease. This had to be from his lupus anticoagulant.  I'll measure exactly what happened in the office. He clearly was "out of it". His oxygen saturation was quite low area and he never loss consciousness however.  It will be interesting to see what his cultures show. His white cell count  is quite elevated. He is not on any steroids.  I don't see any obvious focal source for infection.  Hopefully, he will be moved out of the ICU today. He looks quite good.  We will follow along.  I appreciate all the great help that he is getting from all the staff in the ICU. As always, there compassion and professionalism are on full display.  Lattie Haw, MD  Psalms 145:8

## 2016-10-22 NOTE — Evaluation (Signed)
Physical Therapy Evaluation Patient Details Name: Mark [name] MRN: [id number] DOB: 1945-12-21 Today's Date: 10/22/2016   History of Present Illness  Mark [name] is a 70 y.o. male with medical history significant of recent acute DVT on therapeutic xarelto, hx of ARF, CAD, chronic ITP, seizure disorder, HTN who presents 7/9 for  concerns for possible sepsis with associated syncope. Patient was recently discharged on 7/4 where pt was found to have super therapeutic INR with recommendation to transition to [facility] per hematology recs. Patient was also found to have a large, leaking/ruptured baker's cyst which otherwise remained stable. Patient presented to Hematology clinic for f/u where he was noted to have episode of n/v and subsequent syncop  Clinical Impression  The patient  Is very unsteady today due to only having L AFO for ambulation. 1 week ago ambulated x 320' with mod Indpendence.Pt admitted with above diagnosis. Pt currently with functional limitations due to the deficits listed below (see PT Problem List). Pt will benefit from skilled PT to increase their independence and safety with mobility to allow discharge to the venue listed below.       Follow Up Recommendations Outpatient PT    Equipment Recommendations  None recommended by PT    Recommendations for Other Services       Precautions / Restrictions Precautions Precautions: Fall Precaution Comments: LAFO      Mobility  Bed Mobility Overal bed mobility: Needs Assistance Bed Mobility: Supine to Sit     Supine to sit: HOB elevated;Min assist     General bed mobility comments: used rail  Transfers Overall transfer level: Needs assistance Equipment used: Straight cane Transfers: Sit to/from Stand Sit to Stand: Max assist;+2 safety/equipment;+2 physical assistance         General transfer comment: only has L AFO nor right shoe  Ambulation/Gait Ambulation/Gait assistance: Max assist;+2 physical  assistance;+2 safety/equipment Ambulation Distance (Feet): 10 Feet Assistive device: Straight cane Gait Pattern/deviations: Step-to pattern;Steppage     General Gait Details: significant balance and dectreased control of left leg with no right shoe. After toileting, recliner brought up to pivot to it.  Stairs            Wheelchair Mobility    Modified Rankin (Stroke Patients Only)       Balance Overall balance assessment: Needs assistance Sitting-balance support: Feet supported;No upper extremity supported Sitting balance-Leahy Scale: Fair     Standing balance support: During functional activity;Single extremity supported Standing balance-Leahy Scale: Zero                               Pertinent Vitals/Pain Pain Score: 5  Pain Location: R LE Pain Descriptors / Indicators: Discomfort Pain Intervention(s): Monitored during session    Home Living Family/patient expects to be discharged to:: Private residence Living Arrangements: Spouse/significant other Available Help at Discharge: Family Type of Home: House Home Access: Stairs to enter Entrance Stairs-Rails: Right Entrance Stairs-Number of Steps: 3 Home Layout: One level Home Equipment: Cane - single point Additional Comments: AFO    Prior Function Level of Independence: Independent with assistive device(s)         Comments: uses cane for ambulation     Hand Dominance        Extremity/Trunk Assessment   Upper Extremity Assessment Upper Extremity Assessment: LUE deficits/detail LUE Deficits / Details: nonfunctional    Lower Extremity Assessment LLE Deficits / Details: drop foot L LE. Hip, knee strength  at least 3/5    Cervical / Trunk Assessment Cervical / Trunk Assessment: Normal  Communication   Communication: No difficulties  Cognition Arousal/Alertness: Awake/alert Behavior During Therapy: WFL for tasks assessed/performed;Impulsive Overall Cognitive Status: Within  Functional Limits for tasks assessed                                        General Comments      Exercises     Assessment/Plan    PT Assessment Patient needs continued PT services  PT Problem List Decreased mobility;Pain       PT Treatment Interventions Gait training;DME instruction;Therapeutic activities;Therapeutic exercise;Patient/family education;Functional mobility training    PT Goals (Current goals can be found in the Care Plan section)  Acute Rehab PT Goals Patient Stated Goal: regain PLOF. home soon PT Goal Formulation: With patient Time For Goal Achievement: 11/05/16 Potential to Achieve Goals: Good    Frequency Min 3X/week   Barriers to discharge        Co-evaluation               AM-PAC PT "6 Clicks" Daily Activity  Outcome Measure Difficulty turning over in bed (including adjusting bedclothes, sheets and blankets)?: Total Difficulty moving from lying on back to sitting on the side of the bed? : Total Difficulty sitting down on and standing up from a chair with arms (e.g., wheelchair, bedside commode, etc,.)?: Total Help needed moving to and from a bed to chair (including a wheelchair)?: Total Help needed walking in hospital room?: Total Help needed climbing 3-5 steps with a railing? : Total 6 Click Score: 6    End of Session Equipment Utilized During Treatment: Gait belt Activity Tolerance: Patient limited by pain Patient left: in chair;with call bell/phone within reach;with chair alarm set Nurse Communication: Mobility status PT Visit Diagnosis: Muscle weakness (generalized) (M62.81);Difficulty in walking, not elsewhere classified (R26.2);Unsteadiness on feet (R26.81);Hemiplegia and hemiparesis Hemiplegia - Right/Left: Left Hemiplegia - dominant/non-dominant: Non-dominant Hemiplegia - caused by: Other cerebrovascular disease    Time: 1253-1320 PT Time Calculation (min) (ACUTE ONLY): 27 min   Charges:   PT Evaluation $PT  Eval Moderate Complexity: 1 Procedure PT Treatments $Gait Training: 8-22 mins   PT G CodesTresa Endo PT [phone]   Claretha Cooper 10/22/2016, 6:10 PM

## 2016-10-22 NOTE — Progress Notes (Signed)
  Echocardiogram 2D Echocardiogram has been performed.  Darlina Sicilian M 10/22/2016, 11:13 AM

## 2016-10-23 DIAGNOSIS — D649 Anemia, unspecified: Secondary | ICD-10-CM

## 2016-10-23 LAB — PROTIME-INR
INR: 1.49
PROTHROMBIN TIME: 18.2 s — AB (ref 11.4–15.2)

## 2016-10-23 LAB — COMPREHENSIVE METABOLIC PANEL
ALT: 45 U/L (ref 17–63)
AST: 21 U/L (ref 15–41)
Albumin: 3 g/dL — ABNORMAL LOW (ref 3.5–5.0)
Alkaline Phosphatase: 43 U/L (ref 38–126)
Anion gap: 6 (ref 5–15)
BILIRUBIN TOTAL: 0.7 mg/dL (ref 0.3–1.2)
BUN: 28 mg/dL — AB (ref 6–20)
CHLORIDE: 109 mmol/L (ref 101–111)
CO2: 23 mmol/L (ref 22–32)
CREATININE: 1.46 mg/dL — AB (ref 0.61–1.24)
Calcium: 8 mg/dL — ABNORMAL LOW (ref 8.9–10.3)
GFR, EST AFRICAN AMERICAN: 54 mL/min — AB (ref >60)
GFR, EST NON AFRICAN AMERICAN: 47 mL/min — AB (ref >60)
Glucose, Bld: 110 mg/dL — ABNORMAL HIGH (ref 65–99)
POTASSIUM: 4.2 mmol/L (ref 3.5–5.1)
Sodium: 138 mmol/L (ref 135–145)
TOTAL PROTEIN: 5.6 g/dL — AB (ref 6.5–8.1)

## 2016-10-23 LAB — CBC WITH DIFFERENTIAL/PLATELET
BASOS ABS: 0 10*3/uL (ref 0.0–0.1)
Basophils Relative: 0 %
EOS ABS: 0.9 10*3/uL — AB (ref 0.0–0.7)
EOS PCT: 5 %
HCT: 29.1 % — ABNORMAL LOW (ref 39.0–52.0)
HEMOGLOBIN: 9.8 g/dL — AB (ref 13.0–17.0)
LYMPHS ABS: 2.4 10*3/uL (ref 0.7–4.0)
Lymphocytes Relative: 15 %
MCH: 30.6 pg (ref 26.0–34.0)
MCHC: 33.7 g/dL (ref 30.0–36.0)
MCV: 90.9 fL (ref 78.0–100.0)
Monocytes Absolute: 3.3 10*3/uL — ABNORMAL HIGH (ref 0.1–1.0)
Monocytes Relative: 20 %
NEUTROS PCT: 60 %
Neutro Abs: 9.8 10*3/uL — ABNORMAL HIGH (ref 1.7–7.7)
PLATELETS: 262 10*3/uL (ref 150–400)
RBC: 3.2 MIL/uL — AB (ref 4.22–5.81)
RDW: 16.6 % — ABNORMAL HIGH (ref 11.5–15.5)
WBC: 16.3 10*3/uL — AB (ref 4.0–10.5)

## 2016-10-23 LAB — SEROTONIN RELEASE ASSAY (SRA)

## 2016-10-23 LAB — IRON AND TIBC
IRON: 27 ug/dL — AB (ref 45–182)
Saturation Ratios: 17 % — ABNORMAL LOW (ref 17.9–39.5)
TIBC: 160 ug/dL — AB (ref 250–450)
UIBC: 133 ug/dL

## 2016-10-23 LAB — FERRITIN: FERRITIN: 1063 ng/mL — AB (ref 24–336)

## 2016-10-23 MED ORDER — PANTOPRAZOLE SODIUM 40 MG PO TBEC
40.0000 mg | DELAYED_RELEASE_TABLET | Freq: Every day | ORAL | Status: DC
  Administered 2016-10-23 – 2016-10-26 (×4): 40 mg via ORAL
  Filled 2016-10-23 (×4): qty 1

## 2016-10-23 MED ORDER — SENNOSIDES-DOCUSATE SODIUM 8.6-50 MG PO TABS
1.0000 | ORAL_TABLET | Freq: Two times a day (BID) | ORAL | Status: DC
  Administered 2016-10-23 – 2016-10-25 (×6): 1 via ORAL
  Filled 2016-10-23 (×6): qty 1

## 2016-10-23 MED ORDER — POLYETHYLENE GLYCOL 3350 17 G PO PACK
17.0000 g | PACK | Freq: Every day | ORAL | Status: DC
  Administered 2016-10-23: 17 g via ORAL
  Filled 2016-10-23: qty 1

## 2016-10-23 NOTE — Progress Notes (Signed)
Mark [name] is doing okay. He is now off antibiotics. His cultures were all negative.  Some physical therapy I think would help him quite a bit. He did eat out of bed to go to the bathroom.  His blood pressure appears to have some orthostatic changes. S1 his daughter says. Not sure why this would be.  He's had no obvious bleeding. He does have the bleeding into the right lower leg. This might be getting a little bit better.  He still had low-grade temperature. This morning, his temperature is 100.5. His blood pressure was 126/62.  I really cannot find anything on his physical exam that was different. There might be less swelling in the right lower leg.  I'm still not sure as to what happened to him on Monday, in the office. I would've thought that some infection would have been found. I suppose he could have a viral infection.  I'm not sure what is going on with this orthostatic hypotension.  Again, some physical therapy I think will help.  His hemoglobin is dropping. When he came in his hemoglobin was 13.7. I do not know if this might be a low bit of dehydration that could've led to this. His hemoglobin is 9.8. His platelet count is doing well. His white cell count is coming down.  His renal function is at had on the high side. However, I'll think this would be a factor in his anemia.  I will be out of town for the next 4 days. If any problems arise, please let the on-call doctor for group know.  Lattie Haw, MD  Psalm 56:3-4

## 2016-10-23 NOTE — Evaluation (Signed)
Occupational Therapy Evaluation Patient Details Name: Mark [name] MRN: [id number] DOB: 08/21/1945 Today's Date: 10/23/2016    History of Present Illness Mark [name] is a 71 y.o. male with medical history significant of recent acute DVT on therapeutic xarelto, hx of ARF, CAD, chronic ITP, seizure disorder, HTN who presents 7/9 for  concerns for possible sepsis with associated syncope. Patient was recently discharged on 7/4 where pt was found to have super therapeutic INR with recommendation to transition to [facility] per hematology recs. Patient was also found to have a large, leaking/ruptured baker's cyst which otherwise remained stable. Patient presented to Hematology clinic for f/u where he was noted to have episode of n/v and subsequent syncop   Clinical Impression   Pt admitted with syncope. Pt currently with functional limitations due to the deficits listed below (see OT Problem List).  Pt will benefit from skilled OT to increase their safety and independence with ADL and functional mobility for ADL to facilitate discharge to venue listed below.   DO NOT FEEL wife can handle him at current level.    Follow Up Recommendations  CIR;SNF    Equipment Recommendations  Other (comment)       Precautions / Restrictions Precautions Precautions: Fall Precaution Comments: LAFO      Mobility Bed Mobility Overal bed mobility: Needs Assistance Bed Mobility: Supine to Sit     Supine to sit: HOB elevated;Min assist Sit to supine: Min assist   General bed mobility comments: used rail  Transfers Overall transfer level: Needs assistance   Transfers: Sit to/from Stand Sit to Stand: Max assist;+2 safety/equipment;+2 physical assistance         General transfer comment: used L AFO    Balance Overall balance assessment: Needs assistance Sitting-balance support: Feet supported;No upper extremity supported Sitting balance-Leahy Scale: Fair     Standing balance support: During  functional activity;Single extremity supported Standing balance-Leahy Scale: Zero                             ADL either performed or assessed with clinical judgement   ADL              Pt overall mod- Max A with ADL activity this OT session. Will further evaluate next OT session.  OT session this day focused on sitting EOB and sit to stand transitions                                              Pertinent Vitals/Pain Pain Assessment: No/denies pain     Hand Dominance  Right   Extremity/Trunk Assessment Upper Extremity Assessment LUE Deficits / Details: nonfunctional- although elbow extension present as well as some trace shoulder depression   Lower Extremity Assessment LLE Deficits / Details: drop foot L LE. Hip, knee strength at least 3/5   Cervical / Trunk Assessment Cervical / Trunk Assessment: Normal   Communication Communication Communication: No difficulties   Cognition Arousal/Alertness: Awake/alert Behavior During Therapy: WFL for tasks assessed/performed;Impulsive Overall Cognitive Status: Within Functional Limits for tasks assessed                                     General Comments   Pt fattigued very quickly sitting EOB  With  2 sit to stand transitions    Exercises  Focused on  LUE PROM and AAROM sitting EOB.          Home Living Family/patient expects to be discharged to:: Private residence Living Arrangements: Spouse/significant other Available Help at Discharge: Family Type of Home: House Home Access: Stairs to enter Technical brewer of Steps: 3 Entrance Stairs-Rails: Right Home Layout: One level         Biochemist, clinical: [place] - single point   Additional Comments: AFO      Prior Functioning/Environment Level of Independence: Independent with assistive device(s)                 OT Problem List: Decreased strength;Decreased range of  motion;Decreased activity tolerance;Decreased coordination;Decreased knowledge of use of DME or AE;Impaired UE functional use;Impaired balance (sitting and/or standing)      OT Treatment/Interventions: Self-care/ADL training;DME and/or AE instruction;Patient/family education    OT Goals(Current goals can be found in the care plan section) Acute Rehab OT Goals Patient Stated Goal: regain PLOF. home soon OT Goal Formulation: With patient/family Time For Goal Achievement: 11/06/16 Potential to Achieve Goals: Good  OT Frequency: Min 2X/week   Barriers to D/C: Decreased caregiver support  do not feel pts wife can handle him at current level          AM-PAC PT "6 Clicks" Daily Activity     Outcome Measure Help from another person eating meals?: A Little Help from another person taking care of personal grooming?: A Little Help from another person toileting, which includes using toliet, bedpan, or urinal?: Total Help from another person bathing (including washing, rinsing, drying)?: A Lot Help from another person to put on and taking off regular upper body clothing?: A Lot Help from another person to put on and taking off regular lower body clothing?: Total 6 Click Score: 12   End of Session Equipment Utilized During Treatment: Gait belt Nurse Communication: Mobility status  Activity Tolerance: Patient limited by fatigue Patient left: in bed;with call bell/phone within reach;with family/visitor present  OT Visit Diagnosis: Unsteadiness on feet (R26.81);Muscle weakness (generalized) (M62.81);Repeated falls (R29.6);History of falling (Z91.81);Other abnormalities of gait and mobility (R26.89);Hemiplegia and hemiparesis Hemiplegia - Right/Left: Left Hemiplegia - dominant/non-dominant: Non-Dominant Hemiplegia - caused by: Cerebral infarction                Time: 1420-1500 OT Time Calculation (min): 40 min Charges:  OT General Charges $OT Visit: 1 Procedure OT Evaluation $OT Eval  Moderate Complexity: 1 Procedure OT Treatments $Self Care/Home Management : 23-37 mins G-Codes:     Kari Baars, OT [phone]  Payton Mccallum D 10/23/2016, 3:20 PM

## 2016-10-23 NOTE — Progress Notes (Addendum)
[facility] Progress Note  Patient: Mark [name] IDP:[id number]   PCP: Harlan Stains, MD DOB: [birth year]   DOA: 10/21/2016   DOS: 10/23/2016   Date of Service: the patient was seen and examined on 10/23/2016  Subjective: Still feeling dizzy and lightheaded. No chest pain and abdominal pain. No nausea no vomiting. No diarrhea no constipation.  Brief hospital course: Pt. with PMH of acute DVT on Xarelto, recent Baker's cyst rupture, COPD, chronic ITP, seizures, BPH; admitted on 10/21/2016, presented with complaint of nausea vomiting and passing out episode, was found to have severe orthostatic hypotension. Currently further plan is continue supportive treatment.  Assessment and Plan: 1. Syncope. Orthostatic hypotension. Laboratory multifactorial since the patient is on very high dose of Keppra, patient is also on tamsulosin as well as bethanechol. I do not suspect that the patient actually had a seizure episode. Currently I would discontinue bethanechol. Orthostasis has improved but still positive. Use compressive stockings. May require midodrine if does not improve. Continue to monitor on telemetry at present. Antihypertensive medication have been discontinued.  2. Recent DVT. Factor V Leyden deficiency. Chronic ITP. Appreciate input from oncology. Continue on Xarelto. CT PE negative for pulmonary embolism. Mild decrease in hemoglobin, we'll continue to monitor at present at present no active bleeding reported.  3. SIRS. Patient has temperature elevation, leukocytosis on admission meeting the criteria for SIRS. At present I do not suspect that the patient has an active infection. This could be secondary from his clock as well as possibility from Baker's cyst rupture causing severe inflammation. Currently monitor off of the antibiotics.  4. Constipation. Oral regimen changed. We'll monitor. Likely contributing to patient's nausea on presentation.  5. BPH. Continue Flomax,  discontinue bethanechol. Monitor.  6. Acute kidney injury. Renal function better now. Continue IV hydration at present due to presence of orthostasis.  7. Ruptured Baker's cyst. Right leg bruising. Continue to monitor at present. May require imaging if anemia gets worse  8. History of seizure disorder. Patient is on 1500 mg Keppra twice a day. This certainly can cause patient's presentations with syncope and hypotension. Currently we will monitor and try to adjust other medications. Do not suspect the patient had seizures.  9. Suspected gastritis. On Pepcid 40 mg twice a day. We change it to Protonix daily.  Diet: regular diet DVT Prophylaxis: on therapeutic anticoagulation.  Advance goals of care discussion: full code  Family Communication: family was present at bedside, at the time of interview. The pt provided permission to discuss medical plan with the family. Opportunity was given to ask question and all questions were answered satisfactorily.   Disposition:  Discharge to SNF. Physical therapy initially recommended outpatient PT although the patient only admitted to 10 feet and was symptomatic. I'm unsure how patient's wife can handle patient at home. OT recommends SNF versus CIR. Social worker consulted..  Consultants: oncology Procedures: Echocardiogram   Antibiotics: Anti-infectives    Start     Dose/Rate Route Frequency Ordered Stop   10/22/16 0230  vancomycin (VANCOCIN) IVPB 750 mg/150 ml premix  Status:  Discontinued     750 mg 150 mL/hr over 60 Minutes Intravenous Every 12 hours 10/21/16 1330 10/22/16 1227   10/21/16 1930  piperacillin-tazobactam (ZOSYN) IVPB 3.375 g  Status:  Discontinued     3.375 g 12.5 mL/hr over 240 Minutes Intravenous Every 8 hours 10/21/16 1330 10/22/16 1227   10/21/16 1345  vancomycin (VANCOCIN) IVPB 1000 mg/200 mL premix     1,000 mg 200 mL/hr  over 60 Minutes Intravenous  Once 10/21/16 1330 10/21/16 1548   10/21/16 1245   piperacillin-tazobactam (ZOSYN) IVPB 3.375 g     3.375 g 100 mL/hr over 30 Minutes Intravenous  Once 10/21/16 1230 10/21/16 1339   10/21/16 1245  vancomycin (VANCOCIN) IVPB 1000 mg/200 mL premix     1,000 mg 200 mL/hr over 60 Minutes Intravenous  Once 10/21/16 1230 10/21/16 1422       Objective: Physical Exam: Vitals:   10/22/16 1931 10/22/16 2142 10/23/16 0614 10/23/16 0846  BP: (!) 124/56  126/62 (!) 119/54  Pulse: 78  77 79  Resp: (!) 21  18   Temp: (!) 100.6 F (38.1 C) 99.1 F (37.3 C) (!) 100.5 F (38.1 C) 100.3 F (37.9 C)  TempSrc: Oral Oral Oral Oral  SpO2: 98%  97%   Weight: 96.2 kg (212 lb 1.3 oz)     Height: 6' (1.829 m)       Intake/Output Summary (Last 24 hours) at 10/23/16 1740 Last data filed at 10/23/16 1600  Gross per 24 hour  Intake             2000 ml  Output                0 ml  Net             2000 ml   Filed Weights   10/21/16 1307 10/22/16 1931  Weight: 93 kg (205 lb) 96.2 kg (212 lb 1.3 oz)   General: Alert, Awake and Oriented to Time, Place and Person. Appear in moderate distress, affect appropriate Eyes: PERRL, Conjunctiva normal ENT: Oral Mucosa clear moist. Neck: no JVD, no Abnormal Mass Or lumps Cardiovascular: S1 and S2 Present, no Murmur, Peripheral Pulses Present Respiratory: normal respiratory effort, Bilateral Air entry equal and Decreased, no use of accessory muscle, Clear to Auscultation, no Crackles, no wheezes Abdomen: Bowel Sound present, Soft and no tenderness, no hernia Skin: right leg redness, no Rash, no induration Extremities: right Pedal edema, no calf tenderness Neurologic: Grossly no focal neuro deficit. Bilaterally Equal motor strength  Data Reviewed: CBC:  Recent Labs Lab 10/21/16 1228 10/22/16 0332 10/23/16 0510  WBC 18.7* 26.1* 16.3*  NEUTROABS 9.6*  --  9.8*  HGB 13.7 10.3* 9.8*  HCT 40.8 30.7* 29.1*  MCV 92.1 90.6 90.9  PLT 348 259 604   Basic Metabolic Panel:  Recent Labs Lab 10/21/16 1228  10/22/16 0332 10/23/16 0510  NA 135 134* 138  K 4.0 4.0 4.2  CL 101 107 109  CO2 22 21* 23  GLUCOSE 182* 135* 110*  BUN 54* 40* 28*  CREATININE 1.75* 1.39* 1.46*  CALCIUM 9.2 7.8* 8.0*    Liver Function Tests:  Recent Labs Lab 10/21/16 1228 10/22/16 0332 10/23/16 0510  AST 36 25 21  ALT 97* 60 45  ALKPHOS 61 43 43  BILITOT 1.0 1.0 0.7  PROT 7.3 5.4* 5.6*  ALBUMIN 4.1 3.1* 3.0*   No results for input(s): LIPASE, AMYLASE in the last 168 hours. No results for input(s): AMMONIA in the last 168 hours. Coagulation Profile:  Recent Labs Lab 10/21/16 1228 10/23/16 0510  INR 1.84 1.49   Cardiac Enzymes:  Recent Labs Lab 10/21/16 1228  TROPONINI <0.03   BNP (last 3 results) No results for input(s): PROBNP in the last 8760 hours. CBG:  Recent Labs Lab 10/21/16 1225  GLUCAP 158*   Studies: No results found.  Scheduled Meds: . aspirin  81 mg Oral QHS  .  buPROPion  150 mg Oral Daily  . famotidine  40 mg Oral BID  . levETIRAcetam  1,500 mg Oral BID  . mouth rinse  15 mL Mouth Rinse BID  . polyethylene glycol  17 g Oral Daily  . rivaroxaban  20 mg Oral Q breakfast  . senna-docusate  1 tablet Oral BID  . sertraline  200 mg Oral Daily  . tamsulosin  0.8 mg Oral QHS   Continuous Infusions: . sodium chloride 75 mL/hr at 10/23/16 1445   PRN Meds: acetaminophen, fluticasone  Time spent: 35 minutes  Author: Berle Mull, MD [facility] Pager: [phone] 10/23/2016 5:40 PM  If 7PM-7AM, please contact night-coverage at [email or web address], password [facility]

## 2016-10-24 DIAGNOSIS — R911 Solitary pulmonary nodule: Secondary | ICD-10-CM | POA: Diagnosis present

## 2016-10-24 DIAGNOSIS — I959 Hypotension, unspecified: Secondary | ICD-10-CM

## 2016-10-24 DIAGNOSIS — N179 Acute kidney failure, unspecified: Principal | ICD-10-CM

## 2016-10-24 LAB — CBC WITH DIFFERENTIAL/PLATELET
BASOS PCT: 0 %
Basophils Absolute: 0 10*3/uL (ref 0.0–0.1)
EOS ABS: 0.7 10*3/uL (ref 0.0–0.7)
EOS PCT: 6 %
HCT: 29.3 % — ABNORMAL LOW (ref 39.0–52.0)
Hemoglobin: 9.7 g/dL — ABNORMAL LOW (ref 13.0–17.0)
LYMPHS ABS: 2 10*3/uL (ref 0.7–4.0)
Lymphocytes Relative: 18 %
MCH: 30.1 pg (ref 26.0–34.0)
MCHC: 33.1 g/dL (ref 30.0–36.0)
MCV: 91 fL (ref 78.0–100.0)
Monocytes Absolute: 2.7 10*3/uL — ABNORMAL HIGH (ref 0.1–1.0)
Monocytes Relative: 25 %
NEUTROS PCT: 51 %
Neutro Abs: 5.4 10*3/uL (ref 1.7–7.7)
PLATELETS: 266 10*3/uL (ref 150–400)
RBC: 3.22 MIL/uL — AB (ref 4.22–5.81)
RDW: 16.7 % — ABNORMAL HIGH (ref 11.5–15.5)
WBC: 10.9 10*3/uL — AB (ref 4.0–10.5)

## 2016-10-24 LAB — COMPREHENSIVE METABOLIC PANEL
ALBUMIN: 3 g/dL — AB (ref 3.5–5.0)
ALT: 47 U/L (ref 17–63)
ANION GAP: 7 (ref 5–15)
AST: 28 U/L (ref 15–41)
Alkaline Phosphatase: 42 U/L (ref 38–126)
BUN: 20 mg/dL (ref 6–20)
CHLORIDE: 110 mmol/L (ref 101–111)
CO2: 23 mmol/L (ref 22–32)
CREATININE: 1.34 mg/dL — AB (ref 0.61–1.24)
Calcium: 8.2 mg/dL — ABNORMAL LOW (ref 8.9–10.3)
GFR calc non Af Amer: 52 mL/min — ABNORMAL LOW (ref >60)
GFR, EST AFRICAN AMERICAN: 60 mL/min — AB (ref >60)
GLUCOSE: 109 mg/dL — AB (ref 65–99)
Potassium: 4 mmol/L (ref 3.5–5.1)
SODIUM: 140 mmol/L (ref 135–145)
Total Bilirubin: 0.6 mg/dL (ref 0.3–1.2)
Total Protein: 5.7 g/dL — ABNORMAL LOW (ref 6.5–8.1)

## 2016-10-24 LAB — MAGNESIUM: Magnesium: 2 mg/dL (ref 1.7–2.4)

## 2016-10-24 LAB — CREATININE, SERUM
CREATININE: 1.34 mg/dL — AB (ref 0.61–1.24)
GFR calc Af Amer: 60 mL/min — ABNORMAL LOW (ref >60)
GFR calc non Af Amer: 52 mL/min — ABNORMAL LOW (ref >60)

## 2016-10-24 NOTE — Progress Notes (Signed)
[facility] Progress Note  Patient: Mark [name] DJM:[id number]   PCP: Harlan Stains, MD DOB: 24-May-1945   DOA: 10/21/2016   DOS: 10/24/2016   Date of Service: the patient was seen and examined on 10/24/2016  Subjective: feels better  Brief hospital course: Pt. with PMH of acute DVT on Xarelto, recent Baker's cyst rupture, COPD, chronic ITP, seizures, BPH; admitted on 10/21/2016, presented with complaint of nausea vomiting and passing out episode, was found to have severe orthostatic hypotension.  Assessment and Plan: 1. Syncope/Orthostatic hypotension -due to dehydration/SIRS and patient is on very high dose of Keppra, patient is also on tamsulosin as well as bethanechol. -seizure not suspected -agree with stopping bethanechol. Continue flomax for now -repeat Orthostatics in am, stop IVF, BP improving  2. Recent DVT. Factor V Leyden deficiency and lupus anticoagulant/Chronic ITP. Appreciate input from oncology, Continue on Xarelto/warfarin stopped last admit CT PE negative for pulmonary embolism. Mild decrease in hemoglobin, we'll continue to monitor at present at present no active bleeding reported.  3. SIRS. -presented with temp of 102, leukocytosis on admission meeting the criteria for SIRS. -agree that no clinical evidence of active infection at this time -WBC and fever improving, ? Viral, recent steroid use also contributing -monitor off Abx  4. BPH. Continue Flomax, discontinued bethanechol. -no issues now  6. Acute kidney injury. Renal function better now. Continue IV hydration at present due to presence of orthostasis. -improved with hydration, Stop IVF  7. Recent Ruptured Baker's cyst. With extensive inflammation and reactive changes, hematoma -no evidence of active infection at this time -seen by ortho last admit, supportive Rx recommended -MRI last admit reviewed  8. History of seizure disorder. Patient is on 1500 mg Keppra twice a day. -stable  9.  Suspected gastritis. On Pepcid 40 mg twice a day. We change it to Protonix daily.  Diet: regular diet DVT Prophylaxis: on xarelto  Advance goals of care discussion: full code  Family Communication:wife at bedside  Disposition: SNF vs HH/CIR, await PT re-eval  Consultants: oncology Procedures: Echocardiogram   Antibiotics: Anti-infectives    Start     Dose/Rate Route Frequency Ordered Stop   10/22/16 0230  vancomycin (VANCOCIN) IVPB 750 mg/150 ml premix  Status:  Discontinued     750 mg 150 mL/hr over 60 Minutes Intravenous Every 12 hours 10/21/16 1330 10/22/16 1227   10/21/16 1930  piperacillin-tazobactam (ZOSYN) IVPB 3.375 g  Status:  Discontinued     3.375 g 12.5 mL/hr over 240 Minutes Intravenous Every 8 hours 10/21/16 1330 10/22/16 1227   10/21/16 1345  vancomycin (VANCOCIN) IVPB 1000 mg/200 mL premix     1,000 mg 200 mL/hr over 60 Minutes Intravenous  Once 10/21/16 1330 10/21/16 1548   10/21/16 1245  piperacillin-tazobactam (ZOSYN) IVPB 3.375 g     3.375 g 100 mL/hr over 30 Minutes Intravenous  Once 10/21/16 1230 10/21/16 1339   10/21/16 1245  vancomycin (VANCOCIN) IVPB 1000 mg/200 mL premix     1,000 mg 200 mL/hr over 60 Minutes Intravenous  Once 10/21/16 1230 10/21/16 1422       Objective: Physical Exam: Vitals:   10/23/16 0846 10/23/16 2134 10/24/16 0531 10/24/16 1343  BP: (!) 119/54 (!) 106/55 136/68 113/81  Pulse: 79 73 70 77  Resp:  18 18   Temp: 100.3 F (37.9 C) 99.3 F (37.4 C) 99.3 F (37.4 C) 98.7 F (37.1 C)  TempSrc: Oral Oral Oral Oral  SpO2:  96% 96% 97%  Weight:  Height:        Intake/Output Summary (Last 24 hours) at 10/24/16 1401 Last data filed at 10/24/16 0220  Gross per 24 hour  Intake             1700 ml  Output              900 ml  Net              800 ml   Filed Weights   10/21/16 1307 10/22/16 1931  Weight: 93 kg (205 lb) 96.2 kg (212 lb 1.3 oz)    Gen: Awake, Alert, Oriented X 3,  HEENT: PERRLA, Neck supple, no  JVD Lungs: Good air movement bilaterally, CTAB CVS: RRR,No Gallops,Rubs or new Murmurs Abd: soft, Non tender, non distended, BS present Extremities: R calf with ecchymoses, swelling Skin: ecchymotic changes over R calf   Data Reviewed: CBC:  Recent Labs Lab 10/21/16 1228 10/22/16 0332 10/23/16 0510 10/24/16 0417  WBC 18.7* 26.1* 16.3* 10.9*  NEUTROABS 9.6*  --  9.8* 5.4  HGB 13.7 10.3* 9.8* 9.7*  HCT 40.8 30.7* 29.1* 29.3*  MCV 92.1 90.6 90.9 91.0  PLT 348 259 262 449   Basic Metabolic Panel:  Recent Labs Lab 10/21/16 1228 10/22/16 0332 10/23/16 0510 10/24/16 0417  NA 135 134* 138 140  K 4.0 4.0 4.2 4.0  CL 101 107 109 110  CO2 22 21* 23 23  GLUCOSE 182* 135* 110* 109*  BUN 54* 40* 28* 20  CREATININE 1.75* 1.39* 1.46* 1.34*  1.34*  CALCIUM 9.2 7.8* 8.0* 8.2*  MG  --   --   --  2.0    Liver Function Tests:  Recent Labs Lab 10/21/16 1228 10/22/16 0332 10/23/16 0510 10/24/16 0417  AST 36 25 21 28   ALT 97* 60 45 47  ALKPHOS 61 43 43 42  BILITOT 1.0 1.0 0.7 0.6  PROT 7.3 5.4* 5.6* 5.7*  ALBUMIN 4.1 3.1* 3.0* 3.0*   No results for input(s): LIPASE, AMYLASE in the last 168 hours. No results for input(s): AMMONIA in the last 168 hours. Coagulation Profile:  Recent Labs Lab 10/21/16 1228 10/23/16 0510  INR 1.84 1.49   Cardiac Enzymes:  Recent Labs Lab 10/21/16 1228  TROPONINI <0.03   BNP (last 3 results) No results for input(s): PROBNP in the last 8760 hours. CBG:  Recent Labs Lab 10/21/16 1225  GLUCAP 158*   Studies: No results found.  Scheduled Meds: . aspirin  81 mg Oral QHS  . buPROPion  150 mg Oral Daily  . levETIRAcetam  1,500 mg Oral BID  . mouth rinse  15 mL Mouth Rinse BID  . pantoprazole  40 mg Oral Daily  . polyethylene glycol  17 g Oral Daily  . rivaroxaban  20 mg Oral Q breakfast  . senna-docusate  1 tablet Oral BID  . sertraline  200 mg Oral Daily  . tamsulosin  0.8 mg Oral QHS   Continuous Infusions:  PRN Meds:  acetaminophen, fluticasone  Time spent: 35 minutes  Author: Domenic Polite, MD [facility] Pager: [phone] 10/24/2016 2:01 PM  If 7PM-7AM, please contact night-coverage at [email or web address], password [facility]

## 2016-10-24 NOTE — Care Management Note (Signed)
Case Management Note  Patient Details  Name: ARYA BOXLEY MRN: [id number] Date of Birth: 1945-12-28  Subjective/Objective: OT recc CIR; PT to re eval-await recc. CIR coordinator already following. Await recc.                   Action/Plan:d/c plan CIR   Expected Discharge Date:                  Expected Discharge Plan:  IP Rehab Facility  In-House Referral:  Clinical Social Work  Discharge planning Services  CM Consult  Post Acute Care Choice:    Choice offered to:     DME Arranged:    DME Agency:     HH Arranged:    [place] Agency:     Status of Service:  In process, will continue to follow  If discussed at Long Length of Stay Meetings, dates discussed:    Additional Comments:  Dessa Phi, RN 10/24/2016, 3:58 PM

## 2016-10-24 NOTE — Progress Notes (Signed)
I spoke with pt's wife by phone and she is requesting [facility] in [place], not inpt rehab. I have notified RN Lucas. [phone]

## 2016-10-24 NOTE — Progress Notes (Signed)
Occupational Therapy Treatment Patient Details Name: Mark [name] MRN: [id number] DOB: [birth year] Today's Date: 10/24/2016    History of present illness Mark [name] is a 71 y.o. male with medical history significant of recent acute DVT on therapeutic xarelto, hx of ARF, CAD, chronic ITP, seizure disorder, HTN who presents 7/9 for  concerns for possible sepsis with associated syncope. Patient was recently discharged on 7/4 where pt was found to have super therapeutic INR with recommendation to transition to [facility] per hematology recs. Patient was also found to have a large, leaking/ruptured baker's cyst which otherwise remained stable. Patient presented to Hematology clinic for f/u where he was noted to have episode of n/v and subsequent syncop   OT comments  Pt has a great caregiver who needs him back to his PLOF prior to this admission. Pt very motivated. Pt would do well on inpatient rehab prior to Dc home   Follow Up Recommendations  CIR;SNF    Equipment Recommendations  Other (comment)       Precautions / Restrictions Precautions Precautions: Fall Precaution Comments: LAFO       Mobility Bed Mobility Overal bed mobility: Needs Assistance Bed Mobility: Supine to Sit     Supine to sit: HOB elevated;Min assist Sit to supine: Min assist   General bed mobility comments: used rail  Transfers Overall transfer level: Needs assistance   Transfers: Sit to/from Stand Sit to Stand: Max assist;+2 safety/equipment;+2 physical assistance         General transfer comment: used L AFO    Balance Overall balance assessment: Needs assistance Sitting-balance support: Feet supported;No upper extremity supported Sitting balance-Leahy Scale: Fair     Standing balance support: During functional activity;Single extremity supported Standing balance-Leahy Scale: Zero                             ADL either performed or assessed with clinical judgement   ADL  Overall ADL's : Needs assistance/impaired                 Upper Body Dressing : Moderate assistance;Sitting   Lower Body Dressing: Maximal assistance;Total assistance;Sit to/from stand       Toileting- Water quality scientist and Hygiene: Total assistance;Sitting/lateral lean;Sit to/from stand         General ADL Comments: Pt will need significant A with ADL's upon DC at his current functional level.  Pt would benefit from post acute rehab prior to Dc home.       Vision Patient Visual Report: No change from baseline            Cognition Arousal/Alertness: Awake/alert Behavior During Therapy: WFL for tasks assessed/performed;Impulsive Overall Cognitive Status: Within Functional Limits for tasks assessed                                          Exercises General Exercises - Upper Extremity Shoulder Flexion: PROM;Left;10 reps Shoulder ABduction: PROM;Left;20 reps;AAROM Elbow Flexion: PROM;Left Elbow Extension: AAROM;Left Wrist Flexion: PROM;Left;20 reps Wrist Extension: PROM;Left;20 reps Digit Composite Flexion: PROM;20 reps;Left   Shoulder Instructions       General Comments      Pertinent Vitals/ Pain       Pain Assessment: No/denies pain     Prior Functioning/Environment              Frequency  Min 2X/week  Progress Toward Goals  OT Goals(current goals can now be found in the care plan section)  Progress towards OT goals: Progressing toward goals     Plan Discharge plan remains appropriate    Co-evaluation                 AM-PAC PT "6 Clicks" Daily Activity     Outcome Measure   Help from another person eating meals?: A Little Help from another person taking care of personal grooming?: A Little Help from another person toileting, which includes using toliet, bedpan, or urinal?: Total Help from another person bathing (including washing, rinsing, drying)?: A Lot Help from another person to put on and  taking off regular upper body clothing?: A Lot Help from another person to put on and taking off regular lower body clothing?: Total 6 Click Score: 12    End of Session Equipment Utilized During Treatment: Gait belt  OT Visit Diagnosis: Unsteadiness on feet (R26.81);Muscle weakness (generalized) (M62.81);Repeated falls (R29.6);History of falling (Z91.81);Other abnormalities of gait and mobility (R26.89);Hemiplegia and hemiparesis Hemiplegia - Right/Left: Left Hemiplegia - dominant/non-dominant: Non-Dominant Hemiplegia - caused by: Cerebral infarction   Activity Tolerance Patient limited by fatigue   Patient Left in bed;with call bell/phone within reach;with family/visitor present   Nurse Communication Mobility status        Time: 6433-2951 OT Time Calculation (min): 34 min  Charges: OT General Charges $OT Visit: 1 Procedure OT Treatments $Self Care/Home Management : 8-22 mins $Therapeutic Activity: 8-22 mins  [place], [place]   Payton Mccallum D 10/24/2016, 2:19 PM

## 2016-10-24 NOTE — Progress Notes (Signed)
Physical Therapy Treatment Patient Details Name: Mark [name] MRN: [id number] DOB: 1945-10-27 Today's Date: 10/24/2016    History of Present Illness Mark [name] is a 71 y.o. male with medical history significant of recent acute DVT on therapeutic xarelto, hx of ARF, CAD, chronic ITP, seizure disorder, HTN who presents 7/9 for  concerns for possible sepsis with associated syncope. Patient was recently discharged on 7/4 where pt was found to have super therapeutic INR with recommendation to transition to [place] per hematology recs. Patient was also found to have a large, leaking/ruptured baker's cyst which otherwise remained stable. Patient presented to Hematology clinic for f/u where he was noted to have episode of n/v and subsequent syncope    PT Comments    Pt assisted with ambulating in hallway today and requiring at least moderate assistance (+2 for safety).  Pt may benefit from trying hemiwalker next visit for more support.  Spouse reports pt needs to be at least supervision level if returning home as she has back issues and cannot provide physical assist for pt at this time.  Pt and spouse agreeable for post acute rehab facility upon d/c.  Pt's LE strength grossly at least 3+/5 throughout with L LE only slightly weaker then R LE however very poor coordination in L LE.  Pt also with some tightness of hip  internal rotators.  Pt very pleasant and motivated however reports increased fatigue today.  Pt's spouse also reports pt was going to "stroke camp" every year.   Follow Up Recommendations  CIR (if not CIR then SNF)     Equipment Recommendations  None recommended by PT    Recommendations for Other Services       Precautions / Restrictions Precautions Precautions: Fall Precaution Comments: L AFO Restrictions Weight Bearing Restrictions: No    Mobility  Bed Mobility Overal bed mobility: Needs Assistance Bed Mobility: Supine to Sit;Sit to Supine     Supine to sit:  Supervision;HOB elevated Sit to supine: Supervision   General bed mobility comments: pt utilized rail  Transfers Overall transfer level: Needs assistance Equipment used: Straight cane Transfers: Sit to/from Stand Sit to Stand: Mod assist;+2 physical assistance         General transfer comment: pt able to place his feet in shoes, assist for tightening AFO (which spouse usually provides), assist to rise and steady, posterior LOB upon rise  Ambulation/Gait Ambulation/Gait assistance: Mod assist;+2 physical assistance Ambulation Distance (Feet): 100 Feet Assistive device: Straight cane Gait Pattern/deviations: Step-through pattern     General Gait Details: significant circumduction and internal rotation of L LE despite using AFO, demonstrated gait pattern to pt and circumduction improved slightly, encourage more knee flexion L LE, pt also provided with forearm support on L UE as pt reporting pain in R leg and fatigue, may try hemiwalker next visit for more support   Stairs            Wheelchair Mobility    Modified Rankin (Stroke Patients Only)       Balance Overall balance assessment: Needs assistance Sitting-balance support: Feet supported;No upper extremity supported Sitting balance-Leahy Scale: Fair     Standing balance support: During functional activity;Single extremity supported Standing balance-Leahy Scale: Zero Standing balance comment: posterior LOB with standing                            Cognition Arousal/Alertness: Awake/alert Behavior During Therapy: WFL for tasks assessed/performed;Impulsive Overall Cognitive Status: Within  Functional Limits for tasks assessed                                        Exercises General Exercises - Upper Extremity Shoulder Flexion: PROM;Left;10 reps Shoulder ABduction: PROM;Left;20 reps;AAROM Elbow Flexion: PROM;Left Elbow Extension: AAROM;Left Wrist Flexion: PROM;Left;20 reps Wrist  Extension: PROM;Left;20 reps Digit Composite Flexion: PROM;20 reps;Left    General Comments        Pertinent Vitals/Pain Pain Assessment: 0-10 Pain Score: 3  Pain Location: R LE Pain Descriptors / Indicators: Discomfort Pain Intervention(s): Limited activity within patient's tolerance;Monitored during session;Repositioned    Home Living                      Prior Function            PT Goals (current goals can now be found in the care plan section) Progress towards PT goals: Progressing toward goals    Frequency    Min 3X/week      PT Plan Current plan remains appropriate    Co-evaluation              AM-PAC PT "6 Clicks" Daily Activity  Outcome Measure  Difficulty turning over in bed (including adjusting bedclothes, sheets and blankets)?: Total Difficulty moving from lying on back to sitting on the side of the bed? : A Little Difficulty sitting down on and standing up from a chair with arms (e.g., wheelchair, bedside commode, etc,.)?: Total Help needed moving to and from a bed to chair (including a wheelchair)?: A Lot Help needed walking in hospital room?: A Lot Help needed climbing 3-5 steps with a railing? : Total 6 Click Score: 10    End of Session Equipment Utilized During Treatment: Gait belt Activity Tolerance: Patient limited by fatigue Patient left: in bed;with call bell/phone within reach;with family/visitor present   PT Visit Diagnosis: Difficulty in walking, not elsewhere classified (R26.2);Unsteadiness on feet (R26.81)     Time: 6314-9702 PT Time Calculation (min) (ACUTE ONLY): 31 min  Charges:  $Gait Training: 8-22 mins                    G Codes:      Carmelia Bake, PT, DPT 10/24/2016 Pager: [phone]    York Ram E 10/24/2016, 3:58 PM

## 2016-10-25 DIAGNOSIS — J9601 Acute respiratory failure with hypoxia: Secondary | ICD-10-CM

## 2016-10-25 LAB — BASIC METABOLIC PANEL WITH GFR
Anion gap: 7 (ref 5–15)
BUN: 15 mg/dL (ref 6–20)
CO2: 24 mmol/L (ref 22–32)
Calcium: 8.3 mg/dL — ABNORMAL LOW (ref 8.9–10.3)
Chloride: 108 mmol/L (ref 101–111)
Creatinine, Ser: 1.27 mg/dL — ABNORMAL HIGH (ref 0.61–1.24)
GFR calc Af Amer: >60 mL/min
GFR calc non Af Amer: 55 mL/min — ABNORMAL LOW
Glucose, Bld: 99 mg/dL (ref 65–99)
Potassium: 4.2 mmol/L (ref 3.5–5.1)
Sodium: 139 mmol/L (ref 135–145)

## 2016-10-25 LAB — CBC
HEMATOCRIT: 31.3 % — AB (ref 39.0–52.0)
HEMOGLOBIN: 10.4 g/dL — AB (ref 13.0–17.0)
MCH: 29.5 pg (ref 26.0–34.0)
MCHC: 33.2 g/dL (ref 30.0–36.0)
MCV: 88.9 fL (ref 78.0–100.0)
Platelets: 268 10*3/uL (ref 150–400)
RBC: 3.52 MIL/uL — ABNORMAL LOW (ref 4.22–5.81)
RDW: 16.7 % — ABNORMAL HIGH (ref 11.5–15.5)
WBC: 10.2 10*3/uL (ref 4.0–10.5)

## 2016-10-25 MED ORDER — POLYETHYLENE GLYCOL 3350 17 G PO PACK: 17.0000 g | PACK | Freq: Every day | ORAL | 0 refills | Status: DC | PRN

## 2016-10-25 MED ORDER — ACETAMINOPHEN 500 MG PO TABS
500.0000 mg | ORAL_TABLET | Freq: Four times a day (QID) | ORAL | 0 refills | Status: DC | PRN
Start: 2016-10-25 — End: 2018-04-29

## 2016-10-25 NOTE — Clinical Social Work Placement (Signed)
   CLINICAL SOCIAL WORK PLACEMENT  NOTE  Date:  10/25/2016  Patient Details  Name: Mark [name] MRN: [id number] Date of Birth: 19-Dec-1945  Clinical Social Work is seeking post-discharge placement for this patient at the [place] level of care (*CSW will initial, date and re-position this form in  chart as items are completed):  Yes   Patient/family provided with [facility] Work Department's list of facilities offering this level of care within the geographic area requested by the patient (or if unable, by the patient's family).  Yes   Patient/family informed of their freedom to choose among providers that offer the needed level of care, that participate in Medicare, Medicaid or managed care program needed by the patient, have an available bed and are willing to accept the patient.  Yes   Patient/family informed of [facility]'s ownership interest in [facility] and [facility], as well as of the fact that they are under no obligation to receive care at these facilities.  PASRR submitted to EDS on       PASRR number received on       Existing PASRR number confirmed on 10/25/16     FL2 transmitted to all facilities in geographic area requested by pt/family on 10/25/16     FL2 transmitted to all facilities within larger geographic area on       Patient informed that his/her managed care company has contracts with or will negotiate with certain facilities, including the following:        Yes   Patient/family informed of bed offers received.  Patient chooses bed at       Physician recommends and patient chooses bed at      Patient to be transferred to   on  .  Patient to be transferred to facility by       Patient family notified on   of transfer.  Name of family member notified:        PHYSICIAN       Additional Comment:    _______________________________________________ Loraine Maple  [phone] 10/25/2016, 12:33  PM

## 2016-10-25 NOTE — Care Management Important Message (Signed)
Important Message  Patient Details  Name: BRADLY SANGIOVANNI MRN: [id number] Date of Birth: [birth year]   Medicare Important Message Given:  Yes    Kerin Salen 10/25/2016, 12:32 [place] Message  Patient Details  Name: LEOR WHYTE MRN: [id number] Date of Birth: Sep 25, 1945   Medicare Important Message Given:  Yes    Kerin Salen 10/25/2016, 12:32 PM

## 2016-10-25 NOTE — Discharge Summary (Addendum)
Physician Discharge Summary  Mark [name]:[id number] DOB: 11/17/1945 DOA: 10/21/2016  PCP: Harlan Stains, MD  Admit date: 10/21/2016 Discharge date: 10/26/2016  Time spent: 35 minutes  Recommendations for Outpatient Follow-up:  1. PCP in 1 week 2. Incidentally noted pulmonary nodule needs Follow up CT chest in 5months   Discharge Diagnoses:    SIRS   Syncope   Severe Orthostatic hypotension   Recent Acute DVT R leg   Recent R leg bakers cyst rupture and hematoma   Immune thrombocytopenic purpura (HCC)   Apnea, sleep   Spastic hemiplegia (HCC)   Lupus anticoagulant disorder (HCC)   AKI (acute kidney injury) ([place])   Acute hypoxemic respiratory failure (HCC)   Sepsis ([place])   Pulmonary nodule   Discharge Condition: stable  Diet recommendation: heart healthy  Filed Weights   10/21/16 1307 10/22/16 1931  Weight: 93 kg (205 lb) 96.2 kg (212 lb 1.3 oz)    History of present illness:  Pt. with PMH of acute DVT on Xarelto, recent Baker's cyst rupture, COPD, chronic ITP, seizures, BPH; admitted on 10/21/2016, presented with complaint of nausea vomiting and passing out episode, was found to have severe orthostatic hypotension.  Hospital Course:  1. Syncope/Orthostatic hypotension -due to dehydration/SIRS and patient is also on tamsulosin as well as bethanechol. -seizure not suspected -stopped bethanechol. Continue flomax for now -repeat Orthostatics improved and hence continued flomax due to severe BPH  2. Recent DVT. Factor V Leyden deficiency and lupus anticoagulant/Chronic ITP. -Continue on Xarelto, warfarin stopped last admit CT PE negative for pulmonary embolism.  3. SIRS. -presented with temp of 102, leukocytosis on admission meeting the criteria for SIRS. -not felt to have any clinical evidence of active infection at this time -WBC and fever improved, ? Viral, recent steroid use also contributing -resolved and remained stable off Abx  4. BPH. Continue  Flomax, discontinued bethanechol. -no issues now  6. Acute kidney injury. -creatinine improved with hydration to 1.2  7. Recent Ruptured Baker's cyst. -with extensive inflammation and reactive changes and hematoma -no evidence of active infection at this time -seen by ortho last admit, MRI done then noted Large, complex/hemorrhagic, leaking/ruptured Baker's cyst -supportive Rx recommended then by Ortho -improving now, ecchymotic changes improving  8. History of seizure disorder. Patient is on 1500 mg Keppra twice a day. -stable  9. Suspected gastritis. On Pepcid 40 mg twice a day.   Discharge Exam: Vitals:   10/25/16 2123 10/26/16 0501  BP: 120/64 115/70  Pulse: 71 72  Resp: 18 15  Temp: 98.5 F (36.9 C) 98.8 F (37.1 C)    General: AAOx3 Cardiovascular: S1S2/RRR Respiratory: CTAB  Discharge Instructions   Discharge Instructions    Diet - low sodium heart healthy    Complete by:  As directed    Diet - low sodium heart healthy    Complete by:  As directed    Increase activity slowly    Complete by:  As directed    Increase activity slowly    Complete by:  As directed      Current Discharge Medication List    START taking these medications   Details  polyethylene glycol (MIRALAX / GLYCOLAX) packet Take 17 g by mouth daily as needed. Qty: 14 each, Refills: 0      CONTINUE these medications which have CHANGED   Details  acetaminophen (TYLENOL) 500 MG tablet Take 1 tablet (500 mg total) by mouth every 6 (six) hours as needed (pain). Qty: 30 tablet, Refills:  0   Associated Diagnoses: Idiopathic thrombocytopenic purpura ([place])      CONTINUE these medications which have NOT CHANGED   Details  buPROPion (WELLBUTRIN XL) 150 MG 24 hr tablet Take 150 mg by mouth daily.    Associated Diagnoses: ITP (idiopathic thrombocytopenic purpura)    Cholecalciferol (VITAMIN D3) 1000 units CAPS Take 2,000 Units by mouth 2 (two) times daily after a meal.     CRESTOR  20 MG tablet Take 20 mg by mouth at bedtime.    Associated Diagnoses: ITP (idiopathic thrombocytopenic purpura)    famotidine (PEPCID) 40 MG tablet Take 40 mg by mouth 2 (two) times daily.     fluticasone (FLONASE) 50 MCG/ACT nasal spray Place 2 sprays into both nostrils daily as needed for allergies or rhinitis.    Associated Diagnoses: ITP (idiopathic thrombocytopenic purpura)    levETIRAcetam (KEPPRA) 500 MG tablet Take 1,500 mg by mouth 2 (two) times daily. 3 tabs  in am and 3 tabs in pm    Multiple Vitamins-Minerals (MULTIVITAMIN WITH MINERALS) tablet Take 1 tablet by mouth 2 (two) times daily.    Associated Diagnoses: ITP (idiopathic thrombocytopenic purpura)    rivaroxaban (XARELTO) 20 MG TABS tablet Take 1 tablet (20 mg total) by mouth daily with breakfast. Qty: 30 tablet, Refills: 1   Associated Diagnoses: DVT (deep venous thrombosis) (HCC); LA (lupus anticoagulant) disorder (HCC)    sertraline (ZOLOFT) 100 MG tablet Take 200 mg by mouth daily.    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    Associated Diagnoses: CVA (cerebral infarction)    alendronate (FOSAMAX) 70 MG tablet Take 70 mg by mouth every Sunday. Take with a full glass of water on an empty stomach.    Associated Diagnoses: CVA (cerebral infarction)    aspirin 81 MG chewable tablet Chew 1 tablet (81 mg total) by mouth at bedtime.    traMADol (ULTRAM) 50 MG tablet Take 50 mg by mouth every 6 (six) hours as needed (pain).      STOP taking these medications     amLODipine (NORVASC) 2.5 MG tablet      bethanechol (URECHOLINE) 25 MG tablet      NYSTATIN, TOPICAL, POWD      dexamethasone (DECADRON) 4 MG tablet      diphenhydrAMINE (BENADRYL) 25 MG tablet        No Known Allergies  Contact information for follow-up providers    Harlan Stains, MD Follow up.   Specialty:  Family  Medicine Contact information: [place] [address] [phone]        [facility] Follow up.   Specialty:  Home Health Services Why:  [facility] nursing,physical/occupational therapy,aide,social worker Contact information: [address] [place] [address] [phone]            Contact information for after-discharge care    Destination    [facility] .   Specialty:  [place] information: [address] [place] [phone]                   The results of significant diagnostics from this hospitalization (including imaging, microbiology, ancillary and laboratory) are listed below for reference.    Significant Diagnostic Studies: Dg Chest 2 View  Result Date: 10/12/2016 CLINICAL DATA:  Cough and  fever. EXAM: CHEST  2 VIEW COMPARISON:  None. FINDINGS: The heart size and mediastinal contours are within normal limits. Both lungs are clear. No evidence of pleural effusion. Several old right rib fracture deformities again noted. Old lower thoracic vertebral body compression fractures with previous vertebroplasties again noted . IMPRESSION: No active cardiopulmonary disease. Electronically Signed   By: Earle Gell M.D.   On: 10/12/2016 12:21   Dg Chest 2 View  Result Date: 10/10/2016 CLINICAL DATA:  Chronic cough after eating. EXAM: CHEST  2 VIEW COMPARISON:  11/16/2015 FINDINGS: Heart size is normal. There is aortic atherosclerosis. The lungs are clear. No evidence of aspiration pneumonia. No effusions. Old augmented lower thoracic compression fractures again noted. IMPRESSION: No active disease.  No aspiration pneumonia. Electronically Signed   By: Nelson Chimes M.D.   On: 10/10/2016 13:55   Ct Head Wo Contrast  Result Date: 10/21/2016 CLINICAL DATA:  Pt comes from Dr. Dicie Beam office. Pt was there for a routine appt and per family become  diaphoretic, vomited, and passed out. Pt is pale, diaphoretic and O2 sats are in the 80s on room air. Pt does not wear O2 at home. EXAM: CT HEAD WITHOUT CONTRAST TECHNIQUE: Contiguous axial images were obtained from the base of the skull through the vertex without intravenous contrast. COMPARISON:  MR 09/01/2015 and previous FINDINGS: Brain: Chronic right parietal encephalomalacia. Extensive white matter hypointensity throughout the right cerebellar hemisphere as before. Diffuse parenchymal atrophy with resultant prominence of ventricles sulci. Negative for acute intracranial hemorrhage, midline shift, mass, or mass-effect. Acute infarct may be inapparent on noncontrast CT. Vascular: Atherosclerotic and physiologic intracranial calcifications. Skull: Normal. Negative for fracture or focal lesion. Sinuses/Orbits: No acute finding. Other: None. IMPRESSION: 1. Negative for bleed or other acute intracranial process. 2. Old right MCA distribution infarct. Electronically Signed   By: Lucrezia Europe M.D.   On: 10/21/2016 14:45   Ct Angio Chest Pe W And/or Wo Contrast  Result Date: 10/21/2016 CLINICAL DATA:  Shortness of breath, recent DVT. EXAM: CT ANGIOGRAPHY CHEST WITH CONTRAST TECHNIQUE: Multidetector CT imaging of the chest was performed using the standard protocol during bolus administration of intravenous contrast. Multiplanar CT image reconstructions and MIPs were obtained to evaluate the vascular anatomy. CONTRAST:  100 cc Isovue 370 COMPARISON:  None. FINDINGS: Cardiovascular: The heart is normal in size. No pericardial effusion. The aorta is normal in caliber. No significant atherosclerotic calcifications. Coronary artery calcifications are noted. The pulmonary arterial tree is fairly well opacified. No definite filling defects to suggest pulmonary embolism. Mediastinum/Nodes: Small scattered mediastinal and hilar lymph nodes but no mass or adenopathy. The esophagus is grossly normal. Lungs/Pleura: No acute  pulmonary findings. No infiltrates, edema or effusions. 6.5 mm right middle lobe pulmonary nodule along the minor fissure is most likely a lymph node. No other pulmonary nodules are identified. Basilar scarring changes are noted. No bronchiectasis or interstitial lung disease. Upper Abdomen: No significant upper abdominal findings. 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 The gallbladder surgically absent. The upper abdominal aorta is normal in caliber. Chest wall/ Musculoskeletal: No chest wall mass, supraclavicular or axillary lymphadenopathy. The thyroid gland appears normal. Remote healed rib fractures are noted. There are also vertebral augmentation changes noted at T11 and T12. Review of the MIP images confirms the above findings. IMPRESSION: 1. No CT findings for pulmonary embolism. 2. No aortic aneurysm or dissection. 3. No acute pulmonary findings. 4. 6.5 mm right middle lobe pulmonary nodule along the minor fissure is likely a  lymph node. Non-contrast chest CT at 6-12 months is recommended. If the nodule is stable at time of repeat CT, then future CT at 18-24 months (from today's scan) is considered optional for low-risk patients, but is recommended for high-risk patients. This recommendation follows the consensus statement: Guidelines for Management of Incidental Pulmonary Nodules Detected on CT Images: From the Fleischner Society 2017; Radiology 2017; 284:228-243. Aortic Atherosclerosis (ICD10-I70.0). Electronically Signed   By: Marijo Sanes M.D.   On: 10/21/2016 13:40   Dg Op Swallowing Func-medicare/speech Path  Result Date: 10/10/2016 Objective Swallowing Evaluation: Type of Study: MBS-Modified Barium Swallow Study Patient Details Name: ROGAN WIGLEY MRN: [id number] Date of Birth: [birth year] Today's Date: 10/10/2016 Time: SLP Start Time (ACUTE ONLY): 1125-SLP Stop Time (ACUTE ONLY): 1150 SLP Time Calculation (min) (ACUTE ONLY): 25 min Past Medical History: No past medical history on file.  Past Surgical History: No past surgical history on file. 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 Additional PMH refractory immune thrombocytopenia, lupus anticoagulant disorder, DVT, sleep apnea, dysphonia. Pt comes in for outpatient MBS with symptoms including coughing during but mostly after meals, eructation, difficulty swallowing pills and pharyngeal globus sensation.  No Data Recorded Assessment / Plan / Recommendation CHL IP CLINICAL IMPRESSIONS 10/10/2016 Clinical Impression Pt exhibits a mild sensorimotor oropharyngeal dysphagia marked by mild and intermittent premature spill due to decreased oral control and cohesion. Sensory deficits impacted initiation of swallow evidenced by intermittent delay to pyriform sinuses. Laryngeal motor weakness resulted in mildly decreased laryngeal elevation and epiglottic inversion with penetration unsensed but effective throat clearing with cues for. No laryneal intrusion while performing a chin tuck over repeated trials. Unremarkable oropharyngeal and esophageal (MBS does not diagnose esophageal deficits below UES) transit with pill whole in applesauce. No difficulty with multi-consistency texture although he is at greater risk given mild oral deficits. Recommend regular texture (use caution with dry, crumbly food, multi-consistency), thin liquids using chin tuck strategy, pills whole in applesauce. Did not disuss with pt however if symptoms are not improved by compensatory strategies and dysphagia worsens, he may benefit from outpatient ST for motor strengthening.         SLP Visit Diagnosis Dysphagia, oropharyngeal phase (R13.12) Attention and concentration deficit following -- Frontal lobe and executive function deficit following -- Impact on safety and function (No Data)   CHL IP TREATMENT RECOMMENDATION 10/10/2016 Treatment Recommendations No treatment recommended at this time   No flowsheet data found.  CHL IP DIET RECOMMENDATION 10/10/2016 SLP Diet Recommendations Regular solids;Thin liquid Liquid Administration via Cup Medication Administration Whole meds with puree Compensations Slow rate;Small sips/bites;Chin tuck;Follow solids with liquid Postural Changes Seated upright at 90 degrees;Remain semi-upright after after feeds/meals (Comment)   CHL IP OTHER RECOMMENDATIONS 10/10/2016 Recommended Consults -- Oral Care Recommendations Oral care BID Other Recommendations --   CHL IP FOLLOW UP RECOMMENDATIONS 10/10/2016 Follow up Recommendations None   No flowsheet data found.     CHL IP ORAL PHASE 10/10/2016 Oral Phase Impaired Oral - Pudding Teaspoon -- Oral - Pudding Cup -- Oral - Honey Teaspoon -- Oral - Honey Cup -- Oral - Nectar Teaspoon -- Oral - Nectar Cup -- Oral - Nectar Straw -- Oral - Thin Teaspoon -- Oral - Thin Cup Premature spillage Oral - Thin Straw -- Oral - Puree -- Oral - Mech Soft -- Oral - Regular -- Oral - Multi-Consistency -- Oral - Pill -- Oral Phase - Comment --  CHL  IP PHARYNGEAL PHASE 10/10/2016 Pharyngeal Phase Impaired Pharyngeal- Pudding Teaspoon -- Pharyngeal -- Pharyngeal- Pudding Cup -- Pharyngeal -- Pharyngeal- Honey Teaspoon -- Pharyngeal -- Pharyngeal- Honey Cup -- Pharyngeal -- Pharyngeal- Nectar Teaspoon -- Pharyngeal -- Pharyngeal- Nectar Cup -- Pharyngeal -- Pharyngeal- Nectar Straw -- Pharyngeal -- Pharyngeal- Thin Teaspoon -- Pharyngeal -- Pharyngeal- Thin Cup Delayed swallow initiation-pyriform sinuses;Reduced laryngeal elevation;Reduced airway/laryngeal closure;Penetration/Aspiration during swallow Pharyngeal Material enters airway, remains ABOVE vocal cords and not ejected out Pharyngeal- Thin Straw -- Pharyngeal -- Pharyngeal- Puree -- Pharyngeal -- Pharyngeal- Mechanical Soft -- Pharyngeal -- Pharyngeal- Regular WFL Pharyngeal -- Pharyngeal- Multi-consistency WFL Pharyngeal -- Pharyngeal- Pill WFL Pharyngeal -- Pharyngeal Comment --  CHL IP CERVICAL ESOPHAGEAL PHASE 10/10/2016  Cervical Esophageal Phase WFL Pudding Teaspoon -- Pudding Cup -- Honey Teaspoon -- Honey Cup -- Nectar Teaspoon -- Nectar Cup -- Nectar Straw -- Thin Teaspoon -- Thin Cup -- Thin Straw -- Puree -- Mechanical Soft -- Regular -- Multi-consistency -- Pill -- Cervical Esophageal Comment -- CHL IP GO 10/10/2016 Functional Assessment Tool Used skilled clinical judgement Functional Limitations Swallowing Swallow Current Status (F0932) CJ Swallow Goal Status (T5573) CJ Swallow Discharge Status (U2025) CJ Motor Speech Current Status (K2706) (None) Motor Speech Goal Status (C3762) (None) Motor Speech Goal Status (G3151) (None) Spoken Language Comprehension Current Status (V6160) (None) Spoken Language Comprehension Goal Status (V3710) (None) Spoken Language Comprehension Discharge Status (G2694) (None) Spoken Language Expression Current Status (W5462) (None) Spoken Language Expression Goal Status (V0350) (None) Spoken Language Expression Discharge Status (K9381) (None) Attention Current Status (W2993) (None) Attention Goal Status (Z1696) (None) Attention Discharge Status (V8938) (None) Memory Current Status (B0175) (None) Memory Goal Status (Z0258) (None) Memory Discharge Status (N2778) (None) Voice Current Status (E4235) (None) Voice Goal Status (T6144) (None) Voice Discharge Status (R1540) (None) Other Speech-Language Pathology Functional Limitation Current Status (G8676) (None) Other Speech-Language Pathology Functional Limitation Goal Status (P9509) (None) Other Speech-Language Pathology Functional Limitation Discharge Status [phone]) (None) Houston Siren 10/10/2016, 4:17 PM .Orbie Pyo Litaker M.Ed CCC-SLP Pager [phone]            CLINICAL DATA:  Coughing with meals. Prior stroke. Globus sensation. EXAM: MODIFIED BARIUM SWALLOW TECHNIQUE: Different consistencies of barium were administered orally to the patient by the Speech Pathologist. Imaging of the pharynx was performed in the lateral projection. FLUOROSCOPY  TIME:  Fluoroscopy Time:  1 minutes, 40 seconds Number of Acquired Spot Images: 0 COMPARISON:  None. FINDINGS: Thin liquid- single episode of flash penetration. This did not occur in the chin-tuck position. Early spillover with several swallows. Pure with cracker- within normal limits Barium tablet -  within normal limits IMPRESSION: 1. Single episode of flash penetration with thin liquids. Early spillover with several swallows of thin liquids. Otherwise normal exam. Please refer to the Speech Pathologists report for complete details and recommendations. Electronically Signed   By: Van Clines M.D.   On: 10/10/2016 12:12   Mr Tibia Fibula Right W Wo Contrast  Result Date: 10/14/2016 CLINICAL DATA:  Pain, swelling and ecchymosis in the right calf. Large complex fluid collection noted on recent ultrasound. EXAM: MRI OF LOWER RIGHT EXTREMITY WITHOUT AND WITH CONTRAST TECHNIQUE: Multiplanar, multisequence MR imaging of the right lower extremity was performed both before and after administration of intravenous contrast. CONTRAST:  13mL MULTIHANCE GADOBENATE DIMEGLUMINE 529 MG/ML IV SOLN COMPARISON:  Ultrasound 10/12/2016 FINDINGS: As demonstrated on the ultrasound examination is ache large complex fluid collection extending from the popliteal space down along the posterior and medial aspect of the  calf all the way down to the ankle. This has mixed T1 and T2 signal intensity with areas of increased T1 signal intensity consistent with hemorrhage. No worrisome rim enhancement to suggest abscess. Findings are most consistent with a complex/ hemorrhagic leaking/ruptured Baker's cyst, particularly with history of anti coagulation. Fairly extensive areas of fatty change involving the calf musculature bilaterally most likely due to denervation injuries or prior diabetic muscle infarcts. Moderate right and small left knee joint effusions. No MR findings to suggest septic arthritis or osteomyelitis. IMPRESSION: Large,  complex/hemorrhagic, leaking/ruptured Baker's cyst. No mass or evidence of abscess. Diffuse fatty change involving calf muscles bilaterally likely denervation process or remote diabetic muscle infarct. Bilateral knee joint effusions, right greater than left. No MR findings to suggest septic arthritis or osteomyelitis. Electronically Signed   By: Marijo Sanes M.D.   On: 10/14/2016 09:31   US Venous Img Lower Unilateral Right  Result Date: 10/12/2016 CLINICAL DATA:  Right leg pain and swelling EXAM: RIGHT LOWER EXTREMITY VENOUS DOPPLER ULTRASOUND TECHNIQUE: Gray-scale sonography with graded compression, as well as color Doppler and duplex ultrasound were performed to evaluate the lower extremity deep venous systems from the level of the common femoral vein and including the common femoral, femoral, profunda femoral, popliteal and calf veins including the posterior tibial, peroneal and gastrocnemius veins when visible. The superficial great saphenous vein was also interrogated. Spectral Doppler was utilized to evaluate flow at rest and with distal augmentation maneuvers in the common femoral, femoral and popliteal veins. COMPARISON:  None. FINDINGS: Contralateral Common Femoral Vein: Respiratory phasicity is normal and symmetric with the symptomatic side. No evidence of thrombus. Normal compressibility. Common Femoral Vein: No evidence of thrombus. Normal compressibility, respiratory phasicity and response to augmentation. Saphenofemoral Junction: No evidence of thrombus. Normal compressibility and flow on color Doppler imaging. Profunda Femoral Vein: No evidence of thrombus. Normal compressibility and flow on color Doppler imaging. Femoral Vein: Linear echogenic material is present within the femoral vein throughout the thigh. The vessel remains largely compressible and there is color flow on color Doppler imaging. Findings may represent sequelae of remote or recanalized DVT. Popliteal Vein: The popliteal vein is  not compressible but rather expanded with low-level internal echoes. No significant flow on color Doppler imaging. Findings are consistent with acute thrombus. Calf Veins: No evidence of thrombus. Normal compressibility and flow on color Doppler imaging. Superficial Great Saphenous Vein: No evidence of thrombus. Normal compressibility and flow on color Doppler imaging. Venous Reflux:  None. Other Findings: Large complex fluid collection originating in the popliteal space and extending posteriorly along the entirety of the calf to the level of the ankle. IMPRESSION: 1. Positive for acute occlusive deep venous thrombosis in the popliteal vein. 2. Linear nonocclusive echogenic foci throughout the femoral vein in the thigh may represent the sequelae of a prior and now recanalized DVT. 3. Large complex fluid collection in the subcutaneous soft tissues beginning in the popliteal fossa and tracking inferiorly along the posterior calf to the level of the Achilles tendon. Differential considerations include hematoma, perhaps due to partial gastrocnemius tear, partial or complete tear of the Achilles tendon with superior retraction of the muscle, and potentially a complex ruptured Baker's cyst. Electronically Signed   By: Jacqulynn Cadet M.D.   On: 10/12/2016 13:24   Dg Abd Portable 1v  Result Date: 10/21/2016 CLINICAL DATA:  Nausea and vomiting. EXAM: PORTABLE ABDOMEN - 1 VIEW COMPARISON:  Included upper abdomen from chest CTA earlier this day. Abdominal CT 08/27/2007 FINDINGS:  Mild gaseous gastric distention. No small bowel dilatation. Moderate to large colonic stool burden. Surgical clips in the right upper quadrant from appendectomy. No evidence of free air. Lab thoracic spine compression fractures with augmentation. IMPRESSION: 1. Moderate to large colonic stool burden. 2. No bowel obstruction.  Mild gaseous gastric distention. Electronically Signed   By: Jeb Levering M.D.   On: 10/21/2016 21:32     Microbiology: Recent Results (from the past 240 hour(s))  Culture, blood (Routine x 2)     Status: None (Preliminary result)   Collection Time: 10/21/16 12:20 PM  Result Value Ref Range Status   Specimen Description BLOOD RIGHT ARM  Final   Special Requests   Final    BOTTLES DRAWN AEROBIC AND ANAEROBIC Blood Culture results may not be optimal due to an excessive volume of blood received in culture bottles   Culture   Final    NO GROWTH 4 DAYS Performed at [facility], [place] [address], [place], [place] [address]    Report Status PENDING  Incomplete  Culture, blood (Routine x 2)     Status: None (Preliminary result)   Collection Time: 10/21/16 12:30 PM  Result Value Ref Range Status   Specimen Description BLOOD RIGHT ARM  Final   Special Requests   Final    BOTTLES DRAWN AEROBIC ONLY Blood Culture results may not be optimal due to an excessive volume of blood received in culture bottles   Culture   Final    NO GROWTH 4 DAYS Performed at [facility], [place] [address] [place], [place] [address]    Report Status PENDING  Incomplete  Urine culture     Status: None   Collection Time: 10/21/16  1:54 PM  Result Value Ref Range Status   Specimen Description URINE, CATHETERIZED  Final   Special Requests NONE  Final   Culture   Final    NO GROWTH Performed at [facility], [address], [place], [place] [address]    Report Status 10/22/2016 FINAL  Final  MRSA PCR Screening     Status: None   Collection Time: 10/21/16  5:04 PM  Result Value Ref Range Status   MRSA by PCR NEGATIVE NEGATIVE Final    Comment:        The GeneXpert MRSA Assay (FDA approved for NASAL specimens only), is one component of a comprehensive MRSA colonization surveillance program. It is not intended to diagnose MRSA infection nor to guide or monitor treatment for MRSA infections.      Labs: Basic Metabolic Panel:  Recent Labs Lab 10/21/16 1228 10/22/16 0332 10/23/16 0510  10/24/16 0417 10/25/16 0448 10/26/16 0519  NA 135 134* 138 140 139  --   K 4.0 4.0 4.2 4.0 4.2  --   CL 101 107 109 110 108  --   CO2 22 21* 23 23 24   --   GLUCOSE 182* 135* 110* 109* 99  --   BUN 54* 40* 28* 20 15  --   CREATININE 1.75* 1.39* 1.46* 1.34*  1.34* 1.27* 1.36*  CALCIUM 9.2 7.8* 8.0* 8.2* 8.3*  --   MG  --   --   --  2.0  --   --    Liver Function Tests:  Recent Labs Lab 10/21/16 1228 10/22/16 0332 10/23/16 0510 10/24/16 0417  AST 36 25 21 28   ALT 97* 60 45 47  ALKPHOS 61 43 43 42  BILITOT 1.0 1.0 0.7 0.6  PROT 7.3 5.4* 5.6* 5.7*  ALBUMIN 4.1 3.1* 3.0* 3.0*   No results for input(s): LIPASE, AMYLASE in the last 168 hours. No results for input(s): AMMONIA in the last 168 hours. CBC:  Recent Labs Lab 10/21/16 1228 10/22/16 0332 10/23/16 0510 10/24/16 0417 10/25/16 0448  WBC 18.7* 26.1* 16.3* 10.9* 10.2  NEUTROABS 9.6*  --  9.8* 5.4  --   HGB 13.7 10.3* 9.8* 9.7* 10.4*  HCT 40.8 30.7* 29.1* 29.3* 31.3*  MCV 92.1 90.6 90.9 91.0 88.9  PLT 348 259 262 266 268   Cardiac Enzymes:  Recent Labs Lab 10/21/16 1228  TROPONINI <0.03   BNP: BNP (last 3 results) No results for input(s): BNP in the last 8760 hours.  ProBNP (last 3 results) No results for input(s): PROBNP in the last 8760 hours.  CBG:  Recent Labs Lab 10/21/16 1225  GLUCAP 158*       SignedDomenic Polite MD.  [facility] 10/26/2016, 8:08 AM

## 2016-10-25 NOTE — Progress Notes (Signed)
Assumed care from previous RN. Agree with earlier assessment. Condition unchanged.Eulas Post, RN

## 2016-10-25 NOTE — NC FL2 (Signed)
[place] LEVEL OF CARE SCREENING TOOL     IDENTIFICATION  Patient Name: Mark [name] Birthdate: [birth year] Sex: male Admission Date (Current Location): 10/21/2016  [facility] and [place] Number:  Herbalist and Address:  [facility],  [place] [address], [place]      Provider Number: [id number]  Attending Physician Name and Address:  Domenic Polite, MD  Relative Name and Phone Number:       Current Level of Care: Hospital Recommended Level of Care: [place] Prior Approval Number:    Date Approved/Denied:   PASRR Number: [id number]  Discharge Plan: SNF    Current Diagnoses: Patient Active Problem List   Diagnosis Date Noted  . Pulmonary nodule 10/24/2016  . Acute hypoxemic respiratory failure ([place]) 10/21/2016  . Sepsis ([place]) 10/21/2016  . DVT (deep venous thrombosis) ([place]) 10/12/2016  . Supratherapeutic INR 10/12/2016  . Acute deep vein thrombosis (DVT) of popliteal vein of right lower extremity ([place]) 10/12/2016  . AKI (acute kidney injury) ([place]) 10/12/2016  . Fever 10/12/2016  . Leukocytosis 10/12/2016  . Bacteriuria 10/12/2016  . Deep vein thrombosis (DVT) ([place]) 03/02/2015  . 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 Ischemic stroke ([place]) 10/04/2014  . 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 Deep vein thrombosis ([place]) 10/04/2014  . 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 LA (lupus anticoagulant) disorder ([place]) 10/04/2014  . Arteriosclerosis of coronary artery 09/10/2012  . Apnea, sleep 09/10/2012  . 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 Dermatophytic onychia 01/29/2012  . Spastic hemiplegia ([place]) 12/03/2011  . Hemiparesis, left ([place]) 10/31/2011  . Dysphonia 08/13/2011  . 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 ITP (idiopathic thrombocytopenic purpura) 08/12/2011    Orientation RESPIRATION BLADDER Height & Weight     Self, Time, Situation, Place    Continent  Weight: 212 lb 1.3 oz (96.2 kg) Height:  6' (182.9 cm)  BEHAVIORAL SYMPTOMS/MOOD NEUROLOGICAL BOWEL NUTRITION STATUS  Other (Comment) (no behaviors) Convulsions/Seizures Continent Diet  AMBULATORY STATUS COMMUNICATION OF NEEDS Skin   Extensive Assist Verbally Normal                       Personal Care Assistance Level of Assistance  Bathing, Feeding, Dressing Bathing Assistance: Limited assistance Feeding assistance: Independent Dressing Assistance: Limited assistance     Functional Limitations Info  Sight, Hearing, Speech Sight Info: Adequate Hearing Info: Impaired Speech Info: Adequate    SPECIAL CARE FACTORS FREQUENCY  PT (By licensed PT), OT (By licensed OT)     PT Frequency: 5x wk OT Frequency: 5x wk            Contractures Contractures Info: Not present    Additional Factors Info  Code Status Code Status Info: Full Code             Current Medications (10/25/2016):  This is the current hospital active medication list Current Facility-Administered Medications  Medication Dose Route Frequency Provider Last Rate Last Dose  . acetaminophen (TYLENOL) tablet 500 mg  500 mg Oral Q6H PRN Nita Sells, MD      . aspirin chewable tablet 81 mg  81 mg Oral QHS Nita Sells, MD   81 mg at 10/24/16 2115  . buPROPion (WELLBUTRIN XL) 24 hr tablet 150 mg  150 mg Oral Daily Donne Hazel, MD   150 mg at 10/24/16 1049  . fluticasone (FLONASE) 50 MCG/ACT nasal spray 2 spray  2 spray Each Nare Daily PRN Wyline Copas,  Orpah Melter, MD      . levETIRAcetam (KEPPRA) tablet 1,500 mg  1,500 mg Oral BID Donne Hazel, MD   1,500 mg at 10/24/16 2115  . MEDLINE mouth rinse  15 mL Mouth Rinse BID Lavina Hamman, MD   15 mL at 10/24/16 2115  . pantoprazole (PROTONIX) EC tablet 40 mg  40 mg Oral Daily Lavina Hamman, MD   40 mg at 10/24/16 1050  . polyethylene glycol (MIRALAX / GLYCOLAX) packet 17 g  17 g Oral Daily Lavina Hamman, MD   Stopped at 10/24/16 1050  .  rivaroxaban (XARELTO) tablet 20 mg  20 mg Oral Q breakfast Donne Hazel, MD   20 mg at 10/24/16 1050  . senna-docusate (Senokot-S) tablet 1 tablet  1 tablet Oral BID Lavina Hamman, MD   1 tablet at 10/24/16 2114  . sertraline (ZOLOFT) tablet 200 mg  200 mg Oral Daily Donne Hazel, MD   200 mg at 10/24/16 1051  . tamsulosin (FLOMAX) capsule 0.8 mg  0.8 mg Oral QHS Nita Sells, MD   0.8 mg at 10/24/16 2114     Discharge Medications: Please see discharge summary for a list of discharge medications.  Relevant Imaging Results:  Relevant Lab Results:   Additional Information SS # [id number] . Pt has upcoming chemo appts at [facility] ( 7/18, 8/8, 8/29, 9/19,10/10.)  Dorethy Tomey, Randall An, LCSW

## 2016-10-25 NOTE — Progress Notes (Signed)
CSW consulted for SNF placement. CSW met with pt / spouse at bedside to assist with d/c planning. PT has recommended SNF at d/c. Pt / spouse are in agreement with this plan. SNF search initiated and bed offers provided to pt / spouse. Pasrr completed. Pt's daughter is touring facilities this afternoon and will contact CSW with SNF choice. Once SNF choice is made [facility] authorization will be requested by SNF. It can take 2 or more days for an authorization decision to be made. Insurance is closed over the weekend. Pt is unable to pay for SNF placement while waiting for authorization. Nathaniel Man, Surveyor, quantity of CSW contacted to review case. [facility] is unable to provide Letter of Guarantee at this time. Pt will plan to dc home with [facility] services arranged by [facility] and will be placed from home.  Werner Lean LCSW [phone]

## 2016-10-25 NOTE — Progress Notes (Signed)
Physical Therapy Treatment Patient Details Name: Mark [name] MRN: [id number] DOB: 21-Sep-1945 Today's Date: 10/25/2016    History of Present Illness Mark [name] is a 71 y.o. male with medical history significant of recent acute DVT on therapeutic xarelto, hx of ARF, CAD, chronic ITP, seizure disorder, HTN who presents 7/9 for  concerns for possible sepsis with associated syncope. Patient was recently discharged on 7/4 where pt was found to have super therapeutic INR with recommendation to transition to [place] per hematology recs. Patient was also found to have a large, leaking/ruptured baker's cyst which otherwise remained stable. Patient presented to Hematology clinic for f/u where he was noted to have episode of n/v and subsequent syncope    PT Comments    Pt's mobility improving with use of hemiwalker today however still requires at least min assist.  Spouse cannot provide physical assist at home.  Pt and spouse agreeable to post acute rehab to allow pt to become more independent prior to home.    Follow Up Recommendations  SNF;CIR (if not CIR then SNF)     Equipment Recommendations  None recommended by PT    Recommendations for Other Services       Precautions / Restrictions Precautions Precautions: Fall Precaution Comments: L AFO Restrictions Weight Bearing Restrictions: No    Mobility  Bed Mobility Overal bed mobility: Needs Assistance Bed Mobility: Supine to Sit     Supine to sit: Supervision;HOB elevated     General bed mobility comments: pt utilized rail  Transfers Overall transfer level: Needs assistance Equipment used: Hemi-walker Transfers: Sit to/from Stand Sit to Stand: Min assist         General transfer comment: shoes and AFO donned EOB, assist to rise and steady, steadiness improved with use of hemiwalker  Ambulation/Gait Ambulation/Gait assistance: Min assist Ambulation Distance (Feet): 80 Feet Assistive device: Hemi-walker Gait  Pattern/deviations: Step-through pattern     General Gait Details: only slight circumduction of L LE observed today, continued to encourage more knee flexion L LE, pt appears more stable using hemiwalker however did present with more of a lateral lean due to taking more weight through this device   Stairs            Wheelchair Mobility    Modified Rankin (Stroke Patients Only)       Balance                                            Cognition Arousal/Alertness: Awake/alert Behavior During Therapy: WFL for tasks assessed/performed Overall Cognitive Status: Within Functional Limits for tasks assessed                                        Exercises General Exercises - Lower Extremity Ankle Circles/Pumps: AROM;Both;10 reps;Seated Long Arc Quad: AROM;Both;10 reps;Seated Hip Flexion/Marching: AROM;Seated;Both;10 reps    General Comments        Pertinent Vitals/Pain Pain Assessment: No/denies pain Pain Intervention(s): Repositioned    Home Living                      Prior Function            PT Goals (current goals can now be found in the care plan section) Progress towards PT goals: Progressing  toward goals    Frequency    Min 3X/week      PT Plan Current plan remains appropriate    Co-evaluation              AM-PAC PT "6 Clicks" Daily Activity  Outcome Measure  Difficulty turning over in bed (including adjusting bedclothes, sheets and blankets)?: Total Difficulty moving from lying on back to sitting on the side of the bed? : A Little Difficulty sitting down on and standing up from a chair with arms (e.g., wheelchair, bedside commode, etc,.)?: Total Help needed moving to and from a bed to chair (including a wheelchair)?: A Lot Help needed walking in hospital room?: A Lot Help needed climbing 3-5 steps with a railing? : Total 6 Click Score: 10    End of Session Equipment Utilized During  Treatment: Gait belt Activity Tolerance: Patient limited by fatigue Patient left: in chair;with call bell/phone within reach;with family/visitor present   PT Visit Diagnosis: Difficulty in walking, not elsewhere classified (R26.2);Unsteadiness on feet (R26.81)     Time: 7494-4967 PT Time Calculation (min) (ACUTE ONLY): 22 min  Charges:  $Gait Training: 8-22 mins                    G Codes:       Carmelia Bake, PT, DPT 10/25/2016 Pager: [phone]  [place] E 10/25/2016, 12:45 PM

## 2016-10-25 NOTE — Care Management Note (Signed)
Case Management Note  Patient Details  Name: Mark [name] MRN: [id number] Date of Birth: [birth year]  Subjective/Objective: Spoke to spouse in rm about d/c plans-provided w/HHC agency list-chose [facility]-rep La[place]awna accepted, & aware of d/c in am. Provided w/private duty care list as resource(independent choice,out of pocket cost). Ambulance transportation needed @ d/c.                    Action/Plan:d/c home w/HHC.   Expected Discharge Date:                  Expected Discharge Plan:  [place]  In-House Referral:  Clinical Social Work  Discharge planning Services  CM Consult  Post Acute Care Choice:  Durable Medical Equipment (3n1,transfer chair) Choice offered to:  Spouse  DME Arranged:    DME Agency:     HH Arranged:  RN, PT, OT, Nurse's Aide, Social Work [employer] Agency:  [facility]  Status of Service:  In process, will continue to follow  If discussed at Long Length of Stay Meetings, dates discussed:    Additional Comments:  Dessa Phi, RN 10/25/2016, 1:13 PM

## 2016-10-26 LAB — CREATININE, SERUM
Creatinine, Ser: 1.36 mg/dL — ABNORMAL HIGH (ref 0.61–1.24)
GFR calc non Af Amer: 51 mL/min — ABNORMAL LOW (ref >60)
GFR, EST AFRICAN AMERICAN: 59 mL/min — AB (ref >60)

## 2016-10-26 LAB — CULTURE, BLOOD (ROUTINE X 2)
CULTURE: NO GROWTH
CULTURE: NO GROWTH

## 2016-10-26 NOTE — Progress Notes (Signed)
Pt Discharged to home with home health. Wife left with pt. Pt denies pain or discomfort. Left with all belongings. Informed pt spouse that [facility] is aware of pt discharge and will be in contact. Reviewed discharge instructions and medications. Spouse is caregiver and verbalizes understanding. Able to teach back medication instructions. No concerns from pt or spouse at this time,

## 2016-10-26 NOTE — Care Management Note (Signed)
Met with pt and wife. Pt is ready to be D/C and he needs ambulance. [place] and arranged transportation.

## 2016-10-29 DIAGNOSIS — I951 Orthostatic hypotension: Secondary | ICD-10-CM | POA: Diagnosis not present

## 2016-10-29 DIAGNOSIS — N179 Acute kidney failure, unspecified: Secondary | ICD-10-CM | POA: Diagnosis not present

## 2016-10-29 DIAGNOSIS — R6510 Systemic inflammatory response syndrome (SIRS) of non-infectious origin without acute organ dysfunction: Secondary | ICD-10-CM | POA: Diagnosis not present

## 2016-10-29 DIAGNOSIS — I69359 Hemiplegia and hemiparesis following cerebral infarction affecting unspecified side: Secondary | ICD-10-CM | POA: Diagnosis not present

## 2016-10-30 ENCOUNTER — Other Ambulatory Visit: Payer: Self-pay | Admitting: Family

## 2016-10-30 ENCOUNTER — Other Ambulatory Visit (HOSPITAL_BASED_OUTPATIENT_CLINIC_OR_DEPARTMENT_OTHER): Payer: Medicare HMO

## 2016-10-30 DIAGNOSIS — D693 Immune thrombocytopenic purpura: Secondary | ICD-10-CM | POA: Diagnosis not present

## 2016-10-30 LAB — PROTIME-INR (CHCC SATELLITE)
INR: 1.4 — AB (ref 2.0–3.5)
PROTIME: 16.8 s — AB (ref 10.6–13.4)

## 2016-11-01 DIAGNOSIS — Z86718 Personal history of other venous thrombosis and embolism: Secondary | ICD-10-CM | POA: Diagnosis not present

## 2016-11-01 DIAGNOSIS — D6862 Lupus anticoagulant syndrome: Secondary | ICD-10-CM | POA: Diagnosis not present

## 2016-11-01 DIAGNOSIS — R569 Unspecified convulsions: Secondary | ICD-10-CM | POA: Diagnosis not present

## 2016-11-01 DIAGNOSIS — G4733 Obstructive sleep apnea (adult) (pediatric): Secondary | ICD-10-CM | POA: Diagnosis not present

## 2016-11-01 DIAGNOSIS — N400 Enlarged prostate without lower urinary tract symptoms: Secondary | ICD-10-CM | POA: Diagnosis not present

## 2016-11-01 DIAGNOSIS — R6510 Systemic inflammatory response syndrome (SIRS) of non-infectious origin without acute organ dysfunction: Secondary | ICD-10-CM | POA: Diagnosis not present

## 2016-11-01 DIAGNOSIS — I959 Hypotension, unspecified: Secondary | ICD-10-CM | POA: Diagnosis not present

## 2016-11-01 DIAGNOSIS — D72829 Elevated white blood cell count, unspecified: Secondary | ICD-10-CM | POA: Diagnosis not present

## 2016-11-01 DIAGNOSIS — N179 Acute kidney failure, unspecified: Secondary | ICD-10-CM | POA: Diagnosis not present

## 2016-11-01 DIAGNOSIS — I69354 Hemiplegia and hemiparesis following cerebral infarction affecting left non-dominant side: Secondary | ICD-10-CM | POA: Diagnosis not present

## 2016-11-01 DIAGNOSIS — I10 Essential (primary) hypertension: Secondary | ICD-10-CM | POA: Diagnosis not present

## 2016-11-01 DIAGNOSIS — B1910 Unspecified viral hepatitis B without hepatic coma: Secondary | ICD-10-CM | POA: Diagnosis not present

## 2016-11-04 ENCOUNTER — Encounter: Payer: Self-pay | Admitting: Hematology & Oncology

## 2016-11-04 DIAGNOSIS — R569 Unspecified convulsions: Secondary | ICD-10-CM | POA: Diagnosis not present

## 2016-11-04 DIAGNOSIS — R6510 Systemic inflammatory response syndrome (SIRS) of non-infectious origin without acute organ dysfunction: Secondary | ICD-10-CM | POA: Diagnosis not present

## 2016-11-04 DIAGNOSIS — B1910 Unspecified viral hepatitis B without hepatic coma: Secondary | ICD-10-CM | POA: Diagnosis not present

## 2016-11-04 DIAGNOSIS — I69354 Hemiplegia and hemiparesis following cerebral infarction affecting left non-dominant side: Secondary | ICD-10-CM | POA: Diagnosis not present

## 2016-11-04 DIAGNOSIS — I959 Hypotension, unspecified: Secondary | ICD-10-CM | POA: Diagnosis not present

## 2016-11-04 DIAGNOSIS — D6862 Lupus anticoagulant syndrome: Secondary | ICD-10-CM | POA: Diagnosis not present

## 2016-11-04 DIAGNOSIS — I10 Essential (primary) hypertension: Secondary | ICD-10-CM | POA: Diagnosis not present

## 2016-11-04 DIAGNOSIS — Z86718 Personal history of other venous thrombosis and embolism: Secondary | ICD-10-CM | POA: Diagnosis not present

## 2016-11-04 DIAGNOSIS — N400 Enlarged prostate without lower urinary tract symptoms: Secondary | ICD-10-CM | POA: Diagnosis not present

## 2016-11-04 DIAGNOSIS — G4733 Obstructive sleep apnea (adult) (pediatric): Secondary | ICD-10-CM | POA: Diagnosis not present

## 2016-11-05 DIAGNOSIS — R569 Unspecified convulsions: Secondary | ICD-10-CM | POA: Diagnosis not present

## 2016-11-05 DIAGNOSIS — I959 Hypotension, unspecified: Secondary | ICD-10-CM | POA: Diagnosis not present

## 2016-11-05 DIAGNOSIS — B1910 Unspecified viral hepatitis B without hepatic coma: Secondary | ICD-10-CM | POA: Diagnosis not present

## 2016-11-05 DIAGNOSIS — I10 Essential (primary) hypertension: Secondary | ICD-10-CM | POA: Diagnosis not present

## 2016-11-05 DIAGNOSIS — I69354 Hemiplegia and hemiparesis following cerebral infarction affecting left non-dominant side: Secondary | ICD-10-CM | POA: Diagnosis not present

## 2016-11-05 DIAGNOSIS — R6510 Systemic inflammatory response syndrome (SIRS) of non-infectious origin without acute organ dysfunction: Secondary | ICD-10-CM | POA: Diagnosis not present

## 2016-11-05 DIAGNOSIS — G4733 Obstructive sleep apnea (adult) (pediatric): Secondary | ICD-10-CM | POA: Diagnosis not present

## 2016-11-05 DIAGNOSIS — Z86718 Personal history of other venous thrombosis and embolism: Secondary | ICD-10-CM | POA: Diagnosis not present

## 2016-11-05 DIAGNOSIS — N400 Enlarged prostate without lower urinary tract symptoms: Secondary | ICD-10-CM | POA: Diagnosis not present

## 2016-11-05 DIAGNOSIS — D6862 Lupus anticoagulant syndrome: Secondary | ICD-10-CM | POA: Diagnosis not present

## 2016-11-06 ENCOUNTER — Other Ambulatory Visit: Payer: Medicare Other

## 2016-11-06 DIAGNOSIS — I10 Essential (primary) hypertension: Secondary | ICD-10-CM | POA: Diagnosis not present

## 2016-11-06 DIAGNOSIS — I69354 Hemiplegia and hemiparesis following cerebral infarction affecting left non-dominant side: Secondary | ICD-10-CM | POA: Diagnosis not present

## 2016-11-06 DIAGNOSIS — I959 Hypotension, unspecified: Secondary | ICD-10-CM | POA: Diagnosis not present

## 2016-11-06 DIAGNOSIS — D6862 Lupus anticoagulant syndrome: Secondary | ICD-10-CM | POA: Diagnosis not present

## 2016-11-06 DIAGNOSIS — B1910 Unspecified viral hepatitis B without hepatic coma: Secondary | ICD-10-CM | POA: Diagnosis not present

## 2016-11-06 DIAGNOSIS — R569 Unspecified convulsions: Secondary | ICD-10-CM | POA: Diagnosis not present

## 2016-11-06 DIAGNOSIS — G4733 Obstructive sleep apnea (adult) (pediatric): Secondary | ICD-10-CM | POA: Diagnosis not present

## 2016-11-06 DIAGNOSIS — N400 Enlarged prostate without lower urinary tract symptoms: Secondary | ICD-10-CM | POA: Diagnosis not present

## 2016-11-06 DIAGNOSIS — R6510 Systemic inflammatory response syndrome (SIRS) of non-infectious origin without acute organ dysfunction: Secondary | ICD-10-CM | POA: Diagnosis not present

## 2016-11-06 DIAGNOSIS — Z86718 Personal history of other venous thrombosis and embolism: Secondary | ICD-10-CM | POA: Diagnosis not present

## 2016-11-07 DIAGNOSIS — I959 Hypotension, unspecified: Secondary | ICD-10-CM | POA: Diagnosis not present

## 2016-11-07 DIAGNOSIS — B1910 Unspecified viral hepatitis B without hepatic coma: Secondary | ICD-10-CM | POA: Diagnosis not present

## 2016-11-07 DIAGNOSIS — N400 Enlarged prostate without lower urinary tract symptoms: Secondary | ICD-10-CM | POA: Diagnosis not present

## 2016-11-07 DIAGNOSIS — R569 Unspecified convulsions: Secondary | ICD-10-CM | POA: Diagnosis not present

## 2016-11-07 DIAGNOSIS — Z86718 Personal history of other venous thrombosis and embolism: Secondary | ICD-10-CM | POA: Diagnosis not present

## 2016-11-07 DIAGNOSIS — G4733 Obstructive sleep apnea (adult) (pediatric): Secondary | ICD-10-CM | POA: Diagnosis not present

## 2016-11-07 DIAGNOSIS — I10 Essential (primary) hypertension: Secondary | ICD-10-CM | POA: Diagnosis not present

## 2016-11-07 DIAGNOSIS — R6510 Systemic inflammatory response syndrome (SIRS) of non-infectious origin without acute organ dysfunction: Secondary | ICD-10-CM | POA: Diagnosis not present

## 2016-11-07 DIAGNOSIS — D6862 Lupus anticoagulant syndrome: Secondary | ICD-10-CM | POA: Diagnosis not present

## 2016-11-07 DIAGNOSIS — I69354 Hemiplegia and hemiparesis following cerebral infarction affecting left non-dominant side: Secondary | ICD-10-CM | POA: Diagnosis not present

## 2016-11-08 DIAGNOSIS — Z86718 Personal history of other venous thrombosis and embolism: Secondary | ICD-10-CM | POA: Diagnosis not present

## 2016-11-08 DIAGNOSIS — I10 Essential (primary) hypertension: Secondary | ICD-10-CM | POA: Diagnosis not present

## 2016-11-08 DIAGNOSIS — B1910 Unspecified viral hepatitis B without hepatic coma: Secondary | ICD-10-CM | POA: Diagnosis not present

## 2016-11-08 DIAGNOSIS — N400 Enlarged prostate without lower urinary tract symptoms: Secondary | ICD-10-CM | POA: Diagnosis not present

## 2016-11-08 DIAGNOSIS — G4733 Obstructive sleep apnea (adult) (pediatric): Secondary | ICD-10-CM | POA: Diagnosis not present

## 2016-11-08 DIAGNOSIS — I69354 Hemiplegia and hemiparesis following cerebral infarction affecting left non-dominant side: Secondary | ICD-10-CM | POA: Diagnosis not present

## 2016-11-08 DIAGNOSIS — D6862 Lupus anticoagulant syndrome: Secondary | ICD-10-CM | POA: Diagnosis not present

## 2016-11-08 DIAGNOSIS — I959 Hypotension, unspecified: Secondary | ICD-10-CM | POA: Diagnosis not present

## 2016-11-08 DIAGNOSIS — R569 Unspecified convulsions: Secondary | ICD-10-CM | POA: Diagnosis not present

## 2016-11-08 DIAGNOSIS — R6510 Systemic inflammatory response syndrome (SIRS) of non-infectious origin without acute organ dysfunction: Secondary | ICD-10-CM | POA: Diagnosis not present

## 2016-11-11 DIAGNOSIS — I959 Hypotension, unspecified: Secondary | ICD-10-CM | POA: Diagnosis not present

## 2016-11-11 DIAGNOSIS — R6510 Systemic inflammatory response syndrome (SIRS) of non-infectious origin without acute organ dysfunction: Secondary | ICD-10-CM | POA: Diagnosis not present

## 2016-11-11 DIAGNOSIS — I69354 Hemiplegia and hemiparesis following cerebral infarction affecting left non-dominant side: Secondary | ICD-10-CM | POA: Diagnosis not present

## 2016-11-11 DIAGNOSIS — G4733 Obstructive sleep apnea (adult) (pediatric): Secondary | ICD-10-CM | POA: Diagnosis not present

## 2016-11-11 DIAGNOSIS — N400 Enlarged prostate without lower urinary tract symptoms: Secondary | ICD-10-CM | POA: Diagnosis not present

## 2016-11-11 DIAGNOSIS — Z86718 Personal history of other venous thrombosis and embolism: Secondary | ICD-10-CM | POA: Diagnosis not present

## 2016-11-11 DIAGNOSIS — I10 Essential (primary) hypertension: Secondary | ICD-10-CM | POA: Diagnosis not present

## 2016-11-11 DIAGNOSIS — B1910 Unspecified viral hepatitis B without hepatic coma: Secondary | ICD-10-CM | POA: Diagnosis not present

## 2016-11-11 DIAGNOSIS — D6862 Lupus anticoagulant syndrome: Secondary | ICD-10-CM | POA: Diagnosis not present

## 2016-11-11 DIAGNOSIS — R569 Unspecified convulsions: Secondary | ICD-10-CM | POA: Diagnosis not present

## 2016-11-12 ENCOUNTER — Encounter: Payer: Self-pay | Admitting: Hematology & Oncology

## 2016-11-13 DIAGNOSIS — N400 Enlarged prostate without lower urinary tract symptoms: Secondary | ICD-10-CM | POA: Diagnosis not present

## 2016-11-13 DIAGNOSIS — I959 Hypotension, unspecified: Secondary | ICD-10-CM | POA: Diagnosis not present

## 2016-11-13 DIAGNOSIS — G4733 Obstructive sleep apnea (adult) (pediatric): Secondary | ICD-10-CM | POA: Diagnosis not present

## 2016-11-13 DIAGNOSIS — I10 Essential (primary) hypertension: Secondary | ICD-10-CM | POA: Diagnosis not present

## 2016-11-13 DIAGNOSIS — R569 Unspecified convulsions: Secondary | ICD-10-CM | POA: Diagnosis not present

## 2016-11-13 DIAGNOSIS — Z86718 Personal history of other venous thrombosis and embolism: Secondary | ICD-10-CM | POA: Diagnosis not present

## 2016-11-13 DIAGNOSIS — D6862 Lupus anticoagulant syndrome: Secondary | ICD-10-CM | POA: Diagnosis not present

## 2016-11-13 DIAGNOSIS — I69354 Hemiplegia and hemiparesis following cerebral infarction affecting left non-dominant side: Secondary | ICD-10-CM | POA: Diagnosis not present

## 2016-11-13 DIAGNOSIS — R6510 Systemic inflammatory response syndrome (SIRS) of non-infectious origin without acute organ dysfunction: Secondary | ICD-10-CM | POA: Diagnosis not present

## 2016-11-13 DIAGNOSIS — B1910 Unspecified viral hepatitis B without hepatic coma: Secondary | ICD-10-CM | POA: Diagnosis not present

## 2016-11-15 DIAGNOSIS — N400 Enlarged prostate without lower urinary tract symptoms: Secondary | ICD-10-CM | POA: Diagnosis not present

## 2016-11-15 DIAGNOSIS — I69354 Hemiplegia and hemiparesis following cerebral infarction affecting left non-dominant side: Secondary | ICD-10-CM | POA: Diagnosis not present

## 2016-11-15 DIAGNOSIS — Z86718 Personal history of other venous thrombosis and embolism: Secondary | ICD-10-CM | POA: Diagnosis not present

## 2016-11-15 DIAGNOSIS — B1910 Unspecified viral hepatitis B without hepatic coma: Secondary | ICD-10-CM | POA: Diagnosis not present

## 2016-11-15 DIAGNOSIS — R569 Unspecified convulsions: Secondary | ICD-10-CM | POA: Diagnosis not present

## 2016-11-15 DIAGNOSIS — R6510 Systemic inflammatory response syndrome (SIRS) of non-infectious origin without acute organ dysfunction: Secondary | ICD-10-CM | POA: Diagnosis not present

## 2016-11-15 DIAGNOSIS — D6862 Lupus anticoagulant syndrome: Secondary | ICD-10-CM | POA: Diagnosis not present

## 2016-11-15 DIAGNOSIS — I959 Hypotension, unspecified: Secondary | ICD-10-CM | POA: Diagnosis not present

## 2016-11-15 DIAGNOSIS — G4733 Obstructive sleep apnea (adult) (pediatric): Secondary | ICD-10-CM | POA: Diagnosis not present

## 2016-11-15 DIAGNOSIS — I10 Essential (primary) hypertension: Secondary | ICD-10-CM | POA: Diagnosis not present

## 2016-11-19 DIAGNOSIS — G4733 Obstructive sleep apnea (adult) (pediatric): Secondary | ICD-10-CM | POA: Diagnosis not present

## 2016-11-19 DIAGNOSIS — I10 Essential (primary) hypertension: Secondary | ICD-10-CM | POA: Diagnosis not present

## 2016-11-19 DIAGNOSIS — D6862 Lupus anticoagulant syndrome: Secondary | ICD-10-CM | POA: Diagnosis not present

## 2016-11-19 DIAGNOSIS — R569 Unspecified convulsions: Secondary | ICD-10-CM | POA: Diagnosis not present

## 2016-11-19 DIAGNOSIS — R6510 Systemic inflammatory response syndrome (SIRS) of non-infectious origin without acute organ dysfunction: Secondary | ICD-10-CM | POA: Diagnosis not present

## 2016-11-19 DIAGNOSIS — Z86718 Personal history of other venous thrombosis and embolism: Secondary | ICD-10-CM | POA: Diagnosis not present

## 2016-11-19 DIAGNOSIS — N400 Enlarged prostate without lower urinary tract symptoms: Secondary | ICD-10-CM | POA: Diagnosis not present

## 2016-11-19 DIAGNOSIS — I69354 Hemiplegia and hemiparesis following cerebral infarction affecting left non-dominant side: Secondary | ICD-10-CM | POA: Diagnosis not present

## 2016-11-19 DIAGNOSIS — I959 Hypotension, unspecified: Secondary | ICD-10-CM | POA: Diagnosis not present

## 2016-11-19 DIAGNOSIS — B1910 Unspecified viral hepatitis B without hepatic coma: Secondary | ICD-10-CM | POA: Diagnosis not present

## 2016-11-20 ENCOUNTER — Other Ambulatory Visit: Payer: Medicare Other

## 2016-11-27 ENCOUNTER — Other Ambulatory Visit: Payer: Medicare Other

## 2016-11-27 DIAGNOSIS — G4733 Obstructive sleep apnea (adult) (pediatric): Secondary | ICD-10-CM | POA: Diagnosis not present

## 2016-11-27 DIAGNOSIS — B1910 Unspecified viral hepatitis B without hepatic coma: Secondary | ICD-10-CM | POA: Diagnosis not present

## 2016-11-27 DIAGNOSIS — I959 Hypotension, unspecified: Secondary | ICD-10-CM | POA: Diagnosis not present

## 2016-11-27 DIAGNOSIS — I69354 Hemiplegia and hemiparesis following cerebral infarction affecting left non-dominant side: Secondary | ICD-10-CM | POA: Diagnosis not present

## 2016-11-27 DIAGNOSIS — D6862 Lupus anticoagulant syndrome: Secondary | ICD-10-CM | POA: Diagnosis not present

## 2016-11-27 DIAGNOSIS — N400 Enlarged prostate without lower urinary tract symptoms: Secondary | ICD-10-CM | POA: Diagnosis not present

## 2016-11-27 DIAGNOSIS — R569 Unspecified convulsions: Secondary | ICD-10-CM | POA: Diagnosis not present

## 2016-11-27 DIAGNOSIS — Z86718 Personal history of other venous thrombosis and embolism: Secondary | ICD-10-CM | POA: Diagnosis not present

## 2016-11-27 DIAGNOSIS — I10 Essential (primary) hypertension: Secondary | ICD-10-CM | POA: Diagnosis not present

## 2016-11-27 DIAGNOSIS — R6510 Systemic inflammatory response syndrome (SIRS) of non-infectious origin without acute organ dysfunction: Secondary | ICD-10-CM | POA: Diagnosis not present

## 2016-12-04 DIAGNOSIS — I69352 Hemiplegia and hemiparesis following cerebral infarction affecting left dominant side: Secondary | ICD-10-CM | POA: Diagnosis not present

## 2016-12-04 DIAGNOSIS — R1311 Dysphagia, oral phase: Secondary | ICD-10-CM | POA: Diagnosis not present

## 2016-12-04 DIAGNOSIS — I959 Hypotension, unspecified: Secondary | ICD-10-CM | POA: Diagnosis not present

## 2016-12-04 DIAGNOSIS — R69 Illness, unspecified: Secondary | ICD-10-CM | POA: Diagnosis not present

## 2016-12-04 DIAGNOSIS — Z23 Encounter for immunization: Secondary | ICD-10-CM | POA: Diagnosis not present

## 2016-12-04 DIAGNOSIS — R05 Cough: Secondary | ICD-10-CM | POA: Diagnosis not present

## 2016-12-04 DIAGNOSIS — H9193 Unspecified hearing loss, bilateral: Secondary | ICD-10-CM | POA: Diagnosis not present

## 2016-12-05 ENCOUNTER — Ambulatory Visit (HOSPITAL_BASED_OUTPATIENT_CLINIC_OR_DEPARTMENT_OTHER): Payer: Medicare HMO | Admitting: Family

## 2016-12-05 ENCOUNTER — Other Ambulatory Visit (HOSPITAL_BASED_OUTPATIENT_CLINIC_OR_DEPARTMENT_OTHER): Payer: Medicare HMO

## 2016-12-05 VITALS — BP 156/64 | HR 73 | Temp 98.8°F | Resp 19 | Wt 205.0 lb

## 2016-12-05 DIAGNOSIS — Z8673 Personal history of transient ischemic attack (TIA), and cerebral infarction without residual deficits: Secondary | ICD-10-CM

## 2016-12-05 DIAGNOSIS — D693 Immune thrombocytopenic purpura: Secondary | ICD-10-CM | POA: Diagnosis not present

## 2016-12-05 DIAGNOSIS — Z7901 Long term (current) use of anticoagulants: Secondary | ICD-10-CM | POA: Diagnosis not present

## 2016-12-05 DIAGNOSIS — I69359 Hemiplegia and hemiparesis following cerebral infarction affecting unspecified side: Secondary | ICD-10-CM

## 2016-12-05 LAB — CBC WITH DIFFERENTIAL (CANCER CENTER ONLY)
BASO#: 0 10*3/uL (ref 0.0–0.2)
BASO%: 0.4 % (ref 0.0–2.0)
EOS%: 7.1 % — AB (ref 0.0–7.0)
Eosinophils Absolute: 0.8 10*3/uL — ABNORMAL HIGH (ref 0.0–0.5)
HEMATOCRIT: 39.8 % (ref 38.7–49.9)
HEMOGLOBIN: 13.2 g/dL (ref 13.0–17.1)
LYMPH#: 3.3 10*3/uL (ref 0.9–3.3)
LYMPH%: 30.5 % (ref 14.0–48.0)
MCH: 31.2 pg (ref 28.0–33.4)
MCHC: 33.2 g/dL (ref 32.0–35.9)
MCV: 94 fL (ref 82–98)
MONO#: 2.1 10*3/uL — AB (ref 0.1–0.9)
MONO%: 19.7 % — ABNORMAL HIGH (ref 0.0–13.0)
NEUT%: 42.3 % (ref 40.0–80.0)
NEUTROS ABS: 4.5 10*3/uL (ref 1.5–6.5)
Platelets: 170 10*3/uL (ref 145–400)
RBC: 4.23 10*6/uL (ref 4.20–5.70)
RDW: 15.2 % (ref 11.1–15.7)
WBC: 10.7 10*3/uL — ABNORMAL HIGH (ref 4.0–10.0)

## 2016-12-05 LAB — PROTIME-INR (CHCC SATELLITE)
INR: 1.4 — AB (ref 2.0–3.5)
Protime: 16.8 Seconds — ABNORMAL HIGH (ref 10.6–13.4)

## 2016-12-05 NOTE — Progress Notes (Signed)
Hematology and Oncology Follow Up Visit  Mark [name] [id number] [birth year] 71 y.o. 12/05/2016   Principle Diagnosis:  1. Refractory immune thrombocytopenia. 2. 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 3. Positive lupus anticoagulant.  Current Therapy:   Xarelto 20 mg PO daily   Interim History:  Mark [name] is here today with his wife for follow-up. He is home and doing fairly well. He is still having some fatigue and weakness due to deconditioning.  He is scheduled to start PT next week. He may also do speech therapy to help with his swallowing. He has not had any choking episodes so far.  No episodes of bleeding, bruising or petechiae. No lymphadenopathy found on exam.  No fever, chills, n/v, cough, rash, dizziness, SOB, chest pain, palpitations, abdominal pain or changes in bowel or bladder habits.  The neuropathy in his feet is unchanged. He wears his left knee brace faithfully for support. No recent falls.  He has maintained a good appetite and is staying well hydrated. His weight is stable.  They are planning a "staycation" next week for his wife's birthday and are really excited to go swimming at the hotel.   ECOG Performance Status: 1 - Symptomatic but completely ambulatory  Medications:  Allergies as of 12/05/2016   No Known Allergies     Medication List       Accurate as of 12/05/16  3:15 PM. Always use your most recent med list.          acetaminophen 500 MG tablet Commonly known as:  TYLENOL Take 1 tablet (500 mg total) by mouth every 6 (six) hours as needed (pain).   alendronate 70 MG tablet Commonly known as:  FOSAMAX Take 70 mg by mouth every Sunday. Take with a full glass of water on an empty stomach.   amLODipine 2.5 MG tablet Commonly known as:  NORVASC   aspirin 81 MG chewable tablet Chew 1 tablet (81 mg total) by mouth at bedtime.   bethanechol 25 MG tablet Commonly known as:  URECHOLINE   buPROPion 150 MG 24 hr  tablet Commonly known as:  WELLBUTRIN XL Take 150 mg by mouth daily.   CRESTOR 20 MG tablet Generic drug:  rosuvastatin Take 20 mg by mouth at bedtime.   dexamethasone 4 MG tablet Commonly known as:  DECADRON   famotidine 40 MG tablet Commonly known as:  PEPCID Take 40 mg by mouth 2 (two) times daily.   fluticasone 50 MCG/ACT nasal spray Commonly known as:  FLONASE Place 2 sprays into both nostrils daily as needed for allergies or rhinitis.   levETIRAcetam 500 MG tablet Commonly known as:  KEPPRA Take 1,500 mg by mouth 2 (two) times daily. 3 tabs  in am and 3 tabs in pm   multivitamin with minerals tablet Take 1 tablet by mouth 2 (two) times daily.   polyethylene glycol packet Commonly known as:  MIRALAX / GLYCOLAX Take 17 g by mouth daily as needed.   rivaroxaban 20 MG Tabs tablet Commonly known as:  XARELTO Take 1 tablet (20 mg total) by mouth daily with breakfast.   sertraline 100 MG tablet Commonly known as:  ZOLOFT Take 200 mg by mouth daily.   tamsulosin 0.4 MG Caps capsule Commonly known as:  FLOMAX Take 0.8 mg by mouth at bedtime.   traMADol 50 MG tablet Commonly known as:  ULTRAM Take 50 mg by mouth every 6 (six) hours as needed (pain).   Vitamin D3 1000 units Caps Take 2,000  Units by mouth 2 (two) times daily after a meal.       Allergies: No Known Allergies  Past Medical History, Surgical history, Social history, and Family History were reviewed and updated.  Review of Systems: All other 10 point review of systems is negative.   Physical Exam:  weight is 205 lb (93 kg). His oral temperature is 98.8 F (37.1 C). His blood pressure is 156/64 (abnormal) and his pulse is 73. His respiration is 19 and oxygen saturation is 95%.   Wt Readings from Last 3 Encounters:  12/05/16 205 lb (93 kg)  10/22/16 212 lb 1.3 oz (96.2 kg)  10/12/16 200 lb (90.7 kg)    Ocular: Sclerae unicteric, pupils equal, round and reactive to light Ear-nose-throat:  Oropharynx clear, dentition fair Lymphatic: No cervical, supraclavicular or axillary adenopathy Lungs no rales or rhonchi, good excursion bilaterally Heart regular rate and rhythm, no murmur appreciated Abd soft, nontender, positive bowel sounds, no liver or spleen tip palpated on exam, no fluid wave MSK no focal spinal tenderness, no joint edema Neuro: non-focal, well-oriented, appropriate affect Breasts: Deferred   Lab Results  Component Value Date   WBC 10.7 (H) 12/05/2016   HGB 13.2 12/05/2016   HCT 39.8 12/05/2016   MCV 94 12/05/2016   PLT 170 12/05/2016   Lab Results  Component Value Date   FERRITIN 1,063 (H) 10/23/2016   IRON 27 (L) 10/23/2016   TIBC 160 (L) 10/23/2016   UIBC 133 10/23/2016   IRONPCTSAT 17 (L) 10/23/2016   Lab Results  Component Value Date   RETICCTPCT 1.5 08/20/2010   RBC 4.23 12/05/2016   RETICCTABS 75.3 08/20/2010   No results found for: KPAFRELGTCHN, LAMBDASER, KAPLAMBRATIO No results found for: IGGSERUM, IGA, IGMSERUM No results found for: Kathrynn Ducking, MSPIKE, SPEI   Chemistry      Component Value Date/Time   NA 139 10/25/2016 0448   NA 140 08/14/2016 1352   NA 138 05/09/2016 1321   K 4.2 10/25/2016 0448   K 3.6 08/14/2016 1352   K 4.3 05/09/2016 1321   CL 108 10/25/2016 0448   CL 105 08/14/2016 1352   CO2 24 10/25/2016 0448   CO2 24 08/14/2016 1352   CO2 22 05/09/2016 1321   BUN 15 10/25/2016 0448   BUN 37 (H) 08/14/2016 1352   BUN 36.4 (H) 05/09/2016 1321   CREATININE 1.36 (H) 10/26/2016 0519   CREATININE 1.3 (H) 08/14/2016 1352   CREATININE 1.5 (H) 05/09/2016 1321      Component Value Date/Time   CALCIUM 8.3 (L) 10/25/2016 0448   CALCIUM 9.0 08/14/2016 1352   CALCIUM 9.9 05/09/2016 1321   ALKPHOS 42 10/24/2016 0417   ALKPHOS 70 08/14/2016 1352   ALKPHOS 103 05/09/2016 1321   AST 28 10/24/2016 0417   AST 31 08/14/2016 1352   AST 39 (H) 05/09/2016 1321   ALT 47 10/24/2016 0417    ALT 79 (H) 08/14/2016 1352   ALT 64 (H) 05/09/2016 1321   BILITOT 0.6 10/24/2016 0417   BILITOT 0.70 08/14/2016 1352   BILITOT 0.49 05/09/2016 1321      Impression and Plan: Mark [name] is a very pleasant 70 yo caucasian gentleman with refractory immune thrombocytopenia. 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 He is now on Xarelto and doing well. No bleeding, bruising or petechiae.  We will plan to see him back again in 4 weeks for follow-up and lab work.  Both he and  his wonderful wife know to contact our office with any questions or concerns. We can certainly see him sooner if need be.   Eliezer Bottom, NP 8/23/20183:15 PM

## 2016-12-09 ENCOUNTER — Ambulatory Visit: Payer: Medicare HMO | Admitting: Physical Therapy

## 2016-12-09 ENCOUNTER — Ambulatory Visit: Payer: Medicare HMO | Attending: Family Medicine | Admitting: Physical Therapy

## 2016-12-09 ENCOUNTER — Encounter: Payer: Self-pay | Admitting: Physical Therapy

## 2016-12-09 DIAGNOSIS — R42 Dizziness and giddiness: Secondary | ICD-10-CM

## 2016-12-09 DIAGNOSIS — R261 Paralytic gait: Secondary | ICD-10-CM

## 2016-12-09 DIAGNOSIS — R2689 Other abnormalities of gait and mobility: Secondary | ICD-10-CM

## 2016-12-09 DIAGNOSIS — R1312 Dysphagia, oropharyngeal phase: Secondary | ICD-10-CM | POA: Diagnosis present

## 2016-12-09 DIAGNOSIS — R2681 Unsteadiness on feet: Secondary | ICD-10-CM

## 2016-12-09 DIAGNOSIS — R296 Repeated falls: Secondary | ICD-10-CM | POA: Diagnosis present

## 2016-12-10 NOTE — Therapy (Signed)
[facility] [address] [place], [place], [address] Phone: [phone]   Fax:  [phone]  Physical Therapy Evaluation  Patient Details  Name: Mark [name] MRN: [id number] Date of Birth: [birth year] Referring Provider: Harlan Stains, MD  Encounter Date: 12/09/69      PT End of Session - 12/09/16 1838    Visit Number 1   Number of Visits 18   Date for PT Re-Evaluation 02/07/17   Authorization Type AETNA Medicare G-code & progress note   PT Start Time 1615   PT Stop Time 1705   PT Time Calculation (min) 50 min   Equipment Utilized During Treatment Gait belt   Activity Tolerance Patient tolerated treatment well   Behavior During Therapy [facility] for tasks assessed/performed      Past Medical History:  Diagnosis Date  . Chronic ITP (idiopathic thrombocytopenia) (HCC)   . Coronary artery disease 1992   MI  . DVT (deep venous thrombosis) ([place])   . 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 Hypertension   . Lupus anticoagulant disorder ([place])   . Multiple closed anterior-posterior compression fractures of pelvis ([place])   . Seizures ([place])   . Stroke [facility]) 02/27/2011   Left side weakness    Past Surgical History:  Procedure Laterality Date  . blood clot Right    surgical removal  . CHOLECYSTECTOMY    . CORONARY ANGIOPLASTY  1992  . SPLENECTOMY, TOTAL    . vertebralplasty      There were no vitals filed for this visit.       Subjective Assessment - 12/09/16 1620    Subjective Patient was hospitalized 6/30-10/16/2016 with acute DVT in right leg and leaking Baker's Cyst with hematoma that burst on Right leg also. His left side is hemiparesis from CVA 02/27/2011. He was rehospitalized 7/9-7/14/2018 for syncope, severe orthostatic hypotension & SIRS. He had home health PT & OT from 7/17-11/19/2016. He presents to PT for evaluation.    Patient is accompained by: Family member   Pertinent History CVA spastic hemiplegia, CAD/MI, depression,  ADHD, osteoporosis, HTN, lupus, epilepsy, sz, hearing loss,   Limitations Lifting;Standing;Walking;House hold activities   Patient Stated Goals to return his activity level to prior level   Currently in Pain? 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 1-2/month,   bruised right hip ~2 months ago   Has the patient had a decrease in activity level because of a fear of falling?  Yes   Is the  patient reluctant to leave their home because of a fear of falling?  Yes     [place] residence   Living Arrangements Spouse/significant other   Type of [place] to enter   Entrance Stairs-Number of Steps 3 in front & 5 in back   [place] level   [place] - single point;Tub bench;Grab bars - tub/shower;Toilet riser;Grab bars - toilet;Hand held shower head;Transport chair  hemiwalker     Prior Function   Level of [place] Independent with household mobility with device  community with supervision with cane   Vocation On disability     Observation/Other Assessments   Focus on Therapeutic Outcomes (FOTO)  53.21  Functional Status   Stroke Impact Scale  61.1%     Coordination   Gross Motor Movements are Fluid and Coordinated No   Coordination and Movement Description LLE impaired control with placement. Hypertonicity with extension & internal rotation impairs coordination.      Posture/Postural Control   Posture/Postural Control Postural limitations   Postural Limitations Rounded Shoulders;Forward head;Flexed trunk;Weight shift right     Tone   Assessment Location Left Lower Extremity;Left Upper Extremity     ROM / Strength   AROM / PROM / Strength PROM;Strength     PROM   Overall PROM  Deficits   Overall PROM Comments RUE & RLE WFL, LLE hip & knee WFL   PROM Assessment Site Ankle   Right/Left Ankle Left   Left Ankle Dorsiflexion -15     Strength   Overall Strength Deficits   Overall Strength Comments LLE hip flexion & knee extension grossly 5/5, hypertonicity limits formal testing.      Transfers   Transfers Sit to Stand;Stand to Sit   Sit to Stand 5: Supervision;With upper extremity assist;With armrests;From chair/3-in-1  pushes back of legs against chair to stabilize   Stand to Sit 5: Supervision;With upper extremity assist;With armrests;To chair/3-in-1  pushes back of legs against chair to control     Ambulation/Gait   Ambulation/Gait Yes   Ambulation/Gait Assistance 4: Min guard   Ambulation Distance (Feet) 200 Feet   Assistive device Straight cane;Other (Comment)  AFO   Gait Pattern Step-through pattern;Decreased arm swing - left;Decreased step length - right;Decreased stance time - left;Decreased stride length;Decreased hip/knee flexion - left;Decreased weight shift to left;Left hip hike;Left genu recurvatum;Scissoring;Lateral hip instability;Trunk flexed;Narrow base of support;Poor foot clearance - left  left foot internally rotated & supinated   Ambulation Surface Indoor;Level   Gait velocity 1.32 ft/sec     Standardized Balance Assessment   Standardized Balance  Assessment Timed Up and Go Test;Dynamic Gait Index     Dynamic Gait Index   Level Surface Moderate Impairment   Change in Gait Speed Severe Impairment   Gait with Horizontal Head Turns Moderate Impairment   Gait with Vertical Head Turns Moderate Impairment   Gait and Pivot Turn Moderate Impairment   Step Over Obstacle Severe Impairment   Step Around Obstacles Moderate Impairment   Steps Moderate Impairment   Total Score 6     Timed Up and Go Test   Normal TUG (seconds) 17.17  with cane & AFO   Cognitive TUG (seconds) 20.77  did not stop naming, 21% increase from std TUG     LUE Tone   LUE Tone Flaccid     LLE Tone   LLE Tone Hypertonic;Modified Ashworth  LLE Tone   Modified Ashworth Scale for Grading Hypertonia LLE Considerable increase in muschle tone, passive movement difficult            Vestibular Assessment - 12/09/16 1615      Orthostatics   BP supine (x 5 minutes) 145/82   HR supine (x 5 minutes) 71  96% SpO2   BP standing (after 1 minute) 95/61   HR standing (after 1 minute) 88  97% SpO2   BP standing (after 3 minutes) 118/76   HR standing (after 3 minutes) 89  90%        Objective measurements completed on examination: See above findings.                  PT Education - 12/09/16 1700    Education provided Yes   Education Details Fluid intake & Hypotension, PT plan of care   Person(s) Educated Patient;Spouse   Methods Explanation;Demonstration   Comprehension Verbalized understanding          PT Short Term Goals - 12/09/16 1922      PT SHORT TERM GOAL #1   Title Patient ambulates around furniture with cane & AFO performing naming tasks without balance loss.    Time 1   Period Months   Status New   Target Date 12/08/16     PT SHORT TERM GOAL #2   Title Patient & wife verbalize understanding of orthostatic hypotension management techniques.    Time 1   Period Months   Status New   Target Date 12/08/16     PT SHORT  TERM GOAL #3   Title Pt will demonstrate understanding of initial HEP.    Time 1   Period Months   Status New   Target Date 12/08/16     PT SHORT TERM GOAL #4   Title Patient's gait velocity with AFO & cane 1.7 ft/sec   Time 1   Period Months   Status New   Target Date 12/08/16           PT Long Term Goals - 12/09/16 1918      PT LONG TERM GOAL #1   Title Patient and wife demonstrate and verbalize understanding of ongoing fitness plan.     Time 2   Period Months   Status New   Target Date 02/07/17     PT LONG TERM GOAL #2   Title Patient ambulates 300' on outside surfaces including grass with supervision with AFO & assistive device.    Time 2   Period Months   Status New   Target Date 02/07/17     PT LONG TERM GOAL #3   Title Patient negotiates ramps, curbs and stairs (1 rail) with AFO & assistive device with supervision.      Time 2   Period Months   Status New   Target Date 02/07/17     PT LONG TERM GOAL #4   Title Dynamic Gait Index >/= 12/24 with cane & AFO to indicate lower fall risk.    Time 2   Period Months   Status New   Target Date 02/07/17     PT LONG TERM GOAL #5   Title Timed Up & Go with cane <13.5 sec and cognitive TUG <16 sec to indicate lower fall risk.    Time 2   Period Months   Status New   Target Date 02/07/17  Plan - 2016-12-28 1912    Clinical Impression Statement This 71yo male with history of CVA in Nov. 2012 was hospitalized twice in early July for DVT, burst Baker's cyst on non-hemiplegic LE, syncope & severe Orthostatic Hypotension. He was mobile with a cane & AFO at community level & was exercising regularly prior to recent hospitalization. He became deconditioned with impaired mobility over the last 8 weeks.  He was positive for orthostatic Hypotension with systolic BP drop of 46KZ & diastolic of 99JT and his HR increased 17bpm. He was asymptomatic but changes were positive for Hypotension. His Timed Up & Go  test of 17.17sec indicates high fall risk (previous TUG on 06/17/2016 was 13.55sec) and cognitive TUG 20.77sec (21% increase) indicates high fall risk when distracted. His Gait Velocity of 1.32 ft/sec (Previous GV on 06/17/2016 was 2.91 ft/sec) and gait deviations also indicate high fall risk. Dynamic Gait Index 6/24 also indicates fall risk with gait when scanning & negotiating obstacle. Patient would benefit from skilled Physical Therapy to improve mobility & safety.    History and Personal Factors relevant to plan of care: CVA spastic hemiplegia, CAD/MI, depression, ADHD, osteoporosis, HTN, lupus, epilepsy, sz, hearing loss,   Clinical Presentation Evolving   Clinical Presentation due to: orthostatic hypotension, CVA spastic hemiplegia, deconditioning with 2 recent hospitalization   Clinical Decision Making Moderate   Rehab Potential Good   PT Frequency 2x / week   PT Duration Other (comment)  9 weeks (60 days)   PT Treatment/Interventions ADLs/Self Care Home Management;DME Instruction;Gait training;Stair training;Functional mobility training;Therapeutic activities;Therapeutic exercise;Balance training;Neuromuscular re-education;Patient/family education;Orthotic Fit/Training   PT Next Visit Plan Assess old HEP & initiate updated HEP, educate on Orthostatic Hypotension   Consulted and Agree with Plan of Care Patient;Family member/caregiver   Family Member Consulted wife      Patient will benefit from skilled therapeutic intervention in order to improve the following deficits and impairments:  Abnormal gait, Decreased activity tolerance, Decreased balance, Decreased coordination, Decreased endurance, Decreased knowledge of use of DME, Decreased mobility, Decreased range of motion, Decreased strength, Impaired flexibility, Impaired UE functional use, Postural dysfunction, Impaired tone  Visit Diagnosis: Other abnormalities of gait and mobility  Unsteadiness on feet  Paralytic gait  Dizziness and  giddiness  Repeated falls      G-Codes - December 28, 2016 1927    Functional Assessment Tool Used (Outpatient Only) Timed Up Go with cane & AFO 17.17sec and cognitive TUG 20.77sec / 21% increase   Functional Limitation Mobility: Walking and moving around   Mobility: Walking and Moving Around Current Status [phone]) At least 60 percent but less than 80 percent impaired, limited or restricted   Mobility: Walking and Moving Around Goal Status [id number]) At least 40 percent but less than 60 percent impaired, limited or restricted       Problem List Patient Active Problem List   Diagnosis Date Noted  . Pulmonary nodule 10/24/2016  . Acute hypoxemic respiratory failure ([place]) 10/21/2016  . Sepsis ([place]) 10/21/2016  . DVT (deep venous thrombosis) ([place]) 10/12/2016  . Supratherapeutic INR 10/12/2016  . Acute deep vein thrombosis (DVT) of popliteal vein of right lower extremity ([place]) 10/12/2016  . AKI (acute kidney injury) ([place]) 10/12/2016  . Fever 10/12/2016  . Leukocytosis 10/12/2016  . Bacteriuria 10/12/2016  . Deep vein thrombosis (DVT) ([place]) 03/02/2015  . Lupus anticoagulant disorder ([place]) 03/02/2015  . Ischemic stroke ([place]) 10/04/2014  . 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 Deep vein thrombosis ([place]) 10/04/2014  . 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 LA (lupus anticoagulant) disorder ([place]) 10/04/2014  . Arteriosclerosis of coronary artery 09/10/2012  . Apnea, sleep 09/10/2012  . 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 Dermatophytic onychia 01/29/2012  . Spastic hemiplegia ([place]) 12/03/2011  . Hemiparesis, left ([place]) 10/31/2011  . Dysphonia 08/13/2011  . Idiopathic thrombocytopenic purpura ([place]) 08/12/2011  . ITP (idiopathic thrombocytopenic purpura) 08/12/2011    Jamey Reas PT, DPT 12/10/2016, 12:29 PM  [facility] [address] [place], [place], [address] Phone: [phone]   Fax:  [phone]  Name:  NYZIER BOIVIN MRN: [id number] Date of Birth: October 16, 1945

## 2016-12-11 ENCOUNTER — Other Ambulatory Visit: Payer: Medicare Other

## 2016-12-12 ENCOUNTER — Encounter: Payer: Self-pay | Admitting: Physical Therapy

## 2016-12-12 ENCOUNTER — Ambulatory Visit: Payer: Medicare HMO | Admitting: Physical Therapy

## 2016-12-12 ENCOUNTER — Ambulatory Visit: Payer: Medicare HMO | Admitting: Speech Pathology

## 2016-12-12 DIAGNOSIS — R42 Dizziness and giddiness: Secondary | ICD-10-CM

## 2016-12-12 DIAGNOSIS — R1312 Dysphagia, oropharyngeal phase: Secondary | ICD-10-CM

## 2016-12-12 DIAGNOSIS — R296 Repeated falls: Secondary | ICD-10-CM

## 2016-12-12 DIAGNOSIS — R2689 Other abnormalities of gait and mobility: Secondary | ICD-10-CM

## 2016-12-12 DIAGNOSIS — R2681 Unsteadiness on feet: Secondary | ICD-10-CM

## 2016-12-12 DIAGNOSIS — R261 Paralytic gait: Secondary | ICD-10-CM

## 2016-12-12 NOTE — Therapy (Signed)
[facility] [address] [place], [place], [address] Phone: [phone]   Fax:  [phone]  Physical Therapy Treatment  Patient Details  Name: Mark [name] MRN: [id number] Date of Birth: [birth year] Referring Provider: Harlan Stains, MD  Encounter Date: 12/12/2016      PT End of Session - 12/12/16 2025    Visit Number 2   Number of Visits 18   Date for PT Re-Evaluation 02/07/17   Authorization Type AETNA Medicare G-code & progress note   PT Start Time 1014   PT Stop Time 1102   PT Time Calculation (min) 48 min   Activity Tolerance Patient tolerated treatment well   Behavior During Therapy [facility] for tasks assessed/performed      Past Medical History:  Diagnosis Date  . Chronic ITP (idiopathic thrombocytopenia) (HCC)   . Coronary artery disease 1992   MI  . DVT (deep venous thrombosis) ([place])   . 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 Hypertension   . Lupus anticoagulant disorder ([place])   . Multiple closed anterior-posterior compression fractures of pelvis ([place])   . Seizures ([place])   . Stroke [facility]) 02/27/2011   Left side weakness    Past Surgical History:  Procedure Laterality Date  . blood clot Right    surgical removal  . CHOLECYSTECTOMY    . CORONARY ANGIOPLASTY  1992  . SPLENECTOMY, TOTAL    . vertebralplasty      There were no vitals filed for this visit.      Subjective Assessment - 12/12/16 1018    Subjective He fell twice, 1st getting into tub & wife protected head. He was sitting in chair and sat on edge flipping chair. No injuries.    Patient is accompained by: Family member   Pertinent History CVA spastic hemiplegia, CAD/MI, depression, ADHD, osteoporosis, HTN, lupus, epilepsy, sz, hearing loss,   Limitations Lifting;Standing;Walking;House hold activities   Patient Stated Goals to return his activity level to prior level   Currently in Pain? Yes   Pain Score 6    Pain Location Hand   Pain Orientation  Right   Pain Descriptors / Indicators Aching   Pain Type Chronic pain  arthritis   Pain Onset More than a month ago   Pain Frequency Intermittent   Aggravating Factors  movement,    Pain Relieving Factors medication & heat.      Self-Care: Patient & wife report walking in/out bathroom to shower without AFO or shoes. He fell trying to step over side of tub. PT instructed to ambulate into bathroom with shoes & AFO, sit on toilet to take off clothes/shoes/AFO, transfer to edge of tub bench, move LEs into tub seated. After showering, dry off sitting on tub bench, transfer to toilet, dress waist down including shoes & AFO, ambulate out of bathroom. Pt & wife verbalized understanding.   PT demo, instructed in use of 24" stool for ADLs at sink to enable higher position, knees against counter for support & improved stability. Pt & wife verbalize understanding.   Signs /symptoms DVT /PE and orthostatic hypotension.  Sitting with decreased pressure on posterior thigh/calf.   Sitting down with looking under shoulders bilaterally to find chair prior to sitting.                             PT Education - 12/12/16 1015    Education provided Yes   Education Details Using AFO  to enter/exit bathroom to shower to decrease fall risk, signs of DVT & PE with need to call 911, risk factor & treatment for orthostatic hypotension.    Person(s) Educated Patient;Spouse   Methods Explanation;Verbal cues;Handout   Comprehension Verbalized understanding;Verbal cues required;Need further instruction          PT Short Term Goals - 12/12/16 2026      PT SHORT TERM GOAL #1   Title Patient ambulates around furniture with cane & AFO performing naming tasks without balance loss.    Time 1   Period Months   Status On-going   Target Date 12/08/16     PT SHORT TERM GOAL #2   Title Patient & wife verbalize understanding of orthostatic hypotension management techniques.    Time 1   Period  Months   Status New     PT SHORT TERM GOAL #3   Title Pt will demonstrate understanding of initial HEP.    Time 1   Period Months   Status On-going   Target Date 12/08/16     PT SHORT TERM GOAL #4   Title Patient's gait velocity with AFO & cane 1.7 ft/sec   Time 1   Period Months   Status On-going   Target Date 12/08/16           PT Long Term Goals - 12/12/16 2026      PT LONG TERM GOAL #1   Title Patient and wife demonstrate and verbalize understanding of ongoing fitness plan.     Time 2   Period Months   Status On-going   Target Date 02/07/17     PT LONG TERM GOAL #2   Title Patient ambulates 300' on outside surfaces including grass with supervision with AFO & assistive device.    Time 2   Period Months   Status On-going   Target Date 02/07/17     PT LONG TERM GOAL #3   Title Patient negotiates ramps, curbs and stairs (1 rail) with AFO & assistive device with supervision.      Time 2   Period Months   Status On-going   Target Date 02/07/17     PT LONG TERM GOAL #4   Title Dynamic Gait Index >/= 12/24 with cane & AFO to indicate lower fall risk.    Time 2   Period Months   Status On-going   Target Date 02/07/17     PT LONG TERM GOAL #5   Title Timed Up & Go with cane <13.5 sec and cognitive TUG <16 sec to indicate lower fall risk.    Time 2   Period Months   Status On-going   Target Date 02/07/17               Plan - 12/12/16 2027    Clinical Impression Statement Patient and wife verbalize improved understanding of DVT/PE & orthostatic hypotension. They feel PT instruction in bathroom activities entering/exiting with AFO and sitting on 24" stool for ADLs at sink.    Rehab Potential Good   PT Frequency 2x / week   PT Duration Other (comment)  9 weeks (60 days)   PT Treatment/Interventions ADLs/Self Care Home Management;DME Instruction;Gait training;Stair training;Functional mobility training;Therapeutic activities;Therapeutic exercise;Balance  training;Neuromuscular re-education;Patient/family education;Orthotic Fit/Training   PT Next Visit Plan Assess old HEP & initiate updated HEP, review Orthostatic Hypotension   Consulted and Agree with Plan of Care Patient;Family member/caregiver   Family Member Consulted wife      Patient will  benefit from skilled therapeutic intervention in order to improve the following deficits and impairments:  Abnormal gait, Decreased activity tolerance, Decreased balance, Decreased coordination, Decreased endurance, Decreased knowledge of use of DME, Decreased mobility, Decreased range of motion, Decreased strength, Impaired flexibility, Impaired UE functional use, Postural dysfunction, Impaired tone  Visit Diagnosis: Other abnormalities of gait and mobility  Unsteadiness on feet  Paralytic gait  Dizziness and giddiness  Repeated falls     Problem List Patient Active Problem List   Diagnosis Date Noted  . Pulmonary nodule 10/24/2016  . Acute hypoxemic respiratory failure ([place]) 10/21/2016  . Sepsis ([place]) 10/21/2016  . DVT (deep venous thrombosis) ([place]) 10/12/2016  . Supratherapeutic INR 10/12/2016  . Acute deep vein thrombosis (DVT) of popliteal vein of right lower extremity ([place]) 10/12/2016  . AKI (acute kidney injury) ([place]) 10/12/2016  . Fever 10/12/2016  . Leukocytosis 10/12/2016  . Bacteriuria 10/12/2016  . Deep vein thrombosis (DVT) ([place]) 03/02/2015  . Lupus anticoagulant disorder ([place]) 03/02/2015  . Ischemic stroke ([place]) 10/04/2014  . 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 Deep vein thrombosis ([place]) 10/04/2014  . 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 LA (lupus anticoagulant) disorder ([place]) 10/04/2014  . Arteriosclerosis of coronary artery 09/10/2012  . Apnea, sleep 09/10/2012  . 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 Dermatophytic onychia 01/29/2012  . Spastic hemiplegia ([place]) 12/03/2011  . Hemiparesis, left ([place]) 10/31/2011  . Dysphonia 08/13/2011  .  Idiopathic thrombocytopenic purpura ([place]) 08/12/2011  . ITP (idiopathic thrombocytopenic purpura) 08/12/2011    Jamey Reas PT, DPT 12/12/2016, 8:30 PM  [facility] [address] [place], [place], [address] Phone: [phone]   Fax:  [phone]  Name: NICKLOUS ABURTO MRN: [id number] Date of Birth: May 19, 1945

## 2016-12-12 NOTE — Therapy (Signed)
[facility] [address] [place], [place], [address] Phone: [phone]   Fax:  [phone]  Speech Language Pathology Evaluation  Patient Details  Name: Mark [name] MRN: [id number] Date of Birth: Jan 08, 1946 Referring Provider: Dr. Harlan [name]  Encounter Date: 12/12/2016      End of Session - 12/12/16 1358    Visit Number 1   Number of Visits 17   Date for SLP Re-Evaluation 02/07/17   SLP Start Time 1115   SLP Stop Time  1158   SLP Time Calculation (min) 43 min   Activity Tolerance Patient tolerated treatment well      Past Medical History:  Diagnosis Date  . Chronic ITP (idiopathic thrombocytopenia) (HCC)   . Coronary artery disease 1992   MI  . DVT (deep venous thrombosis) ([place])   . 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 Hypertension   . Lupus anticoagulant disorder ([place])   . Multiple closed anterior-posterior compression fractures of pelvis ([place])   . Seizures ([place])   . Stroke [facility]) 02/27/2011   Left side weakness    Past Surgical History:  Procedure Laterality Date  . blood clot Right    surgical removal  . CHOLECYSTECTOMY    . CORONARY ANGIOPLASTY  1992  . SPLENECTOMY, TOTAL    . vertebralplasty      There were no vitals filed for this visit.      Subjective Assessment - 12/12/16 1705    Subjective "I have trouble with crumbly foods like ground beef"            SLP Evaluation [facility] - 12/12/16 1124      SLP Visit Information   SLP Received On 12/12/16   Referring Provider Dr. Harlan [name]   Onset Date 10/12/16   Medical Diagnosis DVT, old CVA     Subjective   Subjective "He used to sing as a Printmaker Stated Goal To be able to swallow without any problems     Pain Assessment   Currently in Pain? Yes   Pain Score 6    Pain Location Hand   Pain Orientation Right   Pain Type Chronic pain   Pain Onset More than a month ago   Pain Frequency Intermittent   Pain Relieving  Factors medication     General Information   HPI Pt with 2 recent hospitalizations 10/12/16 to 10/16/16 and 10/21/16 to 10/25/16 with DVT and syncope Pt. with PMH of acute DVT on Xarelto, recent Baker's cyst rupture, COPD, chronic ITP, seizures, BPH; admitted on 10/21/2016, presented with complaint of nausea vomiting and passing out episode, was found to have severe orthostatic hypotension.  PRIOR to initial hospitalization pt had OP MBSS on 10/10/16.   Pt exhibits a mild sensorimotor oropharyngeal dysphagia marked by mild and intermittent premature spill due to decreased oral control and cohesion. Sensory deficits impacted initiation of swallow evidenced by intermittent delay to pyriform sinuses. Laryngeal motor weakness resulted in mildly decreased laryngeal elevation and epiglottic inversion with penetration unsensed but effective throat clearing with cues for. No laryneal intrusion while performing a chin tuck over repeated trials. Unremarkable oropharyngeal and esophageal (MBS does not diagnose esophageal deficits below UES) transit with pill whole in applesauce. No difficulty with multi-consistency texture although he is at greater risk given mild oral deficits. Recommend regular texture (use caution with dry, crumbly food, multi-consistency), thin liquids using chin tuck strategy, pills whole in applesauce. Did not disuss with pt however if symptoms  are not improved by compensatory strategies and dysphagia worsens, he may benefit from outpatient ST for motor strengthening.    SLP Diet Recommendations: Regular solids;Thin liquid   Mobility Status walk with a cane and uses left ankle/foot brace     Prior Functional Status   Cognitive/Linguistic Baseline Baseline deficits   Baseline deficit details old right CVA   Type of Home House    Lives With Spouse   Available Support Personal care attendant   Vocation Retired     Associate Professor   Overall Cognitive Status Impaired/Different from baseline   Area of  Impairment Memory     Oral Motor/Sensory Function   Overall Oral Motor/Sensory Function Appears within functional limits for tasks assessed     Motor Speech   Overall Motor Speech Appears within functional limits for tasks assessed   Respiration Within functional limits   Phonation Normal                Thin Liquid - 12/12/16 1355      Thin Liquid   Thin Liquid Impaired   Presentation Cup   Pharyngeal  Phase Impairments Cough - Immediate   Other Comments inconsistent                 SLP Education - 12/12/16 1357    Education provided Yes   Education Details Swallow precautions, HEP for dysphagia, diet recommendations   Person(s) Educated Patient;Spouse   Methods Explanation;Demonstration;Handout   Comprehension Verbalized understanding;Verbal cues required;Need further instruction          SLP Short Term Goals - 12/12/16 1713      SLP SHORT TERM GOAL #1   Title Pt will follow swallow precautions with rare min A 9/10 trials   Time 4   Period Weeks   Status New     SLP SHORT TERM GOAL #2   Title Pt will perform HEP for dysphagia with occasional min A over 2 sessions   Time 4   Period Weeks   Status New     SLP SHORT TERM GOAL #3   Title Pt/spouse will verbalize s/s of aspiration pna   Time 4   Period Weeks   Status New          SLP Long Term Goals - 12/12/16 1714      SLP LONG TERM GOAL #1   Title Pt will follow swallow precautions with rare min A outside of therapy over 4 sessions per spouse report.   Time 8   Period Weeks   Status New     SLP LONG TERM GOAL #2   Title Pt will perform HEP for dysphagia with rare min A over 2 sessions   Time [place] - 12/12/16 1715    Clinical Impression Statement [name] is referred for outpt dysphagia therapy s/p MBSS on 10/10/16 -  see results above. Clinical bedside swallow revealed immediate cough after thin liquid sip indicating reduced airway  protection and pharyngeal dysphagia consistent with MBSS. [name] did not follow swallow precautions of chin tuck initially during this evaluation. His spouse reports he does not follow chin tuck or alternating solids/liquids at home, even when she cues him. They had some confusion on how to perform chin tuck. Although the MBSS was completed, prior to hospitalizations, pt and spouse report some deconditiontioning since hospitalization. I initiated training in chin tuck and pt demonstrated adequate  coordination of chin tuck with sip and swallow with usual mod A. Initiated training in HEP for dysphagia per results of MBSS.  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  Consider RMT I recommend skilled ST to maximize carryover of swallow precautions and safety of swallow.      Patient will benefit from skilled therapeutic intervention in order to improve the following deficits and impairments:   Dysphagia, oropharyngeal phase - Plan: SLP plan of care cert/re-cert      G-Codes - 66/06/30 1711    Functional Limitations Swallowing   Swallow Current Status (Z6010) At least 20 percent but less than 40 percent impaired, limited or restricted   Swallow Goal Status (X3235) At least 1 percent but less than 20 percent impaired, limited or restricted      Problem List Patient Active Problem List   Diagnosis Date Noted  . Pulmonary nodule 10/24/2016  . Acute hypoxemic respiratory failure ([place]) 10/21/2016  . Sepsis ([place]) 10/21/2016  . DVT (deep venous thrombosis) ([place]) 10/12/2016  . Supratherapeutic INR 10/12/2016  . Acute deep vein thrombosis (DVT) of popliteal vein of right lower extremity ([place]) 10/12/2016  . AKI (acute kidney injury) ([place]) 10/12/2016  . Fever 10/12/2016  . Leukocytosis 10/12/2016  . Bacteriuria 10/12/2016  . Deep vein thrombosis (DVT) ([place]) 03/02/2015  . Lupus anticoagulant disorder ([place]) 03/02/2015  . Ischemic stroke ([place]) 10/04/2014  . Carotid  artery obstruction 10/04/2014  . Deep vein thrombosis ([place]) 10/04/2014  . 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 LA (lupus anticoagulant) disorder ([place]) 10/04/2014  . Arteriosclerosis of coronary artery 09/10/2012  . Apnea, sleep 09/10/2012  . 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 Dermatophytic onychia 01/29/2012  . Spastic hemiplegia ([place]) 12/03/2011  . Hemiparesis, left ([place]) 10/31/2011  . Dysphonia 08/13/2011  . Idiopathic thrombocytopenic purpura ([place]) 08/12/2011  . ITP (idiopathic thrombocytopenic purpura) 08/12/2011    Shayma Pfefferle, Annye Rusk MS, CCC-SLP 12/12/2016, 5:17 PM  [facility] [address] [place], [place], [address] Phone: [phone]   Fax:  [phone]  Name: Mark [name] MRN: [id number] Date of Birth: 10-31-1945

## 2016-12-12 NOTE — Patient Instructions (Signed)
  Small bite, small sips  Fork down in between bites  Chin down, swallow liquid  Alternate solids and liquids  Chin tuck with all liquids  Sip, chin down, swallow  SWALLOWING EXERCISES 1. Effortful Swallows - Squeeze hard with the muscles in your neck while you swallow your  saliva or a sip of water - Repeat 20 times, 2-3 times a day, and use whenever you eat or drink  2. Masako Swallow - swallow with your tongue sticking out - Stick tongue out and gently bite tongue with your teeth - Swallow, while holding your tongue with your teeth - Repeat 20 times, 2-3 times a day  3. Pitch Raise - Repeat "he", once per second in as high of a pitch as you can - Repeat 20 times, 2-3 times a day  4. Shaker Exercise - head lift - Lie flat on your back in your bed or on a couch without pillows - Raise your head and look at your feet  - KEEP YOUR SHOULDERS DOWN - HOLD FOR 10 SECONDS, then lower your head back down - Repeat 3 times, 2-3 times a day  5. Mendelsohn Maneuver - "half swallow" exercise - Start to swallow, and keep your Adam's apple up by squeezing hard with the muscles of the throat - Hold the squeeze for 5-7 seconds and then relax - Repeat 20 times, 2-3 times a day  6. Tongue Press - Press your entire tongue as hard as you can against the roof of your mouth for 3-5 seconds - Repeat 20 times, 2-3 times a day  7. Tongue Stretch/Teeth Clean - Move your tongue around the pocket between your gums and teeth, clockwise and then counter-clockwise - Repeat on the back side, clockwise and then counter-clockwise - Repeat 15-20 times, 2-3 times a day  8. Breath Hold - Say "HUH!" loudly, holding your breath tightly at the level of your voice box for 3 seconds - Repeat 20 times, 2-3 times a day  9. Chin pushback - Open your mouth  - Place your fist UNDER your chin near your neck, and push back with your fist for 5 seconds  - Repeat 20 times, 2-3 times a day   10. Open mouth  swallow          -Swallow with your mouth open wide          -Repeat 15 times, 2 times a day   11. Pitch Glides

## 2016-12-18 DIAGNOSIS — H903 Sensorineural hearing loss, bilateral: Secondary | ICD-10-CM | POA: Diagnosis not present

## 2016-12-23 ENCOUNTER — Ambulatory Visit: Payer: Medicare HMO | Admitting: Physical Therapy

## 2016-12-23 ENCOUNTER — Ambulatory Visit: Payer: Medicare HMO | Admitting: Speech Pathology

## 2016-12-24 ENCOUNTER — Other Ambulatory Visit: Payer: Self-pay | Admitting: *Deleted

## 2016-12-24 DIAGNOSIS — I824Y9 Acute embolism and thrombosis of unspecified deep veins of unspecified proximal lower extremity: Secondary | ICD-10-CM

## 2016-12-24 DIAGNOSIS — D6862 Lupus anticoagulant syndrome: Secondary | ICD-10-CM

## 2016-12-24 MED ORDER — RIVAROXABAN 20 MG PO TABS: 20.0000 mg | ORAL_TABLET | Freq: Every day | ORAL | 6 refills | Status: DC

## 2016-12-25 ENCOUNTER — Ambulatory Visit: Payer: Medicare HMO | Admitting: Physical Therapy

## 2016-12-27 ENCOUNTER — Ambulatory Visit: Payer: Medicare HMO

## 2016-12-27 ENCOUNTER — Ambulatory Visit: Payer: Medicare HMO | Admitting: Physical Therapy

## 2016-12-30 ENCOUNTER — Ambulatory Visit: Payer: Medicare HMO | Admitting: Physical Therapy

## 2017-01-01 ENCOUNTER — Encounter: Payer: Self-pay | Admitting: Physical Therapy

## 2017-01-01 ENCOUNTER — Other Ambulatory Visit: Payer: Medicare Other

## 2017-01-01 ENCOUNTER — Ambulatory Visit: Payer: Medicare HMO | Attending: Family Medicine | Admitting: Physical Therapy

## 2017-01-01 DIAGNOSIS — R42 Dizziness and giddiness: Secondary | ICD-10-CM

## 2017-01-01 DIAGNOSIS — R296 Repeated falls: Secondary | ICD-10-CM | POA: Insufficient documentation

## 2017-01-01 DIAGNOSIS — R2681 Unsteadiness on feet: Secondary | ICD-10-CM | POA: Diagnosis not present

## 2017-01-01 DIAGNOSIS — R293 Abnormal posture: Secondary | ICD-10-CM | POA: Insufficient documentation

## 2017-01-01 DIAGNOSIS — R2689 Other abnormalities of gait and mobility: Secondary | ICD-10-CM | POA: Insufficient documentation

## 2017-01-01 DIAGNOSIS — R261 Paralytic gait: Secondary | ICD-10-CM | POA: Diagnosis not present

## 2017-01-01 DIAGNOSIS — R1312 Dysphagia, oropharyngeal phase: Secondary | ICD-10-CM | POA: Insufficient documentation

## 2017-01-02 NOTE — Therapy (Signed)
[facility] [address] [place], [place], [address] Phone: [phone]   Fax:  [phone]  Physical Therapy Treatment  Patient Details  Name: Mark [name] MRN: [id number] Date of Birth: November 14, 1945 Referring Provider: Harlan Stains, MD  Encounter Date: 01/01/2017      PT End of Session - 01/01/17 1954    Visit Number 3   Number of Visits 18   Date for PT Re-Evaluation 02/07/17   Authorization Type [employer] Medicare G-code & progress note   PT Start Time 1500   PT Stop Time 1530   PT Time Calculation (min) 30 min   Activity Tolerance Patient tolerated treatment well   Behavior During Therapy [facility] for tasks assessed/performed      Past Medical History:  Diagnosis Date  . Chronic ITP (idiopathic thrombocytopenia) (HCC)   . Coronary artery disease 1992   MI  . DVT (deep venous thrombosis) ([place])   . 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 Hypertension   . Lupus anticoagulant disorder ([place])   . Multiple closed anterior-posterior compression fractures of pelvis ([place])   . Seizures ([place])   . Stroke [facility]) 02/27/2011   Left side weakness    Past Surgical History:  Procedure Laterality Date  . blood clot Right    surgical removal  . CHOLECYSTECTOMY    . CORONARY ANGIOPLASTY  1992  . SPLENECTOMY, TOTAL    . vertebralplasty      There were no vitals filed for this visit.      Subjective Assessment - 01/01/17 1500    Subjective No falls. They got new velcro straps for AFO and they seem to help.    Patient is accompained by: Family member   Pertinent History CVA spastic hemiplegia, CAD/MI, depression, ADHD, osteoporosis, HTN, lupus, epilepsy, sz, hearing loss,   Limitations Lifting;Standing;Walking;House hold activities   Patient Stated Goals to return his activity level to prior level   Currently in Pain? Yes   Pain Score 6    Pain Location Hip   Pain Orientation Right   Pain Descriptors / Indicators Aching   Pain Type  Chronic pain   Pain Onset More than a month ago   Pain Frequency Intermittent   Aggravating Factors  walking   Pain Relieving Factors resting,                               Balance Exercises - 01/01/17 1300      OTAGO PROGRAM   Head Movements Sitting;5 reps   Neck Movements 5 reps;Sitting   Back Extension Standing;5 reps  UE support   Trunk Movements 5 reps;Standing  UE support   Ankle Movements Sitting;10 reps  AFO straps released for ankle motion   Knee Extensor 10 reps  alt LEs   Knee Flexor 10 reps  standing with UE support alt. LEs   Hip ABductor 10 reps  standing with UE support alt. LEs   Ankle Plantorflexors 20 reps, support  AFO straps released   Ankle Dorsiflexors 20 reps, support  AFO straps released   Knee Bends 10 reps, support  chair behind for safety & UE support           PT Education - 01/01/17 1530    Education provided Yes   Education Details reviewed initial OTAGO exercises   Person(s) Educated Patient   Methods Explanation;Demonstration;Tactile cues;Verbal cues;Handout   Comprehension Returned demonstration;Verbalized understanding;Verbal cues required;Tactile cues  required;Need further instruction          PT Short Term Goals - 12/12/16 2026      PT SHORT TERM GOAL #1   Title Patient ambulates around furniture with cane & AFO performing naming tasks without balance loss.    Time 1   Period Months   Status On-going   Target Date 12/08/16     PT SHORT TERM GOAL #2   Title Patient & wife verbalize understanding of orthostatic hypotension management techniques.    Time 1   Period Months   Status New     PT SHORT TERM GOAL #3   Title Pt will demonstrate understanding of initial HEP.    Time 1   Period Months   Status On-going   Target Date 12/08/16     PT SHORT TERM GOAL #4   Title Patient's gait velocity with AFO & cane 1.7 ft/sec   Time 1   Period Months   Status On-going   Target Date 12/08/16            PT Long Term Goals - 12/12/16 2026      PT LONG TERM GOAL #1   Title Patient and wife demonstrate and verbalize understanding of ongoing fitness plan.     Time 2   Period Months   Status On-going   Target Date 02/07/17     PT LONG TERM GOAL #2   Title Patient ambulates 300' on outside surfaces including grass with supervision with AFO & assistive device.    Time 2   Period Months   Status On-going   Target Date 02/07/17     PT LONG TERM GOAL #3   Title Patient negotiates ramps, curbs and stairs (1 rail) with AFO & assistive device with supervision.      Time 2   Period Months   Status On-going   Target Date 02/07/17     PT LONG TERM GOAL #4   Title Dynamic Gait Index >/= 12/24 with cane & AFO to indicate lower fall risk.    Time 2   Period Months   Status On-going   Target Date 02/07/17     PT LONG TERM GOAL #5   Title Timed Up & Go with cane <13.5 sec and cognitive TUG <16 sec to indicate lower fall risk.    Time 2   Period Months   Status On-going   Target Date 02/07/17               Plan - 01/01/17 1854    Clinical Impression Statement Patient and wife appear to understand initial portions of HEP but needs additional instructions. Patient and wife verbalized understanding of Orthostatic Hypotension/.    Rehab Potential Good   PT Frequency 2x / week   PT Duration Other (comment)  9 weeks (60 days)   PT Treatment/Interventions ADLs/Self Care Home Management;DME Instruction;Gait training;Stair training;Functional mobility training;Therapeutic activities;Therapeutic exercise;Balance training;Neuromuscular re-education;Patient/family education;Orthotic Fit/Training   PT Next Visit Plan Assess old HEP & initiate updated HEP,    Consulted and Agree with Plan of Care Patient;Family member/caregiver   Family Member Consulted wife      Patient will benefit from skilled therapeutic intervention in order to improve the following deficits and  impairments:  Abnormal gait, Decreased activity tolerance, Decreased balance, Decreased coordination, Decreased endurance, Decreased knowledge of use of DME, Decreased mobility, Decreased range of motion, Decreased strength, Impaired flexibility, Impaired UE functional use, Postural dysfunction, Impaired tone  Visit Diagnosis:  Other abnormalities of gait and mobility  Unsteadiness on feet  Paralytic gait  Dizziness and giddiness  Repeated falls     Problem List Patient Active Problem List   Diagnosis Date Noted  . Pulmonary nodule 10/24/2016  . Acute hypoxemic respiratory failure ([place]) 10/21/2016  . Sepsis ([place]) 10/21/2016  . DVT (deep venous thrombosis) ([place]) 10/12/2016  . Supratherapeutic INR 10/12/2016  . Acute deep vein thrombosis (DVT) of popliteal vein of right lower extremity ([place]) 10/12/2016  . AKI (acute kidney injury) ([place]) 10/12/2016  . Fever 10/12/2016  . Leukocytosis 10/12/2016  . Bacteriuria 10/12/2016  . Deep vein thrombosis (DVT) ([place]) 03/02/2015  . Lupus anticoagulant disorder ([place]) 03/02/2015  . Ischemic stroke ([place]) 10/04/2014  . 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 Deep vein thrombosis ([place]) 10/04/2014  . 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 LA (lupus anticoagulant) disorder ([place]) 10/04/2014  . Arteriosclerosis of coronary artery 09/10/2012  . Apnea, sleep 09/10/2012  . 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 Dermatophytic onychia 01/29/2012  . Spastic hemiplegia ([place]) 12/03/2011  . Hemiparesis, left ([place]) 10/31/2011  . Dysphonia 08/13/2011  . Idiopathic thrombocytopenic purpura ([place]) 08/12/2011  . ITP (idiopathic thrombocytopenic purpura) 08/12/2011    Jamey Reas PT, DPT 01/02/2017, 1:57 PM  [facility] [address] [place], [place], [address] Phone: [phone]   Fax:  [phone]  Name: JAKEN FREGIA MRN: [id number] Date of Birth: 1945/07/07

## 2017-01-06 ENCOUNTER — Ambulatory Visit: Payer: Medicare HMO

## 2017-01-06 ENCOUNTER — Encounter: Payer: Self-pay | Admitting: Physical Therapy

## 2017-01-06 ENCOUNTER — Ambulatory Visit: Payer: Medicare HMO | Admitting: Physical Therapy

## 2017-01-06 DIAGNOSIS — R296 Repeated falls: Secondary | ICD-10-CM | POA: Diagnosis not present

## 2017-01-06 DIAGNOSIS — R42 Dizziness and giddiness: Secondary | ICD-10-CM

## 2017-01-06 DIAGNOSIS — R2689 Other abnormalities of gait and mobility: Secondary | ICD-10-CM | POA: Diagnosis not present

## 2017-01-06 DIAGNOSIS — R293 Abnormal posture: Secondary | ICD-10-CM | POA: Diagnosis not present

## 2017-01-06 DIAGNOSIS — R2681 Unsteadiness on feet: Secondary | ICD-10-CM

## 2017-01-06 DIAGNOSIS — R1312 Dysphagia, oropharyngeal phase: Secondary | ICD-10-CM

## 2017-01-06 DIAGNOSIS — R261 Paralytic gait: Secondary | ICD-10-CM | POA: Diagnosis not present

## 2017-01-06 NOTE — Therapy (Signed)
[facility] [address] [place], [place], [address] Phone: [phone]   Fax:  [phone]  Speech Language Pathology Treatment  Patient Details  Name: Mark [name] MRN: [id number] Date of Birth: [birth year] Referring Provider: Dr. Harlan Stains  Encounter Date: 01/06/2017      End of Session - 01/06/17 1626    Visit Number 2   Number of Visits 17   Date for SLP Re-Evaluation 02/07/17   SLP Start Time 7619   SLP Stop Time  1615   SLP Time Calculation (min) 37 min   Activity Tolerance Patient tolerated treatment well      Past Medical History:  Diagnosis Date  . Chronic ITP (idiopathic thrombocytopenia) (HCC)   . Coronary artery disease 1992   MI  . DVT (deep venous thrombosis) ([place])   . 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 Hypertension   . Lupus anticoagulant disorder ([place])   . Multiple closed anterior-posterior compression fractures of pelvis ([place])   . Seizures ([place])   . Stroke [facility]) 02/27/2011   Left side weakness    Past Surgical History:  Procedure Laterality Date  . blood clot Right    surgical removal  . CHOLECYSTECTOMY    . CORONARY ANGIOPLASTY  1992  . SPLENECTOMY, TOTAL    . vertebralplasty      There were no vitals filed for this visit.      Subjective Assessment - 01/06/17 1543    Subjective Pt seven minutes late to [place]. "I forgot that one" (re: 5 exercises prescribed)   Patient is accompained by: Family member  wife               ADULT SLP TREATMENT - 01/06/17 1552      General Information   Behavior/Cognition Alert;Cooperative;Pleasant mood     Treatment Provided   Treatment provided Dysphagia     Dysphagia Treatment   Temperature Spikes Noted No   Respiratory Status Room air   Treatment Methods Therapeutic exercise;Skilled observation;Compensation strategy training;Patient/caregiver education   Patient observed directly with PO's Yes   Type of PO's observed Dysphagia 3  (soft);Thin liquids   Feeding Needs assist  with precautions   Liquids provided via Cup   Oral Phase Signs & Symptoms --  none   Pharyngeal Phase Signs & Symptoms --  none with use of compensations   Type of cueing Verbal   Amount of cueing Moderate  faded to modified independent   Other treatment/comments POs: pt req'd SLP reminders with chin tuck initially, then was modified independent after that. With HEP, pt req'd mod A usually for proper completion. Pt agreed he was completing approx half exercises once a day. SLP reiterated that HEP is "medicine" and dosage was BID. If pt performing less than prescribed amount, symptoms/deficits will not improve. Due to pt endorsing forgetfulness with regards to some exercises, SLP suggested pt make a checklist for all exercises to check off each time he completes, twice a day.     Pain Assessment   Pain Assessment 0-10   Pain Score 3    Pain Location rt hip   Pain Descriptors / Indicators Aching   Pain Intervention(s) Monitored during session     Assessment / Recommendations / Plan   Plan Continue with current plan of care     Dysphagia Recommendations   Diet recommendations Regular;Thin liquid   Liquids provided via Cup   Medication Administration Whole meds with puree   Supervision Intermittent supervision  to cue for compensatory strategies   Compensations Small sips/bites;Follow solids with liquid  fork down between bites   Postural Changes and/or Swallow Maneuvers Chin tuck  with liquids     Progression Toward Goals   Progression toward goals Progressing toward goals          SLP Education - 01/06/17 1626    Education provided Yes   Education Details procedure of HEP, HEP as "medicine", checklist to track completion of exercises   Person(s) Educated Patient   Methods Explanation;Demonstration;Verbal cues;Handout   Comprehension Verbalized understanding;Returned demonstration;Verbal cues required;Need further instruction           SLP Short Term Goals - 01/06/17 1538      SLP SHORT TERM GOAL #1   Title Pt will follow swallow precautions with rare min A 9/10 trials   Time 4   Period Weeks   Status On-going     SLP SHORT TERM GOAL #2   Title Pt will perform HEP for dysphagia with occasional min A over 2 sessions   Time 4   Period Weeks   Status On-going     SLP SHORT TERM GOAL #3   Title Pt/spouse will verbalize s/s of aspiration pna   Time 4   Period Weeks   Status On-going          SLP Long Term Goals - 01/06/17 1538      SLP LONG TERM GOAL #1   Title Pt will follow swallow precautions with rare min A outside of therapy over 4 sessions per spouse report.   Time 8   Period Weeks   Status On-going     SLP LONG TERM GOAL #2   Title Pt will perform HEP for dysphagia with rare min A over 2 sessions   Time 8   Period Weeks   Status On-going          Plan - 01/06/17 1639    Clinical Impression Statement SLP targeted accuracy of HEP with pt today and he re'd cues from SLP usually. Pt noncompliant with frequency and scope/breadth of HEP - reminded pt to complete as directed. Pt initially req'd SLP A with chin tuck, but was independent after one reminder. SLP suggested to wife no distractions (TV, conversations, paper) during mealtimes until chin tuck becomes habitual. Skilled ST remains necessary for improving safety while eating.    Speech Therapy Frequency 2x / week   Duration --  17 sessions or 8 weeks   Treatment/Interventions Aspiration precaution training;Pharyngeal strengthening exercises;Diet toleration management by SLP;Environmental controls;Compensatory techniques;Internal/external aids;SLP instruction and feedback;Patient/family education   Potential to Achieve Goals Good   Potential Considerations Co-morbidities      Patient will benefit from skilled therapeutic intervention in order to improve the following deficits and impairments:   Dysphagia, oropharyngeal  phase    Problem List Patient Active Problem List   Diagnosis Date Noted  . Pulmonary nodule 10/24/2016  . Acute hypoxemic respiratory failure ([place]) 10/21/2016  . Sepsis ([place]) 10/21/2016  . DVT (deep venous thrombosis) ([place]) 10/12/2016  . Supratherapeutic INR 10/12/2016  . Acute deep vein thrombosis (DVT) of popliteal vein of right lower extremity ([place]) 10/12/2016  . AKI (acute kidney injury) ([place]) 10/12/2016  . Fever 10/12/2016  . Leukocytosis 10/12/2016  . Bacteriuria 10/12/2016  . Deep vein thrombosis (DVT) ([place]) 03/02/2015  . Lupus anticoagulant disorder ([place]) 03/02/2015  . Ischemic stroke ([place]) 10/04/2014  . 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 Deep vein thrombosis ([place]) 10/04/2014  .  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 LA (lupus anticoagulant) disorder ([place]) 10/04/2014  . Arteriosclerosis of coronary artery 09/10/2012  . Apnea, sleep 09/10/2012  . 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 Dermatophytic onychia 01/29/2012  . Spastic hemiplegia ([place]) 12/03/2011  . Hemiparesis, left ([place]) 10/31/2011  . Dysphonia 08/13/2011  . Idiopathic thrombocytopenic purpura ([place]) 08/12/2011  . ITP (idiopathic thrombocytopenic purpura) 08/12/2011    [facility] ,MS, CCC-SLP  01/06/2017, 4:53 PM  [facility] [address] [place], [place], [address] Phone: [phone]   Fax:  [phone]   Name: GRADY MOHABIR MRN: [id number] Date of Birth: 10-Aug-1945

## 2017-01-06 NOTE — Therapy (Signed)
[facility] [address] [place], [place], [address] Phone: [phone]   Fax:  [phone]  Physical Therapy Treatment  Patient Details  Name: Mark [name] MRN: [id number] Date of Birth: 1945-08-05 Referring Provider: Harlan Stains, MD  Encounter Date: 01/06/2017      PT End of Session - 01/06/17 2308    Visit Number 4   Number of Visits 18   Date for PT Re-Evaluation 02/07/17   Authorization Type [employer] Medicare G-code & progress note   PT Start Time 1616   PT Stop Time 1704   PT Time Calculation (min) 48 min   Activity Tolerance Patient tolerated treatment well   Behavior During Therapy [facility] for tasks assessed/performed      Past Medical History:  Diagnosis Date  . Chronic ITP (idiopathic thrombocytopenia) (HCC)   . Coronary artery disease 1992   MI  . DVT (deep venous thrombosis) ([place])   . 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 Hypertension   . Lupus anticoagulant disorder ([place])   . Multiple closed anterior-posterior compression fractures of pelvis ([place])   . Seizures ([place])   . Stroke [facility]) 02/27/2011   Left side weakness    Past Surgical History:  Procedure Laterality Date  . blood clot Right    surgical removal  . CHOLECYSTECTOMY    . CORONARY ANGIOPLASTY  1992  . SPLENECTOMY, TOTAL    . vertebralplasty      There were no vitals filed for this visit.      Subjective Assessment - 01/06/17 1622    Subjective No falls. He walked 50yds out & back but was so tired it was hard to get into house.    Patient is accompained by: Family member   Pertinent History CVA spastic hemiplegia, CAD/MI, depression, ADHD, osteoporosis, HTN, lupus, epilepsy, sz, hearing loss,   Limitations Lifting;Standing;Walking;House hold activities   Patient Stated Goals to return his activity level to prior level   Currently in Pain? Yes   Pain Score 8   2-3 sitting, 8 walking   Pain Location Hip   Pain Orientation Right   Pain  Descriptors / Indicators Aching   Pain Type Chronic pain   Pain Onset More than a month ago   Pain Frequency Intermittent   Aggravating Factors  walking    Pain Relieving Factors resting                         [facility] Adult PT Treatment/Exercise - 01/06/17 1615      Exercises   Exercises Knee/Hip     Knee/Hip Exercises: Stretches   Passive Hamstring Stretch Right;Both;2 reps;30 seconds   Passive Hamstring Stretch Limitations seated with LE extended   ITB Stretch Right;2 reps;30 seconds   ITB Stretch Limitations supine    Piriformis Stretch Right;2 reps;30 seconds   Piriformis Stretch Limitations supine & sitting with chair ant to assist.    Gastroc Stretch Right;Left;2 reps;30 seconds   Gastroc Stretch Limitations use looped sheet   Other Knee/Hip Stretches combined hamstring / gastroc seated at edge with knee ext with looped sheet. 30 sec hold 2 reps. each.      Manual Therapy   Manual Therapy Other (comment)   Other Manual Therapy roller  on ITB for stripping             Balance Exercises - 01/06/17 1615      OTAGO PROGRAM   Head Movements 5 reps;Sitting  Neck Movements 5 reps;Sitting   Back Extension 5 reps;Standing  UE support   Trunk Movements 5 reps;Standing  UE support   Ankle Movements Sitting;10 reps   Knee Extensor 10 reps   Knee Flexor 10 reps  standing with UE support   Hip ABductor 10 reps  standing with UE support   Ankle Plantorflexors 20 reps, support   Ankle Dorsiflexors 20 reps, support   Knee Bends 10 reps, support           PT Education - 01/06/17 1630    Education provided Yes   Education Details PT instructed in need to limit gait distances and progress distance / time more gradually; add piriformis & ITB stretches to HEP. Use of frozen water ball to strip ITB. Positioning in right sidelying with pillows.     Person(s) Educated Patient;Spouse   Methods Explanation;Demonstration;Tactile cues;Verbal cues;Handout    Comprehension Verbalized understanding;Returned demonstration;Verbal cues required;Tactile cues required;Need further instruction          PT Short Term Goals - 12/12/16 2026      PT SHORT TERM GOAL #1   Title Patient ambulates around furniture with cane & AFO performing naming tasks without balance loss.    Time 1   Period Months   Status On-going   Target Date 12/08/16     PT SHORT TERM GOAL #2   Title Patient & wife verbalize understanding of orthostatic hypotension management techniques.    Time 1   Period Months   Status New     PT SHORT TERM GOAL #3   Title Pt will demonstrate understanding of initial HEP.    Time 1   Period Months   Status On-going   Target Date 12/08/16     PT SHORT TERM GOAL #4   Title Patient's gait velocity with AFO & cane 1.7 ft/sec   Time 1   Period Months   Status On-going   Target Date 12/08/16           PT Long Term Goals - 12/12/16 2026      PT LONG TERM GOAL #1   Title Patient and wife demonstrate and verbalize understanding of ongoing fitness plan.     Time 2   Period Months   Status On-going   Target Date 02/07/17     PT LONG TERM GOAL #2   Title Patient ambulates 300' on outside surfaces including grass with supervision with AFO & assistive device.    Time 2   Period Months   Status On-going   Target Date 02/07/17     PT LONG TERM GOAL #3   Title Patient negotiates ramps, curbs and stairs (1 rail) with AFO & assistive device with supervision.      Time 2   Period Months   Status On-going   Target Date 02/07/17     PT LONG TERM GOAL #4   Title Dynamic Gait Index >/= 12/24 with cane & AFO to indicate lower fall risk.    Time 2   Period Months   Status On-going   Target Date 02/07/17     PT LONG TERM GOAL #5   Title Timed Up & Go with cane <13.5 sec and cognitive TUG <16 sec to indicate lower fall risk.    Time 2   Period Months   Status On-going   Target Date 02/07/17               Plan -  01/06/17 2310  Clinical Impression Statement Patient and wife appear to understand need to not increase gait distances / time too rapidly. Patient appears to have increased Piriformis & ITB pain & tightness. His wife appears to understand stretches & positioning.    Rehab Potential Good   PT Frequency 2x / week   PT Duration Other (comment)  9 weeks (60 days)   PT Treatment/Interventions ADLs/Self Care Home Management;DME Instruction;Gait training;Stair training;Functional mobility training;Therapeutic activities;Therapeutic exercise;Balance training;Neuromuscular re-education;Patient/family education;Orthotic Fit/Training   PT Next Visit Plan Continue to review HEP & update as indicated.    Consulted and Agree with Plan of Care Patient;Family member/caregiver   Family Member Consulted wife      Patient will benefit from skilled therapeutic intervention in order to improve the following deficits and impairments:  Abnormal gait, Decreased activity tolerance, Decreased balance, Decreased coordination, Decreased endurance, Decreased knowledge of use of DME, Decreased mobility, Decreased range of motion, Decreased strength, Impaired flexibility, Impaired UE functional use, Postural dysfunction, Impaired tone  Visit Diagnosis: Other abnormalities of gait and mobility  Unsteadiness on feet  Paralytic gait  Dizziness and giddiness  Repeated falls  Abnormal posture     Problem List Patient Active Problem List   Diagnosis Date Noted  . Pulmonary nodule 10/24/2016  . Acute hypoxemic respiratory failure ([place]) 10/21/2016  . Sepsis ([place]) 10/21/2016  . DVT (deep venous thrombosis) ([place]) 10/12/2016  . Supratherapeutic INR 10/12/2016  . Acute deep vein thrombosis (DVT) of popliteal vein of right lower extremity ([place]) 10/12/2016  . AKI (acute kidney injury) ([place]) 10/12/2016  . Fever 10/12/2016  . Leukocytosis 10/12/2016  . Bacteriuria 10/12/2016  . Deep vein thrombosis (DVT) ([place])  03/02/2015  . Lupus anticoagulant disorder ([place]) 03/02/2015  . Ischemic stroke ([place]) 10/04/2014  . 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 Deep vein thrombosis ([place]) 10/04/2014  . 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 LA (lupus anticoagulant) disorder ([place]) 10/04/2014  . Arteriosclerosis of coronary artery 09/10/2012  . Apnea, sleep 09/10/2012  . 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 Dermatophytic onychia 01/29/2012  . Spastic hemiplegia ([place]) 12/03/2011  . Hemiparesis, left ([place]) 10/31/2011  . Dysphonia 08/13/2011  . Idiopathic thrombocytopenic purpura ([place]) 08/12/2011  . ITP (idiopathic thrombocytopenic purpura) 08/12/2011    Jamey Reas PT, DPT 01/06/2017, 11:18 PM  [facility] [address] [place], [place], [address] Phone: [phone]   Fax:  [phone]  Name: DELANCE WEIDE MRN: [id number] Date of Birth: 11/30/1945

## 2017-01-06 NOTE — Patient Instructions (Addendum)
Piriformis Stretch, Sitting    Sit, one ankle on opposite knee or on chair edge same-side hand on crossed knee. Push down on knee, keeping spine straight. Lean torso forward, with flat back, until tension is felt in hamstrings and gluteals of crossed-leg side. Hold _15-30__ seconds.  Repeat _2-3__ times per leg per session. Do _1-2__ sessions per day.  Copyright  VHI. All rights reserved.  Piriformis Stretch, Supine    Lie supine on back, bend left leg on bed and place right ankle crossed onto opposite knee. Gently bring left knee up until feels stretch in right hip. Roll knee towards top-leg side. Feel stretch in hip or pelvic region. Hold _15-30__ seconds.  Repeat __2-3_ times per session. Do __1-2_ sessions per day.  Copyright  VHI. All rights reserved.  Iliotibial Band Stretch, Supine    Lie supine, right leg straight, other leg bent, left foot on outside of straight right knee. Slowly and gently use foot of bent leg to move straight leg inward. Hold __15-30_ seconds.  Repeat _1-2__ times per leg per session. Do _1-2__ sessions per day.  Copyright  VHI. All rights reserved.

## 2017-01-06 NOTE — Patient Instructions (Signed)
   Use a wet spoon (dunk, not dip or scoop) to make a swallow easier with [place], Mendelsohn, and open mouth swallow.  Number of seconds on [place] was changed to 45-60 seconds.

## 2017-01-08 ENCOUNTER — Ambulatory Visit: Payer: Medicare HMO | Admitting: Physical Therapy

## 2017-01-08 ENCOUNTER — Ambulatory Visit: Payer: Medicare HMO

## 2017-01-08 ENCOUNTER — Encounter: Payer: Self-pay | Admitting: Physical Therapy

## 2017-01-08 DIAGNOSIS — R42 Dizziness and giddiness: Secondary | ICD-10-CM | POA: Diagnosis not present

## 2017-01-08 DIAGNOSIS — R2689 Other abnormalities of gait and mobility: Secondary | ICD-10-CM

## 2017-01-08 DIAGNOSIS — R1312 Dysphagia, oropharyngeal phase: Secondary | ICD-10-CM

## 2017-01-08 DIAGNOSIS — R2681 Unsteadiness on feet: Secondary | ICD-10-CM | POA: Diagnosis not present

## 2017-01-08 DIAGNOSIS — R296 Repeated falls: Secondary | ICD-10-CM | POA: Diagnosis not present

## 2017-01-08 DIAGNOSIS — R261 Paralytic gait: Secondary | ICD-10-CM | POA: Diagnosis not present

## 2017-01-08 DIAGNOSIS — R293 Abnormal posture: Secondary | ICD-10-CM | POA: Diagnosis not present

## 2017-01-08 NOTE — Therapy (Signed)
[facility] [address] [place], [place], [address] Phone: [phone]   Fax:  [phone]  Speech Language Pathology Treatment  Patient Details  Name: Mark [name] MRN: [id number] Date of Birth: 11-21-1945 Referring Provider: Dr. Harlan Stains  Encounter Date: 01/08/2017      End of Session - 01/08/17 1635    Visit Number 3   Number of Visits 17   Date for SLP Re-Evaluation 02/07/17   SLP Start Time 7371   SLP Stop Time  1615   SLP Time Calculation (min) 42 min   Activity Tolerance Patient tolerated treatment well      Past Medical History:  Diagnosis Date  . Chronic ITP (idiopathic thrombocytopenia) (HCC)   . Coronary artery disease 1992   MI  . DVT (deep venous thrombosis) ([place])   . 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 Hypertension   . Lupus anticoagulant disorder ([place])   . Multiple closed anterior-posterior compression fractures of pelvis ([place])   . Seizures ([place])   . Stroke [facility]) 02/27/2011   Left side weakness    Past Surgical History:  Procedure Laterality Date  . blood clot Right    surgical removal  . CHOLECYSTECTOMY    . CORONARY ANGIOPLASTY  1992  . SPLENECTOMY, TOTAL    . vertebralplasty      There were no vitals filed for this visit.      Subjective Assessment - 01/08/17 1542    Subjective Pt recalled 8 of his HEP exercises to SLP. "I love your analogy that this is the medicine."   Patient is accompained by: Family member  wife               ADULT SLP TREATMENT - 01/08/17 1558      General Information   Behavior/Cognition Alert;Cooperative;Pleasant mood;Impulsive  mild impulsivity     Treatment Provided   Treatment provided Dysphagia     Dysphagia Treatment   Temperature Spikes Noted No   Respiratory Status Room air   Treatment Methods Therapeutic exercise;Skilled observation   Patient observed directly with PO's Yes   Type of PO's observed Dysphagia 3 (soft);Thin liquids   Feeding Needs assist  with precautions   Liquids provided via Cup   Oral Phase Signs & Symptoms --  none   Pharyngeal Phase Signs & Symptoms --  none   Type of cueing Verbal   Amount of cueing Minimal  for swallowing solids prior to liquid presentation   Other treatment/comments HEP: pt told SLP 8 of his HEP exercises out of 11. He req'd min A occasionally for procedure. MEndelsohn and open mouth swallow were most difficult for pt to achieve. Pt req'd cues for swalloiwng solids prior to liquid administration and was indpendent after this. All precuations were followed, wife states pt forgets re: small bites "sometimes" at home. SLP told pt LTGs and he agreed. Pt m     Assessment / Recommendations / Plan   Plan Continue with current plan of care     Dysphagia Recommendations   Diet recommendations Regular;Thin liquid   Liquids provided via Cup   Medication Administration Whole meds with puree   Supervision Intermittent supervision to cue for compensatory strategies   Compensations Small sips/bites;Follow solids with liquid   Postural Changes and/or Swallow Maneuvers Chin tuck     Progression Toward Goals   Progression toward goals Progressing toward goals          SLP Education - 01/08/17 1635  Education provided Yes   Education Details LTGs   Person(s) Educated Patient;Spouse   Methods Explanation   Comprehension Verbalized understanding          SLP Short Term Goals - 01/08/17 1638      SLP SHORT TERM GOAL #1   Title Pt will follow swallow precautions with rare min A 9/10 trials over three visits   Baseline 01-08-17   Time 4   Status Revised     SLP SHORT TERM GOAL #2   Title Pt will perform HEP for dysphagia with occasional min A over 2 sessions   Baseline 01-08-17   Time 4   Period Weeks   Status On-going     SLP SHORT TERM GOAL #3   Title Pt/spouse will verbalize s/s of aspiration pna   Time 4   Period Weeks   Status On-going          SLP Long  Term Goals - 01/08/17 1638      SLP LONG TERM GOAL #1   Title Pt will follow swallow precautions with rare min A outside of therapy over 4 sessions per spouse report.   Time 8   Period Weeks   Status On-going     SLP LONG TERM GOAL #2   Title Pt will perform HEP for dysphagia with rare min A over 2 sessions   Time 8   Period Weeks   Status On-going          Plan - 01/08/17 1635    Clinical Impression Statement SLP targeted accuracy of HEP again with pt today and he req'd less cueing than Monday from SLP (occasional, min). Pt's compliance improved since last session, as demonstrated by pt able to name/recall 8/11 exercises spontaneously. Pt was independent with precautions after one reminder to ensure best swallow safety possible and swallow solids prior to liquid administration. Wife reports pt forgets small bites of food at times, but otherwise does well with precautions at home. Skilled ST remains necessary for improving safety while eating.    Speech Therapy Frequency 2x / week   Duration --  17 sessions or 8 weeks   Treatment/Interventions Aspiration precaution training;Pharyngeal strengthening exercises;Diet toleration management by SLP;Environmental controls;Compensatory techniques;Internal/external aids;SLP instruction and feedback;Patient/family education   Potential to Achieve Goals Good   Potential Considerations Co-morbidities      Patient will benefit from skilled therapeutic intervention in order to improve the following deficits and impairments:   Dysphagia, oropharyngeal phase    Problem List Patient Active Problem List   Diagnosis Date Noted  . Pulmonary nodule 10/24/2016  . Acute hypoxemic respiratory failure ([place]) 10/21/2016  . Sepsis ([place]) 10/21/2016  . DVT (deep venous thrombosis) ([place]) 10/12/2016  . Supratherapeutic INR 10/12/2016  . Acute deep vein thrombosis (DVT) of popliteal vein of right lower extremity ([place]) 10/12/2016  . AKI (acute kidney injury)  ([place]) 10/12/2016  . Fever 10/12/2016  . Leukocytosis 10/12/2016  . Bacteriuria 10/12/2016  . Deep vein thrombosis (DVT) ([place]) 03/02/2015  . Lupus anticoagulant disorder ([place]) 03/02/2015  . Ischemic stroke ([place]) 10/04/2014  . 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 Deep vein thrombosis ([place]) 10/04/2014  . 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 LA (lupus anticoagulant) disorder ([place]) 10/04/2014  . Arteriosclerosis of coronary artery 09/10/2012  . Apnea, sleep 09/10/2012  . 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 Dermatophytic onychia 01/29/2012  . Spastic hemiplegia ([place]) 12/03/2011  . Hemiparesis, left ([place]) 10/31/2011  . Dysphonia 08/13/2011  . Idiopathic thrombocytopenic purpura (  [place]) 08/12/2011  . ITP (idiopathic thrombocytopenic purpura) 08/12/2011    [facility] ,MS, CCC-SLP  01/08/2017, 4:39 PM  [facility] [address] [place], [place], [address] Phone: [phone]   Fax:  [phone]   Name: Mark [name] MRN: [id number] Date of Birth: 1945-12-16

## 2017-01-08 NOTE — Therapy (Signed)
[facility] [address] [place], [place], [address] Phone: [phone]   Fax:  [phone]  Physical Therapy Treatment  Patient Details  Name: Mark [name] MRN: [id number] Date of Birth: February 01, 1946 Referring Provider: Harlan Stains, MD  Encounter Date: 01/08/2017      PT End of Session - 01/08/17 2240    Visit Number 5   Number of Visits 18   Date for PT Re-Evaluation 02/07/17   Authorization Type AETNA Medicare G-code & progress note   PT Start Time 1408   PT Stop Time 1446   PT Time Calculation (min) 38 min   Activity Tolerance Patient tolerated treatment well   Behavior During Therapy [facility] for tasks assessed/performed      Past Medical History:  Diagnosis Date  . Chronic ITP (idiopathic thrombocytopenia) (HCC)   . Coronary artery disease 1992   MI  . DVT (deep venous thrombosis) ([place])   . 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 Hypertension   . Lupus anticoagulant disorder ([place])   . Multiple closed anterior-posterior compression fractures of pelvis ([place])   . Seizures ([place])   . Stroke [facility]) 02/27/2011   Left side weakness    Past Surgical History:  Procedure Laterality Date  . blood clot Right    surgical removal  . CHOLECYSTECTOMY    . CORONARY ANGIOPLASTY  1992  . SPLENECTOMY, TOTAL    . vertebralplasty      There were no vitals filed for this visit.      Subjective Assessment - 01/08/17 1414    Subjective No falls. They have questions for PT regarding his exercises.     Patient is accompained by: Family member   Pertinent History CVA spastic hemiplegia, CAD/MI, depression, ADHD, osteoporosis, HTN, lupus, epilepsy, sz, hearing loss,   Limitations Lifting;Standing;Walking;House hold activities   Patient Stated Goals to return his activity level to prior level   Currently in Pain? Yes   Pain Score 8   8/10 walking, 2-3 /10 sitting   Pain Location Hip   Pain Orientation Right   Pain Descriptors /  Indicators Aching   Pain Type Chronic pain   Pain Onset More than a month ago   Pain Frequency Intermittent   Aggravating Factors  walking       Therapeutic Exercise: PT reviewed OTAGO for first 50% of program and Seated hamstring & gastroc stretches. PT reviewed Piriformis stretch seated modified with chair in front and supine with assisted knee to chest. Pt's wife return demo & pt/wife verbalized understanding . PT reviewed supine ITB stretch with LE adducted. PT recommended performing ITB stripping with frozen water bottle while positioned in this stretch. Pt's wife verbalized understanding.                              PT Short Term Goals - 01/08/17 2241      PT SHORT TERM GOAL #1   Title Patient ambulates around furniture with cane & AFO performing naming tasks without balance loss.    Time 1   Period Months   Status On-going     PT SHORT TERM GOAL #2   Title Patient & wife verbalize understanding of orthostatic hypotension management techniques.    Baseline MET 01/08/2017   Time 1   Period Months   Status Achieved     PT SHORT TERM GOAL #3   Title Pt will demonstrate understanding of initial HEP.  Baseline MET 01/08/2017   Time 1   Period Months   Status Achieved     PT SHORT TERM GOAL #4   Title Patient's gait velocity with AFO & cane 1.7 ft/sec   Time 1   Period Months   Status On-going           PT Long Term Goals - 12/12/16 2026      PT LONG TERM GOAL #1   Title Patient and wife demonstrate and verbalize understanding of ongoing fitness plan.     Time 2   Period Months   Status On-going   Target Date 02/07/17     PT LONG TERM GOAL #2   Title Patient ambulates 300' on outside surfaces including grass with supervision with AFO & assistive device.    Time 2   Period Months   Status On-going   Target Date 02/07/17     PT LONG TERM GOAL #3   Title Patient negotiates ramps, curbs and stairs (1 rail) with AFO & assistive  device with supervision.      Time 2   Period Months   Status On-going   Target Date 02/07/17     PT LONG TERM GOAL #4   Title Dynamic Gait Index >/= 12/24 with cane & AFO to indicate lower fall risk.    Time 2   Period Months   Status On-going   Target Date 02/07/17     PT LONG TERM GOAL #5   Title Timed Up & Go with cane <13.5 sec and cognitive TUG <16 sec to indicate lower fall risk.    Time 2   Period Months   Status On-going   Target Date 02/07/17               Plan - 01/08/17 2242    Clinical Impression Statement Patient met 2 of 4 STGs set for first 30 days. He has only seen PT 4 visits since evaluation, not planned 8 visits so limited progress. Patient and wife verbalize understanding of updated exercises to address right hip pain which appears to be piriformis & ITB syndromes.    Rehab Potential Good   PT Frequency 2x / week   PT Duration Other (comment)  9 weeks (60 days)   PT Treatment/Interventions ADLs/Self Care Home Management;DME Instruction;Gait training;Stair training;Functional mobility training;Therapeutic activities;Therapeutic exercise;Balance training;Neuromuscular re-education;Patient/family education;Orthotic Fit/Training   PT Next Visit Plan check remaining 2 STGs and continue to review HEP   Consulted and Agree with Plan of Care Patient;Family member/caregiver   Family Member Consulted wife      Patient will benefit from skilled therapeutic intervention in order to improve the following deficits and impairments:  Abnormal gait, Decreased activity tolerance, Decreased balance, Decreased coordination, Decreased endurance, Decreased knowledge of use of DME, Decreased mobility, Decreased range of motion, Decreased strength, Impaired flexibility, Impaired UE functional use, Postural dysfunction, Impaired tone  Visit Diagnosis: Other abnormalities of gait and mobility  Unsteadiness on feet  Paralytic gait  Dizziness and giddiness  Repeated  falls  Abnormal posture     Problem List Patient Active Problem List   Diagnosis Date Noted  . Pulmonary nodule 10/24/2016  . Acute hypoxemic respiratory failure ([place]) 10/21/2016  . Sepsis ([place]) 10/21/2016  . DVT (deep venous thrombosis) ([place]) 10/12/2016  . Supratherapeutic INR 10/12/2016  . Acute deep vein thrombosis (DVT) of popliteal vein of right lower extremity ([place]) 10/12/2016  . AKI (acute kidney injury) ([place]) 10/12/2016  . Fever 10/12/2016  .  Leukocytosis 10/12/2016  . Bacteriuria 10/12/2016  . Deep vein thrombosis (DVT) ([place]) 03/02/2015  . Lupus anticoagulant disorder ([place]) 03/02/2015  . Ischemic stroke ([place]) 10/04/2014  . 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 Deep vein thrombosis ([place]) 10/04/2014  . 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 LA (lupus anticoagulant) disorder ([place]) 10/04/2014  . Arteriosclerosis of coronary artery 09/10/2012  . Apnea, sleep 09/10/2012  . 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 Dermatophytic onychia 01/29/2012  . Spastic hemiplegia ([place]) 12/03/2011  . Hemiparesis, left ([place]) 10/31/2011  . Dysphonia 08/13/2011  . Idiopathic thrombocytopenic purpura ([place]) 08/12/2011  . ITP (idiopathic thrombocytopenic purpura) 08/12/2011    Jamey Reas PT, DPT 01/08/2017, 10:46 PM  [facility] [address] [place], [place], [address] Phone: [phone]   Fax:  [phone]  Name: KEYAAN LEDERMAN MRN: [id number] Date of Birth: 08-Jun-1945

## 2017-01-13 ENCOUNTER — Encounter: Payer: Self-pay | Admitting: Physical Therapy

## 2017-01-13 ENCOUNTER — Ambulatory Visit: Payer: Medicare HMO | Attending: Family Medicine | Admitting: Physical Therapy

## 2017-01-13 ENCOUNTER — Ambulatory Visit: Payer: Medicare HMO | Admitting: Speech Pathology

## 2017-01-13 DIAGNOSIS — M6281 Muscle weakness (generalized): Secondary | ICD-10-CM | POA: Diagnosis present

## 2017-01-13 DIAGNOSIS — R296 Repeated falls: Secondary | ICD-10-CM | POA: Diagnosis present

## 2017-01-13 DIAGNOSIS — R261 Paralytic gait: Secondary | ICD-10-CM | POA: Diagnosis present

## 2017-01-13 DIAGNOSIS — R293 Abnormal posture: Secondary | ICD-10-CM | POA: Diagnosis present

## 2017-01-13 DIAGNOSIS — R1312 Dysphagia, oropharyngeal phase: Secondary | ICD-10-CM | POA: Insufficient documentation

## 2017-01-13 DIAGNOSIS — R2681 Unsteadiness on feet: Secondary | ICD-10-CM | POA: Insufficient documentation

## 2017-01-13 DIAGNOSIS — R2689 Other abnormalities of gait and mobility: Secondary | ICD-10-CM | POA: Diagnosis present

## 2017-01-13 NOTE — Therapy (Signed)
[facility] [address] [place], [place], [address] Phone: [phone]   Fax:  [phone]  Speech Language Pathology Treatment  Patient Details  Name: Mark [name] MRN: [id number] Date of Birth: 06-Apr-1946 Referring Provider: Dr. Harlan Stains  Encounter Date: 01/13/2017      End of Session - 01/13/17 1923    Visit Number 4   Number of Visits 17   Date for SLP Re-Evaluation 02/07/17   SLP Start Time 8938   SLP Stop Time  1400   SLP Time Calculation (min) 42 min   Activity Tolerance Patient tolerated treatment well      Past Medical History:  Diagnosis Date  . Chronic ITP (idiopathic thrombocytopenia) (HCC)   . Coronary artery disease 1992   MI  . DVT (deep venous thrombosis) ([place])   . 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 Hypertension   . Lupus anticoagulant disorder ([place])   . Multiple closed anterior-posterior compression fractures of pelvis ([place])   . Seizures ([place])   . Stroke [facility]) 02/27/2011   Left side weakness    Past Surgical History:  Procedure Laterality Date  . blood clot Right    surgical removal  . CHOLECYSTECTOMY    . CORONARY ANGIOPLASTY  1992  . SPLENECTOMY, TOTAL    . vertebralplasty      There were no vitals filed for this visit.      Subjective Assessment - 01/13/17 1418    Subjective "He's doing better with the chin tuck." wife reports carryover of chin tuck with liquids during PT session today   Patient is accompained by: Family member   Special Tests wife   Currently in Pain? No/denies               ADULT SLP TREATMENT - 01/13/17 1318      General Information   Behavior/Cognition Alert;Cooperative;Pleasant mood;Impulsive   Patient Positioning Upright in chair   Oral care provided N/A     Treatment Provided   Treatment provided Dysphagia     Dysphagia Treatment   Temperature Spikes Noted No   Respiratory Status Room air   Treatment Methods Therapeutic exercise;Skilled  observation;Patient/caregiver education   Patient observed directly with PO's Yes   Type of PO's observed Regular;Thin liquids;Dysphagia 1 (puree)   Feeding Other (Comment)  supervision/cuing for precautions   Liquids provided via Cup   Oral Phase Signs & Symptoms --  none   Pharyngeal Phase Signs & Symptoms --  none   Type of cueing Verbal   Amount of cueing Minimal   Other treatment/comments Pt told SLP 9/11 exercises, 4/5 precautions. Recalled additional exercises/precautions after subtle cue from wife. Today worked with pt on accuracy of Mendelsohn maneuver, which he struggles with after initial repetitions, suspect due to fatigue. Wet spoon cues effective. SLP demo'd light palpation vs pressure to larynx for pt to self-monitor his laryngeal elevation. Skilled observation of regular and pureed solids with thin liquids; pt did not initially implement chin tuck, and required initial mod SLP cues for adequate positioning during the maneuver. Faded cues to supervision; no overt signs of aspiration noted today.      Assessment / Recommendations / Plan   Plan Continue with current plan of care     Dysphagia Recommendations   Diet recommendations Regular;Thin liquid   Liquids provided via Cup   Medication Administration Whole meds with puree   Supervision Intermittent supervision to cue for compensatory strategies   Compensations Small sips/bites;Follow solids  with liquid   Postural Changes and/or Swallow Maneuvers Chin tuck     Progression Toward Goals   Progression toward goals Progressing toward goals          SLP Education - 01/13/17 1913    Education provided Yes   Education Details pt may benefit from repeat MBS after sufficient time/frequency/intensity of HEP   Person(s) Educated Patient;Spouse   Methods Explanation   Comprehension Verbalized understanding          SLP Short Term Goals - 01/13/17 1925      SLP SHORT TERM GOAL #1   Title Pt will follow swallow  precautions with rare min A 9/10 trials over three visits   Baseline 01-08-17   Time 3   Period Weeks   Status On-going     SLP SHORT TERM GOAL #2   Title Pt will perform HEP for dysphagia with occasional min A over 2 sessions   Baseline 01-08-17   Time 3   Period Weeks   Status On-going     SLP SHORT TERM GOAL #3   Title Pt/spouse will verbalize s/s of aspiration pna   Time 3   Period Weeks   Status On-going          SLP Long Term Goals - 01/13/17 1926      SLP LONG TERM GOAL #1   Title Pt will follow swallow precautions with rare min A outside of therapy over 4 sessions per spouse report.   Period Weeks   Status On-going     SLP LONG TERM GOAL #2   Title Pt will perform HEP for dysphagia with rare min A over 2 sessions   Time 7   Period Weeks   Status On-going          Plan - 01/13/17 1923    Clinical Impression Statement SLP targeted accuracy of HEP with Mendelsohn maneuver today. Pt's compliance improved since last session, as demonstrated by pt able to name/recall 9/11 exercises spontaneously and per his wife's report of compliance. Pt required initial cuing for swallow precautions today however able to fade support to supervision. Wife reports pt forgets small bites of food at times, but otherwise does well with precautions at home. Skilled ST remains necessary for improving safety while eating.    Speech Therapy Frequency 2x / week   Treatment/Interventions Aspiration precaution training;Pharyngeal strengthening exercises;Diet toleration management by SLP;Environmental controls;Compensatory techniques;Internal/external aids;SLP instruction and feedback;Patient/family education   Potential to Achieve Goals Good   Potential Considerations Co-morbidities   SLP Home Exercise Plan HEP for dysphagia - see pt instructions   Consulted and Agree with Plan of Care Patient;Family member/caregiver   Family Member Consulted spouse      Patient will benefit from skilled  therapeutic intervention in order to improve the following deficits and impairments:   Dysphagia, oropharyngeal phase    Problem List Patient Active Problem List   Diagnosis Date Noted  . Pulmonary nodule 10/24/2016  . Acute hypoxemic respiratory failure ([place]) 10/21/2016  . Sepsis ([place]) 10/21/2016  . DVT (deep venous thrombosis) ([place]) 10/12/2016  . Supratherapeutic INR 10/12/2016  . Acute deep vein thrombosis (DVT) of popliteal vein of right lower extremity ([place]) 10/12/2016  . AKI (acute kidney injury) ([place]) 10/12/2016  . Fever 10/12/2016  . Leukocytosis 10/12/2016  . Bacteriuria 10/12/2016  . Deep vein thrombosis (DVT) ([place]) 03/02/2015  . Lupus anticoagulant disorder ([place]) 03/02/2015  . Ischemic stroke ([place]) 10/04/2014  . 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 Deep  vein thrombosis ([place]) 10/04/2014  . 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 LA (lupus anticoagulant) disorder ([place]) 10/04/2014  . Arteriosclerosis of coronary artery 09/10/2012  . Apnea, sleep 09/10/2012  . 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 Dermatophytic onychia 01/29/2012  . Spastic hemiplegia ([place]) 12/03/2011  . Hemiparesis, left ([place]) 10/31/2011  . Dysphonia 08/13/2011  . Idiopathic thrombocytopenic purpura ([place]) 08/12/2011  . ITP (idiopathic thrombocytopenic purpura) 08/12/2011   Deneise Lever, [place], [place] 01/13/2017, 7:26 PM  [facility] [address] [place], [place], [address] Phone: [phone]   Fax:  [phone]   Name: Mark [name] MRN: [id number] Date of Birth: [birth year]

## 2017-01-13 NOTE — Therapy (Signed)
[facility] [address] [place], [place], [address] Phone: [phone]   Fax:  [phone]  Physical Therapy Treatment  Patient Details  Name: Mark [name] MRN: [id number] Date of Birth: January 11, 1946 Referring Provider: Harlan Stains, MD  Encounter Date: 01/13/2017      PT End of Session - 01/13/17 2008    Visit Number 6   Number of Visits 18   Date for PT Re-Evaluation 02/07/17   Authorization Type AETNA Medicare G-code & progress note   PT Start Time 1315   PT Stop Time 1400   PT Time Calculation (min) 45 min   Equipment Utilized During Treatment Gait belt   Activity Tolerance Patient tolerated treatment well   Behavior During Therapy [facility] for tasks assessed/performed      Past Medical History:  Diagnosis Date  . Chronic ITP (idiopathic thrombocytopenia) (HCC)   . Coronary artery disease 1992   MI  . DVT (deep venous thrombosis) ([place])   . 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 Hypertension   . Lupus anticoagulant disorder ([place])   . Multiple closed anterior-posterior compression fractures of pelvis ([place])   . Seizures ([place])   . Stroke [facility]) 02/27/2011   Left side weakness    Past Surgical History:  Procedure Laterality Date  . blood clot Right    surgical removal  . CHOLECYSTECTOMY    . CORONARY ANGIOPLASTY  1992  . SPLENECTOMY, TOTAL    . vertebralplasty      There were no vitals filed for this visit.      Subjective Assessment - 01/13/17 1321    Subjective No falls. He has been sleeping with pillow and doing hip stretches. Both are helping his right hip pain    Pertinent History CVA spastic hemiplegia, CAD/MI, depression, ADHD, osteoporosis, HTN, lupus, epilepsy, sz, hearing loss,   Limitations Lifting;Standing;Walking;House hold activities   Patient Stated Goals to return his activity level to prior level   Currently in Pain? Yes   Pain Score 1    Pain Location Hip   Pain Orientation Right   Pain  Descriptors / Indicators Aching   Pain Type Chronic pain   Pain Onset More than a month ago   Pain Frequency Intermittent   Aggravating Factors  walking   Pain Relieving Factors resting                         [facility] Adult PT Treatment/Exercise - 01/13/17 1315      Transfers   Transfers Sit to Stand;Stand to Sit   Sit to Stand 5: Supervision;With upper extremity assist;With armrests;From chair/3-in-1   Sit to Stand Details Visual cues/gestures for sequencing;Verbal cues for sequencing;Verbal cues for technique   Stand to Sit 5: Supervision;With upper extremity assist;With armrests;To chair/3-in-1   Stand to Sit Details (indicate cue type and reason) Visual cues/gestures for sequencing;Verbal cues for technique;Verbal cues for sequencing   Stand to Sit Details visual cues on wt shift and technique     Ambulation/Gait   Ambulation/Gait Yes   Ambulation/Gait Assistance 5: Supervision;4: Min guard   Ambulation/Gait Assistance Details naming items in category familar to patient while negotiating around furniture   Ambulation Distance (Feet) 200 Feet   Assistive device Straight cane;Other (Comment)  AFO   Ambulation Surface Indoor;Level   Gait velocity 1.86 ft/sec   Initial was 1.32 ft/sec             Balance Exercises - 01/13/17  [place]   Knee Bends 10 reps, support   Backwards Walking Support   Walking and Turning Around Assistive device   Sideways Walking Assistive device   Tandem Stance 10 seconds, no support  near counter for intermittent UE support   Tandem Walk Support   One Leg Stand 10 seconds, no support  modified with forefoot in counter   Heel Walking Support   Toe Walk Support   Heel Toe Walking Backward --  support on counter   Sit to Stand 20 reps, one support           PT Education - 01/13/17 1315    Education provided Yes   Education Details OTAGO HEP broken up into 2-4 exercises at time    Person(s) Educated  Patient;Spouse   Methods Explanation;Verbal cues   Comprehension Verbalized understanding;Verbal cues required;Need further instruction          PT Short Term Goals - 01/13/17 2008      PT SHORT TERM GOAL #1   Title Patient ambulates around furniture with cane & AFO performing naming tasks without balance loss.    Baseline MET 01/13/17   Time 1   Period Months   Status Achieved     PT SHORT TERM GOAL #2   Title Patient & wife verbalize understanding of orthostatic hypotension management techniques.    Baseline MET 01/08/2017   Time 1   Period Months   Status Achieved     PT SHORT TERM GOAL #3   Title Pt will demonstrate understanding of initial HEP.    Baseline MET 01/08/2017   Time 1   Period Months   Status Achieved     PT SHORT TERM GOAL #4   Title Patient's gait velocity with AFO & cane 1.7 ft/sec   Baseline MET 01/13/17 Gait Velocity 1.86 ft/sec   Time 1   Period Months   Status Achieved           PT Long Term Goals - 12/12/16 2026      PT LONG TERM GOAL #1   Title Patient and wife demonstrate and verbalize understanding of ongoing fitness plan.     Time 2   Period Months   Status On-going   Target Date 02/07/17     PT LONG TERM GOAL #2   Title Patient ambulates 300' on outside surfaces including grass with supervision with AFO & assistive device.    Time 2   Period Months   Status On-going   Target Date 02/07/17     PT LONG TERM GOAL #3   Title Patient negotiates ramps, curbs and stairs (1 rail) with AFO & assistive device with supervision.      Time 2   Period Months   Status On-going   Target Date 02/07/17     PT LONG TERM GOAL #4   Title Dynamic Gait Index >/= 12/24 with cane & AFO to indicate lower fall risk.    Time 2   Period Months   Status On-going   Target Date 02/07/17     PT LONG TERM GOAL #5   Title Timed Up & Go with cane <13.5 sec and cognitive TUG <16 sec to indicate lower fall risk.    Time 2   Period Months   Status  On-going   Target Date 02/07/17               Plan - 01/13/17 2010  Clinical Impression Statement Patient met all STGs. Patient and wife appear to understand HEP and are able to perform safely.    Rehab Potential Good   PT Frequency 2x / week   PT Duration Other (comment)  9 weeks (60 days)   PT Treatment/Interventions ADLs/Self Care Home Management;DME Instruction;Gait training;Stair training;Functional mobility training;Therapeutic activities;Therapeutic exercise;Balance training;Neuromuscular re-education;Patient/family education;Orthotic Fit/Training   PT Next Visit Plan work towards [employer] and Agree with Plan of Care Patient;Family member/caregiver   Family Member Consulted wife      Patient will benefit from skilled therapeutic intervention in order to improve the following deficits and impairments:  Abnormal gait, Decreased activity tolerance, Decreased balance, Decreased coordination, Decreased endurance, Decreased knowledge of use of DME, Decreased mobility, Decreased range of motion, Decreased strength, Impaired flexibility, Impaired UE functional use, Postural dysfunction, Impaired tone  Visit Diagnosis: Other abnormalities of gait and mobility  Unsteadiness on feet  Paralytic gait  Repeated falls  Abnormal posture  Muscle weakness (generalized)     Problem List Patient Active Problem List   Diagnosis Date Noted  . Pulmonary nodule 10/24/2016  . Acute hypoxemic respiratory failure ([place]) 10/21/2016  . Sepsis ([place]) 10/21/2016  . DVT (deep venous thrombosis) ([place]) 10/12/2016  . Supratherapeutic INR 10/12/2016  . Acute deep vein thrombosis (DVT) of popliteal vein of right lower extremity ([place]) 10/12/2016  . AKI (acute kidney injury) ([place]) 10/12/2016  . Fever 10/12/2016  . Leukocytosis 10/12/2016  . Bacteriuria 10/12/2016  . Deep vein thrombosis (DVT) ([place]) 03/02/2015  . Lupus anticoagulant disorder ([place]) 03/02/2015  . Ischemic stroke ([place])  10/04/2014  . 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 Deep vein thrombosis ([place]) 10/04/2014  . 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 LA (lupus anticoagulant) disorder ([place]) 10/04/2014  . Arteriosclerosis of coronary artery 09/10/2012  . Apnea, sleep 09/10/2012  . 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 Dermatophytic onychia 01/29/2012  . Spastic hemiplegia ([place]) 12/03/2011  . Hemiparesis, left ([place]) 10/31/2011  . Dysphonia 08/13/2011  . Idiopathic thrombocytopenic purpura ([place]) 08/12/2011  . ITP (idiopathic thrombocytopenic purpura) 08/12/2011    Jamey Reas PT, DPT 01/13/2017, 8:12 PM  [facility] [address] [place], [place], [address] Phone: [phone]   Fax:  [phone]  Name: Mark [name] MRN: [id number] Date of Birth: May 05, 1945

## 2017-01-14 DIAGNOSIS — J302 Other seasonal allergic rhinitis: Secondary | ICD-10-CM | POA: Diagnosis not present

## 2017-01-14 DIAGNOSIS — I69359 Hemiplegia and hemiparesis following cerebral infarction affecting unspecified side: Secondary | ICD-10-CM | POA: Diagnosis not present

## 2017-01-14 DIAGNOSIS — R05 Cough: Secondary | ICD-10-CM | POA: Diagnosis not present

## 2017-01-16 ENCOUNTER — Ambulatory Visit: Payer: Medicare HMO | Admitting: Physical Therapy

## 2017-01-16 ENCOUNTER — Ambulatory Visit: Payer: Medicare HMO | Admitting: Speech Pathology

## 2017-01-20 ENCOUNTER — Ambulatory Visit: Payer: Medicare HMO

## 2017-01-20 ENCOUNTER — Ambulatory Visit: Payer: Medicare HMO | Admitting: Physical Therapy

## 2017-01-20 DIAGNOSIS — Z8673 Personal history of transient ischemic attack (TIA), and cerebral infarction without residual deficits: Secondary | ICD-10-CM | POA: Diagnosis not present

## 2017-01-20 DIAGNOSIS — G8194 Hemiplegia, unspecified affecting left nondominant side: Secondary | ICD-10-CM | POA: Diagnosis not present

## 2017-01-21 ENCOUNTER — Encounter: Payer: Self-pay | Admitting: Physical Therapy

## 2017-01-21 ENCOUNTER — Ambulatory Visit: Payer: Medicare HMO | Admitting: Physical Therapy

## 2017-01-21 ENCOUNTER — Ambulatory Visit: Payer: Medicare HMO

## 2017-01-21 DIAGNOSIS — R2681 Unsteadiness on feet: Secondary | ICD-10-CM

## 2017-01-21 DIAGNOSIS — M6281 Muscle weakness (generalized): Secondary | ICD-10-CM

## 2017-01-21 DIAGNOSIS — R1312 Dysphagia, oropharyngeal phase: Secondary | ICD-10-CM

## 2017-01-21 DIAGNOSIS — R293 Abnormal posture: Secondary | ICD-10-CM

## 2017-01-21 DIAGNOSIS — R2689 Other abnormalities of gait and mobility: Secondary | ICD-10-CM | POA: Diagnosis not present

## 2017-01-21 DIAGNOSIS — R261 Paralytic gait: Secondary | ICD-10-CM

## 2017-01-21 NOTE — Therapy (Signed)
[facility] [address] [place], [place], [address] Phone: [phone]   Fax:  [phone]  Physical Therapy Treatment  Patient Details  Name: Mark [name] MRN: [id number] Date of Birth: April 30, 1945 Referring Provider: Harlan Stains, MD  Encounter Date: 01/21/2017      PT End of Session - 01/21/17 2218    Visit Number 7   Number of Visits 18   Date for PT Re-Evaluation 02/07/17   Authorization Type AETNA Medicare G-code & progress note   PT Start Time 1322   PT Stop Time 1400   PT Time Calculation (min) 38 min   Equipment Utilized During Treatment Gait belt   Activity Tolerance Patient tolerated treatment well   Behavior During Therapy WFL for tasks assessed/performed      Past Medical History:  Diagnosis Date  . Chronic ITP (idiopathic thrombocytopenia) (HCC)   . Coronary artery disease 1992   MI  . DVT (deep venous thrombosis) ([place])   . 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 Hypertension   . Lupus anticoagulant disorder ([place])   . Multiple closed anterior-posterior compression fractures of pelvis ([place])   . Seizures ([place])   . Stroke [facility]) 02/27/2011   Left side weakness    Past Surgical History:  Procedure Laterality Date  . blood clot Right    surgical removal  . CHOLECYSTECTOMY    . CORONARY ANGIOPLASTY  1992  . SPLENECTOMY, TOTAL    . vertebralplasty      There were no vitals filed for this visit.      Subjective Assessment - 01/21/17 1422    Subjective No falls. He saw neurologist yesterday who is questioning if proper AFO   Patient is accompained by: Family member   Pertinent History CVA spastic hemiplegia, CAD/MI, depression, ADHD, osteoporosis, HTN, lupus, epilepsy, sz, hearing loss,   Limitations Lifting;Standing;Walking;House hold activities   Patient Stated Goals to return his activity level to prior level   Currently in Pain? No/denies     Gait Training with AFO: Pt arrived with T-strap twisted.  PT instructed patient & wife in proper application of T-strap. PT, pt and wife discussed KAFO & HKAFOs to control rotation but cumbersome nature would probably significantly limit use.  PT demo, instructed in use of theraband to decrease internal rotation. Pt ambulated 100' with cane, AFO & theraband spiral wrap with SBA. The spiral wrap did facilitate neutral rotation.  PT demo, instructed in stockinette over distal shoe as simulated leather toe cap. Pt ambulated 100' with cane, AFO, spiral theraband and simulated leather toe cap with improved clearance in terminal stance.  Pt ambulated 150' X 2 with cane, AFO & simulated leather toe cap with SBA.  PT advised to use spiral theraband after toileting to limit need to have to quickly remove theraband to toilet. Use theraband for 30 minutes 2x/day with 2-3 walks in the 30 minutes. Pt and wife verbalized understanding.                             PT Education - 01/21/17 1440    Education provided Yes   Education Details Use of Theraband for rotation facilitation & leather toe cap simulation   Person(s) Educated Patient;Spouse   Methods Explanation;Demonstration;Verbal cues;Other (comment)  wife videotaped on her phone   Comprehension Verbalized understanding;Returned demonstration;Verbal cues required;Tactile cues required;Need further instruction          PT Short Term Goals -  01/13/17 2008      PT SHORT TERM GOAL #1   Title Patient ambulates around furniture with cane & AFO performing naming tasks without balance loss.    Baseline MET 01/13/17   Time 1   Period Months   Status Achieved     PT SHORT TERM GOAL #2   Title Patient & wife verbalize understanding of orthostatic hypotension management techniques.    Baseline MET 01/08/2017   Time 1   Period Months   Status Achieved     PT SHORT TERM GOAL #3   Title Pt will demonstrate understanding of initial HEP.    Baseline MET 01/08/2017   Time 1   Period Months    Status Achieved     PT SHORT TERM GOAL #4   Title Patient's gait velocity with AFO & cane 1.7 ft/sec   Baseline MET 01/13/17 Gait Velocity 1.86 ft/sec   Time 1   Period Months   Status Achieved           PT Long Term Goals - 12/12/16 2026      PT LONG TERM GOAL #1   Title Patient and wife demonstrate and verbalize understanding of ongoing fitness plan.     Time 2   Period Months   Status On-going   Target Date 02/07/17     PT LONG TERM GOAL #2   Title Patient ambulates 300' on outside surfaces including grass with supervision with AFO & assistive device.    Time 2   Period Months   Status On-going   Target Date 02/07/17     PT LONG TERM GOAL #3   Title Patient negotiates ramps, curbs and stairs (1 rail) with AFO & assistive device with supervision.      Time 2   Period Months   Status On-going   Target Date 02/07/17     PT LONG TERM GOAL #4   Title Dynamic Gait Index >/= 12/24 with cane & AFO to indicate lower fall risk.    Time 2   Period Months   Status On-going   Target Date 02/07/17     PT LONG TERM GOAL #5   Title Timed Up & Go with cane <13.5 sec and cognitive TUG <16 sec to indicate lower fall risk.    Time 2   Period Months   Status On-going   Target Date 02/07/17               Plan - 01/21/17 2219    Clinical Impression Statement Use of theraband to neutral rotation (Not internally rotating ) improved his gait. Use of simulated leather toe cap also improved gait. Patient would need a KAFO to control LE but would be so cumbersome that he would probably not be used.    Rehab Potential Good   PT Frequency 2x / week   PT Duration Other (comment)  9 weeks (60 days)   PT Treatment/Interventions ADLs/Self Care Home Management;DME Instruction;Gait training;Stair training;Functional mobility training;Therapeutic activities;Therapeutic exercise;Balance training;Neuromuscular re-education;Patient/family education;Orthotic Fit/Training   PT Next Visit  Plan work towards [place], check theraband rotation assist and leather toe cap with gait.    Consulted and Agree with Plan of Care Patient;Family member/caregiver   Family Member Consulted wife      Patient will benefit from skilled therapeutic intervention in order to improve the following deficits and impairments:  Abnormal gait, Decreased activity tolerance, Decreased balance, Decreased coordination, Decreased endurance, Decreased knowledge of use of DME, Decreased mobility, Decreased  range of motion, Decreased strength, Impaired flexibility, Impaired UE functional use, Postural dysfunction, Impaired tone  Visit Diagnosis: Other abnormalities of gait and mobility  Unsteadiness on feet  Paralytic gait  Abnormal posture  Muscle weakness (generalized)     Problem List Patient Active Problem List   Diagnosis Date Noted  . Pulmonary nodule 10/24/2016  . Acute hypoxemic respiratory failure ([place]) 10/21/2016  . Sepsis ([place]) 10/21/2016  . DVT (deep venous thrombosis) ([place]) 10/12/2016  . Supratherapeutic INR 10/12/2016  . Acute deep vein thrombosis (DVT) of popliteal vein of right lower extremity ([place]) 10/12/2016  . AKI (acute kidney injury) ([place]) 10/12/2016  . Fever 10/12/2016  . Leukocytosis 10/12/2016  . Bacteriuria 10/12/2016  . Deep vein thrombosis (DVT) ([place]) 03/02/2015  . Lupus anticoagulant disorder ([place]) 03/02/2015  . Ischemic stroke ([place]) 10/04/2014  . 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 Deep vein thrombosis ([place]) 10/04/2014  . 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 LA (lupus anticoagulant) disorder ([place]) 10/04/2014  . Arteriosclerosis of coronary artery 09/10/2012  . Apnea, sleep 09/10/2012  . 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 Dermatophytic onychia 01/29/2012  . Spastic hemiplegia ([place]) 12/03/2011  . Hemiparesis, left ([place]) 10/31/2011  . Dysphonia 08/13/2011  . Idiopathic thrombocytopenic purpura ([place]) 08/12/2011  . ITP (idiopathic  thrombocytopenic purpura) 08/12/2011    Jamey Reas PT, DPT 01/21/2017, 10:24 PM  [facility] [address] [place], [place], [address] Phone: [phone]   Fax:  [phone]  Name: NEKO BOYAJIAN MRN: [id number] Date of Birth: 13-Feb-1946

## 2017-01-21 NOTE — Therapy (Signed)
[facility] [address] [place], [place], [address] Phone: [phone]   Fax:  [phone]  Speech Language Pathology Treatment  Patient Details  Name: Mark [name] MRN: [id number] Date of Birth: 1946/02/23 Referring Provider: Dr. Harlan Stains  Encounter Date: 01/21/2017      End of Session - 01/21/17 1434    Visit Number 5   Number of Visits 17   Date for SLP Re-Evaluation 02/07/17   SLP Start Time 2706   SLP Stop Time  1445   SLP Time Calculation (min) 43 min   Activity Tolerance Patient tolerated treatment well      Past Medical History:  Diagnosis Date  . Chronic ITP (idiopathic thrombocytopenia) (HCC)   . Coronary artery disease 1992   MI  . DVT (deep venous thrombosis) ([place])   . 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 Hypertension   . Lupus anticoagulant disorder ([place])   . Multiple closed anterior-posterior compression fractures of pelvis ([place])   . Seizures ([place])   . Stroke [facility]) 02/27/2011   Left side weakness    Past Surgical History:  Procedure Laterality Date  . blood clot Right    surgical removal  . CHOLECYSTECTOMY    . CORONARY ANGIOPLASTY  1992  . SPLENECTOMY, TOTAL    . vertebralplasty      There were no vitals filed for this visit.      Subjective Assessment - 01/21/17 1403    Subjective "We forgot the book."   Patient is accompained by: Family member  wife   Currently in Pain? No/denies               ADULT SLP TREATMENT - 01/21/17 1406      General Information   Behavior/Cognition Alert;Cooperative;Pleasant mood;Impulsive     Treatment Provided   Treatment provided Dysphagia     Dysphagia Treatment   Temperature Spikes Noted No   Respiratory Status Room air   Treatment Methods Therapeutic exercise;Skilled observation;Patient/caregiver education   Patient observed directly with PO's Yes   Type of PO's observed Dysphagia 3 (soft)   Feeding --  cues for chin down    Liquids  provided via Cup   Oral Phase Signs & Symptoms --  none   Pharyngeal Phase Signs & Symptoms --  none   Type of cueing Verbal   Amount of cueing Minimal  occasional (approx 30% of the time)   Other treatment/comments Pt recalled 10/11 and 11/11 with min cues from SLP. Pt independent with 8/11 exercises, min A necessary occasionally. Pt's decr'd selective attention decr'd his success with chin tuck. Stressed to pt/wife NO DISTRACTIONS, including conversation, during mealtimes as pt forgot chin tuck when engaged in conversation. At end of session, SLP paused his conversation turn when pt began to take cup and pt performed chin tuck 2/2.      Assessment / Recommendations / Plan   Plan Continue with current plan of care     Dysphagia Recommendations   Diet recommendations Regular;Thin liquid   Liquids provided via Cup   Medication Administration Whole meds with puree   Supervision Intermittent supervision to cue for compensatory strategies   Compensations Small sips/bites;Follow solids with liquid   Postural Changes and/or Swallow Maneuvers Chin tuck     Progression Toward Goals   Progression toward goals Progressing toward goals          SLP Education - 01/21/17 1613    Education provided Yes   Education Details  chin tuck, NO DISTRACTIONS during mealtimes including conversatoin   Person(s) Educated Patient;Spouse   Methods Demonstration;Explanation   Comprehension Verbalized understanding;Returned demonstration;Verbal cues required;Need further instruction          SLP Short Term Goals - 01/21/17 1614      SLP SHORT TERM GOAL #1   Title Pt will follow swallow precautions with rare min A 9/10 trials over three visits   Baseline 01-08-17   Time 2   Period Weeks   Status On-going     SLP SHORT TERM GOAL #2   Title Pt will perform HEP for dysphagia with occasional min A over 2 sessions   Status Achieved     SLP SHORT TERM GOAL #3   Title Pt/spouse will verbalize s/s of  aspiration pna   Time 3   Period Weeks   Status On-going          SLP Long Term Goals - 01/21/17 1615      SLP LONG TERM GOAL #1   Title Pt will follow swallow precautions with rare min A outside of therapy over 4 sessions per spouse report.   Time 6   Period Weeks   Status On-going     SLP LONG TERM GOAL #2   Title Pt will perform HEP for dysphagia with rare min A over 2 sessions   Time 6   Period Weeks   Status On-going          Plan - 01/21/17 1446    Clinical Impression Statement SLP targeted accuracy of HEP with HEP maneuver today. Pt recalled 10/11 exercises spontaneously. Pt required SLP cuing for swallow precautions today however SLP able to fade support to supervision. Wife reports pt forgets small bites of food at times, but otherwise does well with precautions at home. Skilled ST remains necessary for improving safety while eating.    Speech Therapy Frequency 2x / week   Treatment/Interventions Aspiration precaution training;Pharyngeal strengthening exercises;Diet toleration management by SLP;Environmental controls;Compensatory techniques;Internal/external aids;SLP instruction and feedback;Patient/family education   Potential to Achieve Goals Good   Potential Considerations Co-morbidities   SLP Home Exercise Plan HEP for dysphagia - see pt instructions   Consulted and Agree with Plan of Care Patient;Family member/caregiver   Family Member Consulted spouse      Patient will benefit from skilled therapeutic intervention in order to improve the following deficits and impairments:   Dysphagia, oropharyngeal phase    Problem List Patient Active Problem List   Diagnosis Date Noted  . Pulmonary nodule 10/24/2016  . Acute hypoxemic respiratory failure ([place]) 10/21/2016  . Sepsis ([place]) 10/21/2016  . DVT (deep venous thrombosis) ([place]) 10/12/2016  . Supratherapeutic INR 10/12/2016  . Acute deep vein thrombosis (DVT) of popliteal vein of right lower extremity ([place])  10/12/2016  . AKI (acute kidney injury) ([place]) 10/12/2016  . Fever 10/12/2016  . Leukocytosis 10/12/2016  . Bacteriuria 10/12/2016  . Deep vein thrombosis (DVT) ([place]) 03/02/2015  . Lupus anticoagulant disorder ([place]) 03/02/2015  . Ischemic stroke ([place]) 10/04/2014  . 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 Deep vein thrombosis ([place]) 10/04/2014  . 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 LA (lupus anticoagulant) disorder ([place]) 10/04/2014  . Arteriosclerosis of coronary artery 09/10/2012  . Apnea, sleep 09/10/2012  . 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 Dermatophytic onychia 01/29/2012  . Spastic hemiplegia ([place]) 12/03/2011  . Hemiparesis, left ([place]) 10/31/2011  . Dysphonia 08/13/2011  . Idiopathic thrombocytopenic purpura ([place]) 08/12/2011  . ITP (idiopathic thrombocytopenic purpura) 08/12/2011  [facility] ,[place], [place]  01/21/2017, 4:15 PM  [facility] [address] [place], [place], [address] Phone: [phone]   Fax:  [phone]   Name: KASH DAVIE MRN: [id number] Date of Birth: 1946-01-02

## 2017-01-21 NOTE — Patient Instructions (Signed)
  REMEMBER THAT CHIN TUCK!!

## 2017-01-22 ENCOUNTER — Ambulatory Visit: Payer: Medicare HMO | Admitting: Physical Therapy

## 2017-01-22 ENCOUNTER — Other Ambulatory Visit: Payer: Medicare Other

## 2017-01-23 ENCOUNTER — Ambulatory Visit: Payer: Medicare HMO | Admitting: Speech Pathology

## 2017-01-23 ENCOUNTER — Ambulatory Visit: Payer: Medicare HMO | Admitting: Physical Therapy

## 2017-01-27 ENCOUNTER — Ambulatory Visit: Payer: Medicare HMO | Admitting: Physical Therapy

## 2017-01-28 ENCOUNTER — Ambulatory Visit: Payer: Medicare HMO | Admitting: Physical Therapy

## 2017-01-28 ENCOUNTER — Ambulatory Visit: Payer: Medicare HMO

## 2017-01-29 ENCOUNTER — Ambulatory Visit: Payer: Medicare HMO | Admitting: Physical Therapy

## 2017-01-30 ENCOUNTER — Ambulatory Visit: Payer: Medicare HMO | Admitting: Physical Therapy

## 2017-01-30 ENCOUNTER — Encounter: Payer: Self-pay | Admitting: Physical Therapy

## 2017-01-30 DIAGNOSIS — R293 Abnormal posture: Secondary | ICD-10-CM

## 2017-01-30 DIAGNOSIS — R2689 Other abnormalities of gait and mobility: Secondary | ICD-10-CM | POA: Diagnosis not present

## 2017-01-30 DIAGNOSIS — R261 Paralytic gait: Secondary | ICD-10-CM

## 2017-01-30 DIAGNOSIS — R2681 Unsteadiness on feet: Secondary | ICD-10-CM

## 2017-01-30 DIAGNOSIS — M6281 Muscle weakness (generalized): Secondary | ICD-10-CM

## 2017-01-31 NOTE — Therapy (Signed)
[facility] [address] [place], [place], [address] Phone: [phone]   Fax:  [phone]  Physical Therapy Treatment  Patient Details  Name: Mark [name] MRN: [id number] Date of Birth: 07/15/1945 Referring Provider: Cynthia White, MD  Encounter Date: 01/30/2017      PT End of Session - 01/30/17 1938    Visit Number 8   Number of Visits 18   Date for PT Re-Evaluation 02/07/17   Authorization Type AETNA Medicare G-code & progress note   PT Start Time 1105   PT Stop Time 1147   PT Time Calculation (min) 42 min   Equipment Utilized During Treatment Gait belt   Activity Tolerance Patient tolerated treatment well   Behavior During Therapy WFL for tasks assessed/performed      Past Medical History:  Diagnosis Date  . Chronic ITP (idiopathic thrombocytopenia) (HCC)   . Coronary artery disease 1992   MI  . DVT (deep venous thrombosis) (HCC)   . 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 Hypertension   . Lupus anticoagulant disorder (HCC)   . Multiple closed anterior-posterior compression fractures of pelvis (HCC)   . Seizures (HCC)   . Stroke ([facility]) 02/27/2011   Left side weakness    Past Surgical History:  Procedure Laterality Date  . blood clot Right    surgical removal  . CHOLECYSTECTOMY    . CORONARY ANGIOPLASTY  1992  . SPLENECTOMY, TOTAL    . vertebralplasty      There were no vitals filed for this visit.      Subjective Assessment - 01/30/17 1106    Subjective No falls. His wife is having trouble with theraband spiral strap with her arthritic hands.     Patient is accompained by: Family member   Pertinent History CVA spastic hemiplegia, CAD/MI, depression, ADHD, osteoporosis, HTN, lupus, epilepsy, sz, hearing loss,   Limitations Lifting;Standing;Walking;House hold activities   Patient Stated Goals to return his activity level to prior level   Currently in Pain? No/denies      Therapeutic Exercise: Seated  passive hamstring stretch 30 sec hold 3 reps ea LE Seated trunk rotation stretch with manual assist for neutral hip abd/add & rotation: 30 sec hold 3 reps ea direction. Wife return demo understanding.  Gait Training:  Pt ambulated 100' from lobby to gym with cane & AFO with less internal rotation than previous sessions.  PT reviewed proper application of T-strap on AFO. Pt & wife verbalized improved understanding.  PT applied theraband around LLE spiral to facilitate decreased internal rotation & cloth to simulate leather toe cap. Pt ambulated 200' indoors with cane & supervision/verbal cues on posture & LLE positioning.  Pt ambulated 400' outdoors on paved surfaces with cane & supervision with above cues.                             PT Short Term Goals - 01/13/17 2008      PT SHORT TERM GOAL #1   Title Patient ambulates around furniture with cane & AFO performing naming tasks without balance loss.    Baseline MET 01/13/17   Time 1   Period Months   Status Achieved     PT SHORT TERM GOAL #2   Title Patient & wife verbalize understanding of orthostatic hypotension management techniques.    Baseline MET 01/08/2017   Time 1   Period Months   Status Achieved       PT SHORT TERM GOAL #3   Title Pt will demonstrate understanding of initial HEP.    Baseline MET 01/08/2017   Time 1   Period Months   Status Achieved     PT SHORT TERM GOAL #4   Title Patient's gait velocity with AFO & cane 1.7 ft/sec   Baseline MET 01/13/17 Gait Velocity 1.86 ft/sec   Time 1   Period Months   Status Achieved           PT Long Term Goals - 12/12/16 2026      PT LONG TERM GOAL #1   Title Patient and wife demonstrate and verbalize understanding of ongoing fitness plan.     Time 2   Period Months   Status On-going   Target Date 02/07/17     PT LONG TERM GOAL #2   Title Patient ambulates 300' on outside surfaces including grass with supervision with AFO & assistive device.     Time 2   Period Months   Status On-going   Target Date 02/07/17     PT LONG TERM GOAL #3   Title Patient negotiates ramps, curbs and stairs (1 rail) with AFO & assistive device with supervision.      Time 2   Period Months   Status On-going   Target Date 02/07/17     PT LONG TERM GOAL #4   Title Dynamic Gait Index >/= 12/24 with cane & AFO to indicate lower fall risk.    Time 2   Period Months   Status On-going   Target Date 02/07/17     PT LONG TERM GOAL #5   Title Timed Up & Go with cane <13.5 sec and cognitive TUG <16 sec to indicate lower fall risk.    Time 2   Period Months   Status On-going   Target Date 02/07/17               Plan - 01/30/17 1938    Clinical Impression Statement Patient & wife appear to understand trunk rotation stretch with wife assisting LE position to stretch hips & trunk. Patient had less internal rotation of LLE with theraband spiral wrap and wife reports understands application better to not stress her hands. Patient needs a leather top cap added to his left shoe.     Rehab Potential Good   PT Frequency 2x / week   PT Duration Other (comment)  9 weeks (60 days)   PT Treatment/Interventions ADLs/Self Care Home Management;DME Instruction;Gait training;Stair training;Functional mobility training;Therapeutic activities;Therapeutic exercise;Balance training;Neuromuscular re-education;Patient/family education;Orthotic Fit/Training   PT Next Visit Plan Check LTGs with plan to discharge.   Consulted and Agree with Plan of Care Patient;Family member/caregiver   Family Member Consulted wife      Patient will benefit from skilled therapeutic intervention in order to improve the following deficits and impairments:  Abnormal gait, Decreased activity tolerance, Decreased balance, Decreased coordination, Decreased endurance, Decreased knowledge of use of DME, Decreased mobility, Decreased range of motion, Decreased strength, Impaired flexibility,  Impaired UE functional use, Postural dysfunction, Impaired tone  Visit Diagnosis: Other abnormalities of gait and mobility  Unsteadiness on feet  Paralytic gait  Abnormal posture  Muscle weakness (generalized)     Problem List Patient Active Problem List   Diagnosis Date Noted  . Pulmonary nodule 10/24/2016  . Acute hypoxemic respiratory failure ([place]) 10/21/2016  . Sepsis ([place]) 10/21/2016  . DVT (deep venous thrombosis) ([place]) 10/12/2016  . Supratherapeutic INR 10/12/2016  . Acute deep  vein thrombosis (DVT) of popliteal vein of right lower extremity (HCC) 10/12/2016  . AKI (acute kidney injury) (HCC) 10/12/2016  . Fever 10/12/2016  . Leukocytosis 10/12/2016  . Bacteriuria 10/12/2016  . Deep vein thrombosis (DVT) (HCC) 03/02/2015  . Lupus anticoagulant disorder (HCC) 03/02/2015  . Ischemic stroke (HCC) 10/04/2014  . 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 Deep vein thrombosis (HCC) 10/04/2014  . 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  . LA (lupus anticoagulant) disorder (HCC) 10/04/2014  . Arteriosclerosis of coronary artery 09/10/2012  . Apnea, sleep 09/10/2012  . 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 Dermatophytic onychia 01/29/2012  . Spastic hemiplegia (HCC) 12/03/2011  . Hemiparesis, left (HCC) 10/31/2011  . Dysphonia 08/13/2011  . Idiopathic thrombocytopenic purpura (HCC) 08/12/2011  . ITP (idiopathic thrombocytopenic purpura) 08/12/2011    Lizet Kelso PT, DPT 01/31/2017, 9:44 AM  [facility] [address] [place], [place], [address] Phone: [phone]   Fax:  [phone]  Name: Irvan M Glick MRN: [id number] Date of Birth: 10/31/1945   

## 2017-02-03 ENCOUNTER — Ambulatory Visit: Payer: Medicare HMO | Admitting: Physical Therapy

## 2017-02-03 DIAGNOSIS — G4733 Obstructive sleep apnea (adult) (pediatric): Secondary | ICD-10-CM | POA: Diagnosis not present

## 2017-02-04 ENCOUNTER — Other Ambulatory Visit: Payer: Self-pay | Admitting: Family Medicine

## 2017-02-04 ENCOUNTER — Ambulatory Visit: Payer: Medicare HMO | Admitting: Speech Pathology

## 2017-02-04 ENCOUNTER — Encounter: Payer: Self-pay | Admitting: Physical Therapy

## 2017-02-04 ENCOUNTER — Ambulatory Visit
Admission: RE | Admit: 2017-02-04 | Discharge: 2017-02-04 | Disposition: A | Discharge status: Home / Self Care | Payer: Medicare HMO | Source: Ambulatory Visit | Attending: Family Medicine | Admitting: Family Medicine

## 2017-02-04 ENCOUNTER — Ambulatory Visit: Payer: Medicare HMO | Admitting: Physical Therapy

## 2017-02-04 DIAGNOSIS — R05 Cough: Secondary | ICD-10-CM

## 2017-02-04 DIAGNOSIS — R2689 Other abnormalities of gait and mobility: Secondary | ICD-10-CM | POA: Diagnosis not present

## 2017-02-04 DIAGNOSIS — R261 Paralytic gait: Secondary | ICD-10-CM

## 2017-02-04 DIAGNOSIS — R1312 Dysphagia, oropharyngeal phase: Secondary | ICD-10-CM

## 2017-02-04 DIAGNOSIS — R059 Cough, unspecified: Secondary | ICD-10-CM

## 2017-02-04 DIAGNOSIS — M6281 Muscle weakness (generalized): Secondary | ICD-10-CM

## 2017-02-04 DIAGNOSIS — R2681 Unsteadiness on feet: Secondary | ICD-10-CM

## 2017-02-04 DIAGNOSIS — R293 Abnormal posture: Secondary | ICD-10-CM

## 2017-02-04 NOTE — Patient Instructions (Signed)
                                                                           Go Slow [place] Down Alternate solids and liquids Sip and Dip No distractions No solids and liquids in your mouth at the same time Cereal, fruits, soup are liquid - Chin down!

## 2017-02-05 ENCOUNTER — Ambulatory Visit: Payer: Medicare HMO | Admitting: Physical Therapy

## 2017-02-05 DIAGNOSIS — R2689 Other abnormalities of gait and mobility: Secondary | ICD-10-CM | POA: Diagnosis not present

## 2017-02-05 NOTE — Therapy (Signed)
[facility] [address] [place], [place], [address] Phone: [phone]   Fax:  [phone]  Physical Therapy Treatment  Patient Details  Name: Mark [name] MRN: [id number] Date of Birth: January 08, 1946 Referring Provider: Harlan Stains, MD  Encounter Date: 02/04/2017      PT End of Session - 02/04/17 1811    Visit Number 9   Number of Visits 18   Date for PT Re-Evaluation 02/07/17   Authorization Type [employer] G-code & progress note   PT Start Time 1315   PT Stop Time 1400   PT Time Calculation (min) 45 min   Equipment Utilized During Treatment --   Activity Tolerance Patient tolerated treatment well   Behavior During Therapy [facility] for tasks assessed/performed      Past Medical History:  Diagnosis Date  . Chronic ITP (idiopathic thrombocytopenia) (HCC)   . Coronary artery disease 1992   MI  . DVT (deep venous thrombosis) ([place])   . 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 Hypertension   . Lupus anticoagulant disorder ([place])   . Multiple closed anterior-posterior compression fractures of pelvis ([place])   . Seizures ([place])   . Stroke [facility]) 02/27/2011   Left side weakness    Past Surgical History:  Procedure Laterality Date  . blood clot Right    surgical removal  . CHOLECYSTECTOMY    . CORONARY ANGIOPLASTY  1992  . SPLENECTOMY, TOTAL    . vertebralplasty      There were no vitals filed for this visit.      Subjective Assessment - 02/04/17 1313    Subjective No falls.    Patient is accompained by: Family member   Pertinent History CVA spastic hemiplegia, CAD/MI, depression, ADHD, osteoporosis, HTN, lupus, epilepsy, sz, hearing loss,   Limitations Lifting;Standing;Walking;House hold activities   Patient Stated Goals to return his activity level to prior level            [facility] PT Assessment - 02/04/17 1315      Transfers   Transfers Sit to Stand;Stand to Sit   Sit to Stand 6: Modified independent  (Device/Increase time);With upper extremity assist;With armrests;From chair/3-in-1   Stand to Sit 6: Modified independent (Device/Increase time);With upper extremity assist;With armrests;To chair/3-in-1     Ambulation/Gait   Ambulation/Gait Yes   Ambulation/Gait Assistance 5: Supervision   Ambulation/Gait Assistance Details simulated leather toe cap (family plans to have his shoe altered) and theraband spiral wrap to decrease internal rotation.    Ambulation Distance (Feet) 320 Feet   Assistive device Straight cane;Other (Comment)  quad tip on cane, AFO w/heel wedge & T-strap,    Gait Pattern Step-through pattern;Decreased arm swing - left;Decreased step length - right;Decreased stance time - left;Decreased stride length;Decreased hip/knee flexion - left;Decreased weight shift to left;Left hip hike;Left genu recurvatum;Scissoring;Lateral hip instability;Trunk flexed;Narrow base of support;Poor foot clearance - left   Ambulation Surface Indoor;Level;Outdoor;Paved   Gait velocity 1.86 ft/sec    Ramp 5: Supervision  cane with quad tip & AFO   Curb 5: Supervision  cane with quad tip & AFO     Dynamic Gait Index   Level Surface Mild Impairment   Change in Gait Speed Moderate Impairment   Gait with Horizontal Head Turns Mild Impairment   Gait with Vertical Head Turns Mild Impairment   Gait and Pivot Turn Moderate Impairment   Step Over Obstacle Moderate Impairment   Step Around Obstacles Mild Impairment   Steps Moderate Impairment  Total Score 12     Timed Up and Go Test   Normal TUG (seconds) 17.29   Cognitive TUG (seconds) 22.7  31% increasae                               PT Short Term Goals - 01/13/17 2008      PT SHORT TERM GOAL #1   Title Patient ambulates around furniture with cane & AFO performing naming tasks without balance loss.    Baseline MET 01/13/17   Time 1   Period Months   Status Achieved     PT SHORT TERM GOAL #2   Title Patient & wife  verbalize understanding of orthostatic hypotension management techniques.    Baseline MET 01/08/2017   Time 1   Period Months   Status Achieved     PT SHORT TERM GOAL #3   Title Pt will demonstrate understanding of initial HEP.    Baseline MET 01/08/2017   Time 1   Period Months   Status Achieved     PT SHORT TERM GOAL #4   Title Patient's gait velocity with AFO & cane 1.7 ft/sec   Baseline MET 01/13/17 Gait Velocity 1.86 ft/sec   Time 1   Period Months   Status Achieved           PT Long Term Goals - 02/04/17 1811      PT LONG TERM GOAL #1   Title Patient and wife demonstrate and verbalize understanding of ongoing fitness plan.     Baseline MET 02/04/17   Time 2   Period Months   Status Achieved     PT LONG TERM GOAL #2   Title Patient ambulates 300' on outside surfaces including grass with supervision with AFO & assistive device.    Baseline MET 02/04/17   Time 2   Period Months   Status Achieved     PT LONG TERM GOAL #3   Title Patient negotiates ramps, curbs and stairs (1 rail) with AFO & assistive device with supervision.      Baseline MET 02/04/17   Time 2   Period Months   Status Achieved     PT LONG TERM GOAL #4   Title Dynamic Gait Index >/= 12/24 with cane & AFO to indicate lower fall risk.    Baseline MET 02/04/17   Time 2   Period Months   Status Achieved     PT LONG TERM GOAL #5   Title Timed Up & Go with cane <13.5 sec and cognitive TUG <16 sec to indicate lower fall risk.    Baseline NOT MET 02/04/17   Time 2   Period Months   Status Not Met               Plan - 02/04/17 1813    Clinical Impression Statement Patient met 5 of 6 LTGs. He improved mobility back to prior level per wife & patient.    Rehab Potential Good   PT Frequency 2x / week   PT Duration Other (comment)  9 weeks (60 days)   PT Treatment/Interventions ADLs/Self Care Home Management;DME Instruction;Gait training;Stair training;Functional mobility  training;Therapeutic activities;Therapeutic exercise;Balance training;Neuromuscular re-education;Patient/family education;Orthotic Fit/Training   PT Next Visit Plan discharge.   Consulted and Agree with Plan of Care Patient;Family member/caregiver   Family Member Consulted wife      Patient will benefit from skilled therapeutic intervention in order to improve  the following deficits and impairments:  Abnormal gait, Decreased activity tolerance, Decreased balance, Decreased coordination, Decreased endurance, Decreased knowledge of use of DME, Decreased mobility, Decreased range of motion, Decreased strength, Impaired flexibility, Impaired UE functional use, Postural dysfunction, Impaired tone  Visit Diagnosis: Other abnormalities of gait and mobility  Unsteadiness on feet  Paralytic gait  Abnormal posture  Muscle weakness (generalized)       G-Codes - 03-05-2017 06/25/15    Functional Assessment Tool Used (Outpatient Only) Timed Up-Go with cane & AFO 17.29sec and cognitive TUG 22.7sec   Functional Limitation Mobility: Walking and moving around   Mobility: Walking and Moving Around Goal Status [phone]) At least 40 percent but less than 60 percent impaired, limited or restricted   Mobility: Walking and Moving Around Discharge Status [phone]) At least 40 percent but less than 60 percent impaired, limited or restricted      Problem List Patient Active Problem List   Diagnosis Date Noted  . Pulmonary nodule 10/24/2016  . Acute hypoxemic respiratory failure ([place]) 10/21/2016  . Sepsis ([place]) 10/21/2016  . DVT (deep venous thrombosis) ([place]) 10/12/2016  . Supratherapeutic INR 10/12/2016  . Acute deep vein thrombosis (DVT) of popliteal vein of right lower extremity ([place]) 10/12/2016  . AKI (acute kidney injury) ([place]) 10/12/2016  . Fever 10/12/2016  . Leukocytosis 10/12/2016  . Bacteriuria 10/12/2016  . Deep vein thrombosis (DVT) ([place]) 03/02/2015  . Lupus anticoagulant disorder ([place])  03/02/2015  . Ischemic stroke ([place]) 10/04/2014  . 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 Deep vein thrombosis ([place]) 10/04/2014  . 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 LA (lupus anticoagulant) disorder ([place]) 10/04/2014  . Arteriosclerosis of coronary artery 09/10/2012  . Apnea, sleep 09/10/2012  . 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 Dermatophytic onychia 01/29/2012  . Spastic hemiplegia ([place]) 12/03/2011  . Hemiparesis, left ([place]) 10/31/2011  . Dysphonia 08/13/2011  . Idiopathic thrombocytopenic purpura ([place]) 08/12/2011  . ITP (idiopathic thrombocytopenic purpura) 08/12/2011   PHYSICAL THERAPY DISCHARGE SUMMARY  Visits from Start of Care: 9  Current functional level related to goals / functional outcomes: See above   Remaining deficits: See above   Education / Equipment: HEP / leather toe cap on left shoe Plan: Patient agrees to discharge.  Patient goals were partially met. Patient is being discharged due to meeting the stated rehab goals.  ?????         Kayson Tasker PT, DPT 03/05/17, 12:18 PM  [facility] [address] [place], [place], [address] Phone: [phone]   Fax:  [phone]  Name: Mark [name] MRN: [id number] Date of Birth: [birth year]

## 2017-02-06 ENCOUNTER — Ambulatory Visit: Payer: Medicare HMO | Admitting: Speech Pathology

## 2017-02-06 ENCOUNTER — Ambulatory Visit: Payer: Medicare HMO | Admitting: Physical Therapy

## 2017-02-11 ENCOUNTER — Other Ambulatory Visit: Payer: Self-pay | Admitting: *Deleted

## 2017-02-11 DIAGNOSIS — D693 Immune thrombocytopenic purpura: Secondary | ICD-10-CM

## 2017-02-12 ENCOUNTER — Other Ambulatory Visit (HOSPITAL_BASED_OUTPATIENT_CLINIC_OR_DEPARTMENT_OTHER): Payer: Medicare HMO

## 2017-02-12 ENCOUNTER — Other Ambulatory Visit: Payer: Medicare Other

## 2017-02-12 DIAGNOSIS — Z8673 Personal history of transient ischemic attack (TIA), and cerebral infarction without residual deficits: Secondary | ICD-10-CM

## 2017-02-12 DIAGNOSIS — Z7901 Long term (current) use of anticoagulants: Secondary | ICD-10-CM | POA: Diagnosis not present

## 2017-02-12 DIAGNOSIS — D693 Immune thrombocytopenic purpura: Secondary | ICD-10-CM | POA: Diagnosis not present

## 2017-02-12 LAB — CMP (CANCER CENTER ONLY)
ALBUMIN: 3.9 g/dL (ref 3.3–5.5)
ALT: 65 U/L — AB (ref 10–47)
AST: 42 U/L — AB (ref 11–38)
Alkaline Phosphatase: 65 U/L (ref 26–84)
BILIRUBIN TOTAL: 0.6 mg/dL (ref 0.20–1.60)
BUN, Bld: 47 mg/dL — ABNORMAL HIGH (ref 7–22)
CALCIUM: 9.4 mg/dL (ref 8.0–10.3)
CO2: 24 meq/L (ref 18–33)
CREATININE: 1.5 mg/dL — AB (ref 0.6–1.2)
Chloride: 109 mEq/L — ABNORMAL HIGH (ref 98–108)
Glucose, Bld: 165 mg/dL — ABNORMAL HIGH (ref 73–118)
Potassium: 3.8 mEq/L (ref 3.3–4.7)
Sodium: 142 mEq/L (ref 128–145)
Total Protein: 7.8 g/dL (ref 6.4–8.1)

## 2017-02-12 LAB — CBC WITH DIFFERENTIAL (CANCER CENTER ONLY)
BASO#: 0 10*3/uL (ref 0.0–0.2)
BASO%: 0 % (ref 0.0–2.0)
EOS%: 0 % (ref 0.0–7.0)
Eosinophils Absolute: 0 10*3/uL (ref 0.0–0.5)
HEMATOCRIT: 39.8 % (ref 38.7–49.9)
HEMOGLOBIN: 13.4 g/dL (ref 13.0–17.1)
LYMPH#: 2.5 10*3/uL (ref 0.9–3.3)
LYMPH%: 10.3 % — ABNORMAL LOW (ref 14.0–48.0)
MCH: 30.5 pg (ref 28.0–33.4)
MCHC: 33.7 g/dL (ref 32.0–35.9)
MCV: 91 fL (ref 82–98)
MONO#: 3.1 10*3/uL — ABNORMAL HIGH (ref 0.1–0.9)
MONO%: 13 % (ref 0.0–13.0)
NEUT%: 76.7 % (ref 40.0–80.0)
NEUTROS ABS: 18.2 10*3/uL — AB (ref 1.5–6.5)
Platelets: 236 10*3/uL (ref 145–400)
RBC: 4.4 10*6/uL (ref 4.20–5.70)
RDW: 14.4 % (ref 11.1–15.7)
WBC: 23.8 10*3/uL — AB (ref 4.0–10.0)

## 2017-02-12 LAB — PROTIME-INR (CHCC SATELLITE)
INR: 1.2 — AB (ref 2.0–3.5)
Protime: 14.4 Seconds — ABNORMAL HIGH (ref 10.6–13.4)

## 2017-02-13 ENCOUNTER — Ambulatory Visit: Payer: Medicare HMO | Attending: Family Medicine | Admitting: Speech Pathology

## 2017-02-13 DIAGNOSIS — R1312 Dysphagia, oropharyngeal phase: Secondary | ICD-10-CM | POA: Diagnosis present

## 2017-02-13 NOTE — Therapy (Signed)
[facility] [address] [place], [place], [address] Phone: [phone]   Fax:  [phone]  Speech Language Pathology Treatment  Patient Details  Name: Mark [name] MRN: [id number] Date of Birth: 02-12-1946 Referring Provider: Dr. Harlan Stains  Encounter Date: 02/13/2017      End of Session - 02/13/17 1601    Visit Number 6   Number of Visits 17   Date for SLP Re-Evaluation 02/07/17   SLP Start Time 0109   SLP Stop Time  1315   SLP Time Calculation (min) 45 min   Activity Tolerance Patient tolerated treatment well      Past Medical History:  Diagnosis Date  . Chronic ITP (idiopathic thrombocytopenia) (HCC)   . Coronary artery disease 1992   MI  . DVT (deep venous thrombosis) ([place])   . 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 Hypertension   . Lupus anticoagulant disorder ([place])   . Multiple closed anterior-posterior compression fractures of pelvis ([place])   . Seizures ([place])   . Stroke [facility]) 02/27/2011   Left side weakness    Past Surgical History:  Procedure Laterality Date  . blood clot Right    surgical removal  . CHOLECYSTECTOMY    . CORONARY ANGIOPLASTY  1992  . SPLENECTOMY, TOTAL    . vertebralplasty      There were no vitals filed for this visit.      Subjective Assessment - 02/13/17 1241    Subjective "I've been doing the exercises at home"   Patient is accompained by: Family member   Special Tests wife   Currently in Pain? Yes   Pain Score 4    Pain Location Hand   Pain Descriptors / Indicators Throbbing   Pain Type Chronic pain   Pain Onset More than a month ago   Pain Frequency Intermittent               ADULT SLP TREATMENT - 02/13/17 1253      General Information   Behavior/Cognition Alert;Cooperative;Pleasant mood;Impulsive     Treatment Provided   Treatment provided Dysphagia     Dysphagia Treatment   Temperature Spikes Noted No   Respiratory Status Room air   Treatment Methods  Therapeutic exercise;Skilled observation;Patient/caregiver education   Type of PO's observed Regular;Thin liquids   Feeding Able to feed self   Liquids provided via Cup   Other treatment/comments Pt continues to complete HEP for dysphagia with min A to mod I. He requires occasional min A for chin tuck when distractions are present. Visual table top tent reminder provided     Assessment / Recommendations / Plan   Plan Continue with current plan of care     Dysphagia Recommendations   Diet recommendations Regular;Thin liquid   Liquids provided via Cup   Medication Administration Whole meds with puree   Supervision Intermittent supervision to cue for compensatory strategies   Compensations Small sips/bites;Follow solids with liquid   Postural Changes and/or Swallow Maneuvers Chin tuck     Progression Toward Goals   Progression toward goals Not progressing toward goals (comment)  will continue to need min cueing for chin tuck          SLP Education - 02/13/17 1558    Education provided Yes   Education Details no distractions with meals, continue all swallow precations and HEP upon d/c from [place]   Person(s) Educated Patient;Spouse   Methods Explanation;Demonstration   Comprehension Verbalized understanding;Returned demonstration;Verbal cues required;Need further instruction  SPEECH THERAPY DISCHARGE SUMMARY  Visits from Start of Care: 6  Current functional level related to goals / functional outcomes: See goals below   Remaining deficits: Oropharyngeal ; pt will continue to require occasional min cues to follow swallow precautions in distracting environments   Education / Equipment: HEP for dysphagia, swallow precautions, s/s of aspiration pna Plan: Patient agrees to discharge.  Patient goals were partially met. Patient is being discharged due to being pleased with the current functional level.  ?????           SLP Short Term Goals - 02-25-17 1559      SLP SHORT TERM  GOAL #1   Title Pt will follow swallow precautions with rare min A 9/10 trials over three visits   Baseline 01-08-17, occasional min A for chin tuck   Time 2   Period Weeks   Status Not Met     SLP SHORT TERM GOAL #2   Title Pt will perform HEP for dysphagia with occasional min A over 2 sessions   Status Achieved     SLP SHORT TERM GOAL #3   Title Pt/spouse will verbalize s/s of aspiration pna   Time 3   Period Weeks   Status Achieved          SLP Long Term Goals - 02-25-2017 1600      SLP LONG TERM GOAL #1   Title Pt will follow swallow precautions with rare min A outside of therapy over 4 sessions per spouse report.   Baseline occasional min A when distractions are present   Time 6   Period Weeks   Status Partially Met     SLP LONG TERM GOAL #2   Title Pt will perform HEP for dysphagia with rare min A over 2 sessions   Time 6   Period Weeks   Status Achieved        Patient will benefit from skilled therapeutic intervention in order to improve the following deficits and impairments:   Dysphagia, oropharyngeal phase      G-Codes - 2017/02/25 1601    Functional Limitations Swallowing   Swallow Goal Status (O0321) At least 1 percent but less than 20 percent impaired, limited or restricted   Swallow Discharge Status [phone]) At least 1 percent but less than 20 percent impaired, limited or restricted      Problem List Patient Active Problem List   Diagnosis Date Noted  . Pulmonary nodule 10/24/2016  . Acute hypoxemic respiratory failure ([place]) 10/21/2016  . Sepsis ([place]) 10/21/2016  . DVT (deep venous thrombosis) ([place]) 10/12/2016  . Supratherapeutic INR 10/12/2016  . Acute deep vein thrombosis (DVT) of popliteal vein of right lower extremity ([place]) 10/12/2016  . AKI (acute kidney injury) ([place]) 10/12/2016  . Fever 10/12/2016  . Leukocytosis 10/12/2016  . Bacteriuria 10/12/2016  . Deep vein thrombosis (DVT) ([place]) 03/02/2015  . Lupus anticoagulant disorder ([place])  03/02/2015  . Ischemic stroke ([place]) 10/04/2014  . 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 Deep vein thrombosis ([place]) 10/04/2014  . 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 LA (lupus anticoagulant) disorder ([place]) 10/04/2014  . Arteriosclerosis of coronary artery 09/10/2012  . Apnea, sleep 09/10/2012  . 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 Dermatophytic onychia 01/29/2012  . Spastic hemiplegia ([place]) 12/03/2011  . Hemiparesis, left ([place]) 10/31/2011  . Dysphonia 08/13/2011  . Idiopathic thrombocytopenic purpura ([place]) 08/12/2011  . ITP (idiopathic thrombocytopenic purpura) 08/12/2011    Sanskriti Greenlaw, Annye Rusk MS, CCC-SLP 2017/02/25, 4:02 PM  [facility] [address] [place], [place], [address] Phone: [phone]   Fax:  [phone]   Name: ONEIL BEHNEY MRN: [id number] Date of Birth: 1945/04/22

## 2017-02-18 NOTE — Therapy (Signed)
[facility] [address] [place], [place], [address] Phone: [phone]   Fax:  [phone]  Speech Language Pathology Treatment  Patient Details  Name: Mark [name] MRN: [id number] Date of Birth: 10/21/1945 Referring Provider: Dr. Cynthia White   Encounter Date: 02/04/2017    Past Medical History:  Diagnosis Date  . Chronic ITP (idiopathic thrombocytopenia) (HCC)   . Coronary artery disease 1992   MI  . DVT (deep venous thrombosis) (HCC)   . 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 Hypertension   . Lupus anticoagulant disorder (HCC)   . Multiple closed anterior-posterior compression fractures of pelvis (HCC)   . Seizures (HCC)   . Stroke (HCC) 02/27/2011   Left side weakness    Past Surgical History:  Procedure Laterality Date  . blood clot Right    surgical removal  . CHOLECYSTECTOMY    . CORONARY ANGIOPLASTY  1992  . SPLENECTOMY, TOTAL    . vertebralplasty      There were no vitals filed for this visit.             SLP Short Term Goals - 02/13/17 1559      SLP SHORT TERM GOAL #1   Title  Pt will follow swallow precautions with rare min A 9/10 trials over three visits    Baseline  01-08-17, occasional min A for chin tuck    Time  2    Period  Weeks    Status  Not Met      SLP SHORT TERM GOAL #2   Title  Pt will perform HEP for dysphagia with occasional min A over 2 sessions    Status  Achieved      SLP SHORT TERM GOAL #3   Title  Pt/spouse will verbalize s/s of aspiration pna    Time  3    Period  Weeks    Status  Achieved       SLP Long Term Goals - 02/13/17 1600      SLP LONG TERM GOAL #1   Title  Pt will follow swallow precautions with rare min A outside of therapy over 4 sessions per spouse report.    Baseline  occasional min A when distractions are present    Time  6    Period  Weeks    Status  Partially Met      SLP LONG TERM GOAL #2   Title  Pt will perform HEP for dysphagia with  rare min A over 2 sessions    Time  6    Period  Weeks    Status  Achieved         Patient will benefit from skilled therapeutic intervention in order to improve the following deficits and impairments:   Dysphagia, oropharyngeal phase    Problem List Patient Active Problem List   Diagnosis Date Noted  . Pulmonary nodule 10/24/2016  . Acute hypoxemic respiratory failure (HCC) 10/21/2016  . Sepsis (HCC) 10/21/2016  . DVT (deep venous thrombosis) (HCC) 10/12/2016  . Supratherapeutic INR 10/12/2016  . Acute deep vein thrombosis (DVT) of popliteal vein of right lower extremity (HCC) 10/12/2016  . AKI (acute kidney injury) (HCC) 10/12/2016  . Fever 10/12/2016  . Leukocytosis 10/12/2016  . Bacteriuria 10/12/2016  . Deep vein thrombosis (DVT) (HCC) 03/02/2015  . Lupus anticoagulant disorder (HCC) 03/02/2015  . Ischemic stroke (HCC) 10/04/2014  . 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 Deep vein thrombosis (HCC) 10/04/2014  . Immune   thrombocytopenic purpura ([place]) 10/04/2014  . LA (lupus anticoagulant) disorder ([place]) 10/04/2014  . Arteriosclerosis of coronary artery 09/10/2012  . Apnea, sleep 09/10/2012  . 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 Dermatophytic onychia 01/29/2012  . Spastic hemiplegia ([place]) 12/03/2011  . Hemiparesis, left ([place]) 10/31/2011  . Dysphonia 08/13/2011  . Idiopathic thrombocytopenic purpura ([place]) 08/12/2011  . ITP (idiopathic thrombocytopenic purpura) 08/12/2011    Jennessy Sandridge, Annye Rusk MS, CCC-SLP 02/18/2017, 1:34 PM  [facility] [address] [place], [place], [address] Phone: [phone]   Fax:  [phone]   Name: Mark [name] MRN: [id number] Date of Birth: Jan 20, 1946

## 2017-03-04 ENCOUNTER — Other Ambulatory Visit: Payer: Self-pay | Admitting: *Deleted

## 2017-03-04 DIAGNOSIS — I82401 Acute embolism and thrombosis of unspecified deep veins of right lower extremity: Secondary | ICD-10-CM

## 2017-03-05 ENCOUNTER — Other Ambulatory Visit: Payer: Medicare Other

## 2017-03-05 ENCOUNTER — Other Ambulatory Visit: Payer: Medicare HMO

## 2017-03-05 ENCOUNTER — Ambulatory Visit: Payer: Medicare HMO | Admitting: Hematology & Oncology

## 2017-03-10 DIAGNOSIS — N312 Flaccid neuropathic bladder, not elsewhere classified: Secondary | ICD-10-CM | POA: Diagnosis not present

## 2017-03-10 DIAGNOSIS — R972 Elevated prostate specific antigen [PSA]: Secondary | ICD-10-CM | POA: Diagnosis not present

## 2017-03-26 ENCOUNTER — Other Ambulatory Visit: Payer: Medicare Other

## 2017-04-09 ENCOUNTER — Other Ambulatory Visit: Payer: Self-pay | Admitting: Family

## 2017-04-09 ENCOUNTER — Other Ambulatory Visit: Payer: Self-pay

## 2017-04-09 ENCOUNTER — Encounter: Payer: Self-pay | Admitting: Hematology & Oncology

## 2017-04-09 ENCOUNTER — Ambulatory Visit (HOSPITAL_BASED_OUTPATIENT_CLINIC_OR_DEPARTMENT_OTHER): Payer: Medicare HMO | Admitting: Hematology & Oncology

## 2017-04-09 ENCOUNTER — Other Ambulatory Visit (HOSPITAL_BASED_OUTPATIENT_CLINIC_OR_DEPARTMENT_OTHER): Payer: Medicare HMO

## 2017-04-09 VITALS — BP 131/63 | HR 74 | Temp 98.4°F | Resp 19 | Wt 205.0 lb

## 2017-04-09 DIAGNOSIS — D693 Immune thrombocytopenic purpura: Secondary | ICD-10-CM

## 2017-04-09 DIAGNOSIS — I69859 Hemiplegia and hemiparesis following other cerebrovascular disease affecting unspecified side: Secondary | ICD-10-CM

## 2017-04-09 DIAGNOSIS — D6862 Lupus anticoagulant syndrome: Secondary | ICD-10-CM | POA: Diagnosis not present

## 2017-04-09 DIAGNOSIS — Z7901 Long term (current) use of anticoagulants: Secondary | ICD-10-CM

## 2017-04-09 DIAGNOSIS — I82401 Acute embolism and thrombosis of unspecified deep veins of right lower extremity: Secondary | ICD-10-CM

## 2017-04-09 DIAGNOSIS — Z8673 Personal history of transient ischemic attack (TIA), and cerebral infarction without residual deficits: Secondary | ICD-10-CM | POA: Diagnosis not present

## 2017-04-09 DIAGNOSIS — M25551 Pain in right hip: Secondary | ICD-10-CM

## 2017-04-09 LAB — CMP (CANCER CENTER ONLY)
ALBUMIN: 3.5 g/dL (ref 3.3–5.5)
ALK PHOS: 61 U/L (ref 26–84)
ALT(SGPT): 57 U/L — ABNORMAL HIGH (ref 10–47)
AST: 23 U/L (ref 11–38)
BILIRUBIN TOTAL: 0.6 mg/dL (ref 0.20–1.60)
BUN, Bld: 34 mg/dL — ABNORMAL HIGH (ref 7–22)
CALCIUM: 9.1 mg/dL (ref 8.0–10.3)
CO2: 23 mEq/L (ref 18–33)
Chloride: 108 mEq/L (ref 98–108)
Creat: 1.3 mg/dl — ABNORMAL HIGH (ref 0.6–1.2)
Glucose, Bld: 119 mg/dL — ABNORMAL HIGH (ref 73–118)
Potassium: 4 mEq/L (ref 3.3–4.7)
Sodium: 146 mEq/L — ABNORMAL HIGH (ref 128–145)
Total Protein: 6.8 g/dL (ref 6.4–8.1)

## 2017-04-09 LAB — CBC WITH DIFFERENTIAL (CANCER CENTER ONLY)
BASO#: 0 10*3/uL (ref 0.0–0.2)
BASO%: 0.2 % (ref 0.0–2.0)
EOS%: 5.2 % (ref 0.0–7.0)
Eosinophils Absolute: 0.7 10*3/uL — ABNORMAL HIGH (ref 0.0–0.5)
HEMATOCRIT: 38 % — AB (ref 38.7–49.9)
HEMOGLOBIN: 12.8 g/dL — AB (ref 13.0–17.1)
LYMPH#: 4 10*3/uL — AB (ref 0.9–3.3)
LYMPH%: 28.5 % (ref 14.0–48.0)
MCH: 30.7 pg (ref 28.0–33.4)
MCHC: 33.7 g/dL (ref 32.0–35.9)
MCV: 91 fL (ref 82–98)
MONO#: 2.1 10*3/uL — AB (ref 0.1–0.9)
MONO%: 15.4 % — ABNORMAL HIGH (ref 0.0–13.0)
NEUT%: 50.7 % (ref 40.0–80.0)
NEUTROS ABS: 7 10*3/uL — AB (ref 1.5–6.5)
Platelets: 281 10*3/uL (ref 145–400)
RBC: 4.17 10*6/uL — ABNORMAL LOW (ref 4.20–5.70)
RDW: 15.6 % (ref 11.1–15.7)
WBC: 13.9 10*3/uL — ABNORMAL HIGH (ref 4.0–10.0)

## 2017-04-09 LAB — PROTIME-INR (CHCC SATELLITE)
INR: 1.2 — ABNORMAL LOW (ref 2.0–3.5)
Protime: 14.4 Seconds — ABNORMAL HIGH (ref 10.6–13.4)

## 2017-04-09 NOTE — Progress Notes (Signed)
Hematology and Oncology Follow Up Visit  Mark [name] [id number] 08/07/1945 71 y.o. 04/09/2017   Principle Diagnosis:  1. Refractory immune thrombocytopenia. 2. 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 3. Positive lupus anticoagulant.  Current Therapy:   Xarelto 20 mg PO daily   Interim History:  Mark [name] is here today with his grandson for follow-up.  He is having some problems with his right hip.  He apparently had a fall.  He sees an orthopedist next week.  I do not know if he is going to need surgery for this.  The weakness in his left side really is no different.  He is on Xarelto.  He has had no problems with thrombocytopenia.  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  When he does, he does have him on Decadron which works very well.  He has had no bleeding.  He has had no fever.  He has had no nausea or vomiting.  He has had no visual changes.  Overall, his performance status is ECOG 2.  Medications:  Allergies as of 04/09/2017   No Known Allergies     Medication List        Accurate as of 04/09/17  4:53 PM. Always use your most recent med list.          acetaminophen 500 MG tablet Commonly known as:  TYLENOL Take 1 tablet (500 mg total) by mouth every 6 (six) hours as needed (pain).   alendronate 70 MG tablet Commonly known as:  FOSAMAX Take 70 mg by mouth every Sunday. Take with a full glass of water on an empty stomach.   amLODipine 5 MG tablet Commonly known as:  NORVASC   aspirin 81 MG chewable tablet Chew 1 tablet (81 mg total) by mouth at bedtime.   bethanechol 25 MG tablet Commonly known as:  URECHOLINE   buPROPion 150 MG 24 hr tablet Commonly known as:  WELLBUTRIN XL Take 150 mg by mouth daily.   CRESTOR 20 MG tablet Generic drug:  rosuvastatin Take 20 mg by mouth at bedtime.   dexamethasone 4 MG tablet Commonly known as:  DECADRON   famotidine 40 MG  tablet Commonly known as:  PEPCID Take 40 mg by mouth 2 (two) times daily.   fluticasone 50 MCG/ACT nasal spray Commonly known as:  FLONASE Place 2 sprays into both nostrils daily as needed for allergies or rhinitis.   levETIRAcetam 500 MG tablet Commonly known as:  KEPPRA Take 1,500 mg by mouth 2 (two) times daily. 3 tabs  in am and 3 tabs in pm   multivitamin with minerals tablet Take 1 tablet by mouth 2 (two) times daily.   nystatin powder Generic drug:  nystatin   polyethylene glycol packet Commonly known as:  MIRALAX / GLYCOLAX Take 17 g by mouth daily as needed.   rivaroxaban 20 MG Tabs tablet Commonly known as:  XARELTO Take 1 tablet (20 mg total) by mouth daily with breakfast.   sertraline 100 MG tablet Commonly known as:  ZOLOFT Take 200 mg by mouth daily.   tamsulosin 0.4 MG Caps capsule Commonly known as:  FLOMAX Take 0.8 mg by mouth at bedtime.   traMADol 50 MG tablet Commonly known as:  ULTRAM Take 50 mg by mouth every 6 (six) hours as needed (pain).   Vitamin D3 1000 units Caps Take 2,000 Units by mouth 2 (two) times daily after a meal.  Allergies: No Known Allergies  Past Medical History, Surgical history, Social history, and Family History were reviewed and updated.  Review of Systems: As stated in the interim history  Physical Exam:  weight is 205 lb (93 kg). His oral temperature is 98.4 F (36.9 C). His blood pressure is 131/63 and his pulse is 74. His respiration is 19 and oxygen saturation is 98%.   Wt Readings from Last 3 Encounters:  04/09/17 205 lb (93 kg)  12/05/16 205 lb (93 kg)  10/22/16 212 lb 1.3 oz (96.2 kg)    Well-developed and well-nourished white male.  He has weakness on the left side.  He does not have a lot of use of his left arm.  Right arm is a little weak.  His head neck exam shows no ocular or oral lesions.  There are no palpable cervical or supraclavicular lymph nodes.  Lungs are clear bilaterally.  Cardiac  exam regular rate and rhythm with no murmurs, rubs or bruits.  Abdomen is soft.  He is mildly obese.  He has no fluid wave.  There is no palpable liver edge.  There is no palpable splenomegaly.  Extremities shows the weakness on the left side.  He has very little use of his left arm.  His left leg is not as weak.  He has tenderness to palpation in the lateral right hip.  Neurological exam is as stated above with the chronic neurological deficiencies.  Lab Results  Component Value Date   WBC 13.9 (H) 04/09/2017   HGB 12.8 (L) 04/09/2017   HCT 38.0 (L) 04/09/2017   MCV 91 04/09/2017   PLT 281 04/09/2017   Lab Results  Component Value Date   FERRITIN 1,063 (H) 10/23/2016   IRON 27 (L) 10/23/2016   TIBC 160 (L) 10/23/2016   UIBC 133 10/23/2016   IRONPCTSAT 17 (L) 10/23/2016   Lab Results  Component Value Date   RETICCTPCT 1.5 08/20/2010   RBC 4.17 (L) 04/09/2017   RETICCTABS 75.3 08/20/2010   No results found for: KPAFRELGTCHN, LAMBDASER, KAPLAMBRATIO No results found for: IGGSERUM, IGA, IGMSERUM No results found for: Kathrynn Ducking, MSPIKE, SPEI   Chemistry      Component Value Date/Time   NA 146 (H) 04/09/2017 1405   NA 138 05/09/2016 1321   K 4.0 04/09/2017 1405   K 4.3 05/09/2016 1321   CL 108 04/09/2017 1405   CO2 23 04/09/2017 1405   CO2 22 05/09/2016 1321   BUN 34 (H) 04/09/2017 1405   BUN 36.4 (H) 05/09/2016 1321   CREATININE 1.3 (H) 04/09/2017 1405   CREATININE 1.5 (H) 05/09/2016 1321      Component Value Date/Time   CALCIUM 9.1 04/09/2017 1405   CALCIUM 9.9 05/09/2016 1321   ALKPHOS 61 04/09/2017 1405   ALKPHOS 103 05/09/2016 1321   AST 23 04/09/2017 1405   AST 39 (H) 05/09/2016 1321   ALT 57 (H) 04/09/2017 1405   ALT 64 (H) 05/09/2016 1321   BILITOT 0.60 04/09/2017 1405   BILITOT 0.49 05/09/2016 1321      Impression and Plan: Mark [name] is a very pleasant 71 yo caucasian gentleman with refractory immune  thrombocytopenia. 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 He is now on Xarelto and doing well. No bleeding, bruising or petechiae.   It sounds like he is going to need right hip surgery.  He is having quite a bit of pain.  I do not see  any problem with him having surgery if he needs hip repair.  We can get him through anticoagulation issues.  I would like to see him back in about 3 weeks just so that I can stay in close follow-up with him as he is having issues with his right hip.  Volanda Napoleon, MD 12/26/20184:53 PM

## 2017-04-22 DIAGNOSIS — M1611 Unilateral primary osteoarthritis, right hip: Secondary | ICD-10-CM | POA: Diagnosis not present

## 2017-04-22 DIAGNOSIS — M545 Low back pain: Secondary | ICD-10-CM | POA: Diagnosis not present

## 2017-04-28 ENCOUNTER — Inpatient Hospital Stay: Payer: Medicare HMO | Attending: Hematology & Oncology | Admitting: Hematology & Oncology

## 2017-04-28 ENCOUNTER — Inpatient Hospital Stay: Payer: Medicare HMO

## 2017-04-28 ENCOUNTER — Other Ambulatory Visit: Payer: Self-pay | Admitting: *Deleted

## 2017-04-28 ENCOUNTER — Other Ambulatory Visit: Payer: Self-pay

## 2017-04-28 VITALS — BP 111/69 | HR 71 | Temp 97.3°F | Resp 20 | Wt 206.0 lb

## 2017-04-28 DIAGNOSIS — Z7901 Long term (current) use of anticoagulants: Secondary | ICD-10-CM

## 2017-04-28 DIAGNOSIS — D693 Immune thrombocytopenic purpura: Secondary | ICD-10-CM

## 2017-04-28 DIAGNOSIS — D6862 Lupus anticoagulant syndrome: Secondary | ICD-10-CM

## 2017-04-28 DIAGNOSIS — I639 Cerebral infarction, unspecified: Secondary | ICD-10-CM

## 2017-04-28 DIAGNOSIS — I69354 Hemiplegia and hemiparesis following cerebral infarction affecting left non-dominant side: Secondary | ICD-10-CM | POA: Diagnosis not present

## 2017-04-28 DIAGNOSIS — I82401 Acute embolism and thrombosis of unspecified deep veins of right lower extremity: Secondary | ICD-10-CM

## 2017-04-28 LAB — CMP (CANCER CENTER ONLY)
ALT: 30 U/L (ref 0–55)
ANION GAP: 10 (ref 5–15)
AST: 27 U/L (ref 5–34)
Albumin: 3.9 g/dL (ref 3.5–5.0)
Alkaline Phosphatase: 78 U/L (ref 26–84)
BILIRUBIN TOTAL: 0.6 mg/dL (ref 0.2–1.2)
BUN: 26 mg/dL — ABNORMAL HIGH (ref 7–22)
CO2: 30 mmol/L (ref 18–33)
Calcium: 9.3 mg/dL (ref 8.0–10.3)
Chloride: 104 mmol/L (ref 98–108)
Creatinine: 1.7 mg/dL — ABNORMAL HIGH (ref 0.70–1.30)
GLUCOSE: 100 mg/dL (ref 70–118)
POTASSIUM: 4.2 mmol/L (ref 3.3–4.7)
Sodium: 144 mmol/L (ref 128–145)
TOTAL PROTEIN: 7.7 g/dL (ref 6.4–8.1)

## 2017-04-28 LAB — CBC WITH DIFFERENTIAL (CANCER CENTER ONLY)
BASOS PCT: 1 %
Basophils Absolute: 0.1 10*3/uL (ref 0.0–0.1)
Eosinophils Absolute: 0.7 10*3/uL — ABNORMAL HIGH (ref 0.0–0.5)
Eosinophils Relative: 7 %
HEMATOCRIT: 41.1 % (ref 38.7–49.9)
HEMOGLOBIN: 13.7 g/dL (ref 13.0–17.1)
LYMPHS PCT: 29 %
Lymphs Abs: 2.9 10*3/uL (ref 0.9–3.3)
MCH: 30.9 pg (ref 28.0–33.4)
MCHC: 33.3 g/dL (ref 32.0–35.9)
MCV: 92.6 fL (ref 82.0–98.0)
MONO ABS: 1.6 10*3/uL — AB (ref 0.1–0.9)
Monocytes Relative: 16 %
NEUTROS ABS: 4.8 10*3/uL (ref 1.5–6.5)
NEUTROS PCT: 47 %
Platelet Count: 280 10*3/uL (ref 140–400)
RBC: 4.44 MIL/uL (ref 4.20–5.70)
RDW: 15.6 % (ref 11.1–15.7)
WBC Count: 10 10*3/uL (ref 4.0–10.3)

## 2017-04-28 MED ORDER — TRAMADOL HCL 50 MG PO TABS: 50.0000 mg | ORAL_TABLET | Freq: Four times a day (QID) | ORAL | 0 refills | Status: DC | PRN

## 2017-04-28 NOTE — Progress Notes (Signed)
Hematology and Oncology Follow Up Visit  Mark [name] [id number] Apr 15, 1946 72 y.o. 04/28/2017   Principle Diagnosis:  1. Refractory immune thrombocytopenia. 2. 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 3. Positive lupus anticoagulant.  Current Therapy:   Xarelto 20 mg PO daily   Interim History:  Mark [name] is here today with his wife for follow-up.  It sounds like he has bursitis in his right hip.  He is going for a hip injection tomorrow.  He has not taken his Xarelto today.  I told him not to take Xarelto today or tomorrow and then to start back up on Wednesday.  I would think that with just a injection, he should not have any problems with bleeding.  He does look better.  He has had no problems with bleeding.  His platelet count has come up quite nicely.  He is watching his blood sugars closely.  He has had no issues with respect to neurological changes.  His wife wants to try to get him swimming.  He likes to swim.  I think this would be a great idea for him.  Overall, his performance status is ECOG 2.  Medications:  Allergies as of 04/28/2017   No Known Allergies     Medication List        Accurate as of 04/28/17  5:11 PM. Always use your most recent med list.          acetaminophen 500 MG tablet Commonly known as:  TYLENOL Take 1 tablet (500 mg total) by mouth every 6 (six) hours as needed (pain).   alendronate 70 MG tablet Commonly known as:  FOSAMAX Take 70 mg by mouth every Sunday. Take with a full glass of water on an empty stomach.   amLODipine 5 MG tablet Commonly known as:  NORVASC   aspirin 81 MG chewable tablet Chew 1 tablet (81 mg total) by mouth at bedtime.   bethanechol 25 MG tablet Commonly known as:  URECHOLINE   buPROPion 150 MG 24 hr tablet Commonly known as:  WELLBUTRIN XL Take 150 mg by mouth daily.   CRESTOR 20 MG tablet Generic drug:  rosuvastatin Take 20 mg by mouth at bedtime.   dexamethasone 4  MG tablet Commonly known as:  DECADRON As needed for ITP   famotidine 40 MG tablet Commonly known as:  PEPCID Take 40 mg by mouth 2 (two) times daily.   fluticasone 50 MCG/ACT nasal spray Commonly known as:  FLONASE Place 2 sprays into both nostrils daily as needed for allergies or rhinitis.   levETIRAcetam 500 MG tablet Commonly known as:  KEPPRA Take 1,500 mg by mouth 2 (two) times daily. 3 tabs  in am and 3 tabs in pm   multivitamin with minerals tablet Take 1 tablet by mouth 2 (two) times daily.   nystatin powder Generic drug:  nystatin   polyethylene glycol packet Commonly known as:  MIRALAX / GLYCOLAX Take 17 g by mouth daily as needed.   rivaroxaban 20 MG Tabs tablet Commonly known as:  XARELTO Take 1 tablet (20 mg total) by mouth daily with breakfast.   sertraline 100 MG tablet Commonly known as:  ZOLOFT Take 200 mg by mouth daily.   tamsulosin 0.4 MG Caps capsule Commonly known as:  FLOMAX Take 0.8 mg by mouth at bedtime.   traMADol 50 MG tablet Commonly known as:  ULTRAM Take 1 tablet (50 mg total) by mouth every 6 (six) hours as needed (pain).   Vitamin D3  1000 units Caps Take 2,000 Units by mouth 2 (two) times daily after a meal.       Allergies: No Known Allergies  Past Medical History, Surgical history, Social history, and Family History were reviewed and updated.  Review of Systems: Review of Systems  HENT: Negative.   Eyes: Negative.   Respiratory: Negative.   Cardiovascular: Negative.   Gastrointestinal: Negative.   Genitourinary: Negative.   Musculoskeletal: Positive for joint pain.  Skin: Negative.   Neurological: Positive for focal weakness.  Endo/Heme/Allergies: Negative.   Psychiatric/Behavioral: Negative.      Physical Exam:  weight is 206 lb (93.4 kg). His oral temperature is 97.3 F (36.3 C) (abnormal). His blood pressure is 111/69 and his pulse is 71. His respiration is 20 and oxygen saturation is 95%.   Wt Readings  from Last 3 Encounters:  04/28/17 206 lb (93.4 kg)  04/09/17 205 lb (93 kg)  12/05/16 205 lb (93 kg)    Physical Exam  Constitutional: He is oriented to person, place, and time.  HENT:  Head: Normocephalic and atraumatic.  Mouth/Throat: Oropharynx is clear and moist.  Eyes: EOM are normal. Pupils are equal, round, and reactive to light.  Neck: Normal range of motion.  Cardiovascular: Normal rate, regular rhythm and normal heart sounds.  Pulmonary/Chest: Effort normal and breath sounds normal.  Abdominal: Soft. Bowel sounds are normal.  Musculoskeletal: Normal range of motion. He exhibits no edema, tenderness or deformity.  Lymphadenopathy:    He has no cervical adenopathy.  Neurological: He is alert and oriented to person, place, and time.  Skin: Skin is warm and dry. No rash noted. No erythema.  Psychiatric: He has a normal mood and affect. His behavior is normal. Judgment and thought content normal.  Vitals reviewed.   Lab Results  Component Value Date   WBC 13.9 (H) 04/09/2017   HGB 12.8 (L) 04/09/2017   HCT 41.1 04/28/2017   MCV 92.6 04/28/2017   PLT 281 04/09/2017   Lab Results  Component Value Date   FERRITIN 1,063 (H) 10/23/2016   IRON 27 (L) 10/23/2016   TIBC 160 (L) 10/23/2016   UIBC 133 10/23/2016   IRONPCTSAT 17 (L) 10/23/2016   Lab Results  Component Value Date   RETICCTPCT 1.5 08/20/2010   RBC 4.44 04/28/2017   RETICCTABS 75.3 08/20/2010   No results found for: KPAFRELGTCHN, LAMBDASER, KAPLAMBRATIO No results found for: IGGSERUM, IGA, IGMSERUM No results found for: Odetta Pink, SPEI   Chemistry      Component Value Date/Time   NA 144 04/28/2017 1402   NA 146 (H) 04/09/2017 1405   NA 138 05/09/2016 1321   K 4.2 04/28/2017 1402   K 4.0 04/09/2017 1405   K 4.3 05/09/2016 1321   CL 104 04/28/2017 1402   CL 108 04/09/2017 1405   CO2 30 04/28/2017 1402   CO2 23 04/09/2017 1405   CO2 22  05/09/2016 1321   BUN 26 (H) 04/28/2017 1402   BUN 34 (H) 04/09/2017 1405   BUN 36.4 (H) 05/09/2016 1321   CREATININE 1.3 (H) 04/09/2017 1405   CREATININE 1.5 (H) 05/09/2016 1321      Component Value Date/Time   CALCIUM 9.3 04/28/2017 1402   CALCIUM 9.1 04/09/2017 1405   CALCIUM 9.9 05/09/2016 1321   ALKPHOS 78 04/28/2017 1402   ALKPHOS 61 04/09/2017 1405   ALKPHOS 103 05/09/2016 1321   AST 27 04/28/2017 1402   AST 39 (H) 05/09/2016 1321  ALT 30 04/28/2017 1402   ALT 57 (H) 04/09/2017 1405   ALT 64 (H) 05/09/2016 1321   BILITOT 0.6 04/28/2017 1402   BILITOT 0.49 05/09/2016 1321      Impression and Plan: Mark [name] is a very pleasant 72 yo caucasian gentleman with refractory immune thrombocytopenia. 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 He is now on Xarelto and doing well. No bleeding, bruising or petechiae.   Hopefully, the injection into the right hip will help.  Otherwise, I think he is doing quite well.  We always have to watch his platelet count.  A lot of times, the platelet will go down.  If so, he just takes steroids with Decadron.  I think we can probably get him back in 6 weeks now.   Volanda Napoleon, MD 1/14/20195:11 PM

## 2017-04-29 DIAGNOSIS — M1611 Unilateral primary osteoarthritis, right hip: Secondary | ICD-10-CM | POA: Diagnosis not present

## 2017-04-30 LAB — LUPUS ANTICOAGULANT PANEL
DRVVT: 175.1 s — ABNORMAL HIGH (ref 0.0–47.0)
PTT LA: 112.3 s — AB (ref 0.0–51.9)

## 2017-04-30 LAB — DRVVT MIX: dRVVT Mix: 107 s — ABNORMAL HIGH (ref 0.0–47.0)

## 2017-04-30 LAB — PTT-LA MIX: PTT-LA Mix: 83.9 s — ABNORMAL HIGH (ref 0.0–48.9)

## 2017-04-30 LAB — DRVVT CONFIRM: dRVVT Confirm: 2.7 ratio — ABNORMAL HIGH (ref 0.8–1.2)

## 2017-04-30 LAB — HEXAGONAL PHASE PHOSPHOLIPID: HEXAGONAL PHASE PHOSPHOLIPID: 54 s — AB (ref 0–11)

## 2017-05-03 ENCOUNTER — Other Ambulatory Visit: Payer: Self-pay | Admitting: Family Medicine

## 2017-05-03 DIAGNOSIS — R911 Solitary pulmonary nodule: Secondary | ICD-10-CM

## 2017-05-08 DIAGNOSIS — D693 Immune thrombocytopenic purpura: Secondary | ICD-10-CM | POA: Diagnosis not present

## 2017-05-08 DIAGNOSIS — N183 Chronic kidney disease, stage 3 (moderate): Secondary | ICD-10-CM | POA: Diagnosis not present

## 2017-05-08 DIAGNOSIS — M21372 Foot drop, left foot: Secondary | ICD-10-CM | POA: Diagnosis not present

## 2017-05-08 DIAGNOSIS — R69 Illness, unspecified: Secondary | ICD-10-CM | POA: Diagnosis not present

## 2017-05-08 DIAGNOSIS — I129 Hypertensive chronic kidney disease with stage 1 through stage 4 chronic kidney disease, or unspecified chronic kidney disease: Secondary | ICD-10-CM | POA: Diagnosis not present

## 2017-05-08 DIAGNOSIS — E785 Hyperlipidemia, unspecified: Secondary | ICD-10-CM | POA: Diagnosis not present

## 2017-05-08 DIAGNOSIS — I69352 Hemiplegia and hemiparesis following cerebral infarction affecting left dominant side: Secondary | ICD-10-CM | POA: Diagnosis not present

## 2017-05-08 DIAGNOSIS — D68312 Antiphospholipid antibody with hemorrhagic disorder: Secondary | ICD-10-CM | POA: Diagnosis not present

## 2017-05-09 ENCOUNTER — Ambulatory Visit
Admission: RE | Admit: 2017-05-09 | Discharge: 2017-05-09 | Disposition: A | Discharge status: Home / Self Care | Payer: Medicare HMO | Source: Ambulatory Visit | Attending: Family Medicine | Admitting: Family Medicine

## 2017-05-09 DIAGNOSIS — R911 Solitary pulmonary nodule: Secondary | ICD-10-CM | POA: Diagnosis not present

## 2017-05-26 DIAGNOSIS — M1611 Unilateral primary osteoarthritis, right hip: Secondary | ICD-10-CM | POA: Diagnosis not present

## 2017-05-27 DIAGNOSIS — M21372 Foot drop, left foot: Secondary | ICD-10-CM | POA: Diagnosis not present

## 2017-06-05 DIAGNOSIS — D693 Immune thrombocytopenic purpura: Secondary | ICD-10-CM | POA: Diagnosis not present

## 2017-06-05 DIAGNOSIS — Z8673 Personal history of transient ischemic attack (TIA), and cerebral infarction without residual deficits: Secondary | ICD-10-CM | POA: Diagnosis not present

## 2017-06-05 DIAGNOSIS — I2510 Atherosclerotic heart disease of native coronary artery without angina pectoris: Secondary | ICD-10-CM | POA: Diagnosis not present

## 2017-06-05 DIAGNOSIS — R760 Raised antibody titer: Secondary | ICD-10-CM | POA: Diagnosis not present

## 2017-06-05 DIAGNOSIS — E785 Hyperlipidemia, unspecified: Secondary | ICD-10-CM | POA: Diagnosis not present

## 2017-06-05 DIAGNOSIS — I129 Hypertensive chronic kidney disease with stage 1 through stage 4 chronic kidney disease, or unspecified chronic kidney disease: Secondary | ICD-10-CM | POA: Diagnosis not present

## 2017-06-05 DIAGNOSIS — N183 Chronic kidney disease, stage 3 (moderate): Secondary | ICD-10-CM | POA: Diagnosis not present

## 2017-06-05 DIAGNOSIS — K219 Gastro-esophageal reflux disease without esophagitis: Secondary | ICD-10-CM | POA: Diagnosis not present

## 2017-06-11 ENCOUNTER — Ambulatory Visit: Payer: Medicare HMO | Admitting: Hematology & Oncology

## 2017-06-17 DIAGNOSIS — H04123 Dry eye syndrome of bilateral lacrimal glands: Secondary | ICD-10-CM | POA: Diagnosis not present

## 2017-06-17 DIAGNOSIS — Z961 Presence of intraocular lens: Secondary | ICD-10-CM | POA: Diagnosis not present

## 2017-06-18 ENCOUNTER — Inpatient Hospital Stay: Payer: Medicare HMO | Admitting: Hematology & Oncology

## 2017-06-18 ENCOUNTER — Inpatient Hospital Stay: Payer: Medicare HMO

## 2017-06-23 DIAGNOSIS — Z1211 Encounter for screening for malignant neoplasm of colon: Secondary | ICD-10-CM | POA: Diagnosis not present

## 2017-06-23 DIAGNOSIS — K219 Gastro-esophageal reflux disease without esophagitis: Secondary | ICD-10-CM | POA: Diagnosis not present

## 2017-06-23 DIAGNOSIS — R1312 Dysphagia, oropharyngeal phase: Secondary | ICD-10-CM | POA: Diagnosis not present

## 2017-06-26 ENCOUNTER — Other Ambulatory Visit: Payer: Self-pay | Admitting: *Deleted

## 2017-06-26 ENCOUNTER — Inpatient Hospital Stay (HOSPITAL_BASED_OUTPATIENT_CLINIC_OR_DEPARTMENT_OTHER): Payer: Medicare HMO | Admitting: Hematology & Oncology

## 2017-06-26 ENCOUNTER — Inpatient Hospital Stay: Payer: Medicare HMO | Attending: Hematology & Oncology

## 2017-06-26 VITALS — BP 133/70 | HR 68 | Temp 98.1°F | Resp 18 | Wt 199.0 lb

## 2017-06-26 DIAGNOSIS — D693 Immune thrombocytopenic purpura: Secondary | ICD-10-CM

## 2017-06-26 DIAGNOSIS — D6862 Lupus anticoagulant syndrome: Secondary | ICD-10-CM | POA: Insufficient documentation

## 2017-06-26 DIAGNOSIS — I639 Cerebral infarction, unspecified: Secondary | ICD-10-CM

## 2017-06-26 DIAGNOSIS — Z7901 Long term (current) use of anticoagulants: Secondary | ICD-10-CM | POA: Insufficient documentation

## 2017-06-26 DIAGNOSIS — I82401 Acute embolism and thrombosis of unspecified deep veins of right lower extremity: Secondary | ICD-10-CM

## 2017-06-26 LAB — CBC WITH DIFFERENTIAL (CANCER CENTER ONLY)
BASOS PCT: 0 %
Basophils Absolute: 0 10*3/uL (ref 0.0–0.1)
EOS ABS: 0.4 10*3/uL (ref 0.0–0.5)
Eosinophils Relative: 4 %
HCT: 42.7 % (ref 38.7–49.9)
HEMOGLOBIN: 14.1 g/dL (ref 13.0–17.1)
LYMPHS ABS: 3.4 10*3/uL — AB (ref 0.9–3.3)
Lymphocytes Relative: 34 %
MCH: 30.8 pg (ref 28.0–33.4)
MCHC: 33 g/dL (ref 32.0–35.9)
MCV: 93.2 fL (ref 82.0–98.0)
Monocytes Absolute: 2 10*3/uL — ABNORMAL HIGH (ref 0.1–0.9)
Monocytes Relative: 20 %
NEUTROS PCT: 42 %
Neutro Abs: 4.1 10*3/uL (ref 1.5–6.5)
Platelet Count: 214 10*3/uL (ref 145–400)
RBC: 4.58 MIL/uL (ref 4.20–5.70)
RDW: 14.4 % (ref 11.1–15.7)
WBC Count: 10 10*3/uL (ref 4.0–10.0)

## 2017-06-26 LAB — CMP (CANCER CENTER ONLY)
ALBUMIN: 4.3 g/dL (ref 3.5–5.0)
ALK PHOS: 69 U/L (ref 26–84)
ALT: 35 U/L (ref 10–47)
AST: 27 U/L (ref 11–38)
Anion gap: 10 (ref 5–15)
BILIRUBIN TOTAL: 0.6 mg/dL (ref 0.2–1.6)
BUN: 32 mg/dL — ABNORMAL HIGH (ref 7–22)
CALCIUM: 9.8 mg/dL (ref 8.0–10.3)
CHLORIDE: 103 mmol/L (ref 98–108)
CO2: 32 mmol/L (ref 18–33)
CREATININE: 1.4 mg/dL — AB (ref 0.60–1.20)
Glucose, Bld: 83 mg/dL (ref 73–118)
Potassium: 4.4 mmol/L (ref 3.3–4.7)
Sodium: 145 mmol/L (ref 128–145)
Total Protein: 8.1 g/dL (ref 6.4–8.1)

## 2017-06-26 NOTE — Progress Notes (Signed)
Neuro

## 2017-06-26 NOTE — Progress Notes (Signed)
Hematology and Oncology Follow Up Visit  TONIE VIZCARRONDO [id number] May 10, 1945 72 y.o. 06/26/2017   Principle Diagnosis:  1. Refractory immune thrombocytopenia. 2. 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 3. Positive lupus anticoagulant.  Current Therapy:   Xarelto 20 mg PO daily   Interim History:  Mr. Drum is here today with his grandson for follow-up.  He seems to be doing pretty well.  He has a new attachment to his rolling walker.  This is an armrest.  I think this is a really great idea.  Looks like he might benefit from some more physical therapy.  He is having some difficulty walking and trying to keep his legs straight.  His grandson is worried that he might trip and fall.  Being on blood thinner this would not be a good idea.  His appetite is doing okay.  He has had no problems with nausea or vomiting.  He is not complaining of any pain in the back.  He has had no bleeding.  He is doing very nicely on the Xarelto.  There is no change in bowel or bladder habits.  He has had no incontinence.  Overall, his performance status is ECOG 2.   Medications:  Allergies as of 06/26/2017   No Known Allergies     Medication List        Accurate as of 06/26/17  3:51 PM. Always use your most recent med list.          acetaminophen 500 MG tablet Commonly known as:  TYLENOL Take 1 tablet (500 mg total) by mouth every 6 (six) hours as needed (pain).   alendronate 70 MG tablet Commonly known as:  FOSAMAX Take 70 mg by mouth every Sunday. Take with a full glass of water on an empty stomach.   amLODipine 5 MG tablet Commonly known as:  NORVASC   aspirin 81 MG chewable tablet Chew 1 tablet (81 mg total) by mouth at bedtime.   bethanechol 25 MG tablet Commonly known as:  URECHOLINE   buPROPion 150 MG 24 hr tablet Commonly known as:  WELLBUTRIN XL Take 150 mg by mouth daily.   CRESTOR 20 MG tablet Generic drug:  rosuvastatin Take 20 mg by  mouth at bedtime.   dexamethasone 4 MG tablet Commonly known as:  DECADRON As needed for ITP   famotidine 40 MG tablet Commonly known as:  PEPCID Take 40 mg by mouth 2 (two) times daily.   fluticasone 50 MCG/ACT nasal spray Commonly known as:  FLONASE Place 2 sprays into both nostrils daily as needed for allergies or rhinitis.   levETIRAcetam 500 MG tablet Commonly known as:  KEPPRA Take 1,500 mg by mouth 2 (two) times daily. 3 tabs  in am and 3 tabs in pm   multivitamin with minerals tablet Take 1 tablet by mouth 2 (two) times daily.   nystatin powder Generic drug:  nystatin   polyethylene glycol packet Commonly known as:  MIRALAX / GLYCOLAX Take 17 g by mouth daily as needed.   rivaroxaban 20 MG Tabs tablet Commonly known as:  XARELTO Take 1 tablet (20 mg total) by mouth daily with breakfast.   sertraline 100 MG tablet Commonly known as:  ZOLOFT Take 200 mg by mouth daily.   tamsulosin 0.4 MG Caps capsule Commonly known as:  FLOMAX Take 0.8 mg by mouth at bedtime.   traMADol 50 MG tablet Commonly known as:  ULTRAM Take 1 tablet (50 mg total) by mouth every 6 (six)  hours as needed (pain).   Vitamin D3 1000 units Caps Take 2,000 Units by mouth 2 (two) times daily after a meal.       Allergies: No Known Allergies  Past Medical History, Surgical history, Social history, and Family History were reviewed and updated.  Review of Systems: Review of Systems  HENT: Negative.   Eyes: Negative.   Respiratory: Negative.   Cardiovascular: Negative.   Gastrointestinal: Negative.   Genitourinary: Negative.   Musculoskeletal: Positive for joint pain.  Skin: Negative.   Neurological: Positive for focal weakness.  Endo/Heme/Allergies: Negative.   Psychiatric/Behavioral: Negative.      Physical Exam:  weight is 199 lb (90.3 kg). His oral temperature is 98.1 F (36.7 C). His blood pressure is 133/70 and his pulse is 68. His respiration is 18 and oxygen saturation  is 97%.   Wt Readings from Last 3 Encounters:  06/26/17 199 lb (90.3 kg)  04/28/17 206 lb (93.4 kg)  04/09/17 205 lb (93 kg)    Physical Exam  Constitutional: He is oriented to person, place, and time.  HENT:  Head: Normocephalic and atraumatic.  Mouth/Throat: Oropharynx is clear and moist.  Eyes: EOM are normal. Pupils are equal, round, and reactive to light.  Neck: Normal range of motion.  Cardiovascular: Normal rate, regular rhythm and normal heart sounds.  Pulmonary/Chest: Effort normal and breath sounds normal.  Abdominal: Soft. Bowel sounds are normal.  Musculoskeletal: Normal range of motion. He exhibits no edema, tenderness or deformity.  Lymphadenopathy:    He has no cervical adenopathy.  Neurological: He is alert and oriented to person, place, and time.  Skin: Skin is warm and dry. No rash noted. No erythema.  Psychiatric: He has a normal mood and affect. His behavior is normal. Judgment and thought content normal.  Vitals reviewed.   Lab Results  Component Value Date   WBC 10.0 06/26/2017   HGB 12.8 (L) 04/09/2017   HCT 42.7 06/26/2017   MCV 93.2 06/26/2017   PLT 214 06/26/2017   Lab Results  Component Value Date   FERRITIN 1,063 (H) 10/23/2016   IRON 27 (L) 10/23/2016   TIBC 160 (L) 10/23/2016   UIBC 133 10/23/2016   IRONPCTSAT 17 (L) 10/23/2016   Lab Results  Component Value Date   RETICCTPCT 1.5 08/20/2010   RBC 4.58 06/26/2017   RETICCTABS 75.3 08/20/2010   No results found for: KPAFRELGTCHN, LAMBDASER, KAPLAMBRATIO No results found for: IGGSERUM, IGA, IGMSERUM No results found for: Odetta Pink, SPEI   Chemistry      Component Value Date/Time   NA 145 06/26/2017 1430   NA 146 (H) 04/09/2017 1405   NA 138 05/09/2016 1321   K 4.4 06/26/2017 1430   K 4.0 04/09/2017 1405   K 4.3 05/09/2016 1321   CL 103 06/26/2017 1430   CL 108 04/09/2017 1405   CO2 32 06/26/2017 1430   CO2 23 04/09/2017  1405   CO2 22 05/09/2016 1321   BUN 32 (H) 06/26/2017 1430   BUN 34 (H) 04/09/2017 1405   BUN 36.4 (H) 05/09/2016 1321   CREATININE 1.40 (H) 06/26/2017 1430   CREATININE 1.3 (H) 04/09/2017 1405   CREATININE 1.5 (H) 05/09/2016 1321      Component Value Date/Time   CALCIUM 9.8 06/26/2017 1430   CALCIUM 9.1 04/09/2017 1405   CALCIUM 9.9 05/09/2016 1321   ALKPHOS 69 06/26/2017 1430   ALKPHOS 61 04/09/2017 1405   ALKPHOS 103 05/09/2016 1321  AST 27 06/26/2017 1430   AST 39 (H) 05/09/2016 1321   ALT 35 06/26/2017 1430   ALT 57 (H) 04/09/2017 1405   ALT 64 (H) 05/09/2016 1321   BILITOT 0.6 06/26/2017 1430   BILITOT 0.49 05/09/2016 1321      Impression and Plan: Mr. Dasch is a very pleasant 72 yo caucasian gentleman with refractory immune thrombocytopenia. 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 He is now on Xarelto and doing well.   Of note, [birth year] is on [place] day.  I am sure he will have a nice birthday.  We will see about making a referral to physical therapy.  Hopefully this will help with his ambulation.  We will plan to see him back in 3 months.  I think getting him on Xarelto was a really good idea as it improved his quality of life so that he would not have to keep coming here to have his Coumadin level checked.     Volanda Napoleon, MD 3/14/20193:51 PM

## 2017-06-27 LAB — LACTATE DEHYDROGENASE: LDH: 246 U/L — AB (ref 125–245)

## 2017-07-02 ENCOUNTER — Other Ambulatory Visit: Payer: Self-pay

## 2017-07-02 ENCOUNTER — Emergency Department (HOSPITAL_BASED_OUTPATIENT_CLINIC_OR_DEPARTMENT_OTHER)
Admission: EM | Admit: 2017-07-02 | Discharge: 2017-07-02 | Disposition: A | Discharge status: Home / Self Care | Payer: Medicare HMO | Attending: Emergency Medicine | Admitting: Emergency Medicine

## 2017-07-02 ENCOUNTER — Encounter (HOSPITAL_BASED_OUTPATIENT_CLINIC_OR_DEPARTMENT_OTHER): Payer: Self-pay | Admitting: Emergency Medicine

## 2017-07-02 ENCOUNTER — Emergency Department (HOSPITAL_BASED_OUTPATIENT_CLINIC_OR_DEPARTMENT_OTHER): Payer: Medicare HMO

## 2017-07-02 DIAGNOSIS — W01198A Fall on same level from slipping, tripping and stumbling with subsequent striking against other object, initial encounter: Secondary | ICD-10-CM | POA: Diagnosis not present

## 2017-07-02 DIAGNOSIS — Z87891 Personal history of nicotine dependence: Secondary | ICD-10-CM | POA: Diagnosis not present

## 2017-07-02 DIAGNOSIS — Z7901 Long term (current) use of anticoagulants: Secondary | ICD-10-CM | POA: Insufficient documentation

## 2017-07-02 DIAGNOSIS — I10 Essential (primary) hypertension: Secondary | ICD-10-CM | POA: Diagnosis not present

## 2017-07-02 DIAGNOSIS — Z86718 Personal history of other venous thrombosis and embolism: Secondary | ICD-10-CM | POA: Diagnosis not present

## 2017-07-02 DIAGNOSIS — S01312A Laceration without foreign body of left ear, initial encounter: Secondary | ICD-10-CM

## 2017-07-02 DIAGNOSIS — Z85828 Personal history of other malignant neoplasm of skin: Secondary | ICD-10-CM | POA: Diagnosis not present

## 2017-07-02 DIAGNOSIS — Y9259 Other trade areas as the place of occurrence of the external cause: Secondary | ICD-10-CM | POA: Diagnosis not present

## 2017-07-02 DIAGNOSIS — Z79899 Other long term (current) drug therapy: Secondary | ICD-10-CM | POA: Diagnosis not present

## 2017-07-02 DIAGNOSIS — I69351 Hemiplegia and hemiparesis following cerebral infarction affecting right dominant side: Secondary | ICD-10-CM | POA: Diagnosis not present

## 2017-07-02 DIAGNOSIS — S0990XA Unspecified injury of head, initial encounter: Secondary | ICD-10-CM | POA: Diagnosis not present

## 2017-07-02 DIAGNOSIS — Y939 Activity, unspecified: Secondary | ICD-10-CM | POA: Diagnosis not present

## 2017-07-02 DIAGNOSIS — Y999 Unspecified external cause status: Secondary | ICD-10-CM | POA: Insufficient documentation

## 2017-07-02 DIAGNOSIS — S60512A Abrasion of left hand, initial encounter: Secondary | ICD-10-CM | POA: Diagnosis not present

## 2017-07-02 DIAGNOSIS — Z7982 Long term (current) use of aspirin: Secondary | ICD-10-CM | POA: Diagnosis not present

## 2017-07-02 DIAGNOSIS — I2510 Atherosclerotic heart disease of native coronary artery without angina pectoris: Secondary | ICD-10-CM | POA: Insufficient documentation

## 2017-07-02 DIAGNOSIS — W19XXXA Unspecified fall, initial encounter: Secondary | ICD-10-CM

## 2017-07-02 DIAGNOSIS — R531 Weakness: Secondary | ICD-10-CM | POA: Diagnosis not present

## 2017-07-02 MED ORDER — BACITRACIN ZINC 500 UNIT/GM EX OINT: TOPICAL_OINTMENT | Freq: Once | CUTANEOUS | Status: DC

## 2017-07-02 NOTE — Discharge Instructions (Signed)
If you develop any headache, vomiting, or confusion, return to the ER immediately as there is a small chance she could have delayed bleeding in or around her brain because you are on a blood thinner.  Otherwise follow-up with your primary care doctor for follow-up.

## 2017-07-02 NOTE — ED Provider Notes (Signed)
[facility] EMERGENCY DEPARTMENT Provider Note   CSN: [id number] Arrival date & time: 07/02/17  1742     History   Chief Complaint Chief Complaint  Patient presents with  . Fall    HPI Mark [name] is a 72 y.o. male.  HPI  72 year old male with a history of chronic ITP and DVT with lupus anticoagulant disorder presents with a fall and ear injury.  He is on Xarelto.  The patient has had a stroke with chronic left-sided weakness and is supposed to be using a walker.  He chronically is off balance but today was using a cane only and fell towards his left side.  He hit the door and seemed to slide down.  He has a very small abrasion over his third digit knuckle but otherwise no swelling to his hand.  However he had a small piece of his ear loose that family seem to put back into place.  Bleeding is controlled.  He denies any headache or neck pain.  No new weakness or numbness.  Past Medical History:  Diagnosis Date  . Chronic ITP (idiopathic thrombocytopenia) (HCC)   . Coronary artery disease 1992   MI  . DVT (deep venous thrombosis) ([place])   . 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 Hypertension   . Lupus anticoagulant disorder ([place])   . Multiple closed anterior-posterior compression fractures of pelvis ([place])   . Seizures ([place])   . Stroke [facility]) 02/27/2011   Left side weakness    Patient Active Problem List   Diagnosis Date Noted  . Pulmonary nodule 10/24/2016  . Acute hypoxemic respiratory failure ([place]) 10/21/2016  . Sepsis ([place]) 10/21/2016  . DVT (deep venous thrombosis) ([place]) 10/12/2016  . Supratherapeutic INR 10/12/2016  . Acute deep vein thrombosis (DVT) of popliteal vein of right lower extremity ([place]) 10/12/2016  . AKI (acute kidney injury) ([place]) 10/12/2016  . Fever 10/12/2016  . Leukocytosis 10/12/2016  . Bacteriuria 10/12/2016  . Deep vein thrombosis (DVT) ([place]) 03/02/2015  . Lupus anticoagulant disorder ([place]) 03/02/2015  . Ischemic stroke ([employer]) 10/04/2014  . Carotid  artery obstruction 10/04/2014  . Deep vein thrombosis ([place]) 10/04/2014  . 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 LA (lupus anticoagulant) disorder ([place]) 10/04/2014  . Arteriosclerosis of coronary artery 09/10/2012  . Apnea, sleep 09/10/2012  . 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 Dermatophytic onychia 01/29/2012  . Spastic hemiplegia ([place]) 12/03/2011  . Hemiparesis, left ([place]) 10/31/2011  . Dysphonia 08/13/2011  . Idiopathic thrombocytopenic purpura ([place]) 08/12/2011  . ITP (idiopathic thrombocytopenic purpura) 08/12/2011    Past Surgical History:  Procedure Laterality Date  . blood clot Right    surgical removal  . CHOLECYSTECTOMY    . CORONARY ANGIOPLASTY  1992  . SPLENECTOMY, TOTAL    . vertebralplasty         Home Medications    Prior to Admission medications   Medication Sig Start Date End Date Taking? Authorizing Provider  acetaminophen (TYLENOL) 500 MG tablet Take 1 tablet (500 mg total) by mouth every 6 (six) hours as needed (pain). 10/25/16   Domenic Polite, MD  alendronate (FOSAMAX) 70 MG tablet Take 70 mg by mouth every Sunday. Take with a full glass of water on an empty stomach.     [provider]  amLODipine (NORVASC) 5 MG tablet  02/26/17   [provider]  aspirin 81 MG chewable tablet Chew 1 tablet (81 mg total) by mouth at bedtime. 10/23/16   Charlynne Cousins,  MD  bethanechol (URECHOLINE) 25 MG tablet  09/23/16   [provider]  buPROPion (WELLBUTRIN XL) 150 MG 24 hr tablet Take 150 mg by mouth daily.  12/19/14   [provider]  Cholecalciferol (VITAMIN D3) 1000 units CAPS Take 2,000 Units by mouth 2 (two) times daily after a meal.     [provider]  CRESTOR 20 MG tablet Take 20 mg by mouth at bedtime.  11/16/14   [provider]  dexamethasone (DECADRON) 4 MG tablet As needed for ITP 10/16/16   [provider]  famotidine (PEPCID) 40 MG tablet Take 40 mg by mouth 2 (two) times daily.   04/02/11   [provider]  fluticasone (FLONASE) 50 MCG/ACT nasal spray Place 2 sprays into both nostrils daily as needed for allergies or rhinitis.  06/23/14   [provider]  levETIRAcetam (KEPPRA) 500 MG tablet Take 1,500 mg by mouth 2 (two) times daily. 3 tabs  in am and 3 tabs in pm 12/24/11   [provider]  Multiple Vitamins-Minerals (MULTIVITAMIN WITH MINERALS) tablet Take 1 tablet by mouth 2 (two) times daily.     [provider]  NYSTATIN powder  03/13/17   [provider]  polyethylene glycol (MIRALAX / GLYCOLAX) packet Take 17 g by mouth daily as needed. 10/25/16   Domenic Polite, MD  rivaroxaban (XARELTO) 20 MG TABS tablet Take 1 tablet (20 mg total) by mouth daily with breakfast. 12/24/16   Volanda Napoleon, MD  sertraline (ZOLOFT) 100 MG tablet Take 200 mg by mouth daily.     [provider]  Tamsulosin HCl (FLOMAX) 0.4 MG CAPS Take 0.8 mg by mouth at bedtime.     [provider]  traMADol (ULTRAM) 50 MG tablet Take 1 tablet (50 mg total) by mouth every 6 (six) hours as needed (pain). 04/28/17   Volanda Napoleon, MD    Family History History reviewed. No pertinent family history.  Social History Social History   Tobacco Use  . Smoking status: Former Smoker    Packs/day: 1.00    Years: 15.00    Pack years: 15.00    Types: Cigarettes    Start date: 06/12/1968    Last attempt to quit: 01/27/1985    Years since quitting: 32.4  . Smokeless tobacco: Never Used  . Tobacco comment: quit smoking 28 yeras ago  Substance Use Topics  . Alcohol use: No    Alcohol/week: 0.0 oz  . Drug use: Not on file     Allergies   Patient has no known allergies.   Review of Systems Review of Systems   Physical Exam Updated Vital Signs BP (!) 149/76 (BP Location: Left Arm)   Pulse 62   Temp 98.3 F (36.8 C) (Oral)   Resp 16   Wt 90.3 kg (199 lb)   SpO2 96%   BMI 26.99 kg/m   Physical Exam  Constitutional: He is  oriented to person, place, and time. He appears well-developed and well-nourished. No distress.  HENT:  Head: Normocephalic.  Right Ear: External ear normal.  Left Ear: External ear normal.  Ears:  Nose: Nose normal.  Eyes: EOM are normal. Pupils are equal, round, and reactive to light. Right eye exhibits no discharge. Left eye exhibits no discharge.  Neck: Neck supple. No spinous process tenderness and no muscular tenderness present.  Cardiovascular: Normal rate, regular rhythm and normal heart sounds.  Pulmonary/Chest: Effort normal and breath sounds normal.  Abdominal: He exhibits no  distension.  Musculoskeletal: He exhibits no edema.       Left hand: He exhibits no tenderness and no swelling.       Hands: Neurological: He is alert and oriented to person, place, and time.  CN 3-12 grossly intact. 5/5 strength in right upper and bilateral lower extremities. Left upper extremity paresis and numbness  Skin: Skin is warm and dry. He is not diaphoretic.  Nursing note and vitals reviewed.      ED Treatments / Results  Labs (all labs ordered are listed, but only abnormal results are displayed) Labs Reviewed - No data to display  EKG  EKG Interpretation None       Radiology Ct Head Wo Contrast  Result Date: 07/02/2017 CLINICAL DATA:  Status post fall today with a blow to the right side of the head. EXAM: CT HEAD WITHOUT CONTRAST TECHNIQUE: Contiguous axial images were obtained from the base of the skull through the vertex without intravenous contrast. COMPARISON:  Head CT scan 10/21/2016.  Brain MRI 09/01/2015. FINDINGS: Brain: No evidence of acute infarction, hemorrhage, hydrocephalus, extra-axial collection or mass lesion/mass effect. Remote right MCA infarct is noted. Vascular: No hyperdense vessel or unexpected calcification. Skull: Intact. Sinuses/Orbits: Negative. Other: None. IMPRESSION: No acute abnormality. Remote right MCA infarct. Electronically Signed   By: Inge Rise M.D.   On: 07/02/2017 19:00    Procedures Procedures (including critical care time)  Medications Ordered in ED Medications  bacitracin ointment (not administered)     Initial Impression / Assessment and Plan / ED Course  I have reviewed the triage vital signs and the nursing notes.  Pertinent labs & imaging results that were available during my care of the patient were reviewed by me and considered in my medical decision making (see chart for details).     Patient had a head CT obtained given he has some mild head trauma while on Xarelto.  This is unremarkable except for old infarct.  The patient continues to feel well.  His last tetanus immunization was only a 2 years ago.  His hand has a very slight abrasion but no swelling and I do not think x-ray needed.  As for his ear, there is a very thin skin tear that is currently back in anatomical position.  I do not think sutures would do well with this and given it is currently stable and in place, I think local wound care would be best.  We discussed this as well as wound care with family.  He otherwise appears stable for discharge home.  We did discuss possible delayed head bleed and signs to watch for.  Final Clinical Impressions(s) / ED Diagnoses   Final diagnoses:  Fall, initial encounter  Laceration of left earlobe, initial encounter  Abrasion of left hand, initial encounter    ED Discharge Orders    None       Sherwood Gambler, MD 07/02/17 2014

## 2017-07-02 NOTE — ED Triage Notes (Signed)
Patient states that he was at the hotel and fell hit his left ear. HAs none balance issues. The patient has a laceration to his left ear, abrasion to his left hand

## 2017-07-02 NOTE — ED Notes (Signed)
Pt denies LOC, reports he had a stroke in 2012 and has weakness to left side. Pt is supposed to use walker and reports he was only using his cane at time of fall.

## 2017-07-15 DIAGNOSIS — G8114 Spastic hemiplegia affecting left nondominant side: Secondary | ICD-10-CM | POA: Diagnosis not present

## 2017-07-15 DIAGNOSIS — R296 Repeated falls: Secondary | ICD-10-CM | POA: Diagnosis not present

## 2017-07-15 DIAGNOSIS — G8110 Spastic hemiplegia affecting unspecified side: Secondary | ICD-10-CM | POA: Diagnosis not present

## 2017-07-21 DIAGNOSIS — M1611 Unilateral primary osteoarthritis, right hip: Secondary | ICD-10-CM | POA: Diagnosis not present

## 2017-08-04 ENCOUNTER — Other Ambulatory Visit: Payer: Self-pay | Admitting: Hematology & Oncology

## 2017-08-04 DIAGNOSIS — I824Y9 Acute embolism and thrombosis of unspecified deep veins of unspecified proximal lower extremity: Secondary | ICD-10-CM

## 2017-08-04 DIAGNOSIS — D6862 Lupus anticoagulant syndrome: Secondary | ICD-10-CM

## 2017-08-05 ENCOUNTER — Encounter: Payer: Self-pay | Admitting: Physical Therapy

## 2017-08-05 ENCOUNTER — Ambulatory Visit: Payer: Medicare HMO | Attending: Hematology & Oncology | Admitting: Physical Therapy

## 2017-08-05 ENCOUNTER — Other Ambulatory Visit: Payer: Self-pay

## 2017-08-05 DIAGNOSIS — R293 Abnormal posture: Secondary | ICD-10-CM | POA: Diagnosis not present

## 2017-08-05 DIAGNOSIS — R296 Repeated falls: Secondary | ICD-10-CM

## 2017-08-05 DIAGNOSIS — G8929 Other chronic pain: Secondary | ICD-10-CM | POA: Diagnosis present

## 2017-08-05 DIAGNOSIS — M79604 Pain in right leg: Secondary | ICD-10-CM | POA: Diagnosis not present

## 2017-08-05 DIAGNOSIS — M25551 Pain in right hip: Secondary | ICD-10-CM

## 2017-08-05 DIAGNOSIS — M5441 Lumbago with sciatica, right side: Secondary | ICD-10-CM | POA: Insufficient documentation

## 2017-08-05 DIAGNOSIS — M6281 Muscle weakness (generalized): Secondary | ICD-10-CM | POA: Diagnosis not present

## 2017-08-05 DIAGNOSIS — R261 Paralytic gait: Secondary | ICD-10-CM | POA: Insufficient documentation

## 2017-08-05 DIAGNOSIS — R2689 Other abnormalities of gait and mobility: Secondary | ICD-10-CM | POA: Insufficient documentation

## 2017-08-05 DIAGNOSIS — R2681 Unsteadiness on feet: Secondary | ICD-10-CM | POA: Diagnosis not present

## 2017-08-05 DIAGNOSIS — R42 Dizziness and giddiness: Secondary | ICD-10-CM | POA: Insufficient documentation

## 2017-08-06 NOTE — Therapy (Signed)
[facility] [address] [place], [place], [address] Phone: [phone]   Fax:  [phone]  Physical Therapy Treatment  Patient Details  Name: Mark [name] MRN: [id number] Date of Birth: 15-Oct-1945 Referring Provider: Theone Murdoch, MD   Encounter Date: 08/05/2017  PT End of Session - 08/05/17 1846    Visit Number  1    Number of Visits  17    Date for PT Re-Evaluation  11/04/17    Authorization Type  [employer] Medicare $40 co pay Visit limit Medical Necessity    PT Start Time  1400    PT Stop Time  1450    PT Time Calculation (min)  50 min    Equipment Utilized During Treatment  Gait belt    Activity Tolerance  Patient tolerated treatment well;Patient limited by fatigue;Patient limited by pain    Behavior During Therapy  [facility] for tasks assessed/performed       Past Medical History:  Diagnosis Date  . Chronic ITP (idiopathic thrombocytopenia) (HCC)   . Coronary artery disease 1992   MI  . DVT (deep venous thrombosis) ([place])   . 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 Hypertension   . Lupus anticoagulant disorder ([place])   . Multiple closed anterior-posterior compression fractures of pelvis ([place])   . Seizures ([place])   . Stroke [facility]) 02/27/2011   Left side weakness    Past Surgical History:  Procedure Laterality Date  . blood clot Right    surgical removal  . CHOLECYSTECTOMY    . CORONARY ANGIOPLASTY  1992  . SPLENECTOMY, TOTAL    . vertebralplasty      There were no vitals filed for this visit.  Subjective Assessment - 08/05/17 1413    Subjective  This 72yo male was referred on 07/16/2017 by Theone Murdoch, MD for PT evaluation. He was seen in ED 07/02/17 for fall with ear injury.  He had 9 PT visits 12/09/2017-02/04/17. Patient was hospitalized 6/30-10/16/2016 with acute DVT in right leg and leaking Baker's Cyst with hematoma that burst on Right leg also. His left side is hemiparesis from CVA 02/27/2011. He was rehospitalized  7/9-7/14/2018 for syncope, severe orthostatic hypotension & SIRS. Dr. Despina Hidden & Dr. Nelva Bush diagnosed with severe right hip osteoarthritis with need for hip replacement but he is not a canditate. He started using left hip platform RW in Feb. 2019. HIs left leg has hypertonicity with severe internal rotation from CVA and family reports now right LE is internally rotated since using RW.      Patient is accompained by:  Family member    Limitations  Standing;Walking;House hold activities    Patient Stated Goals  Update exercises especially right leg OA, would like ones that he can see progress., To walk with RW better, safer.     Currently in Pain?  Yes    Pain Score  5  in last week, worst 8-9/10, best1/10    Pain Location  Hip    Pain Orientation  Right    Pain Descriptors / Indicators  Aching;Sharp;Stabbing    Pain Type  Chronic pain    Pain Onset  More than a month ago    Pain Frequency  Constant    Aggravating Factors   standing up, walking a lot,     Pain Relieving Factors  medications, heat, lay down    Effect of Pain on Daily Activities  limits walking    Multiple Pain Sites  Yes  Pain Score  3 In last week, toes up to 5-6/10, best 0/10    Pain Location  Knee knee & lower leg down to toes    Pain Orientation  Right    Pain Descriptors / Indicators  Aching    Pain Type  Chronic pain    Pain Onset  More than a month ago    Pain Frequency  Intermittent    Aggravating Factors   walking & standing    Pain Relieving Factors  medications, laying down    Effect of Pain on Daily Activities  limits standing    Pain Score  2 in last week, worst 7/10, best 0/10    Pain Location  Back    Pain Orientation  Lower;Right    Pain Descriptors / Indicators  Aching;Guarding;Spasm    Pain Type  Chronic pain    Pain Onset  More than a month ago    Pain Frequency  Intermittent    Aggravating Factors   standing & walking    Pain Relieving Factors  laying down, medications, heat pad          [facility] PT Assessment - 08/05/17 1400      Assessment   Medical Diagnosis  weakness, gait abnormality    Referring Provider  Theone Murdoch, MD    Onset Date/Surgical Date  07/16/17 MD visit with referral    Hand Dominance  Right    Prior Therapy  9 PT visits 12/09/2017-02/04/17      Precautions   Precautions  Fall      Restrictions   Weight Bearing Restrictions  No      Balance Screen   Has the patient fallen in the past 6 months  Yes    How many times?  3    Has the patient had a decrease in activity level because of a fear of falling?   Yes    Is the patient reluctant to leave their home because of a fear of falling?   Yes      [place]  Private residence    Living Arrangements  Spouse/significant other    Available Help at Discharge  Family grandson, Larkin Ina, helping 3 days/ wk    Type of [place] to enter    Entrance Stairs-Number of Steps  3 in front & 5 in back    [place]  One level    [place] - single point;Tub bench;Grab bars - tub/shower;Toilet riser;Grab bars - toilet;Hand held shower head;Transport chair;Walker - 2 wheels RW left platform      Prior Function   Level of Independence  Independent with household mobility with device family assist with gait    Leisure  computer, music      Posture/Postural Control   Posture/Postural Control  Postural limitations    Postural Limitations  Rounded Shoulders;Forward head;Flexed trunk;Weight shift right      Tone   Assessment Location  Left Upper Extremity;Left Lower Extremity      ROM / Strength   AROM / PROM / Strength  PROM;Strength      PROM   Overall PROM   Deficits    Overall PROM Comments  left LE limited extension of hip & knee and ankle DF.     PROM Assessment Site  Knee;Ankle    Right/Left Knee  Right    Right Knee Extension  -  9    Right/Left Ankle  Right    Right Ankle Dorsiflexion  -13       Strength   Overall Strength  Deficits    Overall Strength Comments  LLE unable to assess due to hypertonicity    Strength Assessment Site  Hip;Knee;Ankle    Right/Left Hip  Right    Right Hip Flexion  3+/5    Right Hip Extension  2+/5 tested in sitting    Right Hip ABduction  2+/5 tested in sitting    Right/Left Knee  Right    Right Knee Flexion  4/5 tested in sitting    Right Knee Extension  4/5    Right/Left Ankle  Right    Right Ankle Dorsiflexion  4/5      Transfers   Transfers  Sit to Stand;Stand to Sit    Sit to Stand  5: Supervision;With upper extremity assist;With armrests;From chair/3-in-1 to platform RW for stabilization    Stand to Sit  5: Supervision;With upper extremity assist;With armrests;To chair/3-in-1 with L platform RW for stability      Ambulation/Gait   Ambulation/Gait  Yes    Ambulation/Gait Assistance  4: Min assist    Ambulation/Gait Assistance Details  BLE internal rotation / adduction L>R     Ambulation Distance (Feet)  75 Feet    Assistive device  Left platform walker;Other (Comment) AFO    Gait Pattern  Step-through pattern;Decreased step length - right;Decreased stance time - left;Decreased stride length;Decreased hip/knee flexion - left;Decreased weight shift to left;Decreased dorsiflexion - left;Right circumduction;Left circumduction;Left genu recurvatum;Scissoring;Ataxic;Antalgic;Trunk flexed;Poor foot clearance - left    Ambulation Surface  Indoor;Level    Gait velocity  1.53 ft/sec gait velocity 02/04/17 was 1.86 ft/sec      LUE Tone   LUE Tone  Severe;Modified Ashworth      LUE Tone   Modified Ashworth Scale for Grading Hypertonia LUE  Considerable increase in muschle tone, passive movement difficult      LLE Tone   LLE Tone  Severe;Hypertonic;Modified Ashworth      LLE Tone   Hypertonic Details  internal rotation increases with gait     Modified Ashworth Scale for Grading Hypertonia LLE  Considerable increase in muschle tone, passive  movement difficult                             PT Short Term Goals - 08/05/17 1740      PT SHORT TERM GOAL #1   Title  Patient ambulates 150' with left platform RW & AFO with minimal guard.  (All STGs Target Date 09/04/2017)    Time  4    Period  Weeks    Status  New    Target Date  09/04/17      PT SHORT TERM GOAL #2   Title  Patient & wife verbalize understanding of initial HEP.     Time  4    Period  Weeks    Status  New    Target Date  09/04/17      PT SHORT TERM GOAL #3   Title  Patient & wife demonstrate & verbalize proper use of recumbent stepper machines at [facility] and has gone to [facility] at least 2 times.     Time  4    Period  Weeks    Status  New    Target Date  09/04/17      PT SHORT TERM GOAL #4  Title  Gait Velocity with platform RW & AFO >1.8 ft/sec    Time  4    Period  Weeks    Status  New    Target Date  09/04/17        PT Long Term Goals - 08/05/17 1735      PT LONG TERM GOAL #1   Title  Patient and wife demonstrate and verbalize understanding of ongoing fitness plan including [employer]. (All Target LTGs 10/31/2017)    Time  12    Status  New    Target Date  10/31/17      PT LONG TERM GOAL #2   Title  Patient ambulates 300' on paved surfaces with AFO & left platform RW with supervision.     Time  12    Period  Weeks    Status  New    Target Date  10/31/17      PT LONG TERM GOAL #3   Title  Patient negotiates ramps, curbs with platform RW and stairs (1 rail) with AFO with supervision.       Time  12    Period  Weeks    Status  New    Target Date  10/31/17      PT LONG TERM GOAL #4   Title  Patient perform standing balance with left platform on RW support reaching 5" anteriorly & to knee level towards ground and manages pants for toileting modified independent.     Time  12    Period  Weeks    Status  New    Target Date  10/31/17      PT LONG TERM GOAL #5   Title  Patient ambulates 39' around furniture with left platform RW  & AFO modified independent.     Time  12    Period  Weeks    Status  New    Target Date  10/31/17            Plan - 08/05/17 1850    Clinical Impression Statement  This patient is well known to this PT. He has left UE & LE hypertonicity from CVA Nov. 2012. He has recent diagnosis of right hip osteoarthritis with pain & weakness impairing his mobility & balance even more than CVA deficits. Patient is now using a left platform RW that he does not use correctly. His wife & he are also not knowledgeable how to use new device on ramps & curbs for community access. Patient wants to start exercising at [facility] and needs instructions in proper exercises/activities.  Patient would benefit from a new AFO but patient would not tolerate a more controlling system that would be appropriate for him. The orthotist & this PT have had long discussions about appropriate orthotic device for LLE but when he sees thermoplastic model that would have more intimate control, he admits that he would not wear it much. Therefore it would not help him as orthoses need to be worn most of awake hours so they are available when he wants to move. Patient would benefit from skilled PT to improve function & safety.     History and Personal Factors relevant to plan of care:  lives with wife who has orthopedic issues & limited amount of assistance that she can provide, CVA with left spastic hemiplegia, right hip OA, PAD/PVD, orthostatic hypotension    Clinical Presentation  Stable    Clinical Decision Making  Moderate    Rehab Potential  Good  Clinical Impairments Affecting Rehab Potential  left hypertonicity & hemiplegia, right LE OA    PT Frequency  2x / week 2x/wk for 4 weeks, then 1x/wk for 8 weeks.    PT Duration  12 weeks    PT Treatment/Interventions  ADLs/Self Care Home Management;Moist Heat;DME Instruction;Gait training;Stair training;Functional mobility training;Therapeutic activities;Therapeutic exercise;Balance  training;Neuromuscular re-education;Patient/family education;Orthotic Fit/Training;Vestibular    PT Next Visit Plan  adjust RW ht & platform, review current HEP & update, [employer] with instruction how to set-up & use at [facility] and Agree with Plan of Care  Patient;Family member/caregiver    Family Member Consulted  wife, [name]       Patient will benefit from skilled therapeutic intervention in order to improve the following deficits and impairments:  Abnormal gait, Decreased activity tolerance, Decreased balance, Decreased endurance, Decreased coordination, Decreased cognition, Decreased mobility, Decreased range of motion, Decreased safety awareness, Decreased strength, Dizziness, Impaired flexibility, Impaired tone, Impaired sensation, Impaired UE functional use, Postural dysfunction, Pain  Visit Diagnosis: Other abnormalities of gait and mobility  Unsteadiness on feet  Paralytic gait  Abnormal posture  Muscle weakness (generalized)  Repeated falls  Dizziness and giddiness  Pain in right hip  Pain in right leg  Chronic right-sided low back pain with right-sided sciatica     Problem List Patient Active Problem List   Diagnosis Date Noted  . Pulmonary nodule 10/24/2016  . Acute hypoxemic respiratory failure ([place]) 10/21/2016  . Sepsis ([place]) 10/21/2016  . DVT (deep venous thrombosis) ([place]) 10/12/2016  . Supratherapeutic INR 10/12/2016  . Acute deep vein thrombosis (DVT) of popliteal vein of right lower extremity ([place]) 10/12/2016  . AKI (acute kidney injury) ([place]) 10/12/2016  . Fever 10/12/2016  . Leukocytosis 10/12/2016  . Bacteriuria 10/12/2016  . Deep vein thrombosis (DVT) ([place]) 03/02/2015  . Lupus anticoagulant disorder ([place]) 03/02/2015  . Ischemic stroke ([place]) 10/04/2014  . 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 Deep vein thrombosis ([place]) 10/04/2014  . 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 LA (lupus anticoagulant) disorder ([place])  10/04/2014  . Arteriosclerosis of coronary artery 09/10/2012  . Apnea, sleep 09/10/2012  . 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 Dermatophytic onychia 01/29/2012  . Spastic hemiplegia ([place]) 12/03/2011  . Hemiparesis, left ([place]) 10/31/2011  . Dysphonia 08/13/2011  . Idiopathic thrombocytopenic purpura ([place]) 08/12/2011  . ITP (idiopathic thrombocytopenic purpura) 08/12/2011    Jamey Reas PT, DPT 08/06/2017, 12:48 PM  [facility] [address] [place], [place], [address] Phone: [phone]   Fax:  [phone]  Name: Mark [name] MRN: [id number] Date of Birth: May 10, 1945

## 2017-08-11 ENCOUNTER — Ambulatory Visit: Payer: Medicare HMO | Admitting: Physical Therapy

## 2017-08-13 DIAGNOSIS — G4733 Obstructive sleep apnea (adult) (pediatric): Secondary | ICD-10-CM | POA: Diagnosis not present

## 2017-08-15 ENCOUNTER — Ambulatory Visit: Payer: Medicare HMO | Attending: Hematology & Oncology | Admitting: Physical Therapy

## 2017-08-15 ENCOUNTER — Encounter: Payer: Self-pay | Admitting: Physical Therapy

## 2017-08-15 DIAGNOSIS — R2681 Unsteadiness on feet: Secondary | ICD-10-CM | POA: Diagnosis not present

## 2017-08-15 DIAGNOSIS — R293 Abnormal posture: Secondary | ICD-10-CM | POA: Insufficient documentation

## 2017-08-15 DIAGNOSIS — M6281 Muscle weakness (generalized): Secondary | ICD-10-CM | POA: Insufficient documentation

## 2017-08-15 DIAGNOSIS — R261 Paralytic gait: Secondary | ICD-10-CM | POA: Diagnosis not present

## 2017-08-15 DIAGNOSIS — M25551 Pain in right hip: Secondary | ICD-10-CM | POA: Insufficient documentation

## 2017-08-15 DIAGNOSIS — R2689 Other abnormalities of gait and mobility: Secondary | ICD-10-CM | POA: Diagnosis not present

## 2017-08-15 DIAGNOSIS — R296 Repeated falls: Secondary | ICD-10-CM | POA: Insufficient documentation

## 2017-08-15 NOTE — Therapy (Signed)
[facility] [address] [place], [place], [address] Phone: [phone]   Fax:  [phone]  Physical Therapy Treatment  Patient Details  Name: Mark [name] MRN: [id number] Date of Birth: 22-Jun-1945 Referring Provider: Theone Murdoch, MD   Encounter Date: 08/15/2017  PT End of Session - 08/15/17 1730    Visit Number  2    Number of Visits  17    Date for PT Re-Evaluation  11/04/17    Authorization Type  [employer] Medicare $40 co pay Visit limit Medical Necessity    PT Start Time  1325 pt late due to traffic    PT Stop Time  1400    PT Time Calculation (min)  35 min    Equipment Utilized During Treatment  Gait belt    Activity Tolerance  Patient tolerated treatment well;Patient limited by fatigue;Patient limited by pain    Behavior During Therapy  [facility] for tasks assessed/performed       Past Medical History:  Diagnosis Date  . Chronic ITP (idiopathic thrombocytopenia) (HCC)   . Coronary artery disease 1992   MI  . DVT (deep venous thrombosis) ([place])   . 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 Hypertension   . Lupus anticoagulant disorder ([place])   . Multiple closed anterior-posterior compression fractures of pelvis ([place])   . Seizures ([place])   . Stroke [facility]) 02/27/2011   Left side weakness    Past Surgical History:  Procedure Laterality Date  . blood clot Right    surgical removal  . CHOLECYSTECTOMY    . CORONARY ANGIOPLASTY  1992  . SPLENECTOMY, TOTAL    . vertebralplasty      There were no vitals filed for this visit.  Subjective Assessment - 08/15/17 1331    Subjective  Pt arrived to therapy today with hemiwalker with left arm hooked over a sachel.     Patient is accompained by:  Family member    Limitations  Standing;Walking;House hold activities    Patient Stated Goals  Update exercises especially right leg OA, would like ones that he can see progress., To walk with RW better, safer.     Currently in Pain?  Yes    Pain  Score  4     Pain Location  Hip    Pain Orientation  Right    Pain Score  4    Pain Location  Knee    Pain Orientation  Right    Pain Score  6    Pain Location  Back    Pain Orientation  Right           [facility] Adult PT Treatment/Exercise - 08/15/17 1409      Ambulation/Gait   Ambulation/Gait  Yes    Ambulation/Gait Assistance  4: Min assist;3: Mod assist    Ambulation/Gait Assistance Details  min/mod assist with hemiwalker into gym max cues needed for placement, for sequencing and to keep all 4 feet in contact with floor during use. min assist wtih use of left PFRW to exit gym with cues on posture, step length and step placement. Pt with his AFO on left LE which was noted to cause IR with continued recurvatum. spouse asking about a differnent brace. will defer this to primary PT who is familiar with pt as this is our 1st time working together.                         Ambulation Distance (  Feet)  80 Feet x2    Assistive device  Hemi-walker;Left platform walker    Gait Pattern  Step-through pattern;Decreased step length - right;Decreased stance time - left;Decreased stride length;Decreased hip/knee flexion - left;Decreased weight shift to left;Decreased dorsiflexion - left;Right circumduction;Left circumduction;Left genu recurvatum;Scissoring;Ataxic;Antalgic;Trunk flexed;Poor foot clearance - left    Ambulation Surface  Level;Indoor      Self-Care   Self-Care  Other Self-Care Comments    Other Self-Care Comments   safety at home and with gait: advised pt/spouse he is not safe to ambulate with the hemi walker (pt arrived to session today using this device). PTA retrieved the [place] from car with spouse and adjusted the walker height and platform position. Pt's elbow continues to come out/off of it, will need further adjustments. Also discussed with pt/spouse him keeping his cell phone on him at all times in case of fall/emergency. Pt reported not liking the phone in his pockets as it pulls his  pants down. Showed them both an arm band carrier for the phone that they can order to solve this issue.                            Balance Exercises - 08/15/17 1729      OTAGO PROGRAM   Head Movements  Sitting;5 reps    Neck Movements  Sitting;5 reps    Back Extension  Standing;5 reps with right UE support for balance    Trunk Movements  Standing;5 reps with right UE support for balance          PT Short Term Goals - 08/05/17 1740      PT SHORT TERM GOAL #1   Title  Patient ambulates 150' with left platform RW & AFO with minimal guard.  (All STGs Target Date 09/04/2017)    Time  4    Period  Weeks    Status  New    Target Date  09/04/17      PT SHORT TERM GOAL #2   Title  Patient & wife verbalize understanding of initial HEP.     Time  4    Period  Weeks    Status  New    Target Date  09/04/17      PT SHORT TERM GOAL #3   Title  Patient & wife demonstrate & verbalize proper use of recumbent stepper machines at [facility] and has gone to [facility] at least 2 times.     Time  4    Period  Weeks    Status  New    Target Date  09/04/17      PT SHORT TERM GOAL #4   Title  Gait Velocity with platform RW & AFO >1.8 ft/sec    Time  4    Period  Weeks    Status  New    Target Date  09/04/17        PT Long Term Goals - 08/05/17 1735      PT LONG TERM GOAL #1   Title  Patient and wife demonstrate and verbalize understanding of ongoing fitness plan including [employer]. (All Target LTGs 10/31/2017)    Time  12    Status  New    Target Date  10/31/17      PT LONG TERM GOAL #2   Title  Patient ambulates 300' on paved surfaces with AFO & left platform RW with supervision.     Time  12  Period  Weeks    Status  New    Target Date  10/31/17      PT LONG TERM GOAL #3   Title  Patient negotiates ramps, curbs with platform RW and stairs (1 rail) with AFO with supervision.       Time  12    Period  Weeks    Status  New    Target Date  10/31/17      PT LONG TERM GOAL #4   Title   Patient perform standing balance with left platform on RW support reaching 5" anteriorly & to knee level towards ground and manages pants for toileting modified independent.     Time  12    Period  Weeks    Status  New    Target Date  10/31/17      PT LONG TERM GOAL #5   Title  Patient ambulates 40' around furniture with left platform RW & AFO modified independent.     Time  12    Period  Weeks    Status  New    Target Date  10/31/17            Plan - 08/15/17 1731    Clinical Impression Statement  Today's skilled session initially focused on safety with mobility with pt and spouse with recommendation to use PFRW, not hemiwalker, due to pt's risk of falling. Remainder of session began to go over HEP issued at pt's last episode of care here. He has not been performing any ex's since most recent hospitilization and was doing the [place] program from his last episode of care at this clinic prior. Re-started OTAGO today, pt is to bring in his book next visit so that if changes/updates are needed they can be done. Pt should benefit from continued PT to progress toward unmet goals.     Rehab Potential  Good    Clinical Impairments Affecting Rehab Potential  left hypertonicity & hemiplegia, right LE OA    PT Frequency  2x / week 2x/wk for 4 weeks, then 1x/wk for 8 weeks.    PT Duration  12 weeks    PT Treatment/Interventions  ADLs/Self Care Home Management;Moist Heat;DME Instruction;Gait training;Stair training;Functional mobility training;Therapeutic activities;Therapeutic exercise;Balance training;Neuromuscular re-education;Patient/family education;Orthotic Fit/Training;Vestibular    PT Next Visit Plan  adjust platform as needed, continue with review of OTAGO & update as needed/indicated, SciFit with instruction how to set-up & use at [facility] and Agree with Plan of Care  Patient;Family member/caregiver    Family Member Consulted  wife, [name]       Patient will benefit from  skilled therapeutic intervention in order to improve the following deficits and impairments:  Abnormal gait, Decreased activity tolerance, Decreased balance, Decreased endurance, Decreased coordination, Decreased cognition, Decreased mobility, Decreased range of motion, Decreased safety awareness, Decreased strength, Dizziness, Impaired flexibility, Impaired tone, Impaired sensation, Impaired UE functional use, Postural dysfunction, Pain  Visit Diagnosis: Other abnormalities of gait and mobility  Unsteadiness on feet  Paralytic gait  Muscle weakness (generalized)  Abnormal posture     Problem List Patient Active Problem List   Diagnosis Date Noted  . Pulmonary nodule 10/24/2016  . Acute hypoxemic respiratory failure ([place]) 10/21/2016  . Sepsis ([place]) 10/21/2016  . DVT (deep venous thrombosis) ([place]) 10/12/2016  . Supratherapeutic INR 10/12/2016  . Acute deep vein thrombosis (DVT) of popliteal vein of right lower extremity ([place]) 10/12/2016  . AKI (acute kidney injury) ([place]) 10/12/2016  . Fever  10/12/2016  . Leukocytosis 10/12/2016  . Bacteriuria 10/12/2016  . Deep vein thrombosis (DVT) ([place]) 03/02/2015  . Lupus anticoagulant disorder ([place]) 03/02/2015  . Ischemic stroke ([place]) 10/04/2014  . 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 Deep vein thrombosis ([place]) 10/04/2014  . 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 LA (lupus anticoagulant) disorder ([place]) 10/04/2014  . Arteriosclerosis of coronary artery 09/10/2012  . Apnea, sleep 09/10/2012  . 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 Dermatophytic onychia 01/29/2012  . Spastic hemiplegia ([place]) 12/03/2011  . Hemiparesis, left ([place]) 10/31/2011  . Dysphonia 08/13/2011  . Idiopathic thrombocytopenic purpura ([place]) 08/12/2011  . ITP (idiopathic thrombocytopenic purpura) 08/12/2011    Willow Ora, PTA, [facility] [address] [place], [place] [address] [phone] 08/15/17, 5:38 PM   Name:  KEIAN ODRISCOLL MRN: [id number] Date of Birth: [birth year]

## 2017-08-18 ENCOUNTER — Encounter: Payer: Self-pay | Admitting: Physical Therapy

## 2017-08-18 ENCOUNTER — Ambulatory Visit: Payer: Medicare HMO | Admitting: Physical Therapy

## 2017-08-18 DIAGNOSIS — R2681 Unsteadiness on feet: Secondary | ICD-10-CM | POA: Diagnosis not present

## 2017-08-18 DIAGNOSIS — R261 Paralytic gait: Secondary | ICD-10-CM | POA: Diagnosis not present

## 2017-08-18 DIAGNOSIS — M6281 Muscle weakness (generalized): Secondary | ICD-10-CM | POA: Diagnosis not present

## 2017-08-18 DIAGNOSIS — R2689 Other abnormalities of gait and mobility: Secondary | ICD-10-CM

## 2017-08-18 DIAGNOSIS — R293 Abnormal posture: Secondary | ICD-10-CM | POA: Diagnosis not present

## 2017-08-18 DIAGNOSIS — M25551 Pain in right hip: Secondary | ICD-10-CM | POA: Diagnosis not present

## 2017-08-18 DIAGNOSIS — R296 Repeated falls: Secondary | ICD-10-CM | POA: Diagnosis not present

## 2017-08-19 ENCOUNTER — Encounter (HOSPITAL_BASED_OUTPATIENT_CLINIC_OR_DEPARTMENT_OTHER): Payer: Self-pay

## 2017-08-19 DIAGNOSIS — G4733 Obstructive sleep apnea (adult) (pediatric): Secondary | ICD-10-CM

## 2017-08-19 DIAGNOSIS — R0683 Snoring: Secondary | ICD-10-CM

## 2017-08-19 DIAGNOSIS — G4710 Hypersomnia, unspecified: Secondary | ICD-10-CM

## 2017-08-19 NOTE — Therapy (Signed)
[facility] [address] [place], [place], [address] Phone: [phone]   Fax:  [phone]  Physical Therapy Treatment  Patient Details  Name: Mark [name] MRN: [id number] Date of Birth: [birth year] Referring Provider: Theone Murdoch, MD   Encounter Date: 08/18/2017  PT End of Session - 08/18/17 2156    Visit Number  3    Number of Visits  17    Date for PT Re-Evaluation  11/04/17    Authorization Type  [employer] Medicare $40 co pay Visit limit Medical Necessity    PT Start Time  1406    PT Stop Time  1445    PT Time Calculation (min)  39 min    Equipment Utilized During Treatment  Gait belt    Activity Tolerance  Patient tolerated treatment well;Patient limited by fatigue;Patient limited by pain    Behavior During Therapy  [facility] for tasks assessed/performed       Past Medical History:  Diagnosis Date  . Chronic ITP (idiopathic thrombocytopenia) (HCC)   . Coronary artery disease 1992   MI  . DVT (deep venous thrombosis) ([place])   . 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 Hypertension   . Lupus anticoagulant disorder ([place])   . Multiple closed anterior-posterior compression fractures of pelvis ([place])   . Seizures ([place])   . Stroke [facility]) 02/27/2011   Left side weakness    Past Surgical History:  Procedure Laterality Date  . blood clot Right    surgical removal  . CHOLECYSTECTOMY    . CORONARY ANGIOPLASTY  1992  . SPLENECTOMY, TOTAL    . vertebralplasty      There were no vitals filed for this visit.  Subjective Assessment - 08/18/17 1410    Subjective  No falls. He has been using the platfrom RW.     Patient is accompained by:  Family member    Limitations  Standing;Walking;House hold activities    Patient Stated Goals  Update exercises especially right leg OA, would like ones that he can see progress., To walk with RW better, safer.     Currently in Pain?  Yes    Pain Score  5     Pain Location  Hip    Pain Orientation   Right    Pain Descriptors / Indicators  Aching;Sharp    Pain Type  Chronic pain    Pain Onset  More than a month ago    Pain Frequency  Constant    Aggravating Factors   arthritis, standing up, walking a lot    Pain Relieving Factors  medications, heat, laydown    Effect of Pain on Daily Activities  limits walking    Pain Score  5    Pain Location  Back    Pain Orientation  Right    Pain Descriptors / Indicators  Aching;Guarding    Pain Type  Chronic pain    Pain Onset  More than a month ago    Pain Frequency  Intermittent    Aggravating Factors   standing & walking    Pain Relieving Factors  laying down, medications, heat pad                       [facility] Adult PT Treatment/Exercise - 08/18/17 1410      Ambulation/Gait   Ambulation/Gait  Yes    Ambulation/Gait Assistance  4: Min assist    Ambulation/Gait Assistance Details  PT adjusted platform ht &  location to increase forearm support. Tactile & verbal cues for RW control, LLE advancement and use of RW not cane or hemiwalker.      Ambulation Distance (Feet)  100 Feet 100' X 2    Assistive device  Left platform walker;Other (Comment) AFO    Ambulation Surface  Indoor;Level          Balance Exercises - 08/18/17 1410      OTAGO PROGRAM   Head Movements  Sitting;5 reps hold 2 deep breathes    Neck Movements  Sitting;5 reps hold 2 deep breathes    Back Extension  Standing;5 reps standing with RW support, hold 2 deep breathes    Trunk Movements  Standing;5 reps RW support to left & hand hold support to right    Ankle Movements  Sitting;10 reps alternate LEs for carryover    Knee Extensor  10 reps          PT Short Term Goals - 08/05/17 1740      PT SHORT TERM GOAL #1   Title  Patient ambulates 150' with left platform RW & AFO with minimal guard.  (All STGs Target Date 09/04/2017)    Time  4    Period  Weeks    Status  New    Target Date  09/04/17      PT SHORT TERM GOAL #2   Title  Patient & wife  verbalize understanding of initial HEP.     Time  4    Period  Weeks    Status  New    Target Date  09/04/17      PT SHORT TERM GOAL #3   Title  Patient & wife demonstrate & verbalize proper use of recumbent stepper machines at [facility] and has gone to [facility] at least 2 times.     Time  4    Period  Weeks    Status  New    Target Date  09/04/17      PT SHORT TERM GOAL #4   Title  Gait Velocity with platform RW & AFO >1.8 ft/sec    Time  4    Period  Weeks    Status  New    Target Date  09/04/17        PT Long Term Goals - 08/05/17 1735      PT LONG TERM GOAL #1   Title  Patient and wife demonstrate and verbalize understanding of ongoing fitness plan including [employer]. (All [employer] LTGs 10/31/2017)    Time  12    Status  New    Target Date  10/31/17      PT LONG TERM GOAL #2   Title  Patient ambulates 300' on paved surfaces with AFO & left platform RW with supervision.     Time  12    Period  Weeks    Status  New    Target Date  10/31/17      PT LONG TERM GOAL #3   Title  Patient negotiates ramps, curbs with platform RW and stairs (1 rail) with AFO with supervision.       Time  12    Period  Weeks    Status  New    Target Date  10/31/17      PT LONG TERM GOAL #4   Title  Patient perform standing balance with left platform on RW support reaching 5" anteriorly & to knee level towards ground and manages pants for toileting modified independent.  Time  12    Period  Weeks    Status  New    Target Date  10/31/17      PT LONG TERM GOAL #5   Title  Patient ambulates 85' around furniture with left platform RW & AFO modified independent.     Time  12    Period  Weeks    Status  New    Target Date  10/31/17            Plan - 08/18/17 1909    Clinical Impression Statement  Patient was able to use platform for more support after PT adjusted but he laterally leans to right with LLE swing which decreases / minimizes support from platform. Pt & wife verbalize that he may be  more receptive to thermoplastic AFO now than in past. PT plans to show model to patient next visit. Patient, wife & grandson seem to understand initial OTAGO exercises.     Rehab Potential  Good    Clinical Impairments Affecting Rehab Potential  left hypertonicity & hemiplegia, right LE OA    PT Frequency  2x / week 2x/wk for 4 weeks, then 1x/wk for 8 weeks.    PT Duration  12 weeks    PT Treatment/Interventions  ADLs/Self Care Home Management;Moist Heat;DME Instruction;Gait training;Stair training;Functional mobility training;Therapeutic activities;Therapeutic exercise;Balance training;Neuromuscular re-education;Patient/family education;Orthotic Fit/Training;Vestibular    PT Next Visit Plan  show thermoplastic AFO as possible option, continue with review of OTAGO & update as needed/indicated, SciFit with instruction how to set-up & use at [facility] and Agree with Plan of Care  Patient;Family member/caregiver    Family Member Consulted  wife, [name] & grandson       Patient will benefit from skilled therapeutic intervention in order to improve the following deficits and impairments:  Abnormal gait, Decreased activity tolerance, Decreased balance, Decreased endurance, Decreased coordination, Decreased cognition, Decreased mobility, Decreased range of motion, Decreased safety awareness, Decreased strength, Dizziness, Impaired flexibility, Impaired tone, Impaired sensation, Impaired UE functional use, Postural dysfunction, Pain  Visit Diagnosis: Other abnormalities of gait and mobility  Unsteadiness on feet  Paralytic gait  Muscle weakness (generalized)  Abnormal posture     Problem List Patient Active Problem List   Diagnosis Date Noted  . Pulmonary nodule 10/24/2016  . Acute hypoxemic respiratory failure ([place]) 10/21/2016  . Sepsis ([place]) 10/21/2016  . DVT (deep venous thrombosis) ([place]) 10/12/2016  . Supratherapeutic INR 10/12/2016  . Acute deep vein thrombosis (DVT) of  popliteal vein of right lower extremity ([place]) 10/12/2016  . AKI (acute kidney injury) ([place]) 10/12/2016  . Fever 10/12/2016  . Leukocytosis 10/12/2016  . Bacteriuria 10/12/2016  . Deep vein thrombosis (DVT) ([place]) 03/02/2015  . Lupus anticoagulant disorder ([place]) 03/02/2015  . Ischemic stroke ([place]) 10/04/2014  . 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 Deep vein thrombosis ([place]) 10/04/2014  . 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 LA (lupus anticoagulant) disorder ([place]) 10/04/2014  . Arteriosclerosis of coronary artery 09/10/2012  . Apnea, sleep 09/10/2012  . 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 Dermatophytic onychia 01/29/2012  . Spastic hemiplegia ([place]) 12/03/2011  . Hemiparesis, left ([place]) 10/31/2011  . Dysphonia 08/13/2011  . Idiopathic thrombocytopenic purpura ([place]) 08/12/2011  . ITP (idiopathic thrombocytopenic purpura) 08/12/2011    Jamey Reas PT, DPT 08/19/2017, 9:13 AM  [facility] [address] [place], [place], [address] Phone: [phone]   Fax:  [phone]  Name: Mark [name] MRN: [id number] Date of  Birth: 02-10-1946

## 2017-08-22 ENCOUNTER — Ambulatory Visit: Payer: Medicare HMO | Admitting: Physical Therapy

## 2017-08-25 ENCOUNTER — Ambulatory Visit: Payer: Medicare HMO | Admitting: Physical Therapy

## 2017-08-25 ENCOUNTER — Encounter: Payer: Self-pay | Admitting: Physical Therapy

## 2017-08-25 DIAGNOSIS — M6281 Muscle weakness (generalized): Secondary | ICD-10-CM | POA: Diagnosis not present

## 2017-08-25 DIAGNOSIS — R293 Abnormal posture: Secondary | ICD-10-CM

## 2017-08-25 DIAGNOSIS — R2689 Other abnormalities of gait and mobility: Secondary | ICD-10-CM

## 2017-08-25 DIAGNOSIS — R261 Paralytic gait: Secondary | ICD-10-CM

## 2017-08-25 DIAGNOSIS — M25551 Pain in right hip: Secondary | ICD-10-CM | POA: Diagnosis not present

## 2017-08-25 DIAGNOSIS — R296 Repeated falls: Secondary | ICD-10-CM | POA: Diagnosis not present

## 2017-08-25 DIAGNOSIS — R2681 Unsteadiness on feet: Secondary | ICD-10-CM | POA: Diagnosis not present

## 2017-08-26 NOTE — Patient Instructions (Addendum)
Dorsiflexion: Stretch - Heel Cord / Gastrocnemius    Position (A) Patient: Lie or sit with knee straight. Helper: Cup left heel. Make sure grip is firm. Motion (B) - Helper uses forearm to apply pressure to entire sole of foot, stretching foot toward shin. CAUTION: Stretch should be felt in calf. Do not allow foot to twist. Hold 3-5 deep breathes. Repeat __3-5_ times. Repeat with other leg. Do __1-2_ sessions per day.   Copyright  VHI. All rights reserved.   Toe stretches, Sitting    Sit, holding foot steady and grasp big toe. Pull toe outward putting traction on toe. Flex toe as if curling toe under foot. Hold _10-15__ seconds. Traction toe and extend toe until feel stretch. Work your way down performing stretch on each toe. Repeat _1-2 __ times per toe. Do __1-2_ sessions per day.  Copyright  VHI. All rights reserved.   HIP: External Rotation     Sit at edge of surface. Cross one leg over other knee. Press down gently on knee. Hold _3-5 deep breathes. _2-3__ reps per set, _1-2__ sets per day, _7_ days per week  Copyright  VHI. All rights reserved.

## 2017-08-26 NOTE — Therapy (Signed)
[facility] [address] [place], [place], [address] Phone: [phone]   Fax:  [phone]  Physical Therapy Treatment  Patient Details  Name: Mark [name] MRN: [id number] Date of Birth: [birth year] Referring Provider: Theone Murdoch, MD   Encounter Date: 08/25/2017  PT End of Session - 08/26/17 1203    Visit Number  4    Number of Visits  17    Date for PT Re-Evaluation  11/04/17    Authorization Type  [employer] Medicare $40 co pay Visit limit Medical Necessity    PT Start Time  7035    PT Stop Time  1530    PT Time Calculation (min)  45 min    Equipment Utilized During Treatment  Gait belt    Activity Tolerance  Patient tolerated treatment well;Patient limited by fatigue;Patient limited by pain    Behavior During Therapy  [facility] for tasks assessed/performed       Past Medical History:  Diagnosis Date  . Chronic ITP (idiopathic thrombocytopenia) (HCC)   . Coronary artery disease 1992   MI  . DVT (deep venous thrombosis) ([place])   . 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 Hypertension   . Lupus anticoagulant disorder ([place])   . Multiple closed anterior-posterior compression fractures of pelvis ([place])   . Seizures ([place])   . Stroke [facility]) 02/27/2011   Left side weakness    Past Surgical History:  Procedure Laterality Date  . blood clot Right    surgical removal  . CHOLECYSTECTOMY    . CORONARY ANGIOPLASTY  1992  . SPLENECTOMY, TOTAL    . vertebralplasty      There were no vitals filed for this visit.  Subjective Assessment - 08/25/17 1453    Subjective  No falls. He has been doing the exercises that the PT reviewed last session.     Patient is accompained by:  Family member    Limitations  Standing;Walking;House hold activities    Patient Stated Goals  Update exercises especially right leg OA, would like ones that he can see progress., To walk with RW better, safer.     Currently in Pain?  Yes    Pain Score  4     Pain  Location  Hip    Pain Orientation  Right    Pain Descriptors / Indicators  Aching;Sharp    Pain Type  Chronic pain    Pain Onset  More than a month ago    Pain Frequency  Constant    Aggravating Factors   arthritis, standing up, walking a lot    Pain Relieving Factors  medications, heat, laydown    Effect of Pain on Daily Activities  limts walking    Pain Location  Foot    Pain Orientation  Right arch    Pain Descriptors / Indicators  Burning    Pain Type  Chronic pain    Pain Onset  More than a month ago    Pain Frequency  Intermittent    Aggravating Factors   walking & standing    Pain Score  4    Pain Location  Back    Pain Orientation  Right;Lower    Pain Descriptors / Indicators  Aching    Pain Type  Chronic pain    Pain Onset  More than a month ago    Pain Frequency  Intermittent    Aggravating Factors   standing & walking  Balance Exercises - 08/25/17 1445      OTAGO PROGRAM   Head Movements  Sitting;5 reps    Neck Movements  Sitting;5 reps    Back Extension  Standing;5 reps    Trunk Movements  Standing;5 reps    Ankle Movements  Sitting;10 reps    Knee Extensor  10 reps        PT Education - 08/25/17 1515    Education provided  Yes    Education Details  custom Thermoplastic AFO articulating, would need shoe 1-1.5 size bigger to accomodate;  donning compression socks; ankle & foot stretches    Person(s) Educated  Patient;Spouse;Caregiver(s)    Methods  Explanation;Demonstration;Verbal cues;Handout;Tactile cues;Other (comment) AFOs demo model in clinic    Comprehension  Verbalized understanding;Need further instruction       PT Short Term Goals - 08/25/17 1203      PT SHORT TERM GOAL #1   Title  Patient ambulates 150' with left platform RW & AFO with minimal guard.  (All STGs Target Date 09/04/2017)    Time  4    Period  Weeks    Status  On-going    Target Date  09/04/17      PT SHORT TERM GOAL #2   Title   Patient & wife verbalize understanding of initial HEP.     Time  4    Period  Weeks    Status  On-going    Target Date  09/04/17      PT SHORT TERM GOAL #3   Title  Patient & wife demonstrate & verbalize proper use of recumbent stepper machines at [facility] and has gone to [facility] at least 2 times.     Time  4    Period  Weeks    Status  On-going    Target Date  09/04/17      PT SHORT TERM GOAL #4   Title  Gait Velocity with platform RW & AFO >1.8 ft/sec    Time  4    Period  Weeks    Status  On-going    Target Date  09/04/17        PT Long Term Goals - 08/25/17 1804      PT LONG TERM GOAL #1   Title  Patient and wife demonstrate and verbalize understanding of ongoing fitness plan including [employer]. (All Target LTGs 10/31/2017)    Time  12    Status  On-going    Target Date  10/31/17      PT LONG TERM GOAL #2   Title  Patient ambulates 300' on paved surfaces with AFO & left platform RW with supervision.     Time  12    Period  Weeks    Status  On-going    Target Date  10/31/17      PT LONG TERM GOAL #3   Title  Patient negotiates ramps, curbs with platform RW and stairs (1 rail) with AFO with supervision.       Time  12    Period  Weeks    Status  On-going    Target Date  10/31/17      PT LONG TERM GOAL #4   Title  Patient perform standing balance with left platform on RW support reaching 5" anteriorly & to knee level towards ground and manages pants for toileting modified independent.     Time  12    Period  Weeks    Status  On-going  Target Date  10/31/17      PT LONG TERM GOAL #5   Title  Patient ambulates 71' around furniture with left platform RW & AFO modified independent.     Time  12    Period  Weeks    Status  On-going    Target Date  10/31/17            Plan - 08/25/17 1828    Clinical Impression Statement  Patient and wife appear receptive to custom thermoplastic AFO for improved control. His wife reports insurance paid for new AFO recently so it  may not be covered. Grandson appears to understand ankle, foot & hip stretch added today.     Rehab Potential  Good    Clinical Impairments Affecting Rehab Potential  left hypertonicity & hemiplegia, right LE OA    PT Frequency  2x / week 2x/wk for 4 weeks, then 1x/wk for 8 weeks.    PT Duration  12 weeks    PT Treatment/Interventions  ADLs/Self Care Home Management;Moist Heat;DME Instruction;Gait training;Stair training;Functional mobility training;Therapeutic activities;Therapeutic exercise;Balance training;Neuromuscular re-education;Patient/family education;Orthotic Fit/Training;Vestibular    PT Next Visit Plan  continue with review of OTAGO & update as needed/indicated, SciFit with instruction how to set-up & use at [facility] and Agree with Plan of Care  Patient;Family member/caregiver    Family Member Consulted  wife, [name] & grandson       Patient will benefit from skilled therapeutic intervention in order to improve the following deficits and impairments:  Abnormal gait, Decreased activity tolerance, Decreased balance, Decreased endurance, Decreased coordination, Decreased cognition, Decreased mobility, Decreased range of motion, Decreased safety awareness, Decreased strength, Dizziness, Impaired flexibility, Impaired tone, Impaired sensation, Impaired UE functional use, Postural dysfunction, Pain  Visit Diagnosis: Other abnormalities of gait and mobility  Unsteadiness on feet  Paralytic gait  Muscle weakness (generalized)  Abnormal posture  Repeated falls  Pain in right hip     Problem List Patient Active Problem List   Diagnosis Date Noted  . Pulmonary nodule 10/24/2016  . Acute hypoxemic respiratory failure ([place]) 10/21/2016  . Sepsis ([place]) 10/21/2016  . DVT (deep venous thrombosis) ([place]) 10/12/2016  . Supratherapeutic INR 10/12/2016  . Acute deep vein thrombosis (DVT) of popliteal vein of right lower extremity ([place]) 10/12/2016  . AKI (acute kidney injury)  ([place]) 10/12/2016  . Fever 10/12/2016  . Leukocytosis 10/12/2016  . Bacteriuria 10/12/2016  . Deep vein thrombosis (DVT) ([place]) 03/02/2015  . Lupus anticoagulant disorder ([place]) 03/02/2015  . Ischemic stroke ([place]) 10/04/2014  . 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 Deep vein thrombosis ([place]) 10/04/2014  . 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 LA (lupus anticoagulant) disorder ([place]) 10/04/2014  . Arteriosclerosis of coronary artery 09/10/2012  . Apnea, sleep 09/10/2012  . 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 Dermatophytic onychia 01/29/2012  . Spastic hemiplegia ([place]) 12/03/2011  . Hemiparesis, left ([place]) 10/31/2011  . Dysphonia 08/13/2011  . Idiopathic thrombocytopenic purpura ([place]) 08/12/2011  . ITP (idiopathic thrombocytopenic purpura) 08/12/2011    Jamey Reas  PT, DPT 08/26/2017, 4:42 PM  [facility] [address] [place], [place], [address] Phone: [phone]   Fax:  [phone]  Name: Mark [name] MRN: [id number] Date of Birth: 05-Oct-1945

## 2017-08-27 ENCOUNTER — Encounter: Payer: Self-pay | Admitting: Physical Therapy

## 2017-08-27 ENCOUNTER — Ambulatory Visit: Payer: Medicare HMO | Admitting: Physical Therapy

## 2017-08-27 DIAGNOSIS — M25551 Pain in right hip: Secondary | ICD-10-CM | POA: Diagnosis not present

## 2017-08-27 DIAGNOSIS — R2689 Other abnormalities of gait and mobility: Secondary | ICD-10-CM | POA: Diagnosis not present

## 2017-08-27 DIAGNOSIS — R296 Repeated falls: Secondary | ICD-10-CM | POA: Diagnosis not present

## 2017-08-27 DIAGNOSIS — R261 Paralytic gait: Secondary | ICD-10-CM | POA: Diagnosis not present

## 2017-08-27 DIAGNOSIS — M6281 Muscle weakness (generalized): Secondary | ICD-10-CM

## 2017-08-27 DIAGNOSIS — R2681 Unsteadiness on feet: Secondary | ICD-10-CM | POA: Diagnosis not present

## 2017-08-27 DIAGNOSIS — R293 Abnormal posture: Secondary | ICD-10-CM | POA: Diagnosis not present

## 2017-08-28 NOTE — Therapy (Signed)
[facility] [address] [place], [place], [address] Phone: [phone]   Fax:  [phone]  Physical Therapy Treatment  Patient Details  Name: Mark [name] MRN: [id number] Date of Birth: Feb 08, 1946 Referring Provider: Theone Murdoch, MD   Encounter Date: 08/27/2017  PT End of Session - 08/27/17 1834    Visit Number  5    Number of Visits  17    Date for PT Re-Evaluation  11/04/17    Authorization Type  [employer] Medicare $40 co pay Visit limit Medical Necessity    PT Start Time  1315    PT Stop Time  1408    PT Time Calculation (min)  53 min    Equipment Utilized During Treatment  Gait belt    Activity Tolerance  Patient tolerated treatment well;Patient limited by fatigue;Patient limited by pain    Behavior During Therapy  [facility] for tasks assessed/performed       Past Medical History:  Diagnosis Date  . Chronic ITP (idiopathic thrombocytopenia) (HCC)   . Coronary artery disease 1992   MI  . DVT (deep venous thrombosis) ([place])   . 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 Hypertension   . Lupus anticoagulant disorder ([place])   . Multiple closed anterior-posterior compression fractures of pelvis ([place])   . Seizures ([place])   . Stroke [facility]) 02/27/2011   Left side weakness    Past Surgical History:  Procedure Laterality Date  . blood clot Right    surgical removal  . CHOLECYSTECTOMY    . CORONARY ANGIOPLASTY  1992  . SPLENECTOMY, TOTAL    . vertebralplasty      There were no vitals filed for this visit.  Subjective Assessment - 08/27/17 1321    Subjective  No falls since started using RW.     Patient is accompained by:  Family member    Limitations  Standing;Walking;House hold activities    Patient Stated Goals  Update exercises especially right leg OA, would like ones that he can see progress., To walk with RW better, safer.     Currently in Pain?  Yes    Pain Score  5  in last week, worst 9/10 doing stairs to get out of house,  best 1-2 /10    Pain Location  Hip    Pain Orientation  Right    Pain Descriptors / Indicators  Aching;Sharp    Pain Type  Chronic pain    Pain Onset  More than a month ago    Pain Frequency  Constant    Aggravating Factors   arthritis, standing up, walking a lot    Pain Relieving Factors  medications, heat, laying down    Effect of Pain on Daily Activities  limits walking                       [facility] Adult PT Treatment/Exercise - 08/27/17 1315      Ambulation/Gait   Ambulation/Gait  Yes    Ambulation/Gait Assistance  4: Min assist    Ambulation/Gait Assistance Details  tactile & verbal cues on weight shift over LLE stande and step width    Ambulation Distance (Feet)  100 Feet 100' X 2    Assistive device  Left platform walker;Other (Comment) AFO    Ambulation Surface  Indoor;Level;Outdoor;Paved    Stairs  --    Stairs Assistance  --    Stairs Assistance Details (indicate cue type and reason)  Grandson demonstrated  how [employer] steps at home. PT plans to address over next few sessions.       Exercises   Exercises  Ankle      Lumbar Exercises: Stretches   Active Hamstring Stretch  --    Active Hamstring Stretch Limitations  --    Passive Hamstring Stretch  Right;Left;3 reps;20 seconds    Passive Hamstring Stretch Limitations  seated straight leg    Single Knee to Chest Stretch  Right;Left;3 reps;20 seconds    Single Knee to Chest Stretch Limitations  modified foot on chair anteriorly then bringing chest to knee    Piriformis Stretch  Right;Left;2 reps;20 seconds    Piriformis Stretch Limitations  seated with LE crossed & knee depressed      Ankle Exercises: Stretches   Other Stretch  toe abduction placing fingers between toes  LLE 2 reps 20 sec    Other Stretch  toe flexion & extension with gentle traction 2 reps each toe 5 seconds          Balance Exercises - 08/27/17 1315      OTAGO PROGRAM   Knee Flexor  10 reps standing with platform RW &  wife's assist    Hip ABductor  10 reps standing with platform RW & wife's assist          PT Short Term Goals - 08/25/17 1203      PT SHORT TERM GOAL #1   Title  Patient ambulates 150' with left platform RW & AFO with minimal guard.  (All STGs Target Date 09/04/2017)    Time  4    Period  Weeks    Status  On-going    Target Date  09/04/17      PT SHORT TERM GOAL #2   Title  Patient & wife verbalize understanding of initial HEP.     Time  4    Period  Weeks    Status  On-going    Target Date  09/04/17      PT SHORT TERM GOAL #3   Title  Patient & wife demonstrate & verbalize proper use of recumbent stepper machines at [facility] and has gone to [facility] at least 2 times.     Time  4    Period  Weeks    Status  On-going    Target Date  09/04/17      PT SHORT TERM GOAL #4   Title  Gait Velocity with platform RW & AFO >1.8 ft/sec    Time  4    Period  Weeks    Status  On-going    Target Date  09/04/17        PT Long Term Goals - 08/25/17 1804      PT LONG TERM GOAL #1   Title  Patient and wife demonstrate and verbalize understanding of ongoing fitness plan including [employer]. (All [employer] LTGs 10/31/2017)    Time  12    Status  On-going    Target Date  10/31/17      PT LONG TERM GOAL #2   Title  Patient ambulates 300' on paved surfaces with AFO & left platform RW with supervision.     Time  12    Period  Weeks    Status  On-going    Target Date  10/31/17      PT LONG TERM GOAL #3   Title  Patient negotiates ramps, curbs with platform RW and stairs (1 rail) with AFO with supervision.  Time  12    Period  Weeks    Status  On-going    Target Date  10/31/17      PT LONG TERM GOAL #4   Title  Patient perform standing balance with left platform on RW support reaching 5" anteriorly & to knee level towards ground and manages pants for toileting modified independent.     Time  12    Period  Weeks    Status  On-going    Target Date  10/31/17      PT LONG TERM GOAL #5    Title  Patient ambulates 81' around furniture with left platform RW & AFO modified independent.     Time  12    Period  Weeks    Status  On-going    Target Date  10/31/17            Plan - 08/27/17 2014    Clinical Impression Statement  Patient, wife & grandson seem to understand stretches for hip/lumbar and foot. The platform RW appear to be reducing Right hip pain and reducing falls.     Rehab Potential  Good    Clinical Impairments Affecting Rehab Potential  left hypertonicity & hemiplegia, right LE OA    PT Frequency  2x / week 2x/wk for 4 weeks, then 1x/wk for 8 weeks.    PT Duration  12 weeks    PT Treatment/Interventions  ADLs/Self Care Home Management;Moist Heat;DME Instruction;Gait training;Stair training;Functional mobility training;Therapeutic activities;Therapeutic exercise;Balance training;Neuromuscular re-education;Patient/family education;Orthotic Fit/Training;Vestibular    PT Next Visit Plan  continue with review of OTAGO & update as needed/indicated, SciFit with instruction how to set-up & use at [facility] and Agree with Plan of Care  Patient;Family member/caregiver    Family Member Consulted  wife, [name] & grandson       Patient will benefit from skilled therapeutic intervention in order to improve the following deficits and impairments:  Abnormal gait, Decreased activity tolerance, Decreased balance, Decreased endurance, Decreased coordination, Decreased cognition, Decreased mobility, Decreased range of motion, Decreased safety awareness, Decreased strength, Dizziness, Impaired flexibility, Impaired tone, Impaired sensation, Impaired UE functional use, Postural dysfunction, Pain  Visit Diagnosis: Other abnormalities of gait and mobility  Unsteadiness on feet  Paralytic gait  Muscle weakness (generalized)     Problem List Patient Active Problem List   Diagnosis Date Noted  . Pulmonary nodule 10/24/2016  . Acute hypoxemic respiratory failure  ([place]) 10/21/2016  . Sepsis ([place]) 10/21/2016  . DVT (deep venous thrombosis) ([place]) 10/12/2016  . Supratherapeutic INR 10/12/2016  . Acute deep vein thrombosis (DVT) of popliteal vein of right lower extremity ([place]) 10/12/2016  . AKI (acute kidney injury) ([place]) 10/12/2016  . Fever 10/12/2016  . Leukocytosis 10/12/2016  . Bacteriuria 10/12/2016  . Deep vein thrombosis (DVT) ([place]) 03/02/2015  . Lupus anticoagulant disorder ([place]) 03/02/2015  . Ischemic stroke ([place]) 10/04/2014  . 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 Deep vein thrombosis ([place]) 10/04/2014  . 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 LA (lupus anticoagulant) disorder ([place]) 10/04/2014  . Arteriosclerosis of coronary artery 09/10/2012  . Apnea, sleep 09/10/2012  . 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 Dermatophytic onychia 01/29/2012  . Spastic hemiplegia ([place]) 12/03/2011  . Hemiparesis, left ([place]) 10/31/2011  . Dysphonia 08/13/2011  . Idiopathic thrombocytopenic purpura ([place]) 08/12/2011  . ITP (idiopathic thrombocytopenic purpura) 08/12/2011    Jamey Reas PT, DPT 08/28/2017, 8:18 PM  [facility] [address] [place],  [place], [address] Phone: [phone]   Fax:  [phone]  Name: Mark [name] MRN: [id number] Date of Birth: 1945-09-21

## 2017-09-01 ENCOUNTER — Ambulatory Visit: Payer: Medicare HMO | Admitting: Physical Therapy

## 2017-09-01 ENCOUNTER — Encounter: Payer: Self-pay | Admitting: Physical Therapy

## 2017-09-01 DIAGNOSIS — R261 Paralytic gait: Secondary | ICD-10-CM | POA: Diagnosis not present

## 2017-09-01 DIAGNOSIS — R293 Abnormal posture: Secondary | ICD-10-CM | POA: Diagnosis not present

## 2017-09-01 DIAGNOSIS — R2681 Unsteadiness on feet: Secondary | ICD-10-CM | POA: Diagnosis not present

## 2017-09-01 DIAGNOSIS — R296 Repeated falls: Secondary | ICD-10-CM | POA: Diagnosis not present

## 2017-09-01 DIAGNOSIS — M6281 Muscle weakness (generalized): Secondary | ICD-10-CM | POA: Diagnosis not present

## 2017-09-01 DIAGNOSIS — R2689 Other abnormalities of gait and mobility: Secondary | ICD-10-CM | POA: Diagnosis not present

## 2017-09-01 DIAGNOSIS — M25551 Pain in right hip: Secondary | ICD-10-CM | POA: Diagnosis not present

## 2017-09-01 NOTE — Therapy (Signed)
[facility] [address] [place], [place], [address] Phone: [phone]   Fax:  [phone]  Physical Therapy Treatment  Patient Details  Name: Mark [name] MRN: [id number] Date of Birth: 01/24/1946 Referring Provider: Theone Murdoch, MD   Encounter Date: 09/01/2017  PT End of Session - 09/01/17 1701    Visit Number  6    Number of Visits  17    Date for PT Re-Evaluation  11/04/17    Authorization Type  [employer] Medicare $40 co pay Visit limit Medical Necessity    PT Start Time  4665    PT Stop Time  1535    PT Time Calculation (min)  38 min    Equipment Utilized During Treatment  Gait belt    Activity Tolerance  Patient tolerated treatment well;Patient limited by fatigue    Behavior During Therapy  [facility] for tasks assessed/performed       Past Medical History:  Diagnosis Date  . Chronic ITP (idiopathic thrombocytopenia) (HCC)   . Coronary artery disease 1992   MI  . DVT (deep venous thrombosis) ([place])   . 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 Hypertension   . Lupus anticoagulant disorder ([place])   . Multiple closed anterior-posterior compression fractures of pelvis ([place])   . Seizures ([place])   . Stroke [facility]) 02/27/2011   Left side weakness    Past Surgical History:  Procedure Laterality Date  . blood clot Right    surgical removal  . CHOLECYSTECTOMY    . CORONARY ANGIOPLASTY  1992  . SPLENECTOMY, TOTAL    . vertebralplasty      There were no vitals filed for this visit.  Subjective Assessment - 09/01/17 1459    Subjective  No falls. He was exhausted over the weekend.     Patient is accompained by:  Family member    Limitations  Standing;Walking;House hold activities    Patient Stated Goals  Update exercises especially right leg OA, would like ones that he can see progress., To walk with RW better, safer.     Currently in Pain?  Yes    Pain Score  4  walking 5/10, sitting / rest 3-4/5    Pain Location  Hip    Pain  Orientation  Right    Pain Descriptors / Indicators  Aching;Sharp    Pain Onset  More than a month ago    Pain Frequency  Constant    Pain Score  3    Pain Location  Foot    Pain Orientation  Right    Pain Descriptors / Indicators  Burning    Pain Type  Chronic pain    Pain Onset  More than a month ago    Pain Frequency  Constant    Pain Score  5    Pain Location  Back    Pain Orientation  Right;Lower    Pain Descriptors / Indicators  Aching    Pain Type  Chronic pain    Pain Onset  More than a month ago    Pain Frequency  Intermittent                       [facility] Adult PT Treatment/Exercise - 09/01/17 1500      Transfers   Transfers  Sit to Stand;Stand to Sit    Sit to Stand  5: Supervision;With upper extremity assist;With armrests;From chair/3-in-1 to platform RW    Stand to Sit  5: Supervision;With upper extremity assist;With armrests;To chair/3-in-1 from platform RW      Ambulation/Gait   Ambulation/Gait  Yes    Ambulation/Gait Assistance  4: Min guard    Ambulation/Gait Assistance Details  tactile & verbal cues on posture, position within RW and step width.     Ambulation Distance (Feet)  150 Feet 66' & 150'    Assistive device  Left platform walker;Other (Comment) AFO    Ambulation Surface  Indoor;Level    Gait velocity  1.70 ft/sec    Stairs  Yes    Stairs Assistance  4: Min assist    Stairs Assistance Details (indicate cue type and reason)  PT verbal, tactile & demo cues on sequence (ascend with LLE & descend RLE due to hypertonicity), step width, location to rail and RLE descend with initial contact with toes    Stair Management Technique  One rail Right;Step to pattern;Forwards    Number of Stairs  4 2 reps    Ramp  4: Min assist platform RW & AFO    Ramp Details (indicate cue type and reason)  verbal & tactile cues on upright posture, weight shift & foot position    Curb  4: Min assist platform RW & prosthesis    Curb Details (indicate cue type and  reason)  verbal & manual cues on sequence, weight shift & foot position/ motion.              PT Education - 09/01/17 1600    Education provided  Yes    Education Details  need to initiate [employer] exercise program to develop habit & enable PT to problem solve issues.     Person(s) Educated  Patient;Caregiver(s)    Methods  Explanation;Verbal cues    Comprehension  Verbalized understanding;Need further instruction       PT Short Term Goals - 09/01/17 1719      PT SHORT TERM GOAL #1   Title  Patient ambulates 150' with left platform RW & AFO with minimal guard.  (All STGs Target Date 09/04/2017)    Baseline  MET 09/01/2017    Time  4    Period  Weeks    Status  Achieved      PT SHORT TERM GOAL #2   Title  Patient & wife verbalize understanding of initial HEP.     Time  4    Period  Weeks    Status  On-going      PT SHORT TERM GOAL #3   Title  Patient & wife demonstrate & verbalize proper use of recumbent stepper machines at [facility] and has gone to [facility] at least 2 times.     Baseline  NOT MET 09/01/2017    Time  4    Period  Weeks    Status  Not Met      PT SHORT TERM GOAL #4   Title  Gait Velocity with platform RW & AFO >1.8 ft/sec    Baseline  NOT  MET but progressing Gait Velocity improved to 1.7 ft/sec from 1.53 ft/sec    Time  4    Period  Weeks    Status  Not Met       PT Short Term Goals - 09/01/17 1726      PT SHORT TERM GOAL #1   Title  Patient demonstrates understanding of OTAGO HEP. (All STGs Target Date 10/03/2017)    Time  4    Period  Weeks    Status  New    Target Date  10/03/17      PT SHORT TERM GOAL #2   Title  Patient ambulates 250' with platform RW & AFO with intermittent min guard.     Time  4    Period  Weeks    Status  New    Target Date  10/03/17      PT SHORT TERM GOAL #3   Title  Patient negotiates ramp & curb with platform RW & AFO with min guard.     Time  4    Period  Weeks    Status  New    Target Date  10/03/17           PT Long Term Goals - 08/25/17 1804      PT LONG TERM GOAL #1   Title  Patient and wife demonstrate and verbalize understanding of ongoing fitness plan including [employer]. (All Target LTGs 10/31/2017)    Time  12    Status  On-going    Target Date  10/31/17      PT LONG TERM GOAL #2   Title  Patient ambulates 300' on paved surfaces with AFO & left platform RW with supervision.     Time  12    Period  Weeks    Status  On-going    Target Date  10/31/17      PT LONG TERM GOAL #3   Title  Patient negotiates ramps, curbs with platform RW and stairs (1 rail) with AFO with supervision.       Time  12    Period  Weeks    Status  On-going    Target Date  10/31/17      PT LONG TERM GOAL #4   Title  Patient perform standing balance with left platform on RW support reaching 5" anteriorly & to knee level towards ground and manages pants for toileting modified independent.     Time  12    Period  Weeks    Status  On-going    Target Date  10/31/17      PT LONG TERM GOAL #5   Title  Patient ambulates 38' around furniture with left platform RW & AFO modified independent.     Time  12    Period  Weeks    Status  On-going    Target Date  10/31/17            Plan - 09/01/17 1721    Clinical Impression Statement  Patient and grandson seem to understand stairs with right rail, ramps & curbs with platform RW better with improved safety. Patient has faster gait speed but not to STG level.     Rehab Potential  Good    Clinical Impairments Affecting Rehab Potential  left hypertonicity & hemiplegia, right LE OA    PT Frequency  2x / week 2x/wk for 4 weeks, then 1x/wk for 8 weeks.    PT Duration  12 weeks    PT Treatment/Interventions  ADLs/Self Care Home Management;Moist Heat;DME Instruction;Gait training;Stair training;Functional mobility training;Therapeutic activities;Therapeutic exercise;Balance training;Neuromuscular re-education;Patient/family education;Orthotic  Fit/Training;Vestibular    PT Next Visit Plan  continue with review of OTAGO & update as needed/indicated, SciFit with instruction how to set-up & use at [facility] and Agree with Plan of Care  Patient;Family member/caregiver    Family Member Consulted  wife, [name] & grandson       Patient will benefit from skilled therapeutic intervention in  order to improve the following deficits and impairments:  Abnormal gait, Decreased activity tolerance, Decreased balance, Decreased endurance, Decreased coordination, Decreased cognition, Decreased mobility, Decreased range of motion, Decreased safety awareness, Decreased strength, Dizziness, Impaired flexibility, Impaired tone, Impaired sensation, Impaired UE functional use, Postural dysfunction, Pain  Visit Diagnosis: Other abnormalities of gait and mobility  Unsteadiness on feet  Paralytic gait  Muscle weakness (generalized)  Abnormal posture     Problem List Patient Active Problem List   Diagnosis Date Noted  . Pulmonary nodule 10/24/2016  . Acute hypoxemic respiratory failure ([place]) 10/21/2016  . Sepsis ([place]) 10/21/2016  . DVT (deep venous thrombosis) ([place]) 10/12/2016  . Supratherapeutic INR 10/12/2016  . Acute deep vein thrombosis (DVT) of popliteal vein of right lower extremity ([place]) 10/12/2016  . AKI (acute kidney injury) ([place]) 10/12/2016  . Fever 10/12/2016  . Leukocytosis 10/12/2016  . Bacteriuria 10/12/2016  . Deep vein thrombosis (DVT) ([place]) 03/02/2015  . Lupus anticoagulant disorder ([place]) 03/02/2015  . Ischemic stroke ([place]) 10/04/2014  . 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 Deep vein thrombosis ([place]) 10/04/2014  . 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 LA (lupus anticoagulant) disorder ([place]) 10/04/2014  . Arteriosclerosis of coronary artery 09/10/2012  . Apnea, sleep 09/10/2012  . 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 Dermatophytic onychia 01/29/2012  . Spastic hemiplegia ([place]) 12/03/2011  .  Hemiparesis, left ([place]) 10/31/2011  . Dysphonia 08/13/2011  . Idiopathic thrombocytopenic purpura ([place]) 08/12/2011  . ITP (idiopathic thrombocytopenic purpura) 08/12/2011    Jamey Reas PT, DPT 09/01/2017, 5:25 PM  [facility] [address] [place], [place], [address] Phone: [phone]   Fax:  [phone]  Name: Mark [name] MRN: [id number] Date of Birth: 1946/02/15

## 2017-09-04 ENCOUNTER — Ambulatory Visit: Payer: Medicare HMO | Admitting: Physical Therapy

## 2017-09-10 ENCOUNTER — Encounter: Payer: Self-pay | Admitting: Physical Therapy

## 2017-09-10 ENCOUNTER — Ambulatory Visit: Payer: Medicare HMO | Admitting: Physical Therapy

## 2017-09-10 DIAGNOSIS — R261 Paralytic gait: Secondary | ICD-10-CM | POA: Diagnosis not present

## 2017-09-10 DIAGNOSIS — M25551 Pain in right hip: Secondary | ICD-10-CM | POA: Diagnosis not present

## 2017-09-10 DIAGNOSIS — R2689 Other abnormalities of gait and mobility: Secondary | ICD-10-CM | POA: Diagnosis not present

## 2017-09-10 DIAGNOSIS — R293 Abnormal posture: Secondary | ICD-10-CM

## 2017-09-10 DIAGNOSIS — R296 Repeated falls: Secondary | ICD-10-CM | POA: Diagnosis not present

## 2017-09-10 DIAGNOSIS — R2681 Unsteadiness on feet: Secondary | ICD-10-CM

## 2017-09-10 DIAGNOSIS — M6281 Muscle weakness (generalized): Secondary | ICD-10-CM | POA: Diagnosis not present

## 2017-09-11 ENCOUNTER — Ambulatory Visit (HOSPITAL_BASED_OUTPATIENT_CLINIC_OR_DEPARTMENT_OTHER): Payer: Medicare HMO | Attending: Internal Medicine | Admitting: Internal Medicine

## 2017-09-11 VITALS — Ht 69.5 in | Wt 196.0 lb

## 2017-09-11 DIAGNOSIS — G4733 Obstructive sleep apnea (adult) (pediatric): Secondary | ICD-10-CM | POA: Diagnosis not present

## 2017-09-11 DIAGNOSIS — R0683 Snoring: Secondary | ICD-10-CM

## 2017-09-11 DIAGNOSIS — G4710 Hypersomnia, unspecified: Secondary | ICD-10-CM

## 2017-09-11 DIAGNOSIS — R0681 Apnea, not elsewhere classified: Secondary | ICD-10-CM | POA: Diagnosis present

## 2017-09-11 NOTE — Therapy (Signed)
[place] [address] [place], [place], [address] Phone: [phone]   Fax:  [phone]  Physical Therapy Treatment  Patient Details  Name: Mark [name] MRN: [id number] Date of Birth: 07-Nov-1945 Referring Provider: Theone Murdoch, MD   Encounter Date: 09/10/2017  PT End of Session - 09/10/17 2357    Visit Number  7    Number of Visits  17    Date for PT Re-Evaluation  11/04/17    Authorization Type  AETNA Medicare $40 co pay Visit limit Medical Necessity    PT Start Time  7035    PT Stop Time  1530    PT Time Calculation (min)  45 min    Equipment Utilized During Treatment  Gait belt    Activity Tolerance  Patient tolerated treatment well;Patient limited by fatigue    Behavior During Therapy  [facility] for tasks assessed/performed       Past Medical History:  Diagnosis Date  . Chronic ITP (idiopathic thrombocytopenia) (HCC)   . Coronary artery disease 1992   MI  . DVT (deep venous thrombosis) ([place])   . 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 Hypertension   . Lupus anticoagulant disorder ([place])   . Multiple closed anterior-posterior compression fractures of pelvis ([place])   . Seizures ([place])   . Stroke [facility]) 02/27/2011   Left side weakness    Past Surgical History:  Procedure Laterality Date  . blood clot Right    surgical removal  . CHOLECYSTECTOMY    . CORONARY ANGIOPLASTY  1992  . SPLENECTOMY, TOTAL    . vertebralplasty      There were no vitals filed for this visit.  Subjective Assessment - 09/10/17 1445    Subjective  No falls. He has not made it to [facility] yet.     Patient is accompained by:  Family member    Limitations  Standing;Walking;House hold activities    Patient Stated Goals  Update exercises especially right leg OA, would like ones that he can see progress., To walk with RW better, safer.     Pain Score  7     Pain Location  Hip    Pain Orientation  Right    Pain Descriptors / Indicators  Aching;Sharp     Pain Type  Chronic pain    Pain Onset  More than a month ago    Pain Frequency  Constant    Aggravating Factors   arthritis, missed pain pill,     Pain Relieving Factors  medications, heat, laying down    Effect of Pain on Daily Activities  limits walking    Pain Onset  More than a month ago    Pain Onset  More than a month ago                       [facility] Adult PT Treatment/Exercise - 09/10/17 1445      Ambulation/Gait   Ambulation/Gait  Yes    Ambulation/Gait Assistance  4: Min guard    Ambulation/Gait Assistance Details  PT replaced platform attachement with new style that decreases shoulder abduction so his bears weight on LUE better and attachment bolts do not protude towards patient's LLE. Pt reported that it felt better.     Ambulation Distance (Feet)  150 Feet 150' X 2 & 50'    Assistive device  Left platform walker;Other (Comment) AFO    Ambulation Surface  Indoor;Level  Knee/Hip Exercises: Aerobic   Stepper  SciFit level 1 with BLEs & RUE for 5 minutes 2 sets with cues for full ROM.  During rest between sets & end of 2nd set, used machine to stretch / work LUE in controlled slow manner. 10 reps UE extension 10sec hold and flexion 10 sec hold.   PT instructed pt & grandson in set up & use at [facility]             PT Education - 09/10/17 1515    Education provided  Yes    Education Details  SciFit recumbent stepper set up & use at [employer].    Person(s) Educated  Patient;Caregiver(s)    Methods  Explanation;Demonstration;Verbal cues;Tactile cues    Comprehension  Verbalized understanding;Returned demonstration;Need further instruction       PT Short Term Goals - 09/01/17 1726      PT SHORT TERM GOAL #1   Title  Patient demonstrates understanding of OTAGO HEP. (All STGs Target Date 10/03/2017)    Time  4    Period  Weeks    Status  New    Target Date  10/03/17      PT SHORT TERM GOAL #2   Title  Patient ambulates 250' with platform RW & AFO with  intermittent min guard.     Time  4    Period  Weeks    Status  New    Target Date  10/03/17      PT SHORT TERM GOAL #3   Title  Patient negotiates ramp & curb with platform RW & AFO with min guard.     Time  4    Period  Weeks    Status  New    Target Date  10/03/17        PT Long Term Goals - 08/25/17 1804      PT LONG TERM GOAL #1   Title  Patient and wife demonstrate and verbalize understanding of ongoing fitness plan including [employer]. (All Target LTGs 10/31/2017)    Time  12    Status  On-going    Target Date  10/31/17      PT LONG TERM GOAL #2   Title  Patient ambulates 300' on paved surfaces with AFO & left platform RW with supervision.     Time  12    Period  Weeks    Status  On-going    Target Date  10/31/17      PT LONG TERM GOAL #3   Title  Patient negotiates ramps, curbs with platform RW and stairs (1 rail) with AFO with supervision.       Time  12    Period  Weeks    Status  On-going    Target Date  10/31/17      PT LONG TERM GOAL #4   Title  Patient perform standing balance with left platform on RW support reaching 5" anteriorly & to knee level towards ground and manages pants for toileting modified independent.     Time  12    Period  Weeks    Status  On-going    Target Date  10/31/17      PT LONG TERM GOAL #5   Title  Patient ambulates 31' around furniture with left platform RW & AFO modified independent.     Time  12    Period  Weeks    Status  On-going    Target Date  10/31/17  Plan - 09/10/17 1742    Clinical Impression Statement  New platform attachment for RW appears to position LUE for weight bearing more appropriately and he reports feels better. His grandson appears to understand set-up and use of recumbent stepper for [employer] and they plan to attend before next PT session.     Rehab Potential  Good    Clinical Impairments Affecting Rehab Potential  left hypertonicity & hemiplegia, right LE OA    PT Frequency  2x / week 2x/wk  for 4 weeks, then 1x/wk for 8 weeks.    PT Duration  12 weeks    PT Treatment/Interventions  ADLs/Self Care Home Management;Moist Heat;DME Instruction;Gait training;Stair training;Functional mobility training;Therapeutic activities;Therapeutic exercise;Balance training;Neuromuscular re-education;Patient/family education;Orthotic Fit/Training;Vestibular    PT Next Visit Plan  work towards updated STGs    Consulted and Agree with Plan of Care  Patient;Family member/caregiver    Family Member Consulted  grandson       Patient will benefit from skilled therapeutic intervention in order to improve the following deficits and impairments:  Abnormal gait, Decreased activity tolerance, Decreased balance, Decreased endurance, Decreased coordination, Decreased cognition, Decreased mobility, Decreased range of motion, Decreased safety awareness, Decreased strength, Dizziness, Impaired flexibility, Impaired tone, Impaired sensation, Impaired UE functional use, Postural dysfunction, Pain  Visit Diagnosis: Other abnormalities of gait and mobility  Unsteadiness on feet  Paralytic gait  Abnormal posture     Problem List Patient Active Problem List   Diagnosis Date Noted  . Pulmonary nodule 10/24/2016  . Acute hypoxemic respiratory failure ([place]) 10/21/2016  . Sepsis ([place]) 10/21/2016  . DVT (deep venous thrombosis) ([place]) 10/12/2016  . Supratherapeutic INR 10/12/2016  . Acute deep vein thrombosis (DVT) of popliteal vein of right lower extremity ([place]) 10/12/2016  . AKI (acute kidney injury) ([place]) 10/12/2016  . Fever 10/12/2016  . Leukocytosis 10/12/2016  . Bacteriuria 10/12/2016  . Deep vein thrombosis (DVT) ([place]) 03/02/2015  . Lupus anticoagulant disorder ([place]) 03/02/2015  . Ischemic stroke ([place]) 10/04/2014  . 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 Deep vein thrombosis ([place]) 10/04/2014  . 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 LA (lupus anticoagulant) disorder ([place]) 10/04/2014   . Arteriosclerosis of coronary artery 09/10/2012  . Apnea, sleep 09/10/2012  . 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 Dermatophytic onychia 01/29/2012  . Spastic hemiplegia ([place]) 12/03/2011  . Hemiparesis, left ([place]) 10/31/2011  . Dysphonia 08/13/2011  . Idiopathic thrombocytopenic purpura ([place]) 08/12/2011  . ITP (idiopathic thrombocytopenic purpura) 08/12/2011    Jamey Reas PT, DPT 09/11/2017, 7:46 AM  [place] [address] [place], [place], [address] Phone: [phone]   Fax:  [phone]  Name: Mark [name] MRN: [id number] Date of Birth: 01/24/1946

## 2017-09-17 ENCOUNTER — Ambulatory Visit: Payer: Medicare HMO | Admitting: Physical Therapy

## 2017-09-19 ENCOUNTER — Other Ambulatory Visit: Payer: Self-pay | Admitting: *Deleted

## 2017-09-19 NOTE — Procedures (Signed)
   NAME: Mark [name] DATE OF BIRTH:  07/27/1945 MEDICAL RECORD NUMBER [id number]  LOCATION: [facility]  PHYSICIAN: Marius Ditch  DATE OF STUDY: 09/11/2017  SLEEP STUDY TYPE: Positive Airway Pressure Titration               REFERRING PHYSICIAN: Marius Ditch, MD  INDICATION FOR STUDY: Inadequate airway control using APAP, in patient with obstructive and central sleep apnea  EPWORTH SLEEPINESS SCORE:  NA HEIGHT: 5' 9.5" (176.5 cm)  WEIGHT: 196 lb (88.9 kg)    Body mass index is 28.53 kg/m.  NECK SIZE: 16.5 in.  MEDICATIONS  Patient self administered medications include: ZOLPIDEM TARTRATE. Medications administered during study include No sleep medicine administered.Marland Kitchen   SLEEP STUDY TECHNIQUE  The patient underwent an attended overnight polysomnography titration to assess the effects of cpap therapy. The following variables were monitored: EEG(C4-A1, C3-A2, O1-A2, O2-A1), EOG, submental and leg EMG, ECG, oxyhemoglobin saturation by pulse oximetry, thoracic and abdominal respiratory effort belts, nasal/oral airflow by pressure sensor, body position sensor and snoring sensor. CPAP pressure was titrated to eliminate apneas, hypopneas and oxygen desaturation.   TECHNICAL COMMENTS  Comments added by Technician: Patient was restless all through the night. Comments added by Scorer: N/A   SLEEP ARCHITECTURE  The study was initiated at 10:56:36 PM and terminated at 5:00:54 AM. Total recorded time was 364.3 minutes. EEG confirmed total sleep time was 240 minutes yielding a sleep efficiency of 65.9%%. Sleep onset after lights out was 18.9 minutes with a REM latency of 143.5 minutes. The patient spent 18.1%% of the night in stage N1 sleep, 66.3%% in stage N2 sleep, 0.0%% in stage N3 and 15.63% in REM. The Arousal Index was 21.0/hour.   RESPIRATORY PARAMETERS  The overall AHI was 5.5 per hour, and the RDI was 12.3 events/hour with a central apnea index of 2.0per hour.  The most appropriate setting of CPAP was IPAP/EPAP 12/12 cm H2O. At this setting, the sleep efficiency was 97%. The AHI was 0.0 events per hour, and the RDI was 2.0 events/hour (with 2.0 central events) and the arousal index was 4.0 per hour at CPAP 12 cm H2O.The oxygen nadir was 91.0% during sleep.  LEG MOVEMENT DATA  The total leg movements were 0 with a resulting leg movement index of 0.0/hr. Associated arousal with leg movement index was 0.0/hr.   CARDIAC DATA  The underlying cardiac rhythm was most consistent with sinus rhythm. Mean heart rate during sleep was 67.2 bpm. Additional rhythm abnormalities include None.   IMPRESSIONS  Excellent airway control with CPAP 12.  The patient tolerated the procedure very well.   DIAGNOSIS  Obstructive Sleep Apnea (327.23 [G47.33 ICD-10])  RECOMMENDATIONS  CPAP at 12 cm H2O with or without ramp.  In the study, the patient used a Medium Wide size Philips Respironics Full Face Mask Dreamwear mask and heated humidification.   Marius Ditch Sleep specialist, [place] [employer]  [employer]  ELECTRONICALLY SIGNED ON:  09/19/2017, 6:52 PM [place] PH: [phone]   FX: [phone] [place]

## 2017-09-19 NOTE — Patient Outreach (Addendum)
[facility]) Care Management  09/19/2017  Mark [name] 1945/06/16 [id number]   Telephone Screen  Referral Date: 09/18/17  Referral Source: MD Referral from Dr Harlan Stains Shirlean Mylar) Referral Reason: patient has history of stroke, wife needs assistance providing care Insurance: [facility]  Outreach attempt #1 Patient is able to verify HIPAA with the wife and Mark [name] gave verbal permission for wife to speak on his behalf. He provided responses to questions also Reviewed and addressed referral to [facility] with patient Mark [name] primarily reports needing assistance to find a reputable personal care provider, light house keeping services They report of need of new glasses, and hearing aids.  Mark [name] has contacted her insurance company but reports each is too expensive. Mark [name] has had a recent sleep study to get a new cpap   Social: Mark [name] lives with his wife and their pets (3 dogs, 6 cats) wife with medical issues of her own (disabled/retired, arthritis, DM, etc), assistance at intervals from a grandson. Mark Yanke loves music and uses his computer now at intervals. Mark Rodrigue does report some issues with depression s/s recently and Mark [name] was noted x 2 during the call to be tearful. Cm allowed them to ventilate their feelings and provided support.  Conditions: Stroke with left hemiparesis & left foot drop, ITP, Lupus, Depression, Hx of DVT, sleep apnea, HTN but wife reports recent hypotension concerns and discontinuation of BP medicines, CKD, epilepsy, MI in 1992 wt 199 lbs ht  70 inches   Medications: Mark Somers reports no issues with getting, affording and understanding his medications Reports most expensive medicine is [place]  Appointments: Unable to inform CM last pcp office visit but able to state neuro rehab weekly visits, and a visit scheduled today at [facility] to see new Cardiologist Trevor Mace Rengel at 3 pm. Mark Sisemore or his grandson  provides transportation to appointments  Advance Directives: He does have a living will. Mark Holquin is the HPOA but they report they need to take forms back to the lawyer for review They both want to review the code status (presently full code) and want information on the MOST form  Consent: [facility] RN CM reviewed [facility] services with patient. Patient gave verbal consent for services.  Plan: [facility] RN CM will refer Mark Kidane to [facility] for advance directives, depression,  Caregiver/personal care services/house keeping  Kimberly L. Lavina Hamman, RN, BSN, CCM [facility] Telephonic Care Management Care Coordinator Direct number [phone]  [place] number [phone] Fax number [phone]

## 2017-09-22 ENCOUNTER — Ambulatory Visit: Payer: Medicare HMO | Admitting: Physical Therapy

## 2017-09-22 ENCOUNTER — Other Ambulatory Visit: Payer: Self-pay | Admitting: Licensed Clinical Social Worker

## 2017-09-22 NOTE — Patient Outreach (Signed)
[facility]) Care Management  09/22/2017  SUSIE POUSSON 05/26/1945 [id number]  Assessment- [facility] CSW received new referral on 09/22/17 for community resource education. Family is wanting information on advance directives, mental health resources and personal care resources. [facility] CSW completed initial call to patient's residence and was able to reach patient and spouse successfully and schedule initial home visit. HIPPA verifications successfully provided. [facility] CSW introduced self, reason for call and of [facility] social work services. [facility] CSW will complete home visit on 09/26/17 and will provide education and material.  Plan-[facility] CSW will complete home visit this week. [facility] CSW sent involvement letter to PCP.   Eula Fried, BSW, MSW, [email or web address] Phone: [phone] Fax: [phone]

## 2017-09-23 ENCOUNTER — Other Ambulatory Visit: Payer: Self-pay | Admitting: Licensed Clinical Social Worker

## 2017-09-23 NOTE — Patient Outreach (Signed)
[facility]) Care Management  09/23/2017  HAMID BROOKENS [birth year] [id number]  Assessment-CSW completed outreach attempt today after receiving message from [place] Management Assistant stating that patient's spouse called and canceled home visit for this week and is requesting a return call in order to reschedule home visit. [facility] CSW unable to reach patient or spouse when returning call and was unable to leave a voice message as mail box was full.   Plan-CSW will await return call or complete an additional within one week.  Eula Fried, BSW, MSW, [email or web address] Phone: [phone] Fax: [phone]

## 2017-09-25 ENCOUNTER — Inpatient Hospital Stay: Payer: Medicare HMO | Admitting: Hematology & Oncology

## 2017-09-25 ENCOUNTER — Inpatient Hospital Stay: Payer: Medicare HMO

## 2017-09-26 ENCOUNTER — Ambulatory Visit: Payer: Self-pay | Admitting: Licensed Clinical Social Worker

## 2017-09-29 ENCOUNTER — Encounter: Payer: Self-pay | Admitting: Physical Therapy

## 2017-09-29 ENCOUNTER — Ambulatory Visit: Payer: Medicare HMO | Attending: Hematology & Oncology | Admitting: Physical Therapy

## 2017-09-29 ENCOUNTER — Other Ambulatory Visit: Payer: Self-pay | Admitting: Licensed Clinical Social Worker

## 2017-09-29 DIAGNOSIS — R2681 Unsteadiness on feet: Secondary | ICD-10-CM | POA: Diagnosis not present

## 2017-09-29 DIAGNOSIS — R261 Paralytic gait: Secondary | ICD-10-CM | POA: Diagnosis not present

## 2017-09-29 DIAGNOSIS — R293 Abnormal posture: Secondary | ICD-10-CM | POA: Insufficient documentation

## 2017-09-29 DIAGNOSIS — M6281 Muscle weakness (generalized): Secondary | ICD-10-CM | POA: Insufficient documentation

## 2017-09-29 DIAGNOSIS — R2689 Other abnormalities of gait and mobility: Secondary | ICD-10-CM | POA: Diagnosis not present

## 2017-09-29 NOTE — Patient Outreach (Signed)
[facility]) Care Management  09/29/2017  Mark [name] Oct 09, 1945 [id number]  Assessment- [facility] CSW completed call to patient's residence and spoke with patient's spouse. HIPPA verifications received. [facility] CSW successfully rescheduled home visit for 10/07/17.  Plan-[facility] CSW will complete home visit next week and provide community resource handouts and assistance with completing advance directives.  Eula Fried, BSW, MSW, [email or web address] Phone: [phone] Fax: [phone]

## 2017-09-30 NOTE — Therapy (Signed)
[facility] [address] [place], [place], [address] Phone: [phone]   Fax:  [phone]  Physical Therapy Treatment  Patient Details  Name: Mark [name] MRN: [id number] Date of Birth: 04-11-1946 Referring Provider: Theone Murdoch, MD   Encounter Date: 09/29/2017  PT End of Session - 09/29/17 1648    Visit Number  8    Number of Visits  17    Date for PT Re-Evaluation  11/04/17    Authorization Type  AETNA Medicare $40 co pay Visit limit Medical Necessity    PT Start Time  1402    PT Stop Time  1445    PT Time Calculation (min)  43 min    Equipment Utilized During Treatment  Gait belt    Activity Tolerance  Patient tolerated treatment well;Patient limited by fatigue    Behavior During Therapy  [facility] for tasks assessed/performed       Past Medical History:  Diagnosis Date  . Chronic ITP (idiopathic thrombocytopenia) (HCC)   . Coronary artery disease 1992   MI  . DVT (deep venous thrombosis) ([place])   . 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 Hypertension   . Lupus anticoagulant disorder ([place])   . Multiple closed anterior-posterior compression fractures of pelvis ([place])   . Seizures ([place])   . Stroke [facility]) 02/27/2011   Left side weakness    Past Surgical History:  Procedure Laterality Date  . blood clot Right    surgical removal  . CHOLECYSTECTOMY    . CORONARY ANGIOPLASTY  1992  . SPLENECTOMY, TOTAL    . vertebralplasty      There were no vitals filed for this visit.  Subjective Assessment - 09/29/17 1404    Subjective  His grandson was in [place] so no transportation and wife in back brace so he has not been to [employer]. He did join [employer] so ready to go & plans to start this Wednesday.     Patient is accompained by:  Family member    Limitations  Standing;Walking;House hold activities    Patient Stated Goals  Update exercises especially right leg OA, would like ones that he can see progress., To walk with RW  better, safer.     Currently in Pain?  Yes    Pain Score  5     Pain Location  Hip    Pain Orientation  Right    Pain Descriptors / Indicators  Aching;Sharp    Pain Onset  More than a month ago    Pain Frequency  Constant    Aggravating Factors   arthritis, missed pain pill    Pain Relieving Factors  medications, heat, laying down    Pain Onset  More than a month ago    Pain Onset  More than a month ago                       [facility] Adult PT Treatment/Exercise - 09/29/17 1405      Transfers   Sit to Stand  5: Supervision;With upper extremity assist;With armrests;From chair/3-in-1 to RW, chair with & without armrests    Sit to Stand Details  Tactile cues for weight shifting;Visual cues/gestures for sequencing;Verbal cues for sequencing;Verbal cues for technique    Stand to Sit  5: Supervision;With upper extremity assist;With armrests;To chair/3-in-1 to RW, chair with & without armrests    Stand to Sit Details (indicate cue type and reason)  Visual cues/gestures for sequencing;Verbal  cues for sequencing;Verbal cues for safe use of DME/AE;Verbal cues for technique      Ambulation/Gait   Ambulation/Gait  Yes    Ambulation/Gait Assistance  4: Min guard    Ambulation/Gait Assistance Details  verbal & tactile cues on RW position to facilitate upright posture & improve clearance of LLE    Ambulation Distance (Feet)  100 Feet 100' X 2    Assistive device  Left platform walker;Other (Comment) AFO    Ambulation Surface  Indoor;Level    Ramp  4: Min assist platform RW    Ramp Details (indicate cue type and reason)  verbal & tactile cues on how to assist, upright posture, RW position.  Grandson verbalized understanding.    Curb  4: Min assist platfrom RW    Curb Details (indicate cue type and reason)  verbal & tactile cues on how to assist, upright posture, RW movement.  Grandson verbalized understanding.      Neuro Re-ed    Neuro Re-ed Details   standing platform RW support: MinA  for balance, LLE positioned neutral rotation, stepping RLE with external rotation, abduction and slight extension, reaching laterally with RUE to stretch Lt internal rotation.       Knee/Hip Exercises: Aerobic   Stepper  SciFit level 1 with BLEs & RUE for 5 minutes 2 sets with cues for full ROM.  During rest between sets & end of 2nd set, used machine to stretch / work LUE in controlled slow manner. 10 reps UE extension 10sec hold and flexion 10 sec hold.   PT instructed pt & grandson in set up & use at [place] - 09/29/17 1701      PT SHORT TERM GOAL #1   Title  Patient demonstrates understanding of OTAGO HEP. (All STGs Target Date 10/03/2017)    Baseline  MET    Time  4    Period  Weeks    Status  Achieved      PT SHORT TERM GOAL #2   Title  Patient ambulates 250' with platform RW & AFO with intermittent min guard.     Time  4    Period  Weeks    Status  On-going      PT SHORT TERM GOAL #3   Title  Patient negotiates ramp & curb with platform RW & AFO with min guard.     Baseline  Partially MET     Time  4    Period  Weeks    Status  Partially Met        PT Long Term Goals - 08/25/17 1804      PT LONG TERM GOAL #1   Title  Patient and wife demonstrate and verbalize understanding of ongoing fitness plan including [employer]. (All Target LTGs 10/31/2017)    Time  12    Status  On-going    Target Date  10/31/17      PT LONG TERM GOAL #2   Title  Patient ambulates 300' on paved surfaces with AFO & left platform RW with supervision.     Time  12    Period  Weeks    Status  On-going    Target Date  10/31/17      PT LONG TERM GOAL #3   Title  Patient negotiates ramps, curbs with platform RW and stairs (1 rail) with AFO with supervision.  Time  12    Period  Weeks    Status  On-going    Target Date  10/31/17      PT LONG TERM GOAL #4   Title  Patient perform standing balance with left platform on RW support reaching 5" anteriorly &  to knee level towards ground and manages pants for toileting modified independent.     Time  12    Period  Weeks    Status  On-going    Target Date  10/31/17      PT LONG TERM GOAL #5   Title  Patient ambulates 105' around furniture with left platform RW & AFO modified independent.     Time  12    Period  Weeks    Status  On-going    Target Date  10/31/17            Plan - 09/29/17 1702    Clinical Impression Statement  Patient met 2 of STGs. Grandson appears to understand how to assist on ramps & curbs for safety. Patient appears to undertand Standing exercise with grandson assist to stretch Lt hip internal rotators    Rehab Potential  Good    Clinical Impairments Affecting Rehab Potential  left hypertonicity & hemiplegia, right LE OA    PT Frequency  2x / week 2x/wk for 4 weeks, then 1x/wk for 8 weeks.    PT Duration  12 weeks    PT Treatment/Interventions  ADLs/Self Care Home Management;Moist Heat;DME Instruction;Gait training;Stair training;Functional mobility training;Therapeutic activities;Therapeutic exercise;Balance training;Neuromuscular re-education;Patient/family education;Orthotic Fit/Training;Vestibular    PT Next Visit Plan  check remaining STG & work towards [employer] and Agree with Plan of Care  Patient;Family member/caregiver    Family Member Consulted  grandson       Patient will benefit from skilled therapeutic intervention in order to improve the following deficits and impairments:  Abnormal gait, Decreased activity tolerance, Decreased balance, Decreased endurance, Decreased coordination, Decreased cognition, Decreased mobility, Decreased range of motion, Decreased safety awareness, Decreased strength, Dizziness, Impaired flexibility, Impaired tone, Impaired sensation, Impaired UE functional use, Postural dysfunction, Pain  Visit Diagnosis: Other abnormalities of gait and mobility  Unsteadiness on feet  Paralytic gait  Abnormal posture  Muscle  weakness (generalized)     Problem List Patient Active Problem List   Diagnosis Date Noted  . Pulmonary nodule 10/24/2016  . Acute hypoxemic respiratory failure ([place]) 10/21/2016  . Sepsis ([place]) 10/21/2016  . DVT (deep venous thrombosis) ([place]) 10/12/2016  . Supratherapeutic INR 10/12/2016  . Acute deep vein thrombosis (DVT) of popliteal vein of right lower extremity ([place]) 10/12/2016  . AKI (acute kidney injury) ([place]) 10/12/2016  . Fever 10/12/2016  . Leukocytosis 10/12/2016  . Bacteriuria 10/12/2016  . Deep vein thrombosis (DVT) ([place]) 03/02/2015  . Lupus anticoagulant disorder ([place]) 03/02/2015  . Ischemic stroke ([place]) 10/04/2014  . 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 Deep vein thrombosis ([place]) 10/04/2014  . 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 LA (lupus anticoagulant) disorder ([place]) 10/04/2014  . Arteriosclerosis of coronary artery 09/10/2012  . Apnea, sleep 09/10/2012  . 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 Dermatophytic onychia 01/29/2012  . Spastic hemiplegia ([place]) 12/03/2011  . Hemiparesis, left ([place]) 10/31/2011  . Dysphonia 08/13/2011  . Idiopathic thrombocytopenic purpura ([place]) 08/12/2011  . ITP (idiopathic thrombocytopenic purpura) 08/12/2011    Jamey Reas PT, DPT 09/30/2017, 7:05 AM  [facility] [address] [place], [place], [address] Phone: [phone]  Fax:  [phone]  Name: Mark [name] MRN: [id number] Date of Birth: [birth year]

## 2017-10-03 ENCOUNTER — Other Ambulatory Visit: Payer: Self-pay | Admitting: Licensed Clinical Social Worker

## 2017-10-03 NOTE — Patient Outreach (Signed)
[facility]) Care Management  10/03/2017  DAILEY BUCCHERI April 23, 1945 [id number]  Assessment- [facility] CSW completed outreach call and spoke with patient's spouse successfully. HIPPA verifications provided. [facility] CSW asked if [facility] CSW could reschedule home visit for next week and she is agreeable. Home visit canceled. Patient is agreeable to contact this [facility] CSW back after she looks at her calendar in order to reschedule home visit.  Plan-[facility] CSW will follow up within one week if no return call has been made.  Eula Fried, BSW, MSW, [email or web address] Phone: [phone] Fax: [phone]

## 2017-10-06 ENCOUNTER — Ambulatory Visit: Payer: Medicare HMO | Admitting: Physical Therapy

## 2017-10-06 ENCOUNTER — Encounter: Payer: Self-pay | Admitting: Physical Therapy

## 2017-10-06 DIAGNOSIS — R2681 Unsteadiness on feet: Secondary | ICD-10-CM | POA: Diagnosis not present

## 2017-10-06 DIAGNOSIS — R261 Paralytic gait: Secondary | ICD-10-CM

## 2017-10-06 DIAGNOSIS — R2689 Other abnormalities of gait and mobility: Secondary | ICD-10-CM | POA: Diagnosis not present

## 2017-10-06 DIAGNOSIS — M6281 Muscle weakness (generalized): Secondary | ICD-10-CM

## 2017-10-06 DIAGNOSIS — R293 Abnormal posture: Secondary | ICD-10-CM

## 2017-10-06 NOTE — Patient Instructions (Addendum)
Warrior II    Stand with Landscape architect. Make sure left foot is facing forward. Step right leg out to side and back. Your right knee can bend. Reach with right hand out to your right, Turn head right looking over your right hand.   Hold for __3-5__ breaths. Repeat 5 times.   http://yg.exer.us/16   Copyright  VHI. All rights reserved.

## 2017-10-07 ENCOUNTER — Ambulatory Visit: Payer: Self-pay | Admitting: Licensed Clinical Social Worker

## 2017-10-07 NOTE — Therapy (Signed)
[facility] [address] [place], [place], [address] Phone: [phone]   Fax:  [phone]  Physical Therapy Treatment  Patient Details  Name: Mark [name] MRN: [id number] Date of Birth: Jun 14, 1945 Referring Provider: Theone Murdoch, MD   Encounter Date: 10/06/2017  PT End of Session - 10/06/17 2207    Visit Number  9    Number of Visits  17    Date for PT Re-Evaluation  11/04/17    Authorization Type  AETNA Medicare $40 co pay Visit limit Medical Necessity    PT Start Time  1403    PT Stop Time  1445    PT Time Calculation (min)  42 min    Equipment Utilized During Treatment  Gait belt    Activity Tolerance  Patient tolerated treatment well    Behavior During Therapy  [facility] for tasks assessed/performed       Past Medical History:  Diagnosis Date  . Chronic ITP (idiopathic thrombocytopenia) (HCC)   . Coronary artery disease 1992   MI  . DVT (deep venous thrombosis) ([place])   . 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 Hypertension   . Lupus anticoagulant disorder ([place])   . Multiple closed anterior-posterior compression fractures of pelvis ([place])   . Seizures ([place])   . Stroke [facility]) 02/27/2011   Left side weakness    Past Surgical History:  Procedure Laterality Date  . blood clot Right    surgical removal  . CHOLECYSTECTOMY    . CORONARY ANGIOPLASTY  1992  . SPLENECTOMY, TOTAL    . vertebralplasty      There were no vitals filed for this visit.  Therapeutic Activities:  Standing with platform RW support, initially with PT providing assist & progressing to grandson assisting: Stepping RLE to right with external rotation, abduction & slight extension. Hold 20 seconds 6 reps.  Gait Training with platform RW with PT providing manual / tactile cues at pelvis for neutral rotation which decreased internal rotation. PT instructed grandson in how to provide cueing and return demo understanding.  Pt ambulated 260' with min guard.                              PT Short Term Goals - 10/06/17 2208      PT SHORT TERM GOAL #1   Title  Patient demonstrates understanding of OTAGO HEP. (All STGs Target Date 10/03/2017)    Baseline  MET    Time  4    Period  Weeks    Status  Achieved      PT SHORT TERM GOAL #2   Title  Patient ambulates 250' with platform RW & AFO with intermittent min guard.     Baseline  Met 10/06/2017    Time  4    Period  Weeks    Status  Achieved      PT SHORT TERM GOAL #3   Title  Patient negotiates ramp & curb with platform RW & AFO with min guard.     Baseline  Partially MET     Time  4    Period  Weeks    Status  Partially Met        PT Long Term Goals - 08/25/17 1804      PT LONG TERM GOAL #1   Title  Patient and wife demonstrate and verbalize understanding of ongoing fitness plan including [employer]. (All Target LTGs 10/31/2017)  Time  12    Status  On-going    Target Date  10/31/17      PT LONG TERM GOAL #2   Title  Patient ambulates 300' on paved surfaces with AFO & left platform RW with supervision.     Time  12    Period  Weeks    Status  On-going    Target Date  10/31/17      PT LONG TERM GOAL #3   Title  Patient negotiates ramps, curbs with platform RW and stairs (1 rail) with AFO with supervision.       Time  12    Period  Weeks    Status  On-going    [place] Date  10/31/17      PT LONG TERM GOAL #4   Title  Patient perform standing balance with left platform on RW support reaching 5" anteriorly & to knee level towards ground and manages pants for toileting modified independent.     Time  12    Period  Weeks    Status  On-going    [place] Date  10/31/17      PT LONG TERM GOAL #5   Title  Patient ambulates 82' around furniture with left platform RW & AFO modified independent.     Time  12    Period  Weeks    Status  On-going    Target Date  10/31/17            Plan - 10/06/17 2209    Clinical Impression Statement  Patient's  grandson appears to understand how to provide tactile cues at pelvis to facilitate neutral rotation which decreased amount of LLE internal rotation.  Patient met remaining STG of gait. He also appears to understand modified Warrior II to right to stretch LLE & LUE.     Rehab Potential  Good    Clinical Impairments Affecting Rehab Potential  left hypertonicity & hemiplegia, right LE OA    PT Frequency  2x / week 2x/wk for 4 weeks, then 1x/wk for 8 weeks.    PT Duration  12 weeks    PT Treatment/Interventions  ADLs/Self Care Home Management;Moist Heat;DME Instruction;Gait training;Stair training;Functional mobility training;Therapeutic activities;Therapeutic exercise;Balance training;Neuromuscular re-education;Patient/family education;Orthotic Fit/Training;Vestibular    PT Next Visit Plan  progress note to MD next visit (#10) work towards [employer] and Agree with Plan of Care  Patient;Family member/caregiver    Family Member Consulted  grandson       Patient will benefit from skilled therapeutic intervention in order to improve the following deficits and impairments:  Abnormal gait, Decreased activity tolerance, Decreased balance, Decreased endurance, Decreased coordination, Decreased cognition, Decreased mobility, Decreased range of motion, Decreased safety awareness, Decreased strength, Dizziness, Impaired flexibility, Impaired tone, Impaired sensation, Impaired UE functional use, Postural dysfunction, Pain  Visit Diagnosis: Other abnormalities of gait and mobility  Unsteadiness on feet  Paralytic gait  Abnormal posture  Muscle weakness (generalized)     Problem List Patient Active Problem List   Diagnosis Date Noted  . Pulmonary nodule 10/24/2016  . Acute hypoxemic respiratory failure ([place]) 10/21/2016  . Sepsis ([place]) 10/21/2016  . DVT (deep venous thrombosis) ([place]) 10/12/2016  . Supratherapeutic INR 10/12/2016  . Acute deep vein thrombosis (DVT) of popliteal vein of right  lower extremity ([place]) 10/12/2016  . AKI (acute kidney injury) ([place]) 10/12/2016  . Fever 10/12/2016  . Leukocytosis 10/12/2016  . Bacteriuria 10/12/2016  . Deep vein thrombosis (DVT) ([place]) 03/02/2015  .  Lupus anticoagulant disorder ([place]) 03/02/2015  . Ischemic stroke ([place]) 10/04/2014  . 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 Deep vein thrombosis ([place]) 10/04/2014  . 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 LA (lupus anticoagulant) disorder ([place]) 10/04/2014  . Arteriosclerosis of coronary artery 09/10/2012  . Apnea, sleep 09/10/2012  . 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 Dermatophytic onychia 01/29/2012  . Spastic hemiplegia ([place]) 12/03/2011  . Hemiparesis, left ([place]) 10/31/2011  . Dysphonia 08/13/2011  . Idiopathic thrombocytopenic purpura ([place]) 08/12/2011  . ITP (idiopathic thrombocytopenic purpura) 08/12/2011    Jamey Reas PT, DPT 10/07/2017, 10:24 PM  [facility] [address] [place], [place], [address] Phone: [phone]   Fax:  [phone]  Name: DALEN HENNESSEE MRN: [id number] Date of Birth: 10-24-1945

## 2017-10-08 ENCOUNTER — Inpatient Hospital Stay: Payer: Medicare HMO | Attending: Hematology & Oncology

## 2017-10-08 ENCOUNTER — Inpatient Hospital Stay (HOSPITAL_BASED_OUTPATIENT_CLINIC_OR_DEPARTMENT_OTHER): Payer: Medicare HMO | Admitting: Hematology & Oncology

## 2017-10-08 ENCOUNTER — Encounter: Payer: Self-pay | Admitting: Hematology & Oncology

## 2017-10-08 ENCOUNTER — Other Ambulatory Visit: Payer: Self-pay

## 2017-10-08 VITALS — BP 151/77 | HR 77 | Temp 98.4°F | Resp 20 | Wt 197.0 lb

## 2017-10-08 DIAGNOSIS — Z79899 Other long term (current) drug therapy: Secondary | ICD-10-CM

## 2017-10-08 DIAGNOSIS — D6862 Lupus anticoagulant syndrome: Secondary | ICD-10-CM | POA: Insufficient documentation

## 2017-10-08 DIAGNOSIS — I69354 Hemiplegia and hemiparesis following cerebral infarction affecting left non-dominant side: Secondary | ICD-10-CM | POA: Insufficient documentation

## 2017-10-08 DIAGNOSIS — Z7901 Long term (current) use of anticoagulants: Secondary | ICD-10-CM | POA: Insufficient documentation

## 2017-10-08 DIAGNOSIS — I639 Cerebral infarction, unspecified: Secondary | ICD-10-CM

## 2017-10-08 DIAGNOSIS — D693 Immune thrombocytopenic purpura: Secondary | ICD-10-CM

## 2017-10-08 DIAGNOSIS — Z7982 Long term (current) use of aspirin: Secondary | ICD-10-CM | POA: Insufficient documentation

## 2017-10-08 LAB — CBC WITH DIFFERENTIAL (CANCER CENTER ONLY)
BASOS ABS: 0 10*3/uL (ref 0.0–0.1)
BASOS PCT: 0 %
EOS ABS: 0.4 10*3/uL (ref 0.0–0.5)
EOS PCT: 5 %
HCT: 42.8 % (ref 38.7–49.9)
HEMOGLOBIN: 14.4 g/dL (ref 13.0–17.1)
LYMPHS ABS: 3.1 10*3/uL (ref 0.9–3.3)
Lymphocytes Relative: 36 %
MCH: 30.3 pg (ref 28.0–33.4)
MCHC: 33.6 g/dL (ref 32.0–35.9)
MCV: 90.1 fL (ref 82.0–98.0)
Monocytes Absolute: 1.7 10*3/uL — ABNORMAL HIGH (ref 0.1–0.9)
Monocytes Relative: 20 %
NEUTROS PCT: 39 %
Neutro Abs: 3.5 10*3/uL (ref 1.5–6.5)
PLATELETS: 182 10*3/uL (ref 145–400)
RBC: 4.75 MIL/uL (ref 4.20–5.70)
RDW: 14.4 % (ref 11.1–15.7)
WBC: 8.7 10*3/uL (ref 4.0–10.0)

## 2017-10-08 LAB — CMP (CANCER CENTER ONLY)
ALBUMIN: 4 g/dL (ref 3.5–5.0)
ALK PHOS: 70 U/L (ref 26–84)
ALT: 29 U/L (ref 10–47)
AST: 28 U/L (ref 11–38)
Anion gap: 8 (ref 5–15)
BUN: 19 mg/dL (ref 7–22)
CHLORIDE: 103 mmol/L (ref 98–108)
CO2: 30 mmol/L (ref 18–33)
Calcium: 9.8 mg/dL (ref 8.0–10.3)
Creatinine: 1.4 mg/dL — ABNORMAL HIGH (ref 0.60–1.20)
GLUCOSE: 95 mg/dL (ref 73–118)
POTASSIUM: 4.5 mmol/L (ref 3.3–4.7)
SODIUM: 141 mmol/L (ref 128–145)
TOTAL PROTEIN: 8 g/dL (ref 6.4–8.1)
Total Bilirubin: 0.6 mg/dL (ref 0.2–1.6)

## 2017-10-08 NOTE — Progress Notes (Signed)
Hematology and Oncology Follow Up Visit  Mark [name] [id number] 12/15/1945 72 y.o. 10/08/2017   Principle Diagnosis:  1. Refractory immune thrombocytopenia. 2. 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 3. Positive lupus anticoagulant.  Current Therapy:   Xarelto 20 mg PO daily   Interim History:  Mark [name] is here today with his grandson for follow-up.  He actually looks quite good.  This is probably the best that I have seen him look in quite a while.  He is doing physical therapy.  He is getting physical therapy 3 times a week.  He and his wife will be going to stroke camp in the [place] in September.  He has been there before.  I think this is a great idea for him.  He has had no bleeding.  He is on Xarelto.  He has had no cough or shortness of breath.  He has had no fever.  He has had no nausea or vomiting.  He still has a hard time with weakness in the left arm.  His appetite is quite good.  He has had no change in bowel or bladder habits.  Any pain that he has is treated with tramadol and some Tylenol.  This seems to help quite a bit.  Overall, his performance status is ECOG 2.    Medications:  Allergies as of 10/08/2017   No Known Allergies     Medication List        Accurate as of 10/08/17  3:53 PM. Always use your most recent med list.          acetaminophen 500 MG tablet Commonly known as:  TYLENOL Take 1 tablet (500 mg total) by mouth every 6 (six) hours as needed (pain).   alendronate 70 MG tablet Commonly known as:  FOSAMAX Take 70 mg by mouth every Sunday. Take with a full glass of water on an empty stomach.   amLODipine 5 MG tablet Commonly known as:  NORVASC daily.   aspirin 81 MG chewable tablet Chew 1 tablet (81 mg total) by mouth at bedtime.   bethanechol 25 MG tablet Commonly known as:  URECHOLINE 2 (two) times daily.   buPROPion 150 MG 24 hr tablet Commonly known as:  WELLBUTRIN  XL Take 150 mg by mouth daily.   CRESTOR 20 MG tablet Generic drug:  rosuvastatin Take 20 mg by mouth at bedtime.   dexamethasone 4 MG tablet Commonly known as:  DECADRON As needed for ITP   famotidine 40 MG tablet Commonly known as:  PEPCID Take 40 mg by mouth 2 (two) times daily.   fluticasone 50 MCG/ACT nasal spray Commonly known as:  FLONASE Place 2 sprays into both nostrils daily as needed for allergies or rhinitis.   HYDROcodone-acetaminophen 5-325 MG tablet Commonly known as:  NORCO/VICODIN Take 1 tablet by mouth every 6 (six) hours as needed for moderate pain.   levETIRAcetam 500 MG tablet Commonly known as:  KEPPRA Take 1,500 mg by mouth 2 (two) times daily. 3 tabs  in am and 3 tabs in pm   Melatonin 1 MG Tabs Take by mouth at bedtime as needed for sleep.   multivitamin with minerals tablet Take 1 tablet by mouth 2 (two) times daily.   nystatin powder Generic drug:  nystatin   polyethylene glycol packet Commonly known as:  MIRALAX / GLYCOLAX Take 17 g by mouth daily as needed.   sertraline 100 MG tablet Commonly known as:  ZOLOFT Take 200 mg by  mouth daily.   tamsulosin 0.4 MG Caps capsule Commonly known as:  FLOMAX Take 0.8 mg by mouth at bedtime.   traMADol 50 MG tablet Commonly known as:  ULTRAM Take 1 tablet (50 mg total) by mouth every 6 (six) hours as needed (pain).   Vitamin D3 1000 units Caps Take 2,000 Units by mouth 2 (two) times daily after a meal.   XARELTO 20 MG Tabs tablet Generic drug:  rivaroxaban TAKE 1 TABLET BY MOUTH ONCE DAILY WITH  BREAKFAST       Allergies: No Known Allergies  Past Medical History, Surgical history, Social history, and Family History were reviewed and updated.  Review of Systems: Review of Systems  HENT: Negative.   Eyes: Negative.   Respiratory: Negative.   Cardiovascular: Negative.   Gastrointestinal: Negative.   Genitourinary: Negative.   Musculoskeletal: Positive for joint pain.  Skin:  Negative.   Neurological: Positive for focal weakness.  Endo/Heme/Allergies: Negative.   Psychiatric/Behavioral: Negative.      Physical Exam:  weight is 197 lb (89.4 kg). His oral temperature is 98.4 F (36.9 C). His blood pressure is 151/77 (abnormal) and his pulse is 77. His respiration is 20 and oxygen saturation is 96%.   Wt Readings from Last 3 Encounters:  10/08/17 197 lb (89.4 kg)  09/11/17 196 lb (88.9 kg)  07/02/17 199 lb (90.3 kg)    Physical Exam  Constitutional: He is oriented to person, place, and time.  HENT:  Head: Normocephalic and atraumatic.  Mouth/Throat: Oropharynx is clear and moist.  Eyes: Pupils are equal, round, and reactive to light. EOM are normal.  Neck: Normal range of motion.  Cardiovascular: Normal rate, regular rhythm and normal heart sounds.  Pulmonary/Chest: Effort normal and breath sounds normal.  Abdominal: Soft. Bowel sounds are normal.  Musculoskeletal: Normal range of motion. He exhibits no edema, tenderness or deformity.  Lymphadenopathy:    He has no cervical adenopathy.  Neurological: He is alert and oriented to person, place, and time.  Skin: Skin is warm and dry. No rash noted. No erythema.  Psychiatric: He has a normal mood and affect. His behavior is normal. Judgment and thought content normal.  Vitals reviewed.   Lab Results  Component Value Date   WBC 8.7 10/08/2017   HGB 14.4 10/08/2017   HCT 42.8 10/08/2017   MCV 90.1 10/08/2017   PLT 182 10/08/2017   Lab Results  Component Value Date   FERRITIN 1,063 (H) 10/23/2016   IRON 27 (L) 10/23/2016   TIBC 160 (L) 10/23/2016   UIBC 133 10/23/2016   IRONPCTSAT 17 (L) 10/23/2016   Lab Results  Component Value Date   RETICCTPCT 1.5 08/20/2010   RBC 4.75 10/08/2017   RETICCTABS 75.3 08/20/2010   No results found for: KPAFRELGTCHN, LAMBDASER, KAPLAMBRATIO No results found for: IGGSERUM, IGA, IGMSERUM No results found for: Odetta Pink, SPEI   Chemistry      Component Value Date/Time   NA 141 10/08/2017 1502   NA 146 (H) 04/09/2017 1405   NA 138 05/09/2016 1321   K 4.5 10/08/2017 1502   K 4.0 04/09/2017 1405   K 4.3 05/09/2016 1321   CL 103 10/08/2017 1502   CL 108 04/09/2017 1405   CO2 30 10/08/2017 1502   CO2 23 04/09/2017 1405   CO2 22 05/09/2016 1321   BUN 19 10/08/2017 1502   BUN 34 (H) 04/09/2017 1405   BUN 36.4 (H) 05/09/2016 1321   CREATININE  1.40 (H) 10/08/2017 1502   CREATININE 1.3 (H) 04/09/2017 1405   CREATININE 1.5 (H) 05/09/2016 1321      Component Value Date/Time   CALCIUM 9.8 10/08/2017 1502   CALCIUM 9.1 04/09/2017 1405   CALCIUM 9.9 05/09/2016 1321   ALKPHOS 70 10/08/2017 1502   ALKPHOS 61 04/09/2017 1405   ALKPHOS 103 05/09/2016 1321   AST 28 10/08/2017 1502   AST 39 (H) 05/09/2016 1321   ALT 29 10/08/2017 1502   ALT 57 (H) 04/09/2017 1405   ALT 64 (H) 05/09/2016 1321   BILITOT 0.6 10/08/2017 1502   BILITOT 0.49 05/09/2016 1321      Impression and Plan: Mark [name] is a very pleasant 72 yo caucasian gentleman with refractory immune thrombocytopenia. 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 He is now on Xarelto and doing well.   I am so happy for him.  He looks quite good.  As always, he is trying his best.  He is so motivated.    I think we can get him back in 3 months.  I would like to get him back after he goes to his stroke camp.   Volanda Napoleon, MD 6/26/20193:53 PM

## 2017-10-10 ENCOUNTER — Other Ambulatory Visit: Payer: Self-pay | Admitting: Licensed Clinical Social Worker

## 2017-10-10 NOTE — Patient Outreach (Signed)
  [place] [facility]) Care Management  10/10/2017  Mark [name] [birth year] [id number]  Assessment-CSW completed outreach attempt today. CSW unable to reach patient successfully. CSW left a HIPPA compliant voice message encouraging patient to return call once available in order to reschedule home visit.  Plan-CSW will await return call or complete an additional outreach if needed.  Eula Fried, BSW, MSW, [email or web address] Phone: [phone] Fax: [phone]

## 2017-10-13 ENCOUNTER — Ambulatory Visit: Payer: Medicare HMO | Admitting: Physical Therapy

## 2017-10-13 ENCOUNTER — Other Ambulatory Visit: Payer: Self-pay | Admitting: Licensed Clinical Social Worker

## 2017-10-13 NOTE — Patient Outreach (Signed)
[facility] [facility]) Care Management  10/13/2017  Mark [name] 05-22-1945 [id number]  CSW completed outreach attempt today. CSW unable to reach patient successfully. CSW left a HIPPA compliant voice message encouraging patient to return call once available. [facility] CSW will mail unsuccessful outreach letter to patient at this time and will complete third and final outreach call within two weeks.  Eula Fried, BSW, MSW, [name] Phone: [phone] Fax: [phone]

## 2017-10-17 ENCOUNTER — Other Ambulatory Visit: Payer: Self-pay | Admitting: Licensed Clinical Social Worker

## 2017-10-17 NOTE — Patient Outreach (Signed)
[facility]) Care Management  10/17/2017  KENNETH CUARESMA 23-May-1945 [id number]  Assessment-CSW completed third and final outreach attempt today. CSW unable to reach patient successfully. CSW left a HIPPA compliant voice message encouraging patient to return call once available if still interested in completing advance directives. Unsuccessful outreach letter already mailed.   Plan-CSW will close case at this time as [facility] CSW has been unable to maintain contact with family.  Eula Fried, BSW, MSW, [email or web address] Phone: [phone] Fax: [phone]

## 2017-10-20 ENCOUNTER — Ambulatory Visit: Payer: Medicare HMO | Admitting: Physical Therapy

## 2017-10-23 ENCOUNTER — Other Ambulatory Visit: Payer: Self-pay

## 2017-10-23 NOTE — Patient Outreach (Signed)
[place] [facility]) Care Management  10/23/2017  ALIN CHAVIRA 21-Jul-1945 [id number]   BSW spoke with patient's spouse, Jw Covin, regarding social work referral.  Per Mrs. Schertzer, she is the primary caregiver for Mr. Pintor.  She reported that her grandson comes to assist three days out of the week for several hours at a time.  BSW educated Mrs. Fallin about options for in-home assistance.  BSW talked with her about what is required in order for Medicare to pay for these services.  She reported that Mr. Monestime has had the same physical therapist for a very long time and would likely not be willing to switch to another agency. She reported that he is also not comfortable with strangers in the home so this would be challenging.  BSW talked with her about [facility] and informed her that this is only for patient's with Medicaid.  Mrs. Bruso said she feels it is very unlikely that he would qualify for Medicaid but she said that he can't hurt to try.   BSW and Mrs. Georg had a brief discussion about Advanced Directives and she requested that a packet be sent.   BSW mailed Medicaid application as well as Advanced Directives EMMI and packet. BSW will follow up next week to ensure receipt.     Ronn Melena, BSW Social Worker [phone]

## 2017-10-27 ENCOUNTER — Ambulatory Visit: Payer: Medicare HMO | Attending: Hematology & Oncology | Admitting: Physical Therapy

## 2017-10-27 ENCOUNTER — Encounter: Payer: Self-pay | Admitting: Physical Therapy

## 2017-10-27 DIAGNOSIS — R2681 Unsteadiness on feet: Secondary | ICD-10-CM | POA: Insufficient documentation

## 2017-10-27 DIAGNOSIS — R42 Dizziness and giddiness: Secondary | ICD-10-CM | POA: Diagnosis not present

## 2017-10-27 DIAGNOSIS — R2689 Other abnormalities of gait and mobility: Secondary | ICD-10-CM | POA: Insufficient documentation

## 2017-10-27 DIAGNOSIS — M25551 Pain in right hip: Secondary | ICD-10-CM | POA: Insufficient documentation

## 2017-10-27 DIAGNOSIS — R293 Abnormal posture: Secondary | ICD-10-CM | POA: Diagnosis not present

## 2017-10-27 DIAGNOSIS — R261 Paralytic gait: Secondary | ICD-10-CM | POA: Insufficient documentation

## 2017-10-27 DIAGNOSIS — M6281 Muscle weakness (generalized): Secondary | ICD-10-CM | POA: Insufficient documentation

## 2017-10-27 DIAGNOSIS — R296 Repeated falls: Secondary | ICD-10-CM | POA: Diagnosis not present

## 2017-10-28 NOTE — Therapy (Signed)
[facility] [address] [place], [place], [address] Phone: [phone]   Fax:  [phone]  Physical Therapy Treatment  Patient Details  Name: Mark [name] MRN: [id number] Date of Birth: 09-07-1945 Referring Provider: Theone Murdoch, MD   Encounter Date: 10/27/2017  PT End of Session - 10/27/17 1640    Visit Number  10    Number of Visits  17    Date for PT Re-Evaluation  11/04/17    Authorization Type  [employer] Medicare $40 co pay Visit limit Medical Necessity    PT Start Time  5003    PT Stop Time  1530    PT Time Calculation (min)  45 min    Equipment Utilized During Treatment  Gait belt    Activity Tolerance  Patient tolerated treatment well    Behavior During Therapy  [facility] for tasks assessed/performed       Past Medical History:  Diagnosis Date  . Chronic ITP (idiopathic thrombocytopenia) (HCC)   . Coronary artery disease 1992   MI  . DVT (deep venous thrombosis) ([place])   . 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 Hypertension   . Lupus anticoagulant disorder ([place])   . Multiple closed anterior-posterior compression fractures of pelvis ([place])   . Seizures ([place])   . Stroke [facility]) 02/27/2011   Left side weakness    Past Surgical History:  Procedure Laterality Date  . blood clot Right    surgical removal  . CHOLECYSTECTOMY    . CORONARY ANGIOPLASTY  1992  . SPLENECTOMY, TOTAL    . vertebralplasty      There were no vitals filed for this visit.  Subjective Assessment - 10/27/17 1444    Subjective  He fell one time going into the bathroom. He was transitioning from platform RW (will not fit in bathroom) to hemiwalker and misjudged location of devices. He was sore on right hip, shoulder & back but better in 3-4 days. He reports inconsistent with exercises.     Patient is accompained by:  Family member    Limitations  Standing;Walking;House hold activities    Patient Stated Goals  Update exercises especially right leg OA,  would like ones that he can see progress., To walk with RW better, safer.     Currently in Pain?  Yes    Pain Score  5  in last week, ranging from 1 to 8/10    Pain Location  Hip    Pain Orientation  Right    Pain Descriptors / Indicators  Aching;Sharp    Pain Type  Chronic pain    Pain Onset  More than a month ago    Pain Frequency  Constant    Aggravating Factors   arthritis, missed pain pills    Pain Relieving Factors  medications, heat, laying down    Effect of Pain on Daily Activities  limits walking    Multiple Pain Sites  Yes    Pain Score  0    Pain Location  Foot    Pain Orientation  Right    Pain Descriptors / Indicators  Burning    Pain Onset  More than a month ago    Pain Score  5 in last week, ranging 1 to8/10    Pain Location  Back    Pain Orientation  Right;Lower    Pain Descriptors / Indicators  Aching    Pain Type  Chronic pain    Pain Onset  More than a  month ago    Pain Frequency  Constant    Aggravating Factors   standing & walking    Pain Relieving Factors  laying down, medications, heat pad                      [facility] Adult PT Treatment/Exercise - 10/27/17 1445      Transfers   Sit to Stand  6: Modified independent (Device/Increase time);5: Supervision;With upper extremity assist;With armrests;From chair/3-in-1 to RW    Sit to Stand Details  --    Stand to Sit  6: Modified independent (Device/Increase time);5: Supervision;With upper extremity assist;With armrests;To chair/3-in-1 from RW    Stand to Sit Details (indicate cue type and reason)  --      Ambulation/Gait   Ambulation/Gait  Yes    Ambulation/Gait Assistance  5: Supervision    Ambulation/Gait Assistance Details  Patient ambulated in simulated home (short distances around furniture) safely without assistance. He ambulated limited distance community based activities with supervisin / tactile cues at pelvis to facilitate control of rotation. Dtr verbalized understanding of pelvic  tactile cues.      Ambulation Distance (Feet)  200 Feet 200' outdoor & 67' indoor simulation    Assistive device  Left platform walker;Other (Comment) AFO    Ambulation Surface  Indoor;Level;Outdoor;Paved    Gait velocity  1.77 ft/sec    Ramp  4: Min assist platform RW    Ramp Details (indicate cue type and reason)  PT instructed dtr how to assist while pt performed    Curb  4: Min assist platfrom RW    Curb Details (indicate cue type and reason)  PT instructed dtr how to assist while pt performed      Balance   Balance Assessed  Yes      Dynamic Standing Balance   Dynamic Standing - Balance Support  Left upper extremity supported on platform RW    Dynamic Standing - Level of Assistance  5: Stand by assistance    Reaching for objects comments:  reaches 5" anteriorly & to knee level with supervision. Pt demonstrates simulation for pants management & verbalizes toileting without assistance.       Neuro Re-ed    Neuro Re-ed Details   --      Knee/Hip Exercises: Aerobic   Stepper  --         Patient & dtr described bathroom layout. Apparently platform RW will not fit into bathroom area so he uses hemiwalker. Most recent fall apparently per pt report happened as distance reaching from one walker to other was too great. They have resolved that issue.  He ambulates with platform RW to doorway then transfers to [employer]. He walks through sink area that has counter /sink running along left side and wall along right side. Then 2nd doorway into area with toilet & tub. Both doors have been removed. PT recommended placing horizontal rail on right wall and using rail & counter in sink area and hemiwalker in toilet/tub area. Dtr verbalized understanding.     PT Education - 10/27/17 1500    Education provided  Yes    Education Details  PT, pt & dtr discussed layout of bathroom (see PT note) with PT recommendation for bar on wall of sink room.  Ongoing HEP including [employer] for recumbent stepper ~2  x/wk    Person(s) Educated  Patient;Child(ren)    Methods  Explanation;Verbal cues    Comprehension  Verbalized understanding  PT Short Term Goals - 10/06/17 2208      PT SHORT TERM GOAL #1   Title  Patient demonstrates understanding of OTAG[employer] HEP. (All STGs Target Date 10/03/2017)    Baseline  MET    Time  4    Period  Weeks    Status  Achieved      PT SHORT TERM GOAL #2   Title  Patient ambulates 250' with platform RW & AFO with intermittent min guard.     Baseline  Met 10/06/2017    Time  4    Period  Weeks    Status  Achieved      PT SHORT TERM GOAL #3   Title  Patient negotiates ramp & curb with platform RW & AFO with min guard.     Baseline  Partially MET     Time  4    Period  Weeks    Status  Partially Met        PT Long Term Goals - 10/28/17 1228      PT LONG TERM GOAL #1   Title  Patient and wife demonstrate and verbalize understanding of ongoing fitness plan including [employer]. (All [employer] LTGs 10/31/2017)    Baseline  MET 10/27/2017    Time  12    Status  Achieved      PT LONG TERM GOAL #2   Title  Patient ambulates 300' on paved surfaces with AFO & left platform RW with supervision.     Baseline  Partially MET 10/27/2017 pt ambulates 200' on paved surfaces with AFO & left platform with supervision.     Time  12    Period  Weeks    Status  Partially Met      PT LONG TERM GOAL #3   Title  Patient negotiates ramps, curbs with platform RW and stairs (1 rail) with AFO with minA.       Baseline  MET at revised minA level. Goal was revised as weight of RW unsafe without assistance.     Time  12    Period  Weeks    Status  Achieved      PT LONG TERM GOAL #4   Title  Patient perform standing balance with left platform on RW support reaching 5" anteriorly & to knee level towards ground and manages pants for toileting with supervision.    Baseline  MET 10/27/17 with revised level supervision    Time  12    Period  Weeks    Status  Partially Met      PT LONG  TERM GOAL #5   Title  Patient ambulates 71' around furniture with left platform RW & AFO modified independent.     Time  12    Period  Weeks    Status  Partially Met            Plan - 10/27/17 2234    Clinical Impression Statement  Patient met or partially met LTGs revised level. He improved function with platform RW and OA pain levels verbalized at lower overall level. Patient needs supervision / minA for gait acitivites for safety including cognitive deficits.     Rehab Potential  Good    Clinical Impairments Affecting Rehab Potential  left hypertonicity & hemiplegia, right LE OA    PT Frequency  2x / week 2x/wk for 4 weeks, then 1x/wk for 8 weeks.    PT Duration  12 weeks    PT Treatment/Interventions  ADLs/Self Care Home Management;Moist Heat;DME Instruction;Gait training;Stair training;Functional mobility training;Therapeutic activities;Therapeutic exercise;Balance training;Neuromuscular re-education;Patient/family education;Orthotic Fit/Training;Vestibular    PT Next Visit Plan  discharge PT    Consulted and Agree with Plan of Care  Patient;Family member/caregiver    Family Member Consulted  dtr       Patient will benefit from skilled therapeutic intervention in order to improve the following deficits and impairments:  Abnormal gait, Decreased activity tolerance, Decreased balance, Decreased endurance, Decreased coordination, Decreased cognition, Decreased mobility, Decreased range of motion, Decreased safety awareness, Decreased strength, Dizziness, Impaired flexibility, Impaired tone, Impaired sensation, Impaired UE functional use, Postural dysfunction, Pain  Visit Diagnosis: Other abnormalities of gait and mobility  Unsteadiness on feet  Paralytic gait  Abnormal posture  Muscle weakness (generalized)  Repeated falls  Pain in right hip  Dizziness and giddiness     Problem List Patient Active Problem List   Diagnosis Date Noted  . Pulmonary nodule 10/24/2016   . Acute hypoxemic respiratory failure ([place]) 10/21/2016  . Sepsis ([place]) 10/21/2016  . DVT (deep venous thrombosis) ([place]) 10/12/2016  . Supratherapeutic INR 10/12/2016  . Acute deep vein thrombosis (DVT) of popliteal vein of right lower extremity ([place]) 10/12/2016  . AKI (acute kidney injury) ([place]) 10/12/2016  . Fever 10/12/2016  . Leukocytosis 10/12/2016  . Bacteriuria 10/12/2016  . Deep vein thrombosis (DVT) ([place]) 03/02/2015  . Lupus anticoagulant disorder ([place]) 03/02/2015  . Ischemic stroke ([place]) 10/04/2014  . 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 Deep vein thrombosis ([place]) 10/04/2014  . Immune thrombocytopenic purpura ([place]) 10/04/2014  . LA (lupus anticoagulant) disorder ([place]) 10/04/2014  . Arteriosclerosis of coronary artery 09/10/2012  . Apnea, sleep 09/10/2012  . 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 Dermatophytic onychia 01/29/2012  . Spastic hemiplegia ([place]) 12/03/2011  . Hemiparesis, left ([place]) 10/31/2011  . Dysphonia 08/13/2011  . Idiopathic thrombocytopenic purpura ([place]) 08/12/2011  . ITP (idiopathic thrombocytopenic purpura) 08/12/2011   PHYSICAL THERAPY DISCHARGE SUMMARY  Visits from Start of Care: 10  Current functional level related to goals / functional outcomes: See above.   Remaining deficits: Patient has severe left hemiplegia with hypertonicity. He has OA pain in hip & low back but reports lower levels and limits mobility less than at PT evaluation.    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   Plan: Patient agrees to discharge.  Patient goals were partially met. Patient is being discharged due to meeting the stated rehab goals.  ?????         Mark [name] PT, DPT 10/28/2017, 12:37 PM  [facility] [address] [place], [place], [address] Phone: [phone]   Fax:  [phone]  Name: Mark [name] MRN: [id number] Date of Birth: 1945/10/24

## 2017-10-30 ENCOUNTER — Ambulatory Visit: Payer: Self-pay

## 2017-11-04 ENCOUNTER — Other Ambulatory Visit: Payer: Self-pay

## 2017-11-04 NOTE — Patient Outreach (Signed)
[place] [facility]) Care Management  11/04/2017  Mark [name] 07-Oct-1945 [id number]   Follow up call to patient's spouse to ensure receipt of Medicaid application and Advance Directives.  She reported that these documents were received but they do not intend to apply for Medicaid due to it being unlikely they would qualify.  She said they are working on the process of consolidating debt to assist with their finances.  [name] denied having any questions about Advance Directives at this time but agreed to call BSW if questions arise.   BSW is closing case at this time due to no other social work needs being identified.    Ronn Melena, BSW Social Worker [phone]

## 2017-11-19 DIAGNOSIS — N183 Chronic kidney disease, stage 3 (moderate): Secondary | ICD-10-CM | POA: Diagnosis not present

## 2017-11-19 DIAGNOSIS — R760 Raised antibody titer: Secondary | ICD-10-CM | POA: Diagnosis not present

## 2017-11-19 DIAGNOSIS — Z8673 Personal history of transient ischemic attack (TIA), and cerebral infarction without residual deficits: Secondary | ICD-10-CM | POA: Diagnosis not present

## 2017-11-19 DIAGNOSIS — I129 Hypertensive chronic kidney disease with stage 1 through stage 4 chronic kidney disease, or unspecified chronic kidney disease: Secondary | ICD-10-CM | POA: Diagnosis not present

## 2017-11-19 DIAGNOSIS — D693 Immune thrombocytopenic purpura: Secondary | ICD-10-CM | POA: Diagnosis not present

## 2017-11-25 DIAGNOSIS — G4733 Obstructive sleep apnea (adult) (pediatric): Secondary | ICD-10-CM | POA: Diagnosis not present

## 2017-12-01 DIAGNOSIS — M1611 Unilateral primary osteoarthritis, right hip: Secondary | ICD-10-CM | POA: Diagnosis not present

## 2017-12-12 DIAGNOSIS — I252 Old myocardial infarction: Secondary | ICD-10-CM | POA: Diagnosis not present

## 2017-12-12 DIAGNOSIS — I69354 Hemiplegia and hemiparesis following cerebral infarction affecting left non-dominant side: Secondary | ICD-10-CM | POA: Diagnosis not present

## 2017-12-12 DIAGNOSIS — I6521 Occlusion and stenosis of right carotid artery: Secondary | ICD-10-CM | POA: Diagnosis not present

## 2017-12-12 DIAGNOSIS — G4733 Obstructive sleep apnea (adult) (pediatric): Secondary | ICD-10-CM | POA: Diagnosis not present

## 2017-12-12 DIAGNOSIS — D6862 Lupus anticoagulant syndrome: Secondary | ICD-10-CM | POA: Diagnosis not present

## 2017-12-12 DIAGNOSIS — I70208 Unspecified atherosclerosis of native arteries of extremities, other extremity: Secondary | ICD-10-CM | POA: Diagnosis not present

## 2017-12-12 DIAGNOSIS — D693 Immune thrombocytopenic purpura: Secondary | ICD-10-CM | POA: Diagnosis not present

## 2017-12-12 DIAGNOSIS — N183 Chronic kidney disease, stage 3 (moderate): Secondary | ICD-10-CM | POA: Diagnosis not present

## 2017-12-12 DIAGNOSIS — R760 Raised antibody titer: Secondary | ICD-10-CM | POA: Diagnosis not present

## 2017-12-12 DIAGNOSIS — I998 Other disorder of circulatory system: Secondary | ICD-10-CM | POA: Diagnosis not present

## 2017-12-12 DIAGNOSIS — I2510 Atherosclerotic heart disease of native coronary artery without angina pectoris: Secondary | ICD-10-CM | POA: Diagnosis not present

## 2017-12-26 DIAGNOSIS — G4733 Obstructive sleep apnea (adult) (pediatric): Secondary | ICD-10-CM | POA: Diagnosis not present

## 2018-01-09 DIAGNOSIS — K219 Gastro-esophageal reflux disease without esophagitis: Secondary | ICD-10-CM | POA: Diagnosis not present

## 2018-01-09 DIAGNOSIS — R1312 Dysphagia, oropharyngeal phase: Secondary | ICD-10-CM | POA: Diagnosis not present

## 2018-01-20 DIAGNOSIS — M62838 Other muscle spasm: Secondary | ICD-10-CM | POA: Diagnosis not present

## 2018-01-20 DIAGNOSIS — R69 Illness, unspecified: Secondary | ICD-10-CM | POA: Diagnosis not present

## 2018-01-20 DIAGNOSIS — Z7901 Long term (current) use of anticoagulants: Secondary | ICD-10-CM | POA: Diagnosis not present

## 2018-01-20 DIAGNOSIS — G8110 Spastic hemiplegia affecting unspecified side: Secondary | ICD-10-CM | POA: Diagnosis not present

## 2018-01-20 DIAGNOSIS — R1310 Dysphagia, unspecified: Secondary | ICD-10-CM | POA: Diagnosis not present

## 2018-01-20 DIAGNOSIS — R296 Repeated falls: Secondary | ICD-10-CM | POA: Diagnosis not present

## 2018-01-20 DIAGNOSIS — N183 Chronic kidney disease, stage 3 (moderate): Secondary | ICD-10-CM | POA: Diagnosis not present

## 2018-01-20 DIAGNOSIS — Z8673 Personal history of transient ischemic attack (TIA), and cerebral infarction without residual deficits: Secondary | ICD-10-CM | POA: Diagnosis not present

## 2018-01-20 DIAGNOSIS — D6861 Antiphospholipid syndrome: Secondary | ICD-10-CM | POA: Diagnosis not present

## 2018-01-25 DIAGNOSIS — G4733 Obstructive sleep apnea (adult) (pediatric): Secondary | ICD-10-CM | POA: Diagnosis not present

## 2018-01-27 DIAGNOSIS — Z23 Encounter for immunization: Secondary | ICD-10-CM | POA: Diagnosis not present

## 2018-02-25 DIAGNOSIS — G4733 Obstructive sleep apnea (adult) (pediatric): Secondary | ICD-10-CM | POA: Diagnosis not present

## 2018-03-06 ENCOUNTER — Ambulatory Visit: Payer: Medicare HMO | Admitting: Hematology & Oncology

## 2018-03-06 ENCOUNTER — Other Ambulatory Visit: Payer: Medicare HMO

## 2018-03-16 ENCOUNTER — Other Ambulatory Visit: Payer: Self-pay | Admitting: Hematology & Oncology

## 2018-03-16 DIAGNOSIS — M810 Age-related osteoporosis without current pathological fracture: Secondary | ICD-10-CM | POA: Diagnosis not present

## 2018-03-16 DIAGNOSIS — D6862 Lupus anticoagulant syndrome: Secondary | ICD-10-CM

## 2018-03-16 DIAGNOSIS — R69 Illness, unspecified: Secondary | ICD-10-CM | POA: Diagnosis not present

## 2018-03-16 DIAGNOSIS — E785 Hyperlipidemia, unspecified: Secondary | ICD-10-CM | POA: Diagnosis not present

## 2018-03-16 DIAGNOSIS — I129 Hypertensive chronic kidney disease with stage 1 through stage 4 chronic kidney disease, or unspecified chronic kidney disease: Secondary | ICD-10-CM | POA: Diagnosis not present

## 2018-03-16 DIAGNOSIS — E559 Vitamin D deficiency, unspecified: Secondary | ICD-10-CM | POA: Diagnosis not present

## 2018-03-16 DIAGNOSIS — I69359 Hemiplegia and hemiparesis following cerebral infarction affecting unspecified side: Secondary | ICD-10-CM | POA: Diagnosis not present

## 2018-03-16 DIAGNOSIS — N183 Chronic kidney disease, stage 3 (moderate): Secondary | ICD-10-CM | POA: Diagnosis not present

## 2018-03-16 DIAGNOSIS — G40909 Epilepsy, unspecified, not intractable, without status epilepticus: Secondary | ICD-10-CM | POA: Diagnosis not present

## 2018-03-16 DIAGNOSIS — I824Y9 Acute embolism and thrombosis of unspecified deep veins of unspecified proximal lower extremity: Secondary | ICD-10-CM

## 2018-03-16 DIAGNOSIS — Z1211 Encounter for screening for malignant neoplasm of colon: Secondary | ICD-10-CM | POA: Diagnosis not present

## 2018-03-16 DIAGNOSIS — R946 Abnormal results of thyroid function studies: Secondary | ICD-10-CM | POA: Diagnosis not present

## 2018-03-17 DIAGNOSIS — R946 Abnormal results of thyroid function studies: Secondary | ICD-10-CM | POA: Diagnosis not present

## 2018-03-23 DIAGNOSIS — Z1211 Encounter for screening for malignant neoplasm of colon: Secondary | ICD-10-CM | POA: Diagnosis not present

## 2018-03-23 DIAGNOSIS — I129 Hypertensive chronic kidney disease with stage 1 through stage 4 chronic kidney disease, or unspecified chronic kidney disease: Secondary | ICD-10-CM | POA: Diagnosis not present

## 2018-03-26 IMAGING — CT CT HEAD W/O CM
3 series · 15 of 47 positions shown, 18 images · non-contrast
Comparison: Head CT scan 10/21/2016.  Brain MRI 09/01/2015.

CLINICAL DATA: Status post fall today with a blow to the right side
of the head.

EXAM:
CT HEAD WITHOUT CONTRAST
TECHNIQUE: Contiguous axial images were obtained from the base of the skull
through the vertex without intravenous contrast.

[Series 2: head wo · axial · 0.43mm/px · z∈[+1166,+1302]mm · 9 of 33 slices shown, 12 images]
[im 3/33  brain]
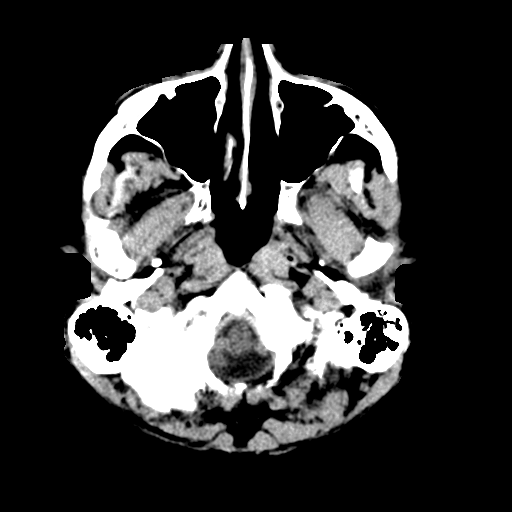
[im 3/33  bone]
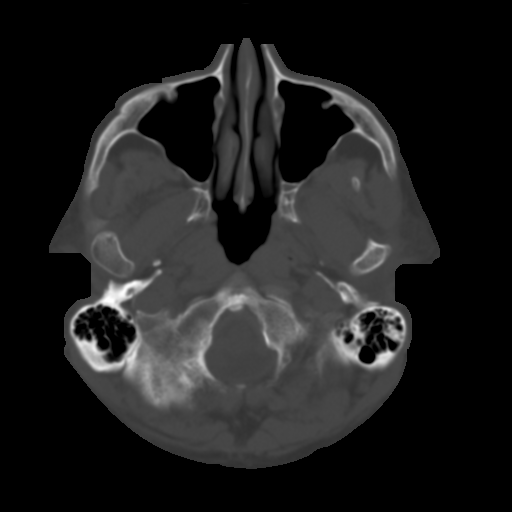
[im 6/33  brain]
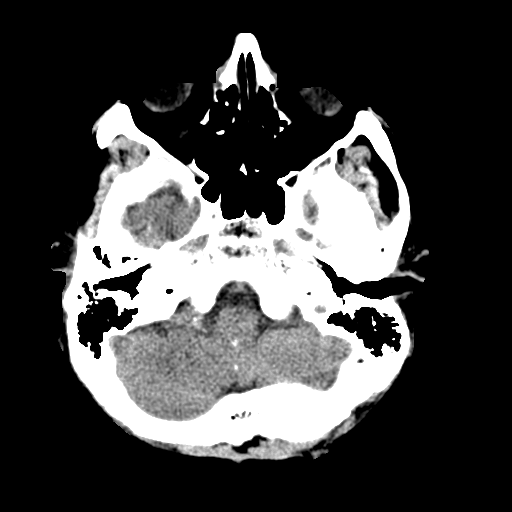
[im 9/33  brain]
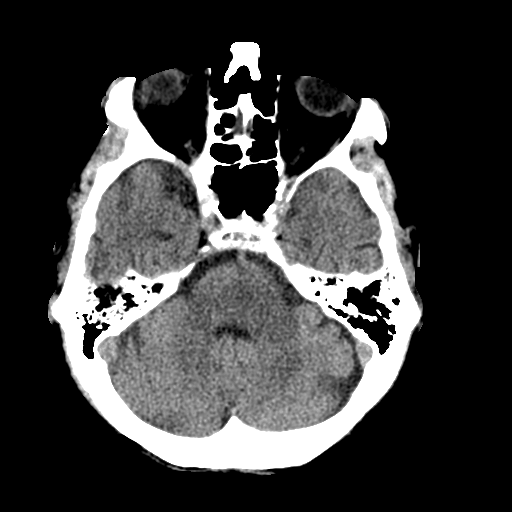
[im 13/33  brain]
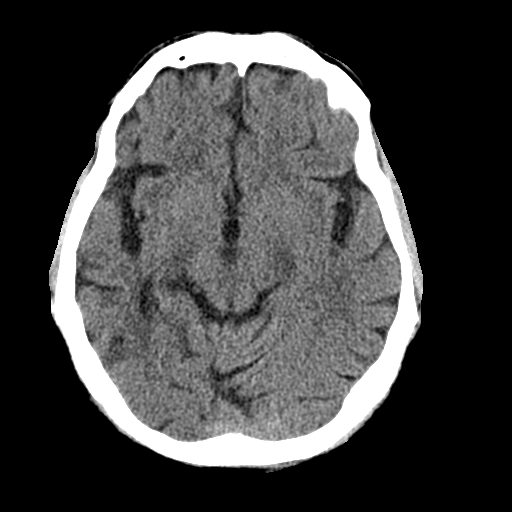
[im 17/33  brain]
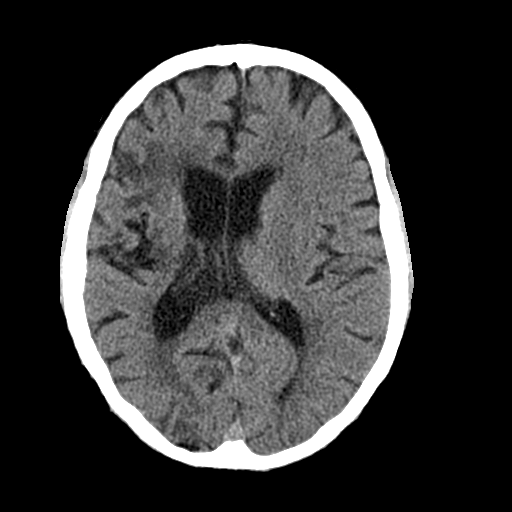
[im 17/33  bone]
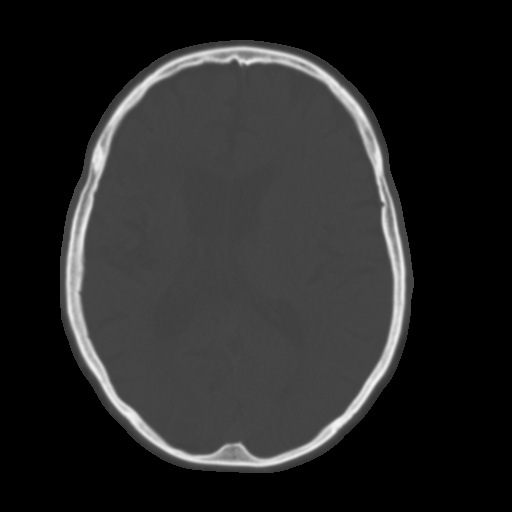
[im 20/33  brain]
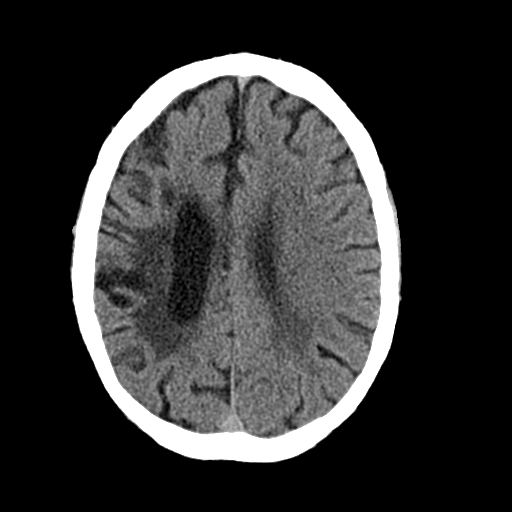
[im 24/33  brain]
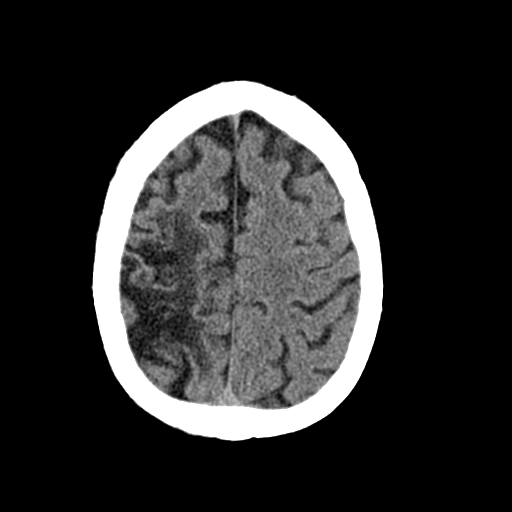
[im 27/33  brain]
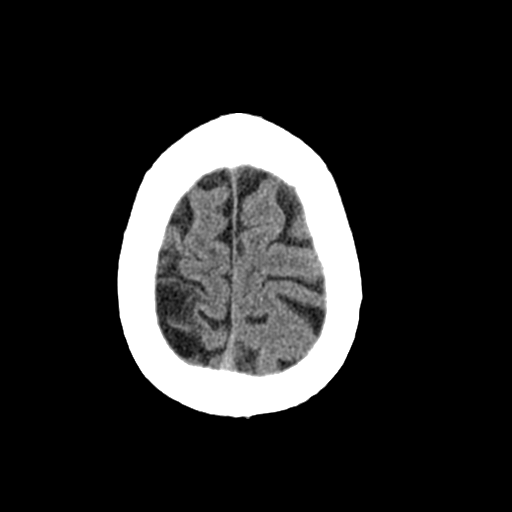
[im 30/33  brain]
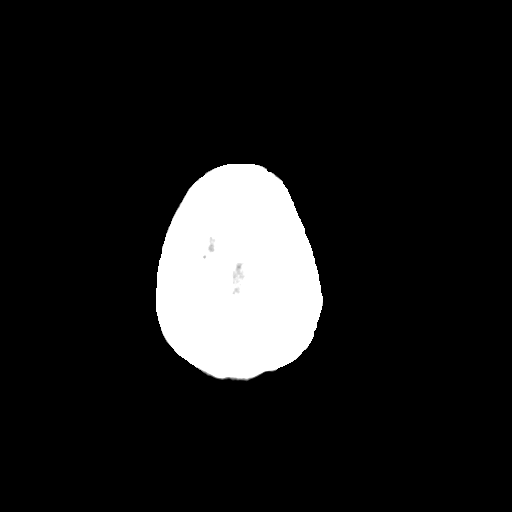
[im 30/33  bone]
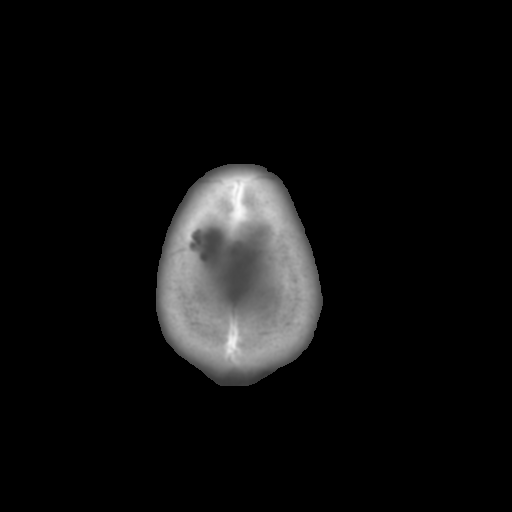

[Series 4: cor soft · coronal · 0.32mm/px · 3 of 74 slices shown]
[im 25/74  brain]
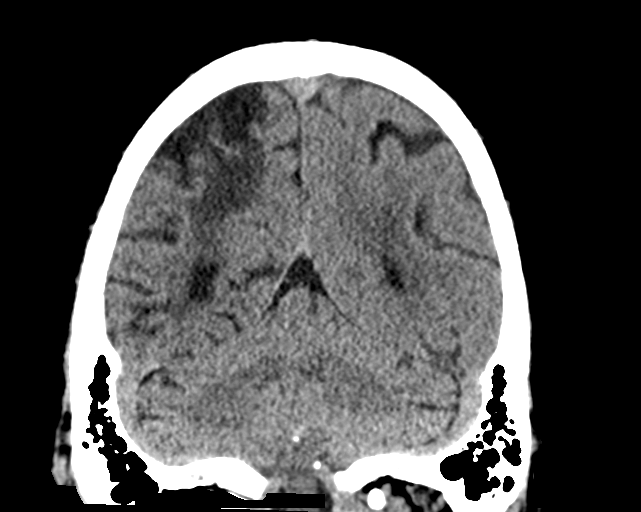
[im 33/74  brain]
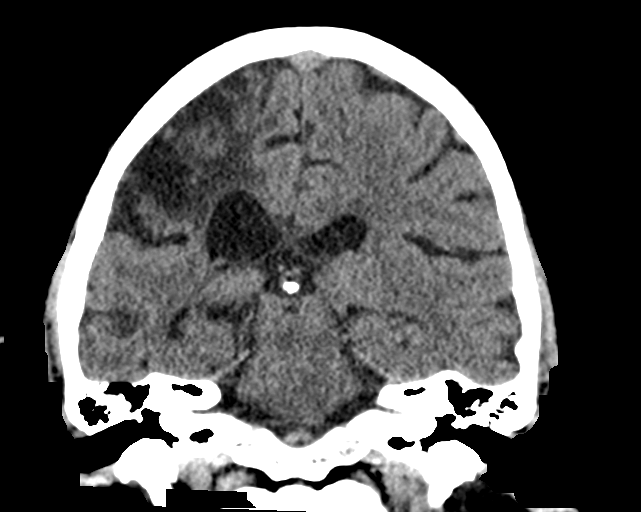
[im 41/74  brain]
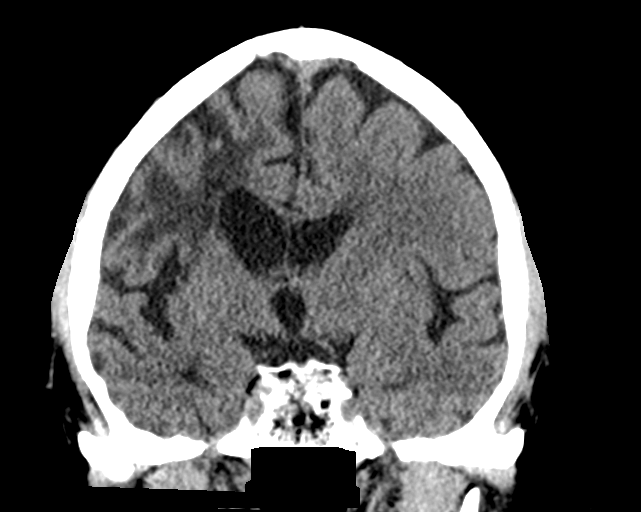

[Series 5: sag soft · sagittal · 0.32mm/px · 3 of 69 slices shown]
[im 23/69  brain]
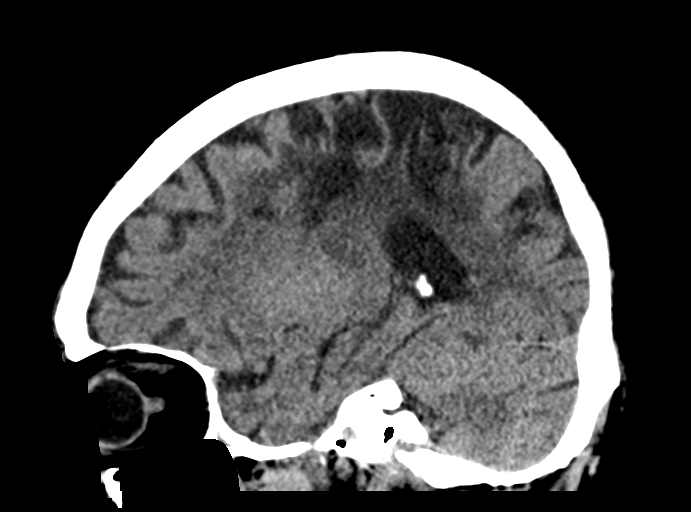
[im 35/69  brain]
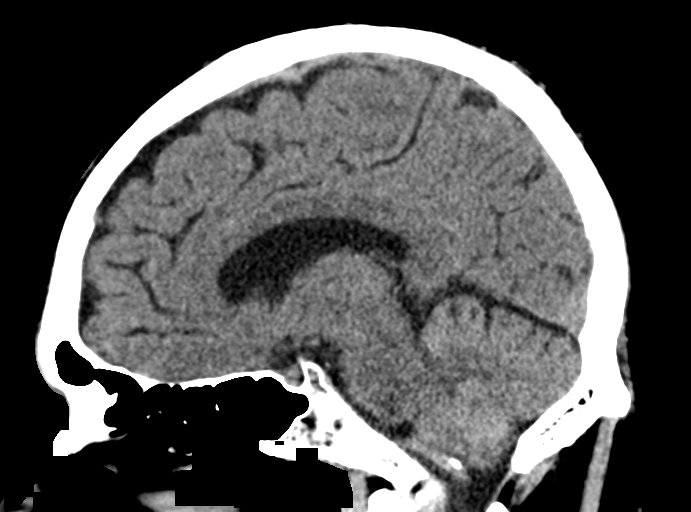
[im 46/69  brain]
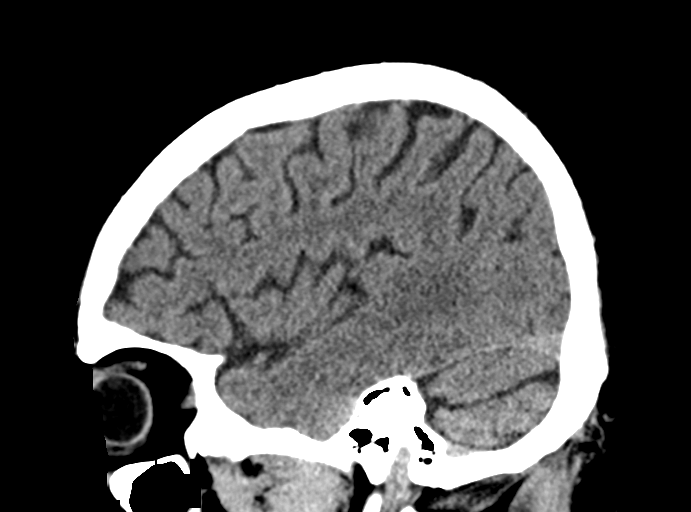

[15 of 47 positions shown; findings below may reference images not displayed]

FINDINGS: Brain: No evidence of acute infarction, hemorrhage, hydrocephalus,
extra-axial collection or mass lesion/mass effect. Remote right MCA
infarct is noted.

Vascular: No hyperdense vessel or unexpected calcification.

Skull: Intact.

Sinuses/Orbits: Negative.

Other: None.
IMPRESSION: No acute abnormality.

Remote right MCA infarct.

## 2018-03-27 ENCOUNTER — Inpatient Hospital Stay: Payer: Medicare HMO | Admitting: Family

## 2018-03-27 ENCOUNTER — Inpatient Hospital Stay: Payer: Medicare HMO | Attending: Hematology & Oncology

## 2018-03-27 DIAGNOSIS — G4733 Obstructive sleep apnea (adult) (pediatric): Secondary | ICD-10-CM | POA: Diagnosis not present

## 2018-03-30 DIAGNOSIS — H10413 Chronic giant papillary conjunctivitis, bilateral: Secondary | ICD-10-CM | POA: Diagnosis not present

## 2018-03-30 DIAGNOSIS — Z961 Presence of intraocular lens: Secondary | ICD-10-CM | POA: Diagnosis not present

## 2018-03-30 DIAGNOSIS — H04123 Dry eye syndrome of bilateral lacrimal glands: Secondary | ICD-10-CM | POA: Diagnosis not present

## 2018-03-30 DIAGNOSIS — H01021 Squamous blepharitis right upper eyelid: Secondary | ICD-10-CM | POA: Diagnosis not present

## 2018-03-30 DIAGNOSIS — H01025 Squamous blepharitis left lower eyelid: Secondary | ICD-10-CM | POA: Diagnosis not present

## 2018-03-30 DIAGNOSIS — H01022 Squamous blepharitis right lower eyelid: Secondary | ICD-10-CM | POA: Diagnosis not present

## 2018-03-30 DIAGNOSIS — H01024 Squamous blepharitis left upper eyelid: Secondary | ICD-10-CM | POA: Diagnosis not present

## 2018-04-02 DIAGNOSIS — M1611 Unilateral primary osteoarthritis, right hip: Secondary | ICD-10-CM | POA: Diagnosis not present

## 2018-04-02 DIAGNOSIS — G894 Chronic pain syndrome: Secondary | ICD-10-CM | POA: Diagnosis not present

## 2018-04-27 DIAGNOSIS — G4733 Obstructive sleep apnea (adult) (pediatric): Secondary | ICD-10-CM | POA: Diagnosis not present

## 2018-04-29 ENCOUNTER — Inpatient Hospital Stay (HOSPITAL_BASED_OUTPATIENT_CLINIC_OR_DEPARTMENT_OTHER): Payer: Medicare HMO | Admitting: Family

## 2018-04-29 ENCOUNTER — Other Ambulatory Visit: Payer: Self-pay

## 2018-04-29 ENCOUNTER — Inpatient Hospital Stay: Payer: Medicare HMO | Attending: Hematology & Oncology

## 2018-04-29 ENCOUNTER — Encounter: Payer: Self-pay | Admitting: Family

## 2018-04-29 VITALS — BP 109/66 | HR 76 | Temp 98.3°F | Wt 192.0 lb

## 2018-04-29 DIAGNOSIS — I82401 Acute embolism and thrombosis of unspecified deep veins of right lower extremity: Secondary | ICD-10-CM | POA: Diagnosis not present

## 2018-04-29 DIAGNOSIS — I69354 Hemiplegia and hemiparesis following cerebral infarction affecting left non-dominant side: Secondary | ICD-10-CM | POA: Diagnosis not present

## 2018-04-29 DIAGNOSIS — Z7901 Long term (current) use of anticoagulants: Secondary | ICD-10-CM | POA: Insufficient documentation

## 2018-04-29 DIAGNOSIS — D6862 Lupus anticoagulant syndrome: Secondary | ICD-10-CM | POA: Diagnosis not present

## 2018-04-29 DIAGNOSIS — D693 Immune thrombocytopenic purpura: Secondary | ICD-10-CM | POA: Insufficient documentation

## 2018-04-29 DIAGNOSIS — G629 Polyneuropathy, unspecified: Secondary | ICD-10-CM | POA: Insufficient documentation

## 2018-04-29 DIAGNOSIS — I639 Cerebral infarction, unspecified: Secondary | ICD-10-CM

## 2018-04-29 LAB — CMP (CANCER CENTER ONLY)
ALT: 21 U/L (ref 0–44)
AST: 19 U/L (ref 15–41)
Albumin: 4.7 g/dL (ref 3.5–5.0)
Alkaline Phosphatase: 56 U/L (ref 38–126)
Anion gap: 9 (ref 5–15)
BUN: 26 mg/dL — AB (ref 8–23)
CALCIUM: 9.2 mg/dL (ref 8.9–10.3)
CO2: 25 mmol/L (ref 22–32)
CREATININE: 1.35 mg/dL — AB (ref 0.61–1.24)
Chloride: 105 mmol/L (ref 98–111)
GFR, Est AFR Am: >60 mL/min (ref >60)
GFR, Estimated: 52 mL/min — ABNORMAL LOW (ref >60)
Glucose, Bld: 96 mg/dL (ref 70–99)
Potassium: 3.8 mmol/L (ref 3.5–5.1)
Sodium: 139 mmol/L (ref 135–145)
Total Bilirubin: 0.5 mg/dL (ref 0.3–1.2)
Total Protein: 7.3 g/dL (ref 6.5–8.1)

## 2018-04-29 LAB — CBC WITH DIFFERENTIAL (CANCER CENTER ONLY)
ABS IMMATURE GRANULOCYTES: 0.05 10*3/uL (ref 0.00–0.07)
BASOS PCT: 1 %
Basophils Absolute: 0.1 10*3/uL (ref 0.0–0.1)
EOS ABS: 0.6 10*3/uL — AB (ref 0.0–0.5)
EOS PCT: 8 %
HCT: 40.4 % (ref 39.0–52.0)
Hemoglobin: 13.4 g/dL (ref 13.0–17.0)
Immature Granulocytes: 1 %
Lymphocytes Relative: 36 %
Lymphs Abs: 2.8 10*3/uL (ref 0.7–4.0)
MCH: 30.3 pg (ref 26.0–34.0)
MCHC: 33.2 g/dL (ref 30.0–36.0)
MCV: 91.4 fL (ref 80.0–100.0)
MONOS PCT: 18 %
Monocytes Absolute: 1.4 10*3/uL — ABNORMAL HIGH (ref 0.1–1.0)
Neutro Abs: 2.8 10*3/uL (ref 1.7–7.7)
Neutrophils Relative %: 36 %
PLATELETS: 241 10*3/uL (ref 150–400)
RBC: 4.42 MIL/uL (ref 4.22–5.81)
RDW: 14.3 % (ref 11.5–15.5)
WBC Count: 7.7 10*3/uL (ref 4.0–10.5)
nRBC: 0 % (ref 0.0–0.2)

## 2018-04-29 NOTE — Progress Notes (Signed)
Hematology and Oncology Follow Up Visit  Mark [name] [id number] 03/26/1946 73 y.o. 04/29/2018   Principle Diagnosis:  1. Refractory immune thrombocytopenia 2. 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 Positive lupus anticoagulant  Current Therapy:   Xarelto 20 mg PO daily   Interim History:  Mark [name] is here today with his sweet wife for follow-up. He is doing well but still has fatigue and weakness at times.  He is grieving the unexpected loss of his younger brother. This has been especially hard for him and he plans to start writing letters to his family.  He has also started drawing and hope to play his guitars again one day. He really is quite talented.  He will be starting PT again soon for strengthening. He has residual weakness on the left side since his stroke.  He states that the neuropathy in his feet is unchanged.  His platelet count is stable at 241, Hgb 13.4 and WBC 7.7.  He is doing well on Xarelto and has only had a few episodes where he had blood on his tissue when blowing his nose. No other bleeding, no new bruising or petechiae.  No fever, chills, n/v, cough rash, dizziness, SOB, chest pain, palpitations, abdominal pain or changes in bowel or bladder habits.  No swelling in his extremities at this time.  No lymphadenopathy noted on exam.  He has maintained a good appetite and is staying well hydrated. His was unable to stand for a weight today.   ECOG Performance Status: 1 - Symptomatic but completely ambulatory  Medications:  Allergies as of 04/29/2018   No Known Allergies     Medication List       Accurate as of April 29, 2018  3:01 PM. Always use your most recent med list.        acetaminophen 500 MG tablet Commonly known as:  TYLENOL Take 1 tablet (500 mg total) by mouth every 6 (six) hours as needed (pain).   alendronate 70 MG tablet Commonly known as:  FOSAMAX Take 70 mg by mouth every Sunday. Take with a full  glass of water on an empty stomach.   amLODipine 5 MG tablet Commonly known as:  NORVASC daily.   aspirin 81 MG chewable tablet Chew 1 tablet (81 mg total) by mouth at bedtime.   bethanechol 25 MG tablet Commonly known as:  URECHOLINE 2 (two) times daily.   buPROPion 150 MG 24 hr tablet Commonly known as:  WELLBUTRIN XL Take 150 mg by mouth daily.   CRESTOR 20 MG tablet Generic drug:  rosuvastatin Take 20 mg by mouth at bedtime.   dexamethasone 4 MG tablet Commonly known as:  DECADRON As needed for ITP   famotidine 40 MG tablet Commonly known as:  PEPCID Take 40 mg by mouth 2 (two) times daily.   fluticasone 50 MCG/ACT nasal spray Commonly known as:  FLONASE Place 2 sprays into both nostrils daily as needed for allergies or rhinitis.   HYDROcodone-acetaminophen 5-325 MG tablet Commonly known as:  NORCO/VICODIN Take 1 tablet by mouth every 6 (six) hours as needed for moderate pain.   levETIRAcetam 500 MG tablet Commonly known as:  KEPPRA Take 1,500 mg by mouth 2 (two) times daily. 3 tabs  in am and 3 tabs in pm   Melatonin 1 MG Tabs Take by mouth at bedtime as needed for sleep.   multivitamin with minerals tablet Take 1 tablet by mouth 2 (two) times daily.   nystatin powder Generic  drug:  nystatin   polyethylene glycol packet Commonly known as:  MIRALAX / GLYCOLAX Take 17 g by mouth daily as needed.   sertraline 100 MG tablet Commonly known as:  ZOLOFT Take 200 mg by mouth daily.   tamsulosin 0.4 MG Caps capsule Commonly known as:  FLOMAX Take 0.8 mg by mouth at bedtime.   traMADol 50 MG tablet Commonly known as:  ULTRAM Take 1 tablet (50 mg total) by mouth every 6 (six) hours as needed (pain).   Vitamin D3 25 MCG (1000 UT) Caps Take 2,000 Units by mouth 2 (two) times daily after a meal.   XARELTO 20 MG Tabs tablet Generic drug:  rivaroxaban TAKE 1 TABLET BY MOUTH ONCE DAILY WITH BREAKFAST       Allergies: No Known Allergies  Past Medical  History, Surgical history, Social history, and Family History were reviewed and updated.  Review of Systems: All other 10 point review of systems is negative.   Physical Exam:  vitals were not taken for this visit.   Wt Readings from Last 3 Encounters:  10/08/17 197 lb (89.4 kg)  09/11/17 196 lb (88.9 kg)  07/02/17 199 lb (90.3 kg)    Ocular: Sclerae unicteric, pupils equal, round and reactive to light Ear-nose-throat: Oropharynx clear, dentition fair Lymphatic: No cervical, supraclavicular or axillary adenopathy Lungs no rales or rhonchi, good excursion bilaterally Heart regular rate and rhythm, no murmur appreciated Abd soft, nontender, positive bowel sounds, no liver or spleen tip palpated on exam, no fluid wave  MSK no focal spinal tenderness, no joint edema Neuro: non-focal, well-oriented, appropriate affect Breasts: Deferred   Lab Results  Component Value Date   WBC 8.7 10/08/2017   HGB 14.4 10/08/2017   HCT 42.8 10/08/2017   MCV 90.1 10/08/2017   PLT 182 10/08/2017   Lab Results  Component Value Date   FERRITIN 1,063 (H) 10/23/2016   IRON 27 (L) 10/23/2016   TIBC 160 (L) 10/23/2016   UIBC 133 10/23/2016   IRONPCTSAT 17 (L) 10/23/2016   Lab Results  Component Value Date   RETICCTPCT 1.5 08/20/2010   RBC 4.75 10/08/2017   RETICCTABS 75.3 08/20/2010   No results found for: KPAFRELGTCHN, LAMBDASER, KAPLAMBRATIO No results found for: IGGSERUM, IGA, IGMSERUM No results found for: Odetta Pink, SPEI   Chemistry      Component Value Date/Time   NA 141 10/08/2017 1502   NA 146 (H) 04/09/2017 1405   NA 138 05/09/2016 1321   K 4.5 10/08/2017 1502   K 4.0 04/09/2017 1405   K 4.3 05/09/2016 1321   CL 103 10/08/2017 1502   CL 108 04/09/2017 1405   CO2 30 10/08/2017 1502   CO2 23 04/09/2017 1405   CO2 22 05/09/2016 1321   BUN 19 10/08/2017 1502   BUN 34 (H) 04/09/2017 1405   BUN 36.4 (H) 05/09/2016 1321    CREATININE 1.40 (H) 10/08/2017 1502   CREATININE 1.3 (H) 04/09/2017 1405   CREATININE 1.5 (H) 05/09/2016 1321      Component Value Date/Time   CALCIUM 9.8 10/08/2017 1502   CALCIUM 9.1 04/09/2017 1405   CALCIUM 9.9 05/09/2016 1321   ALKPHOS 70 10/08/2017 1502   ALKPHOS 61 04/09/2017 1405   ALKPHOS 103 05/09/2016 1321   AST 28 10/08/2017 1502   AST 39 (H) 05/09/2016 1321   ALT 29 10/08/2017 1502   ALT 57 (H) 04/09/2017 1405   ALT 64 (H) 05/09/2016 1321   BILITOT 0.6  10/08/2017 1502   BILITOT 0.49 05/09/2016 1321       Impression and Plan: Mark [name] is a very pleasant 73 yo caucasian gentleman with refractory immune thrombocytopenia with history of thrombotic CVA and residual left sided weakness.  We have not seen him in almost 10 months and he is still doing quite well. No complaints at this time.  His platelet count is stable at 241, WBC 7.7 and Hgb 13.4.  He continues to tolerate Xarelto nicely.  We will plan to see her back in another 2 months for follow-up.  They will contact our office with any questions or concerns. We can certainly see him sooner if need be.   Laverna Peace, NP 1/15/20203:01 PM

## 2018-05-01 LAB — HEXAGONAL PHASE PHOSPHOLIPID: Hexagonal Phase Phospholipid: 41 s — ABNORMAL HIGH (ref 0–11)

## 2018-05-01 LAB — LUPUS ANTICOAGULANT PANEL
DRVVT: 166.3 s — AB (ref 0.0–47.0)
PTT Lupus Anticoagulant: 102.8 s — ABNORMAL HIGH (ref 0.0–51.9)

## 2018-05-01 LAB — DRVVT MIX: dRVVT Mix: 132.5 s — ABNORMAL HIGH (ref 0.0–47.0)

## 2018-05-01 LAB — DRVVT CONFIRM: DRVVT CONFIRM: 1.3 ratio — AB (ref 0.8–1.2)

## 2018-05-01 LAB — PTT-LA MIX: PTT-LA Mix: 87.4 s — ABNORMAL HIGH (ref 0.0–48.9)

## 2018-05-04 ENCOUNTER — Ambulatory Visit: Payer: Medicare HMO | Attending: Neurology | Admitting: Physical Therapy

## 2018-05-04 ENCOUNTER — Encounter: Payer: Self-pay | Admitting: Physical Therapy

## 2018-05-04 ENCOUNTER — Other Ambulatory Visit: Payer: Self-pay

## 2018-05-04 DIAGNOSIS — M25551 Pain in right hip: Secondary | ICD-10-CM | POA: Diagnosis not present

## 2018-05-04 DIAGNOSIS — M79604 Pain in right leg: Secondary | ICD-10-CM | POA: Diagnosis not present

## 2018-05-04 DIAGNOSIS — R261 Paralytic gait: Secondary | ICD-10-CM

## 2018-05-04 DIAGNOSIS — R2689 Other abnormalities of gait and mobility: Secondary | ICD-10-CM | POA: Diagnosis not present

## 2018-05-04 DIAGNOSIS — R296 Repeated falls: Secondary | ICD-10-CM | POA: Diagnosis not present

## 2018-05-04 DIAGNOSIS — R293 Abnormal posture: Secondary | ICD-10-CM

## 2018-05-04 DIAGNOSIS — M6281 Muscle weakness (generalized): Secondary | ICD-10-CM

## 2018-05-04 DIAGNOSIS — R2681 Unsteadiness on feet: Secondary | ICD-10-CM | POA: Diagnosis not present

## 2018-05-04 DIAGNOSIS — R42 Dizziness and giddiness: Secondary | ICD-10-CM

## 2018-05-05 NOTE — Therapy (Signed)
[facility] [address] [place], [place], [address] Phone: [phone]   Fax:  [phone]  Physical Therapy Evaluation  Patient Details  Name: Mark [name] MRN: [id number] Date of Birth: [birth year] Referring Provider (PT): Mark Murdoch, MD (neurologist)   Encounter Date: 05/04/2018  PT End of Session - 05/04/18 1058    Visit Number  1    Number of Visits  3    Date for PT Re-Evaluation  08/02/18    Authorization Type  [employer] Medicare    PT Start Time  0932    PT Stop Time  1015    PT Time Calculation (min)  43 min    Activity Tolerance  Patient tolerated treatment well    Behavior During Therapy  [facility] for tasks assessed/performed       Past Medical History:  Diagnosis Date  . Chronic ITP (idiopathic thrombocytopenia) (HCC)   . Coronary artery disease 1992   MI  . DVT (deep venous thrombosis) ([place])   . 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 Hypertension   . Lupus anticoagulant disorder ([place])   . Multiple closed anterior-posterior compression fractures of pelvis ([place])   . Seizures ([place])   . Stroke [facility]) 02/27/2011   Left side weakness    Past Surgical History:  Procedure Laterality Date  . blood clot Right    surgical removal  . CHOLECYSTECTOMY    . CORONARY ANGIOPLASTY  1992  . SPLENECTOMY, TOTAL    . vertebralplasty      There were no vitals filed for this visit.   Subjective Assessment - 05/04/18 0938    Subjective  This 73yo male was referred back to PT on 05/01/2018 by Mark Murdoch, MD with spastic hemiparesis as late effect of cerebral infarction. His CVA with left spastic hemiparesis was 02/27/2011. He has right hip arthritis with "bone on bone" limiting moblity but is not a surgical candidate. He has had PT last year to work on gait & activities with platform RW. They purchased a LifeGlider walker and determine if safe for patient.    Patient is accompained by:  Family member   wife, [name]   Pertinent  History  CVA 02/27/2011, lupus, refractory immune thrombocytopenia, OA, CAD, Hep B, HTN, PVD, Sz, Kyphosis surgery    How long can you walk comfortably?  Patient arrived to PT evaluation ambulating with his platform RW without AFO. Wife brought AFO & LifeGlider walker to evaluation room.     Patient Stated Goals  To determine is LifeGlider safe & what can be done with walking without right hip pain not so bad.     Currently in Pain?  Yes    Pain Score  5    in last week, worst 9-10/10, lowest 2/10   Pain Location  Hip    Pain Orientation  Right    Pain Descriptors / Indicators  Sharp;Spasm;Cramping    Pain Type  Chronic pain    Pain Onset  More than a month ago    Pain Frequency  Constant    Aggravating Factors   standing, walking, weather. sitting in one position     Pain Relieving Factors  medications, laying down,     Multiple Pain Sites  Yes         [facility] PT Assessment - 05/04/18 0930      Assessment   Medical Diagnosis  Spastic hemiparesis late effect CVA     Referring Provider (PT)  Mark Murdoch, MD  neurologist   Onset Date/Surgical Date  05/01/18   MD referral to PT   Hand Dominance  Right    Prior Therapy  10 PT visits 08/05/2017-10/27/2017, 9 PT visits from 03/21/2015-02/04/2017, 10 visits 02/07/2015-03/28/2015      Precautions   Precautions  Fall      Restrictions   Weight Bearing Restrictions  No      Balance Screen   Has the patient fallen in the past 6 months  Yes    How many times?  1   tripped & hit door frame   Has the patient had a decrease in activity level because of a fear of falling?   Yes    Is the patient reluctant to leave their home because of a fear of falling?   Yes      [place] residence    Living Arrangements  Spouse/significant other    Available Help at Discharge  Family   grandson helps 3 days /wk & sister RN 1x/wk   Type of [place] to enter    Entrance Stairs-Number  of Steps  3 in front & 5 in back    [place]  One level    [place] - single point;Tub bench;Grab bars - tub/shower;Toilet riser;Grab bars - toilet;Hand held shower head;Transport chair;Walker - 2 wheels      Prior Function   Level of Independence  Needs assistance with gait;Needs assistance with transfers    Vocation  On disability    Leisure  computers, music (used to play multiple instruments)      Posture/Postural Control   Posture/Postural Control  Postural limitations    Postural Limitations  Rounded Shoulders;Forward head;Flexed trunk;Weight shift right      Tone   Assessment Location  Left Lower Extremity;Left Upper Extremity      Transfers   Transfers  Sit to Stand;Stand to Sit    Sit to Stand  5: Supervision;With upper extremity assist;From chair/3-in-1   to platform RW   Stand to Sit  5: Supervision;With upper extremity assist;To chair/3-in-1   from platform RW     Ambulation/Gait   Ambulation/Gait  Yes    Ambulation/Gait Assistance  4: Min assist;5: Supervision   intermittent minA for RW control / direction   Ambulation/Gait Assistance Details  Verbal & manual /tactile cues for RW control / position / orientation to line of progression    Ambulation Distance (Feet)  50 Feet   50' X 2   Assistive device  Left platform walker;Other (Comment)   AFO, did not wear ambulating in but used ambulating out   Gait Pattern  Step-to pattern;Decreased step length - left;Decreased step length - right;Decreased stance time - left;Decreased stride length;Decreased dorsiflexion - left;Decreased weight shift to left;Left hip hike;Left circumduction;Left steppage;Left foot flat;Left genu recurvatum;Antalgic;Lateral hip instability;Trunk flexed;Abducted - left;Poor foot clearance - left    Ambulation Surface  Indoor;Level      LUE Tone   LUE Tone  Hypertonic;Severe      LLE Tone   LLE Tone  Hypertonic;Severe                 Objective measurements completed on examination: See above findings.              PT Education - 05/04/18 1000    Education Details  PT put pads / soft cover  on AFO with demo, explanation to wife on proper orientation; PT donned AFO with wife videotaping on her phone. PT instructed in ambulating without AFO on LLE increases stress & work for right hip that is painful, arthritic.  LifeGlider is not safe device for this patient with 4 wheels with 2 swivel and only one brake. It also appears to be reverse walker which requires UEs to be retracted further than his posture / ROM would allow. His wife plans to return [employer] walker.     Person(s) Educated  Patient;Spouse    Methods  Explanation;Demonstration;Tactile cues;Verbal cues;Other (comment)   videotape on phone   Comprehension  Verbalized understanding;Need further instruction          PT Long Term Goals - 05/05/18 1224      PT LONG TERM GOAL #1   Title  Patient and wife demonstrate and verbalize understanding of ongoing HEP & fitness plan including [employer]. ([employer] LTGs 06/04/2018)    Status  New    [employer] Date  06/04/18      PT LONG TERM GOAL #2   Title  Patient ambulates 150' on paved surfaces with AFO & left platform RW with family's assistance safely.    Status  New    [employer] Date  06/04/18      PT LONG TERM GOAL #3   Title  Patient and wife verbalize understanding of recommendation for platform RW use & AFO wear for >90% of awake hours.     Status  New    [employer] Date  06/04/18             Plan - 05/04/18 1207    Clinical Impression Statement  This 73yo male has history of CVA with left spastic hemiparesis and osteoarthritis including right hip pain but he is not a surgical candidate. This PT has treated this patient over multiple episodes of care through the last few years. He continues to be dependent in gait & mobility with family assistance. He arrived to PT session without AFO  on LLE. PT instructed patient & his wife in increases right lower extremity especially hip to compensate for poor clearance without AFO with increase or aggravate his right hip pain. They also purchased a new walker (Lifeglider) which has 4 wheels with 2 swiveling. This Lifeglider walker appears unsafe for this patient even with training. His wife & patient appear to understand.  Patient would benefit from 2 PT visits to review & update HEP and activity level including gait with platform RW & AFO.     History and Personal Factors relevant to plan of care:  lives with wife who has back issues and impaired ability to assist patient without increasing her pain, pt has impaired memory and needs multiple repetition for comprehension    Clinical Presentation  Stable    Clinical Decision Making  Low    Rehab Potential  Good    PT Frequency  1x / week    PT Duration  2 weeks    PT Treatment/Interventions  ADLs/Self Care Home Management;Gait training;DME Instruction;Functional mobility training;Therapeutic activities;Therapeutic exercise;Balance training;Patient/family education    PT Next Visit Plan  review HEP & update as indicated, review gait & donning AFO    Consulted and Agree with Plan of Care  Patient;Family member/caregiver    Family Member Consulted  wife, [name]       Patient will benefit from skilled therapeutic intervention in order to improve the following deficits and impairments:  Abnormal gait, Decreased  activity tolerance, Decreased balance, Decreased coordination, Decreased endurance, Decreased knowledge of use of DME, Decreased mobility, Decreased range of motion, Impaired tone, Postural dysfunction, Pain, Decreased strength  Visit Diagnosis: Other abnormalities of gait and mobility  Unsteadiness on feet  Paralytic gait  Abnormal posture  Muscle weakness (generalized)  Pain in right hip  Repeated falls  Dizziness and giddiness  Pain in right leg     Problem  List Patient Active Problem List   Diagnosis Date Noted  . Pulmonary nodule 10/24/2016  . Acute hypoxemic respiratory failure ([place]) 10/21/2016  . Sepsis ([place]) 10/21/2016  . DVT (deep venous thrombosis) ([place]) 10/12/2016  . Supratherapeutic INR 10/12/2016  . Acute deep vein thrombosis (DVT) of popliteal vein of right lower extremity ([place]) 10/12/2016  . AKI (acute kidney injury) ([place]) 10/12/2016  . Fever 10/12/2016  . Leukocytosis 10/12/2016  . Bacteriuria 10/12/2016  . Deep vein thrombosis (DVT) ([place]) 03/02/2015  . Lupus anticoagulant disorder ([place]) 03/02/2015  . Ischemic stroke ([place]) 10/04/2014  . 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 Deep vein thrombosis ([place]) 10/04/2014  . Immune thrombocytopenic purpura ([place]) 10/04/2014  . LA (lupus anticoagulant) disorder ([place]) 10/04/2014  . Arteriosclerosis of coronary artery 09/10/2012  . Apnea, sleep 09/10/2012  . 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 Dermatophytic onychia 01/29/2012  . Spastic hemiplegia ([place]) 12/03/2011  . Hemiparesis, left ([place]) 10/31/2011  . Dysphonia 08/13/2011  . Idiopathic thrombocytopenic purpura ([place]) 08/12/2011  . ITP (idiopathic thrombocytopenic purpura) 08/12/2011    Jamey Reas PT, DPT 05/05/2018, 2:46 PM  [facility] [address] [place], [place], [address] Phone: [phone]   Fax:  [phone]  Name: Mark [name] MRN: [id number] Date of Birth: [birth year]

## 2018-05-11 ENCOUNTER — Ambulatory Visit: Payer: Medicare HMO | Admitting: Physical Therapy

## 2018-05-12 ENCOUNTER — Ambulatory Visit: Payer: Medicare HMO | Admitting: Physical Therapy

## 2018-05-13 ENCOUNTER — Telehealth: Payer: Self-pay | Admitting: *Deleted

## 2018-05-13 DIAGNOSIS — D693 Immune thrombocytopenic purpura: Secondary | ICD-10-CM

## 2018-05-13 MED ORDER — DEXAMETHASONE 4 MG PO TABS: ORAL_TABLET | ORAL | 0 refills | Status: DC

## 2018-05-13 NOTE — Telephone Encounter (Signed)
Wife reporting that patient has a petechial rash to his body. When this happens, he is supposed to start a steroid protocol. She is wanting to start the protocol, however since it's been awhile sine this happened, she wanted to notify the office.  Reviewed with Dr Marin Olp and he does want the patient to start in his steroids. He also wants patient to make an appointment for lab check.   New prescription sent. Message sent to scheduler. Detailed message left on wife's voice mail with above instructions.

## 2018-05-13 NOTE — Therapy (Signed)
[facility] [address] [place], [place], [address] Phone: [phone]   Fax:  [phone]  Patient Details  Name: Mark [name] MRN: [id number] Date of Birth: 1945-05-06 Referring Provider:  Harlan Stains, MD  Encounter Date: 05/12/2018   Patient arrived 13 minutes late to PT appointment and forgot previous HEP requested to bring to session. PT rescheduled appt.   Nijee Heatwole PT, DPT 05/13/2018, 11:00 AM  [facility] [address] [place], [place], [address] Phone: [phone]   Fax:  [phone]

## 2018-05-14 ENCOUNTER — Encounter: Payer: Self-pay | Admitting: Physical Therapy

## 2018-05-14 ENCOUNTER — Ambulatory Visit: Payer: Medicare HMO | Admitting: Physical Therapy

## 2018-05-14 ENCOUNTER — Other Ambulatory Visit: Payer: Self-pay | Admitting: *Deleted

## 2018-05-14 DIAGNOSIS — R2689 Other abnormalities of gait and mobility: Secondary | ICD-10-CM | POA: Diagnosis not present

## 2018-05-14 DIAGNOSIS — R2681 Unsteadiness on feet: Secondary | ICD-10-CM

## 2018-05-14 DIAGNOSIS — R261 Paralytic gait: Secondary | ICD-10-CM | POA: Diagnosis not present

## 2018-05-14 DIAGNOSIS — M25551 Pain in right hip: Secondary | ICD-10-CM | POA: Diagnosis not present

## 2018-05-14 DIAGNOSIS — R296 Repeated falls: Secondary | ICD-10-CM | POA: Diagnosis not present

## 2018-05-14 DIAGNOSIS — D693 Immune thrombocytopenic purpura: Secondary | ICD-10-CM

## 2018-05-14 DIAGNOSIS — R42 Dizziness and giddiness: Secondary | ICD-10-CM | POA: Diagnosis not present

## 2018-05-14 DIAGNOSIS — R293 Abnormal posture: Secondary | ICD-10-CM

## 2018-05-14 DIAGNOSIS — M79604 Pain in right leg: Secondary | ICD-10-CM | POA: Diagnosis not present

## 2018-05-14 DIAGNOSIS — M6281 Muscle weakness (generalized): Secondary | ICD-10-CM

## 2018-05-14 NOTE — Therapy (Signed)
[facility] [address] [place], [place], [address] Phone: [phone]   Fax:  [phone]  Physical Therapy Treatment  Patient Details  Name: Mark [name] MRN: [id number] Date of Birth: March 31, 1946 Referring Provider (PT): Theone Murdoch, MD (neurologist)   Encounter Date: 05/14/2018  PT End of Session - 05/14/18 1334    Visit Number  2    Number of Visits  3    Date for PT Re-Evaluation  08/02/18    Authorization Type  [employer]    PT Start Time  1232    PT Stop Time  1330    PT Time Calculation (min)  58 min    Activity Tolerance  Patient tolerated treatment well    Behavior During Therapy  [facility] for tasks assessed/performed       Past Medical History:  Diagnosis Date  . Chronic ITP (idiopathic thrombocytopenia) (HCC)   . Coronary artery disease 1992   MI  . DVT (deep venous thrombosis) ([place])   . 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 Hypertension   . Lupus anticoagulant disorder ([place])   . Multiple closed anterior-posterior compression fractures of pelvis ([place])   . Seizures ([place])   . Stroke [facility]) 02/27/2011   Left side weakness    Past Surgical History:  Procedure Laterality Date  . blood clot Right    surgical removal  . CHOLECYSTECTOMY    . CORONARY ANGIOPLASTY  1992  . SPLENECTOMY, TOTAL    . vertebralplasty      There were no vitals filed for this visit.  Subjective Assessment - 05/14/18 1239    Subjective  Patient & wife questioning leg pain and standing posture.     Patient is accompained by:  Family member   wife, [name]   Pertinent History  CVA 02/27/2011, lupus, refractory immune thrombocytopenia, OA, CAD, Hep B, HTN, PVD, Sz, Kyphosis surgery    How long can you walk comfortably?  Patient arrived to PT evaluation ambulating with his platform RW without AFO. Wife brought AFO & LifeGlider walker to evaluation room.     Patient Stated Goals  To determine is LifeGlider safe & what can be done with  walking without right hip pain not so bad.     Currently in Pain?  Yes    Pain Score  8     Pain Location  Hip    Pain Orientation  Right    Pain Descriptors / Indicators  Sharp;Spasm;Cramping    Pain Type  Chronic pain    Pain Onset  More than a month ago    Pain Frequency  Constant    Aggravating Factors   standing walking, weather, sitting in one position    Pain Relieving Factors  medications, laying down                       [facility] Adult PT Treatment/Exercise - 05/14/18 1230      Ambulation/Gait   Ambulation/Gait  Yes    Ambulation/Gait Assistance  5: Supervision    Ambulation/Gait Assistance Details  PT placed theraband on RW to use with family only around posterior pelvis to facilitate proper placement in RW.   PT also adjusted platform support for his LUE positioning.     Ambulation Distance (Feet)  100 Feet    Assistive device  Left platform walker;Other (Comment)   AFO   Ambulation Surface  Indoor;Level      Knee/Hip Exercises: Stretches  Passive Hamstring Stretch  Right;Left;30 seconds    Passive Hamstring Stretch Limitations  seated with LE extended & trunk flexion    Piriformis Stretch  Right;Left;2 reps;30 seconds    Piriformis Stretch Limitations  modified with foot on 6" box in front of LE and forward trunk flexion    Gastroc Stretch  Right;Left;30 seconds;2 reps    Gastroc Stretch Limitations  seated with knee extended and looped sheet around foot          Balance Exercises - 05/14/18 1230      OTAGO PROGRAM   Head Movements  Sitting;5 reps    Neck Movements  Sitting;5 reps    Back Extension  Standing;5 reps   platform RW support   Trunk Movements  Standing;5 reps   platform RW support   Ankle Movements  Sitting;10 reps    Knee Extensor  10 reps    Knee Flexor  10 reps   platform RW support   Hip ABductor  10 reps   platform RW support   Ankle Plantorflexors  --   platform RW support 10 reps   Ankle Dorsiflexors  --   platform  RW support 10 reps   Knee Bends  10 reps, support   platform RW support modified squat over chair   Backwards Walking  --   PT removed from his program   Walking and Turning Around  --   PT removed from his program   Sideways Walking  --   PT removed from his program   Tandem Stance  --   PT removed from his program   Tandem Walk  --   PT removed from his program   One Leg Stand  --   platform RW support, modified with single leg on 6" box   Heel Walking  --   PT removed from his program   Toe Walk  --   PT removed from his program   Heel Toe Walking Backward  --   PT removed from his program   Sit to Stand  5 reps, one support    Stair Walking  --   PT removed from his program   Overall OTAGO Comments  Used highlighters: yellow for exercises that he can do alone & blue for exercises that needs family assist/supervision;          PT Education - 05/14/18 1330    Education Details  HEP modified OTAGO; PT recommended breaking into 3 exercises grouped together working way thru program. If needed for compliance, he can do half of exercises on M, W, F and other half on Mark, Th, Sa    Person(s) Educated  Patient;Spouse    Methods  Explanation;Demonstration;Tactile cues;Verbal cues;Handout    Comprehension  Verbalized understanding;Returned demonstration;Verbal cues required;Tactile cues required;Need further instruction          PT Long Term Goals - 05/05/18 1224      PT LONG TERM GOAL #1   Title  Patient and wife demonstrate and verbalize understanding of ongoing HEP & fitness plan including [employer]. ([employer] LTGs 06/04/2018)    Status  New    [employer] Date  06/04/18      PT LONG TERM GOAL #2   Title  Patient ambulates 150' on paved surfaces with AFO & left platform RW with family's assistance safely.    Status  New    [employer] Date  06/04/18      PT LONG TERM GOAL #3   Title  Patient and wife verbalize understanding of recommendation for platform RW use & AFO wear for >90%  of awake hours.     Status  New    [employer] Date  06/04/18            Plan - 05/14/18 1355    Clinical Impression Statement  Patient and wife have understanding of HEP / exercises to do including how to modify as noted on pt education.     Rehab Potential  Good    PT Frequency  1x / week    PT Duration  2 weeks    PT Treatment/Interventions  ADLs/Self Care Home Management;Gait training;DME Instruction;Functional mobility training;Therapeutic activities;Therapeutic exercise;Balance training;Patient/family education    PT Next Visit Plan  check LTGs & how HEP is going    Consulted and Agree with Plan of Care  Patient;Family member/caregiver    Family Member Consulted  wife, [name]       Patient will benefit from skilled therapeutic intervention in order to improve the following deficits and impairments:  Abnormal gait, Decreased activity tolerance, Decreased balance, Decreased coordination, Decreased endurance, Decreased knowledge of use of DME, Decreased mobility, Decreased range of motion, Impaired tone, Postural dysfunction, Pain, Decreased strength  Visit Diagnosis: Other abnormalities of gait and mobility  Unsteadiness on feet  Paralytic gait  Abnormal posture  Muscle weakness (generalized)  Pain in right hip     Problem List Patient Active Problem List   Diagnosis Date Noted  . Pulmonary nodule 10/24/2016  . Acute hypoxemic respiratory failure ([place]) 10/21/2016  . Sepsis ([place]) 10/21/2016  . DVT (deep venous thrombosis) ([place]) 10/12/2016  . Supratherapeutic INR 10/12/2016  . Acute deep vein thrombosis (DVT) of popliteal vein of right lower extremity ([place]) 10/12/2016  . AKI (acute kidney injury) ([place]) 10/12/2016  . Fever 10/12/2016  . Leukocytosis 10/12/2016  . Bacteriuria 10/12/2016  . Deep vein thrombosis (DVT) ([place]) 03/02/2015  . Lupus anticoagulant disorder ([place]) 03/02/2015  . Ischemic stroke ([place]) 10/04/2014  . 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  Deep vein thrombosis ([place]) 10/04/2014  . 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 LA (lupus anticoagulant) disorder ([place]) 10/04/2014  . Arteriosclerosis of coronary artery 09/10/2012  . Apnea, sleep 09/10/2012  . 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 Dermatophytic onychia 01/29/2012  . Spastic hemiplegia ([place]) 12/03/2011  . Hemiparesis, left ([place]) 10/31/2011  . Dysphonia 08/13/2011  . Idiopathic thrombocytopenic purpura ([place]) 08/12/2011  . ITP (idiopathic thrombocytopenic purpura) 08/12/2011    Jamey Reas PT, DPT 05/14/2018, 1:59 PM  [facility] [address] [place], [place], [address] Phone: [phone]   Fax:  [phone]  Name: Mark [name] MRN: [id number] Date of Birth: 03/21/1946

## 2018-05-15 ENCOUNTER — Other Ambulatory Visit: Payer: Medicare HMO

## 2018-05-20 ENCOUNTER — Inpatient Hospital Stay: Payer: Medicare HMO | Attending: Hematology & Oncology

## 2018-05-20 DIAGNOSIS — D6862 Lupus anticoagulant syndrome: Secondary | ICD-10-CM | POA: Insufficient documentation

## 2018-05-20 DIAGNOSIS — Z7901 Long term (current) use of anticoagulants: Secondary | ICD-10-CM | POA: Insufficient documentation

## 2018-05-20 DIAGNOSIS — I69354 Hemiplegia and hemiparesis following cerebral infarction affecting left non-dominant side: Secondary | ICD-10-CM | POA: Insufficient documentation

## 2018-05-20 DIAGNOSIS — I82401 Acute embolism and thrombosis of unspecified deep veins of right lower extremity: Secondary | ICD-10-CM | POA: Insufficient documentation

## 2018-05-20 DIAGNOSIS — D693 Immune thrombocytopenic purpura: Secondary | ICD-10-CM | POA: Diagnosis not present

## 2018-05-20 LAB — CBC WITH DIFFERENTIAL (CANCER CENTER ONLY)
Abs Immature Granulocytes: 0.31 10*3/uL — ABNORMAL HIGH (ref 0.00–0.07)
BASOS ABS: 0 10*3/uL (ref 0.0–0.1)
Basophils Relative: 0 %
EOS ABS: 1 10*3/uL — AB (ref 0.0–0.5)
EOS PCT: 8 %
HEMATOCRIT: 42.5 % (ref 39.0–52.0)
HEMOGLOBIN: 14 g/dL (ref 13.0–17.0)
Immature Granulocytes: 2 %
LYMPHS ABS: 4.2 10*3/uL — AB (ref 0.7–4.0)
LYMPHS PCT: 32 %
MCH: 30.3 pg (ref 26.0–34.0)
MCHC: 32.9 g/dL (ref 30.0–36.0)
MCV: 92 fL (ref 80.0–100.0)
MONO ABS: 1.7 10*3/uL — AB (ref 0.1–1.0)
Monocytes Relative: 13 %
NRBC: 0 % (ref 0.0–0.2)
Neutro Abs: 5.8 10*3/uL (ref 1.7–7.7)
Neutrophils Relative %: 45 %
Platelet Count: 310 10*3/uL (ref 150–400)
RBC: 4.62 MIL/uL (ref 4.22–5.81)
RDW: 14.8 % (ref 11.5–15.5)
WBC: 13 10*3/uL — AB (ref 4.0–10.5)

## 2018-05-20 LAB — PLATELET BY CITRATE

## 2018-05-25 ENCOUNTER — Telehealth: Payer: Self-pay | Admitting: *Deleted

## 2018-05-25 NOTE — Telephone Encounter (Signed)
Message received from patient's wife wanting to know if it is ok for patient to take Prolia every six months.  Call placed back to patient to inform patient's wife that it is ok for patient to take Prolia every six months per order of Dr. Marin Olp. Pt.'s wife appreciative of call and has no further questions at this time.

## 2018-05-28 DIAGNOSIS — G4733 Obstructive sleep apnea (adult) (pediatric): Secondary | ICD-10-CM | POA: Diagnosis not present

## 2018-06-04 ENCOUNTER — Ambulatory Visit: Payer: Medicare HMO | Admitting: Physical Therapy

## 2018-06-18 ENCOUNTER — Ambulatory Visit: Payer: Medicare HMO | Admitting: Physical Therapy

## 2018-06-26 DIAGNOSIS — G4733 Obstructive sleep apnea (adult) (pediatric): Secondary | ICD-10-CM | POA: Diagnosis not present

## 2018-07-06 ENCOUNTER — Ambulatory Visit: Payer: Medicare HMO | Admitting: Physical Therapy

## 2018-07-08 ENCOUNTER — Other Ambulatory Visit: Payer: Medicare HMO

## 2018-07-08 ENCOUNTER — Ambulatory Visit: Payer: Medicare HMO | Admitting: Hematology & Oncology

## 2018-07-27 DIAGNOSIS — G4733 Obstructive sleep apnea (adult) (pediatric): Secondary | ICD-10-CM | POA: Diagnosis not present

## 2018-08-10 DIAGNOSIS — R69 Illness, unspecified: Secondary | ICD-10-CM | POA: Diagnosis not present

## 2018-08-12 DIAGNOSIS — L03031 Cellulitis of right toe: Secondary | ICD-10-CM | POA: Diagnosis not present

## 2018-08-14 DIAGNOSIS — J301 Allergic rhinitis due to pollen: Secondary | ICD-10-CM | POA: Diagnosis not present

## 2018-08-14 DIAGNOSIS — L03031 Cellulitis of right toe: Secondary | ICD-10-CM | POA: Diagnosis not present

## 2018-08-14 DIAGNOSIS — M810 Age-related osteoporosis without current pathological fracture: Secondary | ICD-10-CM | POA: Diagnosis not present

## 2018-08-17 DIAGNOSIS — R69 Illness, unspecified: Secondary | ICD-10-CM | POA: Diagnosis not present

## 2018-08-25 DIAGNOSIS — R69 Illness, unspecified: Secondary | ICD-10-CM | POA: Diagnosis not present

## 2018-08-26 DIAGNOSIS — G4733 Obstructive sleep apnea (adult) (pediatric): Secondary | ICD-10-CM | POA: Diagnosis not present

## 2018-09-03 DIAGNOSIS — R69 Illness, unspecified: Secondary | ICD-10-CM | POA: Diagnosis not present

## 2018-09-10 DIAGNOSIS — L97512 Non-pressure chronic ulcer of other part of right foot with fat layer exposed: Secondary | ICD-10-CM | POA: Diagnosis not present

## 2018-09-16 DIAGNOSIS — M810 Age-related osteoporosis without current pathological fracture: Secondary | ICD-10-CM | POA: Diagnosis not present

## 2018-09-24 DIAGNOSIS — I69359 Hemiplegia and hemiparesis following cerebral infarction affecting unspecified side: Secondary | ICD-10-CM | POA: Diagnosis not present

## 2018-09-24 DIAGNOSIS — E785 Hyperlipidemia, unspecified: Secondary | ICD-10-CM | POA: Diagnosis not present

## 2018-09-24 DIAGNOSIS — I129 Hypertensive chronic kidney disease with stage 1 through stage 4 chronic kidney disease, or unspecified chronic kidney disease: Secondary | ICD-10-CM | POA: Diagnosis not present

## 2018-09-24 DIAGNOSIS — R69 Illness, unspecified: Secondary | ICD-10-CM | POA: Diagnosis not present

## 2018-09-24 DIAGNOSIS — N183 Chronic kidney disease, stage 3 (moderate): Secondary | ICD-10-CM | POA: Diagnosis not present

## 2018-09-29 ENCOUNTER — Other Ambulatory Visit: Payer: Self-pay | Admitting: Hematology & Oncology

## 2018-09-29 DIAGNOSIS — D693 Immune thrombocytopenic purpura: Secondary | ICD-10-CM

## 2018-10-01 DIAGNOSIS — R69 Illness, unspecified: Secondary | ICD-10-CM | POA: Diagnosis not present

## 2018-10-08 DIAGNOSIS — G4733 Obstructive sleep apnea (adult) (pediatric): Secondary | ICD-10-CM | POA: Diagnosis not present

## 2018-10-19 DIAGNOSIS — R69 Illness, unspecified: Secondary | ICD-10-CM | POA: Diagnosis not present

## 2018-10-27 DIAGNOSIS — M1611 Unilateral primary osteoarthritis, right hip: Secondary | ICD-10-CM | POA: Diagnosis not present

## 2018-11-06 DIAGNOSIS — R69 Illness, unspecified: Secondary | ICD-10-CM | POA: Diagnosis not present

## 2018-11-10 ENCOUNTER — Other Ambulatory Visit: Payer: Self-pay | Admitting: Hematology & Oncology

## 2018-11-10 DIAGNOSIS — I824Y9 Acute embolism and thrombosis of unspecified deep veins of unspecified proximal lower extremity: Secondary | ICD-10-CM

## 2018-11-10 DIAGNOSIS — D6862 Lupus anticoagulant syndrome: Secondary | ICD-10-CM

## 2018-11-16 ENCOUNTER — Other Ambulatory Visit: Payer: Self-pay | Admitting: Family Medicine

## 2018-11-16 DIAGNOSIS — R911 Solitary pulmonary nodule: Secondary | ICD-10-CM

## 2018-12-12 ENCOUNTER — Other Ambulatory Visit: Payer: Self-pay | Admitting: Hematology & Oncology

## 2018-12-12 DIAGNOSIS — I824Y9 Acute embolism and thrombosis of unspecified deep veins of unspecified proximal lower extremity: Secondary | ICD-10-CM

## 2018-12-12 DIAGNOSIS — D6862 Lupus anticoagulant syndrome: Secondary | ICD-10-CM

## 2018-12-24 DIAGNOSIS — L989 Disorder of the skin and subcutaneous tissue, unspecified: Secondary | ICD-10-CM | POA: Diagnosis not present

## 2018-12-24 DIAGNOSIS — R32 Unspecified urinary incontinence: Secondary | ICD-10-CM | POA: Diagnosis not present

## 2018-12-28 ENCOUNTER — Other Ambulatory Visit: Payer: Self-pay | Admitting: Hematology & Oncology

## 2018-12-28 DIAGNOSIS — D693 Immune thrombocytopenic purpura: Secondary | ICD-10-CM

## 2019-01-06 DIAGNOSIS — R32 Unspecified urinary incontinence: Secondary | ICD-10-CM | POA: Diagnosis not present

## 2019-01-12 DIAGNOSIS — N319 Neuromuscular dysfunction of bladder, unspecified: Secondary | ICD-10-CM | POA: Diagnosis not present

## 2019-01-12 DIAGNOSIS — N3944 Nocturnal enuresis: Secondary | ICD-10-CM | POA: Diagnosis not present

## 2019-01-14 ENCOUNTER — Other Ambulatory Visit: Payer: Self-pay | Admitting: Hematology & Oncology

## 2019-01-14 DIAGNOSIS — D6862 Lupus anticoagulant syndrome: Secondary | ICD-10-CM

## 2019-01-14 DIAGNOSIS — I824Y9 Acute embolism and thrombosis of unspecified deep veins of unspecified proximal lower extremity: Secondary | ICD-10-CM

## 2019-01-19 ENCOUNTER — Encounter: Payer: Self-pay | Admitting: Physical Therapy

## 2019-01-19 NOTE — Therapy (Signed)
[facility] [address] [place], [place], [address] Phone: [phone]   Fax:  [phone]  Patient Details  Name: Mark [name] MRN: [id number] Date of Birth: [birth year] Referring Provider:  Theone Murdoch, MD  Encounter Date: 01/19/2019  PHYSICAL THERAPY DISCHARGE SUMMARY  Visits from Start of Care: 2  Current functional level related to goals / functional outcomes: See last PT noted on 05/14/2018 for details.    Remaining deficits: Hemiplegia / Weakness & hypertonicity from chronic CVA.  High fall risk. Gait dependency.    Education / Equipment: None new this episode.   Plan: Patient agrees to discharge.  Patient goals were not met. Patient is being discharged due to not returning since the last visit.  ?????         Neya Creegan PT, DPT 01/19/2019, 4:28 PM  [facility] [address] [place], [place], [address] Phone: [phone]   Fax:  [phone]

## 2019-01-21 ENCOUNTER — Telehealth: Payer: Self-pay | Admitting: Hematology & Oncology

## 2019-01-21 NOTE — Telephone Encounter (Signed)
Called and LMVM for patient with appointment date/time per 10/7 sch msg

## 2019-02-02 ENCOUNTER — Other Ambulatory Visit: Payer: Medicare HMO

## 2019-02-08 ENCOUNTER — Inpatient Hospital Stay: Admission: RE | Admit: 2019-02-08 | Payer: Medicare HMO | Source: Ambulatory Visit

## 2019-02-11 ENCOUNTER — Other Ambulatory Visit: Payer: Medicare HMO

## 2019-02-15 ENCOUNTER — Telehealth: Payer: Self-pay | Admitting: Hematology & Oncology

## 2019-02-15 NOTE — Telephone Encounter (Signed)
Patient's wife called to reschedule appts from 02/2019 to 03/2019.

## 2019-02-17 ENCOUNTER — Ambulatory Visit: Payer: Medicare HMO | Admitting: Hematology & Oncology

## 2019-02-17 ENCOUNTER — Other Ambulatory Visit: Payer: Medicare HMO

## 2019-02-20 ENCOUNTER — Other Ambulatory Visit: Payer: Self-pay | Admitting: Hematology & Oncology

## 2019-02-20 DIAGNOSIS — I824Y9 Acute embolism and thrombosis of unspecified deep veins of unspecified proximal lower extremity: Secondary | ICD-10-CM

## 2019-02-20 DIAGNOSIS — D6862 Lupus anticoagulant syndrome: Secondary | ICD-10-CM

## 2019-03-02 DIAGNOSIS — M1611 Unilateral primary osteoarthritis, right hip: Secondary | ICD-10-CM | POA: Diagnosis not present

## 2019-03-18 DIAGNOSIS — N3944 Nocturnal enuresis: Secondary | ICD-10-CM | POA: Diagnosis not present

## 2019-03-18 DIAGNOSIS — N312 Flaccid neuropathic bladder, not elsewhere classified: Secondary | ICD-10-CM | POA: Diagnosis not present

## 2019-03-22 ENCOUNTER — Inpatient Hospital Stay: Payer: Medicare HMO | Attending: Hematology & Oncology

## 2019-03-22 ENCOUNTER — Inpatient Hospital Stay: Payer: Medicare HMO | Admitting: Hematology & Oncology

## 2019-03-24 DIAGNOSIS — Z23 Encounter for immunization: Secondary | ICD-10-CM | POA: Diagnosis not present

## 2019-03-29 DIAGNOSIS — I69359 Hemiplegia and hemiparesis following cerebral infarction affecting unspecified side: Secondary | ICD-10-CM | POA: Diagnosis not present

## 2019-03-29 DIAGNOSIS — R32 Unspecified urinary incontinence: Secondary | ICD-10-CM | POA: Diagnosis not present

## 2019-03-30 ENCOUNTER — Other Ambulatory Visit: Payer: Self-pay | Admitting: Hematology & Oncology

## 2019-03-30 DIAGNOSIS — I824Y9 Acute embolism and thrombosis of unspecified deep veins of unspecified proximal lower extremity: Secondary | ICD-10-CM

## 2019-03-30 DIAGNOSIS — D6862 Lupus anticoagulant syndrome: Secondary | ICD-10-CM

## 2019-04-05 DIAGNOSIS — R3914 Feeling of incomplete bladder emptying: Secondary | ICD-10-CM | POA: Diagnosis not present

## 2019-04-05 DIAGNOSIS — N3944 Nocturnal enuresis: Secondary | ICD-10-CM | POA: Diagnosis not present

## 2019-04-05 DIAGNOSIS — N312 Flaccid neuropathic bladder, not elsewhere classified: Secondary | ICD-10-CM | POA: Diagnosis not present

## 2019-04-05 DIAGNOSIS — Z125 Encounter for screening for malignant neoplasm of prostate: Secondary | ICD-10-CM | POA: Diagnosis not present

## 2019-04-14 DIAGNOSIS — N312 Flaccid neuropathic bladder, not elsewhere classified: Secondary | ICD-10-CM | POA: Diagnosis not present

## 2019-05-03 ENCOUNTER — Other Ambulatory Visit: Payer: Self-pay | Admitting: Hematology & Oncology

## 2019-05-03 DIAGNOSIS — I824Y9 Acute embolism and thrombosis of unspecified deep veins of unspecified proximal lower extremity: Secondary | ICD-10-CM

## 2019-05-03 DIAGNOSIS — D6862 Lupus anticoagulant syndrome: Secondary | ICD-10-CM

## 2019-05-25 DIAGNOSIS — M810 Age-related osteoporosis without current pathological fracture: Secondary | ICD-10-CM | POA: Diagnosis not present

## 2019-05-28 DIAGNOSIS — R339 Retention of urine, unspecified: Secondary | ICD-10-CM | POA: Diagnosis not present

## 2019-05-28 DIAGNOSIS — N319 Neuromuscular dysfunction of bladder, unspecified: Secondary | ICD-10-CM | POA: Diagnosis not present

## 2019-05-31 ENCOUNTER — Telehealth: Payer: Self-pay | Admitting: *Deleted

## 2019-05-31 ENCOUNTER — Other Ambulatory Visit: Payer: Self-pay | Admitting: *Deleted

## 2019-05-31 ENCOUNTER — Other Ambulatory Visit: Payer: Self-pay | Admitting: Hematology & Oncology

## 2019-05-31 DIAGNOSIS — D693 Immune thrombocytopenic purpura: Secondary | ICD-10-CM

## 2019-05-31 DIAGNOSIS — D6862 Lupus anticoagulant syndrome: Secondary | ICD-10-CM

## 2019-05-31 NOTE — Telephone Encounter (Signed)
Message received from patient's wife stating that patient has petechiae on his back, bilateral arms and legs and is requesting a refill of Decadron.  Dr. Marin Olp notified and order received for patient to come in tomorrow for labs and to see Dr. Marin Olp.  Call placed back to patient's wife to notify her that Dr. Marin Olp would like to see him tomorrow for labs and appt with Dr. Marin Olp.  Pt.'s wife appreciative of call back and states that they will be here tomorrow for 11:30AM. [employer].

## 2019-06-01 ENCOUNTER — Other Ambulatory Visit: Payer: Medicare HMO

## 2019-06-01 ENCOUNTER — Ambulatory Visit: Payer: Medicare HMO | Admitting: Hematology & Oncology

## 2019-06-01 ENCOUNTER — Telehealth: Payer: Self-pay | Admitting: *Deleted

## 2019-06-01 NOTE — Telephone Encounter (Signed)
Call received from patient's wife stating that she had a fall last night and is not able to get pt here for appt today and would like to move today's appt to tomorrow.  She states that pt.'s petechiae is better today.  Dr. Marin Olp notified.  Pt.'s wife transferred to scheduling.

## 2019-06-02 ENCOUNTER — Other Ambulatory Visit: Payer: Self-pay | Admitting: Hematology & Oncology

## 2019-06-02 ENCOUNTER — Encounter: Payer: Self-pay | Admitting: Hematology & Oncology

## 2019-06-02 ENCOUNTER — Inpatient Hospital Stay (HOSPITAL_BASED_OUTPATIENT_CLINIC_OR_DEPARTMENT_OTHER): Payer: Medicare HMO | Admitting: Hematology & Oncology

## 2019-06-02 ENCOUNTER — Inpatient Hospital Stay: Payer: Medicare HMO | Attending: Hematology & Oncology

## 2019-06-02 ENCOUNTER — Other Ambulatory Visit: Payer: Self-pay

## 2019-06-02 DIAGNOSIS — I824Y9 Acute embolism and thrombosis of unspecified deep veins of unspecified proximal lower extremity: Secondary | ICD-10-CM

## 2019-06-02 DIAGNOSIS — I69354 Hemiplegia and hemiparesis following cerebral infarction affecting left non-dominant side: Secondary | ICD-10-CM | POA: Diagnosis not present

## 2019-06-02 DIAGNOSIS — I10 Essential (primary) hypertension: Secondary | ICD-10-CM | POA: Insufficient documentation

## 2019-06-02 DIAGNOSIS — Z7901 Long term (current) use of anticoagulants: Secondary | ICD-10-CM | POA: Diagnosis not present

## 2019-06-02 DIAGNOSIS — E785 Hyperlipidemia, unspecified: Secondary | ICD-10-CM | POA: Diagnosis not present

## 2019-06-02 DIAGNOSIS — D693 Immune thrombocytopenic purpura: Secondary | ICD-10-CM | POA: Insufficient documentation

## 2019-06-02 DIAGNOSIS — D6862 Lupus anticoagulant syndrome: Secondary | ICD-10-CM

## 2019-06-02 DIAGNOSIS — Z7982 Long term (current) use of aspirin: Secondary | ICD-10-CM | POA: Insufficient documentation

## 2019-06-02 DIAGNOSIS — R531 Weakness: Secondary | ICD-10-CM | POA: Insufficient documentation

## 2019-06-02 LAB — CBC WITH DIFFERENTIAL (CANCER CENTER ONLY)
Abs Immature Granulocytes: 0.05 10*3/uL (ref 0.00–0.07)
Basophils Absolute: 0.1 10*3/uL (ref 0.0–0.1)
Basophils Relative: 1 %
Eosinophils Absolute: 0.4 10*3/uL (ref 0.0–0.5)
Eosinophils Relative: 4 %
HCT: 38.2 % — ABNORMAL LOW (ref 39.0–52.0)
Hemoglobin: 12.8 g/dL — ABNORMAL LOW (ref 13.0–17.0)
Immature Granulocytes: 1 %
Lymphocytes Relative: 34 %
Lymphs Abs: 3.7 10*3/uL (ref 0.7–4.0)
MCH: 30.1 pg (ref 26.0–34.0)
MCHC: 33.5 g/dL (ref 30.0–36.0)
MCV: 89.9 fL (ref 80.0–100.0)
Monocytes Absolute: 1.9 10*3/uL — ABNORMAL HIGH (ref 0.1–1.0)
Monocytes Relative: 17 %
Neutro Abs: 4.8 10*3/uL (ref 1.7–7.7)
Neutrophils Relative %: 43 %
Platelet Count: 189 10*3/uL (ref 150–400)
RBC: 4.25 MIL/uL (ref 4.22–5.81)
RDW: 13.8 % (ref 11.5–15.5)
WBC Count: 10.9 10*3/uL — ABNORMAL HIGH (ref 4.0–10.5)
nRBC: 0 % (ref 0.0–0.2)

## 2019-06-02 LAB — CMP (CANCER CENTER ONLY)
ALT: 25 U/L (ref 0–44)
AST: 19 U/L (ref 15–41)
Albumin: 5 g/dL (ref 3.5–5.0)
Alkaline Phosphatase: 61 U/L (ref 38–126)
Anion gap: 8 (ref 5–15)
BUN: 36 mg/dL — ABNORMAL HIGH (ref 8–23)
CO2: 25 mmol/L (ref 22–32)
Calcium: 10.1 mg/dL (ref 8.9–10.3)
Chloride: 105 mmol/L (ref 98–111)
Creatinine: 1.24 mg/dL (ref 0.61–1.24)
GFR, Est AFR Am: >60 mL/min (ref >60)
GFR, Estimated: 57 mL/min — ABNORMAL LOW (ref >60)
Glucose, Bld: 91 mg/dL (ref 70–99)
Potassium: 4.1 mmol/L (ref 3.5–5.1)
Sodium: 138 mmol/L (ref 135–145)
Total Bilirubin: 0.5 mg/dL (ref 0.3–1.2)
Total Protein: 7.2 g/dL (ref 6.5–8.1)

## 2019-06-02 LAB — PLATELET BY CITRATE

## 2019-06-02 MED ORDER — RIVAROXABAN 20 MG PO TABS: ORAL_TABLET | ORAL | 0 refills | Status: DC

## 2019-06-02 MED ORDER — DEXAMETHASONE 4 MG PO TABS: ORAL_TABLET | ORAL | 4 refills | Status: DC

## 2019-06-02 MED ORDER — DEXAMETHASONE 4 MG PO TABS: ORAL_TABLET | ORAL | 0 refills | Status: DC

## 2019-06-02 NOTE — Progress Notes (Signed)
Hematology and Oncology Follow Up Visit  CASIMIR GRISSETT [id number] [birth year] 74 y.o. 06/02/2019   Principle Diagnosis:  1. Refractory immune thrombocytopenia. 2. 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 3. Positive lupus anticoagulant.  Current Therapy:   Xarelto 20 mg PO daily   Interim History:  Mr. Snyder is here today with his wife for a long awaited follow-up.  And because of the coronavirus, there have been at home.  They have really not gone anywhere.  His wife called [place] a couple days ago saying that he had petechia on his back.  Typically, when his platelet count goes down he begins to have petechia.  He has been doing quite well.  He really has had no complaints.  He still has the weakness on the left side.  His would not be able to do a lot of physical therapy because of the coronavirus.  He has had a little bit of urinary retention.  He can urinate but does not empty his bladder fully.  His wife has to catheterize him daily.  He has had no headache.  He has had no visual changes.  He has had no fever.  He has had no cough.  As always, it is a lot of fun to catch up with him and find out what is going on in their lives.  Overall, his performance status is ECOG 2.    Medications:  Allergies as of 06/02/2019   No Known Allergies     Medication List       Accurate as of June 02, 2019  4:35 PM. If you have any questions, ask your nurse or doctor.        acetaminophen 325 MG tablet Commonly known as: TYLENOL Take by mouth every morning.   amLODipine 5 MG tablet Commonly known as: NORVASC daily.   aspirin 81 MG chewable tablet Chew 1 tablet (81 mg total) by mouth at bedtime.   buPROPion 150 MG 24 hr tablet Commonly known as: WELLBUTRIN XL Take 150 mg by mouth daily.   Crestor 20 MG tablet Generic drug: rosuvastatin Take 20 mg by mouth at bedtime.   dexamethasone 4 MG tablet Commonly known as: DECADRON TAKE 10 TABLETS BY  MOUTH ONCE DAILY AS NEEDED FOR 3 DAYS FOR  ITP   fluticasone 50 MCG/ACT nasal spray Commonly known as: FLONASE Place 2 sprays into both nostrils daily as needed for allergies or rhinitis.   HYDROcodone-acetaminophen 5-325 MG tablet Commonly known as: NORCO/VICODIN Take 2 tablets by mouth every 6 (six) hours as needed for moderate pain.   levETIRAcetam 500 MG tablet Commonly known as: KEPPRA Take 1,500 mg by mouth 2 (two) times daily. 3 tabs  in am and 3 tabs in pm   Melatonin 1 MG Tabs Take by mouth at bedtime as needed for sleep.   multivitamin with minerals tablet Take 1 tablet by mouth 2 (two) times daily.   nystatin powder Generic drug: nystatin   omeprazole 20 MG capsule Commonly known as: PRILOSEC Take 20 mg by mouth daily.   polyethylene glycol 17 g packet Commonly known as: MIRALAX / GLYCOLAX Take 17 g by mouth daily as needed.   rivaroxaban 20 MG Tabs tablet Commonly known as: Xarelto Take 1 tablet by mouth once daily with breakfast   sertraline 100 MG tablet Commonly known as: ZOLOFT Take 200 mg by mouth daily.   tamsulosin 0.4 MG Caps capsule Commonly known as: FLOMAX Take 0.8 mg by mouth at bedtime.  Vitamin D3 25 MCG (1000 UT) Caps Take 2,000 Units by mouth 2 (two) times daily after a meal.       Allergies: No Known Allergies  Past Medical History, Surgical history, Social history, and Family History were reviewed and updated.  Review of Systems: Review of Systems  HENT: Negative.   Eyes: Negative.   Respiratory: Negative.   Cardiovascular: Negative.   Gastrointestinal: Negative.   Genitourinary: Negative.   Musculoskeletal: Positive for joint pain.  Skin: Negative.   Neurological: Positive for focal weakness.  Endo/Heme/Allergies: Negative.   Psychiatric/Behavioral: Negative.      Physical Exam:  weight is 197 lb (89.4 kg). His temporal temperature is 97.1 F (36.2 C) (abnormal). His blood pressure is 136/78 and his pulse is 64.  His respiration is 18 and oxygen saturation is 98%.   Wt Readings from Last 3 Encounters:  06/02/19 197 lb (89.4 kg)  04/29/18 192 lb (87.1 kg)  10/08/17 197 lb (89.4 kg)    Physical Exam Vitals reviewed.  HENT:     Head: Normocephalic and atraumatic.  Eyes:     Pupils: Pupils are equal, round, and reactive to light.  Cardiovascular:     Rate and Rhythm: Normal rate and regular rhythm.     Heart sounds: Normal heart sounds.  Pulmonary:     Effort: Pulmonary effort is normal.     Breath sounds: Normal breath sounds.  Abdominal:     General: Bowel sounds are normal.     Palpations: Abdomen is soft.  Musculoskeletal:        General: No tenderness or deformity. Normal range of motion.     Cervical back: Normal range of motion.  Lymphadenopathy:     Cervical: No cervical adenopathy.  Skin:    General: Skin is warm and dry.     Findings: No erythema or rash.     Comments: I do not see any ecchymoses or petechia on the skin.  There might be a couple on his upper back but they appear to be resolving.  Neurological:     Mental Status: He is alert and oriented to person, place, and time.     Comments: He has the weakness on his left side.  This is pretty much unchanged.  He has no problems on his right side.  Cranial nerves are intact.  Psychiatric:        Behavior: Behavior normal.        Thought Content: Thought content normal.        Judgment: Judgment normal.     Lab Results  Component Value Date   WBC 10.9 (H) 06/02/2019   HGB 12.8 (L) 06/02/2019   HCT 38.2 (L) 06/02/2019   MCV 89.9 06/02/2019   PLT 189 06/02/2019   Lab Results  Component Value Date   FERRITIN 1,063 (H) 10/23/2016   IRON 27 (L) 10/23/2016   TIBC 160 (L) 10/23/2016   UIBC 133 10/23/2016   IRONPCTSAT 17 (L) 10/23/2016   Lab Results  Component Value Date   RETICCTPCT 1.5 08/20/2010   RBC 4.25 06/02/2019   RETICCTABS 75.3 08/20/2010   No results found for: KPAFRELGTCHN, LAMBDASER,  KAPLAMBRATIO No results found for: IGGSERUM, IGA, IGMSERUM No results found for: Ronnald Ramp, A1GS, A2GS, Violet Baldy MSPIKE, SPEI   Chemistry      Component Value Date/Time   NA 138 06/02/2019 1502   NA 146 (H) 04/09/2017 1405   NA 138 05/09/2016 1321   K 4.1 06/02/2019 1502  K 4.0 04/09/2017 1405   K 4.3 05/09/2016 1321   CL 105 06/02/2019 1502   CL 108 04/09/2017 1405   CO2 25 06/02/2019 1502   CO2 23 04/09/2017 1405   CO2 22 05/09/2016 1321   BUN 36 (H) 06/02/2019 1502   BUN 34 (H) 04/09/2017 1405   BUN 36.4 (H) 05/09/2016 1321   CREATININE 1.24 06/02/2019 1502   CREATININE 1.3 (H) 04/09/2017 1405   CREATININE 1.5 (H) 05/09/2016 1321      Component Value Date/Time   CALCIUM 10.1 06/02/2019 1502   CALCIUM 9.1 04/09/2017 1405   CALCIUM 9.9 05/09/2016 1321   ALKPHOS 61 06/02/2019 1502   ALKPHOS 61 04/09/2017 1405   ALKPHOS 103 05/09/2016 1321   AST 19 06/02/2019 1502   AST 39 (H) 05/09/2016 1321   ALT 25 06/02/2019 1502   ALT 57 (H) 04/09/2017 1405   ALT 64 (H) 05/09/2016 1321   BILITOT 0.5 06/02/2019 1502   BILITOT 0.49 05/09/2016 1321      Impression and Plan: Mr. Regas is a very pleasant 74 yo caucasian gentleman with refractory immune thrombocytopenia. 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 He is now on Xarelto and doing well.   I am happy that his platelet count is doing well.  Again, he has episodes where his platelet count goes down and then comes back up.  He was takes Decadron for this.  We will call Decadron in for him.  Given his status neurologically, and because of the coronavirus, we will not make a follow-up appointment for him right now.  They know that they can always call us if there are any problems.   Volanda Napoleon, MD 2/17/20214:35 PM

## 2019-06-03 ENCOUNTER — Telehealth: Payer: Self-pay | Admitting: Hematology & Oncology

## 2019-06-03 NOTE — Telephone Encounter (Signed)
No los 2/17 °

## 2019-06-09 LAB — DRVVT CONFIRM: dRVVT Confirm: >2.5 ratio — ABNORMAL HIGH (ref 0.8–1.2)

## 2019-06-09 LAB — HEXAGONAL PHASE PHOSPHOLIPID: Hexagonal Phase Phospholipid: 34 s — ABNORMAL HIGH (ref 0–11)

## 2019-06-09 LAB — LUPUS ANTICOAGULANT PANEL
DRVVT: >180 s — ABNORMAL HIGH (ref 0.0–47.0)
PTT Lupus Anticoagulant: 117.3 s — ABNORMAL HIGH (ref 0.0–51.9)

## 2019-06-09 LAB — DRVVT MIX: dRVVT Mix: 136.6 s — ABNORMAL HIGH (ref 0.0–40.4)

## 2019-06-09 LAB — PTT-LA MIX: PTT-LA Mix: 66 s — ABNORMAL HIGH (ref 0.0–48.9)

## 2019-08-07 ENCOUNTER — Encounter (HOSPITAL_BASED_OUTPATIENT_CLINIC_OR_DEPARTMENT_OTHER): Payer: Self-pay

## 2019-08-07 ENCOUNTER — Emergency Department (HOSPITAL_BASED_OUTPATIENT_CLINIC_OR_DEPARTMENT_OTHER)
Admission: EM | Admit: 2019-08-07 | Discharge: 2019-08-07 | Disposition: A | Discharge status: Home / Self Care | Payer: Medicare HMO | Attending: Emergency Medicine | Admitting: Emergency Medicine

## 2019-08-07 ENCOUNTER — Other Ambulatory Visit: Payer: Self-pay

## 2019-08-07 ENCOUNTER — Emergency Department (HOSPITAL_BASED_OUTPATIENT_CLINIC_OR_DEPARTMENT_OTHER): Payer: Medicare HMO

## 2019-08-07 ENCOUNTER — Ambulatory Visit
Admission: RE | Admit: 2019-08-07 | Discharge: 2019-08-07 | Disposition: A | Discharge status: Home / Self Care | Payer: Medicare HMO | Source: Ambulatory Visit | Attending: Emergency Medicine | Admitting: Emergency Medicine

## 2019-08-07 DIAGNOSIS — R519 Headache, unspecified: Secondary | ICD-10-CM | POA: Insufficient documentation

## 2019-08-07 DIAGNOSIS — H748X1 Other specified disorders of right middle ear and mastoid: Secondary | ICD-10-CM | POA: Insufficient documentation

## 2019-08-07 DIAGNOSIS — Z7982 Long term (current) use of aspirin: Secondary | ICD-10-CM | POA: Insufficient documentation

## 2019-08-07 DIAGNOSIS — D693 Immune thrombocytopenic purpura: Secondary | ICD-10-CM | POA: Diagnosis not present

## 2019-08-07 DIAGNOSIS — H73891 Other specified disorders of tympanic membrane, right ear: Secondary | ICD-10-CM | POA: Diagnosis not present

## 2019-08-07 DIAGNOSIS — Z79899 Other long term (current) drug therapy: Secondary | ICD-10-CM | POA: Diagnosis not present

## 2019-08-07 DIAGNOSIS — Z87891 Personal history of nicotine dependence: Secondary | ICD-10-CM | POA: Diagnosis not present

## 2019-08-07 DIAGNOSIS — Z7901 Long term (current) use of anticoagulants: Secondary | ICD-10-CM | POA: Insufficient documentation

## 2019-08-07 DIAGNOSIS — H9211 Otorrhea, right ear: Secondary | ICD-10-CM | POA: Diagnosis present

## 2019-08-07 LAB — CBC WITH DIFFERENTIAL/PLATELET
Abs Immature Granulocytes: 0.05 10*3/uL (ref 0.00–0.07)
Basophils Absolute: 0.1 10*3/uL (ref 0.0–0.1)
Basophils Relative: 1 %
Eosinophils Absolute: 0.5 10*3/uL (ref 0.0–0.5)
Eosinophils Relative: 4 %
HCT: 42.1 % (ref 39.0–52.0)
Hemoglobin: 14.1 g/dL (ref 13.0–17.0)
Immature Granulocytes: 0 %
Lymphocytes Relative: 24 %
Lymphs Abs: 2.8 10*3/uL (ref 0.7–4.0)
MCH: 30.6 pg (ref 26.0–34.0)
MCHC: 33.5 g/dL (ref 30.0–36.0)
MCV: 91.3 fL (ref 80.0–100.0)
Monocytes Absolute: 1.5 10*3/uL — ABNORMAL HIGH (ref 0.1–1.0)
Monocytes Relative: 13 %
Neutro Abs: 6.6 10*3/uL (ref 1.7–7.7)
Neutrophils Relative %: 58 %
Platelets: 270 10*3/uL (ref 150–400)
RBC: 4.61 MIL/uL (ref 4.22–5.81)
RDW: 13.9 % (ref 11.5–15.5)
WBC: 11.5 10*3/uL — ABNORMAL HIGH (ref 4.0–10.5)
nRBC: 0 % (ref 0.0–0.2)

## 2019-08-07 MED ORDER — CIPROFLOXACIN-DEXAMETHASONE 0.3-0.1 % OT SUSP: 4.0000 [drp] | Freq: Two times a day (BID) | OTIC | 0 refills | Status: AC

## 2019-08-07 MED ORDER — OXYMETAZOLINE HCL 0.05 % NA SOLN
1.0000 | Freq: Once | NASAL | Status: AC
  Administered 2019-08-07: 1 via NASAL
  Filled 2019-08-07: qty 30

## 2019-08-07 NOTE — ED Triage Notes (Signed)
Pt woke up this AM with bleeding from ear canal. Denies trauma. Pt does use Q-Tips. Pt on blood thinners.

## 2019-08-07 NOTE — ED Notes (Signed)
ED Provider at bedside. 

## 2019-08-07 NOTE — ED Notes (Signed)
Pt discharged to home. Discharge instructions have been discussed with patient and/or family members. Pt verbally acknowledges understanding d/c instructions, and endorses comprehension to checkout at registration before leaving.  °

## 2019-08-07 NOTE — ED Provider Notes (Signed)
I connected with  Mark [name] on 08/07/19 by a video enabled telemedicine application and verified that I am speaking with the correct person using two identifiers.   I discussed the limitations of evaluation and management by telemedicine. The patient expressed understanding and agreed to proceed.    Pt was evaluated over a telephone call, as video cannot be established.  Patient and his wife were aware of the limitations.  Patient's wife states he started having bleeding from his ear, has bled through 2 pads.  Additionally, he has recently developed a headache.  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  They are extremely concerned about coming to the ER because of Covid.  However, due to patient's medical history and concerning symptoms, I discussed that he will need to be evaluated in person, and recommended that he come to the ER.  Patient and wife state they understand and agree to plan.   Past medical history of CAD, hypertension, previous DVT, chronic ITP, lupus and anticoagulant disorder, hep B, previous stroke, seizures.    PE: Unable to be performed due to lack of video availability     Franchot Heidelberg, PA-C 08/07/19 1421    Little, Wenda Overland, MD 08/08/19 [phone]

## 2019-08-07 NOTE — Discharge Instructions (Signed)
Keep the Afrin cottonball on the ear until tomorrow.  Tomorrow he can start using the eardrops.  Follow-up with the ENT on Monday.  He need to call make an appointment.  Return here if the bleeding worsens.

## 2019-08-07 NOTE — ED Provider Notes (Signed)
[facility] EMERGENCY DEPARTMENT Provider Note   CSN: [id number] Arrival date & time: 08/07/19  1458     History Chief Complaint  Patient presents with  . Ear Drainage    Mark [name] is a 74 y.o. male.  Patient is a 74 year old male with a history of chronic ITP, CABG, coronary pain, prior stroke with left-sided weakness.  He is on Xarelto.  He presents with bleeding from his right ear.  His wife noticed it in the early morning hours around 4 AM this morning with some blood on the pillow.  It has been bleeding throughout the day and clots were pulled out at one point.  He denies any known trauma.  He did have a sharp fingernail that he feels make have scratched in canal.  He has hearing loss and hearing aids but did not have any change in his hearing. He had a slight headache earlier today, but none now.  No recent falls or head trauma.  No other sites of bruising or bleeding.  No petechiae.        Past Medical History:  Diagnosis Date  . Chronic ITP (idiopathic thrombocytopenia) (HCC)   . Coronary artery disease 1992   MI  . DVT (deep venous thrombosis) ([place])   . 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 Hypertension   . Lupus anticoagulant disorder ([place])   . Multiple closed anterior-posterior compression fractures of pelvis ([place])   . Seizures ([place])   . Stroke [facility]) 02/27/2011   Left side weakness    Patient Active Problem List   Diagnosis Date Noted  . Pulmonary nodule 10/24/2016  . Acute hypoxemic respiratory failure ([place]) 10/21/2016  . Sepsis ([place]) 10/21/2016  . DVT (deep venous thrombosis) ([place]) 10/12/2016  . Supratherapeutic INR 10/12/2016  . Acute deep vein thrombosis (DVT) of popliteal vein of right lower extremity ([place]) 10/12/2016  . AKI (acute kidney injury) ([place]) 10/12/2016  . Fever 10/12/2016  . Leukocytosis 10/12/2016  . Bacteriuria 10/12/2016  . Deep vein thrombosis (DVT) ([place]) 03/02/2015  . Lupus anticoagulant disorder ([place]) 03/02/2015  . Ischemic stroke  ([place]) 10/04/2014  . 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 Deep vein thrombosis ([place]) 10/04/2014  . 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 LA (lupus anticoagulant) disorder ([place]) 10/04/2014  . Arteriosclerosis of coronary artery 09/10/2012  . Apnea, sleep 09/10/2012  . 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 Dermatophytic onychia 01/29/2012  . Spastic hemiplegia ([place]) 12/03/2011  . Hemiparesis, left ([place]) 10/31/2011  . Dysphonia 08/13/2011  . Idiopathic thrombocytopenic purpura ([place]) 08/12/2011  . ITP (idiopathic thrombocytopenic purpura) 08/12/2011    Past Surgical History:  Procedure Laterality Date  . blood clot Right    surgical removal  . CHOLECYSTECTOMY    . CORONARY ANGIOPLASTY  1992  . SPLENECTOMY, TOTAL    . vertebralplasty         No family history on file.  Social History   Tobacco Use  . Smoking status: Former Smoker    Packs/day: 1.00    Years: 15.00    Pack years: 15.00    Types: Cigarettes    Start date: 06/12/1968    Quit date: 01/27/1985    Years since quitting: 34.5  . Smokeless tobacco: Never Used  . Tobacco comment: quit smoking 28 yeras ago  Substance Use Topics  . Alcohol use: No    Alcohol/week: 0.0 standard drinks  . Drug use: Not on file    Home Medications Prior to Admission medications  Medication Sig Start Date End Date Taking? Authorizing Provider  acetaminophen (TYLENOL) 325 MG tablet Take by mouth every morning.    [provider]  amLODipine (NORVASC) 5 MG tablet daily.  02/26/17   [provider]  aspirin 81 MG chewable tablet Chew 1 tablet (81 mg total) by mouth at bedtime. 10/23/16   Charlynne Cousins, MD  buPROPion (WELLBUTRIN XL) 150 MG 24 hr tablet Take 150 mg by mouth daily.  12/19/14   [provider]  Cholecalciferol (VITAMIN D3) 1000 units CAPS Take 2,000 Units by mouth 2 (two) times daily after a meal.     [provider]  ciprofloxacin-dexamethasone (CIPRODEX) OTIC  suspension Place 4 drops into the right ear 2 (two) times daily for 7 days. 08/07/19 08/14/19  Malvin Johns, MD  CRESTOR 20 MG tablet Take 20 mg by mouth at bedtime.  11/16/14   [provider]  dexamethasone (DECADRON) 4 MG tablet TAKE 10 TABLETS BY MOUTH ONCE DAILY AS NEEDED FOR 3 DAYS FOR  ITP 06/02/19   Volanda Napoleon, MD  fluticasone (FLONASE) 50 MCG/ACT nasal spray Place 2 sprays into both nostrils daily as needed for allergies or rhinitis.  06/23/14   [provider]  HYDROcodone-acetaminophen (NORCO) 10-325 MG tablet Take 1 tablet by mouth 3 (three) times daily as needed. 07/08/19   [provider]  HYDROcodone-acetaminophen (NORCO/VICODIN) 5-325 MG tablet Take 2 tablets by mouth every 6 (six) hours as needed for moderate pain.     [provider]  levETIRAcetam (KEPPRA) 500 MG tablet Take 1,500 mg by mouth 2 (two) times daily. 3 tabs  in am and 3 tabs in pm 12/24/11   [provider]  Melatonin 1 MG TABS Take by mouth at bedtime as needed for sleep.    [provider]  Multiple Vitamins-Minerals (MULTIVITAMIN WITH MINERALS) tablet Take 1 tablet by mouth 2 (two) times daily.     [provider]  NYSTATIN powder  03/13/17   [provider]  omeprazole (PRILOSEC) 20 MG capsule Take 20 mg by mouth daily.    [provider]  omeprazole (PRILOSEC) 40 MG capsule Take 40 mg by mouth every morning. 08/01/19   [provider]  polyethylene glycol (MIRALAX / GLYCOLAX) packet Take 17 g by mouth daily as needed. 10/25/16   Domenic Polite, MD  rivaroxaban Alveda Reasons) 20 MG TABS tablet Take 1 tablet by mouth once daily with breakfast 06/02/19   Volanda Napoleon, MD  sertraline (ZOLOFT) 100 MG tablet Take 200 mg by mouth daily.     [provider]  Tamsulosin HCl (FLOMAX) 0.4 MG CAPS Take 0.8 mg by mouth at bedtime.     [provider]    Allergies    Patient has no known allergies.  Review of Systems     Review of Systems  Constitutional: Negative for chills, diaphoresis, fatigue and fever.  HENT: Negative for congestion, ear pain, rhinorrhea and sneezing.        Bleeding from ear  Eyes: Negative.   Respiratory: Negative for cough, chest tightness and shortness of breath.   Cardiovascular: Negative for chest pain and leg swelling.  Gastrointestinal: Negative for abdominal pain, blood in stool, diarrhea, nausea and vomiting.  Genitourinary: Negative for difficulty urinating, flank pain, frequency and hematuria.  Musculoskeletal: Negative for arthralgias and back pain.  Skin: Negative for rash.  Neurological: Negative for dizziness, speech difficulty, weakness, numbness and headaches.    Physical Exam Updated Vital Signs BP  136/81 (BP Location: Right Arm)   Pulse 72   Temp 98.1 F (36.7 C) (Oral)   Resp 18   SpO2 96%   Physical Exam Constitutional:      Appearance: He is well-developed.  HENT:     Head: Normocephalic and atraumatic.     Ears:     Comments: Blood clot in right ear canal. Eyes:     Pupils: Pupils are equal, round, and reactive to light.  Cardiovascular:     Rate and Rhythm: Normal rate and regular rhythm.     Heart sounds: Normal heart sounds.  Pulmonary:     Effort: Pulmonary effort is normal. No respiratory distress.     Breath sounds: Normal breath sounds. No wheezing or rales.  Chest:     Chest wall: No tenderness.  Abdominal:     General: Bowel sounds are normal.     Palpations: Abdomen is soft.     Tenderness: There is no abdominal tenderness. There is no guarding or rebound.  Musculoskeletal:        General: Normal range of motion.     Cervical back: Normal range of motion and neck supple.  Lymphadenopathy:     Cervical: No cervical adenopathy.  Skin:    General: Skin is warm and dry.     Findings: No rash.  Neurological:     Mental Status: He is alert and oriented to person, place, and time.     Comments: Chronic left side weakness      ED Results / Procedures / Treatments   Labs (all labs ordered are listed, but only abnormal results are displayed) Labs Reviewed  CBC WITH DIFFERENTIAL/PLATELET - Abnormal; Notable for the following components:      Result Value   WBC 11.5 (*)    Monocytes Absolute 1.5 (*)    All other components within normal limits    EKG None  Radiology CT Head Wo Contrast  Result Date: 08/07/2019 CLINICAL DATA:  Bleeding from ear, headache, anticoagulated EXAM: CT HEAD WITHOUT CONTRAST TECHNIQUE: Contiguous axial images were obtained from the base of the skull through the vertex without intravenous contrast. COMPARISON:  07/02/2017 FINDINGS: Brain: There is a large area of encephalomalacia within the right parietal lobe consistent with previous infarct. No signs of acute infarct or hemorrhage. Ex vacuo dilatation of the right lateral ventricle is stable. Midline structures are unremarkable. No acute extra-axial fluid collections. No mass effect. Vascular: No hyperdense vessel or unexpected calcification. Skull: Normal. Negative for fracture or focal lesion. Sinuses/Orbits: Sinuses are clear. Mastoid air cells are normally aerated. Other: There is fluid within the right middle ear. No soft tissue abnormalities. IMPRESSION: 1. No acute intracranial abnormality. 2. Stable encephalomalacia within the right parietal lobe consistent with previous infarct. 3. Fluid within the right middle ear. Correlate with signs of otitis media. Electronically Signed   By: Randa Ngo M.D.   On: 08/07/2019 18:17    Procedures Procedures (including critical care time)  Medications Ordered in ED Medications  oxymetazoline (AFRIN) 0.05 % nasal spray 1 spray (has no administration in time range)    ED Course  I have reviewed the triage vital signs and the nursing notes.  Pertinent labs & imaging results that were available during my care of the patient were reviewed by me and considered in my medical decision  making (see chart for details).    MDM Rules/Calculators/A&P  Patient is a 74 year old male who presents with bleeding from his right ear.  There was a large blood clot in his ear on my exam.  I was able to suction it out and irrigate the canal.  I did not see any source of bleeding in the canal.  It appears that there is blood behind the TM with leakage of blood at the base of the TM.  Given his headache on Xarelto, head CT was performed which showed no evidence of acute abnormality.  There was fluid in the middle ear which is consistent with my exam.  I spoke with Dr. Constance Holster with ENT.  He recommends putting an Afrin-soaked cotton ball in the ear and starting the patient on Ciprodex eardrops.  He will follow-up with ENT on Monday.  They were instructed to call Monday morning to make an appointment.  Return precautions were given.  At this point there is not any active significant bleeding. Final Clinical Impression(s) / ED Diagnoses Final diagnoses:  Hemotympanum, right    Rx / DC Orders ED Discharge Orders         Ordered    ciprofloxacin-dexamethasone (CIPRODEX) OTIC suspension  2 times daily     08/07/19 1851           Malvin Johns, MD 08/07/19 1853

## 2019-08-09 ENCOUNTER — Telehealth: Payer: Self-pay | Admitting: *Deleted

## 2019-08-09 DIAGNOSIS — H66011 Acute suppurative otitis media with spontaneous rupture of ear drum, right ear: Secondary | ICD-10-CM | POA: Diagnosis not present

## 2019-08-09 DIAGNOSIS — H6121 Impacted cerumen, right ear: Secondary | ICD-10-CM | POA: Diagnosis not present

## 2019-08-09 DIAGNOSIS — H903 Sensorineural hearing loss, bilateral: Secondary | ICD-10-CM | POA: Diagnosis not present

## 2019-08-09 NOTE — Telephone Encounter (Signed)
Call received from patient's wife to inform Dr. Marin Olp that pt was in the ER on Saturday for bleeding in his ear, was told to f/u with an ENT today and would like Dr. Antonieta Pert opinion.  Dr. Marin Olp notified.  Call placed back to patient's wife, Mark [name] to notify her that Dr. Marin Olp agrees with pt seeing an ENT today.  [place] appreciative of call back and has no further questions at this time.

## 2019-08-14 ENCOUNTER — Other Ambulatory Visit: Payer: Self-pay | Admitting: Hematology & Oncology

## 2019-08-14 DIAGNOSIS — D6862 Lupus anticoagulant syndrome: Secondary | ICD-10-CM

## 2019-08-14 DIAGNOSIS — I824Y9 Acute embolism and thrombosis of unspecified deep veins of unspecified proximal lower extremity: Secondary | ICD-10-CM

## 2019-08-24 DIAGNOSIS — I129 Hypertensive chronic kidney disease with stage 1 through stage 4 chronic kidney disease, or unspecified chronic kidney disease: Secondary | ICD-10-CM | POA: Diagnosis not present

## 2019-08-24 DIAGNOSIS — N183 Chronic kidney disease, stage 3 unspecified: Secondary | ICD-10-CM | POA: Diagnosis not present

## 2019-08-24 DIAGNOSIS — R946 Abnormal results of thyroid function studies: Secondary | ICD-10-CM | POA: Diagnosis not present

## 2019-08-24 DIAGNOSIS — E785 Hyperlipidemia, unspecified: Secondary | ICD-10-CM | POA: Diagnosis not present

## 2019-08-24 DIAGNOSIS — E559 Vitamin D deficiency, unspecified: Secondary | ICD-10-CM | POA: Diagnosis not present

## 2019-08-25 DIAGNOSIS — L853 Xerosis cutis: Secondary | ICD-10-CM | POA: Diagnosis not present

## 2019-08-25 DIAGNOSIS — G40909 Epilepsy, unspecified, not intractable, without status epilepticus: Secondary | ICD-10-CM | POA: Diagnosis not present

## 2019-08-25 DIAGNOSIS — R69 Illness, unspecified: Secondary | ICD-10-CM | POA: Diagnosis not present

## 2019-08-25 DIAGNOSIS — K219 Gastro-esophageal reflux disease without esophagitis: Secondary | ICD-10-CM | POA: Diagnosis not present

## 2019-08-25 DIAGNOSIS — E785 Hyperlipidemia, unspecified: Secondary | ICD-10-CM | POA: Diagnosis not present

## 2019-08-25 DIAGNOSIS — R911 Solitary pulmonary nodule: Secondary | ICD-10-CM | POA: Diagnosis not present

## 2019-08-25 DIAGNOSIS — B372 Candidiasis of skin and nail: Secondary | ICD-10-CM | POA: Diagnosis not present

## 2019-08-25 DIAGNOSIS — I69359 Hemiplegia and hemiparesis following cerebral infarction affecting unspecified side: Secondary | ICD-10-CM | POA: Diagnosis not present

## 2019-08-25 DIAGNOSIS — I129 Hypertensive chronic kidney disease with stage 1 through stage 4 chronic kidney disease, or unspecified chronic kidney disease: Secondary | ICD-10-CM | POA: Diagnosis not present

## 2019-08-25 DIAGNOSIS — I2510 Atherosclerotic heart disease of native coronary artery without angina pectoris: Secondary | ICD-10-CM | POA: Diagnosis not present

## 2019-08-26 DIAGNOSIS — H903 Sensorineural hearing loss, bilateral: Secondary | ICD-10-CM | POA: Diagnosis not present

## 2019-08-30 ENCOUNTER — Other Ambulatory Visit: Payer: Self-pay | Admitting: Family Medicine

## 2019-08-30 DIAGNOSIS — R911 Solitary pulmonary nodule: Secondary | ICD-10-CM

## 2019-09-01 ENCOUNTER — Other Ambulatory Visit: Payer: Medicare HMO

## 2019-09-06 ENCOUNTER — Other Ambulatory Visit: Payer: Medicare HMO

## 2019-09-16 ENCOUNTER — Other Ambulatory Visit: Payer: Self-pay | Admitting: Hematology & Oncology

## 2019-09-16 DIAGNOSIS — I824Y9 Acute embolism and thrombosis of unspecified deep veins of unspecified proximal lower extremity: Secondary | ICD-10-CM

## 2019-09-16 DIAGNOSIS — D6862 Lupus anticoagulant syndrome: Secondary | ICD-10-CM

## 2019-09-20 ENCOUNTER — Other Ambulatory Visit: Payer: Medicare HMO

## 2019-09-29 DIAGNOSIS — R8271 Bacteriuria: Secondary | ICD-10-CM | POA: Diagnosis not present

## 2019-10-05 DIAGNOSIS — L0390 Cellulitis, unspecified: Secondary | ICD-10-CM | POA: Diagnosis not present

## 2019-10-05 DIAGNOSIS — L97929 Non-pressure chronic ulcer of unspecified part of left lower leg with unspecified severity: Secondary | ICD-10-CM | POA: Diagnosis not present

## 2019-10-06 ENCOUNTER — Other Ambulatory Visit: Payer: Medicare HMO

## 2019-10-11 DIAGNOSIS — N401 Enlarged prostate with lower urinary tract symptoms: Secondary | ICD-10-CM | POA: Diagnosis not present

## 2019-10-11 DIAGNOSIS — N312 Flaccid neuropathic bladder, not elsewhere classified: Secondary | ICD-10-CM | POA: Diagnosis not present

## 2019-10-11 DIAGNOSIS — R3914 Feeling of incomplete bladder emptying: Secondary | ICD-10-CM | POA: Diagnosis not present

## 2019-10-19 ENCOUNTER — Other Ambulatory Visit: Payer: Medicare HMO

## 2019-10-20 ENCOUNTER — Other Ambulatory Visit: Payer: Medicare HMO

## 2019-10-21 ENCOUNTER — Other Ambulatory Visit: Payer: Self-pay | Admitting: Hematology & Oncology

## 2019-10-21 DIAGNOSIS — D6862 Lupus anticoagulant syndrome: Secondary | ICD-10-CM

## 2019-10-21 DIAGNOSIS — I824Y9 Acute embolism and thrombosis of unspecified deep veins of unspecified proximal lower extremity: Secondary | ICD-10-CM

## 2019-10-25 DIAGNOSIS — R339 Retention of urine, unspecified: Secondary | ICD-10-CM | POA: Diagnosis not present

## 2019-10-25 DIAGNOSIS — N319 Neuromuscular dysfunction of bladder, unspecified: Secondary | ICD-10-CM | POA: Diagnosis not present

## 2019-10-29 ENCOUNTER — Other Ambulatory Visit: Payer: Self-pay

## 2019-10-29 ENCOUNTER — Ambulatory Visit
Admission: RE | Admit: 2019-10-29 | Discharge: 2019-10-29 | Disposition: A | Discharge status: Home / Self Care | Payer: Medicare HMO | Source: Ambulatory Visit | Attending: Family Medicine | Admitting: Family Medicine

## 2019-10-29 DIAGNOSIS — R918 Other nonspecific abnormal finding of lung field: Secondary | ICD-10-CM | POA: Diagnosis not present

## 2019-10-29 DIAGNOSIS — R911 Solitary pulmonary nodule: Secondary | ICD-10-CM

## 2019-11-09 ENCOUNTER — Other Ambulatory Visit: Payer: Self-pay | Admitting: Family Medicine

## 2019-11-09 DIAGNOSIS — N281 Cyst of kidney, acquired: Secondary | ICD-10-CM

## 2019-11-16 ENCOUNTER — Other Ambulatory Visit: Payer: Medicare HMO

## 2019-11-22 DIAGNOSIS — N312 Flaccid neuropathic bladder, not elsewhere classified: Secondary | ICD-10-CM | POA: Diagnosis not present

## 2019-11-22 DIAGNOSIS — R3914 Feeling of incomplete bladder emptying: Secondary | ICD-10-CM | POA: Diagnosis not present

## 2019-11-25 ENCOUNTER — Other Ambulatory Visit: Payer: Medicare HMO

## 2019-11-30 ENCOUNTER — Other Ambulatory Visit: Payer: Medicare HMO

## 2019-12-02 DIAGNOSIS — M810 Age-related osteoporosis without current pathological fracture: Secondary | ICD-10-CM | POA: Diagnosis not present

## 2019-12-08 ENCOUNTER — Other Ambulatory Visit: Payer: Medicare HMO

## 2019-12-28 ENCOUNTER — Other Ambulatory Visit: Payer: Medicare HMO

## 2020-01-05 ENCOUNTER — Other Ambulatory Visit: Payer: Medicare HMO

## 2020-01-13 ENCOUNTER — Ambulatory Visit
Admission: RE | Admit: 2020-01-13 | Discharge: 2020-01-13 | Disposition: A | Discharge status: Home / Self Care | Payer: Medicare HMO | Source: Ambulatory Visit | Attending: Family Medicine | Admitting: Family Medicine

## 2020-01-13 DIAGNOSIS — N281 Cyst of kidney, acquired: Secondary | ICD-10-CM | POA: Diagnosis not present

## 2020-01-13 DIAGNOSIS — N1330 Unspecified hydronephrosis: Secondary | ICD-10-CM | POA: Diagnosis not present

## 2020-01-17 DIAGNOSIS — N319 Neuromuscular dysfunction of bladder, unspecified: Secondary | ICD-10-CM | POA: Diagnosis not present

## 2020-01-17 DIAGNOSIS — R339 Retention of urine, unspecified: Secondary | ICD-10-CM | POA: Diagnosis not present

## 2020-01-26 DIAGNOSIS — N130 Hydronephrosis with ureteropelvic junction obstruction: Secondary | ICD-10-CM | POA: Diagnosis not present

## 2020-01-26 DIAGNOSIS — N312 Flaccid neuropathic bladder, not elsewhere classified: Secondary | ICD-10-CM | POA: Diagnosis not present

## 2020-01-26 DIAGNOSIS — N3000 Acute cystitis without hematuria: Secondary | ICD-10-CM | POA: Diagnosis not present

## 2020-02-03 DIAGNOSIS — I700 Atherosclerosis of aorta: Secondary | ICD-10-CM | POA: Diagnosis not present

## 2020-02-03 DIAGNOSIS — M1611 Unilateral primary osteoarthritis, right hip: Secondary | ICD-10-CM | POA: Diagnosis not present

## 2020-02-03 DIAGNOSIS — N134 Hydroureter: Secondary | ICD-10-CM | POA: Diagnosis not present

## 2020-02-03 DIAGNOSIS — N1330 Unspecified hydronephrosis: Secondary | ICD-10-CM | POA: Diagnosis not present

## 2020-02-08 DIAGNOSIS — R3914 Feeling of incomplete bladder emptying: Secondary | ICD-10-CM | POA: Diagnosis not present

## 2020-02-08 DIAGNOSIS — N130 Hydronephrosis with ureteropelvic junction obstruction: Secondary | ICD-10-CM | POA: Diagnosis not present

## 2020-02-08 DIAGNOSIS — R8271 Bacteriuria: Secondary | ICD-10-CM | POA: Diagnosis not present

## 2020-02-09 ENCOUNTER — Other Ambulatory Visit: Payer: Self-pay | Admitting: Urology

## 2020-02-10 NOTE — Progress Notes (Signed)
DUE TO COVID-19 ONLY ONE VISITOR IS ALLOWED TO COME WITH YOU AND STAY IN THE WAITING ROOM ONLY DURING PRE OP AND PROCEDURE DAY OF SURGERY. THE 1 VISITOR  MAY VISIT WITH YOU AFTER SURGERY IN YOUR PRIVATE ROOM DURING VISITING HOURS ONLY!  YOU NEED TO HAVE A COVID 19 TEST ON_______ @_______ , THIS TEST MUST BE DONE BEFORE SURGERY,  COVID TESTING SITE [address] [place] [id number], IT IS ON THE RIGHT GOING OUT [address] APPROXIMATELY  2 MINUTES PAST [employer] ON THE RIGHT. ONCE YOUR COVID TEST IS COMPLETED,  PLEASE BEGIN THE QUARANTINE INSTRUCTIONS AS OUTLINED IN YOUR HANDOUT.                Mark [name]  02/10/2020   Your procedure is scheduled on: 02/17/2020    Report to [facility] Main  Entrance   Report to admitting at    [place] AM     Call this number if you have problems the morning of surgery [phone]    Remember: Do not eat food , candy gum or mints :After Midnight. You may have clear liquids from midnight until   0415am    CLEAR LIQUID DIET   Foods Allowed                                                                       Coffee and tea, regular and decaf                              Plain Jell-O any favor except red or purple                                            Fruit ices (not with fruit pulp)                                      Iced Popsicles                                     Carbonated beverages, regular and diet                                    Cranberry, grape and apple juices Sports drinks like Gatorade Lightly seasoned clear broth or consume(fat free) Sugar, honey syrup   _____________________________________________________________________    BRUSH YOUR TEETH MORNING OF SURGERY AND RINSE YOUR MOUTH OUT, NO CHEWING GUM CANDY OR MINTS.     Take these medicines the morning of surgery with A SIP OF WATER:   DO NOT TAKE ANY DIABETIC MEDICATIONS DAY OF YOUR SURGERY                               You may not  have any metal on your body  including hair pins and              piercings  Do not wear jewelry, make-up, lotions, powders or perfumes, deodorant             Do not wear nail polish on your fingernails.  Do not shave  48 hours prior to surgery.              Men may shave face and neck.   Do not bring valuables to the hospital. [place].  Contacts, dentures or bridgework may not be worn into surgery.  Leave suitcase in the car. After surgery it may be brought to your room.     Patients discharged the day of surgery will not be allowed to drive home. IF YOU ARE HAVING SURGERY AND GOING HOME THE SAME DAY, YOU MUST HAVE AN ADULT TO DRIVE YOU HOME AND BE WITH YOU FOR 24 HOURS. YOU MAY GO HOME BY TAXI OR UBER OR ORTHERWISE, BUT AN ADULT MUST ACCOMPANY YOU HOME AND STAY WITH YOU FOR 24 HOURS.  Name and phone number of your driver:  Special Instructions: N/A              Please read over the following fact sheets you were given: _____________________________________________________________________  [facility] - Preparing for Surgery Before surgery, you can play an important role.  Because skin is not sterile, your skin needs to be as free of germs as possible.  You can reduce the number of germs on your skin by washing with CHG (chlorahexidine gluconate) soap before surgery.  CHG is an antiseptic cleaner which kills germs and bonds with the skin to continue killing germs even after washing. Please DO NOT use if you have an allergy to CHG or antibacterial soaps.  If your skin becomes reddened/irritated stop using the CHG and inform your nurse when you arrive at Short Stay. Do not shave (including legs and underarms) for at least 48 hours prior to the first CHG shower.  You may shave your face/neck. Please follow these instructions carefully:  1.  Shower with CHG Soap the night before surgery and the  morning of Surgery.  2.  If you choose to wash your  hair, wash your hair first as usual with your  normal  shampoo.  3.  After you shampoo, rinse your hair and body thoroughly to remove the  shampoo.                           4.  Use CHG as you would any other liquid soap.  You can apply chg directly  to the skin and wash                       Gently with a scrungie or clean washcloth.  5.  Apply the CHG Soap to your body ONLY FROM THE NECK DOWN.   Do not use on face/ open                           Wound or open sores. Avoid contact with eyes, ears mouth and genitals (private parts).                       Wash face,  Development worker, international aid (private parts)  with your normal soap.             6.  Wash thoroughly, paying special attention to the area where your surgery  will be performed.  7.  Thoroughly rinse your body with warm water from the neck down.  8.  DO NOT shower/wash with your normal soap after using and rinsing off  the CHG Soap.                9.  Pat yourself dry with a clean towel.            10.  Wear clean pajamas.            11.  Place clean sheets on your bed the night of your first shower and do not  sleep with pets. Day of Surgery : Do not apply any lotions/deodorants the morning of surgery.  Please wear clean clothes to the hospital/surgery center.  FAILURE TO FOLLOW THESE INSTRUCTIONS MAY RESULT IN THE CANCELLATION OF YOUR SURGERY PATIENT SIGNATURE_________________________________  NURSE SIGNATURE__________________________________  ________________________________________________________________________

## 2020-02-11 ENCOUNTER — Encounter (HOSPITAL_COMMUNITY): Payer: Self-pay

## 2020-02-11 ENCOUNTER — Encounter (HOSPITAL_COMMUNITY)
Admission: RE | Admit: 2020-02-11 | Discharge: 2020-02-11 | Disposition: A | Discharge status: Home / Self Care | Payer: Medicare HMO | Source: Ambulatory Visit | Attending: Urology | Admitting: Urology

## 2020-02-11 ENCOUNTER — Other Ambulatory Visit: Payer: Self-pay

## 2020-02-11 DIAGNOSIS — Z8673 Personal history of transient ischemic attack (TIA), and cerebral infarction without residual deficits: Secondary | ICD-10-CM | POA: Insufficient documentation

## 2020-02-11 DIAGNOSIS — I10 Essential (primary) hypertension: Secondary | ICD-10-CM | POA: Insufficient documentation

## 2020-02-11 DIAGNOSIS — Z7901 Long term (current) use of anticoagulants: Secondary | ICD-10-CM | POA: Insufficient documentation

## 2020-02-11 DIAGNOSIS — Z7982 Long term (current) use of aspirin: Secondary | ICD-10-CM | POA: Diagnosis not present

## 2020-02-11 DIAGNOSIS — I2510 Atherosclerotic heart disease of native coronary artery without angina pectoris: Secondary | ICD-10-CM | POA: Diagnosis not present

## 2020-02-11 DIAGNOSIS — Z86718 Personal history of other venous thrombosis and embolism: Secondary | ICD-10-CM | POA: Insufficient documentation

## 2020-02-11 DIAGNOSIS — R569 Unspecified convulsions: Secondary | ICD-10-CM | POA: Diagnosis not present

## 2020-02-11 DIAGNOSIS — I252 Old myocardial infarction: Secondary | ICD-10-CM | POA: Insufficient documentation

## 2020-02-11 DIAGNOSIS — Z79899 Other long term (current) drug therapy: Secondary | ICD-10-CM | POA: Diagnosis not present

## 2020-02-11 DIAGNOSIS — Z87891 Personal history of nicotine dependence: Secondary | ICD-10-CM | POA: Diagnosis not present

## 2020-02-11 DIAGNOSIS — Z01818 Encounter for other preprocedural examination: Secondary | ICD-10-CM | POA: Diagnosis not present

## 2020-02-11 DIAGNOSIS — N1330 Unspecified hydronephrosis: Secondary | ICD-10-CM | POA: Insufficient documentation

## 2020-02-11 HISTORY — DX: Unspecified hearing loss, unspecified ear: H91.90

## 2020-02-11 HISTORY — DX: Lupus anticoagulant syndrome: D68.62

## 2020-02-11 HISTORY — DX: Impulsiveness: R45.87

## 2020-02-11 HISTORY — DX: Sleep apnea, unspecified: G47.30

## 2020-02-11 HISTORY — DX: Dysphagia, unspecified: R13.10

## 2020-02-11 LAB — COMPREHENSIVE METABOLIC PANEL
ALT: 21 U/L (ref 0–44)
AST: 17 U/L (ref 15–41)
Albumin: 4.4 g/dL (ref 3.5–5.0)
Alkaline Phosphatase: 74 U/L (ref 38–126)
Anion gap: 10 (ref 5–15)
BUN: 29 mg/dL — ABNORMAL HIGH (ref 8–23)
CO2: 24 mmol/L (ref 22–32)
Calcium: 9.4 mg/dL (ref 8.9–10.3)
Chloride: 106 mmol/L (ref 98–111)
Creatinine, Ser: 1.16 mg/dL (ref 0.61–1.24)
GFR, Estimated: >60 mL/min (ref >60)
Glucose, Bld: 90 mg/dL (ref 70–99)
Potassium: 4.6 mmol/L (ref 3.5–5.1)
Sodium: 140 mmol/L (ref 135–145)
Total Bilirubin: 0.7 mg/dL (ref 0.3–1.2)
Total Protein: 7.7 g/dL (ref 6.5–8.1)

## 2020-02-11 LAB — CBC
HCT: 41.9 % (ref 39.0–52.0)
Hemoglobin: 13.8 g/dL (ref 13.0–17.0)
MCH: 30.4 pg (ref 26.0–34.0)
MCHC: 32.9 g/dL (ref 30.0–36.0)
MCV: 92.3 fL (ref 80.0–100.0)
Platelets: 281 10*3/uL (ref 150–400)
RBC: 4.54 MIL/uL (ref 4.22–5.81)
RDW: 14.6 % (ref 11.5–15.5)
WBC: 12.2 10*3/uL — ABNORMAL HIGH (ref 4.0–10.5)
nRBC: 0 % (ref 0.0–0.2)

## 2020-02-11 NOTE — Progress Notes (Addendum)
Anesthesia Review:  PCP: Harlan Stains- appt on 02/15/2020 [place])  Cardiologist : DR Dorothe Pea [facility]- 12/12/2017 [facility] - last appt few monhts ago  Neuro- Dr Theone Murdoch- [facility]- 01/20/2018  Chest x-ray : CT chest- 11/01/2019  EKG :02/11/2020  Echo : 2018 Stress test: Cardiac Cath :  Activity level: stroke on left side , difficulty swallowing- applesauce to swallow pills  Sleep Study/ CPAP :does have sleep apnea- cpap broken x 1 year per wife  Fasting Blood Sugar :      / Checks Blood Sugar -- times a day:   Blood Thinner/ Instructions /Last Dose: Xarelto- per wife DR Louis Meckel told pt he did not have to stop for surgery  ASA / Instructions/ Last Dose :  AT time of preop appt consulted with Willeen Cass, Anesthesia NP in regards that pt takes ills with applesauce and medical history.  Patient is to take no meds am of surgery.  Wife to bring Keppra in in labeled bottle and pt to take after surgery with applesauce.  Wife is aware and voices understanding.  REst of am meds will be taken once returned home.  CBC and CMp done 02/11/2020 faxed via epic to Dr Louis Meckel.  On 02/11/2020 called office of [facility] and spoke with [place]onna result from 02/11/2020 is in documents and Dr Louis Meckel is in the office today and she will make sure he sees results.

## 2020-02-14 ENCOUNTER — Other Ambulatory Visit (HOSPITAL_COMMUNITY)
Admission: RE | Admit: 2020-02-14 | Discharge: 2020-02-14 | Disposition: A | Discharge status: Home / Self Care | Payer: Medicare HMO | Source: Ambulatory Visit | Attending: Urology | Admitting: Urology

## 2020-02-14 DIAGNOSIS — Z20822 Contact with and (suspected) exposure to covid-19: Secondary | ICD-10-CM | POA: Diagnosis not present

## 2020-02-14 DIAGNOSIS — Z01812 Encounter for preprocedural laboratory examination: Secondary | ICD-10-CM | POA: Diagnosis not present

## 2020-02-15 LAB — SARS CORONAVIRUS 2 (TAT 6-24 HRS): SARS Coronavirus 2: NEGATIVE

## 2020-02-15 NOTE — Progress Notes (Signed)
Anesthesia Chart Review   Case: [id number] Date/Time: 02/17/20 0713   Procedures:      CYSTOGRAM (N/A )     BILATERALRETROGRADE PYELOGRAM,BILATERAL  URETEROSCOPY AND STENT PLACEMENT (Bilateral )   Anesthesia type: General   Pre-op diagnosis: BILATERAL HYDRONEPHROSIS   Location: [facility] PROCEDURE ROOM / [facility]   Surgeons: Ardis Hughs, MD      DISCUSSION:74 y.o. former smoker (15 pack years, quit 01/27/85) with h/o HTN, DVT, CAD (remote h/o MI, follows with PCP), stroke with residual left sided weakness, positive lupus anticoagulant, seizures managed on Keppra, sleep apnea, chronic ITP (platelets 281 on 02/11/2020, followed by Dr. Marin Olp), bilateral hydronephrosis scheduled for above procedure 02/17/20 with Dr. Louis Meckel.   Pt last seen by PCP 08/25/2019.  Stable at this visit.   VS: BP 121/72   Pulse 67   Temp 36.5 C (Axillary)   Resp 18   Ht 5\' 10"  (1.778 m)   Wt 87.5 kg   SpO2 95%   BMI 27.69 kg/m   PROVIDERS: Harlan Stains, MD is PCP    LABS: Labs reviewed: Acceptable for surgery. (all labs ordered are listed, but only abnormal results are displayed)  Labs Reviewed  CBC - Abnormal; Notable for the following components:      Result Value   WBC 12.2 (*)    All other components within normal limits  COMPREHENSIVE METABOLIC PANEL - Abnormal; Notable for the following components:   BUN 29 (*)    All other components within normal limits     IMAGES:   EKG: 02/11/2020 Rate 66 bpm  Sinus rhythm with 1st degree A-V block Otherwise normal ECG since last tracing no significant change  CV: Echo 10/22/16 Study Conclusions   - Left ventricle: The cavity size was normal. Systolic function was  normal. The estimated ejection fraction was in the range of 55%  to 60%. Wall motion was normal; there were no regional wall  motion abnormalities. Left ventricular diastolic function  parameters were normal.  - Left atrium: The atrium was mildly dilated.   Past Medical History:  Diagnosis Date  . Chronic ITP (idiopathic thrombocytopenia) (HCC)   . Chronic ITP (idiopathic thrombocytopenia) (HCC)   . Coronary artery disease 1992   MI  . Difficulty swallowing    pt takes all meds wiht applesauce  . DVT (deep venous thrombosis) ([place])   . Hard of hearing    hearing aids   . 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 Hypertension   . 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   . Lupus anticoagulant disorder ([place])   . Lupus anticoagulant syndrome ([place])   . Multiple closed anterior-posterior compression fractures of pelvis ([place])   . Seizures ([place])    last seizure 10 years ago approx   . Sleep apnea    cpap broken x 1 year per wife   . Stroke [facility]) 02/27/2011   Left side weakness    Past Surgical History:  Procedure Laterality Date  . blood clot Right    surgical removal  . CHOLECYSTECTOMY    . CORONARY ANGIOPLASTY  1992  . SPLENECTOMY, TOTAL    . vertebralplasty      MEDICATIONS: . acetaminophen (TYLENOL) 325 MG tablet  . amLODipine (NORVASC) 5 MG tablet  . aspirin 81 MG chewable tablet  . aspirin EC 81 MG tablet  . buPROPion (WELLBUTRIN XL) 150 MG 24 hr tablet  . Cholecalciferol (VITAMIN D3) 125 MCG (5000 UT) TABS  . CRESTOR 20  MG tablet  . dexamethasone (DECADRON) 4 MG tablet  . fluticasone (FLONASE) 50 MCG/ACT nasal spray  . HYDROcodone-acetaminophen (NORCO) 10-325 MG tablet  . HYDROcodone-acetaminophen (NORCO/VICODIN) 5-325 MG tablet  . levETIRAcetam (KEPPRA) 500 MG tablet  . Melatonin 10 MG TABS  . Multiple Vitamins-Minerals (MULTIVITAMIN WITH MINERALS) tablet  . NYSTATIN powder  . omeprazole (PRILOSEC) 20 MG capsule  . omeprazole (PRILOSEC) 40 MG capsule  . polyethylene glycol (MIRALAX / GLYCOLAX) packet  . sertraline (ZOLOFT) 100 MG tablet  . Tamsulosin HCl (FLOMAX) 0.4 MG CAPS  . XARELTO 20 MG TABS tablet   No current facility-administered medications for this encounter.    Konrad Felix, PA-C [facility] Pre-Surgical Testing [phone]

## 2020-02-16 ENCOUNTER — Encounter (HOSPITAL_COMMUNITY): Payer: Self-pay | Admitting: Urology

## 2020-02-16 ENCOUNTER — Telehealth: Payer: Self-pay

## 2020-02-16 MED ORDER — GENTAMICIN SULFATE 40 MG/ML IJ SOLN
440.0000 mg | INTRAVENOUS | Status: AC
  Administered 2020-02-17: 440 mg via INTRAVENOUS
  Filled 2020-02-16: qty 11

## 2020-02-16 NOTE — Anesthesia Preprocedure Evaluation (Addendum)
Anesthesia Evaluation  Patient identified by MRN, date of birth, ID band Patient awake    Reviewed: Allergy & Precautions, NPO status , Patient's Chart, lab work & pertinent test results  Airway Mallampati: II  TM Distance: >3 FB Neck ROM: Full    Dental  (+) Teeth Intact, Dental Advisory Given   Pulmonary sleep apnea , former smoker,    Pulmonary exam normal        Cardiovascular hypertension, Pt. on medications + CAD   Rhythm:Regular Rate:Normal     Neuro/Psych Seizures -,  CVA negative psych ROS   GI/Hepatic GERD  Medicated,(+) Hepatitis -  Endo/Other  negative endocrine ROS  Renal/GU Renal InsufficiencyRenal disease     Musculoskeletal negative musculoskeletal ROS (+)   Abdominal Normal abdominal exam  (+)   Peds  Hematology negative hematology ROS (+)   Anesthesia Other Findings   Reproductive/Obstetrics                            Anesthesia Physical Anesthesia Plan  ASA: III  Anesthesia Plan: General   Post-op Pain Management:    Induction: Intravenous  PONV Risk Score and Plan: 3 and Ondansetron, Dexamethasone and Treatment may vary due to age or medical condition  Airway Management Planned: LMA  Additional Equipment: None  Intra-op Plan:   Post-operative Plan: Extubation in OR  Informed Consent: I have reviewed the patients History and Physical, chart, labs and discussed the procedure including the risks, benefits and alternatives for the proposed anesthesia with the patient or authorized representative who has indicated his/her understanding and acceptance.     Dental advisory given  Plan Discussed with: CRNA  Anesthesia Plan Comments:        Anesthesia Quick Evaluation

## 2020-02-16 NOTE — Telephone Encounter (Signed)
Mark Reef, RN received call from wife stating that pt is having cystoscopy tomorrow. They have been advised to hold Asa 81mg  but not Xarelto. Wife wishes to know Dr Antonieta Pert opinion related to holding anticoagulants.   Per Dr Marin Olp, he recommends holding both ASA and Xarelto today and tomorrow until after the procedure. Loraine verbalizes understanding using teach back. dph

## 2020-02-17 ENCOUNTER — Ambulatory Visit (HOSPITAL_COMMUNITY): Payer: Medicare HMO

## 2020-02-17 ENCOUNTER — Ambulatory Visit (HOSPITAL_COMMUNITY): Payer: Medicare HMO | Admitting: Anesthesiology

## 2020-02-17 ENCOUNTER — Ambulatory Visit (HOSPITAL_COMMUNITY): Payer: Medicare HMO | Admitting: Emergency Medicine

## 2020-02-17 ENCOUNTER — Ambulatory Visit (HOSPITAL_COMMUNITY)
Admission: RE | Admit: 2020-02-17 | Discharge: 2020-02-17 | Disposition: A | Discharge status: Home / Self Care | Payer: Medicare HMO | Source: Ambulatory Visit | Attending: Urology | Admitting: Urology

## 2020-02-17 ENCOUNTER — Encounter (HOSPITAL_COMMUNITY): Payer: Self-pay | Admitting: Urology

## 2020-02-17 ENCOUNTER — Encounter (HOSPITAL_COMMUNITY)
Admission: RE | Disposition: A | Discharge status: Home / Self Care | Payer: Self-pay | Source: Ambulatory Visit | Attending: Urology

## 2020-02-17 DIAGNOSIS — I69354 Hemiplegia and hemiparesis following cerebral infarction affecting left non-dominant side: Secondary | ICD-10-CM | POA: Diagnosis not present

## 2020-02-17 DIAGNOSIS — Z7901 Long term (current) use of anticoagulants: Secondary | ICD-10-CM | POA: Insufficient documentation

## 2020-02-17 DIAGNOSIS — Z79899 Other long term (current) drug therapy: Secondary | ICD-10-CM | POA: Insufficient documentation

## 2020-02-17 DIAGNOSIS — N401 Enlarged prostate with lower urinary tract symptoms: Secondary | ICD-10-CM | POA: Insufficient documentation

## 2020-02-17 DIAGNOSIS — N1330 Unspecified hydronephrosis: Secondary | ICD-10-CM | POA: Diagnosis not present

## 2020-02-17 DIAGNOSIS — R339 Retention of urine, unspecified: Secondary | ICD-10-CM | POA: Diagnosis present

## 2020-02-17 DIAGNOSIS — N319 Neuromuscular dysfunction of bladder, unspecified: Secondary | ICD-10-CM | POA: Insufficient documentation

## 2020-02-17 DIAGNOSIS — R3914 Feeling of incomplete bladder emptying: Secondary | ICD-10-CM | POA: Insufficient documentation

## 2020-02-17 DIAGNOSIS — Z87891 Personal history of nicotine dependence: Secondary | ICD-10-CM | POA: Diagnosis not present

## 2020-02-17 DIAGNOSIS — I10 Essential (primary) hypertension: Secondary | ICD-10-CM | POA: Diagnosis not present

## 2020-02-17 DIAGNOSIS — R338 Other retention of urine: Secondary | ICD-10-CM | POA: Insufficient documentation

## 2020-02-17 DIAGNOSIS — N320 Bladder-neck obstruction: Secondary | ICD-10-CM | POA: Insufficient documentation

## 2020-02-17 DIAGNOSIS — N1339 Other hydronephrosis: Secondary | ICD-10-CM

## 2020-02-17 DIAGNOSIS — K219 Gastro-esophageal reflux disease without esophagitis: Secondary | ICD-10-CM | POA: Diagnosis not present

## 2020-02-17 DIAGNOSIS — I2510 Atherosclerotic heart disease of native coronary artery without angina pectoris: Secondary | ICD-10-CM | POA: Diagnosis not present

## 2020-02-17 HISTORY — PX: CYSTOSCOPY WITH RETROGRADE PYELOGRAM, URETEROSCOPY AND STENT PLACEMENT: SHX5789

## 2020-02-17 HISTORY — PX: CYSTOGRAM: SHX6285

## 2020-02-17 SURGERY — CYSTOGRAM
Anesthesia: General

## 2020-02-17 MED ORDER — 0.9 % SODIUM CHLORIDE (POUR BTL) OPTIME
TOPICAL | Status: DC | PRN
  Administered 2020-02-17: 1000 mL

## 2020-02-17 MED ORDER — DEXAMETHASONE SODIUM PHOSPHATE 10 MG/ML IJ SOLN
INTRAMUSCULAR | Status: DC | PRN
  Administered 2020-02-17: 10 mg via INTRAVENOUS

## 2020-02-17 MED ORDER — PHENYLEPHRINE 40 MCG/ML (10ML) SYRINGE FOR IV PUSH (FOR BLOOD PRESSURE SUPPORT)
PREFILLED_SYRINGE | INTRAVENOUS | Status: AC
  Filled 2020-02-17: qty 10

## 2020-02-17 MED ORDER — PHENYLEPHRINE 40 MCG/ML (10ML) SYRINGE FOR IV PUSH (FOR BLOOD PRESSURE SUPPORT)
PREFILLED_SYRINGE | INTRAVENOUS | Status: DC | PRN
  Administered 2020-02-17 (×2): 80 ug via INTRAVENOUS

## 2020-02-17 MED ORDER — DIATRIZOATE MEGLUMINE 30 % UR SOLN
URETHRAL | Status: AC
  Filled 2020-02-17: qty 100

## 2020-02-17 MED ORDER — DIATRIZOATE MEGLUMINE 30 % UR SOLN
URETHRAL | Status: DC | PRN
  Administered 2020-02-17: 100 mL via URETHRAL

## 2020-02-17 MED ORDER — PHENAZOPYRIDINE HCL 200 MG PO TABS: 200.0000 mg | ORAL_TABLET | Freq: Three times a day (TID) | ORAL | 0 refills | Status: DC | PRN

## 2020-02-17 MED ORDER — BELLADONNA ALKALOIDS-OPIUM 16.2-30 MG RE SUPP
RECTAL | Status: AC
  Filled 2020-02-17: qty 1

## 2020-02-17 MED ORDER — PROPOFOL 10 MG/ML IV BOLUS
INTRAVENOUS | Status: AC
  Filled 2020-02-17: qty 20

## 2020-02-17 MED ORDER — PROPOFOL 10 MG/ML IV BOLUS
INTRAVENOUS | Status: DC | PRN
  Administered 2020-02-17 (×2): 30 mg via INTRAVENOUS
  Administered 2020-02-17: 140 mg via INTRAVENOUS

## 2020-02-17 MED ORDER — DEXAMETHASONE SODIUM PHOSPHATE 10 MG/ML IJ SOLN
INTRAMUSCULAR | Status: AC
  Filled 2020-02-17: qty 1

## 2020-02-17 MED ORDER — EPHEDRINE 5 MG/ML INJ
INTRAVENOUS | Status: AC
  Filled 2020-02-17: qty 10

## 2020-02-17 MED ORDER — LIDOCAINE HCL URETHRAL/MUCOSAL 2 % EX GEL
CUTANEOUS | Status: AC
  Filled 2020-02-17: qty 30

## 2020-02-17 MED ORDER — LIDOCAINE 2% (20 MG/ML) 5 ML SYRINGE
INTRAMUSCULAR | Status: AC
  Filled 2020-02-17: qty 5

## 2020-02-17 MED ORDER — ORAL CARE MOUTH RINSE: 15.0000 mL | Freq: Once | OROMUCOSAL | Status: AC

## 2020-02-17 MED ORDER — LACTATED RINGERS IV SOLN: INTRAVENOUS | Status: DC

## 2020-02-17 MED ORDER — FENTANYL CITRATE (PF) 250 MCG/5ML IJ SOLN
INTRAMUSCULAR | Status: DC | PRN
  Administered 2020-02-17 (×2): 50 ug via INTRAVENOUS

## 2020-02-17 MED ORDER — SODIUM CHLORIDE 0.9 % IR SOLN
Status: DC | PRN
  Administered 2020-02-17 (×3): 3000 mL

## 2020-02-17 MED ORDER — FENTANYL CITRATE (PF) 100 MCG/2ML IJ SOLN: 25.0000 ug | INTRAMUSCULAR | Status: DC | PRN

## 2020-02-17 MED ORDER — ONDANSETRON HCL 4 MG/2ML IJ SOLN
INTRAMUSCULAR | Status: AC
  Filled 2020-02-17: qty 2

## 2020-02-17 MED ORDER — EPHEDRINE SULFATE-NACL 50-0.9 MG/10ML-% IV SOSY
PREFILLED_SYRINGE | INTRAVENOUS | Status: DC | PRN
  Administered 2020-02-17 (×4): 10 mg via INTRAVENOUS

## 2020-02-17 MED ORDER — MIDAZOLAM HCL 2 MG/2ML IJ SOLN
INTRAMUSCULAR | Status: AC
  Filled 2020-02-17: qty 2

## 2020-02-17 MED ORDER — CHLORHEXIDINE GLUCONATE 0.12 % MT SOLN
15.0000 mL | Freq: Once | OROMUCOSAL | Status: AC
  Administered 2020-02-17: 15 mL via OROMUCOSAL

## 2020-02-17 MED ORDER — SODIUM CHLORIDE 0.9 % IV SOLN
INTRAVENOUS | Status: DC | PRN
  Administered 2020-02-17: 50 mL
  Administered 2020-02-17: 100 mL

## 2020-02-17 MED ORDER — LIDOCAINE 20MG/ML (2%) 15 ML SYRINGE OPTIME
INTRAMUSCULAR | Status: DC | PRN
  Administered 2020-02-17: 20 mg via INTRAVENOUS

## 2020-02-17 MED ORDER — ONDANSETRON HCL 4 MG/2ML IJ SOLN
INTRAMUSCULAR | Status: DC | PRN
  Administered 2020-02-17: 4 mg via INTRAVENOUS

## 2020-02-17 MED ORDER — FENTANYL CITRATE (PF) 100 MCG/2ML IJ SOLN
INTRAMUSCULAR | Status: AC
  Filled 2020-02-17: qty 2

## 2020-02-17 SURGICAL SUPPLY — 21 items
BAG URINE DRAIN 2000ML AR STRL (UROLOGICAL SUPPLIES) ×3 IMPLANT
BAG URO CATCHER STRL LF (MISCELLANEOUS) ×3 IMPLANT
BASKET ZERO TIP NITINOL 2.4FR (BASKET) IMPLANT
CATH FOLEY 2WAY SLVR  5CC 16FR (CATHETERS) ×3
CATH FOLEY 2WAY SLVR 5CC 16FR (CATHETERS) ×2 IMPLANT
CATH URET 5FR 28IN OPEN ENDED (CATHETERS) ×3 IMPLANT
CLOTH BEACON ORANGE TIMEOUT ST (SAFETY) ×3 IMPLANT
EXTRACTOR STONE 1.7FRX115CM (UROLOGICAL SUPPLIES) IMPLANT
GLOVE BIOGEL M STRL SZ7.5 (GLOVE) ×3 IMPLANT
GOWN STRL REUS W/TWL XL LVL3 (GOWN DISPOSABLE) ×3 IMPLANT
GUIDEWIRE ANG ZIPWIRE 038X150 (WIRE) ×3 IMPLANT
GUIDEWIRE STR DUAL SENSOR (WIRE) ×3 IMPLANT
KIT TURNOVER KIT A (KITS) IMPLANT
MANIFOLD NEPTUNE II (INSTRUMENTS) ×3 IMPLANT
PACK CYSTO (CUSTOM PROCEDURE TRAY) ×3 IMPLANT
SHEATH URETERAL 12FRX28CM (UROLOGICAL SUPPLIES) IMPLANT
SHEATH URETERAL 12FRX35CM (MISCELLANEOUS) IMPLANT
SYR TOOMEY IRRIG 70ML (MISCELLANEOUS) ×3
SYRINGE TOOMEY IRRIG 70ML (MISCELLANEOUS) ×2 IMPLANT
TUBING CONNECTING 10 (TUBING) ×3 IMPLANT
TUBING UROLOGY SET (TUBING) ×3 IMPLANT

## 2020-02-17 NOTE — Anesthesia Postprocedure Evaluation (Signed)
Anesthesia Post Note  Patient: SAKAI WOLFORD  Procedure(s) Performed: CYSTOGRAM (N/A ) BILATERALRETROGRADE PYELOGRAM,BILATERAL  URETEROSCOPY AND STENT PLACEMENT (Bilateral )     Patient location during evaluation: PACU Anesthesia Type: General Level of consciousness: awake and alert Pain management: pain level controlled Vital Signs Assessment: post-procedure vital signs reviewed and stable Respiratory status: spontaneous breathing, nonlabored ventilation, respiratory function stable and patient connected to nasal cannula oxygen Cardiovascular status: blood pressure returned to baseline and stable Postop Assessment: no apparent nausea or vomiting Anesthetic complications: no   No complications documented.  Last Vitals:  Vitals:   02/17/20 1000 02/17/20 1030  BP: 138/74 (!) 148/84  Pulse: 70 77  Resp: 14 18  Temp:  36.7 C  SpO2: 95% 96%    Last Pain:  Vitals:   02/17/20 1030  TempSrc: Oral  PainSc: 0-No pain                 Effie Berkshire

## 2020-02-17 NOTE — Discharge Instructions (Signed)
DISCHARGE INSTRUCTIONS FOR Cystoscopy/Ureteroscopy  MEDICATIONS:  1.  Resume all your other meds from home - except do not take any extra narcotic pain meds that you may have at home.  2. Pyridium is to help with the burning/stinging when you urinate.   ACTIVITY:  1. No strenuous activity x 1week  2. No driving while on narcotic pain medications  3. Drink plenty of water  4. Continue to walk at home - you can still get blood clots when you are at home, so keep active, but don't over do it.  5. May return to work/school tomorrow or when you feel ready   BATHING:  1. You can shower and we recommend daily showers   SIGNS/SYMPTOMS TO CALL:  Please call us if you have a fever greater than 101.5, uncontrolled nausea/vomiting, uncontrolled pain, dizziness, unable to urinate, bloody urine, chest pain, shortness of breath, leg swelling, leg pain, redness around wound, drainage from wound, or any other concerns or questions.   You can reach [place] at [phone].   FOLLOW-UP:  1. You have an appointment in 2 weeks.

## 2020-02-17 NOTE — H&P (Signed)
74 year old male who presents today for worsening leakage. He has noted worsening leakage at night and when he sleeping. Throughout the day he seems to be okay and without with minimal leakage, but also was not voiding often. He has been cathing once daily at night.   The patient has a history of BPH and difficulty emptying his bladder. The patient takes tamsulosin 0.8 mg + 25 mg of bethanechol. 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 He was placed on CIC once at night several months ago.   Patient has a history of several strokes over the course of the last 5 years. He has had significant left-sided weakness.   Patient is no longer taking tamsulosin or bethanechol.   10/19: Patient presents today after having an ultrasound performed by his primary care provider that demonstrated bilateral hydronephrosis. The patient states that he has been having some bilateral back pain. He has also had a slight fever. Continues to cath once a night. He voids several times throughout the day. He has not felt well recently.   Interval: Today the patient presents after undergoing a CT scan. This demonstrated hydronephrosis down to his bladder. His bladder was distended. He has prostatamegaly.  The up fortunately there was no evidence of obstruction. We discussed at the patient's last follow-up the idea of cathing more frequently to ensure that his bladder is emptying.     ALLERGIES: No Allergies    MEDICATIONS: Alendronate Sodium 70 mg tablet Oral  Amantadine 100 mg capsule Oral  Amlodipine Besylate 2.5 mg tablet Oral  Amoxicillin 500 mg tablet  Bethanechol Chloride 25 mg tablet Oral  Bupropion Xl 150 mg tablet, extended release 24 hr Oral  Crestor 20 mg tablet Oral  Dexamethasone 4 MG Oral Tablet 0 Oral  Famotidine 40 mg tablet Oral  Levetiracetam 500 mg tablet Oral  Multi-Day tablet Oral  Sertraline Hcl 50 mg tablet Oral  Tamsulosin HCl - 0.4 MG Oral Capsule 0 Oral  Vitamin D3 50 mcg (2,000  unit) tablet Oral  Vitamin K 100 mcg tablet Oral  Warfarin Sodium 2 mg tablet Oral     GU PSH: Catheterization For Collection Of Specimen, Single Patient, All Places Of Service - 01/26/2020, 2018 Catheterize For Residual - 10/11/2019, 04/14/2019 Locm 300-399Mg/Ml Iodine,1Ml - 02/03/2020       PSH Notes: Total Splenectomy, Gallbladder Surgery   NON-GU PSH: Splenectomy - 2013     GU PMH: Hydronephrosis - 02/03/2020, - 01/26/2020 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 - 04/05/2019 Nocturnal Enuresis, His nocturnal enuresis has now resolved with the stopping of his tamsulosin. He has not noticed any worsening of his obstructive symptoms. - 01/12/2019 Incomplete bladder emptying (Worsening), He has a history of incomplete bladder emptying but actually is doing better now. He carries a small residual relative to what he used to. - 2017 Bladder, Neuromuscular dysfunction, Unspec, Neurogenic bladder - 2016 BPH w/LUTS, Benign prostatic hyperplasia with urinary obstruction - 2016 Nocturia, Nocturia - 2016 Urinary Retention, Unspec, Incomplete bladder emptying - 2016, Acute Urinary Retention, - 2014 Primary hypogonadism, Hypogonadism - 2014      PMH Notes: BPH with outlet obstruction and a history of urinary retention: The patient had a CVA in 11/12. While he was in the hospital in [place] he was having his PVR checked and indicated that he required  in and out catheterization nearly the entire time that he was there. They were doing bladder scans and were finding PVRs between 400 and 900 cc. He began voiding spontaneously but he really does not have much of an urge or sensation to urinate. He did try timed voidings about every 2 hours. Despite this he continued to have nocturia 3-6 times. He has a past history of having some BPH  and also has a habit of holding his urine and only urinating about twice a day when it is convenient for him. He denies any incontinence. He's not had any hematuria.  Urodynamics 1/13: He was found to have a large capacity (>800cc), stable bladder with very weak detrusor contractility requiring strong abdominal straining to empty and an elevated PVR (300 cc).  Previous treatment: Tamsulosin   Elevated PSA: He was found to have a PSA of 4.42 in 1/13 but had not had previous PSAs for comparison. His DRE was noted to be benign     NON-GU PMH: Encounter for general adult medical examination without abnormal findings, Encounter for preventive health examination - 31-May-2014 Personal history of other diseases of the circulatory system, History of hypertension - 05-31-2012 Personal history of other diseases of the digestive system, History of esophageal reflux - 31-May-2012 Personal history of other diseases of the nervous system and sense organs, History of seizure disorder - 2012/05/31, History of sleep apnea, - 05-31-12, History of sleep apnea, - May 31, 2012 Personal history of other endocrine, nutritional and metabolic disease, History of hypercholesterolemia - 2012-05-31 Personal history of other infectious and parasitic diseases, History of hepatitis - 05-31-12 Personal history of other mental and behavioral disorders, History of depression - 05/31/12    FAMILY HISTORY: Death In The Family Father - Runs In Family Family Health Status - Mother's Age - Runs In Family Family Health Status Children _4__ Living Daughter - Runs In Family No pertinent family history - Other Osteogenesis Imperfecta - Runs In Family   SOCIAL HISTORY: Marital Status: Married Ethnicity: Not Hispanic Or Latino; Race: White Current Smoking Status: Patient has never smoked.  Types of alcohol consumed: Wine. Social Drinker.  Drinks 2 caffeinated drinks per day. Patient's occupation is/was retired.     Notes: Former smoker, Marital History - Currently Married, Caffeine  Use, Alcohol Use   REVIEW OF SYSTEMS:    GU Review Male:   Patient denies frequent urination, hard to postpone urination, burning/ pain with urination, get up at night to urinate, leakage of urine, stream starts and stops, trouble starting your stream, have to strain to urinate , erection problems, and penile pain.  Gastrointestinal (Upper):   Patient denies nausea, vomiting, and indigestion/ heartburn.  Gastrointestinal (Lower):   Patient denies diarrhea and constipation.  Constitutional:   Patient denies fever, night sweats, weight loss, and fatigue.  Skin:   Patient denies skin rash/ lesion and itching.  Eyes:   Patient denies double vision and blurred vision.  Ears/ Nose/ Throat:   Patient denies sore throat and sinus problems.  Hematologic/Lymphatic:   Patient denies swollen glands and easy bruising.  Cardiovascular:   Patient denies leg swelling and chest pains.  Respiratory:   Patient denies cough and shortness of breath.  Endocrine:   Patient denies excessive thirst.  Musculoskeletal:   Patient reports joint pain. Patient denies back pain.  Neurological:   Patient denies headaches and dizziness.  Psychologic:   Patient denies depression and anxiety.   VITAL SIGNS:      02/08/2020 12:35 PM  Weight 193 lb / 87.54 kg  Height 70 in / 177.8 cm  BP 124/74 mmHg  Heart Rate 66 /min  Temperature 98.9 F / 37.1 C  BMI 27.7 kg/m   Complexity of Data:  Source Of History:  Patient  Records Review:   Previous Doctor Records, Previous Patient Records  Urine Test Review:   Urinalysis  X-Ray Review: Renal Ultrasound: Reviewed Films. Discussed With Patient.  C.T. Abdomen/Pelvis: Reviewed Films. Discussed With Patient.     04/05/19 03/10/17 01/05/16 12/07/14 11/14/11 04/23/11  PSA  Total PSA 1.25 ng/mL 0.78 ng/mL 0.60 ng/dl 0.79  0.56  4.24   Free PSA      1.12   % Free PSA      26     01/26/20 04/05/19  General Chemistry  Sodium 142 mEq/L   Potassium 4.3 mEq/L   BUN 33 mg/dL 35  mg/dL  Creatinine 1.0 mg/dL 1.3 mg/dL  Chloride 108 mEq/L   CO2 24 mEq/L   Glucose 102 mg/dL   Calcium 8.8 mg/dL   eGFR African American 85.6  62.7   eGFR Non-Afr. American 73.8  54.1   Urinalysis  Urine Appearance Slightly Cloudy    Urine Color Yellow    Urine Glucose Neg mg/dL   Urine Bilirubin Neg mg/dL   Urine Ketones Neg mg/dL   Urine Specific Gravity 1.020    Urine Blood Neg ery/uL   Urine pH 6.0    Urine Protein Neg mg/dL   Urine Urobilinogen 0.2 mg/dL   Urine Nitrites Neg    Urine Leukocyte Esterase 2+ leu/uL   Urine WBC/hpf 20 - 40/hpf    Urine RBC/hpf 0 - 2/hpf    Urine Epithelial Cells 0 - 5/hpf    Urine Bacteria Few (10-25/hpf)    Urine Mucous Not Present    Urine Yeast NS (Not Seen)    Urine Trichomonas Not Present    Urine Cystals NS (Not Seen)    Urine Casts NS (Not Seen)    Urine Sperm Not Present    Urine C&S  Culture, Cath Urine -    Other  ~Organism 1 -     PROCEDURES: None   ASSESSMENT:      ICD-10 Details  1 GU:   Incomplete bladder emptying - R39.14   2   Hydronephrosis - N13.0      PLAN:           Orders Labs Urine Culture CATH          Document Letter(s):  Created for Patient: Clinical Summary         Notes:   I discussed the patient's CT scan findings with him his wife in detail. 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 Looking back through the patient's office visits with some of my former partners, the patient has been having issues with urinary retention for more than 10 years. Fortunately, the patient's creatinine is normal.   We discussed several treatment options in management strategies. Initially we discussed the option of placing a Foley catheter and then repeating the patient's ultrasound in 2 weeks. Alternatively we could take the patient to the operating room put him to sleep, perform a cystogram to see if there is any reflux and  simultaneously perform bilateral retrograde pyelograms to evaluate the ureters and diagnostic ureteroscopy if necessary. We could also placed stents. The patient is opted to proceed to the operating room so that we can  do all the diagnostic evaluation at once. I do not think that he should have to stop his Xarelto for this as I do not anticipate there being a lot of bleeding. It does increase the risk of bleeding somewhat, but the risk of him is stopping the Xarelto seems significant enough to justify the risk of bleeding. Will try to get this done for the patient in the near future.

## 2020-02-17 NOTE — Interval H&P Note (Signed)
History and Physical Interval Note:  02/17/2020 7:28 AM  Pauline Good  has presented today for surgery, with the diagnosis of BILATERAL HYDRONEPHROSIS.  The various methods of treatment have been discussed with the patient and family. After consideration of risks, benefits and other options for treatment, the patient has consented to  Procedure(s): CYSTOGRAM (N/A) BILATERALRETROGRADE PYELOGRAM,BILATERAL  URETEROSCOPY AND STENT PLACEMENT (Bilateral) as a surgical intervention.  The patient's history has been reviewed, patient examined, no change in status, stable for surgery.  I have reviewed the patient's chart and labs.  Questions were answered to the patient's satisfaction.     Ardis Hughs

## 2020-02-17 NOTE — Transfer of Care (Signed)
Immediate Anesthesia Transfer of Care Note  Patient: Mark [name]  Procedure(s) Performed: CYSTOGRAM (N/A ) BILATERALRETROGRADE PYELOGRAM,BILATERAL  URETEROSCOPY AND STENT PLACEMENT (Bilateral )  Patient Location: PACU  Anesthesia Type:General  Level of Consciousness: awake, sedated and patient cooperative  Airway & Oxygen Therapy: Patient Spontanous Breathing and Patient connected to face mask oxygen  Post-op Assessment: Report given to RN and Post -op Vital signs reviewed and stable  Post vital signs: stable  Last Vitals:  Vitals Value Taken Time  BP    Temp    Pulse    Resp    SpO2      Last Pain:  Vitals:   02/17/20 0633  TempSrc:   PainSc: 0-No pain         Complications: No complications documented.

## 2020-02-17 NOTE — Op Note (Signed)
Preoperative diagnosis:  1. Bilateral hydroureteronephrosis 2. Urinary retention  Postoperative diagnosis:  1. Same  Procedure: 1. Cystogram under fluoroscopy 2. Cystoscopy, bilateral retrograde pyelogram with interpretation 3. Right diagnostic ureteroscopy  Surgeon: Ardis Hughs, MD  Anesthesia: General  Complications: None  Intraoperative findings:  #1: A Foley catheter was placed in the OR under anesthesia and 1200 cc was drained. #2: The bladder was filled with a mixture of Omnipaque, Cystografin, and saline and 1100 cc instilled passively.  The cystogram demonstrated trabeculated bladder with large capacity.  There was no ureteral reflux appreciated. #3: The right retrograde pyelogram demonstrated a narrowing at the UVJ and a very dilated and tortuous ureter with hydroureteronephrosis and a blunted collecting system. #4: The left retrograde pyelogram demonstrated a narrowing of the UVJ with torturous dilated ureter and blunted calyces. #5: The diagnostic ureteroscopy demonstrated a very dilated right ureter that was torturous and difficult to navigate.  I was able to ultimately visualize the entire ureter all the way down from the UPJ and there were no filling defects or significant abnormalities.  Presumably, the patient's bilateral hydroureteronephrosis all the way down the bladder secondary to bladder outlet obstruction and the thickening of the bladder wall.  EBL: Minimal  Specimens: None  Indication: Mark [name] is a 74 y.o. patient with bilateral hydroureteronephrosis and urinary retention.  After reviewing the management options for treatment, he elected to proceed with the above surgical procedure(s). We have discussed the potential benefits and risks of the procedure, side effects of the proposed treatment, the likelihood of the patient achieving the goals of the procedure, and any potential problems that might occur during the procedure or recuperation.  Informed consent has been obtained.  Description of procedure:  The patient was taken to the operating room and general anesthesia was induced.  The patient was placed in the dorsal lithotomy position, prepped and draped in the usual sterile fashion, and preoperative antibiotics were administered. A preoperative time-out was performed.   A 16 French Foley catheter was placed in the patient's urethra and approximately 1200 cc of urine was evacuated.  I then slowly backfilled the Foley catheter with 1100 cc of contrast mixed with saline.  Fluoroscopy was performed noting no reflux but a trabeculated ureter.  The above findings are addition noted.  I then remove the Foley catheter exchanged for 21 French 0 degree cystoscope.  Gently passed the scope to the patient's urethra and in the bladder under visual guidance.  The bladder was noted to be quite trabeculated and enlarged, but there was no tumors or any other abnormal mucosal findings.  The ureters were orthotopic.  I then performed a retrograde pyelogram on the patient's right side with the above findings.  I did have to advance a wire through the open-ended catheter in order to advance the catheter up into the proximal ureter given the tortuosity.  I was able to complete the retrograde pyelogram ultimately with the above findings.  I then navigated the collecting system with the semirigid ureteroscope alongside the wire.  In the proximal ureter I had difficulty getting through some of the the ureter because of the tortuosity and redundancy.  I removed the ureteroscope and then passed it back through the collecting system over the wire which made it possible for me to get all the way up to the UPJ.  Above findings were noted, notably there were no tumors or any other clear explanation for his hydronephrosis.  I slowly backed out the ureteroscope leaving  the wire in place.  I then exchanged the wire for an open-ended catheter and evacuated the fluid within the  collecting system and ureter.  I opted at this point not to leave a stent.  I repassed the 21 French cystoscope through the patient's urethra into the bladder.  I then performed a left retrograde pyelogram with the above findings.  In order to complete the retrograde I did have to advance a wire up through the tortuous ureter in order to get the open-ended catheter up into the UPJ.  Again, the above findings were noted.  At this point I opted not to perform ureteroscopy given that there were no filling defects or significant abnormalities along the ureter.  Presumably the patient's bilateral hydroureteronephrosis is from incomplete bladder emptying and thickening of the bladder wall which is pinching the ureters.  Subsequently emptied the patient's bladder and remove the scope.  At this point I did pass a Foley catheter to avoid any prostatic bleeding.  The catheter should be removed in the PACU assuming that the patient's urine clears.  Disposition: No stents were left, the patient should be discharged home without a Foley catheter.  He will be instructed to continue with clean intermittent catheterization twice daily.  Ardis Hughs, M.D.

## 2020-02-17 NOTE — Anesthesia Procedure Notes (Signed)
Procedure Name: LMA Insertion Date/Time: 02/17/2020 7:31 AM Performed by: Pilar Grammes, CRNA Pre-anesthesia Checklist: Patient identified, Emergency Drugs available, Suction available and Patient being monitored Patient Re-evaluated:Patient Re-evaluated prior to induction Oxygen Delivery Method: Circle system utilized Preoxygenation: Pre-oxygenation with 100% oxygen Induction Type: IV induction Ventilation: Mask ventilation without difficulty LMA: LMA inserted LMA Size: 4.5 Number of attempts: 1 Placement Confirmation: positive ETCO2 Tube secured with: Tape Dental Injury: Teeth and Oropharynx as per pre-operative assessment

## 2020-02-18 ENCOUNTER — Encounter (HOSPITAL_COMMUNITY): Payer: Self-pay | Admitting: Urology

## 2020-03-02 DIAGNOSIS — N401 Enlarged prostate with lower urinary tract symptoms: Secondary | ICD-10-CM | POA: Diagnosis not present

## 2020-03-02 DIAGNOSIS — R3914 Feeling of incomplete bladder emptying: Secondary | ICD-10-CM | POA: Diagnosis not present

## 2020-03-02 DIAGNOSIS — N130 Hydronephrosis with ureteropelvic junction obstruction: Secondary | ICD-10-CM | POA: Diagnosis not present

## 2020-03-21 DIAGNOSIS — Z23 Encounter for immunization: Secondary | ICD-10-CM | POA: Diagnosis not present

## 2020-03-28 DIAGNOSIS — Z7409 Other reduced mobility: Secondary | ICD-10-CM | POA: Diagnosis not present

## 2020-03-28 DIAGNOSIS — R208 Other disturbances of skin sensation: Secondary | ICD-10-CM | POA: Diagnosis not present

## 2020-03-28 DIAGNOSIS — I69354 Hemiplegia and hemiparesis following cerebral infarction affecting left non-dominant side: Secondary | ICD-10-CM | POA: Diagnosis not present

## 2020-03-28 DIAGNOSIS — Z7901 Long term (current) use of anticoagulants: Secondary | ICD-10-CM | POA: Diagnosis not present

## 2020-03-28 DIAGNOSIS — Z789 Other specified health status: Secondary | ICD-10-CM | POA: Diagnosis not present

## 2020-03-28 DIAGNOSIS — G629 Polyneuropathy, unspecified: Secondary | ICD-10-CM | POA: Diagnosis not present

## 2020-03-28 DIAGNOSIS — G8110 Spastic hemiplegia affecting unspecified side: Secondary | ICD-10-CM | POA: Diagnosis not present

## 2020-03-28 DIAGNOSIS — M792 Neuralgia and neuritis, unspecified: Secondary | ICD-10-CM | POA: Diagnosis not present

## 2020-04-10 DIAGNOSIS — R339 Retention of urine, unspecified: Secondary | ICD-10-CM | POA: Diagnosis not present

## 2020-04-10 DIAGNOSIS — N319 Neuromuscular dysfunction of bladder, unspecified: Secondary | ICD-10-CM | POA: Diagnosis not present

## 2020-05-29 DIAGNOSIS — R8271 Bacteriuria: Secondary | ICD-10-CM | POA: Diagnosis not present

## 2020-06-26 DIAGNOSIS — R3914 Feeling of incomplete bladder emptying: Secondary | ICD-10-CM | POA: Diagnosis not present

## 2020-06-29 ENCOUNTER — Emergency Department (HOSPITAL_BASED_OUTPATIENT_CLINIC_OR_DEPARTMENT_OTHER): Payer: Medicare HMO

## 2020-06-29 ENCOUNTER — Inpatient Hospital Stay (HOSPITAL_BASED_OUTPATIENT_CLINIC_OR_DEPARTMENT_OTHER)
Admission: EM | Admit: 2020-06-29 | Discharge: 2020-07-05 | DRG: 683 | Disposition: A | Discharge status: Skilled Nursing Facility | Payer: Medicare HMO | Attending: Internal Medicine | Admitting: Internal Medicine

## 2020-06-29 ENCOUNTER — Encounter (HOSPITAL_BASED_OUTPATIENT_CLINIC_OR_DEPARTMENT_OTHER): Payer: Self-pay | Admitting: Emergency Medicine

## 2020-06-29 DIAGNOSIS — Z9861 Coronary angioplasty status: Secondary | ICD-10-CM

## 2020-06-29 DIAGNOSIS — N1831 Chronic kidney disease, stage 3a: Secondary | ICD-10-CM | POA: Diagnosis present

## 2020-06-29 DIAGNOSIS — N136 Pyonephrosis: Secondary | ICD-10-CM | POA: Diagnosis present

## 2020-06-29 DIAGNOSIS — Z79899 Other long term (current) drug therapy: Secondary | ICD-10-CM

## 2020-06-29 DIAGNOSIS — F419 Anxiety disorder, unspecified: Secondary | ICD-10-CM | POA: Diagnosis present

## 2020-06-29 DIAGNOSIS — G40909 Epilepsy, unspecified, not intractable, without status epilepticus: Secondary | ICD-10-CM | POA: Diagnosis present

## 2020-06-29 DIAGNOSIS — E860 Dehydration: Secondary | ICD-10-CM | POA: Diagnosis present

## 2020-06-29 DIAGNOSIS — I252 Old myocardial infarction: Secondary | ICD-10-CM

## 2020-06-29 DIAGNOSIS — Z888 Allergy status to other drugs, medicaments and biological substances status: Secondary | ICD-10-CM

## 2020-06-29 DIAGNOSIS — N183 Chronic kidney disease, stage 3 unspecified: Secondary | ICD-10-CM

## 2020-06-29 DIAGNOSIS — F32A Depression, unspecified: Secondary | ICD-10-CM | POA: Diagnosis present

## 2020-06-29 DIAGNOSIS — E785 Hyperlipidemia, unspecified: Secondary | ICD-10-CM | POA: Diagnosis present

## 2020-06-29 DIAGNOSIS — N179 Acute kidney failure, unspecified: Secondary | ICD-10-CM | POA: Diagnosis not present

## 2020-06-29 DIAGNOSIS — N390 Urinary tract infection, site not specified: Secondary | ICD-10-CM

## 2020-06-29 DIAGNOSIS — R0902 Hypoxemia: Secondary | ICD-10-CM | POA: Diagnosis not present

## 2020-06-29 DIAGNOSIS — D509 Iron deficiency anemia, unspecified: Secondary | ICD-10-CM | POA: Diagnosis present

## 2020-06-29 DIAGNOSIS — Z20822 Contact with and (suspected) exposure to covid-19: Secondary | ICD-10-CM | POA: Diagnosis present

## 2020-06-29 DIAGNOSIS — R410 Disorientation, unspecified: Secondary | ICD-10-CM | POA: Diagnosis not present

## 2020-06-29 DIAGNOSIS — H9190 Unspecified hearing loss, unspecified ear: Secondary | ICD-10-CM | POA: Diagnosis present

## 2020-06-29 DIAGNOSIS — R5381 Other malaise: Secondary | ICD-10-CM | POA: Diagnosis not present

## 2020-06-29 DIAGNOSIS — I69354 Hemiplegia and hemiparesis following cerebral infarction affecting left non-dominant side: Secondary | ICD-10-CM

## 2020-06-29 DIAGNOSIS — R509 Fever, unspecified: Secondary | ICD-10-CM

## 2020-06-29 DIAGNOSIS — Z9081 Acquired absence of spleen: Secondary | ICD-10-CM

## 2020-06-29 DIAGNOSIS — Z7982 Long term (current) use of aspirin: Secondary | ICD-10-CM

## 2020-06-29 DIAGNOSIS — D693 Immune thrombocytopenic purpura: Secondary | ICD-10-CM | POA: Diagnosis present

## 2020-06-29 DIAGNOSIS — G4730 Sleep apnea, unspecified: Secondary | ICD-10-CM | POA: Diagnosis present

## 2020-06-29 DIAGNOSIS — R296 Repeated falls: Secondary | ICD-10-CM | POA: Diagnosis present

## 2020-06-29 DIAGNOSIS — Z87891 Personal history of nicotine dependence: Secondary | ICD-10-CM

## 2020-06-29 DIAGNOSIS — R531 Weakness: Secondary | ICD-10-CM | POA: Diagnosis not present

## 2020-06-29 DIAGNOSIS — Z7901 Long term (current) use of anticoagulants: Secondary | ICD-10-CM

## 2020-06-29 DIAGNOSIS — N319 Neuromuscular dysfunction of bladder, unspecified: Secondary | ICD-10-CM

## 2020-06-29 DIAGNOSIS — I2510 Atherosclerotic heart disease of native coronary artery without angina pectoris: Secondary | ICD-10-CM | POA: Diagnosis present

## 2020-06-29 DIAGNOSIS — Z86718 Personal history of other venous thrombosis and embolism: Secondary | ICD-10-CM

## 2020-06-29 DIAGNOSIS — D6862 Lupus anticoagulant syndrome: Secondary | ICD-10-CM | POA: Diagnosis present

## 2020-06-29 DIAGNOSIS — I129 Hypertensive chronic kidney disease with stage 1 through stage 4 chronic kidney disease, or unspecified chronic kidney disease: Secondary | ICD-10-CM | POA: Diagnosis present

## 2020-06-29 LAB — URINALYSIS, ROUTINE W REFLEX MICROSCOPIC
Bilirubin Urine: NEGATIVE
Glucose, UA: NEGATIVE mg/dL
Ketones, ur: NEGATIVE mg/dL
Nitrite: NEGATIVE
Protein, ur: NEGATIVE mg/dL
Specific Gravity, Urine: 1.01 (ref 1.005–1.030)
pH: 6 (ref 5.0–8.0)

## 2020-06-29 LAB — URINALYSIS, MICROSCOPIC (REFLEX)

## 2020-06-29 MED ORDER — SODIUM CHLORIDE 0.9 % IV BOLUS
1000.0000 mL | Freq: Once | INTRAVENOUS | Status: AC
  Administered 2020-06-30: 1000 mL via INTRAVENOUS

## 2020-06-29 NOTE — ED Triage Notes (Signed)
Pt brought in by [facility] from home with c/o low grade fever and difficulty urinating   Wife called his primary doctor and was told to bring him to the hospital as he may have a UTI  Pt is alert and oriented x 3  Skin warm and dry

## 2020-06-29 NOTE — ED Provider Notes (Signed)
[facility] Provider Note   CSN: [id number] Arrival date & time: 06/29/20  2132     History Chief Complaint  Patient presents with   Fever    Mark [name] is a 75 y.o. male.  Patient is a 75 year old male with history of ITP, prior CVA with left-sided hemiparesis, prior DVT, seizures, and neurogenic bladder for which wife performs straight caths twice daily.  Patient presenting today for evaluation of fever and weakness.  Patient reports fever starting yesterday up to 102.5 along with weakness and difficulty getting around.  According to the wife, he has fallen on several occasions over the past several days.  EMS is come to the house to help him up.  Wife called the urologist today and Keflex was called in for presumed UTI.  Patient has had 2 doses of this.  Wife does report cloudy urine when performing the caths.  The history is provided by the patient.  Fever Max temp prior to arrival:  102.5 Temp source:  Oral Severity:  Moderate Onset quality:  Sudden Timing:  Constant Progression:  Worsening Chronicity:  New Relieved by:  Nothing Worsened by:  Nothing Ineffective treatments:  None tried Associated symptoms: no chest pain, no chills, no cough and no diarrhea        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    Lupus anticoagulant disorder ([place])    Lupus anticoagulant syndrome ([place])    Multiple closed anterior-posterior compression fractures of pelvis (HCC)    Seizures ([place])    last seizure 10 years ago approx    Sleep apnea    cpap broken x 1 year per wife    Stroke ([place])  02/27/2011   Left side weakness    Patient Active Problem List   Diagnosis Date Noted   Pulmonary nodule 10/24/2016   Acute hypoxemic respiratory failure ([place]) 10/21/2016   Sepsis ([place]) 10/21/2016   DVT (deep venous thrombosis) ([place]) 10/12/2016   Supratherapeutic INR 10/12/2016   Acute deep vein thrombosis (DVT) of popliteal vein of right lower extremity ([place]) 10/12/2016   AKI (acute kidney injury) ([place]) 10/12/2016   Fever 10/12/2016   Leukocytosis 10/12/2016   Bacteriuria 10/12/2016   Deep vein thrombosis (DVT) ([place]) 03/02/2015   Lupus anticoagulant disorder ([place]) 03/02/2015   Ischemic stroke ([place]) 10/04/2014   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   Deep vein thrombosis ([place]) 10/04/2014   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   LA (lupus anticoagulant) disorder ([place]) 10/04/2014   Arteriosclerosis of coronary artery 09/10/2012   Apnea, sleep 09/10/2012   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   Dermatophytic onychia 01/29/2012   Spastic hemiplegia ([place]) 12/03/2011   Hemiparesis, left ([place]) 10/31/2011   Dysphonia 08/13/2011   Idiopathic thrombocytopenic purpura ([place]) 08/12/2011   ITP (idiopathic thrombocytopenic purpura) 08/12/2011    Past Surgical History:  Procedure Laterality Date   blood clot Right    surgical removal   CHOLECYSTECTOMY     CORONARY ANGIOPLASTY  1992   CYSTOGRAM N/A 02/17/2020   Procedure: CYSTOGRAM;  Surgeon: Louis Meckel  W, MD;  Location: [facility];  Service: Urology;  Laterality: N/A;   CYSTOSCOPY WITH RETROGRADE PYELOGRAM, URETEROSCOPY AND STENT PLACEMENT Bilateral 02/17/2020   Procedure: Donnajean Lopes  URETEROSCOPY AND STENT PLACEMENT;  Surgeon: Ardis Hughs, MD;  Location: [facility];  Service: Urology;  Laterality: Bilateral;   SPLENECTOMY, TOTAL     vertebralplasty         History reviewed. No pertinent family history.  Social History   Tobacco Use   Smoking status:  Former Smoker    Packs/day: 1.00    Years: 15.00    Pack years: 15.00    Types: Cigarettes    Start date: 06/12/1968    Quit date: 01/27/1985    Years since quitting: 35.4   Smokeless tobacco: Never Used   Tobacco comment: quit smoking 28 yeras ago  Vaping Use   Vaping Use: Never used  Substance Use Topics   Alcohol use: No    Alcohol/week: 0.0 standard drinks    Home Medications Prior to Admission medications   Medication Sig Start Date End Date Taking? Authorizing Provider  acetaminophen (TYLENOL) 325 MG tablet Take 650 mg by mouth every morning.     [provider]  amLODipine (NORVASC) 5 MG tablet Take 5 mg by mouth daily.  02/26/17   [provider]  aspirin 81 MG chewable tablet Chew 1 tablet (81 mg total) by mouth at bedtime. Patient not taking: Reported on 02/10/2020 10/23/16   Charlynne Cousins, MD  aspirin EC 81 MG tablet Take 81 mg by mouth daily. Swallow whole.    [provider]  buPROPion (WELLBUTRIN XL) 150 MG 24 hr tablet Take 150 mg by mouth daily.  12/19/14   [provider]  Cholecalciferol (VITAMIN D3) 125 MCG (5000 UT) TABS Take 5,000 Units by mouth every other day.     [provider]  CRESTOR 20 MG tablet Take 20 mg by mouth at bedtime.  11/16/14   [provider]  dexamethasone (DECADRON) 4 MG tablet TAKE 10 TABLETS BY MOUTH ONCE DAILY AS NEEDED FOR 3 DAYS FOR  ITP Patient taking differently: Take 40 mg by mouth See admin instructions. Take 40 mg by mouth once daily as needed for 3 days for ITP 06/02/19   Volanda Napoleon, MD  fluticasone [facility]) 50 MCG/ACT nasal spray Place 2 sprays into both nostrils daily as needed for allergies or rhinitis.  06/23/14   [provider]  HYDROcodone-acetaminophen (NORCO) 10-325 MG tablet Take 1 tablet by mouth at bedtime.  07/08/19   [provider]  levETIRAcetam (KEPPRA) 500 MG tablet Take 1,500 mg by mouth 2 (two) times daily.  12/24/11   [provider]  Melatonin 10 MG TABS Take 10 mg by mouth at bedtime.     [provider]  Multiple Vitamins-Minerals (MULTIVITAMIN WITH MINERALS) tablet Take 1 tablet by mouth 2 (two) times daily.  Patient not taking: Reported on 02/10/2020    [provider]  NYSTATIN powder  03/13/17   [provider]  omeprazole (PRILOSEC) 20 MG capsule Take 20 mg by mouth daily. Patient not taking: Reported on 02/10/2020    [provider]  omeprazole (PRILOSEC) 40 MG capsule Take 40 mg by mouth every morning. 08/01/19   [provider]  phenazopyridine (PYRIDIUM) 200 MG tablet Take 1 tablet (200 mg total) by mouth 3 (three) times daily as needed for pain. 02/17/20   Ardis Hughs, MD  polyethylene glycol [facility] Floria Raveling) packet  Take 17 g by mouth daily as needed. Patient taking differently: Take 17 g by mouth daily as needed for moderate constipation.  10/25/16   Domenic Polite, MD  sertraline (ZOLOFT) 100 MG tablet Take 200 mg by mouth in the morning.     [provider]  Tamsulosin HCl (FLOMAX) 0.4 MG CAPS Take 0.8 mg by mouth at bedtime.  Patient not taking: Reported on 02/10/2020    [provider]  XARELTO 20 MG TABS tablet Take 1 tablet by mouth once daily with breakfast Patient taking differently: Take 20 mg by mouth daily with breakfast.  10/21/19   Volanda Napoleon, MD    Allergies    Patient has no known allergies.  Review of Systems   Review of Systems  Constitutional: Positive for fever. Negative for chills.  Respiratory: Negative for cough.   Cardiovascular: Negative for chest pain.  Gastrointestinal: Negative for diarrhea.  All other systems reviewed and are negative.   Physical Exam Updated Vital Signs BP 116/65    Pulse 75    Temp 99.9 F (37.7 C) (Oral)    Resp 13    Ht 5\' 10"  (1.778 m)    Wt 85.7 kg    SpO2 97%    BMI 27.12 kg/m   Physical Exam Vitals and nursing note reviewed.  Constitutional:       General: He is not in acute distress.    Appearance: He is well-developed. He is not diaphoretic.  HENT:     Head: Normocephalic and atraumatic.  Cardiovascular:     Rate and Rhythm: Normal rate and regular rhythm.     Heart sounds: No murmur heard. No friction rub.  Pulmonary:     Effort: Pulmonary effort is normal. No respiratory distress.     Breath sounds: Normal breath sounds. No wheezing or rales.  Abdominal:     General: Bowel sounds are normal. There is no distension.     Palpations: Abdomen is soft.     Tenderness: There is no abdominal tenderness.  Musculoskeletal:        General: Normal range of motion.     Cervical back: Normal range of motion and neck supple.  Skin:    General: Skin is warm and dry.  Neurological:     Mental Status: He is alert and oriented to person, place, and time.     Coordination: Coordination normal.     Comments: Patient with baseline left-sided hemiparesis noted.     ED Results / Procedures / Treatments   Labs (all labs ordered are listed, but only abnormal results are displayed) Labs Reviewed  URINALYSIS, ROUTINE W REFLEX MICROSCOPIC - Abnormal; Notable for the following components:      Result Value   APPearance HAZY (*)    Hgb urine dipstick SMALL (*)    Leukocytes,Ua MODERATE (*)    All other components within normal limits  URINALYSIS, MICROSCOPIC (REFLEX) - Abnormal; Notable for the following components:   Bacteria, UA FEW (*)    All other components within normal limits  URINE CULTURE  CULTURE, BLOOD (ROUTINE X 2)  CULTURE, BLOOD (ROUTINE X 2)  RESP PANEL BY RT-PCR (FLU A&B, COVID) ARPGX2  COMPREHENSIVE METABOLIC PANEL  CBC WITH DIFFERENTIAL/PLATELET  LACTIC ACID, PLASMA  LACTIC ACID, PLASMA    EKG None  Radiology No results found.  Procedures Procedures   Medications Ordered in ED Medications  sodium chloride 0.9 % bolus 1,000 mL (has no administration in time range)  ED Course  I have reviewed the triage  vital signs and the nursing notes.  Pertinent labs & imaging results that were available during my care of the patient were reviewed by me and considered in my medical decision making (see chart for details).    MDM Rules/Calculators/A&P  Patient with history of prior stroke with left-sided hemiparesis and neurogenic bladder.  Patient performs self caths at home and became febrile this afternoon.  He has been weak for the past several days and has sustained multiple falls at home.  Patient's vital signs stable.  Initial temperature was 100.2.  Blood cultures obtained as well as a chest x-ray and urinalysis.  There is evidence for a urinary tract infection which will be treated with Rocephin.  Laboratory studies also reveal a leukocytosis and acute renal failure reflective of dehydration.  Patient given normal saline here in the ER.  He will be admitted to the hospitalist service for further treatment.  CRITICAL CARE Performed by: Veryl Speak Total critical care time: 35 minutes Critical care time was exclusive of separately billable procedures and treating other patients. Critical care was necessary to treat or prevent imminent or life-threatening deterioration. Critical care was time spent personally by me on the following activities: development of treatment plan with patient and/or surrogate as well as nursing, discussions with consultants, evaluation of patient's response to treatment, examination of patient, obtaining history from patient or surrogate, ordering and performing treatments and interventions, ordering and review of laboratory studies, ordering and review of radiographic studies, pulse oximetry and re-evaluation of patient's condition.   Final Clinical Impression(s) / ED Diagnoses Final diagnoses:  None    Rx / DC Orders ED Discharge Orders    None       Veryl Speak, MD 06/30/20 [phone]

## 2020-06-30 ENCOUNTER — Observation Stay (HOSPITAL_COMMUNITY): Payer: Medicare HMO

## 2020-06-30 ENCOUNTER — Other Ambulatory Visit: Payer: Self-pay

## 2020-06-30 DIAGNOSIS — Z7982 Long term (current) use of aspirin: Secondary | ICD-10-CM | POA: Diagnosis not present

## 2020-06-30 DIAGNOSIS — D693 Immune thrombocytopenic purpura: Secondary | ICD-10-CM | POA: Diagnosis not present

## 2020-06-30 DIAGNOSIS — D6862 Lupus anticoagulant syndrome: Secondary | ICD-10-CM | POA: Diagnosis not present

## 2020-06-30 DIAGNOSIS — R296 Repeated falls: Secondary | ICD-10-CM | POA: Diagnosis not present

## 2020-06-30 DIAGNOSIS — N1831 Chronic kidney disease, stage 3a: Secondary | ICD-10-CM | POA: Diagnosis not present

## 2020-06-30 DIAGNOSIS — Z87891 Personal history of nicotine dependence: Secondary | ICD-10-CM | POA: Diagnosis not present

## 2020-06-30 DIAGNOSIS — H9190 Unspecified hearing loss, unspecified ear: Secondary | ICD-10-CM | POA: Diagnosis not present

## 2020-06-30 DIAGNOSIS — N319 Neuromuscular dysfunction of bladder, unspecified: Secondary | ICD-10-CM | POA: Diagnosis not present

## 2020-06-30 DIAGNOSIS — N136 Pyonephrosis: Secondary | ICD-10-CM | POA: Diagnosis not present

## 2020-06-30 DIAGNOSIS — N1330 Unspecified hydronephrosis: Secondary | ICD-10-CM | POA: Diagnosis not present

## 2020-06-30 DIAGNOSIS — R531 Weakness: Secondary | ICD-10-CM | POA: Diagnosis not present

## 2020-06-30 DIAGNOSIS — Z888 Allergy status to other drugs, medicaments and biological substances status: Secondary | ICD-10-CM | POA: Diagnosis not present

## 2020-06-30 DIAGNOSIS — D509 Iron deficiency anemia, unspecified: Secondary | ICD-10-CM | POA: Diagnosis not present

## 2020-06-30 DIAGNOSIS — N179 Acute kidney failure, unspecified: Principal | ICD-10-CM

## 2020-06-30 DIAGNOSIS — I2510 Atherosclerotic heart disease of native coronary artery without angina pectoris: Secondary | ICD-10-CM | POA: Diagnosis not present

## 2020-06-30 DIAGNOSIS — Z9081 Acquired absence of spleen: Secondary | ICD-10-CM | POA: Diagnosis not present

## 2020-06-30 DIAGNOSIS — E785 Hyperlipidemia, unspecified: Secondary | ICD-10-CM | POA: Diagnosis not present

## 2020-06-30 DIAGNOSIS — I129 Hypertensive chronic kidney disease with stage 1 through stage 4 chronic kidney disease, or unspecified chronic kidney disease: Secondary | ICD-10-CM | POA: Diagnosis not present

## 2020-06-30 DIAGNOSIS — Z79899 Other long term (current) drug therapy: Secondary | ICD-10-CM | POA: Diagnosis not present

## 2020-06-30 DIAGNOSIS — G40909 Epilepsy, unspecified, not intractable, without status epilepticus: Secondary | ICD-10-CM | POA: Diagnosis not present

## 2020-06-30 DIAGNOSIS — F32A Depression, unspecified: Secondary | ICD-10-CM | POA: Diagnosis not present

## 2020-06-30 DIAGNOSIS — G4730 Sleep apnea, unspecified: Secondary | ICD-10-CM | POA: Diagnosis not present

## 2020-06-30 DIAGNOSIS — I69354 Hemiplegia and hemiparesis following cerebral infarction affecting left non-dominant side: Secondary | ICD-10-CM | POA: Diagnosis not present

## 2020-06-30 DIAGNOSIS — R509 Fever, unspecified: Secondary | ICD-10-CM | POA: Diagnosis not present

## 2020-06-30 DIAGNOSIS — Z9861 Coronary angioplasty status: Secondary | ICD-10-CM | POA: Diagnosis not present

## 2020-06-30 DIAGNOSIS — Z20822 Contact with and (suspected) exposure to covid-19: Secondary | ICD-10-CM | POA: Diagnosis not present

## 2020-06-30 DIAGNOSIS — Z7901 Long term (current) use of anticoagulants: Secondary | ICD-10-CM | POA: Diagnosis not present

## 2020-06-30 LAB — CBC WITH DIFFERENTIAL/PLATELET
Abs Immature Granulocytes: 0.17 10*3/uL — ABNORMAL HIGH (ref 0.00–0.07)
Basophils Absolute: 0.1 10*3/uL (ref 0.0–0.1)
Basophils Relative: 1 %
Eosinophils Absolute: 0.5 10*3/uL (ref 0.0–0.5)
Eosinophils Relative: 4 %
HCT: 30.1 % — ABNORMAL LOW (ref 39.0–52.0)
Hemoglobin: 10.1 g/dL — ABNORMAL LOW (ref 13.0–17.0)
Immature Granulocytes: 1 %
Lymphocytes Relative: 16 %
Lymphs Abs: 2.3 10*3/uL (ref 0.7–4.0)
MCH: 29.7 pg (ref 26.0–34.0)
MCHC: 33.6 g/dL (ref 30.0–36.0)
MCV: 88.5 fL (ref 80.0–100.0)
Monocytes Absolute: 2.2 10*3/uL — ABNORMAL HIGH (ref 0.1–1.0)
Monocytes Relative: 16 %
Neutro Abs: 8.6 10*3/uL — ABNORMAL HIGH (ref 1.7–7.7)
Neutrophils Relative %: 62 %
Platelets: 274 10*3/uL (ref 150–400)
RBC: 3.4 MIL/uL — ABNORMAL LOW (ref 4.22–5.81)
RDW: 14.5 % (ref 11.5–15.5)
WBC: 13.8 10*3/uL — ABNORMAL HIGH (ref 4.0–10.5)
nRBC: 0 % (ref 0.0–0.2)

## 2020-06-30 LAB — COMPREHENSIVE METABOLIC PANEL
ALT: 43 U/L (ref 0–44)
AST: 35 U/L (ref 15–41)
Albumin: 3.6 g/dL (ref 3.5–5.0)
Alkaline Phosphatase: 57 U/L (ref 38–126)
Anion gap: 11 (ref 5–15)
BUN: 57 mg/dL — ABNORMAL HIGH (ref 8–23)
CO2: 21 mmol/L — ABNORMAL LOW (ref 22–32)
Calcium: 8.8 mg/dL — ABNORMAL LOW (ref 8.9–10.3)
Chloride: 103 mmol/L (ref 98–111)
Creatinine, Ser: 1.72 mg/dL — ABNORMAL HIGH (ref 0.61–1.24)
GFR, Estimated: 41 mL/min — ABNORMAL LOW (ref >60)
Glucose, Bld: 105 mg/dL — ABNORMAL HIGH (ref 70–99)
Potassium: 3.6 mmol/L (ref 3.5–5.1)
Sodium: 135 mmol/L (ref 135–145)
Total Bilirubin: 0.6 mg/dL (ref 0.3–1.2)
Total Protein: 7 g/dL (ref 6.5–8.1)

## 2020-06-30 LAB — RESP PANEL BY RT-PCR (FLU A&B, COVID) ARPGX2
Influenza A by PCR: NEGATIVE
Influenza B by PCR: NEGATIVE
SARS Coronavirus 2 by RT PCR: NEGATIVE

## 2020-06-30 LAB — IRON AND TIBC
Iron: 15 ug/dL — ABNORMAL LOW (ref 45–182)
Saturation Ratios: 8 % — ABNORMAL LOW (ref 17.9–39.5)
TIBC: 196 ug/dL — ABNORMAL LOW (ref 250–450)
UIBC: 181 ug/dL

## 2020-06-30 LAB — LACTIC ACID, PLASMA: Lactic Acid, Venous: 0.8 mmol/L (ref 0.5–1.9)

## 2020-06-30 MED ORDER — ROSUVASTATIN CALCIUM 20 MG PO TABS
20.0000 mg | ORAL_TABLET | Freq: Every day | ORAL | Status: DC
  Administered 2020-06-30 – 2020-07-04 (×5): 20 mg via ORAL
  Filled 2020-06-30 (×5): qty 1

## 2020-06-30 MED ORDER — ONDANSETRON HCL 4 MG PO TABS: 4.0000 mg | ORAL_TABLET | Freq: Four times a day (QID) | ORAL | Status: DC | PRN

## 2020-06-30 MED ORDER — ACETAMINOPHEN 325 MG PO TABS
650.0000 mg | ORAL_TABLET | Freq: Four times a day (QID) | ORAL | Status: DC | PRN
  Administered 2020-07-02 – 2020-07-04 (×4): 650 mg via ORAL
  Filled 2020-06-30 (×4): qty 2

## 2020-06-30 MED ORDER — SODIUM CHLORIDE 0.9 % IV SOLN
1.0000 g | INTRAVENOUS | Status: DC
  Administered 2020-06-30 – 2020-07-01 (×2): 1 g via INTRAVENOUS
  Filled 2020-06-30 (×2): qty 1

## 2020-06-30 MED ORDER — MELATONIN 5 MG PO TABS
10.0000 mg | ORAL_TABLET | Freq: Every day | ORAL | Status: DC
  Administered 2020-06-30 – 2020-07-04 (×5): 10 mg via ORAL
  Filled 2020-06-30 (×5): qty 2

## 2020-06-30 MED ORDER — SODIUM CHLORIDE 0.9 % IV SOLN: INTRAVENOUS | Status: DC

## 2020-06-30 MED ORDER — POLYETHYLENE GLYCOL 3350 17 G PO PACK: 17.0000 g | PACK | Freq: Every day | ORAL | Status: DC | PRN

## 2020-06-30 MED ORDER — SERTRALINE HCL 100 MG PO TABS
200.0000 mg | ORAL_TABLET | Freq: Every morning | ORAL | Status: DC
  Administered 2020-06-30 – 2020-07-05 (×6): 200 mg via ORAL
  Filled 2020-06-30 (×6): qty 2

## 2020-06-30 MED ORDER — ASPIRIN EC 81 MG PO TBEC
81.0000 mg | DELAYED_RELEASE_TABLET | Freq: Every day | ORAL | Status: DC
  Administered 2020-06-30 – 2020-07-05 (×6): 81 mg via ORAL
  Filled 2020-06-30 (×6): qty 1

## 2020-06-30 MED ORDER — VITAMIN D 25 MCG (1000 UNIT) PO TABS
5000.0000 [IU] | ORAL_TABLET | ORAL | Status: DC
  Administered 2020-06-30 – 2020-07-04 (×3): 5000 [IU] via ORAL
  Filled 2020-06-30 (×3): qty 5

## 2020-06-30 MED ORDER — AMLODIPINE BESYLATE 5 MG PO TABS
5.0000 mg | ORAL_TABLET | Freq: Every day | ORAL | Status: DC
  Administered 2020-07-01 – 2020-07-05 (×5): 5 mg via ORAL
  Filled 2020-06-30 (×6): qty 1

## 2020-06-30 MED ORDER — ACETAMINOPHEN 650 MG RE SUPP: 650.0000 mg | Freq: Four times a day (QID) | RECTAL | Status: DC | PRN

## 2020-06-30 MED ORDER — FLUTICASONE PROPIONATE 50 MCG/ACT NA SUSP: 2.0000 | Freq: Every day | NASAL | Status: DC | PRN

## 2020-06-30 MED ORDER — SODIUM CHLORIDE 0.9 % IV SOLN
1.0000 g | Freq: Once | INTRAVENOUS | Status: AC
  Administered 2020-06-30: 1 g via INTRAVENOUS
  Filled 2020-06-30: qty 10

## 2020-06-30 MED ORDER — BUPROPION HCL ER (XL) 150 MG PO TB24
150.0000 mg | ORAL_TABLET | Freq: Every day | ORAL | Status: DC
  Administered 2020-06-30 – 2020-07-05 (×6): 150 mg via ORAL
  Filled 2020-06-30 (×6): qty 1

## 2020-06-30 MED ORDER — RIVAROXABAN 10 MG PO TABS
20.0000 mg | ORAL_TABLET | Freq: Every day | ORAL | Status: DC
  Administered 2020-06-30 – 2020-07-04 (×5): 20 mg via ORAL
  Filled 2020-06-30 (×5): qty 2

## 2020-06-30 MED ORDER — ONDANSETRON HCL 4 MG/2ML IJ SOLN: 4.0000 mg | Freq: Four times a day (QID) | INTRAMUSCULAR | Status: DC | PRN

## 2020-06-30 MED ORDER — LEVETIRACETAM 500 MG PO TABS
1500.0000 mg | ORAL_TABLET | Freq: Two times a day (BID) | ORAL | Status: DC
  Administered 2020-06-30 – 2020-07-05 (×11): 1500 mg via ORAL
  Filled 2020-06-30 (×12): qty 3

## 2020-06-30 NOTE — NC FL2 (Signed)
[place] LEVEL OF CARE SCREENING TOOL     IDENTIFICATION  Patient Name: Mark [name] Birthdate: 1946/01/30 Sex: male Admission Date (Current Location): 06/29/2020  [facility] and [place] Number:  Herbalist and Address:  The . [facility], [place] [address] [place], [place] [address]      Provider Number: [id number]  Attending Physician Name and Address:  Jonnie Finner, DO  Relative Name and Phone Number:  wife, [name] @ [phone]    Current Level of Care: Hospital Recommended Level of Care: [place] Prior Approval Number:    Date Approved/Denied:   PASRR Number: [id number]  Discharge Plan: SNF    Current Diagnoses: Patient Active Problem List   Diagnosis Date Noted  . Pulmonary nodule 10/24/2016  . Acute hypoxemic respiratory failure ([place]) 10/21/2016  . Sepsis ([place]) 10/21/2016  . DVT (deep venous thrombosis) ([place]) 10/12/2016  . Supratherapeutic INR 10/12/2016  . Acute deep vein thrombosis (DVT) of popliteal vein of right lower extremity ([place]) 10/12/2016  . AKI (acute kidney injury) ([place]) 10/12/2016  . Fever 10/12/2016  . Leukocytosis 10/12/2016  . Bacteriuria 10/12/2016  . Deep vein thrombosis (DVT) ([place]) 03/02/2015  . Lupus anticoagulant disorder ([place]) 03/02/2015  . Ischemic stroke ([place]) 10/04/2014  . 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 Deep vein thrombosis ([place]) 10/04/2014  . 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 LA (lupus anticoagulant) disorder ([place]) 10/04/2014  . Arteriosclerosis of coronary artery 09/10/2012  . Apnea, sleep 09/10/2012  . 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 Dermatophytic onychia 01/29/2012  . Spastic hemiplegia ([place]) 12/03/2011  . Hemiparesis, left ([place]) 10/31/2011  . Dysphonia 08/13/2011  . 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 ITP (idiopathic thrombocytopenic purpura) 08/12/2011    Orientation RESPIRATION BLADDER Height & Weight      Self,Time,Situation,Place  Normal Continent Weight: 189 lb (85.7 kg) Height:  5\' 10"  (177.8 cm)  BEHAVIORAL SYMPTOMS/MOOD NEUROLOGICAL BOWEL NUTRITION STATUS      Continent    AMBULATORY STATUS COMMUNICATION OF NEEDS Skin   Extensive Assist Non-Verbally Normal                       Personal Care Assistance Level of Assistance  Bathing,Dressing Bathing Assistance: Limited assistance   Dressing Assistance: Limited assistance     Functional Limitations Info             SPECIAL CARE FACTORS FREQUENCY  PT (By licensed PT),OT (By licensed OT)     PT Frequency: 5x/wk OT Frequency: 5x/wk            Contractures Contractures Info: Not present    Additional Factors Info  Code Status,Allergies,Psychotropic Code Status Info: FULL Allergies Info: see MAR Psychotropic Info: see MAR         Current Medications (06/30/2020):  This is the current hospital active medication list Current Facility-Administered Medications  Medication Dose Route Frequency Provider Last Rate Last Admin  . 0.9 %  sodium chloride infusion   Intravenous Continuous Cherylann Ratel A, DO 75 mL/hr at 06/30/20 0952 New Bag at 06/30/20 0952  . acetaminophen (TYLENOL) tablet 650 mg  650 mg Oral Q6H PRN Marylyn Ishihara, Tyrone A, DO       Or  . acetaminophen (TYLENOL) suppository 650 mg  650 mg Rectal Q6H PRN Marylyn Ishihara, Tyrone A, DO      . [START ON 07/01/2020] amLODipine (NORVASC) tablet 5 mg  5 mg Oral Daily Kyle, Tyrone A, DO      .  aspirin EC tablet 81 mg  81 mg Oral Daily Kyle, Tyrone A, DO   81 mg at 06/30/20 7793  . buPROPion (WELLBUTRIN XL) 24 hr tablet 150 mg  150 mg Oral Daily Kyle, Tyrone A, DO   150 mg at 06/30/20 0937  . cefTRIAXone (ROCEPHIN) 1 g in sodium chloride 0.9 % 100 mL IVPB  1 g Intravenous Q24H Kyle, Tyrone A, DO      . cholecalciferol (VITAMIN D3) tablet 5,000 Units  5,000 Units Oral QODAY Kyle, Tyrone A, DO   5,000 Units at 06/30/20 0937  . fluticasone (FLONASE) 50 MCG/ACT nasal spray 2 spray  2  spray Each Nare Daily PRN Marylyn Ishihara, Tyrone A, DO      . levETIRAcetam (KEPPRA) tablet 1,500 mg  1,500 mg Oral BID Marylyn Ishihara, Tyrone A, DO   1,500 mg at 06/30/20 0937  . melatonin tablet 10 mg  10 mg Oral QHS Kyle, Tyrone A, DO      . ondansetron (ZOFRAN) tablet 4 mg  4 mg Oral Q6H PRN Marylyn Ishihara, Tyrone A, DO       Or  . ondansetron (ZOFRAN) injection 4 mg  4 mg Intravenous Q6H PRN Marylyn Ishihara, Tyrone A, DO      . polyethylene glycol (MIRALAX / GLYCOLAX) packet 17 g  17 g Oral Daily PRN Marylyn Ishihara, Tyrone A, DO      . rivaroxaban (XARELTO) tablet 20 mg  20 mg Oral Q supper Kyle, Tyrone A, DO      . rosuvastatin (CRESTOR) tablet 20 mg  20 mg Oral QHS Kyle, Tyrone A, DO      . sertraline (ZOLOFT) tablet 200 mg  200 mg Oral q AM Marylyn Ishihara, Tyrone A, DO   200 mg at 06/30/20 9030     Discharge Medications: Please see discharge summary for a list of discharge medications.  Relevant Imaging Results:  Relevant Lab Results:   Additional Information SS # [id number]  Lennart Pall, LCSW

## 2020-06-30 NOTE — Plan of Care (Signed)
  Problem: Education: Goal: Knowledge of General Education information will improve Description: Including pain rating scale, medication(s)/side effects and non-pharmacologic comfort measures Outcome: Progressing   Problem: Health Behavior/Discharge Planning: Goal: Ability to manage health-related needs will improve Outcome: Progressing   Problem: Clinical Measurements: Goal: Diagnostic test results will improve Outcome: Progressing   

## 2020-06-30 NOTE — TOC Initial Note (Signed)
Transition of Care [facility]) - Initial/Assessment Note    Patient Details  Name: Mark [name] MRN: [id number] Date of Birth: 1946-01-12  [facility] CM/SW Contact:    Lennart Pall, LCSW Phone Number: 06/30/2020, 4:01 PM  Clinical Narrative:                 Met with pt and spouse this afternoon to introduce self/ role and discuss dc planning needs.  Pt and wife very pleasant and area aware that PT has made recommendation for SNF rehab and are agreeable.  We reviewed facilities that are in [place].  Will send out FL2 and ask TOC to review offers with them tomorrow - chosen facility will start insurance auth.    Expected Discharge Plan: Skilled Nursing Facility Barriers to Discharge: Continued Medical Work up   Patient Goals and CMS Choice Patient states their goals for this hospitalization and ongoing recovery are:: short term rehab and then home   Choice offered to / list presented to : Patient  Expected Discharge Plan and Services Expected Discharge Plan: [place] In-house Referral: Clinical Social Work   Post Acute Care Choice: [place] Living arrangements for the past 2 months: [place]                 DME Arranged: N/A DME Agency: NA                  Prior Living Arrangements/Services Living arrangements for the past 2 months: Single Family Home Lives with:: Spouse Patient language and need for interpreter reviewed:: No Do you feel safe going back to the place where you live?: Yes      Need for Family Participation in Patient Care: Yes (Comment) Care giver support system in place?: Yes (comment)   Criminal Activity/Legal Involvement Pertinent to Current Situation/Hospitalization: No - Comment as needed  Activities of Daily Living Home Assistive Devices/Equipment: Blood pressure cuff,Eyeglasses,Grab bars around toilet,Grab bars in shower,Hearing aid,Shower chair with back,Walker (specify type),Bedside  commode/3-in-1 ADL Screening (condition at time of admission) Patient's cognitive ability adequate to safely complete daily activities?: Yes Is the patient deaf or have difficulty hearing?: Yes Does the patient have difficulty seeing, even when wearing glasses/contacts?: No Does the patient have difficulty concentrating, remembering, or making decisions?: No Patient able to express need for assistance with ADLs?: Yes Does the patient have difficulty dressing or bathing?: Yes Independently performs ADLs?: No Communication: Independent Dressing (OT): Needs assistance Is this a change from baseline?: Pre-admission baseline Grooming: Needs assistance Is this a change from baseline?: Pre-admission baseline Feeding: Needs assistance Is this a change from baseline?: Pre-admission baseline Bathing: Needs assistance Is this a change from baseline?: Pre-admission baseline Toileting: Needs assistance Is this a change from baseline?: Pre-admission baseline In/Out Bed: Needs assistance Is this a change from baseline?: Pre-admission baseline Walks in Home: Needs assistance Is this a change from baseline?: Pre-admission baseline Does the patient have difficulty walking or climbing stairs?: Yes Weakness of Legs: Left Weakness of Arms/Hands: Left  Permission Sought/Granted Permission sought to share information with : Family Supports Permission granted to share information with : Yes, Verbal Permission Granted  Share Information with NAME: [name]     Permission granted to share info w Relationship: spouse  Permission granted to share info w Contact Information: [phone]  Emotional Assessment Appearance:: Appears stated age Attitude/Demeanor/Rapport: Gracious,Engaged Affect (typically observed): Accepting,Pleasant Orientation: : Oriented to Self,Oriented to Place,Oriented to  Time,Oriented to Situation Alcohol /  Substance Use: Not Applicable Psych Involvement: No (comment)  Admission  diagnosis:  Acute febrile illness [R50.9] AKI (acute kidney injury) ([place]) [N17.9] Urinary tract infection without hematuria, site unspecified [N39.0] Patient Active Problem List   Diagnosis Date Noted  . Pulmonary nodule 10/24/2016  . Acute hypoxemic respiratory failure ([place]) 10/21/2016  . Sepsis ([place]) 10/21/2016  . DVT (deep venous thrombosis) ([place]) 10/12/2016  . Supratherapeutic INR 10/12/2016  . Acute deep vein thrombosis (DVT) of popliteal vein of right lower extremity ([place]) 10/12/2016  . AKI (acute kidney injury) ([place]) 10/12/2016  . Fever 10/12/2016  . Leukocytosis 10/12/2016  . Bacteriuria 10/12/2016  . Deep vein thrombosis (DVT) ([place]) 03/02/2015  . Lupus anticoagulant disorder ([place]) 03/02/2015  . Ischemic stroke ([place]) 10/04/2014  . 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 Deep vein thrombosis ([place]) 10/04/2014  . 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 LA (lupus anticoagulant) disorder ([place]) 10/04/2014  . Arteriosclerosis of coronary artery 09/10/2012  . Apnea, sleep 09/10/2012  . 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 Dermatophytic onychia 01/29/2012  . Spastic hemiplegia ([place]) 12/03/2011  . Hemiparesis, left ([place]) 10/31/2011  . Dysphonia 08/13/2011  . Idiopathic thrombocytopenic purpura ([place]) 08/12/2011  . ITP (idiopathic thrombocytopenic purpura) 08/12/2011   PCP:  Harlan Stains, MD Pharmacy:   [place], [place] - [address] [place] [address] [place] [address] Phone: [phone] Fax: [phone]     Social Determinants of Health (SDOH) Interventions    Readmission Risk Interventions No flowsheet data found.

## 2020-06-30 NOTE — ED Notes (Signed)
Patient assisted to bedside commode. Spouse at bedside for assistance. Advised to call out when ready to get up.

## 2020-06-30 NOTE — Evaluation (Signed)
Physical Therapy Evaluation Patient Details Name: Mark [name] MRN: [id number] DOB: 10-01-1945 Today's Date: 06/30/2020   History of Present Illness  75 y.o. male with medical history significant of epilepsy, CVA x 2 w/ left residuals, chronic ITP. Presenting with generalized weakness, falls, and fever x 4 days. Dx of AKI, UTI.  Clinical Impression  Pt admitted with above diagnosis. Pt ambulated 39' with RW with min/mod assist for balance and to manage RW. He has had many falls prior to admission, and he remains a high fall risk. He requires hands on assist when ambulating due to unsteady gait.  His wife cannot provide more than very minimal physical assist due to her own health issues. ST-SNF recommended, Pt/spouse/daughter are agreeable. Pt is very pleasant and puts forth good effort.  Pt currently with functional limitations due to the deficits listed below (see PT Problem List). Pt will benefit from skilled PT to increase their independence and safety with mobility to allow discharge to the venue listed below.       Follow Up Recommendations SNF;Supervision for mobility/OOB;Supervision/Assistance - 24 hour    Equipment Recommendations  None recommended by PT    Recommendations for Other Services       Precautions / Restrictions Precautions Precautions: Fall Precaution Comments: multiple falls prior to admission, pt also falls at baseline Restrictions Weight Bearing Restrictions: No      Mobility  Bed Mobility Overal bed mobility: Needs Assistance Bed Mobility: Supine to Sit     Supine to sit: Min assist     General bed mobility comments: heavy reliance on bedrail to raise trunk, HOB up ~25*, min A to raise trunk fully    Transfers Overall transfer level: Needs assistance Equipment used: Rolling walker (2 wheeled) Transfers: Sit to/from Stand Sit to Stand: Min guard         General transfer comment: min/guard safety  Ambulation/Gait Ambulation/Gait  assistance: Min assist to mod assist Gait Distance (Feet): 70 Feet Assistive device: Rolling walker (2 wheeled) Gait Pattern/deviations: Step-to pattern;Decreased step length - right;Decreased step length - left;Narrow base of support Gait velocity: decr   General Gait Details: min A for mild unsteadiness and to manage RW, distance limited by fatigue Assist to open L fingers and place L hand on RW.   Stairs            Wheelchair Mobility    Modified Rankin (Stroke Patients Only)       Balance Overall balance assessment: Needs assistance;History of Falls Sitting-balance support: Feet supported Sitting balance-Leahy Scale: Good     Standing balance support: Bilateral upper extremity supported Standing balance-Leahy Scale: Poor Standing balance comment: relies on BUE support                             Pertinent Vitals/Pain Pain Assessment: Faces Faces Pain Scale: Hurts little more Pain Location: all over (sore from falls and OA) Pain Descriptors / Indicators: Sore Pain Intervention(s): Limited activity within patient's tolerance;Monitored during session;Repositioned    Home Living Family/patient expects to be discharged to:: Private residence Living Arrangements: Spouse/significant other Available Help at Discharge: Family;Available 24 hours/day   Home Access: Stairs to enter Entrance Stairs-Rails: Right;Left Entrance Stairs-Number of Steps: 4 Home Layout: One level Home Equipment: Walker - 2 wheels;Tub bench      Prior Function Level of Independence: Needs assistance   Gait / Transfers Assistance Needed: has a RW which he'd recently stopped using  ADL's /  Homemaking Assistance Needed: supervision and assist for set up for shower transfers        Hand Dominance   Dominant Hand: Right    Extremity/Trunk Assessment   Upper Extremity Assessment Upper Extremity Assessment: LUE deficits/detail;Defer to OT evaluation LUE Deficits / Details:  LUE flaccid, non functional, L fingers in flexed position, fingers can be passively extended partially    Lower Extremity Assessment Lower Extremity Assessment: LLE deficits/detail LLE Deficits / Details: SLR 3/5, knee ext 4/5, ankle in inverted contracted position 2* CVA but can actively PF/DF ~25*, reports "tingling" with light touch to L and R foot    Cervical / Trunk Assessment Cervical / Trunk Assessment: Other exceptions Cervical / Trunk Exceptions: maintains head in R rotated position, can actively rotate head to L across midline  Communication   Communication: HOH  Cognition Arousal/Alertness: Awake/alert Behavior During Therapy: WFL for tasks assessed/performed Overall Cognitive Status: Within Functional Limits for tasks assessed                                        General Comments      Exercises     Assessment/Plan    PT Assessment Patient needs continued PT services  PT Problem List Decreased strength;Decreased mobility;Decreased activity tolerance;Decreased balance;Pain;Decreased safety awareness       PT Treatment Interventions Therapeutic activities;Therapeutic exercise;Functional mobility training;Gait training;Patient/family education    PT Goals (Current goals can be found in the Care Plan section)  Acute Rehab PT Goals Patient Stated Goal: to be able to walk safely PT Goal Formulation: With patient/family Time For Goal Achievement: 07/14/20 Potential to Achieve Goals: Good    Frequency Min 3X/week   Barriers to discharge        Co-evaluation               AM-PAC PT "6 Clicks" Mobility  Outcome Measure Help needed turning from your back to your side while in a flat bed without using bedrails?: A Lot Help needed moving from lying on your back to sitting on the side of a flat bed without using bedrails?: A Lot Help needed moving to and from a bed to a chair (including a wheelchair)?: A Little Help needed standing up from a  chair using your arms (e.g., wheelchair or bedside chair)?: A Little Help needed to walk in hospital room?: A Lot Help needed climbing 3-5 steps with a railing? : A Lot 6 Click Score: 14    End of Session Equipment Utilized During Treatment: Gait belt Activity Tolerance: Patient tolerated treatment well Patient left: in chair;with call bell/phone within reach;with chair alarm set;with family/visitor present Nurse Communication: Mobility status PT Visit Diagnosis: Difficulty in walking, not elsewhere classified (R26.2);Repeated falls (R29.6);History of falling (Z91.81);Other abnormalities of gait and mobility (R26.89)    Time: 6237-6283 PT Time Calculation (min) (ACUTE ONLY): 48 min   Charges:   PT Evaluation $PT Eval Moderate Complexity: 1 Mod PT Treatments $Gait Training: 8-22 mins $Therapeutic Activity: 8-22 mins        Blondell Reveal Kistler PT 06/30/2020  Acute Rehabilitation Services Pager [phone] Office [phone]

## 2020-06-30 NOTE — Progress Notes (Signed)
Patient accepted to [facility]. [facility] to assume care when patient arrives at accepting facility.

## 2020-06-30 NOTE — ED Notes (Signed)
Small soft brown stool noted; patient cleaned and assisted back to bed. Gave patient and spouse crackers and snacks.

## 2020-06-30 NOTE — Plan of Care (Signed)
Plan of care initiated and discussed with the patient. 

## 2020-06-30 NOTE — H&P (Addendum)
History and Physical    Mark [name] LTJ:[id number] DOB: 06/23/1945 DOA: 06/29/2020  PCP: Harlan Stains, MD  Patient coming from: Home  Chief Complaint: weakness, fall  HPI: Mark [name] is a 75 y.o. male with medical history significant of epilepsy, CVA w/ left residuals, chronic ITP. Presenting with generalized weakness, falls, and fever. He's seemed weak for the last 4 days or so. He had a fever noted 4 days. He was given OTC meds, but they didn't help his fevers for long. He has had multiple falls as well. They called their PCP yesterday because they were concerned that he was having another UTI (his symptoms were similar to his last UTI). Antibiotics were called in. However the patient did not feel any better. The family decided that he needed to come to the ED. He denies any other aggravating or alleviating factors.    ED Course: He was found to have an elevated Scr and WBC. He had a dirty UA. He was started on fluids, rocephin. TRH was called for admission.   Review of Systems:  Denies CP, palpitations, dyspnea, N/V/D, ab pain. Review of systems is otherwise negative for all not mentioned in HPI.   PMHx Past Medical History:  Diagnosis Date  . Chronic ITP (idiopathic thrombocytopenia) (HCC)   . Chronic ITP (idiopathic thrombocytopenia) (HCC)   . Coronary artery disease 1992   MI  . Difficulty swallowing    pt takes all meds wiht applesauce  . DVT (deep venous thrombosis) ([place])   . Hard of hearing    hearing aids   . 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 Hypertension   . 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   . 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 Multiple closed anterior-posterior compression fractures of pelvis ([place])   . Seizures ([place])    last seizure 10 years ago approx   . Sleep apnea    cpap broken x 1 year per wife   . Stroke [facility]) 02/27/2011   Left side weakness    PSHx Past Surgical History:  Procedure Laterality Date  . blood clot  Right    surgical removal  . CHOLECYSTECTOMY    . CORONARY ANGIOPLASTY  1992  . CYSTOGRAM N/A 02/17/2020   Procedure: CYSTOGRAM;  Surgeon: Ardis Hughs, MD;  Location: [facility] ORS;  Service: Urology;  Laterality: N/A;  . CYSTOSCOPY WITH RETROGRADE PYELOGRAM, URETEROSCOPY AND STENT PLACEMENT Bilateral 02/17/2020   Procedure: Donnajean Lopes  URETEROSCOPY AND STENT PLACEMENT;  Surgeon: Ardis Hughs, MD;  Location: [facility] ORS;  Service: Urology;  Laterality: Bilateral;  . SPLENECTOMY, TOTAL    . vertebralplasty      SocHx  reports that he quit smoking about 35 years ago. His smoking use included cigarettes. He started smoking about 52 years ago. He has a 15.00 pack-year smoking history. He has never used smokeless tobacco. He reports that he does not drink alcohol. No history on file for drug use.  Allergies  Allergen Reactions  . Diphenhydramine     Other reaction(s): affects kidney  . Famotidine     Other reaction(s): affects kidneys    FamHx History reviewed. No pertinent family history.  Prior to Admission medications   Medication Sig Start Date End Date Taking? Authorizing Provider  acetaminophen (TYLENOL) 325 MG tablet Take 650 mg by mouth every morning.    Yes [provider]  amLODipine (NORVASC) 5 MG tablet Take 5 mg by  mouth daily.  02/26/17  Yes [provider]  aspirin EC 81 MG tablet Take 81 mg by mouth daily. Swallow whole.   Yes [provider]  buPROPion (WELLBUTRIN XL) 150 MG 24 hr tablet Take 150 mg by mouth daily.  12/19/14  Yes [provider]  Cholecalciferol (VITAMIN D3) 125 MCG (5000 UT) TABS Take 5,000 Units by mouth every other day.    Yes [provider]  CRESTOR 20 MG tablet Take 20 mg by mouth at bedtime.  11/16/14  Yes [provider]  dexamethasone (DECADRON) 4 MG tablet TAKE 10 TABLETS BY MOUTH ONCE DAILY AS NEEDED FOR 3 DAYS FOR  ITP Patient taking differently: Take 40 mg by  mouth See admin instructions. Take 40 mg by mouth once daily as needed for 3 days for ITP 06/02/19  Yes Ennever, Rudell Cobb, MD  fluticasone [facility]) 50 MCG/ACT nasal spray Place 2 sprays into both nostrils daily as needed for allergies or rhinitis.  06/23/14  Yes [provider]  HYDROcodone-acetaminophen (NORCO) 10-325 MG tablet Take 1 tablet by mouth at bedtime. May take extra as needed for pain 07/08/19  Yes [provider]  levETIRAcetam (KEPPRA) 500 MG tablet Take 1,500 mg by mouth 2 (two) times daily.  12/24/11  Yes [provider]  Melatonin 10 MG TABS Take 10 mg by mouth at bedtime.   Yes [provider]  omeprazole (PRILOSEC) 40 MG capsule Take 40 mg by mouth every morning. 08/01/19  Yes [provider]  polyethylene glycol (MIRALAX / GLYCOLAX) packet Take 17 g by mouth daily as needed. Patient taking differently: Take 17 g by mouth daily as needed for moderate constipation. 10/25/16  Yes Domenic Polite, MD  sertraline (ZOLOFT) 100 MG tablet Take 200 mg by mouth in the morning.    Yes [provider]  XARELTO 20 MG TABS tablet Take 1 tablet by mouth once daily with breakfast Patient taking differently: Take 20 mg by mouth daily with breakfast. 10/21/19  Yes Volanda Napoleon, MD    Physical Exam: Vitals:   06/30/20 0345 06/30/20 0405 06/30/20 0410 06/30/20 0451  BP: 103/62  118/68 116/67  Pulse: 78  78 75  Resp: (!) 30  (!) 26 16  Temp:  98.6 F (37 C)  98.8 F (37.1 C)  TempSrc:  Oral  Oral  SpO2: 95%  98% 99%  Weight:      Height:        General: 75 y.o. male resting in bed in NAD Eyes: PERRL, normal sclera ENMT: Nares patent w/o discharge, orophaynx clear, dentition normal, ears w/o discharge/lesions/ulcers Neck: Supple, trachea midline Cardiovascular: RRR, +S1, S2, no m/g/r, equal pulses throughout Respiratory: CTABL, no w/r/r, normal WOB GI: BS+, NDNT, no masses noted, no organomegaly noted MSK: No e/c/c Skin: No rashes,  ulcerations noted; BLE bruising noted Neuro: A&O x 3, LLE/LUE weakness w/ contracture of left hand as residual from previous stroke Psyc: Appropriate interaction and affect, calm/cooperative  Labs on Admission: I have personally reviewed following labs and imaging studies  CBC: Recent Labs  Lab 06/30/20 0022  WBC 13.8*  NEUTROABS 8.6*  HGB 10.1*  HCT 30.1*  MCV 88.5  PLT 673   Basic Metabolic Panel: Recent Labs  Lab 06/30/20 0022  NA 135  K 3.6  CL 103  CO2 21*  GLUCOSE 105*  BUN 57*  CREATININE 1.72*  CALCIUM 8.8*   GFR: Estimated Creatinine Clearance: 38.3 mL/min (A) (by C-G formula based on SCr  of 1.72 mg/dL (H)). Liver Function Tests: Recent Labs  Lab 06/30/20 0022  AST 35  ALT 43  ALKPHOS 57  BILITOT 0.6  PROT 7.0  ALBUMIN 3.6   No results for input(s): LIPASE, AMYLASE in the last 168 hours. No results for input(s): AMMONIA in the last 168 hours. Coagulation Profile: No results for input(s): INR, PROTIME in the last 168 hours. Cardiac Enzymes: No results for input(s): CKTOTAL, CKMB, CKMBINDEX, TROPONINI in the last 168 hours. BNP (last 3 results) No results for input(s): PROBNP in the last 8760 hours. HbA1C: No results for input(s): HGBA1C in the last 72 hours. CBG: No results for input(s): GLUCAP in the last 168 hours. Lipid Profile: No results for input(s): CHOL, HDL, LDLCALC, TRIG, CHOLHDL, LDLDIRECT in the last 72 hours. Thyroid Function Tests: No results for input(s): TSH, T4TOTAL, FREET4, T3FREE, THYROIDAB in the last 72 hours. Anemia Panel: No results for input(s): VITAMINB12, FOLATE, FERRITIN, TIBC, IRON, RETICCTPCT in the last 72 hours. Urine analysis:    Component Value Date/Time   COLORURINE YELLOW 06/29/2020 2310   APPEARANCEUR HAZY (A) 06/29/2020 2310   LABSPEC 1.010 06/29/2020 2310   PHURINE 6.0 06/29/2020 2310   GLUCOSEU NEGATIVE 06/29/2020 2310   HGBUR SMALL (A) 06/29/2020 2310   BILIRUBINUR NEGATIVE 06/29/2020 2310    KETONESUR NEGATIVE 06/29/2020 2310   PROTEINUR NEGATIVE 06/29/2020 2310   UROBILINOGEN 1.0 04/04/2010 0110   NITRITE NEGATIVE 06/29/2020 2310   LEUKOCYTESUR MODERATE (A) 06/29/2020 2310    Radiological Exams on Admission: DG Chest Port 1 View  Result Date: 06/30/2020 CLINICAL DATA:  Fever and weakness EXAM: PORTABLE CHEST 1 VIEW COMPARISON:  02/04/2017, CT chest 10/29/2019 FINDINGS: No focal opacity or pleural effusion. Normal cardiomediastinal silhouette with aortic atherosclerosis. No pneumothorax. Chronic appearing left fourth rib fracture. Multiple chronic bilateral mid to lower rib fractures IMPRESSION: No active disease. Electronically Signed   By: Donavan Foil M.D.   On: 06/30/2020 00:11    Assessment/Plan Generalized weakness Falls     - place in obs, med-surg     - PT/OT consult     - no LOC, head injury     - likely secondary to infection  AKI     - ? Dehydration     - fluids, check renal US  UTI     - continue rocephin  Normocytic anemia     - no evidence of bleed     - check iron studies  HTN     - resume home regimen  Anxiety/Depression     - resume home regimen  Epilepsy     - resume keppra  HLD     - resume statin  Hx of CVA w/ left side residuals     - resume ASA, statin, xarelto  Chronic ITP     - plts are ok; follow  DVT prophylaxis: xarelto  Code Status: FULL  Family Communication: With dtr by phone  Consults called: None   Status is: Observation  The patient remains OBS appropriate and will d/c before 2 midnights.  Dispo: The patient is from: Home              Anticipated d/c is to: Home              Patient currently is not medically stable to d/c.   Difficult to place patient No  Jonnie Finner DO [facility]  If 7PM-7AM, please contact night-coverage [email or web address]  06/30/2020, 7:19 AM

## 2020-07-01 DIAGNOSIS — R509 Fever, unspecified: Secondary | ICD-10-CM | POA: Diagnosis not present

## 2020-07-01 DIAGNOSIS — Z7901 Long term (current) use of anticoagulants: Secondary | ICD-10-CM | POA: Diagnosis not present

## 2020-07-01 DIAGNOSIS — I69354 Hemiplegia and hemiparesis following cerebral infarction affecting left non-dominant side: Secondary | ICD-10-CM | POA: Diagnosis not present

## 2020-07-01 DIAGNOSIS — N390 Urinary tract infection, site not specified: Secondary | ICD-10-CM | POA: Diagnosis not present

## 2020-07-01 DIAGNOSIS — Z20822 Contact with and (suspected) exposure to covid-19: Secondary | ICD-10-CM | POA: Diagnosis not present

## 2020-07-01 DIAGNOSIS — Z9861 Coronary angioplasty status: Secondary | ICD-10-CM | POA: Diagnosis not present

## 2020-07-01 DIAGNOSIS — N1831 Chronic kidney disease, stage 3a: Secondary | ICD-10-CM

## 2020-07-01 DIAGNOSIS — Z9081 Acquired absence of spleen: Secondary | ICD-10-CM | POA: Diagnosis not present

## 2020-07-01 DIAGNOSIS — N179 Acute kidney failure, unspecified: Secondary | ICD-10-CM | POA: Diagnosis not present

## 2020-07-01 DIAGNOSIS — N136 Pyonephrosis: Secondary | ICD-10-CM | POA: Diagnosis not present

## 2020-07-01 DIAGNOSIS — R296 Repeated falls: Secondary | ICD-10-CM | POA: Diagnosis not present

## 2020-07-01 DIAGNOSIS — D693 Immune thrombocytopenic purpura: Secondary | ICD-10-CM | POA: Diagnosis not present

## 2020-07-01 DIAGNOSIS — Z888 Allergy status to other drugs, medicaments and biological substances status: Secondary | ICD-10-CM | POA: Diagnosis not present

## 2020-07-01 DIAGNOSIS — I2510 Atherosclerotic heart disease of native coronary artery without angina pectoris: Secondary | ICD-10-CM | POA: Diagnosis present

## 2020-07-01 DIAGNOSIS — H9190 Unspecified hearing loss, unspecified ear: Secondary | ICD-10-CM | POA: Diagnosis present

## 2020-07-01 DIAGNOSIS — Z79899 Other long term (current) drug therapy: Secondary | ICD-10-CM | POA: Diagnosis not present

## 2020-07-01 DIAGNOSIS — Z87891 Personal history of nicotine dependence: Secondary | ICD-10-CM | POA: Diagnosis not present

## 2020-07-01 DIAGNOSIS — F32A Depression, unspecified: Secondary | ICD-10-CM | POA: Diagnosis present

## 2020-07-01 DIAGNOSIS — I129 Hypertensive chronic kidney disease with stage 1 through stage 4 chronic kidney disease, or unspecified chronic kidney disease: Secondary | ICD-10-CM | POA: Diagnosis present

## 2020-07-01 DIAGNOSIS — E785 Hyperlipidemia, unspecified: Secondary | ICD-10-CM | POA: Diagnosis present

## 2020-07-01 DIAGNOSIS — N319 Neuromuscular dysfunction of bladder, unspecified: Secondary | ICD-10-CM

## 2020-07-01 DIAGNOSIS — Z7982 Long term (current) use of aspirin: Secondary | ICD-10-CM | POA: Diagnosis not present

## 2020-07-01 DIAGNOSIS — D509 Iron deficiency anemia, unspecified: Secondary | ICD-10-CM | POA: Diagnosis present

## 2020-07-01 DIAGNOSIS — G4730 Sleep apnea, unspecified: Secondary | ICD-10-CM | POA: Diagnosis present

## 2020-07-01 DIAGNOSIS — N183 Chronic kidney disease, stage 3 unspecified: Secondary | ICD-10-CM

## 2020-07-01 DIAGNOSIS — D6862 Lupus anticoagulant syndrome: Secondary | ICD-10-CM | POA: Diagnosis not present

## 2020-07-01 DIAGNOSIS — G40909 Epilepsy, unspecified, not intractable, without status epilepticus: Secondary | ICD-10-CM | POA: Diagnosis not present

## 2020-07-01 LAB — COMPREHENSIVE METABOLIC PANEL
ALT: 68 U/L — ABNORMAL HIGH (ref 0–44)
AST: 55 U/L — ABNORMAL HIGH (ref 15–41)
Albumin: 3.2 g/dL — ABNORMAL LOW (ref 3.5–5.0)
Alkaline Phosphatase: 50 U/L (ref 38–126)
Anion gap: 9 (ref 5–15)
BUN: 41 mg/dL — ABNORMAL HIGH (ref 8–23)
CO2: 21 mmol/L — ABNORMAL LOW (ref 22–32)
Calcium: 8.6 mg/dL — ABNORMAL LOW (ref 8.9–10.3)
Chloride: 111 mmol/L (ref 98–111)
Creatinine, Ser: 1.43 mg/dL — ABNORMAL HIGH (ref 0.61–1.24)
GFR, Estimated: 51 mL/min — ABNORMAL LOW (ref >60)
Glucose, Bld: 111 mg/dL — ABNORMAL HIGH (ref 70–99)
Potassium: 3.9 mmol/L (ref 3.5–5.1)
Sodium: 141 mmol/L (ref 135–145)
Total Bilirubin: 0.5 mg/dL (ref 0.3–1.2)
Total Protein: 6.5 g/dL (ref 6.5–8.1)

## 2020-07-01 LAB — CBC
HCT: 29.6 % — ABNORMAL LOW (ref 39.0–52.0)
Hemoglobin: 9.4 g/dL — ABNORMAL LOW (ref 13.0–17.0)
MCH: 29.6 pg (ref 26.0–34.0)
MCHC: 31.8 g/dL (ref 30.0–36.0)
MCV: 93.1 fL (ref 80.0–100.0)
Platelets: 335 10*3/uL (ref 150–400)
RBC: 3.18 MIL/uL — ABNORMAL LOW (ref 4.22–5.81)
RDW: 15 % (ref 11.5–15.5)
WBC: 14.2 10*3/uL — ABNORMAL HIGH (ref 4.0–10.5)
nRBC: 0 % (ref 0.0–0.2)

## 2020-07-01 LAB — URINE CULTURE: Culture: <10000 — AB

## 2020-07-01 NOTE — Evaluation (Signed)
Occupational Therapy Evaluation Patient Details Name: Mark [name] MRN: [id number] DOB: December 12, 1945 Today's Date: 07/01/2020    History of Present Illness 75 y.o. male with medical history significant of epilepsy, CVA x 2 w/ left residuals, chronic ITP. Presenting with generalized weakness, falls, and fever x 4 days. Dx of AKI, UTI.   Clinical Impression   Patient is currently requiring assistance with ADLs including minimal assist with toileting, moderate assist with LE and UE dressing, minimal to moderate assist with bathing, and minimal to moderate assist with toileting, which is below patient's typical baseline, although pt does report increasing needs of assistance from his spouse, and concern that she is becoming overwhelmed.  During this evaluation, patient was limited by LT hemiparesis with pt stating that his LT sided weakness is worse recently, impaired standing balance and frequent falls at home, all of which has the potential to impact patient's safety and independence during functional mobility, as well as performance for ADLs. Mark [name] "6-clicks" Daily Activity Inpatient Short Form score of 16/24 indicates 53.32% ADL impairment this session. Patient lives with his wife, who is able to provide 24/7 supervision and assistance, and at baseline provides all IADLs and transportation.  Patient demonstrates good rehab potential, and should benefit from continued skilled occupational therapy services while in acute care to maximize safety, independence and quality of life at home.  Continued occupational therapy services in a SNF setting prior to return home is recommended.  ?     Follow Up Recommendations  SNF    Equipment Recommendations       Recommendations for Other Services       Precautions / Restrictions Precautions Precautions: Fall Precaution Comments: multiple falls prior to admission, pt also falls at baseline Restrictions Weight Bearing Restrictions:  No      Mobility Bed Mobility Overal bed mobility: Needs Assistance Bed Mobility: Supine to Sit     Supine to sit: Supervision;HOB elevated     General bed mobility comments: Heavy use of bed rails. Noted once EOB that pt's LT wrist flexed and pt holding self up this way.  Assisted pt to extend wrist and have palmer surface of hand on bed to avoid wrist injury. Pt educated on monitoring position of LUE for safety. Patient Response: Cooperative  Transfers Overall transfer level: Needs assistance Equipment used: Rolling walker (2 wheeled) Transfers: Sit to/from [facility] Sit to Stand: Min assist (x2 reps from EOB.) Stand pivot transfers: Mod assist       General transfer comment: Stand/pivot performed without RW, using RT HHA to better judge how pt has been mobilizing without his RW at home. Increased assistance needed without RW when pivoting from EOB to recliner.    Balance Overall balance assessment: Needs assistance;History of Falls Sitting-balance support: Feet supported;Single extremity supported Sitting balance-Leahy Scale: Good     Standing balance support: Bilateral upper extremity supported Standing balance-Leahy Scale: Poor Standing balance comment: relies on BUE support                           ADL either performed or assessed with clinical judgement   ADL Overall ADL's : Needs assistance/impaired Eating/Feeding: Minimal assistance;Set up;Sitting   Grooming: Oral care;Min guard;Set up;Standing Grooming Details (indicate cue type and reason): Pt educated on compensatory strategies to simplify opening containers and completing grooming with one hand. Pt stood at sink with RW and compelted denture and tooth hygiene with Min guard for task and  balance. Upper Body Bathing: Minimal assistance;Sitting   Lower Body Bathing: Minimal assistance;Sitting/lateral leans;Sit to/from stand   Upper Body Dressing : Moderate assistance Upper Body  Dressing Details (indicate cue type and reason): Pt able to verbalize correct sequence for 1-handed dressing techniques. Currently unable to demo dfue to IV line. Moderate assist to don and doff posterior gown. Lower Body Dressing: Moderate assistance;Sitting/lateral leans;Sit to/from stand   Toilet Transfer: Minimal assistance;BSC;RW   Toileting- Clothing Manipulation and Hygiene: Minimal assistance;Sitting/lateral lean;Sit to/from stand   Tub/ Banker: Moderate assistance;Tub bench   Functional mobility during ADLs: Minimal assistance;Moderate assistance;Rolling walker       Vision Patient Visual Report: No change from baseline       Perception     Praxis      Pertinent Vitals/Pain Pain Assessment: 0-10 Pain Score: 6  Pain Location: Pt reports chronic neck, back and RT hip pain from OA and falls. Pt reports that 5-6/10 pain level is normal for him. Pain Descriptors / Indicators: Sore Pain Intervention(s): Limited activity within patient's tolerance;Monitored during session;Repositioned     Hand Dominance Right   Extremity/Trunk Assessment Upper Extremity Assessment Upper Extremity Assessment: LUE deficits/detail (RUE WNL) LUE Deficits / Details: LUE with no AROM except using tone to flex fingers, and shows increased tone throughout limb with tight finger flexors and unable to stretch beyond partial extension. +Wrist drop.  Pt reports that his LUE feels weaker than baseline lately. LUE Sensation: decreased light touch;decreased proprioception   Lower Extremity Assessment Lower Extremity Assessment: Defer to PT evaluation   Cervical / Trunk Assessment Cervical / Trunk Assessment: Other exceptions Cervical / Trunk Exceptions: maintains head in R rotated position, can actively rotate head to L across midline   Communication Communication Communication: HOH   Cognition Arousal/Alertness: Awake/alert Behavior During Therapy: WFL for tasks  assessed/performed Overall Cognitive Status: Within Functional Limits for tasks assessed                                     General Comments       Exercises     Shoulder Instructions      Home Living Family/patient expects to be discharged to:: Private residence Living Arrangements: Spouse/significant other Available Help at Discharge: Family;Available 24 hours/day;Other (Comment) (Pt reports his spouse is on disability and can provide light assistance. Pt also with suportive daughter and sister nearby.) Type of Home: House Home Access: Stairs to enter [employer] of Steps: 4 Entrance Stairs-Rails: Right;Left Home Layout: One level     Bathroom Shower/Tub: Teacher, early years/pre: Standard     Home Equipment: Environmental consultant - 2 wheels;Tub bench;Cane - single point;Cane - quad;Wheelchair - manual;Hand held shower head   Additional Comments: Pt had a LT platform walker that broke. Pt stated that his daughter has ordered a new one but it hass not arrived yet. Pt reports that he has been furniture cruising while waiting for new walker. Pt also has a hemi-walker. He "gave up" on his LT AFO due to poor fit and mulitple PT and MD appointments trying to resolve it.  Wheelchair is transport chair style.      Prior Functioning/Environment Level of Independence: Needs assistance  Gait / Transfers Assistance Needed: Awaiting new RW ADL's / Homemaking Assistance Needed: supervision and assist for set up for shower transfers.  Spouse assists with LE and UE dressing and provides all IADLs.  Pt reports  concern that his wife has been overwhelmed with his care recently.            OT Problem List: Decreased strength;Impaired balance (sitting and/or standing);Impaired UE functional use;Impaired tone;Impaired sensation      OT Treatment/Interventions: Self-care/ADL training;Therapeutic exercise;Therapeutic activities;Neuromuscular education;Energy  conservation;DME and/or AE instruction;Patient/family education;Balance training    OT Goals(Current goals can be found in the care plan section) Acute Rehab OT Goals Patient Stated Goal: "To maintain normal patterns of movement as much as possible." OT Goal Formulation: With patient/family Time For Goal Achievement: 07/15/20 Potential to Achieve Goals: Good ADL Goals Pt Will Perform Grooming: standing;with supervision Pt Will Perform Lower Body Dressing: with min guard assist;with set-up;sitting/lateral leans;sit to/from stand Pt Will Transfer to Toilet: with supervision;regular height toilet;ambulating Pt Will Perform Toileting - Clothing Manipulation and hygiene: with adaptive equipment;with modified independence;sitting/lateral leans Pt Will Perform Tub/Shower Transfer: with min guard assist;tub bench Additional ADL Goal #1: Pt will verbalize at least 3 fall prevention strategies to prevent continued falls at home and avoid rehospitalization.  OT Frequency: Min 2X/week   Barriers to D/C:    Pt's spouse on disability and provides light assistance only.       Co-evaluation              AM-PAC OT "6 Clicks" Daily Activity     Outcome Measure Help from another person eating meals?: A Little Help from another person taking care of personal grooming?: A Little Help from another person toileting, which includes using toliet, bedpan, or urinal?: A Little Help from another person bathing (including washing, rinsing, drying)?: A Little Help from another person to put on and taking off regular upper body clothing?: A Lot Help from another person to put on and taking off regular lower body clothing?: A Lot 6 Click Score: 16   End of Session Equipment Utilized During Treatment: Gait belt;Rolling walker Nurse Communication: Mobility status  Activity Tolerance: Patient tolerated treatment well Patient left: in chair;with call bell/phone within reach;with chair alarm set;with  family/visitor present  OT Visit Diagnosis: Unsteadiness on feet (R26.81);Hemiplegia and hemiparesis;History of falling (Z91.81);Repeated falls (R29.6) Hemiplegia - Right/Left: Left Hemiplegia - dominant/non-dominant: Non-Dominant Hemiplegia - caused by: Cerebral infarction                Time: 1011-1100 OT Time Calculation (min): 49 min Charges:  OT General Charges $OT Visit: 1 Visit OT Evaluation $OT Eval Low Complexity: 1 Low OT Treatments $Self Care/Home Management : 8-22 mins $Therapeutic Activity: 8-22 mins  Mark [name], [place] Office: [phone] 07/01/2020  Mark [name] 07/01/2020, 1:30 PM

## 2020-07-01 NOTE — Progress Notes (Signed)
Physical Therapy Treatment Patient Details Name: Mark [name] MRN: [id number] DOB: [birth year] Today's Date: 07/01/2020    History of Present Illness 75 y.o. male with medical history significant of epilepsy, CVA x 2 w/ left residuals, chronic ITP. Presenting with generalized weakness, falls, and fever x 4 days. Dx of AKI, UTI.    PT Comments    Pt assisted with ambulating in hallway.  Pt typically uses platform walker at home however awaiting new walker. Pt also states he was using AFO however now does not, and pt encouraged to f/u with this for safety and stability during mobility.  Pt continues to require at least min assist during ambulation so continue to recommend SNF upon d/c.     Follow Up Recommendations  SNF     Equipment Recommendations  None recommended by PT    Recommendations for Other Services       Precautions / Restrictions Precautions Precautions: Fall Precaution Comments: multiple falls prior to admission, pt also falls at baseline Restrictions Weight Bearing Restrictions: No    Mobility  Bed Mobility Overal bed mobility: Needs Assistance Bed Mobility: Supine to Sit     Supine to sit: Supervision;HOB elevated     General bed mobility comments: pt exiting bed on right side    Transfers Overall transfer level: Needs assistance Equipment used: Rolling walker (2 wheeled) Transfers: Sit to/from Stand Sit to Stand: Min guard Stand pivot transfers: Mod assist       General transfer comment: min/guard for safety, pt reliant on posterior lean on bed to assist self upright  Ambulation/Gait Ambulation/Gait assistance: Min assist Gait Distance (Feet): 90 Feet Assistive device: Rolling walker (2 wheeled) Gait Pattern/deviations: Step-to pattern;Decreased step length - right;Decreased step length - left;Narrow base of support Gait velocity: decr   General Gait Details: assist for stabilizing, decreased clearance of left foot due to decreased knee  flexion and foot plantar flexion and supination during swing, assist for left wrist/hand positioning on RW   Stairs             Wheelchair Mobility    Modified Rankin (Stroke Patients Only)       Balance Overall balance assessment: Needs assistance;History of Falls Sitting-balance support: Feet supported;Single extremity supported Sitting balance-Leahy Scale: Good     Standing balance support: Bilateral upper extremity supported Standing balance-Leahy Scale: Poor Standing balance comment: relies on BUE support                            Cognition Arousal/Alertness: Awake/alert Behavior During Therapy: WFL for tasks assessed/performed Overall Cognitive Status: Within Functional Limits for tasks assessed                                        Exercises      General Comments General comments (skin integrity, edema, etc.): Per OT, "Pt had a LT platform walker that broke. Pt stated that his daughter has ordered a new one but it hass not arrived yet. Pt reports that he has been furniture cruising while waiting for new walker. Pt also has a hemi-walker. He "gave up" on his LT AFO due to poor fit and mulitple PT and MD appointments trying to resolve it."  Pt also states similar to PT during session.      Pertinent Vitals/Pain Pain Assessment: 0-10 Pain Score: 6  Pain Location: Pt  reports chronic neck, back and RT hip pain from OA and falls Pain Descriptors / Indicators: Sore Pain Intervention(s): Repositioned;Monitored during session    Home Living Family/patient expects to be discharged to:: Private residence Living Arrangements: Spouse/significant other Available Help at Discharge: Family;Available 24 hours/day;Other (Comment) (Pt reports his spouse is on disability and can provide light assistance. Pt also with suportive daughter and sister nearby.) Type of Home: House Home Access: Stairs to enter Entrance Stairs-Rails: Right;Left Home  Layout: One level Home Equipment: Environmental consultant - 2 wheels;Tub bench;Cane - single point;Cane - quad;Wheelchair - manual;Hand held shower head Additional Comments: Pt had a LT platform walker that broke. Pt stated that his daughter has ordered a new one but it hass not arrived yet. Pt reports that he has been furniture cruising while waiting for new walker. Pt also has a hemi-walker. He "gave up" on his LT AFO due to poor fit and mulitple PT and MD appointments trying to resolve it.  Wheelchair is transport chair style.    Prior Function Level of Independence: Needs assistance  Gait / Transfers Assistance Needed: Awaiting new RW ADL's / Homemaking Assistance Needed: supervision and assist for set up for shower transfers.  Spouse assists with LE and UE dressing and provides all IADLs.  Pt reports concern that his wife has been overwhelmed with his care recently.     PT Goals (current goals can now be found in the care plan section) Acute Rehab PT Goals Patient Stated Goal: "To maintain normal patterns of movement as much as possible." Progress towards PT goals: Progressing toward goals    Frequency    Min 2X/week      PT Plan      Co-evaluation              AM-PAC PT "6 Clicks" Mobility   Outcome Measure  Help needed turning from your back to your side while in a flat bed without using bedrails?: A Little Help needed moving from lying on your back to sitting on the side of a flat bed without using bedrails?: A Little Help needed moving to and from a bed to a chair (including a wheelchair)?: A Little Help needed standing up from a chair using your arms (e.g., wheelchair or bedside chair)?: A Little Help needed to walk in hospital room?: A Lot Help needed climbing 3-5 steps with a railing? : A Lot 6 Click Score: 16    End of Session Equipment Utilized During Treatment: Gait belt Activity Tolerance: Patient tolerated treatment well Patient left: in chair;with call bell/phone within  reach;with chair alarm set Nurse Communication: Mobility status PT Visit Diagnosis: Difficulty in walking, not elsewhere classified (R26.2);History of falling (Z91.81);Other abnormalities of gait and mobility (R26.89)     Time: 6503-5465 PT Time Calculation (min) (ACUTE ONLY): 19 min  Charges:  $Gait Training: 8-22 mins                     Jannette Spanner PT, DPT Acute Rehabilitation Services Pager: [phone] Office: [phone]  York Ram E 07/01/2020, 4:36 PM

## 2020-07-01 NOTE — Plan of Care (Signed)
  Problem: Nutrition: Goal: Adequate nutrition will be maintained Outcome: Progressing   Problem: Pain Managment: Goal: General experience of comfort will improve Outcome: Progressing   

## 2020-07-01 NOTE — Progress Notes (Signed)
PROGRESS NOTE    Mark [name]  WVP:[id number] DOB: [birth year] DOA: 06/29/2020 PCP: Harlan Stains, MD   Brief Narrative: Mark [name] is a 75 y.o. 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 Patient presented secondary to multiple falls and weakness with evidence of AKI and possible UTI on admission   Assessment & Plan:   Active Problems:   AKI (acute kidney injury) ([place])   AKI on CKD stage IIIa Likely secondary to poor oral intake. Baseline creatinine of around 1.2-1.3. Creatinine on admission of 1.72. Started on IV fluids. Improvement of creatinine. -Continue IV fluids -BMP in AM  Possible UTI In setting of neurogenic bladder. Urinalysis significant for few bacteria and moderate leukocytes. Urine culture obtained and is pending. Started empirically on Ceftriaxone -Continue Ceftriaxone IV -Follow up urine culture  Neurogenic bladder Patient self-catheterizes. Follows with urology as an outpatient. -In-out catheterization q6 hours  Weakness Multiple falls Progressively worsened. Possibly exacerbated by infection. PT recommending SNF.  Leukocytosis Mild. Possibly related to UTI. -CBC in AM  ITP Refractory. Platelet count normal  History of CVA Left sided hemiparesis and facial droop. Stable. Has a history of positive lupus anticoagulant and DVT -Continue Xarelto  Primary hypertension -Continue amlodipine  Hyperlipidemia -Continue Crestor  Depression -Continue Wellbutrin XL and Zoloft  Epilepsy -Continue Keppra   DVT prophylaxis: Xarelto Code Status:   Code Status: Full Code Family Communication: None at bedside. Patient decline for me to call family. Disposition Plan: Discharge to SNF when bed available and pending urine culture results.   Consultants:   None  Procedures:   None  Antimicrobials:  Ceftriaxone    Subjective: No concerns today.  Objective: Vitals:    06/30/20 0934 06/30/20 1400 06/30/20 2140 07/01/20 0600  BP: 134/66 120/67 128/63 122/69  Pulse: 78 74 69 68  Resp: 16 18 20 16   Temp: 97.6 F (36.4 C) 98.7 F (37.1 C) 99.7 F (37.6 C) 98.7 F (37.1 C)  TempSrc:   Oral Oral  SpO2: 100% 97% 96% 99%  Weight:      Height:        Intake/Output Summary (Last 24 hours) at 07/01/2020 1309 Last data filed at 07/01/2020 1038 Gross per 24 hour  Intake 2540 ml  Output 2950 ml  Net -410 ml   Filed Weights   06/29/20 2159  Weight: 85.7 kg    Examination:  General exam: Appears calm and comfortable Respiratory system: Clear to auscultation. Respiratory effort normal. Cardiovascular system: S1 & S2 heard, RRR. No murmurs, rubs, gallops or clicks. Gastrointestinal system: Abdomen is nondistended, soft and nontender. No organomegaly or masses felt. Normal bowel sounds heard. Central nervous system: Alert and oriented to person, place and time. No ne wfocal neurological deficits. Left sided hemiparesis, worse in upper extremity with flaccid paralysis of LUE Musculoskeletal: No edema. No calf tenderness Skin: No cyanosis. No rashes Psychiatry: Judgement and insight appear normal. Mood & affect appropriate.     Data Reviewed: I have personally reviewed following labs and imaging studies  CBC Lab Results  Component Value Date   WBC 14.2 (H) 07/01/2020   RBC 3.18 (L) 07/01/2020   HGB 9.4 (L) 07/01/2020   HCT 29.6 (L) 07/01/2020   MCV 93.1 07/01/2020   MCH 29.6 07/01/2020   PLT 335 07/01/2020   MCHC 31.8 07/01/2020   RDW 15.0 07/01/2020   LYMPHSABS 2.3 06/30/2020   MONOABS 2.2 (H) 06/30/2020   EOSABS 0.5 06/30/2020  BASOSABS 0.1 81/19/1478     Last metabolic panel Lab Results  Component Value Date   NA 141 07/01/2020   K 3.9 07/01/2020   CL 111 07/01/2020   CO2 21 (L) 07/01/2020   BUN 41 (H) 07/01/2020   CREATININE 1.43 (H) 07/01/2020   GLUCOSE 111 (H) 07/01/2020   GFRNONAA 51 (L) 07/01/2020   GFRAA >60 06/02/2019    CALCIUM 8.6 (L) 07/01/2020   PHOS 2.5 03/18/2007   PROT 6.5 07/01/2020   ALBUMIN 3.2 (L) 07/01/2020   LABGLOB 3.1 06/05/2016   AGRATIO 1.5 06/05/2016   BILITOT 0.5 07/01/2020   ALKPHOS 50 07/01/2020   AST 55 (H) 07/01/2020   ALT 68 (H) 07/01/2020   ANIONGAP 9 07/01/2020    CBG (last 3)  No results for input(s): GLUCAP in the last 72 hours.   GFR: Estimated Creatinine Clearance: 46.1 mL/min (A) (by C-G formula based on SCr of 1.43 mg/dL (H)).  Coagulation Profile: No results for input(s): INR, PROTIME in the last 168 hours.  Recent Results (from the past 240 hour(s))  Urine culture     Status: Abnormal   Collection Time: 06/29/20 11:10 PM   Specimen: Urine, Catheterized  Result Value Ref Range Status   Specimen Description   Final    URINE, CATHETERIZED Performed at [facility], [address] [place], [place] [address]    Special Requests   Final    NONE Performed at [facility], [address] [place], [place] [address]    Culture (A)  Final    <10,000 COLONIES/mL INSIGNIFICANT GROWTH Performed at [facility], [place] [address] [place], [place] [address]    Report Status 07/01/2020 FINAL  Final  Resp Panel by RT-PCR (Flu A&B, Covid) Nasopharyngeal Swab     Status: None   Collection Time: 06/30/20 12:22 AM   Specimen: Nasopharyngeal Swab; Nasopharyngeal(NP) swabs in vial transport medium  Result Value Ref Range Status   SARS Coronavirus 2 by RT PCR NEGATIVE NEGATIVE Final    Comment: (NOTE) SARS-CoV-2 target nucleic acids are NOT DETECTED.  The SARS-CoV-2 RNA is generally detectable in upper respiratory specimens during the acute phase of infection. The lowest concentration of SARS-CoV-2 viral copies this assay can detect is 138 copies/mL. A negative result does not preclude SARS-Cov-2 infection and should not be used as the sole basis for treatment or other patient management decisions. A negative result may occur with  improper  specimen collection/handling, submission of specimen other than nasopharyngeal swab, presence of viral mutation(s) within the areas targeted by this assay, and inadequate number of viral copies(<138 copies/mL). A negative result must be combined with clinical observations, patient history, and epidemiological information. The expected result is Negative.  Fact Sheet for Patients:  EntrepreneurPulse.com.au  Fact Sheet for Healthcare Providers:  IncredibleEmployment.be  This test is no t yet approved or cleared by the [place] FDA and  has been authorized for detection and/or diagnosis of SARS-CoV-2 by FDA under an Emergency Use Authorization (EUA). This EUA will remain  in effect (meaning this test can be used) for the duration of the COVID-19 declaration under Section 564(b)(1) of the Act, 21 U.S.C.section 360bbb-3(b)(1), unless the authorization is terminated  or revoked sooner.       Influenza A by PCR NEGATIVE NEGATIVE Final   Influenza B by PCR NEGATIVE NEGATIVE Final    Comment: (NOTE) The Xpert Xpress SARS-CoV-2/FLU/RSV plus assay is intended as an aid in the diagnosis  of influenza from Nasopharyngeal swab specimens and should not be used as a sole basis for treatment. Nasal washings and aspirates are unacceptable for Xpert Xpress SARS-CoV-2/FLU/RSV testing.  Fact Sheet for Patients: EntrepreneurPulse.com.au  Fact Sheet for Healthcare Providers: IncredibleEmployment.be  This test is not yet approved or cleared by the [place] FDA and has been authorized for detection and/or diagnosis of SARS-CoV-2 by FDA under an Emergency Use Authorization (EUA). This EUA will remain in effect (meaning this test can be used) for the duration of the COVID-19 declaration under Section 564(b)(1) of the Act, 21 U.S.C. section 360bbb-3(b)(1), unless the authorization is terminated or revoked.  Performed at  [facility], [address] [place], [place] [address]   Culture, blood (routine x 2)     Status: None (Preliminary result)   Collection Time: 06/30/20 12:24 AM   Specimen: BLOOD  Result Value Ref Range Status   Specimen Description   Final    BLOOD Blood Culture adequate volume Performed at [facility], [address] [place], [place] [address]    Special Requests   Final    BOTTLES DRAWN AEROBIC AND ANAEROBIC LEFT ANTECUBITAL Performed at [facility], [address] [place], [place] [address]    Culture   Final    NO GROWTH 1 DAY Performed at [facility], [place] [address] [place], [place] [address]    Report Status PENDING  Incomplete  Culture, blood (routine x 2)     Status: None (Preliminary result)   Collection Time: 06/30/20 12:43 AM   Specimen: BLOOD  Result Value Ref Range Status   Specimen Description   Final    BLOOD Blood Culture adequate volume Performed at [facility], [address] [place], [place] [address]    Special Requests   Final    BOTTLES DRAWN AEROBIC AND ANAEROBIC RIGHT ANTECUBITAL Performed at [facility], [address] [place], [place] [address]    Culture   Final    NO GROWTH 1 DAY Performed at [facility], [place] [address], [place], [place] [address]    Report Status PENDING  Incomplete        Radiology Studies: US RENAL  Result Date: 06/30/2020 CLINICAL DATA:  Acute kidney injury. EXAM: RENAL / URINARY TRACT ULTRASOUND COMPLETE COMPARISON:  January 13, 2020. FINDINGS: Right Kidney: Renal measurements: 14.2 x 6.8 x 6.5 cm = volume: 328 mL. Echogenicity within normal limits. Moderate hydronephrosis is again noted. 8 mm simple cyst is seen in midpole. Left Kidney: Renal measurements: 13.7 x 6.7 x 6.5 cm = volume: 314 mL. Echogenicity within normal limits. Moderate hydronephrosis is again noted. No mass is noted. Bladder: Appears normal for degree of bladder  distention. Right ureteral jet is visualized. Other: None. IMPRESSION: Moderate bilateral hydronephrosis is again noted. Electronically Signed   By: Marijo Conception M.D.   On: 06/30/2020 09:47   DG Chest Port 1 View  Result Date: 06/30/2020 CLINICAL DATA:  Fever and weakness EXAM: PORTABLE CHEST 1 VIEW COMPARISON:  02/04/2017, CT chest 10/29/2019 FINDINGS: No focal opacity or pleural effusion. Normal cardiomediastinal silhouette with aortic atherosclerosis. No pneumothorax. Chronic appearing left fourth rib fracture. Multiple chronic bilateral mid to lower rib fractures IMPRESSION: No active disease. Electronically Signed   By: Donavan Foil M.D.   On: 06/30/2020 00:11        Scheduled Meds: . amLODipine  5 mg Oral  Daily  . aspirin EC  81 mg Oral Daily  . buPROPion  150 mg Oral Daily  . cholecalciferol  5,000 Units Oral QODAY  . levETIRAcetam  1,500 mg Oral BID  . melatonin  10 mg Oral QHS  . rivaroxaban  20 mg Oral Q supper  . rosuvastatin  20 mg Oral QHS  . sertraline  200 mg Oral q AM   Continuous Infusions: . sodium chloride 75 mL/hr at 06/30/20 2230  . cefTRIAXone (ROCEPHIN)  IV Stopped (06/30/20 2301)     LOS: 0 days     Cordelia Poche, MD [facility] 07/01/2020, 1:09 PM  If 7PM-7AM, please contact night-coverage [email or web address]

## 2020-07-02 LAB — BASIC METABOLIC PANEL
Anion gap: 8 (ref 5–15)
BUN: 24 mg/dL — ABNORMAL HIGH (ref 8–23)
CO2: 23 mmol/L (ref 22–32)
Calcium: 8.6 mg/dL — ABNORMAL LOW (ref 8.9–10.3)
Chloride: 111 mmol/L (ref 98–111)
Creatinine, Ser: 1.19 mg/dL (ref 0.61–1.24)
GFR, Estimated: >60 mL/min (ref >60)
Glucose, Bld: 116 mg/dL — ABNORMAL HIGH (ref 70–99)
Potassium: 3.7 mmol/L (ref 3.5–5.1)
Sodium: 142 mmol/L (ref 135–145)

## 2020-07-02 LAB — CBC
HCT: 30.7 % — ABNORMAL LOW (ref 39.0–52.0)
Hemoglobin: 9.7 g/dL — ABNORMAL LOW (ref 13.0–17.0)
MCH: 29.3 pg (ref 26.0–34.0)
MCHC: 31.6 g/dL (ref 30.0–36.0)
MCV: 92.7 fL (ref 80.0–100.0)
Platelets: 399 10*3/uL (ref 150–400)
RBC: 3.31 MIL/uL — ABNORMAL LOW (ref 4.22–5.81)
RDW: 15.2 % (ref 11.5–15.5)
WBC: 13.9 10*3/uL — ABNORMAL HIGH (ref 4.0–10.5)
nRBC: 0 % (ref 0.0–0.2)

## 2020-07-02 NOTE — Plan of Care (Signed)
  Problem: Education: Goal: Knowledge of General Education information will improve Description Including pain rating scale, medication(s)/side effects and non-pharmacologic comfort measures Outcome: Progressing   Problem: Health Behavior/Discharge Planning: Goal: Ability to manage health-related needs will improve Outcome: Progressing   

## 2020-07-02 NOTE — Plan of Care (Signed)

## 2020-07-02 NOTE — Progress Notes (Addendum)
PROGRESS NOTE    Mark [name]  VZD:[id number] DOB: May 31, 1945 DOA: 06/29/2020 PCP: Harlan Stains, MD   Brief Narrative: Mark [name] is a 75 y.o. 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 Patient presented secondary to multiple falls and weakness with evidence of AKI and possible UTI on admission. Urine cultures with insignificant growth. AKI resolved with IV fluids.   Assessment & Plan:   Active Problems:   AKI (acute kidney injury) ([place])   CKD (chronic kidney disease), stage III ([facility])   Neurogenic bladder   AKI on CKD stage IIIa Likely secondary to poor oral intake. Baseline creatinine of around 1.2-1.3. Creatinine on admission of 1.72. Started on IV fluids. Improvement of creatinine and is back down to baseline -Discontinue IV fluids  Possible UTI In setting of neurogenic bladder. Urinalysis significant for few bacteria and moderate leukocytes. Urine culture obtained and is significant for insignificant growth. Started empirically on Ceftriaxone on admission. -Discontinue Ceftriaxone IV  Neurogenic bladder Patient self-catheterizes. Follows with urology as an outpatient. -In-out catheterization q6 hours  Weakness Multiple falls Progressively worsened. Possibly exacerbated by infection. PT recommending SNF.  Leukocytosis Mild. No overt signs of infection. Repeat CBC as an outpatient.  ITP Refractory. Platelet count normal  History of CVA Left sided hemiparesis and facial droop. Stable. Has a history of positive lupus anticoagulant and DVT -Continue Xarelto  Primary hypertension -Continue amlodipine  Hyperlipidemia -Continue Crestor  Depression -Continue Wellbutrin XL and Zoloft  Epilepsy -Continue Keppra   DVT prophylaxis: Xarelto Code Status:   Code Status: Full Code Family Communication: None at bedside. Patient declines for me to call family Disposition Plan: Discharge to SNF when  bed is available. Medically stable for discharge.   Consultants:   None  Procedures:   None  Antimicrobials:  Ceftriaxone    Subjective: No issues this morning. Did have some incontinence issues. He reports this is a chronic issue for which he wears depends at home.  Objective: Vitals:   07/01/20 0600 07/01/20 1400 07/01/20 2119 07/02/20 0535  BP: 122/69 130/68 (!) 153/74 (!) 151/80  Pulse: 68 67 76 68  Resp: 16 16 18 18   Temp: 98.7 F (37.1 C) 98.3 F (36.8 C) 98.8 F (37.1 C) 99 F (37.2 C)  TempSrc: Oral Oral Oral   SpO2: 99% 97% 99% 99%  Weight:      Height:        Intake/Output Summary (Last 24 hours) at 07/02/2020 1123 Last data filed at 07/02/2020 0600 Gross per 24 hour  Intake 1660 ml  Output 1600 ml  Net 60 ml   Filed Weights   06/29/20 2159  Weight: 85.7 kg    Examination:  General exam: Appears calm and comfortable Respiratory system: Clear to auscultation. Respiratory effort normal. Cardiovascular system: S1 & S2 heard, RRR. No murmurs, rubs, gallops or clicks. Gastrointestinal system: Abdomen is nondistended, soft and nontender. No organomegaly or masses felt. Normal bowel sounds heard. Central nervous system: Alert. Left hemiparesis Musculoskeletal: No edema. No calf tenderness Skin: No cyanosis. No rashes    Data Reviewed: I have personally reviewed following labs and imaging studies  CBC Lab Results  Component Value Date   WBC 13.9 (H) 07/02/2020   RBC 3.31 (L) 07/02/2020   HGB 9.7 (L) 07/02/2020   HCT 30.7 (L) 07/02/2020   MCV 92.7 07/02/2020   MCH 29.3 07/02/2020   PLT 399 07/02/2020   MCHC 31.6 07/02/2020   RDW  15.2 07/02/2020   LYMPHSABS 2.3 06/30/2020   MONOABS 2.2 (H) 06/30/2020   EOSABS 0.5 06/30/2020   BASOSABS 0.1 54/00/8676     Last metabolic panel Lab Results  Component Value Date   NA 142 07/02/2020   K 3.7 07/02/2020   CL 111 07/02/2020   CO2 23 07/02/2020   BUN 24 (H) 07/02/2020   CREATININE 1.19  07/02/2020   GLUCOSE 116 (H) 07/02/2020   GFRNONAA >60 07/02/2020   GFRAA >60 06/02/2019   CALCIUM 8.6 (L) 07/02/2020   PHOS 2.5 03/18/2007   PROT 6.5 07/01/2020   ALBUMIN 3.2 (L) 07/01/2020   LABGLOB 3.1 06/05/2016   AGRATIO 1.5 06/05/2016   BILITOT 0.5 07/01/2020   ALKPHOS 50 07/01/2020   AST 55 (H) 07/01/2020   ALT 68 (H) 07/01/2020   ANIONGAP 8 07/02/2020    CBG (last 3)  No results for input(s): GLUCAP in the last 72 hours.   GFR: Estimated Creatinine Clearance: 55.4 mL/min (by C-G formula based on SCr of 1.19 mg/dL).  Coagulation Profile: No results for input(s): INR, PROTIME in the last 168 hours.  Recent Results (from the past 240 hour(s))  Urine culture     Status: Abnormal   Collection Time: 06/29/20 11:10 PM   Specimen: Urine, Catheterized  Result Value Ref Range Status   Specimen Description   Final    URINE, CATHETERIZED Performed at [facility], [address] [place], [place] [address]    Special Requests   Final    NONE Performed at [facility], [address] [place], [place] [address]    Culture (A)  Final    <10,000 COLONIES/mL INSIGNIFICANT GROWTH Performed at [facility], [place] [address] [place], [place] [address]    Report Status 07/01/2020 FINAL  Final  Resp Panel by RT-PCR (Flu A&B, Covid) Nasopharyngeal Swab     Status: None   Collection Time: 06/30/20 12:22 AM   Specimen: Nasopharyngeal Swab; Nasopharyngeal(NP) swabs in vial transport medium  Result Value Ref Range Status   SARS Coronavirus 2 by RT PCR NEGATIVE NEGATIVE Final    Comment: (NOTE) SARS-CoV-2 target nucleic acids are NOT DETECTED.  The SARS-CoV-2 RNA is generally detectable in upper respiratory specimens during the acute phase of infection. The lowest concentration of SARS-CoV-2 viral copies this assay can detect is 138 copies/mL. A negative result does not preclude SARS-Cov-2 infection and should not be used as the sole basis for treatment  or other patient management decisions. A negative result may occur with  improper specimen collection/handling, submission of specimen other than nasopharyngeal swab, presence of viral mutation(s) within the areas targeted by this assay, and inadequate number of viral copies(<138 copies/mL). A negative result must be combined with clinical observations, patient history, and epidemiological information. The expected result is Negative.  Fact Sheet for Patients:  EntrepreneurPulse.com.au  Fact Sheet for Healthcare Providers:  IncredibleEmployment.be  This test is no t yet approved or cleared by the [place] FDA and  has been authorized for detection and/or diagnosis of SARS-CoV-2 by FDA under an Emergency Use Authorization (EUA). This EUA will remain  in effect (meaning this test can be used) for the duration of the COVID-19 declaration under Section 564(b)(1) of the Act, 21 U.S.C.section 360bbb-3(b)(1), unless the authorization is terminated  or revoked sooner.       Influenza A by PCR NEGATIVE NEGATIVE Final   Influenza B by PCR NEGATIVE NEGATIVE Final    Comment: (  NOTE) The Xpert Xpress SARS-CoV-2/FLU/RSV plus assay is intended as an aid in the diagnosis of influenza from Nasopharyngeal swab specimens and should not be used as a sole basis for treatment. Nasal washings and aspirates are unacceptable for Xpert Xpress SARS-CoV-2/FLU/RSV testing.  Fact Sheet for Patients: EntrepreneurPulse.com.au  Fact Sheet for Healthcare Providers: IncredibleEmployment.be  This test is not yet approved or cleared by the [place] FDA and has been authorized for detection and/or diagnosis of SARS-CoV-2 by FDA under an Emergency Use Authorization (EUA). This EUA will remain in effect (meaning this test can be used) for the duration of the COVID-19 declaration under Section 564(b)(1) of the Act, 21 U.S.C. section  360bbb-3(b)(1), unless the authorization is terminated or revoked.  Performed at [facility], [address] [place], [place] [address]   Culture, blood (routine x 2)     Status: None (Preliminary result)   Collection Time: 06/30/20 12:24 AM   Specimen: BLOOD  Result Value Ref Range Status   Specimen Description   Final    BLOOD Blood Culture adequate volume Performed at [facility], [address] [place], [place] [address]    Special Requests   Final    BOTTLES DRAWN AEROBIC AND ANAEROBIC LEFT ANTECUBITAL Performed at [facility], [address] [place], [place] [address]    Culture   Final    NO GROWTH 1 DAY Performed at [facility], [place] [address], [place], [place] [address]    Report Status PENDING  Incomplete  Culture, blood (routine x 2)     Status: None (Preliminary result)   Collection Time: 06/30/20 12:43 AM   Specimen: BLOOD  Result Value Ref Range Status   Specimen Description   Final    BLOOD Blood Culture adequate volume Performed at [facility], [address] [place], [place] [address]    Special Requests   Final    BOTTLES DRAWN AEROBIC AND ANAEROBIC RIGHT ANTECUBITAL Performed at [facility], [address] [place], [place] [address]    Culture   Final    NO GROWTH 1 DAY Performed at [facility], [place] [address], [place], [place] [address]    Report Status PENDING  Incomplete        Radiology Studies: No results found.      Scheduled Meds: . amLODipine  5 mg Oral Daily  . aspirin EC  81 mg Oral Daily  . buPROPion  150 mg Oral Daily  . cholecalciferol  5,000 Units Oral QODAY  . levETIRAcetam  1,500 mg Oral BID  . melatonin  10 mg Oral QHS  . rivaroxaban  20 mg Oral Q supper  . rosuvastatin  20 mg Oral QHS  . sertraline  200 mg Oral q AM   Continuous Infusions: . sodium chloride 75 mL/hr at 07/02/20 0600  . cefTRIAXone (ROCEPHIN)  IV 1 g (07/01/20 2215)      LOS: 1 day     Cordelia Poche, MD [facility] 07/02/2020, 11:23 AM  If 7PM-7AM, please contact night-coverage [email or web address]

## 2020-07-02 NOTE — TOC Progression Note (Signed)
Transition of Care [facility]) - Progression Note    Patient Details  Name: CORT DRAGOO MRN: [id number] Date of Birth: 11/15/1945  [facility] CM/SW Contact  Servando Snare, [place] Phone Number: 07/02/2020, 2:29 PM  Clinical Narrative:   LCSW gave bed offers to patient who deferred to his spouse. Spouse declined current bed offers, in hopes that her preference will have availability. Spouse willing to explore other options if facility does not have beds. Patient will need pre auth.     Expected Discharge Plan: [place] Barriers to Discharge: Continued Medical Work up  Expected Discharge Plan and Services Expected Discharge Plan: [place] In-house Referral: Clinical Social Work   Post Acute Care Choice: [place] Living arrangements for the past 2 months: Single Family Home                 DME Arranged: N/A DME Agency: NA                   Social Determinants of Health (SDOH) Interventions    Readmission Risk Interventions No flowsheet data found.

## 2020-07-02 NOTE — Progress Notes (Signed)
TOC CM spoke to [place], wife and she pick [facility]. Will call admission coordinator, Jerilynn Som with new referral. Left message with request and requested call back to [place] with decision. Wife states that is her only choice at this time. [place], [place] [facility] TOC CM [phone]

## 2020-07-03 MED ORDER — SODIUM CHLORIDE 0.9 % IV SOLN
1.0000 g | INTRAVENOUS | Status: DC
  Filled 2020-07-03: qty 10

## 2020-07-03 MED ORDER — SODIUM CHLORIDE 0.9 % IV SOLN
1.0000 g | Freq: Every day | INTRAVENOUS | Status: AC
  Administered 2020-07-03 – 2020-07-04 (×2): 1 g via INTRAVENOUS
  Filled 2020-07-03: qty 10
  Filled 2020-07-03: qty 1

## 2020-07-03 MED ORDER — SODIUM CHLORIDE 0.9 % IV SOLN
510.0000 mg | Freq: Once | INTRAVENOUS | Status: AC
  Administered 2020-07-03: 510 mg via INTRAVENOUS
  Filled 2020-07-03: qty 510

## 2020-07-03 NOTE — Progress Notes (Addendum)
PROGRESS NOTE    Mark [name]  UKG:[id number] DOB: 09-12-1945 DOA: 06/29/2020 PCP: Harlan Stains, MD   Brief Narrative: Mark [name] is a 75 y.o. 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 Patient presented secondary to multiple falls and weakness with evidence of AKI and possible UTI on admission. Urine cultures with insignificant growth. AKI resolved with IV fluids.   Assessment & Plan:  Active Problems:   AKI (acute kidney injury) ([place])   CKD (chronic kidney disease), stage III ([facility])   Neurogenic bladder   AKI on CKD stage IIIa Likely secondary to poor oral intake. Baseline creatinine of around 1.2-1.3. Creatinine on admission of 1.72. Started on IV fluids. Improvement of creatinine and is back down to baseline -Discontinue IV fluids  UTI Confirmed from outpatient urine culture by urologist. Patient was initially started on Keflex for treatment. In setting of neurogenic bladder. Urinalysis from admission significant for few bacteria and moderate leukocytes. Urine culture obtained and is significant for insignificant growth. Started empirically on Ceftriaxone on admission. -Will give a dose of Ceftriaxone IV today and one more tomorrow prior to discharge  Neurogenic bladder Patient self-catheterizes. Follows with urology as an outpatient. -In-out catheterization q6 hours  Weakness Multiple falls Progressively worsened. Possibly exacerbated by infection. PT recommending SNF.  Leukocytosis Mild. No overt signs of infection. Repeat CBC as an outpatient.  ITP Refractory. Platelet count normal  Iron deficiency anemia Chronic. -Feraheme IV x1  History of CVA Left sided hemiparesis and facial droop. Stable. Has a history of positive lupus anticoagulant and DVT -Continue Xarelto  Primary hypertension -Continue amlodipine  Hyperlipidemia -Continue Crestor  Depression -Continue Wellbutrin XL and  Zoloft  Epilepsy -Continue Keppra  Hydronephrosis Bilateral. Chronic and stable.   DVT prophylaxis: Xarelto Code Status:   Code Status: Full Code Family Communication: Wife on telephone (16 minutes) Disposition Plan: Discharge to SNF when bed is available. Medically stable for discharge.   Consultants:   None  Procedures:   None  Antimicrobials:  Ceftriaxone IV   Subjective: No concerns except for more night time incontinence.  Objective: Vitals:   07/02/20 0535 07/02/20 1426 07/02/20 2118 07/03/20 0544  BP: (!) 151/80 134/77 139/74 129/72  Pulse: 68 67 64 65  Resp: 18 20 18 18   Temp: 99 F (37.2 C) 98.1 F (36.7 C) 98.2 F (36.8 C) 98.2 F (36.8 C)  TempSrc:  Oral    SpO2: 99%  98% 98%  Weight:      Height:        Intake/Output Summary (Last 24 hours) at 07/03/2020 1138 Last data filed at 07/03/2020 1021 Gross per 24 hour  Intake 290 ml  Output 1250 ml  Net -960 ml   Filed Weights   06/29/20 2159  Weight: 85.7 kg    Examination:  General exam: Appears calm and comfortable Respiratory system: Clear to auscultation. Respiratory effort normal. Cardiovascular system: S1 & S2 heard, RRR. No murmurs, rubs, gallops or clicks. Gastrointestinal system: Abdomen is nondistended, soft and nontender. No organomegaly or masses felt. Normal bowel sounds heard. Central nervous system: Alert and oriented. Musculoskeletal: No edema. No calf tenderness Skin: No cyanosis. No rashes Psychiatry: Judgement and insight appear normal. Mood & affect appropriate.   Data Reviewed: I have personally reviewed following labs and imaging studies  CBC Lab Results  Component Value Date   WBC 13.9 (H) 07/02/2020   RBC 3.31 (L) 07/02/2020   HGB 9.7 (L) 07/02/2020  HCT 30.7 (L) 07/02/2020   MCV 92.7 07/02/2020   MCH 29.3 07/02/2020   PLT 399 07/02/2020   MCHC 31.6 07/02/2020   RDW 15.2 07/02/2020   LYMPHSABS 2.3 06/30/2020   MONOABS 2.2 (H) 06/30/2020   EOSABS 0.5  06/30/2020   BASOSABS 0.1 02/58/5277     Last metabolic panel Lab Results  Component Value Date   NA 142 07/02/2020   K 3.7 07/02/2020   CL 111 07/02/2020   CO2 23 07/02/2020   BUN 24 (H) 07/02/2020   CREATININE 1.19 07/02/2020   GLUCOSE 116 (H) 07/02/2020   GFRNONAA >60 07/02/2020   GFRAA >60 06/02/2019   CALCIUM 8.6 (L) 07/02/2020   PHOS 2.5 03/18/2007   PROT 6.5 07/01/2020   ALBUMIN 3.2 (L) 07/01/2020   LABGLOB 3.1 06/05/2016   AGRATIO 1.5 06/05/2016   BILITOT 0.5 07/01/2020   ALKPHOS 50 07/01/2020   AST 55 (H) 07/01/2020   ALT 68 (H) 07/01/2020   ANIONGAP 8 07/02/2020    CBG (last 3)  No results for input(s): GLUCAP in the last 72 hours.   GFR: Estimated Creatinine Clearance: 55.4 mL/min (by C-G formula based on SCr of 1.19 mg/dL).  Coagulation Profile: No results for input(s): INR, PROTIME in the last 168 hours.  Recent Results (from the past 240 hour(s))  Urine culture     Status: Abnormal   Collection Time: 06/29/20 11:10 PM   Specimen: Urine, Catheterized  Result Value Ref Range Status   Specimen Description   Final    URINE, CATHETERIZED Performed at [facility], [address] [place], [place] [address]    Special Requests   Final    NONE Performed at [facility], [address] [place], [place] [address]    Culture (A)  Final    <10,000 COLONIES/mL INSIGNIFICANT GROWTH Performed at [facility], [place] [address], [place], [place] [address]    Report Status 07/01/2020 FINAL  Final  Resp Panel by RT-PCR (Flu A&B, Covid) Nasopharyngeal Swab     Status: None   Collection Time: 06/30/20 12:22 AM   Specimen: Nasopharyngeal Swab; Nasopharyngeal(NP) swabs in vial transport medium  Result Value Ref Range Status   SARS Coronavirus 2 by RT PCR NEGATIVE NEGATIVE Final    Comment: (NOTE) SARS-CoV-2 target nucleic acids are NOT DETECTED.  The SARS-CoV-2 RNA is generally detectable in upper respiratory specimens during the  acute phase of infection. The lowest concentration of SARS-CoV-2 viral copies this assay can detect is 138 copies/mL. A negative result does not preclude SARS-Cov-2 infection and should not be used as the sole basis for treatment or other patient management decisions. A negative result may occur with  improper specimen collection/handling, submission of specimen other than nasopharyngeal swab, presence of viral mutation(s) within the areas targeted by this assay, and inadequate number of viral copies(<138 copies/mL). A negative result must be combined with clinical observations, patient history, and epidemiological information. The expected result is Negative.  Fact Sheet for Patients:  EntrepreneurPulse.com.au  Fact Sheet for Healthcare Providers:  IncredibleEmployment.be  This test is no t yet approved or cleared by the [place] FDA and  has been authorized for detection and/or diagnosis of SARS-CoV-2 by FDA under an Emergency Use Authorization (EUA). This EUA will remain  in effect (meaning this test can be used) for the duration of the COVID-19 declaration under Section 564(b)(1) of the Act, 21 U.S.C.section 360bbb-3(b)(1), unless the authorization is terminated  or revoked  sooner.       Influenza A by PCR NEGATIVE NEGATIVE Final   Influenza B by PCR NEGATIVE NEGATIVE Final    Comment: (NOTE) The Xpert Xpress SARS-CoV-2/FLU/RSV plus assay is intended as an aid in the diagnosis of influenza from Nasopharyngeal swab specimens and should not be used as a sole basis for treatment. Nasal washings and aspirates are unacceptable for Xpert Xpress SARS-CoV-2/FLU/RSV testing.  Fact Sheet for Patients: EntrepreneurPulse.com.au  Fact Sheet for Healthcare Providers: IncredibleEmployment.be  This test is not yet approved or cleared by the [place] FDA and has been authorized for detection and/or  diagnosis of SARS-CoV-2 by FDA under an Emergency Use Authorization (EUA). This EUA will remain in effect (meaning this test can be used) for the duration of the COVID-19 declaration under Section 564(b)(1) of the Act, 21 U.S.C. section 360bbb-3(b)(1), unless the authorization is terminated or revoked.  Performed at [facility], [address] [place], [place] [address]   Culture, blood (routine x 2)     Status: None (Preliminary result)   Collection Time: 06/30/20 12:24 AM   Specimen: BLOOD  Result Value Ref Range Status   Specimen Description   Final    BLOOD Blood Culture adequate volume Performed at [facility], [address] [place], [place] [address]    Special Requests   Final    BOTTLES DRAWN AEROBIC AND ANAEROBIC LEFT ANTECUBITAL Performed at [facility], [address] [place], [place] [address]    Culture   Final    NO GROWTH 3 DAYS Performed at [facility], [place] [address] [place], [place] [address]    Report Status PENDING  Incomplete  Culture, blood (routine x 2)     Status: None (Preliminary result)   Collection Time: 06/30/20 12:43 AM   Specimen: BLOOD  Result Value Ref Range Status   Specimen Description   Final    BLOOD Blood Culture adequate volume Performed at [facility], [address] [place], [place] [address]    Special Requests   Final    BOTTLES DRAWN AEROBIC AND ANAEROBIC RIGHT ANTECUBITAL Performed at [facility], [address] [place], [place] [address]    Culture   Final    NO GROWTH 3 DAYS Performed at [facility], [place] [address], [place], [place] [address]    Report Status PENDING  Incomplete        Radiology Studies: No results found.      Scheduled Meds: . amLODipine  5 mg Oral Daily  . aspirin EC  81 mg Oral Daily  . buPROPion  150 mg Oral Daily  . cholecalciferol  5,000 Units Oral QODAY  . levETIRAcetam  1,500 mg Oral BID  . melatonin  10  mg Oral QHS  . rivaroxaban  20 mg Oral Q supper  . rosuvastatin  20 mg Oral QHS  . sertraline  200 mg Oral q AM   Continuous Infusions: . ferumoxytol       LOS: 2 days     Cordelia Poche, MD [facility] 07/03/2020, 11:38 AM  If 7PM-7AM, please contact night-coverage [email or web address]

## 2020-07-03 NOTE — TOC Progression Note (Signed)
Transition of Care [facility]) - Progression Note    Patient Details  Name: Mark [name] MRN: [id number] Date of Birth: [birth year]  [facility] CM/SW Contact  Lennart Pall, [place] Phone Number: 07/03/2020, 3:16 PM  Clinical Narrative:    Have spoken with pt, wife and daughter today to discuss SNF bed offers.  Presented offers to them, however, the facility they preferred ([facility]) was unable to offer.  Pt and family are aware that the other offers were from SNFs in the [employer] but they have declined them all.  Request made to reach out to [facility] as a privately paid admission.  This was done and the facility did make a bed offer, family has accepted and we will plan for dc tomorrow. MD aware and COVID test has been ordered.  TOC will continue to follow.  Expected Discharge Plan: [place] Barriers to Discharge: Continued Medical Work up  Expected Discharge Plan and Services Expected Discharge Plan: [place] In-house Referral: Clinical Social Work   Post Acute Care Choice: [place] Living arrangements for the past 2 months: Single Family Home                 DME Arranged: N/A DME Agency: NA                   Social Determinants of Health (SDOH) Interventions    Readmission Risk Interventions No flowsheet data found.

## 2020-07-04 DIAGNOSIS — N390 Urinary tract infection, site not specified: Secondary | ICD-10-CM

## 2020-07-04 LAB — SARS CORONAVIRUS 2 (TAT 6-24 HRS): SARS Coronavirus 2: NEGATIVE

## 2020-07-04 NOTE — Discharge Summary (Addendum)
Physician Discharge Summary  [name] LEX:[id number] DOB: June 09, 1945 DOA: 06/29/2020  PCP: Harlan Stains, MD  Admit date: 06/29/2020 Discharge date: 07/04/2020  Admitted From: Home Disposition: SNF  Recommendations for Outpatient Follow-up:  1. Follow up with PCP in 1 week 2. Please obtain BMP/CBC in one week 3. Please follow up on the following pending results: None  Discharge Condition: Stable CODE STATUS: Full code Diet recommendation: Heart healthy   Brief/Interim Summary:  Admission HPI written by Cherylann Ratel, DO   Chief Complaint: weakness, fall  HPI: Mark [name] is a 75 y.o. male with medical history significant of epilepsy, CVA w/ left residuals, chronic ITP. Presenting with generalized weakness, falls, and fever. He's seemed weak for the last 4 days or so. He had a fever noted 4 days. He was given OTC meds, but they didn't help his fevers for long. He has had multiple falls as well. They called their PCP yesterday because they were concerned that he was having another UTI (his symptoms were similar to his last UTI). Antibiotics were called in. However the patient did not feel any better. The family decided that he needed to come to the ED. He denies any other aggravating or alleviating factors.    Hospital course:  AKI on CKD stage IIIa Likely secondary to poor oral intake. Baseline creatinine of around 1.2-1.3. Creatinine on admission of 1.72. Started on IV fluids. Improvement of creatinine and is back down to baseline. AKI resolved.  UTI Confirmed from outpatient urine culture by urologist. Patient was initially started on Keflex for treatment. In setting of neurogenic bladder. Urinalysis from admission significant for few bacteria and moderate leukocytes. Urine culture obtained and is significant for insignificant growth. Started empirically on Ceftriaxone on admission. Completed 5 doses of Ceftriaxone IV prior to discharge  Neurogenic  bladder Patient self-catheterizes. Follows with urology as an outpatient. Continue in-out catheterizations.  Weakness Multiple falls Progressively worsened. Possibly exacerbated by infection. PT recommending SNF.  Leukocytosis Mild. No overt signs of infection. Repeat CBC as an outpatient.  ITP Refractory. Platelet count normal.  Iron deficiency anemia Chronic. Given Feraheme IV prior to discharge.  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 Stable. Has a history of positive lupus anticoagulant and DVT. Continue Xarelto.  Primary hypertension Continue amlodipine  Hyperlipidemia Continue Crestor.  Depression Continue Wellbutrin XL and Zoloft.  Epilepsy Continue Keppra.  Hydronephrosis Bilateral. Chronic and stable. Follow-up with urology.  Discharge Diagnoses:  Principal Problem:   AKI (acute kidney injury) ([place]) Active Problems:   CKD (chronic kidney disease), stage III (HCC)   Neurogenic bladder   Acute lower UTI    Discharge Instructions   Allergies as of 07/04/2020      Reactions   Diphenhydramine    Other reaction(s): affects kidney   Famotidine    Other reaction(s): affects kidneys      Medication List    STOP taking these medications   HYDROcodone-acetaminophen 10-325 MG tablet Commonly known as: [place] these medications   acetaminophen 325 MG tablet Commonly known as: TYLENOL Take 650 mg by mouth every morning.   amLODipine 5 MG tablet Commonly known as: NORVASC Take 5 mg by mouth daily.   aspirin EC 81 MG tablet Take 81 mg by mouth daily. Swallow whole.   buPROPion 150 MG 24 hr tablet Commonly known as: WELLBUTRIN XL Take 150 mg by mouth daily.   Crestor 20 MG tablet Generic drug: rosuvastatin Take 20 mg  by mouth at bedtime.   dexamethasone 4 MG tablet Commonly known as: DECADRON TAKE 10 TABLETS BY MOUTH ONCE DAILY AS NEEDED FOR 3 DAYS FOR  ITP What changed:   how much to take  how to take  this  when to take this  additional instructions   fluticasone 50 MCG/ACT nasal spray Commonly known as: FLONASE Place 2 sprays into both nostrils daily as needed for allergies or rhinitis.   levETIRAcetam 500 MG tablet Commonly known as: KEPPRA Take 1,500 mg by mouth 2 (two) times daily.   Melatonin 10 MG Tabs Take 10 mg by mouth at bedtime.   omeprazole 40 MG capsule Commonly known as: PRILOSEC Take 40 mg by mouth every morning.   polyethylene glycol 17 g packet Commonly known as: MIRALAX / GLYCOLAX Take 17 g by mouth daily as needed. What changed: reasons to take this Notes to patient: Last bowel movement 03/21   sertraline 100 MG tablet Commonly known as: ZOLOFT Take 200 mg by mouth in the morning.   Vitamin D3 125 MCG (5000 UT) Tabs Take 5,000 Units by mouth every other day.   Xarelto 20 MG Tabs tablet Generic drug: rivaroxaban Take 1 tablet by mouth once daily with breakfast What changed: how much to take       Contact information for follow-up providers    Harlan Stains, MD Follow up in 2 week(s).   Specialty: Family Medicine Why: Hospital follow-up Contact information: [address], [address] [place] [address] [phone]            Contact information for after-discharge care    Destination    [facility] Preferred SNF .   Service: Skilled Nursing Contact information: [place] [phone]                 Allergies  Allergen Reactions  . Diphenhydramine     Other reaction(s): affects kidney  . Famotidine     Other reaction(s): affects kidneys    Consultations:  None   Procedures/Studies: US RENAL  Result Date: 06/30/2020 CLINICAL DATA:  Acute kidney injury. EXAM: RENAL / URINARY TRACT ULTRASOUND COMPLETE COMPARISON:  January 13, 2020. FINDINGS: Right Kidney: Renal measurements: 14.2 x 6.8 x 6.5 cm = volume: 328 mL. Echogenicity within normal  limits. Moderate hydronephrosis is again noted. 8 mm simple cyst is seen in midpole. Left Kidney: Renal measurements: 13.7 x 6.7 x 6.5 cm = volume: 314 mL. Echogenicity within normal limits. Moderate hydronephrosis is again noted. No mass is noted. Bladder: Appears normal for degree of bladder distention. Right ureteral jet is visualized. Other: None. IMPRESSION: Moderate bilateral hydronephrosis is again noted. Electronically Signed   By: Marijo Conception M.D.   On: 06/30/2020 09:47   DG Chest Port 1 View  Result Date: 06/30/2020 CLINICAL DATA:  Fever and weakness EXAM: PORTABLE CHEST 1 VIEW COMPARISON:  02/04/2017, CT chest 10/29/2019 FINDINGS: No focal opacity or pleural effusion. Normal cardiomediastinal silhouette with aortic atherosclerosis. No pneumothorax. Chronic appearing left fourth rib fracture. Multiple chronic bilateral mid to lower rib fractures IMPRESSION: No active disease. Electronically Signed   By: Donavan Foil M.D.   On: 06/30/2020 00:11      Subjective: No concerns overnight.  Discharge Exam: Vitals:   07/03/20 2213 07/04/20 0641  BP: (!) 156/79 (!) 158/79  Pulse: 66 64  Resp:  16  Temp:  98.3 F (36.8 C)  SpO2: 95% 96%   Vitals:  07/03/20 0544 07/03/20 1238 07/03/20 2213 07/04/20 0641  BP: 129/72 135/66 (!) 156/79 (!) 158/79  Pulse: 65 65 66 64  Resp: 18 18  16   Temp: 98.2 F (36.8 C) 98.8 F (37.1 C)  98.3 F (36.8 C)  TempSrc:  Oral  Oral  SpO2: 98% 100% 95% 96%  Weight:      Height:        General: Pt is alert, awake, not in acute distress Cardiovascular: RRR, S1/S2 +, no rubs, no gallops Respiratory: CTA bilaterally, no wheezing, no rhonchi Abdominal: Soft, NT, ND, bowel sounds + Extremities: no edema, no cyanosis    The results of significant diagnostics from this hospitalization (including imaging, microbiology, ancillary and laboratory) are listed below for reference.     Microbiology: Recent Results (from the past 240 hour(s))  Urine  culture     Status: Abnormal   Collection Time: 06/29/20 11:10 PM   Specimen: Urine, Catheterized  Result Value Ref Range Status   Specimen Description   Final    URINE, CATHETERIZED Performed at [facility], [address] [place], [place] [address]    Special Requests   Final    NONE Performed at [facility], [address] [place], [place] [address]    Culture (A)  Final    <10,000 COLONIES/mL INSIGNIFICANT GROWTH Performed at [facility], [place] [address], [place], [place] [address]    Report Status 07/01/2020 FINAL  Final  Resp Panel by RT-PCR (Flu A&B, Covid) Nasopharyngeal Swab     Status: None   Collection Time: 06/30/20 12:22 AM   Specimen: Nasopharyngeal Swab; Nasopharyngeal(NP) swabs in vial transport medium  Result Value Ref Range Status   SARS Coronavirus 2 by RT PCR NEGATIVE NEGATIVE Final    Comment: (NOTE) SARS-CoV-2 target nucleic acids are NOT DETECTED.  The SARS-CoV-2 RNA is generally detectable in upper respiratory specimens during the acute phase of infection. The lowest concentration of SARS-CoV-2 viral copies this assay can detect is 138 copies/mL. A negative result does not preclude SARS-Cov-2 infection and should not be used as the sole basis for treatment or other patient management decisions. A negative result may occur with  improper specimen collection/handling, submission of specimen other than nasopharyngeal swab, presence of viral mutation(s) within the areas targeted by this assay, and inadequate number of viral copies(<138 copies/mL). A negative result must be combined with clinical observations, patient history, and epidemiological information. The expected result is Negative.  Fact Sheet for Patients:  EntrepreneurPulse.com.au  Fact Sheet for Healthcare Providers:  IncredibleEmployment.be  This test is no t yet approved or cleared by the [place] FDA and  has been  authorized for detection and/or diagnosis of SARS-CoV-2 by FDA under an Emergency Use Authorization (EUA). This EUA will remain  in effect (meaning this test can be used) for the duration of the COVID-19 declaration under Section 564(b)(1) of the Act, 21 U.S.C.section 360bbb-3(b)(1), unless the authorization is terminated  or revoked sooner.       Influenza A by PCR NEGATIVE NEGATIVE Final   Influenza B by PCR NEGATIVE NEGATIVE Final    Comment: (NOTE) The Xpert Xpress SARS-CoV-2/FLU/RSV plus assay is intended as an aid in the diagnosis of influenza from Nasopharyngeal swab specimens and should not be used as a sole basis for treatment. Nasal washings and aspirates are unacceptable for Xpert Xpress SARS-CoV-2/FLU/RSV testing.  Fact Sheet for Patients: EntrepreneurPulse.com.au  Fact Sheet for Healthcare Providers: IncredibleEmployment.be  This  test is not yet approved or cleared by the [place] and has been authorized for detection and/or diagnosis of SARS-CoV-2 by FDA under an Emergency Use Authorization (EUA). This EUA will remain in effect (meaning this test can be used) for the duration of the COVID-19 declaration under Section 564(b)(1) of the Act, 21 U.S.C. section 360bbb-3(b)(1), unless the authorization is terminated or revoked.  Performed at [facility], [address] [place], [place] [address]   Culture, blood (routine x 2)     Status: None (Preliminary result)   Collection Time: 06/30/20 12:24 AM   Specimen: BLOOD  Result Value Ref Range Status   Specimen Description   Final    BLOOD Blood Culture adequate volume Performed at [facility], [address] [place], [place] [address]    Special Requests   Final    BOTTLES DRAWN AEROBIC AND ANAEROBIC LEFT ANTECUBITAL Performed at [facility], [address] [place], [place] [address]    Culture   Final    NO GROWTH 4  DAYS Performed at [facility], [place] [address], [place], [place] [address]    Report Status PENDING  Incomplete  Culture, blood (routine x 2)     Status: None (Preliminary result)   Collection Time: 06/30/20 12:43 AM   Specimen: BLOOD  Result Value Ref Range Status   Specimen Description   Final    BLOOD Blood Culture adequate volume Performed at [facility], [address] [place], [place] [address]    Special Requests   Final    BOTTLES DRAWN AEROBIC AND ANAEROBIC RIGHT ANTECUBITAL Performed at [facility], [address] [place], [place] [address]    Culture   Final    NO GROWTH 4 DAYS Performed at [facility], [place] [address] [place], [place] [address]    Report Status PENDING  Incomplete  SARS CORONAVIRUS 2 (TAT 6-24 HRS) Nasopharyngeal Nasopharyngeal Swab     Status: None   Collection Time: 07/03/20  3:45 PM   Specimen: Nasopharyngeal Swab  Result Value Ref Range Status   SARS Coronavirus 2 NEGATIVE NEGATIVE Final    Comment: (NOTE) SARS-CoV-2 target nucleic acids are NOT DETECTED.  The SARS-CoV-2 RNA is generally detectable in upper and lower respiratory specimens during the acute phase of infection. Negative results do not preclude SARS-CoV-2 infection, do not rule out co-infections with other pathogens, and should not be used as the sole basis for treatment or other patient management decisions. Negative results must be combined with clinical observations, patient history, and epidemiological information. The expected result is Negative.  Fact Sheet for Patients: SugarRoll.be  Fact Sheet for Healthcare Providers: https://www.woods-mathews.com/  This test is not yet approved or cleared by the [place] FDA and  has been authorized for detection and/or diagnosis of SARS-CoV-2 by FDA under an Emergency Use Authorization (EUA). This EUA will remain  in effect (meaning this test can  be used) for the duration of the COVID-19 declaration under Se ction 564(b)(1) of the Act, 21 U.S.C. section 360bbb-3(b)(1), unless the authorization is terminated or revoked sooner.  Performed at [facility], [place] [address], [place], [place] [address]      Labs: BNP (last 3 results) No results for input(s): BNP in the last 8760 hours. Basic Metabolic Panel: Recent Labs  Lab 06/30/20 0022 07/01/20 0315 07/02/20 0234  NA 135 141 142  K  3.6 3.9 3.7  CL 103 111 111  CO2 21* 21* 23  GLUCOSE 105* 111* 116*  BUN 57* 41* 24*  CREATININE 1.72* 1.43* 1.19  CALCIUM 8.8* 8.6* 8.6*   Liver Function Tests: Recent Labs  Lab 06/30/20 0022 07/01/20 0315  AST 35 55*  ALT 43 68*  ALKPHOS 57 50  BILITOT 0.6 0.5  PROT 7.0 6.5  ALBUMIN 3.6 3.2*   No results for input(s): LIPASE, AMYLASE in the last 168 hours. No results for input(s): AMMONIA in the last 168 hours. CBC: Recent Labs  Lab 06/30/20 0022 07/01/20 0315 07/02/20 0234  WBC 13.8* 14.2* 13.9*  NEUTROABS 8.6*  --   --   HGB 10.1* 9.4* 9.7*  HCT 30.1* 29.6* 30.7*  MCV 88.5 93.1 92.7  PLT 274 335 399   Cardiac Enzymes: No results for input(s): CKTOTAL, CKMB, CKMBINDEX, TROPONINI in the last 168 hours. BNP: Invalid input(s): POCBNP CBG: No results for input(s): GLUCAP in the last 168 hours. D-Dimer No results for input(s): DDIMER in the last 72 hours. Hgb A1c No results for input(s): HGBA1C in the last 72 hours. Lipid Profile No results for input(s): CHOL, HDL, LDLCALC, TRIG, CHOLHDL, LDLDIRECT in the last 72 hours. Thyroid function studies No results for input(s): TSH, T4TOTAL, T3FREE, THYROIDAB in the last 72 hours.  Invalid input(s): FREET3 Anemia work up No results for input(s): VITAMINB12, FOLATE, FERRITIN, TIBC, IRON, RETICCTPCT in the last 72 hours. Urinalysis    Component Value Date/Time   COLORURINE YELLOW 06/29/2020 2310   APPEARANCEUR HAZY (A) 06/29/2020 2310   LABSPEC 1.010 06/29/2020  2310   PHURINE 6.0 06/29/2020 2310   GLUCOSEU NEGATIVE 06/29/2020 2310   HGBUR SMALL (A) 06/29/2020 2310   BILIRUBINUR NEGATIVE 06/29/2020 2310   KETONESUR NEGATIVE 06/29/2020 2310   PROTEINUR NEGATIVE 06/29/2020 2310   UROBILINOGEN 1.0 04/04/2010 0110   NITRITE NEGATIVE 06/29/2020 2310   LEUKOCYTESUR MODERATE (A) 06/29/2020 2310   Sepsis Labs Invalid input(s): PROCALCITONIN,  WBC,  LACTICIDVEN Microbiology Recent Results (from the past 240 hour(s))  Urine culture     Status: Abnormal   Collection Time: 06/29/20 11:10 PM   Specimen: Urine, Catheterized  Result Value Ref Range Status   Specimen Description   Final    URINE, CATHETERIZED Performed at [facility], [address] [place], [place] [address]    Special Requests   Final    NONE Performed at [facility], [address] [place], [place] [address]    Culture (A)  Final    <10,000 COLONIES/mL INSIGNIFICANT GROWTH Performed at [facility], [place] [address] [place], [place] [address]    Report Status 07/01/2020 FINAL  Final  Resp Panel by RT-PCR (Flu A&B, Covid) Nasopharyngeal Swab     Status: None   Collection Time: 06/30/20 12:22 AM   Specimen: Nasopharyngeal Swab; Nasopharyngeal(NP) swabs in vial transport medium  Result Value Ref Range Status   SARS Coronavirus 2 by RT PCR NEGATIVE NEGATIVE Final    Comment: (NOTE) SARS-CoV-2 target nucleic acids are NOT DETECTED.  The SARS-CoV-2 RNA is generally detectable in upper respiratory specimens during the acute phase of infection. The lowest concentration of SARS-CoV-2 viral copies this assay can detect is 138 copies/mL. A negative result does not preclude SARS-Cov-2 infection and should not be used as the sole basis for treatment or other patient management decisions. A negative result may occur with  improper specimen collection/handling, submission of specimen other than nasopharyngeal swab,  presence of viral mutation(s) within  the areas targeted by this assay, and inadequate number of viral copies(<138 copies/mL). A negative result must be combined with clinical observations, patient history, and epidemiological information. The expected result is Negative.  Fact Sheet for Patients:  EntrepreneurPulse.com.au  Fact Sheet for Healthcare Providers:  IncredibleEmployment.be  This test is no t yet approved or cleared by the [place] FDA and  has been authorized for detection and/or diagnosis of SARS-CoV-2 by FDA under an Emergency Use Authorization (EUA). This EUA will remain  in effect (meaning this test can be used) for the duration of the COVID-19 declaration under Section 564(b)(1) of the Act, 21 U.S.C.section 360bbb-3(b)(1), unless the authorization is terminated  or revoked sooner.       Influenza A by PCR NEGATIVE NEGATIVE Final   Influenza B by PCR NEGATIVE NEGATIVE Final    Comment: (NOTE) The Xpert Xpress SARS-CoV-2/FLU/RSV plus assay is intended as an aid in the diagnosis of influenza from Nasopharyngeal swab specimens and should not be used as a sole basis for treatment. Nasal washings and aspirates are unacceptable for Xpert Xpress SARS-CoV-2/FLU/RSV testing.  Fact Sheet for Patients: EntrepreneurPulse.com.au  Fact Sheet for Healthcare Providers: IncredibleEmployment.be  This test is not yet approved or cleared by the [place] FDA and has been authorized for detection and/or diagnosis of SARS-CoV-2 by FDA under an Emergency Use Authorization (EUA). This EUA will remain in effect (meaning this test can be used) for the duration of the COVID-19 declaration under Section 564(b)(1) of the Act, 21 U.S.C. section 360bbb-3(b)(1), unless the authorization is terminated or revoked.  Performed at [facility], [address] [place], [place] [address]   Culture, blood (routine x 2)     Status: None  (Preliminary result)   Collection Time: 06/30/20 12:24 AM   Specimen: BLOOD  Result Value Ref Range Status   Specimen Description   Final    BLOOD Blood Culture adequate volume Performed at [facility], [address] [place], [place] [address]    Special Requests   Final    BOTTLES DRAWN AEROBIC AND ANAEROBIC LEFT ANTECUBITAL Performed at [facility], [address] [place], [place] [address]    Culture   Final    NO GROWTH 4 DAYS Performed at [facility], [place] [address] [place], [place] [address]    Report Status PENDING  Incomplete  Culture, blood (routine x 2)     Status: None (Preliminary result)   Collection Time: 06/30/20 12:43 AM   Specimen: BLOOD  Result Value Ref Range Status   Specimen Description   Final    BLOOD Blood Culture adequate volume Performed at [facility], [address] [place], [place] [address]    Special Requests   Final    BOTTLES DRAWN AEROBIC AND ANAEROBIC RIGHT ANTECUBITAL Performed at [facility], [address] [place], [place] [address]    Culture   Final    NO GROWTH 4 DAYS Performed at [facility], [place] [address], [place], [place] [address]    Report Status PENDING  Incomplete  SARS CORONAVIRUS 2 (TAT 6-24 HRS) Nasopharyngeal Nasopharyngeal Swab     Status: None   Collection Time: 07/03/20  3:45 PM   Specimen: Nasopharyngeal Swab  Result Value Ref Range Status   SARS Coronavirus 2 NEGATIVE NEGATIVE Final    Comment: (NOTE) SARS-CoV-2 target nucleic acids are NOT  DETECTED.  The SARS-CoV-2 RNA is generally detectable in upper and lower respiratory specimens during the acute phase of infection. Negative results do not preclude SARS-CoV-2 infection, do not rule out co-infections with other pathogens, and should not be used as the sole basis for treatment or other patient management decisions. Negative results must be combined with clinical observations, patient history,  and epidemiological information. The expected result is Negative.  Fact Sheet for Patients: SugarRoll.be  Fact Sheet for Healthcare Providers: https://www.woods-mathews.com/  This test is not yet approved or cleared by the [place] FDA and  has been authorized for detection and/or diagnosis of SARS-CoV-2 by FDA under an Emergency Use Authorization (EUA). This EUA will remain  in effect (meaning this test can be used) for the duration of the COVID-19 declaration under Se ction 564(b)(1) of the Act, 21 U.S.C. section 360bbb-3(b)(1), unless the authorization is terminated or revoked sooner.  Performed at [facility], [place] [address] [place], [place] [address]      Time coordinating discharge: 35 minutes  SIGNED:   Cordelia Poche, MD [facility] 07/04/2020, 9:42 AM

## 2020-07-04 NOTE — Discharge Instructions (Signed)
Mark [name],  You are in the hospital with a urinary tract infection in addition to some kidney injury.  You were treated with IV antibiotics in addition to IV fluids and have improved.  Plan for you to go to rehab prior to returning home.

## 2020-07-04 NOTE — Care Management Important Message (Signed)
Important Message  Patient Details IM Letter given to the Patient. Name: Mark [name] MRN: [id number] Date of Birth: Sep 26, 1945   Medicare Important Message Given:  Yes     Kerin Salen 07/04/2020, 1:57 PM

## 2020-07-04 NOTE — TOC Transition Note (Signed)
Transition of Care [facility]) - CM/SW Discharge Note   Patient Details  Name: GREYCEN FELTER MRN: [id number] Date of Birth: May 08, 1945  [facility] CM/SW Contact:  Lennart Pall, LCSW Phone Number: 07/04/2020, 12:22 PM   Clinical Narrative:    Pt medically ready for dc to SNF / [facility] today.  PTAR called.  Pt/family and team aware.  No further TOC needs.   Final next level of care: Skilled Nursing Facility Barriers to Discharge: Barriers Resolved   Patient Goals and CMS Choice Patient states their goals for this hospitalization and ongoing recovery are:: short term rehab and then home   Choice offered to / list presented to : Patient  Discharge Placement              Patient chooses bed at: [facility], [place] Patient to be transferred to facility by: [facility] Name of family member notified: daughter Patient and family notified of of transfer: 07/04/20  Discharge Plan and Services In-house Referral: Clinical Social Work   Post Acute Care Choice: [facility]          DME Arranged: N/A DME Agency: NA                  Social Determinants of Health (SDOH) Interventions     Readmission Risk Interventions No flowsheet data found.

## 2020-07-05 DIAGNOSIS — Z7401 Bed confinement status: Secondary | ICD-10-CM | POA: Diagnosis not present

## 2020-07-05 DIAGNOSIS — R5381 Other malaise: Secondary | ICD-10-CM | POA: Diagnosis not present

## 2020-07-05 DIAGNOSIS — Z743 Need for continuous supervision: Secondary | ICD-10-CM | POA: Diagnosis not present

## 2020-07-05 DIAGNOSIS — M2550 Pain in unspecified joint: Secondary | ICD-10-CM | POA: Diagnosis not present

## 2020-07-05 LAB — CULTURE, BLOOD (ROUTINE X 2)
Culture: NO GROWTH
Culture: NO GROWTH
Specimen Description: ADEQUATE
Specimen Description: ADEQUATE

## 2020-07-05 NOTE — Progress Notes (Signed)
Patient was seen and examined at bedside.  No interval changes compared to yesterday.  No nausea vomiting fever chills or rigor.   Vitals with BMI 07/05/2020 07/04/2020 07/04/2020  Height - - -  Weight - - -  BMI - - -  Systolic 175 301 040  Diastolic 69 88 66  Pulse 64 64 65   Discharge instructions were made yesterday for discharge but due to lack of transportation patient is still in the hospital.  No changes made in the discharge instructions. Please refer to the instructions made on 07/04/2020

## 2020-07-05 NOTE — Progress Notes (Signed)
PTAR arrived to the unit. Placed call to [facility] and spoke with Jenny Reichmann RN who informed me that [facility] will not accept admissions after 9 PM. Patient remains on the unit.

## 2020-07-05 NOTE — Progress Notes (Signed)
Placed call to [facility] and gave report to Cam Hai.

## 2020-07-06 DIAGNOSIS — I639 Cerebral infarction, unspecified: Secondary | ICD-10-CM | POA: Diagnosis not present

## 2020-07-06 DIAGNOSIS — Z9181 History of falling: Secondary | ICD-10-CM | POA: Diagnosis not present

## 2020-07-06 DIAGNOSIS — R2681 Unsteadiness on feet: Secondary | ICD-10-CM | POA: Diagnosis not present

## 2020-07-06 DIAGNOSIS — R1312 Dysphagia, oropharyngeal phase: Secondary | ICD-10-CM | POA: Diagnosis not present

## 2020-07-06 DIAGNOSIS — N390 Urinary tract infection, site not specified: Secondary | ICD-10-CM | POA: Diagnosis not present

## 2020-07-06 DIAGNOSIS — N179 Acute kidney failure, unspecified: Secondary | ICD-10-CM | POA: Diagnosis not present

## 2020-07-06 DIAGNOSIS — R278 Other lack of coordination: Secondary | ICD-10-CM | POA: Diagnosis not present

## 2020-07-06 DIAGNOSIS — M6281 Muscle weakness (generalized): Secondary | ICD-10-CM | POA: Diagnosis not present

## 2020-07-06 DIAGNOSIS — Z79899 Other long term (current) drug therapy: Secondary | ICD-10-CM | POA: Diagnosis not present

## 2020-07-07 DIAGNOSIS — N179 Acute kidney failure, unspecified: Secondary | ICD-10-CM | POA: Diagnosis not present

## 2020-07-07 DIAGNOSIS — Z9181 History of falling: Secondary | ICD-10-CM | POA: Diagnosis not present

## 2020-07-07 DIAGNOSIS — R1312 Dysphagia, oropharyngeal phase: Secondary | ICD-10-CM | POA: Diagnosis not present

## 2020-07-07 DIAGNOSIS — R278 Other lack of coordination: Secondary | ICD-10-CM | POA: Diagnosis not present

## 2020-07-07 DIAGNOSIS — N390 Urinary tract infection, site not specified: Secondary | ICD-10-CM | POA: Diagnosis not present

## 2020-07-07 DIAGNOSIS — M6281 Muscle weakness (generalized): Secondary | ICD-10-CM | POA: Diagnosis not present

## 2020-07-07 DIAGNOSIS — R2681 Unsteadiness on feet: Secondary | ICD-10-CM | POA: Diagnosis not present

## 2020-07-07 DIAGNOSIS — I639 Cerebral infarction, unspecified: Secondary | ICD-10-CM | POA: Diagnosis not present

## 2020-07-09 DIAGNOSIS — R2681 Unsteadiness on feet: Secondary | ICD-10-CM | POA: Diagnosis not present

## 2020-07-09 DIAGNOSIS — N319 Neuromuscular dysfunction of bladder, unspecified: Secondary | ICD-10-CM | POA: Diagnosis not present

## 2020-07-09 DIAGNOSIS — R69 Illness, unspecified: Secondary | ICD-10-CM | POA: Diagnosis not present

## 2020-07-09 DIAGNOSIS — N390 Urinary tract infection, site not specified: Secondary | ICD-10-CM | POA: Diagnosis not present

## 2020-07-10 DIAGNOSIS — N179 Acute kidney failure, unspecified: Secondary | ICD-10-CM | POA: Diagnosis not present

## 2020-07-10 DIAGNOSIS — R278 Other lack of coordination: Secondary | ICD-10-CM | POA: Diagnosis not present

## 2020-07-10 DIAGNOSIS — R1312 Dysphagia, oropharyngeal phase: Secondary | ICD-10-CM | POA: Diagnosis not present

## 2020-07-10 DIAGNOSIS — Z9181 History of falling: Secondary | ICD-10-CM | POA: Diagnosis not present

## 2020-07-10 DIAGNOSIS — R2681 Unsteadiness on feet: Secondary | ICD-10-CM | POA: Diagnosis not present

## 2020-07-10 DIAGNOSIS — M6281 Muscle weakness (generalized): Secondary | ICD-10-CM | POA: Diagnosis not present

## 2020-07-10 DIAGNOSIS — I639 Cerebral infarction, unspecified: Secondary | ICD-10-CM | POA: Diagnosis not present

## 2020-07-10 DIAGNOSIS — N390 Urinary tract infection, site not specified: Secondary | ICD-10-CM | POA: Diagnosis not present

## 2020-07-11 DIAGNOSIS — R1312 Dysphagia, oropharyngeal phase: Secondary | ICD-10-CM | POA: Diagnosis not present

## 2020-07-11 DIAGNOSIS — R278 Other lack of coordination: Secondary | ICD-10-CM | POA: Diagnosis not present

## 2020-07-11 DIAGNOSIS — M6281 Muscle weakness (generalized): Secondary | ICD-10-CM | POA: Diagnosis not present

## 2020-07-11 DIAGNOSIS — I639 Cerebral infarction, unspecified: Secondary | ICD-10-CM | POA: Diagnosis not present

## 2020-07-11 DIAGNOSIS — N390 Urinary tract infection, site not specified: Secondary | ICD-10-CM | POA: Diagnosis not present

## 2020-07-11 DIAGNOSIS — Z9181 History of falling: Secondary | ICD-10-CM | POA: Diagnosis not present

## 2020-07-11 DIAGNOSIS — Z79899 Other long term (current) drug therapy: Secondary | ICD-10-CM | POA: Diagnosis not present

## 2020-07-11 DIAGNOSIS — N179 Acute kidney failure, unspecified: Secondary | ICD-10-CM | POA: Diagnosis not present

## 2020-07-11 DIAGNOSIS — R2681 Unsteadiness on feet: Secondary | ICD-10-CM | POA: Diagnosis not present

## 2020-07-12 DIAGNOSIS — I639 Cerebral infarction, unspecified: Secondary | ICD-10-CM | POA: Diagnosis not present

## 2020-07-12 DIAGNOSIS — Z9181 History of falling: Secondary | ICD-10-CM | POA: Diagnosis not present

## 2020-07-12 DIAGNOSIS — R278 Other lack of coordination: Secondary | ICD-10-CM | POA: Diagnosis not present

## 2020-07-12 DIAGNOSIS — N179 Acute kidney failure, unspecified: Secondary | ICD-10-CM | POA: Diagnosis not present

## 2020-07-12 DIAGNOSIS — N390 Urinary tract infection, site not specified: Secondary | ICD-10-CM | POA: Diagnosis not present

## 2020-07-12 DIAGNOSIS — M6281 Muscle weakness (generalized): Secondary | ICD-10-CM | POA: Diagnosis not present

## 2020-07-12 DIAGNOSIS — R2681 Unsteadiness on feet: Secondary | ICD-10-CM | POA: Diagnosis not present

## 2020-07-12 DIAGNOSIS — R1312 Dysphagia, oropharyngeal phase: Secondary | ICD-10-CM | POA: Diagnosis not present

## 2020-07-13 DIAGNOSIS — I639 Cerebral infarction, unspecified: Secondary | ICD-10-CM | POA: Diagnosis not present

## 2020-07-13 DIAGNOSIS — M6281 Muscle weakness (generalized): Secondary | ICD-10-CM | POA: Diagnosis not present

## 2020-07-13 DIAGNOSIS — R2681 Unsteadiness on feet: Secondary | ICD-10-CM | POA: Diagnosis not present

## 2020-07-13 DIAGNOSIS — N390 Urinary tract infection, site not specified: Secondary | ICD-10-CM | POA: Diagnosis not present

## 2020-07-13 DIAGNOSIS — Z9181 History of falling: Secondary | ICD-10-CM | POA: Diagnosis not present

## 2020-07-13 DIAGNOSIS — R278 Other lack of coordination: Secondary | ICD-10-CM | POA: Diagnosis not present

## 2020-07-13 DIAGNOSIS — R1312 Dysphagia, oropharyngeal phase: Secondary | ICD-10-CM | POA: Diagnosis not present

## 2020-07-13 DIAGNOSIS — N179 Acute kidney failure, unspecified: Secondary | ICD-10-CM | POA: Diagnosis not present

## 2020-07-14 DIAGNOSIS — N179 Acute kidney failure, unspecified: Secondary | ICD-10-CM | POA: Diagnosis not present

## 2020-07-14 DIAGNOSIS — R278 Other lack of coordination: Secondary | ICD-10-CM | POA: Diagnosis not present

## 2020-07-14 DIAGNOSIS — N390 Urinary tract infection, site not specified: Secondary | ICD-10-CM | POA: Diagnosis not present

## 2020-07-14 DIAGNOSIS — R2681 Unsteadiness on feet: Secondary | ICD-10-CM | POA: Diagnosis not present

## 2020-07-14 DIAGNOSIS — M6281 Muscle weakness (generalized): Secondary | ICD-10-CM | POA: Diagnosis not present

## 2020-07-14 DIAGNOSIS — Z9181 History of falling: Secondary | ICD-10-CM | POA: Diagnosis not present

## 2020-07-17 DIAGNOSIS — N179 Acute kidney failure, unspecified: Secondary | ICD-10-CM | POA: Diagnosis not present

## 2020-07-17 DIAGNOSIS — M6281 Muscle weakness (generalized): Secondary | ICD-10-CM | POA: Diagnosis not present

## 2020-07-17 DIAGNOSIS — R278 Other lack of coordination: Secondary | ICD-10-CM | POA: Diagnosis not present

## 2020-07-17 DIAGNOSIS — R2681 Unsteadiness on feet: Secondary | ICD-10-CM | POA: Diagnosis not present

## 2020-07-17 DIAGNOSIS — Z9181 History of falling: Secondary | ICD-10-CM | POA: Diagnosis not present

## 2020-07-17 DIAGNOSIS — N390 Urinary tract infection, site not specified: Secondary | ICD-10-CM | POA: Diagnosis not present

## 2020-07-18 DIAGNOSIS — N179 Acute kidney failure, unspecified: Secondary | ICD-10-CM | POA: Diagnosis not present

## 2020-07-18 DIAGNOSIS — R278 Other lack of coordination: Secondary | ICD-10-CM | POA: Diagnosis not present

## 2020-07-18 DIAGNOSIS — N390 Urinary tract infection, site not specified: Secondary | ICD-10-CM | POA: Diagnosis not present

## 2020-07-18 DIAGNOSIS — M6281 Muscle weakness (generalized): Secondary | ICD-10-CM | POA: Diagnosis not present

## 2020-07-18 DIAGNOSIS — Z9181 History of falling: Secondary | ICD-10-CM | POA: Diagnosis not present

## 2020-07-18 DIAGNOSIS — R2681 Unsteadiness on feet: Secondary | ICD-10-CM | POA: Diagnosis not present

## 2020-07-19 DIAGNOSIS — N179 Acute kidney failure, unspecified: Secondary | ICD-10-CM | POA: Diagnosis not present

## 2020-07-19 DIAGNOSIS — R278 Other lack of coordination: Secondary | ICD-10-CM | POA: Diagnosis not present

## 2020-07-19 DIAGNOSIS — N390 Urinary tract infection, site not specified: Secondary | ICD-10-CM | POA: Diagnosis not present

## 2020-07-19 DIAGNOSIS — Z9181 History of falling: Secondary | ICD-10-CM | POA: Diagnosis not present

## 2020-07-19 DIAGNOSIS — R2681 Unsteadiness on feet: Secondary | ICD-10-CM | POA: Diagnosis not present

## 2020-07-19 DIAGNOSIS — M6281 Muscle weakness (generalized): Secondary | ICD-10-CM | POA: Diagnosis not present

## 2020-07-20 DIAGNOSIS — M6281 Muscle weakness (generalized): Secondary | ICD-10-CM | POA: Diagnosis not present

## 2020-07-20 DIAGNOSIS — N179 Acute kidney failure, unspecified: Secondary | ICD-10-CM | POA: Diagnosis not present

## 2020-07-20 DIAGNOSIS — R278 Other lack of coordination: Secondary | ICD-10-CM | POA: Diagnosis not present

## 2020-07-20 DIAGNOSIS — N390 Urinary tract infection, site not specified: Secondary | ICD-10-CM | POA: Diagnosis not present

## 2020-07-20 DIAGNOSIS — R2681 Unsteadiness on feet: Secondary | ICD-10-CM | POA: Diagnosis not present

## 2020-07-20 DIAGNOSIS — Z79899 Other long term (current) drug therapy: Secondary | ICD-10-CM | POA: Diagnosis not present

## 2020-07-20 DIAGNOSIS — Z9181 History of falling: Secondary | ICD-10-CM | POA: Diagnosis not present

## 2020-08-01 DIAGNOSIS — I69354 Hemiplegia and hemiparesis following cerebral infarction affecting left non-dominant side: Secondary | ICD-10-CM | POA: Diagnosis not present

## 2020-08-01 DIAGNOSIS — I69392 Facial weakness following cerebral infarction: Secondary | ICD-10-CM | POA: Diagnosis not present

## 2020-08-01 DIAGNOSIS — N281 Cyst of kidney, acquired: Secondary | ICD-10-CM | POA: Diagnosis not present

## 2020-08-01 DIAGNOSIS — N1330 Unspecified hydronephrosis: Secondary | ICD-10-CM | POA: Diagnosis not present

## 2020-08-01 DIAGNOSIS — N1831 Chronic kidney disease, stage 3a: Secondary | ICD-10-CM | POA: Diagnosis not present

## 2020-08-01 DIAGNOSIS — N319 Neuromuscular dysfunction of bladder, unspecified: Secondary | ICD-10-CM | POA: Diagnosis not present

## 2020-08-01 DIAGNOSIS — G40909 Epilepsy, unspecified, not intractable, without status epilepticus: Secondary | ICD-10-CM | POA: Diagnosis not present

## 2020-08-01 DIAGNOSIS — I700 Atherosclerosis of aorta: Secondary | ICD-10-CM | POA: Diagnosis not present

## 2020-08-01 DIAGNOSIS — D631 Anemia in chronic kidney disease: Secondary | ICD-10-CM | POA: Diagnosis not present

## 2020-08-01 DIAGNOSIS — I129 Hypertensive chronic kidney disease with stage 1 through stage 4 chronic kidney disease, or unspecified chronic kidney disease: Secondary | ICD-10-CM | POA: Diagnosis not present

## 2020-08-02 DIAGNOSIS — D72829 Elevated white blood cell count, unspecified: Secondary | ICD-10-CM | POA: Diagnosis not present

## 2020-08-02 DIAGNOSIS — N319 Neuromuscular dysfunction of bladder, unspecified: Secondary | ICD-10-CM | POA: Diagnosis not present

## 2020-08-02 DIAGNOSIS — N179 Acute kidney failure, unspecified: Secondary | ICD-10-CM | POA: Diagnosis not present

## 2020-08-02 DIAGNOSIS — N183 Chronic kidney disease, stage 3 unspecified: Secondary | ICD-10-CM | POA: Diagnosis not present

## 2020-08-02 DIAGNOSIS — R531 Weakness: Secondary | ICD-10-CM | POA: Diagnosis not present

## 2020-08-02 DIAGNOSIS — N3000 Acute cystitis without hematuria: Secondary | ICD-10-CM | POA: Diagnosis not present

## 2020-08-04 DIAGNOSIS — N319 Neuromuscular dysfunction of bladder, unspecified: Secondary | ICD-10-CM | POA: Diagnosis not present

## 2020-08-04 DIAGNOSIS — D631 Anemia in chronic kidney disease: Secondary | ICD-10-CM | POA: Diagnosis not present

## 2020-08-04 DIAGNOSIS — G40909 Epilepsy, unspecified, not intractable, without status epilepticus: Secondary | ICD-10-CM | POA: Diagnosis not present

## 2020-08-04 DIAGNOSIS — N281 Cyst of kidney, acquired: Secondary | ICD-10-CM | POA: Diagnosis not present

## 2020-08-04 DIAGNOSIS — I69392 Facial weakness following cerebral infarction: Secondary | ICD-10-CM | POA: Diagnosis not present

## 2020-08-04 DIAGNOSIS — I129 Hypertensive chronic kidney disease with stage 1 through stage 4 chronic kidney disease, or unspecified chronic kidney disease: Secondary | ICD-10-CM | POA: Diagnosis not present

## 2020-08-04 DIAGNOSIS — I69354 Hemiplegia and hemiparesis following cerebral infarction affecting left non-dominant side: Secondary | ICD-10-CM | POA: Diagnosis not present

## 2020-08-04 DIAGNOSIS — N1831 Chronic kidney disease, stage 3a: Secondary | ICD-10-CM | POA: Diagnosis not present

## 2020-08-04 DIAGNOSIS — I700 Atherosclerosis of aorta: Secondary | ICD-10-CM | POA: Diagnosis not present

## 2020-08-04 DIAGNOSIS — N1330 Unspecified hydronephrosis: Secondary | ICD-10-CM | POA: Diagnosis not present

## 2020-08-08 DIAGNOSIS — N1330 Unspecified hydronephrosis: Secondary | ICD-10-CM | POA: Diagnosis not present

## 2020-08-08 DIAGNOSIS — I69354 Hemiplegia and hemiparesis following cerebral infarction affecting left non-dominant side: Secondary | ICD-10-CM | POA: Diagnosis not present

## 2020-08-08 DIAGNOSIS — N281 Cyst of kidney, acquired: Secondary | ICD-10-CM | POA: Diagnosis not present

## 2020-08-08 DIAGNOSIS — I129 Hypertensive chronic kidney disease with stage 1 through stage 4 chronic kidney disease, or unspecified chronic kidney disease: Secondary | ICD-10-CM | POA: Diagnosis not present

## 2020-08-08 DIAGNOSIS — I69392 Facial weakness following cerebral infarction: Secondary | ICD-10-CM | POA: Diagnosis not present

## 2020-08-08 DIAGNOSIS — I700 Atherosclerosis of aorta: Secondary | ICD-10-CM | POA: Diagnosis not present

## 2020-08-08 DIAGNOSIS — N319 Neuromuscular dysfunction of bladder, unspecified: Secondary | ICD-10-CM | POA: Diagnosis not present

## 2020-08-08 DIAGNOSIS — D631 Anemia in chronic kidney disease: Secondary | ICD-10-CM | POA: Diagnosis not present

## 2020-08-08 DIAGNOSIS — G40909 Epilepsy, unspecified, not intractable, without status epilepticus: Secondary | ICD-10-CM | POA: Diagnosis not present

## 2020-08-08 DIAGNOSIS — N1831 Chronic kidney disease, stage 3a: Secondary | ICD-10-CM | POA: Diagnosis not present

## 2020-08-09 DIAGNOSIS — D631 Anemia in chronic kidney disease: Secondary | ICD-10-CM | POA: Diagnosis not present

## 2020-08-09 DIAGNOSIS — N1330 Unspecified hydronephrosis: Secondary | ICD-10-CM | POA: Diagnosis not present

## 2020-08-09 DIAGNOSIS — N281 Cyst of kidney, acquired: Secondary | ICD-10-CM | POA: Diagnosis not present

## 2020-08-09 DIAGNOSIS — N1831 Chronic kidney disease, stage 3a: Secondary | ICD-10-CM | POA: Diagnosis not present

## 2020-08-09 DIAGNOSIS — G40909 Epilepsy, unspecified, not intractable, without status epilepticus: Secondary | ICD-10-CM | POA: Diagnosis not present

## 2020-08-09 DIAGNOSIS — I69354 Hemiplegia and hemiparesis following cerebral infarction affecting left non-dominant side: Secondary | ICD-10-CM | POA: Diagnosis not present

## 2020-08-09 DIAGNOSIS — I700 Atherosclerosis of aorta: Secondary | ICD-10-CM | POA: Diagnosis not present

## 2020-08-09 DIAGNOSIS — I129 Hypertensive chronic kidney disease with stage 1 through stage 4 chronic kidney disease, or unspecified chronic kidney disease: Secondary | ICD-10-CM | POA: Diagnosis not present

## 2020-08-09 DIAGNOSIS — N319 Neuromuscular dysfunction of bladder, unspecified: Secondary | ICD-10-CM | POA: Diagnosis not present

## 2020-08-09 DIAGNOSIS — I69392 Facial weakness following cerebral infarction: Secondary | ICD-10-CM | POA: Diagnosis not present

## 2020-08-16 DIAGNOSIS — I700 Atherosclerosis of aorta: Secondary | ICD-10-CM | POA: Diagnosis not present

## 2020-08-16 DIAGNOSIS — N1831 Chronic kidney disease, stage 3a: Secondary | ICD-10-CM | POA: Diagnosis not present

## 2020-08-16 DIAGNOSIS — G40909 Epilepsy, unspecified, not intractable, without status epilepticus: Secondary | ICD-10-CM | POA: Diagnosis not present

## 2020-08-16 DIAGNOSIS — N281 Cyst of kidney, acquired: Secondary | ICD-10-CM | POA: Diagnosis not present

## 2020-08-16 DIAGNOSIS — I69392 Facial weakness following cerebral infarction: Secondary | ICD-10-CM | POA: Diagnosis not present

## 2020-08-16 DIAGNOSIS — N319 Neuromuscular dysfunction of bladder, unspecified: Secondary | ICD-10-CM | POA: Diagnosis not present

## 2020-08-16 DIAGNOSIS — I129 Hypertensive chronic kidney disease with stage 1 through stage 4 chronic kidney disease, or unspecified chronic kidney disease: Secondary | ICD-10-CM | POA: Diagnosis not present

## 2020-08-16 DIAGNOSIS — D631 Anemia in chronic kidney disease: Secondary | ICD-10-CM | POA: Diagnosis not present

## 2020-08-16 DIAGNOSIS — N1330 Unspecified hydronephrosis: Secondary | ICD-10-CM | POA: Diagnosis not present

## 2020-08-16 DIAGNOSIS — I69354 Hemiplegia and hemiparesis following cerebral infarction affecting left non-dominant side: Secondary | ICD-10-CM | POA: Diagnosis not present

## 2020-08-18 DIAGNOSIS — I129 Hypertensive chronic kidney disease with stage 1 through stage 4 chronic kidney disease, or unspecified chronic kidney disease: Secondary | ICD-10-CM | POA: Diagnosis not present

## 2020-08-18 DIAGNOSIS — N1330 Unspecified hydronephrosis: Secondary | ICD-10-CM | POA: Diagnosis not present

## 2020-08-18 DIAGNOSIS — D631 Anemia in chronic kidney disease: Secondary | ICD-10-CM | POA: Diagnosis not present

## 2020-08-18 DIAGNOSIS — N281 Cyst of kidney, acquired: Secondary | ICD-10-CM | POA: Diagnosis not present

## 2020-08-18 DIAGNOSIS — I700 Atherosclerosis of aorta: Secondary | ICD-10-CM | POA: Diagnosis not present

## 2020-08-18 DIAGNOSIS — I69392 Facial weakness following cerebral infarction: Secondary | ICD-10-CM | POA: Diagnosis not present

## 2020-08-18 DIAGNOSIS — N319 Neuromuscular dysfunction of bladder, unspecified: Secondary | ICD-10-CM | POA: Diagnosis not present

## 2020-08-18 DIAGNOSIS — I69354 Hemiplegia and hemiparesis following cerebral infarction affecting left non-dominant side: Secondary | ICD-10-CM | POA: Diagnosis not present

## 2020-08-18 DIAGNOSIS — G40909 Epilepsy, unspecified, not intractable, without status epilepticus: Secondary | ICD-10-CM | POA: Diagnosis not present

## 2020-08-18 DIAGNOSIS — N1831 Chronic kidney disease, stage 3a: Secondary | ICD-10-CM | POA: Diagnosis not present

## 2020-08-21 DIAGNOSIS — G40909 Epilepsy, unspecified, not intractable, without status epilepticus: Secondary | ICD-10-CM | POA: Diagnosis not present

## 2020-08-21 DIAGNOSIS — N281 Cyst of kidney, acquired: Secondary | ICD-10-CM | POA: Diagnosis not present

## 2020-08-21 DIAGNOSIS — D631 Anemia in chronic kidney disease: Secondary | ICD-10-CM | POA: Diagnosis not present

## 2020-08-21 DIAGNOSIS — I69354 Hemiplegia and hemiparesis following cerebral infarction affecting left non-dominant side: Secondary | ICD-10-CM | POA: Diagnosis not present

## 2020-08-21 DIAGNOSIS — N1330 Unspecified hydronephrosis: Secondary | ICD-10-CM | POA: Diagnosis not present

## 2020-08-21 DIAGNOSIS — N319 Neuromuscular dysfunction of bladder, unspecified: Secondary | ICD-10-CM | POA: Diagnosis not present

## 2020-08-21 DIAGNOSIS — N1831 Chronic kidney disease, stage 3a: Secondary | ICD-10-CM | POA: Diagnosis not present

## 2020-08-21 DIAGNOSIS — I129 Hypertensive chronic kidney disease with stage 1 through stage 4 chronic kidney disease, or unspecified chronic kidney disease: Secondary | ICD-10-CM | POA: Diagnosis not present

## 2020-08-21 DIAGNOSIS — I69392 Facial weakness following cerebral infarction: Secondary | ICD-10-CM | POA: Diagnosis not present

## 2020-08-21 DIAGNOSIS — I700 Atherosclerosis of aorta: Secondary | ICD-10-CM | POA: Diagnosis not present

## 2020-08-22 DIAGNOSIS — G40909 Epilepsy, unspecified, not intractable, without status epilepticus: Secondary | ICD-10-CM | POA: Diagnosis not present

## 2020-08-22 DIAGNOSIS — I69354 Hemiplegia and hemiparesis following cerebral infarction affecting left non-dominant side: Secondary | ICD-10-CM | POA: Diagnosis not present

## 2020-08-22 DIAGNOSIS — N319 Neuromuscular dysfunction of bladder, unspecified: Secondary | ICD-10-CM | POA: Diagnosis not present

## 2020-08-22 DIAGNOSIS — I129 Hypertensive chronic kidney disease with stage 1 through stage 4 chronic kidney disease, or unspecified chronic kidney disease: Secondary | ICD-10-CM | POA: Diagnosis not present

## 2020-08-22 DIAGNOSIS — I700 Atherosclerosis of aorta: Secondary | ICD-10-CM | POA: Diagnosis not present

## 2020-08-22 DIAGNOSIS — N1831 Chronic kidney disease, stage 3a: Secondary | ICD-10-CM | POA: Diagnosis not present

## 2020-08-22 DIAGNOSIS — N1330 Unspecified hydronephrosis: Secondary | ICD-10-CM | POA: Diagnosis not present

## 2020-08-22 DIAGNOSIS — N281 Cyst of kidney, acquired: Secondary | ICD-10-CM | POA: Diagnosis not present

## 2020-08-22 DIAGNOSIS — D631 Anemia in chronic kidney disease: Secondary | ICD-10-CM | POA: Diagnosis not present

## 2020-08-22 DIAGNOSIS — I69392 Facial weakness following cerebral infarction: Secondary | ICD-10-CM | POA: Diagnosis not present

## 2020-08-25 DIAGNOSIS — N1831 Chronic kidney disease, stage 3a: Secondary | ICD-10-CM | POA: Diagnosis not present

## 2020-08-25 DIAGNOSIS — G40909 Epilepsy, unspecified, not intractable, without status epilepticus: Secondary | ICD-10-CM | POA: Diagnosis not present

## 2020-08-25 DIAGNOSIS — N319 Neuromuscular dysfunction of bladder, unspecified: Secondary | ICD-10-CM | POA: Diagnosis not present

## 2020-08-25 DIAGNOSIS — I700 Atherosclerosis of aorta: Secondary | ICD-10-CM | POA: Diagnosis not present

## 2020-08-25 DIAGNOSIS — N281 Cyst of kidney, acquired: Secondary | ICD-10-CM | POA: Diagnosis not present

## 2020-08-25 DIAGNOSIS — N1330 Unspecified hydronephrosis: Secondary | ICD-10-CM | POA: Diagnosis not present

## 2020-08-25 DIAGNOSIS — I129 Hypertensive chronic kidney disease with stage 1 through stage 4 chronic kidney disease, or unspecified chronic kidney disease: Secondary | ICD-10-CM | POA: Diagnosis not present

## 2020-08-25 DIAGNOSIS — I69392 Facial weakness following cerebral infarction: Secondary | ICD-10-CM | POA: Diagnosis not present

## 2020-08-25 DIAGNOSIS — D631 Anemia in chronic kidney disease: Secondary | ICD-10-CM | POA: Diagnosis not present

## 2020-08-25 DIAGNOSIS — I69354 Hemiplegia and hemiparesis following cerebral infarction affecting left non-dominant side: Secondary | ICD-10-CM | POA: Diagnosis not present

## 2020-08-30 DIAGNOSIS — R3914 Feeling of incomplete bladder emptying: Secondary | ICD-10-CM | POA: Diagnosis not present

## 2020-08-30 DIAGNOSIS — R8271 Bacteriuria: Secondary | ICD-10-CM | POA: Diagnosis not present

## 2020-08-30 DIAGNOSIS — N312 Flaccid neuropathic bladder, not elsewhere classified: Secondary | ICD-10-CM | POA: Diagnosis not present

## 2020-08-31 DIAGNOSIS — I69392 Facial weakness following cerebral infarction: Secondary | ICD-10-CM | POA: Diagnosis not present

## 2020-08-31 DIAGNOSIS — N1831 Chronic kidney disease, stage 3a: Secondary | ICD-10-CM | POA: Diagnosis not present

## 2020-08-31 DIAGNOSIS — N281 Cyst of kidney, acquired: Secondary | ICD-10-CM | POA: Diagnosis not present

## 2020-08-31 DIAGNOSIS — D631 Anemia in chronic kidney disease: Secondary | ICD-10-CM | POA: Diagnosis not present

## 2020-08-31 DIAGNOSIS — I129 Hypertensive chronic kidney disease with stage 1 through stage 4 chronic kidney disease, or unspecified chronic kidney disease: Secondary | ICD-10-CM | POA: Diagnosis not present

## 2020-08-31 DIAGNOSIS — I69354 Hemiplegia and hemiparesis following cerebral infarction affecting left non-dominant side: Secondary | ICD-10-CM | POA: Diagnosis not present

## 2020-08-31 DIAGNOSIS — G40909 Epilepsy, unspecified, not intractable, without status epilepticus: Secondary | ICD-10-CM | POA: Diagnosis not present

## 2020-08-31 DIAGNOSIS — I700 Atherosclerosis of aorta: Secondary | ICD-10-CM | POA: Diagnosis not present

## 2020-08-31 DIAGNOSIS — N319 Neuromuscular dysfunction of bladder, unspecified: Secondary | ICD-10-CM | POA: Diagnosis not present

## 2020-08-31 DIAGNOSIS — N1330 Unspecified hydronephrosis: Secondary | ICD-10-CM | POA: Diagnosis not present

## 2020-09-01 DIAGNOSIS — N1330 Unspecified hydronephrosis: Secondary | ICD-10-CM | POA: Diagnosis not present

## 2020-09-01 DIAGNOSIS — I69354 Hemiplegia and hemiparesis following cerebral infarction affecting left non-dominant side: Secondary | ICD-10-CM | POA: Diagnosis not present

## 2020-09-01 DIAGNOSIS — I700 Atherosclerosis of aorta: Secondary | ICD-10-CM | POA: Diagnosis not present

## 2020-09-01 DIAGNOSIS — D631 Anemia in chronic kidney disease: Secondary | ICD-10-CM | POA: Diagnosis not present

## 2020-09-01 DIAGNOSIS — N281 Cyst of kidney, acquired: Secondary | ICD-10-CM | POA: Diagnosis not present

## 2020-09-01 DIAGNOSIS — I69392 Facial weakness following cerebral infarction: Secondary | ICD-10-CM | POA: Diagnosis not present

## 2020-09-01 DIAGNOSIS — N1831 Chronic kidney disease, stage 3a: Secondary | ICD-10-CM | POA: Diagnosis not present

## 2020-09-01 DIAGNOSIS — N319 Neuromuscular dysfunction of bladder, unspecified: Secondary | ICD-10-CM | POA: Diagnosis not present

## 2020-09-01 DIAGNOSIS — G40909 Epilepsy, unspecified, not intractable, without status epilepticus: Secondary | ICD-10-CM | POA: Diagnosis not present

## 2020-09-01 DIAGNOSIS — I129 Hypertensive chronic kidney disease with stage 1 through stage 4 chronic kidney disease, or unspecified chronic kidney disease: Secondary | ICD-10-CM | POA: Diagnosis not present

## 2020-09-07 DIAGNOSIS — I700 Atherosclerosis of aorta: Secondary | ICD-10-CM | POA: Diagnosis not present

## 2020-09-07 DIAGNOSIS — N281 Cyst of kidney, acquired: Secondary | ICD-10-CM | POA: Diagnosis not present

## 2020-09-07 DIAGNOSIS — N1330 Unspecified hydronephrosis: Secondary | ICD-10-CM | POA: Diagnosis not present

## 2020-09-07 DIAGNOSIS — N319 Neuromuscular dysfunction of bladder, unspecified: Secondary | ICD-10-CM | POA: Diagnosis not present

## 2020-09-07 DIAGNOSIS — D631 Anemia in chronic kidney disease: Secondary | ICD-10-CM | POA: Diagnosis not present

## 2020-09-07 DIAGNOSIS — I69392 Facial weakness following cerebral infarction: Secondary | ICD-10-CM | POA: Diagnosis not present

## 2020-09-07 DIAGNOSIS — I69354 Hemiplegia and hemiparesis following cerebral infarction affecting left non-dominant side: Secondary | ICD-10-CM | POA: Diagnosis not present

## 2020-09-07 DIAGNOSIS — G40909 Epilepsy, unspecified, not intractable, without status epilepticus: Secondary | ICD-10-CM | POA: Diagnosis not present

## 2020-09-07 DIAGNOSIS — N1831 Chronic kidney disease, stage 3a: Secondary | ICD-10-CM | POA: Diagnosis not present

## 2020-09-07 DIAGNOSIS — I129 Hypertensive chronic kidney disease with stage 1 through stage 4 chronic kidney disease, or unspecified chronic kidney disease: Secondary | ICD-10-CM | POA: Diagnosis not present

## 2020-09-08 DIAGNOSIS — R339 Retention of urine, unspecified: Secondary | ICD-10-CM | POA: Diagnosis not present

## 2020-09-08 DIAGNOSIS — N319 Neuromuscular dysfunction of bladder, unspecified: Secondary | ICD-10-CM | POA: Diagnosis not present

## 2020-09-13 DIAGNOSIS — R32 Unspecified urinary incontinence: Secondary | ICD-10-CM | POA: Diagnosis not present

## 2020-09-13 DIAGNOSIS — N3090 Cystitis, unspecified without hematuria: Secondary | ICD-10-CM | POA: Diagnosis not present

## 2020-09-13 DIAGNOSIS — B961 Klebsiella pneumoniae [K. pneumoniae] as the cause of diseases classified elsewhere: Secondary | ICD-10-CM | POA: Diagnosis not present

## 2020-09-13 DIAGNOSIS — R339 Retention of urine, unspecified: Secondary | ICD-10-CM | POA: Diagnosis not present

## 2020-09-14 DIAGNOSIS — I69392 Facial weakness following cerebral infarction: Secondary | ICD-10-CM | POA: Diagnosis not present

## 2020-09-14 DIAGNOSIS — I700 Atherosclerosis of aorta: Secondary | ICD-10-CM | POA: Diagnosis not present

## 2020-09-14 DIAGNOSIS — N319 Neuromuscular dysfunction of bladder, unspecified: Secondary | ICD-10-CM | POA: Diagnosis not present

## 2020-09-14 DIAGNOSIS — G40909 Epilepsy, unspecified, not intractable, without status epilepticus: Secondary | ICD-10-CM | POA: Diagnosis not present

## 2020-09-14 DIAGNOSIS — I69354 Hemiplegia and hemiparesis following cerebral infarction affecting left non-dominant side: Secondary | ICD-10-CM | POA: Diagnosis not present

## 2020-09-14 DIAGNOSIS — N1330 Unspecified hydronephrosis: Secondary | ICD-10-CM | POA: Diagnosis not present

## 2020-09-14 DIAGNOSIS — N1831 Chronic kidney disease, stage 3a: Secondary | ICD-10-CM | POA: Diagnosis not present

## 2020-09-14 DIAGNOSIS — D631 Anemia in chronic kidney disease: Secondary | ICD-10-CM | POA: Diagnosis not present

## 2020-09-14 DIAGNOSIS — N281 Cyst of kidney, acquired: Secondary | ICD-10-CM | POA: Diagnosis not present

## 2020-09-14 DIAGNOSIS — I129 Hypertensive chronic kidney disease with stage 1 through stage 4 chronic kidney disease, or unspecified chronic kidney disease: Secondary | ICD-10-CM | POA: Diagnosis not present

## 2020-09-21 DIAGNOSIS — I69392 Facial weakness following cerebral infarction: Secondary | ICD-10-CM | POA: Diagnosis not present

## 2020-09-21 DIAGNOSIS — D631 Anemia in chronic kidney disease: Secondary | ICD-10-CM | POA: Diagnosis not present

## 2020-09-21 DIAGNOSIS — N1330 Unspecified hydronephrosis: Secondary | ICD-10-CM | POA: Diagnosis not present

## 2020-09-21 DIAGNOSIS — N1831 Chronic kidney disease, stage 3a: Secondary | ICD-10-CM | POA: Diagnosis not present

## 2020-09-21 DIAGNOSIS — N281 Cyst of kidney, acquired: Secondary | ICD-10-CM | POA: Diagnosis not present

## 2020-09-21 DIAGNOSIS — G40909 Epilepsy, unspecified, not intractable, without status epilepticus: Secondary | ICD-10-CM | POA: Diagnosis not present

## 2020-09-21 DIAGNOSIS — I700 Atherosclerosis of aorta: Secondary | ICD-10-CM | POA: Diagnosis not present

## 2020-09-21 DIAGNOSIS — I69354 Hemiplegia and hemiparesis following cerebral infarction affecting left non-dominant side: Secondary | ICD-10-CM | POA: Diagnosis not present

## 2020-09-21 DIAGNOSIS — N319 Neuromuscular dysfunction of bladder, unspecified: Secondary | ICD-10-CM | POA: Diagnosis not present

## 2020-09-21 DIAGNOSIS — I129 Hypertensive chronic kidney disease with stage 1 through stage 4 chronic kidney disease, or unspecified chronic kidney disease: Secondary | ICD-10-CM | POA: Diagnosis not present

## 2020-09-28 DIAGNOSIS — I69354 Hemiplegia and hemiparesis following cerebral infarction affecting left non-dominant side: Secondary | ICD-10-CM | POA: Diagnosis not present

## 2020-09-28 DIAGNOSIS — N319 Neuromuscular dysfunction of bladder, unspecified: Secondary | ICD-10-CM | POA: Diagnosis not present

## 2020-09-28 DIAGNOSIS — N281 Cyst of kidney, acquired: Secondary | ICD-10-CM | POA: Diagnosis not present

## 2020-09-28 DIAGNOSIS — N1831 Chronic kidney disease, stage 3a: Secondary | ICD-10-CM | POA: Diagnosis not present

## 2020-09-28 DIAGNOSIS — I129 Hypertensive chronic kidney disease with stage 1 through stage 4 chronic kidney disease, or unspecified chronic kidney disease: Secondary | ICD-10-CM | POA: Diagnosis not present

## 2020-09-28 DIAGNOSIS — I700 Atherosclerosis of aorta: Secondary | ICD-10-CM | POA: Diagnosis not present

## 2020-09-28 DIAGNOSIS — I69392 Facial weakness following cerebral infarction: Secondary | ICD-10-CM | POA: Diagnosis not present

## 2020-09-28 DIAGNOSIS — D631 Anemia in chronic kidney disease: Secondary | ICD-10-CM | POA: Diagnosis not present

## 2020-09-28 DIAGNOSIS — N1330 Unspecified hydronephrosis: Secondary | ICD-10-CM | POA: Diagnosis not present

## 2020-09-28 DIAGNOSIS — G40909 Epilepsy, unspecified, not intractable, without status epilepticus: Secondary | ICD-10-CM | POA: Diagnosis not present

## 2020-11-09 DIAGNOSIS — I129 Hypertensive chronic kidney disease with stage 1 through stage 4 chronic kidney disease, or unspecified chronic kidney disease: Secondary | ICD-10-CM | POA: Diagnosis not present

## 2020-11-09 DIAGNOSIS — R69 Illness, unspecified: Secondary | ICD-10-CM | POA: Diagnosis not present

## 2020-11-09 DIAGNOSIS — E559 Vitamin D deficiency, unspecified: Secondary | ICD-10-CM | POA: Diagnosis not present

## 2020-11-09 DIAGNOSIS — I700 Atherosclerosis of aorta: Secondary | ICD-10-CM | POA: Diagnosis not present

## 2020-11-09 DIAGNOSIS — D693 Immune thrombocytopenic purpura: Secondary | ICD-10-CM | POA: Diagnosis not present

## 2020-11-09 DIAGNOSIS — M810 Age-related osteoporosis without current pathological fracture: Secondary | ICD-10-CM | POA: Diagnosis not present

## 2020-11-09 DIAGNOSIS — N183 Chronic kidney disease, stage 3 unspecified: Secondary | ICD-10-CM | POA: Diagnosis not present

## 2020-11-09 DIAGNOSIS — I69359 Hemiplegia and hemiparesis following cerebral infarction affecting unspecified side: Secondary | ICD-10-CM | POA: Diagnosis not present

## 2020-11-09 DIAGNOSIS — D68312 Antiphospholipid antibody with hemorrhagic disorder: Secondary | ICD-10-CM | POA: Diagnosis not present

## 2020-11-09 DIAGNOSIS — E785 Hyperlipidemia, unspecified: Secondary | ICD-10-CM | POA: Diagnosis not present

## 2020-11-21 DIAGNOSIS — H04123 Dry eye syndrome of bilateral lacrimal glands: Secondary | ICD-10-CM | POA: Diagnosis not present

## 2020-11-21 DIAGNOSIS — Z961 Presence of intraocular lens: Secondary | ICD-10-CM | POA: Diagnosis not present

## 2020-11-21 DIAGNOSIS — H0102A Squamous blepharitis right eye, upper and lower eyelids: Secondary | ICD-10-CM | POA: Diagnosis not present

## 2020-12-01 DIAGNOSIS — R339 Retention of urine, unspecified: Secondary | ICD-10-CM | POA: Diagnosis not present

## 2020-12-01 DIAGNOSIS — N319 Neuromuscular dysfunction of bladder, unspecified: Secondary | ICD-10-CM | POA: Diagnosis not present

## 2020-12-13 DIAGNOSIS — N390 Urinary tract infection, site not specified: Secondary | ICD-10-CM | POA: Diagnosis not present

## 2021-01-04 ENCOUNTER — Other Ambulatory Visit: Payer: Self-pay | Admitting: Hematology & Oncology

## 2021-01-04 DIAGNOSIS — D693 Immune thrombocytopenic purpura: Secondary | ICD-10-CM

## 2021-01-05 NOTE — Telephone Encounter (Signed)
Please call for follow up apt.

## 2021-01-24 DIAGNOSIS — G894 Chronic pain syndrome: Secondary | ICD-10-CM | POA: Diagnosis not present

## 2021-01-24 DIAGNOSIS — M1611 Unilateral primary osteoarthritis, right hip: Secondary | ICD-10-CM | POA: Diagnosis not present

## 2021-03-06 DIAGNOSIS — D6861 Antiphospholipid syndrome: Secondary | ICD-10-CM | POA: Diagnosis not present

## 2021-03-06 DIAGNOSIS — Z8673 Personal history of transient ischemic attack (TIA), and cerebral infarction without residual deficits: Secondary | ICD-10-CM | POA: Diagnosis not present

## 2021-03-06 DIAGNOSIS — Z789 Other specified health status: Secondary | ICD-10-CM | POA: Diagnosis not present

## 2021-03-06 DIAGNOSIS — Z7409 Other reduced mobility: Secondary | ICD-10-CM | POA: Diagnosis not present

## 2021-03-06 DIAGNOSIS — Z79899 Other long term (current) drug therapy: Secondary | ICD-10-CM | POA: Diagnosis not present

## 2021-03-06 DIAGNOSIS — R252 Cramp and spasm: Secondary | ICD-10-CM | POA: Diagnosis not present

## 2021-03-06 DIAGNOSIS — Z7901 Long term (current) use of anticoagulants: Secondary | ICD-10-CM | POA: Diagnosis not present

## 2021-03-06 DIAGNOSIS — G8110 Spastic hemiplegia affecting unspecified side: Secondary | ICD-10-CM | POA: Diagnosis not present

## 2021-04-03 DIAGNOSIS — R339 Retention of urine, unspecified: Secondary | ICD-10-CM | POA: Diagnosis not present

## 2021-04-03 DIAGNOSIS — N319 Neuromuscular dysfunction of bladder, unspecified: Secondary | ICD-10-CM | POA: Diagnosis not present

## 2021-05-07 DIAGNOSIS — S99829A Other specified injuries of unspecified foot, initial encounter: Secondary | ICD-10-CM | POA: Diagnosis not present

## 2021-05-07 DIAGNOSIS — N183 Chronic kidney disease, stage 3 unspecified: Secondary | ICD-10-CM | POA: Diagnosis not present

## 2021-05-07 DIAGNOSIS — D68312 Antiphospholipid antibody with hemorrhagic disorder: Secondary | ICD-10-CM | POA: Diagnosis not present

## 2021-05-07 DIAGNOSIS — I69359 Hemiplegia and hemiparesis following cerebral infarction affecting unspecified side: Secondary | ICD-10-CM | POA: Diagnosis not present

## 2021-05-07 DIAGNOSIS — M810 Age-related osteoporosis without current pathological fracture: Secondary | ICD-10-CM | POA: Diagnosis not present

## 2021-05-07 DIAGNOSIS — I129 Hypertensive chronic kidney disease with stage 1 through stage 4 chronic kidney disease, or unspecified chronic kidney disease: Secondary | ICD-10-CM | POA: Diagnosis not present

## 2021-05-07 DIAGNOSIS — R69 Illness, unspecified: Secondary | ICD-10-CM | POA: Diagnosis not present

## 2021-05-07 DIAGNOSIS — H9193 Unspecified hearing loss, bilateral: Secondary | ICD-10-CM | POA: Diagnosis not present

## 2021-05-07 DIAGNOSIS — D693 Immune thrombocytopenic purpura: Secondary | ICD-10-CM | POA: Diagnosis not present

## 2021-05-07 DIAGNOSIS — E785 Hyperlipidemia, unspecified: Secondary | ICD-10-CM | POA: Diagnosis not present

## 2021-05-14 ENCOUNTER — Other Ambulatory Visit: Payer: Self-pay | Admitting: Family

## 2021-05-14 DIAGNOSIS — D693 Immune thrombocytopenic purpura: Secondary | ICD-10-CM

## 2021-05-14 DIAGNOSIS — D6862 Lupus anticoagulant syndrome: Secondary | ICD-10-CM

## 2021-05-15 ENCOUNTER — Inpatient Hospital Stay: Payer: Medicare HMO

## 2021-05-15 ENCOUNTER — Inpatient Hospital Stay: Payer: Medicare HMO | Admitting: Family

## 2021-05-21 DIAGNOSIS — M21372 Foot drop, left foot: Secondary | ICD-10-CM | POA: Diagnosis not present

## 2021-05-23 ENCOUNTER — Inpatient Hospital Stay: Payer: Medicare HMO | Admitting: Family

## 2021-05-23 ENCOUNTER — Inpatient Hospital Stay: Payer: Medicare HMO | Attending: Hematology & Oncology

## 2021-05-23 ENCOUNTER — Encounter: Payer: Self-pay | Admitting: Family

## 2021-05-23 ENCOUNTER — Other Ambulatory Visit: Payer: Self-pay

## 2021-05-23 DIAGNOSIS — R531 Weakness: Secondary | ICD-10-CM

## 2021-05-23 DIAGNOSIS — G629 Polyneuropathy, unspecified: Secondary | ICD-10-CM | POA: Diagnosis not present

## 2021-05-23 DIAGNOSIS — D693 Immune thrombocytopenic purpura: Secondary | ICD-10-CM | POA: Diagnosis not present

## 2021-05-23 DIAGNOSIS — Z7901 Long term (current) use of anticoagulants: Secondary | ICD-10-CM | POA: Insufficient documentation

## 2021-05-23 DIAGNOSIS — D6862 Lupus anticoagulant syndrome: Secondary | ICD-10-CM | POA: Insufficient documentation

## 2021-05-23 DIAGNOSIS — R21 Rash and other nonspecific skin eruption: Secondary | ICD-10-CM | POA: Insufficient documentation

## 2021-05-23 DIAGNOSIS — I69354 Hemiplegia and hemiparesis following cerebral infarction affecting left non-dominant side: Secondary | ICD-10-CM

## 2021-05-23 DIAGNOSIS — D6852 Prothrombin gene mutation: Secondary | ICD-10-CM

## 2021-05-23 LAB — CBC WITH DIFFERENTIAL (CANCER CENTER ONLY)
Abs Immature Granulocytes: 0.06 10*3/uL (ref 0.00–0.07)
Basophils Absolute: 0.1 10*3/uL (ref 0.0–0.1)
Basophils Relative: 1 %
Eosinophils Absolute: 0.5 10*3/uL (ref 0.0–0.5)
Eosinophils Relative: 5 %
HCT: 44.8 % (ref 39.0–52.0)
Hemoglobin: 14.7 g/dL (ref 13.0–17.0)
Immature Granulocytes: 1 %
Lymphocytes Relative: 33 %
Lymphs Abs: 3.2 10*3/uL (ref 0.7–4.0)
MCH: 29.8 pg (ref 26.0–34.0)
MCHC: 32.8 g/dL (ref 30.0–36.0)
MCV: 90.7 fL (ref 80.0–100.0)
Monocytes Absolute: 1.6 10*3/uL — ABNORMAL HIGH (ref 0.1–1.0)
Monocytes Relative: 16 %
Neutro Abs: 4.3 10*3/uL (ref 1.7–7.7)
Neutrophils Relative %: 44 %
Platelet Count: 260 10*3/uL (ref 150–400)
RBC: 4.94 MIL/uL (ref 4.22–5.81)
RDW: 14.3 % (ref 11.5–15.5)
WBC Count: 9.7 10*3/uL (ref 4.0–10.5)
nRBC: 0 % (ref 0.0–0.2)

## 2021-05-23 LAB — CMP (CANCER CENTER ONLY)
ALT: 18 U/L (ref 0–44)
AST: 17 U/L (ref 15–41)
Albumin: 4.6 g/dL (ref 3.5–5.0)
Alkaline Phosphatase: 73 U/L (ref 38–126)
Anion gap: 8 (ref 5–15)
BUN: 37 mg/dL — ABNORMAL HIGH (ref 8–23)
CO2: 28 mmol/L (ref 22–32)
Calcium: 9.8 mg/dL (ref 8.9–10.3)
Chloride: 104 mmol/L (ref 98–111)
Creatinine: 1.52 mg/dL — ABNORMAL HIGH (ref 0.61–1.24)
GFR, Estimated: 47 mL/min — ABNORMAL LOW (ref >60)
Glucose, Bld: 85 mg/dL (ref 70–99)
Potassium: 4.8 mmol/L (ref 3.5–5.1)
Sodium: 140 mmol/L (ref 135–145)
Total Bilirubin: 0.5 mg/dL (ref 0.3–1.2)
Total Protein: 7.5 g/dL (ref 6.5–8.1)

## 2021-05-23 LAB — SAVE SMEAR(SSMR), FOR PROVIDER SLIDE REVIEW

## 2021-05-23 LAB — LACTATE DEHYDROGENASE: LDH: 193 U/L — ABNORMAL HIGH (ref 98–192)

## 2021-05-23 MED ORDER — DEXAMETHASONE 4 MG PO TABS: ORAL_TABLET | ORAL | 0 refills | Status: DC

## 2021-05-23 NOTE — Progress Notes (Signed)
Hematology and Oncology Follow Up Visit  Mark [name] [id number] 1945-05-14 76 y.o. 05/23/2021   Principle Diagnosis:  1. Refractory immune thrombocytopenia. 2. 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 3. Positive lupus anticoagulant.   Current Therapy:        Xarelto 20 mg PO daily   Interim History:  Mark [name] is here today with his wife for follow-up.Marland Kitchen He is doing well but has noted a pale pink speckled rash mostly on the left forearm and minimal on the right forearm. No itching or burning noted. No redness or edema.  Platelets are stable at 260, Hgb 14.7, MCV 90 and WBC count 9.7.  He has not noted any blood loss. No abnormal bruising, no petechiae.  No fever, chills, n/v, cough, rash, dizziness, SOB, chest pain, palpitations, abdominal pain or changes in bowel or bladder habits.  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  He states that he has had several falls but thankfully has not been seriously injured.  He is eating well and doing his best to stay well hydrated. His weight is 186 lbs.   ECOG Performance Status: 1 - Symptomatic but completely ambulatory  Medications:  Allergies as of 05/23/2021       Reactions   Diphenhydramine    Other reaction(s): affects kidney   Famotidine    Other reaction(s): affects kidneys        Medication List        Accurate as of May 23, 2021  3:59 PM. If you have any questions, ask your nurse or doctor.          acetaminophen 325 MG tablet Commonly known as: TYLENOL Take 650 mg by mouth every morning.   amLODipine 5 MG tablet Commonly known as: NORVASC Take 5 mg by mouth daily.   aspirin EC 81 MG tablet Take 81 mg by mouth daily. Swallow whole.   buPROPion 150 MG 24 hr tablet Commonly known as: WELLBUTRIN XL Take 150 mg by mouth daily.   Crestor 20 MG tablet Generic drug: rosuvastatin Take 20 mg by mouth at bedtime.   dexamethasone 4 MG tablet Commonly  known as: DECADRON TAKE 10 TABLETS BY MOUTH ONCE DAILY AS NEEDED FOR 3 DAYS FOR  ITP   fluticasone 50 MCG/ACT nasal spray Commonly known as: FLONASE Place 2 sprays into both nostrils daily as needed for allergies or rhinitis.   levETIRAcetam 500 MG tablet Commonly known as: KEPPRA Take 1,500 mg by mouth 2 (two) times daily.   Melatonin 10 MG Tabs Take 10 mg by mouth at bedtime.   omeprazole 40 MG capsule Commonly known as: PRILOSEC Take 40 mg by mouth every morning.   polyethylene glycol 17 g packet Commonly known as: MIRALAX / GLYCOLAX Take 17 g by mouth daily as needed. What changed: reasons to take this   sertraline 100 MG tablet Commonly known as: ZOLOFT Take 200 mg by mouth in the morning.   Vitamin D3 125 MCG (5000 UT) Tabs Take 5,000 Units by mouth every other day.   Xarelto 20 MG Tabs tablet Generic drug: rivaroxaban Take 1 tablet by mouth once daily with breakfast What changed: how much to take        Allergies:  Allergies  Allergen Reactions   Diphenhydramine     Other reaction(s): affects kidney   Famotidine     Other reaction(s): affects kidneys    Past Medical History, Surgical history, Social history, and Family History were reviewed and updated.  Review of Systems: All other 10 point review of systems is negative.   Physical Exam:  weight is 186 lb 12.8 oz (84.7 kg). His oral temperature is 98.2 F (36.8 C). His blood pressure is 116/70 and his pulse is 75. His respiration is 18 and oxygen saturation is 97%.   Wt Readings from Last 3 Encounters:  05/23/21 186 lb 12.8 oz (84.7 kg)  06/29/20 189 lb (85.7 kg)  02/17/20 193 lb (87.5 kg)    Ocular: Sclerae unicteric, pupils equal, round and reactive to light Ear-nose-throat: Oropharynx clear, dentition fair Lymphatic: No cervical or supraclavicular adenopathy Lungs no rales or rhonchi, good excursion bilaterally Heart regular rate and rhythm, no murmur appreciated Abd soft, nontender,  positive bowel sounds MSK no focal spinal tenderness, no joint edema Neuro: non-focal, well-oriented, appropriate affect Breasts: Deferred   Lab Results  Component Value Date   WBC 9.7 05/23/2021   HGB 14.7 05/23/2021   HCT 44.8 05/23/2021   MCV 90.7 05/23/2021   PLT 260 05/23/2021   Lab Results  Component Value Date   FERRITIN 1,063 (H) 10/23/2016   IRON 15 (L) 06/30/2020   TIBC 196 (L) 06/30/2020   UIBC 181 06/30/2020   IRONPCTSAT 8 (L) 06/30/2020   Lab Results  Component Value Date   RETICCTPCT 1.5 08/20/2010   RBC 4.94 05/23/2021   RETICCTABS 75.3 08/20/2010   No results found for: KPAFRELGTCHN, LAMBDASER, KAPLAMBRATIO No results found for: IGGSERUM, IGA, IGMSERUM No results found for: Odetta Pink, SPEI   Chemistry      Component Value Date/Time   NA 140 05/23/2021 1445   NA 146 (H) 04/09/2017 1405   NA 138 05/09/2016 1321   K 4.8 05/23/2021 1445   K 4.0 04/09/2017 1405   K 4.3 05/09/2016 1321   CL 104 05/23/2021 1445   CL 108 04/09/2017 1405   CO2 28 05/23/2021 1445   CO2 23 04/09/2017 1405   CO2 22 05/09/2016 1321   BUN 37 (H) 05/23/2021 1445   BUN 34 (H) 04/09/2017 1405   BUN 36.4 (H) 05/09/2016 1321   CREATININE 1.52 (H) 05/23/2021 1445   CREATININE 1.3 (H) 04/09/2017 1405   CREATININE 1.5 (H) 05/09/2016 1321      Component Value Date/Time   CALCIUM 9.8 05/23/2021 1445   CALCIUM 9.1 04/09/2017 1405   CALCIUM 9.9 05/09/2016 1321   ALKPHOS 73 05/23/2021 1445   ALKPHOS 61 04/09/2017 1405   ALKPHOS 103 05/09/2016 1321   AST 17 05/23/2021 1445   AST 39 (H) 05/09/2016 1321   ALT 18 05/23/2021 1445   ALT 57 (H) 04/09/2017 1405   ALT 64 (H) 05/09/2016 1321   BILITOT 0.5 05/23/2021 1445   BILITOT 0.49 05/09/2016 1321       Impression and Plan: Mark [name] is a very pleasant 76 yo caucasian gentleman with refractory immune thrombocytopenia. He has history of thrombotic CVA with residual left  sided weakness. He continues to do well on Xarelto.  Platelets remain stable. Asymptomatic at this time.  Decadron refilled for PRN use for ITP.  Follow-up as needed.   Lottie Dawson, NP 2/8/20233:59 PM

## 2021-05-26 LAB — LUPUS ANTICOAGULANT PANEL
DRVVT: 142.6 s — ABNORMAL HIGH (ref 0.0–47.0)
PTT Lupus Anticoagulant: 77.1 s — ABNORMAL HIGH (ref 0.0–43.5)

## 2021-05-26 LAB — DRVVT CONFIRM: dRVVT Confirm: 2.5 ratio — ABNORMAL HIGH (ref 0.8–1.2)

## 2021-05-26 LAB — PTT-LA MIX: PTT-LA Mix: 67.7 s — ABNORMAL HIGH (ref 0.0–40.5)

## 2021-05-26 LAB — DRVVT MIX: dRVVT Mix: 91.8 s — ABNORMAL HIGH (ref 0.0–40.4)

## 2021-05-26 LAB — HEXAGONAL PHASE PHOSPHOLIPID: Hexagonal Phase Phospholipid: 40 s — ABNORMAL HIGH (ref 0–11)

## 2021-05-30 ENCOUNTER — Telehealth: Payer: Self-pay | Admitting: *Deleted

## 2021-05-30 NOTE — Telephone Encounter (Signed)
Per 05/23/21 los -  Follow-up as needed.

## 2021-05-31 ENCOUNTER — Other Ambulatory Visit: Payer: Medicare HMO

## 2021-05-31 ENCOUNTER — Ambulatory Visit: Payer: Medicare HMO | Admitting: Hematology & Oncology

## 2021-07-05 DIAGNOSIS — H903 Sensorineural hearing loss, bilateral: Secondary | ICD-10-CM | POA: Diagnosis not present

## 2021-08-10 DIAGNOSIS — R339 Retention of urine, unspecified: Secondary | ICD-10-CM | POA: Diagnosis not present

## 2021-08-10 DIAGNOSIS — N319 Neuromuscular dysfunction of bladder, unspecified: Secondary | ICD-10-CM | POA: Diagnosis not present

## 2021-11-22 DIAGNOSIS — N319 Neuromuscular dysfunction of bladder, unspecified: Secondary | ICD-10-CM | POA: Diagnosis not present

## 2021-11-22 DIAGNOSIS — R339 Retention of urine, unspecified: Secondary | ICD-10-CM | POA: Diagnosis not present

## 2021-12-12 DIAGNOSIS — I700 Atherosclerosis of aorta: Secondary | ICD-10-CM | POA: Diagnosis not present

## 2021-12-12 DIAGNOSIS — D693 Immune thrombocytopenic purpura: Secondary | ICD-10-CM | POA: Diagnosis not present

## 2021-12-12 DIAGNOSIS — E785 Hyperlipidemia, unspecified: Secondary | ICD-10-CM | POA: Diagnosis not present

## 2021-12-12 DIAGNOSIS — R69 Illness, unspecified: Secondary | ICD-10-CM | POA: Diagnosis not present

## 2021-12-12 DIAGNOSIS — I129 Hypertensive chronic kidney disease with stage 1 through stage 4 chronic kidney disease, or unspecified chronic kidney disease: Secondary | ICD-10-CM | POA: Diagnosis not present

## 2021-12-12 DIAGNOSIS — M810 Age-related osteoporosis without current pathological fracture: Secondary | ICD-10-CM | POA: Diagnosis not present

## 2021-12-12 DIAGNOSIS — G40909 Epilepsy, unspecified, not intractable, without status epilepticus: Secondary | ICD-10-CM | POA: Diagnosis not present

## 2021-12-12 DIAGNOSIS — D68312 Antiphospholipid antibody with hemorrhagic disorder: Secondary | ICD-10-CM | POA: Diagnosis not present

## 2021-12-12 DIAGNOSIS — I2510 Atherosclerotic heart disease of native coronary artery without angina pectoris: Secondary | ICD-10-CM | POA: Diagnosis not present

## 2021-12-12 DIAGNOSIS — N183 Chronic kidney disease, stage 3 unspecified: Secondary | ICD-10-CM | POA: Diagnosis not present

## 2022-01-12 DIAGNOSIS — R69 Illness, unspecified: Secondary | ICD-10-CM | POA: Diagnosis not present

## 2022-01-23 DIAGNOSIS — M5432 Sciatica, left side: Secondary | ICD-10-CM | POA: Diagnosis not present

## 2022-01-23 DIAGNOSIS — R051 Acute cough: Secondary | ICD-10-CM | POA: Diagnosis not present

## 2022-01-23 DIAGNOSIS — M21372 Foot drop, left foot: Secondary | ICD-10-CM | POA: Diagnosis not present

## 2022-01-23 DIAGNOSIS — I69359 Hemiplegia and hemiparesis following cerebral infarction affecting unspecified side: Secondary | ICD-10-CM | POA: Diagnosis not present

## 2022-01-23 DIAGNOSIS — R0981 Nasal congestion: Secondary | ICD-10-CM | POA: Diagnosis not present

## 2022-02-12 DIAGNOSIS — F330 Major depressive disorder, recurrent, mild: Secondary | ICD-10-CM | POA: Diagnosis not present

## 2022-02-12 DIAGNOSIS — R69 Illness, unspecified: Secondary | ICD-10-CM | POA: Diagnosis not present

## 2022-02-26 DIAGNOSIS — Z7409 Other reduced mobility: Secondary | ICD-10-CM | POA: Diagnosis not present

## 2022-02-26 DIAGNOSIS — G8194 Hemiplegia, unspecified affecting left nondominant side: Secondary | ICD-10-CM | POA: Diagnosis not present

## 2022-02-26 DIAGNOSIS — Z789 Other specified health status: Secondary | ICD-10-CM | POA: Diagnosis not present

## 2022-02-26 DIAGNOSIS — Z5181 Encounter for therapeutic drug level monitoring: Secondary | ICD-10-CM | POA: Diagnosis not present

## 2022-02-26 DIAGNOSIS — Z79899 Other long term (current) drug therapy: Secondary | ICD-10-CM | POA: Diagnosis not present

## 2022-02-26 DIAGNOSIS — Z888 Allergy status to other drugs, medicaments and biological substances status: Secondary | ICD-10-CM | POA: Diagnosis not present

## 2022-02-26 DIAGNOSIS — Z7901 Long term (current) use of anticoagulants: Secondary | ICD-10-CM | POA: Diagnosis not present

## 2022-02-26 DIAGNOSIS — G8110 Spastic hemiplegia affecting unspecified side: Secondary | ICD-10-CM | POA: Diagnosis not present

## 2022-03-04 DIAGNOSIS — N319 Neuromuscular dysfunction of bladder, unspecified: Secondary | ICD-10-CM | POA: Diagnosis not present

## 2022-03-04 DIAGNOSIS — R339 Retention of urine, unspecified: Secondary | ICD-10-CM | POA: Diagnosis not present

## 2022-03-14 DIAGNOSIS — R69 Illness, unspecified: Secondary | ICD-10-CM | POA: Diagnosis not present

## 2022-03-14 DIAGNOSIS — F330 Major depressive disorder, recurrent, mild: Secondary | ICD-10-CM | POA: Diagnosis not present

## 2022-03-18 DIAGNOSIS — G894 Chronic pain syndrome: Secondary | ICD-10-CM | POA: Diagnosis not present

## 2022-03-18 DIAGNOSIS — R69 Illness, unspecified: Secondary | ICD-10-CM | POA: Diagnosis not present

## 2022-03-18 DIAGNOSIS — E785 Hyperlipidemia, unspecified: Secondary | ICD-10-CM | POA: Diagnosis not present

## 2022-03-18 DIAGNOSIS — I69359 Hemiplegia and hemiparesis following cerebral infarction affecting unspecified side: Secondary | ICD-10-CM | POA: Diagnosis not present

## 2022-03-18 DIAGNOSIS — Z79899 Other long term (current) drug therapy: Secondary | ICD-10-CM | POA: Diagnosis not present

## 2022-03-18 DIAGNOSIS — I129 Hypertensive chronic kidney disease with stage 1 through stage 4 chronic kidney disease, or unspecified chronic kidney disease: Secondary | ICD-10-CM | POA: Diagnosis not present

## 2022-04-14 DIAGNOSIS — F330 Major depressive disorder, recurrent, mild: Secondary | ICD-10-CM | POA: Diagnosis not present

## 2022-04-14 DIAGNOSIS — R69 Illness, unspecified: Secondary | ICD-10-CM | POA: Diagnosis not present

## 2022-04-29 ENCOUNTER — Other Ambulatory Visit: Payer: Self-pay

## 2022-04-29 DIAGNOSIS — D693 Immune thrombocytopenic purpura: Secondary | ICD-10-CM

## 2022-04-29 MED ORDER — DEXAMETHASONE 4 MG PO TABS: ORAL_TABLET | ORAL | 3 refills | Status: DC

## 2022-06-03 DIAGNOSIS — R339 Retention of urine, unspecified: Secondary | ICD-10-CM | POA: Diagnosis not present

## 2022-06-03 DIAGNOSIS — N319 Neuromuscular dysfunction of bladder, unspecified: Secondary | ICD-10-CM | POA: Diagnosis not present

## 2022-06-27 DIAGNOSIS — H903 Sensorineural hearing loss, bilateral: Secondary | ICD-10-CM | POA: Diagnosis not present

## 2022-07-24 DIAGNOSIS — D68312 Antiphospholipid antibody with hemorrhagic disorder: Secondary | ICD-10-CM | POA: Diagnosis not present

## 2022-07-24 DIAGNOSIS — E441 Mild protein-calorie malnutrition: Secondary | ICD-10-CM | POA: Diagnosis not present

## 2022-07-24 DIAGNOSIS — N183 Chronic kidney disease, stage 3 unspecified: Secondary | ICD-10-CM | POA: Diagnosis not present

## 2022-07-24 DIAGNOSIS — I2510 Atherosclerotic heart disease of native coronary artery without angina pectoris: Secondary | ICD-10-CM | POA: Diagnosis not present

## 2022-07-24 DIAGNOSIS — I129 Hypertensive chronic kidney disease with stage 1 through stage 4 chronic kidney disease, or unspecified chronic kidney disease: Secondary | ICD-10-CM | POA: Diagnosis not present

## 2022-07-24 DIAGNOSIS — U071 COVID-19: Secondary | ICD-10-CM | POA: Diagnosis not present

## 2022-07-24 DIAGNOSIS — D6869 Other thrombophilia: Secondary | ICD-10-CM | POA: Diagnosis not present

## 2022-08-13 DIAGNOSIS — D68312 Antiphospholipid antibody with hemorrhagic disorder: Secondary | ICD-10-CM | POA: Diagnosis not present

## 2022-08-13 DIAGNOSIS — G40909 Epilepsy, unspecified, not intractable, without status epilepticus: Secondary | ICD-10-CM | POA: Diagnosis not present

## 2022-08-13 DIAGNOSIS — I700 Atherosclerosis of aorta: Secondary | ICD-10-CM | POA: Diagnosis not present

## 2022-08-13 DIAGNOSIS — G894 Chronic pain syndrome: Secondary | ICD-10-CM | POA: Diagnosis not present

## 2022-08-13 DIAGNOSIS — D693 Immune thrombocytopenic purpura: Secondary | ICD-10-CM | POA: Diagnosis not present

## 2022-08-13 DIAGNOSIS — I2510 Atherosclerotic heart disease of native coronary artery without angina pectoris: Secondary | ICD-10-CM | POA: Diagnosis not present

## 2022-08-13 DIAGNOSIS — M810 Age-related osteoporosis without current pathological fracture: Secondary | ICD-10-CM | POA: Diagnosis not present

## 2022-08-13 DIAGNOSIS — N183 Chronic kidney disease, stage 3 unspecified: Secondary | ICD-10-CM | POA: Diagnosis not present

## 2022-08-13 DIAGNOSIS — Z23 Encounter for immunization: Secondary | ICD-10-CM | POA: Diagnosis not present

## 2022-08-13 DIAGNOSIS — I69359 Hemiplegia and hemiparesis following cerebral infarction affecting unspecified side: Secondary | ICD-10-CM | POA: Diagnosis not present

## 2022-08-13 DIAGNOSIS — E785 Hyperlipidemia, unspecified: Secondary | ICD-10-CM | POA: Diagnosis not present

## 2022-08-13 DIAGNOSIS — I129 Hypertensive chronic kidney disease with stage 1 through stage 4 chronic kidney disease, or unspecified chronic kidney disease: Secondary | ICD-10-CM | POA: Diagnosis not present

## 2022-08-20 DIAGNOSIS — F419 Anxiety disorder, unspecified: Secondary | ICD-10-CM | POA: Diagnosis not present

## 2022-08-20 DIAGNOSIS — N183 Chronic kidney disease, stage 3 unspecified: Secondary | ICD-10-CM | POA: Diagnosis not present

## 2022-08-20 DIAGNOSIS — M810 Age-related osteoporosis without current pathological fracture: Secondary | ICD-10-CM | POA: Diagnosis not present

## 2022-08-20 DIAGNOSIS — Z792 Long term (current) use of antibiotics: Secondary | ICD-10-CM | POA: Diagnosis not present

## 2022-08-20 DIAGNOSIS — G894 Chronic pain syndrome: Secondary | ICD-10-CM | POA: Diagnosis not present

## 2022-08-20 DIAGNOSIS — E785 Hyperlipidemia, unspecified: Secondary | ICD-10-CM | POA: Diagnosis not present

## 2022-08-20 DIAGNOSIS — I2510 Atherosclerotic heart disease of native coronary artery without angina pectoris: Secondary | ICD-10-CM | POA: Diagnosis not present

## 2022-08-20 DIAGNOSIS — F330 Major depressive disorder, recurrent, mild: Secondary | ICD-10-CM | POA: Diagnosis not present

## 2022-08-20 DIAGNOSIS — Z7901 Long term (current) use of anticoagulants: Secondary | ICD-10-CM | POA: Diagnosis not present

## 2022-08-20 DIAGNOSIS — I129 Hypertensive chronic kidney disease with stage 1 through stage 4 chronic kidney disease, or unspecified chronic kidney disease: Secondary | ICD-10-CM | POA: Diagnosis not present

## 2022-08-20 DIAGNOSIS — Z7982 Long term (current) use of aspirin: Secondary | ICD-10-CM | POA: Diagnosis not present

## 2022-08-20 DIAGNOSIS — G40909 Epilepsy, unspecified, not intractable, without status epilepticus: Secondary | ICD-10-CM | POA: Diagnosis not present

## 2022-08-20 DIAGNOSIS — I700 Atherosclerosis of aorta: Secondary | ICD-10-CM | POA: Diagnosis not present

## 2022-08-20 DIAGNOSIS — I69354 Hemiplegia and hemiparesis following cerebral infarction affecting left non-dominant side: Secondary | ICD-10-CM | POA: Diagnosis not present

## 2022-10-16 DIAGNOSIS — S91212A Laceration without foreign body of left great toe with damage to nail, initial encounter: Secondary | ICD-10-CM | POA: Diagnosis not present

## 2022-10-16 DIAGNOSIS — I69359 Hemiplegia and hemiparesis following cerebral infarction affecting unspecified side: Secondary | ICD-10-CM | POA: Diagnosis not present

## 2022-10-16 DIAGNOSIS — L03032 Cellulitis of left toe: Secondary | ICD-10-CM | POA: Diagnosis not present

## 2022-10-19 DIAGNOSIS — M810 Age-related osteoporosis without current pathological fracture: Secondary | ICD-10-CM | POA: Diagnosis not present

## 2022-10-19 DIAGNOSIS — F419 Anxiety disorder, unspecified: Secondary | ICD-10-CM | POA: Diagnosis not present

## 2022-10-19 DIAGNOSIS — I69354 Hemiplegia and hemiparesis following cerebral infarction affecting left non-dominant side: Secondary | ICD-10-CM | POA: Diagnosis not present

## 2022-10-19 DIAGNOSIS — Z7901 Long term (current) use of anticoagulants: Secondary | ICD-10-CM | POA: Diagnosis not present

## 2022-10-19 DIAGNOSIS — G894 Chronic pain syndrome: Secondary | ICD-10-CM | POA: Diagnosis not present

## 2022-10-19 DIAGNOSIS — G40909 Epilepsy, unspecified, not intractable, without status epilepticus: Secondary | ICD-10-CM | POA: Diagnosis not present

## 2022-10-19 DIAGNOSIS — E785 Hyperlipidemia, unspecified: Secondary | ICD-10-CM | POA: Diagnosis not present

## 2022-10-19 DIAGNOSIS — I2510 Atherosclerotic heart disease of native coronary artery without angina pectoris: Secondary | ICD-10-CM | POA: Diagnosis not present

## 2022-10-19 DIAGNOSIS — Z7982 Long term (current) use of aspirin: Secondary | ICD-10-CM | POA: Diagnosis not present

## 2022-10-19 DIAGNOSIS — F330 Major depressive disorder, recurrent, mild: Secondary | ICD-10-CM | POA: Diagnosis not present

## 2022-10-19 DIAGNOSIS — N183 Chronic kidney disease, stage 3 unspecified: Secondary | ICD-10-CM | POA: Diagnosis not present

## 2022-10-19 DIAGNOSIS — I129 Hypertensive chronic kidney disease with stage 1 through stage 4 chronic kidney disease, or unspecified chronic kidney disease: Secondary | ICD-10-CM | POA: Diagnosis not present

## 2023-01-06 ENCOUNTER — Other Ambulatory Visit: Payer: Self-pay | Admitting: Hematology & Oncology

## 2023-01-06 DIAGNOSIS — D693 Immune thrombocytopenic purpura: Secondary | ICD-10-CM

## 2023-01-08 ENCOUNTER — Other Ambulatory Visit: Payer: Self-pay | Admitting: *Deleted

## 2023-01-08 DIAGNOSIS — D6862 Lupus anticoagulant syndrome: Secondary | ICD-10-CM

## 2023-01-08 DIAGNOSIS — D693 Immune thrombocytopenic purpura: Secondary | ICD-10-CM

## 2023-01-09 ENCOUNTER — Inpatient Hospital Stay: Payer: Medicare HMO

## 2023-01-09 ENCOUNTER — Inpatient Hospital Stay: Payer: Medicare HMO | Admitting: Hematology & Oncology

## 2023-01-13 ENCOUNTER — Other Ambulatory Visit: Payer: Medicare HMO

## 2023-01-13 ENCOUNTER — Ambulatory Visit: Payer: Medicare HMO | Admitting: Hematology & Oncology

## 2023-01-15 ENCOUNTER — Other Ambulatory Visit: Payer: Medicare HMO

## 2023-01-15 ENCOUNTER — Ambulatory Visit: Payer: Medicare HMO | Admitting: Medical Oncology

## 2023-01-16 ENCOUNTER — Encounter: Payer: Self-pay | Admitting: Hematology & Oncology

## 2023-01-16 ENCOUNTER — Inpatient Hospital Stay: Payer: Medicare HMO | Attending: Hematology & Oncology

## 2023-01-16 ENCOUNTER — Inpatient Hospital Stay: Payer: Medicare HMO | Admitting: Hematology & Oncology

## 2023-01-16 VITALS — BP 113/79 | HR 70 | Resp 18 | Wt 179.1 lb

## 2023-01-16 DIAGNOSIS — I639 Cerebral infarction, unspecified: Secondary | ICD-10-CM | POA: Diagnosis not present

## 2023-01-16 DIAGNOSIS — Z7901 Long term (current) use of anticoagulants: Secondary | ICD-10-CM | POA: Insufficient documentation

## 2023-01-16 DIAGNOSIS — D6862 Lupus anticoagulant syndrome: Secondary | ICD-10-CM | POA: Insufficient documentation

## 2023-01-16 DIAGNOSIS — R634 Abnormal weight loss: Secondary | ICD-10-CM | POA: Diagnosis not present

## 2023-01-16 DIAGNOSIS — D693 Immune thrombocytopenic purpura: Secondary | ICD-10-CM | POA: Insufficient documentation

## 2023-01-16 DIAGNOSIS — I69354 Hemiplegia and hemiparesis following cerebral infarction affecting left non-dominant side: Secondary | ICD-10-CM | POA: Diagnosis not present

## 2023-01-16 DIAGNOSIS — R531 Weakness: Secondary | ICD-10-CM | POA: Insufficient documentation

## 2023-01-16 LAB — CBC WITH DIFFERENTIAL (CANCER CENTER ONLY)
Abs Immature Granulocytes: 0.1 10*3/uL — ABNORMAL HIGH (ref 0.00–0.07)
Basophils Absolute: 0 10*3/uL (ref 0.0–0.1)
Basophils Relative: 0 %
Eosinophils Absolute: 0.5 10*3/uL (ref 0.0–0.5)
Eosinophils Relative: 4 %
HCT: 44.9 % (ref 39.0–52.0)
Hemoglobin: 14.3 g/dL (ref 13.0–17.0)
Immature Granulocytes: 1 %
Lymphocytes Relative: 19 %
Lymphs Abs: 2.1 10*3/uL (ref 0.7–4.0)
MCH: 28.8 pg (ref 26.0–34.0)
MCHC: 31.8 g/dL (ref 30.0–36.0)
MCV: 90.3 fL (ref 80.0–100.0)
Monocytes Absolute: 1.3 10*3/uL — ABNORMAL HIGH (ref 0.1–1.0)
Monocytes Relative: 12 %
Neutro Abs: 6.7 10*3/uL (ref 1.7–7.7)
Neutrophils Relative %: 64 %
Platelet Count: 319 10*3/uL (ref 150–400)
RBC: 4.97 MIL/uL (ref 4.22–5.81)
RDW: 15 % (ref 11.5–15.5)
WBC Count: 10.6 10*3/uL — ABNORMAL HIGH (ref 4.0–10.5)
nRBC: 0 % (ref 0.0–0.2)

## 2023-01-16 LAB — CMP (CANCER CENTER ONLY)
ALT: 38 U/L (ref 0–44)
AST: 18 U/L (ref 15–41)
Albumin: 4.4 g/dL (ref 3.5–5.0)
Alkaline Phosphatase: 71 U/L (ref 38–126)
Anion gap: 9 (ref 5–15)
BUN: 26 mg/dL — ABNORMAL HIGH (ref 8–23)
CO2: 26 mmol/L (ref 22–32)
Calcium: 9.5 mg/dL (ref 8.9–10.3)
Chloride: 105 mmol/L (ref 98–111)
Creatinine: 1.34 mg/dL — ABNORMAL HIGH (ref 0.61–1.24)
GFR, Estimated: 55 mL/min — ABNORMAL LOW (ref >60)
Glucose, Bld: 126 mg/dL — ABNORMAL HIGH (ref 70–99)
Potassium: 4.1 mmol/L (ref 3.5–5.1)
Sodium: 140 mmol/L (ref 135–145)
Total Bilirubin: 0.5 mg/dL (ref 0.3–1.2)
Total Protein: 7.4 g/dL (ref 6.5–8.1)

## 2023-01-16 LAB — SAVE SMEAR(SSMR), FOR PROVIDER SLIDE REVIEW

## 2023-01-16 LAB — LACTATE DEHYDROGENASE: LDH: 171 U/L (ref 98–192)

## 2023-01-16 NOTE — Progress Notes (Signed)
Hematology and Oncology Follow Up Visit  Mark [name] [id number] 25-Nov-1945 77 y.o. 01/16/2023   Principle Diagnosis:  1. ITP --occasionally relapsed-last relapse 01/06/2023. 2. 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 3. Positive lupus anticoagulant.   Current Therapy:        Xarelto 20 mg PO daily/ EC ASA 81 mg po q day   Interim History:  Mark [name] is here today with his wife for follow-up.  It has been quite a while since we last saw him.  Unfortunate, he may have had a relapse of the ITP.  Typically, when this happens, he has a lot of bruises.  This happened about a week or so ago.  He responded incredibly well to a short course of high-dose prednisone.  He was given 3 days of Decadron and 40 mg a day.  Today, his platelet count is 319,000.  Unfortunately, he did not have any blood work done when he had the bruises.  Currently, he is doing quite well.  He has lost weight.  His wife is little bit concerned about the weight loss.  Know that he is trying to increase his mobility.  Hopefully, he we will be able to do little bit more activity.  I think he may have a new therapist.  He has had no obvious bleeding.  There is been no headache.  His appetite has been good.  He has had no change in bowel or bladder habits.  He has had no cough or shortness of breath.  He has had no leg swelling.  Overall, I would say that his performance status is probably ECOG 2..   Medications:  Allergies as of 01/16/2023       Reactions   Diphenhydramine    Other reaction(s): affects kidney   Famotidine    Other reaction(s): affects kidneys        Medication List        Accurate as of January 16, 2023  4:39 PM. If you have any questions, ask your nurse or doctor.          acetaminophen 325 MG tablet Commonly known as: TYLENOL Take 650 mg by mouth every morning.   amLODipine 5 MG tablet Commonly known as: NORVASC Take 5 mg by mouth daily.   aspirin EC 81  MG tablet Take 81 mg by mouth daily. Swallow whole.   buPROPion 150 MG 24 hr tablet Commonly known as: WELLBUTRIN XL Take 150 mg by mouth daily.   Crestor 20 MG tablet Generic drug: rosuvastatin Take 20 mg by mouth at bedtime.   dexamethasone 4 MG tablet Commonly known as: DECADRON TAKE 10 TABLET BY MOUTH ONCE DAILY AS NEEDED FOR 3 DAYS FOR  ITP   fluticasone 50 MCG/ACT nasal spray Commonly known as: FLONASE Place 2 sprays into both nostrils daily as needed for allergies or rhinitis.   levETIRAcetam 500 MG tablet Commonly known as: KEPPRA Take 1,500 mg by mouth 2 (two) times daily.   Melatonin 10 MG Tabs Take 10 mg by mouth at bedtime.   omeprazole 40 MG capsule Commonly known as: PRILOSEC Take 40 mg by mouth every morning.   polyethylene glycol 17 g packet Commonly known as: MIRALAX / GLYCOLAX Take 17 g by mouth daily as needed. What changed: reasons to take this   sertraline 100 MG tablet Commonly known as: ZOLOFT Take 200 mg by mouth in the morning.   Vitamin D3 125 MCG (5000 UT) Tabs Take 5,000 Units by mouth every  other day.   Xarelto 20 MG Tabs tablet Generic drug: rivaroxaban Take 1 tablet by mouth once daily with breakfast What changed: how much to take        Allergies:  Allergies  Allergen Reactions   Diphenhydramine     Other reaction(s): affects kidney   Famotidine     Other reaction(s): affects kidneys    Past Medical History, Surgical history, Social history, and Family History were reviewed and updated.  Review of Systems: Review of Systems  Constitutional:  Positive for weight loss.  HENT: Negative.    Eyes: Negative.   Respiratory: Negative.    Cardiovascular: Negative.   Gastrointestinal: Negative.   Genitourinary: Negative.   Musculoskeletal: Negative.   Skin: Negative.   Neurological:  Positive for focal weakness.  Endo/Heme/Allergies:  Bruises/bleeds easily.  Psychiatric/Behavioral: Negative.       Physical Exam:   weight is 179 lb 1.9 oz (81.2 kg). His blood pressure is 113/79 and his pulse is 70. His respiration is 18 and oxygen saturation is 98%.   Wt Readings from Last 3 Encounters:  01/16/23 179 lb 1.9 oz (81.2 kg)  05/23/21 186 lb 12.8 oz (84.7 kg)  06/29/20 189 lb (85.7 kg)    Physical Exam Vitals reviewed.  HENT:     Head: Normocephalic and atraumatic.  Eyes:     Pupils: Pupils are equal, round, and reactive to light.  Cardiovascular:     Rate and Rhythm: Normal rate and regular rhythm.     Heart sounds: Normal heart sounds.  Pulmonary:     Effort: Pulmonary effort is normal.     Breath sounds: Normal breath sounds.  Abdominal:     General: Bowel sounds are normal.     Palpations: Abdomen is soft.  Musculoskeletal:        General: No tenderness or deformity. Normal range of motion.     Cervical back: Normal range of motion.  Lymphadenopathy:     Cervical: No cervical adenopathy.  Skin:    General: Skin is warm and dry.     Findings: No erythema or rash.  Neurological:     Mental Status: He is alert and oriented to person, place, and time.     Comments: Neurological exam shows a continued weakness over on the left side.  Psychiatric:        Behavior: Behavior normal.        Thought Content: Thought content normal.        Judgment: Judgment normal.      Lab Results  Component Value Date   WBC 10.6 (H) 01/16/2023   HGB 14.3 01/16/2023   HCT 44.9 01/16/2023   MCV 90.3 01/16/2023   PLT 319 01/16/2023   Lab Results  Component Value Date   FERRITIN 1,063 (H) 10/23/2016   IRON 15 (L) 06/30/2020   TIBC 196 (L) 06/30/2020   UIBC 181 06/30/2020   IRONPCTSAT 8 (L) 06/30/2020   Lab Results  Component Value Date   RETICCTPCT 1.5 08/20/2010   RBC 4.97 01/16/2023   RETICCTABS 75.3 08/20/2010   No results found for: "KPAFRELGTCHN", "LAMBDASER", "KAPLAMBRATIO" No results found for: "IGGSERUM", "IGA", "IGMSERUM" No results found for: "TOTALPROTELP", "ALBUMINELP", "A1GS",  "A2GS", "BETS", "BETA2SER", "GAMS", "MSPIKE", "SPEI"   Chemistry      Component Value Date/Time   NA 140 01/16/2023 1516   NA 146 (H) 04/09/2017 1405   NA 138 05/09/2016 1321   K 4.1 01/16/2023 1516   K 4.0 04/09/2017 1405  K 4.3 05/09/2016 1321   CL 105 01/16/2023 1516   CL 108 04/09/2017 1405   CO2 26 01/16/2023 1516   CO2 23 04/09/2017 1405   CO2 22 05/09/2016 1321   BUN 26 (H) 01/16/2023 1516   BUN 34 (H) 04/09/2017 1405   BUN 36.4 (H) 05/09/2016 1321   CREATININE 1.34 (H) 01/16/2023 1516   CREATININE 1.3 (H) 04/09/2017 1405   CREATININE 1.5 (H) 05/09/2016 1321      Component Value Date/Time   CALCIUM 9.5 01/16/2023 1516   CALCIUM 9.1 04/09/2017 1405   CALCIUM 9.9 05/09/2016 1321   ALKPHOS 71 01/16/2023 1516   ALKPHOS 61 04/09/2017 1405   ALKPHOS 103 05/09/2016 1321   AST 18 01/16/2023 1516   AST 39 (H) 05/09/2016 1321   ALT 38 01/16/2023 1516   ALT 57 (H) 04/09/2017 1405   ALT 64 (H) 05/09/2016 1321   BILITOT 0.5 01/16/2023 1516   BILITOT 0.49 05/09/2016 1321       Impression and Plan: Mark [name] is a very pleasant 77 yo caucasian gentleman with ITP.  Again, he has occasional relapses.  Sounds like he may have had a relapse last week.  Again, he responds very nicely to the steroids.  He gets high-dose Decadron for 3 days.  He continues on the Xarelto and aspirin.  This is a very good protocol for him.  At this point, we will just plan to get him back in another 6 months.  I told him that if he does have another episode of these bruises, to please let us know so we get him in so we see what his platelet count is.    Josph Macho, MD 10/3/20244:39 PM

## 2023-01-19 LAB — DRVVT MIX: dRVVT Mix: 126 s — ABNORMAL HIGH (ref 0.0–40.4)

## 2023-01-19 LAB — PTT-LA MIX: PTT-LA Mix: 73.5 s — ABNORMAL HIGH (ref 0.0–40.5)

## 2023-01-19 LAB — LUPUS ANTICOAGULANT PANEL
DRVVT: 222.5 s — ABNORMAL HIGH (ref 0.0–47.0)
PTT Lupus Anticoagulant: 101.5 s — ABNORMAL HIGH (ref 0.0–43.5)

## 2023-01-19 LAB — DRVVT CONFIRM: dRVVT Confirm: 2.8 {ratio} — ABNORMAL HIGH (ref 0.8–1.2)

## 2023-01-19 LAB — HEXAGONAL PHASE PHOSPHOLIPID: Hexagonal Phase Phospholipid: 37 s — ABNORMAL HIGH (ref 0–11)

## 2023-01-20 LAB — PTT FACTOR INHIBITOR (MIXING STUDY)
aPTT 1:1 NP Incub. Mix Ctl: 37.5 s — ABNORMAL HIGH (ref 22.9–30.2)
aPTT 1:1 NP Mix, 60 Min,Incub.: 38.2 s — ABNORMAL HIGH (ref 22.9–30.2)
aPTT 1:1 Normal Plasma: 34.7 s — ABNORMAL HIGH (ref 22.9–30.2)
aPTT: 47.1 s — ABNORMAL HIGH (ref 22.9–30.2)

## 2023-01-24 LAB — HEXAGONAL PHOSPHOLIPID NEUTRALIZATION: Hexagonal Phospholipid Neutral: 56 s — ABNORMAL HIGH

## 2023-02-07 DIAGNOSIS — R339 Retention of urine, unspecified: Secondary | ICD-10-CM | POA: Diagnosis not present

## 2023-02-17 DIAGNOSIS — M810 Age-related osteoporosis without current pathological fracture: Secondary | ICD-10-CM | POA: Diagnosis not present

## 2023-02-17 DIAGNOSIS — N183 Chronic kidney disease, stage 3 unspecified: Secondary | ICD-10-CM | POA: Diagnosis not present

## 2023-02-17 DIAGNOSIS — D693 Immune thrombocytopenic purpura: Secondary | ICD-10-CM | POA: Diagnosis not present

## 2023-02-17 DIAGNOSIS — G894 Chronic pain syndrome: Secondary | ICD-10-CM | POA: Diagnosis not present

## 2023-02-17 DIAGNOSIS — F330 Major depressive disorder, recurrent, mild: Secondary | ICD-10-CM | POA: Diagnosis not present

## 2023-02-17 DIAGNOSIS — R413 Other amnesia: Secondary | ICD-10-CM | POA: Diagnosis not present

## 2023-02-17 DIAGNOSIS — E785 Hyperlipidemia, unspecified: Secondary | ICD-10-CM | POA: Diagnosis not present

## 2023-02-17 DIAGNOSIS — G40909 Epilepsy, unspecified, not intractable, without status epilepticus: Secondary | ICD-10-CM | POA: Diagnosis not present

## 2023-02-17 DIAGNOSIS — I69354 Hemiplegia and hemiparesis following cerebral infarction affecting left non-dominant side: Secondary | ICD-10-CM | POA: Diagnosis not present

## 2023-02-17 DIAGNOSIS — I129 Hypertensive chronic kidney disease with stage 1 through stage 4 chronic kidney disease, or unspecified chronic kidney disease: Secondary | ICD-10-CM | POA: Diagnosis not present

## 2023-02-17 DIAGNOSIS — R296 Repeated falls: Secondary | ICD-10-CM | POA: Diagnosis not present

## 2023-02-20 ENCOUNTER — Inpatient Hospital Stay: Payer: Medicare HMO | Attending: Hematology & Oncology

## 2023-02-20 ENCOUNTER — Telehealth: Payer: Self-pay

## 2023-02-20 ENCOUNTER — Other Ambulatory Visit: Payer: Self-pay

## 2023-02-20 DIAGNOSIS — Z7901 Long term (current) use of anticoagulants: Secondary | ICD-10-CM | POA: Insufficient documentation

## 2023-02-20 DIAGNOSIS — I69354 Hemiplegia and hemiparesis following cerebral infarction affecting left non-dominant side: Secondary | ICD-10-CM | POA: Diagnosis not present

## 2023-02-20 DIAGNOSIS — D693 Immune thrombocytopenic purpura: Secondary | ICD-10-CM

## 2023-02-20 DIAGNOSIS — R531 Weakness: Secondary | ICD-10-CM | POA: Diagnosis not present

## 2023-02-20 DIAGNOSIS — R634 Abnormal weight loss: Secondary | ICD-10-CM | POA: Insufficient documentation

## 2023-02-20 DIAGNOSIS — D6862 Lupus anticoagulant syndrome: Secondary | ICD-10-CM | POA: Diagnosis not present

## 2023-02-20 DIAGNOSIS — Z7952 Long term (current) use of systemic steroids: Secondary | ICD-10-CM | POA: Diagnosis not present

## 2023-02-20 DIAGNOSIS — Z7982 Long term (current) use of aspirin: Secondary | ICD-10-CM | POA: Diagnosis not present

## 2023-02-20 LAB — CBC WITH DIFFERENTIAL (CANCER CENTER ONLY)
Abs Immature Granulocytes: 0.07 10*3/uL (ref 0.00–0.07)
Basophils Absolute: 0.1 10*3/uL (ref 0.0–0.1)
Basophils Relative: 0 %
Eosinophils Absolute: 0.8 10*3/uL — ABNORMAL HIGH (ref 0.0–0.5)
Eosinophils Relative: 6 %
HCT: 37.5 % — ABNORMAL LOW (ref 39.0–52.0)
Hemoglobin: 12.3 g/dL — ABNORMAL LOW (ref 13.0–17.0)
Immature Granulocytes: 1 %
Lymphocytes Relative: 20 %
Lymphs Abs: 2.6 10*3/uL (ref 0.7–4.0)
MCH: 29.5 pg (ref 26.0–34.0)
MCHC: 32.8 g/dL (ref 30.0–36.0)
MCV: 89.9 fL (ref 80.0–100.0)
Monocytes Absolute: 2 10*3/uL — ABNORMAL HIGH (ref 0.1–1.0)
Monocytes Relative: 16 %
Neutro Abs: 7.3 10*3/uL (ref 1.7–7.7)
Neutrophils Relative %: 57 %
Platelet Count: 30 10*3/uL — ABNORMAL LOW (ref 150–400)
RBC: 4.17 MIL/uL — ABNORMAL LOW (ref 4.22–5.81)
RDW: 16 % — ABNORMAL HIGH (ref 11.5–15.5)
WBC Count: 12.8 10*3/uL — ABNORMAL HIGH (ref 4.0–10.5)
nRBC: 0 % (ref 0.0–0.2)

## 2023-02-20 MED ORDER — DEXAMETHASONE 4 MG PO TABS: ORAL_TABLET | ORAL | 0 refills | Status: DC

## 2023-02-20 NOTE — Telephone Encounter (Signed)
Patient plt count 30, MD notified and informed to restart his decadron-40 mg for 4 days and recheck labs next week. Patient and wife informed in lobby. Scheduled made for lab recheck next week.

## 2023-02-20 NOTE — Telephone Encounter (Signed)
Call from patients wife. States patient fell 11/5 and yesterday started having blood blisters and bruising. He is seeing his PCP for cognitive issues which led to the fall. She will bring patient in for lab check this afternoon to check his plt count.

## 2023-02-21 DIAGNOSIS — R413 Other amnesia: Secondary | ICD-10-CM | POA: Diagnosis not present

## 2023-02-24 DIAGNOSIS — D693 Immune thrombocytopenic purpura: Secondary | ICD-10-CM | POA: Diagnosis not present

## 2023-02-24 DIAGNOSIS — D68312 Antiphospholipid antibody with hemorrhagic disorder: Secondary | ICD-10-CM | POA: Diagnosis not present

## 2023-02-24 DIAGNOSIS — D6941 Evans syndrome: Secondary | ICD-10-CM | POA: Diagnosis not present

## 2023-02-24 DIAGNOSIS — I129 Hypertensive chronic kidney disease with stage 1 through stage 4 chronic kidney disease, or unspecified chronic kidney disease: Secondary | ICD-10-CM | POA: Diagnosis not present

## 2023-02-24 DIAGNOSIS — B1910 Unspecified viral hepatitis B without hepatic coma: Secondary | ICD-10-CM | POA: Diagnosis not present

## 2023-02-24 DIAGNOSIS — M810 Age-related osteoporosis without current pathological fracture: Secondary | ICD-10-CM | POA: Diagnosis not present

## 2023-02-24 DIAGNOSIS — I69354 Hemiplegia and hemiparesis following cerebral infarction affecting left non-dominant side: Secondary | ICD-10-CM | POA: Diagnosis not present

## 2023-02-24 DIAGNOSIS — I2510 Atherosclerotic heart disease of native coronary artery without angina pectoris: Secondary | ICD-10-CM | POA: Diagnosis not present

## 2023-02-24 DIAGNOSIS — N183 Chronic kidney disease, stage 3 unspecified: Secondary | ICD-10-CM | POA: Diagnosis not present

## 2023-02-24 DIAGNOSIS — I700 Atherosclerosis of aorta: Secondary | ICD-10-CM | POA: Diagnosis not present

## 2023-02-24 DIAGNOSIS — G40909 Epilepsy, unspecified, not intractable, without status epilepticus: Secondary | ICD-10-CM | POA: Diagnosis not present

## 2023-02-24 DIAGNOSIS — F330 Major depressive disorder, recurrent, mild: Secondary | ICD-10-CM | POA: Diagnosis not present

## 2023-02-26 ENCOUNTER — Inpatient Hospital Stay: Payer: Medicare HMO

## 2023-02-26 ENCOUNTER — Ambulatory Visit: Payer: Medicare HMO | Admitting: Hematology & Oncology

## 2023-02-28 DIAGNOSIS — N312 Flaccid neuropathic bladder, not elsewhere classified: Secondary | ICD-10-CM | POA: Diagnosis not present

## 2023-03-03 ENCOUNTER — Inpatient Hospital Stay: Payer: Medicare HMO

## 2023-03-03 ENCOUNTER — Inpatient Hospital Stay: Payer: Medicare HMO | Admitting: Medical Oncology

## 2023-03-03 ENCOUNTER — Encounter: Payer: Self-pay | Admitting: Medical Oncology

## 2023-03-03 ENCOUNTER — Other Ambulatory Visit: Payer: Self-pay

## 2023-03-03 VITALS — BP 99/61 | HR 76 | Temp 98.2°F | Resp 19 | Ht 72.0 in

## 2023-03-03 DIAGNOSIS — I639 Cerebral infarction, unspecified: Secondary | ICD-10-CM

## 2023-03-03 DIAGNOSIS — R531 Weakness: Secondary | ICD-10-CM | POA: Diagnosis not present

## 2023-03-03 DIAGNOSIS — D693 Immune thrombocytopenic purpura: Secondary | ICD-10-CM

## 2023-03-03 DIAGNOSIS — D6862 Lupus anticoagulant syndrome: Secondary | ICD-10-CM | POA: Diagnosis not present

## 2023-03-03 DIAGNOSIS — I69354 Hemiplegia and hemiparesis following cerebral infarction affecting left non-dominant side: Secondary | ICD-10-CM | POA: Diagnosis not present

## 2023-03-03 DIAGNOSIS — Z7982 Long term (current) use of aspirin: Secondary | ICD-10-CM | POA: Diagnosis not present

## 2023-03-03 DIAGNOSIS — Z7901 Long term (current) use of anticoagulants: Secondary | ICD-10-CM | POA: Diagnosis not present

## 2023-03-03 DIAGNOSIS — R634 Abnormal weight loss: Secondary | ICD-10-CM | POA: Diagnosis not present

## 2023-03-03 DIAGNOSIS — Z7952 Long term (current) use of systemic steroids: Secondary | ICD-10-CM | POA: Diagnosis not present

## 2023-03-03 LAB — CMP (CANCER CENTER ONLY)
ALT: 30 U/L (ref 0–44)
AST: 20 U/L (ref 15–41)
Albumin: 4.6 g/dL (ref 3.5–5.0)
Alkaline Phosphatase: 79 U/L (ref 38–126)
Anion gap: 10 (ref 5–15)
BUN: 33 mg/dL — ABNORMAL HIGH (ref 8–23)
CO2: 26 mmol/L (ref 22–32)
Calcium: 9.9 mg/dL (ref 8.9–10.3)
Chloride: 105 mmol/L (ref 98–111)
Creatinine: 1.61 mg/dL — ABNORMAL HIGH (ref 0.61–1.24)
GFR, Estimated: 44 mL/min — ABNORMAL LOW (ref >60)
Glucose, Bld: 112 mg/dL — ABNORMAL HIGH (ref 70–99)
Potassium: 4.3 mmol/L (ref 3.5–5.1)
Sodium: 141 mmol/L (ref 135–145)
Total Bilirubin: 0.5 mg/dL (ref <1.2)
Total Protein: 7.3 g/dL (ref 6.5–8.1)

## 2023-03-03 LAB — CBC WITH DIFFERENTIAL (CANCER CENTER ONLY)
Abs Immature Granulocytes: 0.45 10*3/uL — ABNORMAL HIGH (ref 0.00–0.07)
Basophils Absolute: 0.1 10*3/uL (ref 0.0–0.1)
Basophils Relative: 0 %
Eosinophils Absolute: 0.6 10*3/uL — ABNORMAL HIGH (ref 0.0–0.5)
Eosinophils Relative: 4 %
HCT: 39.5 % (ref 39.0–52.0)
Hemoglobin: 12.8 g/dL — ABNORMAL LOW (ref 13.0–17.0)
Immature Granulocytes: 3 %
Lymphocytes Relative: 18 %
Lymphs Abs: 2.7 10*3/uL (ref 0.7–4.0)
MCH: 29.9 pg (ref 26.0–34.0)
MCHC: 32.4 g/dL (ref 30.0–36.0)
MCV: 92.3 fL (ref 80.0–100.0)
Monocytes Absolute: 2 10*3/uL — ABNORMAL HIGH (ref 0.1–1.0)
Monocytes Relative: 14 %
Neutro Abs: 8.8 10*3/uL — ABNORMAL HIGH (ref 1.7–7.7)
Neutrophils Relative %: 61 %
Platelet Count: 408 10*3/uL — ABNORMAL HIGH (ref 150–400)
RBC: 4.28 MIL/uL (ref 4.22–5.81)
RDW: 17.5 % — ABNORMAL HIGH (ref 11.5–15.5)
WBC Count: 14.7 10*3/uL — ABNORMAL HIGH (ref 4.0–10.5)
nRBC: 0.2 % (ref 0.0–0.2)

## 2023-03-03 LAB — SAVE SMEAR(SSMR), FOR PROVIDER SLIDE REVIEW

## 2023-03-03 NOTE — Progress Notes (Signed)
Hematology and Oncology Follow Up Visit  Mark [name] CURRENT [id number] 1945-11-18 77 y.o. 03/03/2023   Principle Diagnosis:  1. ITP --occasionally relapsed-last relapse 01/06/2023. 2. 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 3. Positive lupus anticoagulant.   Current Therapy:        Xarelto 20 mg PO daily/ EC ASA 81 mg po q day   Interim History:  Mark [name] is here today with his wife for follow-up. At his last visit with Dr. Myna Hidalgo his ITP appeared to be back. He was treated with 3 days worth of high dose decadron. Then on 02/20/2023 he started having blood blisters and bruising. CBC showed a Hgb of 12.3 and platelet count of 30. He was again treated with high dose decadron x 3 days. He is also on Xarelto and ASA given his history of CVA and elevated clotting risk.   He is here for follow up.   They report that he has had no new bruises or any bleeding. He tolerated his decadron well. He does have a UTI which was diagnosed around the same time as the most recent ITP episode- likely related. He is feeling much better overall.   He has had no obvious bleeding or bruising since his last ITP event. There is been no headache.  His appetite has been good.  He has had no change in bowel habits. He has had no cough or shortness of breath.  He has had no leg swelling.  Overall, I would say that his performance status is probably ECOG 2.  Medications:  Allergies as of 03/03/2023       Reactions   Diphenhydramine Other (See Comments)   Other reaction(s): affects kidney   Famotidine Other (See Comments)   Other reaction(s): affects kidneys        Medication List        Accurate as of March 03, 2023  4:08 PM. If you have any questions, ask your nurse or doctor.          STOP taking these medications    Melatonin 10 MG Tabs Stopped by: Rushie Chestnut       TAKE these medications    acetaminophen 325 MG tablet Commonly known as: TYLENOL Take  650 mg by mouth every morning.   amLODipine 10 MG tablet Commonly known as: NORVASC Take 10 mg by mouth daily. What changed: Another medication with the same name was removed. Continue taking this medication, and follow the directions you see here. Changed by: Rushie Chestnut   aspirin EC 81 MG tablet Take 81 mg by mouth daily. Swallow whole.   BACLOFEN PO Take 10 mg by mouth 2 (two) times daily.   buPROPion 150 MG 24 hr tablet Commonly known as: WELLBUTRIN XL Take 150 mg by mouth daily.   Crestor 20 MG tablet Generic drug: rosuvastatin Take 20 mg by mouth at bedtime.   D2000 Ultra Strength 50 MCG (2000 UT) Caps Generic drug: Cholecalciferol Take 1 capsule by mouth 2 (two) times daily. What changed: Another medication with the same name was removed. Continue taking this medication, and follow the directions you see here. Changed by: Rushie Chestnut   dexamethasone 4 MG tablet Commonly known as: DECADRON TAKE 10 TABLET BY MOUTH ONCE DAILY AS NEEDED FOR 3 DAYS FOR  ITP   fluticasone 50 MCG/ACT nasal spray Commonly known as: FLONASE Place 2 sprays into both nostrils daily as needed for allergies or rhinitis.   levETIRAcetam 500 MG tablet Commonly  known as: KEPPRA Take 1,500 mg by mouth 2 (two) times daily.   omeprazole 40 MG capsule Commonly known as: PRILOSEC Take 40 mg by mouth every morning.   penicillin v potassium 500 MG tablet Commonly known as: VEETID Take 500 mg by mouth 3 (three) times daily.   polyethylene glycol 17 g packet Commonly known as: MIRALAX / GLYCOLAX Take 17 g by mouth daily as needed. What changed: reasons to take this   sertraline 100 MG tablet Commonly known as: ZOLOFT Take 200 mg by mouth in the morning.   Xarelto 20 MG Tabs tablet Generic drug: rivaroxaban Take 1 tablet by mouth once daily with breakfast What changed: how much to take        Allergies:  Allergies  Allergen Reactions   Diphenhydramine Other (See Comments)     Other reaction(s): affects kidney   Famotidine Other (See Comments)    Other reaction(s): affects kidneys    Past Medical History, Surgical history, Social history, and Family History were reviewed and updated.  Review of Systems: Review of Systems  Constitutional:  Positive for weight loss.  HENT: Negative.    Eyes: Negative.   Respiratory: Negative.    Cardiovascular: Negative.   Gastrointestinal: Negative.   Genitourinary: Negative.   Musculoskeletal: Negative.   Skin: Negative.   Neurological:  Positive for focal weakness.  Endo/Heme/Allergies:  Does not bruise/bleed easily.  Psychiatric/Behavioral: Negative.       Physical Exam:  height is 6' (1.829 m). His oral temperature is 98.2 F (36.8 C). His blood pressure is 99/61 and his pulse is 76. His respiration is 19 and oxygen saturation is 96%.   Wt Readings from Last 3 Encounters:  01/16/23 179 lb 1.9 oz (81.2 kg)  05/23/21 186 lb 12.8 oz (84.7 kg)  06/29/20 189 lb (85.7 kg)    Physical Exam Vitals reviewed.  Constitutional:      Comments: Sitting comfortably in wheelchair  HENT:     Head: Normocephalic and atraumatic.  Eyes:     Pupils: Pupils are equal, round, and reactive to light.  Cardiovascular:     Rate and Rhythm: Normal rate and regular rhythm.     Heart sounds: Normal heart sounds.  Pulmonary:     Effort: Pulmonary effort is normal.     Breath sounds: Normal breath sounds.  Abdominal:     General: Bowel sounds are normal.     Palpations: Abdomen is soft.  Musculoskeletal:        General: No tenderness or deformity. Normal range of motion.     Cervical back: Normal range of motion.  Lymphadenopathy:     Cervical: No cervical adenopathy.  Skin:    General: Skin is warm and dry.     Findings: Bruising (Resolving scattered moderately sized areas of ecchymosis  of the arms) present. No erythema or rash.  Neurological:     Mental Status: He is alert and oriented to person, place, and time.      Comments: Neurological exam shows a continued weakness over on the left side.  Psychiatric:        Behavior: Behavior normal.        Thought Content: Thought content normal.        Judgment: Judgment normal.      Lab Results  Component Value Date   WBC 14.7 (H) 03/03/2023   HGB 12.8 (L) 03/03/2023   HCT 39.5 03/03/2023   MCV 92.3 03/03/2023   PLT 408 (H) 03/03/2023  Lab Results  Component Value Date   FERRITIN 1,063 (H) 10/23/2016   IRON 15 (L) 06/30/2020   TIBC 196 (L) 06/30/2020   UIBC 181 06/30/2020   IRONPCTSAT 8 (L) 06/30/2020   Lab Results  Component Value Date   RETICCTPCT 1.5 08/20/2010   RBC 4.28 03/03/2023   RETICCTABS 75.3 08/20/2010   No results found for: "KPAFRELGTCHN", "LAMBDASER", "KAPLAMBRATIO" No results found for: "IGGSERUM", "IGA", "IGMSERUM" No results found for: "TOTALPROTELP", "ALBUMINELP", "A1GS", "A2GS", "BETS", "BETA2SER", "GAMS", "MSPIKE", "SPEI"   Chemistry      Component Value Date/Time   NA 141 03/03/2023 1450   NA 146 (H) 04/09/2017 1405   NA 138 05/09/2016 1321   K 4.3 03/03/2023 1450   K 4.0 04/09/2017 1405   K 4.3 05/09/2016 1321   CL 105 03/03/2023 1450   CL 108 04/09/2017 1405   CO2 26 03/03/2023 1450   CO2 23 04/09/2017 1405   CO2 22 05/09/2016 1321   BUN 33 (H) 03/03/2023 1450   BUN 34 (H) 04/09/2017 1405   BUN 36.4 (H) 05/09/2016 1321   CREATININE 1.61 (H) 03/03/2023 1450   CREATININE 1.3 (H) 04/09/2017 1405   CREATININE 1.5 (H) 05/09/2016 1321      Component Value Date/Time   CALCIUM 9.9 03/03/2023 1450   CALCIUM 9.1 04/09/2017 1405   CALCIUM 9.9 05/09/2016 1321   ALKPHOS 79 03/03/2023 1450   ALKPHOS 61 04/09/2017 1405   ALKPHOS 103 05/09/2016 1321   AST 20 03/03/2023 1450   AST 39 (H) 05/09/2016 1321   ALT 30 03/03/2023 1450   ALT 57 (H) 04/09/2017 1405   ALT 64 (H) 05/09/2016 1321   BILITOT 0.5 03/03/2023 1450   BILITOT 0.49 05/09/2016 1321     Encounter Diagnoses  Name Primary?   Idiopathic  thrombocytopenic purpura (HCC) Yes   Lupus anticoagulant disorder (HCC)      Impression and Plan: Mark [name] is a very pleasant 77 yo caucasian gentleman with ITP.  He has occasional relapses. He gets high-dose Decadron for 3 days which works well for him.  Today platelet count is back up. No new bruising or bleeding.   He continues on the Xarelto and aspirin  At this point, we will just plan to get him back in another 6 months or sooner as needed per patient preference. Fortunately they recognize his ITP events very quickly. We did discuss that should he get another infection we should check labs.   RTC 6 months Dr. Loraine Maple, labs (CBC, save smear, CMP)-[place]  Rushie Chestnut, PA-C 11/18/20244:08 PM

## 2023-04-11 DIAGNOSIS — D693 Immune thrombocytopenic purpura: Secondary | ICD-10-CM | POA: Diagnosis not present

## 2023-04-11 DIAGNOSIS — I129 Hypertensive chronic kidney disease with stage 1 through stage 4 chronic kidney disease, or unspecified chronic kidney disease: Secondary | ICD-10-CM | POA: Diagnosis not present

## 2023-04-11 DIAGNOSIS — D68312 Antiphospholipid antibody with hemorrhagic disorder: Secondary | ICD-10-CM | POA: Diagnosis not present

## 2023-04-11 DIAGNOSIS — E785 Hyperlipidemia, unspecified: Secondary | ICD-10-CM | POA: Diagnosis not present

## 2023-04-11 DIAGNOSIS — I252 Old myocardial infarction: Secondary | ICD-10-CM | POA: Diagnosis not present

## 2023-04-11 DIAGNOSIS — G40909 Epilepsy, unspecified, not intractable, without status epilepticus: Secondary | ICD-10-CM | POA: Diagnosis not present

## 2023-04-11 DIAGNOSIS — I69359 Hemiplegia and hemiparesis following cerebral infarction affecting unspecified side: Secondary | ICD-10-CM | POA: Diagnosis not present

## 2023-04-11 DIAGNOSIS — M810 Age-related osteoporosis without current pathological fracture: Secondary | ICD-10-CM | POA: Diagnosis not present

## 2023-04-11 DIAGNOSIS — I700 Atherosclerosis of aorta: Secondary | ICD-10-CM | POA: Diagnosis not present

## 2023-04-11 DIAGNOSIS — G894 Chronic pain syndrome: Secondary | ICD-10-CM | POA: Diagnosis not present

## 2023-04-11 DIAGNOSIS — I2510 Atherosclerotic heart disease of native coronary artery without angina pectoris: Secondary | ICD-10-CM | POA: Diagnosis not present

## 2023-04-11 DIAGNOSIS — Z0000 Encounter for general adult medical examination without abnormal findings: Secondary | ICD-10-CM | POA: Diagnosis not present

## 2023-04-25 DIAGNOSIS — D68312 Antiphospholipid antibody with hemorrhagic disorder: Secondary | ICD-10-CM | POA: Diagnosis not present

## 2023-04-25 DIAGNOSIS — I700 Atherosclerosis of aorta: Secondary | ICD-10-CM | POA: Diagnosis not present

## 2023-04-25 DIAGNOSIS — N183 Chronic kidney disease, stage 3 unspecified: Secondary | ICD-10-CM | POA: Diagnosis not present

## 2023-04-25 DIAGNOSIS — F330 Major depressive disorder, recurrent, mild: Secondary | ICD-10-CM | POA: Diagnosis not present

## 2023-04-25 DIAGNOSIS — I2510 Atherosclerotic heart disease of native coronary artery without angina pectoris: Secondary | ICD-10-CM | POA: Diagnosis not present

## 2023-04-25 DIAGNOSIS — G40909 Epilepsy, unspecified, not intractable, without status epilepticus: Secondary | ICD-10-CM | POA: Diagnosis not present

## 2023-04-25 DIAGNOSIS — I129 Hypertensive chronic kidney disease with stage 1 through stage 4 chronic kidney disease, or unspecified chronic kidney disease: Secondary | ICD-10-CM | POA: Diagnosis not present

## 2023-04-25 DIAGNOSIS — E785 Hyperlipidemia, unspecified: Secondary | ICD-10-CM | POA: Diagnosis not present

## 2023-04-25 DIAGNOSIS — D6941 Evans syndrome: Secondary | ICD-10-CM | POA: Diagnosis not present

## 2023-04-25 DIAGNOSIS — M810 Age-related osteoporosis without current pathological fracture: Secondary | ICD-10-CM | POA: Diagnosis not present

## 2023-04-25 DIAGNOSIS — I69354 Hemiplegia and hemiparesis following cerebral infarction affecting left non-dominant side: Secondary | ICD-10-CM | POA: Diagnosis not present

## 2023-04-25 DIAGNOSIS — D693 Immune thrombocytopenic purpura: Secondary | ICD-10-CM | POA: Diagnosis not present

## 2023-05-06 DIAGNOSIS — M21372 Foot drop, left foot: Secondary | ICD-10-CM | POA: Diagnosis not present

## 2023-05-06 DIAGNOSIS — I69359 Hemiplegia and hemiparesis following cerebral infarction affecting unspecified side: Secondary | ICD-10-CM | POA: Diagnosis not present

## 2023-05-06 DIAGNOSIS — G894 Chronic pain syndrome: Secondary | ICD-10-CM | POA: Diagnosis not present

## 2023-05-21 ENCOUNTER — Telehealth: Payer: Self-pay

## 2023-05-21 ENCOUNTER — Other Ambulatory Visit: Payer: Self-pay

## 2023-05-21 DIAGNOSIS — D693 Immune thrombocytopenic purpura: Secondary | ICD-10-CM

## 2023-05-21 MED ORDER — DEXAMETHASONE 4 MG PO TABS: ORAL_TABLET | ORAL | 0 refills | Status: DC

## 2023-05-21 NOTE — Telephone Encounter (Signed)
 Received call from pt's wife [name] stating that pt is covered in petechiae. Spouse reports she gave him the high dose decadron  as instructed x 3 days from Feb 1st-3rd. [name] states the petechiae resolved by end of day 3rd and was gone on the 4th but was back when he woke up today. Questions what Dr Timmy would like for her to do now. She mentioned to remind Dr Timmy how difficult it is to bring patient into office.   Per Dr Timmy, give 3 more days of high dose decadron . [name] verbalizes understanding using teachback to administer 10 tablets of 4mg  decadron  today, tomorrow and Friday.

## 2023-06-04 DIAGNOSIS — B029 Zoster without complications: Secondary | ICD-10-CM | POA: Diagnosis not present

## 2023-06-04 DIAGNOSIS — D693 Immune thrombocytopenic purpura: Secondary | ICD-10-CM | POA: Diagnosis not present

## 2023-06-04 DIAGNOSIS — R040 Epistaxis: Secondary | ICD-10-CM | POA: Diagnosis not present

## 2023-06-06 DIAGNOSIS — M21372 Foot drop, left foot: Secondary | ICD-10-CM | POA: Diagnosis not present

## 2023-06-06 DIAGNOSIS — I69359 Hemiplegia and hemiparesis following cerebral infarction affecting unspecified side: Secondary | ICD-10-CM | POA: Diagnosis not present

## 2023-06-06 DIAGNOSIS — G894 Chronic pain syndrome: Secondary | ICD-10-CM | POA: Diagnosis not present

## 2023-06-09 ENCOUNTER — Encounter (HOSPITAL_COMMUNITY): Payer: Self-pay

## 2023-06-09 ENCOUNTER — Other Ambulatory Visit: Payer: Self-pay | Admitting: *Deleted

## 2023-06-09 ENCOUNTER — Telehealth: Payer: Self-pay | Admitting: *Deleted

## 2023-06-09 ENCOUNTER — Inpatient Hospital Stay: Payer: Medicare HMO

## 2023-06-09 ENCOUNTER — Encounter: Payer: Self-pay | Admitting: Medical Oncology

## 2023-06-09 ENCOUNTER — Inpatient Hospital Stay (HOSPITAL_BASED_OUTPATIENT_CLINIC_OR_DEPARTMENT_OTHER): Payer: Medicare HMO | Admitting: Medical Oncology

## 2023-06-09 ENCOUNTER — Telehealth: Payer: Self-pay

## 2023-06-09 ENCOUNTER — Inpatient Hospital Stay (HOSPITAL_COMMUNITY)
Admission: EM | Admit: 2023-06-09 | Discharge: 2023-06-13 | DRG: 813 | Disposition: A | Discharge status: Home Health | Payer: Medicare HMO | Source: Ambulatory Visit | Attending: Internal Medicine | Admitting: Internal Medicine

## 2023-06-09 ENCOUNTER — Other Ambulatory Visit: Payer: Self-pay

## 2023-06-09 VITALS — BP 132/87 | HR 86 | Temp 98.9°F | Resp 20

## 2023-06-09 DIAGNOSIS — Z9861 Coronary angioplasty status: Secondary | ICD-10-CM

## 2023-06-09 DIAGNOSIS — D6959 Other secondary thrombocytopenia: Secondary | ICD-10-CM | POA: Diagnosis not present

## 2023-06-09 DIAGNOSIS — D693 Immune thrombocytopenic purpura: Secondary | ICD-10-CM

## 2023-06-09 DIAGNOSIS — I639 Cerebral infarction, unspecified: Secondary | ICD-10-CM

## 2023-06-09 DIAGNOSIS — Z79899 Other long term (current) drug therapy: Secondary | ICD-10-CM

## 2023-06-09 DIAGNOSIS — I69354 Hemiplegia and hemiparesis following cerebral infarction affecting left non-dominant side: Secondary | ICD-10-CM | POA: Diagnosis not present

## 2023-06-09 DIAGNOSIS — I129 Hypertensive chronic kidney disease with stage 1 through stage 4 chronic kidney disease, or unspecified chronic kidney disease: Secondary | ICD-10-CM | POA: Diagnosis present

## 2023-06-09 DIAGNOSIS — Z9081 Acquired absence of spleen: Secondary | ICD-10-CM

## 2023-06-09 DIAGNOSIS — Z87891 Personal history of nicotine dependence: Secondary | ICD-10-CM

## 2023-06-09 DIAGNOSIS — R1310 Dysphagia, unspecified: Secondary | ICD-10-CM | POA: Diagnosis not present

## 2023-06-09 DIAGNOSIS — N1831 Chronic kidney disease, stage 3a: Secondary | ICD-10-CM | POA: Diagnosis present

## 2023-06-09 DIAGNOSIS — R569 Unspecified convulsions: Secondary | ICD-10-CM | POA: Diagnosis present

## 2023-06-09 DIAGNOSIS — B029 Zoster without complications: Secondary | ICD-10-CM | POA: Diagnosis present

## 2023-06-09 DIAGNOSIS — Z66 Do not resuscitate: Secondary | ICD-10-CM | POA: Diagnosis not present

## 2023-06-09 DIAGNOSIS — Z7901 Long term (current) use of anticoagulants: Secondary | ICD-10-CM

## 2023-06-09 DIAGNOSIS — I2510 Atherosclerotic heart disease of native coronary artery without angina pectoris: Secondary | ICD-10-CM | POA: Diagnosis not present

## 2023-06-09 DIAGNOSIS — D6862 Lupus anticoagulant syndrome: Secondary | ICD-10-CM | POA: Diagnosis not present

## 2023-06-09 DIAGNOSIS — E785 Hyperlipidemia, unspecified: Secondary | ICD-10-CM | POA: Diagnosis present

## 2023-06-09 DIAGNOSIS — R040 Epistaxis: Secondary | ICD-10-CM | POA: Diagnosis present

## 2023-06-09 DIAGNOSIS — D696 Thrombocytopenia, unspecified: Secondary | ICD-10-CM | POA: Diagnosis not present

## 2023-06-09 DIAGNOSIS — K219 Gastro-esophageal reflux disease without esophagitis: Secondary | ICD-10-CM | POA: Diagnosis not present

## 2023-06-09 DIAGNOSIS — T380X5A Adverse effect of glucocorticoids and synthetic analogues, initial encounter: Secondary | ICD-10-CM | POA: Diagnosis not present

## 2023-06-09 DIAGNOSIS — F32A Depression, unspecified: Secondary | ICD-10-CM | POA: Diagnosis not present

## 2023-06-09 DIAGNOSIS — F419 Anxiety disorder, unspecified: Secondary | ICD-10-CM | POA: Diagnosis not present

## 2023-06-09 DIAGNOSIS — Z888 Allergy status to other drugs, medicaments and biological substances status: Secondary | ICD-10-CM

## 2023-06-09 DIAGNOSIS — K068 Other specified disorders of gingiva and edentulous alveolar ridge: Secondary | ICD-10-CM | POA: Diagnosis present

## 2023-06-09 DIAGNOSIS — Z7982 Long term (current) use of aspirin: Secondary | ICD-10-CM

## 2023-06-09 LAB — CBC WITH DIFFERENTIAL/PLATELET
Abs Immature Granulocytes: 0.17 10*3/uL — ABNORMAL HIGH (ref 0.00–0.07)
Basophils Absolute: 0.1 10*3/uL (ref 0.0–0.1)
Basophils Relative: 1 %
Eosinophils Absolute: 0.6 10*3/uL — ABNORMAL HIGH (ref 0.0–0.5)
Eosinophils Relative: 5 %
HCT: 40.7 % (ref 39.0–52.0)
Hemoglobin: 12.7 g/dL — ABNORMAL LOW (ref 13.0–17.0)
Immature Granulocytes: 2 %
Lymphocytes Relative: 32 %
Lymphs Abs: 3.6 10*3/uL (ref 0.7–4.0)
MCH: 28.8 pg (ref 26.0–34.0)
MCHC: 31.2 g/dL (ref 30.0–36.0)
MCV: 92.3 fL (ref 80.0–100.0)
Monocytes Absolute: 1.5 10*3/uL — ABNORMAL HIGH (ref 0.1–1.0)
Monocytes Relative: 13 %
Neutro Abs: 5.4 10*3/uL (ref 1.7–7.7)
Neutrophils Relative %: 47 %
Platelets: <5 10*3/uL — CL (ref 150–400)
RBC: 4.41 MIL/uL (ref 4.22–5.81)
RDW: 14.5 % (ref 11.5–15.5)
WBC: 11.3 10*3/uL — ABNORMAL HIGH (ref 4.0–10.5)
nRBC: 0.3 % — ABNORMAL HIGH (ref 0.0–0.2)

## 2023-06-09 LAB — COMPREHENSIVE METABOLIC PANEL
ALT: 22 U/L (ref 0–44)
ALT: 25 U/L (ref 0–44)
AST: 18 U/L (ref 15–41)
AST: 21 U/L (ref 15–41)
Albumin: 4.5 g/dL (ref 3.5–5.0)
Albumin: 3.7 g/dL (ref 3.5–5.0)
Alkaline Phosphatase: 80 U/L (ref 38–126)
Alkaline Phosphatase: 70 U/L (ref 38–126)
Anion gap: 9 (ref 5–15)
Anion gap: 8 (ref 5–15)
BUN: 24 mg/dL — ABNORMAL HIGH (ref 8–23)
BUN: 29 mg/dL — ABNORMAL HIGH (ref 8–23)
CO2: 26 mmol/L (ref 22–32)
CO2: 21 mmol/L — ABNORMAL LOW (ref 22–32)
Calcium: 9.8 mg/dL (ref 8.9–10.3)
Calcium: 9.1 mg/dL (ref 8.9–10.3)
Chloride: 106 mmol/L (ref 98–111)
Chloride: 108 mmol/L (ref 98–111)
Creatinine, Ser: 1.32 mg/dL — ABNORMAL HIGH (ref 0.61–1.24)
Creatinine, Ser: 1.31 mg/dL — ABNORMAL HIGH (ref 0.61–1.24)
GFR, Estimated: 56 mL/min — ABNORMAL LOW (ref >60)
GFR, Estimated: 56 mL/min — ABNORMAL LOW (ref >60)
Glucose, Bld: 102 mg/dL — ABNORMAL HIGH (ref 70–99)
Glucose, Bld: 97 mg/dL (ref 70–99)
Potassium: 4.1 mmol/L (ref 3.5–5.1)
Potassium: 4 mmol/L (ref 3.5–5.1)
Sodium: 141 mmol/L (ref 135–145)
Sodium: 137 mmol/L (ref 135–145)
Total Bilirubin: 0.4 mg/dL (ref 0.0–1.2)
Total Bilirubin: 0.4 mg/dL (ref 0.0–1.2)
Total Protein: 7 g/dL (ref 6.5–8.1)
Total Protein: 6.9 g/dL (ref 6.5–8.1)

## 2023-06-09 LAB — CBC WITH DIFFERENTIAL (CANCER CENTER ONLY)
Abs Immature Granulocytes: 0.06 10*3/uL (ref 0.00–0.07)
Basophils Absolute: 0.1 10*3/uL (ref 0.0–0.1)
Basophils Relative: 1 %
Eosinophils Absolute: 0.7 10*3/uL — ABNORMAL HIGH (ref 0.0–0.5)
Eosinophils Relative: 7 %
HCT: 41.6 % (ref 39.0–52.0)
Hemoglobin: 13.6 g/dL (ref 13.0–17.0)
Immature Granulocytes: 1 %
Lymphocytes Relative: 35 %
Lymphs Abs: 3.6 10*3/uL (ref 0.7–4.0)
MCH: 29.6 pg (ref 26.0–34.0)
MCHC: 32.7 g/dL (ref 30.0–36.0)
MCV: 90.4 fL (ref 80.0–100.0)
Monocytes Absolute: 1.3 10*3/uL — ABNORMAL HIGH (ref 0.1–1.0)
Monocytes Relative: 12 %
Neutro Abs: 4.7 10*3/uL (ref 1.7–7.7)
Neutrophils Relative %: 44 %
Platelet Count: <5 10*3/uL — CL (ref 150–400)
RBC: 4.6 MIL/uL (ref 4.22–5.81)
RDW: 14.4 % (ref 11.5–15.5)
WBC Count: 10.3 10*3/uL (ref 4.0–10.5)
nRBC: 0.3 % — ABNORMAL HIGH (ref 0.0–0.2)

## 2023-06-09 LAB — TYPE AND SCREEN
ABO/RH(D): A POS
Antibody Screen: NEGATIVE

## 2023-06-09 LAB — PROTIME-INR
INR: 1.1 (ref 0.8–1.2)
Prothrombin Time: 14.8 s (ref 11.4–15.2)

## 2023-06-09 MED ORDER — ROMIPLOSTIM 250 MCG ~~LOC~~ SOLR
2.0000 ug/kg | Freq: Once | SUBCUTANEOUS | Status: AC
  Administered 2023-06-09: 160 ug via SUBCUTANEOUS
  Filled 2023-06-09: qty 0.32

## 2023-06-09 MED ORDER — ONDANSETRON HCL 4 MG PO TABS: 4.0000 mg | ORAL_TABLET | Freq: Four times a day (QID) | ORAL | Status: DC | PRN

## 2023-06-09 MED ORDER — ROSUVASTATIN CALCIUM 20 MG PO TABS
40.0000 mg | ORAL_TABLET | Freq: Every day | ORAL | Status: DC
  Administered 2023-06-09 – 2023-06-12 (×4): 40 mg via ORAL
  Filled 2023-06-09 (×4): qty 2

## 2023-06-09 MED ORDER — DEXAMETHASONE SODIUM PHOSPHATE 10 MG/ML IJ SOLN
40.0000 mg | INTRAMUSCULAR | Status: AC
  Administered 2023-06-09 – 2023-06-12 (×4): 40 mg via INTRAVENOUS
  Filled 2023-06-09: qty 4
  Filled 2023-06-09: qty 10
  Filled 2023-06-09 (×3): qty 4

## 2023-06-09 MED ORDER — HYDROCODONE-ACETAMINOPHEN 10-325 MG PO TABS
1.0000 | ORAL_TABLET | Freq: Every day | ORAL | Status: DC
  Administered 2023-06-09 – 2023-06-12 (×4): 1 via ORAL
  Filled 2023-06-09 (×4): qty 1

## 2023-06-09 MED ORDER — ONDANSETRON HCL 4 MG/2ML IJ SOLN: 4.0000 mg | Freq: Four times a day (QID) | INTRAMUSCULAR | Status: DC | PRN

## 2023-06-09 MED ORDER — IMMUNE GLOBULIN (HUMAN) 10 GM/100ML IV SOLN
1.0000 g/kg | INTRAVENOUS | Status: AC
  Administered 2023-06-09 – 2023-06-10 (×2): 80 g via INTRAVENOUS
  Filled 2023-06-09 (×2): qty 800

## 2023-06-09 MED ORDER — LEVETIRACETAM 500 MG PO TABS
1500.0000 mg | ORAL_TABLET | Freq: Two times a day (BID) | ORAL | Status: DC
  Administered 2023-06-09 – 2023-06-13 (×8): 1500 mg via ORAL
  Filled 2023-06-09 (×8): qty 3

## 2023-06-09 MED ORDER — SERTRALINE HCL 100 MG PO TABS
200.0000 mg | ORAL_TABLET | Freq: Every day | ORAL | Status: DC
  Administered 2023-06-10 – 2023-06-13 (×4): 200 mg via ORAL
  Filled 2023-06-09: qty 4
  Filled 2023-06-09 (×3): qty 2

## 2023-06-09 MED ORDER — PANTOPRAZOLE SODIUM 40 MG IV SOLR
40.0000 mg | Freq: Two times a day (BID) | INTRAVENOUS | Status: DC
Start: 2023-06-09 — End: 2023-06-13
  Administered 2023-06-09 – 2023-06-13 (×8): 40 mg via INTRAVENOUS
  Filled 2023-06-09 (×8): qty 10

## 2023-06-09 MED ORDER — SODIUM CHLORIDE 0.9% IV SOLUTION: Freq: Once | INTRAVENOUS | Status: DC

## 2023-06-09 MED ORDER — HYDROCODONE-ACETAMINOPHEN 10-325 MG PO TABS: 1.0000 | ORAL_TABLET | Freq: Every day | ORAL | Status: DC | PRN

## 2023-06-09 MED ORDER — ACETAMINOPHEN 325 MG PO TABS: 650.0000 mg | ORAL_TABLET | Freq: Four times a day (QID) | ORAL | Status: DC | PRN

## 2023-06-09 MED ORDER — AMLODIPINE BESYLATE 5 MG PO TABS
10.0000 mg | ORAL_TABLET | Freq: Every day | ORAL | Status: DC
  Administered 2023-06-09 – 2023-06-12 (×4): 10 mg via ORAL
  Filled 2023-06-09 (×4): qty 2

## 2023-06-09 MED ORDER — VITAMIN D 25 MCG (1000 UNIT) PO TABS
2000.0000 [IU] | ORAL_TABLET | Freq: Every day | ORAL | Status: DC
  Administered 2023-06-09 – 2023-06-12 (×4): 2000 [IU] via ORAL
  Filled 2023-06-09 (×4): qty 2

## 2023-06-09 MED ORDER — BUPROPION HCL ER (XL) 150 MG PO TB24
150.0000 mg | ORAL_TABLET | Freq: Every day | ORAL | Status: DC
  Administered 2023-06-10 – 2023-06-13 (×4): 150 mg via ORAL
  Filled 2023-06-09 (×4): qty 1

## 2023-06-09 MED ORDER — SENNOSIDES-DOCUSATE SODIUM 8.6-50 MG PO TABS: 1.0000 | ORAL_TABLET | Freq: Every evening | ORAL | Status: DC | PRN

## 2023-06-09 MED ORDER — ACETAMINOPHEN 650 MG RE SUPP: 650.0000 mg | Freq: Four times a day (QID) | RECTAL | Status: DC | PRN

## 2023-06-09 NOTE — ED Notes (Signed)
 Pillow placed under patient's ride side. Pt denies any pain or discomfort at this time.

## 2023-06-09 NOTE — Consult Note (Signed)
 [name] is well-known to me.  He is a very nice 78 year old white male.  He has 2 separate hematologic issues.  He has a history of CVA.  He has a lupus anticoagulant.  He is on Xarelto and aspirin for this.  So far, this is not been an issue for him.  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  He has not had any problems with the ITP for quite a while.  I last saw him in the office about 6 months ago.  Unfortunately, he developed shingles last week.  He has been on valacyclovir for this.  According to his wife, he developed this on Friday.  On Saturday, he began to develop petechiae and angiomas.  He had some blood blisters in the mouth.  He has some nosebleeds.  He came to the office today.  He was found to have a platelet count of 5000.  His CBC showed a white cell count of 11.3.  Hemoglobin 12.7.  Platelet count less than 5000.  His BUN 29 creatinine 1.31.  He was sent to the ER.  Unfortunately, his insurance company would not let this give him IVIG in the office without prior approval.  Again, it is been quite a while since he has had a exacerbation of his ITP.  In the past, he is always responded to Decadron.  We have given him IVIG and Nplate.  Again he is on anticoagulation with Xarelto.  We will go ahead and stop this.  Again has had no real bleeding.  He has had no hematuria.  There is been no melena or bright red blood per rectum.  He has had no fever.  He has had no falling.  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  He has had no headache.  He was brought to the ER.  His vital signs show temperature of 98.4.  Pulse 79.  Blood pressure 132/87.  Head neck exam does show some oral petechia and hematomas.  He has no adenopathy in the neck.  Lungs are clear bilaterally.  Cardiac exam regular rate and rhythm.  Abdomen is soft.  Bowel sounds are present.  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  He has no hepatomegaly.  Extremities shows  the paralysis on the left side.  Skin exam shows petechia and ecchymoses throughout.  Neurological exam shows no focal deficits outside of the left-sided Hemi paresis.   Again, Mr. Meisinger has 2 separate hematologic issues.  The problem right now is the ITP.  This is relapse.  Of note, he has never had Rituxan.  We have not had to use Rituxan since he is always responded to the Decadron/IVIG/Nplate.  Has been quite a while since he has had a significant relapse.  Again, we will see how he does with the high-dose Decadron, IVIG, and Nplate.  Will do the IVIG over 2 days.  We will have to hold his anticoagulation until his platelet count gets above 100,000.  We will follow him along in the hospital.  I do appreciate all the great care that I know he will get.    Christin Bach, MD  Mark [name] 17:14

## 2023-06-09 NOTE — ED Triage Notes (Signed)
 Patient said he has had shingles on his back since Friday. Now has bilateral arm rash. Last night patient had a nosebleed. Has bleeding in his mouth and throat and nose today. Layla Maw. Did not take a dose today.

## 2023-06-09 NOTE — ED Provider Notes (Addendum)
 [place] [facility]GENC[facility] DEPARTMENT AT [facility] Provider Note   CSN: [id number] Arrival date & time: 06/09/23  1411     History Chief Complaint  Patient presents with   Epistaxis    HPI Mark [name] is a 78 y.o. male presenting for chief complaint of epistaxis.  Had a large episode last night lasting for 27 hours.  Family tried to manage at home.  Followed up in oncology clinic this morning as patient has a history of ITP.  Found to be thrombocytopenic.  Fortunately epistaxis has resolved in the interim. Denies fevers chills nausea vomiting syncope shortness of breath.  Otherwise ambulatory tolerating p.o. intake on arrival.  Patient's recorded medical, surgical, social, medication list and allergies were reviewed in the Snapshot window as part of the initial history.   Review of Systems   Review of Systems  Constitutional:  Negative for chills and fever.  HENT:  Positive for nosebleeds. Negative for ear pain and sore throat.   Eyes:  Negative for pain and visual disturbance.  Respiratory:  Negative for cough and shortness of breath.   Cardiovascular:  Negative for chest pain and palpitations.  Gastrointestinal:  Negative for abdominal pain and vomiting.  Genitourinary:  Negative for dysuria and hematuria.  Musculoskeletal:  Negative for arthralgias and back pain.  Skin:  Negative for color change and rash.  Neurological:  Negative for seizures and syncope.  All other systems reviewed and are negative.   Physical Exam Updated Vital Signs BP (!) 159/80   Pulse 72   Temp 97.7 F (36.5 C) (Oral)   Resp 16   Ht 6' (1.829 m)   Wt 79.4 kg   SpO2 96%   BMI 23.73 kg/m  Physical Exam Vitals and nursing note reviewed.  Constitutional:      General: He is not in acute distress.    Appearance: He is well-developed.  HENT:     Head: Normocephalic and atraumatic.  Eyes:     Conjunctiva/sclera: Conjunctivae normal.  Cardiovascular:     Rate and Rhythm: Normal  rate and regular rhythm.     Heart sounds: No murmur heard. Pulmonary:     Effort: Pulmonary effort is normal. No respiratory distress.     Breath sounds: Normal breath sounds.  Abdominal:     Palpations: Abdomen is soft.     Tenderness: There is no abdominal tenderness.  Musculoskeletal:        General: No swelling.     Cervical back: Neck supple.  Skin:    General: Skin is warm and dry.     Capillary Refill: Capillary refill takes less than 2 seconds.     Findings: Rash (Diffuse petechiae.) present.  Neurological:     Mental Status: He is alert.  Psychiatric:        Mood and Affect: Mood normal.      ED Course/ Medical Decision Making/ A&P    Procedures .Critical Care  Performed by: Glyn Ade, MD Authorized by: Glyn Ade, MD   Critical care provider statement:    Critical care time (minutes):  30   Critical care was necessary to treat or prevent imminent or life-threatening deterioration of the following conditions:  Circulatory failure   Critical care was time spent personally by me on the following activities:  Development of treatment plan with patient or surrogate, discussions with consultants, evaluation of patient's response to treatment, examination of patient, ordering and review of laboratory studies, ordering and review of radiographic studies, ordering  and performing treatments and interventions, pulse oximetry, re-evaluation of patient's condition and review of old charts    Medications Ordered in ED Medications  0.9 %  sodium chloride infusion (Manually program via Guardrails IV Fluids) (has no administration in time range)  Immune Globulin 10% (PRIVIGEN) IV infusion 80 g (has no administration in time range)  romiPLOStim (NPLATE) injection 160 mcg (has no administration in time range)  dexamethasone (DECADRON) injection 40 mg (has no administration in time range)  pantoprazole (PROTONIX) injection 40 mg (has no administration in time range)     Medical Decision Making:   Patient's history of present illness and physicals and findings are most consistent with an acute ITP flare. Fortunately bleeding event has stopped. Patient consented to transfusion of platelets. Consulted oncology who placed orders for IVIG and dexamethasone and agreed with admission to the hospital for close monitoring.  Patient observed in the ER for 3-1/2 hours with no further bleeding event.  Transfusion started. Discussed with hospitalist who agreed with admission at this time.  Disposition:   Based on the above findings, I believe this patient is stable for admission.    Patient/family educated about specific findings on our evaluation and explained exact reasons for admission.  Patient/family educated about clinical situation and time was allowed to answer questions.   Admission team communicated with and agreed with need for admission. Patient admitted. Patient  ready to move at this time.     Emergency Department Medication Summary:   Medications  0.9 %  sodium chloride infusion (Manually program via Guardrails IV Fluids) (has no administration in time range)  Immune Globulin 10% (PRIVIGEN) IV infusion 80 g (has no administration in time range)  romiPLOStim (NPLATE) injection 160 mcg (has no administration in time range)  dexamethasone (DECADRON) injection 40 mg (has no administration in time range)  pantoprazole (PROTONIX) injection 40 mg (has no administration in time range)        Clinical Impression:  1. Idiopathic thrombocytopenic purpura (HCC)   2. Epistaxis   3. Thrombocytopenia (HCC)      Admit   Final Clinical Impression(s) / ED Diagnoses Final diagnoses:  Epistaxis  Thrombocytopenia [facility])    Rx / DC Orders ED Discharge Orders     None         Glyn Ade, MD 06/09/23 Karren Burly    Glyn Ade, MD 06/09/23 [phone]

## 2023-06-09 NOTE — Telephone Encounter (Signed)
 Critical lab report from Jessica: platelet count = <5. Dr Myna Hidalgo and Clent Jacks made aware. Dr Myna Hidalgo to speak with pt in office today.

## 2023-06-09 NOTE — Progress Notes (Signed)
 Hematology and Oncology Follow Up Visit  Mark [name] [id number] 15-Mar-1946 78 y.o. 06/09/2023   Principle Diagnosis:  1. ITP --occasionally relapsed-last relapse 01/06/2023. 2. 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 3. Positive lupus anticoagulant.   Current Therapy:        Xarelto 20 mg PO daily/ EC ASA 81 mg po q day   Interim History:  Mark [name] is here today with his wife for an acute visit. He started to have a shingles like rash of his back on Thursday 06/05/2023. He was started on acyclovir. Then yesterday they started to notice blood blisters on his mouth, arms similar to his previous ITP relapses. Since then his rash/bruising has worsened. He is now bleeding in his gums/mouth and having some occasional bleeding from his nose.   There is no headache or confusion.  His appetite has been good.  He has had no change in bowel habits. He has had no cough or shortness of breath.  He has had no leg swelling.  Overall, I would say that his performance status is probably ECOG 2.  Wt Readings from Last 3 Encounters:  01/16/23 179 lb 1.9 oz (81.2 kg)  05/23/21 186 lb 12.8 oz (84.7 kg)  06/29/20 189 lb (85.7 kg)   Medications:  Allergies as of 06/09/2023       Reactions   Diphenhydramine Other (See Comments)   Other reaction(s): affects kidney   Famotidine Other (See Comments)   Other reaction(s): affects kidneys        Medication List        Accurate as of June 09, 2023  1:18 PM. If you have any questions, ask your nurse or doctor.          STOP taking these medications    penicillin v potassium 500 MG tablet Commonly known as: VEETID Stopped by: Rushie Chestnut       TAKE these medications    Acetaminophen 500 MG capsule Take 650 mg by mouth at bedtime.   amLODipine 10 MG tablet Commonly known as: NORVASC Take 10 mg by mouth daily.   aspirin EC 81 MG tablet Take 81 mg by mouth daily. Swallow whole.   BACLOFEN  PO Take 10 mg by mouth 2 (two) times daily.   buPROPion 150 MG 24 hr tablet Commonly known as: WELLBUTRIN XL Take 150 mg by mouth daily.   Crestor 20 MG tablet Generic drug: rosuvastatin Take 20 mg by mouth at bedtime.   D2000 Ultra Strength 50 MCG (2000 UT) Caps Generic drug: Cholecalciferol Take 1 capsule by mouth daily.   dexamethasone 4 MG tablet Commonly known as: DECADRON TAKE 10 TABLET BY MOUTH ONCE DAILY AS NEEDED FOR 3 DAYS FOR  ITP   fluticasone 50 MCG/ACT nasal spray Commonly known as: FLONASE Place 2 sprays into both nostrils daily as needed for allergies or rhinitis.   HYDROcodone-acetaminophen 10-325 MG tablet Commonly known as: NORCO Take 1 tablet by mouth 2 (two) times daily as needed.   levETIRAcetam 500 MG tablet Commonly known as: KEPPRA Take 1,500 mg by mouth 2 (two) times daily.   omeprazole 40 MG capsule Commonly known as: PRILOSEC Take 40 mg by mouth every morning.   polyethylene glycol 17 g packet Commonly known as: MIRALAX / GLYCOLAX Take 17 g by mouth daily as needed.   sertraline 100 MG tablet Commonly known as: ZOLOFT Take 200 mg by mouth in the morning.   valACYclovir 1000 MG tablet Commonly known as: VALTREX Take  1,000 mg by mouth 3 (three) times daily.   Xarelto 20 MG Tabs tablet Generic drug: rivaroxaban Take 1 tablet by mouth once daily with breakfast What changed: how much to take        Allergies:  Allergies  Allergen Reactions   Diphenhydramine Other (See Comments)    Other reaction(s): affects kidney   Famotidine Other (See Comments)    Other reaction(s): affects kidneys    Past Medical History, Surgical history, Social history, and Family History were reviewed and updated.  Review of Systems: Review of Systems  Constitutional:  Negative for weight loss.  HENT: Negative.    Eyes: Negative.   Respiratory: Negative.    Cardiovascular: Negative.   Gastrointestinal: Negative.   Genitourinary: Negative.    Musculoskeletal: Negative.   Skin:  Positive for rash.  Neurological:  Negative for focal weakness.  Endo/Heme/Allergies:  Does not bruise/bleed easily.  Psychiatric/Behavioral: Negative.       Physical Exam:  oral temperature is 98.9 F (37.2 C). His blood pressure is 132/87 and his pulse is 86. His respiration is 20 and oxygen saturation is 98%.   Wt Readings from Last 3 Encounters:  01/16/23 179 lb 1.9 oz (81.2 kg)  05/23/21 186 lb 12.8 oz (84.7 kg)  06/29/20 189 lb (85.7 kg)    Physical Exam Vitals reviewed.  Constitutional:      Comments: Sitting comfortably in wheelchair  HENT:     Head: Normocephalic and atraumatic.     Nose:     Comments: Scant dry crusted blood of internal nostrils Eyes:     Pupils: Pupils are equal, round, and reactive to light.  Cardiovascular:     Rate and Rhythm: Normal rate and regular rhythm.     Heart sounds: Normal heart sounds.  Pulmonary:     Effort: Pulmonary effort is normal.     Breath sounds: Normal breath sounds.  Abdominal:     General: Bowel sounds are normal.     Palpations: Abdomen is soft.  Musculoskeletal:        General: No tenderness or deformity. Normal range of motion.     Cervical back: Normal range of motion.  Lymphadenopathy:     Cervical: No cervical adenopathy.  Skin:    General: Skin is warm and dry.     Findings: Bruising (Resolving scattered moderately sized areas of ecchymosis  of the arms), lesion (slow weeping large lesion of the left internal mouth) and rash present. No erythema.  Neurological:     Mental Status: He is alert and oriented to person, place, and time.     Comments: Neurological exam shows a continued weakness over on the left side.  Psychiatric:        Behavior: Behavior normal.        Thought Content: Thought content normal.        Judgment: Judgment normal.      Lab Results  Component Value Date   WBC 10.3 06/09/2023   HGB 13.6 06/09/2023   HCT 41.6 06/09/2023   MCV 90.4  06/09/2023   PLT <5 (LL) 06/09/2023   Lab Results  Component Value Date   FERRITIN 1,063 (H) 10/23/2016   IRON 15 (L) 06/30/2020   TIBC 196 (L) 06/30/2020   UIBC 181 06/30/2020   IRONPCTSAT 8 (L) 06/30/2020   Lab Results  Component Value Date   RETICCTPCT 1.5 08/20/2010   RBC 4.60 06/09/2023   RETICCTABS 75.3 08/20/2010   No results found for: "KPAFRELGTCHN", "LAMBDASER", "KAPLAMBRATIO"  No results found for: "IGGSERUM", "IGA", "IGMSERUM" No results found for: "TOTALPROTELP", "ALBUMINELP", "A1GS", "A2GS", "BETS", "BETA2SER", "GAMS", "MSPIKE", "SPEI"   Chemistry      Component Value Date/Time   NA 141 06/09/2023 1146   NA 146 (H) 04/09/2017 1405   NA 138 05/09/2016 1321   K 4.1 06/09/2023 1146   K 4.0 04/09/2017 1405   K 4.3 05/09/2016 1321   CL 106 06/09/2023 1146   CL 108 04/09/2017 1405   CO2 26 06/09/2023 1146   CO2 23 04/09/2017 1405   CO2 22 05/09/2016 1321   BUN 24 (H) 06/09/2023 1146   BUN 34 (H) 04/09/2017 1405   BUN 36.4 (H) 05/09/2016 1321   CREATININE 1.32 (H) 06/09/2023 1146   CREATININE 1.61 (H) 03/03/2023 1450   CREATININE 1.3 (H) 04/09/2017 1405   CREATININE 1.5 (H) 05/09/2016 1321      Component Value Date/Time   CALCIUM 9.8 06/09/2023 1146   CALCIUM 9.1 04/09/2017 1405   CALCIUM 9.9 05/09/2016 1321   ALKPHOS 80 06/09/2023 1146   ALKPHOS 61 04/09/2017 1405   ALKPHOS 103 05/09/2016 1321   AST 18 06/09/2023 1146   AST 20 03/03/2023 1450   AST 39 (H) 05/09/2016 1321   ALT 22 06/09/2023 1146   ALT 30 03/03/2023 1450   ALT 57 (H) 04/09/2017 1405   ALT 64 (H) 05/09/2016 1321   BILITOT 0.4 06/09/2023 1146   BILITOT 0.5 03/03/2023 1450   BILITOT 0.49 05/09/2016 1321     Encounter Diagnoses  Name Primary?   Idiopathic thrombocytopenic purpura (HCC) Yes   Lupus anticoagulant disorder (HCC)     Impression and Plan: Mark [name] is a very pleasant 78 yo caucasian gentleman with ITP.  He has occasional relapses for which he gets high-dose Decadron  for 3 days which works well for him.  Today his platelet count is critically low at 5. He is actively scantly bleeding from a lesion in his mouth. Due to risk of death from bleeding it has been advised that he go to the hospital for treatment and surveillance. Hgb is reassuring for private vehicle transportation (per patient request)to the hospital for treatment of his acute ITP. Dr. Myna Hidalgo has been made aware and will round on patient. ER triage nurse alerted to patients upcoming arrival, labs, and bleeding status.   He will need to follow up when he is discharged from the hospital   Rushie Chestnut, PA-C 2/24/20251:18 PM

## 2023-06-09 NOTE — H&P (Addendum)
 History and Physical  Mark [name] ZOX:[id number] DOB: 11-10-1945 DOA: 06/09/2023  PCP: Laurann Montana, MD   Chief Complaint: Severe thrombocytopenia  HPI: Mark [name] is a 78 y.o. male with medical history significant for ITP, lupus anticoagulant, CAD, HTN, CVA with residual left-sided weakness, HLD, CKD 3A, GERD and seizure who presented to the heme-onc office for evaluation and management of severe thrombocytopenia.  Patient reports she was diagnosed with shingles last Friday and started on Valtrex.  Since then, he has noticed progressive petechia and purpura on his body.  Last night, he started having nosebleed which was treated with Afrin at home by his wife. He was evaluated at his oncologist office and was found to have significant thrombocytopenia so she was sent directly to the ER for management of his ITP exacerbation. Patient reports in the past, his wife would notice the petechiae early it was often treated with Decadron with significant improvement without requiring hospitalization. This time, his skin findings progressed pretty fast and included angiomas and purpura that are new but he denies any hemoptysis, hematuria, melena, dizziness, headaches, syncope, fevers or chills.  He has chronic left-sided hemiparesis that is unchanged and he receives physical therapy twice weekly at home.  ED Course: Initial vitals stable.  Initial labs showed WBC 11.3, Hgb 12.7, platelet <5,000, bicarb 21, BUN/creatinine 29/1.31, PT/INR 14.8/1.1, hematology/oncology following patient, recommended transfusion of 1 unit of platelet.  IVIG 80 g, IV Decadron 40 mg and Nplate 160 mcg ordered by patient's hematologist, Dr. Myna Hidalgo [facility] consulted for admission  Review of Systems: Please see HPI for pertinent positives and negatives. A complete 10 system review of systems are otherwise negative.  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    Lupus anticoagulant disorder (HCC)    Lupus anticoagulant syndrome (HCC)    Multiple closed anterior-posterior compression fractures of pelvis (HCC)    Seizures (HCC)    last seizure 10 years ago approx    Sleep apnea    cpap broken x 1 year per wife    Stroke (HCC) 02/27/2011   Left side weakness   Past Surgical History:  Procedure Laterality Date   blood clot Right    surgical removal   CHOLECYSTECTOMY     CORONARY ANGIOPLASTY  1992   CYSTOGRAM N/A 02/17/2020   Procedure: CYSTOGRAM;  Surgeon: Crist Fat, MD;  Location: [facility];  Service: Urology;  Laterality: N/A;   CYSTOSCOPY WITH RETROGRADE PYELOGRAM, URETEROSCOPY AND STENT PLACEMENT Bilateral 02/17/2020   Procedure: Effie Shy  URETEROSCOPY AND STENT PLACEMENT;  Surgeon: Crist Fat, MD;  Location: [facility];  Service: Urology;  Laterality: Bilateral;   SPLENECTOMY, TOTAL     vertebralplasty     Social History:  reports that he quit smoking about 38 years ago. His smoking use included cigarettes. He started smoking about 55 years ago. He has a 16.6 pack-year smoking history. He has never used smokeless tobacco. He reports that he does not currently use drugs. He reports that he does not drink alcohol.  Allergies  Allergen  Reactions   Diphenhydramine Other (See Comments)    Other reaction(s): affects kidney   Famotidine Other (See Comments)    Other reaction(s): affects kidneys    History reviewed. No pertinent family history.   Prior to Admission medications   Medication Sig Start Date End Date Taking? Authorizing Provider  Acetaminophen 500 MG capsule Take 650 mg by mouth at bedtime.    [provider]  amLODipine (NORVASC) 10 MG tablet Take 10 mg by mouth daily. 02/16/23   [provider]  aspirin EC 81 MG tablet Take 81 mg by mouth daily. Swallow whole.    [provider]  BACLOFEN PO Take 10 mg by mouth 2 (two) times daily.    [provider]  buPROPion (WELLBUTRIN XL) 150 MG 24 hr tablet Take 150 mg by mouth daily.  12/19/14   [provider]  Cholecalciferol (D2000 ULTRA STRENGTH) 50 MCG (2000 UT) CAPS Take 1 capsule by mouth daily. 01/07/16   [provider]  CRESTOR 20 MG tablet Take 20 mg by mouth at bedtime.  11/16/14   [provider]  dexamethasone (DECADRON) 4 MG tablet TAKE 10 TABLET BY MOUTH ONCE DAILY AS NEEDED FOR 3 DAYS FOR  ITP Patient not taking: Reported on 06/09/2023 05/21/23   Josph Macho, MD  fluticasone [facility]) 50 MCG/ACT nasal spray Place 2 sprays into both nostrils daily as needed for allergies or rhinitis.  Patient not taking: Reported on 06/09/2023 06/23/14   [provider]  HYDROcodone-acetaminophen (NORCO) 10-325 MG tablet Take 1 tablet by mouth 2 (two) times daily as needed. 06/06/23   [provider]  levETIRAcetam (KEPPRA) 500 MG tablet Take 1,500 mg by mouth 2 (two) times daily.  12/24/11   [provider]  omeprazole (PRILOSEC) 40 MG capsule Take 40 mg by mouth every morning. 08/01/19   [provider]  polyethylene glycol (MIRALAX / GLYCOLAX) packet Take 17 g by mouth daily as needed. Patient not taking: Reported on 06/09/2023 10/25/16   Zannie Cove, MD  sertraline (ZOLOFT) 100 MG tablet Take 200 mg by mouth in the morning.     [provider]  valACYclovir (VALTREX) 1000 MG tablet Take 1,000 mg by mouth 3 (three) times daily. 06/04/23   [provider]  XARELTO 20 MG TABS tablet Take 1 tablet by mouth once daily with breakfast Patient taking differently: Take 20 mg by mouth daily with breakfast. 10/21/19   Josph Macho, MD    Physical  Exam: BP (!) 159/80   Pulse 72   Temp 97.7 F (36.5 C) (Oral)   Resp 16   Ht 6' (1.829 m)   Wt 79.4 kg   SpO2 96%   BMI 23.73 kg/m  General: Pleasant, well-appearing elderly man laying in bed. No acute distress. HEENT: [place]/AT. Anicteric sclera. Few oral petechiae and hematomas. Dried blood in left nares.  Neck: Supple with no lymphadenopathy. CV: RRR. No murmurs, rubs, or gallops. No LE edema Pulmonary: Lungs CTAB. Normal effort. No wheezing or rales. Abdominal: Soft, nontender, nondistended. Normal bowel sounds. Extremities: Palpable radial and DP pulses. Normal ROM.  Skin: Warm and dry. Petechiae and purpura on trunk and both extremities. Neuro: A&Ox3.  Strength 5/5 in RUE/RLE, 3/5 in LLE, 1/5 in LUE.  Unable to hold left upper extremity against gravity. Contracted left fingers in a fist, unable to extend. Normal sensation to light touch.  Psych: Normal mood and affect          Labs  on Admission:  Basic Metabolic Panel: Recent Labs  Lab 06/09/23 1146 06/09/23 1545  NA 141 137  K 4.1 4.0  CL 106 108  CO2 26 21*  GLUCOSE 102* 97  BUN 24* 29*  CREATININE 1.32* 1.31*  CALCIUM 9.8 9.1   Liver Function Tests: Recent Labs  Lab 06/09/23 1146 06/09/23 1545  AST 18 21  ALT 22 25  ALKPHOS 80 70  BILITOT 0.4 0.4  PROT 7.0 6.9  ALBUMIN 4.5 3.7   No results for input(s): "LIPASE", "AMYLASE" in the last 168 hours. No results for input(s): "AMMONIA" in the last 168 hours. CBC: Recent Labs  Lab 06/09/23 1146 06/09/23 1545  WBC 10.3 11.3*  NEUTROABS 4.7 5.4  HGB 13.6 12.7*  HCT 41.6 40.7  MCV 90.4 92.3  PLT <5* <5*   Cardiac Enzymes: No results for input(s): "CKTOTAL", "CKMB", "CKMBINDEX", "TROPONINI" in the last 168 hours. BNP (last 3 results) No results for input(s): "BNP" in the last 8760 hours.  ProBNP (last 3 results) No results for input(s): "PROBNP" in the last 8760 hours.  CBG: No results for input(s): "GLUCAP" in the last 168 hours.  Radiological  Exams on Admission: No results found. Assessment/Plan Mark [name] is a 78 y.o. male with medical history significant for ITP, lupus anticoagulant, anxiety and depression, CAD, HTN, CVA with residual left-sided weakness, HLD, CKD 3A, GERD and seizure who presented to the heme-onc office for evaluation and management of severe thrombocytopenia in the setting of ITP exacerbation.  # Exacerbation of immune thrombocytopenic purpura # Thrombocytopenia Patient with history of ITP with previous exacerbations where treated with Decadron now presenting with significant thrombocytopenia (<5000) in the setting of recent shingles infection. He has had epistaxis that has now resolved as well as progressive petechia and purpura on skin exam. Status post 1 unit of platelet transfusion in the ED. -Hematology following, appreciate recs -Continue IVIG 80 g every 24 hours x2 -Continue Decadron 40 mg every 24 hours x4 -Nplate 160 mcg subcu injection x1 -Continue IV Protonix 40 mg every 12 hours -Follow-up repeat CBC  # HTN BP elevated with SBP in the 130s to 160s. -Continue amlodipine  # Lupus anticoagulant # Hx of CVA # HLD Has residual left-sided weakness worse in the left upper extremity that is unchanged. -Continue aspirin and rosuvastatin -Hold Xarelto in the setting of thrombocytopenia -PT/OT eval  # CKD 3A Kidney function stable with creatinine of 1.34. -Avoid nephrotoxic agents trend renal function  # GERD -Continue IV PPI  # Seizure -Continue Keppra -Seizure precautions  # Anxiety and depression -Continue bupropion and sertraline  # Difficulty swallowing Per spouse, patient has had difficulty swallowing and often takes his meds with applesauce. -SLP consulted, appreciate recs -Aspiration precautions  DVT prophylaxis: SCDs    Code Status: Limited: Do not attempt resuscitation (DNR) -DNR-LIMITED -Do Not Intubate/DNI   Consults called: Hematology/oncology  Family  Communication: No family at bedside  Severity of Illness: The appropriate patient status for this patient is INPATIENT. Inpatient status is judged to be reasonable and necessary in order to provide the required intensity of service to ensure the patient's safety. The patient's presenting symptoms, physical exam findings, and initial radiographic and laboratory data in the context of their chronic comorbidities is felt to place them at high risk for further clinical deterioration. Furthermore, it is not anticipated that the patient will be medically stable for discharge from the hospital within 2 midnights of admission.   * I certify that at the  point of admission it is my clinical judgment that the patient will require inpatient hospital care spanning beyond 2 midnights from the point of admission due to high intensity of service, high risk for further deterioration and high frequency of surveillance required.*  Level of care: Progressive  Steffanie Rainwater, MD 06/09/2023, 5:35 PM [facility] Pager: [phone] Isaiah 41:10   If 7PM-7AM, please contact night-coverage [email or web address] Password [email or web address]

## 2023-06-09 NOTE — ED Provider Triage Note (Signed)
 Emergency Medicine Provider Triage Evaluation Note  TRAESON DUSZA , a 78 y.o. male  was evaluated in triage.  Pt complains of nosebleed, gum bleeding.  Started last night.  Also has concomitant shingles.  Review of Systems  Positive: As above Negative: As above  Physical Exam  BP 132/87   Pulse 89   Temp 98.4 F (36.9 C) (Oral)   Resp 18   Ht 6' (1.829 m)   Wt 79.4 kg   SpO2 96%   BMI 23.73 kg/m  Gen:   Awake, no distress   Resp:  Normal effort  MSK:   Moves extremities without difficulty  Other:    Medical Decision Making  Medically screening exam initiated at 2:51 PM.  Appropriate orders placed.  Neldon Newport was informed that the remainder of the evaluation will be completed by another provider, this initial triage assessment does not replace that evaluation, and the importance of remaining in the ED until their evaluation is complete.    Marita Kansas, PA-C 06/09/23 1452

## 2023-06-09 NOTE — Telephone Encounter (Signed)
 Received call from [place], patients wife stating that she is in a "panicked situation" that Mark [name] has been experiencing nosebleeds  with black blisters on gums.  Has petechiae rash on right arm.  This morning a large blood clot came out of his nose after doing saline rinses.  Patient has been diagnosed with shingles over a video visit with primary care.  They are scabbed over at this time and patient is on Valcyclovir.  Dr Myna Hidalgo notified.  Wants patient to come in for labs and see a provider.

## 2023-06-10 DIAGNOSIS — D693 Immune thrombocytopenic purpura: Secondary | ICD-10-CM | POA: Diagnosis not present

## 2023-06-10 DIAGNOSIS — D696 Thrombocytopenia, unspecified: Secondary | ICD-10-CM

## 2023-06-10 LAB — BASIC METABOLIC PANEL
Anion gap: 8 (ref 5–15)
BUN: 26 mg/dL — ABNORMAL HIGH (ref 8–23)
CO2: 21 mmol/L — ABNORMAL LOW (ref 22–32)
Calcium: 8.9 mg/dL (ref 8.9–10.3)
Chloride: 103 mmol/L (ref 98–111)
Creatinine, Ser: 0.82 mg/dL (ref 0.61–1.24)
GFR, Estimated: >60 mL/min (ref >60)
Glucose, Bld: 180 mg/dL — ABNORMAL HIGH (ref 70–99)
Potassium: 4 mmol/L (ref 3.5–5.1)
Sodium: 132 mmol/L — ABNORMAL LOW (ref 135–145)

## 2023-06-10 LAB — CBC WITH DIFFERENTIAL/PLATELET
Abs Immature Granulocytes: 0.05 10*3/uL (ref 0.00–0.07)
Basophils Absolute: 0 10*3/uL (ref 0.0–0.1)
Basophils Relative: 1 %
Eosinophils Absolute: 0 10*3/uL (ref 0.0–0.5)
Eosinophils Relative: 0 %
HCT: 36.3 % — ABNORMAL LOW (ref 39.0–52.0)
Hemoglobin: 12 g/dL — ABNORMAL LOW (ref 13.0–17.0)
Immature Granulocytes: 1 %
Lymphocytes Relative: 21 %
Lymphs Abs: 1.2 10*3/uL (ref 0.7–4.0)
MCH: 29.9 pg (ref 26.0–34.0)
MCHC: 33.1 g/dL (ref 30.0–36.0)
MCV: 90.5 fL (ref 80.0–100.0)
Monocytes Absolute: 0.1 10*3/uL (ref 0.1–1.0)
Monocytes Relative: 1 %
Neutro Abs: 4.4 10*3/uL (ref 1.7–7.7)
Neutrophils Relative %: 76 %
Platelets: 13 10*3/uL — CL (ref 150–400)
RBC: 4.01 MIL/uL — ABNORMAL LOW (ref 4.22–5.81)
RDW: 14.4 % (ref 11.5–15.5)
Smear Review: DECREASED
WBC: 5.7 10*3/uL (ref 4.0–10.5)
nRBC: 0.5 % — ABNORMAL HIGH (ref 0.0–0.2)

## 2023-06-10 LAB — BPAM PLATELET PHERESIS
Blood Product Expiration Date: 202502242359
ISSUE DATE / TIME: 202502241722
Unit Type and Rh: 6200

## 2023-06-10 LAB — PREPARE PLATELET PHERESIS: Unit division: 0

## 2023-06-10 MED ORDER — METOPROLOL TARTRATE 5 MG/5ML IV SOLN: 5.0000 mg | INTRAVENOUS | Status: DC | PRN

## 2023-06-10 MED ORDER — IPRATROPIUM-ALBUTEROL 0.5-2.5 (3) MG/3ML IN SOLN: 3.0000 mL | RESPIRATORY_TRACT | Status: DC | PRN

## 2023-06-10 MED ORDER — HYDRALAZINE HCL 20 MG/ML IJ SOLN: 10.0000 mg | INTRAMUSCULAR | Status: DC | PRN

## 2023-06-10 NOTE — Evaluation (Signed)
 Clinical/Bedside Swallow Evaluation Patient Details  Name: Mark [name] MRN: [id number] Date of Birth: [birth year]  Today's Date: 06/10/2023 Time: SLP Start Time (ACUTE ONLY): 1340 SLP Stop Time (ACUTE ONLY): 1400 SLP Time Calculation (min) (ACUTE ONLY): 20 min  Past Medical History:  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    Lupus anticoagulant disorder (HCC)    Lupus anticoagulant syndrome (HCC)    Multiple closed anterior-posterior compression fractures of pelvis (HCC)    Seizures (HCC)    last seizure 10 years ago approx    Sleep apnea    cpap broken x 1 year per wife    Stroke (HCC) 02/27/2011   Left side weakness   Past Surgical History:  Past Surgical History:  Procedure Laterality Date   blood clot Right    surgical removal   CHOLECYSTECTOMY     CORONARY ANGIOPLASTY  1992   CYSTOGRAM N/A 02/17/2020   Procedure: CYSTOGRAM;  Surgeon: Crist Fat, MD;  Location: [facility];  Service: Urology;  Laterality: N/A;   CYSTOSCOPY WITH RETROGRADE PYELOGRAM, URETEROSCOPY AND STENT PLACEMENT Bilateral 02/17/2020   Procedure: Effie Shy  URETEROSCOPY AND STENT PLACEMENT;  Surgeon: Crist Fat, MD;  Location: [facility];  Service: Urology;  Laterality: Bilateral;   SPLENECTOMY, TOTAL     vertebralplasty     HPI:  Patient is a 78 y.o. male with PMH: HLD, CVA with residual left sided weakness, HLD, CKD-3a, GERD, seizure, CAD, lupus anticoagulant, ITP. He presented to the hospital on 06/09/23 from hemetology-oncology office for management of thrombocytopenia. Patient had shingles last week and was started on  Valtrex but since then has noted progressive petechiae and purpura on body.  SLP swallow evaluation orderded to assess as patient has h/o dysphagia.    Assessment / Plan / Recommendation  Clinical Impression  Patient is not currently presenting with changes in his chronic dysphagia as per this evaluation and discussion with patient and spouse. He has been implementing a chin tuck strategy when drinking liquids since it was recommended back in 2018 following an MBS after he had a stroke. His spouse reported that if he doesn't perform a chin tuck when drinking liquids, he will "get strangled". SLP observed patient with straw sips of thin liquids and he performed chin tuck strategy independently. No overt s/s aspiration observed. SLP reviewed MBS from 2018 and discussed with patient and spouse to continue with chin tuck if it continues to work for him but that MBS showed penetration but not aspiration of thin liquids and so they should not be overly worried of aspiration/choking, etc. SLP not recommending further skilled intervention. SLP Visit Diagnosis: Dysphagia, unspecified (R13.10)    Aspiration Risk  Mild aspiration risk    Diet Recommendation Regular;Thin liquid    Liquid Administration via: Cup;Straw Medication Administration: Other (Comment) (as tolerated) Supervision: Patient able to self feed Compensations: Slow rate;Small sips/bites;Chin tuck Postural Changes: Seated upright at 90 degrees    Other  Recommendations Oral Care Recommendations: Oral care BID    Recommendations for follow up therapy are  one component of a multi-disciplinary discharge planning process, led by the attending physician.  Recommendations may be updated based on patient status, additional functional criteria and insurance authorization.  Follow up Recommendations No SLP follow up      Assistance Recommended at Discharge    Functional Status Assessment Patient has not had a recent decline in their functional  status  Frequency and Duration   N/A         Prognosis   N/A     Swallow Study   General Date of Onset: 06/09/23 HPI: Patient is a 78 y.o. male with PMH: HLD, CVA with residual left sided weakness, HLD, CKD-3a, GERD, seizure, CAD, lupus anticoagulant, ITP. He presented to the hospital on 06/09/23 from hemetology-oncology office for management of thrombocytopenia. Patient had shingles last week and was started on Valtrex but since then has noted progressive petechiae and purpura on body.  SLP swallow evaluation orderded to assess as patient has h/o dysphagia. Type of Study: Bedside Swallow Evaluation Previous Swallow Assessment: remote, BSE, MBS in 2018 Diet Prior to this Study: Regular;Thin liquids (Level 0) Temperature Spikes Noted: No Respiratory Status: Room air History of Recent Intubation: No Behavior/Cognition: Alert;Pleasant mood;Cooperative Oral Cavity Assessment: Within Functional Limits Oral Care Completed by SLP: No Oral Cavity - Dentition: Adequate natural dentition Vision: Functional for self-feeding Self-Feeding Abilities: Able to feed self Patient Positioning: Upright in chair Baseline Vocal Quality: Normal Volitional Cough: Strong Volitional Swallow: Able to elicit    Oral/Motor/Sensory Function Overall Oral Motor/Sensory Function: Within functional limits   Ice Chips     Thin Liquid Thin Liquid: Within functional limits Presentation: Straw;Self Fed Other Comments: patient performs chin tuck with modified independence    Nectar Thick Nectar Thick Liquid: Not tested   Honey Thick Honey Thick Liquid: Not tested   Puree Puree: Not tested   Solid     Solid: Not tested      Angela Nevin, MA, CCC-SLP Speech Therapy

## 2023-06-10 NOTE — Progress Notes (Signed)
 PROGRESS NOTE    Mark [name]  ZOX:[id number] DOB: [birth year] DOA: 06/09/2023 PCP: Laurann Montana, MD    Brief Narrative:  78 year old with history of ITP, lupus anticoagulant, CAD, HTN, CVA with residual left-sided hemiparesis, HLD, CKD stage III, GERD, seizure presented from heme-onc office for management of 3 years thrombocytopenia.  Patient had shingles last week and was started on Valtrex but since then has noted progressive petechiae and purpura on body.  Upon admission platelets noted to be less than 5000.  Started on steroids, Nplate and IVIG per hematology.   Assessment & Plan:  Principal Problem:   Idiopathic thrombocytopenic purpura (ITP) (HCC) Active Problems:   Thrombocytopenia (HCC)   Acute exacerbation of ITP - Platelets less than 5000.  Improving this morning.  Continue IVIG, Decadron and Nplate.  Management per hematology.  Dysphagia - Reported by family and the patient. speech and swallow consulted.  Per family these had appeared on 2/14.  Shingles - Crusted rash over his back.  No need for droplet precaution at this time.  Essential hypertension - Norvasc.  IV as needed  History of lupus anticoagulant History of CVA, hyperlipidemia - Continue aspirin and statin.  Holding Xarelto  CKD stage IIIa - Creatinine around baseline 1.34  History of seizure - Keppra  Anxiety/depression - Continue bupropion and sertraline   DVT prophylaxis: SCDs Start: 06/09/23 1803    Code Status: Limited: Do not attempt resuscitation (DNR) -DNR-LIMITED -Do Not Intubate/DNI  Family Communication:  family at bedside Status is: Inpatient Remains inpatient appropriate because: Ongoing ITP management    Subjective:  No complaints.   Examination:  General exam: Appears calm and comfortable  Respiratory system: Clear to auscultation. Respiratory effort normal. Cardiovascular system: S1 & S2 heard, RRR. No JVD, murmurs, rubs, gallops or clicks. No pedal  edema. Gastrointestinal system: Abdomen is nondistended, soft and nontender. No organomegaly or masses felt. Normal bowel sounds heard. Central nervous system: Alert and oriented. No focal neurological deficits. Extremities: Symmetric 5 x 5 power. Skin: Multiple areas of petechia noted Psychiatry: Judgement and insight appear normal. Mood & affect appropriate.                Diet Orders (From admission, onward)     Start     Ordered   06/09/23 1829  Diet vegetarian Room service appropriate? Yes; Fluid consistency: Thin  Diet effective now       Question Answer Comment  Room service appropriate? Yes   Fluid consistency: Thin      06/09/23 1829            Objective: Vitals:   06/10/23 0652 06/10/23 0700 06/10/23 1000 06/10/23 1117  BP:  139/84 (!) 144/84 (!) 136/97  Pulse:  87 87 84  Resp:  18 20 19   Temp: 97.8 F (36.6 C)   97.6 F (36.4 C)  TempSrc: Oral   Oral  SpO2:  93% 96% 96%  Weight:      Height:        Intake/Output Summary (Last 24 hours) at 06/10/2023 1210 Last data filed at 06/10/2023 8119 Gross per 24 hour  Intake 1130 ml  Output 1850 ml  Net -720 ml   Filed Weights   06/09/23 1450  Weight: 79.4 kg    Scheduled Meds:  sodium chloride   Intravenous Once   amLODipine  10 mg Oral QHS   buPROPion  150 mg Oral Daily   cholecalciferol  2,000 Units Oral QHS   dexamethasone  40 mg  Intravenous Q24H   HYDROcodone-acetaminophen  1 tablet Oral QHS   levETIRAcetam  1,500 mg Oral BID   pantoprazole (PROTONIX) IV  40 mg Intravenous Q12H   rosuvastatin  40 mg Oral QHS   sertraline  200 mg Oral Daily   Continuous Infusions:  Immune Globulin 10% Stopped (06/10/23 1610)    Nutritional status     Body mass index is 23.73 kg/m.  Data Reviewed:   CBC: Recent Labs  Lab 06/09/23 1146 06/09/23 1545 06/10/23 0634  WBC 10.3 11.3* 5.7  NEUTROABS 4.7 5.4 4.4  HGB 13.6 12.7* 12.0*  HCT 41.6 40.7 36.3*  MCV 90.4 92.3 90.5  PLT <5* <5* 13*    Basic Metabolic Panel: Recent Labs  Lab 06/09/23 1146 06/09/23 1545 06/10/23 0634  NA 141 137 132*  K 4.1 4.0 4.0  CL 106 108 103  CO2 26 21* 21*  GLUCOSE 102* 97 180*  BUN 24* 29* 26*  CREATININE 1.32* 1.31* 0.82  CALCIUM 9.8 9.1 8.9   GFR: Estimated Creatinine Clearance: 82.8 mL/min (by C-G formula based on SCr of 0.82 mg/dL). Liver Function Tests: Recent Labs  Lab 06/09/23 1146 06/09/23 1545  AST 18 21  ALT 22 25  ALKPHOS 80 70  BILITOT 0.4 0.4  PROT 7.0 6.9  ALBUMIN 4.5 3.7   No results for input(s): "LIPASE", "AMYLASE" in the last 168 hours. No results for input(s): "AMMONIA" in the last 168 hours. Coagulation Profile: Recent Labs  Lab 06/09/23 1545  INR 1.1   Cardiac Enzymes: No results for input(s): "CKTOTAL", "CKMB", "CKMBINDEX", "TROPONINI" in the last 168 hours. BNP (last 3 results) No results for input(s): "PROBNP" in the last 8760 hours. HbA1C: No results for input(s): "HGBA1C" in the last 72 hours. CBG: No results for input(s): "GLUCAP" in the last 168 hours. Lipid Profile: No results for input(s): "CHOL", "HDL", "LDLCALC", "TRIG", "CHOLHDL", "LDLDIRECT" in the last 72 hours. Thyroid Function Tests: No results for input(s): "TSH", "T4TOTAL", "FREET4", "T3FREE", "THYROIDAB" in the last 72 hours. Anemia Panel: No results for input(s): "VITAMINB12", "FOLATE", "FERRITIN", "TIBC", "IRON", "RETICCTPCT" in the last 72 hours. Sepsis Labs: No results for input(s): "PROCALCITON", "LATICACIDVEN" in the last 168 hours.  No results found for this or any previous visit (from the past 240 hours).       Radiology Studies: No results found.         LOS: 1 day   Time spent= 35 mins    Miguel Rota, MD [facility]  If 7PM-7AM, please contact night-coverage  06/10/2023, 12:10 PM

## 2023-06-10 NOTE — Evaluation (Signed)
 Physical Therapy Evaluation Patient Details Name: Mark [name] MRN: [id number] DOB: 1946/04/06 Today's Date: 06/10/2023  History of Present Illness  Pt is a 78 yo male admitted with Idiopathic thrombocytopenic purpura, Epistaxis; of note, diagnosed with shingles last Friday and started on Valtrex.  PMH:ITP, lupus anticoagulant, CAD, HTN, CVA with residual left-sided hemiparesis, HLD, CKD stage III, GERD, seizure  Clinical Impression  Pt admitted with above diagnosis. Pt reports hemiwalker use for in home ambulation at baseline, spouse uses gait belt to assist if needed, spouse present 24/7 for supv and assist as needed, active with HHPT/HHOT. On eval, pt in recliner upon arrival, CGA to power up from recliner. Pt ready to self cath so amb to restroom first, minor LOB to R needing assist to recover but pt declines LOB, noted to have significant R lateral lean/weight-shifting to allow L circumduction compensation gait pattern. After restroom, pt fatigued, requests to amb back to bed; able to hook LLE under RLE to lift into bed without assist from therapist, using bedrail for added leverage. Recommend resume HHPT services; no DME needs as pt is well equipped at home. Pt currently with functional limitations due to the deficits listed below (see PT Problem List). Pt will benefit from acute skilled PT to increase their independence and safety with mobility to allow discharge.           If plan is discharge home, recommend the following: A little help with walking and/or transfers;A little help with bathing/dressing/bathroom;Assistance with cooking/housework;Assist for transportation;Help with stairs or ramp for entrance   Can travel by private vehicle        Equipment Recommendations None recommended by PT  Recommendations for Other Services       Functional Status Assessment Patient has had a recent decline in their functional status and demonstrates the ability to make significant improvements  in function in a reasonable and predictable amount of time.     Precautions / Restrictions Precautions Precautions: Fall Restrictions Weight Bearing Restrictions Per Provider Order: No      Mobility  Bed Mobility Overal bed mobility: Needs Assistance Bed Mobility: Sit to Supine       Sit to supine: Supervision, Used rails   General bed mobility comments: uses RLE to hook LLE and lift back into bed, uses bedrails, supv for safety    Transfers Overall transfer level: Needs assistance Equipment used: Hemi-walker Transfers: Sit to/from Stand Sit to Stand: Contact guard assist           General transfer comment: CGA for steadying to power up from recliner and toilet with hemiwalker    Ambulation/Gait Ambulation/Gait assistance: Contact guard assist, Min assist Gait Distance (Feet): 20 Feet (x2) Assistive device: Hemi-walker Gait Pattern/deviations: Step-to pattern Gait velocity: decreased     General Gait Details: step to gait pattern, significant R trunk sway with LLE circumduction compensation pattern, initial LOB R with min A from therapist to recover though pt reports no LOB, decreasead endurance limiting distance  Stairs            Wheelchair Mobility     Tilt Bed    Modified Rankin (Stroke Patients Only)       Balance Overall balance assessment: Needs assistance         Standing balance support: Single extremity supported, Reliant on assistive device for balance, During functional activity Standing balance-Leahy Scale: Poor  Pertinent Vitals/Pain Pain Assessment Pain Assessment: No/denies pain    Home Living Family/patient expects to be discharged to:: Private residence Living Arrangements: Spouse/significant other Available Help at Discharge: Family;Available 24 hours/day Type of Home: House Home Access: Stairs to enter Entrance Stairs-Rails: Can reach both Entrance Stairs-Number of Steps: 4  to porch then 1 step up into house   Home Layout: One level Home Equipment: Shower seat;BSC/3in1;Cane - single point;Grab bars - toilet;Hand held shower head;Grab bars - tub/shower;Adaptive equipment;Wheelchair - Fish farm manager Comments: railings on bed    Prior Function Prior Level of Function : Needs assist  Cognitive Assist : Mobility (cognitive);ADLs (cognitive) Mobility (Cognitive): Intermittent cues ADLs (Cognitive): Intermittent cues Physical Assist : Mobility (physical);ADLs (physical) Mobility (physical): Transfers;Gait;Stairs ADLs (physical): Bathing;IADLs Mobility Comments: wife utilizes gait belt as needed, pt amb with hemi walker in the home, active with HHPT, spouse reports doesn't go out much becuase stairs are difficult ADLs Comments: wife assists with bathing, active wtih HHOT     Extremity/Trunk Assessment   Upper Extremity Assessment Upper Extremity Assessment: Defer to OT evaluation LUE:  (Pt with baseline L hemiplegia with increased tone and decreased ROM and functional use) LUE Coordination: decreased fine motor;decreased gross motor    Lower Extremity Assessment Lower Extremity Assessment: LLE deficits/detail LLE Deficits / Details: baseline L hemiplegia, pt maintains L LE in knee extension and drop foot noted LLE Coordination: decreased fine motor;decreased gross motor    Cervical / Trunk Assessment Cervical / Trunk Assessment: Kyphotic  Communication   Communication Communication: No apparent difficulties    Cognition Arousal: Alert Behavior During Therapy: WFL for tasks assessed/performed                             Following commands: Intact       Cueing Cueing Techniques: Verbal cues     General Comments General comments (skin integrity, edema, etc.): s/s of ITP on skin    Exercises     Assessment/Plan    PT Assessment Patient needs continued PT services  PT Problem List Decreased strength;Decreased  range of motion;Decreased activity tolerance;Decreased balance;Decreased mobility;Decreased coordination;Impaired tone       PT Treatment Interventions DME instruction;Gait training;Stair training;Functional mobility training;Therapeutic activities;Therapeutic exercise;Balance training;Neuromuscular re-education;Patient/family education    PT Goals (Current goals can be found in the Care Plan section)  Acute Rehab PT Goals Patient Stated Goal: return home PT Goal Formulation: With patient/family Time For Goal Achievement: 06/24/23 Potential to Achieve Goals: Good    Frequency Min 1X/week     Co-evaluation               AM-PAC PT "6 Clicks" Mobility  Outcome Measure Help needed turning from your back to your side while in a flat bed without using bedrails?: A Little Help needed moving from lying on your back to sitting on the side of a flat bed without using bedrails?: A Little Help needed moving to and from a bed to a chair (including a wheelchair)?: A Little Help needed standing up from a chair using your arms (e.g., wheelchair or bedside chair)?: A Little Help needed to walk in hospital room?: A Little Help needed climbing 3-5 steps with a railing? : A Lot 6 Click Score: 17    End of Session Equipment Utilized During Treatment: Gait belt Activity Tolerance: Patient tolerated treatment well;Patient limited by fatigue Patient left: in bed;with call bell/phone within reach;with family/visitor present Nurse Communication: Mobility status PT  Visit Diagnosis: Unsteadiness on feet (R26.81);Muscle weakness (generalized) (M62.81);Hemiplegia and hemiparesis Hemiplegia - Right/Left: Left    Time: 1357-1430 PT Time Calculation (min) (ACUTE ONLY): 33 min   Charges:   PT Evaluation $PT Eval Moderate Complexity: 1 Mod PT Treatments $Gait Training: 8-22 mins PT General Charges $$ ACUTE PT VISIT: 1 Visit         Tori Stormie Ventola PT, DPT 06/10/23, 3:02 PM

## 2023-06-10 NOTE — Progress Notes (Signed)
 Unfortunately, [name] is still in the ER.  He is awaiting a bed in the hospital.  No labs have been done on him this morning.  I assume that he got the IVIG and Nplate and Decadron yesterday.  He has had no obvious bleeding.  He still has a lot of the petechiae and some ecchymoses.  There is no nausea or vomiting.  He has had no fever.  He has had no obvious change in bowel or bladder habits.  I know the hospital is incredibly busy.  Hopefully, there will be a room for him today.  His vital signs are all stable.  We will go ahead and continue him on IVIG today.  He will get another dose of Decadron today.  I would have to believe that he will respond nicely to the interventions that we do for his relapsed ITP.  Again, we have not yet used Rituxan had.  This is always a possibility.  However, I think that we do have a little bit of flexibility with how we treat this.  Christin Bach, MD  Psalm 56:3

## 2023-06-10 NOTE — Evaluation (Signed)
 Occupational Therapy Evaluation Patient Details Name: Mark [name] MRN: [id number] DOB: [birth year] Today's Date: 06/10/2023   History of Present Illness   Pt is a 78 y.o. male with medical history significant for ITP, lupus anticoagulant who presented to the heme-onc office for evaluation and management of severe thrombocytopenia.  Patient reports she was diagnosed with shingles last Friday and started on Valtrex.  Since then, he has noticed progressive petechia and purpura on his body. TP, lupus anticoagulant, CAD, HTN, CVA with residual left-sided weakness, HLD, CKD 3A, GERD and seizure     Clinical Impressions PTA pt lives with wife and was receiving minimal assistance with hemi walker for household mobility and bathing shower level with AE since CVA with L hemiplegia several years ago. Pt was receiving HH OT and OT services up until hospitalization. Pt's wife is having ramp installed but family currently assists pt on steps to enter home. Pt presents now with decreased higher level STM and safety, strength and activity tolerance and balance as well as baseline L sided hemiplegia. Pt would benefit from continued OT while in Acute care hospital. Recommending home with assistance and S from family with [facility] services.      If plan is discharge home, recommend the following:   A lot of help with walking and/or transfers;A lot of help with bathing/dressing/bathroom;Assistance with cooking/housework;Assistance with feeding;Direct supervision/assist for medications management;Direct supervision/assist for financial management;Assist for transportation;Help with stairs or ramp for entrance;Supervision due to cognitive status     Functional Status Assessment   Patient has had a recent decline in their functional status and demonstrates the ability to make significant improvements in function in a reasonable and predictable amount of time.     Equipment Recommendations   None recommended by  OT     Recommendations for Other Services         Precautions/Restrictions   Precautions Precautions: Fall Restrictions Weight Bearing Restrictions Per Provider Order: No     Mobility Bed Mobility Overal bed mobility: Needs Assistance Bed Mobility: Rolling, Supine to Sit Rolling: Supervision   Supine to sit: Contact guard     General bed mobility comments: min cues and increased time to weight shift and scoot  to EOB    Transfers Overall transfer level: Needs assistance Equipment used:  (use of hemi walker rec as pt uses at home) Transfers: Bed to chair/wheelchair/BSC, Sit to/from Stand Sit to Stand: Min assist           General transfer comment: ensured L foot placement adequate with OT's foot aligning      Balance Overall balance assessment: Needs assistance Sitting-balance support: Feet supported Sitting balance-Leahy Scale: Fair   Postural control: Posterior lean Standing balance support: Single extremity supported, Reliant on assistive device for balance Standing balance-Leahy Scale: Poor                             ADL either performed or assessed with clinical judgement   ADL Overall ADL's : Needs assistance/impaired Eating/Feeding: Set up Eating/Feeding Details (indicate cue type and reason): one handed techniques Grooming: Wash/dry hands;Set up   Upper Body Bathing: Minimal assistance;Sitting   Lower Body Bathing: Minimal assistance;Sit to/from stand;Sitting/lateral leans;Cueing for sequencing   Upper Body Dressing : Minimal assistance Upper Body Dressing Details (indicate cue type and reason): using hemi techniques Lower Body Dressing: Minimal assistance;Bed level;Sit to/from stand;Sitting/lateral leans Lower Body Dressing Details (indicate cue type and reason): one handed techniques  observed, support for sit to stand due to weakness L side Toilet Transfer: Moderate assistance   Toileting- Clothing Manipulation and Hygiene:  Moderate assistance   Tub/ Shower Transfer: Moderate assistance   Functional mobility during ADLs: Minimal assistance General ADL Comments: min A to R side for transfer to recliner, tends to lean posteriorly with figure 4 for socks     Vision Baseline Vision/History: 0 No visual deficits Ability to See in Adequate Light: 0 Adequate Patient Visual Report: No change from baseline Vision Assessment?: No apparent visual deficits     Perception Perception: Not tested       Praxis Praxis: Not tested       Pertinent Vitals/Pain Pain Assessment Pain Assessment: No/denies pain     Extremity/Trunk Assessment Upper Extremity Assessment Upper Extremity Assessment: Right hand dominant;LUE deficits/detail LUE:  (Pt with baseline L hemiplegia with increased tone and decreased ROM and functional use) LUE Coordination: decreased fine motor;decreased gross motor   Lower Extremity Assessment Lower Extremity Assessment: Defer to PT evaluation   Cervical / Trunk Assessment Cervical / Trunk Assessment:  (mild forward head and L sh internal rotation and scap protraction)   Communication Communication Communication: No apparent difficulties   Cognition Arousal: Alert Behavior During Therapy: WFL for tasks assessed/performed Cognition: History of cognitive impairments             OT - Cognition Comments: mild STM deficits as per wife                 Following commands: Intact       Cueing  General Comments   Cueing Techniques: Verbal cues;Other (comments) (mildly impulsive to move to recliner and wife reports that is a typical baseline behavior)      Exercises     Shoulder Instructions      Home Living Family/patient expects to be discharged to:: Private residence Living Arrangements: Spouse/significant other   Type of Home: House Home Access: Stairs to enter Secretary/administrator of Steps: 4 Entrance Stairs-Rails: Can reach both Home Layout: One level      Bathroom Shower/Tub: IT trainer: Handicapped height Bathroom Accessibility: Yes How Accessible: Accessible via walker Home Equipment: Shower seat;BSC/3in1;Cane - single point;Grab bars - toilet;Hand held shower head;Grab bars - tub/shower;Adaptive equipment;Wheelchair - Sport and exercise psychologist Comments: railings on bed      Prior Functioning/Environment Prior Level of Function : Needs assist  Cognitive Assist : Mobility (cognitive);ADLs (cognitive) Mobility (Cognitive): Intermittent cues ADLs (Cognitive): Intermittent cues Physical Assist : Mobility (physical);ADLs (physical) Mobility (physical): Transfers;Gait;Stairs ADLs (physical): Bathing;IADLs Mobility Comments: wife followed with pt amb with hemi walker ADLs Comments: wife assisted with bathing    OT Problem List: Decreased strength;Decreased range of motion;Decreased activity tolerance;Impaired balance (sitting and/or standing);Decreased coordination;Decreased safety awareness;Decreased knowledge of use of DME or AE;Cardiopulmonary status limiting activity;Impaired sensation;Impaired tone;Impaired UE functional use   OT Treatment/Interventions: Self-care/ADL training;Therapeutic exercise;Neuromuscular education;Energy conservation;Manual therapy;DME and/or AE instruction;Therapeutic activities;Patient/family education;Balance training      OT Goals(Current goals can be found in the care plan section)   Acute Rehab OT Goals Patient Stated Goal: to go home OT Goal Formulation: With patient/family Time For Goal Achievement: 06/24/23 Potential to Achieve Goals: Good   OT Frequency:  Min 1X/week       AM-PAC OT "6 Clicks" Daily Activity     Outcome Measure Help from another person eating meals?: A Little Help from another person taking care of personal grooming?: A Little Help from another  person toileting, which includes using toliet, bedpan, or urinal?: A Lot Help from  another person bathing (including washing, rinsing, drying)?: A Lot Help from another person to put on and taking off regular upper body clothing?: A Little Help from another person to put on and taking off regular lower body clothing?: A Lot 6 Click Score: 15   End of Session Equipment Utilized During Treatment: Gait belt Nurse Communication: Mobility status;Weight bearing status;Precautions  Activity Tolerance: Patient limited by fatigue Patient left: in chair;with family/visitor present;with call bell/phone within reach  OT Visit Diagnosis: Unsteadiness on feet (R26.81);Other abnormalities of gait and mobility (R26.89);Muscle weakness (generalized) (M62.81);Other symptoms and signs involving the nervous system (R29.898);Hemiplegia and hemiparesis Hemiplegia - Right/Left: Left Hemiplegia - dominant/non-dominant: Non-Dominant Hemiplegia - caused by: Other cerebrovascular disease                Time: 1213-1245 OT Time Calculation (min): 32 min Charges:  OT General Charges $OT Visit: 1 Visit OT Evaluation $OT Eval Moderate Complexity: 1 Mod OT Treatments $Self Care/Home Management : 8-22 mins Jeraldin Fesler OT/L Acute Rehabilitation Department  [phone] 06/10/2023, 1:45 PM

## 2023-06-10 NOTE — Hospital Course (Addendum)
 Brief Narrative:  78 year old with history of ITP, lupus anticoagulant, CAD, HTN, CVA with residual left-sided hemiparesis, HLD, CKD stage III, GERD, seizure presented from heme-onc office for management of 3 years thrombocytopenia.  Patient had shingles last week and was started on Valtrex but since then has noted progressive petechiae and purpura on body.  Upon admission platelets noted to be less than 5000.  Started on steroids, Nplate and IVIG per hematology. This morning platelets have recovered doing well.  Okay for discharge with outpatient follow-up  Assessment & Plan:  Principal Problem:   Idiopathic thrombocytopenic purpura (ITP) (HCC) Active Problems:   Thrombocytopenia (HCC)   Acute exacerbation of ITP - Platelets <5k, Now improving 200K.  No further need for IVIG, Decadron and Nplate.  Ultrasound spleen negative. - Okay to resume aspirin and Xarelto  Dysphagia - Reported by family and the patient. speech and swallow - Regular.  Per family these had appeared on 2/14.  Shingles - Crusted rash over his back.  No need for droplet precaution at this time.  Essential hypertension - Norvasc.   History of lupus anticoagulant History of CVA, hyperlipidemia - ContinueASA/ Statin and xarelto  CKD stage IIIa - Creatinine around baseline 1.34  History of seizure - Keppra  Anxiety/depression - Continue bupropion and sertraline   DVT prophylaxis: SCDs Start: 06/09/23 1803    Code Status: Limited: Do not attempt resuscitation (DNR) -DNR-LIMITED -Do Not Intubate/DNI  Family Communication: None at bedside Status is: Inpatient Remains inpatient appropriate because: DC today    Subjective: No complaints.  No signs of bleeding.   Examination:  General exam: Appears calm and comfortable  Respiratory system: Clear to auscultation. Respiratory effort normal. Cardiovascular system: S1 & S2 heard, RRR. No JVD, murmurs, rubs, gallops or clicks. No pedal edema. Gastrointestinal  system: Abdomen is nondistended, soft and nontender. No organomegaly or masses felt. Normal bowel sounds heard. Central nervous system: Alert and oriented. No focal neurological deficits. Extremities: Symmetric 5 x 5 power. Skin: Multiple areas of petechia noted Psychiatry: Judgement and insight appear normal. Mood & affect appropriate.

## 2023-06-11 ENCOUNTER — Encounter (HOSPITAL_COMMUNITY): Payer: Self-pay | Admitting: Student

## 2023-06-11 ENCOUNTER — Inpatient Hospital Stay (HOSPITAL_COMMUNITY): Payer: Medicare HMO

## 2023-06-11 DIAGNOSIS — D693 Immune thrombocytopenic purpura: Secondary | ICD-10-CM | POA: Diagnosis not present

## 2023-06-11 LAB — BASIC METABOLIC PANEL
Anion gap: 8 (ref 5–15)
BUN: 32 mg/dL — ABNORMAL HIGH (ref 8–23)
CO2: 22 mmol/L (ref 22–32)
Calcium: 8.7 mg/dL — ABNORMAL LOW (ref 8.9–10.3)
Chloride: 103 mmol/L (ref 98–111)
Creatinine, Ser: 1.16 mg/dL (ref 0.61–1.24)
GFR, Estimated: >60 mL/min (ref >60)
Glucose, Bld: 193 mg/dL — ABNORMAL HIGH (ref 70–99)
Potassium: 4.1 mmol/L (ref 3.5–5.1)
Sodium: 133 mmol/L — ABNORMAL LOW (ref 135–145)

## 2023-06-11 LAB — CBC WITH DIFFERENTIAL/PLATELET
Abs Immature Granulocytes: 0.04 10*3/uL (ref 0.00–0.07)
Basophils Absolute: 0 10*3/uL (ref 0.0–0.1)
Basophils Relative: 0 %
Eosinophils Absolute: 0 10*3/uL (ref 0.0–0.5)
Eosinophils Relative: 0 %
HCT: 35 % — ABNORMAL LOW (ref 39.0–52.0)
Hemoglobin: 11.3 g/dL — ABNORMAL LOW (ref 13.0–17.0)
Immature Granulocytes: 1 %
Lymphocytes Relative: 17 %
Lymphs Abs: 1.3 10*3/uL (ref 0.7–4.0)
MCH: 29.7 pg (ref 26.0–34.0)
MCHC: 32.3 g/dL (ref 30.0–36.0)
MCV: 91.9 fL (ref 80.0–100.0)
Monocytes Absolute: 0.4 10*3/uL (ref 0.1–1.0)
Monocytes Relative: 5 %
Neutro Abs: 6.2 10*3/uL (ref 1.7–7.7)
Neutrophils Relative %: 77 %
Platelets: 18 10*3/uL — CL (ref 150–400)
RBC: 3.81 MIL/uL — ABNORMAL LOW (ref 4.22–5.81)
RDW: 14.6 % (ref 11.5–15.5)
WBC: 8 10*3/uL (ref 4.0–10.5)
nRBC: 0.5 % — ABNORMAL HIGH (ref 0.0–0.2)

## 2023-06-11 LAB — PHOSPHORUS: Phosphorus: 2.4 mg/dL — ABNORMAL LOW (ref 2.5–4.6)

## 2023-06-11 LAB — MAGNESIUM: Magnesium: 2.2 mg/dL (ref 1.7–2.4)

## 2023-06-11 NOTE — Progress Notes (Signed)
 He now is up on 4 E.  He is very appreciative of the great care that he is getting.  There is no labs back yet.  Yesterday, his platelet count was 13,000.  He has had no bleeding.  He still has ecchymoses.  They appear to be may be slightly less.  He is eating well.  I am sure that the steroids are doing this.  He is getting IVIG this morning.  I would assume this is his second dose of IVIG.  He has had no fever.  He has had no problems with bowels or bladder.  I think he says he self catheterizes himself.  He has had no rashes.  He has had no mouth sores.   His vital signs are temperature of 97.3.  Pulse 68.  Blood pressure 131/78.  His lungs sound clear bilaterally.  Cardiac exam regular rate and rhythm.  Abdomen is soft.  Bowel sounds are present.  There is no fluid wave.  There is no palpable liver or spleen tip.  Extremities shows the weakness on the left side.  He has no problems on the right side.  Skin exam does show scattered ecchymoses and petechia.  Neurological exam shows the consequences of his stroke with left-sided weakness.  I will see what his CBC shows.  I would like to hope that his platelet count is above 40,000 now.  Hopefully, if his platelet count is coming up nicely, we can let him go home tomorrow, or Friday at the latest.  I do not think it would hurt to get a ultrasound of his abdomen to make sure there is nothing going on with a secondary spleen.   Christin Bach, MD  Proverbs 17:17

## 2023-06-11 NOTE — TOC Initial Note (Signed)
 Transition of Care [facility]) - Initial/Assessment Note   Patient Details  Name: Mark [name] MRN: [id number] Date of Birth: 07/06/1945  Transition of Care [facility]) CM/SW Contact:    Ewing Schlein, LCSW Phone Number: 06/11/2023, 12:57 PM  Clinical Narrative: PT/OT evaluations recommended HH. CSW met with patient and he reported he is already active with [facility] and would like to resume with the same agency. Patient requested that CSW also follow up with his wife. CSW spoke with wife, Mark [name], regarding HH recommendations. Wife confirmed patient is active with [facility]. HH orders requested. CSW reached out to Rudolph with [facility] regarding resuming HH and orders being requested.               Barriers to Discharge: Continued Medical Work up  Patient Goals and CMS Choice Patient states their goals for this hospitalization and ongoing recovery are:: Resume HH with [facility] CMS Medicare.gov Compare Post Acute Care list provided to:: Patient Choice offered to / list presented to : Patient  Expected Discharge Plan and Services In-house Referral: Clinical Social Work Post Acute Care Choice: Home Health Living arrangements for the past 2 months: Single Family Home            DME Arranged: N/A DME Agency: NA HH Agency: Other - See comment ([facility]) Date HH Agency Contacted: 06/11/23 Time HH Agency Contacted: 1249 Representative spoke with at [facility] Agency: Kasie  Prior Living Arrangements/Services Living arrangements for the past 2 months: Single Family Home Lives with:: Spouse Patient language and need for interpreter reviewed:: Yes Do you feel safe going back to the place where you live?: Yes      Need for Family Participation in Patient Care: No (Comment) Care giver support system in place?: Yes (comment) Current home services: Home OT, Home PT Criminal Activity/Legal Involvement Pertinent to Current Situation/Hospitalization: No - Comment as needed  Activities of Daily Living ADL Screening  (condition at time of admission) Independently performs ADLs?: No Does the patient have a NEW difficulty with bathing/dressing/toileting/self-feeding that is expected to last >3 days?: No Does the patient have a NEW difficulty with getting in/out of bed, walking, or climbing stairs that is expected to last >3 days?: No Does the patient have a NEW difficulty with communication that is expected to last >3 days?: No Is the patient deaf or have difficulty hearing?: No Does the patient have difficulty seeing, even when wearing glasses/contacts?: No Does the patient have difficulty concentrating, remembering, or making decisions?: No  Permission Sought/Granted Permission sought to share information with : Other (comment) Permission granted to share information with : Yes, Verbal Permission Granted Permission granted to share info w AGENCY: [facility]  Emotional Assessment Appearance:: Appears stated age Attitude/Demeanor/Rapport: Engaged Affect (typically observed): Accepting Orientation: : Oriented to Self, Oriented to Place, Oriented to  Time, Oriented to Situation Alcohol / Substance Use: Not Applicable Psych Involvement: No (comment)  Admission diagnosis:  Epistaxis [R04.0] Thrombocytopenia (HCC) [D69.6] Idiopathic thrombocytopenic purpura (HCC) [D69.3] Idiopathic thrombocytopenic purpura (ITP) (HCC) [D69.3] Patient Active Problem List   Diagnosis Date Noted   Thrombocytopenia (HCC) 06/10/2023   Idiopathic thrombocytopenic purpura (ITP) (HCC) 06/09/2023   Acute lower UTI 07/04/2020   CKD (chronic kidney disease), stage III (HCC) 07/01/2020   Neurogenic bladder 07/01/2020   Pulmonary nodule 10/24/2016   Acute hypoxemic respiratory failure (HCC) 10/21/2016   Sepsis (HCC) 10/21/2016   DVT (deep venous thrombosis) (HCC) 10/12/2016   Supratherapeutic INR 10/12/2016   Acute deep vein  thrombosis (DVT) of popliteal vein of right lower extremity (HCC) 10/12/2016   AKI (acute kidney injury)  (HCC) 10/12/2016   Fever 10/12/2016   Leukocytosis 10/12/2016   Bacteriuria 10/12/2016   Deep vein thrombosis (DVT) (HCC) 03/02/2015   Lupus anticoagulant disorder (HCC) 03/02/2015   Ischemic stroke (HCC) 10/04/2014   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   Deep vein thrombosis (HCC) 10/04/2014   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 [place] [address] Phone: [phone] Fax: [phone]  Social Drivers of Health (SDOH) Social History: SDOH Screenings   Food Insecurity: No Food Insecurity (06/10/2023)  Housing: Low Risk  (06/10/2023)  Transportation Needs: No Transportation Needs (06/10/2023)  Utilities: Not At Risk (06/10/2023)  Social Connections: Moderately Integrated (06/10/2023)  Tobacco Use: Medium Risk (06/09/2023)   SDOH Interventions:    Readmission Risk Interventions     No data to display

## 2023-06-11 NOTE — Progress Notes (Signed)
 PROGRESS NOTE    NATION CRADLE  UJW:[id number] DOB: 30-Dec-1945 DOA: 06/09/2023 PCP: Laurann Montana, MD    Brief Narrative:  78 year old with history of ITP, lupus anticoagulant, CAD, HTN, CVA with residual left-sided hemiparesis, HLD, CKD stage III, GERD, seizure presented from heme-onc office for management of 3 years thrombocytopenia.  Patient had shingles last week and was started on Valtrex but since then has noted progressive petechiae and purpura on body.  Upon admission platelets noted to be less than 5000.  Started on steroids, Nplate and IVIG per hematology.   Assessment & Plan:  Principal Problem:   Idiopathic thrombocytopenic purpura (ITP) (HCC) Active Problems:   Thrombocytopenia (HCC)   Acute exacerbation of ITP - Platelets <5k, Now improving 18K.  Improving this morning.  Continue IVIG, Decadron and Nplate.  Management per hematology. Spleen US  Dysphagia - Reported by family and the patient. speech and swallow consulted.  Per family these had appeared on 2/14.  Shingles - Crusted rash over his back.  No need for droplet precaution at this time.  Essential hypertension - Norvasc.  IV as needed  History of lupus anticoagulant History of CVA, hyperlipidemia - Continue aspirin and statin.  Holding Xarelto  CKD stage IIIa - Creatinine around baseline 1.34  History of seizure - Keppra  Anxiety/depression - Continue bupropion and sertraline   DVT prophylaxis: SCDs Start: 06/09/23 1803    Code Status: Limited: Do not attempt resuscitation (DNR) -DNR-LIMITED -Do Not Intubate/DNI  Family Communication:  family at bedside Status is: Inpatient Remains inpatient appropriate because: Ongoing ITP management    Subjective:  No complaints.  Doing ok  Examination:  General exam: Appears calm and comfortable  Respiratory system: Clear to auscultation. Respiratory effort normal. Cardiovascular system: S1 & S2 heard, RRR. No JVD, murmurs, rubs, gallops or  clicks. No pedal edema. Gastrointestinal system: Abdomen is nondistended, soft and nontender. No organomegaly or masses felt. Normal bowel sounds heard. Central nervous system: Alert and oriented. No focal neurological deficits. Extremities: Symmetric 5 x 5 power. Skin: Multiple areas of petechia noted Psychiatry: Judgement and insight appear normal. Mood & affect appropriate.                Diet Orders (From admission, onward)     Start     Ordered   06/09/23 1829  Diet vegetarian Room service appropriate? Yes; Fluid consistency: Thin  Diet effective now       Question Answer Comment  Room service appropriate? Yes   Fluid consistency: Thin      06/09/23 1829            Objective: Vitals:   06/11/23 0828 06/11/23 0844 06/11/23 0938 06/11/23 1133  BP: 133/81 (!) 142/78 (!) 145/85 (!) 146/82  Pulse: 84 80 80 78  Resp: 16 18 20 18   Temp: (!) 97.5 F (36.4 C) (!) 97.4 F (36.3 C) 97.7 F (36.5 C) 98.5 F (36.9 C)  TempSrc: Axillary Axillary Oral Oral  SpO2: 96% 97% 97% 97%  Weight:      Height:        Intake/Output Summary (Last 24 hours) at 06/11/2023 1246 Last data filed at 06/11/2023 1133 Gross per 24 hour  Intake 760 ml  Output 2850 ml  Net -2090 ml   Filed Weights   06/09/23 1450  Weight: 79.4 kg    Scheduled Meds:  sodium chloride   Intravenous Once   amLODipine  10 mg Oral QHS   buPROPion  150 mg Oral Daily  cholecalciferol  2,000 Units Oral QHS   dexamethasone  40 mg Intravenous Q24H   HYDROcodone-acetaminophen  1 tablet Oral QHS   levETIRAcetam  1,500 mg Oral BID   pantoprazole (PROTONIX) IV  40 mg Intravenous Q12H   rosuvastatin  40 mg Oral QHS   sertraline  200 mg Oral Daily   Continuous Infusions:  Nutritional status     Body mass index is 23.73 kg/m.  Data Reviewed:   CBC: Recent Labs  Lab 06/09/23 1146 06/09/23 1545 06/10/23 0634 06/11/23 0527  WBC 10.3 11.3* 5.7 8.0  NEUTROABS 4.7 5.4 4.4 6.2  HGB 13.6 12.7*  12.0* 11.3*  HCT 41.6 40.7 36.3* 35.0*  MCV 90.4 92.3 90.5 91.9  PLT <5* <5* 13* 18*   Basic Metabolic Panel: Recent Labs  Lab 06/09/23 1146 06/09/23 1545 06/10/23 0634 06/11/23 0527  NA 141 137 132* 133*  K 4.1 4.0 4.0 4.1  CL 106 108 103 103  CO2 26 21* 21* 22  GLUCOSE 102* 97 180* 193*  BUN 24* 29* 26* 32*  CREATININE 1.32* 1.31* 0.82 1.16  CALCIUM 9.8 9.1 8.9 8.7*  MG  --   --   --  2.2  PHOS  --   --   --  2.4*   GFR: Estimated Creatinine Clearance: 58.5 mL/min (by C-G formula based on SCr of 1.16 mg/dL). Liver Function Tests: Recent Labs  Lab 06/09/23 1146 06/09/23 1545  AST 18 21  ALT 22 25  ALKPHOS 80 70  BILITOT 0.4 0.4  PROT 7.0 6.9  ALBUMIN 4.5 3.7   No results for input(s): "LIPASE", "AMYLASE" in the last 168 hours. No results for input(s): "AMMONIA" in the last 168 hours. Coagulation Profile: Recent Labs  Lab 06/09/23 1545  INR 1.1   Cardiac Enzymes: No results for input(s): "CKTOTAL", "CKMB", "CKMBINDEX", "TROPONINI" in the last 168 hours. BNP (last 3 results) No results for input(s): "PROBNP" in the last 8760 hours. HbA1C: No results for input(s): "HGBA1C" in the last 72 hours. CBG: No results for input(s): "GLUCAP" in the last 168 hours. Lipid Profile: No results for input(s): "CHOL", "HDL", "LDLCALC", "TRIG", "CHOLHDL", "LDLDIRECT" in the last 72 hours. Thyroid Function Tests: No results for input(s): "TSH", "T4TOTAL", "FREET4", "T3FREE", "THYROIDAB" in the last 72 hours. Anemia Panel: No results for input(s): "VITAMINB12", "FOLATE", "FERRITIN", "TIBC", "IRON", "RETICCTPCT" in the last 72 hours. Sepsis Labs: No results for input(s): "PROCALCITON", "LATICACIDVEN" in the last 168 hours.  No results found for this or any previous visit (from the past 240 hours).       Radiology Studies: US Abdomen Limited Result Date: 06/11/2023 CLINICAL DATA:  Thrombocytopenia.  Splenectomy. EXAM: ULTRASOUND ABDOMEN LIMITED COMPARISON:  None  Available. FINDINGS: Targeted sonographic images of the left upper quadrant performed. Status post prior splenectomy. No splenic tissue noted on the provided images. IMPRESSION: No splenic tissue. Electronically Signed   By: Elgie Collard M.D.   On: 06/11/2023 11:19           LOS: 2 days   Time spent= 35 mins    Miguel Rota, MD [facility]  If 7PM-7AM, please contact night-coverage  06/11/2023, 12:46 PM

## 2023-06-12 ENCOUNTER — Telehealth: Payer: Self-pay

## 2023-06-12 DIAGNOSIS — D693 Immune thrombocytopenic purpura: Secondary | ICD-10-CM | POA: Diagnosis not present

## 2023-06-12 LAB — CBC WITH DIFFERENTIAL/PLATELET
Abs Immature Granulocytes: 0.08 10*3/uL — ABNORMAL HIGH (ref 0.00–0.07)
Basophils Absolute: 0 10*3/uL (ref 0.0–0.1)
Basophils Relative: 0 %
Eosinophils Absolute: 0 10*3/uL (ref 0.0–0.5)
Eosinophils Relative: 0 %
HCT: 35 % — ABNORMAL LOW (ref 39.0–52.0)
Hemoglobin: 11.1 g/dL — ABNORMAL LOW (ref 13.0–17.0)
Immature Granulocytes: 1 %
Lymphocytes Relative: 7 %
Lymphs Abs: 1 10*3/uL (ref 0.7–4.0)
MCH: 29.8 pg (ref 26.0–34.0)
MCHC: 31.7 g/dL (ref 30.0–36.0)
MCV: 93.8 fL (ref 80.0–100.0)
Monocytes Absolute: 1 10*3/uL (ref 0.1–1.0)
Monocytes Relative: 7 %
Neutro Abs: 12.6 10*3/uL — ABNORMAL HIGH (ref 1.7–7.7)
Neutrophils Relative %: 85 %
Platelets: 89 10*3/uL — ABNORMAL LOW (ref 150–400)
RBC: 3.73 MIL/uL — ABNORMAL LOW (ref 4.22–5.81)
RDW: 14.8 % (ref 11.5–15.5)
WBC: 14.7 10*3/uL — ABNORMAL HIGH (ref 4.0–10.5)
nRBC: 0.5 % — ABNORMAL HIGH (ref 0.0–0.2)

## 2023-06-12 LAB — BASIC METABOLIC PANEL
Anion gap: 6 (ref 5–15)
BUN: 36 mg/dL — ABNORMAL HIGH (ref 8–23)
CO2: 21 mmol/L — ABNORMAL LOW (ref 22–32)
Calcium: 8.2 mg/dL — ABNORMAL LOW (ref 8.9–10.3)
Chloride: 103 mmol/L (ref 98–111)
Creatinine, Ser: 1.28 mg/dL — ABNORMAL HIGH (ref 0.61–1.24)
GFR, Estimated: 58 mL/min — ABNORMAL LOW (ref >60)
Glucose, Bld: 275 mg/dL — ABNORMAL HIGH (ref 70–99)
Potassium: 3.8 mmol/L (ref 3.5–5.1)
Sodium: 130 mmol/L — ABNORMAL LOW (ref 135–145)

## 2023-06-12 LAB — MAGNESIUM: Magnesium: 2.2 mg/dL (ref 1.7–2.4)

## 2023-06-12 MED ORDER — RIVAROXABAN 20 MG PO TABS
20.0000 mg | ORAL_TABLET | Freq: Every day | ORAL | Status: DC
  Administered 2023-06-12 – 2023-06-13 (×2): 20 mg via ORAL
  Filled 2023-06-12 (×2): qty 1

## 2023-06-12 MED ORDER — ASPIRIN 81 MG PO TBEC
81.0000 mg | DELAYED_RELEASE_TABLET | Freq: Every day | ORAL | Status: DC
  Administered 2023-06-12 – 2023-06-13 (×2): 81 mg via ORAL
  Filled 2023-06-12 (×2): qty 1

## 2023-06-12 NOTE — TOC Progression Note (Signed)
 Transition of Care [facility]) - Progression Note   Patient Details  Name: Mark [name] MRN: [id number] Date of Birth: 24-Oct-1945  Transition [facility] Care [facility]) CM/SW Contact  Ewing Schlein, LCSW Phone Number: 06/12/2023, 8:41 AM  Clinical Narrative: HH orders placed by hosptalist. CSW notified Kasie with [facility] and she is aware patient is now expected to discharge tomorrow.    Barriers to Discharge: Continued Medical Work up  Expected Discharge Plan and Services In-house Referral: Clinical Social Work Post Acute Care Choice: Home Health Living arrangements for the past 2 months: Single Family Home             DME Arranged: N/A DME Agency: NA HH Agency: Other - See comment ([facility]) Date HH Agency Contacted: 06/11/23 Time HH Agency Contacted: 1249 Representative spoke with at [facility] Agency: Conservation officer, historic buildings  Social Determinants of Health (SDOH) Interventions SDOH Screenings   Food Insecurity: No Food Insecurity (06/10/2023)  Housing: Low Risk  (06/10/2023)  Transportation Needs: No Transportation Needs (06/10/2023)  Utilities: Not At Risk (06/10/2023)  Social Connections: Moderately Integrated (06/10/2023)  Tobacco Use: Medium Risk (06/09/2023)   Readmission Risk Interventions     No data to display

## 2023-06-12 NOTE — Progress Notes (Signed)
 PROGRESS NOTE    Mark [name]  WUJ:[id number] DOB: September 29, 1945 DOA: 06/09/2023 PCP: Laurann Montana, MD    Brief Narrative:  78 year old with history of ITP, lupus anticoagulant, CAD, HTN, CVA with residual left-sided hemiparesis, HLD, CKD stage III, GERD, seizure presented from heme-onc office for management of 3 years thrombocytopenia.  Patient had shingles last week and was started on Valtrex but since then has noted progressive petechiae and purpura on body.  Upon admission platelets noted to be less than 5000.  Started on steroids, Nplate and IVIG per hematology.   Assessment & Plan:  Principal Problem:   Idiopathic thrombocytopenic purpura (ITP) (HCC) Active Problems:   Thrombocytopenia (HCC)   Acute exacerbation of ITP - Platelets <5k, Now improving 89K.  Improving this morning.  Continue IVIG, Decadron and Nplate.  Management per hematology.  Ultrasound spleen negative - Okay to resume aspirin and Xarelto  Dysphagia - Reported by family and the patient. speech and swallow - Regular.  Per family these had appeared on 2/14.  Shingles - Crusted rash over his back.  No need for droplet precaution at this time.  Essential hypertension - Norvasc.  IV as needed  History of lupus anticoagulant History of CVA, hyperlipidemia - ContinueASA/ Statin and xarelto  CKD stage IIIa - Creatinine around baseline 1.34  History of seizure - Keppra  Anxiety/depression - Continue bupropion and sertraline   DVT prophylaxis: SCDs Start: 06/09/23 1803    Code Status: Limited: Do not attempt resuscitation (DNR) -DNR-LIMITED -Do Not Intubate/DNI  Family Communication: None at bedside Status is: Inpatient Remains inpatient appropriate because: Ongoing ITP management    Subjective: No complaints.  No signs of bleeding.   Examination:  General exam: Appears calm and comfortable  Respiratory system: Clear to auscultation. Respiratory effort normal. Cardiovascular system: S1 & S2  heard, RRR. No JVD, murmurs, rubs, gallops or clicks. No pedal edema. Gastrointestinal system: Abdomen is nondistended, soft and nontender. No organomegaly or masses felt. Normal bowel sounds heard. Central nervous system: Alert and oriented. No focal neurological deficits. Extremities: Symmetric 5 x 5 power. Skin: Multiple areas of petechia noted Psychiatry: Judgement and insight appear normal. Mood & affect appropriate.                Diet Orders (From admission, onward)     Start     Ordered   06/09/23 1829  Diet vegetarian Room service appropriate? Yes; Fluid consistency: Thin  Diet effective now       Question Answer Comment  Room service appropriate? Yes   Fluid consistency: Thin      06/09/23 1829            Objective: Vitals:   06/11/23 0938 06/11/23 1133 06/11/23 2058 06/12/23 0623  BP: (!) 145/85 (!) 146/82 135/76 (!) 150/89  Pulse: 80 78 78 80  Resp: 20 18 20 20   Temp: 97.7 F (36.5 C) 98.5 F (36.9 C) 98.2 F (36.8 C) 98.1 F (36.7 C)  TempSrc: Oral Oral Oral Oral  SpO2: 97% 97% 97% 95%  Weight:      Height:        Intake/Output Summary (Last 24 hours) at 06/12/2023 1036 Last data filed at 06/12/2023 0845 Gross per 24 hour  Intake 924.4 ml  Output 4250 ml  Net -3325.6 ml   Filed Weights   06/09/23 1450  Weight: 79.4 kg    Scheduled Meds:  sodium chloride   Intravenous Once   amLODipine  10 mg Oral QHS  aspirin EC  81 mg Oral Daily   buPROPion  150 mg Oral Daily   cholecalciferol  2,000 Units Oral QHS   dexamethasone  40 mg Intravenous Q24H   HYDROcodone-acetaminophen  1 tablet Oral QHS   levETIRAcetam  1,500 mg Oral BID   pantoprazole (PROTONIX) IV  40 mg Intravenous Q12H   rivaroxaban  20 mg Oral Q breakfast   rosuvastatin  40 mg Oral QHS   sertraline  200 mg Oral Daily   Continuous Infusions:  Nutritional status     Body mass index is 23.73 kg/m.  Data Reviewed:   CBC: Recent Labs  Lab 06/09/23 1146 06/09/23 1545  06/10/23 0634 06/11/23 0527 06/12/23 0527  WBC 10.3 11.3* 5.7 8.0 14.7*  NEUTROABS 4.7 5.4 4.4 6.2 12.6*  HGB 13.6 12.7* 12.0* 11.3* 11.1*  HCT 41.6 40.7 36.3* 35.0* 35.0*  MCV 90.4 92.3 90.5 91.9 93.8  PLT <5* <5* 13* 18* 89*   Basic Metabolic Panel: Recent Labs  Lab 06/09/23 1146 06/09/23 1545 06/10/23 0634 06/11/23 0527 06/12/23 0527  NA 141 137 132* 133* 130*  K 4.1 4.0 4.0 4.1 3.8  CL 106 108 103 103 103  CO2 26 21* 21* 22 21*  GLUCOSE 102* 97 180* 193* 275*  BUN 24* 29* 26* 32* 36*  CREATININE 1.32* 1.31* 0.82 1.16 1.28*  CALCIUM 9.8 9.1 8.9 8.7* 8.2*  MG  --   --   --  2.2 2.2  PHOS  --   --   --  2.4*  --    GFR: Estimated Creatinine Clearance: 53 mL/min (A) (by C-G formula based on SCr of 1.28 mg/dL (H)). Liver Function Tests: Recent Labs  Lab 06/09/23 1146 06/09/23 1545  AST 18 21  ALT 22 25  ALKPHOS 80 70  BILITOT 0.4 0.4  PROT 7.0 6.9  ALBUMIN 4.5 3.7   No results for input(s): "LIPASE", "AMYLASE" in the last 168 hours. No results for input(s): "AMMONIA" in the last 168 hours. Coagulation Profile: Recent Labs  Lab 06/09/23 1545  INR 1.1   Cardiac Enzymes: No results for input(s): "CKTOTAL", "CKMB", "CKMBINDEX", "TROPONINI" in the last 168 hours. BNP (last 3 results) No results for input(s): "PROBNP" in the last 8760 hours. HbA1C: No results for input(s): "HGBA1C" in the last 72 hours. CBG: No results for input(s): "GLUCAP" in the last 168 hours. Lipid Profile: No results for input(s): "CHOL", "HDL", "LDLCALC", "TRIG", "CHOLHDL", "LDLDIRECT" in the last 72 hours. Thyroid Function Tests: No results for input(s): "TSH", "T4TOTAL", "FREET4", "T3FREE", "THYROIDAB" in the last 72 hours. Anemia Panel: No results for input(s): "VITAMINB12", "FOLATE", "FERRITIN", "TIBC", "IRON", "RETICCTPCT" in the last 72 hours. Sepsis Labs: No results for input(s): "PROCALCITON", "LATICACIDVEN" in the last 168 hours.  No results found for this or any previous  visit (from the past 240 hours).       Radiology Studies: US Abdomen Limited Result Date: 06/11/2023 CLINICAL DATA:  Thrombocytopenia.  Splenectomy. EXAM: ULTRASOUND ABDOMEN LIMITED COMPARISON:  None Available. FINDINGS: Targeted sonographic images of the left upper quadrant performed. Status post prior splenectomy. No splenic tissue noted on the provided images. IMPRESSION: No splenic tissue. Electronically Signed   By: Elgie Collard M.D.   On: 06/11/2023 11:19           LOS: 3 days   Time spent= 35 mins    Miguel Rota, MD [facility]  If 7PM-7AM, please contact night-coverage  06/12/2023, 10:36 AM

## 2023-06-12 NOTE — Plan of Care (Signed)
  Problem: Clinical Measurements: Goal: Diagnostic test results will improve Outcome: Progressing Goal: Respiratory complications will improve Outcome: Progressing Goal: Cardiovascular complication will be avoided Outcome: Progressing   Problem: Pain Managment: Goal: General experience of comfort will improve and/or be controlled Outcome: Progressing   Problem: Safety: Goal: Ability to remain free from injury will improve Outcome: Progressing

## 2023-06-12 NOTE — Progress Notes (Signed)
 Physical Therapy Treatment Patient Details Name: Mark [name] MRN: [id number] DOB: 09/01/1945 Today's Date: 06/12/2023   History of Present Illness Pt is a 78 yo male admitted with Idiopathic thrombocytopenic purpura, Epistaxis; of note, diagnosed with shingles last Friday and started on Valtrex.  PMH:ITP, lupus anticoagulant, CAD, HTN, CVA with residual left-sided hemiparesis, HLD, CKD stage III, GERD, seizure    PT Comments  Pt tolerated increased ambulation distance of 75' x 2 with hemiwalker, with seated rest break, no loss of balance. Pt required min steadying assist for sit to stand, and min assist to don L shoe. He is motivated and puts forth good effort.    If plan is discharge home, recommend the following: A little help with walking and/or transfers;A little help with bathing/dressing/bathroom;Assistance with cooking/housework;Assist for transportation;Help with stairs or ramp for entrance   Can travel by private vehicle        Equipment Recommendations  None recommended by PT    Recommendations for Other Services       Precautions / Restrictions Precautions Precautions: Fall Restrictions Weight Bearing Restrictions Per Provider Order: No     Mobility  Bed Mobility Overal bed mobility: Modified Independent Bed Mobility: Supine to Sit     Supine to sit: HOB elevated, Used rails     General bed mobility comments: increased time, uses bedrails    Transfers Overall transfer level: Needs assistance Equipment used: Hemi-walker Transfers: Sit to/from Stand Sit to Stand: Contact guard assist, Min assist           General transfer comment: CGA to min A for STS x 2 trials, increased time and effort (2nd trial required 3 attempts before he was able to successfully power up)    Ambulation/Gait Ambulation/Gait assistance: Contact guard assist Gait Distance (Feet): 75 Feet (75' x 2 with seated rest break) Assistive device: Hemi-walker Gait Pattern/deviations:  Step-to pattern Gait velocity: decreased     General Gait Details: step to gait pattern, significant R trunk sway with LLE circumduction compensation pattern, no loss of balance, distance limited by fatigue   Stairs             Wheelchair Mobility     Tilt Bed    Modified Rankin (Stroke Patients Only)       Balance Overall balance assessment: Needs assistance Sitting-balance support: Feet supported Sitting balance-Leahy Scale: Fair   Postural control: Posterior lean Standing balance support: Single extremity supported, Reliant on assistive device for balance Standing balance-Leahy Scale: Poor                              Communication Communication Communication: Impaired Factors Affecting Communication: Hearing impaired  Cognition Arousal: Alert Behavior During Therapy: WFL for tasks assessed/performed   PT - Cognitive impairments: No apparent impairments                         Following commands: Intact      Cueing Cueing Techniques: Verbal cues  Exercises      General Comments        Pertinent Vitals/Pain Pain Assessment Pain Assessment: No/denies pain    Home Living                          Prior Function            PT Goals (current goals can now be found in the  care plan section) Acute Rehab PT Goals Patient Stated Goal: return home, get stronger PT Goal Formulation: With patient/family Time For Goal Achievement: 06/24/23 Potential to Achieve Goals: Good Progress towards PT goals: Progressing toward goals    Frequency    Min 1X/week      PT Plan      Co-evaluation              AM-PAC PT "6 Clicks" Mobility   Outcome Measure  Help needed turning from your back to your side while in a flat bed without using bedrails?: A Little Help needed moving from lying on your back to sitting on the side of a flat bed without using bedrails?: A Little Help needed moving to and from a bed to a  chair (including a wheelchair)?: A Little Help needed standing up from a chair using your arms (e.g., wheelchair or bedside chair)?: A Little Help needed to walk in hospital room?: A Little Help needed climbing 3-5 steps with a railing? : A Lot 6 Click Score: 17    End of Session Equipment Utilized During Treatment: Gait belt Activity Tolerance: Patient tolerated treatment well;Patient limited by fatigue Patient left: in chair;with chair alarm set;with call bell/phone within reach Nurse Communication: Mobility status PT Visit Diagnosis: Unsteadiness on feet (R26.81);Muscle weakness (generalized) (M62.81);Hemiplegia and hemiparesis Hemiplegia - Right/Left: Left     Time: 1610-9604 PT Time Calculation (min) (ACUTE ONLY): 49 min  Charges:    $Gait Training: 23-37 mins $Therapeutic Activity: 8-22 mins PT General Charges $$ ACUTE PT VISIT: 1 Visit                     Tamala Ser PT 06/12/2023  Acute Rehabilitation Services  Office [phone]

## 2023-06-12 NOTE — Telephone Encounter (Signed)
 Received phone call from patient wife stating that she had not been updated on patient status and needed to speak with Dr. Myna Hidalgo urgently.  Pt wife aware that Dr. Myna Hidalgo is in clinic and that he did round on the patient this morning. Pt wife updated that patient platelets are up to 43 which Dr. Myna Hidalgo is very pleased with. Pt wife had a lot of frustrations including his platelet count was not high enough, she didn't know how she was going to get him home from the hospital or out of the car or into the house, what medications he will be discharged with and his plan of care. Pt wife educated that she needs to call the hospital and speak to his case manager and the nurses take care of her husband. Pt wife educated his discharge medications will be listed on his discharge paperwork. Pt wife educated that Dr. Myna Hidalgo has no control over discharge as that is up to the attending MD for her husband. Pt wife voiced multiple other concerns and at the end of the conversation apologized for being so upset with this RN stating "I am just worried about my husband" Pt wife agreeable to plan to speak with case manager and nurse taking care of her husband. No further needs identified. Scheduling message sent to set patient up hospital follow up.

## 2023-06-12 NOTE — Progress Notes (Signed)
 This morning, his platelet count is 89,000!!!!!  Clearly, the steroids, IVIG, and Nplate have helped.  His ultrasound of the abdomen did not show any splenic regrowth.  He feels okay.  He really would like to stay 1 more day in the hospital.  His wife is not doing well at home right now.  He feels that she would benefit from 1 more day with him in the hospital.  I certainly cannot argue with this.  I am just happy that he is responding.  I will give another dose of steroid today.  His CBC shows white cell count of 14.7.  Hemoglobin 11.1.  Platelet count 89,000.  He has had no bleeding.  He has had a good appetite.  He has had no nausea or vomiting.  We had to be cautious with respect to his history of CVA.  I think we can now get him back on the due to Xarelto and aspirin.  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  His temperature is 98.1.  Pulse 80.  Blood pressure 150/89.  His head neck exam shows no ocular or oral lesions.  I really do not see much in the way of oral hematomas.  Lungs are clear bilaterally.  Cardiac exam regular rate and rhythm.  He has no murmurs.  Abdomen is soft.  Bowel sounds are present.  There is no fluid wave.  There is no palpable liver or spleen tip.  Extremities shows no clubbing, cyanosis or edema.  Neurological exam shows a weakness on the left side.  Again, his platelet count is rebounding very nicely.  I think 1 more dose of Decadron will help.  Again I would have in the hospital 1 more day.  I would also start the Xarelto and aspirin.  I want to make sure that we are aware and keep on top of the fact that he has had a CVA.  I do appreciate the incredible care he is gotten from everybody on 4 E.   Christin Bach, MD  Psalms 41:3

## 2023-06-13 DIAGNOSIS — D693 Immune thrombocytopenic purpura: Secondary | ICD-10-CM | POA: Diagnosis not present

## 2023-06-13 LAB — BASIC METABOLIC PANEL
Anion gap: 7 (ref 5–15)
BUN: 43 mg/dL — ABNORMAL HIGH (ref 8–23)
CO2: 20 mmol/L — ABNORMAL LOW (ref 22–32)
Calcium: 8.1 mg/dL — ABNORMAL LOW (ref 8.9–10.3)
Chloride: 107 mmol/L (ref 98–111)
Creatinine, Ser: 1.31 mg/dL — ABNORMAL HIGH (ref 0.61–1.24)
GFR, Estimated: 56 mL/min — ABNORMAL LOW (ref >60)
Glucose, Bld: 323 mg/dL — ABNORMAL HIGH (ref 70–99)
Potassium: 3.7 mmol/L (ref 3.5–5.1)
Sodium: 134 mmol/L — ABNORMAL LOW (ref 135–145)

## 2023-06-13 LAB — CBC WITH DIFFERENTIAL/PLATELET
Abs Immature Granulocytes: 0.25 10*3/uL — ABNORMAL HIGH (ref 0.00–0.07)
Basophils Absolute: 0 10*3/uL (ref 0.0–0.1)
Basophils Relative: 0 %
Eosinophils Absolute: 0 10*3/uL (ref 0.0–0.5)
Eosinophils Relative: 0 %
HCT: 34.1 % — ABNORMAL LOW (ref 39.0–52.0)
Hemoglobin: 10.7 g/dL — ABNORMAL LOW (ref 13.0–17.0)
Immature Granulocytes: 2 %
Lymphocytes Relative: 5 %
Lymphs Abs: 0.8 10*3/uL (ref 0.7–4.0)
MCH: 29.5 pg (ref 26.0–34.0)
MCHC: 31.4 g/dL (ref 30.0–36.0)
MCV: 93.9 fL (ref 80.0–100.0)
Monocytes Absolute: 0.8 10*3/uL (ref 0.1–1.0)
Monocytes Relative: 6 %
Neutro Abs: 12.7 10*3/uL — ABNORMAL HIGH (ref 1.7–7.7)
Neutrophils Relative %: 87 %
Platelets: 200 10*3/uL (ref 150–400)
RBC: 3.63 MIL/uL — ABNORMAL LOW (ref 4.22–5.81)
RDW: 15.2 % (ref 11.5–15.5)
WBC: 14.5 10*3/uL — ABNORMAL HIGH (ref 4.0–10.5)
nRBC: 0.8 % — ABNORMAL HIGH (ref 0.0–0.2)

## 2023-06-13 LAB — MAGNESIUM: Magnesium: 2.3 mg/dL (ref 1.7–2.4)

## 2023-06-13 MED ORDER — SODIUM CHLORIDE 0.9 % IV BOLUS
500.0000 mL | Freq: Once | INTRAVENOUS | Status: AC
  Administered 2023-06-13: 500 mL via INTRAVENOUS

## 2023-06-13 NOTE — Progress Notes (Signed)
 I am really not surprised by the fact that his platelet count is now 200,000.  He has come up incredibly well.  As always, he does respond to immunosuppressive therapy.  The petechia and the ecchymoses all seem to be improving nicely.  He is eating okay.  He has had no nausea or vomiting.  He has had no fever.  There has been no bleeding.  His CBC shows white cell count of 14.5.  Hemoglobin 10.7.  Platelet count 200,000.  His blood sugar is on the high side at 323.  This will have to be corrected.  His BUN is 43 creatinine 1.31.  Calcium 8.1.  He has had no problems with pain.  His vital signs are temperature of 97.7.  Pulse 72.  Blood pressure 138/73.  His head and exam shows improved oral hematomas.  I do not see anything that looks nodular.  The areas of hematoma are all flat now.  There is no adenopathy in the neck.  Lungs are clear bilaterally.  Cardiac exam regular rate and rhythm.  Abdomen is soft.  Bowel sounds are present.  There is no fluid wave.  There is no palpable liver tip.  Extremities shows improved petechia.  Neurological exam shows weakness on the left side.   Again, his platelet counts come up quite nicely.  I suspect that it probably will stay off.  If not, he would be a good candidate for Rituxan.  Again I see no reason that he cannot go home today.  I really am not planning on any additional therapy on him.  His blood sugars are on the high side so that may need to be addressed.  If he does go home, I will follow him up in the office next week.  I do appreciate the incredible care that he is gotten from everybody on 4 E.   Christin Bach, MD   Jeri Modena 17:14

## 2023-06-13 NOTE — Discharge Summary (Signed)
 Physician Discharge Summary  Mark [name] NFA:[id number] DOB: 1945/08/01 DOA: 06/09/2023  PCP: Laurann Montana, MD  Admit date: 06/09/2023 Discharge date: 06/13/2023  Admitted From: Home Disposition:  Home  Recommendations for Outpatient Follow-up:  Follow up with PCP in 1-2 weeks Please obtain BMP/CBC in one week your next doctors visit.  Follow up with Onc.   Discharge Condition: Stable CODE STATUS: DNR Diet recommendation: Cardiac  Brief/Interim Summary: Brief Narrative:  78 year old with history of ITP, lupus anticoagulant, CAD, HTN, CVA with residual left-sided hemiparesis, HLD, CKD stage III, GERD, seizure presented from heme-onc office for management of 3 years thrombocytopenia.  Patient had shingles last week and was started on Valtrex but since then has noted progressive petechiae and purpura on body.  Upon admission platelets noted to be less than 5000.  Started on steroids, Nplate and IVIG per hematology. This morning platelets have recovered doing well.  Okay for discharge with outpatient follow-up  Assessment & Plan:  Principal Problem:   Idiopathic thrombocytopenic purpura (ITP) (HCC) Active Problems:   Thrombocytopenia (HCC)   Acute exacerbation of ITP - Platelets <5k, Now improving 200K.  No further need for IVIG, Decadron and Nplate.  Ultrasound spleen negative. - Okay to resume aspirin and Xarelto  Dysphagia - Reported by family and the patient. speech and swallow - Regular.  Per family these had appeared on 2/14.  Shingles - Crusted rash over his back.  No need for droplet precaution at this time.  Essential hypertension - Norvasc.   History of lupus anticoagulant History of CVA, hyperlipidemia - ContinueASA/ Statin and xarelto  CKD stage IIIa - Creatinine around baseline 1.34  History of seizure - Keppra  Anxiety/depression - Continue bupropion and sertraline   DVT prophylaxis: SCDs Start: 06/09/23 1803    Code Status: Limited: Do not  attempt resuscitation (DNR) -DNR-LIMITED -Do Not Intubate/DNI  Family Communication: None at bedside Status is: Inpatient Remains inpatient appropriate because: DC today    Subjective: No complaints.  No signs of bleeding.   Examination:  General exam: Appears calm and comfortable  Respiratory system: Clear to auscultation. Respiratory effort normal. Cardiovascular system: S1 & S2 heard, RRR. No JVD, murmurs, rubs, gallops or clicks. No pedal edema. Gastrointestinal system: Abdomen is nondistended, soft and nontender. No organomegaly or masses felt. Normal bowel sounds heard. Central nervous system: Alert and oriented. No focal neurological deficits. Extremities: Symmetric 5 x 5 power. Skin: Multiple areas of petechia noted Psychiatry: Judgement and insight appear normal. Mood & affect appropriate.    Discharge Diagnoses:  Principal Problem:   Idiopathic thrombocytopenic purpura (ITP) (HCC) Active Problems:   Thrombocytopenia [facility])      Discharge Exam: Vitals:   06/12/23 2035 06/13/23 0518  BP: (!) 141/78 138/73  Pulse: 83 72  Resp: 15 15  Temp: 98.6 F (37 C) 97.7 F (36.5 C)  SpO2: 96% 93%   Vitals:   06/12/23 0623 06/12/23 1235 06/12/23 2035 06/13/23 0518  BP: (!) 150/89 134/75 (!) 141/78 138/73  Pulse: 80 82 83 72  Resp: 20 20 15 15   Temp: 98.1 F (36.7 C) 98.8 F (37.1 C) 98.6 F (37 C) 97.7 F (36.5 C)  TempSrc: Oral Oral Oral Oral  SpO2: 95% 95% 96% 93%  Weight:      Height:          Discharge Instructions   Allergies as of 06/13/2023       Reactions   Diphenhydramine Other (See Comments)   Affects kidneys   Famotidine Other (  See Comments)   Affects kidneys   Other Other (See Comments)   Patient is a VEGETARIAN        Medication List     TAKE these medications    amLODipine 10 MG tablet Commonly known as: NORVASC Take 10 mg by mouth at bedtime.   aspirin EC 81 MG tablet Take 81 mg by mouth daily. Swallow whole.   baclofen  10 MG tablet Commonly known as: LIORESAL Take 10 mg by mouth in the morning and at bedtime.   buPROPion 150 MG 24 hr tablet Commonly known as: WELLBUTRIN XL Take 150 mg by mouth in the morning.   dexamethasone 4 MG tablet Commonly known as: DECADRON TAKE 10 TABLET BY MOUTH ONCE DAILY AS NEEDED FOR 3 DAYS FOR  ITP What changed:  how much to take how to take this when to take this additional instructions   fluticasone 50 MCG/ACT nasal spray Commonly known as: FLONASE Place 2 sprays into both nostrils daily as needed for allergies or rhinitis.   HYDROcodone-acetaminophen 10-325 MG tablet Commonly known as: NORCO Take 1 tablet by mouth See admin instructions. Take 1 by mouth at bedtime and an additional 1 tablet once a day as needed for pain   levETIRAcetam 500 MG tablet Commonly known as: KEPPRA Take 1,500 mg by mouth in the morning and at bedtime.   omeprazole 40 MG capsule Commonly known as: PRILOSEC Take 40 mg by mouth daily before breakfast.   polyethylene glycol 17 g packet Commonly known as: MIRALAX / GLYCOLAX Take 17 g by mouth daily as needed. What changed: reasons to take this   rosuvastatin 40 MG tablet Commonly known as: CRESTOR Take 40 mg by mouth at bedtime.   sertraline 100 MG tablet Commonly known as: ZOLOFT Take 200 mg by mouth in the morning.   TYLENOL 500 MG tablet Generic drug: acetaminophen Take 500-1,000 mg by mouth at bedtime.   valACYclovir 1000 MG tablet Commonly known as: VALTREX Take 1,000 mg by mouth 3 (three) times daily.   Vitamin D3 50 MCG (2000 UT) Tabs Take 2,000 Units by mouth at bedtime.   Xarelto 20 MG Tabs tablet Generic drug: rivaroxaban Take 1 tablet by mouth once daily with breakfast What changed: how much to take        Follow-up Information     [facility] Follow up.   Why: [facility] will resume PT and OT in the home after discharge.        Laurann Montana, MD Follow up in 1 week(s).    Specialty: Family Medicine Contact information: [address], [address] [place] [address] [phone]                Allergies  Allergen Reactions   Diphenhydramine Other (See Comments)    Affects kidneys   Famotidine Other (See Comments)    Affects kidneys   Other Other (See Comments)    Patient is a VEGETARIAN    You were cared for by a hospitalist during your hospital stay. If you have any questions about your discharge medications or the care you received while you were in the hospital after you are discharged, you can call the unit and asked to speak with the hospitalist on call if the hospitalist that took care of you is not available. Once you are discharged, your primary care physician will handle any further medical issues. Please note that no refills for any discharge medications will be authorized once  you are discharged, as it is imperative that you return to your primary care physician (or establish a relationship with a primary care physician if you do not have one) for your aftercare needs so that they can reassess your need for medications and monitor your lab values.  You were cared for by a hospitalist during your hospital stay. If you have any questions about your discharge medications or the care you received while you were in the hospital after you are discharged, you can call the unit and asked to speak with the hospitalist on call if the hospitalist that took care of you is not available. Once you are discharged, your primary care physician will handle any further medical issues. Please note that NO REFILLS for any discharge medications will be authorized once you are discharged, as it is imperative that you return to your primary care physician (or establish a relationship with a primary care physician if you do not have one) for your aftercare needs so that they can reassess your need for medications and monitor your lab values.  Please request your  Prim.MD to go over all Hospital Tests and Procedure/Radiological results at the follow up, please get all Hospital records sent to your Prim MD by signing hospital release before you go home.  Get CBC, CMP, 2 view Chest X ray checked  by Primary MD during your next visit or SNF MD in 5-7 days ( we routinely change or add medications that can affect your baseline labs and fluid status, therefore we recommend that you get the mentioned basic workup next visit with your PCP, your PCP may decide not to get them or add new tests based on their clinical decision)  On your next visit with your primary care physician please Get Medicines reviewed and adjusted.  If you experience worsening of your admission symptoms, develop shortness of breath, life threatening emergency, suicidal or homicidal thoughts you must seek medical attention immediately by calling 911 or calling your MD immediately  if symptoms less severe.  You Must read complete instructions/literature along with all the possible adverse reactions/side effects for all the Medicines you take and that have been prescribed to you. Take any new Medicines after you have completely understood and accpet all the possible adverse reactions/side effects.   Do not drive, operate heavy machinery, perform activities at heights, swimming or participation in water activities or provide baby sitting services if your were admitted for syncope or siezures until you have seen by Primary MD or a Neurologist and advised to do so again.  Do not drive when taking Pain medications.   Procedures/Studies: US Abdomen Limited Result Date: 06/11/2023 CLINICAL DATA:  Thrombocytopenia.  Splenectomy. EXAM: ULTRASOUND ABDOMEN LIMITED COMPARISON:  None Available. FINDINGS: Targeted sonographic images of the left upper quadrant performed. Status post prior splenectomy. No splenic tissue noted on the provided images. IMPRESSION: No splenic tissue. Electronically Signed   By: Elgie Collard M.D.   On: 06/11/2023 11:19     The results of significant diagnostics from this hospitalization (including imaging, microbiology, ancillary and laboratory) are listed below for reference.     Microbiology: No results found for this or any previous visit (from the past 240 hours).   Labs: BNP (last 3 results) No results for input(s): "BNP" in the last 8760 hours. Basic Metabolic Panel: Recent Labs  Lab 06/09/23 1545 06/10/23 0634 06/11/23 0527 06/12/23 0527 06/13/23 0501  NA 137 132* 133* 130* 134*  K 4.0 4.0 4.1 3.8  3.7  CL 108 103 103 103 107  CO2 21* 21* 22 21* 20*  GLUCOSE 97 180* 193* 275* 323*  BUN 29* 26* 32* 36* 43*  CREATININE 1.31* 0.82 1.16 1.28* 1.31*  CALCIUM 9.1 8.9 8.7* 8.2* 8.1*  MG  --   --  2.2 2.2 2.3  PHOS  --   --  2.4*  --   --    Liver Function Tests: Recent Labs  Lab 06/09/23 1146 06/09/23 1545  AST 18 21  ALT 22 25  ALKPHOS 80 70  BILITOT 0.4 0.4  PROT 7.0 6.9  ALBUMIN 4.5 3.7   No results for input(s): "LIPASE", "AMYLASE" in the last 168 hours. No results for input(s): "AMMONIA" in the last 168 hours. CBC: Recent Labs  Lab 06/09/23 1545 06/10/23 0634 06/11/23 0527 06/12/23 0527 06/13/23 0501  WBC 11.3* 5.7 8.0 14.7* 14.5*  NEUTROABS 5.4 4.4 6.2 12.6* 12.7*  HGB 12.7* 12.0* 11.3* 11.1* 10.7*  HCT 40.7 36.3* 35.0* 35.0* 34.1*  MCV 92.3 90.5 91.9 93.8 93.9  PLT <5* 13* 18* 89* 200   Cardiac Enzymes: No results for input(s): "CKTOTAL", "CKMB", "CKMBINDEX", "TROPONINI" in the last 168 hours. BNP: Invalid input(s): "POCBNP" CBG: No results for input(s): "GLUCAP" in the last 168 hours. D-Dimer No results for input(s): "DDIMER" in the last 72 hours. Hgb A1c No results for input(s): "HGBA1C" in the last 72 hours. Lipid Profile No results for input(s): "CHOL", "HDL", "LDLCALC", "TRIG", "CHOLHDL", "LDLDIRECT" in the last 72 hours. Thyroid function studies No results for input(s): "TSH", "T4TOTAL", "T3FREE",  "THYROIDAB" in the last 72 hours.  Invalid input(s): "FREET3" Anemia work up No results for input(s): "VITAMINB12", "FOLATE", "FERRITIN", "TIBC", "IRON", "RETICCTPCT" in the last 72 hours. Urinalysis    Component Value Date/Time   COLORURINE YELLOW 06/29/2020 2310   APPEARANCEUR HAZY (A) 06/29/2020 2310   LABSPEC 1.010 06/29/2020 2310   PHURINE 6.0 06/29/2020 2310   GLUCOSEU NEGATIVE 06/29/2020 2310   HGBUR SMALL (A) 06/29/2020 2310   BILIRUBINUR NEGATIVE 06/29/2020 2310   KETONESUR NEGATIVE 06/29/2020 2310   PROTEINUR NEGATIVE 06/29/2020 2310   UROBILINOGEN 1.0 04/04/2010 0110   NITRITE NEGATIVE 06/29/2020 2310   LEUKOCYTESUR MODERATE (A) 06/29/2020 2310   Sepsis Labs Recent Labs  Lab 06/10/23 0634 06/11/23 0527 06/12/23 0527 06/13/23 0501  WBC 5.7 8.0 14.7* 14.5*   Microbiology No results found for this or any previous visit (from the past 240 hours).   Time coordinating discharge:  I have spent 35 minutes face to face with the patient and on the ward discussing the patients care, assessment, plan and disposition with other care givers. >50% of the time was devoted counseling the patient about the risks and benefits of treatment/Discharge disposition and coordinating care.   SIGNED:   Miguel Rota, MD  [facility] 06/13/2023, 12:02 PM   If 7PM-7AM, please contact night-coverage

## 2023-06-13 NOTE — Progress Notes (Signed)
 Patient to be discharged to home this afternoon. All Discharge Instructions including all discharge Medications reviewed with the Patient and the Patient's Wife. Understanding verbalized and discharge AVS with the Patient at time of discharge

## 2023-06-13 NOTE — TOC Transition Note (Signed)
 Transition of Care [facility]) - Discharge Note  Patient Details  Name: GIBSON LAD MRN: [id number] Date of Birth: 1945/05/24  Transition of Care [facility]) CM/SW Contact:  Ewing Schlein, LCSW Phone Number: 06/13/2023, 8:30 AM  Clinical Narrative: CSW notified Kasie with [facility] that patient is discharging home today. TOC signing off.   Final next level of care: Home w Home Health Services Barriers to Discharge: Barriers Resolved  Patient Goals and CMS Choice Patient states their goals for this hospitalization and ongoing recovery are:: Resume HH with [facility] CMS Medicare.gov Compare Post Acute Care list provided to:: Patient Choice offered to / list presented to : Patient  Discharge Plan and Services Additional resources added to the After Visit Summary for   In-house Referral: Clinical Social Work Post Acute Care Choice: Home Health          DME Arranged: N/A DME Agency: NA HH Arranged: PT, OT, Nurse's Aide HH Agency: Other - See comment ([facility]) Date HH Agency Contacted: 06/11/23 Time HH Agency Contacted: 1249 Representative spoke with at [facility] Agency: Conservation officer, historic buildings  Social Drivers of Health (SDOH) Interventions SDOH Screenings   Food Insecurity: No Food Insecurity (06/10/2023)  Housing: Low Risk  (06/10/2023)  Transportation Needs: No Transportation Needs (06/10/2023)  Utilities: Not At Risk (06/10/2023)  Social Connections: Moderately Integrated (06/10/2023)  Tobacco Use: Medium Risk (06/09/2023)   Readmission Risk Interventions     No data to display

## 2023-06-13 NOTE — Progress Notes (Signed)
 Physical Therapy Treatment Patient Details Name: Mark [name] MRN: [id number] DOB: 1946-03-21 Today's Date: 06/13/2023   History of Present Illness Pt is a 78 yo male admitted with Idiopathic thrombocytopenic purpura, Epistaxis; of note, diagnosed with shingles last Friday and started on Valtrex.  PMH:ITP, lupus anticoagulant, CAD, HTN, CVA with residual left-sided hemiparesis, HLD, CKD stage III, GERD, seizure    PT Comments  Pt tolerated increased ambulation distance of 100' + 45' with hemiwalker without loss of balance. He would benefit from HHPT.  He puts forth good effort.     If plan is discharge home, recommend the following: A little help with walking and/or transfers;A little help with bathing/dressing/bathroom;Assistance with cooking/housework;Assist for transportation;Help with stairs or ramp for entrance   Can travel by private vehicle        Equipment Recommendations  None recommended by PT    Recommendations for Other Services       Precautions / Restrictions Precautions Precautions: Fall Restrictions Weight Bearing Restrictions Per Provider Order: No     Mobility  Bed Mobility               General bed mobility comments: up in recliner    Transfers Overall transfer level: Needs assistance Equipment used: Hemi-walker Transfers: Sit to/from Stand Sit to Stand: Contact guard assist, Min assist           General transfer comment: CGA to min A for STS x 2 trials, increased time and effort    Ambulation/Gait Ambulation/Gait assistance: Contact guard assist Gait Distance (Feet): 100 Feet (100 + 50) Assistive device: Hemi-walker Gait Pattern/deviations: Step-to pattern Gait velocity: decreased     General Gait Details: step to gait pattern, significant R trunk sway with LLE circumduction compensation pattern, no loss of balance, distance limited by fatigue   Stairs             Wheelchair Mobility     Tilt Bed    Modified Rankin  (Stroke Patients Only)       Balance Overall balance assessment: Needs assistance Sitting-balance support: Feet supported Sitting balance-Leahy Scale: Fair   Postural control: Posterior lean Standing balance support: Single extremity supported, Reliant on assistive device for balance Standing balance-Leahy Scale: Poor                              Communication Communication Communication: Impaired Factors Affecting Communication: Hearing impaired  Cognition Arousal: Alert Behavior During Therapy: WFL for tasks assessed/performed   PT - Cognitive impairments: No apparent impairments                         Following commands: Intact      Cueing Cueing Techniques: Verbal cues  Exercises      General Comments        Pertinent Vitals/Pain Pain Assessment Pain Assessment: No/denies pain    Home Living                          Prior Function            PT Goals (current goals can now be found in the care plan section) Acute Rehab PT Goals Patient Stated Goal: return home, get stronger PT Goal Formulation: With patient/family Time For Goal Achievement: 06/24/23 Potential to Achieve Goals: Good Progress towards PT goals: Progressing toward goals    Frequency    Min 1X/week  PT Plan      Co-evaluation              AM-PAC PT "6 Clicks" Mobility   Outcome Measure  Help needed turning from your back to your side while in a flat bed without using bedrails?: A Little Help needed moving from lying on your back to sitting on the side of a flat bed without using bedrails?: A Little Help needed moving to and from a bed to a chair (including a wheelchair)?: A Little Help needed standing up from a chair using your arms (e.g., wheelchair or bedside chair)?: A Little Help needed to walk in hospital room?: A Little Help needed climbing 3-5 steps with a railing? : A Lot 6 Click Score: 17    End of Session Equipment  Utilized During Treatment: Gait belt Activity Tolerance: Patient tolerated treatment well;Patient limited by fatigue Patient left: in chair;with chair alarm set;with call bell/phone within reach Nurse Communication: Mobility status PT Visit Diagnosis: Unsteadiness on feet (R26.81);Muscle weakness (generalized) (M62.81);Hemiplegia and hemiparesis Hemiplegia - Right/Left: Left     Time: 2956-2130 PT Time Calculation (min) (ACUTE ONLY): 35 min  Charges:    $Gait Training: 23-37 mins PT General Charges $$ ACUTE PT VISIT: 1 Visit                     Tamala Ser PT 06/13/2023  Acute Rehabilitation Services  Office [phone]

## 2023-06-13 NOTE — Care Management Important Message (Signed)
 Important Message  Patient Details IM Letter given Name: Mark [name] MRN: [id number] Date of Birth: 11-16-1945   Important Message Given:  Yes - Medicare IM     Caren Macadam 06/13/2023, 11:09 AM

## 2023-06-20 ENCOUNTER — Encounter: Payer: Self-pay | Admitting: Hematology & Oncology

## 2023-06-20 ENCOUNTER — Inpatient Hospital Stay: Attending: Medical Oncology

## 2023-06-20 ENCOUNTER — Inpatient Hospital Stay: Admitting: Hematology & Oncology

## 2023-06-20 ENCOUNTER — Other Ambulatory Visit: Payer: Self-pay

## 2023-06-20 ENCOUNTER — Telehealth: Payer: Self-pay

## 2023-06-20 VITALS — BP 103/60 | HR 78 | Temp 99.1°F | Resp 18 | Ht 72.0 in

## 2023-06-20 DIAGNOSIS — R531 Weakness: Secondary | ICD-10-CM | POA: Insufficient documentation

## 2023-06-20 DIAGNOSIS — R202 Paresthesia of skin: Secondary | ICD-10-CM | POA: Diagnosis not present

## 2023-06-20 DIAGNOSIS — D6862 Lupus anticoagulant syndrome: Secondary | ICD-10-CM | POA: Diagnosis not present

## 2023-06-20 DIAGNOSIS — D693 Immune thrombocytopenic purpura: Secondary | ICD-10-CM

## 2023-06-20 DIAGNOSIS — Z7901 Long term (current) use of anticoagulants: Secondary | ICD-10-CM | POA: Insufficient documentation

## 2023-06-20 DIAGNOSIS — Z7982 Long term (current) use of aspirin: Secondary | ICD-10-CM | POA: Insufficient documentation

## 2023-06-20 DIAGNOSIS — I69354 Hemiplegia and hemiparesis following cerebral infarction affecting left non-dominant side: Secondary | ICD-10-CM | POA: Insufficient documentation

## 2023-06-20 LAB — CBC WITH DIFFERENTIAL (CANCER CENTER ONLY)
Abs Immature Granulocytes: 0.49 10*3/uL — ABNORMAL HIGH (ref 0.00–0.07)
Basophils Absolute: 0.1 10*3/uL (ref 0.0–0.1)
Basophils Relative: 1 %
Eosinophils Absolute: 0.2 10*3/uL (ref 0.0–0.5)
Eosinophils Relative: 2 %
HCT: 39.8 % (ref 39.0–52.0)
Hemoglobin: 13 g/dL (ref 13.0–17.0)
Immature Granulocytes: 4 %
Lymphocytes Relative: 19 %
Lymphs Abs: 2.4 10*3/uL (ref 0.7–4.0)
MCH: 29.7 pg (ref 26.0–34.0)
MCHC: 32.7 g/dL (ref 30.0–36.0)
MCV: 91.1 fL (ref 80.0–100.0)
Monocytes Absolute: 2.6 10*3/uL — ABNORMAL HIGH (ref 0.1–1.0)
Monocytes Relative: 21 %
Neutro Abs: 6.8 10*3/uL (ref 1.7–7.7)
Neutrophils Relative %: 53 %
Platelet Count: 996 10*3/uL (ref 150–400)
RBC: 4.37 MIL/uL (ref 4.22–5.81)
RDW: 17.2 % — ABNORMAL HIGH (ref 11.5–15.5)
WBC Count: 12.6 10*3/uL — ABNORMAL HIGH (ref 4.0–10.5)
nRBC: 0.6 % — ABNORMAL HIGH (ref 0.0–0.2)

## 2023-06-20 LAB — CMP (CANCER CENTER ONLY)
ALT: 64 U/L — ABNORMAL HIGH (ref 0–44)
AST: 52 U/L — ABNORMAL HIGH (ref 15–41)
Albumin: 4.1 g/dL (ref 3.5–5.0)
Alkaline Phosphatase: 72 U/L (ref 38–126)
Anion gap: 8 (ref 5–15)
BUN: 24 mg/dL — ABNORMAL HIGH (ref 8–23)
CO2: 23 mmol/L (ref 22–32)
Calcium: 9.5 mg/dL (ref 8.9–10.3)
Chloride: 104 mmol/L (ref 98–111)
Creatinine: 1.39 mg/dL — ABNORMAL HIGH (ref 0.61–1.24)
GFR, Estimated: 52 mL/min — ABNORMAL LOW (ref >60)
Glucose, Bld: 141 mg/dL — ABNORMAL HIGH (ref 70–99)
Potassium: 4 mmol/L (ref 3.5–5.1)
Sodium: 135 mmol/L (ref 135–145)
Total Bilirubin: 0.4 mg/dL (ref 0.0–1.2)
Total Protein: 8.4 g/dL — ABNORMAL HIGH (ref 6.5–8.1)

## 2023-06-20 LAB — SAVE SMEAR(SSMR), FOR PROVIDER SLIDE REVIEW

## 2023-06-20 NOTE — Telephone Encounter (Signed)
 Dr Myna Hidalgo aware of critical high platelets of 996. To address in today's office visit. dph

## 2023-06-20 NOTE — Progress Notes (Signed)
 Hematology and Oncology Follow Up Visit  Mark [name] [id number] Jan 19, 1946 78 y.o. 06/20/2023   Principle Diagnosis:  1. ITP --occasionally relapsed-last relapse 01/06/2023. 2. 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 3. Positive lupus anticoagulant.   Current Therapy:        Xarelto 20 mg PO daily/ EC ASA 81 mg po q day   Interim History:  Mark [name] is here today with his wife for follow-up.  He was hospitalized a week or so ago because of relapse of his ITP.  His platelet count was less than 5000.  He is having some ecchymoses.  He was having some slight epistaxis.  He was hospitalized.  We put him on IVIG.  He got pulse Decadron.  He also got Nplate.  His platelet count responded incredibly well.  When he was discharged a week ago, the platelet count was 200,000.  Now, the platelet count is almost the million.  Again he has had a very brisk response to immunosuppressive therapy.  I have to believe that the shingles that he had which appear to be in the right C7-C8 dermatome may have contributed to the relapse of his ITP.  He is feeling well right now.  He has had no complaints.  The areas of ecchymoses are resolving.  He has some tingling in his fingertips.  Again his platelet count is almost 1 million.  I did look at his blood smear under the microscope.  The platelets are mostly small.  He has a few large platelets.  Platelets are well granulated.  I do not see any nucleated red blood cells.  I saw no teardrop cells.  So no immature myeloid or lymphoid cells.  Again, has had a incredibly brisk response to the immunosuppressive therapy.  Currently, I would say that his performance status is probably ECOG 2.    Wt Readings from Last 3 Encounters:  06/09/23 175 lb (79.4 kg)  01/16/23 179 lb 1.9 oz (81.2 kg)  05/23/21 186 lb 12.8 oz (84.7 kg)   Medications:  Allergies as of 06/20/2023       Reactions   Diphenhydramine Other (See Comments)   Affects  kidneys   Famotidine Other (See Comments)   Affects kidneys   Other Other (See Comments)   Patient is a VEGETARIAN        Medication List        Accurate as of June 20, 2023  3:43 PM. If you have any questions, ask your nurse or doctor.          amLODipine 10 MG tablet Commonly known as: NORVASC Take 10 mg by mouth at bedtime.   aspirin EC 81 MG tablet Take 81 mg by mouth daily. Swallow whole.   baclofen 10 MG tablet Commonly known as: LIORESAL Take 10 mg by mouth in the morning and at bedtime.   buPROPion 150 MG 24 hr tablet Commonly known as: WELLBUTRIN XL Take 150 mg by mouth in the morning.   dexamethasone 4 MG tablet Commonly known as: DECADRON TAKE 10 TABLET BY MOUTH ONCE DAILY AS NEEDED FOR 3 DAYS FOR  ITP What changed:  how much to take how to take this when to take this additional instructions   fluticasone 50 MCG/ACT nasal spray Commonly known as: FLONASE Place 2 sprays into both nostrils daily as needed for allergies or rhinitis.   HYDROcodone-acetaminophen 10-325 MG tablet Commonly known as: NORCO Take 1 tablet by mouth See admin instructions. Take 1 by mouth  at bedtime and an additional 1 tablet once a day as needed for pain   levETIRAcetam 500 MG tablet Commonly known as: KEPPRA Take 1,500 mg by mouth in the morning and at bedtime.   omeprazole 40 MG capsule Commonly known as: PRILOSEC Take 40 mg by mouth daily before breakfast.   polyethylene glycol 17 g packet Commonly known as: MIRALAX / GLYCOLAX Take 17 g by mouth daily as needed. What changed: reasons to take this   rosuvastatin 40 MG tablet Commonly known as: CRESTOR Take 40 mg by mouth at bedtime.   sertraline 100 MG tablet Commonly known as: ZOLOFT Take 200 mg by mouth in the morning.   TYLENOL 500 MG tablet Generic drug: acetaminophen Take 500-1,000 mg by mouth at bedtime.   valACYclovir 1000 MG tablet Commonly known as: VALTREX Take 1,000 mg by mouth 3 (three) times  daily.   Vitamin D3 50 MCG (2000 UT) Tabs Take 2,000 Units by mouth at bedtime.   Xarelto 20 MG Tabs tablet Generic drug: rivaroxaban Take 1 tablet by mouth once daily with breakfast What changed: how much to take   rivaroxaban 20 MG Tabs tablet Commonly known as: XARELTO Take 20 mg by mouth daily with supper. What changed: Another medication with the same name was changed. Make sure you understand how and when to take each.        Allergies:  Allergies  Allergen Reactions   Diphenhydramine Other (See Comments)    Affects kidneys   Famotidine Other (See Comments)    Affects kidneys   Other Other (See Comments)    Patient is a VEGETARIAN    Past Medical History, Surgical history, Social history, and Family History were reviewed and updated.  Review of Systems: Review of Systems  Constitutional:  Negative for weight loss.  HENT: Negative.    Eyes: Negative.   Respiratory: Negative.    Cardiovascular: Negative.   Gastrointestinal: Negative.   Genitourinary: Negative.   Musculoskeletal: Negative.   Skin:  Positive for rash.  Neurological:  Negative for focal weakness.  Endo/Heme/Allergies:  Does not bruise/bleed easily.  Psychiatric/Behavioral: Negative.       Physical Exam:  Vital signs show temperature 99.1.  Pulse 78.  Blood pressure 103/60.  Weight was not taken.  Wt Readings from Last 3 Encounters:  06/09/23 175 lb (79.4 kg)  01/16/23 179 lb 1.9 oz (81.2 kg)  05/23/21 186 lb 12.8 oz (84.7 kg)    Physical Exam Vitals reviewed.  Constitutional:      Comments: Sitting comfortably in wheelchair  HENT:     Head: Normocephalic and atraumatic.     Nose:     Comments: Scant dry crusted blood of internal nostrils Eyes:     Pupils: Pupils are equal, round, and reactive to light.  Cardiovascular:     Rate and Rhythm: Normal rate and regular rhythm.     Heart sounds: Normal heart sounds.  Pulmonary:     Effort: Pulmonary effort is normal.     Breath  sounds: Normal breath sounds.  Abdominal:     General: Bowel sounds are normal.     Palpations: Abdomen is soft.  Musculoskeletal:        General: No tenderness or deformity. Normal range of motion.     Cervical back: Normal range of motion.  Lymphadenopathy:     Cervical: No cervical adenopathy.  Skin:    General: Skin is warm and dry.     Findings: Bruising (Resolving scattered moderately sized  areas of ecchymosis  of the arms), lesion (slow weeping large lesion of the left internal mouth) and rash present. No erythema.  Neurological:     Mental Status: He is alert and oriented to person, place, and time.     Comments: Neurological exam shows a continued weakness over on the left side.  Psychiatric:        Behavior: Behavior normal.        Thought Content: Thought content normal.        Judgment: Judgment normal.      Lab Results  Component Value Date   WBC 12.6 (H) 06/20/2023   HGB 13.0 06/20/2023   HCT 39.8 06/20/2023   MCV 91.1 06/20/2023   PLT 996 (HH) 06/20/2023   Lab Results  Component Value Date   FERRITIN 1,063 (H) 10/23/2016   IRON 15 (L) 06/30/2020   TIBC 196 (L) 06/30/2020   UIBC 181 06/30/2020   IRONPCTSAT 8 (L) 06/30/2020   Lab Results  Component Value Date   RETICCTPCT 1.5 08/20/2010   RBC 4.37 06/20/2023   RETICCTABS 75.3 08/20/2010   No results found for: "KPAFRELGTCHN", "LAMBDASER", "KAPLAMBRATIO" No results found for: "IGGSERUM", "IGA", "IGMSERUM" No results found for: "TOTALPROTELP", "ALBUMINELP", "A1GS", "A2GS", "BETS", "BETA2SER", "GAMS", "MSPIKE", "SPEI"   Chemistry      Component Value Date/Time   NA 134 (L) 06/13/2023 0501   NA 146 (H) 04/09/2017 1405   NA 138 05/09/2016 1321   K 3.7 06/13/2023 0501   K 4.0 04/09/2017 1405   K 4.3 05/09/2016 1321   CL 107 06/13/2023 0501   CL 108 04/09/2017 1405   CO2 20 (L) 06/13/2023 0501   CO2 23 04/09/2017 1405   CO2 22 05/09/2016 1321   BUN 43 (H) 06/13/2023 0501   BUN 34 (H) 04/09/2017  1405   BUN 36.4 (H) 05/09/2016 1321   CREATININE 1.31 (H) 06/13/2023 0501   CREATININE 1.61 (H) 03/03/2023 1450   CREATININE 1.3 (H) 04/09/2017 1405   CREATININE 1.5 (H) 05/09/2016 1321      Component Value Date/Time   CALCIUM 8.1 (L) 06/13/2023 0501   CALCIUM 9.1 04/09/2017 1405   CALCIUM 9.9 05/09/2016 1321   ALKPHOS 70 06/09/2023 1545   ALKPHOS 61 04/09/2017 1405   ALKPHOS 103 05/09/2016 1321   AST 21 06/09/2023 1545   AST 20 03/03/2023 1450   AST 39 (H) 05/09/2016 1321   ALT 25 06/09/2023 1545   ALT 30 03/03/2023 1450   ALT 57 (H) 04/09/2017 1405   ALT 64 (H) 05/09/2016 1321   BILITOT 0.4 06/09/2023 1545   BILITOT 0.5 03/03/2023 1450   BILITOT 0.49 05/09/2016 1321       Impression and Plan: Mr. Curl is a very pleasant 78 yo caucasian gentleman with ITP.  He has occasional relapses for which he gets high-dose Decadron for 3 days which works well for him.  He had a very severe relapse.  Again here he responded incredibly well.  I do believe that his platelet count will gradually come down.  For right now, I told his wife to make sure he takes to baby aspirin a day along with his blood thinner.  We will have his labs checked weekly.  I will plan to see him back myself in 2 weeks.  I am happy that he responded so quickly to immunosuppressive therapy.   Josph Macho, MD 3/7/20253:43 PM

## 2023-06-26 DIAGNOSIS — D693 Immune thrombocytopenic purpura: Secondary | ICD-10-CM | POA: Diagnosis not present

## 2023-06-26 DIAGNOSIS — Z8619 Personal history of other infectious and parasitic diseases: Secondary | ICD-10-CM | POA: Diagnosis not present

## 2023-06-26 DIAGNOSIS — B0229 Other postherpetic nervous system involvement: Secondary | ICD-10-CM | POA: Diagnosis not present

## 2023-06-27 ENCOUNTER — Inpatient Hospital Stay

## 2023-06-27 ENCOUNTER — Telehealth: Payer: Self-pay | Admitting: *Deleted
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
## 2023-06-30 ENCOUNTER — Telehealth: Payer: Self-pay | Admitting: *Deleted

## 2023-06-30 NOTE — Telephone Encounter (Signed)
 Patients wife Karin Golden called stating that Mark [name] has a blood blister like thing under his tongue that bled for a short period of time.  It was actually a blood clot.  It did not bleed long and is not bleeding at this time. Dr Myna Hidalgo notified.  Says to just watch it for now and let us know if the bleeding becomes a problem again.  Karin Golden appreciates the call and will call us if needed

## 2023-07-04 ENCOUNTER — Other Ambulatory Visit: Payer: Self-pay

## 2023-07-04 ENCOUNTER — Inpatient Hospital Stay (HOSPITAL_COMMUNITY)
Admission: EM | Admit: 2023-07-04 | Discharge: 2023-07-09 | DRG: 813 | Disposition: A | Discharge status: Rehabilitation Facility | Source: Ambulatory Visit | Attending: Family Medicine | Admitting: Family Medicine

## 2023-07-04 ENCOUNTER — Inpatient Hospital Stay (HOSPITAL_BASED_OUTPATIENT_CLINIC_OR_DEPARTMENT_OTHER): Admitting: Hematology & Oncology

## 2023-07-04 ENCOUNTER — Inpatient Hospital Stay

## 2023-07-04 ENCOUNTER — Encounter: Payer: Self-pay | Admitting: Hematology & Oncology

## 2023-07-04 ENCOUNTER — Encounter (HOSPITAL_COMMUNITY): Payer: Self-pay

## 2023-07-04 VITALS — BP 120/73 | HR 77 | Temp 98.7°F | Resp 20

## 2023-07-04 DIAGNOSIS — G8194 Hemiplegia, unspecified affecting left nondominant side: Secondary | ICD-10-CM | POA: Diagnosis not present

## 2023-07-04 DIAGNOSIS — I10 Essential (primary) hypertension: Secondary | ICD-10-CM | POA: Diagnosis not present

## 2023-07-04 DIAGNOSIS — I69398 Other sequelae of cerebral infarction: Secondary | ICD-10-CM | POA: Diagnosis not present

## 2023-07-04 DIAGNOSIS — E876 Hypokalemia: Secondary | ICD-10-CM | POA: Diagnosis not present

## 2023-07-04 DIAGNOSIS — D72829 Elevated white blood cell count, unspecified: Secondary | ICD-10-CM | POA: Diagnosis present

## 2023-07-04 DIAGNOSIS — Z7901 Long term (current) use of anticoagulants: Secondary | ICD-10-CM

## 2023-07-04 DIAGNOSIS — R0602 Shortness of breath: Secondary | ICD-10-CM | POA: Diagnosis not present

## 2023-07-04 DIAGNOSIS — Z91014 Allergy to mammalian meats: Secondary | ICD-10-CM | POA: Diagnosis not present

## 2023-07-04 DIAGNOSIS — D693 Immune thrombocytopenic purpura: Secondary | ICD-10-CM

## 2023-07-04 DIAGNOSIS — Z87891 Personal history of nicotine dependence: Secondary | ICD-10-CM

## 2023-07-04 DIAGNOSIS — I639 Cerebral infarction, unspecified: Secondary | ICD-10-CM | POA: Diagnosis present

## 2023-07-04 DIAGNOSIS — F32A Depression, unspecified: Secondary | ICD-10-CM | POA: Diagnosis not present

## 2023-07-04 DIAGNOSIS — N179 Acute kidney failure, unspecified: Secondary | ICD-10-CM | POA: Diagnosis not present

## 2023-07-04 DIAGNOSIS — D6862 Lupus anticoagulant syndrome: Secondary | ICD-10-CM | POA: Diagnosis present

## 2023-07-04 DIAGNOSIS — I129 Hypertensive chronic kidney disease with stage 1 through stage 4 chronic kidney disease, or unspecified chronic kidney disease: Secondary | ICD-10-CM | POA: Diagnosis not present

## 2023-07-04 DIAGNOSIS — R21 Rash and other nonspecific skin eruption: Secondary | ICD-10-CM | POA: Diagnosis not present

## 2023-07-04 DIAGNOSIS — T380X5A Adverse effect of glucocorticoids and synthetic analogues, initial encounter: Secondary | ICD-10-CM | POA: Diagnosis not present

## 2023-07-04 DIAGNOSIS — G894 Chronic pain syndrome: Secondary | ICD-10-CM | POA: Diagnosis not present

## 2023-07-04 DIAGNOSIS — I69354 Hemiplegia and hemiparesis following cerebral infarction affecting left non-dominant side: Secondary | ICD-10-CM

## 2023-07-04 DIAGNOSIS — Z9081 Acquired absence of spleen: Secondary | ICD-10-CM | POA: Diagnosis not present

## 2023-07-04 DIAGNOSIS — B029 Zoster without complications: Secondary | ICD-10-CM | POA: Diagnosis not present

## 2023-07-04 DIAGNOSIS — G40909 Epilepsy, unspecified, not intractable, without status epilepticus: Secondary | ICD-10-CM | POA: Diagnosis present

## 2023-07-04 DIAGNOSIS — R569 Unspecified convulsions: Secondary | ICD-10-CM | POA: Diagnosis not present

## 2023-07-04 DIAGNOSIS — K219 Gastro-esophageal reflux disease without esophagitis: Secondary | ICD-10-CM | POA: Diagnosis not present

## 2023-07-04 DIAGNOSIS — Z9861 Coronary angioplasty status: Secondary | ICD-10-CM | POA: Diagnosis not present

## 2023-07-04 DIAGNOSIS — Z888 Allergy status to other drugs, medicaments and biological substances status: Secondary | ICD-10-CM | POA: Diagnosis not present

## 2023-07-04 DIAGNOSIS — Z86718 Personal history of other venous thrombosis and embolism: Secondary | ICD-10-CM

## 2023-07-04 DIAGNOSIS — A4151 Sepsis due to Escherichia coli [E. coli]: Secondary | ICD-10-CM | POA: Diagnosis not present

## 2023-07-04 DIAGNOSIS — R5381 Other malaise: Secondary | ICD-10-CM | POA: Diagnosis not present

## 2023-07-04 DIAGNOSIS — E785 Hyperlipidemia, unspecified: Secondary | ICD-10-CM | POA: Diagnosis not present

## 2023-07-04 DIAGNOSIS — N1831 Chronic kidney disease, stage 3a: Secondary | ICD-10-CM | POA: Diagnosis present

## 2023-07-04 DIAGNOSIS — R197 Diarrhea, unspecified: Secondary | ICD-10-CM | POA: Diagnosis not present

## 2023-07-04 DIAGNOSIS — D696 Thrombocytopenia, unspecified: Secondary | ICD-10-CM

## 2023-07-04 DIAGNOSIS — G8114 Spastic hemiplegia affecting left nondominant side: Secondary | ICD-10-CM | POA: Diagnosis not present

## 2023-07-04 DIAGNOSIS — R739 Hyperglycemia, unspecified: Secondary | ICD-10-CM | POA: Diagnosis not present

## 2023-07-04 DIAGNOSIS — R7303 Prediabetes: Secondary | ICD-10-CM | POA: Diagnosis not present

## 2023-07-04 DIAGNOSIS — I69391 Dysphagia following cerebral infarction: Secondary | ICD-10-CM | POA: Diagnosis not present

## 2023-07-04 DIAGNOSIS — I2510 Atherosclerotic heart disease of native coronary artery without angina pectoris: Secondary | ICD-10-CM | POA: Diagnosis present

## 2023-07-04 DIAGNOSIS — R040 Epistaxis: Secondary | ICD-10-CM | POA: Diagnosis not present

## 2023-07-04 DIAGNOSIS — G4730 Sleep apnea, unspecified: Secondary | ICD-10-CM | POA: Diagnosis not present

## 2023-07-04 DIAGNOSIS — R252 Cramp and spasm: Secondary | ICD-10-CM | POA: Diagnosis not present

## 2023-07-04 DIAGNOSIS — D62 Acute posthemorrhagic anemia: Secondary | ICD-10-CM | POA: Diagnosis not present

## 2023-07-04 DIAGNOSIS — Z66 Do not resuscitate: Secondary | ICD-10-CM | POA: Diagnosis not present

## 2023-07-04 DIAGNOSIS — F419 Anxiety disorder, unspecified: Secondary | ICD-10-CM | POA: Diagnosis present

## 2023-07-04 DIAGNOSIS — Z79899 Other long term (current) drug therapy: Secondary | ICD-10-CM | POA: Diagnosis not present

## 2023-07-04 DIAGNOSIS — G629 Polyneuropathy, unspecified: Secondary | ICD-10-CM | POA: Diagnosis not present

## 2023-07-04 DIAGNOSIS — Z7982 Long term (current) use of aspirin: Secondary | ICD-10-CM

## 2023-07-04 DIAGNOSIS — M21372 Foot drop, left foot: Secondary | ICD-10-CM | POA: Diagnosis not present

## 2023-07-04 DIAGNOSIS — M792 Neuralgia and neuritis, unspecified: Secondary | ICD-10-CM | POA: Diagnosis not present

## 2023-07-04 DIAGNOSIS — R7401 Elevation of levels of liver transaminase levels: Secondary | ICD-10-CM | POA: Diagnosis not present

## 2023-07-04 DIAGNOSIS — N183 Chronic kidney disease, stage 3 unspecified: Secondary | ICD-10-CM | POA: Diagnosis present

## 2023-07-04 DIAGNOSIS — N319 Neuromuscular dysfunction of bladder, unspecified: Secondary | ICD-10-CM | POA: Diagnosis not present

## 2023-07-04 DIAGNOSIS — I69359 Hemiplegia and hemiparesis following cerebral infarction affecting unspecified side: Secondary | ICD-10-CM | POA: Diagnosis not present

## 2023-07-04 LAB — CMP (CANCER CENTER ONLY)
ALT: 32 U/L (ref 0–44)
AST: 35 U/L (ref 15–41)
Albumin: 4.3 g/dL (ref 3.5–5.0)
Alkaline Phosphatase: 70 U/L (ref 38–126)
Anion gap: 8 (ref 5–15)
BUN: 24 mg/dL — ABNORMAL HIGH (ref 8–23)
CO2: 27 mmol/L (ref 22–32)
Calcium: 9.6 mg/dL (ref 8.9–10.3)
Chloride: 105 mmol/L (ref 98–111)
Creatinine: 1.3 mg/dL — ABNORMAL HIGH (ref 0.61–1.24)
GFR, Estimated: 56 mL/min — ABNORMAL LOW (ref >60)
Glucose, Bld: 106 mg/dL — ABNORMAL HIGH (ref 70–99)
Potassium: 4.7 mmol/L (ref 3.5–5.1)
Sodium: 140 mmol/L (ref 135–145)
Total Bilirubin: 0.4 mg/dL (ref 0.0–1.2)
Total Protein: 7.8 g/dL (ref 6.5–8.1)

## 2023-07-04 LAB — CBC WITH DIFFERENTIAL (CANCER CENTER ONLY)
Abs Immature Granulocytes: 0.15 10*3/uL — ABNORMAL HIGH (ref 0.00–0.07)
Basophils Absolute: 0.1 10*3/uL (ref 0.0–0.1)
Basophils Relative: 1 %
Eosinophils Absolute: 0.7 10*3/uL — ABNORMAL HIGH (ref 0.0–0.5)
Eosinophils Relative: 6 %
HCT: 39.7 % (ref 39.0–52.0)
Hemoglobin: 12.9 g/dL — ABNORMAL LOW (ref 13.0–17.0)
Immature Granulocytes: 1 %
Lymphocytes Relative: 27 %
Lymphs Abs: 2.9 10*3/uL (ref 0.7–4.0)
MCH: 30 pg (ref 26.0–34.0)
MCHC: 32.5 g/dL (ref 30.0–36.0)
MCV: 92.3 fL (ref 80.0–100.0)
Monocytes Absolute: 1.6 10*3/uL — ABNORMAL HIGH (ref 0.1–1.0)
Monocytes Relative: 15 %
Neutro Abs: 5.3 10*3/uL (ref 1.7–7.7)
Neutrophils Relative %: 50 %
Platelet Count: 17 10*3/uL — ABNORMAL LOW (ref 150–400)
RBC: 4.3 MIL/uL (ref 4.22–5.81)
RDW: 17.1 % — ABNORMAL HIGH (ref 11.5–15.5)
WBC Count: 10.6 10*3/uL — ABNORMAL HIGH (ref 4.0–10.5)
nRBC: 0.3 % — ABNORMAL HIGH (ref 0.0–0.2)

## 2023-07-04 LAB — SAVE SMEAR(SSMR), FOR PROVIDER SLIDE REVIEW

## 2023-07-04 MED ORDER — ROMIPLOSTIM 250 MCG ~~LOC~~ SOLR
2.0000 ug/kg | Freq: Once | SUBCUTANEOUS | Status: AC
  Administered 2023-07-04: 160 ug via SUBCUTANEOUS
  Filled 2023-07-04: qty 0.32

## 2023-07-04 MED ORDER — PANTOPRAZOLE SODIUM 40 MG PO TBEC
40.0000 mg | DELAYED_RELEASE_TABLET | Freq: Every day | ORAL | Status: DC
  Administered 2023-07-04 – 2023-07-09 (×6): 40 mg via ORAL
  Filled 2023-07-04 (×6): qty 1

## 2023-07-04 MED ORDER — GABAPENTIN 100 MG PO CAPS
100.0000 mg | ORAL_CAPSULE | Freq: Every day | ORAL | Status: DC
  Administered 2023-07-04 – 2023-07-08 (×5): 100 mg via ORAL
  Filled 2023-07-04 (×5): qty 1

## 2023-07-04 MED ORDER — BACLOFEN 10 MG PO TABS
10.0000 mg | ORAL_TABLET | Freq: Two times a day (BID) | ORAL | Status: DC
  Administered 2023-07-04 – 2023-07-09 (×10): 10 mg via ORAL
  Filled 2023-07-04 (×10): qty 1

## 2023-07-04 MED ORDER — AMLODIPINE BESYLATE 5 MG PO TABS: 10.0000 mg | ORAL_TABLET | Freq: Every day | ORAL | Status: DC

## 2023-07-04 MED ORDER — SERTRALINE HCL 100 MG PO TABS
200.0000 mg | ORAL_TABLET | Freq: Every morning | ORAL | Status: DC
  Administered 2023-07-05 – 2023-07-09 (×5): 200 mg via ORAL
  Filled 2023-07-04 (×5): qty 2

## 2023-07-04 MED ORDER — BUPROPION HCL ER (XL) 150 MG PO TB24
150.0000 mg | ORAL_TABLET | Freq: Every morning | ORAL | Status: DC
  Administered 2023-07-05 – 2023-07-09 (×5): 150 mg via ORAL
  Filled 2023-07-04 (×5): qty 1

## 2023-07-04 MED ORDER — FLUTICASONE PROPIONATE 50 MCG/ACT NA SUSP: 2.0000 | Freq: Every day | NASAL | Status: DC | PRN

## 2023-07-04 MED ORDER — ACETAMINOPHEN 500 MG PO TABS
500.0000 mg | ORAL_TABLET | Freq: Every day | ORAL | Status: DC
  Administered 2023-07-04 – 2023-07-08 (×5): 500 mg via ORAL
  Filled 2023-07-04 (×5): qty 1

## 2023-07-04 MED ORDER — VITAMIN D 25 MCG (1000 UNIT) PO TABS
2000.0000 [IU] | ORAL_TABLET | Freq: Every day | ORAL | Status: DC
  Administered 2023-07-04 – 2023-07-08 (×5): 2000 [IU] via ORAL
  Filled 2023-07-04 (×5): qty 2

## 2023-07-04 MED ORDER — ACETAMINOPHEN 500 MG PO TABS
500.0000 mg | ORAL_TABLET | Freq: Every day | ORAL | Status: DC | PRN
  Administered 2023-07-08: 500 mg via ORAL
  Filled 2023-07-04: qty 1

## 2023-07-04 MED ORDER — HYDROCODONE-ACETAMINOPHEN 10-325 MG PO TABS
1.0000 | ORAL_TABLET | Freq: Every day | ORAL | Status: DC
  Administered 2023-07-04 – 2023-07-08 (×5): 1 via ORAL
  Filled 2023-07-04 (×5): qty 1

## 2023-07-04 MED ORDER — DEXAMETHASONE SODIUM PHOSPHATE 4 MG/ML IJ SOLN
40.0000 mg | INTRAMUSCULAR | Status: DC
  Administered 2023-07-04: 40 mg via INTRAVENOUS
  Filled 2023-07-04: qty 10

## 2023-07-04 MED ORDER — LEVETIRACETAM 500 MG PO TABS
1500.0000 mg | ORAL_TABLET | Freq: Two times a day (BID) | ORAL | Status: DC
  Administered 2023-07-04 – 2023-07-09 (×10): 1500 mg via ORAL
  Filled 2023-07-04 (×10): qty 3

## 2023-07-04 MED ORDER — HYDROCODONE-ACETAMINOPHEN 10-325 MG PO TABS: 1.0000 | ORAL_TABLET | Freq: Every day | ORAL | Status: DC | PRN

## 2023-07-04 MED ORDER — IMMUNE GLOBULIN (HUMAN) 10 GM/100ML IV SOLN
1.0000 g/kg | INTRAVENOUS | Status: AC
  Administered 2023-07-04: 30 g via INTRAVENOUS
  Administered 2023-07-05: 40 g via INTRAVENOUS
  Administered 2023-07-06: 80 g via INTRAVENOUS
  Filled 2023-07-04 (×2): qty 800

## 2023-07-04 MED ORDER — ACETAMINOPHEN 500 MG PO TABS: 500.0000 mg | ORAL_TABLET | Freq: Every day | ORAL | Status: DC

## 2023-07-04 MED ORDER — VITAMIN D3 50 MCG (2000 UT) PO TABS: 2000.0000 [IU] | ORAL_TABLET | Freq: Every day | ORAL | Status: DC

## 2023-07-04 MED ORDER — HYDROCODONE-ACETAMINOPHEN 10-325 MG PO TABS: 1.0000 | ORAL_TABLET | ORAL | Status: DC

## 2023-07-04 MED ORDER — DEXAMETHASONE SODIUM PHOSPHATE 10 MG/ML IJ SOLN: 40.0000 mg | INTRAMUSCULAR | Status: DC

## 2023-07-04 MED ORDER — ROSUVASTATIN CALCIUM 20 MG PO TABS
40.0000 mg | ORAL_TABLET | Freq: Every day | ORAL | Status: DC
Start: 2023-07-04 — End: 2023-07-08
  Administered 2023-07-04 – 2023-07-07 (×4): 40 mg via ORAL
  Filled 2023-07-04 (×4): qty 2

## 2023-07-04 MED ORDER — AMLODIPINE BESYLATE 10 MG PO TABS
10.0000 mg | ORAL_TABLET | Freq: Every day | ORAL | Status: DC
  Administered 2023-07-06 – 2023-07-08 (×4): 10 mg via ORAL
  Filled 2023-07-04 (×4): qty 1

## 2023-07-04 NOTE — H&P (Signed)
 History and Physical    Mark [name] ZOX:[id number] DOB: 05-20-1945 DOA: 07/04/2023  PCP: Laurann Montana, MD  Patient coming from: Home via oncology center.  I have personally briefly reviewed patient's old medical records available.   Chief Complaint: Rashes in the body.  Epistaxis.  HPI: Mark [name] is a 78 y.o. male with medical history significant of ITP, lupus anticoagulant, coronary artery disease, hypertension, stroke with residual left sided hemiaplasia, hyperlipidemia, CKD stage IIIa, GERD and history of seizure presented from oncology clinic with severe thrombocytopenia.  Patient was recently admitted to the hospital and treated with 4 days of high-dose Decadron and discharged home with improvement of platelet counts.  He went to follow-up with Dr. Myna Hidalgo today and found to have platelets of 17 with petechiae and intermittent epistaxis at home so was sent to the ER. Patient is poor historian.  He tells me that he had some bleeding through his nose last night but none since then.  Patient not sure when he started seeing the pigmentation on his arms and legs.  Denies any hematemesis, hemoptysis, hematochezia.  He denies any other symptoms including loss of appetite, headache nausea vomiting.  Patient continues to take Xarelto and aspirin. ED Course: Hemodynamically stable.  On room air.  Platelets 17,000.  Discussed with Dr. Myna Hidalgo.  He was started on IVIG, high-dose Decadron and also given a dose of Nplate.  Admission requested for ongoing treatment.  Review of Systems: all systems are reviewed and pertinent positive as per HPI otherwise rest are negative.    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    Lupus anticoagulant disorder (HCC)    Lupus anticoagulant syndrome (HCC)    Multiple closed anterior-posterior compression fractures of pelvis (HCC)    Seizures (HCC)    last seizure 10 years ago approx    Sleep apnea    cpap broken x 1 year per wife    Stroke (HCC) 02/27/2011   Left side weakness    Past Surgical History:  Procedure Laterality Date   blood clot Right    surgical removal   CHOLECYSTECTOMY     CORONARY ANGIOPLASTY  1992   CYSTOGRAM N/A 02/17/2020   Procedure: CYSTOGRAM;  Surgeon: Crist Fat, MD;  Location: [facility];  Service: Urology;  Laterality: N/A;   CYSTOSCOPY WITH RETROGRADE PYELOGRAM, URETEROSCOPY AND STENT PLACEMENT Bilateral 02/17/2020   Procedure: Effie Shy  URETEROSCOPY AND STENT PLACEMENT;  Surgeon: Crist Fat, MD;  Location: [facility];  Service: Urology;  Laterality: Bilateral;   SPLENECTOMY, TOTAL     vertebralplasty      Social history   reports that he quit smoking about 38 years ago. His smoking use included cigarettes. He started smoking about 55 years ago. He has a 16.6 pack-year smoking history. He has never used smokeless tobacco. He reports that he does not currently use drugs. He reports that he does not drink alcohol.  Allergies  Allergen Reactions   Diphenhydramine Other (See Comments)    Affects kidneys   Famotidine Other (See  Comments)    Affects kidneys   Other Other (See Comments)    Patient is a VEGETARIAN    History reviewed. No pertinent family history.   Prior to Admission medications   Medication Sig Start Date End Date Taking? Authorizing Provider  amLODipine (NORVASC) 10 MG tablet Take 10 mg by mouth at bedtime. 02/16/23   [provider]  aspirin EC 81 MG tablet Take 81 mg by mouth daily. Swallow whole.    [provider]  baclofen (LIORESAL) 10 MG tablet Take 10 mg by mouth in the morning  and at bedtime.    [provider]  buPROPion (WELLBUTRIN XL) 150 MG 24 hr tablet Take 150 mg by mouth in the morning. 12/19/14   [provider]  Cholecalciferol (VITAMIN D3) 50 MCG (2000 UT) TABS Take 2,000 Units by mouth at bedtime.    [provider]  dexamethasone (DECADRON) 4 MG tablet TAKE 10 TABLET BY MOUTH ONCE DAILY AS NEEDED FOR 3 DAYS FOR  ITP Patient taking differently: Take 40 mg by mouth See admin instructions. TAKE 40 MG. BY MOUTH ONCE DAILY AS NEEDED FOR 3 DAYS FOR ITP 05/21/23   Josph Macho, MD  fluticasone [facility]) 50 MCG/ACT nasal spray Place 2 sprays into both nostrils daily as needed for allergies or rhinitis. 06/23/14   [provider]  gabapentin (NEURONTIN) 100 MG capsule Take 100 mg by mouth at bedtime. 06/26/23   [provider]  HYDROcodone-acetaminophen (NORCO) 10-325 MG tablet Take 1 tablet by mouth See admin instructions. Take 1 by mouth at bedtime and an additional 1 tablet once a day as needed for pain 06/06/23   [provider]  levETIRAcetam (KEPPRA) 500 MG tablet Take 1,500 mg by mouth in the morning and at bedtime. 12/24/11   [provider]  omeprazole (PRILOSEC) 40 MG capsule Take 40 mg by mouth daily before breakfast. 08/01/19   [provider]  polyethylene glycol (MIRALAX / GLYCOLAX) packet Take 17 g by mouth daily as needed. Patient taking differently: Take 17 g by mouth daily as needed for mild constipation. 10/25/16   Zannie Cove, MD  rivaroxaban (XARELTO) 20 MG TABS tablet Take 20 mg by mouth daily with supper. 10/21/19   [provider]  rosuvastatin (CRESTOR) 40 MG tablet Take 40 mg by mouth at bedtime.    [provider]  sertraline (ZOLOFT) 100 MG tablet Take 200 mg by mouth in the morning.     [provider]  TYLENOL 500 MG tablet Take 500-1,000 mg by mouth at bedtime.    [provider]  XARELTO 20 MG TABS tablet Take 1 tablet by mouth once  daily with breakfast Patient taking differently: Take 20 mg by mouth daily with breakfast. 10/21/19   Josph Macho, MD    Physical Exam: Vitals:   07/04/23 1517  BP: 127/88  Pulse: 77  Resp: 18  Temp: 98.1 F (36.7 C)  TempSrc: Oral  SpO2: 96%  Weight: 79.4 kg  Height: 6' (1.829 m)    Constitutional: NAD, calm, comfortable Vitals:   07/04/23 1517  BP: 127/88  Pulse: 77  Resp: 18  Temp: 98.1 F (36.7 C)  TempSrc: Oral  SpO2: 96%  Weight: 79.4 kg  Height: 6' (1.829 m)   Eyes: PERRL, lids and conjunctivae normal ENMT: Mucous membranes are dry. Posterior pharynx clear of any exudate or lesions.Normal dentition.  Does not have any evidence of active bleeding. Neck: normal, supple, no masses, no thyromegaly  Respiratory: clear to auscultation bilaterally, no wheezing, no crackles. Normal respiratory effort. No accessory muscle use.  Cardiovascular: Regular rate and rhythm, no murmurs / rubs / gallops. No extremity edema. 2+ pedal pulses. No carotid bruits.  Abdomen: no tenderness, no masses palpated. No hepatosplenomegaly. Bowel sounds positive.  Musculoskeletal: no clubbing / cyanosis.  Spastic hemiplegia on the left upper and lower extremity. Skin: patechial rashes both anterior elbow, no evidence of active bleeding.  No ulcers. No induration Neurologic: CN 2-12 grossly intact. Sensation intact, DTR normal.  Left hemiaplasia. Psychiatric: Normal judgment and insight. Alert and oriented x 3. Normal mood.  Flat affect.    Labs on Admission: I have personally reviewed following labs and imaging studies  CBC: Recent Labs  Lab 07/04/23 1327  WBC 10.6*  NEUTROABS 5.3  HGB 12.9*  HCT 39.7  MCV 92.3  PLT 17*   Basic Metabolic Panel: Recent Labs  Lab 07/04/23 1334  NA 140  K 4.7  CL 105  CO2 27  GLUCOSE 106*  BUN 24*  CREATININE 1.30*  CALCIUM 9.6   GFR: Estimated Creatinine Clearance: 51.4 mL/min (A) (by C-G formula based on SCr of 1.3 mg/dL (H)). Liver  Function Tests: Recent Labs  Lab 07/04/23 1334  AST 35  ALT 32  ALKPHOS 70  BILITOT 0.4  PROT 7.8  ALBUMIN 4.3   No results for input(s): "LIPASE", "AMYLASE" in the last 168 hours. No results for input(s): "AMMONIA" in the last 168 hours. Coagulation Profile: No results for input(s): "INR", "PROTIME" in the last 168 hours. Cardiac Enzymes: No results for input(s): "CKTOTAL", "CKMB", "CKMBINDEX", "TROPONINI" in the last 168 hours. BNP (last 3 results) No results for input(s): "PROBNP" in the last 8760 hours. HbA1C: No results for input(s): "HGBA1C" in the last 72 hours. CBG: No results for input(s): "GLUCAP" in the last 168 hours. Lipid Profile: No results for input(s): "CHOL", "HDL", "LDLCALC", "TRIG", "CHOLHDL", "LDLDIRECT" in the last 72 hours. Thyroid Function Tests: No results for input(s): "TSH", "T4TOTAL", "FREET4", "T3FREE", "THYROIDAB" in the last 72 hours. Anemia Panel: No results for input(s): "VITAMINB12", "FOLATE", "FERRITIN", "TIBC", "IRON", "RETICCTPCT" in the last 72 hours. Urine analysis:    Component Value Date/Time   COLORURINE YELLOW 06/29/2020 2310   APPEARANCEUR HAZY (A) 06/29/2020 2310   LABSPEC 1.010 06/29/2020 2310   PHURINE 6.0 06/29/2020 2310   GLUCOSEU NEGATIVE 06/29/2020 2310   HGBUR SMALL (A) 06/29/2020 2310   BILIRUBINUR NEGATIVE 06/29/2020 2310   KETONESUR NEGATIVE 06/29/2020 2310   PROTEINUR NEGATIVE 06/29/2020 2310   UROBILINOGEN 1.0 04/04/2010 0110   NITRITE NEGATIVE 06/29/2020 2310   LEUKOCYTESUR MODERATE (A) 06/29/2020 2310    Radiological Exams on Admission: No results found.   Assessment/Plan Principal Problem:   Acute ITP (HCC) Active Problems:   Ischemic stroke (HCC)   Hemiparesis, left (HCC)     Acute ITP, relapse: Admit.  Hold Xarelto and aspirin. 1 dose of Nplate, Decadron 40 mg IV daily for 3 days, IVIG for 4 days.  Continue close monitoring for bleeding and platelet counts.  Oncology to follow-up, likely Rituxan  before discharge.  2.  Ischemic stroke with left hemiaplasia, lupus anticoagulant positive, history of DVT: Currently unable to anticoagulate.  SCDs.  Discontinue Xarelto and aspirin.  PT OT to work with him.  3.  Essential hypertension: Blood pressure stable.  Continue amlodipine.  4.  CKD stage IIIa: At about baseline.  5.  History of seizure disorder: On Keppra 1500 mg twice daily.  Continue.  6.  Anxiety depression: Takes bupropion and sertraline.  Continue.    DVT prophylaxis: SCDs Code Status: DNR with limited intervention, confirmed by patient. Family Communication: None at the bedside Disposition Plan: Home when stable Consults called: Oncology, already seen Admission status: Inpatient.  Telemetry monitor.   Dorcas Carrow MD [facility]

## 2023-07-04 NOTE — ED Triage Notes (Signed)
 Petechiae started showing all over body last night, bruising under left eye. No injury. Pt is on xarelto, did not take dose today. Labs drawn today, platelets less than 5000.

## 2023-07-04 NOTE — ED Provider Notes (Signed)
 [place] EMERGENCY DEPARTMENT AT [facility] Provider Note   CSN: [id number] Arrival date & time: 07/04/23  1502     History  Chief Complaint  Patient presents with   Abnormal Labs    Low Platelets    Mark [name] is a 78 y.o. male.  Patient is a 78 year old male with a past medical history of ITP, prior CVA, prior DVT on Xarelto presenting to the emergency department with thrombocytopenia.  Patient is here with his wife who states over the last 24 hours or so he has noticed significant petechiae.  He states that he has had some nosebleeds over the last day.  He denies any bleeding from the gums or black or bloody stools.  He was seen by his hematologist today who recommended he come to the emergency department for admission for IVIG.  He states he has not taken his blood thinner today.  The history is provided by the patient and the spouse.       Home Medications Prior to Admission medications   Medication Sig Start Date End Date Taking? Authorizing Provider  amLODipine (NORVASC) 10 MG tablet Take 10 mg by mouth at bedtime. 02/16/23   [provider]  aspirin EC 81 MG tablet Take 81 mg by mouth daily. Swallow whole.    [provider]  baclofen (LIORESAL) 10 MG tablet Take 10 mg by mouth in the morning and at bedtime.    [provider]  buPROPion (WELLBUTRIN XL) 150 MG 24 hr tablet Take 150 mg by mouth in the morning. 12/19/14   [provider]  Cholecalciferol (VITAMIN D3) 50 MCG (2000 UT) TABS Take 2,000 Units by mouth at bedtime.    [provider]  dexamethasone (DECADRON) 4 MG tablet TAKE 10 TABLET BY MOUTH ONCE DAILY AS NEEDED FOR 3 DAYS FOR  ITP Patient taking differently: Take 40 mg by mouth See admin instructions. TAKE 40 MG. BY MOUTH ONCE DAILY AS NEEDED FOR 3 DAYS FOR ITP 05/21/23   Josph Macho, MD  fluticasone [facility]) 50 MCG/ACT nasal spray Place 2 sprays into both nostrils daily as needed for allergies or  rhinitis. 06/23/14   [provider]  gabapentin (NEURONTIN) 100 MG capsule Take 100 mg by mouth at bedtime. 06/26/23   [provider]  HYDROcodone-acetaminophen (NORCO) 10-325 MG tablet Take 1 tablet by mouth See admin instructions. Take 1 by mouth at bedtime and an additional 1 tablet once a day as needed for pain 06/06/23   [provider]  levETIRAcetam (KEPPRA) 500 MG tablet Take 1,500 mg by mouth in the morning and at bedtime. 12/24/11   [provider]  omeprazole (PRILOSEC) 40 MG capsule Take 40 mg by mouth daily before breakfast. 08/01/19   [provider]  polyethylene glycol (MIRALAX / GLYCOLAX) packet Take 17 g by mouth daily as needed. Patient taking differently: Take 17 g by mouth daily as needed for mild constipation. 10/25/16   Zannie Cove, MD  rivaroxaban (XARELTO) 20 MG TABS tablet Take 20 mg by mouth daily with supper. 10/21/19   [provider]  rosuvastatin (CRESTOR) 40 MG tablet Take 40 mg by mouth at bedtime.    [provider]  sertraline (ZOLOFT) 100 MG tablet Take 200 mg by mouth in the morning.     [provider]  TYLENOL 500 MG tablet Take 500-1,000 mg by mouth at bedtime.    [provider]  XARELTO 20 MG TABS tablet Take 1  tablet by mouth once daily with breakfast Patient taking differently: Take 20 mg by mouth daily with breakfast. 10/21/19   Josph Macho, MD      Allergies    Diphenhydramine, Famotidine, and Other    Review of Systems   Review of Systems  Physical Exam Updated Vital Signs BP 127/88 (BP Location: Right Arm)   Pulse 77   Temp 98.1 F (36.7 C) (Oral)   Resp 18   Ht 6' (1.829 m)   Wt 79.4 kg   SpO2 96%   BMI 23.73 kg/m  Physical Exam Vitals and nursing note reviewed.  Constitutional:      General: He is not in acute distress.    Appearance: Normal appearance.  HENT:     Head: Normocephalic and atraumatic.     Nose:     Comments: Scabbing to R anterior  nare without active bleeding    Mouth/Throat:     Mouth: Mucous membranes are moist.     Pharynx: Oropharynx is clear.  Eyes:     Extraocular Movements: Extraocular movements intact.     Conjunctiva/sclera: Conjunctivae normal.  Cardiovascular:     Rate and Rhythm: Normal rate and regular rhythm.     Heart sounds: Normal heart sounds.  Pulmonary:     Effort: Pulmonary effort is normal.     Breath sounds: Normal breath sounds.  Abdominal:     General: Abdomen is flat.     Palpations: Abdomen is soft.     Tenderness: There is no abdominal tenderness.  Musculoskeletal:        General: Normal range of motion.     Cervical back: Normal range of motion.  Skin:    General: Skin is warm and dry.     Comments: Petechiae on chest wall, forearms, LE  Neurological:     General: No focal deficit present.     Mental Status: He is alert and oriented to person, place, and time.  Psychiatric:        Mood and Affect: Mood normal.        Behavior: Behavior normal.     ED Results / Procedures / Treatments   Labs (all labs ordered are listed, but only abnormal results are displayed) Labs Reviewed - No data to display  EKG None  Radiology No results found.  Procedures Procedures    Medications Ordered in ED Medications - No data to display  ED Course/ Medical Decision Making/ A&P                                 Medical Decision Making This patient presents to the ED with chief complaint(s) of thrombocytopenia with pertinent past medical history of ITP, prior CVA, prior DVT on Xarelto which further complicates the presenting complaint. The complaint involves an extensive differential diagnosis and also carries with it a high risk of complications and morbidity.    The differential diagnosis includes thrombocytopenia, anemia, bleeding, coagulopathy  Additional history obtained: Additional history obtained from spouse Records reviewed outpatient hematology records  ED Course and  Reassessment: On patient's arrival he is hemodynamically stable in no acute distress.  I did review his labs done at hematology this morning that did show acute on chronic thrombocytopenia, labs were otherwise at baseline.  Hematology recommended admission for IVIG and will plan to consult hospitalist for further management.  Independent labs interpretation:  N/a  Independent visualization of imaging: -n/a  Consultation: - Consulted or discussed management/test interpretation w/ external professional: Hospitalist  Consideration for admission or further workup: patient requires admission for ITP Social Determinants of health: N/A            Final Clinical Impression(s) / ED Diagnoses Final diagnoses:  Acute ITP (HCC)    Rx / DC Orders ED Discharge Orders     None         Rexford Maus, DO 07/04/23 1632

## 2023-07-04 NOTE — Progress Notes (Signed)
 Hematology and Oncology Follow Up Visit  BRIDGET WESTBROOKS [id number] [birth year] 78 y.o. 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 2. 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 3. Positive lupus anticoagulant.   Current Therapy:        Xarelto 20 mg PO daily/ EC ASA 81 mg po q day   Interim History:  Mr. Joos is here today with his wife for follow-up.  Unfortunately, we have another relapse of his ITP.  His platelet count is dropping quickly.  His platelet count is 15,000.  He is having more bruising.  He is having some nosebleed.  Unfortunately, his wife is not able to bring him back and forth to the office for treatment.  As such, is glad to be hospitalized again.  They were going to have to utilize Rituxan at this point.  I think were going to have to try to get a prolonged response if possible.  He has had no problems with nausea or vomiting.  He is been on Xarelto and aspirin.  Will have to get these stopped.  He has had no issues with fever.  His appetite has been okay.  Overall, I would say his performance status is probably ECOG 2.    Wt Readings from Last 3 Encounters:  06/09/23 175 lb (79.4 kg)  01/16/23 179 lb 1.9 oz (81.2 kg)  05/23/21 186 lb 12.8 oz (84.7 kg)   Medications:  Allergies as of 07/04/2023       Reactions   Diphenhydramine Other (See Comments)   Affects kidneys   Famotidine Other (See Comments)   Affects kidneys   Other Other (See Comments)   Patient is a VEGETARIAN        Medication List        Accurate as of July 04, 2023  2:38 PM. If you have any questions, ask your nurse or doctor.          STOP taking these medications    valACYclovir 1000 MG tablet Commonly known as: VALTREX Stopped by: Josph Macho       TAKE these medications    amLODipine 10 MG tablet Commonly known as: NORVASC Take 10 mg by mouth at bedtime.   aspirin EC 81 MG  tablet Take 81 mg by mouth daily. Swallow whole.   baclofen 10 MG tablet Commonly known as: LIORESAL Take 10 mg by mouth in the morning and at bedtime.   buPROPion 150 MG 24 hr tablet Commonly known as: WELLBUTRIN XL Take 150 mg by mouth in the morning.   dexamethasone 4 MG tablet Commonly known as: DECADRON TAKE 10 TABLET BY MOUTH ONCE DAILY AS NEEDED FOR 3 DAYS FOR  ITP What changed:  how much to take how to take this when to take this additional instructions   fluticasone 50 MCG/ACT nasal spray Commonly known as: FLONASE Place 2 sprays into both nostrils daily as needed for allergies or rhinitis.   gabapentin 100 MG capsule Commonly known as: NEURONTIN Take 100 mg by mouth at bedtime.   HYDROcodone-acetaminophen 10-325 MG tablet Commonly known as: NORCO Take 1 tablet by mouth See admin instructions. Take 1 by mouth at bedtime and an additional 1 tablet once a day as needed for pain   levETIRAcetam 500 MG tablet Commonly known as: KEPPRA Take 1,500 mg by mouth in the morning and at bedtime.   omeprazole 40 MG capsule Commonly known as: PRILOSEC Take 40 mg by mouth daily before  breakfast.   polyethylene glycol 17 g packet Commonly known as: MIRALAX / GLYCOLAX Take 17 g by mouth daily as needed. What changed: reasons to take this   rosuvastatin 40 MG tablet Commonly known as: CRESTOR Take 40 mg by mouth at bedtime.   sertraline 100 MG tablet Commonly known as: ZOLOFT Take 200 mg by mouth in the morning.   TYLENOL 500 MG tablet Generic drug: acetaminophen Take 500-1,000 mg by mouth at bedtime.   Vitamin D3 50 MCG (2000 UT) Tabs Take 2,000 Units by mouth at bedtime.   Xarelto 20 MG Tabs tablet Generic drug: rivaroxaban Take 1 tablet by mouth once daily with breakfast What changed: how much to take   rivaroxaban 20 MG Tabs tablet Commonly known as: XARELTO Take 20 mg by mouth daily with supper. What changed: Another medication with the same name was  changed. Make sure you understand how and when to take each.        Allergies:  Allergies  Allergen Reactions   Diphenhydramine Other (See Comments)    Affects kidneys   Famotidine Other (See Comments)    Affects kidneys   Other Other (See Comments)    Patient is a VEGETARIAN    Past Medical History, Surgical history, Social history, and Family History were reviewed and updated.  Review of Systems: Review of Systems  Constitutional:  Negative for weight loss.  HENT: Negative.    Eyes: Negative.   Respiratory: Negative.    Cardiovascular: Negative.   Gastrointestinal: Negative.   Genitourinary: Negative.   Musculoskeletal: Negative.   Skin:  Positive for rash.  Neurological:  Negative for focal weakness.  Endo/Heme/Allergies:  Does not bruise/bleed easily.  Psychiatric/Behavioral: Negative.       Physical Exam:  Vital signs show temperature 98.7.  Pulse 77.  Blood pressure 120/73.  Weight was not taken.  Wt Readings from Last 3 Encounters:  06/09/23 175 lb (79.4 kg)  01/16/23 179 lb 1.9 oz (81.2 kg)  05/23/21 186 lb 12.8 oz (84.7 kg)    Physical Exam Vitals reviewed.  Constitutional:      Comments: Sitting comfortably in wheelchair  HENT:     Head: Normocephalic and atraumatic.     Nose:     Comments: Scant dry crusted blood of internal nostrils Eyes:     Pupils: Pupils are equal, round, and reactive to light.  Cardiovascular:     Rate and Rhythm: Normal rate and regular rhythm.     Heart sounds: Normal heart sounds.  Pulmonary:     Effort: Pulmonary effort is normal.     Breath sounds: Normal breath sounds.  Abdominal:     General: Bowel sounds are normal.     Palpations: Abdomen is soft.  Musculoskeletal:        General: No tenderness or deformity. Normal range of motion.     Cervical back: Normal range of motion.  Lymphadenopathy:     Cervical: No cervical adenopathy.  Skin:    General: Skin is warm and dry.     Findings: Bruising (Resolving  scattered moderately sized areas of ecchymosis  of the arms), lesion (slow weeping large lesion of the left internal mouth) and rash present. No erythema.  Neurological:     Mental Status: He is alert and oriented to person, place, and time.     Comments: Neurological exam shows a continued weakness over on the left side.  Psychiatric:        Behavior: Behavior normal.  Thought Content: Thought content normal.        Judgment: Judgment normal.      Lab Results  Component Value Date   WBC 10.6 (H) 07/04/2023   HGB 12.9 (L) 07/04/2023   HCT 39.7 07/04/2023   MCV 92.3 07/04/2023   PLT 17 (L) 07/04/2023   Lab Results  Component Value Date   FERRITIN 1,063 (H) 10/23/2016   IRON 15 (L) 06/30/2020   TIBC 196 (L) 06/30/2020   UIBC 181 06/30/2020   IRONPCTSAT 8 (L) 06/30/2020   Lab Results  Component Value Date   RETICCTPCT 1.5 08/20/2010   RBC 4.30 07/04/2023   RETICCTABS 75.3 08/20/2010   No results found for: "KPAFRELGTCHN", "LAMBDASER", "KAPLAMBRATIO" No results found for: "IGGSERUM", "IGA", "IGMSERUM" No results found for: "TOTALPROTELP", "ALBUMINELP", "A1GS", "A2GS", "BETS", "BETA2SER", "GAMS", "MSPIKE", "SPEI"   Chemistry      Component Value Date/Time   NA 140 07/04/2023 1334   NA 146 (H) 04/09/2017 1405   NA 138 05/09/2016 1321   K 4.7 07/04/2023 1334   K 4.0 04/09/2017 1405   K 4.3 05/09/2016 1321   CL 105 07/04/2023 1334   CL 108 04/09/2017 1405   CO2 27 07/04/2023 1334   CO2 23 04/09/2017 1405   CO2 22 05/09/2016 1321   BUN 24 (H) 07/04/2023 1334   BUN 34 (H) 04/09/2017 1405   BUN 36.4 (H) 05/09/2016 1321   CREATININE 1.30 (H) 07/04/2023 1334   CREATININE 1.3 (H) 04/09/2017 1405   CREATININE 1.5 (H) 05/09/2016 1321      Component Value Date/Time   CALCIUM 9.6 07/04/2023 1334   CALCIUM 9.1 04/09/2017 1405   CALCIUM 9.9 05/09/2016 1321   ALKPHOS 70 07/04/2023 1334   ALKPHOS 61 04/09/2017 1405   ALKPHOS 103 05/09/2016 1321   AST 35 07/04/2023  1334   AST 39 (H) 05/09/2016 1321   ALT 32 07/04/2023 1334   ALT 57 (H) 04/09/2017 1405   ALT 64 (H) 05/09/2016 1321   BILITOT 0.4 07/04/2023 1334   BILITOT 0.49 05/09/2016 1321       Impression and Plan: Mr. Friday is a very pleasant 78 yo caucasian gentleman with ITP.  He has occasional relapses for which he gets high-dose Decadron for 3 days which works well for him.  He had a very severe relapse.  Again here he responded incredibly well.    He unfortunately now has had a another relapse.  Again, had to be hospitalized.  I will give him a dose of Decadron.  He will get IVIG over the weekend.  I will then have to see about giving him Rituxan.  Hopefully, we can give him Rituxan in the hospital.  He will get a dose of Nplate.  We will see him in the hospital.  I called the ER at [facility] to let them know that he is arriving.     Josph Macho, MD 3/21/20252:38 PM

## 2023-07-05 DIAGNOSIS — D693 Immune thrombocytopenic purpura: Secondary | ICD-10-CM | POA: Diagnosis not present

## 2023-07-05 LAB — TYPE AND SCREEN
ABO/RH(D): A POS
Antibody Screen: NEGATIVE

## 2023-07-05 LAB — CBC
HCT: 33.6 % — ABNORMAL LOW (ref 39.0–52.0)
Hemoglobin: 11 g/dL — ABNORMAL LOW (ref 13.0–17.0)
MCH: 30.7 pg (ref 26.0–34.0)
MCHC: 32.7 g/dL (ref 30.0–36.0)
MCV: 93.9 fL (ref 80.0–100.0)
Platelets: 14 10*3/uL — CL (ref 150–400)
RBC: 3.58 MIL/uL — ABNORMAL LOW (ref 4.22–5.81)
RDW: 16.9 % — ABNORMAL HIGH (ref 11.5–15.5)
WBC: 8.9 10*3/uL (ref 4.0–10.5)
nRBC: 0.2 % (ref 0.0–0.2)

## 2023-07-05 MED ORDER — OXYMETAZOLINE HCL 0.05 % NA SOLN
1.0000 | NASAL | Status: DC | PRN
  Filled 2023-07-05: qty 15

## 2023-07-05 MED ORDER — FUROSEMIDE 10 MG/ML IJ SOLN
20.0000 mg | Freq: Once | INTRAMUSCULAR | Status: AC
  Administered 2023-07-05: 20 mg via INTRAVENOUS
  Filled 2023-07-05: qty 2

## 2023-07-05 MED ORDER — NYSTATIN 100000 UNIT/ML MT SUSP
5.0000 mL | Freq: Four times a day (QID) | OROMUCOSAL | Status: DC
  Administered 2023-07-05 – 2023-07-09 (×16): 500000 [IU] via ORAL
  Filled 2023-07-05 (×17): qty 5

## 2023-07-05 MED ORDER — SODIUM CHLORIDE 0.9 % IV SOLN
40.0000 mg | Freq: Every day | INTRAVENOUS | Status: AC
  Administered 2023-07-05 – 2023-07-07 (×3): 40 mg via INTRAVENOUS
  Filled 2023-07-05: qty 4
  Filled 2023-07-05 (×2): qty 1

## 2023-07-05 MED ORDER — SODIUM CHLORIDE 0.9% IV SOLUTION: Freq: Once | INTRAVENOUS | Status: AC

## 2023-07-05 NOTE — Progress Notes (Signed)
 Mobility Specialist - Progress Note   07/05/23 1551  Mobility  Activity Stood at bedside  Level of Assistance Contact guard assist, steadying assist  Activity Response Tolerated well  Mobility Referral Yes  Mobility visit 1 Mobility  Mobility Specialist Stop Time (ACUTE ONLY) 1551   Pt received in bed and agreeable to mobility. Once standing, pt unable to step forward or backwards. Opted out to do x3 STS. No complaints during session. Pt to bed after session with all needs met. Wife in room.   [facility]

## 2023-07-05 NOTE — Progress Notes (Signed)
 PROGRESS NOTE    Mark [name]  ONG:[id number] DOB: 1945/05/29 DOA: 07/04/2023 PCP: Laurann Montana, MD   Brief Narrative: 78 year old with past medical history significant for ITP, lupus anticoagulant, coronary artery disease, hypertension, stroke with residual left-sided Hemi paresis, hyperlipidemia, CKD stage IIIa, GERD, history of seizure presented to the oncology clinic with severe thrombocytopenia.  Patient recently admitted to the hospital and treated with 4 days of high-dose Decadron and discharged home with improvement in platelet counts.  He saw Dr. Myna Hidalgo at the clinic and was found to have platelet counts again of 17, admitted for further treatment and evaluation.    Assessment & Plan:   Principal Problem:   Acute ITP (HCC) Active Problems:   Ischemic stroke (HCC)   Hemiparesis, left (HCC)   1-Acute ITP with relapse: -Present with recurrent thrombocytopenia -Dr. Myna Hidalgo following and recommended a dose of Nplate. -Decadron 40 mg IV daily for 3 days and IVIG over weekend.  -Oncology considering Rituxan.  Recurrent episode of epistaxis. Discussed with Dr Myna Hidalgo, plan to proceed with platelet transfusion.   2-History of ischemic stroke with left Hemiparesis, lupus anticoagulant positive, history of DVT: -Continue to hold Xarelto and aspirin due to high risk for bleeding in the setting of severe thrombocytopenia   Essential hypertension: Continue amlodipine  CKD 3A: Continue to monitor Cr baseline 1.3  History of seizure: Continue with Keppra twice daily  Anxiety, Depression: Continue bupropion and sertraline      Estimated body mass index is 23.73 kg/m as calculated from the following:   Height as of this encounter: 6' (1.829 m).   Weight as of this encounter: 79.4 kg.   DVT prophylaxis: SCD Code Status: DNR Family Communication: daughter at bedside.  Disposition Plan:  Status is: Inpatient Remains inpatient appropriate because: management of  ITP    Consultants:  Oncology   Procedures:    Antimicrobials:    Subjective: He is alert, he has chronic left arm weakness, it has been stable.  He is requiring more assistance with mobility last few days  Objective: Vitals:   07/04/23 2341 07/05/23 0045 07/05/23 0136 07/05/23 0622  BP: 137/85 115/74 125/86 137/82  Pulse: 88 87 84 85  Resp: 18 18 17 18   Temp: 99 F (37.2 C) 98.6 F (37 C) 97.9 F (36.6 C) 98.4 F (36.9 C)  TempSrc: Oral Oral Oral Oral  SpO2: 96% 95% 97% 93%  Weight:      Height:        Intake/Output Summary (Last 24 hours) at 07/05/2023 0651 Last data filed at 07/05/2023 4401 Gross per 24 hour  Intake 1170.5 ml  Output 1200 ml  Net -29.5 ml   Filed Weights   07/04/23 1517  Weight: 79.4 kg    Examination:  General exam: Appears calm and comfortable  Respiratory system: Clear to auscultation. Respiratory effort normal. Cardiovascular system: S1 & S2 heard, RRR. No JVD, murmurs, rubs, gallops or clicks. No pedal edema. Gastrointestinal system: Abdomen is nondistended, soft and nontender.  Central nervous system: Alert and oriented. Left Upper extremity hemiparesis.  Extremities: Symmetric 5 x 5 power.    Data Reviewed: I have personally reviewed following labs and imaging studies  CBC: Recent Labs  Lab 07/04/23 1327 07/05/23 0529  WBC 10.6* 8.9  NEUTROABS 5.3  --   HGB 12.9* 11.0*  HCT 39.7 33.6*  MCV 92.3 93.9  PLT 17* 14*   Basic Metabolic Panel: Recent Labs  Lab 07/04/23 1334  NA 140  K 4.7  CL 105  CO2 27  GLUCOSE 106*  BUN 24*  CREATININE 1.30*  CALCIUM 9.6   GFR: Estimated Creatinine Clearance: 51.4 mL/min (A) (by C-G formula based on SCr of 1.3 mg/dL (H)). Liver Function Tests: Recent Labs  Lab 07/04/23 1334  AST 35  ALT 32  ALKPHOS 70  BILITOT 0.4  PROT 7.8  ALBUMIN 4.3   No results for input(s): "LIPASE", "AMYLASE" in the last 168 hours. No results for input(s): "AMMONIA" in the last 168  hours. Coagulation Profile: No results for input(s): "INR", "PROTIME" in the last 168 hours. Cardiac Enzymes: No results for input(s): "CKTOTAL", "CKMB", "CKMBINDEX", "TROPONINI" in the last 168 hours. BNP (last 3 results) No results for input(s): "PROBNP" in the last 8760 hours. HbA1C: No results for input(s): "HGBA1C" in the last 72 hours. CBG: No results for input(s): "GLUCAP" in the last 168 hours. Lipid Profile: No results for input(s): "CHOL", "HDL", "LDLCALC", "TRIG", "CHOLHDL", "LDLDIRECT" in the last 72 hours. Thyroid Function Tests: No results for input(s): "TSH", "T4TOTAL", "FREET4", "T3FREE", "THYROIDAB" in the last 72 hours. Anemia Panel: No results for input(s): "VITAMINB12", "FOLATE", "FERRITIN", "TIBC", "IRON", "RETICCTPCT" in the last 72 hours. Sepsis Labs: No results for input(s): "PROCALCITON", "LATICACIDVEN" in the last 168 hours.  No results found for this or any previous visit (from the past 240 hours).       Radiology Studies: No results found.      Scheduled Meds:  acetaminophen  500 mg Oral QHS   amLODipine  10 mg Oral QHS   baclofen  10 mg Oral BID   buPROPion  150 mg Oral q AM   cholecalciferol  2,000 Units Oral QHS   dexamethasone (DECADRON) injection  40 mg Intravenous Q24H   gabapentin  100 mg Oral QHS   HYDROcodone-acetaminophen  1 tablet Oral QHS   levETIRAcetam  1,500 mg Oral BID   nystatin  5 mL Oral QID   pantoprazole  40 mg Oral Daily   rosuvastatin  40 mg Oral QHS   sertraline  200 mg Oral q AM   Continuous Infusions:   LOS: 1 day    Time spent: 35 minutes    Aidan Caloca A Velinda Wrobel, MD [facility]   If 7PM-7AM, please contact night-coverage [email or web address]  07/05/2023, 6:51 AM

## 2023-07-05 NOTE — Progress Notes (Signed)
 Patient was noticed to have blood stains on his fingers and scattered kleenex with blood on them as well, when asked where is the blood coming from patient stated " I have a habit of picking my nose". Patient was then advised not to as he is prone to bleeding.   This RN came to fixed patient's IV beeping and noticed blood on patient's right nostril all the way down to his neck and when asked he stated " I'm sorry I can't help it".Patient had to be cleaned twice more from his nose bleed right nostril is packed with 4x4 gauze to try to stop the bleeding.Patient was provided with more education regarding the risk of bleeding and he shows understanding.

## 2023-07-05 NOTE — Progress Notes (Signed)
 Date and time results received: 07/05/23 0545 am (use smartphrase ".now" to insert current time)  Test: CBC Critical Value: Platelet 14  Name of Provider Notified: Anthoney Harada, NP  Orders Received? Or Actions Taken?:

## 2023-07-05 NOTE — Evaluation (Signed)
 Occupational Therapy Evaluation Patient Details Name: Mark [name] MRN: [id number] DOB: Mar 27, 1946 Today's Date: 07/05/2023   History of Present Illness   Mark [name] is a 78 y.o. male with medical history significant of ITP, lupus anticoagulant, coronary artery disease, hypertension, stroke with residual left sided hemiaplasia, hyperlipidemia, CKD stage IIIa, GERD and history of seizure presented from oncology clinic with severe thrombocytopenia.  Patient was recently admitted to the hospital and treated with 4 days of high-dose Decadron and discharged home with improvement of platelet counts.  He went to follow-up with Dr. Myna Hidalgo today and found to have platelets of 17 with petechiae and intermittent epistaxis at home so was sent to the ER.     Clinical Impressions Pt admitted with the above. Pt currently with functional limitations due to the deficits listed below (see OT Problem List).  Pt will benefit from acute skilled OT to increase their safety and independence with ADL and functional mobility for ADL to facilitate discharge.    Pts daughter present and very supportive but realizes pt needs increased level of care.  Interested in SNF for rehab. Pt has been to [facility] before for rehab    If plan is discharge home, recommend the following:   A lot of help with walking and/or transfers;A lot of help with bathing/dressing/bathroom;Assistance with cooking/housework;Assistance with feeding;Direct supervision/assist for medications management;Direct supervision/assist for financial management;Assist for transportation;Help with stairs or ramp for entrance;Supervision due to cognitive status     Functional Status Assessment   Patient has had a recent decline in their functional status and demonstrates the ability to make significant improvements in function in a reasonable and predictable amount of time.     Equipment Recommendations   None recommended by OT       Precautions/Restrictions   Precautions Precautions: Fall     Mobility Bed Mobility Overal bed mobility: Needs Assistance Bed Mobility: Supine to Sit Rolling: Mod assist   Supine to sit: HOB elevated, Used rails          Transfers Overall transfer level: Needs assistance Equipment used: 2 person hand held assist Transfers: Sit to/from Stand, Bed to chair/wheelchair/BSC Sit to Stand: Mod assist, +2 physical assistance, +2 safety/equipment                  Balance Overall balance assessment: Needs assistance Sitting-balance support: Feet supported, Single extremity supported Sitting balance-Leahy Scale: Fair   Postural control: Posterior lean Standing balance support: Single extremity supported Standing balance-Leahy Scale: Poor                             ADL either performed or assessed with clinical judgement   ADL Overall ADL's : Needs assistance/impaired Eating/Feeding: Set up Eating/Feeding Details (indicate cue type and reason): one handed techniques Grooming: Wash/dry hands;Minimal assistance   Upper Body Bathing: Minimal assistance;Sitting   Lower Body Bathing: Sit to/from stand;Sitting/lateral leans;Cueing for sequencing;+2 for physical assistance;+2 for safety/equipment;Moderate assistance   Upper Body Dressing : Minimal assistance;Sitting Upper Body Dressing Details (indicate cue type and reason): using hemi techniques Lower Body Dressing: Sit to/from stand;Sitting/lateral leans;Moderate assistance;+2 for physical assistance;Cueing for safety;+2 for safety/equipment   Toilet Transfer: Moderate assistance;+2 for physical assistance;+2 for safety/equipment;Cueing for safety;Cueing for sequencing;Stand-pivot   Toileting- Clothing Manipulation and Hygiene: Maximal assistance;+2 for physical assistance;Sit to/from stand;Sitting/lateral lean;+2 for safety/equipment;Cueing for safety;Cueing for sequencing         General ADL Comments:  Spoke with pt  and daugther.  Wife is struggling to care for pt at this time.  Pt needing increased A with ADL activity in which wife cant provide.  Daugther feels strongly pt needs rehab and OT also agrees.  Pt at a high fall risk and needs to be stonger to return home.     Vision   Vision Assessment?: No apparent visual deficits     Perception         Praxis         Pertinent Vitals/Pain Pain Assessment Pain Assessment: No/denies pain     Extremity/Trunk Assessment Upper Extremity Assessment Upper Extremity Assessment: LUE deficits/detail;Right hand dominant LUE Deficits / Details: pt with old CVA.  LUE non functional. Pt reports he does perform ROM as well as the HHOT helps. LUE Coordination: decreased fine motor;decreased gross motor   Lower Extremity Assessment LLE Deficits / Details: baseline L hemiplegia, pt maintains L LE in knee extension and drop foot noted LLE Coordination: decreased fine motor;decreased gross motor       Communication Communication Communication: No apparent difficulties Factors Affecting Communication: Hearing impaired   Cognition Arousal: Alert Behavior During Therapy: WFL for tasks assessed/performed Cognition: History of cognitive impairments             OT - Cognition Comments: mild STM deficits as per chart                 Following commands: Intact       Cueing  General Comments   Cueing Techniques: Verbal cues              Home Living Family/patient expects to be discharged to:: Skilled nursing facility Living Arrangements: Spouse/significant other Available Help at Discharge: Family;Available 24 hours/day (wife is struggling to care for pt) Type of Home: House Home Access: Stairs to enter [employer] of Steps: 4 to porch then 1 step up into house Entrance Stairs-Rails: Can reach both Home Layout: One level     Bathroom Shower/Tub: Tub/shower unit;Curtain   Firefighter: Handicapped  height Bathroom Accessibility: Yes   Home Equipment: Shower seat;BSC/3in1;Cane - single point;Grab bars - toilet;Hand held shower head;Grab bars - tub/shower;Adaptive equipment;Wheelchair - Sport and exercise psychologist Comments: railings on bed      Prior Functioning/Environment Prior Level of Function : Needs assist   Mobility (Cognitive): Intermittent cues ADLs (Cognitive): Intermittent cues Physical Assist : Mobility (physical);ADLs (physical) Mobility (physical): Transfers;Gait;Stairs ADLs (physical): Bathing;IADLs Mobility Comments: wife utilizes gait belt as needed, pt amb with hemi walker in the home, active with HHPT, spouse reports doesn't go out much because stairs are difficult ADLs Comments: wife assists with bathing, active wtih HHOT and PT    OT Problem List: Decreased strength;Decreased range of motion;Decreased activity tolerance;Impaired balance (sitting and/or standing);Decreased coordination;Decreased safety awareness;Decreased knowledge of use of DME or AE;Impaired tone;Impaired UE functional use   OT Treatment/Interventions: Self-care/ADL training;Therapeutic exercise;Neuromuscular education;DME and/or AE instruction;Therapeutic activities;Patient/family education;Balance training      OT Goals(Current goals can be found in the care plan section)   Acute Rehab OT Goals Patient Stated Goal: pt agreeable to rehab.  he is aware wife if not able to care for him at this level OT Goal Formulation: With patient/family Time For Goal Achievement: 07/19/23 Potential to Achieve Goals: Good   OT Frequency:  Min 2X/week       AM-PAC OT "6 Clicks" Daily Activity     Outcome Measure Help from another person eating meals?: A Little Help from another person  taking care of personal grooming?: A Little Help from another person toileting, which includes using toliet, bedpan, or urinal?: Total Help from another person bathing (including washing, rinsing, drying)?: A  Lot Help from another person to put on and taking off regular upper body clothing?: A Lot Help from another person to put on and taking off regular lower body clothing?: Total 6 Click Score: 12   End of Session Nurse Communication: Mobility status;Precautions  Activity Tolerance: Patient tolerated treatment well Patient left: in chair;with family/visitor present;with call bell/phone within reach  OT Visit Diagnosis: Unsteadiness on feet (R26.81);Other abnormalities of gait and mobility (R26.89);Muscle weakness (generalized) (M62.81);Other symptoms and signs involving the nervous system (R29.898);Hemiplegia and hemiparesis Hemiplegia - Right/Left: Left Hemiplegia - dominant/non-dominant: Non-Dominant                Time: 4696-2952 OT Time Calculation (min): 26 min Charges:  OT General Charges $OT Visit: 1 Visit OT Evaluation $OT Eval Moderate Complexity: 1 Mod OT Treatments $Self Care/Home Management : 8-22 mins    Arienna Benegas, [place] 07/05/2023, 11:28 AM

## 2023-07-05 NOTE — Progress Notes (Signed)
 Mark [name] is now up on 3 E.  He has already received 1 dose of IVIG.  He has gotten a dose of Decadron.  He is got the Nplate.  His white cell count is 8.9.  Hemoglobin 11.  Platelet count 14,000.  I would not expect his platelet counts are coming up probably until Monday.  I really wanted to get him on Rituxan.  I think this would help him out and try to keep him in a remission.  He has had no fever.  He has had the epistaxis in the right nares.  Hopefully, this will slow down.  There is been no diarrhea.  He has had no nausea or vomiting.   His vital signs are temperature of 98.4.  Pulse 85.  Blood pressure 137/82.  His head and neck exam shows a packing in the right nares.  There is no intraoral lesions.  Lungs are clear bilaterally.  Cardiac exam regular rate and rhythm.  Abdomen is soft.  Bowel sounds are present.  There is no fluid wave.  There is no palpable liver or spleen tip.  Skin exam does show some ecchymosis and some petechia.  Neurological exam shows a flaccid left side secondary to past CVA.  He will get his second IVIG today.  He will get his second day of high-dose Decadron.  Again, I suspect that his blood count will start to improve on Monday.  Typically, when it comes up, it will come up quickly.  I know that he is happy to be back on 3 E.   Christin Bach, MD  Romans 12:10

## 2023-07-06 DIAGNOSIS — D693 Immune thrombocytopenic purpura: Secondary | ICD-10-CM | POA: Diagnosis not present

## 2023-07-06 LAB — COMPREHENSIVE METABOLIC PANEL
ALT: 40 U/L (ref 0–44)
AST: 37 U/L (ref 15–41)
Albumin: 3.2 g/dL — ABNORMAL LOW (ref 3.5–5.0)
Alkaline Phosphatase: 57 U/L (ref 38–126)
Anion gap: 6 (ref 5–15)
BUN: 40 mg/dL — ABNORMAL HIGH (ref 8–23)
CO2: 23 mmol/L (ref 22–32)
Calcium: 8.8 mg/dL — ABNORMAL LOW (ref 8.9–10.3)
Chloride: 103 mmol/L (ref 98–111)
Creatinine, Ser: 1.37 mg/dL — ABNORMAL HIGH (ref 0.61–1.24)
GFR, Estimated: 53 mL/min — ABNORMAL LOW (ref >60)
Glucose, Bld: 258 mg/dL — ABNORMAL HIGH (ref 70–99)
Potassium: 3.9 mmol/L (ref 3.5–5.1)
Sodium: 132 mmol/L — ABNORMAL LOW (ref 135–145)
Total Bilirubin: 0.3 mg/dL (ref 0.0–1.2)
Total Protein: 8.9 g/dL — ABNORMAL HIGH (ref 6.5–8.1)

## 2023-07-06 LAB — CBC WITH DIFFERENTIAL/PLATELET
Abs Immature Granulocytes: 0.17 10*3/uL — ABNORMAL HIGH (ref 0.00–0.07)
Basophils Absolute: 0 10*3/uL (ref 0.0–0.1)
Basophils Relative: 0 %
Eosinophils Absolute: 0 10*3/uL (ref 0.0–0.5)
Eosinophils Relative: 0 %
HCT: 33.9 % — ABNORMAL LOW (ref 39.0–52.0)
Hemoglobin: 10.6 g/dL — ABNORMAL LOW (ref 13.0–17.0)
Immature Granulocytes: 1 %
Lymphocytes Relative: 9 %
Lymphs Abs: 1.6 10*3/uL (ref 0.7–4.0)
MCH: 29.8 pg (ref 26.0–34.0)
MCHC: 31.3 g/dL (ref 30.0–36.0)
MCV: 95.2 fL (ref 80.0–100.0)
Monocytes Absolute: 0.8 10*3/uL (ref 0.1–1.0)
Monocytes Relative: 4 %
Neutro Abs: 15.1 10*3/uL — ABNORMAL HIGH (ref 1.7–7.7)
Neutrophils Relative %: 86 %
Platelets: 44 10*3/uL — ABNORMAL LOW (ref 150–400)
RBC: 3.56 MIL/uL — ABNORMAL LOW (ref 4.22–5.81)
RDW: 17.2 % — ABNORMAL HIGH (ref 11.5–15.5)
WBC: 17.7 10*3/uL — ABNORMAL HIGH (ref 4.0–10.5)
nRBC: 0.3 % — ABNORMAL HIGH (ref 0.0–0.2)

## 2023-07-06 LAB — GLUCOSE, CAPILLARY
Glucose-Capillary: 239 mg/dL — ABNORMAL HIGH (ref 70–99)
Glucose-Capillary: 204 mg/dL — ABNORMAL HIGH (ref 70–99)
Glucose-Capillary: 162 mg/dL — ABNORMAL HIGH (ref 70–99)
Glucose-Capillary: 200 mg/dL — ABNORMAL HIGH (ref 70–99)

## 2023-07-06 LAB — HEMOGLOBIN A1C
Hgb A1c MFr Bld: 6.2 % — ABNORMAL HIGH (ref 4.8–5.6)
Mean Plasma Glucose: 131.24 mg/dL

## 2023-07-06 MED ORDER — INSULIN ASPART 100 UNIT/ML IJ SOLN
0.0000 [IU] | Freq: Three times a day (TID) | INTRAMUSCULAR | Status: DC
  Administered 2023-07-06: 3 [IU] via SUBCUTANEOUS
  Administered 2023-07-06: 2 [IU] via SUBCUTANEOUS
  Administered 2023-07-06 – 2023-07-07 (×2): 3 [IU] via SUBCUTANEOUS
  Administered 2023-07-07 (×2): 2 [IU] via SUBCUTANEOUS
  Administered 2023-07-08: 3 [IU] via SUBCUTANEOUS
  Administered 2023-07-08 (×2): 2 [IU] via SUBCUTANEOUS
  Administered 2023-07-09 (×2): 1 [IU] via SUBCUTANEOUS

## 2023-07-06 MED ORDER — AMOXICILLIN-POT CLAVULANATE 875-125 MG PO TABS
1.0000 | ORAL_TABLET | Freq: Two times a day (BID) | ORAL | Status: AC
  Administered 2023-07-06 – 2023-07-08 (×5): 1 via ORAL
  Filled 2023-07-06 (×5): qty 1

## 2023-07-06 NOTE — Plan of Care (Signed)
  Problem: Education: Goal: Knowledge of General Education information will improve Description: Including pain rating scale, medication(s)/side effects and non-pharmacologic comfort measures Outcome: Progressing   Problem: Health Behavior/Discharge Planning: Goal: Ability to manage health-related needs will improve Outcome: Progressing   Problem: Coping: Goal: Level of anxiety will decrease Outcome: Progressing   Problem: Pain Managment: Goal: General experience of comfort will improve and/or be controlled Outcome: Progressing

## 2023-07-06 NOTE — Progress Notes (Signed)
 PROGRESS NOTE    Mark [name]  BJY:[id number] DOB: [birth year] DOA: 07/04/2023 PCP: Laurann Montana, MD   Brief Narrative: 78 year old with past medical history significant for ITP, lupus anticoagulant, coronary artery disease, hypertension, stroke with residual left-sided Hemi paresis, hyperlipidemia, CKD stage IIIa, GERD, history of seizure presented to the oncology clinic with severe thrombocytopenia.  Patient recently admitted to the hospital and treated with 4 days of high-dose Decadron and discharged home with improvement in platelet counts.  He saw Dr. Myna Hidalgo at the clinic and was found to have platelet counts again of 17, admitted for further treatment and evaluation.    Assessment & Plan:   Principal Problem:   Acute ITP (HCC) Active Problems:   Ischemic stroke (HCC)   Hemiparesis, left (HCC)   1-Acute ITP with relapse: -Present with recurrent thrombocytopenia -Dr. Myna Hidalgo following and recommended a dose of Nplate. -Decadron 40 mg IV daily for 3 days and IVIG over weekend.  -Oncology considering Rituxan.  Recurrent episode of epistaxis. Received one unit platelet 3/22. Platelet count increase to 44.  Epistaxis. In setting of thrombocytopenia.  Has some nasal  packing, will add Augmentin.  Leukocytosis could be form nplate.    2-History of ischemic stroke with left Hemiparesis, lupus anticoagulant positive, history of DVT: -Continue to hold Xarelto and aspirin due to high risk for bleeding in the setting of severe thrombocytopenia   Essential hypertension: Continue amlodipine  CKD 3A: Continue to monitor Cr baseline 1.3  History of seizure: Continue with Keppra twice daily  Anxiety, Depression: Continue bupropion and sertraline  Pre-Diabetics.  Hyperglycemia in setting steroids.  SSI.  Discussed diet modification with patient.      Estimated body mass index is 25.89 kg/m as calculated from the following:   Height as of this encounter: 6' (1.829 m).    Weight as of this encounter: 86.6 kg.   DVT prophylaxis: SCD Code Status: DNR Family Communication: daughter and sister at bedside.  Disposition Plan:  Status is: Inpatient Remains inpatient appropriate because: management of ITP    Consultants:  Oncology   Procedures:    Antimicrobials:    Subjective: He is doing ok, has nose packing, gauze, placed yesterday for nose bleeding.  No bleeding today.  We talk about diet recommendation due to hyperglycemia.   Objective: Vitals:   07/06/23 0304 07/06/23 0427 07/06/23 0637 07/06/23 0734  BP: (!) 146/87 129/86 (!) 143/84 (!) 140/78  Pulse: 90 85 79 79  Resp: 18 15 15 16   Temp: 98.2 F (36.8 C) 98.4 F (36.9 C) 98.6 F (37 C) 97.8 F (36.6 C)  TempSrc: Oral Oral Oral Oral  SpO2: 94% 95% 95% 95%  Weight:      Height:        Intake/Output Summary (Last 24 hours) at 07/06/2023 1343 Last data filed at 07/06/2023 0900 Gross per 24 hour  Intake 1943.5 ml  Output 2600 ml  Net -656.5 ml   Filed Weights   07/04/23 1517 07/06/23 0100  Weight: 79.4 kg 86.6 kg    Examination:  General exam: NAD Respiratory system: CTA Cardiovascular system: S 1, S 2 RRR Gastrointestinal system: BS present, soft, nt  Central nervous system: Alert and oriented. Left Upper extremity hemiparesis.  Extremities:no edema    Data Reviewed: I have personally reviewed following labs and imaging studies  CBC: Recent Labs  Lab 07/04/23 1327 07/05/23 0529 07/06/23 0515  WBC 10.6* 8.9 17.7*  NEUTROABS 5.3  --  15.1*  HGB 12.9* 11.0* 10.6*  HCT 39.7 33.6* 33.9*  MCV 92.3 93.9 95.2  PLT 17* 14* 44*   Basic Metabolic Panel: Recent Labs  Lab 07/04/23 1334 07/06/23 0515  NA 140 132*  K 4.7 3.9  CL 105 103  CO2 27 23  GLUCOSE 106* 258*  BUN 24* 40*  CREATININE 1.30* 1.37*  CALCIUM 9.6 8.8*   GFR: Estimated Creatinine Clearance: 48.8 mL/min (A) (by C-G formula based on SCr of 1.37 mg/dL (H)). Liver Function Tests: Recent Labs   Lab 07/04/23 1334 07/06/23 0515  AST 35 37  ALT 32 40  ALKPHOS 70 57  BILITOT 0.4 0.3  PROT 7.8 8.9*  ALBUMIN 4.3 3.2*   No results for input(s): "LIPASE", "AMYLASE" in the last 168 hours. No results for input(s): "AMMONIA" in the last 168 hours. Coagulation Profile: No results for input(s): "INR", "PROTIME" in the last 168 hours. Cardiac Enzymes: No results for input(s): "CKTOTAL", "CKMB", "CKMBINDEX", "TROPONINI" in the last 168 hours. BNP (last 3 results) No results for input(s): "PROBNP" in the last 8760 hours. HbA1C: Recent Labs    07/06/23 0849  HGBA1C 6.2*   CBG: Recent Labs  Lab 07/06/23 0902 07/06/23 1208  GLUCAP 239* 204*   Lipid Profile: No results for input(s): "CHOL", "HDL", "LDLCALC", "TRIG", "CHOLHDL", "LDLDIRECT" in the last 72 hours. Thyroid Function Tests: No results for input(s): "TSH", "T4TOTAL", "FREET4", "T3FREE", "THYROIDAB" in the last 72 hours. Anemia Panel: No results for input(s): "VITAMINB12", "FOLATE", "FERRITIN", "TIBC", "IRON", "RETICCTPCT" in the last 72 hours. Sepsis Labs: No results for input(s): "PROCALCITON", "LATICACIDVEN" in the last 168 hours.  No results found for this or any previous visit (from the past 240 hours).       Radiology Studies: No results found.      Scheduled Meds:  acetaminophen  500 mg Oral QHS   amLODipine  10 mg Oral QHS   amoxicillin-clavulanate  1 tablet Oral Q12H   baclofen  10 mg Oral BID   buPROPion  150 mg Oral q AM   cholecalciferol  2,000 Units Oral QHS   gabapentin  100 mg Oral QHS   HYDROcodone-acetaminophen  1 tablet Oral QHS   insulin aspart  0-9 Units Subcutaneous TID WC   levETIRAcetam  1,500 mg Oral BID   nystatin  5 mL Oral QID   pantoprazole  40 mg Oral Daily   rosuvastatin  40 mg Oral QHS   sertraline  200 mg Oral q AM   Continuous Infusions:  dexamethasone (DECADRON) IVPB ([facility]) 40 mg (07/05/23 1857)     LOS: 2 days    Time spent: 35 minutes    Xzayvion Vaeth A  Howie Rufus, MD [facility]   If 7PM-7AM, please contact night-coverage [email or web address]  07/06/2023, 1:43 PM

## 2023-07-06 NOTE — TOC Initial Note (Signed)
 Transition of Care [facility]) - Initial/Assessment Note    Patient Details  Name: Mark [name] MRN: [id number] Date of Birth: 12-Aug-1945  [facility] CM/SW Contact:    Mark Prows, RN Phone Number: 07/06/2023, 1:56 PM  Clinical Narrative:                 Mark [name] w/ pt in rm; pt says he lives at home w/ his wife Mark [name] [phone]); his wife will provide transportation; pt verified insurance/PCP; he denies SDOH risks; pt says he has walker, wheelchair, BSC, shower chair; pt says he has HHPT/OT w/ [facility]; he does not have home oxygen; awaiting PT/OT evals; TOC is following.  Expected Discharge Plan: Home w Home Health Services Barriers to Discharge: Continued Medical Work up   Patient Goals and CMS Choice Patient states their goals for this hospitalization and ongoing recovery are:: home CMS Medicare.gov Compare Post Acute Care list provided to:: Patient   [place] ownership interest in [facility].provided to:: Patient    Expected Discharge Plan and Services   Discharge Planning Services: CM Consult   Living arrangements for the past 2 months: Single Family Home                                      Prior Living Arrangements/Services Living arrangements for the past 2 months: Single Family Home Lives with:: Spouse Patient language and need for interpreter reviewed:: Yes Do you feel safe going back to the place where you live?: Yes      Need for Family Participation in Patient Care: Yes (Comment) Care giver support system in place?: Yes (comment) Current home services: Home OT, Home PT, DME (walker, wheelchair, BSC, shower chair; HHPT/OT w/ [facility]) Criminal Activity/Legal Involvement Pertinent to Current Situation/Hospitalization: No - Comment as needed  Activities of Daily Living   ADL Screening (condition at time of admission) Independently performs ADLs?: No Does the patient have a NEW difficulty with  bathing/dressing/toileting/self-feeding that is expected to last >3 days?: No Does the patient have a NEW difficulty with getting in/out of bed, walking, or climbing stairs that is expected to last >3 days?: No Does the patient have a NEW difficulty with communication that is expected to last >3 days?: No Is the patient deaf or have difficulty hearing?: Yes Does the patient have difficulty seeing, even when wearing glasses/contacts?: No Does the patient have difficulty concentrating, remembering, or making decisions?: Yes  Permission Sought/Granted Permission sought to share information with : Case Manager Permission granted to share information with : Yes, Verbal Permission Granted  Share Information with NAME: Case Manager     Permission granted to share info w Relationship: Mark [name] (spouse) [phone]     Emotional Assessment Appearance:: Appears stated age Attitude/Demeanor/Rapport: Gracious Affect (typically observed): Accepting Orientation: : Oriented to Self, Oriented to Place, Oriented to  Time, Oriented to Situation Alcohol / Substance Use: Not Applicable Psych Involvement: No (comment)  Admission diagnosis:  Thrombocytopenia (HCC) [D69.6] Idiopathic thrombocytopenic purpura (HCC) [D69.3] Idiopathic thrombocytopenic purpura (ITP) (HCC) [D69.3] Acute ITP (HCC) [D69.3] Patient Active Problem List   Diagnosis Date Noted   Acute ITP (HCC) 07/04/2023   Thrombocytopenia (HCC) 06/10/2023   Idiopathic thrombocytopenic purpura (ITP) (HCC) 06/09/2023   Acute lower UTI 07/04/2020   CKD (chronic kidney disease), stage III (HCC) 07/01/2020   Neurogenic bladder 07/01/2020   Pulmonary nodule 10/24/2016   Acute hypoxemic  respiratory failure (HCC) 10/21/2016   Sepsis (HCC) 10/21/2016   DVT (deep venous thrombosis) (HCC) 10/12/2016   Supratherapeutic INR 10/12/2016   Acute deep vein thrombosis (DVT) of popliteal vein of right lower extremity (HCC) 10/12/2016   AKI (acute  kidney injury) (HCC) 10/12/2016   Fever 10/12/2016   Leukocytosis 10/12/2016   Bacteriuria 10/12/2016   Deep vein thrombosis (DVT) (HCC) 03/02/2015   Lupus anticoagulant disorder (HCC) 03/02/2015   Ischemic stroke (HCC) 10/04/2014   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   Deep vein thrombosis (HCC) 10/04/2014   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   LA (lupus anticoagulant) disorder (HCC) 10/04/2014   Arteriosclerosis of coronary artery 09/10/2012   Apnea, sleep 09/10/2012   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 [place] [address] Phone: [phone] Fax: [phone]     Social Drivers of Health (SDOH) Social History: SDOH Screenings   Food Insecurity: No Food Insecurity (07/06/2023)  Housing: Low Risk  (07/06/2023)  Transportation Needs: No Transportation Needs (07/06/2023)  Utilities: Not At Risk (07/06/2023)  Social Connections: Moderately Integrated (07/04/2023)  Tobacco Use: Medium Risk (07/04/2023)   SDOH Interventions: Food Insecurity Interventions: Intervention Not Indicated, Inpatient TOC Housing Interventions: Intervention Not Indicated, Inpatient TOC Transportation Interventions: Intervention Not Indicated, Inpatient TOC Utilities Interventions: Intervention Not Indicated, Inpatient TOC   Readmission Risk Interventions    07/06/2023    1:53 PM  Readmission Risk Prevention Plan  Transportation Screening Complete  PCP or Specialist Appt within 5-7 Days Complete  Home Care Screening Complete  Medication Review (RN CM) Complete

## 2023-07-06 NOTE — Evaluation (Signed)
 Physical Therapy Evaluation Patient Details Name: Mark [name] MRN: [id number] DOB: [birth year] Today's Date: 07/06/2023  History of Present Illness  Mark [name] is a 78 y.o. male presented from oncology clinic with severe thrombocytopenia.  Patient was recently admitted to the hospital and treated with 4 days of high-dose Decadron and discharged home with improvement of platelet counts.  He went to follow-up with Dr. Myna Hidalgo and found to have platelets of 17 with petechiae and intermittent epistaxis at home so was sent to the ER. Past medical history significant of ITP, lupus anticoagulant, coronary artery disease, hypertension, stroke with residual left sided hemiaplasia, hyperlipidemia, CKD stage IIIa, GERD and seizure  Clinical Impression  Pt admitted with above diagnosis.  Pt currently with functional limitations due to the deficits listed below (see PT Problem List). Pt will benefit from acute skilled PT to increase their independence and safety with mobility to allow discharge.  Pt typically able to ambulate with hemiwalker at baseline and was getting into community, however since his admission a few weeks ago, pt's mobility has declined.  Pt has not been able to ambulate or perform steps without increased physical assist.  Spouse and daughter have been attempting to assist however feel he is not safe to return home at current level as they are unable to continue to provide this.  Family is very supportive, and pt is extremely motivated and pleasant.  Patient will benefit from intensive inpatient follow-up therapy, >3 hours/day.          If plan is discharge home, recommend the following: Two people to help with walking and/or transfers;A lot of help with bathing/dressing/bathroom;Assistance with cooking/housework;Assist for transportation;Help with stairs or ramp for entrance   Can travel by private vehicle        Equipment Recommendations None recommended by PT  Recommendations  for Other Services       Functional Status Assessment Patient has had a recent decline in their functional status and demonstrates the ability to make significant improvements in function in a reasonable and predictable amount of time.     Precautions / Restrictions Precautions Precautions: Fall Precaution/Restrictions Comments: Left hemiplegia      Mobility  Bed Mobility Overal bed mobility: Needs Assistance Bed Mobility: Supine to Sit     Supine to sit: HOB elevated, Used rails, Supervision     General bed mobility comments: reliant on R extremities and hand rail    Transfers Overall transfer level: Needs assistance Equipment used: Hemi-walker Transfers: Sit to/from Stand Sit to Stand: Mod assist, +2 physical assistance, +2 safety/equipment           General transfer comment: assist to rise and stabilize, increased right lateral-posterior lean (daughter reports this had worsened just prior to admission); able to correct with cues    Ambulation/Gait Ambulation/Gait assistance: Mod assist, +2 physical assistance Gait Distance (Feet): 22 Feet Assistive device: Hemi-walker Gait Pattern/deviations: Step-to pattern, Decreased weight shift to left, Decreased dorsiflexion - left Gait velocity: decreased     General Gait Details: significant lean of trunk to right posterior (weight over hemiwalker) and likely to compensate to advance L LE; mod assist due to weakness and balance; daughter followed with recliner; +2 with pt for safety  Stairs            Wheelchair Mobility     Tilt Bed    Modified Rankin (Stroke Patients Only)       Balance Overall balance assessment: Needs assistance Sitting-balance support: Feet supported, No upper  extremity supported Sitting balance-Leahy Scale: Fair   Postural control: Posterior lean, Right lateral lean Standing balance support: Single extremity supported, Reliant on assistive device for balance Standing balance-Leahy  Scale: Zero                               Pertinent Vitals/Pain Pain Assessment Pain Assessment: No/denies pain    Home Living Family/patient expects to be discharged to:: Skilled nursing facility Living Arrangements: Spouse/significant other Available Help at Discharge: Family;Available 24 hours/day (wife is struggling to care for pt) Type of Home: House Home Access: Stairs to enter Entrance Stairs-Rails: Can reach both Entrance Stairs-Number of Steps: 4 to porch then 1 step up into house   Home Layout: One level Home Equipment: Shower seat;BSC/3in1;Cane - single point;Grab bars - toilet;Hand held shower head;Grab bars - tub/shower;Adaptive equipment;Wheelchair - Fish farm manager Comments: railings on bed    Prior Function Prior Level of Function : Needs assist   Mobility (Cognitive): Intermittent cues ADLs (Cognitive): Intermittent cues Physical Assist : Mobility (physical);ADLs (physical) Mobility (physical): Transfers;Gait;Stairs ADLs (physical): Bathing;IADLs Mobility Comments: wife utilizes gait belt as needed, pt amb with hemi walker in the home, active with HHPT, spouse reports doesn't go out much because stairs are difficult ADLs Comments: wife assists with bathing, active wtih HHOT and PT     Extremity/Trunk Assessment   Upper Extremity Assessment Upper Extremity Assessment: Right hand dominant;LUE deficits/detail LUE Deficits / Details: pt with hx CVA.  L UE non functional, increased tone. Pt reports he does perform ROM as well as the HHOT helps. LUE Coordination: decreased fine motor;decreased gross motor    Lower Extremity Assessment Lower Extremity Assessment: LLE deficits/detail;Generalized weakness LLE Deficits / Details: baseline L hemiplegia, pt maintains L LE in knee extension and drop foot noted LLE Coordination: decreased fine motor;decreased gross motor    Cervical / Trunk Assessment Cervical / Trunk Assessment:  Kyphotic  Communication   Communication Communication: Impaired Factors Affecting Communication: Hearing impaired    Cognition Arousal: Alert Behavior During Therapy: WFL for tasks assessed/performed   PT - Cognitive impairments: No apparent impairments                         Following commands: Intact       Cueing Cueing Techniques: Verbal cues     General Comments      Exercises     Assessment/Plan    PT Assessment Patient needs continued PT services  PT Problem List Decreased strength;Decreased range of motion;Decreased activity tolerance;Decreased balance;Decreased mobility;Decreased coordination;Impaired tone       PT Treatment Interventions DME instruction;Gait training;Stair training;Functional mobility training;Therapeutic activities;Therapeutic exercise;Balance training;Neuromuscular re-education;Patient/family education;Wheelchair mobility training    PT Goals (Current goals can be found in the Care Plan section)  Acute Rehab PT Goals PT Goal Formulation: With patient/family Time For Goal Achievement: 07/20/23 Potential to Achieve Goals: Good    Frequency Min 2X/week     Co-evaluation               AM-PAC PT "6 Clicks" Mobility  Outcome Measure Help needed turning from your back to your side while in a flat bed without using bedrails?: A Lot Help needed moving from lying on your back to sitting on the side of a flat bed without using bedrails?: A Lot Help needed moving to and from a bed to a chair (including a wheelchair)?: Total Help needed standing up  from a chair using your arms (e.g., wheelchair or bedside chair)?: Total Help needed to walk in hospital room?: Total Help needed climbing 3-5 steps with a railing? : Total 6 Click Score: 8    End of Session Equipment Utilized During Treatment: Gait belt Activity Tolerance: Patient tolerated treatment well;Patient limited by fatigue Patient left: in chair;with chair alarm set;with  call bell/phone within reach;with family/visitor present   PT Visit Diagnosis: Unsteadiness on feet (R26.81);Muscle weakness (generalized) (M62.81);Other abnormalities of gait and mobility (R26.89)    Time: 1610-9604 PT Time Calculation (min) (ACUTE ONLY): 22 min   Charges:   PT Evaluation $PT Eval Moderate Complexity: 1 Mod   PT General Charges $$ ACUTE PT VISIT: 1 Visit        Thomasene Mohair PT, DPT Physical Therapist Acute Rehabilitation Services Office: [phone]   Janan Halter [place] 07/06/2023, 2:51 PM

## 2023-07-07 DIAGNOSIS — D693 Immune thrombocytopenic purpura: Secondary | ICD-10-CM | POA: Diagnosis not present

## 2023-07-07 LAB — BPAM PLATELET PHERESIS
Blood Product Expiration Date: 202503232359
ISSUE DATE / TIME: 202503221503
Unit Type and Rh: 6200

## 2023-07-07 LAB — GLUCOSE, CAPILLARY
Glucose-Capillary: 174 mg/dL — ABNORMAL HIGH (ref 70–99)
Glucose-Capillary: 230 mg/dL — ABNORMAL HIGH (ref 70–99)
Glucose-Capillary: 162 mg/dL — ABNORMAL HIGH (ref 70–99)
Glucose-Capillary: 246 mg/dL — ABNORMAL HIGH (ref 70–99)

## 2023-07-07 LAB — COMPREHENSIVE METABOLIC PANEL
ALT: 38 U/L (ref 0–44)
AST: 32 U/L (ref 15–41)
Albumin: 3.1 g/dL — ABNORMAL LOW (ref 3.5–5.0)
Alkaline Phosphatase: 52 U/L (ref 38–126)
Anion gap: 8 (ref 5–15)
BUN: 50 mg/dL — ABNORMAL HIGH (ref 8–23)
CO2: 20 mmol/L — ABNORMAL LOW (ref 22–32)
Calcium: 8.6 mg/dL — ABNORMAL LOW (ref 8.9–10.3)
Chloride: 105 mmol/L (ref 98–111)
Creatinine, Ser: 1.23 mg/dL (ref 0.61–1.24)
GFR, Estimated: >60 mL/min (ref >60)
Glucose, Bld: 215 mg/dL — ABNORMAL HIGH (ref 70–99)
Potassium: 3.8 mmol/L (ref 3.5–5.1)
Sodium: 133 mmol/L — ABNORMAL LOW (ref 135–145)
Total Bilirubin: 0.5 mg/dL (ref 0.0–1.2)
Total Protein: 9.4 g/dL — ABNORMAL HIGH (ref 6.5–8.1)

## 2023-07-07 LAB — CBC WITH DIFFERENTIAL/PLATELET
Abs Immature Granulocytes: 0.35 10*3/uL — ABNORMAL HIGH (ref 0.00–0.07)
Basophils Absolute: 0 10*3/uL (ref 0.0–0.1)
Basophils Relative: 0 %
Eosinophils Absolute: 0 10*3/uL (ref 0.0–0.5)
Eosinophils Relative: 0 %
HCT: 34.9 % — ABNORMAL LOW (ref 39.0–52.0)
Hemoglobin: 10.8 g/dL — ABNORMAL LOW (ref 13.0–17.0)
Immature Granulocytes: 2 %
Lymphocytes Relative: 7 %
Lymphs Abs: 1.4 10*3/uL (ref 0.7–4.0)
MCH: 29.8 pg (ref 26.0–34.0)
MCHC: 30.9 g/dL (ref 30.0–36.0)
MCV: 96.1 fL (ref 80.0–100.0)
Monocytes Absolute: 0.9 10*3/uL (ref 0.1–1.0)
Monocytes Relative: 4 %
Neutro Abs: 16.5 10*3/uL — ABNORMAL HIGH (ref 1.7–7.7)
Neutrophils Relative %: 87 %
Platelets: 86 10*3/uL — ABNORMAL LOW (ref 150–400)
RBC: 3.63 MIL/uL — ABNORMAL LOW (ref 4.22–5.81)
RDW: 17.4 % — ABNORMAL HIGH (ref 11.5–15.5)
WBC: 19.1 10*3/uL — ABNORMAL HIGH (ref 4.0–10.5)
nRBC: 0.2 % (ref 0.0–0.2)

## 2023-07-07 LAB — PREPARE PLATELET PHERESIS: Unit division: 0

## 2023-07-07 NOTE — TOC Progression Note (Signed)
 Transition of Care [facility]) - Progression Note    Patient Details  Name: Mark [name] MRN: [id number] Date of Birth: 02-01-1946  Transition of Care [facility]) CM/SW Contact  Howell Rucks, RN Phone Number: 07/07/2023, 9:45 AM  Clinical Narrative:  PT eval completed, recommendation for Acute Inpatient Rehab, screened by CIR, potential rehab candidate, full consult pending. TOC will continue to follow.      Expected Discharge Plan: Home w Home Health Services Barriers to Discharge: Continued Medical Work up  Expected Discharge Plan and Services   Discharge Planning Services: CM Consult   Living arrangements for the past 2 months: Single Family Home                                       Social Determinants of Health (SDOH) Interventions SDOH Screenings   Food Insecurity: No Food Insecurity (07/06/2023)  Housing: Low Risk  (07/06/2023)  Transportation Needs: No Transportation Needs (07/06/2023)  Utilities: Not At Risk (07/06/2023)  Social Connections: Moderately Integrated (07/04/2023)  Tobacco Use: Medium Risk (07/04/2023)    Readmission Risk Interventions    07/06/2023    1:53 PM  Readmission Risk Prevention Plan  Transportation Screening Complete  PCP or Specialist Appt within 5-7 Days Complete  Home Care Screening Complete  Medication Review (RN CM) Complete

## 2023-07-07 NOTE — Plan of Care (Signed)
   Problem: Education: Goal: Knowledge of General Education information will improve Description Including pain rating scale, medication(s)/side effects and non-pharmacologic comfort measures Outcome: Progressing   Problem: Health Behavior/Discharge Planning: Goal: Ability to manage health-related needs will improve Outcome: Progressing

## 2023-07-07 NOTE — Progress Notes (Signed)

## 2023-07-07 NOTE — Progress Notes (Signed)
 PROGRESS NOTE    Mark [name]  ZOX:[id number] DOB: 03/08/1946 DOA: 07/04/2023 PCP: Mark Montana, MD   Brief Narrative: 78 year old with past medical history significant for ITP, lupus anticoagulant, coronary artery disease, hypertension, stroke with residual left-sided Hemi paresis, hyperlipidemia, CKD stage IIIa, GERD, history of seizure presented to the oncology clinic with severe thrombocytopenia.  Patient recently admitted to the hospital and treated with 4 days of high-dose Decadron and discharged home with improvement in platelet counts.  He saw Dr. Myna [name] at the clinic and was found to have platelet counts again of 17, admitted for further treatment and evaluation.    Assessment & Plan:   Principal Problem:   Acute ITP (HCC) Active Problems:   Ischemic stroke (HCC)   Hemiparesis, left (HCC)   1-Acute ITP with relapse: -Present with recurrent thrombocytopenia -Dr. Myna [name] following and recommended a dose of Nplate. -Decadron 40 mg IV daily for 3 days and IVIG over weekend.  -Oncology considering Rituxan out patient. .  Recurrent episode of epistaxis. Received one unit platelet 3/22. Platelet count increase to 88  Epistaxis. In setting of thrombocytopenia.  Has some nasal  packing, will added Augmentin.  Leukocytosis could be form nplate.  Resolved  2-History of ischemic stroke with left Hemiparesis, lupus anticoagulant positive, history of DVT: -Continue to hold Xarelto and aspirin due to high risk for bleeding in the setting of severe thrombocytopenia Plan to resume  Xarelto when platelet count more than 100,000  Essential hypertension: Continue amlodipine  CKD 3A: Continue to monitor Cr baseline 1.3  History of seizure: Continue with Keppra twice daily  Anxiety, Depression: Continue bupropion and sertraline  Pre-Diabetics.  Hyperglycemia in setting steroids.  SSI.  Discussed diet modification with patient.   GUN; patient does intermittent  catheterization at home   Estimated body mass index is 25.89 kg/m as calculated from the following:   Height as of this encounter: 6' (1.829 m).   Weight as of this encounter: 86.6 kg.   DVT prophylaxis: SCD Code Status: DNR Family Communication: daughter and sister at bedside.  Disposition Plan:  Status is: Inpatient Remains inpatient appropriate because: management of ITP    Consultants:  Oncology   Procedures:    Antimicrobials:    Subjective: Doing well, no new complaint.  Objective: Vitals:   07/06/23 0734 07/06/23 2051 07/07/23 0539 07/07/23 1414  BP: (!) 140/78 (!) 145/80 (!) 137/93 (!) 145/87  Pulse: 79 78 67 68  Resp: 16 15 15    Temp: 97.8 F (36.6 C) 98.6 F (37 C) 98 F (36.7 C) 97.7 F (36.5 C)  TempSrc: Oral Oral Oral Oral  SpO2: 95% 96% 96% 95%  Weight:      Height:        Intake/Output Summary (Last 24 hours) at 07/07/2023 1533 Last data filed at 07/07/2023 1350 Gross per 24 hour  Intake 644 ml  Output 2900 ml  Net -2256 ml   Filed Weights   07/04/23 1517 07/06/23 0100  Weight: 79.4 kg 86.6 kg    Examination:  General exam: NAD Respiratory system: CTA Cardiovascular system: S 1, S 2  RRR Gastrointestinal system: BS present, soft, nt Central nervous system: Alert and oriented. Left Upper extremity hemiparesis.  Extremities:no edema    Data Reviewed: I have personally reviewed following labs and imaging studies  CBC: Recent Labs  Lab 07/04/23 1327 07/05/23 0529 07/06/23 0515 07/07/23 0427  WBC 10.6* 8.9 17.7* 19.1*  NEUTROABS 5.3  --  15.1* 16.5*  HGB 12.9* 11.0*  10.6* 10.8*  HCT 39.7 33.6* 33.9* 34.9*  MCV 92.3 93.9 95.2 96.1  PLT 17* 14* 44* 86*   Basic Metabolic Panel: Recent Labs  Lab 07/04/23 1334 07/06/23 0515 07/07/23 0427  NA 140 132* 133*  K 4.7 3.9 3.8  CL 105 103 105  CO2 27 23 20*  GLUCOSE 106* 258* 215*  BUN 24* 40* 50*  CREATININE 1.30* 1.37* 1.23  CALCIUM 9.6 8.8* 8.6*   GFR: Estimated  Creatinine Clearance: 54.3 mL/min (by C-G formula based on SCr of 1.23 mg/dL). Liver Function Tests: Recent Labs  Lab 07/04/23 1334 07/06/23 0515 07/07/23 0427  AST 35 37 32  ALT 32 40 38  ALKPHOS 70 57 52  BILITOT 0.4 0.3 0.5  PROT 7.8 8.9* 9.4*  ALBUMIN 4.3 3.2* 3.1*   No results for input(s): "LIPASE", "AMYLASE" in the last 168 hours. No results for input(s): "AMMONIA" in the last 168 hours. Coagulation Profile: No results for input(s): "INR", "PROTIME" in the last 168 hours. Cardiac Enzymes: No results for input(s): "CKTOTAL", "CKMB", "CKMBINDEX", "TROPONINI" in the last 168 hours. BNP (last 3 results) No results for input(s): "PROBNP" in the last 8760 hours. HbA1C: Recent Labs    07/06/23 0849  HGBA1C 6.2*   CBG: Recent Labs  Lab 07/06/23 1208 07/06/23 1621 07/06/23 2048 07/07/23 0705 07/07/23 1125  GLUCAP 204* 162* 200* 174* 230*   Lipid Profile: No results for input(s): "CHOL", "HDL", "LDLCALC", "TRIG", "CHOLHDL", "LDLDIRECT" in the last 72 hours. Thyroid Function Tests: No results for input(s): "TSH", "T4TOTAL", "FREET4", "T3FREE", "THYROIDAB" in the last 72 hours. Anemia Panel: No results for input(s): "VITAMINB12", "FOLATE", "FERRITIN", "TIBC", "IRON", "RETICCTPCT" in the last 72 hours. Sepsis Labs: No results for input(s): "PROCALCITON", "LATICACIDVEN" in the last 168 hours.  No results found for this or any previous visit (from the past 240 hours).       Radiology Studies: No results found.      Scheduled Meds:  acetaminophen  500 mg Oral QHS   amLODipine  10 mg Oral QHS   amoxicillin-clavulanate  1 tablet Oral Q12H   baclofen  10 mg Oral BID   buPROPion  150 mg Oral q AM   cholecalciferol  2,000 Units Oral QHS   gabapentin  100 mg Oral QHS   HYDROcodone-acetaminophen  1 tablet Oral QHS   insulin aspart  0-9 Units Subcutaneous TID WC   levETIRAcetam  1,500 mg Oral BID   nystatin  5 mL Oral QID   pantoprazole  40 mg Oral Daily    rosuvastatin  40 mg Oral QHS   sertraline  200 mg Oral q AM   Continuous Infusions:  dexamethasone (DECADRON) IVPB ([facility]) 40 mg (07/06/23 1808)     LOS: 3 days    Time spent: 35 minutes    Mark Aloisi A [name], MD [facility]   If 7PM-7AM, please contact night-coverage [email or web address]  07/07/2023, 3:33 PM

## 2023-07-07 NOTE — Progress Notes (Signed)
 As expected, the platelet counts come up nicely.  Today is 86,000.  There is no bleeding.  We are going to have to try Rituxan at some point.  I think this is going to be helpful to try keeping to a longer term remission.  The nosebleed out of the right nares has resolved.  He also was hypercoagulable once his platelet count gets up over 100,000.  At that point, we will have to see about getting back on Xarelto and baby aspirin.  Appetite is doing quite well.  He has had no nausea or vomiting.  There is been no problems with bowels or bladder.  He has had no issues with cough or shortness of breath.  His chemical studies show sodium 133.  Potassium 3.8.  BUN 50 creatinine 1.23.  Blood sugar 215.  His albumin is 3.1.  The white cell count is 19.1.  Hemoglobin 10.8.  Platelet count 86,000.  His vital signs show temperature 98.  Pulse 67.  Blood pressure 137/93.  His head and neck exam shows no ocular or oral lesions.  There is no petechia or hematomas in the oral cavity.  Lungs are clear bilaterally.  Cardiac exam regular rate and rhythm.  Abdomen soft.  Bowel sounds are present.  Has no fluid wave.  No palpable liver or spleen tip.  Extremities shows the flaccid left side.  This is chronic.  Right side is unremarkable.  We will see how his platelet count goes tomorrow.  I think we will probably have to do Rituxan as an outpatient.  It probably is a lot easier to do it as an outpatient.  Hopefully, he will be able to go to rehab.  I know his wife would like him to go to [facility] outpatient rehab facility.  I am unsure what 1 exists.  I know there is the inpatient rehab facility.  I do appreciate everybody's help overall 3 E.   Christin Bach, MD  Romans 1:16

## 2023-07-07 NOTE — Progress Notes (Signed)
 Mobility Specialist - Progress Note   07/07/23 1340  Mobility  Activity Transferred from chair to bed  Level of Assistance Moderate assist, patient does 50-74%  Assistive Device Other (Comment) (HHA)  Range of Motion/Exercises Active  Activity Response Tolerated well  Mobility Referral Yes  Mobility visit 1 Mobility  Mobility Specialist Start Time (ACUTE ONLY) 1330  Mobility Specialist Stop Time (ACUTE ONLY) 1340  Mobility Specialist Time Calculation (min) (ACUTE ONLY) 10 min   RN requested assistance putting pt back to bed. Was left with RN and student-RN in room.   Billey Chang Mobility Specialist

## 2023-07-07 NOTE — Plan of Care (Signed)
  Problem: Education: Goal: Knowledge of General Education information will improve Description: Including pain rating scale, medication(s)/side effects and non-pharmacologic comfort measures 07/07/2023 0719 by Hilliard Clark, RN Outcome: Progressing 07/07/2023 0710 by Hilliard Clark, RN Outcome: Progressing 07/07/2023 0710 by Hilliard Clark, RN Outcome: Progressing 07/07/2023 0710 by Hilliard Clark, RN Outcome: Progressing   Problem: Health Behavior/Discharge Planning: Goal: Ability to manage health-related needs will improve 07/07/2023 0719 by Hilliard Clark, RN Outcome: Progressing 07/07/2023 0710 by Hilliard Clark, RN Outcome: Progressing 07/07/2023 0710 by Hilliard Clark, RN Outcome: Progressing 07/07/2023 0710 by Hilliard Clark, RN Outcome: Progressing

## 2023-07-07 NOTE — Progress Notes (Signed)
 Inpatient Rehab Admissions:  Inpatient Rehab Consult received.  Nursing informed AC that pt requesting [facility] call wife regarding CIR. I spoke with pt's wife Mark [name] on the telephone for rehabilitation assessment and to discuss goals and expectations of an inpatient rehab admission.  Discussed average length of stay, insurance authorization requirement and discharge home after completion of CIR. Mark [name] acknowledged understanding. She would like pt to pursue CIR. She confirmed that she will be able to provide 24/7 support for pt after discharge. Will continue to follow.   Signed: Wolfgang Phoenix, MS, CCC-SLP Admissions Coordinator [phone]

## 2023-07-07 NOTE — Progress Notes (Signed)
 Mobility Specialist - Progress Note   07/07/23 0918  Mobility  Activity Transferred from bed to chair  Level of Assistance Moderate assist, patient does 50-74%  Assistive Device Other (Comment) (HHA)  Range of Motion/Exercises Active  Activity Response Tolerated well  Mobility Referral Yes  Mobility visit 1 Mobility  Mobility Specialist Start Time (ACUTE ONLY) 0908  Mobility Specialist Stop Time (ACUTE ONLY) [id number]  Mobility Specialist Time Calculation (min) (ACUTE ONLY) 10 min   Pt was found in bed and agreeable to transfer to recliner chair. No complaints with session. At EOS was left with all needs met. Call bell in reach and family in room.  Billey Chang Mobility Specialist

## 2023-07-07 NOTE — PMR Pre-admission (Signed)
 PMR Admission Coordinator Pre-Admission Assessment  Patient: Mark [name] is an 78 y.o., male MRN: [id number] DOB: 08-Aug-1945 Height: 6' (182.9 cm) Weight: 86.6 kg  Insurance Information HMO: ***    PPO: ***     PCP:      IPA:      80/20:      OTHER:  PRIMARY: [employer] Medicare HMO/PPo      Policy#: [id number]      Subscriber: patient CM Name: ***      Phone#: ***     Fax#: *** Pre-Cert#: ***      Employer: *** Benefits:  Phone #: ***     Name: *** [name]. Date: ***     Deduct: ***      Out of Pocket Max: ***      Life Max: *** CIR: ***      SNF: *** Outpatient: ***     Co-Pay: *** Home Health: ***      Co-Pay: *** DME: ***     Co-Pay: *** Providers: in-network SECONDARY:       Policy#:      Phone#:   Financial Counselor:       Phone#:   The Data processing manager" for patients in Inpatient Rehabilitation Facilities with attached "Privacy Act Statement-Health Care Records" was provided and verbally reviewed with: {CHL IP Patient Family VF:[id number]}  Emergency Contact Information Contact Information     Name Relation Home Work Mobile   Shackelford-Leduc,Lorraine Spouse [phone]  [phone]      Other Contacts     Name Relation Home Work Mobile   Harrington,[name] Daughter [phone]         Current Medical History  Patient Admitting Diagnosis: ITP History of Present Illness: Pt is a 78 year old male with medical hx significant for: ITP, prior CVA with residual left sided hemiaplasia, prior DVT on Xarelto, lupus anticoagulant, CAD, HTN, GERD, CKD stage IIIa, h/o seizure. Pt presented to [facility] on 07/04/23 d/t thrombocytopenia. Pt had had significant petechia over the last 24 hours prior to presentation. Hematologist recommended he go to ER for admission for IVIG. Pt recently admitted to hospital and treated with 4 days of high-dose Decadron. Treatment recommendation: dose of Nplate, Decadron 40 mg IV daily for 3 days and IVIG over the  weekend. Received on unit platelet on 3/22. Therapy evaluation completed and CIR recommended d/t pt's deficits in functional mobility.    Patient's medical record from [facility] has been reviewed by the rehabilitation admission coordinator and physician.  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 No  Family History   family history is not on file.  Current Medications  Current Facility-Administered Medications:    acetaminophen (TYLENOL) tablet 500 mg, 500 mg, Oral, QHS, Ghimire, Lyndel Safe, MD, 500 mg at 07/06/23 2131   acetaminophen (TYLENOL) tablet 500 mg, 500 mg, Oral, Daily PRN, Dorcas Carrow, MD   amLODipine (NORVASC) tablet 10 mg, 10 mg, Oral, QHS, Ghimire, Lyndel Safe, MD, 10 mg at 07/06/23 2132   amoxicillin-clavulanate (AUGMENTIN) 875-125 MG per tablet 1 tablet, 1 tablet, Oral, Q12H, Regalado, Belkys A, MD, 1 tablet at 07/07/23 1038   baclofen (LIORESAL) tablet 10 mg, 10 mg, Oral, BID, Dorcas Carrow, MD, 10 mg at 07/07/23 1038   buPROPion (WELLBUTRIN XL) 24 hr tablet 150 mg, 150 mg, Oral, q AM, Dorcas Carrow, MD, 150 mg at 07/07/23 1036   cholecalciferol (VITAMIN D3) 25 MCG (1000 UNIT) tablet 2,000 Units, 2,000 Units, Oral, QHS, Ghimire,  Lyndel Safe, MD, 2,000 Units at 07/06/23 2131   dexamethasone (DECADRON) 40 mg in sodium chloride 0.9 % 50 mL IVPB, 40 mg, Intravenous, q1800, Danford Bad, RPH, Last Rate: 216 mL/hr at 07/06/23 1808, 40 mg at 07/06/23 1808   fluticasone (FLONASE) 50 MCG/ACT nasal spray 2 spray, 2 spray, Each Nare, Daily PRN, Dorcas Carrow, MD   gabapentin (NEURONTIN) capsule 100 mg, 100 mg, Oral, QHS, Ghimire, Kuber, MD, 100 mg at 07/06/23 2132   HYDROcodone-acetaminophen (NORCO) 10-325 MG per tablet 1 tablet, 1 tablet, Oral, QHS, Ghimire, Lyndel Safe, MD, 1 tablet at 07/06/23 2132   HYDROcodone-acetaminophen (NORCO) 10-325 MG per tablet 1 tablet, 1 tablet, Oral, Daily PRN, Dorcas Carrow, MD   insulin aspart (novoLOG) injection 0-9 Units, 0-9 Units, Subcutaneous, TID WC, Regalado, Belkys A, MD, 3 Units at 07/07/23 1216   levETIRAcetam (KEPPRA) tablet 1,500 mg, 1,500 mg, Oral, BID, Ghimire, Kuber, MD, 1,500 mg at 07/07/23 1036   nystatin (MYCOSTATIN) 100000 UNIT/ML suspension 500,000 Units, 5 mL, Oral, QID, Ennever, Rose Phi, MD, 500,000 Units at 07/07/23 1331   oxymetazoline (AFRIN) 0.05 % nasal spray 1 spray, 1 spray, Each Nare, Q4H PRN, Anthoney Harada, NP   pantoprazole (PROTONIX) EC tablet 40 mg, 40 mg, Oral, Daily, Ghimire, Kuber, MD, 40 mg at 07/07/23 1038   rosuvastatin (CRESTOR) tablet 40 mg, 40 mg, Oral, QHS, Ghimire, Kuber, MD, 40 mg at 07/06/23 2132   sertraline (ZOLOFT) tablet 200 mg, 200 mg, Oral, q AM, Dorcas Carrow, MD, 200 mg at 07/07/23 1038  Patients Current Diet:  Diet Order             Diet vegetarian Room service appropriate? Yes; Fluid consistency: Thin  Diet effective now                   Precautions / Restrictions Precautions Precautions: Fall Precaution/Restrictions Comments: Left hemiplegia Restrictions Weight Bearing Restrictions Per Provider Order: No   Has the patient had 2 or more falls or a fall with injury in the past year? No  Prior Activity Level Limited Community  (1-2x/wk): MD appointments  Prior Functional Level Self Care: Did the patient need help bathing, dressing, using the toilet or eating? Needed some help  Indoor Mobility: Did the patient need assistance with walking from room to room (with or without device)? Needed some help  Stairs: Did the patient need assistance with internal or external stairs (with or without device)? Needed some help  Functional Cognition: Did the patient need help planning regular tasks such as shopping or remembering to take medications? Needed some help  Patient Information Are you of Hispanic, Latino/a,or Spanish origin?: X. Patient unable to respond, A. No, not of Hispanic, Latino/a, or Spanish origin What is your race?: X. Patient unable to respond, A. White Do you need or want an interpreter to communicate with a doctor or health care staff?: 9. Unable to respond  Patient's Response To:  Health Literacy and Transportation Is the patient able to respond to health literacy and transportation needs?: No Health Literacy - How often do you need to have someone help you when you read instructions, pamphlets, or other written material from your doctor or pharmacy?: Patient unable to respond  Home Assistive Devices / Equipment Home Equipment: Shower seat, BSC/3in1, Medical laboratory scientific officer - single point, Grab bars - toilet, Hand held shower head, Grab bars - tub/shower, Adaptive equipment, Wheelchair - manual, Transport chair  Prior Device Use: Indicate devices/aids used by the patient prior to current illness, exacerbation or injury? Manual wheelchair and Walker  Current Functional Level Cognition  Orientation Level: Oriented X4    Extremity Assessment (includes Sensation/Coordination)  Upper Extremity Assessment: Right hand dominant, LUE deficits/detail LUE Deficits / Details: pt with hx CVA.  L UE non functional, increased tone. Pt reports he does perform ROM as well as the HHOT helps. LUE Coordination: decreased fine motor,  decreased gross motor  Lower Extremity Assessment: LLE deficits/detail, Generalized weakness LLE Deficits / Details: baseline L hemiplegia, pt maintains L LE in knee extension and drop foot noted LLE Coordination: decreased fine motor, decreased gross motor    ADLs  Overall ADL's : Needs assistance/impaired Eating/Feeding: Set up Eating/Feeding Details (indicate cue type and reason): one handed techniques Grooming: Wash/dry hands, Minimal assistance Upper Body Bathing: Minimal assistance, Sitting Lower Body Bathing: Sit to/from stand, Sitting/lateral leans, Cueing for sequencing, +2 for physical assistance, +2 for safety/equipment, Moderate assistance Upper Body Dressing : Minimal assistance, Sitting Upper Body Dressing Details (indicate cue type and reason): using hemi techniques Lower Body Dressing: Sit to/from stand, Sitting/lateral leans, Moderate assistance, +2 for physical assistance, Cueing for safety, +2 for safety/equipment Toilet Transfer: Moderate assistance, +2 for physical assistance, +2 for safety/equipment, Cueing for safety, Cueing for sequencing, Stand-pivot Toileting- Clothing Manipulation and Hygiene: Maximal assistance, +2 for physical assistance, Sit to/from stand, Sitting/lateral lean, +2 for safety/equipment, Cueing for safety, Cueing for sequencing General ADL Comments: Spoke with pt and daugther.  Wife is struggling to care for pt at this time.  Pt needing increased A with ADL activity in which wife cant provide.  Daugther feels strongly pt needs rehab and OT also agrees.  Pt at a high fall risk and needs to be stonger to return home.    Mobility  Overal bed mobility: Needs Assistance Bed Mobility: Supine to Sit Rolling: Mod assist Supine to sit: HOB elevated, Used rails, Supervision General bed mobility comments: reliant on R extremities and hand rail    Transfers  Overall transfer level: Needs assistance Equipment used: Hemi-walker Transfers: Sit to/from  Stand Sit to Stand: Mod assist, +2 physical assistance, +2 safety/equipment Bed to/from chair/wheelchair/BSC transfer type:: Stand pivot General transfer comment: assist to rise and stabilize, increased right lateral-posterior lean (daughter reports this had worsened just prior to admission); able to correct with cues    Ambulation / Gait / Stairs / Wheelchair Mobility  Ambulation/Gait Ambulation/Gait assistance: Mod assist, +2 physical assistance Gait Distance (Feet): 22 Feet Assistive device: Hemi-walker Gait Pattern/deviations: Step-to pattern, Decreased weight shift to left, Decreased dorsiflexion - left General Gait Details: significant lean of trunk to right  posterior (weight over hemiwalker) and likely to compensate to advance L LE; mod assist due to weakness and balance; daughter followed with recliner; +2 with pt for safety Gait velocity: decreased    Posture / Balance Balance Overall balance assessment: Needs assistance Sitting-balance support: Feet supported, No upper extremity supported Sitting balance-Leahy Scale: Fair Postural control: Posterior lean, Right lateral lean Standing balance support: Single extremity supported, Reliant on assistive device for balance Standing balance-Leahy Scale: Zero    Special needs/care consideration Skin Abrasion: back/right, upper; Ecchymosis: abdomen, arm/scattered; Erythema/Redness: scattered; Petechiae: anterior, posterior, lower, upper, bilateral, Bladder incontinence and Diabetic management Novolog 0-9 units 3x daily with meals   Previous Home Environment (from acute therapy documentation) Living Arrangements: Spouse/significant other  Lives With: Spouse Available Help at Discharge: Family, Available 24 hours/day Type of Home: House Home Layout: One level Home Access: Ramped entrance Entrance Stairs-Rails: Can reach both Entrance Stairs-Number of Steps: 4 to porch then 1 step up into house Bathroom Shower/Tub: Teacher, music: Handicapped height Bathroom Accessibility: Yes How Accessible: Accessible via walker Home Care Services: Yes Type of Home Care Services: Home PT, Home OT Additional Comments: railings on bed  Discharge Living Setting Plans for Discharge Living Setting: Patient's home Type of Home at Discharge: House Discharge Home Layout: One level Discharge Home Access: Ramped entrance Discharge Bathroom Shower/Tub: Tub/shower unit Discharge Bathroom Toilet: Handicapped height Discharge Bathroom Accessibility: Yes How Accessible: Accessible via walker Does the patient have any problems obtaining your medications?: No  Social/Family/Support Systems Anticipated Caregiver: Karin Golden (wife) Anticipated Caregiver's Contact Information: [phone] Caregiver Availability: 24/7 Discharge Plan Discussed with Primary Caregiver: Yes Is Caregiver In Agreement with Plan?: Yes Does Caregiver/Family have Issues with Lodging/Transportation while Pt is in Rehab?: No  Goals Patient/Family Goal for Rehab: *** Expected length of stay: *** Pt/Family Agrees to Admission and willing to participate: Yes Program Orientation Provided & Reviewed with Pt/Caregiver Including Roles  & Responsibilities: Yes  Decrease burden of Care through IP rehab admission: NA  Possible need for SNF placement upon discharge: Not anticipated  Patient Condition: I have reviewed medical records from [facility], spoken with CM, and ***spouse. I discussed via phone for inpatient rehabilitation assessment.  Patient will benefit from ongoing PT and OT, can actively participate in 3 hours of therapy a day 5 days of the week, and can make measurable gains during the admission.  Patient will also benefit from the coordinated team approach during an Inpatient Acute Rehabilitation admission.  The patient will receive intensive therapy as well as Rehabilitation physician, nursing, social worker, and care management  interventions.  Due to bladder management, safety, skin/wound care, disease management, medication administration, pain management, and patient education the patient requires 24 hour a day rehabilitation nursing.  The patient is currently *** with mobility and basic ADLs.  Discharge setting and therapy post discharge at home with home health is anticipated.  Patient has agreed to participate in the Acute Inpatient Rehabilitation Program and will admit {Time; today/tomorrow:10263}.  Preadmission Screen Completed By:  Domingo Pulse, 07/07/2023 2:25 PM ______________________________________________________________________   Discussed status with Dr. Marland Kitchen on *** at *** and received approval for admission today.  Admission Coordinator:  Domingo Pulse, CCC-SLP, time ***/Date ***   Assessment/Plan: Diagnosis: *** Does the need for close, 24 hr/day Medical supervision in concert with the patient's rehab needs make it unreasonable for this patient to be served in a less intensive setting? {yes_no_potentially:3041433} Co-Morbidities requiring supervision/potential complications: *** Due to {due  NG:2952841}, does the patient require 24 hr/day rehab nursing? {yes_no_potentially:3041433} Does the patient require coordinated care of a physician, rehab nurse, PT, OT, and SLP to address physical and functional deficits in the context of the above medical diagnosis(es)? {yes_no_potentially:3041433} Addressing deficits in the following areas: {deficits:3041436} Can the patient actively participate in an intensive therapy program of at least 3 hrs of therapy 5 days a week? {yes_no_potentially:3041433} The potential for patient to make measurable gains while on inpatient rehab is {potential:3041437} Anticipated functional outcomes upon discharge from inpatient rehab: {functional outcomes:304600100} PT, {functional outcomes:304600100} OT, {functional outcomes:304600100} SLP Estimated rehab length of  stay to reach the above functional goals is: *** Anticipated discharge destination: {anticipated dc setting:21604} 10. Overall Rehab/Functional Prognosis: {potential:3041437}   MD Signature: ***

## 2023-07-07 NOTE — Plan of Care (Signed)
  Problem: Education: Goal: Knowledge of General Education information will improve Description: Including pain rating scale, medication(s)/side effects and non-pharmacologic comfort measures 07/07/2023 0710 by Hilliard Clark, RN Outcome: Progressing 07/07/2023 0710 by Hilliard Clark, RN Outcome: Progressing   Problem: Health Behavior/Discharge Planning: Goal: Ability to manage health-related needs will improve 07/07/2023 0710 by Hilliard Clark, RN Outcome: Progressing 07/07/2023 0710 by Hilliard Clark, RN Outcome: Progressing

## 2023-07-08 DIAGNOSIS — D693 Immune thrombocytopenic purpura: Secondary | ICD-10-CM | POA: Diagnosis not present

## 2023-07-08 LAB — COMPREHENSIVE METABOLIC PANEL
ALT: 76 U/L — ABNORMAL HIGH (ref 0–44)
AST: 61 U/L — ABNORMAL HIGH (ref 15–41)
Albumin: 2.9 g/dL — ABNORMAL LOW (ref 3.5–5.0)
Alkaline Phosphatase: 45 U/L (ref 38–126)
Anion gap: 4 — ABNORMAL LOW (ref 5–15)
BUN: 43 mg/dL — ABNORMAL HIGH (ref 8–23)
CO2: 20 mmol/L — ABNORMAL LOW (ref 22–32)
Calcium: 8.3 mg/dL — ABNORMAL LOW (ref 8.9–10.3)
Chloride: 109 mmol/L (ref 98–111)
Creatinine, Ser: 1.39 mg/dL — ABNORMAL HIGH (ref 0.61–1.24)
GFR, Estimated: 52 mL/min — ABNORMAL LOW (ref >60)
Glucose, Bld: 238 mg/dL — ABNORMAL HIGH (ref 70–99)
Potassium: 3.8 mmol/L (ref 3.5–5.1)
Sodium: 133 mmol/L — ABNORMAL LOW (ref 135–145)
Total Bilirubin: 0.6 mg/dL (ref 0.0–1.2)
Total Protein: 8.5 g/dL — ABNORMAL HIGH (ref 6.5–8.1)

## 2023-07-08 LAB — CBC WITH DIFFERENTIAL/PLATELET
Abs Immature Granulocytes: 0.43 10*3/uL — ABNORMAL HIGH (ref 0.00–0.07)
Basophils Absolute: 0.1 10*3/uL (ref 0.0–0.1)
Basophils Relative: 0 %
Eosinophils Absolute: 0 10*3/uL (ref 0.0–0.5)
Eosinophils Relative: 0 %
HCT: 33.3 % — ABNORMAL LOW (ref 39.0–52.0)
Hemoglobin: 10.5 g/dL — ABNORMAL LOW (ref 13.0–17.0)
Immature Granulocytes: 2 %
Lymphocytes Relative: 7 %
Lymphs Abs: 1.3 10*3/uL (ref 0.7–4.0)
MCH: 30.1 pg (ref 26.0–34.0)
MCHC: 31.5 g/dL (ref 30.0–36.0)
MCV: 95.4 fL (ref 80.0–100.0)
Monocytes Absolute: 1.1 10*3/uL — ABNORMAL HIGH (ref 0.1–1.0)
Monocytes Relative: 6 %
Neutro Abs: 15.4 10*3/uL — ABNORMAL HIGH (ref 1.7–7.7)
Neutrophils Relative %: 85 %
Platelets: 180 10*3/uL (ref 150–400)
RBC: 3.49 MIL/uL — ABNORMAL LOW (ref 4.22–5.81)
RDW: 17.5 % — ABNORMAL HIGH (ref 11.5–15.5)
WBC: 18.4 10*3/uL — ABNORMAL HIGH (ref 4.0–10.5)
nRBC: 0.3 % — ABNORMAL HIGH (ref 0.0–0.2)

## 2023-07-08 LAB — GLUCOSE, CAPILLARY
Glucose-Capillary: 210 mg/dL — ABNORMAL HIGH (ref 70–99)
Glucose-Capillary: 186 mg/dL — ABNORMAL HIGH (ref 70–99)
Glucose-Capillary: 187 mg/dL — ABNORMAL HIGH (ref 70–99)
Glucose-Capillary: 207 mg/dL — ABNORMAL HIGH (ref 70–99)

## 2023-07-08 MED ORDER — LIVING WELL WITH DIABETES BOOK
Freq: Once | Status: DC
  Filled 2023-07-08: qty 1

## 2023-07-08 MED ORDER — RIVAROXABAN 10 MG PO TABS
20.0000 mg | ORAL_TABLET | Freq: Every day | ORAL | Status: DC
  Administered 2023-07-08: 20 mg via ORAL
  Filled 2023-07-08: qty 2

## 2023-07-08 NOTE — Consult Note (Signed)
 [facility] Liaison Consult Note   07/08/2023  URAL ACREE 1945-11-17 [id number]  Insurance: [employer]  Primary Care Provider: Laurann Montana, MD, with Deboraha Sprang at [facility], this provider is listed for the transition of care follow up appointments  and [facility] Team calls   [facility] Liaison screened the patient remotely at [facility].    The patient was screened for 30 day readmission hospitalization with noted medium risk score for unplanned readmission risk 2 hospital admissions in 6 months.  The patient was assessed for potential [facility] Coordination service needs for post hospital transition for care coordination. Review of patient's electronic medical record reveals patient is being recommended for CIR for post hospital transition needs..   Plan: [facility] Liaison will continue to follow progress and disposition to assess for post hospital community care coordination/management needs.  Referral request for community care coordination: Pending disposition.   [facility], Population Health does not replace or interfere with any arrangements made by the Inpatient Transition of Care team.   For questions contact:   Charlesetta Shanks, RN, BSN, CCM [place]  [facility] Liaison Direct Dial: [phone] or secure chat Email: [email or web address]

## 2023-07-08 NOTE — Plan of Care (Signed)
   Problem: Education: Goal: Knowledge of General Education information will improve Description Including pain rating scale, medication(s)/side effects and non-pharmacologic comfort measures Outcome: Progressing   Problem: Health Behavior/Discharge Planning: Goal: Ability to manage health-related needs will improve Outcome: Progressing

## 2023-07-08 NOTE — Progress Notes (Signed)
 Occupational Therapy Treatment Patient Details Name: Mark [name] MRN: [id number] DOB: [birth year] Today's Date: 07/08/2023   History of present illness Mark [name] is a 78 y.o. male presented from oncology clinic with severe thrombocytopenia.  Patient was recently admitted to the hospital 2/24-2/28/25  and treated with 4 days of high-dose Decadron and discharged home with improvement of platelet counts.  He went to follow-up with Dr. Myna Hidalgo and found to have platelets of 17 with petechiae and intermittent epistaxis at home so was sent to the ER. Past medical history significant of ITP, lupus anticoagulant, coronary artery disease, hypertension, stroke with residual left sided hemiaplasia, hyperlipidemia, CKD stage IIIa, GERD and seizure   OT comments  Patient was motivated to participate in session. Patient was able to engage in seated toileting hygiene with mod A to complete task with good efforts put forth. Patient would continue to benefit from skilled OT services at this time while admitted and after d/c to address noted deficits in order to improve overall safety and independence in ADLs. Patient will benefit from intensive inpatient follow-up therapy, >3 hours/day.       If plan is discharge home, recommend the following:  A lot of help with walking and/or transfers;A lot of help with bathing/dressing/bathroom;Assistance with cooking/housework;Assistance with feeding;Direct supervision/assist for medications management;Direct supervision/assist for financial management;Assist for transportation;Help with stairs or ramp for entrance;Supervision due to cognitive status   Equipment Recommendations  None recommended by OT       Precautions / Restrictions Precautions Precautions: Fall Precaution/Restrictions Comments: Left hemiplegia Restrictions Weight Bearing Restrictions Per Provider Order: No       Mobility Bed Mobility Overal bed mobility: Needs Assistance Bed Mobility: Sit  to Supine       Sit to supine: Min assist   General bed mobility comments: to get BLE back into bed               ADL either performed or assessed with clinical judgement   ADL Overall ADL's : Needs assistance/impaired Eating/Feeding: Set up Eating/Feeding Details (indicate cue type and reason): one handed techniques Grooming: Wash/dry hands;Wash/dry face;Sitting;Brushing hair;Set up Grooming Details (indicate cue type and reason): declined oral hygiene at this time Upper Body Bathing: Minimal assistance;Sitting Upper Body Bathing Details (indicate cue type and reason): in recliner         Lower Body Dressing: Maximal assistance Lower Body Dressing Details (indicate cue type and reason): attempted to help with one handed texhniques in standing to pull up adult absorbent undergarments. Toilet Transfer: Moderate assistance;+2 for physical assistance;+2 for safety/equipment;Cueing for safety;Cueing for sequencing;Stand-pivot Toilet Transfer Details (indicate cue type and reason): cues for hand positioning Toileting- Clothing Manipulation and Hygiene: Maximal assistance;Sitting/lateral lean;Sit to/from stand Toileting - Clothing Manipulation Details (indicate cue type and reason): patient was able to lean sitting on commode to complete part of hygiene tasks with mod A to complete task in standing with patient needing one UE support to maintain balance.             Communication Communication Communication: Impaired Factors Affecting Communication: Hearing impaired   Cognition Arousal: Alert Behavior During Therapy: WFL for tasks assessed/performed Cognition: History of cognitive impairments                          Pertinent Vitals/ Pain       Pain Assessment Pain Assessment: No/denies pain         Frequency  Min 2X/week  Progress Toward Goals  OT Goals(current goals can now be found in the care plan section)  Progress towards OT goals:  Progressing toward goals     Plan         AM-PAC OT "6 Clicks" Daily Activity     Outcome Measure   Help from another person eating meals?: A Little Help from another person taking care of personal grooming?: A Little Help from another person toileting, which includes using toliet, bedpan, or urinal?: Total Help from another person bathing (including washing, rinsing, drying)?: A Lot Help from another person to put on and taking off regular upper body clothing?: A Lot Help from another person to put on and taking off regular lower body clothing?: A Lot 6 Click Score: 13    End of Session Equipment Utilized During Treatment: Gait belt;Other (comment) (BSC)  OT Visit Diagnosis: Unsteadiness on feet (R26.81);Other abnormalities of gait and mobility (R26.89);Muscle weakness (generalized) (M62.81);Other symptoms and signs involving the nervous system (R29.898);Hemiplegia and hemiparesis Hemiplegia - Right/Left: Left Hemiplegia - dominant/non-dominant: Non-Dominant Hemiplegia - caused by: Other cerebrovascular disease   Activity Tolerance Patient tolerated treatment well   Patient Left in bed;with call bell/phone within reach;with bed alarm set   Nurse Communication Mobility status;Precautions        Time: 6578-4696 OT Time Calculation (min): 42 min  Charges: OT General Charges $OT Visit: 1 Visit OT Treatments $Self Care/Home Management : 38-52 mins  Rosalio Loud, MS Acute Rehabilitation Department Office# [phone]   Selinda Flavin 07/08/2023, 2:13 PM

## 2023-07-08 NOTE — Plan of Care (Signed)

## 2023-07-08 NOTE — Progress Notes (Signed)
 Physical Therapy Treatment Patient Details Name: Mark [name] MRN: [id number] DOB: [birth year] Today's Date: 07/08/2023   History of Present Illness Mark [name] is a 78 y.o. male presented from oncology clinic with severe thrombocytopenia.  Patient was recently admitted to the hospital 2/24-2/28/25  and treated with 4 days of high-dose Decadron and discharged home with improvement of platelet counts.  He went to follow-up with Dr. Myna Hidalgo and found to have platelets of 17 with petechiae and intermittent epistaxis at home so was sent to the ER. Past medical history significant of ITP, lupus anticoagulant, coronary artery disease, hypertension, stroke with residual left sided hemiaplasia, hyperlipidemia, CKD stage IIIa, GERD and seizure    PT Comments  Pt tolerated increased ambulation distance of 58' with hemiwalker today. Initially upon sit to stand pt had significant loss of balance requiring +2 assist to prevent a fall, pt had scissoring of LEs.  Pt puts forth good effort. Ambulation distance limited by fatigue. He is a very high falls risk and needs +2 assistance for safety when mobilizing.    If plan is discharge home, recommend the following: Two people to help with walking and/or transfers;A lot of help with bathing/dressing/bathroom;Assistance with cooking/housework;Assist for transportation;Help with stairs or ramp for entrance   Can travel by private vehicle        Equipment Recommendations  None recommended by PT    Recommendations for Other Services       Precautions / Restrictions Precautions Precautions: Fall Precaution/Restrictions Comments: Left hemiplegia Restrictions Weight Bearing Restrictions Per Provider Order: No     Mobility  Bed Mobility Overal bed mobility: Needs Assistance Bed Mobility: Supine to Sit     Supine to sit: HOB elevated, Used rails, Supervision     General bed mobility comments: reliant on R extremities and hand rail, increased  time/effort    Transfers Overall transfer level: Needs assistance Equipment used: Hemi-walker Transfers: Sit to/from Stand Sit to Stand: Mod assist, +2 physical assistance, +2 safety/equipment           General transfer comment: assist to rise and stabilize, pt had loss of balance requiring +2 mod assist 2* scissoring; VCs hand placement for stand to sit, uncontrolled descent    Ambulation/Gait Ambulation/Gait assistance: Min assist, +2 safety/equipment Gait Distance (Feet): 35 Feet Assistive device: Hemi-walker Gait Pattern/deviations: Step-to pattern, Decreased weight shift to left, Decreased dorsiflexion - left, Narrow base of support       General Gait Details: significant lean of trunk to right posterior (weight over hemiwalker) and likely to compensate to advance L LE; min assist due to weakness and balance; followed with recliner as pt fatigued and became unsteady quickly; +2 with pt for safety. VCs to widen BOS.   Stairs             Wheelchair Mobility     Tilt Bed    Modified Rankin (Stroke Patients Only)       Balance Overall balance assessment: Needs assistance Sitting-balance support: Feet supported, No upper extremity supported Sitting balance-Leahy Scale: Fair   Postural control: Posterior lean, Right lateral lean Standing balance support: Single extremity supported, Reliant on assistive device for balance, During functional activity Standing balance-Leahy Scale: Zero                              Communication Communication Communication: Impaired Factors Affecting Communication: Hearing impaired  Cognition Arousal: Alert Behavior During Therapy: WFL for tasks assessed/performed  PT - Cognitive impairments: No apparent impairments                         Following commands: Intact      Cueing Cueing Techniques: Verbal cues  Exercises      General Comments        Pertinent Vitals/Pain Pain  Assessment Pain Assessment: 0-10 Pain Score: 1  Pain Location: back Pain Descriptors / Indicators: Sore Pain Intervention(s): Limited activity within patient's tolerance, Monitored during session, Repositioned    Home Living                          Prior Function            PT Goals (current goals can now be found in the care plan section) Acute Rehab PT Goals Patient Stated Goal: return home, get stronger PT Goal Formulation: With patient/family Time For Goal Achievement: 06/24/23 Potential to Achieve Goals: Good Progress towards PT goals: Progressing toward goals    Frequency    Min 2X/week      PT Plan      Co-evaluation              AM-PAC PT "6 Clicks" Mobility   Outcome Measure  Help needed turning from your back to your side while in a flat bed without using bedrails?: A Lot Help needed moving from lying on your back to sitting on the side of a flat bed without using bedrails?: A Lot Help needed moving to and from a bed to a chair (including a wheelchair)?: Total Help needed standing up from a chair using your arms (e.g., wheelchair or bedside chair)?: Total Help needed to walk in hospital room?: A Lot Help needed climbing 3-5 steps with a railing? : Total 6 Click Score: 9    End of Session Equipment Utilized During Treatment: Gait belt Activity Tolerance: Patient tolerated treatment well;Patient limited by fatigue Patient left: in chair;with chair alarm set;with call bell/phone within reach Nurse Communication: Mobility status PT Visit Diagnosis: Unsteadiness on feet (R26.81);Muscle weakness (generalized) (M62.81);Other abnormalities of gait and mobility (R26.89);Difficulty in walking, not elsewhere classified (R26.2) Hemiplegia - Right/Left: Left     Time: 6962-9528 PT Time Calculation (min) (ACUTE ONLY): 32 min  Charges:    $Gait Training: 8-22 mins $Therapeutic Activity: 8-22 mins PT General Charges $$ ACUTE PT VISIT: 1  Visit                     Tamala Ser PT 07/08/2023  Acute Rehabilitation Services  Office [phone]

## 2023-07-08 NOTE — Inpatient Diabetes Management (Signed)
 Inpatient Diabetes Program Recommendations  AACE/ADA: New Consensus Statement on Inpatient Glycemic Control (2015)  Target Ranges:  Prepandial:   less than 140 mg/dL      Peak postprandial:   less than 180 mg/dL (1-2 hours)      Critically ill patients:  140 - 180 mg/dL   Lab Results  Component Value Date   GLUCAP 186 (H) 07/08/2023   HGBA1C 6.2 (H) 07/06/2023    Review of Glycemic Control  Diabetes history: Pre-diabetes (HgbA1C 6.2%) Outpatient Diabetes medications: None Current orders for Inpatient glycemic control: Novolog 0-9 TID  Inpatient Diabetes Program Recommendations:    Add Novolog 0-5 for HS correction  Spoke with pt at bedside regarding his pre-diabetes and HgbA1C 6.2%. Pt states he eats healthy most of the time and is very aware of carbohydrates and portion control. States he drinks juice in mornings and we discussed a better option would be to eat an orange or pineapple instead of drinking the juice. Discussed glucose and A1C goals.  Explained how hyperglycemia can lead to damage within blood vessels which lead to the common complications seen with uncontrolled diabetes.  Ordered Living Well with Diabetes book for reference with diet and making healthy choices. States he is going to start cooking again and feels good about knowing what to eat for good health.  Appreciative of visit.   Thank you. Ailene Ards, RD, LDN, CDCES Inpatient Diabetes Coordinator [phone]

## 2023-07-08 NOTE — Progress Notes (Signed)
 Physical Medicine & Rehabilitation Consult Service  Pt discussed with rehab admissions coordinator. Chart has been reviewed. This is a 78 yo male with a history of, lupus anticoagulant, a right CVA (residual left hemiparesis) as well as a recent 4 day hospitalization for ITP who was admitted with acute relapse of ITP on 07/04/23. Treated with IV decadron and IVIG. Course complicated by recurrent epistaxis. Recent hospitalizations and medical course has left patient deconditioned. He was last up with therapy on 3/23 and transferred with mod assist +2 and walked 22' using hemi-walker with mod assist +2 as well. Pt lives with spouse in one level home with 4 steps to enter. PTA (until recently) pt used hemiwalker for gait with supervision and wife utilized a gait belt on occasion as needed.      Assessment: Debility related to recurrent ITP and associated medical sequelae Hx of left hemiparesis after CVA   Plan:  This patient would benefit from acute inpatient rehab to address functional mobility, endurance, self-care, safety. Additionally, the patient requires daily MD oversight of the active medical issues noted above. Projected goals would be supervision to occasional min assist. Dispo and social supports are appropriate. ELOS 7-10 days   Rehab Admissions Coordinator to follow up.    Ranelle Oyster, MD, [facility] Physical Medicine & Rehabilitation Medical Director Rehabilitation Services 07/08/2023

## 2023-07-08 NOTE — Progress Notes (Signed)
 PROGRESS NOTE    Mark [name]  ZOX:[id number] DOB: 1945-05-04 DOA: 07/04/2023 PCP: Laurann Montana, MD   Brief Narrative: 78 year old with past medical history significant for ITP, lupus anticoagulant, coronary artery disease, hypertension, stroke with residual left-sided Hemi paresis, hyperlipidemia, CKD stage IIIa, GERD, history of seizure presented to the oncology clinic with severe thrombocytopenia.  Patient recently admitted to the hospital and treated with 4 days of high-dose Decadron and discharged home with improvement in platelet counts.  He saw Dr. Myna Hidalgo at the clinic and was found to have platelet counts again of 17, admitted for further treatment and evaluation.   Patient was treated with IVIG for 3 days and IV Decadron.  Thrombocytopenia has resolved.  PT OT is recommending CIR.  Xarelto has been resumed today.  Awaiting insurance approval for CIR   Assessment & Plan:   Principal Problem:   Acute ITP (HCC) Active Problems:   Ischemic stroke (HCC)   Hemiparesis, left (HCC)   1-Acute ITP with relapse: -Present with recurrent thrombocytopenia -Dr. Myna Hidalgo following and recommended a dose of Nplate. -Received Decadron 40 mg IV daily for 3 days and IVIG.  -Oncology considering Rituxan out patient. .  Recurrent episode of epistaxis. Received one unit platelet 3/22. Platelet count increase to 180  Epistaxis. In setting of thrombocytopenia.  Has some nasal  packing, received Augmentin.  Leukocytosis could be form nplate.  Resolved  2-History of ischemic stroke with left Hemiparesis, lupus anticoagulant positive, history of DVT: -Continue to hold Xarelto and aspirin due to high risk for bleeding in the setting of severe thrombocytopenia -Discussed with Dr Myna Hidalgo, ok to resume Xarelto.   Essential hypertension: Continue amlodipine  CKD 3A: Continue to monitor Cr baseline 1.3  History of seizure: Continue with Keppra twice daily  Anxiety, Depression: Continue  bupropion and sertraline  Pre-Diabetics.  Hyperglycemia in setting steroids.  SSI.  Discussed diet modification with patient.   GUN; patient does intermittent catheterization at home, continue    Estimated body mass index is 25.89 kg/m as calculated from the following:   Height as of this encounter: 6' (1.829 m).   Weight as of this encounter: 86.6 kg.   DVT prophylaxis: SCD Code Status: DNR Family Communication: daughter and sister at bedside.  Disposition Plan:  Status is: Inpatient Remains inpatient appropriate because: management of ITP    Consultants:  Oncology   Procedures:    Antimicrobials:    Subjective: He is alert and conversant, very pleasant.  No fever epistaxis.  He had a bowel movement yesterday.   Objective: Vitals:   07/07/23 1414 07/07/23 2121 07/08/23 0545 07/08/23 1328  BP: (!) 145/87 (!) 145/86 136/76 131/81  Pulse: 68 78 65 76  Resp:  18 18   Temp: 97.7 F (36.5 C) 98.2 F (36.8 C) 97.6 F (36.4 C) (!) 97.3 F (36.3 C)  TempSrc: Oral Oral Oral Oral  SpO2: 95% 96% 93% 94%  Weight:      Height:        Intake/Output Summary (Last 24 hours) at 07/08/2023 1506 Last data filed at 07/08/2023 1340 Gross per 24 hour  Intake 1254 ml  Output 1400 ml  Net -146 ml   Filed Weights   07/04/23 1517 07/06/23 0100  Weight: 79.4 kg 86.6 kg    Examination:  General exam: No acute distress Respiratory system: Normal respiratory effort, clear to auscultation Cardiovascular system: S1, S2 regular rhythm or rate Gastrointestinal system: Bowel sounds present, soft nontender nondistended Central nervous system: Alert and  oriented. Left Upper extremity hemiparesis.  Extremities:no edema    Data Reviewed: I have personally reviewed following labs and imaging studies  CBC: Recent Labs  Lab 07/04/23 1327 07/05/23 0529 07/06/23 0515 07/07/23 0427 07/08/23 0450  WBC 10.6* 8.9 17.7* 19.1* 18.4*  NEUTROABS 5.3  --  15.1* 16.5* 15.4*  HGB  12.9* 11.0* 10.6* 10.8* 10.5*  HCT 39.7 33.6* 33.9* 34.9* 33.3*  MCV 92.3 93.9 95.2 96.1 95.4  PLT 17* 14* 44* 86* 180   Basic Metabolic Panel: Recent Labs  Lab 07/04/23 1334 07/06/23 0515 07/07/23 0427 07/08/23 0450  NA 140 132* 133* 133*  K 4.7 3.9 3.8 3.8  CL 105 103 105 109  CO2 27 23 20* 20*  GLUCOSE 106* 258* 215* 238*  BUN 24* 40* 50* 43*  CREATININE 1.30* 1.37* 1.23 1.39*  CALCIUM 9.6 8.8* 8.6* 8.3*   GFR: Estimated Creatinine Clearance: 48.1 mL/min (A) (by C-G formula based on SCr of 1.39 mg/dL (H)). Liver Function Tests: Recent Labs  Lab 07/04/23 1334 07/06/23 0515 07/07/23 0427 07/08/23 0450  AST 35 37 32 61*  ALT 32 40 38 76*  ALKPHOS 70 57 52 45  BILITOT 0.4 0.3 0.5 0.6  PROT 7.8 8.9* 9.4* 8.5*  ALBUMIN 4.3 3.2* 3.1* 2.9*   No results for input(s): "LIPASE", "AMYLASE" in the last 168 hours. No results for input(s): "AMMONIA" in the last 168 hours. Coagulation Profile: No results for input(s): "INR", "PROTIME" in the last 168 hours. Cardiac Enzymes: No results for input(s): "CKTOTAL", "CKMB", "CKMBINDEX", "TROPONINI" in the last 168 hours. BNP (last 3 results) No results for input(s): "PROBNP" in the last 8760 hours. HbA1C: Recent Labs    07/06/23 0849  HGBA1C 6.2*   CBG: Recent Labs  Lab 07/07/23 1125 07/07/23 1657 07/07/23 2120 07/08/23 0726 07/08/23 1139  GLUCAP 230* 162* 246* 210* 186*   Lipid Profile: No results for input(s): "CHOL", "HDL", "LDLCALC", "TRIG", "CHOLHDL", "LDLDIRECT" in the last 72 hours. Thyroid Function Tests: No results for input(s): "TSH", "T4TOTAL", "FREET4", "T3FREE", "THYROIDAB" in the last 72 hours. Anemia Panel: No results for input(s): "VITAMINB12", "FOLATE", "FERRITIN", "TIBC", "IRON", "RETICCTPCT" in the last 72 hours. Sepsis Labs: No results for input(s): "PROCALCITON", "LATICACIDVEN" in the last 168 hours.  No results found for this or any previous visit (from the past 240 hours).       Radiology  Studies: No results found.      Scheduled Meds:  acetaminophen  500 mg Oral QHS   amLODipine  10 mg Oral QHS   baclofen  10 mg Oral BID   buPROPion  150 mg Oral q AM   cholecalciferol  2,000 Units Oral QHS   gabapentin  100 mg Oral QHS   HYDROcodone-acetaminophen  1 tablet Oral QHS   insulin aspart  0-9 Units Subcutaneous TID WC   levETIRAcetam  1,500 mg Oral BID   living well with diabetes book   Does not apply Once   nystatin  5 mL Oral QID   pantoprazole  40 mg Oral Daily   rivaroxaban  20 mg Oral Q supper   sertraline  200 mg Oral q AM   Continuous Infusions:     LOS: 4 days    Time spent: 35 minutes    Rockland Kotarski A Dante Roudebush, MD [facility]   If 7PM-7AM, please contact night-coverage [email or web address]  07/08/2023, 3:06 PM

## 2023-07-08 NOTE — Progress Notes (Signed)
 Inpatient Rehabilitation Admissions Coordinator   I spoke with his wife by phone to reviewed estimated cost of care for CIR pending [employer] approval.   Ottie Glazier, RN, MSN Rehab Admissions Coordinator [phone] 07/08/2023 5:12 PM

## 2023-07-08 NOTE — TOC Progression Note (Signed)
 Transition of Care [facility]) - Progression Note    Patient Details  Name: Mark [name] MRN: [id number] Date of Birth: [birth year]  Transition of Care [facility]) CM/SW Contact  Howell Rucks, RN Phone Number: 07/08/2023, 11:50 AM  Clinical Narrative:   [facility] Inpatient Rehab eval completed, patient meets criteria,  CIR will begin Auth with [employer] Medicare today    Expected Discharge Plan: Home w Home Health Services Barriers to Discharge: Continued Medical Work up  Expected Discharge Plan and Services   Discharge Planning Services: CM Consult   Living arrangements for the past 2 months: Single Family Home                                       Social Determinants of Health (SDOH) Interventions SDOH Screenings   Food Insecurity: No Food Insecurity (07/06/2023)  Housing: Low Risk  (07/06/2023)  Transportation Needs: No Transportation Needs (07/06/2023)  Utilities: Not At Risk (07/06/2023)  Social Connections: Moderately Integrated (07/04/2023)  Tobacco Use: Medium Risk (07/04/2023)    Readmission Risk Interventions    07/06/2023    1:53 PM  Readmission Risk Prevention Plan  Transportation Screening Complete  PCP or Specialist Appt within 5-7 Days Complete  Home Care Screening Complete  Medication Review (RN CM) Complete

## 2023-07-08 NOTE — Progress Notes (Signed)
 Inpatient Rehabilitation Admissions Coordinator   I will begin Auth with [facility] today once I have updated PT and OT updates.  Ottie Glazier, RN, MSN Rehab Admissions Coordinator [phone] 07/08/2023 11:03 AM

## 2023-07-08 NOTE — Progress Notes (Signed)
 I am not surprised that his platelet count is now up to 180,000.  He has always responded nicely to the immunosuppressive therapy.  I think he may have 1 more day of Decadron.  I think that he is going to clearly need Rituxan as an outpatient.  I suspect this should be able to get him into a long-lasting remission.  Now that his platelet count is up, we can probably get him back on anticoagulation.  I know that it is a "fine line" with respect to clotting and bleeding for him.  It sounds like he is going to be going over to the [facility] Inpatient Rehab unit.  Hopefully, he will be able to get some good physical therapy over there.  I would make sure that we follow his platelet count every couple days while over there.  He is eating well.  The steroids certainly make him hungry.  He has had no problems with bowels or bladder.  He has had no nausea or vomiting.  He has had no mouth sores.  There is been no thrush.  Again I have no problems from my point of view to get him over to the CIR.  I do appreciate the great care that he is gotten from everybody over on [address].  Christin Bach, MD

## 2023-07-08 NOTE — Progress Notes (Signed)
 Spoke to 3/24 Richelle RN and Retail banker she gave the entire dose of Privigen  80mg  on 3/21

## 2023-07-09 ENCOUNTER — Other Ambulatory Visit: Payer: Self-pay

## 2023-07-09 ENCOUNTER — Encounter (HOSPITAL_COMMUNITY): Payer: Self-pay | Admitting: Physical Medicine and Rehabilitation

## 2023-07-09 ENCOUNTER — Inpatient Hospital Stay (HOSPITAL_COMMUNITY)
Admission: AD | Admit: 2023-07-09 | Discharge: 2023-07-22 | DRG: 945 | Disposition: A | Discharge status: Home Health | Source: Intra-hospital | Attending: Physical Medicine and Rehabilitation | Admitting: Physical Medicine and Rehabilitation

## 2023-07-09 DIAGNOSIS — G8194 Hemiplegia, unspecified affecting left nondominant side: Secondary | ICD-10-CM

## 2023-07-09 DIAGNOSIS — I10 Essential (primary) hypertension: Secondary | ICD-10-CM | POA: Diagnosis not present

## 2023-07-09 DIAGNOSIS — N1831 Chronic kidney disease, stage 3a: Secondary | ICD-10-CM | POA: Diagnosis present

## 2023-07-09 DIAGNOSIS — D6862 Lupus anticoagulant syndrome: Secondary | ICD-10-CM | POA: Diagnosis present

## 2023-07-09 DIAGNOSIS — I2510 Atherosclerotic heart disease of native coronary artery without angina pectoris: Secondary | ICD-10-CM | POA: Diagnosis present

## 2023-07-09 DIAGNOSIS — Z9861 Coronary angioplasty status: Secondary | ICD-10-CM | POA: Diagnosis not present

## 2023-07-09 DIAGNOSIS — Z87891 Personal history of nicotine dependence: Secondary | ICD-10-CM

## 2023-07-09 DIAGNOSIS — D72829 Elevated white blood cell count, unspecified: Secondary | ICD-10-CM | POA: Diagnosis not present

## 2023-07-09 DIAGNOSIS — R7303 Prediabetes: Secondary | ICD-10-CM | POA: Insufficient documentation

## 2023-07-09 DIAGNOSIS — G40909 Epilepsy, unspecified, not intractable, without status epilepticus: Secondary | ICD-10-CM | POA: Diagnosis not present

## 2023-07-09 DIAGNOSIS — Z888 Allergy status to other drugs, medicaments and biological substances status: Secondary | ICD-10-CM | POA: Diagnosis not present

## 2023-07-09 DIAGNOSIS — B029 Zoster without complications: Secondary | ICD-10-CM | POA: Diagnosis present

## 2023-07-09 DIAGNOSIS — I69354 Hemiplegia and hemiparesis following cerebral infarction affecting left non-dominant side: Secondary | ICD-10-CM

## 2023-07-09 DIAGNOSIS — R21 Rash and other nonspecific skin eruption: Secondary | ICD-10-CM

## 2023-07-09 DIAGNOSIS — I129 Hypertensive chronic kidney disease with stage 1 through stage 4 chronic kidney disease, or unspecified chronic kidney disease: Secondary | ICD-10-CM | POA: Diagnosis not present

## 2023-07-09 DIAGNOSIS — M79641 Pain in right hand: Secondary | ICD-10-CM | POA: Diagnosis present

## 2023-07-09 DIAGNOSIS — Z7901 Long term (current) use of anticoagulants: Secondary | ICD-10-CM | POA: Diagnosis not present

## 2023-07-09 DIAGNOSIS — G629 Polyneuropathy, unspecified: Secondary | ICD-10-CM | POA: Diagnosis present

## 2023-07-09 DIAGNOSIS — R5381 Other malaise: Principal | ICD-10-CM | POA: Diagnosis present

## 2023-07-09 DIAGNOSIS — R197 Diarrhea, unspecified: Secondary | ICD-10-CM | POA: Diagnosis not present

## 2023-07-09 DIAGNOSIS — F419 Anxiety disorder, unspecified: Secondary | ICD-10-CM | POA: Diagnosis present

## 2023-07-09 DIAGNOSIS — Z7982 Long term (current) use of aspirin: Secondary | ICD-10-CM | POA: Diagnosis not present

## 2023-07-09 DIAGNOSIS — E559 Vitamin D deficiency, unspecified: Secondary | ICD-10-CM | POA: Diagnosis present

## 2023-07-09 DIAGNOSIS — R252 Cramp and spasm: Secondary | ICD-10-CM | POA: Diagnosis present

## 2023-07-09 DIAGNOSIS — R7401 Elevation of levels of liver transaminase levels: Secondary | ICD-10-CM | POA: Diagnosis present

## 2023-07-09 DIAGNOSIS — H9190 Unspecified hearing loss, unspecified ear: Secondary | ICD-10-CM | POA: Diagnosis present

## 2023-07-09 DIAGNOSIS — I639 Cerebral infarction, unspecified: Secondary | ICD-10-CM

## 2023-07-09 DIAGNOSIS — Z91014 Allergy to mammalian meats: Secondary | ICD-10-CM

## 2023-07-09 DIAGNOSIS — G4730 Sleep apnea, unspecified: Secondary | ICD-10-CM | POA: Diagnosis not present

## 2023-07-09 DIAGNOSIS — Z86718 Personal history of other venous thrombosis and embolism: Secondary | ICD-10-CM

## 2023-07-09 DIAGNOSIS — R339 Retention of urine, unspecified: Secondary | ICD-10-CM | POA: Diagnosis present

## 2023-07-09 DIAGNOSIS — M7989 Other specified soft tissue disorders: Secondary | ICD-10-CM | POA: Diagnosis present

## 2023-07-09 DIAGNOSIS — Z9081 Acquired absence of spleen: Secondary | ICD-10-CM

## 2023-07-09 DIAGNOSIS — N319 Neuromuscular dysfunction of bladder, unspecified: Secondary | ICD-10-CM | POA: Diagnosis present

## 2023-07-09 DIAGNOSIS — F32A Depression, unspecified: Secondary | ICD-10-CM | POA: Diagnosis not present

## 2023-07-09 DIAGNOSIS — M792 Neuralgia and neuritis, unspecified: Secondary | ICD-10-CM

## 2023-07-09 DIAGNOSIS — I69391 Dysphagia following cerebral infarction: Secondary | ICD-10-CM | POA: Diagnosis not present

## 2023-07-09 DIAGNOSIS — E876 Hypokalemia: Secondary | ICD-10-CM | POA: Diagnosis not present

## 2023-07-09 DIAGNOSIS — D693 Immune thrombocytopenic purpura: Secondary | ICD-10-CM | POA: Diagnosis not present

## 2023-07-09 DIAGNOSIS — R0602 Shortness of breath: Secondary | ICD-10-CM | POA: Diagnosis present

## 2023-07-09 DIAGNOSIS — R040 Epistaxis: Secondary | ICD-10-CM | POA: Diagnosis not present

## 2023-07-09 DIAGNOSIS — M21372 Foot drop, left foot: Secondary | ICD-10-CM | POA: Diagnosis present

## 2023-07-09 DIAGNOSIS — I824Y9 Acute embolism and thrombosis of unspecified deep veins of unspecified proximal lower extremity: Secondary | ICD-10-CM

## 2023-07-09 DIAGNOSIS — Z79899 Other long term (current) drug therapy: Secondary | ICD-10-CM

## 2023-07-09 LAB — CBC WITH DIFFERENTIAL/PLATELET
Abs Immature Granulocytes: 0 10*3/uL (ref 0.00–0.07)
Basophils Absolute: 0 10*3/uL (ref 0.0–0.1)
Basophils Relative: 0 %
Eosinophils Absolute: 0 10*3/uL (ref 0.0–0.5)
Eosinophils Relative: 0 %
HCT: 34.3 % — ABNORMAL LOW (ref 39.0–52.0)
Hemoglobin: 10.8 g/dL — ABNORMAL LOW (ref 13.0–17.0)
Lymphocytes Relative: 15 %
Lymphs Abs: 3 10*3/uL (ref 0.7–4.0)
MCH: 30 pg (ref 26.0–34.0)
MCHC: 31.5 g/dL (ref 30.0–36.0)
MCV: 95.3 fL (ref 80.0–100.0)
Monocytes Absolute: 2 10*3/uL — ABNORMAL HIGH (ref 0.1–1.0)
Monocytes Relative: 10 %
Neutro Abs: 15 10*3/uL — ABNORMAL HIGH (ref 1.7–7.7)
Neutrophils Relative %: 75 %
Platelets: 302 10*3/uL (ref 150–400)
RBC: 3.6 MIL/uL — ABNORMAL LOW (ref 4.22–5.81)
RDW: 17.7 % — ABNORMAL HIGH (ref 11.5–15.5)
WBC: 20 10*3/uL — ABNORMAL HIGH (ref 4.0–10.5)
nRBC: 0.7 % — ABNORMAL HIGH (ref 0.0–0.2)

## 2023-07-09 LAB — COMPREHENSIVE METABOLIC PANEL
ALT: 74 U/L — ABNORMAL HIGH (ref 0–44)
AST: 46 U/L — ABNORMAL HIGH (ref 15–41)
Albumin: 2.8 g/dL — ABNORMAL LOW (ref 3.5–5.0)
Alkaline Phosphatase: 42 U/L (ref 38–126)
Anion gap: 9 (ref 5–15)
BUN: 40 mg/dL — ABNORMAL HIGH (ref 8–23)
CO2: 20 mmol/L — ABNORMAL LOW (ref 22–32)
Calcium: 8.3 mg/dL — ABNORMAL LOW (ref 8.9–10.3)
Chloride: 107 mmol/L (ref 98–111)
Creatinine, Ser: 1.17 mg/dL (ref 0.61–1.24)
GFR, Estimated: >60 mL/min (ref >60)
Glucose, Bld: 156 mg/dL — ABNORMAL HIGH (ref 70–99)
Potassium: 3.8 mmol/L (ref 3.5–5.1)
Sodium: 136 mmol/L (ref 135–145)
Total Bilirubin: 0.3 mg/dL (ref 0.0–1.2)
Total Protein: 7.7 g/dL (ref 6.5–8.1)

## 2023-07-09 LAB — GLUCOSE, CAPILLARY
Glucose-Capillary: 139 mg/dL — ABNORMAL HIGH (ref 70–99)
Glucose-Capillary: 129 mg/dL — ABNORMAL HIGH (ref 70–99)
Glucose-Capillary: 121 mg/dL — ABNORMAL HIGH (ref 70–99)
Glucose-Capillary: 108 mg/dL — ABNORMAL HIGH (ref 70–99)

## 2023-07-09 MED ORDER — FLUTICASONE PROPIONATE 50 MCG/ACT NA SUSP: 2.0000 | Freq: Every day | NASAL | Status: DC | PRN

## 2023-07-09 MED ORDER — ACETAMINOPHEN 500 MG PO TABS: 500.0000 mg | ORAL_TABLET | ORAL | Status: DC | PRN

## 2023-07-09 MED ORDER — GUAIFENESIN-DM 100-10 MG/5ML PO SYRP: 5.0000 mL | ORAL_SOLUTION | Freq: Four times a day (QID) | ORAL | Status: DC | PRN

## 2023-07-09 MED ORDER — RIVAROXABAN 20 MG PO TABS
20.0000 mg | ORAL_TABLET | Freq: Every day | ORAL | Status: DC
  Administered 2023-07-09 – 2023-07-21 (×13): 20 mg via ORAL
  Filled 2023-07-09 (×13): qty 1

## 2023-07-09 MED ORDER — PROCHLORPERAZINE EDISYLATE 10 MG/2ML IJ SOLN: 5.0000 mg | Freq: Four times a day (QID) | INTRAMUSCULAR | Status: DC | PRN

## 2023-07-09 MED ORDER — MELATONIN 5 MG PO TABS
5.0000 mg | ORAL_TABLET | Freq: Every evening | ORAL | Status: DC | PRN
  Administered 2023-07-10 – 2023-07-20 (×2): 5 mg via ORAL
  Filled 2023-07-09 (×2): qty 1

## 2023-07-09 MED ORDER — PROCHLORPERAZINE MALEATE 5 MG PO TABS: 5.0000 mg | ORAL_TABLET | Freq: Four times a day (QID) | ORAL | Status: DC | PRN

## 2023-07-09 MED ORDER — GABAPENTIN 100 MG PO CAPS
100.0000 mg | ORAL_CAPSULE | Freq: Every day | ORAL | Status: DC
  Administered 2023-07-09 – 2023-07-17 (×9): 100 mg via ORAL
  Filled 2023-07-09 (×9): qty 1

## 2023-07-09 MED ORDER — ALUM & MAG HYDROXIDE-SIMETH 200-200-20 MG/5ML PO SUSP: 30.0000 mL | ORAL | Status: DC | PRN

## 2023-07-09 MED ORDER — BUPROPION HCL ER (XL) 150 MG PO TB24
150.0000 mg | ORAL_TABLET | Freq: Every morning | ORAL | Status: DC
Start: 2023-07-10 — End: 2023-07-22
  Administered 2023-07-10 – 2023-07-22 (×13): 150 mg via ORAL
  Filled 2023-07-09 (×13): qty 1

## 2023-07-09 MED ORDER — AMLODIPINE BESYLATE 10 MG PO TABS
10.0000 mg | ORAL_TABLET | Freq: Every day | ORAL | Status: DC
  Administered 2023-07-09 – 2023-07-21 (×13): 10 mg via ORAL
  Filled 2023-07-09 (×13): qty 1

## 2023-07-09 MED ORDER — ASPIRIN 81 MG PO CHEW
81.0000 mg | CHEWABLE_TABLET | Freq: Every day | ORAL | Status: DC
Start: 2023-07-10 — End: 2023-07-22
  Administered 2023-07-10 – 2023-07-22 (×13): 81 mg via ORAL
  Filled 2023-07-09 (×13): qty 1

## 2023-07-09 MED ORDER — LIDOCAINE HCL URETHRAL/MUCOSAL 2 % EX GEL: CUTANEOUS | Status: DC | PRN

## 2023-07-09 MED ORDER — NYSTATIN 100000 UNIT/ML MT SUSP: 5.0000 mL | Freq: Four times a day (QID) | OROMUCOSAL | Status: DC

## 2023-07-09 MED ORDER — BISACODYL 10 MG RE SUPP: 10.0000 mg | Freq: Every day | RECTAL | Status: DC | PRN

## 2023-07-09 MED ORDER — SERTRALINE HCL 100 MG PO TABS
200.0000 mg | ORAL_TABLET | Freq: Every morning | ORAL | Status: DC
  Administered 2023-07-10 – 2023-07-21 (×12): 200 mg via ORAL
  Filled 2023-07-09 (×13): qty 2

## 2023-07-09 MED ORDER — FLEET ENEMA RE ENEM: 1.0000 | ENEMA | Freq: Once | RECTAL | Status: DC | PRN

## 2023-07-09 MED ORDER — LEVETIRACETAM 750 MG PO TABS
1500.0000 mg | ORAL_TABLET | Freq: Two times a day (BID) | ORAL | Status: DC
  Administered 2023-07-09 – 2023-07-22 (×26): 1500 mg via ORAL
  Filled 2023-07-09 (×27): qty 2

## 2023-07-09 MED ORDER — ASPIRIN 81 MG PO CHEW
81.0000 mg | CHEWABLE_TABLET | Freq: Every day | ORAL | Status: DC
  Administered 2023-07-09: 81 mg via ORAL
  Filled 2023-07-09: qty 1

## 2023-07-09 MED ORDER — BACLOFEN 10 MG PO TABS
10.0000 mg | ORAL_TABLET | Freq: Two times a day (BID) | ORAL | Status: DC
  Administered 2023-07-09 – 2023-07-22 (×26): 10 mg via ORAL
  Filled 2023-07-09 (×26): qty 1

## 2023-07-09 MED ORDER — VITAMIN D 25 MCG (1000 UNIT) PO TABS
2000.0000 [IU] | ORAL_TABLET | Freq: Every day | ORAL | Status: DC
  Administered 2023-07-09 – 2023-07-10 (×2): 2000 [IU] via ORAL
  Filled 2023-07-09 (×2): qty 2

## 2023-07-09 MED ORDER — HYDROCODONE-ACETAMINOPHEN 10-325 MG PO TABS: 1.0000 | ORAL_TABLET | Freq: Every day | ORAL | Status: DC | PRN

## 2023-07-09 MED ORDER — PANTOPRAZOLE SODIUM 40 MG PO TBEC
40.0000 mg | DELAYED_RELEASE_TABLET | Freq: Every day | ORAL | Status: DC
  Administered 2023-07-10 – 2023-07-22 (×13): 40 mg via ORAL
  Filled 2023-07-09 (×13): qty 1

## 2023-07-09 MED ORDER — INSULIN ASPART 100 UNIT/ML IJ SOLN: 0.0000 [IU] | Freq: Three times a day (TID) | INTRAMUSCULAR | Status: DC

## 2023-07-09 MED ORDER — PROCHLORPERAZINE 25 MG RE SUPP: 12.5000 mg | Freq: Four times a day (QID) | RECTAL | Status: DC | PRN

## 2023-07-09 MED ORDER — NYSTATIN 100000 UNIT/ML MT SUSP
5.0000 mL | Freq: Four times a day (QID) | OROMUCOSAL | Status: DC
  Administered 2023-07-09 – 2023-07-22 (×48): 500000 [IU] via ORAL
  Filled 2023-07-09 (×48): qty 5

## 2023-07-09 MED ORDER — ACETAMINOPHEN 500 MG PO TABS
500.0000 mg | ORAL_TABLET | Freq: Every day | ORAL | Status: DC
  Administered 2023-07-09: 500 mg via ORAL
  Filled 2023-07-09: qty 1

## 2023-07-09 MED ORDER — GABAPENTIN 100 MG PO CAPS: 100.0000 mg | ORAL_CAPSULE | Freq: Every day | ORAL | Status: DC

## 2023-07-09 MED ORDER — HYDROCODONE-ACETAMINOPHEN 10-325 MG PO TABS
1.0000 | ORAL_TABLET | Freq: Every day | ORAL | Status: DC
  Administered 2023-07-09: 1 via ORAL
  Filled 2023-07-09: qty 1

## 2023-07-09 MED ORDER — POLYVINYL ALCOHOL 1.4 % OP SOLN: 1.0000 [drp] | Freq: Two times a day (BID) | OPHTHALMIC | Status: DC | PRN

## 2023-07-09 MED FILL — Immune Globulin (Human) IV Soln 40 GM/400ML: INTRAVENOUS | Qty: 800 | Status: AC

## 2023-07-09 NOTE — Progress Notes (Addendum)
 Inpatient Rehabilitation Admissions Coordinator   I have insurance approval and CIR bed to admit him to today. I contacted his wife by phone and she is in agreement. She does want to be at [place] before transport. I will let her know when transport likely once set up. Acute team and TOC made aware. Dr Natale Lay will be admitting MD at [facility]. I await Dr Rene Kocher approval to admit to CIR today.  Ottie Glazier, RN, MSN Rehab Admissions Coordinator [phone] 07/09/2023 9:55 AM   [facility] link has been contacted for pickup to room [address] 07.  Ottie Glazier, RN, MSN Rehab Admissions Coordinator [phone] 07/09/2023 12:34 PM

## 2023-07-09 NOTE — Hospital Course (Addendum)
 78 year old with past medical history significant for ITP, lupus anticoagulant, coronary artery disease, hypertension, stroke with residual left-sided hemiparesis, hyperlipidemia, CKD stage IIIa, GERD, history of seizure, recently admitted to the hospital and treated with 4 days of high-dose Decadron and discharged home with improvement in platelet counts.  He saw Dr. Myna Hidalgo at the clinic 3/21 and was found to have platelet counts again of 17, admitted for further treatment.  Received Nplate, IVIG for 3 days and treated with IV Decadron with resolution of thrombocytopenia.  CIR recommended by therapy.  Consultants Hematology   Procedures/Events 3/22 transfusion 1 unit platelets

## 2023-07-09 NOTE — H&P (Signed)
 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 Myna Hidalgo on 07/04/23 on follow up and reported intermittent epistaxis and found to have drop in platelets to 17. He was admitted to [facility] the same day and started on high dose decadron X 4days, IVIG X 2 days and nplate per input from Dr. Myna Hidalgo. Did require nasal packing for nose bleed which was covered with Augmentin and has resolved. Xarelto placed on hold briefly and resumed yesterday. Elevated BS managed with SSI. Platelets have improved to 302 and rise in WBC to 20 felt to be reactive in nature.  Retuxin considered outpatient.  Pt reports he had shingles pain R upper back and in his hand. This has improved now 1/10 intensity.   ST consulted for evaluation of swallow and recommended aspiration precautions and chin tuck with liquids due to hx of penetration/tendency to "get strangled". PT/OT consulted and patient was noted to be debilitated. He requires min to max assist with ADLS and noted to have significant balance deficits with mobility due to weakness. Marland Kitchen PTA was independent with use of HW and occasionally needed some assistance by his wife. CIR recommended due to functional decline.    Review of Systems  Constitutional:  Negative for chills and fever.  HENT:  Positive for hearing loss. Negative for congestion.        Chronic dysphagia-does chin tuck  Eyes:  Negative for double vision.       Wears reading glasses  Respiratory:  Negative for sputum production and shortness of breath.   Cardiovascular:   Negative for palpitations.       Chest wall soreness  Gastrointestinal:  Positive for heartburn. Negative for abdominal pain, constipation, diarrhea, nausea and vomiting.  Genitourinary:        Chronic IC for retention  Musculoskeletal:  Positive for back pain.  Skin:  Positive for rash.  Neurological:  Positive for sensory change (shingles (Feb/hospitalized) pain in right back/finger better), focal weakness and weakness. Negative for dizziness and headaches.  Psychiatric/Behavioral:  Negative for depression.      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    Lupus anticoagulant disorder (HCC)    Lupus anticoagulant syndrome (HCC)    Multiple closed anterior-posterior compression fractures of pelvis (HCC)    Seizures (HCC)    last seizure 10 years ago approx    Sleep apnea    cpap broken x 1 year per wife    Stroke (HCC) 02/27/2011   Left side weakness    Past Surgical History:  Procedure Laterality Date   blood clot Right  surgical removal   CHOLECYSTECTOMY     CORONARY ANGIOPLASTY  1992   CYSTOGRAM N/A 02/17/2020   Procedure: CYSTOGRAM;  Surgeon: Crist Fat, MD;  Location: [facility];  Service: Urology;  Laterality: N/A;   CYSTOSCOPY WITH RETROGRADE PYELOGRAM, URETEROSCOPY AND STENT PLACEMENT Bilateral 02/17/2020   Procedure: Effie Shy  URETEROSCOPY AND STENT PLACEMENT;  Surgeon: Crist Fat, MD;  Location: [facility];  Service: Urology;  Laterality: Bilateral;   SPLENECTOMY, TOTAL     vertebralplasty      History reviewed. No pertinent family history.   Social History:  reports that he  quit smoking about 38 years ago. His smoking use included cigarettes. He started smoking about 55 years ago. He has a 16.6 pack-year smoking history. He has never used smokeless tobacco. He reports that he does not currently use drugs. He reports that he does not drink alcohol.   Allergies  Allergen Reactions   Diphenhydramine Other (See Comments)    Affects kidneys   Famotidine Other (See Comments)    Affects kidneys   Other Other (See Comments)    Patient is a VEGETARIAN  Patient is a diabetic.   Beef-Derived Drug Products    Chicken Protein    Pork-Derived Products     Medications Prior to Admission  Medication Sig Dispense Refill   amLODipine (NORVASC) 10 MG tablet Take 10 mg by mouth at bedtime.     aspirin EC 81 MG tablet Take 81 mg by mouth daily. Swallow whole.     baclofen (LIORESAL) 10 MG tablet Take 10 mg by mouth in the morning and at bedtime.     buPROPion (WELLBUTRIN XL) 150 MG 24 hr tablet Take 150 mg by mouth in the morning.     Cholecalciferol (VITAMIN D3) 50 MCG (2000 UT) TABS Take 2,000 Units by mouth at bedtime.     fluticasone (FLONASE) 50 MCG/ACT nasal spray Place 2 sprays into both nostrils daily as needed for allergies or rhinitis.     gabapentin (NEURONTIN) 100 MG capsule Take 1 capsule (100 mg total) by mouth at bedtime.     HYDROcodone-acetaminophen (NORCO) 10-325 MG tablet Take 1 tablet by mouth See admin instructions. Take 1 by mouth at bedtime and an additional 1 tablet once a day as needed for pain     levETIRAcetam (KEPPRA) 500 MG tablet Take 1,500 mg by mouth in the morning and at bedtime.     nystatin (MYCOSTATIN) 100000 UNIT/ML suspension Take 5 mLs (500,000 Units total) by mouth 4 (four) times daily.     omeprazole (PRILOSEC) 40 MG capsule Take 40 mg by mouth daily before breakfast.     polyethylene glycol (MIRALAX / GLYCOLAX) packet Take 17 g by mouth daily as needed. (Patient taking differently: Take 17 g by mouth daily as needed for mild  constipation.) 14 each 0   rosuvastatin (CRESTOR) 40 MG tablet Take 40 mg by mouth at bedtime.     sertraline (ZOLOFT) 100 MG tablet Take 200 mg by mouth in the morning.      THERATEARS 0.25 % SOLN Place 1 drop into both eyes 2 (two) times daily as needed (for redness or irritation).     TYLENOL 500 MG tablet Take 500 mg by mouth See admin instructions. Take 500 mg by mouth at bedtime and an additional 500 mg once a day as needed for pain or headaches     XARELTO 20 MG TABS tablet Take 1 tablet by mouth once daily with breakfast (Patient  taking differently: Take 20 mg by mouth daily with breakfast.) 30 tablet 0     Home: Home Living Family/patient expects to be discharged to:: Skilled nursing facility Living Arrangements: Spouse/significant other Available Help at Discharge: Family, Available 24 hours/day Type of Home: House Home Access: Ramped entrance Entrance Stairs-Number of Steps: 4 to porch then 1 step up into house Entrance Stairs-Rails: Can reach both Home Layout: One level Bathroom Shower/Tub: Engineer, manufacturing systems: Handicapped height Bathroom Accessibility: Yes Home Equipment: Shower seat, BSC/3in1, Cane - single point, Grab bars - toilet, Hand held shower head, Grab bars - tub/shower, Adaptive equipment, Wheelchair - manual, Transport chair Additional Comments: railings on bed  Lives With: Spouse   Functional History: Prior Function Prior Level of Function : Needs assist Mobility (Cognitive): Intermittent cues ADLs (Cognitive): Intermittent cues Physical Assist : Mobility (physical), ADLs (physical) Mobility (physical): Transfers, Gait, Stairs ADLs (physical): Bathing, IADLs Mobility Comments: wife utilizes gait belt as needed, pt amb with hemi walker in the home, active with HHPT, spouse reports doesn't go out much because stairs are difficult ADLs Comments: wife assists with bathing, active wtih HHOT and PT   Functional Status:  Mobility: Bed  Mobility Overal bed mobility: Needs Assistance Bed Mobility: Sit to Supine Rolling: Mod assist Supine to sit: HOB elevated, Used rails, Supervision Sit to supine: Min assist General bed mobility comments: to get BLE back into bed Transfers Overall transfer level: Needs assistance Equipment used: Hemi-walker Transfers: Sit to/from Stand Sit to Stand: Mod assist, +2 physical assistance, +2 safety/equipment Bed to/from chair/wheelchair/BSC transfer type:: Stand pivot General transfer comment: assist to rise and stabilize, pt had loss of balance requiring +2 mod assist 2* scissoring; VCs hand placement for stand to sit, uncontrolled descent Ambulation/Gait Ambulation/Gait assistance: Min assist, +2 safety/equipment Gait Distance (Feet): 35 Feet Assistive device: Hemi-walker Gait Pattern/deviations: Step-to pattern, Decreased weight shift to left, Decreased dorsiflexion - left, Narrow base of support General Gait Details: significant lean of trunk to right posterior (weight over hemiwalker) and likely to compensate to advance L LE; min assist due to weakness and balance; followed with recliner as pt fatigued and became unsteady quickly; +2 with pt for safety Gait velocity: decreased   ADL: ADL Overall ADL's : Needs assistance/impaired Eating/Feeding: Set up Eating/Feeding Details (indicate cue type and reason): one handed techniques Grooming: Wash/dry hands, Wash/dry face, Sitting, Brushing hair, Set up Grooming Details (indicate cue type and reason): declined oral hygiene at this time Upper Body Bathing: Minimal assistance, Sitting Upper Body Bathing Details (indicate cue type and reason): in recliner Lower Body Bathing: Sit to/from stand, Sitting/lateral leans, Cueing for sequencing, +2 for physical assistance, +2 for safety/equipment, Moderate assistance Upper Body Dressing : Minimal assistance, Sitting Upper Body Dressing Details (indicate cue type and reason): using hemi  techniques Lower Body Dressing: Maximal assistance Lower Body Dressing Details (indicate cue type and reason): attempted to help with one handed texhniques in standing to pull up adult absorbent undergarments. Toilet Transfer: Moderate assistance, +2 for physical assistance, +2 for safety/equipment, Cueing for safety, Cueing for sequencing, Stand-pivot Toilet Transfer Details (indicate cue type and reason): cues for hand positioning Toileting- Clothing Manipulation and Hygiene: Maximal assistance, Sitting/lateral lean, Sit to/from stand Toileting - Clothing Manipulation Details (indicate cue type and reason): patient was able to lean sitting on commode to complete part of hygiene tasks with mod A to complete task in standing with patient needing one UE support to maintain balance. General ADL Comments: Spoke with pt and daugther.  Wife is struggling to care for pt at this time.  Pt needing increased A with ADL activity in which wife cant provide.  Daugther feels strongly pt needs rehab and OT also agrees.  Pt at a high fall risk and needs to be stonger to return home.   Cognition: Cognition Orientation Level: Oriented X4 Cognition Arousal: Alert Behavior During Therapy: WFL for tasks assessed/performed    Physical Exam: Blood pressure 136/81, pulse 70, temperature 99.1 F (37.3 C), temperature source Oral, resp. rate 18, height 5\' 11"  (1.803 m), weight 86.6 kg, SpO2 97%. Physical Exam  General: No apparent distress, wife at bedside HEENT: Head is normocephalic, atraumatic, sclera anicteric, oral mucosa pink and moist, partial dentures Neck: Supple without JVD or lymphadenopathy Heart: Reg rate and rhythm. No murmurs rubs or gallops Chest: CTA bilaterally without wheezes, rales, or rhonchi; no distress Abdomen: Soft, non-tender, non-distended, bowel sounds positive. Extremities: No clubbing, cyanosis, or edema. Pulses are 2+ Psych: Pt's affect is appropriate. Pt is cooperative Skin:  areas of bruising noted, scarring from shingles R upper back Neuro:     Mental Status: AAOx3, long term memory deficits Speech/Languate: Naming and repetition intact, fluent, follows simple commands CRANIAL NERVES: 2-12 grossly intact     MOTOR: RUE: 5/5 Deltoid, 5/5 Biceps, 5/5 Triceps,5/5 Grip LUE: 0/5 Deltoid, 0/5 Biceps, 1-2/5 Triceps, 1/5 Grip RLE: HF 5/5, KE 5/5, ADF 5/5, APF 5/5 LLE: HF 4-/5, KE 4-/5, ADF 2-/5, APF 4-/5   SENSORY: Altered to LT Lue and LLE   MSK: Increased tone LUE with hand in fist and increased elbow extensor tone Decreased dorsiflexion ROM LLE  Atrophy R hand intrinsic muscles     Results for orders placed or performed during the hospital encounter of 07/04/23 (from the past 48 hours)  Glucose, capillary     Status: Abnormal   Collection Time: 07/07/23  4:57 PM  Result Value Ref Range   Glucose-Capillary 162 (H) 70 - 99 mg/dL    Comment: Glucose reference range applies only to samples taken after fasting for at least 8 hours.  Glucose, capillary     Status: Abnormal   Collection Time: 07/07/23  9:20 PM  Result Value Ref Range   Glucose-Capillary 246 (H) 70 - 99 mg/dL    Comment: Glucose reference range applies only to samples taken after fasting for at least 8 hours.  CBC with Differential/Platelet     Status: Abnormal   Collection Time: 07/08/23  4:50 AM  Result Value Ref Range   WBC 18.4 (H) 4.0 - 10.5 K/uL   RBC 3.49 (L) 4.22 - 5.81 MIL/uL   Hemoglobin 10.5 (L) 13.0 - 17.0 g/dL   HCT 51.8 (L) 84.1 - 66.0 %   MCV 95.4 80.0 - 100.0 fL   MCH 30.1 26.0 - 34.0 pg   MCHC 31.5 30.0 - 36.0 g/dL   RDW 63.0 (H) 16.0 - 10.9 %   Platelets 180 150 - 400 K/uL    Comment: SPECIMEN CHECKED FOR CLOTS REPEATED TO VERIFY DELTA CHECK NOTED PLATELET COUNT CONFIRMED BY SMEAR    nRBC 0.3 (H) 0.0 - 0.2 %   Neutrophils Relative % 85 %   Neutro Abs 15.4 (H) 1.7 - 7.7 K/uL   Lymphocytes Relative 7 %   Lymphs Abs 1.3 0.7 - 4.0 K/uL   Monocytes Relative 6 %    Monocytes Absolute 1.1 (H) 0.1 - 1.0 K/uL   Eosinophils Relative 0 %   Eosinophils Absolute 0.0 0.0 - 0.5 K/uL  Basophils Relative 0 %   Basophils Absolute 0.1 0.0 - 0.1 K/uL   Immature Granulocytes 2 %   Abs Immature Granulocytes 0.43 (H) 0.00 - 0.07 K/uL    Comment: Performed at [facility], [address] [place], [place] [address]  Comprehensive metabolic panel     Status: Abnormal   Collection Time: 07/08/23  4:50 AM  Result Value Ref Range   Sodium 133 (L) 135 - 145 mmol/L   Potassium 3.8 3.5 - 5.1 mmol/L   Chloride 109 98 - 111 mmol/L   CO2 20 (L) 22 - 32 mmol/L   Glucose, Bld 238 (H) 70 - 99 mg/dL    Comment: Glucose reference range applies only to samples taken after fasting for at least 8 hours.   BUN 43 (H) 8 - 23 mg/dL   Creatinine, Ser 6.04 (H) 0.61 - 1.24 mg/dL   Calcium 8.3 (L) 8.9 - 10.3 mg/dL   Total Protein 8.5 (H) 6.5 - 8.1 g/dL   Albumin 2.9 (L) 3.5 - 5.0 g/dL   AST 61 (H) 15 - 41 U/L   ALT 76 (H) 0 - 44 U/L   Alkaline Phosphatase 45 38 - 126 U/L   Total Bilirubin 0.6 0.0 - 1.2 mg/dL   GFR, Estimated 52 (L) >60 mL/min    Comment: (NOTE) Calculated using the CKD-EPI Creatinine Equation (2021)    Anion gap 4 (L) 5 - 15    Comment: Performed at [facility], [address], [place], [place] [address]  Glucose, capillary     Status: Abnormal   Collection Time: 07/08/23  7:26 AM  Result Value Ref Range   Glucose-Capillary 210 (H) 70 - 99 mg/dL    Comment: Glucose reference range applies only to samples taken after fasting for at least 8 hours.  Glucose, capillary     Status: Abnormal   Collection Time: 07/08/23 11:39 AM  Result Value Ref Range   Glucose-Capillary 186 (H) 70 - 99 mg/dL    Comment: Glucose reference range applies only to samples taken after fasting for at least 8 hours.  Glucose, capillary     Status: Abnormal   Collection Time: 07/08/23  4:11 PM  Result Value Ref Range   Glucose-Capillary 187 (H) 70 -  99 mg/dL    Comment: Glucose reference range applies only to samples taken after fasting for at least 8 hours.  Glucose, capillary     Status: Abnormal   Collection Time: 07/08/23  9:15 PM  Result Value Ref Range   Glucose-Capillary 207 (H) 70 - 99 mg/dL    Comment: Glucose reference range applies only to samples taken after fasting for at least 8 hours.  CBC with Differential/Platelet     Status: Abnormal   Collection Time: 07/09/23  5:09 AM  Result Value Ref Range   WBC 20.0 (H) 4.0 - 10.5 K/uL   RBC 3.60 (L) 4.22 - 5.81 MIL/uL   Hemoglobin 10.8 (L) 13.0 - 17.0 g/dL   HCT 11.9 (L) 14.7 - 82.9 %   MCV 95.3 80.0 - 100.0 fL   MCH 30.0 26.0 - 34.0 pg   MCHC 31.5 30.0 - 36.0 g/dL   RDW 56.2 (H) 13.0 - 86.5 %   Platelets 302 150 - 400 K/uL   nRBC 0.7 (H) 0.0 - 0.2 %   Neutrophils Relative % 75 %   Neutro Abs 15.0 (H) 1.7 - 7.7 K/uL   Lymphocytes Relative 15 %   Lymphs Abs 3.0  0.7 - 4.0 K/uL   Monocytes Relative 10 %   Monocytes Absolute 2.0 (H) 0.1 - 1.0 K/uL   Eosinophils Relative 0 %   Eosinophils Absolute 0.0 0.0 - 0.5 K/uL   Basophils Relative 0 %   Basophils Absolute 0.0 0.0 - 0.1 K/uL   Abs Immature Granulocytes 0.00 0.00 - 0.07 K/uL    Comment: Performed at [facility], [address] [place], [place] [address]  Comprehensive metabolic panel     Status: Abnormal   Collection Time: 07/09/23  5:09 AM  Result Value Ref Range   Sodium 136 135 - 145 mmol/L   Potassium 3.8 3.5 - 5.1 mmol/L   Chloride 107 98 - 111 mmol/L   CO2 20 (L) 22 - 32 mmol/L   Glucose, Bld 156 (H) 70 - 99 mg/dL    Comment: Glucose reference range applies only to samples taken after fasting for at least 8 hours.   BUN 40 (H) 8 - 23 mg/dL   Creatinine, Ser 6.04 0.61 - 1.24 mg/dL   Calcium 8.3 (L) 8.9 - 10.3 mg/dL   Total Protein 7.7 6.5 - 8.1 g/dL   Albumin 2.8 (L) 3.5 - 5.0 g/dL   AST 46 (H) 15 - 41 U/L   ALT 74 (H) 0 - 44 U/L   Alkaline Phosphatase 42 38 - 126 U/L   Total  Bilirubin 0.3 0.0 - 1.2 mg/dL   GFR, Estimated >54 >09 mL/min    Comment: (NOTE) Calculated using the CKD-EPI Creatinine Equation (2021)    Anion gap 9 5 - 15    Comment: Performed at [facility], [address] [place], [place] [address]  Glucose, capillary     Status: Abnormal   Collection Time: 07/09/23  7:41 AM  Result Value Ref Range   Glucose-Capillary 139 (H) 70 - 99 mg/dL    Comment: Glucose reference range applies only to samples taken after fasting for at least 8 hours.  Glucose, capillary     Status: Abnormal   Collection Time: 07/09/23 11:33 AM  Result Value Ref Range   Glucose-Capillary 129 (H) 70 - 99 mg/dL    Comment: Glucose reference range applies only to samples taken after fasting for at least 8 hours.   *Note: Due to a large number of results and/or encounters for the requested time period, some results have not been displayed. A complete set of results can be found in Results Review.   No results found.    Blood pressure 136/81, pulse 70, temperature 99.1 F (37.3 C), temperature source Oral, resp. rate 18, height 5\' 11"  (1.803 m), weight 86.6 kg, SpO2 97%.  Medical Problem List and Plan: 1. Functional deficits secondary to debility due to ITP  -patient may  shower  -ELOS/Goals: 7-10 days, Sup to Min a PT/OT/SLP  -Admit to CIR 2.  Antithrombotics: -DVT/anticoagulation:  Pharmaceutical: Xarelto  -antiplatelet therapy: ASA 3. Pain Management: Tylenol prn. Continue gabapentin. Continue Norco PRN.  4. Mood/Behavior/Sleep: LCSW to follow for evaluation and support.   -antipsychotic agents: N/A 5. Neuropsych/cognition: This patient is capable of making decisions on his own behalf. 6. Skin/Wound Care: Routine pressure relief measures.  7. Fluids/Electrolytes/Nutrition: Strict I/O. Daily CMET per hem/Onc 8.  Chronic ITP w/relapse: Has been received Decadron, IVIG and Nplate  --plans for rituxan on outpatient basis per Dr.  Myna Hidalgo  --continue daily CMET/CBC X 4 days per input from Dr. Myna Hidalgo.  9. Epistaxis: Nasal packing--augmentin X 3 d/c on 03/25. 10 .  Prediabetes: Hgb A1C- 6.2. Elevated due to steriods but trending down. --Continue to monitor BS ac/hs and use SSI for elevated BS 11. H/o CVA X 2 w/neurogenic bladder: On Xarelto and ASA.  --At baseline completes intermittent self cath several times a day.  12.Seizure d/o: Seizure free on Keppra 1500 mg bid 13. Anxiety/depression: On Buropion and Sertraline 14. CKD 3A: BUN/SCr 24/1.3--> Baseline SCr 1.3-->40/1.17. Avoid nephrotoxic medications.  15. Abnormal LFTs: Improving. Recheck in am.  16. Leucocytosis: May be steroid related. WBC trending up to 20-->monitor for fevers and other signs of infection.  17. Essential HTN. Monitor BP. Continue norvasc 18. Spasticity. Continue baclofen  Jacquelynn Cree, PA-C 07/09/2023  I have personally performed a face to face diagnostic evaluation of this patient and formulated the key components of the plan.  Additionally, I have personally reviewed laboratory data, imaging studies, as well as relevant notes and concur with the physician assistant's documentation above.  The patient's status has not changed from the original H&P.  Any changes in documentation from the acute care chart have been noted above.  Fanny Dance

## 2023-07-09 NOTE — Discharge Summary (Signed)
 Physician Discharge Summary   Patient: Mark [name] MRN: [id number] DOB: 1945/08/10  Admit date:     07/04/2023  Discharge date: 07/09/23  Discharge Physician: Brendia Sacks   PCP: Laurann Montana, MD   Recommendations at discharge:   Acute ITP with relapse Follow his platelet count every couple days while at CIR and confer with Dr. Myna Hidalgo as needed.  Discharge Diagnoses: Principal Problem:   Acute ITP (HCC) Active Problems:   Ischemic stroke (HCC)   Hemiparesis, left (HCC)   CKD (chronic kidney disease), stage III (HCC)   Prediabetes  Resolved Problems:   * No resolved hospital problems. *  Hospital Course: 78 year old with past medical history significant for ITP, lupus anticoagulant, coronary artery disease, hypertension, stroke with residual left-sided hemiparesis, hyperlipidemia, CKD stage IIIa, GERD, history of seizure, recently admitted to the hospital and treated with 4 days of high-dose Decadron and discharged home with improvement in platelet counts.  He saw Dr. Myna Hidalgo at the clinic 3/21 and was found to have platelet counts again of 17, admitted for further treatment.  Received Nplate, IVIG for 3 days and treated with IV Decadron with resolution of thrombocytopenia.  CIR recommended by therapy.  Consultants Hematology   Procedures/Events 3/22 transfusion 1 unit platelets  Acute ITP with relapse: Admitted for recurrent thrombocytopenia.  Seen by Dr. Myna Hidalgo and treated with Nplate, Decadron and IVIG with resolution.  Rituxan being considered as an outpatient.  Cleared for discharge by Dr. Myna Hidalgo. Follow his platelet count every couple days while at CIR and confer with Dr. Myna Hidalgo as needed.   Epistaxis in the setting of ITP Resolved.   History of ischemic stroke with left Hemiparesis, lupus anticoagulant positive, history of DVT Xarelto and aspirin held earlier in hospitalization but resumed as per Dr. Myna Hidalgo given resolution of thrombocytopenia.    Essential hypertension   CKD 3A Cr baseline 1.3   History of seizure  Continue with Keppra twice daily   Anxiety, Depression  Continue bupropion and sertraline   Pre-diabetes Steroid-induced hyperglycemia Diet modification, follow-up as an outpatient.   Elevated transaminases Modest in nature.  Can follow-up as an outpatient.  Disposition: CIR Diet recommendation:  Regular diet DISCHARGE MEDICATION: Allergies as of 07/09/2023       Reactions   Diphenhydramine Other (See Comments)   Affects kidneys   Famotidine Other (See Comments)   Affects kidneys   Other Other (See Comments)   Patient is a VEGETARIAN Patient is a diabetic.   Beef-derived Drug Products    Chicken Protein    Pork-derived Products         Medication List     STOP taking these medications    dexamethasone 4 MG tablet Commonly known as: DECADRON       TAKE these medications    amLODipine 10 MG tablet Commonly known as: NORVASC Take 10 mg by mouth at bedtime.   aspirin EC 81 MG tablet Take 81 mg by mouth daily. Swallow whole.   baclofen 10 MG tablet Commonly known as: LIORESAL Take 10 mg by mouth in the morning and at bedtime.   buPROPion 150 MG 24 hr tablet Commonly known as: WELLBUTRIN XL Take 150 mg by mouth in the morning.   fluticasone 50 MCG/ACT nasal spray Commonly known as: FLONASE Place 2 sprays into both nostrils daily as needed for allergies or rhinitis.   gabapentin 100 MG capsule Commonly known as: NEURONTIN Take 1 capsule (100 mg total) by mouth at bedtime.   HYDROcodone-acetaminophen 10-325 MG  tablet Commonly known as: NORCO Take 1 tablet by mouth See admin instructions. Take 1 by mouth at bedtime and an additional 1 tablet once a day as needed for pain   levETIRAcetam 500 MG tablet Commonly known as: KEPPRA Take 1,500 mg by mouth in the morning and at bedtime.   nystatin 100000 UNIT/ML suspension Commonly known as: MYCOSTATIN Take 5 mLs (500,000 Units  total) by mouth 4 (four) times daily.   omeprazole 40 MG capsule Commonly known as: PRILOSEC Take 40 mg by mouth daily before breakfast.   polyethylene glycol 17 g packet Commonly known as: MIRALAX / GLYCOLAX Take 17 g by mouth daily as needed. What changed: reasons to take this   rosuvastatin 40 MG tablet Commonly known as: CRESTOR Take 40 mg by mouth at bedtime.   sertraline 100 MG tablet Commonly known as: ZOLOFT Take 200 mg by mouth in the morning.   Theratears 0.25 % Soln Generic drug: Carboxymethylcellulose Sodium Place 1 drop into both eyes 2 (two) times daily as needed (for redness or irritation).   TYLENOL 500 MG tablet Generic drug: acetaminophen Take 500 mg by mouth See admin instructions. Take 500 mg by mouth at bedtime and an additional 500 mg once a day as needed for pain or headaches   Vitamin D3 50 MCG (2000 UT) Tabs Take 2,000 Units by mouth at bedtime.   Xarelto 20 MG Tabs tablet Generic drug: rivaroxaban Take 1 tablet by mouth once daily with breakfast What changed: how much to take        Follow-up Information     Josph Macho, MD. Schedule an appointment as soon as possible for a visit in 2 week(s).   Specialty: Oncology Contact information: [address] [place] [address] [phone]                Feels fine  Discharge Exam: Ceasar Mons Weights   07/04/23 1517 07/06/23 0100  Weight: 79.4 kg 86.6 kg   Physical Exam Vitals reviewed.  Constitutional:      General: He is not in acute distress.    Appearance: He is not ill-appearing or toxic-appearing.  Cardiovascular:     Rate and Rhythm: Normal rate and regular rhythm.     Heart sounds: No murmur heard. Pulmonary:     Effort: Pulmonary effort is normal. No respiratory distress.     Breath sounds: No wheezing, rhonchi or rales.  Neurological:     Mental Status: He is alert.  Psychiatric:        Mood and Affect: Mood normal.        Behavior: Behavior  normal.    CBC stable Creatinine stable 1.17 Mild elevation AST and ALT noted WBC up secondary to earlier therapeutics Hemoglobin stable 10.8 Platelets 302  Condition at discharge: good  The results of significant diagnostics from this hospitalization (including imaging, microbiology, ancillary and laboratory) are listed below for reference.   Imaging Studies: US Abdomen Limited Result Date: 06/11/2023 CLINICAL DATA:  Thrombocytopenia.  Splenectomy. EXAM: ULTRASOUND ABDOMEN LIMITED COMPARISON:  None Available. FINDINGS: Targeted sonographic images of the left upper quadrant performed. Status post prior splenectomy. No splenic tissue noted on the provided images. IMPRESSION: No splenic tissue. Electronically Signed   By: Elgie Collard M.D.   On: 06/11/2023 11:19    Microbiology: Results for orders placed or performed during the hospital encounter of 06/29/20  Urine culture     Status: Abnormal   Collection Time: 06/29/20 11:10 PM  Specimen: Urine, Catheterized  Result Value Ref Range Status   Specimen Description   Final    URINE, CATHETERIZED Performed at [facility], [address] [place], [place] [address]    Special Requests   Final    NONE Performed at [facility], [address] [place], [place] [address]    Culture (A)  Final    <10,000 COLONIES/mL INSIGNIFICANT GROWTH Performed at [facility], [address], [place], [place] [address]    Report Status 07/01/2020 FINAL  Final  Resp Panel by RT-PCR (Flu A&B, Covid) Nasopharyngeal Swab     Status: None   Collection Time: 06/30/20 12:22 AM   Specimen: Nasopharyngeal Swab; Nasopharyngeal(NP) swabs in vial transport medium  Result Value Ref Range Status   SARS Coronavirus 2 by RT PCR NEGATIVE NEGATIVE Final    Comment: (NOTE) SARS-CoV-2 target nucleic acids are NOT DETECTED.  The SARS-CoV-2 RNA is generally detectable in upper respiratory specimens during the acute phase of  infection. The lowest concentration of SARS-CoV-2 viral copies this assay can detect is 138 copies/mL. A negative result does not preclude SARS-Cov-2 infection and should not be used as the sole basis for treatment or other patient management decisions. A negative result may occur with  improper specimen collection/handling, submission of specimen other than nasopharyngeal swab, presence of viral mutation(s) within the areas targeted by this assay, and inadequate number of viral copies(<138 copies/mL). A negative result must be combined with clinical observations, patient history, and epidemiological information. The expected result is Negative.  Fact Sheet for Patients:  BloggerCourse.com  Fact Sheet for Healthcare Providers:  SeriousBroker.it  This test is no t yet approved or cleared by the [place] FDA and  has been authorized for detection and/or diagnosis of SARS-CoV-2 by FDA under an Emergency Use Authorization (EUA). This EUA will remain  in effect (meaning this test can be used) for the duration of the COVID-19 declaration under Section 564(b)(1) of the Act, 21 U.S.C.section 360bbb-3(b)(1), unless the authorization is terminated  or revoked sooner.       Influenza A by PCR NEGATIVE NEGATIVE Final   Influenza B by PCR NEGATIVE NEGATIVE Final    Comment: (NOTE) The Xpert Xpress SARS-CoV-2/FLU/RSV plus assay is intended as an aid in the diagnosis of influenza from Nasopharyngeal swab specimens and should not be used as a sole basis for treatment. Nasal washings and aspirates are unacceptable for Xpert Xpress SARS-CoV-2/FLU/RSV testing.  Fact Sheet for Patients: BloggerCourse.com  Fact Sheet for Healthcare Providers: SeriousBroker.it  This test is not yet approved or cleared by the [place] FDA and has been authorized for detection and/or diagnosis of SARS-CoV-2  by FDA under an Emergency Use Authorization (EUA). This EUA will remain in effect (meaning this test can be used) for the duration of the COVID-19 declaration under Section 564(b)(1) of the Act, 21 U.S.C. section 360bbb-3(b)(1), unless the authorization is terminated or revoked.  Performed at [facility], [address] [place], [place] [address]   Culture, blood (routine x 2)     Status: None   Collection Time: 06/30/20 12:24 AM   Specimen: BLOOD  Result Value Ref Range Status   Specimen Description   Final    BLOOD Blood Culture adequate volume Performed at [facility], [address] [place], [place] [address]    Special Requests   Final    BOTTLES DRAWN AEROBIC AND ANAEROBIC LEFT ANTECUBITAL  Performed at [facility], [address], [place], [place] [address]    Culture   Final    NO GROWTH 5 DAYS Performed at [facility], [address] [place], [place] [address]    Report Status 07/05/2020 FINAL  Final  Culture, blood (routine x 2)     Status: None   Collection Time: 06/30/20 12:43 AM   Specimen: BLOOD  Result Value Ref Range Status   Specimen Description   Final    BLOOD Blood Culture adequate volume Performed at [facility], [address] [place], [place] [address]    Special Requests   Final    BOTTLES DRAWN AEROBIC AND ANAEROBIC RIGHT ANTECUBITAL Performed at [facility], [address] [place], [place] [address]    Culture   Final    NO GROWTH 5 DAYS Performed at [facility], [address], [place], [place] [address]    Report Status 07/05/2020 FINAL  Final  SARS CORONAVIRUS 2 (TAT 6-24 HRS) Nasopharyngeal Nasopharyngeal Swab     Status: None   Collection Time: 07/03/20  3:45 PM   Specimen: Nasopharyngeal Swab  Result Value Ref Range Status   SARS Coronavirus 2 NEGATIVE NEGATIVE Final    Comment: (NOTE) SARS-CoV-2 target nucleic acids are NOT DETECTED.  The SARS-CoV-2 RNA is  generally detectable in upper and lower respiratory specimens during the acute phase of infection. Negative results do not preclude SARS-CoV-2 infection, do not rule out co-infections with other pathogens, and should not be used as the sole basis for treatment or other patient management decisions. Negative results must be combined with clinical observations, patient history, and epidemiological information. The expected result is Negative.  Fact Sheet for Patients: HairSlick.no  Fact Sheet for Healthcare Providers: quierodirigir.com  This test is not yet approved or cleared by the [place] FDA and  has been authorized for detection and/or diagnosis of SARS-CoV-2 by FDA under an Emergency Use Authorization (EUA). This EUA will remain  in effect (meaning this test can be used) for the duration of the COVID-19 declaration under Se ction 564(b)(1) of the Act, 21 U.S.C. section 360bbb-3(b)(1), unless the authorization is terminated or revoked sooner.  Performed at [facility], [address], [place], [place] [address]    *Note: Due to a large number of results and/or encounters for the requested time period, some results have not been displayed. A complete set of results can be found in Results Review.    Labs: CBC: Recent Labs  Lab 07/04/23 1327 07/05/23 0529 07/06/23 0515 07/07/23 0427 07/08/23 0450 07/09/23 0509  WBC 10.6* 8.9 17.7* 19.1* 18.4* 20.0*  NEUTROABS 5.3  --  15.1* 16.5* 15.4* 15.0*  HGB 12.9* 11.0* 10.6* 10.8* 10.5* 10.8*  HCT 39.7 33.6* 33.9* 34.9* 33.3* 34.3*  MCV 92.3 93.9 95.2 96.1 95.4 95.3  PLT 17* 14* 44* 86* 180 302   Basic Metabolic Panel: Recent Labs  Lab 07/04/23 1334 07/06/23 0515 07/07/23 0427 07/08/23 0450 07/09/23 0509  NA 140 132* 133* 133* 136  K 4.7 3.9 3.8 3.8 3.8  CL 105 103 105 109 107  CO2 27 23 20* 20* 20*  GLUCOSE 106* 258* 215* 238* 156*  BUN 24* 40* 50*  43* 40*  CREATININE 1.30* 1.37* 1.23 1.39* 1.17  CALCIUM 9.6 8.8* 8.6* 8.3* 8.3*   Liver Function Tests: Recent Labs  Lab 07/04/23 1334 07/06/23 0515 07/07/23 0427 07/08/23 0450 07/09/23 2841  AST 35 37 32 61* 46*  ALT 32 40 38 76* 74*  ALKPHOS 70 57 52 45 42  BILITOT 0.4 0.3 0.5 0.6 0.3  PROT 7.8 8.9* 9.4* 8.5* 7.7  ALBUMIN 4.3 3.2* 3.1* 2.9* 2.8*   CBG: Recent Labs  Lab 07/08/23 1139 07/08/23 1611 07/08/23 2115 07/09/23 0741 07/09/23 1133  GLUCAP 186* 187* 207* 139* 129*    Discharge time spent: greater than 30 minutes.  Signed: Brendia Sacks, MD [facility] 07/09/2023

## 2023-07-09 NOTE — Progress Notes (Signed)
 Inpatient Rehabilitation Admission Medication Review by a Pharmacist  A complete drug regimen review was completed for this patient to identify any potential clinically significant medication issues.  High Risk Drug Classes Is patient taking? Indication by Medication  Antipsychotic Yes Prochlorperazine prn N/V  Anticoagulant Yes Xarelto - DVT, CVA  Antibiotic No   Opioid Yes Norco  - pain  Antiplatelet No   Hypoglycemics/insulin Yes Insulin - hyperglycemia  Vasoactive Medication Yes Amlodipine - HTN  Chemotherapy No   Other Yes Baclofen - muscle spasms Bupropion - mood Gabapentin - pain Levetiracetam - seizure hx Nystatin - thrush Pantoprazole - reflux  Sertraline - mood Lidocaine jelly prn cath      Type of Medication Issue Identified Description of Issue Recommendation(s)  Drug Interaction(s) (clinically significant)     Duplicate Therapy     Allergy     No Medication Administration End Date     Incorrect Dose     Additional Drug Therapy Needed     Significant med changes from prior encounter (inform family/care partners about these prior to discharge).    Other       Clinically significant medication issues were identified that warrant physician communication and completion of prescribed/recommended actions by midnight of the next day:  No  Name of provider notified for urgent issues identified:   Provider Method of Notification:     Pharmacist comments: Added 7 day stop date for nystatin   Time spent performing this drug regimen review (minutes):  30 minutes  Thank you. Okey Regal, PharmD

## 2023-07-09 NOTE — Progress Notes (Signed)
  Inpatient Rehabilitation Admissions Coordinator   I await insurance determination for possible CIR admit.  Ottie Glazier, RN, MSN Rehab Admissions Coordinator [phone] 07/09/2023 8:39 AM

## 2023-07-09 NOTE — Progress Notes (Signed)
 Report called to [place], Charity fundraiser. Pt transported via carelink w all belongings in stable condition.

## 2023-07-09 NOTE — Progress Notes (Signed)
    Ranelle Oyster, MD  Physician Physical Medicine and Rehabilitation   Progress Notes    Signed   Date of Service: 07/08/2023  9:23 AM  Related encounter: ED to Hosp-Admission (Current) from 07/04/2023 in [facility] [place] [facility]   Signed      Show:Clear all [x] Written[x] Templated[] Copied  Added by: [x] Ranelle Oyster, MD  [] Hover for details Physical Medicine & Rehabilitation Consult Service   Pt discussed with rehab admissions coordinator. Chart has been reviewed. This is a 78 yo male with a history of, lupus anticoagulant, a right CVA (residual left hemiparesis) as well as a recent 4 day hospitalization for ITP who was admitted with acute relapse of ITP on 07/04/23. Treated with IV decadron and IVIG. Course complicated by recurrent epistaxis. Recent hospitalizations and medical course has left patient deconditioned. He was last up with therapy on 3/23 and transferred with mod assist +2 and walked 22' using hemi-walker with mod assist +2 as well. Pt lives with spouse in one level home with 4 steps to enter. PTA (until recently) pt used hemiwalker for gait with supervision and wife utilized a gait belt on occasion as needed.          Assessment: Debility related to recurrent ITP and associated medical sequelae Hx of left hemiparesis after CVA     Plan:   This patient would benefit from acute inpatient rehab to address functional mobility, endurance, self-care, safety. Additionally, the patient requires daily MD oversight of the active medical issues noted above. Projected goals would be supervision to occasional min assist. Dispo and social supports are appropriate. ELOS 7-10 days     Rehab Admissions Coordinator to follow up.     Ranelle Oyster, MD, [facility] Physical Medicine & Rehabilitation Medical Director Rehabilitation Services 07/08/2023

## 2023-07-09 NOTE — TOC Transition Note (Signed)
 Transition of Care [facility]) - Discharge Note   Patient Details  Name: Mark [name] MRN: [id number] Date of Birth: [birth year]  [facility] CM/SW Contact:  Howell Rucks, RN Phone Number: 07/09/2023, 10:37 AM   Clinical Narrative:   Per teams chat from Ottie Glazier RN w/CIR- has received insurance approval,  CIR bed  available to admit patient today, wife notified. CIR to arrange CareLine for Transport. No further TOC needs identified.       Final next level of care: IP Rehab Facility Barriers to Discharge: Barriers Resolved   Patient Goals and CMS Choice Patient states their goals for this hospitalization and ongoing recovery are:: home CMS Medicare.gov Compare Post Acute Care list provided to:: Patient   [place] ownership interest in [facility].provided to:: Patient    Discharge Placement                       Discharge Plan and Services Additional resources added to the After Visit Summary for     Discharge Planning Services: CM Consult                                 Social Drivers of Health (SDOH) Interventions SDOH Screenings   Food Insecurity: No Food Insecurity (07/06/2023)  Housing: Low Risk  (07/06/2023)  Transportation Needs: No Transportation Needs (07/06/2023)  Utilities: Not At Risk (07/06/2023)  Social Connections: Moderately Integrated (07/04/2023)  Tobacco Use: Medium Risk (07/04/2023)     Readmission Risk Interventions    07/09/2023   10:34 AM 07/06/2023    1:53 PM  Readmission Risk Prevention Plan  Post Dischage Appt Complete   Medication Screening Complete   Transportation Screening Complete Complete  PCP or Specialist Appt within 5-7 Days  Complete  Home Care Screening  Complete  Medication Review (RN CM)  Complete

## 2023-07-09 NOTE — Progress Notes (Signed)
 Fanny Dance, MD  Physician Physical Medicine and Rehabilitation   PMR Pre-admission    Signed   Date of Service: 07/08/2023  1:52 PM  Related encounter: ED to Hosp-Admission (Current) from 07/04/2023 in [facility] [place] [facility]   Signed     Expand All Collapse All  Show:Clear all [x] Written[x] Templated[] Copied  Added by: [x] Standley Brooking, RN[x] Theodoro Kos, Lauren Demetrius Charity, CCC-SLP[x] Fanny Dance, MD  [] Hover for details PMR Admission Coordinator Pre-Admission Assessment   Patient: Mark [name] is an 78 y.o., male MRN: [id number] DOB: Nov 23, 1945 Height: 6' (182.9 cm) Weight: 86.6 kg   Insurance Information HMO:     PPO: yes     PCP:      IPA:      80/20:      OTHER:  PRIMARY: [employer] Medicare HMO/PPO      Policy#: [id number]      Subscriber: patient CM Name: Cassandria Santee      Phone#: [phone]     Fax#: [phone] Pre-Cert#: [id number] approved 07/09/23  until 07/16/23. Updated clinicals requested for 07/14/23.      Employer:  Benefits:  Phone #: [phone]     Name: 3/25 Eff. Date: 04/16/23     Deduct: none      Out of Pocket Max: $4150      Life Max: none CIR: $30 co pay per day days 1 until 6      SNF: $10 co pay per day days 1 until 20; $214 co pay per day days 21 until 100 Outpatient: $10 per visit     Co-Pay:  Home Health: 100%      Co-Pay:  DME: 80%     Co-Pay: 20% Providers: in-network SECONDARY:       Policy#:      Phone#:    Artist:       Phone#:    The Data processing manager" for patients in Inpatient Rehabilitation Facilities with attached "Privacy Act Statement-Health Care Records" was provided and verbally reviewed with: Patient and Family   Emergency Contact Information Contact Information       Name Relation Home Work Mobile    Shackelford-Neuhaus,Lorraine Spouse [phone]   [phone]         Other Contacts       Name Relation Home Work Mobile    Harrington,[name] Daughter  [phone]             Current Medical History  Patient Admitting Diagnosis: ITP, debility   History of Present Illness: Pt is a 78 year old male with medical hx significant for: ITP, 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 Pt presented to [facility] on 07/04/23 d/t thrombocytopenia. Pt had significant petechia over the last 24 hours prior to presentation. Hematologist recommended he go to ER for admission for IVIG.    Pt recently admitted to hospital and treated with 4 days of high-dose Decadron. Treatment recommendation: dose of Nplate, Decadron 40 mg IV daily for 3 days and IVIG for 3 doses. Received on unit platelet on 3/22 after an episode of epistaxis. Nasal packing and given Augmentin.    History of CVA, lupus anticoagulant positive, history of DVT., Xarelto on Hold and ASA due to high risk for bleeding in the setting of severe thrombocytopenia. Will resume with platelet count more than 100,000.Continue amlodipine for essential hypertension. Keppra for history of seizures. Hyperglycemia noted in setting of steroids.  SSI.   Patient's medical record from [facility] has been reviewed by the rehabilitation admission coordinator and physician.   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 No   Family History   family history is not on file.   Current Medications  Current Medications    Current Facility-Administered Medications:    acetaminophen (TYLENOL) tablet 500 mg, 500 mg, Oral, QHS, Ghimire, Kuber, MD, 500 mg at 07/08/23 2127   acetaminophen (TYLENOL) tablet 500 mg, 500 mg, Oral, Daily PRN, Dorcas Carrow, MD, 500 mg at 07/08/23 0103   amLODipine (NORVASC) tablet 10 mg, 10 mg, Oral, QHS, Ghimire, Kuber, MD, 10 mg at 07/08/23 2128   aspirin chewable tablet 81 mg, 81 mg, Oral, Daily, Ennever, Rose Phi, MD, 81 mg at 07/09/23 1001   baclofen (LIORESAL) tablet 10 mg, 10 mg, Oral, BID, Ghimire, Kuber, MD, 10 mg at 07/09/23 1001   buPROPion (WELLBUTRIN XL) 24 hr tablet 150 mg, 150 mg, Oral, q AM, Dorcas Carrow, MD, 150 mg at 07/09/23 1001   cholecalciferol (VITAMIN D3) 25 MCG (1000 UNIT) tablet 2,000 Units, 2,000 Units, Oral, QHS, Ghimire, Kuber, MD, 2,000 Units at 07/08/23 2127   fluticasone (FLONASE) 50 MCG/ACT nasal spray 2 spray, 2 spray, Each Nare, Daily PRN, Dorcas Carrow, MD   gabapentin (NEURONTIN) capsule 100 mg, 100 mg, Oral, QHS, Ghimire, Kuber, MD, 100 mg at 07/08/23 2128   HYDROcodone-acetaminophen (NORCO) 10-325 MG per tablet 1 tablet, 1 tablet, Oral, QHS, Ghimire, Lyndel Safe, MD, 1 tablet at 07/08/23 2127   HYDROcodone-acetaminophen (NORCO) 10-325 MG per tablet 1 tablet, 1 tablet, Oral, Daily PRN, Dorcas Carrow, MD   insulin aspart (novoLOG) injection 0-9 Units, 0-9 Units, Subcutaneous, TID WC, Regalado, Belkys A, MD, 1 Units at 07/09/23 0900   levETIRAcetam (KEPPRA) tablet 1,500 mg, 1,500 mg, Oral, BID, Ghimire, Kuber, MD, 1,500 mg at 07/09/23 1001   living well with diabetes book MISC, , Does not apply, Once, Regalado, Belkys A, MD   nystatin (MYCOSTATIN) 100000 UNIT/ML suspension  500,000 Units, 5 mL, Oral, QID, Ennever, Rose Phi, MD, 500,000 Units at 07/09/23 1001   pantoprazole (PROTONIX) EC tablet 40 mg, 40 mg, Oral, Daily, Ghimire, Kuber, MD, 40 mg at 07/09/23 1001   rivaroxaban (XARELTO) tablet 20 mg, 20 mg, Oral, Q supper, Regalado, Belkys A, MD, 20 mg at 07/08/23 1745   sertraline (ZOLOFT) tablet 200 mg, 200 mg, Oral, q  AM, Dorcas Carrow, MD, 200 mg at 07/09/23 1001     Patients Current Diet:  Diet Order                  Diet vegetarian Room service appropriate? Yes; Fluid consistency: Thin  Diet effective now                       Precautions / Restrictions Precautions Precautions: Fall Precaution/Restrictions Comments: Left hemiplegia Restrictions Weight Bearing Restrictions Per Provider Order: No    Has the patient had 2 or more falls or a fall with injury in the past year? No   Prior Activity Level Limited Community (1-2x/wk): MD appointments   Prior Functional Level Self Care: Did the patient need help bathing, dressing, using the toilet or eating? Needed some help   Indoor Mobility: Did the patient need assistance with walking from room to room (with or without device)? Needed some help   Stairs: Did the patient need assistance with internal or external stairs (with or without device)? Needed some help   Functional Cognition: Did the patient need help planning regular tasks such as shopping or remembering to take medications? Needed some help   Patient Information Are you of Hispanic, Latino/a,or Spanish origin?: X. Patient unable to respond (wife states no) What is your race?: X. Patient unable to respond (wife states white) Do you need or want an interpreter to communicate with a doctor or health care staff?:  (wife states no)   Patient's Response To:  Health Literacy and Transportation Is the patient able to respond to health literacy and transportation needs?: No Health Literacy - How often do you need to have someone help you  when you read instructions, pamphlets, or other written material from your doctor or pharmacy?: Patient unable to respond In the past 12 months, has lack of transportation kept you from medical appointments or from getting medications?: Patient refused (wife states no) In the past 12 months, has lack of transportation kept you from meetings, work, or from getting things needed for daily living?: Patient refused (wife states no)   Journalist, newspaper / Equipment Home Equipment: Information systems manager, BSC/3in1, Medical laboratory scientific officer - single point, Grab bars - toilet, Hand held shower head, Grab bars - tub/shower, Adaptive equipment, Wheelchair - manual, Transport chair   Prior Device Use: Indicate devices/aids used by the patient prior to current illness, exacerbation or injury? Manual wheelchair and Walker   Current Functional Level Cognition   Orientation Level: Oriented X4    Extremity Assessment (includes Sensation/Coordination)   Upper Extremity Assessment: Right hand dominant, LUE deficits/detail LUE Deficits / Details: pt with hx CVA.  L UE non functional, increased tone. Pt reports he does perform ROM as well as the HHOT helps. LUE Coordination: decreased fine motor, decreased gross motor  Lower Extremity Assessment: LLE deficits/detail, Generalized weakness LLE Deficits / Details: baseline L hemiplegia, pt maintains L LE in knee extension and drop foot noted LLE Coordination: decreased fine motor, decreased gross motor     ADLs   Overall ADL's : Needs assistance/impaired Eating/Feeding: Set up Eating/Feeding Details (indicate cue type and reason): one handed techniques Grooming: Wash/dry hands, Wash/dry face, Sitting, Brushing hair, Set up Grooming Details (indicate cue type and reason): declined oral hygiene at this time Upper Body Bathing: Minimal assistance, Sitting Upper Body Bathing Details (indicate cue type and reason): in recliner Lower Body Bathing: Sit to/from stand, Sitting/lateral leans,  Cueing for sequencing, +2 for physical assistance, +2  for safety/equipment, Moderate assistance Upper Body Dressing : Minimal assistance, Sitting Upper Body Dressing Details (indicate cue type and reason): using hemi techniques Lower Body Dressing: Maximal assistance Lower Body Dressing Details (indicate cue type and reason): attempted to help with one handed texhniques in standing to pull up adult absorbent undergarments. Toilet Transfer: Moderate assistance, +2 for physical assistance, +2 for safety/equipment, Cueing for safety, Cueing for sequencing, Stand-pivot Toilet Transfer Details (indicate cue type and reason): cues for hand positioning Toileting- Clothing Manipulation and Hygiene: Maximal assistance, Sitting/lateral lean, Sit to/from stand Toileting - Clothing Manipulation Details (indicate cue type and reason): patient was able to lean sitting on commode to complete part of hygiene tasks with mod A to complete task in standing with patient needing one UE support to maintain balance. General ADL Comments: Spoke with pt and daugther.  Wife is struggling to care for pt at this time.  Pt needing increased A with ADL activity in which wife cant provide.  Daugther feels strongly pt needs rehab and OT also agrees.  Pt at a high fall risk and needs to be stonger to return home.     Mobility   Overal bed mobility: Needs Assistance Bed Mobility: Sit to Supine Rolling: Mod assist Supine to sit: HOB elevated, Used rails, Supervision Sit to supine: Min assist General bed mobility comments: to get BLE back into bed     Transfers   Overall transfer level: Needs assistance Equipment used: Hemi-walker Transfers: Sit to/from Stand Sit to Stand: Mod assist, +2 physical assistance, +2 safety/equipment Bed to/from chair/wheelchair/BSC transfer type:: Stand pivot General transfer comment: assist to rise and stabilize, pt had loss of balance requiring +2 mod assist 2* scissoring; VCs hand placement for  stand to sit, uncontrolled descent     Ambulation / Gait / Stairs / Wheelchair Mobility   Ambulation/Gait Ambulation/Gait assistance: Min assist, +2 safety/equipment Gait Distance (Feet): 35 Feet Assistive device: Hemi-walker Gait Pattern/deviations: Step-to pattern, Decreased weight shift to left, Decreased dorsiflexion - left, Narrow base of support General Gait Details: significant lean of trunk to right posterior (weight over hemiwalker) and likely to compensate to advance L LE; min assist due to weakness and balance; followed with recliner as pt fatigued and became unsteady quickly; +2 with pt for safety Gait velocity: decreased     Posture / Balance Balance Overall balance assessment: Needs assistance Sitting-balance support: Feet supported, No upper extremity supported Sitting balance-Leahy Scale: Fair Postural control: Posterior lean, Right lateral lean Standing balance support: Single extremity supported, Reliant on assistive device for balance, During functional activity Standing balance-Leahy Scale: Zero     Special needs/care consideration Intermittent catherizations at home prior to admit                                                              Wife states HOH and lacks capacity right now, he defers to wife for problem solving Vegetarian and wife assists with ordering all meals   Previous Home Environment  Living Arrangements: Spouse/significant other  Lives With: Spouse Available Help at Discharge: Family, Available 24 hours/day Type of Home: House Home Layout: One level Home Access: Ramped entrance Entrance Stairs-Rails: Can reach both Entrance Stairs-Number of Steps: 4 to porch then 1 step up into house Bathroom Shower/Tub: Engineer, manufacturing systems:  Handicapped height Bathroom Accessibility: Yes How Accessible: Accessible via walker Home Care Services: Yes Type of Home Care Services: Home PT, Home OT Additional Comments: railings on bed   Discharge  Living Setting Plans for Discharge Living Setting: Patient's home Type of Home at Discharge: House Discharge Home Layout: One level Discharge Home Access: Ramped entrance Discharge Bathroom Shower/Tub: Tub/shower unit Discharge Bathroom Toilet: Handicapped height Discharge Bathroom Accessibility: Yes How Accessible: Accessible via walker Does the patient have any problems obtaining your medications?: No   Social/Family/Support Systems Contact Information: wife Anticipated Caregiver: Karin Golden (wife) Anticipated Caregiver's Contact Information: [phone] Caregiver Availability: 24/7 Discharge Plan Discussed with Primary Caregiver: Yes Is Caregiver In Agreement with Plan?: Yes Does Caregiver/Family have Issues with Lodging/Transportation while Pt is in Rehab?: No   Goals Patient/Family Goal for Rehab: supervision to min assist with PT, OT and SLP Expected length of stay: ELOS 7 to 10 days Pt/Family Agrees to Admission and willing to participate: Yes Program Orientation Provided & Reviewed with Pt/Caregiver Including Roles  & Responsibilities: Yes   Decrease burden of Care through IP rehab admission: NA   Possible need for SNF placement upon discharge: Not anticipated   Patient Condition: I have reviewed medical records from [facility], spoken with patient and spouse. I discussed via phone for inpatient rehabilitation assessment.  Patient will benefit from ongoing PT and OT, and SLP can actively participate in 3 hours of therapy a day 5 days of the week, and can make measurable gains during the admission.  Patient will also benefit from the coordinated team approach during an Inpatient Acute Rehabilitation admission.  The patient will receive intensive therapy as well as Rehabilitation physician, nursing, social worker, and care management interventions.  Due to bladder management, safety, skin/wound care, disease management, medication administration, pain management, and  patient education the patient requires 24 hour a day rehabilitation nursing.  The patient is currently min to mod assist overall with mobility and basic ADLs.  Discharge setting and therapy post discharge at home with home health is anticipated.  Patient has agreed to participate in the Acute Inpatient Rehabilitation Program and will admit today.   Preadmission Screen Completed By:  Ottie Glazier RN MSN, 07/09/2023 10:04 AM ______________________________________________________________________   Discussed status with Dr. Natale Lay on 07/09/23 at 1004 and received approval for admission today.   Admission Coordinator:  Ottie Glazier RN MSN, time 1004 Date 07/09/23    Assessment/Plan: Diagnosis: ITP, debility  Does the need for close, 24 hr/day Medical supervision in concert with the patient's rehab needs make it unreasonable for this patient to be served in a less intensive setting? Yes Co-Morbidities requiring supervision/potential complications: Epistaxis, CVA history, CKD 3A, prediabetes, Hx seizures, HTN Due to bladder management, bowel management, safety, skin/wound care, disease management, medication administration, pain management, and patient education, does the patient require 24 hr/day rehab nursing? Yes Does the patient require coordinated care of a physician, rehab nurse, PT, OT, and SLP to address physical and functional deficits in the context of the above medical diagnosis(es)? Yes Addressing deficits in the following areas: balance, endurance, locomotion, strength, transferring, bowel/bladder control, bathing, dressing, feeding, grooming, toileting, and psychosocial support Can the patient actively participate in an intensive therapy program of at least 3 hrs of therapy 5 days a week? Yes The potential for patient to make measurable gains while on inpatient rehab is excellent Anticipated functional outcomes upon discharge from inpatient rehab: supervision and min assist PT,  supervision and min assist OT, supervision and min  assist SLP Estimated rehab length of stay to reach the above functional goals is: 7-10 Anticipated discharge destination: Home 10. Overall Rehab/Functional Prognosis: excellent     MD Signature: Fanny Dance         Revision History

## 2023-07-09 NOTE — Plan of Care (Signed)

## 2023-07-09 NOTE — Progress Notes (Signed)
 Overall, he is doing much better.  His platelet count yesterday was 180,000.  As expected, he goes up very quickly.  Our goal now so try to keep him up.  I think the best way to do this is with Rituxan.  I think we are going to have to do this as an outpatient.  He is awaiting admission over to CIR.  I am not sure when this is going to happen.  His level is not back yet today.  He is now back on anticoagulation with Xarelto.  He also should be on baby aspirin.  He was walking a little bit yesterday.  He was sitting up in a chair.  His appetite is doing great.  He has had no problems with bowels or bladder.  There is no cough or shortness of breath.  He has had no bleeding.  His vital signs are temperature of 98.4.  Pulse 76.  Blood pressure 114/65.  He is head neck exam shows no ocular or oral lesions.  He has no oral petechia.  There is no hematomas in the oral cavity.  Lungs are clear bilaterally.  Cardiac exam regular rate and rhythm.  Abdomen is soft.  Bowel sounds are present.  There is no fluid wave.  There is no palpable liver or spleen tip.  Extremities shows the left side to be flaccid.  Right side is strong.  Neurological exam shows a right side paralysis.   We will see what the CBC shows today.  I would think that his platelet count should be close to 300,000.  We will await him to go to CIR.  Am so happy that he is getting such great care from everybody on 6 E.  He is very impressed with the care that he is being given.   Christin Bach, MD  1 Cor 16:13

## 2023-07-10 DIAGNOSIS — R5381 Other malaise: Secondary | ICD-10-CM | POA: Diagnosis not present

## 2023-07-10 LAB — CBC WITH DIFFERENTIAL/PLATELET
Abs Immature Granulocytes: 0.67 10*3/uL — ABNORMAL HIGH (ref 0.00–0.07)
Basophils Absolute: 0.2 10*3/uL — ABNORMAL HIGH (ref 0.0–0.1)
Basophils Relative: 1 %
Eosinophils Absolute: 0.3 10*3/uL (ref 0.0–0.5)
Eosinophils Relative: 2 %
HCT: 37.1 % — ABNORMAL LOW (ref 39.0–52.0)
Hemoglobin: 12.1 g/dL — ABNORMAL LOW (ref 13.0–17.0)
Immature Granulocytes: 5 %
Lymphocytes Relative: 29 %
Lymphs Abs: 4.2 10*3/uL — ABNORMAL HIGH (ref 0.7–4.0)
MCH: 30 pg (ref 26.0–34.0)
MCHC: 32.6 g/dL (ref 30.0–36.0)
MCV: 91.8 fL (ref 80.0–100.0)
Monocytes Absolute: 2.6 10*3/uL — ABNORMAL HIGH (ref 0.1–1.0)
Monocytes Relative: 17 %
Neutro Abs: 6.9 10*3/uL (ref 1.7–7.7)
Neutrophils Relative %: 46 %
Platelets: 465 10*3/uL — ABNORMAL HIGH (ref 150–400)
RBC: 4.04 MIL/uL — ABNORMAL LOW (ref 4.22–5.81)
RDW: 18.1 % — ABNORMAL HIGH (ref 11.5–15.5)
WBC: 14.8 10*3/uL — ABNORMAL HIGH (ref 4.0–10.5)
nRBC: 2.2 % — ABNORMAL HIGH (ref 0.0–0.2)

## 2023-07-10 LAB — COMPREHENSIVE METABOLIC PANEL WITH GFR
ALT: 71 U/L — ABNORMAL HIGH (ref 0–44)
AST: 43 U/L — ABNORMAL HIGH (ref 15–41)
Albumin: 2.6 g/dL — ABNORMAL LOW (ref 3.5–5.0)
Alkaline Phosphatase: 43 U/L (ref 38–126)
Anion gap: 7 (ref 5–15)
BUN: 33 mg/dL — ABNORMAL HIGH (ref 8–23)
CO2: 24 mmol/L (ref 22–32)
Calcium: 8.5 mg/dL — ABNORMAL LOW (ref 8.9–10.3)
Chloride: 108 mmol/L (ref 98–111)
Creatinine, Ser: 1.27 mg/dL — ABNORMAL HIGH (ref 0.61–1.24)
GFR, Estimated: 58 mL/min — ABNORMAL LOW (ref >60)
Glucose, Bld: 107 mg/dL — ABNORMAL HIGH (ref 70–99)
Potassium: 4.3 mmol/L (ref 3.5–5.1)
Sodium: 139 mmol/L (ref 135–145)
Total Bilirubin: 0.7 mg/dL (ref 0.0–1.2)
Total Protein: 7.3 g/dL (ref 6.5–8.1)

## 2023-07-10 LAB — GLUCOSE, CAPILLARY
Glucose-Capillary: 90 mg/dL (ref 70–99)
Glucose-Capillary: 120 mg/dL — ABNORMAL HIGH (ref 70–99)

## 2023-07-10 LAB — VITAMIN D 25 HYDROXY (VIT D DEFICIENCY, FRACTURES): Vit D, 25-Hydroxy: 32.28 ng/mL (ref 30–100)

## 2023-07-10 LAB — MAGNESIUM: Magnesium: 2.2 mg/dL (ref 1.7–2.4)

## 2023-07-10 MED ORDER — CHLORHEXIDINE GLUCONATE CLOTH 2 % EX PADS
6.0000 | MEDICATED_PAD | Freq: Two times a day (BID) | CUTANEOUS | Status: DC
  Administered 2023-07-10 – 2023-07-13 (×5): 6 via TOPICAL

## 2023-07-10 NOTE — Progress Notes (Signed)
 Occupational Therapy Session Note  Patient Details  Name: Mark [name] MRN: [id number] Date of Birth: 1945/08/26  Today's Date: 07/10/2023 OT Individual Time: 1430-1510 OT Individual Time Calculation (min): 40 min    Short Term Goals: Week 1:   TBD   Therapy Documentation Precautions:  Precautions Precautions: Fall Precaution/Restrictions Comments: Left hemiplegia CVA prior to admit Restrictions Weight Bearing Restrictions Per Provider Order: No General: "I hope I see you again!" Pt seated in W/C upon OT arrival, agreeable to OT.  Pain: no pain reported  ADL: OT provided skilled intervention for functional mobility throughout room and to hallway. Pt complete with CGA using hemi walker for ~50 ft altogether. Pt requiring  seated rest break after mobility d/t decreased endurance.   Other Treatments: OT providing therapeutic use of self in order to build rapport and discuss patient current situation and goals for therapy. OT educating pt to use tone in LUE in order to stabilize items for ADLs, pt receptive to information and willing to attempt. Pt received PRAFO and wrist splint during session but required resting hand splint instead. OT providing PROM to Lt hand for preparation of splinting. OT educated pt splint schedule but to remove if painful or causing skin irritation.   Pt seated in W/C at end of session with W/C alarm donned, call light within reach and 4Ps assessed.    Therapy/Group: Individual Therapy  Velia Meyer, OTD, OTR/L 07/10/2023, 3:28 PM

## 2023-07-10 NOTE — Progress Notes (Signed)
 Encouraged pt to drink water and educated on renal health     07/10/23 1830  Intake (mL)  P.O. 100 mL  Urine Characteristics  Urinary Incontinence No  Urine Color Amber  Urine Appearance Clear  Urinary Interventions Intermittent/Straight cath  Intermittent/Straight Cath (mL) 100 mL  Hygiene Peri care

## 2023-07-10 NOTE — Progress Notes (Signed)
 Inpatient Rehabilitation Care Coordinator Assessment and Plan Patient Details  Name: Mark [name] MRN: [id number] Date of Birth: 1945/09/03  Today's Date: 07/10/2023  Hospital Problems: Principal Problem:   Debility  Past Medical History:  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    Lupus anticoagulant disorder (HCC)    Lupus anticoagulant syndrome (HCC)    Multiple closed anterior-posterior compression fractures of pelvis (HCC)    Seizures (HCC)    last seizure 10 years ago approx    Sleep apnea    cpap broken x 1 year per wife    Stroke (HCC) 02/27/2011   Left side weakness   Past Surgical History:  Past Surgical History:  Procedure Laterality Date   blood clot Right    surgical removal   CHOLECYSTECTOMY     CORONARY ANGIOPLASTY  1992   CYSTOGRAM N/A 02/17/2020   Procedure: CYSTOGRAM;  Surgeon: Crist Fat, MD;  Location: [facility];  Service: Urology;  Laterality: N/A;   CYSTOSCOPY WITH RETROGRADE PYELOGRAM, URETEROSCOPY AND STENT PLACEMENT Bilateral 02/17/2020   Procedure: Effie Shy  URETEROSCOPY AND STENT PLACEMENT;  Surgeon: Crist Fat, MD;  Location: [facility];  Service: Urology;  Laterality: Bilateral;   SPLENECTOMY, TOTAL     vertebralplasty     Social History:  reports that he quit smoking about 38 years ago. His smoking use included cigarettes. He started smoking about 55 years ago. He has a 16.6 pack-year smoking history. He has never used smokeless tobacco. He reports that he does not currently use drugs. He reports that he does not drink alcohol.  Family / Support  Systems Marital Status: Married Patient Roles: Spouse, Parent, Other (Comment) (retiree) Spouse/Significant Other: Judie Bonus [phone] Children: [name]-daughter [phone]  Has another daughter who lives in [place] comes yearly to see them Other Supports: Extended family-his sister and grandson Anticipated Caregiver: Wife daughter and grandson get into the car wife is not able too Ability/Limitations of Caregiver: Wife is disabled according to pt and does a lot but needs to be careful with her own health issues Caregiver Availability: 24/7 Family Dynamics: Close knit family who support one another and will be there. He reports they are installing a ramp that will help him. He relies on them and he is very Academic librarian  Social History Preferred language: English Religion: Protestant Cultural Background: NA Education: Charity fundraiser - How often do you need to have someone help you when you read instructions, pamphlets, or other written material from your doctor or pharmacy?: Never Writes: Yes Employment Status: Retired Marine scientist Issues: NA Guardian/Conservator: NA-according to MD pt is capable of making his own decisions while here.   Abuse/Neglect Abuse/Neglect Assessment Can Be Completed: Yes Physical Abuse: Denies Verbal Abuse: Denies Sexual Abuse: Denies Exploitation of patient/patient's resources: Denies Self-Neglect: Denies  Patient response to: Social Isolation - How often do you feel lonely or isolated from those around you?: Never  Emotional Status Pt's affect, behavior and adjustment status: Pt is motivated to recover and get back to his prior function prior to relapse.  He has deficits from his stroke but wasd managing and using a rolling walker or wheelchair. 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 He ws doing well at home prior to this occuring. He talks with the MD's involved and feels he understands his treatment moving forward. Premorbid pt/family roles/activities: Husband, father, grandfather, retiree, sibling, friend, etc Anticipated changes in roles/activities/participation: resume Pt/family expectations/goals: Pt states: " I hope to do well and not burden my wife any more than I already do."  [employer]: Other (Comment) Premorbid Home Care/DME Agencies: Other (Comment) (Active with [facility] has wc, transport chair, bsc, tb seat, grab bars. Also having ramp installed) Transportation available at discharge: wife or family member Is the patient able to respond to transportation needs?: Yes In the past 12 months, has lack of transportation kept you from medical appointments or from getting medications?: No In the past 12 months, has lack of transportation kept you from meetings, work, or from getting things needed for daily living?: No Resource referrals recommended: Neuropsychology  Discharge Planning Living Arrangements: Spouse/significant other Support Systems: Spouse/significant other, Children, Other relatives, Friends/neighbors Type of Residence: Private residence Insurance Resources: Media planner (specify) Radiographer, therapeutic) Financial Resources: Social Security, Family Support Financial Screen Referred: No Living Expenses: Own Money Management: Patient, Spouse Does the patient have any problems obtaining your medications?: No Home Management: wife Patient/Family Preliminary Plans: Return home with wife who does have her own health issues and can only do so much for  pt. Their daughter and grandson assist when they can. Aware being evaluated today and goals being set for stay here. Care Coordinator Barriers to Discharge: Decreased caregiver support, Insurance for SNF coverage, Lack of/limited family support Care Coordinator Anticipated Follow Up Needs: HH/OP  Clinical Impression Pleasant gentleman who has multiple health issues but does his best with his abilities. His wife has assisted along with their daughter and grandson. Wife has health issues and needs to be careful, she can only do so much for pt. Will await therapy evaluations and work on discharge needs. Will place on neuro-psych list to be seen next week.  Lucy Chris 07/10/2023, 11:45 AM

## 2023-07-10 NOTE — Evaluation (Signed)
 Physical Therapy Assessment and Plan  Patient Details  Name: Mark [name] MRN: [id number] Date of Birth: [birth year]  PT Diagnosis: Abnormal posture, Abnormality of gait, Coordination disorder, Difficulty walking, Hemiparesis non-dominant, Hypertonia, Impaired sensation, Muscle weakness, and generalized pain Rehab Potential: Good ELOS: ~12 days   Today's Date: 07/10/2023 PT Individual Time: 4098-1191 PT Individual Time Calculation (min): 72 min    Hospital Problem: Principal Problem:   Debility   Past Medical History:  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    Lupus anticoagulant disorder (HCC)    Lupus anticoagulant syndrome (HCC)    Multiple closed anterior-posterior compression fractures of pelvis (HCC)    Seizures (HCC)    last seizure 10 years ago approx    Sleep apnea    cpap broken x 1 year per wife    Stroke (HCC) 02/27/2011   Left side weakness   Past Surgical History:  Past Surgical History:  Procedure Laterality Date   blood clot Right    surgical removal   CHOLECYSTECTOMY     CORONARY ANGIOPLASTY  1992   CYSTOGRAM N/A 02/17/2020   Procedure: CYSTOGRAM;  Surgeon: Mark Fat, MD;  Location: [facility];  Service: Urology;  Laterality: N/A;   CYSTOSCOPY WITH RETROGRADE PYELOGRAM, URETEROSCOPY AND STENT PLACEMENT Bilateral 02/17/2020   Procedure: Mark [name]  URETEROSCOPY AND STENT PLACEMENT;  Surgeon: Mark Fat, MD;  Location: [facility];  Service: Urology;  Laterality: Bilateral;   SPLENECTOMY, TOTAL     vertebralplasty      Assessment & Plan Clinical Impression: Patient is a  78 y.o. year old male with history of DVT, HTN, CVA X 2 with mild left sided weakness and sensory loss and chronic dysphagia, CKD III- baseline SCr 1.3, seizure d/o, HOH, urinary retention requiring self caths, Lupus anticoagulant- on Xarelto, chronic ITP with recent admission for high dose decadron who was seen in office by Dr. Myna [name] on 07/04/23 on follow up and reported intermittent epistaxis and found to have drop in platelets to 17. He was admitted to [facility] the same day and started on high dose decadron X 4days, IVIG X 2 days and nplate per input from Dr. Myna [name]. Did require nasal packing for nose bleed which was covered with Augmentin and has resolved. Xarelto placed on hold briefly and resumed yesterday. Elevated BS managed with SSI. Platelets have improved to 302 and rise in WBC to 20 felt to be reactive in nature.  Retuxin considered outpatient.  Pt reports he had shingles pain R upper back and in his hand. This has improved now 1/10 intensity.    ST consulted for evaluation of swallow and recommended aspiration precautions and chin tuck with liquids due to hx of penetration/tendency to "get strangled". PT/OT consulted and patient was noted to be debilitated. He requires min to max assist with ADLS and noted to have significant balance deficits with mobility due to weakness. Marland Kitchen PTA was independent with use of HW and occasionally needed some assistance by his wife. CIR recommended due to functional decline.   Patient currently  requires mod with mobility secondary to muscle weakness and muscle joint tightness, decreased cardiorespiratoy endurance, impaired timing and sequencing, abnormal tone, unbalanced muscle activation, decreased coordination, and decreased motor planning, and decreased standing balance, decreased postural control, hemiplegia, and decreased balance strategies.  Prior to hospitalization, patient was modified independent  with mobility and lived with Spouse in a House home.  Home access  is 5 STE but working on getting ramp installedStairs to enter.  Patient will benefit from skilled PT intervention to maximize safe functional mobility, minimize fall risk, and decrease caregiver burden for planned discharge home with 24 hour supervision.  Anticipate patient will benefit from follow up HH at discharge.  PT - End of Session Activity Tolerance: Tolerates 30+ min activity with multiple rests Endurance Deficit: Yes Endurance Deficit Description: fatigued after ambulating PT Assessment Rehab Potential (ACUTE/IP ONLY): Good PT Barriers to Discharge: Inaccessible home environment;Home environment access/layout;Other (comments) PT Barriers to Discharge Comments: L hemiparesis with hypertonia, STE, decreased balance/coordination PT Patient demonstrates impairments in the following area(s): Balance;Endurance;Motor;Perception;Sensory;Skin Integrity PT Transfers Functional Problem(s): Bed Mobility;Bed to Chair;Car;Furniture PT Locomotion Functional Problem(s): Ambulation;Wheelchair Mobility;Stairs PT Plan PT Intensity: Minimum of 1-2 x/day ,45 to 90 minutes PT Frequency: 5 out of 7 days PT Duration Estimated Length of Stay: ~12 days PT Treatment/Interventions: Ambulation/gait training;Discharge planning;Functional mobility training;Psychosocial support;Therapeutic Activities;Visual/perceptual remediation/compensation;Balance/vestibular training;Disease management/prevention;Neuromuscular re-education;Skin care/wound management;Therapeutic Exercise;Wheelchair propulsion/positioning;Cognitive remediation/compensation;DME/adaptive equipment instruction;Pain management;Splinting/orthotics;UE/LE Strength taining/ROM;Community reintegration;Functional electrical stimulation;Patient/family education;Stair training;UE/LE Coordination activities PT Transfers Anticipated Outcome(s): supervision with LRAD PT Locomotion Anticipated Outcome(s): supervision with LRAD PT Recommendation Recommendations  for Other Services: Therapeutic Recreation consult Therapeutic Recreation Interventions: Pet therapy;Stress management Follow Up Recommendations: Home health PT Patient destination: Home Equipment Recommended: To be determined Equipment Details: has hemi walker, L PFRW, and WC   PT Evaluation Precautions/Restrictions Precautions Precautions: Fall Precaution/Restrictions Comments: Left hemiplegia Restrictions Weight Bearing Restrictions Per Provider Order: No Pain Interference Pain Interference Pain Effect on Sleep: 1. Rarely or not at all Pain Interference with Therapy Activities: 1. Rarely or not at all Pain Interference with Day-to-Day Activities: 1. Rarely or not at all Home Living/Prior Functioning Home Living Living Arrangements: Spouse/significant other Available Help at Discharge: Family;Available 24 hours/day Type of Home: House Home Access: Stairs to enter [employer] of Steps: 5 STE but working on getting ramp installed Entrance Stairs-Rails: Left;Right;Can reach both Home Layout: One level Bathroom Shower/Tub: Engineer, manufacturing systems: Handicapped height Bathroom Accessibility: Yes Additional Comments: pt reports being ambulatory for household distances using hemi walker. Pt has L PFRW, WC, shower chair/bench, bedside commode. Pt reports being independnet with cathing at home prior to admission.  Lives With: Spouse Prior Function Level of Independence: Requires assistive device for independence  Able to Take Stairs?: No Driving: Yes Vision/Perception  Vision - History Ability to See in Adequate Light: 0 Adequate  Cognition Overall Cognitive Status: Within Functional Limits for tasks assessed Arousal/Alertness: Awake/alert Orientation Level: Oriented X4 Year: 2025 Month: March Day of Week: Correct Attention: Sustained Sustained Attention: Appears intact Memory: Appears intact Awareness: Impaired Awareness Impairment: Emergent  impairment Problem Solving: Appears intact Safety/Judgment: Appears intact Sensation Sensation Light Touch: Impaired Detail Light Touch Impaired Details: Impaired RLE;Impaired LLE Hot/Cold: Not tested Proprioception: Impaired by gross assessment Stereognosis: Not tested Additional Comments: decreased sensation along LLE, absent sensation along bilateral great toes. plantarflexion and inversion contracture/tone in L ankle. Coordination Gross Motor Movements are Fluid and Coordinated: No Fine Motor Movements are Fluid and Coordinated: No Coordination and Movement Description: grossly uncoordinated due to L hemiparesis,  tone in LUE/LLE, and decreased balance/coordination Finger Nose Finger Test: unable to perform on LUE Heel Shin Test: unable to perform on LLE Motor  Motor Motor: Hemiplegia;Abnormal tone Motor - Skilled Clinical Observations: L hemiparesis and tone in LUE/LLE  Trunk/Postural Assessment  Cervical Assessment Cervical Assessment: Exceptions to [facility] (forward head) Thoracic Assessment Thoracic Assessment: Exceptions to [facility] (rounded shoulders) Lumbar Assessment Lumbar Assessment: Exceptions to [facility] (posterior pelvic tilt) Postural Control Postural Control: Deficits on evaluation Righting Reactions: delayed on L Protective Responses: delayed on L  Balance Balance Balance Assessed: Yes Static Sitting Balance Static Sitting - Balance Support: Feet supported;Right upper extremity supported Static Sitting - Level of Assistance: 5: Stand by assistance (supervision) Dynamic Sitting Balance Dynamic Sitting - Balance Support: Feet supported;No upper extremity supported Dynamic Sitting - Level of Assistance: 5: Stand by assistance (supervision) Static Standing Balance Static Standing - Balance Support: Right upper extremity supported;During functional activity (hemi walker) Static Standing - Level of Assistance: 4: Min assist Dynamic Standing Balance Dynamic Standing -  Balance Support: Right upper extremity supported;During functional activity (hemi walker) Dynamic Standing - Level of Assistance: 3: Mod assist Dynamic Standing - Comments: with transfers and gait Extremity Assessment  RLE Assessment RLE Assessment: Exceptions to [facility] General Strength Comments: tested sitting EOB RLE Strength Right Hip Flexion: 4-/5 Right Hip ABduction: 3+/5 Right Hip ADduction: 3+/5 Right Knee Flexion: 4-/5 Right Knee Extension: 4-/5 Right Ankle Dorsiflexion: 4/5 Right Ankle Plantar Flexion: 4/5 LLE Assessment LLE Assessment: Exceptions to [facility] General Strength Comments: tested sitting EOB LLE Strength Left Hip Flexion: 3+/5 Left Hip ABduction: 3/5 Left Hip ADduction: 3/5 Left Knee Flexion: 4-/5 Left Knee Extension: 3+/5 Left Ankle Dorsiflexion: 0/5 Left Ankle Plantar Flexion: 1/5 LLE Tone LLE Tone: Moderate  Care Tool Care Tool Bed Mobility Roll left and right activity   Roll left and right assist level: Contact Guard/Touching assist    Sit to lying activity        Lying to sitting on side of bed activity   Lying to sitting on side of bed assist level: the ability to move from lying on the back to sitting on the side of the bed with no back support.: Contact Guard/Touching assist     Care Tool Transfers Sit to stand transfer   Sit to stand assist level: Moderate Assistance - Patient 50 - 74%    Chair/bed transfer   Chair/bed transfer assist level: Moderate Assistance - Patient 50 - 74%    Car transfer Car transfer activity did not occur: Safety/medical concerns (fatigue)        Care Tool Locomotion Ambulation   Assist level: 2 helpers Assistive device: Other (comment) (hemi walker) Max distance: 137ft  Walk 10 feet activity   Assist level: 2 helpers Assistive device: Other (comment) (hemi walker)   Walk 50 feet with 2 turns activity   Assist level: 2 helpers Assistive device: Other (comment) (hemi walker)  Walk 150 feet activity Walk  150 feet activity did not occur: Safety/medical concerns (fatigue)      Walk 10 feet on uneven surfaces activity Walk 10 feet on uneven surfaces activity did not occur: Safety/medical concerns (fatigue)      Stairs Stair activity did not occur: Safety/medical concerns (fatigue)        Walk up/down 1 step activity Walk up/down 1 step or curb (drop down) activity did not occur: Safety/medical concerns (fatigue)      Walk up/down 4 steps activity Walk up/down 4 steps activity did not occur: Safety/medical  concerns (fatigue)      Walk up/down 12 steps activity Walk up/down 12 steps activity did not occur: Safety/medical concerns (fatigue)      Pick up small objects from floor Pick up small object from the floor (from standing position) activity did not occur: Safety/medical concerns (fatigue, decreased balance)      Wheelchair Is the patient using a wheelchair?: Yes Type of Wheelchair: Manual   Wheelchair assist level: Dependent - Patient 0% Max wheelchair distance: 169ft  Wheel 50 feet with 2 turns activity   Assist Level: Dependent - Patient 0%  Wheel 150 feet activity   Assist Level: Dependent - Patient 0%    Refer to Care Plan for Long Term Goals  SHORT TERM GOAL WEEK 1 PT Short Term Goal 1 (Week 1): pt will transfer sit<>stand with LRAD and CGA PT Short Term Goal 2 (Week 1): pt will transfer bed<>chair with LRAD and CGA PT Short Term Goal 3 (Week 1): pt will ambulate 110ft with LRAD and CGA  Recommendations for other services: Therapeutic Recreation  Pet therapy and Stress management  Skilled Therapeutic Intervention Evaluation completed (see details above and below) with education on PT POC and goals and individual treatment initiated with focus on functional mobility/transfers, generalized strengthening and endurance, dynamic standing balance/coordination, and ambulation. Received pt semi-reclined in bed finishing being cathed with RN. Pt educated on PT evaluation, CIR  policies, and therapy schedule and agreeable. Pt reported generalized discomfort from "getting over shingles".   Provided pt with 18x18 manual WC, hemi walker, and L platform attachment for RW. Pt transferred semi-reclined<>sitting R EOB with HOB elevated and use of bedrails with CGA with increased time/effort. MD arrived for morning rounds. Donned pants sitting EOB with max A and stood from EOB with hemi walker and mod A and required total A to pull pants over hips. Performed stand<>pivot into WC with hemi walker and mod A - increased difficulty sequencing stepping and placing L foot.   Pt transported to/from room in [facility] dependently for time management purposes. Stood from [facility] with hemi walker and mod A - LOB requiring assist to correct. Pt ambulated 14ft with hemi walker and mod A with +2 for WC follow. Pt required cues for hemi walker placement, to widen BOS, and increase R step/stride length - limited by fatigue. Returned to room and concluded session with pt sitting in WC, needs within reach, and seatbelt alarm on. Pillow placed under LUE for improved hemibody positioning as well as wash cloth in L hand. Safety plan updated.   Mobility Bed Mobility Bed Mobility: Rolling Right;Supine to Sit Rolling Right: Contact Guard/Touching assist Supine to Sit: Contact Guard/Touching assist Transfers Transfers: Sit to Stand;Stand to Sit;Stand Pivot Transfers Sit to Stand: Moderate Assistance - Patient 50-74% Stand to Sit: Minimal Assistance - Patient > 75% Stand Pivot Transfers: Moderate Assistance - Patient 50 - 74% Stand Pivot Transfer Details: Verbal cues for technique;Visual cues/gestures for sequencing;Verbal cues for safe use of DME/AE Stand Pivot Transfer Details (indicate cue type and reason): verbal cues for stepping sequencing and hemi walker placement Transfer (Assistive device): Hemi-walker Locomotion  Gait Ambulation: Yes Gait Assistance: 2 Helpers;Moderate Assistance - Patient 50-74% Gait  Distance (Feet): 108 Feet Assistive device: Hemi-walker Gait Assistance Details: Verbal cues for gait pattern;Verbal cues for sequencing;Verbal cues for technique;Verbal cues for safe use of DME/AE Gait Assistance Details: verbal cues for hemi walker placement, step/stride length, and sequencing stepping pattern Gait Gait: Yes Gait Pattern: Impaired Gait Pattern: Step-to pattern;Decreased step  length - right;Decreased step length - left;Decreased stride length;Decreased dorsiflexion - left;Decreased hip/knee flexion - left;Decreased weight shift to left;Poor foot clearance - right;Poor foot clearance - left;Narrow base of support Gait velocity: decreased Stairs / Additional Locomotion Stairs: No Wheelchair Mobility Wheelchair Mobility: Yes Wheelchair Assistance: Dependent - Patient 0% Wheelchair Parts Management: Needs assistance Distance: 163ft   Discharge Criteria: Patient will be discharged from PT if patient refuses treatment 3 consecutive times without medical reason, if treatment goals not met, if there is a change in medical status, if patient makes no progress towards goals or if patient is discharged from hospital.  The above assessment, treatment plan, treatment alternatives and goals were discussed and mutually agreed upon: by patient  Huntley Dec PT, DPT 07/10/2023, 12:22 PM

## 2023-07-10 NOTE — Progress Notes (Signed)
 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 Recent Labs    07/09/23 0509 07/10/23 0554  WBC 20.0* 14.8*  HGB 10.8* 12.1*  HCT 34.3* 37.1*  PLT 302 465*   Recent Labs    07/09/23 0509 07/10/23 0554  NA 136 139  K 3.8 4.3  CL 107 108  CO2 20* 24  GLUCOSE 156* 107*  BUN 40* 33*  CREATININE 1.17 1.27*  CALCIUM 8.3* 8.5*    Intake/Output Summary (Last 24 hours) at 07/10/2023 1115 Last data filed at 07/10/2023 0846 Gross per 24 hour  Intake 1050 ml  Output 3449 ml  Net -2399 ml        Physical Exam: Vital Signs Blood pressure 123/84, pulse 63, temperature 98.2 F (36.8 C), temperature source Oral, resp. rate 16, height 5\' 11"  (1.803 m), weight 83.3 kg, SpO2 96%. Gen: no distress, normal appearing HEENT: oral mucosa pink and moist, NCAT Cardio: Reg rate Chest: normal effort, normal rate of breathing Abd: soft, non-distended Ext: no edema Psych: pleasant, normal affect Skin: intact Neuro:     Mental Status: AAOx3, long term memory deficits Speech/Languate: Naming and repetition intact, fluent, follows simple commands CRANIAL NERVES: 2-12 grossly intact     MOTOR: RUE: 5/5 Deltoid, 5/5 Biceps, 5/5 Triceps,5/5 Grip LUE: 0/5 Deltoid, 0/5 Biceps, 1-2/5 Triceps, 1/5 Grip RLE: HF 5/5, KE 5/5, ADF 5/5, APF 5/5 LLE: HF 4-/5, KE 4-/5, ADF 2-/5, APF 4-/5, stable 3/27   SENSORY: Altered to LT Lue and LLE   MSK: Increased tone LUE with hand in fist and increased elbow extensor tone Decreased dorsiflexion ROM LLE  Atrophy R hand intrinsic muscles    Assessment/Plan: 1. Functional deficits which require 3+ hours per day of interdisciplinary therapy in a comprehensive inpatient rehab setting. Physiatrist is providing close team supervision  and 24 hour management of active medical problems listed below. Physiatrist and rehab team continue to assess barriers to discharge/monitor patient progress toward functional and medical goals  Care Tool:  Bathing              Bathing assist       Upper Body Dressing/Undressing Upper body dressing        Upper body assist      Lower Body Dressing/Undressing Lower body dressing            Lower body assist       Toileting Toileting    Toileting assist       Transfers Chair/bed transfer  Transfers assist     Chair/bed transfer assist level: Moderate Assistance - Patient 50 - 74%     Locomotion Ambulation   Ambulation assist      Assist level: 2 helpers Assistive device: Other (comment) (hemi walker) Max distance: 146ft   Walk 10 feet activity   Assist     Assist level: 2 helpers Assistive device: Other (comment) (hemi walker)   Walk 50 feet activity   Assist    Assist level: 2 helpers Assistive device: Other (comment) (hemi  walker)    Walk 150 feet activity   Assist Walk 150 feet activity did not occur: Safety/medical concerns (fatigue)         Walk 10 feet on uneven surface  activity   Assist Walk 10 feet on uneven surfaces activity did not occur: Safety/medical concerns (fatigue)         Wheelchair     Assist Is the patient using a wheelchair?: Yes Type of Wheelchair: Manual    Wheelchair assist level: Dependent - Patient 0% Max wheelchair distance: 140ft    Wheelchair 50 feet with 2 turns activity    Assist        Assist Level: Dependent - Patient 0%   Wheelchair 150 feet activity     Assist      Assist Level: Dependent - Patient 0%   Blood pressure 123/84, pulse 63, temperature 98.2 F (36.8 C), temperature source Oral, resp. rate 16, height 5\' 11"  (1.803 m), weight 83.3 kg, SpO2 96%.  Medical Problem List and Plan: 1. Functional deficits secondary to debility due to ITP              -patient may  shower             -ELOS/Goals: 7-10 days, Sup to Min a PT/OT/SLP             -Admit to CIR 2.  Antithrombotics: -DVT/anticoagulation:  Pharmaceutical: Xarelto             -antiplatelet therapy: ASA 3.Right foot neuropathic pain: scheduled for outpatient Qutenza. Tylenol prn. Continue gabapentin. Continue Norco PRN.  4. Mood/Behavior/Sleep: LCSW to follow for evaluation and support.              -antipsychotic agents: N/A 5. Neuropsych/cognition: This patient is capable of making decisions on his own behalf. 6. Skin/Wound Care: Routine pressure relief measures.  7. Fluids/Electrolytes/Nutrition: Strict I/O. Daily CMET per hem/Onc 8.  Chronic ITP w/relapse: Has been received Decadron, IVIG and Nplate             --plans for rituxan on outpatient basis per Dr. Myna Hidalgo             --continue daily CMET/CBC X 4 days per input from Dr. Myna Hidalgo.  9. Epistaxis: Nasal packing--augmentin X 3 d/c on 03/25. 10 .Prediabetes: Hgb A1C- 6.2. Elevated due to steriods but trending down. --Continue to monitor BS ac/hs and use SSI for elevated BS 11. H/o CVA X 2 w/neurogenic bladder: On Xarelto and ASA.             --At baseline completes intermittent self cath several times a day.  12.Seizure d/o: Seizure free on Keppra 1500 mg bid 13. Anxiety/depression: On Buropion and Sertraline 14. CKD 3A: BUN/SCr 24/1.3--> Baseline SCr 1.3-->40/1.17. Avoid nephrotoxic medications.  .  16. Leucocytosis: May be steroid related. WBC reviewed and is improving  17. Essential HTN. Monitor BP. Continue norvasc, add on magnesium level  18. Spasticity. Continue baclofen  19. History of vitamin D deficiency: add on vitamin D level  20. Transaminitis: d/c tylenol       LOS: 1 days A FACE TO FACE EVALUATION WAS PERFORMED  Mark [name] P Najee Cowens 07/10/2023, 11:15 AM

## 2023-07-10 NOTE — Progress Notes (Signed)
 Inpatient Rehabilitation  Patient information reviewed and entered into eRehab system by [facility]. Karen Kays., CCC/SLP, PPS Coordinator.  Information including medical coding, functional ability and quality indicators will be reviewed and updated through discharge.

## 2023-07-10 NOTE — Progress Notes (Signed)
 Orthopedic Tech Progress Note Patient Details:  Mark [name] May 24, 1945 [id number]  Prafo delivered to room, but not applied d/t reasoning listed below. A resting WHO for the LUE was called into the [facility].  Ortho Devices Type of Ortho Device: Prafo boot/shoe Ortho Device/Splint Location: For LLE, at bedside as pt was OOB working with OT Ortho Device/Splint Interventions: Ordered, Application, Adjustment   Post Interventions Instructions Provided: Care of device, Adjustment of device  [name] Carmine Savoy 07/10/2023, 3:09 PM

## 2023-07-10 NOTE — Evaluation (Signed)
 Occupational Therapy Assessment and Plan  Patient Details  Name: Mark [name] MRN: [id number] Date of Birth: 19-Jun-1945  OT Diagnosis: muscle weakness (generalized) Rehab Potential: Rehab Potential (ACUTE ONLY): Good ELOS: ~10-12 DAYS   Today's Date: 07/10/2023 OT Individual Time: 1300-1415 OT Individual Time Calculation (min): 75 min     Hospital Problem: Principal Problem:   Debility   Past Medical History:  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    Lupus anticoagulant disorder (HCC)    Lupus anticoagulant syndrome (HCC)    Multiple closed anterior-posterior compression fractures of pelvis (HCC)    Seizures (HCC)    last seizure 10 years ago approx    Sleep apnea    cpap broken x 1 year per wife    Stroke (HCC) 02/27/2011   Left side weakness   Past Surgical History:  Past Surgical History:  Procedure Laterality Date   blood clot Right    surgical removal   CHOLECYSTECTOMY     CORONARY ANGIOPLASTY  1992   CYSTOGRAM N/A 02/17/2020   Procedure: CYSTOGRAM;  Surgeon: Crist Fat, MD;  Location: [facility];  Service: Urology;  Laterality: N/A;   CYSTOSCOPY WITH RETROGRADE PYELOGRAM, URETEROSCOPY AND STENT PLACEMENT Bilateral 02/17/2020   Procedure: Effie Shy  URETEROSCOPY AND STENT PLACEMENT;  Surgeon: Crist Fat, MD;  Location: [facility];  Service: Urology;  Laterality: Bilateral;   SPLENECTOMY, TOTAL     vertebralplasty      Assessment & Plan Clinical Impression: Patient is a 78 y.o. year old male  history of DVT, HTN, CVA X 2 with mild left sided weakness and sensory loss and chronic  dysphagia, CKD III- baseline SCr 1.3, seizure d/o, HOH, urinary retention requiring self caths, Lupus anticoagulant- on Xarelto, chronic ITP with recent admission for high dose decadron who was seen in office by Dr. Myna Hidalgo on 07/04/23 on follow up and reported intermittent epistaxis and found to have drop in platelets to 17. He was admitted to [facility] the same day and started on high dose decadron X 4days, IVIG X 2 days and nplate per input from Dr. Myna Hidalgo. Did require nasal packing for nose bleed which was covered with Augmentin and has resolved. Xarelto placed on hold briefly and resumed yesterday. Elevated BS managed with SSI. Platelets have improved to 302 and rise in WBC to 20 felt to be reactive in nature.  Retuxin considered outpatient.  Pt reports he had shingles pain R upper back and in his hand. This has improved now 1/10 intensity.    ST consulted for evaluation of swallow and recommended aspiration precautions and chin tuck with liquids due to hx of penetration/tendency to "get strangled". PT/OT consulted and patient was noted to be debilitated. He requires min to max assist with ADLS and noted to have significant balance deficits with mobility due to weakness. Marland Kitchen PTA was independent with use of HW and occasionally needed some assistance by his wife. CIR recommended due to functional decline.  Patient transferred to CIR on 07/09/2023 .    Patient currently requires mod  with basic self-care skills and functional transfers  secondary to muscle weakness, decreased cardiorespiratoy endurance, and decreased standing balance, decreased postural control, and decreased balance strategies.  Prior to hospitalization, patient could complete ADLs with modified independent .  Patient will benefit from skilled intervention to decrease level of assist with basic self-care skills and increase independence with basic self-care skills prior to discharge home with care partner.  Anticipate patient will require  intermittent supervision and follow up outpatient.  OT - End of Session Activity Tolerance: Tolerates 30+ min activity with multiple rests Endurance Deficit: Yes Endurance Deficit Description: fatigued after ambulating OT Assessment Rehab Potential (ACUTE ONLY): Good OT Patient demonstrates impairments in the following area(s): Balance;Edema;Endurance;Motor;Perception;Safety;Sensory OT Basic ADL's Functional Problem(s): Grooming;Bathing;Dressing;Toileting OT Transfers Functional Problem(s): Toilet;Tub/Shower OT Additional Impairment(s): None OT Plan OT Intensity: Minimum of 1-2 x/day, 45 to 90 minutes OT Frequency: 5 out of 7 days OT Duration/Estimated Length of Stay: ~10-12 DAYS OT Treatment/Interventions: Balance/vestibular training;Disease mangement/prevention;Neuromuscular re-education;Self Care/advanced ADL retraining;Therapeutic Exercise;Cognitive remediation/compensation;DME/adaptive equipment instruction;Pain management;Skin care/wound managment;UE/LE Strength taining/ROM;Wheelchair propulsion/positioning;Community reintegration;Functional electrical stimulation;Patient/family education;Splinting/orthotics;UE/LE Coordination activities;Discharge planning;Functional mobility training;Psychosocial support;Therapeutic Activities OT Self Feeding Anticipated Outcome(s): n/a OT Basic Self-Care Anticipated Outcome(s): supervision OT Toileting Anticipated Outcome(s): supervision OT Bathroom Transfers Anticipated Outcome(s): supervision OT Recommendation Recommendations for Other Services: Neuropsych consult Patient destination: Home Follow Up Recommendations: None Equipment Recommended: To be determined   OT Evaluation Precautions/Restrictions  Precautions Precautions: Fall Precaution/Restrictions Comments: Left hemiplegia CVA prior to admit Restrictions Weight Bearing Restrictions Per Provider Order: No General Chart Reviewed: Yes Family/Caregiver Present: No Vital  Signs Therapy Vitals Temp: 99 F (37.2 C) Pulse Rate: 82 Resp: 17 BP: 110/78 Patient Position (if appropriate): Sitting Oxygen Therapy SpO2: 98 % O2 Device: Room Air Pain   Home Living/Prior Functioning Home Living Family/patient expects to be discharged to:: Private residence Living Arrangements: Spouse/significant other Available Help at Discharge: Family, Available 24 hours/day Type of Home: House Home Access: Stairs to enter [employer] of Steps: 5 STE but working on getting ramp installed Entrance Stairs-Rails: Left, Right, Can reach both Home Layout: One level Bathroom Shower/Tub: Engineer, manufacturing systems: Handicapped height Bathroom Accessibility: Yes Additional Comments: pt reports being ambulatory for household distances using hemi walker. Pt has L PFRW, WC, shower chair/bench, bedside commode. Pt reports being independnet with cathing at home prior to admission.  Lives With: Spouse Prior Function Level of Independence: Requires assistive device for independence  Able to Take Stairs?: No Driving: Yes Vision Baseline Vision/History: 0 No visual deficits Ability to See in Adequate Light: 0 Adequate Patient Visual Report: No change from baseline Vision Assessment?: No apparent visual deficits Additional Comments: has reading cheating glasses Perception  Perception: Within Functional Limits Praxis Praxis: Impaired Praxis Impairment Details: Motor planning Praxis-Other Comments: PTA Cognition Cognition Overall Cognitive Status: Within Functional Limits for tasks assessed Arousal/Alertness: Awake/alert Orientation Level: Person;Place;Situation Person: Oriented Place: Oriented Situation: Oriented Memory: Impaired Attention: Sustained Sustained Attention: Appears intact Awareness: Impaired Awareness Impairment: Anticipatory impairment Problem Solving: Appears intact Safety/Judgment: Appears intact Brief Interview for Mental Status  (BIMS) Repetition of Three Words (First Attempt): 3 Temporal Orientation: Year: Correct Temporal Orientation: Month: Accurate within 5 days Temporal Orientation: Day: Correct Recall: "Sock": Yes, no cue required Recall: "Blue": Yes, no cue required Recall: "Bed": Yes, no cue required BIMS Summary Score: 15 Sensation Sensation Light Touch: Impaired Detail Light Touch Impaired Details: Impaired RLE;Impaired LLE Proprioception: Impaired Detail Proprioception Impaired Details: Impaired LLE Additional Comments: decreased sensation along LLE, absent sensation along bilateral great toes.  plantarflexion and inversion contracture/tone in L ankle. Coordination Gross Motor Movements are Fluid and Coordinated: No Fine Motor Movements are Fluid and Coordinated: No Coordination and Movement Description: grossly uncoordinated due to L hemiparesis, tone in LUE/LLE, and decreased balance/coordination Finger Nose Finger Test: unable to perform on LUE due to prior CVA Motor  Motor Motor: Hemiplegia;Abnormal tone Motor - Skilled Clinical Observations: L hemiparesis and tone in LUE/LLE due to prior CVA- now generalized weakness  Trunk/Postural Assessment  Cervical Assessment Cervical Assessment: Exceptions to [facility] (forward head and slightly turned to the right) Thoracic Assessment Thoracic Assessment:  (rounded shoulders) Lumbar Assessment Lumbar Assessment:  (posterior pelvic tilt) Postural Control Postural Control: Deficits on evaluation Righting Reactions: delayed on L Protective Responses: delayed on L  Balance Balance Balance Assessed: Yes Static Sitting Balance Static Sitting - Balance Support: Feet supported;Right upper extremity supported Static Sitting - Level of Assistance: 5: Stand by assistance Dynamic Sitting Balance Dynamic Sitting - Balance Support: Feet supported;No upper extremity supported Dynamic Sitting - Level of Assistance: 5: Stand by assistance Static Standing  Balance Static Standing - Balance Support: Right upper extremity supported;During functional activity Static Standing - Level of Assistance: 4: Min assist Dynamic Standing Balance Dynamic Standing - Balance Support: Right upper extremity supported;During functional activity Dynamic Standing - Level of Assistance: 3: Mod assist Dynamic Standing - Comments: with transfers and gait Extremity/Trunk Assessment RUE Assessment RUE Assessment: Within Functional Limits LUE Assessment LUE Assessment: Exceptions to [facility] General Strength Comments: 0/5 Deltoid, 0/5 Biceps, 1-2/5 Triceps, 1/5 Grip LUE Tone LUE Tone: Mild;Modified Ashworth Body Part - Modified Ashworth Scale: Elbow;Wrist;Fingers;Thumb Wrist - Modified Ashworth Scale for Grading Hypertonia LUE: Slight increase in muscle tone, manifested by a catch and release or by minimal resistance at the end of the range of motion when the affected part(s) is moved in flexion or extension Fingers - Modified Ashworth Scale for Grading Hypertonia LUE: More marked increase in muscle tone through most of the ROM, but affected part(s) easily moved Thumb - Modified Ashworth Scale for Grading Hypertonia LUE: More marked increase in muscle tone through most of the ROM, but affected part(s) easily moved  Care Tool Care Tool Self Care Eating   Eating Assist Level: Minimal Assistance - Patient > 75%    Oral Care    Oral Care Assist Level: Contact Guard/Toucning assist    Bathing   Body parts bathed by patient: Chest;Abdomen;Front perineal area;Right upper leg;Left upper leg;Right lower leg;Left lower leg;Face Body parts bathed by helper: Right arm;Left arm;Buttocks   Assist Level: Moderate Assistance - Patient 50 - 74%    Upper Body Dressing(including orthotics)   What is the patient wearing?: Pull over shirt   Assist Level: Moderate Assistance - Patient 50 - 74%    Lower Body Dressing (excluding footwear)   What is the patient wearing?:  Underwear/pull up;Pants Assist for lower body dressing: Minimal Assistance - Patient > 75%    Putting on/Taking off footwear   What is the patient wearing?: Non-skid slipper socks Assist for footwear: Minimal Assistance - Patient > 75%       Care Tool Toileting Toileting activity   Assist for toileting: Maximal Assistance - Patient 25 - 49%     Care Tool Bed Mobility Roll left and right activity        Sit to lying activity        Lying to sitting on side of bed activity         Care Tool Transfers Sit to  stand transfer   Sit to stand assist level: Moderate Assistance - Patient 50 - 74%    Chair/bed transfer   Chair/bed transfer assist level: Minimal Assistance - Patient > 75%     Toilet transfer   Assist Level: Minimal Assistance - Patient > 75%     Care Tool Cognition  Expression of Ideas and Wants Expression of Ideas and Wants: 4. Without difficulty (complex and basic) - expresses complex messages without difficulty and with speech that is clear and easy to understand  Understanding Verbal and Non-Verbal Content Understanding Verbal and Non-Verbal Content: 4. Understands (complex and basic) - clear comprehension without cues or repetitions   Memory/Recall Ability Memory/Recall Ability : Current season;That he or she is in a hospital/hospital unit   Refer to Care Plan for Long Term Goals  SHORT TERM GOAL WEEK 1 OT Short Term Goal 1 (Week 1): Pt will perform tub bench transfer into bathtub with contact guard with cues OT Short Term Goal 2 (Week 1): Pt will don shirt with supervision OT Short Term Goal 3 (Week 1): Pt will be able to stand with contact guard for 3 minutes in prep for cathing in standing position  Recommendations for other services: Neuropsych   Skilled Therapeutic Intervention 1:1 OT eval initated with OT purpose, role and goals discussed. Pt received in the w/c. Self care retraining at shower level. Pt able to ambulate into the bathroom with hemi  walker with min A with facilitation for proper left LE placement prior to standing. Discussion about he had a brace previously and bringing it in with a pair of shoes for increased safety of foot/ankle. Pt shower all parts except for buttocks and UE requiring A. Pt feels his left Ue hemiplegia is the same as before. He reports his endurance and balance is his most prominent  deficit. Pt dressed sit to stand. Practiced self cathing with Rn trying to maintain standing position. Pt needed to sit 3 times during the course of task but was able to insert cath himself as well as clean himself.  Pt left sitting up in the chair with call bell at his side.   ADL ADL Upper Body Bathing: Minimal assistance Where Assessed-Upper Body Bathing: Shower Lower Body Bathing: Minimal assistance Where Assessed-Lower Body Bathing: Shower Upper Body Dressing: Moderate assistance Where Assessed-Upper Body Dressing: Sitting at sink Lower Body Dressing: Minimal assistance Where Assessed-Lower Body Dressing: Sitting at sink Toileting: Maximal assistance Toilet Transfer: Minimal assistance Tub/Shower Equipment: Biochemist, clinical Transfer: Minimal assistance Mobility  Bed Mobility Bed Mobility: Rolling Right;Supine to Sit Rolling Right: Contact Guard/Touching assist Supine to Sit: Contact Guard/Touching assist Transfers Sit to Stand: Minimal Assistance - Patient > 75% Stand to Sit: Minimal Assistance - Patient > 75%   Discharge Criteria: Patient will be discharged from OT if patient refuses treatment 3 consecutive times without medical reason, if treatment goals not met, if there is a change in medical status, if patient makes no progress towards goals or if patient is discharged from hospital.  The above assessment, treatment plan, treatment alternatives and goals were discussed and mutually agreed upon: by patient  Adan Sis 07/10/2023, 3:47 PM

## 2023-07-10 NOTE — Plan of Care (Signed)
  Problem: RH Balance Goal: LTG Patient will maintain dynamic standing with ADLs (OT) Description: LTG:  Patient will maintain dynamic standing balance with assist during activities of daily living (OT)  Flowsheets (Taken 07/10/2023 1537) LTG: Pt will maintain dynamic standing balance during ADLs with: Independent with assistive device   Problem: Sit to Stand Goal: LTG:  Patient will perform sit to stand in prep for activites of daily living with assistance level (OT) Description: LTG:  Patient will perform sit to stand in prep for activites of daily living with assistance level (OT) Flowsheets (Taken 07/10/2023 1537) LTG: PT will perform sit to stand in prep for activites of daily living with assistance level: Supervision/Verbal cueing   Problem: RH Bathing Goal: LTG Patient will bathe all body parts with assist levels (OT) Description: LTG: Patient will bathe all body parts with assist levels (OT) Flowsheets (Taken 07/10/2023 1537) LTG: Pt will perform bathing with assistance level/cueing: Supervision/Verbal cueing   Problem: RH Dressing Goal: LTG Patient will perform upper body dressing (OT) Description: LTG Patient will perform upper body dressing with assist, with/without cues (OT). Flowsheets (Taken 07/10/2023 1537) LTG: Pt will perform upper body dressing with assistance level of: Independent with assistive device Goal: LTG Patient will perform lower body dressing w/assist (OT) Description: LTG: Patient will perform lower body dressing with assist, with/without cues in positioning using equipment (OT) Flowsheets (Taken 07/10/2023 1537) LTG: Pt will perform lower body dressing with assistance level of: Supervision/Verbal cueing   Problem: RH Toileting Goal: LTG Patient will perform toileting task (3/3 steps) with assistance level (OT) Description: LTG: Patient will perform toileting task (3/3 steps) with assistance level (OT)  Flowsheets (Taken 07/10/2023 1537) LTG: Pt will perform  toileting task (3/3 steps) with assistance level: Supervision/Verbal cueing   Problem: RH Toilet Transfers Goal: LTG Patient will perform toilet transfers w/assist (OT) Description: LTG: Patient will perform toilet transfers with assist, with/without cues using equipment (OT) Flowsheets (Taken 07/10/2023 1537) LTG: Pt will perform toilet transfers with assistance level of: Supervision/Verbal cueing   Problem: RH Tub/Shower Transfers Goal: LTG Patient will perform tub/shower transfers w/assist (OT) Description: LTG: Patient will perform tub/shower transfers with assist, with/without cues using equipment (OT) Flowsheets (Taken 07/10/2023 1537) LTG: Pt will perform tub/shower stall transfers with assistance level of: Supervision/Verbal cueing

## 2023-07-10 NOTE — Progress Notes (Addendum)
 Called to room by patient. Patient requesting in and out cath to be performed early. Patient complains of lower abdominal pressure and discomfort. Patient bladder scanned for 575 ml. In and out cath performed. In and out cath amount was 975 ml. Patient no longer complains of abdominal pressure and discomfort at this time. Provider to be notified.

## 2023-07-10 NOTE — Progress Notes (Signed)
 [facility] Individual Statement of Services  Patient Name:  Mark [name]  Date:  07/10/2023  Welcome to the [facility].  Our goal is to provide you with an individualized program based on your diagnosis and situation, designed to meet your specific needs.  With this comprehensive rehabilitation program, you will be expected to participate in at least 3 hours of rehabilitation therapies Monday-Friday, with modified therapy programming on the weekends.  Your rehabilitation program will include the following services:  Physical Therapy (PT), Occupational Therapy (OT), Speech Therapy (ST), 24 hour per day rehabilitation nursing, Therapeutic Recreaction (TR), Neuropsychology, Care Coordinator, Rehabilitation Medicine, Nutrition Services, and Pharmacy Services  Weekly team conferences will be held on Wednesday to discuss your progress.  Your Inpatient Rehabilitation Care Coordinator will talk with you frequently to get your input and to update you on team discussions.  Team conferences with you and your family in attendance may also be held.  Expected length of stay: 10-12 days  Overall anticipated outcome: supervision with cues  Depending on your progress and recovery, your program may change. Your Inpatient Rehabilitation Care Coordinator will coordinate services and will keep you informed of any changes. Your Inpatient Rehabilitation Care Coordinator's name and contact numbers are listed  below.  The following services may also be recommended but are not provided by the In[facility]:   Home Health Rehabiltiation Services Outpatient Rehabilitation Services    Arrangements will be made to provide these services after discharge if needed.  Arrangements include referral to agencies that provide these services.  Your insurance has been verified to be:  [employer] Your primary doctor is:  Laurann [place]  Pertinent information will be  shared with your doctor and your insurance company.  Inpatient Rehabilitation Care Coordinator:  Dossie Der, [place] [phone] or Luna Glasgow  Information discussed with and copy given to patient by: Lucy Chris, 07/10/2023, 11:47 AM

## 2023-07-10 NOTE — Evaluation (Signed)
 Speech Language Pathology Assessment and Plan  Patient Details  Name: CHRISTINE SCHIEFELBEIN MRN: [id number] Date of Birth: [birth year]  Today's Date: 07/10/2023 SLP Individual Time: 1000-1058 SLP Individual Time Calculation (min): 58 min   Hospital Problem: Principal Problem:   Debility  Past Medical History:  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    Lupus anticoagulant disorder (HCC)    Lupus anticoagulant syndrome (HCC)    Multiple closed anterior-posterior compression fractures of pelvis (HCC)    Seizures (HCC)    last seizure 10 years ago approx    Sleep apnea    cpap broken x 1 year per wife    Stroke (HCC) 02/27/2011   Left side weakness   Past Surgical History:  Past Surgical History:  Procedure Laterality Date   blood clot Right    surgical removal   CHOLECYSTECTOMY     CORONARY ANGIOPLASTY  1992   CYSTOGRAM N/A 02/17/2020   Procedure: CYSTOGRAM;  Surgeon: Crist Fat, MD;  Location: [facility];  Service: Urology;  Laterality: N/A;   CYSTOSCOPY WITH RETROGRADE PYELOGRAM, URETEROSCOPY AND STENT PLACEMENT Bilateral 02/17/2020   Procedure: Effie Shy  URETEROSCOPY AND STENT PLACEMENT;  Surgeon: Crist Fat, MD;  Location: [facility];  Service: Urology;  Laterality: Bilateral;   SPLENECTOMY, TOTAL     vertebralplasty      Assessment / Plan / Recommendation Clinical Impression HPI: Pt is a 78 year old male with medical hx significant for: ITP, 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 Pt presented to  [facility] on 07/04/23 d/t thrombocytopenia. Pt had significant petechia over the last 24 hours prior to presentation. Hematologist recommended he go to ER for admission for IVIG.    Pt recently admitted to hospital and treated with 4 days of high-dose Decadron. Treatment recommendation: dose of Nplate, Decadron 40 mg IV daily for 3 days and IVIG for 3 doses. Received on unit platelet on 3/22 after an episode of epistaxis. Nasal packing and given Augmentin.    History of CVA, lupus anticoagulant positive, history of DVT., Xarelto on Hold and ASA due to high risk for bleeding in the setting of severe thrombocytopenia. Will resume with platelet count more than 100,000.Continue amlodipine for essential hypertension. Keppra for history of seizures. Hyperglycemia noted in setting of steroids. SSI.  Clinical Impression:  Bedside Swallow Evaluation: A bedside swallow evaluation was completed to assess for s/sx of oropharyngeal dysphagia. Oral mechanism exam revealed reduced lingual ROM, though functional for swallowing and likely a result from prior CVA. POs administered included thin liquids, puree and solids. Patient with timely mastication, adequate oral clearance and no s/sx of aspiration. MBS done in 2018 recommended use of chin tuck with swallowing due to penetration, though patient reports occasional use. Patient reports improvement in swallowing post CVA. Recommend continuation of current diet (regular/thin) with medications whole in puree (per patient preference). Patient to continue with use of standardized swallowing precautions. SLP to sign off on swallowing.  Cognitive-Linguistic: Patient was evaluated via the Cognistat to assess cognitive-linguistic skills. Patient scored WFL on all subtests despite mild-moderate visual spatial skills as he required increased time than what test allows. Patient with adequate short and long term memory, intellectual awareness, attention, and  receptive/expressive language per standardized and informal evaluation. Patient reports wife is responsible for higher level cognitive tasks and he reports no change in cognition since recent admission. No further ST needs as patient is likely at baseline level of functioning. Pt does not need any further ST services at this time as he is at baseline level of functioning. Please re-consult if change in status occurs.     Skilled Therapeutic Interventions          Patient evaluated using a standardized cognitive linguistic assessment and bedside swallow evaluation to assess current cognitive, communicative and swallowing function. See above for details.    SLP Assessment  Patient does not need any further Speech Lanaguage Pathology Services    Recommendations  SLP Diet Recommendations: Age appropriate regular solids;Thin Liquid Administration via: Straw;Cup Medication Administration: Whole meds with puree Supervision: Patient able to self feed Compensations: Slow rate;Small sips/bites Postural Changes and/or Swallow Maneuvers: Seated upright 90 degrees Patient destination: Home Follow up Recommendations: None Equipment Recommended: None recommended by SLP     Pain None reported  SLP Evaluation Cognition Overall Cognitive Status: Within Functional Limits for tasks assessed Arousal/Alertness: Awake/alert Orientation Level: Oriented X4 Year: 2025 Month: March Day of Week: Correct Attention: Sustained Sustained Attention: Appears intact Memory: Appears intact Awareness: Impaired Awareness Impairment: Emergent impairment Problem Solving: Appears intact Safety/Judgment: Appears intact  Comprehension Auditory Comprehension Overall Auditory Comprehension: Appears within functional limits for tasks assessed Expression Expression Primary Mode of Expression: Verbal Verbal Expression Overall Verbal Expression: Appears within functional limits for tasks assessed Oral Motor Oral  Motor/Sensory Function Overall Oral Motor/Sensory Function: Mild impairment Facial ROM: Within Functional Limits Facial Symmetry: Within Functional Limits Facial Strength: Within Functional Limits Lingual ROM: Reduced left Lingual Symmetry: Within Functional Limits Lingual Strength: Within Functional Limits Velum: Within Functional Limits Mandible: Within Functional Limits Motor Speech Overall Motor Speech: Appears within functional limits for tasks assessed  Care Tool Care Tool Cognition Ability to hear (with hearing aid or hearing appliances if normally used Ability to hear (with hearing aid or hearing appliances if normally used): 1. Minimal difficulty - difficulty in some environments (e.g. when person speaks softly or setting is noisy)   Expression of Ideas and Wants Expression of Ideas and Wants: 4. Without difficulty (complex and basic) - expresses complex messages without difficulty and with speech that is clear and easy to understand   Understanding Verbal and Non-Verbal Content Understanding Verbal and Non-Verbal Content: 4. Understands (complex and basic) - clear comprehension without cues or repetitions  Memory/Recall Ability Memory/Recall Ability : Current season;That he or she is in a hospital/hospital unit    Bedside Swallowing Assessment General Previous Swallow Assessment: none Diet Prior to this Study: Regular;Thin liquids (Level 0) Respiratory Status: Room air Behavior/Cognition: Alert;Cooperative Self-Feeding Abilities: Able to feed self Patient Positioning: Upright in chair/Tumbleform Baseline Vocal Quality: Normal Volitional Cough: Strong Volitional Swallow: Able to elicit  Ice Chips Ice chips: Not tested Thin Liquid Thin Liquid: Within functional limits Presentation: Self Fed;Straw;Cup Nectar Thick Nectar Thick Liquid: Not tested Honey Thick Honey Thick Liquid: Not tested Puree Puree: Within functional limits Presentation: Self  Fed;Spoon Solid Solid: Within functional limits Presentation: Self Fed BSE Assessment Risk for Aspiration Impact on safety and function: Mild aspiration  risk Other Related Risk Factors: Previous CVA  Refer to Care Plan for Long Term Goals  Recommendations for other services: None   Discharge Criteria: Patient will be discharged from SLP if patient refuses treatment 3 consecutive times without medical reason, if treatment goals not met, if there is a change in medical status, if patient makes no progress towards goals or if patient is discharged from hospital.  The above assessment, treatment plan, treatment alternatives and goals were discussed and mutually agreed upon: by patient  Jowell Bossi M.A., CF-SLP 07/10/2023, 12:18 PM

## 2023-07-10 NOTE — Progress Notes (Signed)
 Encouraged pt to drink more fluids   07/10/23 1400  Unmeasured Output  Urine Occurrence 0  Urine Characteristics  Urinary Incontinence No  Urine Color Amber  Urine Appearance Clear  Urinary Interventions Intermittent/Straight cath  Intermittent/Straight Cath (mL) 200 mL  Hygiene Peri care

## 2023-07-11 ENCOUNTER — Inpatient Hospital Stay

## 2023-07-11 LAB — COMPREHENSIVE METABOLIC PANEL WITH GFR
ALT: 56 U/L — ABNORMAL HIGH (ref 0–44)
AST: 34 U/L (ref 15–41)
Albumin: 2.7 g/dL — ABNORMAL LOW (ref 3.5–5.0)
Alkaline Phosphatase: 46 U/L (ref 38–126)
Anion gap: 9 (ref 5–15)
BUN: 37 mg/dL — ABNORMAL HIGH (ref 8–23)
CO2: 25 mmol/L (ref 22–32)
Calcium: 8.8 mg/dL — ABNORMAL LOW (ref 8.9–10.3)
Chloride: 104 mmol/L (ref 98–111)
Creatinine, Ser: 1.5 mg/dL — ABNORMAL HIGH (ref 0.61–1.24)
GFR, Estimated: 47 mL/min — ABNORMAL LOW (ref >60)
Glucose, Bld: 120 mg/dL — ABNORMAL HIGH (ref 70–99)
Potassium: 4.2 mmol/L (ref 3.5–5.1)
Sodium: 138 mmol/L (ref 135–145)
Total Bilirubin: 0.6 mg/dL (ref 0.0–1.2)
Total Protein: 7.3 g/dL (ref 6.5–8.1)

## 2023-07-11 LAB — CBC WITH DIFFERENTIAL/PLATELET
Abs Immature Granulocytes: 0.65 10*3/uL — ABNORMAL HIGH (ref 0.00–0.07)
Basophils Absolute: 0.2 10*3/uL — ABNORMAL HIGH (ref 0.0–0.1)
Basophils Relative: 1 %
Eosinophils Absolute: 0.7 10*3/uL — ABNORMAL HIGH (ref 0.0–0.5)
Eosinophils Relative: 4 %
HCT: 36.4 % — ABNORMAL LOW (ref 39.0–52.0)
Hemoglobin: 11.8 g/dL — ABNORMAL LOW (ref 13.0–17.0)
Immature Granulocytes: 4 %
Lymphocytes Relative: 26 %
Lymphs Abs: 4.3 10*3/uL — ABNORMAL HIGH (ref 0.7–4.0)
MCH: 30.1 pg (ref 26.0–34.0)
MCHC: 32.4 g/dL (ref 30.0–36.0)
MCV: 92.9 fL (ref 80.0–100.0)
Monocytes Absolute: 2.3 10*3/uL — ABNORMAL HIGH (ref 0.1–1.0)
Monocytes Relative: 14 %
Neutro Abs: 8.3 10*3/uL — ABNORMAL HIGH (ref 1.7–7.7)
Neutrophils Relative %: 51 %
Platelets: 602 10*3/uL — ABNORMAL HIGH (ref 150–400)
RBC: 3.92 MIL/uL — ABNORMAL LOW (ref 4.22–5.81)
RDW: 18.3 % — ABNORMAL HIGH (ref 11.5–15.5)
WBC: 16.3 10*3/uL — ABNORMAL HIGH (ref 4.0–10.5)
nRBC: 2.6 % — ABNORMAL HIGH (ref 0.0–0.2)

## 2023-07-11 MED ORDER — ACETAMINOPHEN 325 MG PO TABS
650.0000 mg | ORAL_TABLET | Freq: Four times a day (QID) | ORAL | Status: DC | PRN
Start: 2023-07-11 — End: 2023-07-22
  Administered 2023-07-15 – 2023-07-20 (×3): 650 mg via ORAL
  Filled 2023-07-11 (×3): qty 2

## 2023-07-11 MED ORDER — VITAMIN D 25 MCG (1000 UNIT) PO TABS
2000.0000 [IU] | ORAL_TABLET | Freq: Every day | ORAL | Status: DC
  Administered 2023-07-12 – 2023-07-22 (×11): 2000 [IU] via ORAL
  Filled 2023-07-11 (×12): qty 2

## 2023-07-11 NOTE — Progress Notes (Signed)
 PROGRESS NOTE   Subjective/Complaints: C/o low back pain 3/10, kpad ordered, discussed that we can add back prn tylenol since liver enzymes have improved but to use sparingly  ROS: chronic right foot neuropathy, +chronic low back pain   Objective:   No results found. Recent Labs    07/10/23 0554 07/11/23 0657  WBC 14.8* 16.3*  HGB 12.1* 11.8*  HCT 37.1* 36.4*  PLT 465* 602*   Recent Labs    07/10/23 0554 07/11/23 0657  NA 139 138  K 4.3 4.2  CL 108 104  CO2 24 25  GLUCOSE 107* 120*  BUN 33* 37*  CREATININE 1.27* 1.50*  CALCIUM 8.5* 8.8*    Intake/Output Summary (Last 24 hours) at 07/11/2023 1157 Last data filed at 07/11/2023 0900 Gross per 24 hour  Intake 1180 ml  Output 2851 ml  Net -1671 ml        Physical Exam: Vital Signs Blood pressure 113/70, pulse 66, temperature 98 F (36.7 C), resp. rate 18, height 5\' 11"  (1.803 m), weight 84.6 kg, SpO2 96%. Gen: no distress, normal appearing HEENT: oral mucosa pink and moist, NCAT Cardio: Reg rate Chest: normal effort, normal rate of breathing Abd: soft, non-distended Ext: no edema Psych: pleasant, normal affect Skin: intact Neuro:     Mental Status: AAOx3, long term memory deficits Speech/Languate: Naming and repetition intact, fluent, follows simple commands CRANIAL NERVES: 2-12 grossly intact     MOTOR: RUE: 5/5 Deltoid, 5/5 Biceps, 5/5 Triceps,5/5 Grip LUE: 0/5 Deltoid, 0/5 Biceps, 1-2/5 Triceps, 1/5 Grip RLE: HF 5/5, KE 5/5, ADF 5/5, APF 5/5 LLE: HF 4-/5, KE 4-/5, ADF 2-/5, APF 4-/5, stable 3/28   SENSORY: Altered to LT Lue and LLE   MSK: Increased tone LUE with hand in fist and increased elbow extensor tone Decreased dorsiflexion ROM LLE  Atrophy R hand intrinsic muscles    Assessment/Plan: 1. Functional deficits which require 3+ hours per day of interdisciplinary therapy in a comprehensive inpatient rehab setting. Physiatrist is  providing close team supervision and 24 hour management of active medical problems listed below. Physiatrist and rehab team continue to assess barriers to discharge/monitor patient progress toward functional and medical goals  Care Tool:  Bathing    Body parts bathed by patient: Chest, Abdomen, Front perineal area, Right upper leg, Left upper leg, Right lower leg, Left lower leg, Face   Body parts bathed by helper: Right arm, Left arm, Buttocks     Bathing assist Assist Level: Moderate Assistance - Patient 50 - 74%     Upper Body Dressing/Undressing Upper body dressing   What is the patient wearing?: Pull over shirt    Upper body assist Assist Level: Moderate Assistance - Patient 50 - 74%    Lower Body Dressing/Undressing Lower body dressing      What is the patient wearing?: Underwear/pull up, Pants     Lower body assist Assist for lower body dressing: Minimal Assistance - Patient > 75%     Toileting Toileting    Toileting assist Assist for toileting: Maximal Assistance - Patient 25 - 49%     Transfers Chair/bed transfer  Transfers assist     Chair/bed transfer assist  level: Minimal Assistance - Patient > 75%     Locomotion Ambulation   Ambulation assist      Assist level: Minimal Assistance - Patient > 75% Assistive device: Walker-hemi Max distance: 18ft   Walk 10 feet activity   Assist     Assist level: Minimal Assistance - Patient > 75% Assistive device: Walker-hemi   Walk 50 feet activity   Assist    Assist level: Minimal Assistance - Patient > 75% Assistive device: Walker-hemi    Walk 150 feet activity   Assist Walk 150 feet activity did not occur: Safety/medical concerns (fatigue)         Walk 10 feet on uneven surface  activity   Assist Walk 10 feet on uneven surfaces activity did not occur: Safety/medical concerns (fatigue)         Wheelchair     Assist Is the patient using a wheelchair?: Yes Type of  Wheelchair: Manual    Wheelchair assist level: Dependent - Patient 0% Max wheelchair distance: 117ft    Wheelchair 50 feet with 2 turns activity    Assist        Assist Level: Dependent - Patient 0%   Wheelchair 150 feet activity     Assist      Assist Level: Dependent - Patient 0%   Blood pressure 113/70, pulse 66, temperature 98 F (36.7 C), resp. rate 18, height 5\' 11"  (1.803 m), weight 84.6 kg, SpO2 96%.  Medical Problem List and Plan: 1. Functional deficits secondary to debility due to ITP             -patient may  shower             -ELOS/Goals: 7-10 days, Sup to Min a PT/OT/SLP             -Admit to CIR 2.  Antithrombotics: -DVT/anticoagulation:  Pharmaceutical: Xarelto             -antiplatelet therapy: ASA 3.Right foot neuropathic pain: scheduled for outpatient Qutenza. Tylenol prn. Continue gabapentin. Continue Norco PRN.  4. Mood/Behavior/Sleep: LCSW to follow for evaluation and support.              -antipsychotic agents: N/A 5. Neuropsych/cognition: This patient is capable of making decisions on his own behalf. 6. Skin/Wound Care: Routine pressure relief measures.  7. Fluids/Electrolytes/Nutrition: Strict I/O. Daily CMET per hem/Onc 8.  Chronic ITP w/relapse: Has been received Decadron, IVIG and Nplate             --plans for rituxan on outpatient basis per Dr. Myna Hidalgo             --continue daily CMET/CBC X 4 days per input from Dr. Myna Hidalgo.  9. Epistaxis: Nasal packing--augmentin X 3 d/c on 03/25. 10 .Prediabetes: Hgb A1C- 6.2. Elevated due to steriods but trending down. --Continue to monitor BS ac/hs and use SSI for elevated BS 11. H/o CVA X 2 w/neurogenic bladder: On Xarelto and ASA.             --At baseline completes intermittent self cath several times a day.  12.Seizure d/o: Seizure free on Keppra 1500 mg bid 13. Anxiety/depression: On Buropion and Sertraline 14. CKD 3A: BUN/SCr 24/1.3--> Baseline SCr 1.3-->40/1.17. Avoid nephrotoxic  medications. Repeat tomorrow since trending upward .  16. Leucocytosis: May be steroid related. WBC reviewed and is stable, repeat tomorrow  17. Essential HTN. Monitor BP. Continue norvasc, add on magnesium level  18. Spasticity. Continue baclofen  19. Suboptimal vitamin D: D3  ordered, increase to 2,000U daily  20. Transaminitis: improved, prn tylenol added back       LOS: 2 days A FACE TO FACE EVALUATION WAS PERFORMED  Mark [name] 07/11/2023, 11:57 AM

## 2023-07-11 NOTE — Progress Notes (Signed)
 Occupational Therapy Session Note  Patient Details  Name: Mark [name] MRN: [id number] Date of Birth: [birth year]  Today's Date: 07/11/2023 OT Individual Time: 3086-5784 & 6962-9528 OT Individual Time Calculation (min): 97 min & 89 min   Short Term Goals: Week 1:  OT Short Term Goal 1 (Week 1): Pt will perform tub bench transfer into bathtub with contact guard with cues OT Short Term Goal 2 (Week 1): Pt will don shirt with supervision OT Short Term Goal 3 (Week 1): Pt will be able to stand with contact guard for 3 minutes in prep for cathing in standing position  Skilled Therapeutic Interventions/Progress Updates:  Session 1 Skilled OT intervention completed with focus on ADL retraining, functional mobility, sit > stands, adaptive equipment education. Pt received semi upright in bed, agreeable to session. No pain reported.  Transitioned to EOB with supervision and increased time. Noted to use bent L hemi wrist on bed to scoot to EOB with mild inattention. L AFO already donned, required assist to donn R shoe. CGA sit > stand using hemi walker on R, then ambulated with CGA/min A > toilet in bathroom with cues needed for LLE awareness and positioning of hemi walker as pt likes to place far in front of him and in front vs on side. CGA to lower pants. Pt was continent of BM; charted. Able to wipe seated with set up A using RUE.  Upon standing attempt, pt with poor BLE positioning- RLE tucked underneath him, LLE extended, and pt with continued attempt to stand despite cues to return sitting, requiring immediate mod/max A for R sided LOB to safe sitting on commode. On 2nd attempt with BLE placed in proper BOS, pt again with RLE transitioning to tucked position underneath him as if to gain leverage but receptive to termination cues. OT placed foot behind RLE heel to prevent spontaneous knee flexion upon standing, and with mod A, pt stood using RUE on grab bar, then min/mod A needed for dynamic standing  balance during transfer of hand from grab bar > hemi walker. Mod A needed for pants management. Min A ambulatory transfer > sink for standing hand hygiene with CGA, then transfer to w/c.  Transported dependently in w/c <> gym. Worked on blocked practice sit > stands with long mirror for visual feedback to address proper BOS with BLE prior to standing. Pt completed 5x5 sit > stands with min A fading to CGA with RUE pushing up on arm rest then transferring to hemi walker. Even with mirror, pt initially needed mod cueing for sequencing and RLE awareness, but with increased repetition, pt able to set up with only supervision. Anticipate pt feels the need to have R foot further underneath for leverage especially from lower heights and we discussed ways gain leverage in a safer way. Advised he use BSC over toilet in bathroom to prevent same occurrence with nursing and safety sheet updated to reflect that need.  Introduced pt to hemi sling for purpose of skin/hemi limb integrity during functional mobility as pt requires RUE to position LUE completely. Donned dependently for demo, then education provided on donn/doff technique. Discussed wear during mobility however at rest doffing but pt preferred to keep on at end of session.   Back in room, pt remained seated in w/c, with belt alarm on/activated, diet soda per request/nursing clearance and with all needs in reach at end of session.  Session 2 Skilled OT intervention completed with focus on DC planning, tub/shower transfers, ambulatory transfers, AE  education, LUE positioning . Pt received seated in w/c, agreeable to session. No pain reported.  Hemi sling already donned on LUE for hemi positioning. Transported pt dependently in w/c > ADL bathroom. Pt reports bathroom set up as tub/shower with curtain and that he already owns TTB but a prior home therapist instructed him to place TTB parallel in shower and sit on it long ways to manage the shower curtain.  Advised pt that this method would be more of a fall risk and is less independent as compared to the use of the TTB as it's intended with 2 legs in and 2 legs out of the tub with tuck method of curtain. Demo provided on technique, then pt was able to stand with min A using hemi walker, ambulate with CGA > TTB and was overall supervision for lifting BLE over tub threshold. Demonstrated method of tucking shower curtain under buttocks to prevent water spillage in floor. Discussed using lateral leans for peri-washing and use of grab bars for balance. Handout issued to pt on TTB that has shower curtain cut out if absolutely desired but not necessary with tuck method.  Min A sit > stand with hemi walker, CGA ambulatory transfer > w/c. Pt practiced w/c mobility using BLE and RUE for 250 ft with supervision and cues needed for coordinating LLE and steering. Pt then stood CGA with hemi walker, and ambulated 50 ft with CGA and 2nd person for w/c follow. Cues provided for upright posture and placement of hemi walker to improve stride with RLE and overall safety.   Seated in w/c, pt doffed hemi sling without assist. Handout issued for hemi sling option to purchase for use at home, as pt has multiple give more slings but does not like them due to difficulty with donning. Education provided on resting hand splint purpose and wear; demo provided and OT placed dependently on LUE for digit/wrist positioning at rest, however required prolonged PROM to digits/wrist due to tone. Discussed contracture prevention and continued wear of splint at DC.  Pt remained seated in w/c, with belt alarm on/activated, and with all needs in reach at end of session.   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

## 2023-07-11 NOTE — Progress Notes (Addendum)
 Physical Therapy Session Note  Patient Details  Name: Mark [name] MRN: [id number] Date of Birth: November 19, 1945  Today's Date: 07/11/2023 PT Individual Time: 0731-0841 PT Individual Time Calculation (min): 70 min   Short Term Goals: Week 1:  PT Short Term Goal 1 (Week 1): pt will transfer sit<>stand with LRAD and CGA PT Short Term Goal 2 (Week 1): pt will transfer bed<>chair with LRAD and CGA PT Short Term Goal 3 (Week 1): pt will ambulate 79ft with LRAD and CGA  Skilled Therapeutic Interventions/Progress Updates:   Received pt semi-reclined in bed, pt agreeable to PT treatment, and denied any pain initially but reported increased low back pain after ambulating. Session with emphasis on functional mobility/transfers, generalized strengthening and endurance, dynamic standing balance/coordination, simulated car transfers, and gait training. Pt transferred supine<>sitting R EOB with HOB elevated and use of bedrails with supervision. Donned shoes sitting EOB with max A and pt performed all transfers with hemi walker and min A throughout session with cues for hemi walker placement. Demonstrated how to don/doff [facility] boot and encouraged pt to wear at nighttime.   Pt transported to/from room in [facility] dependently for time management purposes. Pt performed all stands with hemi walker and min A throughout session. Pt with tendency to tuck R foot underneath WC when standing, requiring mod cues for corrections. Pt performed simulated car transfer with hemi walker and min A with cues for technique. Pt attempting to use RUE to pull L ankle into car but unable to clear frame due to PF and inversion contracture - cues provided to use RUE for support under knee rather than ankle, with pt reporting this is "much easier".   Trialed L PLS AFO and pt ambulated 48ft x 2 trials with hemi walker and min A. Pt ambulates with narrow BOS, decreased R step length, flexed trunk/downward gaze, and with upper thoracic kyphosis. Pt  tends to place hemi walker in front of him rather than on the side, requiring cues for proper placement to allow step through gait pattern with RLE. However, improvements in stride length and foot clearance with L AFO. During rest break discussed fall recovery - educated pt that if he hit hit head or was injured, do NOT try to get up and call 911. If not, demonstrated technique for rolling onto R side, getting into quadruped, crawling to stable piece of furniture, placing hands on furniture, standing, pivoting, and sitting on furniture. However reinforced need to practice this prior to discharge. Returned to room and concluded session with pt sitting in [facility], needs within reach, and seatbelt alarm on. RN notified of request for pain medication for low back.  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

## 2023-07-11 NOTE — Plan of Care (Signed)
  Problem: Consults Goal: RH GENERAL PATIENT EDUCATION Description: See Patient Education module for education specifics. 07/11/2023 1041 by Mylo Red, LPN Outcome: Progressing   Problem: RH BOWEL ELIMINATION Goal: RH STG MANAGE BOWEL WITH ASSISTANCE Description: STG Manage Bowel with  supervision Assistance. 07/11/2023 1041 by Mylo Red, LPN Outcome: Progressing   Problem: RH SKIN INTEGRITY Goal: RH STG SKIN FREE OF INFECTION/BREAKDOWN Description: Manage skin free of infection/breakdown with supervision assistance 07/11/2023 1041 by Mylo Red, LPN Outcome: Progressing   Problem: RH SAFETY Goal: RH STG ADHERE TO SAFETY PRECAUTIONS W/ASSISTANCE/DEVICE Description: STG Adhere to Safety Precautions With  supervision Assistance/Device. 07/11/2023 1041 by Mylo Red, LPN Outcome: Progressing   Problem: RH PAIN MANAGEMENT Goal: RH STG PAIN MANAGED AT OR BELOW PT'S PAIN GOAL Description: <4 w/ prns 07/11/2023 1041 by Mylo Red, LPN Outcome: Progressing   Problem: RH KNOWLEDGE DEFICIT GENERAL Goal: RH STG INCREASE KNOWLEDGE OF SELF CARE AFTER HOSPITALIZATION Description: Manage increase knowledge  of self care after hospitalization with supervision assistance using educational materials provided 07/11/2023 1041 by Mylo Red, LPN Outcome: Progressing   Problem: RH BLADDER ELIMINATION Goal: RH STG MANAGE BLADDER WITH ASSISTANCE Description: STG Manage Bladder With supervision Assistance 07/11/2023 1041 by Mylo Red, LPN Outcome: Not Progressing

## 2023-07-11 NOTE — Progress Notes (Signed)
 Occupational Therapy Session Note  Patient Details  Name: Mark [name] MRN: [id number] Date of Birth: 12/01/1945  Today's Date: 07/12/2023 OT Individual Time: 1005-1045 OT Individual Time Calculation (min): 40 min    Short Term Goals: Week 1:  OT Short Term Goal 1 (Week 1): Pt will perform tub bench transfer into bathtub with contact guard with cues OT Short Term Goal 2 (Week 1): Pt will don shirt with supervision OT Short Term Goal 3 (Week 1): Pt will be able to stand with contact guard for 3 minutes in prep for cathing in standing position  Skilled Therapeutic Interventions/Progress Updates:  Pt received resting in bed, no complaints of pain. Supine>sit EOB with Min A overall, stand-pivots throughout session in similar fashion. Pt propels WC from room>day room using hemi-propulsion technique. Pt then instructed in series of table-top activities targeting midline orientation and weight bearing/shift onto LLE. Multimodal cuing utilized, patient benefiting from use of mirror for visual feedback. All the above completed for translation into standing ADL tasks. Pt unable to progress with functional reaching in dynamic stance without overcompensation at trunk/RLE. Pt remained sitting in WC, all needs within reach.   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

## 2023-07-11 NOTE — Plan of Care (Signed)
  Problem: Consults Goal: RH GENERAL PATIENT EDUCATION Description: See Patient Education module for education specifics. Outcome: Progressing   Problem: RH BOWEL ELIMINATION Goal: RH STG MANAGE BOWEL WITH ASSISTANCE Description: STG Manage Bowel with  supervision Assistance. Outcome: Progressing   Problem: RH BLADDER ELIMINATION Goal: RH STG MANAGE BLADDER WITH ASSISTANCE Description: STG Manage Bladder With supervision Assistance Outcome: Progressing   Problem: RH SKIN INTEGRITY Goal: RH STG SKIN FREE OF INFECTION/BREAKDOWN Description: Manage skin free of infection/breakdown with supervision assistance Outcome: Progressing   Problem: RH SAFETY Goal: RH STG ADHERE TO SAFETY PRECAUTIONS W/ASSISTANCE/DEVICE Description: STG Adhere to Safety Precautions With  supervision Assistance/Device. Outcome: Progressing   Problem: RH PAIN MANAGEMENT Goal: RH STG PAIN MANAGED AT OR BELOW PT'S PAIN GOAL Description: <4 w/ prns Outcome: Progressing   Problem: RH KNOWLEDGE DEFICIT GENERAL Goal: RH STG INCREASE KNOWLEDGE OF SELF CARE AFTER HOSPITALIZATION Description: Manage increase knowledge  of self care after hospitalization with supervision assistance using educational materials provided Outcome: Progressing

## 2023-07-12 LAB — CBC WITH DIFFERENTIAL/PLATELET
Abs Immature Granulocytes: 0.56 10*3/uL — ABNORMAL HIGH (ref 0.00–0.07)
Basophils Absolute: 0.1 10*3/uL (ref 0.0–0.1)
Basophils Relative: 1 %
Eosinophils Absolute: 0.8 10*3/uL — ABNORMAL HIGH (ref 0.0–0.5)
Eosinophils Relative: 5 %
HCT: 34.8 % — ABNORMAL LOW (ref 39.0–52.0)
Hemoglobin: 11.3 g/dL — ABNORMAL LOW (ref 13.0–17.0)
Immature Granulocytes: 4 %
Lymphocytes Relative: 26 %
Lymphs Abs: 4.1 10*3/uL — ABNORMAL HIGH (ref 0.7–4.0)
MCH: 30 pg (ref 26.0–34.0)
MCHC: 32.5 g/dL (ref 30.0–36.0)
MCV: 92.3 fL (ref 80.0–100.0)
Monocytes Absolute: 2.3 10*3/uL — ABNORMAL HIGH (ref 0.1–1.0)
Monocytes Relative: 15 %
Neutro Abs: 7.5 10*3/uL (ref 1.7–7.7)
Neutrophils Relative %: 49 %
Platelets: 729 10*3/uL — ABNORMAL HIGH (ref 150–400)
RBC: 3.77 MIL/uL — ABNORMAL LOW (ref 4.22–5.81)
RDW: 18.2 % — ABNORMAL HIGH (ref 11.5–15.5)
WBC: 15.4 10*3/uL — ABNORMAL HIGH (ref 4.0–10.5)
nRBC: 2.1 % — ABNORMAL HIGH (ref 0.0–0.2)

## 2023-07-12 LAB — COMPREHENSIVE METABOLIC PANEL WITH GFR
ALT: 48 U/L — ABNORMAL HIGH (ref 0–44)
AST: 30 U/L (ref 15–41)
Albumin: 2.7 g/dL — ABNORMAL LOW (ref 3.5–5.0)
Alkaline Phosphatase: 41 U/L (ref 38–126)
Anion gap: 10 (ref 5–15)
BUN: 31 mg/dL — ABNORMAL HIGH (ref 8–23)
CO2: 23 mmol/L (ref 22–32)
Calcium: 8.7 mg/dL — ABNORMAL LOW (ref 8.9–10.3)
Chloride: 106 mmol/L (ref 98–111)
Creatinine, Ser: 1.42 mg/dL — ABNORMAL HIGH (ref 0.61–1.24)
GFR, Estimated: 51 mL/min — ABNORMAL LOW (ref >60)
Glucose, Bld: 118 mg/dL — ABNORMAL HIGH (ref 70–99)
Potassium: 3.6 mmol/L (ref 3.5–5.1)
Sodium: 139 mmol/L (ref 135–145)
Total Bilirubin: 0.6 mg/dL (ref 0.0–1.2)
Total Protein: 6.9 g/dL (ref 6.5–8.1)

## 2023-07-12 NOTE — Progress Notes (Signed)
 Physical Therapy Session Note  Patient Details  Name: Mark [name] MRN: [id number] Date of Birth: 05-30-1945  Today's Date: 07/12/2023 PT Individual Time: [phone] and 1417-1530 PT Individual Time Calculation (min): 75 min and 73 min  Short Term Goals: Week 1:  PT Short Term Goal 1 (Week 1): pt will transfer sit<>stand with LRAD and CGA PT Short Term Goal 2 (Week 1): pt will transfer bed<>chair with LRAD and CGA PT Short Term Goal 3 (Week 1): pt will ambulate 44ft with LRAD and CGA  Skilled Therapeutic Interventions/Progress Updates:   First session:  Pt presents semi-reclined but far down in the bed and agreeable to therapy.  Pt transferred to sitting using siderails and elevated HOB.  Pt required cues for "squaring of" at EOB.  Pt doffed pull-over shirt w/ supervision and then donned same w/ min A to completely pull down.  Pt donned R shoe w/ set-up, max A for L shoe w/ AFO as well as LUE sling.  Pt performed squat pivot transfer to R w/ min A and cueing. Pt  wheeled to dayroom for energy conservation.  Pt transferred to mat table.  Pt performed blocks of partial squats w/ facilitation for LLE WB.  Pt then performed w/ 2" platform under R foot w/ same.  Pt required seated rest breaks for fatigue, O2 sats remained >96% and HR 81.  Pt returned to room and remained sitting in w/ w/ chair alarm on and all needs in reach.   Second session:  Pt presents sitting in w/c and agreeable to therapy.  Pt wheeled self down hallway w/ R hemi technique.  Pt performed SPT w/c > nu-step w/ min A.  Pt tolerated Level 2, 5' x 2 trials w/ occasional repositioning of B feet, LES only.  Pt performed total steps of 327 and averaged 32 spm.  Donned LUE sling.  Pt amb w/ HW and min/CGA to w/c x 15' w/ cues for posture, BOS, R step length as well as manual cueing for HW placement in confined space and w/ turns.  Pt transfers throughout session w/ CGA/light min A w/ good recall of sequencing.  Pt amb x 51', 71' and 92'  w/ turns to return to seat.  Pt requires frequent cues for HW placement to allow proper BOS and step length R.  Pt returned to room and remained sitting in w/c w/ chair alarm on and all needs n reach, L UE supported on pillow.     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      Therapy/Group: Individual Therapy  Lucio Edward 07/12/2023, 9:18 AM

## 2023-07-12 NOTE — Progress Notes (Signed)
 I was just reviewing his labs.  His platelet count is now 729,000.  Again, I am not surprised by this.  This is exactly where he did when he had his relapse a couple weeks or so ago.  I am sure that the plate count will go below bit further.  He is on anticoagulation.  We can certainly pull back on his lab work now.  I still we will plan on Rituxan as an outpatient.  Again I think he will drop his platelets again.  If there are any questions please let me know.   Christin Bach, MD

## 2023-07-12 NOTE — IPOC Note (Signed)
 Overall Plan of Care [facility]) Patient Details Name: Mark [name] MRN: [id number] DOB: [birth year]  Admitting Diagnosis: Debility  Hospital Problems: Principal Problem:   Debility     Functional Problem List: Nursing Bladder, Bowel, Endurance, Medication Management, Nutrition, Safety, Pain, Perception  PT Balance, Endurance, Motor, Perception, Sensory, Skin Integrity  OT Balance, Edema, Endurance, Motor, Perception, Safety, Sensory  SLP    TR         Basic ADL's: OT Grooming, Bathing, Dressing, Toileting     Advanced  ADL's: OT       Transfers: PT Bed Mobility, Bed to Chair, Car, Occupational psychologist, Research scientist (life sciences): PT Ambulation, Psychologist, prison and probation services, Stairs     Additional Impairments: OT None  SLP        TR      Anticipated Outcomes Item Anticipated Outcome  Self Feeding n/a  Swallowing      Basic self-care  supervision  Toileting  supervision   Bathroom Transfers supervision  Bowel/Bladder  continent of bowels/ manage bladder with medications/time toileting  Transfers  supervision with LRAD  Locomotion  supervision with LRAD  Communication     Cognition     Pain  <4 w/ prns  Safety/Judgment  Manage safety with supervision   Therapy Plan: PT Intensity: Minimum of 1-2 x/day ,45 to 90 minutes PT Frequency: 5 out of 7 days PT Duration Estimated Length of Stay: ~12 days OT Intensity: Minimum of 1-2 x/day, 45 to 90 minutes OT Frequency: 5 out of 7 days OT Duration/Estimated Length of Stay: ~10-12 DAYS     Team Interventions: Nursing Interventions Patient/Family Education, Medication Management, Bladder Management, Disease Management/Prevention, Pain Management, Discharge Planning  PT interventions Ambulation/gait training, Discharge planning, Functional mobility training, Psychosocial support, Therapeutic Activities, Visual/perceptual remediation/compensation, Balance/vestibular training, Disease management/prevention, Neuromuscular  re-education, Skin care/wound management, Therapeutic Exercise, Wheelchair propulsion/positioning, Cognitive remediation/compensation, DME/adaptive equipment instruction, Pain management, Splinting/orthotics, UE/LE Strength taining/ROM, Community reintegration, Development worker, international aid stimulation, Patient/family education, Museum/gallery curator, UE/LE Coordination activities  OT Interventions Warden/ranger, Disease mangement/prevention, Neuromuscular re-education, Self Care/advanced ADL retraining, Therapeutic Exercise, Cognitive remediation/compensation, DME/adaptive equipment instruction, Pain management, Skin care/wound managment, UE/LE Strength taining/ROM, Wheelchair propulsion/positioning, Community reintegration, Development worker, international aid stimulation, Patient/family education, Splinting/orthotics, UE/LE Coordination activities, Discharge planning, Functional mobility training, Psychosocial support, Therapeutic Activities  SLP Interventions    TR Interventions    SW/CM Interventions Patient/Family Education, Psychosocial Support, Discharge Planning   Barriers to Discharge MD  Medical stability, Home enviroment access/loayout, and Lack of/limited family support  Nursing Decreased caregiver support, Home environment access/layout Discharge: House  Discharge Home Layout: One level  Discharge Home Access: Ramped entrance  PT Inaccessible home environment, Home environment access/layout, Other (comments) L hemiparesis with hypertonia, STE, decreased balance/coordination  OT      SLP      SW Decreased caregiver support, Insurance for SNF coverage, Lack of/limited family support     Team Discharge Planning: Destination: PT-Home ,OT- Home , SLP-Home Projected Follow-up: PT-Home health PT, OT-  None, SLP-None Projected Equipment Needs: PT-To be determined, OT- To be determined, SLP-None recommended by SLP Equipment Details: PT-has hemi walker, L PFRW, and WC, OT-  Patient/family involved in  discharge planning: PT- Patient,  OT-Patient, SLP-Patient  MD ELOS: 7-10 days Medical Rehab Prognosis:  Good Assessment: 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 The team will be addressing functional mobility, strength, stamina, balance, safety, adaptive techniques and equipment, self-care, bowel and bladder mgt, patient and caregiver education,.  Goals have been set at supervision to Min A PT, OT, and SLP. Anticipated discharge destination is home.       See Team Conference Notes for weekly updates to the plan of care

## 2023-07-12 NOTE — Progress Notes (Signed)
 PROGRESS NOTE   Subjective/Complaints:  \ ROS: chronic right foot neuropathy, +chronic low back pain   Objective:   No results found. Recent Labs    07/11/23 0657 07/12/23 0541  WBC 16.3* 15.4*  HGB 11.8* 11.3*  HCT 36.4* 34.8*  PLT 602* 729*   Recent Labs    07/11/23 0657 07/12/23 0541  NA 138 139  K 4.2 3.6  CL 104 106  CO2 25 23  GLUCOSE 120* 118*  BUN 37* 31*  CREATININE 1.50* 1.42*  CALCIUM 8.8* 8.7*    Intake/Output Summary (Last 24 hours) at 07/12/2023 0843 Last data filed at 07/12/2023 0452 Gross per 24 hour  Intake 600 ml  Output 3300 ml  Net -2700 ml        Physical Exam: Vital Signs Blood pressure (!) 141/73, pulse 66, temperature 97.9 F (36.6 C), resp. rate 18, height 5\' 11"  (1.803 m), weight 84.6 kg, SpO2 96%. Gen: no distress, normal appearing HEENT: oral mucosa pink and moist, NCAT Cardio: Reg rate Chest: normal effort, normal rate of breathing Abd: soft, non-distended Ext: no edema Psych: pleasant, normal affect Skin: intact Neuro:     Mental Status: AAOx3, long term memory deficits Speech/Languate: Naming and repetition intact, fluent, follows simple commands CRANIAL NERVES: 2-12 grossly intact     MOTOR: RUE: 5/5 Deltoid, 5/5 Biceps, 5/5 Triceps,5/5 Grip LUE: 0/5 Deltoid, 0/5 Biceps, 1-2/5 Triceps, 1/5 Grip RLE: HF 5/5, KE 5/5, ADF 5/5, APF 5/5 LLE: HF 4-/5, KE 4-/5, ADF 2-/5, APF 4-/5, stable 3/28   SENSORY: Altered to LT Lue and LLE   MSK: Increased tone LUE with hand in fist and increased elbow extensor tone Decreased dorsiflexion ROM LLE  Atrophy R hand intrinsic muscles    Assessment/Plan: 1. Functional deficits which require 3+ hours per day of interdisciplinary therapy in a comprehensive inpatient rehab setting. Physiatrist is providing close team supervision and 24 hour management of active medical problems listed below. Physiatrist and rehab team  continue to assess barriers to discharge/monitor patient progress toward functional and medical goals  Care Tool:  Bathing    Body parts bathed by patient: Chest, Abdomen, Front perineal area, Right upper leg, Left upper leg, Right lower leg, Left lower leg, Face   Body parts bathed by helper: Right arm, Left arm, Buttocks     Bathing assist Assist Level: Moderate Assistance - Patient 50 - 74%     Upper Body Dressing/Undressing Upper body dressing   What is the patient wearing?: Pull over shirt    Upper body assist Assist Level: Moderate Assistance - Patient 50 - 74%    Lower Body Dressing/Undressing Lower body dressing      What is the patient wearing?: Underwear/pull up, Pants     Lower body assist Assist for lower body dressing: Minimal Assistance - Patient > 75%     Toileting Toileting    Toileting assist Assist for toileting: Maximal Assistance - Patient 25 - 49%     Transfers Chair/bed transfer  Transfers assist     Chair/bed transfer assist level: Minimal Assistance - Patient > 75%     Locomotion Ambulation   Ambulation assist  Assist level: Minimal Assistance - Patient > 75% Assistive device: Walker-hemi Max distance: 92ft   Walk 10 feet activity   Assist     Assist level: Minimal Assistance - Patient > 75% Assistive device: Walker-hemi   Walk 50 feet activity   Assist    Assist level: Minimal Assistance - Patient > 75% Assistive device: Walker-hemi    Walk 150 feet activity   Assist Walk 150 feet activity did not occur: Safety/medical concerns (fatigue)         Walk 10 feet on uneven surface  activity   Assist Walk 10 feet on uneven surfaces activity did not occur: Safety/medical concerns (fatigue)         Wheelchair     Assist Is the patient using a wheelchair?: Yes Type of Wheelchair: Manual    Wheelchair assist level: Dependent - Patient 0% Max wheelchair distance: 117ft    Wheelchair 50 feet  with 2 turns activity    Assist        Assist Level: Dependent - Patient 0%   Wheelchair 150 feet activity     Assist      Assist Level: Dependent - Patient 0%   Blood pressure (!) 141/73, pulse 66, temperature 97.9 F (36.6 C), resp. rate 18, height 5\' 11"  (1.803 m), weight 84.6 kg, SpO2 96%.  Medical Problem List and Plan: 1. Functional deficits secondary to debility due to ITP             -patient may  shower             -ELOS/Goals: 7-10 days, Sup to Min a PT/OT/SLP             -Admit to CIR 2.  Antithrombotics: -DVT/anticoagulation:  Pharmaceutical: Xarelto             -antiplatelet therapy: ASA 3.Right foot neuropathic pain: scheduled for outpatient Qutenza. Tylenol prn. Continue gabapentin. Continue Norco PRN.  4. Mood/Behavior/Sleep: LCSW to follow for evaluation and support.              -antipsychotic agents: N/A 5. Neuropsych/cognition: This patient is capable of making decisions on his own behalf. 6. Skin/Wound Care: Routine pressure relief measures.  7. Fluids/Electrolytes/Nutrition: Strict I/O. Daily CMET per hem/Onc 8.  Chronic ITP w/relapse: Has been received Decadron, IVIG and Nplate             --plans for rituxan on outpatient basis per Dr. Myna Hidalgo             --continue daily CMET/CBC X 4 days per input from Dr. Myna Hidalgo.    - Per Dr. Myna Hidalgo, expected platelet increase, will drop again as OP with Rituxin initiation  9. Epistaxis: Nasal packing--augmentin X 3 d/c on 03/25. 10 .Prediabetes: Hgb A1C- 6.2. Elevated due to steriods but trending down. --Continue to monitor BS ac/hs and use SSI for elevated BS 11. H/o CVA X 2 w/neurogenic bladder: On Xarelto and ASA.             --At baseline completes intermittent self cath several times a day.  12.Seizure d/o: Seizure free on Keppra 1500 mg bid 13. Anxiety/depression: On Buropion and Sertraline 14. CKD 3A: BUN/SCr 24/1.3--> Baseline SCr 1.3-->40/1.17. Avoid nephrotoxic medications. Repeat tomorrow  since trending upward .  16. Leucocytosis: May be steroid related. WBC reviewed and is stable, repeat tomorrow   - stable 14-16  17. Essential HTN. Monitor BP. Continue norvasc, add on magnesium level    07/12/2023    4:53 AM  07/11/2023    8:10 PM 07/11/2023   12:47 PM  Vitals with BMI  Systolic 141 125 161  Diastolic 73 70 71  Pulse 66 73 79    18. Spasticity. Continue baclofen  19. Suboptimal vitamin D: D3 ordered, increase to 2,000U daily  20. Transaminitis: improved, prn tylenol added back    - AST normalized, ALT improving     LOS: 3 days A FACE TO FACE EVALUATION WAS PERFORMED  Angelina Sheriff 07/12/2023, 8:43 AM

## 2023-07-13 LAB — COMPREHENSIVE METABOLIC PANEL WITH GFR
ALT: 44 U/L (ref 0–44)
AST: 31 U/L (ref 15–41)
Albumin: 3 g/dL — ABNORMAL LOW (ref 3.5–5.0)
Alkaline Phosphatase: 52 U/L (ref 38–126)
Anion gap: 9 (ref 5–15)
BUN: 25 mg/dL — ABNORMAL HIGH (ref 8–23)
CO2: 25 mmol/L (ref 22–32)
Calcium: 9 mg/dL (ref 8.9–10.3)
Chloride: 105 mmol/L (ref 98–111)
Creatinine, Ser: 1.38 mg/dL — ABNORMAL HIGH (ref 0.61–1.24)
GFR, Estimated: 52 mL/min — ABNORMAL LOW (ref >60)
Glucose, Bld: 143 mg/dL — ABNORMAL HIGH (ref 70–99)
Potassium: 3.6 mmol/L (ref 3.5–5.1)
Sodium: 139 mmol/L (ref 135–145)
Total Bilirubin: 0.5 mg/dL (ref 0.0–1.2)
Total Protein: 7.4 g/dL (ref 6.5–8.1)

## 2023-07-13 MED ORDER — MAGNESIUM GLUCONATE 500 (27 MG) MG PO TABS
250.0000 mg | ORAL_TABLET | Freq: Every day | ORAL | Status: DC
  Administered 2023-07-14 – 2023-07-19 (×7): 250 mg via ORAL
  Filled 2023-07-13 (×7): qty 1

## 2023-07-13 NOTE — Progress Notes (Signed)
 PROGRESS NOTE   Subjective/Complaints:  Nursing reports patient complaining of his right arm falling asleep at nighttime.  Patient stating he is getting cramping in his right hand, especially over the thenar eminence, and strange sensations that are "similar to my leg, which I thought was shingles".  Labs this a.m. significant for downtrending BUN/creatinine, otherwise stable.  ROS: Positives per HPI above. Denies fevers, chills, N/V, abdominal pain, SOB, chest pain, new weakness or paraesthesias.   + R Hand and R foot cramping --intermittent, nightly  Objective:   No results found. Recent Labs    07/11/23 0657 07/12/23 0541  WBC 16.3* 15.4*  HGB 11.8* 11.3*  HCT 36.4* 34.8*  PLT 602* 729*   Recent Labs    07/12/23 0541 07/13/23 0730  NA 139 139  K 3.6 3.6  CL 106 105  CO2 23 25  GLUCOSE 118* 143*  BUN 31* 25*  CREATININE 1.42* 1.38*  CALCIUM 8.7* 9.0    Intake/Output Summary (Last 24 hours) at 07/13/2023 1000 Last data filed at 07/13/2023 0718 Gross per 24 hour  Intake 480 ml  Output 3300 ml  Net -2820 ml        Physical Exam: Vital Signs Blood pressure 121/72, pulse 72, temperature 97.9 F (36.6 C), resp. rate 18, height 5\' 11"  (1.803 m), weight 84.6 kg, SpO2 97%. Gen: no distress, normal appearing.  Sitting up in wheelchair. HEENT: oral mucosa pink and moist, NCAT Cardio: Reg rate and rhythm. Chest: normal effort, normal rate of breathing.  Clear to auscultation bilaterally. Abd: soft, non-distended.  Positive bowel sounds, normoactive. Ext: Trace bilateral lower extremity edema, nontender on palpation. Psych: pleasant, normal affect Skin: intact Neuro:     Mental Status: AAOx3, long term memory deficits Speech/Languate: Naming and repetition intact, fluent, follows simple commands CRANIAL NERVES: 2-12 grossly intact     MOTOR: RUE: 5/5 Deltoid, 5/5 Biceps, 5/5 Triceps,5/5 Grip LUE: 0/5  Deltoid, 0/5 Biceps, 1-2/5 Triceps, 1/5 Grip--isolated in sling RLE: HF 5/5, KE 5/5, ADF 5/5, APF 5/5 LLE: HF 4-/5, KE 4-/5, ADF 2-/5, APF 4-/5, stable 3/ 30   SENSORY: Altered to LT Lue and LLE   MSK: Increased tone LUE with hand in fist and increased elbow extensor tone Decreased dorsiflexion ROM LLE   Atrophy R hand intrinsic muscles --scar from prior surgery along palm--no sensory or motor deficits notable.  Negative Tinel's   Assessment/Plan: 1. Functional deficits which require 3+ hours per day of interdisciplinary therapy in a comprehensive inpatient rehab setting. Physiatrist is providing close team supervision and 24 hour management of active medical problems listed below. Physiatrist and rehab team continue to assess barriers to discharge/monitor patient progress toward functional and medical goals  Care Tool:  Bathing    Body parts bathed by patient: Chest, Abdomen, Front perineal area, Right upper leg, Left upper leg, Right lower leg, Left lower leg, Face   Body parts bathed by helper: Right arm, Left arm, Buttocks     Bathing assist Assist Level: Moderate Assistance - Patient 50 - 74%     Upper Body Dressing/Undressing Upper body dressing   What is the patient wearing?: Pull over shirt    Upper body  assist Assist Level: Moderate Assistance - Patient 50 - 74%    Lower Body Dressing/Undressing Lower body dressing      What is the patient wearing?: Underwear/pull up, Pants     Lower body assist Assist for lower body dressing: Minimal Assistance - Patient > 75%     Toileting Toileting    Toileting assist Assist for toileting: Maximal Assistance - Patient 25 - 49%     Transfers Chair/bed transfer  Transfers assist     Chair/bed transfer assist level: Minimal Assistance - Patient > 75%     Locomotion Ambulation   Ambulation assist      Assist level: Minimal Assistance - Patient > 75% Assistive device: Walker-hemi Max distance: 92   Walk  10 feet activity   Assist     Assist level: Minimal Assistance - Patient > 75% Assistive device: Walker-hemi   Walk 50 feet activity   Assist    Assist level: Minimal Assistance - Patient > 75% Assistive device: Walker-hemi    Walk 150 feet activity   Assist Walk 150 feet activity did not occur: Safety/medical concerns (fatigue)         Walk 10 feet on uneven surface  activity   Assist Walk 10 feet on uneven surfaces activity did not occur: Safety/medical concerns (fatigue)         Wheelchair     Assist Is the patient using a wheelchair?: Yes Type of Wheelchair: Manual    Wheelchair assist level: Supervision/Verbal cueing Max wheelchair distance: 90    Wheelchair 50 feet with 2 turns activity    Assist        Assist Level: Supervision/Verbal cueing   Wheelchair 150 feet activity     Assist      Assist Level: Maximal Assistance - Patient 25 - 49%   Blood pressure 121/72, pulse 72, temperature 97.9 F (36.6 C), resp. rate 18, height 5\' 11"  (1.803 m), weight 84.6 kg, SpO2 97%.  Medical Problem List and Plan: 1. Functional deficits secondary to debility due to ITP             -patient may  shower             -ELOS/Goals: 7-10 days, Sup to Min a PT/OT/SLP             -Stable to continue CIR 2.  Antithrombotics: -DVT/anticoagulation:  Pharmaceutical: Xarelto             -antiplatelet therapy: ASA 3.Right foot neuropathic pain: scheduled for outpatient Qutenza. Tylenol prn. Continue gabapentin. Continue Norco PRN.   3-29: Pain well-controlled on current regimen  4. Mood/Behavior/Sleep: LCSW to follow for evaluation and support.              -antipsychotic agents: N/A 5. Neuropsych/cognition: This patient is capable of making decisions on his own behalf. 6. Skin/Wound Care: Routine pressure relief measures.  7. Fluids/Electrolytes/Nutrition: Strict I/O. Daily CMET per hem/Onc 8.  Chronic ITP w/relapse: Has been received Decadron, IVIG  and Nplate             --plans for rituxan on outpatient basis per Dr. Myna Hidalgo             --continue daily CMET/CBC X 4 days per input from Dr. Myna Hidalgo.    - Per Dr. Myna Hidalgo, expected platelet increase, will drop again as OP with Rituxin initiation  -Repeat labs Monday a.m.  9. Epistaxis: Nasal packing--augmentin X 3 d/c on 03/25. 10 .Prediabetes: Hgb A1C- 6.2. Elevated due  to steriods but trending down. --Continue to monitor BS ac/hs and use SSI for elevated BS 11. H/o CVA X 2 w/neurogenic bladder: On Xarelto and ASA.             --At baseline completes intermittent self cath several times a day.  12.Seizure d/o: Seizure free on Keppra 1500 mg bid 13. Anxiety/depression: On Buropion and Sertraline 14. CKD 3A: BUN/SCr 24/1.3--> Baseline SCr 1.3-->40/1.17. Avoid nephrotoxic medications. Repeat tomorrow since trending upward  -3-30: Continues to downtrend, BUN 25,, creatinine 1.38.  Essentially baseline. .  16. Leucocytosis: May be steroid related. WBC reviewed and is stable, repeat tomorrow   - stable 14-16  17. Essential HTN. Monitor BP. Continue norvasc, add on magnesium level  -BP well-controlled.    07/13/2023    4:28 AM 07/12/2023    8:14 PM 07/12/2023    2:16 PM  Vitals with BMI  Systolic 121 132 454  Diastolic 72 68 78  Pulse 72 78 73    18. Spasticity. Continue baclofen  19. Suboptimal vitamin D: D3 ordered, increase to 2,000U daily  20. Transaminitis: improved, prn tylenol added back    - AST normalized, ALT improving  21.  Cramping in right hand and foot--worse at nighttime. ?  RLS versus CTS and hand.  -Add nightly magnesium gluconate 250 mg; get mag levels in a.m.  -Discussed trial of neutral wrist brace as outpatient for at least 6 weeks  LOS: 4 days A FACE TO FACE EVALUATION WAS PERFORMED  Angelina Sheriff 07/13/2023, 10:00 AM

## 2023-07-14 LAB — LUPUS ANTICOAGULANT PANEL
DRVVT: 85.3 s — ABNORMAL HIGH (ref 0.0–47.0)
PTT Lupus Anticoagulant: 41.4 s (ref 0.0–43.5)

## 2023-07-14 LAB — PROTEIN C, TOTAL: Protein C, Total: 158 % — ABNORMAL HIGH (ref 60–150)

## 2023-07-14 LAB — MAGNESIUM: Magnesium: 2 mg/dL (ref 1.7–2.4)

## 2023-07-14 LAB — PROTEIN C ACTIVITY: Protein C Activity: 167 % (ref 73–180)

## 2023-07-14 LAB — DRVVT CONFIRM: dRVVT Confirm: 1.6 ratio — ABNORMAL HIGH (ref 0.8–1.2)

## 2023-07-14 LAB — PROTEIN S, TOTAL: Protein S Ag, Total: 96 % (ref 60–150)

## 2023-07-14 LAB — PROTEIN S ACTIVITY: Protein S Activity: 66 % (ref 63–140)

## 2023-07-14 LAB — DRVVT MIX: dRVVT Mix: 66.2 s — ABNORMAL HIGH (ref 0.0–40.4)

## 2023-07-14 LAB — BETA-2-GLYCOPROTEIN I ABS, IGG/M/A
Beta-2 Glyco I IgG: <9 GPI IgG units (ref 0–20)
Beta-2-Glycoprotein I IgA: <9 GPI IgA units (ref 0–25)
Beta-2-Glycoprotein I IgM: <9 GPI IgM units (ref 0–32)

## 2023-07-14 LAB — HOMOCYSTEINE: Homocysteine: 16.4 umol/L (ref 0.0–19.2)

## 2023-07-14 NOTE — Progress Notes (Signed)
 Occupational Therapy Session Note  Patient Details  Name: Mark [name] MRN: [id number] Date of Birth: 19-May-1945  Today's Date: 07/14/2023 OT Individual Time: 1407-1500 OT Individual Time Calculation (min): 53 min    Short Term Goals: Week 1:  OT Short Term Goal 1 (Week 1): Pt will perform tub bench transfer into bathtub with contact guard with cues OT Short Term Goal 2 (Week 1): Pt will don shirt with supervision OT Short Term Goal 3 (Week 1): Pt will be able to stand with contact guard for 3 minutes in prep for cathing in standing position  Skilled Therapeutic Interventions/Progress Updates:  Skilled OT intervention completed with focus on ADL retraining, functional transfers, dynamic standing balance. Pt received semi supine in bed, agreeable to session. No pain reported.  Transitioned supine > sit with supervision. Donned R shoe with supervision, however required min A for LLE with AFO but no assist for straps with increased time.   CGA sit > stand with hemi walker then CGA stand pivot > w/c with pt demo of trunk flexion and downward gaze. Pt endorsed mild dizziness after transfer however improved with seated rest and hydration.  Transported dependently in w/c <> gym. Pt participated in the following dynamic standing balance and endurance tasks to promote independence and safety during BADLs and functional mobility: -placing and retrieving horse shoes from top of long mirror. CGA sit > stand with pt pushing up with RUE on arm rest, then using hemi walker for steadying assist only otherwise did not use. CGA needed for dynamic standing balance during task. Cues needed for erect posture and upright gaze -placing and retrieving squigz from long mirror. CGA sit > stand with prior method without AD, and up to min A needed for balance  Back in room pt remained seated in w/c, with belt alarm on/activated, and with all needs in reach at end of session.    Therapy Documentation Precautions:   Precautions Precautions: Fall Precaution/Restrictions Comments: Left hemiplegia CVA prior to admit Restrictions Weight Bearing Restrictions Per Provider Order: No    Therapy/Group: Individual Therapy  Melvyn Novas, MS, OTR/L  07/14/2023, 3:41 PM

## 2023-07-14 NOTE — Progress Notes (Signed)
 Physical Therapy Session Note  Patient Details  Name: Mark [name] MRN: [id number] Date of Birth: [birth year]  Today's Date: 07/14/2023 PT Individual Time: 0731-0841 PT Individual Time Calculation (min): 70 min   Short Term Goals: Week 1:  PT Short Term Goal 1 (Week 1): pt will transfer sit<>stand with LRAD and CGA PT Short Term Goal 2 (Week 1): pt will transfer bed<>chair with LRAD and CGA PT Short Term Goal 3 (Week 1): pt will ambulate 72ft with LRAD and CGA  Skilled Therapeutic Interventions/Progress Updates:   Received pt semi-reclined in bed, pt agreeable to PT treatment, and reported minor headache and pain in R fingers (RN notified). Pt reporting pain in fingertips reminds him of shingles pain he has experienced in the past. Pt also reporting minor increase in confusion this morning - treatment team notified. Session with emphasis on functional mobility/transfers, dressing, generalized strengthening and endurance, dynamic standing balance/coordination, and gait training. Pt transferred semi-reclined<>sitting R EOB with HOB elevated and use of bedrails with supervision. Donned shoes and L AFO with max A and doffed dirty shirt and donned clean one with supervision and increased time. Stood from EOB with hemi walker and CGA and required assist to pull pants over hips.    Pt performed all transfers with hemi walker and CGA throughout session. Sat in WC at sink and washed hands and donned L sling with mod A. Pt transported to/from room in [facility] dependently for time management purposes. Pt ambulated 45ft x 1 and 73ft x 1 with hemi walker and CGA/light min A when turning. Pt demonstrated improvements in R stride length and positioning of hemi walker with gait, but continues to require cues for foot placement when standing from WC (pt tends to tuck R foot under WC). RN arrived to administer medications and MD present for morning rounds - requested order for L AFO as pt reports he is not sure what  happened to his old one. Returned to room and concluded session with pt sitting in WC, needs within reach, and seatbelt alarm on. Provided pt with coffee and placed pillows under LUE for improved hemibody positioning.   Therapy Documentation Precautions:  Precautions Precautions: Fall Precaution/Restrictions Comments: Left hemiplegia CVA prior to admit Restrictions Weight Bearing Restrictions Per Provider Order: No  Therapy/Group: Individual Therapy Marlana Salvage Zaunegger Blima Rich PT, DPT 07/14/2023, 6:49 AM

## 2023-07-14 NOTE — Progress Notes (Signed)
 Physical Therapy Session Note  Patient Details  Name: Mark [name] MRN: [id number] Date of Birth: 17-Mar-1946  Today's Date: 07/14/2023 PT Individual Time: 9147-8295 PT Individual Time Calculation (min): 69 min   Short Term Goals: Week 1:  PT Short Term Goal 1 (Week 1): pt will transfer sit<>stand with LRAD and CGA PT Short Term Goal 2 (Week 1): pt will transfer bed<>chair with LRAD and CGA PT Short Term Goal 3 (Week 1): pt will ambulate 92ft with LRAD and CGA  Skilled Therapeutic Interventions/Progress Updates:      Pt sitting upright in wheelchair - agreeable to therapy treatment. He has no complaints of pain.   Transported at wheelchair level to main rehab gym to initiate stair training. Pt confirms around 5 steps to enter with bilateral hand rails. He also reports a plan to have a ramp built before DC but patient unsure if that will happen.   Completed stair training up/down x4 stairs (x2 times with a seated rest break) using R hand rail and minA. Completes with a step-to pattern while forward facing - min cues for setup and sequencing. Patient having increased difficulty with descent > ascent with a R trunk lean and difficulty with placing L foot in a wide enough position to prevent overcrowding.  Next, completed ramp training for unlevel gait and to simulate home entrance if patient has a ramp installed prior to DC. Practiced going up/down x26ft ramp (x2 times without a rest break) with CGA/minA. Patient does best when using the hand rail rather than the hemi walker. MinA needed for turning.   Pt assisted onto the Nust[place] with a minA stand pivot transfer. Completed 11 minutes while using BLE and RUE - resistance set to L5. Patient maintaining cadence > 40 spm, achieving a total of 555 steps.   MinA for stand pivot transfer back to his wheelchair - increasing difficulty with transferring towards his weaker L side.   Returned to his room and patient left sitting upright with his needs  met. Pt aware of therapy schedule this PM.    Therapy Documentation Precautions:  Precautions Precautions: Fall Precaution/Restrictions Comments: Left hemiplegia CVA prior to admit Restrictions Weight Bearing Restrictions Per Provider Order: No General:    Therapy/Group: Individual Therapy  Orrin Brigham 07/14/2023, 7:47 AM

## 2023-07-14 NOTE — Progress Notes (Signed)
 PROGRESS NOTE   Subjective/Complaints:  Patient reports some itching rash on his neck where he has been wearing his shoulder sling.  No additional concerns this morning.  Later this afternoon therapy reports a little more shortness of breath on exertion.  No chest pain or baseline shortness of breath.  No chest heaviness.  ROS: Positives per HPI above. Denies fevers, chills, N/V, abdominal pain, SOB, chest pain, ,new weakness or paraesthesias.   + R Hand and R foot cramping --intermittent, nightly + Rash  Objective:   No results found. Recent Labs    07/12/23 0541  WBC 15.4*  HGB 11.3*  HCT 34.8*  PLT 729*   Recent Labs    07/12/23 0541 07/13/23 0730  NA 139 139  K 3.6 3.6  CL 106 105  CO2 23 25  GLUCOSE 118* 143*  BUN 31* 25*  CREATININE 1.42* 1.38*  CALCIUM 8.7* 9.0    Intake/Output Summary (Last 24 hours) at 07/14/2023 1641 Last data filed at 07/14/2023 1255 Gross per 24 hour  Intake 480 ml  Output 2200 ml  Net -1720 ml        Physical Exam: Vital Signs Blood pressure 113/80, pulse 81, temperature 98.4 F (36.9 C), resp. rate 18, height 5\' 11"  (1.803 m), weight 85.6 kg, SpO2 96%. Gen: no distress, normal appearing.  Sitting up in wheelchair.  Working with physical therapy HEENT: oral mucosa pink and moist, NCAT Cardio: Reg rate and rhythm. Chest: normal effort, normal rate of breathing.  Clear to auscultation bilaterally.  On room air Abd: soft, non-distended.  Positive bowel sounds, normoactive. Ext: Trace bilateral lower extremity edema, nontender on palpation. Psych: pleasant, normal affect Skin: intact Neuro:     Mental Status: AAOx3, long term memory deficits Speech/Languate: Naming and repetition intact, fluent, follows simple commands CRANIAL NERVES: 2-12 grossly intact Atrophy right hand intrinsic muscles Wearing AFO on left lower extremity   Prior exam MOTOR: RUE: 5/5 Deltoid, 5/5  Biceps, 5/5 Triceps,5/5 Grip LUE: 0/5 Deltoid, 0/5 Biceps, 1-2/5 Triceps, 1/5 Grip--isolated in sling RLE: HF 5/5, KE 5/5, ADF 5/5, APF 5/5 LLE: HF 4-/5, KE 4-/5, ADF 2-/5, APF 4-/5, stable 3/ 30   SENSORY: Altered to LT Lue and LLE   MSK: Increased tone LUE with hand in fist and increased elbow extensor tone Decreased dorsiflexion ROM LLE   Atrophy R hand intrinsic muscles --scar from prior surgery along palm--no sensory or motor deficits notable.  Negative Tinel's   Assessment/Plan: 1. Functional deficits which require 3+ hours per day of interdisciplinary therapy in a comprehensive inpatient rehab setting. Physiatrist is providing close team supervision and 24 hour management of active medical problems listed below. Physiatrist and rehab team continue to assess barriers to discharge/monitor patient progress toward functional and medical goals  Care Tool:  Bathing    Body parts bathed by patient: Chest, Abdomen, Front perineal area, Right upper leg, Left upper leg, Right lower leg, Left lower leg, Face   Body parts bathed by helper: Right arm, Left arm, Buttocks     Bathing assist Assist Level: Moderate Assistance - Patient 50 - 74%     Upper Body Dressing/Undressing Upper body dressing  What is the patient wearing?: Pull over shirt    Upper body assist Assist Level: Moderate Assistance - Patient 50 - 74%    Lower Body Dressing/Undressing Lower body dressing      What is the patient wearing?: Underwear/pull up, Pants     Lower body assist Assist for lower body dressing: Minimal Assistance - Patient > 75%     Toileting Toileting    Toileting assist Assist for toileting: Maximal Assistance - Patient 25 - 49%     Transfers Chair/bed transfer  Transfers assist     Chair/bed transfer assist level: Contact Guard/Touching assist     Locomotion Ambulation   Ambulation assist      Assist level: Contact Guard/Touching assist Assistive device:  Walker-hemi Max distance: 65ft   Walk 10 feet activity   Assist     Assist level: Contact Guard/Touching assist Assistive device: Walker-hemi   Walk 50 feet activity   Assist    Assist level: Contact Guard/Touching assist Assistive device: Walker-hemi    Walk 150 feet activity   Assist Walk 150 feet activity did not occur: Safety/medical concerns (fatigue)         Walk 10 feet on uneven surface  activity   Assist Walk 10 feet on uneven surfaces activity did not occur: Safety/medical concerns (fatigue)         Wheelchair     Assist Is the patient using a wheelchair?: Yes Type of Wheelchair: Manual    Wheelchair assist level: Supervision/Verbal cueing Max wheelchair distance: 90    Wheelchair 50 feet with 2 turns activity    Assist        Assist Level: Supervision/Verbal cueing   Wheelchair 150 feet activity     Assist      Assist Level: Maximal Assistance - Patient 25 - 49%   Blood pressure 113/80, pulse 81, temperature 98.4 F (36.9 C), resp. rate 18, height 5\' 11"  (1.803 m), weight 85.6 kg, SpO2 96%.  Medical Problem List and Plan: 1. Functional deficits secondary to debility due to ITP             -patient may  shower             -ELOS/Goals: 7-10 days, Sup to Min a PT/OT/SLP             -Stable to continue CIR  -Left AFO was ordered  -Team conference tomorrow  -will order chest x-ray due to increased dyspnea with exertion reported this afternoon 2.  Antithrombotics: -DVT/anticoagulation:  Pharmaceutical: Xarelto             -antiplatelet therapy: ASA 3.Right foot neuropathic pain: scheduled for outpatient Qutenza. Tylenol prn. Continue gabapentin. Continue Norco PRN.   3-29: Pain well-controlled on current regimen  4. Mood/Behavior/Sleep: LCSW to follow for evaluation and support.              -antipsychotic agents: N/A 5. Neuropsych/cognition: This patient is capable of making decisions on his own behalf. 6. Skin/Wound  Care: Routine pressure relief measures.  7. Fluids/Electrolytes/Nutrition: Strict I/O. Daily CMET per hem/Onc 8.  Chronic ITP w/relapse: Has been received Decadron, IVIG and Nplate             --plans for rituxan on outpatient basis per Dr. Myna Hidalgo             --continue daily CMET/CBC X 4 days per input from Dr. Myna Hidalgo.    - Per Dr. Myna Hidalgo, expected platelet increase, will drop again as OP with  Rituxin initiation  -4/1 platelets elevated 963, discussed with Dr. Myna Hidalgo.  No changes necessary, he will see tomorrow.  Appreciate his assistance  9. Epistaxis: Nasal packing--augmentin X 3 d/c on 03/25. 10 .Prediabetes: Hgb A1C- 6.2. Elevated due to steriods but trending down. --Continue to monitor BS ac/hs and use SSI for elevated BS -4/1 glucose stable on CBG  11. H/o CVA X 2 w/neurogenic bladder: On Xarelto and ASA.             --At baseline completes intermittent self cath several times a day.  12.Seizure d/o: Seizure free on Keppra 1500 mg bid 13. Anxiety/depression: On Buropion and Sertraline 14. CKD 3A: BUN/SCr 24/1.3--> Baseline SCr 1.3-->40/1.17. Avoid nephrotoxic medications. Repeat tomorrow since trending upward  -3-30: Continues to downtrend, BUN 25,, creatinine 1.38.  Essentially baseline.  -4/1 BUN and creatinine improved at 18/1.34 .  16. Leucocytosis: May be steroid related. WBC reviewed and is stable, repeat tomorrow   - stable 14-16  17. Essential HTN. Monitor BP. Continue norvasc, add on magnesium level  -4/1 controlled continue to monitor    07/14/2023    1:47 PM 07/14/2023    4:44 AM 07/13/2023    8:13 PM  Vitals with BMI  Weight  188 lbs 11 oz   BMI  26.33   Systolic 113 129 161  Diastolic 80 70 107  Pulse 81 72 71    18. Spasticity. Continue baclofen  19. Suboptimal vitamin D: D3 ordered, increase to 2,000U daily  20. Transaminitis: improved, prn tylenol added back    - AST normalized, ALT improving  21.  Cramping in right hand and foot--worse at  nighttime. ?  RLS versus CTS and hand.  -Add nightly magnesium gluconate 250 mg; get mag levels in a.m.  -Discussed trial of neutral wrist brace as outpatient for at least 6 weeks  LOS: 5 days A FACE TO FACE EVALUATION WAS PERFORMED  Fanny Dance 07/14/2023, 4:41 PM

## 2023-07-15 ENCOUNTER — Inpatient Hospital Stay (HOSPITAL_COMMUNITY)

## 2023-07-15 LAB — ANTINUCLEAR ANTIBODIES, IFA: ANA Ab, IFA: NEGATIVE

## 2023-07-15 LAB — CBC WITH DIFFERENTIAL/PLATELET
Abs Immature Granulocytes: 0.24 10*3/uL — ABNORMAL HIGH (ref 0.00–0.07)
Basophils Absolute: 0 10*3/uL (ref 0.0–0.1)
Basophils Relative: 0 %
Eosinophils Absolute: 0.3 10*3/uL (ref 0.0–0.5)
Eosinophils Relative: 2 %
HCT: 34 % — ABNORMAL LOW (ref 39.0–52.0)
Hemoglobin: 11.1 g/dL — ABNORMAL LOW (ref 13.0–17.0)
Immature Granulocytes: 2 %
Lymphocytes Relative: 21 %
Lymphs Abs: 3.2 10*3/uL (ref 0.7–4.0)
MCH: 30.6 pg (ref 26.0–34.0)
MCHC: 32.6 g/dL (ref 30.0–36.0)
MCV: 93.7 fL (ref 80.0–100.0)
Monocytes Absolute: 2.8 10*3/uL — ABNORMAL HIGH (ref 0.1–1.0)
Monocytes Relative: 18 %
Neutro Abs: 8.7 10*3/uL — ABNORMAL HIGH (ref 1.7–7.7)
Neutrophils Relative %: 57 %
Platelets: 963 10*3/uL (ref 150–400)
RBC: 3.63 MIL/uL — ABNORMAL LOW (ref 4.22–5.81)
RDW: 18.7 % — ABNORMAL HIGH (ref 11.5–15.5)
WBC: 15.2 10*3/uL — ABNORMAL HIGH (ref 4.0–10.5)
nRBC: 0.8 % — ABNORMAL HIGH (ref 0.0–0.2)

## 2023-07-15 LAB — BASIC METABOLIC PANEL WITH GFR
Anion gap: 6 (ref 5–15)
BUN: 18 mg/dL (ref 8–23)
CO2: 27 mmol/L (ref 22–32)
Calcium: 9 mg/dL (ref 8.9–10.3)
Chloride: 104 mmol/L (ref 98–111)
Creatinine, Ser: 1.34 mg/dL — ABNORMAL HIGH (ref 0.61–1.24)
GFR, Estimated: 54 mL/min — ABNORMAL LOW (ref >60)
Glucose, Bld: 114 mg/dL — ABNORMAL HIGH (ref 70–99)
Potassium: 4.6 mmol/L (ref 3.5–5.1)
Sodium: 137 mmol/L (ref 135–145)

## 2023-07-15 LAB — CARDIOLIPIN ANTIBODIES, IGG, IGM, IGA
Anticardiolipin IgA: <9 U/mL (ref 0–11)
Anticardiolipin IgG: <9 GPL U/mL (ref 0–14)
Anticardiolipin IgM: 11 [MPL'U]/mL (ref 0–12)

## 2023-07-15 MED ORDER — HYDROCORTISONE 0.5 % EX CREA
TOPICAL_CREAM | Freq: Two times a day (BID) | CUTANEOUS | Status: DC
  Filled 2023-07-15: qty 28.35

## 2023-07-15 NOTE — Progress Notes (Signed)
 Date and time results received: 07/15/23 0727 (use smartphrase ".now" to insert current time)  Test: Platelet Critical Value: 963  Name of Provider Notified: Deatra Ina, PA  Orders Received? Or Actions Taken?:  NONE

## 2023-07-15 NOTE — Progress Notes (Signed)
 Occupational Therapy Session Note  Patient Details  Name: Mark [name] MRN: [id number] Date of Birth: April 08, 1946  Today's Date: 07/15/2023 OT Individual Time: 4098-1191 OT Individual Time Calculation (min): 70 min    Short Term Goals: Week 1:  OT Short Term Goal 1 (Week 1): Pt will perform tub bench transfer into bathtub with contact guard with cues OT Short Term Goal 2 (Week 1): Pt will don shirt with supervision OT Short Term Goal 3 (Week 1): Pt will be able to stand with contact guard for 3 minutes in prep for cathing in standing position  Skilled Therapeutic Interventions/Progress Updates:  Skilled OT intervention completed with focus on ADL retraining, functional endurance, and mobility within a shower context. Pt supine in bed with nursing completing cath procedure. Pt agreeable to session and to shower. No pain reported. OT retrieved shower items and set up shower while nursing finished procedure.  Pt completed min/mod A stand pivot without AD or LLE AFO during session with cues needed for body positioning, hand placement and safety. Does much better with hemi walker and LLE AFO.  Transitioned to EOB with supervision, stand/squat pivot > w/c, transported dependently in w/c > shower. Completed stand pivot to bench using grab bar for balance. Max A needed to doff brief and socks in prep for shower, no assist for shirt.  Good recall of doffing hearing aids without prompting. Pt was able to bathe all parts with supervision while seated only. Did need assist to hold shower head handle during hair rinsing. Stand pivot > w/c using grab bar. Able to donn deo with assist only to lift LUE, but set up A for UB dressing. Threaded LB clothing with supervision, however required min A for sit > stand and min A for dynamic standing balance using sink for UE support. Min A to donn over L side of body. Donned socks with supervision using figure 4 position. Donned R shoe set up A, L shoe with AFO with max  A for time. Pt noted to be SOB with exertion but also with simple ADL tasks with ability to recover but noticeably different from yesterday and pt also noticing. O2 sats 100% and pt denied chest heaviness or pain. MD and nursing made aware.  OT completed PROM to LUE wrist/digits as well as forearm supination with pt lacking digit/wrist extension and forearm supination. Education provided on self PROM when tone is lower so its easier to manage with hemi technique and reminded pt of wear of resting hand splint at night.   Pt remained seated, with chair alarm on/activated, and with all needs in reach at end of session.   Therapy Documentation Precautions:  Precautions Precautions: Fall Precaution/Restrictions Comments: Left hemiplegia CVA prior to admit Restrictions Weight Bearing Restrictions Per Provider Order: No    Therapy/Group: Individual Therapy  Melvyn Novas, MS, OTR/L  07/15/2023, 2:44 PM

## 2023-07-15 NOTE — Progress Notes (Signed)
 Physical Therapy Session Note  Patient Details  Name: Mark [name] MRN: [id number] Date of Birth: [birth year]  Today's Date: 07/15/2023 PT Individual Time: 0817-0900 and 1031-1110 and 1417-1455 PT Individual Time Calculation (min): 43 min and 39 min and 38 min  Short Term Goals: Week 1:  PT Short Term Goal 1 (Week 1): pt will transfer sit<>stand with LRAD and CGA PT Short Term Goal 2 (Week 1): pt will transfer bed<>chair with LRAD and CGA PT Short Term Goal 3 (Week 1): pt will ambulate 52ft with LRAD and CGA  Skilled Therapeutic Interventions/Progress Updates:   Treatment Session 1 Received pt semi-reclined in bed, pt agreeable to PT treatment, and denied any pain during session. Session with emphasis on functional mobility/transfers, generalized strengthening and endurance, dynamic standing balance/coordination, and gait training. Pt transferred semi-reclined<>sitting R EOB with HOB elevated and supervision. Donned shoes, L AFO, and LUE sling with max A. Pt performed all transfers with hemi walker and CGA throughout session - intermittent cues for foot placement when standing.   Pt transported to/from room in [facility] dependently for time management purposes. Pt ambulated 37ft x 1 and 24ft x 1 with hemi walker and CGA with WC follow. Pt demonstrates improvements in recall of positioning of hemi walker but required mod cues to "step through" with R RLE - pt limited by fatigue. MD arrived for morning rounds and pt reporting "itchiness" along posterior neck. Noted redness/irritation from scratching and sling rubbing on neck, does not appear to look like shingles. Removed sling and discussed wearing only when standing/ambulating. - MD ordering topical cream and RN notified. Returned to room and concluded session with pt sitting in WC, needs within reach, and seatbelt alarm on. Pillow placed under RUE and in R hand for improved hemibody positioning.   Treatment Session 2 Received pt sitting in WC, pt  agreeable to PT treatment, and denied any pain during session. Session with emphasis on functional mobility/transfers, generalized strengthening and endurance, dynamic standing balance/coordination, and gait training. Pt performed all transfers with hemi walker and CGA throughout session - occasional cues for foot positioning when standing. Pt performed TUG with hemi walker and CGA with average of 92 seconds. Pt educated on test results and significance indicating high fall risk and importance of using AD upon discharge - pt verbalized understanding and in agreement.  Trial 1: 85 seconds Trial 2: 108 seconds Trial 3: 83 seconds  Worked on blocked practice mini squats 2x20 with CGA for balance and RUE support on hemi walker with emphasis on quad strength and weight shifting to L. Returned to room and pt's daughter arrived - updated on pt's CLOF and progress in rehab. Concluded session with pt sitting in WC, needs within reach, and seatbelt alarm on. Pillow placed under LUE for improved hemibody positioning.   Treatment Session 3 Received pt sitting in WC, pt agreeable to PT treatment, and denied any pain but reported continued itching along posterior neck - per RN waiting on pharmacy for cream. Session with emphasis on functional mobility/transfers, generalized strengthening and endurance, dynamic standing balance/coordination, and gait training. Pt transported to/from room in [facility] dependently for time management purposes.   Pt performed all transfers with hemi walker and CGA throughout session. L AFO donned but did not don LUE arm sling due to irritation along neck. Pt ambulated 83ft x 1, 13ft x 1, and 6ft x 1 with hemi walker and CGA with extended rest breaks in between trials due to fatigue. Pt requiring min cues to  increase R stride length but able to self correct proper placement of hemi walker. Returned to room and concluded session with pt sitting in WC, needs within reach, and seatbelt alarm on.    Therapy Documentation Precautions:  Precautions Precautions: Fall Precaution/Restrictions Comments: Left hemiplegia CVA prior to admit Restrictions Weight Bearing Restrictions Per Provider Order: No  Therapy/Group: Individual Therapy Marlana Salvage Zaunegger Blima Rich PT, DPT 07/15/2023, 6:53 AM

## 2023-07-15 NOTE — Progress Notes (Addendum)
 PROGRESS NOTE   Subjective/Complaints:  Patient reports some itching rash on his neck where he has been wearing his shoulder sling.  No additional concerns this morning.  Later this afternoon therapy reports a little more shortness of breath on exertion.  No chest pain or baseline shortness of breath.  No chest heaviness.  ROS: Positives per HPI above. Denies fevers, chills, N/V, abdominal pain, SOB, chest pain, ,new weakness or paraesthesias.   + R Hand and R foot cramping --intermittent, nightly + Rash  Objective:   No results found. Recent Labs    07/15/23 0532  WBC 15.2*  HGB 11.1*  HCT 34.0*  PLT 963*   Recent Labs    07/13/23 0730 07/15/23 0532  NA 139 137  K 3.6 4.6  CL 105 104  CO2 25 27  GLUCOSE 143* 114*  BUN 25* 18  CREATININE 1.38* 1.34*  CALCIUM 9.0 9.0    Intake/Output Summary (Last 24 hours) at 07/15/2023 1407 Last data filed at 07/15/2023 1321 Gross per 24 hour  Intake 1075 ml  Output 3250 ml  Net -2175 ml        Physical Exam: Vital Signs Blood pressure 120/71, pulse 79, temperature 99.7 F (37.6 C), resp. rate 18, height 5\' 11"  (1.803 m), weight 87.6 kg, SpO2 100%. Gen: no distress, normal appearing.  Sitting up in wheelchair.  Working with physical therapy HEENT: oral mucosa pink and moist, NCAT Cardio: Reg rate and rhythm. Chest: normal effort, normal rate of breathing.  Clear to auscultation bilaterally.  On room air Abd: soft, non-distended.  Positive bowel sounds, normoactive. Ext: Trace bilateral lower extremity edema, nontender on palpation. Psych: pleasant, normal affect Skin: intact Neuro:     Mental Status: AAOx3, long term memory deficits Speech/Languate: Naming and repetition intact, fluent, follows simple commands CRANIAL NERVES: 2-12 grossly intact Atrophy right hand intrinsic muscles Wearing AFO on left lower extremity   Prior exam MOTOR: RUE: 5/5 Deltoid, 5/5  Biceps, 5/5 Triceps,5/5 Grip LUE: 0/5 Deltoid, 0/5 Biceps, 1-2/5 Triceps, 1/5 Grip--isolated in sling RLE: HF 5/5, KE 5/5, ADF 5/5, APF 5/5 LLE: HF 4-/5, KE 4-/5, ADF 2-/5, APF 4-/5, stable 3/ 30   SENSORY: Altered to LT Lue and LLE   MSK: Increased tone LUE with hand in fist and increased elbow extensor tone Decreased dorsiflexion ROM LLE   Atrophy R hand intrinsic muscles --scar from prior surgery along palm--no sensory or motor deficits notable.  Negative Tinel's   Assessment/Plan: 1. Functional deficits which require 3+ hours per day of interdisciplinary therapy in a comprehensive inpatient rehab setting. Physiatrist is providing close team supervision and 24 hour management of active medical problems listed below. Physiatrist and rehab team continue to assess barriers to discharge/monitor patient progress toward functional and medical goals  Care Tool:  Bathing    Body parts bathed by patient: Chest, Abdomen, Front perineal area, Right upper leg, Left upper leg, Right lower leg, Left lower leg, Face   Body parts bathed by helper: Right arm, Left arm, Buttocks     Bathing assist Assist Level: Moderate Assistance - Patient 50 - 74%     Upper Body Dressing/Undressing Upper body dressing  What is the patient wearing?: Pull over shirt    Upper body assist Assist Level: Moderate Assistance - Patient 50 - 74%    Lower Body Dressing/Undressing Lower body dressing      What is the patient wearing?: Underwear/pull up, Pants     Lower body assist Assist for lower body dressing: Minimal Assistance - Patient > 75%     Toileting Toileting    Toileting assist Assist for toileting: Maximal Assistance - Patient 25 - 49%     Transfers Chair/bed transfer  Transfers assist     Chair/bed transfer assist level: Contact Guard/Touching assist     Locomotion Ambulation   Ambulation assist      Assist level: Contact Guard/Touching assist Assistive device:  Walker-hemi Max distance: 11ft   Walk 10 feet activity   Assist     Assist level: Contact Guard/Touching assist Assistive device: Walker-hemi   Walk 50 feet activity   Assist    Assist level: Contact Guard/Touching assist Assistive device: Walker-hemi    Walk 150 feet activity   Assist Walk 150 feet activity did not occur: Safety/medical concerns (fatigue)         Walk 10 feet on uneven surface  activity   Assist Walk 10 feet on uneven surfaces activity did not occur: Safety/medical concerns (fatigue)         Wheelchair     Assist Is the patient using a wheelchair?: Yes Type of Wheelchair: Manual    Wheelchair assist level: Supervision/Verbal cueing Max wheelchair distance: 90    Wheelchair 50 feet with 2 turns activity    Assist        Assist Level: Supervision/Verbal cueing   Wheelchair 150 feet activity     Assist      Assist Level: Maximal Assistance - Patient 25 - 49%   Blood pressure 120/71, pulse 79, temperature 99.7 F (37.6 C), resp. rate 18, height 5\' 11"  (1.803 m), weight 87.6 kg, SpO2 100%.  Medical Problem List and Plan: 1. Functional deficits secondary to debility due to ITP             -patient may  shower             -ELOS/Goals: 7-10 days, Sup to Min a PT/OT/SLP             -Stable to continue CIR  -Left AFO was ordered  -Team conference tomorrow  -will order chest x-ray due to increased dyspnea with exertion reported this afternoon 2.  Antithrombotics: -DVT/anticoagulation:  Pharmaceutical: Xarelto             -antiplatelet therapy: ASA 3.Right foot neuropathic pain: scheduled for outpatient Qutenza. Tylenol prn. Continue gabapentin. Continue Norco PRN.   3-29: Pain well-controlled on current regimen  4. Mood/Behavior/Sleep: LCSW to follow for evaluation and support.              -antipsychotic agents: N/A 5. Neuropsych/cognition: This patient is capable of making decisions on his own behalf. 6.  Skin/Wound Care: Routine pressure relief measures.  7. Fluids/Electrolytes/Nutrition: Strict I/O. Daily CMET per hem/Onc 8.  Chronic ITP w/relapse: Has been received Decadron, IVIG and Nplate             --plans for rituxan on outpatient basis per Dr. Myna Hidalgo             --continue daily CMET/CBC X 4 days per input from Dr. Myna Hidalgo.    - Per Dr. Myna Hidalgo, expected platelet increase, will drop again as OP with  Rituxin initiation  -4/1 platelets elevated 963, discussed with Dr. Myna Hidalgo.  No changes necessary, he will see tomorrow.  Appreciate his assistance  9. Epistaxis: Nasal packing--augmentin X 3 d/c on 03/25. 10 .Prediabetes: Hgb A1C- 6.2. Elevated due to steriods but trending down. --Continue to monitor BS ac/hs and use SSI for elevated BS -4/1 glucose stable on CBG  11. H/o CVA X 2 w/neurogenic bladder: On Xarelto and ASA.             --At baseline completes intermittent self cath several times a day.  12.Seizure d/o: Seizure free on Keppra 1500 mg bid 13. Anxiety/depression: On Buropion and Sertraline 14. CKD 3A: BUN/SCr 24/1.3--> Baseline SCr 1.3-->40/1.17. Avoid nephrotoxic medications. Repeat tomorrow since trending upward  -3-30: Continues to downtrend, BUN 25,, creatinine 1.38.  Essentially baseline.  -4/1 BUN and creatinine improved at 18/1.34 .  16. Leucocytosis: May be steroid related. WBC reviewed and is stable, repeat tomorrow   - stable 14-16  17. Essential HTN. Monitor BP. Continue norvasc, add on magnesium level  -4/1 controlled continue to monitor    07/15/2023    1:06 PM 07/15/2023    6:03 AM 07/15/2023    5:51 AM  Vitals with BMI  Weight  193 lbs 2 oz   BMI  26.95   Systolic 120  135  Diastolic 71  71  Pulse 79  75    18. Spasticity. Continue baclofen  19. Suboptimal vitamin D: D3 ordered, increase to 2,000U daily  20. Transaminitis: improved, prn tylenol added back    - AST normalized, ALT improving  21.  Cramping in right hand and foot--worse at  nighttime. ?  RLS versus CTS and hand.  -Add nightly magnesium gluconate 250 mg; get mag levels in a.m.  -Discussed trial of neutral wrist brace as outpatient for at least 6 weeks 22.  Small rash on his neck.  Possibly from irritation from shoulder sling.  Does not appear like shingles to me today.  -Will start hydrocortisone cream  LOS: 6 days A FACE TO FACE EVALUATION WAS PERFORMED  Fanny Dance 07/15/2023, 2:07 PM

## 2023-07-15 NOTE — Progress Notes (Signed)
 Orthopedic Tech Progress Note Patient Details:  Mark [name] July 27, 1945 [id number]  Called in order to [facility] for an AFO CONSULT   Patient ID: Mark [name], male   DOB: [birth year], 78 y.o.   MRN: [id number]  [name] 07/15/2023, 8:37 AM

## 2023-07-16 DIAGNOSIS — R21 Rash and other nonspecific skin eruption: Secondary | ICD-10-CM

## 2023-07-16 DIAGNOSIS — R0602 Shortness of breath: Secondary | ICD-10-CM

## 2023-07-16 MED ORDER — HYDROCORTISONE 0.5 % EX CREA
TOPICAL_CREAM | Freq: Two times a day (BID) | CUTANEOUS | Status: AC
  Filled 2023-07-16: qty 28.35

## 2023-07-16 NOTE — Progress Notes (Signed)
 Physical Therapy Session Note  Patient Details  Name: Mark [name] MRN: [id number] Date of Birth: 1946/01/30  Today's Date: 07/16/2023 PT Individual Time: 3244-0102 PT Individual Time Calculation (min): 43 min   Short Term Goals: Week 1:  PT Short Term Goal 1 (Week 1): pt will transfer sit<>stand with LRAD and CGA PT Short Term Goal 2 (Week 1): pt will transfer bed<>chair with LRAD and CGA PT Short Term Goal 3 (Week 1): pt will ambulate 69ft with LRAD and CGA  Skilled Therapeutic Interventions/Progress Updates:      Pt sitting in wheelchair and agreeable to therapy treatment. He has no complaints of pain.  Transported patient to main rehab gym. Focused session on gait training and AFO consult with [facility]. Thayer Ohm, [facility], present to assist with determining patient needs. Patient and [facility] well familiar with each other from prior CVA several years ago, CPO reports building several custom AFO's for patient. Patient reports biggest barrier to compliance with wearing AFO was caregiver burden for donning/doffing. Determined patient to be fitted for an Elevate AFO PLS with supination strap. CPO suggested ensuring top strap of velcro shoe is tight and secure to avoid pistoning in the heel due to his significant tone.   Pt Completed gait training during session with HW vs RW. CGA overall for both with patient demonstrating a step-to pattern with the [facility] and occasionally having some difficulty with overcrowding and narrow BOS. Introduced RW to improve Animator and fluiditiy - patient ambulated ~34ft + ~124ft with CGA and RW - needed assistance for getting LUE to grip walker and min cues for keeping body within walker frame. Gait much more efficient and natural compared to [facility].   Pt returned to his room at the end of treatment. He was left sitting upright with 1/2 lap tray supporting LUE. All needs met, call bell in lap.     Therapy Documentation Precautions:  Precautions Precautions:  Fall Precaution/Restrictions Comments: Left hemiplegia CVA prior to admit Restrictions Weight Bearing Restrictions Per Provider Order: No General:    Therapy/Group: Individual Therapy  Orrin Brigham 07/16/2023, 7:54 AM

## 2023-07-16 NOTE — Progress Notes (Signed)
 PROGRESS NOTE   Subjective/Complaints:  Seen by neuropsychology today.  Had some shortness of breath with activity yesterday, improved today.   ROS: Positives per HPI above. Denies fevers, chills, N/V, abdominal pain, chest pain, ,new weakness or paraesthesias.   + R Hand and R foot cramping --intermittent, nightly + Rash- improved + SOB with activity- improved  Objective:   DG Chest 2 View Result Date: 07/15/2023 CLINICAL DATA:  Shortness of breath EXAM: CHEST - 2 VIEW COMPARISON:  06/29/2020 FINDINGS: The heart size and mediastinal contours are within normal limits. Both lungs are clear. The visualized skeletal structures are unremarkable. IMPRESSION: No active cardiopulmonary disease. Electronically Signed   By: Charlett Nose M.D.   On: 07/15/2023 20:01   Recent Labs    07/15/23 0532  WBC 15.2*  HGB 11.1*  HCT 34.0*  PLT 963*   Recent Labs    07/15/23 0532  NA 137  K 4.6  CL 104  CO2 27  GLUCOSE 114*  BUN 18  CREATININE 1.34*  CALCIUM 9.0    Intake/Output Summary (Last 24 hours) at 07/16/2023 1139 Last data filed at 07/16/2023 1130 Gross per 24 hour  Intake 1197 ml  Output 2950 ml  Net -1753 ml        Physical Exam: Vital Signs Blood pressure 133/68, pulse 71, temperature 98.3 F (36.8 C), temperature source Oral, resp. rate 18, height 5\' 11"  (1.803 m), weight 86.1 kg, SpO2 97%. Gen: no distress, normal appearing.  Sitting up in wheelchair.  Working with physical therapy HEENT: oral mucosa pink and moist, NCAT Cardio: Reg rate and rhythm. Chest: normal effort, normal rate of breathing.  Clear to auscultation bilaterally.  On room air Abd: soft, non-distended.  Positive bowel sounds, normoactive. Ext: Trace bilateral lower extremity edema, nontender on palpation. Psych: pleasant, normal affect Skin: small rash on neck- improved from yesterday  Neuro:     Mental Status: AAOx3, long term memory  deficits Speech/Languate: Naming and repetition intact, fluent, follows simple commands CRANIAL NERVES: 2-12 grossly intact Atrophy right hand intrinsic muscles Wearing AFO on left lower extremity   Prior exam MOTOR: RUE: 5/5 Deltoid, 5/5 Biceps, 5/5 Triceps,5/5 Grip LUE: 0/5 Deltoid, 0/5 Biceps, 1-2/5 Triceps, 1/5 Grip--isolated in sling RLE: HF 5/5, KE 5/5, ADF 5/5, APF 5/5 LLE: HF 4-/5, KE 4-/5, ADF 2-/5, APF 4-/5, stable 3/ 30   SENSORY: Altered to LT Lue and LLE   MSK: Increased tone LUE with hand in fist and increased elbow extensor tone Decreased dorsiflexion ROM LLE   Atrophy R hand intrinsic muscles --scar from prior surgery along palm--no sensory or motor deficits notable.  Negative Tinel's   Assessment/Plan: 1. Functional deficits which require 3+ hours per day of interdisciplinary therapy in a comprehensive inpatient rehab setting. Physiatrist is providing close team supervision and 24 hour management of active medical problems listed below. Physiatrist and rehab team continue to assess barriers to discharge/monitor patient progress toward functional and medical goals  Care Tool:  Bathing    Body parts bathed by patient: Chest, Abdomen, Front perineal area, Right upper leg, Left upper leg, Right lower leg, Left lower leg, Face   Body parts bathed by helper:  Right arm, Left arm, Buttocks     Bathing assist Assist Level: Moderate Assistance - Patient 50 - 74%     Upper Body Dressing/Undressing Upper body dressing   What is the patient wearing?: Pull over shirt    Upper body assist Assist Level: Moderate Assistance - Patient 50 - 74%    Lower Body Dressing/Undressing Lower body dressing      What is the patient wearing?: Underwear/pull up, Pants     Lower body assist Assist for lower body dressing: Minimal Assistance - Patient > 75%     Toileting Toileting    Toileting assist Assist for toileting: Maximal Assistance - Patient 25 - 49%      Transfers Chair/bed transfer  Transfers assist     Chair/bed transfer assist level: Contact Guard/Touching assist     Locomotion Ambulation   Ambulation assist      Assist level: Contact Guard/Touching assist Assistive device: Walker-hemi Max distance: 73ft   Walk 10 feet activity   Assist     Assist level: Contact Guard/Touching assist Assistive device: Walker-hemi   Walk 50 feet activity   Assist    Assist level: Contact Guard/Touching assist Assistive device: Walker-hemi    Walk 150 feet activity   Assist Walk 150 feet activity did not occur: Safety/medical concerns (fatigue)         Walk 10 feet on uneven surface  activity   Assist Walk 10 feet on uneven surfaces activity did not occur: Safety/medical concerns (fatigue)         Wheelchair     Assist Is the patient using a wheelchair?: Yes Type of Wheelchair: Manual    Wheelchair assist level: Supervision/Verbal cueing Max wheelchair distance: 90    Wheelchair 50 feet with 2 turns activity    Assist        Assist Level: Supervision/Verbal cueing   Wheelchair 150 feet activity     Assist      Assist Level: Maximal Assistance - Patient 25 - 49%   Blood pressure 133/68, pulse 71, temperature 98.3 F (36.8 C), temperature source Oral, resp. rate 18, height 5\' 11"  (1.803 m), weight 86.1 kg, SpO2 97%.  Medical Problem List and Plan: 1. Functional deficits secondary to debility due to ITP             -patient may  shower             -ELOS/Goals: 7-10 days, Sup to Min a PT/OT/SLP             -Stable to continue CIR  -Left AFO was ordered  -Team conference tomorrow  -will order chest x-ray due to increased dyspnea with exertion reported this afternoon- negative  -Team conference today please see physician documentation under team conference tab, met with team  to discuss problems,progress, and goals. Formulized individual treatment plan based on medical history,  underlying problem and comorbidities.   2.  Antithrombotics: -DVT/anticoagulation:  Pharmaceutical: Xarelto             -antiplatelet therapy: ASA 3.Right foot neuropathic pain: scheduled for outpatient Qutenza. Tylenol prn. Continue gabapentin. Continue Norco PRN.   3-29: Pain well-controlled on current regimen  4. Mood/Behavior/Sleep: LCSW to follow for evaluation and support.              -antipsychotic agents: N/A 5. Neuropsych/cognition: This patient is capable of making decisions on his own behalf. 6. Skin/Wound Care: Routine pressure relief measures.  7. Fluids/Electrolytes/Nutrition: Strict I/O. Daily CMET per hem/Onc 8.  Chronic ITP w/relapse: Has been received Decadron, IVIG and Nplate             --plans for rituxan on outpatient basis per Dr. Myna Hidalgo             --continue daily CMET/CBC X 4 days per input from Dr. Myna Hidalgo.    - Per Dr. Myna Hidalgo, expected platelet increase, will drop again as OP with Rituxin initiation  -4/1 platelets elevated 963, discussed with Dr. Myna Hidalgo.  No changes necessary, he will see tomorrow.  Appreciate his assistance  -4/2 Seen by Dr. Myna Hidalgo, appreciate assistance, continue to monitor CBC 2-3 times a week  9. Epistaxis: Nasal packing--augmentin X 3 d/c on 03/25. 10 .Prediabetes: Hgb A1C- 6.2. Elevated due to steriods but trending down. --Continue to monitor BS ac/hs and use SSI for elevated BS -4/1 glucose stable on CBG  11. H/o CVA X 2 w/neurogenic bladder: On Xarelto and ASA.             --At baseline completes intermittent self cath several times a day.  12.Seizure d/o: Seizure free on Keppra 1500 mg bid 13. Anxiety/depression: On Buropion and Sertraline 14. CKD 3A: BUN/SCr 24/1.3--> Baseline SCr 1.3-->40/1.17. Avoid nephrotoxic medications. Repeat tomorrow since trending upward  -3-30: Continues to downtrend, BUN 25,, creatinine 1.38.  Essentially baseline.  -4/1 BUN and creatinine improved at 18/1.34 .  16. Leucocytosis: May be steroid  related. WBC reviewed and is stable, repeat tomorrow   - stable 14-16  17. Essential HTN. Monitor BP. Continue norvasc, add on magnesium level  -4/1 controlled continue to monitor  -4/2 BP controlled, continue to monitor     07/16/2023    3:49 AM 07/15/2023    9:45 PM 07/15/2023    8:19 PM  Vitals with BMI  Weight 189 lbs 13 oz    BMI 26.49    Systolic 133 136 914  Diastolic 68 78 78  Pulse 71  78    18. Spasticity. Continue baclofen  19. Suboptimal vitamin D: D3 ordered, increase to 2,000U daily  20. Transaminitis: improved, prn tylenol added back    - AST normalized, ALT improving  21.  Cramping in right hand and foot--worse at nighttime. ?  RLS versus CTS and hand.  -Add nightly magnesium gluconate 250 mg; get mag levels in a.m.  -Discussed trial of neutral wrist brace as outpatient for at least 6 weeks 22.  Small rash on his neck.  Possibly from irritation from shoulder sling.  Does not appear like shingles to me today.  -Will start hydrocortisone cream  -4/2 continue current regimen, improved   LOS: 7 days A FACE TO FACE EVALUATION WAS PERFORMED  Fanny Dance 07/16/2023, 11:39 AM

## 2023-07-16 NOTE — Progress Notes (Signed)
 Physical Therapy Session Note  Patient Details  Name: Mark [name] MRN: [id number] Date of Birth: 10-26-1945  Today's Date: 07/16/2023 PT Individual Time: 0731-0840 PT Individual Time Calculation (min): 69 min   Short Term Goals: Week 1:  PT Short Term Goal 1 (Week 1): pt will transfer sit<>stand with LRAD and CGA PT Short Term Goal 2 (Week 1): pt will transfer bed<>chair with LRAD and CGA PT Short Term Goal 3 (Week 1): pt will ambulate 88ft with LRAD and CGA  Skilled Therapeutic Interventions/Progress Updates:   Received pt semi-reclined in bed with RN administering medications. Pt agreeable to PT treatment and reported pain 2/10 in R shoulder - declined any pain medication. Session with emphasis on functional mobility/transfers, toileting, dressing, generalized strengthening and endurance, dynamic standing balance/coordination, and stair navigation. Pt transferred supine<>sitting R EOB from flat bed with bedrails and supervision (to simulate bed at home). Donned pants sitting EOB with max A to thread LLE through but pt able to thread RLE through with increased time but no assist. Donned shoes and L AFO with max A (left sling off due to irritation in posterior neck) and pt performed all transfers with hemi walker and CGA throughout session - cues for L foot placement when standing. Stood to pull pants over hips but noted pt incontinent of bowel. Returned to sitting and removed pants and soiled brief. Pt reported not being able to recognize urge for BM over the past few days. Encouraged sitting on commode - pt ambulated in/out of bathroom with hemi walker and CGA and pt with continued loose stool. Performed hygiene management seated with set up assist and donned brief and pants with max A for time management purposes. Pt required mod A to pull clean brief/pants over hips. Sat in WC at sink and performed hand hygiene with set up assist.   Pt transported to/from room in [facility] dependently for time  management purposes. Pt navigated 4 6in steps with 1 handrail x 2 trials - trial 1 with min A and trial 2 with CGA with cues for "up with the good, down with the bad" technique. Pt with poor control with placement of LLE, frequently hitting it against step. Pt SOB afterwards - HR 91bpm and SPO2 98%; symptoms improved with seated rest break. Returned to room and discussed AFO consult at 1pm with [facility]. Concluded session with pt sitting in WC, needs within reach, and seatbelt alarm on. LUE supported on pillow for improved hemibody positioning.   Therapy Documentation Precautions:  Precautions Precautions: Fall Precaution/Restrictions Comments: Left hemiplegia CVA prior to admit Restrictions Weight Bearing Restrictions Per Provider Order: No  Therapy/Group: Individual Therapy Marlana Salvage Zaunegger Blima Rich PT, DPT 07/16/2023, 6:53 AM

## 2023-07-16 NOTE — Progress Notes (Signed)
 Patient ID: Mark [name], male   DOB: [birth year], 78 y.o.   MRN: [id number]  Met with pt and spoke with wife via telephone to give both team conference update with goals of supervision level and target discharge date of 4/8. Discussed with wife coming in for education and have scheduled for Friday form 1:00-3:00 pm. Both pleaed with his progress here and ramp is being built today. Continue to work on discharge needs.

## 2023-07-16 NOTE — Consult Note (Signed)
 [name] is well-known to me.  He is a very nice 78 year old white male.  He has 2 separate problems.  He has relapsed ITP.  He also has history of CVA secondary to lupus anticoagulant.  He recently had another relapse of his ITP.  [facility].  He was treated with Decadron/Nplate/IVIG.  When he came into the hospital, his platelet count was 15,000.  His platelet count went up quite nicely.  He subsequently was transferred over to the [facility].  He is doing well.  He is working quite hard.  He has a past history of CVA with left-sided weakness.  Once his platelet counts are going up, we will get him back on his Xarelto and aspirin.  Of note, on 07/07/2023, the platelet count was 86,000.  3 days later platelet count was 4 and 65,000.  Yesterday, the platelet count was 963,000.  Again, this happened to him before.  However, his platelet count went down quite quickly.  I know that his platelet count will start trending downward.  I am not sure why this will happen.  Again he is on the aspirin and the Xarelto.  He has had no bleeding.  I will know if there is any urinary issues.  It looks like there might be some urinary retention.  He has had no fever.  His appetite is doing okay.  He has had no nausea or vomiting.   On his physical exam, his vital signs show temperature of 98.3.  Pulse 71.  Blood pressure 133/68.  His head and neck exam shows no ocular or oral lesions.  He has no intraoral petechia.  There is no adenopathy in the neck.  Lungs are clear bilaterally.  Cardiac exam regular rate and rhythm.  He has no murmurs.  Abdomen is soft.  Bowel sounds are present.  There is no fluid wave.  There is no palpable liver or spleen tip.  Extremities shows the weakness over on the left side.  Right side has good strength.  Skin exam shows some scattered ecchymoses.  I do not see any petechia.  Neurological exam again shows the weakness on the left side.  At this point, we  decided to monitor his CBC.  I will check his CBC 2 or 3 times a week.  Again I expect that his platelet count will start dropping.  He needs to get Rituxan to try to help minimize the relapses.  We will do this once he is out of the hospital.  I would just keep him on the Xarelto and aspirin.  I know that he is working incredibly hard over at [employer].  I know the staff there are doing a fantastic job with him.  Hopefully, he will be able to go home soon.   Christin Bach, MD  Hebrew[place] 12:12

## 2023-07-16 NOTE — Plan of Care (Signed)
  Problem: RH BOWEL ELIMINATION Goal: RH STG MANAGE BOWEL WITH ASSISTANCE Description: STG Manage Bowel with  supervision Assistance. Outcome: Progressing   Problem: RH BLADDER ELIMINATION Goal: RH STG MANAGE BLADDER WITH ASSISTANCE Description: STG Manage Bladder With supervision Assistance Outcome: Progressing   Problem: RH SKIN INTEGRITY Goal: RH STG SKIN FREE OF INFECTION/BREAKDOWN Description: Manage skin free of infection/breakdown with supervision assistance Outcome: Progressing   Problem: RH SAFETY Goal: RH STG ADHERE TO SAFETY PRECAUTIONS W/ASSISTANCE/DEVICE Description: STG Adhere to Safety Precautions With  supervision Assistance/Device. Outcome: Progressing   Problem: RH PAIN MANAGEMENT Goal: RH STG PAIN MANAGED AT OR BELOW PT'S PAIN GOAL Description: <4 w/ prns Outcome: Progressing

## 2023-07-16 NOTE — Progress Notes (Signed)
 Occupational Therapy Weekly Progress Note  Patient Details  Name: Mark [name] MRN: [id number] Date of Birth: 11/25/1945  Beginning of progress report period: July 10, 2023 End of progress report period: July 16, 2023   Patient has met 2 of 3 short term goals. Pt is making steady progress towards LTGs. He is able to bathe at an overall CGA level, dress LB with min A and requires min A for toileting tasks. Pt continues to demonstrate dynamic standing balance, functional endurance, and GM coordination deficits along with baseline dense LUE > LLE hemiplegia resulting in difficulty completing BADL tasks without increased physical assist. Pt will benefit from continued skilled OT services to focus on mentioned deficits. Family ed not initiated as of date and will be needed in prep for DC.   Patient continues to demonstrate the following deficits: muscle weakness, decreased cardiorespiratoy endurance, decreased coordination and decreased motor planning, and decreased standing balance and hemiplegia and therefore will continue to benefit from skilled OT intervention to enhance overall performance with BADL, iADL, and Reduce care partner burden.  Patient progressing toward long term goals..  Continue plan of care.  OT Short Term Goals Week 1:  OT Short Term Goal 1 (Week 1): Pt will perform tub bench transfer into bathtub with contact guard with cues OT Short Term Goal 1 - Progress (Week 1): Progressing toward goal OT Short Term Goal 2 (Week 1): Pt will don shirt with supervision OT Short Term Goal 2 - Progress (Week 1): Met OT Short Term Goal 3 (Week 1): Pt will be able to stand with contact guard for 3 minutes in prep for cathing in standing position OT Short Term Goal 3 - Progress (Week 1): Met Week 2:  OT Short Term Goal 1 (Week 2): Pt will donn L shoe/AFO with min A OT Short Term Goal 2 (Week 2): Pt will perform tub bench transfer into bathtub with contact guard with cues    Melvyn Novas, MS, OTR/L  07/16/2023, 8:00 AM

## 2023-07-16 NOTE — Progress Notes (Addendum)
 Occupational Therapy Session Note  Patient Details  Name: Mark [name] MRN: [id number] Date of Birth: 02/10/1946  Today's Date: 07/16/2023 OT Individual Time: 1036-1105 OT Individual Time Calculation (min): 29 min    Short Term Goals: Week 1:  OT Short Term Goal 1 (Week 1): Pt will perform tub bench transfer into bathtub with contact guard with cues OT Short Term Goal 1 - Progress (Week 1): Progressing toward goal OT Short Term Goal 2 (Week 1): Pt will don shirt with supervision OT Short Term Goal 2 - Progress (Week 1): Met OT Short Term Goal 3 (Week 1): Pt will be able to stand with contact guard for 3 minutes in prep for cathing in standing position OT Short Term Goal 3 - Progress (Week 1): Met  Skilled Therapeutic Interventions/Progress Updates:  Pt greeted seated in w/c, pt agreeable to OT intervention.      NMR:  Session focused on LUE coordination via estim.   1:1 NMES applied to L elbow flexors at the below settings:    Ratio 1:3 Rate 35 pps Waveform- Asymmetric Ramp 1.0 Pulse 300 Intensity- 24  Duration -   15 mins  Emphasis on bringing cup towards mouth when channel activated, cup placed in L hand with total A, once cup placed, pt was able maintain grasp on cup to facilitate improved hand to mouth pattern for ADL participation.   Provided PROM to LUE for pain mgmt. Pt reports compliance with L resting hand splint at night with pt also reporting the he completes PROM at home.    Ended session with pt seated in w/c with all needs within reach and safety belt alarm activated.                    Therapy Documentation Precautions:  Precautions Precautions: Fall Precaution/Restrictions Comments: Left hemiplegia CVA prior to admit Restrictions Weight Bearing Restrictions Per Provider Order: No  Pain: no pain     Therapy/Group: Individual Therapy  Pollyann Glen [facility] 07/16/2023, 12:30 PM

## 2023-07-16 NOTE — Progress Notes (Signed)
 Occupational Therapy Session Note  Patient Details  Name: Mark [name] MRN: [id number] Date of Birth: 1946-04-11  Today's Date: 07/16/2023 OT Individual Time: 0905-1000 OT Individual Time Calculation (min): 55 min    Short Term Goals: Week 1:  OT Short Term Goal 1 (Week 1): Pt will perform tub bench transfer into bathtub with contact guard with cues OT Short Term Goal 2 (Week 1): Pt will don shirt with supervision OT Short Term Goal 3 (Week 1): Pt will be able to stand with contact guard for 3 minutes in prep for cathing in standing position  Skilled Therapeutic Interventions/Progress Updates:  Skilled OT intervention completed with focus on tub/shower transfers, DC planning, dynamic standing balance. Pt received seated in w/c, agreeable to session. No pain reported.  Pt reports bathroom set up as tub/shower with curtain. Pt reports he also has TTB and uses at baseline. Pt was able to complete min A sit > stand with hemi walker, then CGA ambulatory transfer > TTB. Supervision for pivoting in/out of tub. Min A needed to stand using hemi walker and ambulatory transfer with CGA back to w/c. Cues needed for RLE positioning. Demonstrated method of tucking shower curtain under buttocks to prevent water spillage in floor. Discussed using lateral leans for peri-washing and use/installation of grab bars for balance.   Pt participated in the following dynamic standing balance and endurance tasks to promote independence and safety during BADLs and functional mobility: -cornhole toss activity. Min A sit > stand without AD, and CGA/min A needed for balance  OT applied foam dressing to L elbow as pt with redness from elbow contacting w/c arm rest. Retrieved 1/2 lap tray and applied to w/c for LUE hemi positioning. Back in room, pt remained seated in w/c, with belt alarm on/activated, and with all needs in reach at end of session.   Therapy Documentation Precautions:  Precautions Precautions:  Fall Precaution/Restrictions Comments: Left hemiplegia CVA prior to admit Restrictions Weight Bearing Restrictions Per Provider Order: No   Therapy/Group: Individual Therapy  Melvyn Novas, MS, OTR/L  07/16/2023, 12:13 PM

## 2023-07-17 ENCOUNTER — Other Ambulatory Visit: Payer: Medicare HMO

## 2023-07-17 ENCOUNTER — Ambulatory Visit: Payer: Medicare HMO | Admitting: Hematology & Oncology

## 2023-07-17 DIAGNOSIS — D693 Immune thrombocytopenic purpura: Secondary | ICD-10-CM

## 2023-07-17 LAB — FACTOR 5 LEIDEN

## 2023-07-17 LAB — CBC WITH DIFFERENTIAL/PLATELET
Abs Immature Granulocytes: 0.14 10*3/uL — ABNORMAL HIGH (ref 0.00–0.07)
Basophils Absolute: 0 10*3/uL (ref 0.0–0.1)
Basophils Relative: 0 %
Eosinophils Absolute: 0.3 10*3/uL (ref 0.0–0.5)
Eosinophils Relative: 3 %
HCT: 32.9 % — ABNORMAL LOW (ref 39.0–52.0)
Hemoglobin: 10.7 g/dL — ABNORMAL LOW (ref 13.0–17.0)
Immature Granulocytes: 1 %
Lymphocytes Relative: 24 %
Lymphs Abs: 2.5 10*3/uL (ref 0.7–4.0)
MCH: 30.5 pg (ref 26.0–34.0)
MCHC: 32.5 g/dL (ref 30.0–36.0)
MCV: 93.7 fL (ref 80.0–100.0)
Monocytes Absolute: 2.3 10*3/uL — ABNORMAL HIGH (ref 0.1–1.0)
Monocytes Relative: 22 %
Neutro Abs: 5.3 10*3/uL (ref 1.7–7.7)
Neutrophils Relative %: 50 %
Platelets: 890 10*3/uL — ABNORMAL HIGH (ref 150–400)
RBC: 3.51 MIL/uL — ABNORMAL LOW (ref 4.22–5.81)
RDW: 18.3 % — ABNORMAL HIGH (ref 11.5–15.5)
WBC: 10.5 10*3/uL (ref 4.0–10.5)
nRBC: 0.6 % — ABNORMAL HIGH (ref 0.0–0.2)

## 2023-07-17 LAB — BASIC METABOLIC PANEL WITH GFR
Anion gap: 10 (ref 5–15)
BUN: 25 mg/dL — ABNORMAL HIGH (ref 8–23)
CO2: 23 mmol/L (ref 22–32)
Calcium: 8.7 mg/dL — ABNORMAL LOW (ref 8.9–10.3)
Chloride: 105 mmol/L (ref 98–111)
Creatinine, Ser: 1.44 mg/dL — ABNORMAL HIGH (ref 0.61–1.24)
GFR, Estimated: 50 mL/min — ABNORMAL LOW (ref >60)
Glucose, Bld: 105 mg/dL — ABNORMAL HIGH (ref 70–99)
Potassium: 3.6 mmol/L (ref 3.5–5.1)
Sodium: 138 mmol/L (ref 135–145)

## 2023-07-17 LAB — PROTHROMBIN GENE MUTATION

## 2023-07-17 NOTE — Progress Notes (Signed)
 Physical Therapy Session Note  Patient Details  Name: Mark [name] MRN: [id number] Date of Birth: Sep 07, 1945  Today's Date: 07/17/2023 PT Individual Time: 1400-1440 PT Individual Time Calculation (min): 40 min   Short Term Goals: Week 1:  PT Short Term Goal 1 (Week 1): pt will transfer sit<>stand with LRAD and CGA PT Short Term Goal 2 (Week 1): pt will transfer bed<>chair with LRAD and CGA PT Short Term Goal 3 (Week 1): pt will ambulate 69ft with LRAD and CGA  Skilled Therapeutic Interventions/Progress Updates:    Session focused on discussion and education in regards to pt personal goals. His biggest goal he states at the moment is to increase his independence with catheterizations in preparation for home (he was independent prior). Discussed the home set up and sequence needed as well as the mobility requirements (he performs in standing). Performed breakdown of parts including the transfer, transition, simulated clothing management to address the mobility and balance piece. Pt required overall CGA to minA with HW and extra time for transitions. Also simulated toilet transfer in case of BM as well. Pt drew picture of home environment of bathroom where grab bars are located.   Notified RN case manager of pt's request and goal to work on cathing himself.  Therapy Documentation Precautions:  Precautions Precautions: Fall Precaution/Restrictions Comments: Left hemiplegia CVA prior to admit Restrictions Weight Bearing Restrictions Per Provider Order: No   Pain:  Denies pain   Therapy/Group: Individual Therapy  Karolee Stamps Darrol Poke, PT, DPT, CBIS  07/17/2023, 3:21 PM

## 2023-07-17 NOTE — Progress Notes (Signed)
 PROGRESS NOTE   Subjective/Complaints:  No new complaints or concerns today.  Itching on his neck has improved.  Reports he is sleeping okay at night.  LBM today.  He is happy with progress with therapy team.  Shortness of breath continues to be improved.  ROS: Positives per HPI above. Denies fevers, chills, N/V, abdominal pain, chest pain, ,new weakness or paraesthesias.   + R Hand and R foot cramping --intermittent, nightly + Rash- improved + SOB with activity-continues to be improved  Objective:   DG Chest 2 View Result Date: 07/15/2023 CLINICAL DATA:  Shortness of breath EXAM: CHEST - 2 VIEW COMPARISON:  06/29/2020 FINDINGS: The heart size and mediastinal contours are within normal limits. Both lungs are clear. The visualized skeletal structures are unremarkable. IMPRESSION: No active cardiopulmonary disease. Electronically Signed   By: Charlett Nose M.D.   On: 07/15/2023 20:01   Recent Labs    07/15/23 0532 07/17/23 0629  WBC 15.2* 10.5  HGB 11.1* 10.7*  HCT 34.0* 32.9*  PLT 963* 890*   Recent Labs    07/15/23 0532 07/17/23 0629  NA 137 138  K 4.6 3.6  CL 104 105  CO2 27 23  GLUCOSE 114* 105*  BUN 18 25*  CREATININE 1.34* 1.44*  CALCIUM 9.0 8.7*    Intake/Output Summary (Last 24 hours) at 07/17/2023 1442 Last data filed at 07/17/2023 1200 Gross per 24 hour  Intake 473 ml  Output 1826 ml  Net -1353 ml        Physical Exam: Vital Signs Blood pressure 112/64, pulse 81, temperature 98.3 F (36.8 C), resp. rate 20, height 5\' 11"  (1.803 m), weight 87.9 kg, SpO2 96%. Gen: no distress, normal appearing.  Sitting up in wheelchair.  Working with physical therapy HEENT: oral mucosa pink and moist, NCAT Cardio: Reg rate and rhythm. Chest: normal effort, normal rate of breathing.  Clear to auscultation bilaterally.  On room air Abd: soft, non-distended.  Positive bowel sounds, normoactive. Ext: Trace bilateral lower  extremity edema, nontender on palpation. Psych: pleasant, normal affect Skin: small rash on neck-a little better today  Neuro:     Mental Status: Alert and awake, long term memory deficits Speech/Languate: Follows commands CRANIAL NERVES: 2-12 grossly intact-other than hard of hearing Atrophy right hand intrinsic muscles Wearing AFO on left lower extremity   Prior exam MOTOR: RUE: 5/5 Deltoid, 5/5 Biceps, 5/5 Triceps,5/5 Grip LUE: 0/5 Deltoid, 0/5 Biceps, 1-2/5 Triceps, 1/5 Grip--isolated in sling RLE: HF 5/5, KE 5/5, ADF 5/5, APF 5/5 LLE: HF 4-/5, KE 4-/5, ADF 2-/5, APF 4-/5, stable 3/ 30   SENSORY: Altered to LT Lue and LLE   MSK: Increased tone LUE with hand in fist and increased elbow extensor tone Decreased dorsiflexion ROM LLE   Atrophy R hand intrinsic muscles --scar from prior surgery along palm--no sensory or motor deficits notable.  Negative Tinel's   Assessment/Plan: 1. Functional deficits which require 3+ hours per day of interdisciplinary therapy in a comprehensive inpatient rehab setting. Physiatrist is providing close team supervision and 24 hour management of active medical problems listed below. Physiatrist and rehab team continue to assess barriers to discharge/monitor patient progress toward functional and  medical goals  Care Tool:  Bathing    Body parts bathed by patient: Chest, Abdomen, Front perineal area, Right upper leg, Left upper leg, Right lower leg, Left lower leg, Face   Body parts bathed by helper: Right arm, Left arm, Buttocks     Bathing assist Assist Level: Moderate Assistance - Patient 50 - 74%     Upper Body Dressing/Undressing Upper body dressing   What is the patient wearing?: Pull over shirt    Upper body assist Assist Level: Moderate Assistance - Patient 50 - 74%    Lower Body Dressing/Undressing Lower body dressing      What is the patient wearing?: Underwear/pull up, Pants     Lower body assist Assist for lower body  dressing: Minimal Assistance - Patient > 75%     Toileting Toileting    Toileting assist Assist for toileting: Maximal Assistance - Patient 25 - 49%     Transfers Chair/bed transfer  Transfers assist     Chair/bed transfer assist level: Contact Guard/Touching assist     Locomotion Ambulation   Ambulation assist      Assist level: Contact Guard/Touching assist Assistive device: Walker-hemi Max distance: 62ft   Walk 10 feet activity   Assist     Assist level: Contact Guard/Touching assist Assistive device: Walker-hemi   Walk 50 feet activity   Assist    Assist level: Contact Guard/Touching assist Assistive device: Walker-hemi    Walk 150 feet activity   Assist Walk 150 feet activity did not occur: Safety/medical concerns (fatigue)         Walk 10 feet on uneven surface  activity   Assist Walk 10 feet on uneven surfaces activity did not occur: Safety/medical concerns (fatigue)         Wheelchair     Assist Is the patient using a wheelchair?: Yes Type of Wheelchair: Manual    Wheelchair assist level: Supervision/Verbal cueing Max wheelchair distance: 90    Wheelchair 50 feet with 2 turns activity    Assist        Assist Level: Supervision/Verbal cueing   Wheelchair 150 feet activity     Assist      Assist Level: Maximal Assistance - Patient 25 - 49%   Blood pressure 112/64, pulse 81, temperature 98.3 F (36.8 C), resp. rate 20, height 5\' 11"  (1.803 m), weight 87.9 kg, SpO2 96%.  Medical Problem List and Plan: 1. Functional deficits secondary to debility due to ITP             -patient may  shower             -ELOS/Goals: 7-10 days, Sup to Min a PT/OT/SLP             -Stable to continue CIR  -Left AFO was ordered  -will order chest x-ray due to increased dyspnea with exertion reported this afternoon- negative  -Expected discharge 4/8   2.  Antithrombotics: -DVT/anticoagulation:  Pharmaceutical: Xarelto              -antiplatelet therapy: ASA 3.Right foot neuropathic pain: scheduled for outpatient Qutenza. Tylenol prn. Continue gabapentin. Continue Norco PRN.   3-29: Pain well-controlled on current regimen  4. Mood/Behavior/Sleep: LCSW to follow for evaluation and support.              -antipsychotic agents: N/A 5. Neuropsych/cognition: This patient is capable of making decisions on his own behalf. 6. Skin/Wound Care: Routine pressure relief measures.  7. Fluids/Electrolytes/Nutrition: Strict I/O. Daily  CMET per hem/Onc 8.  Chronic ITP w/relapse: Has been received Decadron, IVIG and Nplate             --plans for rituxan on outpatient basis per Dr. Myna Hidalgo             --continue daily CMET/CBC X 4 days per input from Dr. Myna Hidalgo.    - Per Dr. Myna Hidalgo, expected platelet increase, will drop again as OP with Rituxin initiation  -4/1 platelets elevated 963, discussed with Dr. Myna Hidalgo.  No changes necessary, he will see tomorrow.  Appreciate his assistance  -4/2 Seen by Dr. Myna Hidalgo, appreciate assistance, continue to monitor CBC 2-3 times a week  -4/3 platelets little improved/decreased to 8 98K  9. Epistaxis: Nasal packing--augmentin X 3 d/c on 03/25. 10 .Prediabetes: Hgb A1C- 6.2. Elevated due to steriods but trending down. --Continue to monitor BS ac/hs and use SSI for elevated BS -4/3 glucose stable yesterday on CBG at 105  11. H/o CVA X 2 w/neurogenic bladder: On Xarelto and ASA.             --At baseline completes intermittent self cath several times a day.  12.Seizure d/o: Seizure free on Keppra 1500 mg bid 13. Anxiety/depression: On Buropion and Sertraline 14. CKD 3A: BUN/SCr 24/1.3--> Baseline SCr 1.3-->40/1.17. Avoid nephrotoxic medications. Repeat tomorrow since trending upward  -3-30: Continues to downtrend, BUN 25,, creatinine 1.38.  Essentially baseline.  -4/3 BUN and creatinine still overall stable but a little higher at 25/1.44, encourage oral fluids .  16. Leucocytosis: May be  steroid related. WBC reviewed and is stable, repeat tomorrow   - stable 14-16  17. Essential HTN. Monitor BP. Continue norvasc, add on magnesium level  -4/1 controlled continue to monitor  -4/3 BP controlled continue current regimen    07/17/2023    2:26 PM 07/17/2023    5:00 AM 07/17/2023    4:32 AM  Vitals with BMI  Weight  193 lbs 13 oz   BMI  27.04   Systolic 112  123  Diastolic 64  69  Pulse 81  68    18. Spasticity. Continue baclofen  19. Suboptimal vitamin D: D3 ordered, increase to 2,000U daily  20. Transaminitis: improved, prn tylenol added back    - AST normalized, ALT improving  21.  Cramping in right hand and foot--worse at nighttime. ?  RLS versus CTS and hand.  -Add nightly magnesium gluconate 250 mg; get mag levels in a.m.  -Discussed trial of neutral wrist brace as outpatient for at least 6 weeks 22.  Small rash on his neck.  Possibly from irritation from shoulder sling.  Does not appear like shingles to me today.  -Will start hydrocortisone cream  -4/3 improving continue current regimen  LOS: 8 days A FACE TO FACE EVALUATION WAS PERFORMED  Fanny Dance 07/17/2023, 2:42 PM

## 2023-07-17 NOTE — Progress Notes (Signed)
 Physical Therapy Session Note  Patient Details  Name: Mark [name] MRN: [id number] Date of Birth: [birth year]  Today's Date: 07/17/2023 PT Individual Time: 0810-0900, 1324-4010 PT Individual Time Calculation (min): 50 min, 41 min and Today's Date: 07/17/2023 PT Missed Time: 10 Minutes Missed Time Reason: Other (Comment) (pt eating)  Short Term Goals: Week 1:  PT Short Term Goal 1 (Week 1): pt will transfer sit<>stand with LRAD and CGA PT Short Term Goal 2 (Week 1): pt will transfer bed<>chair with LRAD and CGA PT Short Term Goal 3 (Week 1): pt will ambulate 80ft with LRAD and CGA  Skilled Therapeutic Interventions/Progress Updates:      Treatment session 1  Pt in deep sleep upon arrival. Pt awoken and requesting to eat his breakfast. Pt performed supine to sit with supervision. Pt seating EOB to eat his breakfast. Pt missed 10 min 2/2 eating breakfast.   Upon therapist return, pt agreeable to therapy. Pt denies any pain.   Primary PT requesting trial of RW versus hemi walker.   Pt incontinent of bowel, (unware of incontinence).  Pt ambulated bed to bathroom, bathroom to bed with min A with max verbal cues provided for safety for ambualtory transfer and assist to place/maintain L UE on RW. Pt continent of bowel on toilet. Pt performed sit to stand with CGA. Pt performed pericare with total A. Pt donned pants/diaper, and B shoes and L LE AFO with total A for time/energy conservation.   Pt ambulated with CGA with use of hemi walker bed<>door<>bed.   Therapist donned L UE splint on RW, pt ambulated bed<>door<>bed with CGA with assist to place L UE on RW, pt demos improved safety awareness with RW, with splint stabilzing L UE on RW.   Pt seated in WC at end of session with all needs within reach and bed alarm on.   Treatment Session 2   Pt seated in WC upon arrival. Pt agreeable to therapy. Pt denies any pain.   Continued to trail RW versus hemi walker to determine recommended AD for  home.   Pt ambulated 1x35, 1x60 feet with RW and L UE splint on RW with CGA/close supervision, pt requires max verbal cues for safety for RW with  navigating turns, and performing ambulatory transfers.  Pt ambulated 1x73 feet with hemi walker and close supervision, verbal cues provided for  positioning of hemi walker to side of pt versus in front of pt to prevent pt from stepping R LE between legs of hemi walker. Pt demos improved technique with minimal verbal cues.   Therapist overall recommends hemi walker versus RW with L UE splint at this time for discharge home 2/2 increased comfort/stability and safety awareness with hemi walker.   Pt daughter present at end of session. Discussed recommendation for home. Pt daughter agrees with this recommendation, and mentioned pt has tendency at home to leave AD and furniture walk.   Education provided to pt and daughter on importance of using hemi walker at all times at home for pt overall safety and to reduce fall risk. Pt verbalized understanding and agreeable.      Therapy Documentation Precautions:  Precautions Precautions: Fall Precaution/Restrictions Comments: Left hemiplegia CVA prior to admit Restrictions Weight Bearing Restrictions Per Provider Order: No  Therapy/Group: Individual Therapy  [facility] [place], [place], DPT  07/17/2023, 7:42 AM

## 2023-07-17 NOTE — Patient Care Conference (Signed)
 Inpatient RehabilitationTeam Conference and Plan of Care Update Date: 07/16/2023   Time: 11:37 AM    Patient Name: Mark [name]      Medical Record Number: [id number]  Date of Birth: 07/02/1945 Sex: Male         Room/Bed: 4U98J/1B14N-82 Payor Info: Payor: [employer] MEDICARE / Plan: [employer] MEDICARE HMO/PPO / Product Type: *No Product type* /    Admit Date/Time:  07/09/2023  1:59 PM  Primary Diagnosis:  Debility  Hospital Problems: Principal Problem:   Debility Active Problems:   Chronic ITP (idiopathic thrombocytopenia) [facility])    Expected Discharge Date: Expected Discharge Date: 07/22/23  Team Members Present: Physician leading conference: Dr. Fanny Dance Social Worker Present: Dossie Der, LCSW Nurse Present: Chana Bode, RN PT Present: Blima Rich, PT OT Present: Candee Furbish, OT PPS Coordinator present : Fae Pippin, SLP     Current Status/Progress Goal Weekly Team Focus  Bowel/Bladder   Patient is continent/incontinent of bowel/bladder. Q4-Q6hrs I & O cath   Patient will regain continence   Will assess Q shift and PRN    Swallow/Nutrition/ Hydration               ADL's   Supervision UB, min A LB, mod A toileting   Supervision   barriers- old dense L hemi UE, endurance deficit, dynamic standing balance    Mobility   bed mobility supervision, transfers with hemi walker CGA/min A, gait 71ft with hemi walker CGA, 4 6in steps with R handrail min A   supervision, mod I WC mobility  barriers: L hemiparesis, weakness/deconditioning, decreased balance/coordination. Plan for AFO consult on 4/2    Communication                Safety/Cognition/ Behavioral Observations               Pain   Patient denied pain   Patient will remain free of pain   Will assess Q Shift and PRN    Skin   Skin is intact   Skin will remain intact  Assess skin Q shift and PRN      Discharge Planning:  Home with wife who is not able to assist due to own health  issues. He is doing quite well. Awaiting recommendations   Team Discussion: Patient admitted with debility and limited by pain, poor endurance and dense left hemiparesis from previous stroke.  Patient on target to meet rehab goals: yes, currently needs supervision for upper body care and min assist for lower body care with mod assist  for toileting.  Able to ambulate using a hemi-walker with CGA and cues for safety up to 26' and manage (4) 6' steps with min assist.    *See Care Plan and progress notes for long and short-term goals.   Revisions to Treatment Plan:  AFO consult Dynamic standing balance training Oncology consult  CXR   Teaching Needs: Safety, medications, transfers, toileting, etc.   Current Barriers to Discharge: Decreased caregiver support  Possible Resolutions to Barriers: Family education OP follow up services Custom wheelchair     Medical Summary Current Status: debility, prediabetes, CKD,HTN, rash, thrombocytosis  Barriers to Discharge: Medical stability;Self-care education;Renal Insufficiency/Failure  Barriers to Discharge Comments: debility, prediabetes, CKD,HTN, rash, thrombocytosis Possible Resolutions to [employer] Focus: monitor Rash- steroid cream, seen by oncology today, monitor labs, monitor BP   Continued Need for Acute Rehabilitation Level of Care: The patient requires daily medical management by a physician with specialized training in physical medicine and rehabilitation  for the following reasons: Direction of a multidisciplinary physical rehabilitation program to maximize functional independence : Yes Medical management of patient stability for increased activity during participation in an intensive rehabilitation regime.: Yes Analysis of laboratory values and/or radiology reports with any subsequent need for medication adjustment and/or medical intervention. : Yes   I attest that I was present, lead the team conference, and concur with  the assessment and plan of the team.   Chana Bode B 07/17/2023, 2:33 PM

## 2023-07-17 NOTE — Progress Notes (Signed)
 Occupational Therapy Session Note  Patient Details  Name: Mark [name] MRN: [id number] Date of Birth: 08-21-1945  Today's Date: 07/17/2023 OT Individual Time: 4098-1191 OT Individual Time Calculation (min): 70 min    Short Term Goals: Week 2:  OT Short Term Goal 1 (Week 2): Pt will donn L shoe/AFO with min A OT Short Term Goal 2 (Week 2): Pt will perform tub bench transfer into bathtub with contact guard with cues  Skilled Therapeutic Interventions/Progress Updates:    Pt resting in bed upon arrival and agreeable to therapy. Pt declined bathing/dressing this morning, stating that he felt comfortable completing those tasks when he goes home. PT's dtr present at beginning of session and asked question about cushion for std chair at home, stating that pt had proclivity to slide out of his "easy chair" at home. Family ed scheduled for 4/4. OT intervention with focus on bed mobility, functional transfers, sit<>stand, standing balance, and ongoing discharge planning to prepare pt for increased independence with BADLs. Block practice sit<>supine simulated at therapy matt with approximate bed height. Pt completed 3 sets with supervision. Block practice sit<>stand 3x5 with supervision. Squats 3x8 with supervision. Standing activity with cornhole at supervision level. Reviewed home safety recommendations. Pt verbalized understanding.Pt returned to room and remained in w/c with all needs within reach. Belt alarm activated.   Therapy Documentation Precautions:  Precautions Precautions: Fall Precaution/Restrictions Comments: Left hemiplegia CVA prior to admit Restrictions Weight Bearing Restrictions Per Provider Order: No   Pain: Pt denies pain this morning   Therapy/Group: Individual Therapy  Rich Brave 07/17/2023, 12:05 PM

## 2023-07-17 NOTE — Plan of Care (Signed)
  Problem: RH BOWEL ELIMINATION Goal: RH STG MANAGE BOWEL WITH ASSISTANCE Description: STG Manage Bowel with  supervision Assistance. Outcome: Progressing   Problem: RH BLADDER ELIMINATION Goal: RH STG MANAGE BLADDER WITH ASSISTANCE Description: STG Manage Bladder With supervision Assistance Outcome: Progressing   Problem: RH SKIN INTEGRITY Goal: RH STG SKIN FREE OF INFECTION/BREAKDOWN Description: Manage skin free of infection/breakdown with supervision assistance Outcome: Progressing   Problem: RH SAFETY Goal: RH STG ADHERE TO SAFETY PRECAUTIONS W/ASSISTANCE/DEVICE Description: STG Adhere to Safety Precautions With  supervision Assistance/Device. Outcome: Progressing   Problem: RH PAIN MANAGEMENT Goal: RH STG PAIN MANAGED AT OR BELOW PT'S PAIN GOAL Description: <4 w/ prns Outcome: Progressing   Problem: RH KNOWLEDGE DEFICIT GENERAL Goal: RH STG INCREASE KNOWLEDGE OF SELF CARE AFTER HOSPITALIZATION Description: Manage increase knowledge  of self care after hospitalization with supervision assistance using educational materials provided Outcome: Progressing

## 2023-07-17 NOTE — Consult Note (Signed)
 Neuropsychological Consultation Comprehensive Inpatient Rehab   Patient:   Mark [name]   DOB:   1945/08/17  MR Number:  [id number]  Location:  [facility] [address] [facility] [address] [place] [address] Dept: [phone] Loc: [phone]           Date of Service:   07/16/2023  Start Time:   2 PM End Time:   3 PM  Provider/Observer:  Arley Phenix, Psy.D.       Clinical Neuropsychologist       Billing Code/Service: [phone]  Reason for Service:    Mark [name] is a 78 year old male referred for neuropsychological consultation during his ongoing admission to the comprehensive inpatient rehabilitation unit.  Patient has a significant past medical history including DVT, hypertension, CVA twice previously and mild left-sided weakness and sensory loss with chronic dysphagia resulting.  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  Patient was seen by his hematologist on 07/04/2023 and was found to had significant drop in platelets and admitted to [facility] that same day and started on Decadron, IVIG etc.  Patient has had sudden extreme rise in platelets which is typical for his pattern and hoping for it to stabilize.  Therapy evaluations were conducted and patient mated to CIR due to functional decline/debility.  During today's clinical visit the patient was oriented with adequate cognition.  Patient is hard of hearing.  Today we worked on coping and adjustment and review of mood state.  Patient is familiar with hospital procedures and reports that he is managing and coping fairly well with regard to the stressors associated with extended hospital stay.  Patient is motivated to make functional gains and is hoping to be able to improve his ambulation.  He knows he will need to  continue with therapeutic efforts postdischarge as well.  HPI for the current admission:    HPI: Mark [name] is a 78 year old male with history of DVT, HTN, CVA X 2 with mild left sided weakness and sensory loss and chronic dysphagia, CKD III- baseline SCr 1.3, seizure d/o, HOH, urinary retention requiring self caths, Lupus anticoagulant- on Xarelto, chronic ITP with recent admission for high dose decadron who was seen in office by Dr. Myna Hidalgo on 07/04/23 on follow up and reported intermittent epistaxis and found to have drop in platelets to 17. He was admitted to [facility] the same day and started on high dose decadron X 4days, IVIG X 2 days and nplate per input from Dr. Myna Hidalgo. Did require nasal packing for nose bleed which was covered with Augmentin and has resolved. Xarelto placed on hold briefly and resumed yesterday. Elevated BS managed with SSI. Platelets have improved to 302 and rise in WBC to 20 felt to be reactive in nature.  Retuxin considered outpatient.  Pt reports he had shingles pain R upper back and in his hand. This has improved now 1/10 intensity.    ST consulted for evaluation of swallow and recommended aspiration precautions and chin tuck with liquids due to hx of penetration/tendency to "get strangled". PT/OT consulted and patient was noted to be debilitated. He requires min to max assist with ADLS and noted to have significant balance deficits with mobility due to weakness. Marland Kitchen PTA was independent with use of HW and occasionally needed some assistance by his wife. CIR recommended due  to functional decline.   Medical History:   Past Medical History:  Diagnosis Date   Chronic ITP (idiopathic thrombocytopenia) (HCC)    Chronic ITP (idiopathic thrombocytopenia) (HCC)    Coronary artery disease 1992   MI   Difficulty swallowing    pt takes all meds wiht applesauce   DVT (deep venous thrombosis) (HCC)    Hard of hearing    hearing aids    Hep B w/o coma    Hypertension     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    Lupus anticoagulant disorder (HCC)    Lupus anticoagulant syndrome (HCC)    Multiple closed anterior-posterior compression fractures of pelvis (HCC)    Seizures (HCC)    last seizure 10 years ago approx    Sleep apnea    cpap broken x 1 year per wife    Stroke (HCC) 02/27/2011   Left side weakness         Patient Active Problem List   Diagnosis Date Noted   Chronic ITP (idiopathic thrombocytopenia) (HCC) 07/17/2023   Prediabetes 07/09/2023   Debility 07/09/2023   Acute ITP (HCC) 07/04/2023   Thrombocytopenia (HCC) 06/10/2023   Idiopathic thrombocytopenic purpura (ITP) (HCC) 06/09/2023   Acute lower UTI 07/04/2020   CKD (chronic kidney disease), stage III (HCC) 07/01/2020   Neurogenic bladder 07/01/2020   Pulmonary nodule 10/24/2016   Acute hypoxemic respiratory failure (HCC) 10/21/2016   Sepsis (HCC) 10/21/2016   DVT (deep venous thrombosis) (HCC) 10/12/2016   Supratherapeutic INR 10/12/2016   Acute deep vein thrombosis (DVT) of popliteal vein of right lower extremity (HCC) 10/12/2016   AKI (acute kidney injury) (HCC) 10/12/2016   Fever 10/12/2016   Leukocytosis 10/12/2016   Bacteriuria 10/12/2016   Deep vein thrombosis (DVT) (HCC) 03/02/2015   Lupus anticoagulant disorder (HCC) 03/02/2015   Ischemic stroke (HCC) 10/04/2014   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   Deep vein thrombosis (HCC) 10/04/2014   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   LA (lupus anticoagulant) disorder (HCC) 10/04/2014   Arteriosclerosis of coronary artery 09/10/2012   Apnea, sleep 09/10/2012   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   Dermatophytic onychia 01/29/2012   Spastic hemiplegia (HCC) 12/03/2011   Hemiparesis, left (HCC) 10/31/2011   Dysphonia 08/13/2011   Idiopathic thrombocytopenic purpura (HCC) 08/12/2011   Idiopathic thrombocytopenic purpura (HCC) 08/12/2011    Behavioral Observation/Mental Status:   Mark [name]  presents as a 78  y.o.-year-old Right handed Caucasian Male who appeared his stated age. his dress was Appropriate and he was Well Groomed and his manners were Appropriate to the situation.  his participation was indicative of Appropriate and Attentive behaviors.  There were physical disabilities noted.  he displayed an appropriate level of cooperation and motivation.    Interactions:    Active Appropriate  Attention:   within normal limits and attention span and concentration were age appropriate  Memory:   within normal limits; recent and remote memory intact  Visuo-spatial:   not examined  Speech (Volume):  normal  Speech:   normal; normal  Thought Process:  Coherent and Relevant  Linear and Logical  Though Content:  WNL; not suicidal and not homicidal  Orientation:   person, place, time/date, and situation  Judgment:   Good  Planning:   Good  Affect:    Appropriate  Mood:    Dysphoric  Insight:   Good  Intelligence:   high  Impression/DX:   Mark [name] is a 78 year old male referred for neuropsychological consultation  during his ongoing admission to the comprehensive inpatient rehabilitation unit.  Patient has a significant past medical history including DVT, hypertension, CVA twice previously and mild left-sided weakness and sensory loss with chronic dysphagia resulting.  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  Patient was seen by his hematologist on 07/04/2023 and was found to had significant drop in platelets and admitted to [facility] that same day and started on Decadron, IVIG etc.  Patient has had sudden extreme rise in platelets which is typical for his pattern and hoping for it to stabilize.  Therapy evaluations were conducted and patient mated to CIR due to functional decline/debility.  During today's clinical visit the patient  was oriented with adequate cognition.  Patient is hard of hearing.  Today we worked on coping and adjustment and review of mood state.  Patient is familiar with hospital procedures and reports that he is managing and coping fairly well with regard to the stressors associated with extended hospital stay.  Patient is motivated to make functional gains and is hoping to be able to improve his ambulation.  He knows he will need to continue with therapeutic efforts postdischarge as well.  Disposition/Plan:  Today we worked on coping and adjustment issues with extended hospital stay and significant medical/health issues.          Electronically Signed   _______________________ Arley Phenix, Psy.D. Clinical Neuropsychologist

## 2023-07-18 ENCOUNTER — Inpatient Hospital Stay: Attending: Hematology & Oncology

## 2023-07-18 DIAGNOSIS — M792 Neuralgia and neuritis, unspecified: Secondary | ICD-10-CM

## 2023-07-18 DIAGNOSIS — R21 Rash and other nonspecific skin eruption: Secondary | ICD-10-CM

## 2023-07-18 LAB — CBC WITH DIFFERENTIAL/PLATELET
Abs Immature Granulocytes: 0.1 10*3/uL — ABNORMAL HIGH (ref 0.00–0.07)
Basophils Absolute: 0.1 10*3/uL (ref 0.0–0.1)
Basophils Relative: 1 %
Eosinophils Absolute: 0.4 10*3/uL (ref 0.0–0.5)
Eosinophils Relative: 3 %
HCT: 33 % — ABNORMAL LOW (ref 39.0–52.0)
Hemoglobin: 10.5 g/dL — ABNORMAL LOW (ref 13.0–17.0)
Immature Granulocytes: 1 %
Lymphocytes Relative: 27 %
Lymphs Abs: 3 10*3/uL (ref 0.7–4.0)
MCH: 30.7 pg (ref 26.0–34.0)
MCHC: 31.8 g/dL (ref 30.0–36.0)
MCV: 96.5 fL (ref 80.0–100.0)
Monocytes Absolute: 2.4 10*3/uL — ABNORMAL HIGH (ref 0.1–1.0)
Monocytes Relative: 21 %
Neutro Abs: 5.4 10*3/uL (ref 1.7–7.7)
Neutrophils Relative %: 47 %
Platelets: 796 10*3/uL — ABNORMAL HIGH (ref 150–400)
RBC: 3.42 MIL/uL — ABNORMAL LOW (ref 4.22–5.81)
RDW: 18.8 % — ABNORMAL HIGH (ref 11.5–15.5)
WBC: 11.3 10*3/uL — ABNORMAL HIGH (ref 4.0–10.5)
nRBC: 0.7 % — ABNORMAL HIGH (ref 0.0–0.2)

## 2023-07-18 MED ORDER — GABAPENTIN 100 MG PO CAPS
100.0000 mg | ORAL_CAPSULE | Freq: Two times a day (BID) | ORAL | Status: DC
  Administered 2023-07-18 – 2023-07-19 (×2): 100 mg via ORAL
  Filled 2023-07-18 (×2): qty 1

## 2023-07-18 NOTE — Plan of Care (Signed)
  Problem: RH Balance Goal: LTG Patient will maintain dynamic standing with ADLs (OT) Description: LTG:  Patient will maintain dynamic standing balance with assist during activities of daily living (OT)  Flowsheets (Taken 07/18/2023 1553) LTG: Pt will maintain dynamic standing balance during ADLs with: (downgraded due to dynamic standing balance, dense LUE hemiplegia, and GM coordinaton deficits) Supervision/Verbal cueing   Problem: RH Bathing Goal: LTG Patient will bathe all body parts with assist levels (OT) Description: LTG: Patient will bathe all body parts with assist levels (OT) Flowsheets (Taken 07/18/2023 1553) LTG: Pt will perform bathing with assistance level/cueing: (downgraded due to dynamic standing balance, dense LUE hemiplegia, and GM coordinaton deficits) Minimal Assistance - Patient > 75%   Problem: RH Dressing Goal: LTG Patient will perform upper body dressing (OT) Description: LTG Patient will perform upper body dressing with assist, with/without cues (OT). Flowsheets (Taken 07/18/2023 1553) LTG: Pt will perform upper body dressing with assistance level of: (downgraded due to dynamic standing balance, dense LUE hemiplegia, and GM coordinaton deficits) Set up assist   Problem: RH Toileting Goal: LTG Patient will perform toileting task (3/3 steps) with assistance level (OT) Description: LTG: Patient will perform toileting task (3/3 steps) with assistance level (OT)  Flowsheets (Taken 07/18/2023 1553) LTG: Pt will perform toileting task (3/3 steps) with assistance level: (downgraded due to dynamic standing balance, dense LUE hemiplegia, and GM coordinaton deficits) Contact Guard/Touching assist

## 2023-07-18 NOTE — Progress Notes (Signed)
 Physical Therapy Session Note  Patient Details  Name: Mark [name] MRN: [id number] Date of Birth: 03/17/1946  Today's Date: 07/18/2023 PT Individual Time: 1134-1200 PT Individual Time Calculation (min): 26 min   Short Term Goals: Week 1:  PT Short Term Goal 1 (Week 1): pt will transfer sit<>stand with LRAD and CGA PT Short Term Goal 2 (Week 1): pt will transfer bed<>chair with LRAD and CGA PT Short Term Goal 3 (Week 1): pt will ambulate 77ft with LRAD and CGA   Skilled Therapeutic Interventions/Progress Updates:    Session focused on functional transfers with [facility] and NMR to address transitional movement and functional use of L hemibody. Pt performed stand step transfers with [facility] with CGA overall with CGA for balance. Extra time for transitions. Blocked practice sit <> stands after FTSS assessment for fall risk to focus on NMR for increased use of LLE during sit < > stands with focus on slowed decent as well as bias weightshifting to the L. Pt compensatory strategies of bias to the R during transition and decreased WB through the left. Then in seated position had patient lateral lean to the L for R trunk elongation with RUE overarching like a rainbow pattern for forced WB to the L and stretching on the R flank repeated x 5 reps x 2 sets with facilitation for movement pattern and placement of LUE for propped on elbow.   Five times Sit to Stand Test (FTSS) Method: Use a straight back chair with a solid seat that is 16-18" high. Ask participant to sit on the chair with arms folded across their chest.   Instructions: "Stand up and sit down as quickly as possible 5 times, keeping your arms folded across your chest."   Measurement: Stop timing when the participant stands the 5th time.  TIME: ___30___ (in seconds)  Times > 13.6 seconds is associated with increased disability and morbidity (Guralnik, 2000) Times > 15 seconds is predictive of recurrent falls in healthy individuals aged 15 and  older (Buatois, et al., 2008) Normal performance values in community dwelling individuals aged 22 and older (Bohannon, 2006): 60-69 years: 11.4 seconds 70-79 years: 12.6 seconds 80-89 years: 14.8 seconds  MCID: >= 2.3 seconds for Vestibular Disorders Wray Kearns, 2006)    Therapy Documentation Precautions:  Precautions Precautions: Fall Precaution/Restrictions Comments: Left hemiplegia CVA prior to admit Restrictions Weight Bearing Restrictions Per Provider Order: No  Pain: No reports of pain.   Therapy/Group: Individual Therapy  Karolee Stamps Darrol Poke, PT, DPT, CBIS  07/18/2023, 12:25 PM

## 2023-07-18 NOTE — Progress Notes (Signed)
 [name] looks quite good.  He is working hard.  I know that the staff up on [address] are doing a great job with him.  We are awaiting the CBC today.  His platelet count yesterday went down to 890,000.  We will have to see what today's level is.  Again, I am not surprised by this.  We will have to see how the platelet count trends downward and how quickly.  He is on anticoagulation because of the lupus anticoagulant.  He has had no bleeding.  Again he is working hard.  Hopefully sounds like he may go home on April 8.  His appetite is good.  He has had no nausea or vomiting.  He has had no change in bowel or bladder habits.  He still has the left-sided weakness/paralysis.  His vital signs are all stable.  Temperature 98.2.  Pulse 71.  Blood pressure 127/65.  His lungs sound clear bilaterally.  Cardiac exam regular rate and rhythm.  Abdomen soft.  Bowel sounds are present.  There is no palpable splenomegaly.  Extremities shows weakness on the left side.  Skin exam does not show any ecchymoses.  I do not see any petechia.  Neurological exam shows a left-sided weakness.  We will see what his CBC shows today.  Again, it sounds like he may go home on the eighth.  I will have to set him up as an outpatient with Rituxan.  I am sure that the platelet count will drop.  In a perfect world, I probably would consider him for a splenectomy.  However, I think he has other issues that would probably make a splenectomy difficult to tolerate.   Christin Bach, MD  Genesis 1:1

## 2023-07-18 NOTE — Progress Notes (Signed)
 PROGRESS NOTE   Subjective/Complaints:  Patient sitting in wheelchair in his room today.  Seen by oncology this morning, appreciate assistance.  Continues to have intermittent right hand pain.   ROS: Positives per HPI above. Denies fevers, chills, N/V, abdominal pain, chest pain, ,new weakness or paraesthesias.   + R Hand and R foot cramping --intermittent + Rash- improved + SOB with activity-continues to be improved  Objective:   No results found.  Recent Labs    07/17/23 0629 07/18/23 0614  WBC 10.5 11.3*  HGB 10.7* 10.5*  HCT 32.9* 33.0*  PLT 890* 796*   Recent Labs    07/17/23 0629  NA 138  K 3.6  CL 105  CO2 23  GLUCOSE 105*  BUN 25*  CREATININE 1.44*  CALCIUM 8.7*    Intake/Output Summary (Last 24 hours) at 07/18/2023 1821 Last data filed at 07/18/2023 1700 Gross per 24 hour  Intake 354 ml  Output 1825 ml  Net -1471 ml        Physical Exam: Vital Signs Blood pressure (!) 112/57, pulse 75, temperature 98.5 F (36.9 C), resp. rate 20, height 5\' 11"  (1.803 m), weight 87.9 kg, SpO2 97%. Gen: no distress, normal appearing.  Sitting up in wheelchair.  Working with physical therapy HEENT: oral mucosa pink and moist, NCAT Cardio: Reg rate and rhythm. Chest: normal effort, normal rate of breathing.  Clear to auscultation bilaterally.  On room air Abd: soft, non-distended.  Positive bowel sounds, normoactive. Ext: Trace bilateral lower extremity edema, nontender on palpation. Psych: pleasant, normal affect Skin: small rash on neck-a little better today  Neuro:     Mental Status: Alert and awake, long term memory deficits Speech/Languate: Follows commands CRANIAL NERVES: 2-12 grossly intact-other than hard of hearing Atrophy right hand intrinsic muscles Wearing AFO on left lower extremity   Prior exam MOTOR: RUE: 5/5 Deltoid, 5/5 Biceps, 5/5 Triceps,5/5 Grip LUE: 0/5 Deltoid, 0/5 Biceps, 1-2/5  Triceps, 1/5 Grip--isolated in sling RLE: HF 5/5, KE 5/5, ADF 5/5, APF 5/5 LLE: HF 4-/5, KE 4-/5, ADF 2-/5, APF 4-/5, stable 3/ 30   SENSORY: Altered to LT Lue and LLE   MSK: Increased tone LUE with hand in fist and increased elbow extensor tone Decreased dorsiflexion ROM LLE   Atrophy R hand intrinsic muscles --scar from prior surgery along palm--no sensory or motor deficits notable.  Negative Tinel's   Assessment/Plan: 1. Functional deficits which require 3+ hours per day of interdisciplinary therapy in a comprehensive inpatient rehab setting. Physiatrist is providing close team supervision and 24 hour management of active medical problems listed below. Physiatrist and rehab team continue to assess barriers to discharge/monitor patient progress toward functional and medical goals  Care Tool:  Bathing    Body parts bathed by patient: Chest, Abdomen, Front perineal area, Right upper leg, Left upper leg, Right lower leg, Left lower leg, Face, Left arm, Buttocks   Body parts bathed by helper: Right arm     Bathing assist Assist Level: Minimal Assistance - Patient > 75%     Upper Body Dressing/Undressing Upper body dressing   What is the patient wearing?: Pull over shirt    Upper body assist  Assist Level: Supervision/Verbal cueing    Lower Body Dressing/Undressing Lower body dressing      What is the patient wearing?: Underwear/pull up, Pants     Lower body assist Assist for lower body dressing: Minimal Assistance - Patient > 75%     Toileting Toileting    Toileting assist Assist for toileting: Minimal Assistance - Patient > 75%     Transfers Chair/bed transfer  Transfers assist     Chair/bed transfer assist level: Contact Guard/Touching assist     Locomotion Ambulation   Ambulation assist      Assist level: Contact Guard/Touching assist Assistive device: Walker-hemi Max distance: 49ft   Walk 10 feet activity   Assist     Assist level:  Contact Guard/Touching assist Assistive device: Walker-hemi   Walk 50 feet activity   Assist    Assist level: Contact Guard/Touching assist Assistive device: Walker-hemi    Walk 150 feet activity   Assist Walk 150 feet activity did not occur: Safety/medical concerns (fatigue)         Walk 10 feet on uneven surface  activity   Assist Walk 10 feet on uneven surfaces activity did not occur: Safety/medical concerns (fatigue)         Wheelchair     Assist Is the patient using a wheelchair?: Yes Type of Wheelchair: Manual    Wheelchair assist level: Supervision/Verbal cueing Max wheelchair distance: 90    Wheelchair 50 feet with 2 turns activity    Assist        Assist Level: Supervision/Verbal cueing   Wheelchair 150 feet activity     Assist      Assist Level: Maximal Assistance - Patient 25 - 49%   Blood pressure (!) 112/57, pulse 75, temperature 98.5 F (36.9 C), resp. rate 20, height 5\' 11"  (1.803 m), weight 87.9 kg, SpO2 97%.  Medical Problem List and Plan: 1. Functional deficits secondary to debility due to ITP             -patient may  shower             -ELOS/Goals: 7-10 days, Sup to Min a PT/OT/SLP             -Stable to continue CIR  -Left AFO was ordered  -will order chest x-ray due to increased dyspnea with exertion reported this afternoon- negative  -Expected discharge 4/8   2.  Antithrombotics: -DVT/anticoagulation:  Pharmaceutical: Xarelto             -antiplatelet therapy: ASA 3.Right foot neuropathic pain: scheduled for outpatient Qutenza. Tylenol prn. Continue gabapentin. Continue Norco PRN.   3-29: Pain well-controlled on current regimen  3-4 will increase gabapentin to 100 mg twice daily for neuropathic component of pain in his hand and foot  4. Mood/Behavior/Sleep: LCSW to follow for evaluation and support.              -antipsychotic agents: N/A 5. Neuropsych/cognition: This patient is capable of making decisions  on his own behalf. 6. Skin/Wound Care: Routine pressure relief measures.  7. Fluids/Electrolytes/Nutrition: Strict I/O. Daily CMET per hem/Onc 8.  Chronic ITP w/relapse: Has been received Decadron, IVIG and Nplate             --plans for rituxan on outpatient basis per Dr. Myna Hidalgo             --continue daily CMET/CBC X 4 days per input from Dr. Myna Hidalgo.    - Per Dr. Myna Hidalgo, expected platelet increase, will drop  again as OP with Rituxin initiation  -4/1 platelets elevated 963, discussed with Dr. Myna Hidalgo.  No changes necessary, he will see tomorrow.  Appreciate his assistance  -4/2 Seen by Dr. Myna Hidalgo, appreciate assistance, continue to monitor CBC 2-3 times a week  -4/3 platelets little improved/decreased to 8 98K  -4/4 improved to 796, seen by oncology again today.  Patient has been set up for outpatient Rituxan.  9. Epistaxis: Nasal packing--augmentin X 3 d/c on 03/25. 10 .Prediabetes: Hgb A1C- 6.2. Elevated due to steriods but trending down. --Continue to monitor BS ac/hs and use SSI for elevated BS -4/3 glucose stable yesterday on CBG at 105  11. H/o CVA X 2 w/neurogenic bladder: On Xarelto and ASA.             --At baseline completes intermittent self cath several times a day.  12.Seizure d/o: Seizure free on Keppra 1500 mg bid 13. Anxiety/depression: On Buropion and Sertraline 14. CKD 3A: BUN/SCr 24/1.3--> Baseline SCr 1.3-->40/1.17. Avoid nephrotoxic medications. Repeat tomorrow since trending upward  -3-30: Continues to downtrend, BUN 25,, creatinine 1.38.  Essentially baseline.  -4/3 BUN and creatinine still overall stable but a little higher at 25/1.44, encourage oral fluids  Recheck tomorrow .  16. Leucocytosis: May be steroid related. WBC reviewed and is stable, repeat tomorrow   - stable 14-16  17. Essential HTN. Monitor BP. Continue norvasc, add on magnesium level  -4/1 controlled continue to monitor  -4/4 BP controlled    07/18/2023    3:24 PM 07/18/2023    5:55 AM  07/18/2023    5:00 AM  Vitals with BMI  Weight   193 lbs 13 oz  BMI   27.04  Systolic 112 127   Diastolic 57 65   Pulse 75 71     18. Spasticity. Continue baclofen  19. Suboptimal vitamin D: D3 ordered, increase to 2,000U daily  20. Transaminitis: improved, prn tylenol added back    - AST normalized, ALT improving  21.  Cramping in right hand and foot--worse at nighttime. ?  RLS versus CTS and hand.  -Add nightly magnesium gluconate 250 mg; get mag levels in a.m.  -Discussed trial of neutral wrist brace as outpatient for at least 6 weeks  - Patient reports scar on his right hand is from past surgery after he had ischemia in his finger 22.  Small rash on his neck.  Possibly from irritation from shoulder sling.  Does not appear like shingles to me today.  -Will start hydrocortisone cream  -4/4 appears to be improving  LOS: 9 days A FACE TO FACE EVALUATION WAS PERFORMED  Fanny Dance 07/18/2023, 6:21 PM

## 2023-07-18 NOTE — Progress Notes (Signed)
 Occupational Therapy Session Note  Patient Details  Name: Mark [name] MRN: [id number] Date of Birth: 1945/07/08  Today's Date: 07/18/2023 OT Individual Time: 2956-2130 & 1305-1400 OT Individual Time Calculation (min): 40 min & 55 min   Short Term Goals: Week 2:  OT Short Term Goal 1 (Week 2): Pt will donn L shoe/AFO with min A OT Short Term Goal 2 (Week 2): Pt will perform tub bench transfer into bathtub with contact guard with cues  Skilled Therapeutic Interventions/Progress Updates:  .Session 1 Skilled OT intervention completed with focus on ADL retraining, functional transfers and DC planning. Pt received seated EOB, agreeable to session. No pain reported.  Pt reported he did self-cath this morning with nursing, however had questions about his difficulty with unplanned BM during cathing if doing in stance. Discussed that he may want to try having BM first seated (for infection prevention also) then do cathing in stance or can complete seated if he practices this method prior to DC, which would likely increase his safety anyways due to balance deficits.  Pt with LUE resting hand splint on but with improper positioning and noted to be wearing the replacement cover only, not the splint with foam structured piece. Discussed educating his family later on wear of this. Donned R shoe set up A, L shoe with AFO with mod A. CGA sit > stand using hemi walker and CGA ambulatory transfer > w/c. Seated at sink, pt removed dentures and placed in cup to soak in cleanser, then completed supervision oral care, UB bathing with min A for thoroughness of R under arm, supervision for deo application and set up A for UB dressing. Independent with combing hair.  LUE with severe spasticity this morning. OT completed gentle PROM to wrist/digits in prep for placement of wash cloth into palm, padded cushion under L elbow to reduce pressure point, and 1/2 lap trap applied for hemi positioning.  Pt remained seated in  w/c, with belt alarm on/activated, and with all needs in reach at end of session.  Session 2 Skilled OT intervention completed with focus on family education with wife and daughter present. Pt received seated in w/c, agreeable to session. No pain reported.  OT provided education on the following in prep for DC: -Recommended close supervision/CGA for all sit > stands, stand pivot and ambulatory transfers using hemi walker for all functional mobility and up to min A for BADLs due to dense LUE hemiplegia and frequency of LOB -Wife reports that pt has elevated toilet seat and grab bar on wall slightly behind him (but not directly beside) on the R side at home, with no prior difficulty with standing, however OT did inform family of pt's occurrence while in hospital to have increased difficulty when standing up from toilet without push up rail underneath him such as from [facility] frame or push up rail that could be applied to toilet base and especially from low surface  -Encouraged wear of AFO on LLE prior to gait for LLE stability and safety which challenges toilet transfers during the night. Suggested use of BSC at night time next to bed to eliminate having to donn/doff AFO in prep for gait vs transfer, and with lights on to increase arousal and minimize postural instability when pt is lethargic  -Discussed trial at timed toileting for BM to help manage incontinence and/or potential plan to complete cathing seated due to increased safety and independence with hemi technique  OT assisted pt in demonstrating the following during session: -  CGA sit > stand and ambulatory transfer with hemi walker > bathroom -CGA sit > stand from [facility] over toilet with use of RUE on hemi walker -Technique for donning/doffing AFO in easier manner  Family did not assist hands on and had additional questions regarding self care that could not be addressed during time limit of session therefore OT arranged additional family education  session on Monday in prep for DC.  Pt remained seated in w/c, with direct care handoff to PT at end of session.   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

## 2023-07-18 NOTE — Progress Notes (Signed)
 Physical Therapy Session Note  Patient Details  Name: Mark [name] MRN: [id number] Date of Birth: Mar 07, 1946  Today's Date: 07/18/2023 PT Individual Time: 1000-1043 + 1400-1500 PT Individual Time Calculation (min): 43 min  + 60 min  Short Term Goals: Week 1:  PT Short Term Goal 1 (Week 1): pt will transfer sit<>stand with LRAD and CGA PT Short Term Goal 2 (Week 1): pt will transfer bed<>chair with LRAD and CGA PT Short Term Goal 3 (Week 1): pt will ambulate 46ft with LRAD and CGA   Skilled Therapeutic Interventions/Progress Updates:      1st session: Pt sitting upright in w/c to start - agreeable to PT tx. He has no complaints of pain.  Wheeled to day room rehab gym for time management. Pt aware of family education session this PM with his Wife. Spent most of session discussing home setup, PT POC, PT DME rec's, fall prevention strategies, home safety, and problem solving entering and exiting the home.  Reviewed recommendation for [facility] rather than RW due to familiarity and comfort. Discussed recommendation for CGA for ambulation. Also discussing I/O cathing and functional progress with ADL's. Patient confirms that the ramp is installed but unsure of layout.  Completed stand step transfer with CGA and HW from w/c to mat table - pt requires cues for locking/unlocking brakes on his L side.   Timed up and go completed (TUG) using the HW and CGA. Completed in 1 minute and 30 seconds. Scores > 15 seconds indicate increased falls risk.   Pt instructed in w/c mobility from day room gym to his room, ~185ft. Completed at supervision level using BLE and RUE - again, needing cues for awareness to L side and to unlock L brake. L foot sometimes gets caught if too much knee flexion during foot propulsion - educated on hemi technique with L foot supported with leg rest for safety concern.   Pt ended treatment sitting upright in w/c with 1/2 lap tray supporting RUE, call bell in lap, and safety belt alarm  on.  2nd session: Direct handoff of care from OT to start session. Pt in agreement to therapy session. Focused session on family education with his wife and his daughter.   Reviewed PT goals, PT POC, PT DME rec's, home safety, AFO fitting, follow up therapy rec's, etc. Family confirmed that ramp is built and ready for use - showed pictures of ramp and it appears ADA accessible with gradual slope and railings on both sides. They also confirmed having a small ramp to manage the threshold at the door.   Transported patient down to ortho rehab gym for time management. Completed car transfer with car height simulating their Toyota Prius - pt completed with CGA and use of HW via stand pivot transfer. Able to manage BLE in/out although difficulty getting LLE in due to extensor tone.   Practiced ambulating up/down ramp with HW at CGA level. Reviewed guarding, hand positioning, and verbal cueing for patient on [facility] management. Pt practiced using the hand rails instead of the HW but patient leaning against railing with his body with more of a falls risk. Recommended to family to first use wheelchair when they arrive home.   Stair training completed using 6" steps and 1 hand rail. Pt able to navigate up/down x8 steps with CGA for ascent and light minA for descent. Completes with step-to pattern while forward facing. Due to extensor tone on his LLE, has difficulty with descent and bending his knee.   Returned  to his room and patient asking to use the toilet. Incontinent of loose BM. Completed stand pivot transfer to toilet using grab bars with minA. MaxA for lower body dressing and for dirty brief. TotalA for posterior pericare. Direct handoff of care to NT due to time constraints.       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

## 2023-07-19 DIAGNOSIS — E876 Hypokalemia: Secondary | ICD-10-CM

## 2023-07-19 LAB — BASIC METABOLIC PANEL WITH GFR
Anion gap: 7 (ref 5–15)
BUN: 26 mg/dL — ABNORMAL HIGH (ref 8–23)
CO2: 21 mmol/L — ABNORMAL LOW (ref 22–32)
Calcium: 8.9 mg/dL (ref 8.9–10.3)
Chloride: 112 mmol/L — ABNORMAL HIGH (ref 98–111)
Creatinine, Ser: 1.39 mg/dL — ABNORMAL HIGH (ref 0.61–1.24)
GFR, Estimated: 52 mL/min — ABNORMAL LOW (ref >60)
Glucose, Bld: 91 mg/dL (ref 70–99)
Potassium: 3.3 mmol/L — ABNORMAL LOW (ref 3.5–5.1)
Sodium: 140 mmol/L (ref 135–145)

## 2023-07-19 LAB — CBC
HCT: 31.9 % — ABNORMAL LOW (ref 39.0–52.0)
Hemoglobin: 10.1 g/dL — ABNORMAL LOW (ref 13.0–17.0)
MCH: 29.6 pg (ref 26.0–34.0)
MCHC: 31.7 g/dL (ref 30.0–36.0)
MCV: 93.5 fL (ref 80.0–100.0)
Platelets: 795 10*3/uL — ABNORMAL HIGH (ref 150–400)
RBC: 3.41 MIL/uL — ABNORMAL LOW (ref 4.22–5.81)
RDW: 18.3 % — ABNORMAL HIGH (ref 11.5–15.5)
WBC: 10.5 10*3/uL (ref 4.0–10.5)
nRBC: 0.7 % — ABNORMAL HIGH (ref 0.0–0.2)

## 2023-07-19 MED ORDER — POTASSIUM CHLORIDE CRYS ER 10 MEQ PO TBCR
30.0000 meq | EXTENDED_RELEASE_TABLET | Freq: Once | ORAL | Status: AC
  Administered 2023-07-19: 30 meq via ORAL
  Filled 2023-07-19: qty 3

## 2023-07-19 MED ORDER — GABAPENTIN 100 MG PO CAPS
100.0000 mg | ORAL_CAPSULE | Freq: Three times a day (TID) | ORAL | Status: DC
  Administered 2023-07-19 – 2023-07-22 (×8): 100 mg via ORAL
  Filled 2023-07-19 (×8): qty 1

## 2023-07-19 NOTE — Plan of Care (Signed)
  Problem: Consults Goal: RH GENERAL PATIENT EDUCATION Description: See Patient Education module for education specifics. Outcome: Progressing   Problem: RH BOWEL ELIMINATION Goal: RH STG MANAGE BOWEL WITH ASSISTANCE Description: STG Manage Bowel with  supervision Assistance. Outcome: Progressing   Problem: RH BLADDER ELIMINATION Goal: RH STG MANAGE BLADDER WITH ASSISTANCE Description: STG Manage Bladder With supervision Assistance Outcome: Progressing   Problem: RH SKIN INTEGRITY Goal: RH STG SKIN FREE OF INFECTION/BREAKDOWN Description: Manage skin free of infection/breakdown with supervision assistance Outcome: Progressing   Problem: RH SAFETY Goal: RH STG ADHERE TO SAFETY PRECAUTIONS W/ASSISTANCE/DEVICE Description: STG Adhere to Safety Precautions With  supervision Assistance/Device. Outcome: Progressing   Problem: RH PAIN MANAGEMENT Goal: RH STG PAIN MANAGED AT OR BELOW PT'S PAIN GOAL Description: <4 w/ prns Outcome: Progressing   Problem: RH KNOWLEDGE DEFICIT GENERAL Goal: RH STG INCREASE KNOWLEDGE OF SELF CARE AFTER HOSPITALIZATION Description: Manage increase knowledge  of self care after hospitalization with supervision assistance using educational materials provided Outcome: Progressing

## 2023-07-19 NOTE — Progress Notes (Signed)
 Mark [name] is doing well.  He continues to progress through his rehab.  Hopefully, he will be able to go home next week.  His platelet count is trending downward slowly.  Yesterday, his white count was 11.3.  Hemoglobin 10.5.  Platelet count 800,000.  Again, I am not surprised that the platelets are trending downward.  He has had no bleeding.  He continues on his Xarelto and baby aspirin.  He is eating well.  Has had no problems with nausea or vomiting.  He is going to the bathroom.  He says he is catheterizing himself.  He has had no fever.  All of his vital signs are stable.  Temperature is 98.  Pulse 72.  Blood pressure 131/75.  Overall, there really is no change in his physical exam.  We will see what his platelet count is on Monday.  Again sounds like he may go home on Tuesday.   Mark Bach, MD  [place]brew[place] 12:12

## 2023-07-19 NOTE — Progress Notes (Addendum)
 PROGRESS NOTE   Subjective/Complaints:  No new concerns this morning.  Reports his pain is doing better  ROS: Positives per HPI above. Denies fevers, chills, N/V, abdominal pain, chest pain, ,new weakness or paraesthesias.   + R Hand and R foot cramping --intermittent-today he cannot tell me if it is worse at night or during the day + Rash-improved not itching + SOB with activity-continues to be improved  Objective:   No results found.  Recent Labs    07/18/23 0614 07/19/23 0633  WBC 11.3* 10.5  HGB 10.5* 10.1*  HCT 33.0* 31.9*  PLT 796* 795*   Recent Labs    07/17/23 0629 07/19/23 0633  NA 138 140  K 3.6 3.3*  CL 105 112*  CO2 23 21*  GLUCOSE 105* 91  BUN 25* 26*  CREATININE 1.44* 1.39*  CALCIUM 8.7* 8.9    Intake/Output Summary (Last 24 hours) at 07/19/2023 1220 Last data filed at 07/19/2023 0758 Gross per 24 hour  Intake 836 ml  Output 1900 ml  Net -1064 ml        Physical Exam: Vital Signs Blood pressure 131/75, pulse 72, temperature 98 F (36.7 C), temperature source Oral, resp. rate 18, height 5\' 11"  (1.803 m), weight 87.7 kg, SpO2 99%. Gen: no distress, normal appearing.  Sitting up in wheelchair.   HEENT: oral mucosa pink and moist, NCAT Cardio: Reg rate and rhythm. Chest: normal effort, normal rate of breathing.  Clear to auscultation bilaterally.  On room air Abd: soft, non-distended.  Positive bowel sounds, normoactive. Ext: Trace bilateral lower extremity edema, nontender on palpation. Psych: pleasant, normal affect Skin: small rash on neck-continues to improve  Neuro:     Mental Status: Alert and awake, long term memory deficits Speech/Languate: Follows commands CRANIAL NERVES: 2-12 grossly intact-other than hard of hearing Atrophy right hand intrinsic muscles Wearing AFO on left lower extremity   Prior exam MOTOR: RUE: 5/5 Deltoid, 5/5 Biceps, 5/5 Triceps,5/5 Grip LUE: 0/5  Deltoid, 0/5 Biceps, 1-2/5 Triceps, 1/5 Grip--isolated in sling RLE: HF 5/5, KE 5/5, ADF 5/5, APF 5/5 LLE: HF 4-/5, KE 4-/5, ADF 2-/5, APF 4-/5, stable 3/ 30   SENSORY: Altered to LT Lue and LLE   MSK: Increased tone LUE with hand in fist and increased elbow extensor tone Decreased dorsiflexion ROM LLE   Atrophy R hand intrinsic muscles --scar from prior surgery along palm--no sensory or motor deficits notable.  Negative Tinel's   Assessment/Plan: 1. Functional deficits which require 3+ hours per day of interdisciplinary therapy in a comprehensive inpatient rehab setting. Physiatrist is providing close team supervision and 24 hour management of active medical problems listed below. Physiatrist and rehab team continue to assess barriers to discharge/monitor patient progress toward functional and medical goals  Care Tool:  Bathing    Body parts bathed by patient: Chest, Abdomen, Front perineal area, Right upper leg, Left upper leg, Right lower leg, Left lower leg, Face, Left arm, Buttocks   Body parts bathed by helper: Right arm     Bathing assist Assist Level: Minimal Assistance - Patient > 75%     Upper Body Dressing/Undressing Upper body dressing   What is the patient  wearing?: Pull over shirt    Upper body assist Assist Level: Supervision/Verbal cueing    Lower Body Dressing/Undressing Lower body dressing      What is the patient wearing?: Underwear/pull up, Pants     Lower body assist Assist for lower body dressing: Minimal Assistance - Patient > 75%     Toileting Toileting    Toileting assist Assist for toileting: Minimal Assistance - Patient > 75%     Transfers Chair/bed transfer  Transfers assist     Chair/bed transfer assist level: Contact Guard/Touching assist     Locomotion Ambulation   Ambulation assist      Assist level: Contact Guard/Touching assist Assistive device: Walker-hemi Max distance: 29ft   Walk 10 feet  activity   Assist     Assist level: Contact Guard/Touching assist Assistive device: Walker-hemi   Walk 50 feet activity   Assist    Assist level: Contact Guard/Touching assist Assistive device: Walker-hemi    Walk 150 feet activity   Assist Walk 150 feet activity did not occur: Safety/medical concerns (fatigue)         Walk 10 feet on uneven surface  activity   Assist Walk 10 feet on uneven surfaces activity did not occur: Safety/medical concerns (fatigue)         Wheelchair     Assist Is the patient using a wheelchair?: Yes Type of Wheelchair: Manual    Wheelchair assist level: Supervision/Verbal cueing Max wheelchair distance: 90    Wheelchair 50 feet with 2 turns activity    Assist        Assist Level: Supervision/Verbal cueing   Wheelchair 150 feet activity     Assist      Assist Level: Maximal Assistance - Patient 25 - 49%   Blood pressure 131/75, pulse 72, temperature 98 F (36.7 C), temperature source Oral, resp. rate 18, height 5\' 11"  (1.803 m), weight 87.7 kg, SpO2 99%.  Medical Problem List and Plan: 1. Functional deficits secondary to debility due to ITP             -patient may  shower             -ELOS/Goals: 7-10 days, Sup to Min a PT/OT/SLP             -Stable to continue CIR  -Left AFO was ordered  -will order chest x-ray due to increased dyspnea with exertion reported this afternoon- negative  -Expected discharge 4/8   2.  Antithrombotics: -DVT/anticoagulation:  Pharmaceutical: Xarelto             -antiplatelet therapy: ASA 3.Right foot neuropathic pain: scheduled for outpatient Qutenza. Tylenol prn. Continue gabapentin. Continue Norco PRN.   3-29: Pain well-controlled on current regimen  3-4 will increase gabapentin to 100 mg twice daily for neuropathic component of pain in his hand and foot  3/5 increase gabapentin to TID  4. Mood/Behavior/Sleep: LCSW to follow for evaluation and support.               -antipsychotic agents: N/A 5. Neuropsych/cognition: This patient is capable of making decisions on his own behalf. 6. Skin/Wound Care: Routine pressure relief measures.  7. Fluids/Electrolytes/Nutrition: Strict I/O. Daily CMET per hem/Onc 8.  Chronic ITP w/relapse: Has been received Decadron, IVIG and Nplate             --plans for rituxan on outpatient basis per Dr. Myna Hidalgo             --continue daily CMET/CBC X 4  days per input from Dr. Myna Hidalgo.    - Per Dr. Myna Hidalgo, expected platelet increase, will drop again as OP with Rituxin initiation  -4/1 platelets elevated 963, discussed with Dr. Myna Hidalgo.  No changes necessary, he will see tomorrow.  Appreciate his assistance  -4/2 Seen by Dr. Myna Hidalgo, appreciate assistance, continue to monitor CBC 2-3 times a week  -4/3 platelets little improved/decreased to 8 98K  -4/4 improved to 796, seen by oncology again today.  Patient has been set up for outpatient Rituxan.  4/5 platelets 795 today oncology following, appreciate assistance.  Recheck Monday  9. Epistaxis: Nasal packing--augmentin X 3 d/c on 03/25. 10 .Prediabetes: Hgb A1C- 6.2. Elevated due to steriods but trending down. --Continue to monitor BS ac/hs and use SSI for elevated BS -4/3 glucose stable yesterday on CBG at 105  11. H/o CVA X 2 w/neurogenic bladder: On Xarelto and ASA.             --At baseline completes intermittent self cath several times a day.  12.Seizure d/o: Seizure free on Keppra 1500 mg bid 13. Anxiety/depression: On Buropion and Sertraline 14. CKD 3A: BUN/SCr 24/1.3--> Baseline SCr 1.3-->40/1.17. Avoid nephrotoxic medications. Repeat tomorrow since trending upward  -3-30: Continues to downtrend, BUN 25,, creatinine 1.38.  Essentially baseline.  -4/4 BUN and creatinine still overall stable but a little higher at 25/1.44, encourage oral fluids  4/5 BUN/creatinine 26/1.39, stable continue encourage oral fluids .  16. Leucocytosis: May be steroid related. WBC reviewed and  is stable, repeat tomorrow   - WBC 10.5 today  17. Essential HTN. Monitor BP. Continue norvasc, add on magnesium level  -4/1 controlled continue to monitor  -4/5 BP controlled, continue to monitor    07/19/2023    3:54 AM 07/18/2023    8:33 PM 07/18/2023    7:45 PM  Vitals with BMI  Weight 193 lbs 5 oz    BMI 26.98    Systolic 131 107 161  Diastolic 75 61 61  Pulse 72  71    18. Spasticity. Continue baclofen  19. Suboptimal vitamin D: D3 ordered, increase to 2,000U daily  20. Transaminitis: improved, prn tylenol added back    - AST normalized, ALT improving  21.  Cramping in right hand and foot--worse at nighttime. ?  RLS versus CTS and hand.  -Add nightly magnesium gluconate 250 mg; get mag levels in a.m.  -Discussed trial of neutral wrist brace as outpatient for at least 6 weeks  - Patient reports scar on his right hand is from past surgery after he had ischemia in his finger 22.  Small rash on his neck.  Possibly from irritation from shoulder sling.  Does not appear like shingles to me today.  -Will start hydrocortisone cream  -4/5 continues to improve  23. Hypokalemia  - x1 4/5  LOS: 10 days A FACE TO FACE EVALUATION WAS PERFORMED  Fanny Dance 07/19/2023, 12:20 PM

## 2023-07-20 DIAGNOSIS — R197 Diarrhea, unspecified: Secondary | ICD-10-CM

## 2023-07-20 NOTE — Plan of Care (Signed)
  Problem: Consults Goal: RH GENERAL PATIENT EDUCATION Description: See Patient Education module for education specifics. Outcome: Progressing   Problem: RH BOWEL ELIMINATION Goal: RH STG MANAGE BOWEL WITH ASSISTANCE Description: STG Manage Bowel with  supervision Assistance. Outcome: Progressing   Problem: RH SKIN INTEGRITY Goal: RH STG SKIN FREE OF INFECTION/BREAKDOWN Description: Manage skin free of infection/breakdown with supervision assistance Outcome: Progressing   Problem: RH SAFETY Goal: RH STG ADHERE TO SAFETY PRECAUTIONS W/ASSISTANCE/DEVICE Description: STG Adhere to Safety Precautions With  supervision Assistance/Device. Outcome: Progressing   Problem: RH PAIN MANAGEMENT Goal: RH STG PAIN MANAGED AT OR BELOW PT'S PAIN GOAL Description: <4 w/ prns Outcome: Progressing

## 2023-07-20 NOTE — Progress Notes (Signed)
 PROGRESS NOTE   Subjective/Complaints:  Reports last few Bms have been a little loose. Hand pain is improved.   ROS: Positives per HPI above. Denies fevers, chills, N/V, abdominal pain, chest pain, ,new weakness or paraesthesias + R Hand and R foot cramping --improved + Rash-improved not itching + SOB with activity-continues to be improved + loose BMs Objective:   No results found.  Recent Labs    07/18/23 0614 07/19/23 0633  WBC 11.3* 10.5  HGB 10.5* 10.1*  HCT 33.0* 31.9*  PLT 796* 795*   Recent Labs    07/19/23 0633  NA 140  K 3.3*  CL 112*  CO2 21*  GLUCOSE 91  BUN 26*  CREATININE 1.39*  CALCIUM 8.9    Intake/Output Summary (Last 24 hours) at 07/20/2023 1035 Last data filed at 07/20/2023 0714 Gross per 24 hour  Intake 1468 ml  Output 2400 ml  Net -932 ml        Physical Exam: Vital Signs Blood pressure (!) 147/80, pulse 75, temperature 98.3 F (36.8 C), resp. rate 18, height 5\' 11"  (1.803 m), weight 87.7 kg, SpO2 100%. Gen: no distress, normal appearing.  Sitting up in wheelchair.   HEENT: oral mucosa pink and moist, NCAT Cardio: Reg rate and rhythm. Chest: normal effort, normal rate of breathing.  Clear to auscultation bilaterally.  On room air Abd: soft, non-distended.  Positive bowel sounds, normoactive. 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  Negative Tinel's   Assessment/Plan: 1. Functional deficits which require 3+ hours per day of interdisciplinary therapy in a comprehensive inpatient rehab setting. Physiatrist is providing close team supervision and 24 hour management of active medical problems listed below. Physiatrist and rehab team continue to assess barriers to discharge/monitor patient progress toward functional and medical goals  Care Tool:  Bathing    Body parts bathed by patient: Chest, Abdomen, Front perineal area, Right upper leg, Left upper leg, Right lower leg, Left lower leg, Face, Left arm, Buttocks   Body parts bathed by helper: Right arm     Bathing assist Assist Level: Minimal Assistance - Patient > 75%     Upper Body Dressing/Undressing Upper body dressing   What is the patient wearing?: Pull over shirt    Upper body assist Assist Level: Supervision/Verbal cueing    Lower Body Dressing/Undressing Lower body dressing  What is the patient wearing?: Underwear/pull up, Pants     Lower body assist Assist for lower body dressing: Minimal Assistance - Patient > 75%     Toileting Toileting    Toileting assist Assist for toileting: Minimal Assistance - Patient > 75%     Transfers Chair/bed transfer  Transfers assist     Chair/bed transfer assist level: Contact Guard/Touching assist     Locomotion Ambulation   Ambulation assist      Assist level: Contact Guard/Touching assist Assistive device: Walker-hemi Max distance: 58ft   Walk 10 feet activity   Assist     Assist level: Contact Guard/Touching assist Assistive device: Walker-hemi   Walk 50 feet activity   Assist     Assist level: Contact Guard/Touching assist Assistive device: Walker-hemi    Walk 150 feet activity   Assist Walk 150 feet activity did not occur: Safety/medical concerns (fatigue)         Walk 10 feet on uneven surface  activity   Assist Walk 10 feet on uneven surfaces activity did not occur: Safety/medical concerns (fatigue)         Wheelchair     Assist Is the patient using a wheelchair?: Yes Type of Wheelchair: Manual    Wheelchair assist level: Supervision/Verbal cueing Max wheelchair distance: 90    Wheelchair 50 feet with 2 turns activity    Assist        Assist Level: Supervision/Verbal cueing   Wheelchair 150 feet activity     Assist      Assist Level: Maximal Assistance - Patient 25 - 49%   Blood pressure (!) 147/80, pulse 75, temperature 98.3 F (36.8 C), resp. rate 18, height 5\' 11"  (1.803 m), weight 87.7 kg, SpO2 100%.  Medical Problem List and Plan: 1. Functional deficits secondary to debility due to ITP             -patient may  shower             -ELOS/Goals: 7-10 days, Sup to Min a PT/OT/SLP             -Stable to continue CIR  -Left AFO was ordered  -will order chest x-ray due to increased dyspnea with exertion reported this afternoon- negative  -Expected discharge 4/8   2.  Antithrombotics: -DVT/anticoagulation:  Pharmaceutical: Xarelto             -antiplatelet therapy: ASA 3.Right foot neuropathic pain: scheduled for outpatient Qutenza. Tylenol prn. Continue gabapentin. Continue Norco PRN.   3-29: Pain well-controlled on current regimen  3-4 will increase gabapentin to 100 mg twice daily for neuropathic component of pain in his hand and foot  3/5 increase gabapentin to TID  3/6 hand pain improved, continue current regimen  4. Mood/Behavior/Sleep: LCSW to follow for evaluation and support.              -antipsychotic agents: N/A 5. Neuropsych/cognition: This patient is capable of making decisions on his own  behalf. 6. Skin/Wound Care: Routine pressure relief measures.  7. Fluids/Electrolytes/Nutrition: Strict I/O. Daily CMET per hem/Onc 8.  Chronic ITP w/relapse: Has been received Decadron, IVIG and Nplate             --plans for rituxan on outpatient basis per Dr. Myna Hidalgo             --continue daily CMET/CBC X 4 days per input from Dr. Myna Hidalgo.    - Per Dr. Myna Hidalgo, expected platelet increase, will drop again as OP with  Rituxin initiation  -4/1 platelets elevated 963, discussed with Dr. Myna Hidalgo.  No changes necessary, he will see tomorrow.  Appreciate his assistance  -4/2 Seen by Dr. Myna Hidalgo, appreciate assistance, continue to monitor CBC 2-3 times a week  -4/3 platelets little improved/decreased to 8 98K  -4/4 improved to 796, seen by oncology again today.  Patient has been set up for outpatient Rituxan.  4/5 platelets 795 today oncology following, appreciate assistance.   Recheck labs tomorrow  9. Epistaxis: Nasal packing--augmentin X 3 d/c on 03/25. 10 .Prediabetes: Hgb A1C- 6.2. Elevated due to steriods but trending down. --Continue to monitor BS ac/hs and use SSI for elevated BS -4/3 glucose stable yesterday on CBG at 105  11. H/o CVA X 2 w/neurogenic bladder: On Xarelto and ASA.             --At baseline completes intermittent self cath several times a day.  12.Seizure d/o: Seizure free on Keppra 1500 mg bid 13. Anxiety/depression: On Buropion and Sertraline 14. CKD 3A: BUN/SCr 24/1.3--> Baseline SCr 1.3-->40/1.17. Avoid nephrotoxic medications. Repeat tomorrow since trending upward  -3-30: Continues to downtrend, BUN 25,, creatinine 1.38.  Essentially baseline.  -4/4 BUN and creatinine still overall stable but a little higher at 25/1.44, encourage oral fluids  4/5 BUN/creatinine 26/1.39, stable continue encourage oral fluids  Recheck tomorrow  .  16. Leucocytosis: May be steroid related. WBC reviewed and is stable, repeat tomorrow   - WBC 10.5 4/5, recheck tomorrow   17.  Essential HTN. Monitor BP. Continue norvasc, add on magnesium level  -4/1 controlled continue to monitor  -4/6 controlled, continue to monitor     07/20/2023    4:38 AM 07/19/2023    8:20 PM 07/19/2023    7:00 PM  Vitals with BMI  Weight 193 lbs 5 oz    BMI 26.98    Systolic 147 135 811  Diastolic 80 70 70  Pulse 75  71    18. Spasticity. Continue baclofen  19. Suboptimal vitamin D: D3 ordered, increase to 2,000U daily  20. Transaminitis: improved, prn tylenol added back    - AST normalized, ALT improving  21.  Cramping in right hand and foot--worse at nighttime. ?  RLS versus CTS and hand.  -Add nightly magnesium gluconate 250 mg; get mag levels in a.m.  -Discussed trial of neutral wrist brace as outpatient for at least 6 weeks  - Patient reports scar on his right hand is from past surgery after he had ischemia in his finger 22.  Small rash on his neck.  Possibly from irritation from shoulder sling.  Does not appear like shingles to me today.  -Will start hydrocortisone cream  -4/6 improved  23. Hypokalemia  - x1 4/5  Recheck tomorrow   24. Diarrhea-mild  -4/6 Stop magnesium supplement  - Pts wife thinks him eating frequent pinto beans contibuted   LOS: 11 days A FACE TO FACE EVALUATION WAS PERFORMED  Fanny Dance 07/20/2023, 10:35 AM

## 2023-07-21 ENCOUNTER — Other Ambulatory Visit (HOSPITAL_COMMUNITY): Payer: Self-pay

## 2023-07-21 ENCOUNTER — Encounter: Payer: Self-pay | Admitting: Hematology & Oncology

## 2023-07-21 LAB — BASIC METABOLIC PANEL WITH GFR
Anion gap: 7 (ref 5–15)
BUN: 20 mg/dL (ref 8–23)
CO2: 20 mmol/L — ABNORMAL LOW (ref 22–32)
Calcium: 8.9 mg/dL (ref 8.9–10.3)
Chloride: 112 mmol/L — ABNORMAL HIGH (ref 98–111)
Creatinine, Ser: 1.31 mg/dL — ABNORMAL HIGH (ref 0.61–1.24)
GFR, Estimated: 56 mL/min — ABNORMAL LOW (ref >60)
Glucose, Bld: 98 mg/dL (ref 70–99)
Potassium: 3.6 mmol/L (ref 3.5–5.1)
Sodium: 139 mmol/L (ref 135–145)

## 2023-07-21 LAB — CBC WITH DIFFERENTIAL/PLATELET
Abs Immature Granulocytes: 0.07 10*3/uL (ref 0.00–0.07)
Basophils Absolute: 0.1 10*3/uL (ref 0.0–0.1)
Basophils Relative: 1 %
Eosinophils Absolute: 0.4 10*3/uL (ref 0.0–0.5)
Eosinophils Relative: 4 %
HCT: 32.4 % — ABNORMAL LOW (ref 39.0–52.0)
Hemoglobin: 10.4 g/dL — ABNORMAL LOW (ref 13.0–17.0)
Immature Granulocytes: 1 %
Lymphocytes Relative: 25 %
Lymphs Abs: 2.5 10*3/uL (ref 0.7–4.0)
MCH: 30.3 pg (ref 26.0–34.0)
MCHC: 32.1 g/dL (ref 30.0–36.0)
MCV: 94.5 fL (ref 80.0–100.0)
Monocytes Absolute: 1.8 10*3/uL — ABNORMAL HIGH (ref 0.1–1.0)
Monocytes Relative: 17 %
Neutro Abs: 5.4 10*3/uL (ref 1.7–7.7)
Neutrophils Relative %: 52 %
Platelets: 619 10*3/uL — ABNORMAL HIGH (ref 150–400)
RBC: 3.43 MIL/uL — ABNORMAL LOW (ref 4.22–5.81)
RDW: 18.3 % — ABNORMAL HIGH (ref 11.5–15.5)
WBC: 10.3 10*3/uL (ref 4.0–10.5)
nRBC: 0.4 % — ABNORMAL HIGH (ref 0.0–0.2)

## 2023-07-21 MED ORDER — RIVAROXABAN 20 MG PO TABS
20.0000 mg | ORAL_TABLET | Freq: Every day | ORAL | 0 refills | Status: DC
  Filled 2023-07-21: qty 30, 30d supply, fill #0

## 2023-07-21 MED ORDER — GABAPENTIN 100 MG PO CAPS
100.0000 mg | ORAL_CAPSULE | Freq: Three times a day (TID) | ORAL | 0 refills | Status: DC
  Filled 2023-07-21: qty 90, 30d supply, fill #0

## 2023-07-21 MED ORDER — MELATONIN 5 MG PO TABS: 5.0000 mg | ORAL_TABLET | Freq: Every evening | ORAL | Status: AC | PRN

## 2023-07-21 MED ORDER — POTASSIUM CHLORIDE 20 MEQ PO PACK
40.0000 meq | PACK | Freq: Once | ORAL | Status: AC
  Administered 2023-07-21: 40 meq via ORAL
  Filled 2023-07-21: qty 2

## 2023-07-21 MED ORDER — NYSTATIN 100000 UNIT/ML MT SUSP
5.0000 mL | Freq: Four times a day (QID) | OROMUCOSAL | 0 refills | Status: DC
  Filled 2023-07-21: qty 60, 3d supply, fill #0

## 2023-07-21 NOTE — Progress Notes (Signed)
 Occupational Therapy Session Note  Patient Details  Name: Mark [name] MRN: [id number] Date of Birth: Jun 30, 1945  Today's Date: 07/21/2023 OT Individual Time: 0805-0900 & 1308-6578 OT Individual Time Calculation (min): 55 min & 83 min   Short Term Goals: Week 2:  OT Short Term Goal 1 (Week 2): Pt will donn L shoe/AFO with min A OT Short Term Goal 2 (Week 2): Pt will perform tub bench transfer into bathtub with contact guard with cues  Skilled Therapeutic Interventions/Progress Updates:  Session 1 Skilled OT intervention completed with focus on ADL retraining, functional transfers, dynamic standing balance. Pt received seated EOB, agreeable to session. No pain reported.  Pt politely declined shower and other self-care needs. Set up A for donning hearing aids and RLE shoe, however mod A for donning LLE shoe and AFO but pt able to push heel down and complete straps. CGA sit > stand using hemi walker, then CGA ambulatory transfer to w/c. Independent with hair grooming. Donned hemi sling to LUE in prep for mobility with padded neck strap placed under pt's shirt collar to maintain skin integrity but pt independent with doffing if irritation occurs and aware to remove. Transported dependently in w/c <> gym.  Supervision sit > stand and stand pivot with HW > EOM. Pt participated in the following dynamic standing balance and endurance tasks to promote independence and safety during BADLs and functional mobility: -placing and retrieving clips from theraband on long mirror. Stood once per 2 clips, 10 x2 total. CGA sit > stand without AD, and CGA needed for balance; cues needed initially for scooting forward especially with L hip as pt with neglect on L side, BLE placement and avoidance of back of knees on mat for compensatory balance, however faded to only intermittent cues when pt distracted by conversation, overall good carryover  Pt with frequent L neglect with scooting hip forward. Utilized  therapist's knees in front of pt to produce visual feedback for promotion of motor planning and sequencing needed for scooting L forward. Pt with difficulty sequencing task, as he repeatedly scooted R hip almost to the very edge of mat, but improved with L clearance with cues for off loading L buttocks and aiming for therapist's knee as target. Discussed how he can self gauge if he is sitting centered by looking at his thighs and if 1 knee length is further than the other, to promote proper stand.   Supervision sit > stand and CGA ambulatory transfer with HW > w/c. Pt with difficulty managing LLE over leg rest without getting caught; education provided on modification of lifting LLE with AFO strap for increased ROM which pt plans to translate to car transfer.  Pt remained seated in w/c, with belt alarm on/activated, and with all needs in reach at end of session.  Session 2 Skilled OT intervention completed with focus on additional family education with pt's wife and daughter present. Pt received seated in w/c, agreeable to session. No pain reported.  OT provided education on the following in prep for DC: -PROM to LUE especially at wrist/digits and forearm, to be complete multiple times a day and specifically prior to donning of resting hand splint at night -Donning/doffing of resting hand splint and wear/care -Donning/doffing of hemi sling to LUE in prep for functional mobility and specifically community mobility for dense LUE hemi; handout issued for option to purchase privately -Confirmed bathroom set up, with wife reporting tub/shower with already owned TTB. Encouraged continued use of TTB and demo provided  on technique -Advised use of hand held shower head for ease of bathing. Demonstrated method of tucking shower curtain under buttocks when seated on TTB to prevent water spillage in floor. Discussed using lateral leans for peri-washing and use/installation of grab bars for balance  -Suggested  transfer vs ambulating > toilet for drying off and dressing tasks to donn L AFO for stability with mobility -Energy conservation strategies- lukewarm water, proper ventilation via fan or cracked door to reduce steam, minimizing stands in shower especially during hair washing with eyes closed, and planning ahead for shower tasks to be rested/have rest time following in case of fatigue  Wife assisted with the following: -CGA sit > stand and ambulatory transfers using hemi walker -Supervision sit pivot on TTB in tub/shower -Managing leg rests and w/c brakes  MD, PA and nursing made aware of wife's request for prescription needed to receive pt's new intermittent cath supplies as his current ones at home require him to stand and new materials he prefers for seated and bag method.  OT assisted pt back in room with supervision sit > stand and stand pivot > EOB with HW. Wife doffed L AFO dependently. Supervision transition sit > supine in bed. Pt remained semi upright in bed, with bed alarm on/activated, and with all needs in reach at end of session.   Therapy Documentation Precautions:  Precautions Precautions: Fall Precaution/Restrictions Comments: Left hemiplegia CVA prior to admit Restrictions Weight Bearing Restrictions Per Provider Order: No    Therapy/Group: Individual Therapy  Melvyn Novas, MS, OTR/L  07/21/2023, 2:42 PM

## 2023-07-21 NOTE — Plan of Care (Signed)
  Problem: RH BOWEL ELIMINATION Goal: RH STG MANAGE BOWEL WITH ASSISTANCE Description: STG Manage Bowel with  supervision Assistance. Outcome: Progressing   Problem: RH BLADDER ELIMINATION Goal: RH STG MANAGE BLADDER WITH ASSISTANCE Description: STG Manage Bladder With supervision Assistance Outcome: Progressing   Problem: RH SKIN INTEGRITY Goal: RH STG SKIN FREE OF INFECTION/BREAKDOWN Description: Manage skin free of infection/breakdown with supervision assistance Outcome: Progressing   Problem: RH SAFETY Goal: RH STG ADHERE TO SAFETY PRECAUTIONS W/ASSISTANCE/DEVICE Description: STG Adhere to Safety Precautions With  supervision Assistance/Device. Outcome: Progressing   Problem: RH PAIN MANAGEMENT Goal: RH STG PAIN MANAGED AT OR BELOW PT'S PAIN GOAL Description: <4 w/ prns Outcome: Progressing

## 2023-07-21 NOTE — Plan of Care (Signed)

## 2023-07-21 NOTE — Progress Notes (Signed)
 PROGRESS NOTE   Subjective/Complaints: No new complaints this morning Discussed plan for d/c tomorrow Continues to have diarrhea but does not feel he needs imodium   ROS: Positives per HPI above. Denies fevers, chills, N/V, abdominal pain, chest pain, ,new weakness or paraesthesias + R Hand and R foot cramping --improved + Rash-improved not itching + SOB with activity-continues to be improved + loose BMs Objective:   No results found.  Recent Labs    07/19/23 0633 07/21/23 0535  WBC 10.5 10.3  HGB 10.1* 10.4*  HCT 31.9* 32.4*  PLT 795* 619*   Recent Labs    07/19/23 0633 07/21/23 0535  NA 140 139  K 3.3* 3.6  CL 112* 112*  CO2 21* 20*  GLUCOSE 91 98  BUN 26* 20  CREATININE 1.39* 1.31*  CALCIUM 8.9 8.9    Intake/Output Summary (Last 24 hours) at 07/21/2023 1020 Last data filed at 07/21/2023 0719 Gross per 24 hour  Intake 1440 ml  Output 1684 ml  Net -244 ml        Physical Exam: Vital Signs Blood pressure 133/76, pulse 62, temperature 98 F (36.7 C), resp. rate 18, height 5\' 11"  (1.803 m), weight 89.2 kg, SpO2 95%. Gen: no distress, normal appearing.  Sitting up in wheelchair.   HEENT: oral mucosa pink and moist, NCAT Cardio: Reg rate and rhythm. Chest: normal effort, normal rate of breathing.  Clear to auscultation bilaterally.  On room air Abd: soft, non-distended.  Positive bowel sounds, normoactive. Ext: Trace bilateral lower extremity edema Psych: pleasant, normal affect Skin: small rash on neck-continues to improve  Neuro:     Mental Status: Alert and awake, long term memory deficits Speech/Languate: Follows commands CRANIAL NERVES: 2-12 grossly intact-other than hard of hearing Atrophy right hand intrinsic muscles, scar on palm present Wearing AFO on left lower extremity   Prior exam MOTOR: RUE: 5/5 Deltoid, 5/5 Biceps, 5/5 Triceps,5/5 Grip LUE: 0/5 Deltoid, 0/5 Biceps, 1-2/5 Triceps,  1/5 Grip--isolated in sling RLE: HF 5/5, KE 5/5, ADF 5/5, APF 5/5 LLE: HF 4-/5, KE 4-/5, ADF 2-/5, APF 4-/5, stable 4/7   SENSORY: Altered to LT Lue and LLE   MSK: Increased tone LUE with hand in fist and increased elbow extensor tone Decreased dorsiflexion ROM LLE   Atrophy R hand intrinsic muscles --scar from prior surgery along palm--no sensory or motor deficits notable.  Negative Tinel's   Assessment/Plan: 1. Functional deficits which require 3+ hours per day of interdisciplinary therapy in a comprehensive inpatient rehab setting. Physiatrist is providing close team supervision and 24 hour management of active medical problems listed below. Physiatrist and rehab team continue to assess barriers to discharge/monitor patient progress toward functional and medical goals  Care Tool:  Bathing    Body parts bathed by patient: Chest, Abdomen, Front perineal area, Right upper leg, Left upper leg, Right lower leg, Left lower leg, Face, Left arm, Buttocks   Body parts bathed by helper: Right arm     Bathing assist Assist Level: Minimal Assistance - Patient > 75%     Upper Body Dressing/Undressing Upper body dressing   What is the patient wearing?: Pull over shirt    Upper  body assist Assist Level: Set up assist    Lower Body Dressing/Undressing Lower body dressing      What is the patient wearing?: Underwear/pull up, Pants     Lower body assist Assist for lower body dressing: Supervision/Verbal cueing     Toileting Toileting    Toileting assist Assist for toileting: Contact Guard/Touching assist     Transfers Chair/bed transfer  Transfers assist     Chair/bed transfer assist level: Contact Guard/Touching assist     Locomotion Ambulation   Ambulation assist      Assist level: Contact Guard/Touching assist Assistive device: Walker-hemi Max distance: 51ft   Walk 10 feet activity   Assist     Assist level: Contact Guard/Touching assist Assistive  device: Walker-hemi   Walk 50 feet activity   Assist    Assist level: Contact Guard/Touching assist Assistive device: Walker-hemi    Walk 150 feet activity   Assist Walk 150 feet activity did not occur: Safety/medical concerns (fatigue)         Walk 10 feet on uneven surface  activity   Assist Walk 10 feet on uneven surfaces activity did not occur: Safety/medical concerns (fatigue)   Assist level: Contact Guard/Touching assist Assistive device: Walker-hemi   Wheelchair     Assist Is the patient using a wheelchair?: Yes Type of Wheelchair: Manual    Wheelchair assist level: Supervision/Verbal cueing Max wheelchair distance: 157ft    Wheelchair 50 feet with 2 turns activity    Assist        Assist Level: Supervision/Verbal cueing   Wheelchair 150 feet activity     Assist      Assist Level: Supervision/Verbal cueing   Blood pressure 133/76, pulse 62, temperature 98 F (36.7 C), resp. rate 18, height 5\' 11"  (1.803 m), weight 89.2 kg, SpO2 95%.  Medical Problem List and Plan: 1. Functional deficits secondary to debility due to ITP             -patient may  shower             -ELOS/Goals: 7-10 days, Sup to Min a PT/OT/SLP             -Stable to continue CIR  -Left AFO was ordered  -will order chest x-ray due to increased dyspnea with exertion reported this afternoon- negative  -Expected discharge 4/8   2.  Antithrombotics: -DVT/anticoagulation:  Pharmaceutical: Xarelto             -antiplatelet therapy: ASA 3.Right foot neuropathic pain: scheduled for outpatient Qutenza. Tylenol prn. Continue gabapentin. Continue Norco PRN.   3-29: Pain well-controlled on current regimen  3-4 will increase gabapentin to 100 mg twice daily for neuropathic component of pain in his hand and foot  3/5 increase gabapentin to TID  3/6 hand pain improved, continue current regimen  4. Mood/Behavior/Sleep: LCSW to follow for evaluation and support.               -antipsychotic agents: N/A 5. Neuropsych/cognition: This patient is capable of making decisions on his own behalf. 6. Skin/Wound Care: Routine pressure relief measures.  7. Fluids/Electrolytes/Nutrition: Strict I/O. Daily CMET per hem/Onc 8.  Chronic ITP w/relapse: Has been received Decadron, IVIG and Nplate             --plans for rituxan on outpatient basis per Dr. Myna Hidalgo             --continue daily CMET/CBC X 4 days per input from Dr. Myna Hidalgo.    -  Per Dr. Myna Hidalgo, expected platelet increase, will drop again as OP with Rituxin initiation  -4/1 platelets elevated 963, discussed with Dr. Myna Hidalgo.  No changes necessary, he will see tomorrow.  Appreciate his assistance  -4/2 Seen by Dr. Myna Hidalgo, appreciate assistance, continue to monitor CBC 2-3 times a week  -4/3 platelets little improved/decreased to 8 98K  -4/4 improved to 796, seen by oncology again today.  Patient has been set up for outpatient Rituxan.  4/5 platelets 795 today oncology following, appreciate assistance.   Recheck labs tomorrow  9. Epistaxis: Nasal packing--augmentin X 3 d/c on 03/25. 10 .Prediabetes: Hgb A1C- 6.2. Elevated due to steriods but trending down. --Continue to monitor BS ac/hs and use SSI for elevated BS -4/3 glucose stable yesterday on CBG at 105  11. H/o CVA X 2 w/neurogenic bladder: On Xarelto and ASA.             --At baseline completes intermittent self cath several times a day.  12.Seizure d/o: Seizure free on Keppra 1500 mg bid 13. Anxiety/depression: On Buropion and Sertraline 14. CKD 3A: BUN/SCr 24/1.3--> Baseline SCr 1.3-->40/1.17. Avoid nephrotoxic medications. Repeat tomorrow since trending upward  -3-30: Continues to downtrend, BUN 25,, creatinine 1.38.  Essentially baseline.  -4/4 BUN and creatinine still overall stable but a little higher at 25/1.44, encourage oral fluids  4/5 BUN/creatinine 26/1.39, stable continue encourage oral fluids  Recheck tomorrow  .  16. Leucocytosis: May be  steroid related. WBC reviewed and is stable, repeat tomorrow   - WBC 10.5 4/5, recheck tomorrow   17. Essential HTN. Monitor BP. Continue norvasc, add on magnesium level  -4/1 controlled continue to monitor  -4/6 controlled, continue to monitor     07/21/2023    5:06 AM 07/21/2023    5:00 AM 07/20/2023    8:38 PM  Vitals with BMI  Weight  196 lbs 10 oz   BMI  27.44   Systolic 133  135  Diastolic 76  77  Pulse 62      18. Spasticity. continue baclofen  19. Suboptimal vitamin D: D3 ordered, continue 2,000U daily  20. Transaminitis: improved, prn tylenol added back    - AST normalized, ALT improving  21.  Cramping in right hand and foot--worse at nighttime. ?  RLS versus CTS and hand.  -Discussed trial of neutral wrist brace as outpatient for at least 6 weeks  - Patient reports scar on his right hand is from past surgery after he had ischemia in his finger  22.  Small rash on his neck.  Improved, d/c hydrocortisone  23. Hypokalemia: klor 40 ordered  24. Diarrhea: d/c enema  LOS: 12 days A FACE TO FACE EVALUATION WAS PERFORMED  Gracemarie Skeet P Calvyn Kurtzman 07/21/2023, 10:20 AM

## 2023-07-21 NOTE — Progress Notes (Signed)
 Occupational Therapy Discharge Summary  Patient Details  Name: Mark [name] MRN: [id number] Date of Birth: 1946-02-25  Date of Discharge from OT service:July 21, 2023  Patient has met 8 of 8 long term goals due to improved activity tolerance, improved balance, ability to compensate for deficits, functional use of  LEFT lower extremity, improved awareness, and improved coordination.  Patient to discharge at overall CGA to Supervision level.  Patient's care partner is independent to provide the necessary physical and cognitive assistance at discharge.    All goals met  Recommendation:  Patient will benefit from ongoing skilled OT services in outpatient setting to continue to advance functional skills in the area of BADL, Reduce care partner burden, and address functional use/positioning of LUE .  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 Restrictions Weight Bearing Restrictions Per Provider Order: No ADL ADL Eating: Set up Where Assessed-Eating: Wheelchair Grooming: Supervision/safety Where Assessed-Grooming: Sitting at sink Upper Body Bathing: Minimal assistance Where Assessed-Upper Body Bathing: Shower Lower Body Bathing: Supervision/safety Where Assessed-Lower Body Bathing: Shower Upper Body Dressing: Setup Where Assessed-Upper Body Dressing: Wheelchair Lower Body Dressing: Supervision/safety Where Assessed-Lower Body Dressing: Sitting at sink, Standing at sink Toileting: Contact guard Where Assessed-Toileting: Teacher, adult education: Close supervision Toilet Transfer Method: Surveyor, minerals: Raised toilet seat, Other (comment) (hemi walker) Tub/Shower Transfer: Close supervison Tub/Shower Transfer Method:  Stand pivot Tub/Shower Equipment: Insurance underwriter: Administrator, arts Method: Designer, industrial/product: Emergency planning/management officer, Other (comment) (hemi walker) Vision Baseline Vision/History: 0 No visual deficits Patient Visual Report: No change from baseline Vision Assessment?: No apparent visual deficits Perception  Perception: Within Functional Limits Praxis Praxis: Impaired Praxis Impairment Details: Motor planning Praxis-Other Comments: PTA Cognition Cognition Overall Cognitive Status: Within Functional Limits for tasks assessed Arousal/Alertness: Awake/alert Orientation Level: Person;Place;Situation Person: Oriented Place: Oriented Situation: Oriented Memory: Appears intact Awareness: Appears intact Problem Solving: Impaired Safety/Judgment: Appears intact Brief Interview for Mental Status (BIMS) Repetition of Three Words (First Attempt): 3 Temporal Orientation: Year: Correct Temporal Orientation: Month: Accurate within 5 days Temporal Orientation: Day: Correct Recall: "Sock": Yes, no cue required Recall: "Blue": Yes, no cue required Recall: "Bed": Yes, no cue required BIMS Summary Score: 15 Sensation Sensation Light Touch: Impaired Detail Light Touch Impaired Details: Impaired RLE;Impaired LLE Hot/Cold: Not tested Proprioception: Impaired Detail Proprioception Impaired Details: Impaired LLE Additional Comments: decreased sensation along LLE, absent sensation along bilateral great toes. plantarflexion and inversion tone in L ankle. Coordination Gross Motor Movements are Fluid and Coordinated: No Fine Motor Movements are Fluid and Coordinated: No Coordination and Movement Description: grossly uncoordinated due to L hemiparesis, tone in LUE/LLE, and decreased balance/coordination Finger Nose Finger Test: unable to perform on LUE due to prior CVA Heel Shin Test: unable to perform on LLE Motor  Motor Motor:  Hemiplegia;Abnormal tone Motor - Skilled Clinical Observations: L hemiparesis and hypertonia in LUE/LLE due to prior CVA - now generalized weakness/deconditioning Mobility  Bed Mobility Bed Mobility: Rolling Right;Rolling Left;Sit to Supine;Supine to Sit Rolling Right: Supervision/verbal cueing Rolling Left: Supervision/Verbal cueing Supine to Sit: Supervision/Verbal cueing Sit to Supine: Supervision/Verbal cueing Transfers Sit to Stand: Supervision/Verbal cueing Stand to Sit: Supervision/Verbal cueing  Trunk/Postural Assessment  Cervical Assessment Cervical Assessment: Exceptions to [facility] (forward head with R rotation) Thoracic Assessment Thoracic Assessment: Exceptions  to [facility] (rounded shoulders) Lumbar Assessment Lumbar Assessment: Exceptions to [facility] (posterior pelvic tilt) Postural Control Postural Control: Deficits on evaluation Righting Reactions: delayed on L Protective Responses: delayed on L  Balance Balance Balance Assessed: Yes Static Sitting Balance Static Sitting - Balance Support: Feet supported;No upper extremity supported Static Sitting - Level of Assistance: 6: Modified independent (Device/Increase time) Dynamic Sitting Balance Dynamic Sitting - Balance Support: Feet supported;No upper extremity supported Dynamic Sitting - Level of Assistance: 6: Modified independent (Device/Increase time) Static Standing Balance Static Standing - Balance Support: Right upper extremity supported;During functional activity (hemi walker) Static Standing - Level of Assistance: 5: Stand by assistance (supervision) Dynamic Standing Balance Dynamic Standing - Balance Support: Right upper extremity supported;During functional activity Dynamic Standing - Level of Assistance: 5: Stand by assistance (supervision) Dynamic Standing - Comments: with transfers and gait Extremity/Trunk Assessment RUE Assessment RUE Assessment: Within Functional Limits LUE Assessment LUE Assessment: Exceptions  to [facility] General Strength Comments: 0/5 Deltoid, 0/5 Biceps, 1-2/5 Triceps, 1/5 Grip LUE Tone LUE Tone: Modified Ashworth;Moderate Body Part - Modified Ashworth Scale: Elbow;Wrist;Fingers;Thumb Elbow - Modified Ashworth Scale for Grading Hypertonia LUE: No increase in muscle tone Wrist - Modified Ashworth Scale for Grading Hypertonia LUE: Slight increase in muscle tone, manifested by a catch, followed by minimal resistance throughout the remainder (less than half) of the ROM Fingers - Modified Ashworth Scale for Grading Hypertonia LUE: More marked increase in muscle tone through most of the ROM, but affected part(s) easily moved Thumb - Modified Ashworth Scale for Grading Hypertonia LUE: More marked increase in muscle tone through most of the ROM, but affected part(s) easily moved   [employer], MS, OTR/L  07/21/2023, 2:44 PM

## 2023-07-21 NOTE — Progress Notes (Signed)
 Inpatient Rehabilitation Discharge Medication Review by a Pharmacist  A complete drug regimen review was completed for this patient to identify any potential clinically significant medication issues.  High Risk Drug Classes Is patient taking? Indication by Medication  Antipsychotic No   Anticoagulant Yes Xarelto- lupus anticoagulant  Antibiotic Yes Nystatin- thrush  Opioid No   Antiplatelet Yes Aspirin- cva ppx  Hypoglycemics/insulin No   Vasoactive Medication Yes Norvasc- HTN  Chemotherapy No   Other Yes Gabapentin- neuropathy Baclofen- muscle spasms Wellbutrin/Zoloft- MDD Keppra- seizure ppx Prilosec- GERD Crestor- HLD Melatonin- sleep     Type of Medication Issue Identified Description of Issue Recommendation(s)  Drug Interaction(s) (clinically significant)     Duplicate Therapy     Allergy     No Medication Administration End Date     Incorrect Dose     Additional Drug Therapy Needed     Significant med changes from prior encounter (inform family/care partners about these prior to discharge).    Other       Clinically significant medication issues were identified that warrant physician communication and completion of prescribed/recommended actions by midnight of the next day:  No   Time spent performing this drug regimen review (minutes):  30   Sonnia Strong BS, PharmD, BCPS Clinical Pharmacist 07/21/2023 2:39 PM  Contact: [phone] after 3 PM  "Be curious, not judgmental..." -Debbora Dus

## 2023-07-21 NOTE — Progress Notes (Signed)
 Physical Therapy Discharge Summary  Patient Details  Name: Mark [name] MRN: [id number] Date of Birth: [birth year]  Date of Discharge from PT service:July 21, 2023  Today's Date: 07/21/2023 PT Individual Time: 4098-1191 PT Individual Time Calculation (min): 56 min   Patient has met 7 of 9 long term goals due to improved activity tolerance, improved balance, improved postural control, increased strength, increased range of motion, ability to compensate for deficits, improved awareness, and improved coordination. Patient to discharge at a wheelchair level Supervision. Patient's care partner is independent to provide the necessary physical assistance at discharge. Pt's family attended family education training and verbalized and demonstrated confidence with all tasks to ensure safe discharge home.   Reasons goals not met: Pt did not meet ambulation goal of 150ft, as pt only able to ambulate up to 161ft with hemi walker due to fatigue. Pt did not meet car transfer goal of supervision as pt currently requires CGA for safety and due to weakness.  Recommendation:  Patient will benefit from ongoing skilled PT services in outpatient setting to continue to advance safe functional mobility, address ongoing impairments in transfers, generalized strengthening and endurance, dynamic standing balance/coordination, gait training, NMR, and to minimize fall risk.  Equipment: No equipment provided - already has all DME  Reasons for discharge: treatment goals met  Patient/family agrees with progress made and goals achieved: Yes  Today's Interventions: Received pt sitting in WC with Thayer Ohm from [facility] present fitting pt with new AFO. Pt agreeable to PT treatment and denied any pain during session. Session with emphasis on discharge planning, functional mobility/transfers, generalized strengthening and endurance, dynamic standing balance/coordination, gait training, and simulated car transfers. MD arrived for  morning rounds, then went through sensation, MMT, and pain interference questionnaire in preparation for discharge. Pt performed WC mobility 146ft using L hemi technique and mod I. In ortho gym, pt performed simulated car transfer with hemi walker and min A for LLE management in/out of car.  Pt then ambulated 62ft on uneven surfaces (ramp) with hemi walker and CGA with cues for hemi walker placement. Pt stood from [facility] with hemi walker and supervision and ambulated 149ft with hemi walker and CGA/close supervision - limited by fatigue. Returned to room and concluded session with pt sitting in WC, needs within reach, and seatbelt alarm on.   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 Rarely or not at all Pain Interference with Therapy Activities: 0. Does not apply - I have not received rehabilitationtherapy in the past 5 days Pain Interference with Day-to-Day Activities: 1. Rarely or not at all Cognition Overall Cognitive Status: Within Functional Limits for tasks assessed Arousal/Alertness: Awake/alert Orientation Level: Oriented X4 Memory: Impaired Awareness: Impaired Problem Solving: Impaired Safety/Judgment: Appears intact Sensation Sensation Light Touch: Impaired Detail Light Touch Impaired Details: Impaired RLE;Impaired LLE Hot/Cold: Not tested Proprioception: Impaired Detail Proprioception Impaired Details: Impaired LLE Additional Comments: decreased sensation along LLE, absent sensation along bilateral great toes. plantarflexion and inversion tone in L ankle. Coordination Gross Motor Movements are Fluid and Coordinated: No Fine Motor Movements are Fluid and Coordinated: No Coordination and Movement Description: grossly uncoordinated due to L hemiparesis, tone in  LUE/LLE, and decreased balance/coordination Finger Nose Finger Test: unable to perform on LUE due to prior CVA Heel Shin Test: unable to perform on LLE Motor  Motor Motor: Hemiplegia;Abnormal tone Motor - Skilled Clinical  Observations: L hemiparesis and hypertonia in LUE/LLE due to prior CVA - now generalized weakness/deconditioning  Mobility Bed Mobility Bed Mobility: Rolling Right;Rolling Left;Sit to Supine;Supine to Sit Rolling Right: Supervision/verbal cueing Rolling Left: Supervision/Verbal cueing Supine to Sit: Supervision/Verbal cueing Sit to Supine: Supervision/Verbal cueing Transfers Transfers: Sit to Stand;Stand to Sit;Stand Pivot Transfers Sit to Stand: Supervision/Verbal cueing Stand to Sit: Supervision/Verbal cueing Stand Pivot Transfers: Supervision/Verbal cueing Stand Pivot Transfer Details: Verbal cues for sequencing;Verbal cues for technique Stand Pivot Transfer Details (indicate cue type and reason): verbal cues for foot positioning Transfer (Assistive device): Hemi-walker Locomotion  Gait Ambulation: Yes Gait Assistance: Supervision/Verbal cueing Gait Distance (Feet): 100 Feet Assistive device: Hemi-walker Gait Assistance Details: Verbal cues for gait pattern;Verbal cues for sequencing;Verbal cues for technique;Verbal cues for safe use of DME/AE Gait Assistance Details: verbal cues for hemi walker placement Gait Gait: Yes Gait Pattern: Impaired Gait Pattern: Step-to pattern;Decreased step length - right;Decreased step length - left;Decreased stride length;Decreased dorsiflexion - left;Decreased hip/knee flexion - left;Decreased weight shift to left;Poor foot clearance - right;Poor foot clearance - left;Narrow base of support Gait velocity: decreased Stairs / Additional Locomotion Stairs: Yes Stairs Assistance: Contact Guard/Touching assist Stair Management Technique: One rail Right;Step to pattern;Forwards Number of Stairs: 8 Height of Stairs: 6 Ramp:  Contact Guard/touching assist (hemi walker) Pick up small object from the floor assist level: Dependent - Patient 0% Wheelchair Mobility Wheelchair Mobility: Yes Wheelchair Assistance: Independent with assistive device Wheelchair Propulsion: Right upper extremity;Right lower extremity Wheelchair Parts Management: Needs assistance Distance: 18ft  Trunk/Postural Assessment  Cervical Assessment Cervical Assessment: Exceptions to [facility] (forward head with R rotation) Thoracic Assessment Thoracic Assessment: Exceptions to [facility] (rounded shoulders) Lumbar Assessment Lumbar Assessment: Exceptions to [facility] (posterior pelvic tilt) Postural Control Postural Control: Deficits on evaluation Righting Reactions: delayed on L Protective Responses: delayed on L  Balance Balance Balance Assessed: Yes Static Sitting Balance Static Sitting - Balance Support: Feet supported;No upper extremity supported Static Sitting - Level of Assistance: 6: Modified independent (Device/Increase time) Dynamic Sitting Balance Dynamic Sitting - Balance Support: Feet supported;No upper extremity supported Dynamic Sitting - Level of Assistance: 6: Modified independent (Device/Increase time) Static Standing Balance Static Standing - Balance Support: Right upper extremity supported;During functional activity (hemi walker) Static Standing - Level of Assistance: 5: Stand by assistance (supervision) Dynamic Standing Balance Dynamic Standing - Balance Support: Right upper extremity supported;During functional activity Dynamic Standing - Level of Assistance: 5: Stand by assistance (supervision) Dynamic Standing - Comments: with transfers and gait Extremity Assessment  RLE Assessment RLE Assessment: Exceptions to [facility] General Strength Comments: tested sitting in WC RLE Strength Right Hip Flexion: 4/5 Right Hip ABduction: 4-/5 Right Hip ADduction: 4-/5 Right Knee Flexion: 4/5 Right Knee Extension: 4/5 Right Ankle Dorsiflexion:  4+/5 Right Ankle Plantar Flexion: 4+/5 LLE Assessment LLE Assessment: Exceptions to [facility] General Strength Comments: tested sitting EOB LLE Strength Left Hip Flexion: 4-/5 Left Hip ABduction: 3+/5 Left Hip ADduction: 3+/5 Left Knee Flexion: 4-/5 Left Knee Extension: 4-/5 Left Ankle Dorsiflexion: 0/5 Left Ankle Plantar Flexion: 1/5   Marlana Salvage Zaunegger Blima Rich PT, DPT 07/21/2023, 7:08 AM

## 2023-07-21 NOTE — Discharge Instructions (Addendum)
 Inpatient Rehab Discharge Instructions  Mark [name] Discharge date and time: 07/22/23   Activities/Precautions/ Functional Status: Activity: no lifting, driving, or strenuous exercise till cleared by  MD Diet: regular diet Wound Care: none needed   Functional status:  ___ No restrictions     ___ Walk up steps independently _X__ 24/7 supervision/assistance   ___ Walk up steps with assistance ___ Intermittent supervision/assistance  ___ Bathe/dress independently ___ Walk with walker     ___ Bathe/dress with assistance ___ Walk Independently    ___ Shower independently ___ Walk with assistance    _X__ Shower with assistance _X__ No alcohol     ___ Return to work/school ________  Special Instructions:  COMMUNITY REFERRALS UPON DISCHARGE:    Home Health:   PT   OT   RN                 Agency:[facility]   Phone: [phone]   Medical Equipment/Items Ordered:HAS ALL EQUIPMENT FROM PAST ADMISSIONS                                                 Agency/Supplier:NA   My questions have been answered and I understand these instructions. I will adhere to these goals and the provided educational materials after my discharge from the hospital.  Patient/Caregiver Signature _______________________________ Date __________  Clinician Signature _______________________________________ Date __________  Please bring this form and your medication list with you to all your follow-up doctor's appointments.

## 2023-07-22 ENCOUNTER — Other Ambulatory Visit (HOSPITAL_COMMUNITY): Payer: Self-pay

## 2023-07-22 NOTE — Discharge Summary (Signed)
 Physician Discharge Summary  Patient ID: Mark [name] MRN: [id number] DOB/AGE: 15-Jun-1945 78 y.o.  Admit date: 07/09/2023 Discharge date: 07/22/2023  Discharge Diagnoses:  Principal Problem:   Debility Active Problems:   Chronic ITP (idiopathic thrombocytopenia) (HCC)   Rash and nonspecific skin eruption   Neuropathic pain   Hypokalemia   Discharged Condition: stable  Significant Diagnostic Studies: DG Chest 2 View Result Date: 07/15/2023 CLINICAL DATA:  Shortness of breath EXAM: CHEST - 2 VIEW COMPARISON:  06/29/2020 FINDINGS: The heart size and mediastinal contours are within normal limits. Both lungs are clear. The visualized skeletal structures are unremarkable. IMPRESSION: No active cardiopulmonary disease. Electronically Signed   By: Janeece Mechanic M.D.   On: 07/15/2023 20:01    Labs:  Basic Metabolic Panel: Recent Labs  Lab 07/17/23 0629 07/19/23 0633 07/21/23 0535  NA 138 140 139  K 3.6 3.3* 3.6  CL 105 112* 112*  CO2 23 21* 20*  GLUCOSE 105* 91 98  BUN 25* 26* 20  CREATININE 1.44* 1.39* 1.31*  CALCIUM 8.7* 8.9 8.9    CBC: Recent Labs  Lab 07/17/23 0629 07/18/23 0614 07/19/23 0633 07/21/23 0535  WBC 10.5 11.3* 10.5 10.3  NEUTROABS 5.3 5.4  --  5.4  HGB 10.7* 10.5* 10.1* 10.4*  HCT 32.9* 33.0* 31.9* 32.4*  MCV 93.7 96.5 93.5 94.5  PLT 890* 796* 795* 619*    CBG: No results for input(s): "GLUCAP" in the last 168 hours.  Brief HPI:   Mark [name] is a 78 y.o. 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 He was treated with Nplate, IV steroids and nasal packing with Augmentin for coverage. Rituxan to be resumed on outpatient basis per Dr. Maria Shiner.  Elevated BS treated with SSI and platelets improved with rise in WBC felt to be reactive. PT/OT was consulted and patient was  noted to be requiring min to mod assist with ADLs and have significant balance deficits. CIR recommended due to functional decline.    Hospital Course: Mark [name] was admitted to rehab 07/09/2023 for inpatient therapies to consist of PT and OT at least three hours five days a week. Past admission physiatrist, therapy team and rehab RN have worked together to provide customized collaborative inpatient rehab. Platelets have been monitored with serial check of CBC and have been stable. No further nose bleeds noted during his stay. Abnormal LFTs are resolving. His blood pressures were monitored on TID basis and have been stable. He has been seizure free during his stay. Serial check of labs showed BUN/SCr to fluctuate with rise to 1.44 which improved with increase in fluid intake.   He was started on I/O caths qid to keep bladder volumes less than 350 cc. He was unable to perform self caths and wife was educated on cathing patient. His blood sugars were monitored with ac/hs cbg check and have improved. Po intake has been good. Hypokalemia has resolved with supplementation. Magnesium was added briefly but d/c due to diarrhea SE. Gabapentin was titrated up to help manage neuropathic pain. Left AFO was ordered to help with gait quality and chronic foot drop. He made steady gains during his stay and requires CGA to min assist at discharge. He will continue to receive follow up HHPT, HHRN  and HHOT by [facility] after discharge.  Rehab course: During patient's stay in rehab weekly team conferences were held to monitor patient's progress, set goals and discuss barriers to discharge. At admission, patient required mod assist with ADL tasks and with mobility. ST evaluation revealed cognition to be at baseline and safe swallow strategies were reviewed as swallow was felt to be at baseline. He  has had improvement in activity tolerance, balance, postural control as well as ability to compensate for deficits.  He requires CGA to min assist with ADL tasks. He requires supervision with cues for transfers and to ambulate 100' with use of HW. Family education has been completed with wife.    Discharge disposition: 06-Home-Health Care Svc  Diet: Regular.   Special Instructions: Recommend repeat check CBC/BMET in 1-2 weeks to monitor for stability.   Allergies as of 07/22/2023       Reactions   Diphenhydramine Other (See Comments)   Affects kidneys   Famotidine Other (See Comments)   Affects kidneys   Other Other (See Comments)   Patient is a VEGETARIAN Patient is a diabetic.   Beef-derived Drug Products    Chicken Protein    Pork-derived Products         Medication List     STOP taking these medications    HYDROcodone-acetaminophen 10-325 MG tablet Commonly known as: NORCO       TAKE these medications    amLODipine 10 MG tablet Commonly known as: NORVASC Take 10 mg by mouth at bedtime.   aspirin EC 81 MG tablet Take 81 mg by mouth daily. Swallow whole.   baclofen 10 MG tablet Commonly known as: LIORESAL Take 10 mg by mouth in the morning and at bedtime.   buPROPion 150 MG 24 hr tablet Commonly known as: WELLBUTRIN XL Take 150 mg by mouth in the morning.   fluticasone 50 MCG/ACT nasal spray Commonly known as: FLONASE Place 2 sprays into both nostrils daily as needed for allergies or rhinitis.   gabapentin 100 MG capsule Commonly known as: NEURONTIN Take 1 capsule (100 mg total) by mouth 3 (three) times daily. What changed: when to take this   levETIRAcetam 500 MG tablet Commonly known as: KEPPRA Take 1,500 mg by mouth in the morning and at bedtime.   melatonin 5 MG Tabs Take 1 tablet (5 mg total) by mouth at bedtime as needed.   nystatin 100000 UNIT/ML suspension Commonly known as: MYCOSTATIN Take 5 mLs (500,000 Units total) by mouth 4 (four) times daily.   omeprazole 40 MG capsule Commonly known as: PRILOSEC Take 40 mg by mouth daily before breakfast.    polyethylene glycol 17 g packet Commonly known as: MIRALAX / GLYCOLAX Take 17 g by mouth daily as needed. What changed: reasons to take this   rosuvastatin 40 MG tablet Commonly known as: CRESTOR Take 40 mg by mouth at bedtime.   sertraline 100 MG tablet Commonly known as: ZOLOFT Take 200 mg by mouth in the morning.   Theratears 0.25 % Soln Generic drug: Carboxymethylcellulose Sodium Place 1 drop into both eyes 2 (two) times daily as needed (for redness or irritation).   TYLENOL 500 MG tablet Generic drug: acetaminophen Take 500 mg by mouth See admin instructions. Take 500 mg by mouth at bedtime and an additional 500 mg once a day as needed for pain or headaches   Vitamin D3 50 MCG (2000 UT) Tabs Take 2,000 Units by mouth at bedtime.   Xarelto 20 MG Tabs tablet Generic drug: rivaroxaban Take 1 tablet (20 mg  total) by mouth daily with supper. What changed:  how much to take when to take this         Signed: Zelda Hickman 07/22/2023, 9:27 AM

## 2023-07-22 NOTE — Progress Notes (Signed)
 PROGRESS NOTE   Subjective/Complaints: No new complaints this morning Wife at bedside is very appreciative of care here Discussed outpatient Qutenza patches  ROS: Positives per HPI above. Denies fevers, chills, N/V, abdominal pain, chest pain, ,new weakness or paraesthesias + R Hand and R foot cramping --improved + Rash-improved not itching + SOB with activity-continues to be improved + loose BMs Objective:   No results found.  Recent Labs    07/21/23 0535  WBC 10.3  HGB 10.4*  HCT 32.4*  PLT 619*   Recent Labs    07/21/23 0535  NA 139  K 3.6  CL 112*  CO2 20*  GLUCOSE 98  BUN 20  CREATININE 1.31*  CALCIUM 8.9    Intake/Output Summary (Last 24 hours) at 07/22/2023 0925 Last data filed at 07/22/2023 0804 Gross per 24 hour  Intake 880 ml  Output 1085 ml  Net -205 ml        Physical Exam: Vital Signs Blood pressure 129/74, pulse 70, temperature 98.4 F (36.9 C), temperature source Oral, resp. rate 18, height 5\' 11"  (1.803 m), weight 90.6 kg, SpO2 96%. Gen: no distress, normal appearing.  Sitting up in wheelchair.   HEENT: oral mucosa pink and moist, NCAT Cardio: Reg rate and rhythm. Chest: normal effort, normal rate of breathing.  Clear to auscultation bilaterally.  On room air Abd: soft, non-distended.  Positive bowel sounds, normoactive. Ext: Trace bilateral lower extremity edema Psych: pleasant, normal affect Skin: small rash on neck-continues to improve  Neuro:     Mental Status: Alert and awake, long term memory deficits Speech/Languate: Follows commands CRANIAL NERVES: 2-12 grossly intact-other than hard of hearing Atrophy right hand intrinsic muscles, scar on palm present Wearing AFO on left lower extremity   Prior exam MOTOR: RUE: 5/5 Deltoid, 5/5 Biceps, 5/5 Triceps,5/5 Grip LUE: 0/5 Deltoid, 0/5 Biceps, 1-2/5 Triceps, 1/5 Grip--isolated in sling RLE: HF 5/5, KE 5/5, ADF 5/5, APF  5/5 LLE: HF 4-/5, KE 4-/5, ADF 2-/5, APF 4-/5, stable 4/8   SENSORY: Altered to LT Lue and LLE   MSK: Increased tone LUE with hand in fist and increased elbow extensor tone Decreased dorsiflexion ROM LLE   Atrophy R hand intrinsic muscles --scar from prior surgery along palm--no sensory or motor deficits notable.  Negative Tinel's   Assessment/Plan: 1. Functional deficits which require 3+ hours per day of interdisciplinary therapy in a comprehensive inpatient rehab setting. Physiatrist is providing close team supervision and 24 hour management of active medical problems listed below. Physiatrist and rehab team continue to assess barriers to discharge/monitor patient progress toward functional and medical goals  Care Tool:  Bathing    Body parts bathed by patient: Chest, Abdomen, Front perineal area, Right upper leg, Left upper leg, Right lower leg, Left lower leg, Face, Left arm, Buttocks   Body parts bathed by helper: Right arm     Bathing assist Assist Level: Minimal Assistance - Patient > 75%     Upper Body Dressing/Undressing Upper body dressing   What is the patient wearing?: Pull over shirt    Upper body assist Assist Level: Set up assist    Lower Body Dressing/Undressing Lower body dressing  What is the patient wearing?: Underwear/pull up, Pants     Lower body assist Assist for lower body dressing: Supervision/Verbal cueing     Toileting Toileting    Toileting assist Assist for toileting: Contact Guard/Touching assist     Transfers Chair/bed transfer  Transfers assist     Chair/bed transfer assist level: Supervision/Verbal cueing     Locomotion Ambulation   Ambulation assist      Assist level: Supervision/Verbal cueing Assistive device: Walker-hemi Max distance: 145ft   Walk 10 feet activity   Assist     Assist level: Supervision/Verbal cueing Assistive device: Walker-hemi   Walk 50 feet activity   Assist    Assist  level: Supervision/Verbal cueing Assistive device: Walker-hemi    Walk 150 feet activity   Assist Walk 150 feet activity did not occur: Safety/medical concerns (fatigue)         Walk 10 feet on uneven surface  activity   Assist Walk 10 feet on uneven surfaces activity did not occur: Safety/medical concerns (fatigue)   Assist level: Contact Guard/Touching assist Assistive device: Walker-hemi   Wheelchair     Assist Is the patient using a wheelchair?: Yes Type of Wheelchair: Manual    Wheelchair assist level: Independent Max wheelchair distance: 172ft    Wheelchair 50 feet with 2 turns activity    Assist        Assist Level: Independent   Wheelchair 150 feet activity     Assist      Assist Level: Independent   Blood pressure 129/74, pulse 70, temperature 98.4 F (36.9 C), temperature source Oral, resp. rate 18, height 5\' 11"  (1.803 m), weight 90.6 kg, SpO2 96%.  Medical Problem List and Plan: 1. Functional deficits secondary to debility due to ITP             -patient may  shower             -ELOS/Goals: 7-10 days, Sup to Min a PT/OT/SLP             D/c home  -Left AFO was ordered  -will order chest x-ray due to increased dyspnea with exertion reported this afternoon- negative  -Expected discharge 4/8   2.  Antithrombotics: -DVT/anticoagulation:  Pharmaceutical: continue Xarelto             -antiplatelet therapy: ASA 3.Right foot neuropathic pain: scheduled for outpatient Qutenza. Tylenol prn. Continue gabapentin. Continue Norco PRN.   3-29: Pain well-controlled on current regimen  3-4 will increase gabapentin to 100 mg twice daily for neuropathic component of pain in his hand and foot  3/5 increase gabapentin to TID  3/6 hand pain improved, continue current regimen  4. Mood/Behavior/Sleep: LCSW to follow for evaluation and support.              -antipsychotic agents: N/A 5. Neuropsych/cognition: This patient is capable of making  decisions on his own behalf. 6. Skin/Wound Care: Routine pressure relief measures.  7. Fluids/Electrolytes/Nutrition: Strict I/O. Daily CMET per hem/Onc 8.  Chronic ITP w/relapse: Has been received Decadron, IVIG and Nplate             --plans for rituxan on outpatient basis per Dr. Myna Hidalgo             --continue daily CMET/CBC X 4 days per input from Dr. Myna Hidalgo.    - Per Dr. Myna Hidalgo, expected platelet increase, will drop again as OP with Rituxin initiation  -4/1 platelets elevated 963, discussed with Dr. Myna Hidalgo.  No changes  necessary, he will see tomorrow.  Appreciate his assistance  -4/2 Seen by Dr. Myna Hidalgo, appreciate assistance, continue to monitor CBC 2-3 times a week  -4/3 platelets little improved/decreased to 8 98K  -4/4 improved to 796, seen by oncology again today.  Patient has been set up for outpatient Rituxan.  4/5 platelets 795 today oncology following, appreciate assistance.   Recheck labs tomorrow  9. Epistaxis: Nasal packing--augmentin X 3 d/c on 03/25. 10 .Prediabetes: Hgb A1C- 6.2. Elevated due to steriods but trending down. --Continue to monitor BS ac/hs and use SSI for elevated BS -4/3 glucose stable yesterday on CBG at 105  11. H/o CVA X 2 w/neurogenic bladder: On Xarelto and ASA.             --At baseline completes intermittent self cath several times a day.  12.Seizure d/o: Seizure free on Keppra 1500 mg bid 13. Anxiety/depression: On Buropion and Sertraline 14. CKD 3A: BUN/SCr 24/1.3--> Baseline SCr 1.3-->40/1.17. Avoid nephrotoxic medications. Repeat tomorrow since trending upward  -3-30: Continues to downtrend, BUN 25,, creatinine 1.38.  Essentially baseline.  -4/4 BUN and creatinine still overall stable but a little higher at 25/1.44, encourage oral fluids  4/5 BUN/creatinine 26/1.39, stable continue encourage oral fluids  Recheck tomorrow  .  16. Leucocytosis: May be steroid related. WBC reviewed and is stable, repeat tomorrow   - WBC 10.5 4/5, recheck  tomorrow   17. Essential HTN. Monitor BP. Continue norvasc, add on magnesium level  -4/1 controlled continue to monitor  -4/6 controlled, continue to monitor     07/22/2023    5:27 AM 07/22/2023    5:00 AM 07/21/2023    8:24 PM  Vitals with BMI  Weight  199 lbs 12 oz   BMI  27.87   Systolic 129  126  Diastolic 74  73  Pulse 70      18. Spasticity. continue baclofen  19. Suboptimal vitamin D: D3 ordered, continue 2,000U daily  20. Transaminitis: improved, prn tylenol added back    - AST normalized, ALT improving  21.  Cramping in right hand and foot--worse at nighttime. ?  RLS versus CTS and hand.  -Discussed trial of neutral wrist brace as outpatient for at least 6 weeks  - Patient reports scar on his right hand is from past surgery after he had ischemia in his finger  22.  Small rash on his neck.  Improved, d/c hydrocortisone  23. Hypokalemia: klor 40 ordered, monitor outpatient  24. Diarrhea: d/c enema, d/c magnesium   >30 minutes spent in discharge of patient including review of medications and follow-up appointments, physical examination, and in answering all patient's questions   LOS: 13 days A FACE TO FACE EVALUATION WAS PERFORMED  Jawad Wiacek P Keilly Fatula 07/22/2023, 9:25 AM

## 2023-07-22 NOTE — Progress Notes (Signed)
 As expected, the platelet count is slowly dropping.  The white cell count is 10.3.  Hemoglobin 10.4.  Platelet count 619,000.  Sounds like he will go home today.  A ramp has been built at his home so he can get out a lot more easily.  He feels okay.  He has a little bit of neuropathy in the right hand.  He has had no issues with nausea or vomiting.  He is eating okay.  There is no bleeding.  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  He has had no rashes.   His vital signs are all stable.  Temperature 98.4.  Pulse 70.  Blood pressure 129/74.  His lungs are clear bilaterally.  He has good breath sounds bilaterally.  Cardiac exam regular rate and rhythm.  He has no murmurs.  Abdomen soft.  Bowel sounds are present.  There is no fluid wave.  There is no palpable liver or spleen tip.  Extremities shows no clubbing, cyanosis or edema.  He does have the weakness overall in the left side.  Skin exam shows very rare ecchymoses.  There is no petechia.  Neurological exam shows a weakness on the left side.   Ms. Priest is doing okay.  He should be going home today.  I will have to get him set up with outpatient Rituxan.  This is to try to keep his platelet count higher.  I know that it will drop.  Ideally, he probably would have his spleen taken out.  However, given his overall physical state, this would be very challenging undertaking.  We will call him as to when everything will be set up in our office.   I really do appreciate all great care that he received from the rehab staff.   Christin Bach, MD  Duwayne Heck 54:17

## 2023-07-22 NOTE — Progress Notes (Signed)
 Inpatient Rehabilitation Care Coordinator Discharge Note   Patient Details  Name: Mark [name] MRN: [id number] Date of Birth: 08-07-1945   Discharge location: HOME WITH WIFE WHO CAN PROVIDE SUPERVISION LEVEL  Length of Stay: 13 DAYS  Discharge activity level: SUPERVISION-SBA  Home/community participation: ACTIVE  Patient response VF:IEPPIR Literacy - How often do you need to have someone help you when you read instructions, pamphlets, or other written material from your doctor or pharmacy?: Never  Patient response JJ:OACZYS Isolation - How often do you feel lonely or isolated from those around you?: Never  Services provided included: MD, RD, PT, OT, SLP, RN, CM, Pharmacy, SW  Financial Services:  Field seismologist Utilized: Private Insurance [employer] MEDICARE  Choices offered to/list presented to: PT AND WIFE  Follow-up services arranged:  Patient/Family request agency HH/DME, Home Health Home Health Agency: [facility]  PT  OT  RN   HAS ALL DME FROM PAST ADMISSIONS   HH/DME Requested Agency: ACTIVE PT WITH  Patient response to transportation need: Is the patient able to respond to transportation needs?: Yes In the past 12 months, has lack of transportation kept you from medical appointments or from getting medications?: No In the past 12 months, has lack of transportation kept you from meetings, work, or from getting things needed for daily living?: No   Patient/Family verbalized understanding of follow-up arrangements:  Yes  Individual responsible for coordination of the follow-up plan: Nathanial Millman [phone]  Confirmed correct DME delivered: Lucy Chris 07/22/2023    Comments (or additional information):WIFE WAS IN FOR FAMILY EDUCATION. RAMP COMPLETED AT HOME FOR EASIER ACCESS  Summary of Stay    Date/Time Discharge Planning CSW  07/16/23 [phone] Home with wife who is not able to assist due to own health issues. He is doing quite well. Awaiting  recommendations RGD       Alexsis Branscom, Lemar Livings

## 2023-07-24 ENCOUNTER — Other Ambulatory Visit: Payer: Self-pay | Admitting: Hematology & Oncology

## 2023-07-24 DIAGNOSIS — D693 Immune thrombocytopenic purpura: Secondary | ICD-10-CM

## 2023-07-24 NOTE — Progress Notes (Signed)
START OFF PATHWAY REGIMEN - Other   [id number]:Rituximab IV/SUBQ D1 q7 Days:   Cycle 1: A cycle is 7 days:     Rituximab-xxxx    Cycles 2 and beyond: A cycle is every 7 days:     Rituximab and hyaluronidase human   **Always confirm dose/schedule in your pharmacy ordering system**  Patient Characteristics: Intent of Therapy: Curative Intent, Discussed with Patient 

## 2023-07-25 ENCOUNTER — Encounter: Payer: Self-pay | Admitting: Hematology & Oncology

## 2023-07-25 ENCOUNTER — Other Ambulatory Visit (HOSPITAL_COMMUNITY): Payer: Self-pay

## 2023-07-28 ENCOUNTER — Other Ambulatory Visit: Payer: Self-pay

## 2023-07-28 ENCOUNTER — Inpatient Hospital Stay (HOSPITAL_COMMUNITY)
Admission: EM | Admit: 2023-07-28 | Discharge: 2023-08-06 | DRG: 698 | Disposition: A | Discharge status: Rehabilitation Facility | Attending: Family Medicine | Admitting: Family Medicine

## 2023-07-28 ENCOUNTER — Emergency Department (HOSPITAL_COMMUNITY)

## 2023-07-28 ENCOUNTER — Encounter (HOSPITAL_COMMUNITY): Payer: Self-pay

## 2023-07-28 DIAGNOSIS — K5900 Constipation, unspecified: Secondary | ICD-10-CM | POA: Diagnosis not present

## 2023-07-28 DIAGNOSIS — A419 Sepsis, unspecified organism: Secondary | ICD-10-CM | POA: Diagnosis not present

## 2023-07-28 DIAGNOSIS — R5381 Other malaise: Secondary | ICD-10-CM | POA: Diagnosis present

## 2023-07-28 DIAGNOSIS — Z87891 Personal history of nicotine dependence: Secondary | ICD-10-CM

## 2023-07-28 DIAGNOSIS — Y828 Other medical devices associated with adverse incidents: Secondary | ICD-10-CM | POA: Diagnosis not present

## 2023-07-28 DIAGNOSIS — Z79899 Other long term (current) drug therapy: Secondary | ICD-10-CM | POA: Diagnosis not present

## 2023-07-28 DIAGNOSIS — B9689 Other specified bacterial agents as the cause of diseases classified elsewhere: Secondary | ICD-10-CM | POA: Diagnosis present

## 2023-07-28 DIAGNOSIS — R339 Retention of urine, unspecified: Secondary | ICD-10-CM | POA: Diagnosis not present

## 2023-07-28 DIAGNOSIS — R197 Diarrhea, unspecified: Secondary | ICD-10-CM | POA: Diagnosis not present

## 2023-07-28 DIAGNOSIS — D693 Immune thrombocytopenic purpura: Secondary | ICD-10-CM | POA: Diagnosis not present

## 2023-07-28 DIAGNOSIS — R8281 Pyuria: Secondary | ICD-10-CM | POA: Diagnosis not present

## 2023-07-28 DIAGNOSIS — Z9081 Acquired absence of spleen: Secondary | ICD-10-CM

## 2023-07-28 DIAGNOSIS — I82409 Acute embolism and thrombosis of unspecified deep veins of unspecified lower extremity: Secondary | ICD-10-CM | POA: Diagnosis present

## 2023-07-28 DIAGNOSIS — Z7982 Long term (current) use of aspirin: Secondary | ICD-10-CM | POA: Diagnosis not present

## 2023-07-28 DIAGNOSIS — K219 Gastro-esophageal reflux disease without esophagitis: Secondary | ICD-10-CM | POA: Diagnosis not present

## 2023-07-28 DIAGNOSIS — R0989 Other specified symptoms and signs involving the circulatory and respiratory systems: Secondary | ICD-10-CM | POA: Diagnosis not present

## 2023-07-28 DIAGNOSIS — B9620 Unspecified Escherichia coli [E. coli] as the cause of diseases classified elsewhere: Secondary | ICD-10-CM | POA: Diagnosis present

## 2023-07-28 DIAGNOSIS — R8271 Bacteriuria: Secondary | ICD-10-CM

## 2023-07-28 DIAGNOSIS — E785 Hyperlipidemia, unspecified: Secondary | ICD-10-CM | POA: Diagnosis not present

## 2023-07-28 DIAGNOSIS — D631 Anemia in chronic kidney disease: Secondary | ICD-10-CM | POA: Diagnosis not present

## 2023-07-28 DIAGNOSIS — I69354 Hemiplegia and hemiparesis following cerebral infarction affecting left non-dominant side: Secondary | ICD-10-CM

## 2023-07-28 DIAGNOSIS — D6862 Lupus anticoagulant syndrome: Secondary | ICD-10-CM | POA: Diagnosis present

## 2023-07-28 DIAGNOSIS — D649 Anemia, unspecified: Secondary | ICD-10-CM | POA: Diagnosis not present

## 2023-07-28 DIAGNOSIS — G4733 Obstructive sleep apnea (adult) (pediatric): Secondary | ICD-10-CM | POA: Diagnosis not present

## 2023-07-28 DIAGNOSIS — T83511A Infection and inflammatory reaction due to indwelling urethral catheter, initial encounter: Secondary | ICD-10-CM | POA: Diagnosis not present

## 2023-07-28 DIAGNOSIS — N390 Urinary tract infection, site not specified: Principal | ICD-10-CM

## 2023-07-28 DIAGNOSIS — I2510 Atherosclerotic heart disease of native coronary artery without angina pectoris: Secondary | ICD-10-CM | POA: Diagnosis not present

## 2023-07-28 DIAGNOSIS — N179 Acute kidney failure, unspecified: Secondary | ICD-10-CM | POA: Diagnosis not present

## 2023-07-28 DIAGNOSIS — Z9861 Coronary angioplasty status: Secondary | ICD-10-CM

## 2023-07-28 DIAGNOSIS — Z7901 Long term (current) use of anticoagulants: Secondary | ICD-10-CM

## 2023-07-28 DIAGNOSIS — Z66 Do not resuscitate: Secondary | ICD-10-CM | POA: Diagnosis not present

## 2023-07-28 DIAGNOSIS — E876 Hypokalemia: Secondary | ICD-10-CM | POA: Diagnosis not present

## 2023-07-28 DIAGNOSIS — F32A Depression, unspecified: Secondary | ICD-10-CM

## 2023-07-28 DIAGNOSIS — I959 Hypotension, unspecified: Secondary | ICD-10-CM | POA: Diagnosis not present

## 2023-07-28 DIAGNOSIS — I129 Hypertensive chronic kidney disease with stage 1 through stage 4 chronic kidney disease, or unspecified chronic kidney disease: Secondary | ICD-10-CM | POA: Diagnosis not present

## 2023-07-28 DIAGNOSIS — Z91014 Allergy to mammalian meats: Secondary | ICD-10-CM | POA: Diagnosis not present

## 2023-07-28 DIAGNOSIS — G40909 Epilepsy, unspecified, not intractable, without status epilepticus: Secondary | ICD-10-CM | POA: Diagnosis not present

## 2023-07-28 DIAGNOSIS — R6520 Severe sepsis without septic shock: Secondary | ICD-10-CM | POA: Diagnosis not present

## 2023-07-28 DIAGNOSIS — R140 Abdominal distension (gaseous): Secondary | ICD-10-CM | POA: Diagnosis present

## 2023-07-28 DIAGNOSIS — Z86718 Personal history of other venous thrombosis and embolism: Secondary | ICD-10-CM | POA: Diagnosis not present

## 2023-07-28 DIAGNOSIS — I82401 Acute embolism and thrombosis of unspecified deep veins of right lower extremity: Secondary | ICD-10-CM | POA: Diagnosis not present

## 2023-07-28 DIAGNOSIS — R569 Unspecified convulsions: Secondary | ICD-10-CM | POA: Diagnosis not present

## 2023-07-28 DIAGNOSIS — R4587 Impulsiveness: Secondary | ICD-10-CM | POA: Diagnosis present

## 2023-07-28 DIAGNOSIS — R509 Fever, unspecified: Secondary | ICD-10-CM | POA: Diagnosis not present

## 2023-07-28 DIAGNOSIS — F419 Anxiety disorder, unspecified: Secondary | ICD-10-CM | POA: Diagnosis not present

## 2023-07-28 DIAGNOSIS — A4151 Sepsis due to Escherichia coli [E. coli]: Secondary | ICD-10-CM | POA: Diagnosis not present

## 2023-07-28 DIAGNOSIS — N319 Neuromuscular dysfunction of bladder, unspecified: Secondary | ICD-10-CM | POA: Diagnosis present

## 2023-07-28 DIAGNOSIS — N1831 Chronic kidney disease, stage 3a: Secondary | ICD-10-CM | POA: Diagnosis not present

## 2023-07-28 DIAGNOSIS — G629 Polyneuropathy, unspecified: Secondary | ICD-10-CM | POA: Diagnosis not present

## 2023-07-28 DIAGNOSIS — Z888 Allergy status to other drugs, medicaments and biological substances status: Secondary | ICD-10-CM

## 2023-07-28 LAB — CBC WITH DIFFERENTIAL/PLATELET
Abs Immature Granulocytes: 0.38 10*3/uL — ABNORMAL HIGH (ref 0.00–0.07)
Basophils Absolute: 0.1 10*3/uL (ref 0.0–0.1)
Basophils Relative: 0 %
Eosinophils Absolute: 0.1 10*3/uL (ref 0.0–0.5)
Eosinophils Relative: 0 %
HCT: 38.5 % — ABNORMAL LOW (ref 39.0–52.0)
Hemoglobin: 12 g/dL — ABNORMAL LOW (ref 13.0–17.0)
Immature Granulocytes: 2 %
Lymphocytes Relative: 17 %
Lymphs Abs: 4.2 10*3/uL — ABNORMAL HIGH (ref 0.7–4.0)
MCH: 29.7 pg (ref 26.0–34.0)
MCHC: 31.2 g/dL (ref 30.0–36.0)
MCV: 95.3 fL (ref 80.0–100.0)
Monocytes Absolute: 2.6 10*3/uL — ABNORMAL HIGH (ref 0.1–1.0)
Monocytes Relative: 11 %
Neutro Abs: 16.6 10*3/uL — ABNORMAL HIGH (ref 1.7–7.7)
Neutrophils Relative %: 70 %
Platelets: 133 10*3/uL — ABNORMAL LOW (ref 150–400)
RBC: 4.04 MIL/uL — ABNORMAL LOW (ref 4.22–5.81)
RDW: 17.2 % — ABNORMAL HIGH (ref 11.5–15.5)
WBC: 23.9 10*3/uL — ABNORMAL HIGH (ref 4.0–10.5)
nRBC: 0 % (ref 0.0–0.2)

## 2023-07-28 LAB — COMPREHENSIVE METABOLIC PANEL WITH GFR
ALT: 31 U/L (ref 0–44)
AST: 35 U/L (ref 15–41)
Albumin: 3.6 g/dL (ref 3.5–5.0)
Alkaline Phosphatase: 68 U/L (ref 38–126)
Anion gap: 9 (ref 5–15)
BUN: 29 mg/dL — ABNORMAL HIGH (ref 8–23)
CO2: 22 mmol/L (ref 22–32)
Calcium: 9.3 mg/dL (ref 8.9–10.3)
Chloride: 105 mmol/L (ref 98–111)
Creatinine, Ser: 1.68 mg/dL — ABNORMAL HIGH (ref 0.61–1.24)
GFR, Estimated: 41 mL/min — ABNORMAL LOW (ref >60)
Glucose, Bld: 107 mg/dL — ABNORMAL HIGH (ref 70–99)
Potassium: 4.2 mmol/L (ref 3.5–5.1)
Sodium: 136 mmol/L (ref 135–145)
Total Bilirubin: 0.4 mg/dL (ref 0.0–1.2)
Total Protein: 7.9 g/dL (ref 6.5–8.1)

## 2023-07-28 LAB — URINALYSIS, W/ REFLEX TO CULTURE (INFECTION SUSPECTED)
Bilirubin Urine: NEGATIVE
Glucose, UA: NEGATIVE mg/dL
Hgb urine dipstick: NEGATIVE
Ketones, ur: NEGATIVE mg/dL
Nitrite: NEGATIVE
Protein, ur: NEGATIVE mg/dL
Specific Gravity, Urine: 1.019 (ref 1.005–1.030)
WBC, UA: >50 WBC/hpf (ref 0–5)
pH: 5 (ref 5.0–8.0)

## 2023-07-28 LAB — HEPARIN LEVEL (UNFRACTIONATED): Heparin Unfractionated: >1.1 [IU]/mL — ABNORMAL HIGH (ref 0.30–0.70)

## 2023-07-28 LAB — TROPONIN I (HIGH SENSITIVITY): Troponin I (High Sensitivity): 9 ng/L (ref <18)

## 2023-07-28 LAB — PROTIME-INR
INR: 1.8 — ABNORMAL HIGH (ref 0.8–1.2)
Prothrombin Time: 20.8 s — ABNORMAL HIGH (ref 11.4–15.2)

## 2023-07-28 LAB — I-STAT CG4 LACTIC ACID, ED: Lactic Acid, Venous: 1.4 mmol/L (ref 0.5–1.9)

## 2023-07-28 LAB — APTT: aPTT: 47 s — ABNORMAL HIGH (ref 24–36)

## 2023-07-28 MED ORDER — CEFPODOXIME PROXETIL 200 MG PO TABS: 200.0000 mg | ORAL_TABLET | Freq: Two times a day (BID) | ORAL | 0 refills | Status: DC

## 2023-07-28 MED ORDER — SODIUM CHLORIDE 0.9% FLUSH
3.0000 mL | Freq: Two times a day (BID) | INTRAVENOUS | Status: DC
  Administered 2023-07-28 – 2023-08-06 (×18): 3 mL via INTRAVENOUS

## 2023-07-28 MED ORDER — SODIUM CHLORIDE 0.9 % IV SOLN: INTRAVENOUS | Status: DC

## 2023-07-28 MED ORDER — SODIUM CHLORIDE 0.9 % IV SOLN
1.0000 g | Freq: Once | INTRAVENOUS | Status: AC
  Administered 2023-07-28: 1 g via INTRAVENOUS
  Filled 2023-07-28: qty 10

## 2023-07-28 MED ORDER — SODIUM CHLORIDE 0.9 % IV SOLN
1.0000 g | INTRAVENOUS | Status: DC
  Administered 2023-07-29: 1 g via INTRAVENOUS
  Filled 2023-07-28: qty 10

## 2023-07-28 MED ORDER — LACTATED RINGERS IV SOLN: INTRAVENOUS | Status: AC

## 2023-07-28 MED ORDER — GABAPENTIN 100 MG PO CAPS
100.0000 mg | ORAL_CAPSULE | Freq: Three times a day (TID) | ORAL | Status: DC
  Administered 2023-07-29 – 2023-08-06 (×25): 100 mg via ORAL
  Filled 2023-07-28 (×25): qty 1

## 2023-07-28 MED ORDER — ACETAMINOPHEN 650 MG RE SUPP: 650.0000 mg | Freq: Four times a day (QID) | RECTAL | Status: DC | PRN

## 2023-07-28 MED ORDER — BUPROPION HCL ER (XL) 150 MG PO TB24
150.0000 mg | ORAL_TABLET | Freq: Every day | ORAL | Status: DC
  Administered 2023-07-29 – 2023-08-06 (×9): 150 mg via ORAL
  Filled 2023-07-28 (×9): qty 1

## 2023-07-28 MED ORDER — LACTATED RINGERS IV BOLUS
1000.0000 mL | Freq: Once | INTRAVENOUS | Status: AC
  Administered 2023-07-28: 1000 mL via INTRAVENOUS

## 2023-07-28 MED ORDER — EYE WASH OP SOLN: 2.0000 [drp] | OPHTHALMIC | Status: DC | PRN

## 2023-07-28 MED ORDER — ASPIRIN 81 MG PO CHEW
81.0000 mg | CHEWABLE_TABLET | Freq: Every day | ORAL | Status: DC
  Administered 2023-07-28 – 2023-07-29 (×2): 81 mg via ORAL
  Filled 2023-07-28 (×3): qty 1

## 2023-07-28 MED ORDER — BACLOFEN 10 MG PO TABS
10.0000 mg | ORAL_TABLET | Freq: Two times a day (BID) | ORAL | Status: DC
  Administered 2023-07-28 – 2023-08-06 (×18): 10 mg via ORAL
  Filled 2023-07-28 (×18): qty 1

## 2023-07-28 MED ORDER — ACETAMINOPHEN 500 MG PO TABS
1000.0000 mg | ORAL_TABLET | Freq: Once | ORAL | Status: AC
  Administered 2023-07-28: 1000 mg via ORAL
  Filled 2023-07-28: qty 2

## 2023-07-28 MED ORDER — LEVETIRACETAM 500 MG PO TABS
1500.0000 mg | ORAL_TABLET | Freq: Two times a day (BID) | ORAL | Status: DC
  Administered 2023-07-29 – 2023-08-06 (×17): 1500 mg via ORAL
  Filled 2023-07-28 (×17): qty 3

## 2023-07-28 MED ORDER — ROSUVASTATIN CALCIUM 20 MG PO TABS
40.0000 mg | ORAL_TABLET | Freq: Every day | ORAL | Status: DC
  Administered 2023-07-28 – 2023-08-05 (×9): 40 mg via ORAL
  Filled 2023-07-28 (×9): qty 2

## 2023-07-28 MED ORDER — MELATONIN 5 MG PO TABS
5.0000 mg | ORAL_TABLET | Freq: Every day | ORAL | Status: DC
  Administered 2023-07-28 – 2023-08-05 (×9): 5 mg via ORAL
  Filled 2023-07-28 (×9): qty 1

## 2023-07-28 MED ORDER — HEPARIN (PORCINE) 25000 UT/250ML-% IV SOLN
1450.0000 [IU]/h | INTRAVENOUS | Status: DC
Start: 2023-07-28 — End: 2023-07-29
  Administered 2023-07-28: 1450 [IU]/h via INTRAVENOUS
  Filled 2023-07-28: qty 250

## 2023-07-28 MED ORDER — POLYVINYL ALCOHOL 1.4 % OP SOLN
1.0000 [drp] | OPHTHALMIC | Status: DC | PRN
Start: 2023-07-28 — End: 2023-08-06

## 2023-07-28 MED ORDER — SERTRALINE HCL 50 MG PO TABS
100.0000 mg | ORAL_TABLET | Freq: Every day | ORAL | Status: DC
  Administered 2023-07-28 – 2023-08-06 (×10): 100 mg via ORAL
  Filled 2023-07-28 (×10): qty 2

## 2023-07-28 MED ORDER — POLYETHYLENE GLYCOL 3350 17 G PO PACK: 17.0000 g | PACK | Freq: Every day | ORAL | Status: DC | PRN

## 2023-07-28 MED ORDER — ACETAMINOPHEN 325 MG PO TABS: 650.0000 mg | ORAL_TABLET | Freq: Four times a day (QID) | ORAL | Status: DC | PRN

## 2023-07-28 MED ORDER — PANTOPRAZOLE SODIUM 40 MG PO TBEC
40.0000 mg | DELAYED_RELEASE_TABLET | Freq: Every day | ORAL | Status: DC
  Administered 2023-07-28 – 2023-08-06 (×10): 40 mg via ORAL
  Filled 2023-07-28 (×10): qty 1

## 2023-07-28 NOTE — ED Provider Notes (Addendum)
  EMERGENCY DEPARTMENT AT [facility] Provider Note   CSN: [id number] Arrival date & time: 07/28/23  1057     History  Chief Complaint  Patient presents with   Fever   Urinary Tract Infection    Mark [name] is a 78 y.o. male.   Fever Urinary Tract Infection Associated symptoms: fever      78 year old male with medical history significant for CAD, HTN, ITP, lupus anticoagulant disorder, CVA, seizures, neurogenic bladder who self caths at home presenting to the emergency department with fever and concern for UTI.  The patient self caths at home due to neurogenic bladder.  Last night he spiked a fever to 101.  His urine has been more purulent and foul-smelling recently.  He is concerned for urinary tract infection.  He denies abdominal pain, nausea or vomiting, no known sick contacts.  He is tolerating oral intake.  Home Medications Prior to Admission medications   Medication Sig Start Date End Date Taking? Authorizing Provider  amLODipine (NORVASC) 10 MG tablet Take 10 mg by mouth at bedtime. 02/16/23  Yes [provider]  aspirin EC 81 MG tablet Take 81 mg by mouth daily. Swallow whole.   Yes [provider]  baclofen (LIORESAL) 10 MG tablet Take 10 mg by mouth in the morning and at bedtime.   Yes [provider]  buPROPion (WELLBUTRIN XL) 150 MG 24 hr tablet Take 150 mg by mouth in the morning. 12/19/14  Yes [provider]  Cholecalciferol (VITAMIN D3) 50 MCG (2000 UT) TABS Take 2,000 Units by mouth at bedtime.   Yes [provider]  fluticasone (FLONASE) 50 MCG/ACT nasal spray Place 2 sprays into both nostrils daily as needed for allergies or rhinitis. 07/09/23  Yes Standley Brooking, MD  gabapentin (NEURONTIN) 100 MG capsule Take 1 capsule (100 mg total) by mouth 3 (three) times daily. 07/21/23  Yes Love, Evlyn Kanner, PA-C  levETIRAcetam (KEPPRA) 500 MG tablet Take 1,500 mg by mouth in the morning and at bedtime.  12/24/11  Yes [provider]  melatonin 5 MG TABS Take 1 tablet (5 mg total) by mouth at bedtime as needed. 07/21/23  Yes Love, Evlyn Kanner, PA-C  nystatin (MYCOSTATIN) 100000 UNIT/ML suspension Take 5 mLs (500,000 Units total) by mouth 4 (four) times daily. 07/21/23  Yes Love, Evlyn Kanner, PA-C  omeprazole (PRILOSEC) 40 MG capsule Take 40 mg by mouth daily before breakfast. 08/01/19  Yes [provider]  polyethylene glycol (MIRALAX / GLYCOLAX) packet Take 17 g by mouth daily as needed. 10/25/16  Yes Zannie Cove, MD  rivaroxaban (XARELTO) 20 MG TABS tablet Take 1 tablet (20 mg total) by mouth daily with supper. 07/21/23  Yes Love, Evlyn Kanner, PA-C  rosuvastatin (CRESTOR) 40 MG tablet Take 40 mg by mouth at bedtime.   Yes [provider]  sertraline (ZOLOFT) 100 MG tablet Take 200 mg by mouth in the morning.    Yes [provider]  THERATEARS 0.25 % SOLN Place 1 drop into both eyes 2 (two) times daily as needed (for redness or irritation).   Yes [provider]  TYLENOL 500 MG tablet Take 500 mg by mouth See admin instructions. Take 500 mg by mouth at bedtime and an additional 500 mg once a day as needed for pain or headaches   Yes [provider]      Allergies    Diphenhydramine, Famotidine, Beef-derived drug products, Chicken protein, and Pork-derived products  Review of Systems   Review of Systems  Constitutional:  Positive for fever.  All other systems reviewed and are negative.   Physical Exam Updated Vital Signs BP 114/65 (BP Location: Right Arm)   Pulse 73   Temp 99.7 F (37.6 C) (Oral)   Resp 17   Ht 5\' 11"  (1.803 m)   Wt 90.6 kg   SpO2 93%   BMI 27.86 kg/m  Physical Exam Vitals and nursing note reviewed.  Constitutional:      General: He is not in acute distress.    Appearance: He is well-developed.  HENT:     Head: Normocephalic and atraumatic.  Eyes:     Conjunctiva/sclera: Conjunctivae normal.  Cardiovascular:      Rate and Rhythm: Normal rate and regular rhythm.  Pulmonary:     Effort: Pulmonary effort is normal. No respiratory distress.     Breath sounds: Normal breath sounds.  Abdominal:     Palpations: Abdomen is soft.     Tenderness: There is no abdominal tenderness.  Musculoskeletal:        General: No swelling.     Cervical back: Neck supple.  Skin:    General: Skin is warm and dry.     Capillary Refill: Capillary refill takes less than 2 seconds.  Neurological:     Mental Status: He is alert.  Psychiatric:        Mood and Affect: Mood normal.     ED Results / Procedures / Treatments   Labs (all labs ordered are listed, but only abnormal results are displayed) Labs Reviewed  COMPREHENSIVE METABOLIC PANEL WITH GFR - Abnormal; Notable for the following components:      Result Value   Glucose, Bld 107 (*)    BUN 29 (*)    Creatinine, Ser 1.68 (*)    GFR, Estimated 41 (*)    All other components within normal limits  CBC WITH DIFFERENTIAL/PLATELET - Abnormal; Notable for the following components:   WBC 23.9 (*)    RBC 4.04 (*)    Hemoglobin 12.0 (*)    HCT 38.5 (*)    RDW 17.2 (*)    Platelets 133 (*)    Neutro Abs 16.6 (*)    Lymphs Abs 4.2 (*)    Monocytes Absolute 2.6 (*)    Abs Immature Granulocytes 0.38 (*)    All other components within normal limits  URINALYSIS, W/ REFLEX TO CULTURE (INFECTION SUSPECTED) - Abnormal; Notable for the following components:   APPearance HAZY (*)    Leukocytes,Ua MODERATE (*)    Bacteria, UA MANY (*)    All other components within normal limits  PROTIME-INR - Abnormal; Notable for the following components:   Prothrombin Time 20.8 (*)    INR 1.8 (*)    All other components within normal limits  APTT - Abnormal; Notable for the following components:   aPTT 47 (*)    All other components within normal limits  HEPARIN LEVEL (UNFRACTIONATED) - Abnormal; Notable for the following components:   Heparin Unfractionated >1.10 (*)    All other  components within normal limits  CULTURE, BLOOD (ROUTINE X 2)  CULTURE, BLOOD (ROUTINE X 2)  URINE CULTURE  PROTIME-INR  BASIC METABOLIC PANEL WITH GFR  APTT  HEPARIN LEVEL (UNFRACTIONATED)  CBC  I-STAT CG4 LACTIC ACID, ED  TROPONIN I (HIGH SENSITIVITY)    EKG None  Radiology DG Chest Port 1 View Result Date: 07/28/2023 CLINICAL DATA:  Left-sided paralysis, fever. Pyuria. Possible sepsis.  EXAM: PORTABLE CHEST 1 VIEW COMPARISON:  07/15/2023 FINDINGS: The patient is rotated to the right on today's radiograph, reducing diagnostic sensitivity and specificity. Low lung volumes are present, causing crowding of the pulmonary vasculature. Indistinct pulmonary vasculature, cannot exclude pulmonary venous hypertension although no overt cardiomegaly is observed. Chronic right lower rib deformities likely from old healed fractures. Vertebral augmentations at T12 and L1. No overt consolidation/airspace opacity identified. No blunting of the costophrenic angles. IMPRESSION: 1. Low lung volumes are present, causing crowding of the pulmonary vasculature. 2. Indistinct pulmonary vasculature, cannot exclude pulmonary venous hypertension although no overt cardiomegaly is observed. 3. Chronic right lower rib deformities likely from old healed fractures. 4. Vertebral augmentations at T12 and L1. Electronically Signed   By: Gaylyn Rong M.D.   On: 07/28/2023 14:22    Procedures .Critical Care  Performed by: Ernie Avena, MD Authorized by: Ernie Avena, MD   Critical care provider statement:    Critical care time (minutes):  30   Critical care was necessary to treat or prevent imminent or life-threatening deterioration of the following conditions:  Sepsis   Critical care was time spent personally by me on the following activities:  Development of treatment plan with patient or surrogate, discussions with consultants, evaluation of patient's response to treatment, examination of patient, ordering and  review of laboratory studies, ordering and review of radiographic studies, ordering and performing treatments and interventions, pulse oximetry, re-evaluation of patient's condition and review of old charts   Care discussed with: admitting provider       Medications Ordered in ED Medications  lactated ringers infusion ( Intravenous Infusion Verify 07/28/23 1822)  acetaminophen (TYLENOL) tablet 650 mg (has no administration in time range)    Or  acetaminophen (TYLENOL) suppository 650 mg (has no administration in time range)  polyethylene glycol (MIRALAX / GLYCOLAX) packet 17 g (has no administration in time range)  sodium chloride flush (NS) 0.9 % injection 3 mL (has no administration in time range)  cefTRIAXone (ROCEPHIN) 1 g in sodium chloride 0.9 % 100 mL IVPB (has no administration in time range)  aspirin chewable tablet 81 mg (has no administration in time range)  baclofen (LIORESAL) tablet 10 mg (has no administration in time range)  buPROPion (WELLBUTRIN XL) 24 hr tablet 150 mg (has no administration in time range)  gabapentin (NEURONTIN) capsule 100 mg (has no administration in time range)  levETIRAcetam (KEPPRA) tablet 1,500 mg (1,500 mg Oral Not Given 07/28/23 2036)  melatonin tablet 5 mg (has no administration in time range)  pantoprazole (PROTONIX) EC tablet 40 mg (has no administration in time range)  rosuvastatin (CRESTOR) tablet 40 mg (has no administration in time range)  sertraline (ZOLOFT) tablet 100 mg (has no administration in time range)  polyvinyl alcohol (LIQUIFILM TEARS) 1.4 % ophthalmic solution 1 drop (has no administration in time range)  heparin ADULT infusion 100 units/mL (25000 units/242mL) (has no administration in time range)  acetaminophen (TYLENOL) tablet 1,000 mg (1,000 mg Oral Given 07/28/23 1318)  lactated ringers bolus 1,000 mL (0 mLs Intravenous Stopped 07/28/23 1617)  cefTRIAXone (ROCEPHIN) 1 g in sodium chloride 0.9 % 100 mL IVPB (0 g Intravenous  Stopped 07/28/23 1617)    ED Course/ Medical Decision Making/ A&P Clinical Course as of 07/28/23 2040  Mon Jul 28, 2023  1444 Glori LuisMarland Kitchen): MODERATE [JL]  1444 WBC, UA: >50 [JL]  1444 Bacteria, UA(!): MANY [JL]    Clinical Course User Index [JL] Ernie Avena, MD  Medical Decision Making Amount and/or Complexity of Data Reviewed Labs:  Decision-making details documented in ED Course.  Risk OTC drugs. Prescription drug management. Decision regarding hospitalization.     78 year old male with medical history significant for CAD, HTN, ITP, lupus anticoagulant disorder, CVA, seizures, neurogenic bladder who self caths at home presenting to the emergency department with fever and concern for UTI.  The patient self caths at home due to neurogenic bladder.  Last night he spiked a fever to 101.  His urine has been more purulent and foul-smelling recently.  He is concerned for urinary tract infection.  He denies abdominal pain, nausea or vomiting, no known sick contacts.  He is tolerating oral intake.  On arrival, the patient was afebrile, temperature 100, not tachycardic heart rate 93, not tachypneic RR 15, BP hypotensive 87/63, saturating 96% on room air.  Concern for sepsis from a UTI, initial workup initiated to include blood cultures and urinalysis and urine culture in addition to screening labs.  The patient was administered an IV fluid bolus for volume resuscitation with good improvement in his blood pressure.  Laboratory evaluation revealed CBC with a leukocytosis to 23.9, urinalysis with evidence of UTI with moderate leukocytes, greater than 50 WBCs and many bacteria present.  CBC with an AKI on CKD with a creatinine of 1.68 from baseline of around 1.3.  Given the leukocytosis, fever, elevated heart rate, code sepsis was initiated.  Blood cultures were collected in addition to urine culture prior to antibiotics.  Her lactic acid was 1.4.  Patient  appeared improved hemodynamically from the volume resuscitation standpoint after 1 L LR bolus with heart rates down to the 70s and blood pressure up to 122/70 on recheck.  Patient was covered with broad-spectrum antibiotics to include IV Rocephin.  Given the patient's SIRS presentation, initial presentation of hypotension, recommended admission for observation which the patient was amenable to, hospitalist medicine consulted for admission, Dr. Cleda Curly admitting.   Final Clinical Impression(s) / ED Diagnoses Final diagnoses:  Lower urinary tract infectious disease  Hypotension, unspecified hypotension type  Sepsis, due to unspecified organism, unspecified whether acute organ dysfunction present (HCC)  AKI (acute kidney injury) (HCC)    Rx / DC Orders ED Discharge Orders          Ordered    cefpodoxime (VANTIN) 200 MG tablet  2 times daily,   Status:  Discontinued        07/28/23 1445              Rosealee Concha, MD 07/28/23 2039    Rosealee Concha, MD 07/28/23 2040

## 2023-07-28 NOTE — ED Triage Notes (Signed)
 Pt arrived with caregiver. Hx of left side paralysis. Pt had 101 temp with pus in urine since 0200 while attempting self cath. Denies any other new symptoms.

## 2023-07-28 NOTE — Discharge Instructions (Addendum)
 Mark [name],  You were in the hospital with a urinary tract infection. This has resolved with antibiotics. You will be discharged to physical rehabilitation to get stronger.

## 2023-07-28 NOTE — Assessment & Plan Note (Signed)
 C/w aspirin, statin

## 2023-07-28 NOTE — Assessment & Plan Note (Signed)
 C.w. wellbutrin and zoloft

## 2023-07-28 NOTE — Assessment & Plan Note (Signed)
 Given the entirety of clinical picture this is felt to be prerenal on CKD.  Patient getting 150 cc/h of LR infusion and also got a liter earlier.  We will trend creatinine.  Given chronic Neurolite genic bladder, which requires frequent self catheterizations, I will maintain the patient on Foley at this time.

## 2023-07-28 NOTE — Sepsis Progress Note (Signed)
 Code Sepsis protocol being monitored by [employer].

## 2023-07-28 NOTE — H&P (Signed)
 History and Physical    Patient: Mark [name] GNF:[id number] DOB: Dec 25, 1945 DOA: 07/28/2023 DOS: the patient was seen and examined on 07/28/2023 PCP: Victorio Grave, MD  Patient coming from: Home  Chief Complaint:  Chief Complaint  Patient presents with   Fever   Urinary Tract Infection   HPI: Mark [name] is a 78 y.o. male with medical history significant of prior DVT, prior seizure episodes as well as stroke with chronic left hemiparesis.  With rather complete paralysis of the left upper extremity.  Patient further self catheterizes chronically at home due to neurogenic bladder.  History is obtained from the patient, daughter at the bedside.  Patient seems to have been in his usual state of health all day yesterday.  When patient was being toileted at approximately 2:45 AM this morning, patient was noted to be shivering and extremely hard as per wife.  Patient's temperature at the time was 101 F.  Also patient's urine was noted to be foul-smelling/cloudy.  Earlier this morning, while getting out of bed, patient slid out of bed.  No trauma is reported.  Patient required the fire department to come and help him sit back into the bed.  There is no report of coughing trouble breathing chest pain vomiting diarrhea rash and skin.  Final decision was made to bring the patient to the ER given the marked change in functional status as above.  On arrival to the ER, patient's blood pressure recorded as 87/63.  Patient is s/p 1 L LR as well as IV ceftriaxone.  White count is 23K.  Patient is felt to have sepsis from urologic origin.  Medical evaluation is sought. Review of Systems: As mentioned in the history of present illness. All other systems reviewed and are negative. 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    Lupus anticoagulant disorder (HCC)    Lupus anticoagulant syndrome (HCC)    Multiple closed anterior-posterior compression fractures of pelvis (HCC)    Seizures (HCC)    last seizure 10 years ago approx    Sleep apnea    cpap broken x 1 year per wife    Stroke (HCC) 02/27/2011   Left side weakness   Past Surgical History:  Procedure Laterality Date   blood clot Right    surgical removal   CHOLECYSTECTOMY     CORONARY ANGIOPLASTY  1992   CYSTOGRAM N/A 02/17/2020   Procedure: CYSTOGRAM;  Surgeon: Andrez Banker, MD;  Location: [facility];  Service: Urology;  Laterality: N/A;   CYSTOSCOPY WITH RETROGRADE PYELOGRAM, URETEROSCOPY AND STENT PLACEMENT Bilateral 02/17/2020   Procedure: Deliliah Fender  URETEROSCOPY AND STENT PLACEMENT;  Surgeon: Andrez Banker, MD;  Location: [facility];  Service: Urology;  Laterality: Bilateral;   SPLENECTOMY, TOTAL     vertebralplasty     Social History:  reports that he quit smoking about 38 years ago. His smoking use included cigarettes. He started smoking about 55 years ago. He has a 16.6 pack-year smoking history. He has never used smokeless tobacco. He reports that he  does not currently use drugs. He reports that he does not drink alcohol.  Allergies  Allergen Reactions   Diphenhydramine Other (See Comments)    Affects kidneys   Famotidine Other (See Comments)    Affects kidneys   Other Other (See Comments)    Patient is a VEGETARIAN  Patient is a diabetic.   Beef-Derived Drug Products    Chicken Protein    Pork-Derived Products     History reviewed. No pertinent family history.  Prior to Admission medications   Medication Sig Start Date End Date Taking? Authorizing Provider  cefpodoxime (VANTIN) 200 MG tablet Take 1 tablet (200 mg total) by  mouth 2 (two) times daily for 7 days. 07/28/23 08/04/23 Yes Rosealee Concha, MD  amLODipine (NORVASC) 10 MG tablet Take 10 mg by mouth at bedtime. 02/16/23   [provider]  aspirin EC 81 MG tablet Take 81 mg by mouth daily. Swallow whole.    [provider]  baclofen (LIORESAL) 10 MG tablet Take 10 mg by mouth in the morning and at bedtime.    [provider]  buPROPion (WELLBUTRIN XL) 150 MG 24 hr tablet Take 150 mg by mouth in the morning. 12/19/14   [provider]  Cholecalciferol (VITAMIN D3) 50 MCG (2000 UT) TABS Take 2,000 Units by mouth at bedtime.    [provider]  fluticasone (FLONASE) 50 MCG/ACT nasal spray Place 2 sprays into both nostrils daily as needed for allergies or rhinitis. 07/09/23   Lonita Roach, MD  gabapentin (NEURONTIN) 100 MG capsule Take 1 capsule (100 mg total) by mouth 3 (three) times daily. 07/21/23   Love, Renay Carota, PA-C  levETIRAcetam (KEPPRA) 500 MG tablet Take 1,500 mg by mouth in the morning and at bedtime. 12/24/11   [provider]  melatonin 5 MG TABS Take 1 tablet (5 mg total) by mouth at bedtime as needed. 07/21/23   Love, Renay Carota, PA-C  nystatin (MYCOSTATIN) 100000 UNIT/ML suspension Take 5 mLs (500,000 Units total) by mouth 4 (four) times daily. 07/21/23   Love, Renay Carota, PA-C  omeprazole (PRILOSEC) 40 MG capsule Take 40 mg by mouth daily before breakfast. 08/01/19   [provider]  polyethylene glycol (MIRALAX / GLYCOLAX) packet Take 17 g by mouth daily as needed. 10/25/16   Deforest Fast, MD  rivaroxaban (XARELTO) 20 MG TABS tablet Take 1 tablet (20 mg total) by mouth daily with supper. 07/21/23   Love, Renay Carota, PA-C  rosuvastatin (CRESTOR) 40 MG tablet Take 40 mg by mouth at bedtime.    [provider]  sertraline (ZOLOFT) 100 MG tablet Take 200 mg by mouth in the morning.     [provider]  THERATEARS 0.25 % SOLN Place 1 drop into both eyes 2 (two) times daily as needed (for  redness or irritation).    [provider]  TYLENOL 500 MG tablet Take 500 mg by mouth See admin instructions. Take 500 mg by mouth at bedtime and an additional 500 mg once a day as needed for pain or headaches    [provider]    Physical Exam: Vitals:   07/28/23 1130 07/28/23 1200 07/28/23 1515 07/28/23 1537  BP:  109/73 122/70   Pulse:  80 79   Resp:  (!) 21 18   Temp:    98.5 F (36.9 C)  TempSrc:    Oral  SpO2:  97% 94%   Weight: 90.6 kg     Height: 5\' 11"  (1.803  m)      General: Patient is alert and awake, gives a coherent account of his symptoms as above.  Appears to be in no distress, daughter at the bedside Respiratory exam: Bilateral intravesicular Cardiovascular exam S1-S2 normal Abdomen all quadrants are soft nontender Extremities warm without edema left hemiparesis with complete paralysis of the left upper extremity.  External genitalia appears healthy. Data Reviewed:  Labs on Admission:  Results for orders placed or performed during the hospital encounter of 07/28/23 (from the past 24 hours)  Comprehensive metabolic panel     Status: Abnormal   Collection Time: 07/28/23 11:52 AM  Result Value Ref Range   Sodium 136 135 - 145 mmol/L   Potassium 4.2 3.5 - 5.1 mmol/L   Chloride 105 98 - 111 mmol/L   CO2 22 22 - 32 mmol/L   Glucose, Bld 107 (H) 70 - 99 mg/dL   BUN 29 (H) 8 - 23 mg/dL   Creatinine, Ser 4.40 (H) 0.61 - 1.24 mg/dL   Calcium 9.3 8.9 - 34.7 mg/dL   Total Protein 7.9 6.5 - 8.1 g/dL   Albumin 3.6 3.5 - 5.0 g/dL   AST 35 15 - 41 U/L   ALT 31 0 - 44 U/L   Alkaline Phosphatase 68 38 - 126 U/L   Total Bilirubin 0.4 0.0 - 1.2 mg/dL   GFR, Estimated 41 (L) >60 mL/min   Anion gap 9 5 - 15  CBC with Differential     Status: Abnormal   Collection Time: 07/28/23 11:52 AM  Result Value Ref Range   WBC 23.9 (H) 4.0 - 10.5 K/uL   RBC 4.04 (L) 4.22 - 5.81 MIL/uL   Hemoglobin 12.0 (L) 13.0 - 17.0 g/dL   HCT 42.5 (L) 95.6 - 38.7 %   MCV 95.3  80.0 - 100.0 fL   MCH 29.7 26.0 - 34.0 pg   MCHC 31.2 30.0 - 36.0 g/dL   RDW 56.4 (H) 33.2 - 95.1 %   Platelets 133 (L) 150 - 400 K/uL   nRBC 0.0 0.0 - 0.2 %   Neutrophils Relative % 70 %   Neutro Abs 16.6 (H) 1.7 - 7.7 K/uL   Lymphocytes Relative 17 %   Lymphs Abs 4.2 (H) 0.7 - 4.0 K/uL   Monocytes Relative 11 %   Monocytes Absolute 2.6 (H) 0.1 - 1.0 K/uL   Eosinophils Relative 0 %   Eosinophils Absolute 0.1 0.0 - 0.5 K/uL   Basophils Relative 0 %   Basophils Absolute 0.1 0.0 - 0.1 K/uL   Immature Granulocytes 2 %   Abs Immature Granulocytes 0.38 (H) 0.00 - 0.07 K/uL  Protime-INR     Status: Abnormal   Collection Time: 07/28/23 12:19 PM  Result Value Ref Range   Prothrombin Time 20.8 (H) 11.4 - 15.2 seconds   INR 1.8 (H) 0.8 - 1.2  I-Stat Lactic Acid, ED     Status: None   Collection Time: 07/28/23  1:28 PM  Result Value Ref Range   Lactic Acid, Venous 1.4 0.5 - 1.9 mmol/L  Urinalysis, w/ Reflex to Culture (Infection Suspected) -Urine, Catheterized     Status: Abnormal   Collection Time: 07/28/23  1:35 PM  Result Value Ref Range   Specimen Source URINE, CATHETERIZED    Color, Urine YELLOW YELLOW   APPearance HAZY (A) CLEAR   Specific Gravity, Urine 1.019 1.005 - 1.030   pH 5.0 5.0 - 8.0   Glucose, UA NEGATIVE NEGATIVE mg/dL   Hgb urine dipstick  NEGATIVE NEGATIVE   Bilirubin Urine NEGATIVE NEGATIVE   Ketones, ur NEGATIVE NEGATIVE mg/dL   Protein, ur NEGATIVE NEGATIVE mg/dL   Nitrite NEGATIVE NEGATIVE   Leukocytes,Ua MODERATE (A) NEGATIVE   RBC / HPF 0-5 0 - 5 RBC/hpf   WBC, UA >50 0 - 5 WBC/hpf   Bacteria, UA MANY (A) NONE SEEN   Squamous Epithelial / HPF 0-5 0 - 5 /HPF   Mucus PRESENT    Hyaline Casts, UA PRESENT    *Note: Due to a large number of results and/or encounters for the requested time period, some results have not been displayed. A complete set of results can be found in Results Review.   Basic Metabolic Panel: Recent Labs  Lab 07/28/23 1152  NA 136   K 4.2  CL 105  CO2 22  GLUCOSE 107*  BUN 29*  CREATININE 1.68*  CALCIUM 9.3   Liver Function Tests: Recent Labs  Lab 07/28/23 1152  AST 35  ALT 31  ALKPHOS 68  BILITOT 0.4  PROT 7.9  ALBUMIN 3.6   No results for input(s): "LIPASE", "AMYLASE" in the last 168 hours. No results for input(s): "AMMONIA" in the last 168 hours. CBC: Recent Labs  Lab 07/28/23 1152  WBC 23.9*  NEUTROABS 16.6*  HGB 12.0*  HCT 38.5*  MCV 95.3  PLT 133*   Cardiac Enzymes: No results for input(s): "CKTOTAL", "CKMB", "CKMBINDEX", "TROPONINIHS" in the last 168 hours.  BNP (last 3 results) No results for input(s): "PROBNP" in the last 8760 hours. CBG: No results for input(s): "GLUCAP" in the last 168 hours.  Radiological Exams on Admission:  DG Chest Port 1 View Result Date: 07/28/2023 CLINICAL DATA:  Left-sided paralysis, fever. Pyuria. Possible sepsis. EXAM: PORTABLE CHEST 1 VIEW COMPARISON:  07/15/2023 FINDINGS: The patient is rotated to the right on today's radiograph, reducing diagnostic sensitivity and specificity. Low lung volumes are present, causing crowding of the pulmonary vasculature. Indistinct pulmonary vasculature, cannot exclude pulmonary venous hypertension although no overt cardiomegaly is observed. Chronic right lower rib deformities likely from old healed fractures. Vertebral augmentations at T12 and L1. No overt consolidation/airspace opacity identified. No blunting of the costophrenic angles. IMPRESSION: 1. Low lung volumes are present, causing crowding of the pulmonary vasculature. 2. Indistinct pulmonary vasculature, cannot exclude pulmonary venous hypertension although no overt cardiomegaly is observed. 3. Chronic right lower rib deformities likely from old healed fractures. 4. Vertebral augmentations at T12 and L1. Electronically Signed   By: Freida Jes M.D.   On: 07/28/2023 14:22    chest X-ray    No intake/output data recorded. No intake/output data  recorded.      Assessment and Plan: * Sepsis (HCC) Present on admission due to urologic origin.  Lactic acid within normal limit.  Will continue treatment with ceftriaxone.  Urine cultures already pending blood cultures pending.  Patient was having soft blood pressure on presentation, so hold all antihypertensive agent.  Fluids ordered.  Seizure (HCC) C/w keppra  GERD (gastroesophageal reflux disease) C/w omeprazeole  Depression C.w. wellbutrin and zoloft  Dyslipidemia C/w aspirin, statin  AKI (acute kidney injury) (HCC) Given the entirety of clinical picture this is felt to be prerenal on CKD.  Patient getting 150 cc/h of LR infusion and also got a liter earlier.  We will trend creatinine.  Given chronic Neurolite genic bladder, which requires frequent self catheterizations, I will maintain the patient on Foley at this time.  Deep vein thrombosis (HCC) Chronic Patient's platelets are showing a dramatic drop  down to 130 K today from over half  million, my concern is that the patient's platelet may be dropping and it is probably best to not start irreversiable a.c for now till we have a trend of this platelet. Known lupus anticoagulant syndrome. And ITP. Start heparin infusion.  Idiopathic thrombocytopenic purpura (HCC) This is a chronic diagnosis,Dr. Ennever agreed to help out in the AM   Home med rec pending pharmcy input . I ordered some meds based on last DC summary after discussion with aptietn and duaghter    Advance Care Planning:   Code Status: Limited: Do not attempt resuscitation (DNR) -DNR-LIMITED -Do Not Intubate/DNI patient wishes to be DNR/DNI.  This is consistent with his prior documented wishes   Consults: Hematology as above  Family Communication: Daughter at bedside, all questions answered.  Labs reviewed with patient and family  Severity of Illness: The appropriate patient status for this patient is INPATIENT. Inpatient status is judged to be reasonable  and necessary in order to provide the required intensity of service to ensure the patient's safety. The patient's presenting symptoms, physical exam findings, and initial radiographic and laboratory data in the context of their chronic comorbidities is felt to place them at high risk for further clinical deterioration. Furthermore, it is not anticipated that the patient will be medically stable for discharge from the hospital within 2 midnights of admission.   * I certify that at the point of admission it is my clinical judgment that the patient will require inpatient hospital care spanning beyond 2 midnights from the point of admission due to high intensity of service, high risk for further deterioration and high frequency of surveillance required.*  Author: Bennie Brave, MD 07/28/2023 5:17 PM  For on call review [email or web address].

## 2023-07-28 NOTE — Assessment & Plan Note (Addendum)
 This is a chronic diagnosis,Dr. Ennever agreed to help out in the AM

## 2023-07-28 NOTE — Assessment & Plan Note (Signed)
 Present on admission due to urologic origin.  Lactic acid within normal limit.  Will continue treatment with ceftriaxone.  Urine cultures already pending blood cultures pending.  Patient was having soft blood pressure on presentation, so hold all antihypertensive agent.  Fluids ordered.

## 2023-07-28 NOTE — Progress Notes (Signed)
 PHARMACY - ANTICOAGULATION CONSULT NOTE  Pharmacy Consult for heparin Indication:  history of DVT (Xarelto PTA)  Allergies  Allergen Reactions   Diphenhydramine Other (See Comments)    Affects kidneys   Famotidine Other (See Comments)    Affects kidneys   Beef-Derived Drug Products Other (See Comments)    Vegetarian   Chicken Protein Other (See Comments)    Vegetarian    Pork-Derived Products Other (See Comments)    Vegetarian     Patient Measurements: Height: 5\' 11"  (180.3 cm) Weight: 90.6 kg (199 lb 11.8 oz) IBW/kg (Calculated) : 75.3 HEPARIN DW (KG): 90.6  Vital Signs: Temp: 99.7 F (37.6 C) (04/14 1754) Temp Source: Oral (04/14 1754) BP: 114/65 (04/14 1754) Pulse Rate: 73 (04/14 1754)  Labs: Recent Labs    07/28/23 1152 07/28/23 1219  HGB 12.0*  --   HCT 38.5*  --   PLT 133*  --   LABPROT  --  20.8*  INR  --  1.8*  CREATININE 1.68*  --     Estimated Creatinine Clearance: 41.7 mL/min (A) (by C-G formula based on SCr of 1.68 mg/dL (H)).   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 Xarelto is being held on  admission, pharmacy consulted to dose heparin.   Today, 07/28/23 STAT baseline aPTT and heparin level ordered. HL >1.1 is falsely elevated due to recent DOAC CBC: Hgb (12), Plt (133) - both slightly low  Goal of Therapy:  Heparin level 0.3-0.7 units/ml aPTT 66-102 seconds Monitor platelets by anticoagulation protocol: Yes   Plan:  No bolus since compliant with rivaroxaban Initiate heparin drip 24 hours after last dose of rivaroxaban Heparin infusion of 1450 units/hr Check 8 hour aPTT    Daily CBC, heparin level/aPTT.  Once heparin level and aPTT correlate, can monitor using heparin level only Monitor for signs of bleeding  Shireen Dory, PharmD 07/28/2023,6:05 PM

## 2023-07-28 NOTE — Assessment & Plan Note (Signed)
 C/w omeprazeole

## 2023-07-28 NOTE — Assessment & Plan Note (Addendum)
 Chronic Patient's platelets are showing a dramatic drop down to 130 K today from over half  million, my concern is that the patient's platelet may be dropping and it is probably best to not start irreversiable a.c for now till we have a trend of this platelet. Known lupus anticoagulant syndrome. And ITP. Start heparin infusion.

## 2023-07-28 NOTE — Assessment & Plan Note (Signed)
C.w. keppra

## 2023-07-29 DIAGNOSIS — N179 Acute kidney failure, unspecified: Secondary | ICD-10-CM

## 2023-07-29 DIAGNOSIS — I82401 Acute embolism and thrombosis of unspecified deep veins of right lower extremity: Secondary | ICD-10-CM

## 2023-07-29 DIAGNOSIS — D693 Immune thrombocytopenic purpura: Secondary | ICD-10-CM

## 2023-07-29 DIAGNOSIS — A419 Sepsis, unspecified organism: Secondary | ICD-10-CM | POA: Diagnosis not present

## 2023-07-29 LAB — BASIC METABOLIC PANEL WITH GFR
Anion gap: 8 (ref 5–15)
BUN: 22 mg/dL (ref 8–23)
CO2: 23 mmol/L (ref 22–32)
Calcium: 8.7 mg/dL — ABNORMAL LOW (ref 8.9–10.3)
Chloride: 106 mmol/L (ref 98–111)
Creatinine, Ser: 1.37 mg/dL — ABNORMAL HIGH (ref 0.61–1.24)
GFR, Estimated: 53 mL/min — ABNORMAL LOW (ref >60)
Glucose, Bld: 96 mg/dL (ref 70–99)
Potassium: 4.1 mmol/L (ref 3.5–5.1)
Sodium: 137 mmol/L (ref 135–145)

## 2023-07-29 LAB — PROTIME-INR
INR: 1.3 — ABNORMAL HIGH (ref 0.8–1.2)
Prothrombin Time: 16 s — ABNORMAL HIGH (ref 11.4–15.2)

## 2023-07-29 LAB — CBC
HCT: 33 % — ABNORMAL LOW (ref 39.0–52.0)
Hemoglobin: 10.2 g/dL — ABNORMAL LOW (ref 13.0–17.0)
MCH: 29.7 pg (ref 26.0–34.0)
MCHC: 30.9 g/dL (ref 30.0–36.0)
MCV: 96.2 fL (ref 80.0–100.0)
Platelets: 92 10*3/uL — ABNORMAL LOW (ref 150–400)
RBC: 3.43 MIL/uL — ABNORMAL LOW (ref 4.22–5.81)
RDW: 17.2 % — ABNORMAL HIGH (ref 11.5–15.5)
WBC: 13.7 10*3/uL — ABNORMAL HIGH (ref 4.0–10.5)
nRBC: 0 % (ref 0.0–0.2)

## 2023-07-29 LAB — HEPARIN LEVEL (UNFRACTIONATED): Heparin Unfractionated: >1.1 [IU]/mL — ABNORMAL HIGH (ref 0.30–0.70)

## 2023-07-29 LAB — APTT: aPTT: >200 s (ref 24–36)

## 2023-07-29 MED ORDER — ROMIPLOSTIM 125 MCG ~~LOC~~ SOLR
1.0000 ug/kg | Freq: Once | SUBCUTANEOUS | Status: AC
  Administered 2023-07-29: 90 ug via SUBCUTANEOUS
  Filled 2023-07-29: qty 0.18

## 2023-07-29 MED ORDER — SODIUM CHLORIDE 0.9 % IV SOLN
40.0000 mg | Freq: Once | INTRAVENOUS | Status: AC
  Administered 2023-07-29: 40 mg via INTRAVENOUS
  Filled 2023-07-29: qty 4

## 2023-07-29 MED ORDER — DEXAMETHASONE SODIUM PHOSPHATE 10 MG/ML IJ SOLN: 40.0000 mg | Freq: Once | INTRAMUSCULAR | Status: DC

## 2023-07-29 MED ORDER — CHLORHEXIDINE GLUCONATE CLOTH 2 % EX PADS
6.0000 | MEDICATED_PAD | Freq: Every day | CUTANEOUS | Status: DC
  Administered 2023-07-30 – 2023-08-06 (×8): 6 via TOPICAL

## 2023-07-29 MED ORDER — HEPARIN (PORCINE) 25000 UT/250ML-% IV SOLN: 1200.0000 [IU]/h | INTRAVENOUS | Status: DC

## 2023-07-29 MED ORDER — IMMUNE GLOBULIN (HUMAN) 10 GM/100ML IV SOLN
1.0000 g/kg | INTRAVENOUS | Status: AC
  Administered 2023-07-29 – 2023-07-30 (×2): 80 g via INTRAVENOUS
  Filled 2023-07-29 (×2): qty 800

## 2023-07-29 NOTE — Progress Notes (Signed)
 Mobility Specialist - Progress Note   07/29/23 1350  Mobility  Activity Stood at bedside  Level of Assistance Contact guard assist, steadying assist  Activity Response Tolerated well  Mobility Referral Yes  Mobility visit 1 Mobility  Mobility Specialist Start Time (ACUTE ONLY) 1320  Mobility Specialist Stop Time (ACUTE ONLY) 1349  Mobility Specialist Time Calculation (min) (ACUTE ONLY) 29 min   Pt received in bed and agreeable to mobility. Pt tolerated x5 STS.  No complaints during session. Pt to bed after session with all needs met.     [facility]

## 2023-07-29 NOTE — Progress Notes (Addendum)
 PHARMACY - ANTICOAGULATION CONSULT NOTE  Pharmacy Consult for heparin Indication:  history of DVT (Xarelto PTA)  Allergies  Allergen Reactions   Diphenhydramine Other (See Comments)    Affects kidneys   Famotidine Other (See Comments)    Affects kidneys   Beef-Derived Drug Products Other (See Comments)    Vegetarian   Chicken Protein Other (See Comments)    Vegetarian    Pork-Derived Products Other (See Comments)    Vegetarian     Patient Measurements: Height: 5\' 11"  (180.3 cm) Weight: 90.6 kg (199 lb 11.8 oz) IBW/kg (Calculated) : 75.3 HEPARIN DW (KG): 90.6  Vital Signs: Temp: 98.4 F (36.9 C) (04/15 0506) Temp Source: Oral (04/15 0506) BP: 120/74 (04/15 0506) Pulse Rate: 72 (04/15 0506)  Labs: Recent Labs    07/28/23 1152 07/28/23 1219 07/28/23 1750 07/29/23 0654  HGB 12.0*  --   --   --   HCT 38.5*  --   --   --   PLT 133*  --   --   --   APTT  --   --  47*  --   LABPROT  --  20.8*  --  16.0*  INR  --  1.8*  --  1.3*  HEPARINUNFRC  --   --  >1.10* >1.10*  CREATININE 1.68*  --   --  1.37*  TROPONINIHS  --   --  9  --     Estimated Creatinine Clearance: 51.2 mL/min (A) (by C-G formula based on SCr of 1.37 mg/dL (H)).   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 Xarelto is being held on admission, pharmacy consulted to dose heparin.   Today, 07/29/23 aPTT >200 secs - supratherapeutic on heparin 1450 units/hr Confirmed with phlebotomist that aPTT and heparin level were both drawn from the opposite arm that heparin is running (heparin running in R arm, labs drawn from L arm). Heparin level remains >1.10 - likely elevated from both heparin administration and recent DOAC administration. Hgb 12 >> 10.2 from yesterday Plts 133 >> 92 from yesterday (hx of ITP) No s/sx of bleeding reported   Goal of Therapy:  Heparin level 0.3-0.7 units/ml aPTT 66-102 seconds Monitor platelets by anticoagulation protocol: Yes    Plan:  Per MD, hold heparin today and follow-up CBC in the AM for possible heparin or Xarelto restart Check CBC 4/16 AM Continue to monitor for s/sx of bleeding    Roselee Cong, PharmD Clinical Pharmacist  4/15/20258:40  AM

## 2023-07-29 NOTE — Progress Notes (Signed)
  Progress Note   Patient: Mark [name] LKG:[id number] DOB: [birth year] DOA: 07/28/2023     1 DOS: the patient was seen and examined on 07/29/2023   Brief hospital course:  BERLIN MOKRY is a 78 y.o. male with medical history significant of prior DVT, prior seizure episodes as well as stroke with chronic left hemiparesis, neurogenic bladder does intermittent self cath at home.  He presented with fevers and rigors, profound worsened weakness, foul-smelling cloudy appearing urine.    Assessment and Plan:  Sepsis due to UTI --Continue empiric IV Rocephin --Follow urine & blood cultures --Foley was placed on admission --Pt does intermittent self catheterization at home --Antihypertensives held on admission due to soft BP's --Bps improved with IV fluids --Monitor fever curve, CBC, hemodynamics  AKI superimposed on CKD stage IIIa Cr 1.68 on admission, baseline appears around 1.3-1.4. Suspect pre-renal. Improved with IV fluids. --Off IV fluids --Monitor BMP --Monitor urine output  Idiopathic thrombocytopenic purpura - acute recurrence --Dr. Maria Shiner is following, appreciate his care, plans for Rituxan --Getting IVIG,  Decadron, Nplate --Follow daily CBC's  History of DVT --Holding Xarelto --Placed on heparin drip on admission, but given acute ITP and platelets dropping, critically high aPTT this AM, will hold all anticoagulation today.  Discussed with pt, he is agreeable. --Stop heparin drip, continue hold Xarelto --Avoid blood thinner and antiplatelets for now.  Seizure  --Continue Keppra  GERD (gastroesophageal reflux disease) --Continue PPI  Depression --Continue Wellbutrin and Zoloft  Dyslipidemia --Continue aspirin, statin       Subjective: Pt seen awake resting in bed this AM. Reports feeling better since admission.  States he was very confused and weak, feels better today.    Physical Exam: Vitals:   07/29/23 1123 07/29/23 1158 07/29/23 1213 07/29/23 1313   BP: 131/72 119/72 130/68 139/77  Pulse: 80 77 78 81  Resp:      Temp: 98.4 F (36.9 C) 98.5 F (36.9 C) 99.3 F (37.4 C) 98.2 F (36.8 C)  TempSrc: Oral Oral Oral Oral  SpO2: 91% 94% 94% 93%  Weight:      Height:       General exam: awake, alert, no acute distress HEENT: atraumatic, clear conjunctiva, anicteric sclera,moist mucus membranes, hearing grossly normal  Respiratory system: CTAB, no wheezes, rales or rhonchi, normal respiratory effort. Cardiovascular system: normal S1/S2,  RRR, no JVD, murmurs, rubs, gallops, no pedal edema.   Gastrointestinal system: soft, NT, ND, no HSM felt, +bowel sounds. Central nervous system: A&O x 3. Left sided hemiparesis noted, normal speech Skin: dry, intact, normal temperature Psychiatry: normal mood, congruent affect, judgement and insight appear normal '   Data Reviewed:  Notable labs --   Normal BMP except Cr 1.37 (improved from 1.68), Ca 8.7 WBC 13.7 improved from 23.9 Hbg 10.2 Platelet trend -- 133 >> 92  Family Communication: none present at bedside on rounds  Disposition: Status is: Inpatient Remains inpatient appropriate because: on IV therapies as above    Planned Discharge Destination: Home    Time spent: 45 minutes  Author: Montey Apa, DO 07/29/2023 4:17 PM  For on call review [email or web address].

## 2023-07-29 NOTE — Plan of Care (Signed)
  Problem: Education: Goal: Knowledge of General Education information will improve Description: Including pain rating scale, medication(s)/side effects and non-pharmacologic comfort measures 07/29/2023 0138 by Elyce Hams, RN Outcome: Progressing 07/29/2023 0138 by Elyce Hams, RN Outcome: Progressing   Problem: Health Behavior/Discharge Planning: Goal: Ability to manage health-related needs will improve 07/29/2023 0138 by Elyce Hams, RN Outcome: Progressing 07/29/2023 0138 by Elyce Hams, RN Outcome: Progressing   Problem: Clinical Measurements: Goal: Ability to maintain clinical measurements within normal limits will improve 07/29/2023 0138 by Elyce Hams, RN Outcome: Progressing 07/29/2023 0138 by Elyce Hams, RN Outcome: Progressing Goal: Will remain free from infection 07/29/2023 0138 by Elyce Hams, RN Outcome: Progressing 07/29/2023 0138 by Elyce Hams, RN Outcome: Progressing Goal: Diagnostic test results will improve 07/29/2023 0138 by Elyce Hams, RN Outcome: Progressing 07/29/2023 0138 by Elyce Hams, RN Outcome: Progressing Goal: Respiratory complications will improve 07/29/2023 0138 by Elyce Hams, RN Outcome: Progressing 07/29/2023 0138 by Elyce Hams, RN Outcome: Progressing Goal: Cardiovascular complication will be avoided 07/29/2023 0138 by Elyce Hams, RN Outcome: Progressing 07/29/2023 0138 by Elyce Hams, RN Outcome: Progressing   Problem: Activity: Goal: Risk for activity intolerance will decrease 07/29/2023 0138 by Elyce Hams, RN Outcome: Progressing 07/29/2023 0138 by Elyce Hams, RN Outcome: Progressing   Problem: Nutrition: Goal: Adequate nutrition will be maintained 07/29/2023 0138 by Elyce Hams, RN Outcome: Progressing 07/29/2023 0138 by Elyce Hams, RN Outcome: Progressing   Problem: Coping: Goal: Level of anxiety will decrease 07/29/2023 0138 by Elyce Hams, RN Outcome: Progressing 07/29/2023 0138 by Elyce Hams, RN Outcome: Progressing    Problem: Elimination: Goal: Will not experience complications related to bowel motility 07/29/2023 0138 by Elyce Hams, RN Outcome: Progressing 07/29/2023 0138 by Elyce Hams, RN Outcome: Progressing Goal: Will not experience complications related to urinary retention 07/29/2023 0138 by Elyce Hams, RN Outcome: Progressing 07/29/2023 0138 by Elyce Hams, RN Outcome: Progressing   Problem: Pain Managment: Goal: General experience of comfort will improve and/or be controlled 07/29/2023 0138 by Elyce Hams, RN Outcome: Progressing 07/29/2023 0138 by Elyce Hams, RN Outcome: Progressing   Problem: Safety: Goal: Ability to remain free from injury will improve 07/29/2023 0138 by Elyce Hams, RN Outcome: Progressing 07/29/2023 0138 by Elyce Hams, RN Outcome: Progressing   Problem: Skin Integrity: Goal: Risk for impaired skin integrity will decrease 07/29/2023 0138 by Elyce Hams, RN Outcome: Progressing 07/29/2023 0138 by Elyce Hams, RN Outcome: Progressing

## 2023-07-29 NOTE — Consult Note (Signed)
 Mark [name] is well-known to me.  He is a very nice 78 year old white male.  He recent was hospitalized because of another relapse of ITP.  He received Decadron/IVIG/Nplate.  He responded incredibly well.  He was taken over to [facility].  He was there for a week or so.  He has had a past history of stroke with left-sided weakness.  He does have to do catheterizations for his urine.  It sounds like he may have had a UTI.  He may have a little bit of sepsis.  He was admitted yesterday.  His platelet count was down 133,000.  Again, his platelet count currently dropped quickly.  He is to get Rituxan in our office this week I think on Friday.  He feels pretty well today.  I am unsure his cultures have come back yet.  There was white cell count was quite high at 23.9.  He feels okay right now.  He has had no nausea or vomiting.  There has been no bleeding.  He is on heparin.  He has had no diarrhea.  He has had no rashes.  Is been no ecchymosis or petechia.  When he came in, his BUN is 29 creatinine 1.68.  We will have to see what today's labs are.   His vital signs are all stable.  Temperature 98.4.  Pulse 72.  Blood pressure 120/74.  His head and neck exam shows no ocular or oral lesions.  There are no palpable cervical or supraclavicular lymph nodes.  Lungs are clear bilaterally.  Cardiac exam regular rate and rhythm.  Has no murmurs.  Abdomen soft.  Bowel sounds are present.  There is no fluid wave.  There is no palpable liver or spleen tip.  Extremities shows weakness over on the left side.  Right side is unremarkable.  Skin exam shows no rashes.   Again, I suspect that the platelet count will keep going down.  Having the likely urinary tract infection is certainly not going to help.  I think it be worthwhile giving him another dose of Nplate/IVIG/Decadron.  I do not think this would affect the UTI.  We will have to see what the cultures show.  Maybe, he will be able to go home in a  day or so.  Again, I will have him on Rituxan to try to help prevent relapses.  Ideally, a splenectomy probably would work very well for him.  I do think this would be difficult for him to tolerate at the present time.  I know that he will get incredible care from everybody upon 6 E.  Hopefully, this will be a very short hospital stay.   Charmayne Cooper, MD  Mark [name] 21:9

## 2023-07-30 DIAGNOSIS — D693 Immune thrombocytopenic purpura: Secondary | ICD-10-CM | POA: Diagnosis not present

## 2023-07-30 DIAGNOSIS — A419 Sepsis, unspecified organism: Secondary | ICD-10-CM | POA: Diagnosis not present

## 2023-07-30 LAB — URINE CULTURE: Culture: >=100000 — AB

## 2023-07-30 LAB — BASIC METABOLIC PANEL WITH GFR
Anion gap: 9 (ref 5–15)
BUN: 25 mg/dL — ABNORMAL HIGH (ref 8–23)
CO2: 21 mmol/L — ABNORMAL LOW (ref 22–32)
Calcium: 9 mg/dL (ref 8.9–10.3)
Chloride: 107 mmol/L (ref 98–111)
Creatinine, Ser: 1.37 mg/dL — ABNORMAL HIGH (ref 0.61–1.24)
GFR, Estimated: 53 mL/min — ABNORMAL LOW (ref >60)
Glucose, Bld: 155 mg/dL — ABNORMAL HIGH (ref 70–99)
Potassium: 3.8 mmol/L (ref 3.5–5.1)
Sodium: 137 mmol/L (ref 135–145)

## 2023-07-30 LAB — CBC
HCT: 33.2 % — ABNORMAL LOW (ref 39.0–52.0)
Hemoglobin: 10.3 g/dL — ABNORMAL LOW (ref 13.0–17.0)
MCH: 29.9 pg (ref 26.0–34.0)
MCHC: 31 g/dL (ref 30.0–36.0)
MCV: 96.5 fL (ref 80.0–100.0)
Platelets: 70 10*3/uL — ABNORMAL LOW (ref 150–400)
RBC: 3.44 MIL/uL — ABNORMAL LOW (ref 4.22–5.81)
RDW: 17 % — ABNORMAL HIGH (ref 11.5–15.5)
WBC: 14.5 10*3/uL — ABNORMAL HIGH (ref 4.0–10.5)
nRBC: 0 % (ref 0.0–0.2)

## 2023-07-30 MED ORDER — SODIUM CHLORIDE 0.9 % IV SOLN
40.0000 mg | Freq: Once | INTRAVENOUS | Status: AC
  Administered 2023-07-30: 40 mg via INTRAVENOUS
  Filled 2023-07-30: qty 4

## 2023-07-30 MED ORDER — CEFADROXIL 500 MG PO CAPS
1000.0000 mg | ORAL_CAPSULE | Freq: Two times a day (BID) | ORAL | Status: AC
  Administered 2023-07-30 – 2023-08-03 (×10): 1000 mg via ORAL
  Filled 2023-07-30 (×10): qty 2

## 2023-07-30 NOTE — Progress Notes (Signed)
 PROGRESS NOTE    Mark [name]  YNW:[id number] DOB: 1945-06-26 DOA: 07/28/2023 PCP: Victorio Grave, MD   Brief Narrative:  78 y.o. male with medical history significant of prior DVT, prior seizure episodes as well as stroke with chronic left hemiparesis, neurogenic bladder does intermittent self cath at home presented with fevers and rigors with weakness and foul-smelling urine.  He was found to have UTI and started on IV antibiotics.  Assessment & Plan:   Sepsis: Present on admission UTI: Present on admission, possibly related to intermittent self-catheterization - Blood cultures negative so far.  Urine culture growing E. coli.  Currently on Rocephin. -Hemodynamically improving.  Off IV fluids.  No temperature spikes over the last 24 hours  AKI on CKD stage IIIa - Creatinine 1.68 on presentation; baseline creatinine of 1.3-1.4.  Treated with IV fluids and creatinine has improved to 1.37 today.  Monitor intermittently  Leukocytosis - Improving.  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  Platelets 70 today.  Monitor.  Getting IVIG, Decadron.  Received Nplate yesterday.  Will DC aspirin.  Anemia of chronic disease -from chronic illnesses.  Hemoglobin currently stable.  Monitor intermittently  History of DVT - Xarelto on hold.  History of seizures - Continue Keppra  Hyperlipidemia -Continue statin  Depression - Continue Wellbutrin and Zoloft  GERD - Continue PPI  Physical deconditioning - PT eval   DVT prophylaxis: SCDs Code Status: DNR Family Communication: None at bedside Disposition Plan: Status is: Inpatient Remains inpatient appropriate because: Of severity of illness    Consultants: Oncology  Procedures: None  Antimicrobials:  Anti-infectives (From admission, onward)    Start     Dose/Rate Route Frequency Ordered Stop   07/29/23 1500  cefTRIAXone (ROCEPHIN) 1 g in sodium chloride 0.9 % 100 mL IVPB        1 g 200 mL/hr over  30 Minutes Intravenous Every 24 hours 07/28/23 1705     07/28/23 1445  cefTRIAXone (ROCEPHIN) 1 g in sodium chloride 0.9 % 100 mL IVPB        1 g 200 mL/hr over 30 Minutes Intravenous  Once 07/28/23 1444 07/28/23 1617   07/28/23 0000  cefpodoxime (VANTIN) 200 MG tablet  Status:  Discontinued        200 mg Oral 2 times daily 07/28/23 1445 07/28/23         Subjective: Patient seen and examined at bedside.  No fever, vomiting, chest pain reported.  Still feels weak.  Objective: Vitals:   07/29/23 1213 07/29/23 1313 07/29/23 2100 07/30/23 0500  BP: 130/68 139/77 (!) 150/94 131/69  Pulse: 78 81 100 81  Resp:   20 20  Temp: 99.3 F (37.4 C) 98.2 F (36.8 C) 98.6 F (37 C) 98 F (36.7 C)  TempSrc: Oral Oral Oral Oral  SpO2: 94% 93% 96% 94%  Weight:      Height:        Intake/Output Summary (Last 24 hours) at 07/30/2023 0816 Last data filed at 07/30/2023 0600 Gross per 24 hour  Intake 480 ml  Output 600 ml  Net -120 ml   Filed Weights   07/28/23 1130  Weight: 90.6 kg    Examination:  General exam: Appears calm and comfortable. Looks chronically ill and deconditioned Respiratory system: Bilateral decreased breath sounds at bases with scattered crackles Cardiovascular system: S1 & S2 heard, Rate controlled Gastrointestinal system: Abdomen is nondistended, soft and nontender. Normal bowel sounds heard. Extremities: No cyanosis, clubbing, edema  Central  nervous system: Alert and oriented.  Left hemiparesis noted.  Slow to respond.   Skin: No rashes, lesions or ulcers Psychiatry: Flat affect.  Not agitated.   Data Reviewed: I have personally reviewed following labs and imaging studies  CBC: Recent Labs  Lab 07/28/23 1152 07/29/23 0654 07/30/23 0552  WBC 23.9* 13.7* 14.5*  NEUTROABS 16.6*  --   --   HGB 12.0* 10.2* 10.3*  HCT 38.5* 33.0* 33.2*  MCV 95.3 96.2 96.5  PLT 133* 92* 70*   Basic Metabolic Panel: Recent Labs  Lab 07/28/23 1152 07/29/23 0654  07/30/23 0552  NA 136 137 137  K 4.2 4.1 3.8  CL 105 106 107  CO2 22 23 21*  GLUCOSE 107* 96 155*  BUN 29* 22 25*  CREATININE 1.68* 1.37* 1.37*  CALCIUM 9.3 8.7* 9.0   GFR: Estimated Creatinine Clearance: 51.2 mL/min (A) (by C-G formula based on SCr of 1.37 mg/dL (H)). Liver Function Tests: Recent Labs  Lab 07/28/23 1152  AST 35  ALT 31  ALKPHOS 68  BILITOT 0.4  PROT 7.9  ALBUMIN 3.6   No results for input(s): "LIPASE", "AMYLASE" in the last 168 hours. No results for input(s): "AMMONIA" in the last 168 hours. Coagulation Profile: Recent Labs  Lab 07/28/23 1219 07/29/23 0654  INR 1.8* 1.3*   Cardiac Enzymes: No results for input(s): "CKTOTAL", "CKMB", "CKMBINDEX", "TROPONINI" in the last 168 hours. BNP (last 3 results) No results for input(s): "PROBNP" in the last 8760 hours. HbA1C: No results for input(s): "HGBA1C" in the last 72 hours. CBG: No results for input(s): "GLUCAP" in the last 168 hours. Lipid Profile: No results for input(s): "CHOL", "HDL", "LDLCALC", "TRIG", "CHOLHDL", "LDLDIRECT" in the last 72 hours. Thyroid Function Tests: No results for input(s): "TSH", "T4TOTAL", "FREET4", "T3FREE", "THYROIDAB" in the last 72 hours. Anemia Panel: No results for input(s): "VITAMINB12", "FOLATE", "FERRITIN", "TIBC", "IRON", "RETICCTPCT" in the last 72 hours. Sepsis Labs: Recent Labs  Lab 07/28/23 1328  LATICACIDVEN 1.4    Recent Results (from the past 240 hours)  Blood Culture (routine x 2)     Status: None (Preliminary result)   Collection Time: 07/28/23 11:52 AM   Specimen: BLOOD RIGHT FOREARM  Result Value Ref Range Status   Specimen Description   Final    BLOOD RIGHT FOREARM Performed at [facility], [address] [place], [place] [address]    Special Requests   Final    BOTTLES DRAWN AEROBIC AND ANAEROBIC Blood Culture results may not be optimal due to an inadequate volume of blood received in culture bottles Performed at  [facility], [address] [place], [place] [address]    Culture   Final    NO GROWTH < 24 HOURS Performed at [facility], [address], [place], [place] [address]    Report Status PENDING  Incomplete  Blood Culture (routine x 2)     Status: None (Preliminary result)   Collection Time: 07/28/23 12:19 PM   Specimen: BLOOD RIGHT HAND  Result Value Ref Range Status   Specimen Description   Final    BLOOD RIGHT HAND Performed at [facility], [address], [place], [place] [address]    Special Requests   Final    BOTTLES DRAWN AEROBIC AND ANAEROBIC Blood Culture adequate volume Performed at [facility], [address] [place], [place] [address]    Culture   Final    NO GROWTH < 24 HOURS  Performed at [facility], [address], [place], [place] [address]    Report Status PENDING  Incomplete  Urine Culture     Status: Abnormal   Collection Time: 07/28/23  1:35 PM   Specimen: Urine, Random  Result Value Ref Range Status   Specimen Description   Final    URINE, RANDOM Performed at [facility], [address], [place], [place] [address]    Special Requests   Final    NONE Reflexed from [id number] Performed at [facility], [address], [place], [place] [address]    Culture >=100,000 COLONIES/mL ESCHERICHIA COLI (A)  Final   Report Status 07/30/2023 FINAL  Final   Organism ID, Bacteria ESCHERICHIA COLI (A)  Final      Susceptibility   Escherichia coli - MIC*    AMPICILLIN 4 SENSITIVE Sensitive     CEFAZOLIN <=4 SENSITIVE Sensitive     CEFEPIME <=0.12 SENSITIVE Sensitive     CEFTRIAXONE <=0.25 SENSITIVE Sensitive     CIPROFLOXACIN <=0.25 SENSITIVE Sensitive     GENTAMICIN <=1 SENSITIVE Sensitive     IMIPENEM <=0.25 SENSITIVE Sensitive     NITROFURANTOIN <=16 SENSITIVE Sensitive     TRIMETH/SULFA <=20 SENSITIVE Sensitive     AMPICILLIN/SULBACTAM <=2 SENSITIVE  Sensitive     PIP/TAZO <=4 SENSITIVE Sensitive ug/mL    * >=100,000 COLONIES/mL ESCHERICHIA COLI         Radiology Studies: DG Chest Port 1 View Result Date: 07/28/2023 CLINICAL DATA:  Left-sided paralysis, fever. Pyuria. Possible sepsis. EXAM: PORTABLE CHEST 1 VIEW COMPARISON:  07/15/2023 FINDINGS: The patient is rotated to the right on today's radiograph, reducing diagnostic sensitivity and specificity. Low lung volumes are present, causing crowding of the pulmonary vasculature. Indistinct pulmonary vasculature, cannot exclude pulmonary venous hypertension although no overt cardiomegaly is observed. Chronic right lower rib deformities likely from old healed fractures. Vertebral augmentations at T12 and L1. No overt consolidation/airspace opacity identified. No blunting of the costophrenic angles. IMPRESSION: 1. Low lung volumes are present, causing crowding of the pulmonary vasculature. 2. Indistinct pulmonary vasculature, cannot exclude pulmonary venous hypertension although no overt cardiomegaly is observed. 3. Chronic right lower rib deformities likely from old healed fractures. 4. Vertebral augmentations at T12 and L1. Electronically Signed   By: Freida Jes M.D.   On: 07/28/2023 14:22        Scheduled Meds:  aspirin  81 mg Oral Daily   baclofen  10 mg Oral BID   buPROPion  150 mg Oral Daily   Chlorhexidine Gluconate Cloth  6 each Topical Daily   gabapentin  100 mg Oral TID   levETIRAcetam  1,500 mg Oral BID   melatonin  5 mg Oral QHS   pantoprazole  40 mg Oral Daily   rosuvastatin  40 mg Oral QHS   sertraline  100 mg Oral Daily   sodium chloride flush  3 mL Intravenous Q12H   Continuous Infusions:  cefTRIAXone (ROCEPHIN)  IV 1 g (07/29/23 1522)   dexamethasone (DECADRON) IVPB ([facility])     Immune Globulin 10%            Audria Leather, MD [facility] 07/30/2023, 8:16 AM

## 2023-07-30 NOTE — Progress Notes (Signed)
 Mr. Campanelli has gram-negative rods in his urine.  The ID is not back yet.  I will think this probably is going to be E. coli.  He is on antibiotics for this.  Again I am not surprised by his platelet count.  Today, his platelet count is down to 70,000.  I am sure this is mostly his ITP that is relapsing.  Also, have the E. coli in the urine is certainly contributing.  He did get Nplate, Decadron, IVIG yesterday.  It may take a little bit for this to work.  I will give him a dose of Decadron and IVIG today.  He has had no bleeding.  He has had no nausea or vomiting.  He is eating okay.  He has had no problems with bowels or bladder.  He may have had some loose stools.  His vital signs are stable.  Temperature 98.  Pulse 81.  Blood pressure 131/69.  His head neck exam shows no ocular or oral lesions.  Lungs are clear.  Cardiac exam regular rate and rhythm.  Abdomen is slightly distended.  Bowel sounds are present.  There is no guarding or rebound tenderness.  There is no palpable liver or spleen tip.  Extremity shows a weakness on the left side.  Neurological exam shows the weakness on the left side.   Mr. Scobie has the gram-negative rod UTI.  He is on antibiotics for this.  Hopefully the ID will come out today.  His ITP is again relapsing.  Ultimately, he probably will need to have a splenectomy from my point of view.  I do still think he is able to have this right now.  Will see about another dose of Decadron in IVIG for him.  I do appreciate the great care that he is getting from the staff up on 6 E.  He is very appreciative of the compassion that the staff have.   Rayleen Cal, MD  Zoila Hines 7:7-8

## 2023-07-30 NOTE — Evaluation (Signed)
 Physical Therapy Evaluation Patient Details Name: Mark [name] MRN: [id number] DOB: 04-Jan-1946 Today's Date: 07/30/2023  History of Present Illness  78 y.o. male presented with fevers and rigors with weakness and foul-smelling urine. He was found to have UTI and started on IV antibiotics.  with medical history significant of prior DVT, prior seizure episodes as well as stroke with chronic left hemiparesis, neurogenic bladder does intermittent self cath at home  Clinical Impression  Pt admitted with above diagnosis.  Pt currently with functional limitations due to the deficits listed below (see PT Problem List). Pt will benefit from acute skilled PT to increase their independence and safety with mobility to allow discharge.    The patient   required mod support  for  mobility to sitting. +2 mod support for safe stand and pivot to and from [facility], using RUE support. \ patient ambulates with hemi walker   at baseline, has family support. Patient recently DC'd from [facility].  Patient plans return home. Recommend HHPT.  Patient incontinent of BM upon standing.         If plan is discharge home, recommend the following: Two people to help with walking and/or transfers;A lot of help with bathing/dressing/bathroom;Assistance with cooking/housework;Assist for transportation;Help with stairs or ramp for entrance   Can travel by private vehicle        Equipment Recommendations None recommended by PT  Recommendations for Other Services       Functional Status Assessment Patient has had a recent decline in their functional status and demonstrates the ability to make significant improvements in function in a reasonable and predictable amount of time.     Precautions / Restrictions Precautions Precautions: Fall Precaution/Restrictions Comments: Left hemiplegia CVA prior to admit Required Braces or Orthoses: Other Brace Other Brace: Left AFO Restrictions Weight Bearing Restrictions Per Provider Order:  No      Mobility  Bed Mobility Overal bed mobility: Needs Assistance Bed Mobility: Supine to Sit, Sit to Supine     Supine to sit: HOB elevated, Used rails, Min assist Sit to supine: Mod assist   General bed mobility comments: assist  trunk and legs    Transfers Overall transfer level: Needs assistance Equipment used: Rolling walker (2 wheels) Transfers: Sit to/from Stand, Bed to chair/wheelchair/BSC Sit to Stand: Mod assist, +2 physical assistance, +2 safety/equipment Stand pivot transfers: Mod assist, +2 safety/equipment, +2 physical assistance         General transfer comment: assist to rise and stabilize, patient able to reach to [facility] arms and pivot, incontinent of BM. Patient held onto 1 side of RW to step  to pivot back to bed. VCs hand placement for stand to sit, uncontrolled descent    Ambulation/Gait                  Stairs            Wheelchair Mobility     Tilt Bed    Modified Rankin (Stroke Patients Only)       Balance Overall balance assessment: Needs assistance Sitting-balance support: Feet supported, No upper extremity supported Sitting balance-Leahy Scale: Fair   Postural control: Posterior lean, Right lateral lean Standing balance support: Single extremity supported, Reliant on assistive device for balance, During functional activity Standing balance-Leahy Scale: Poor                               Pertinent Vitals/Pain Pain Assessment Pain Assessment: No/denies  pain    Home Living Family/patient expects to be discharged to:: Private residence Living Arrangements: Spouse/significant other Available Help at Discharge: Family;Available 24 hours/day Type of Home: House Home Access: Ramped entrance       Home Layout: One level Home Equipment: Shower seat;BSC/3in1;Cane - single point;Grab bars - toilet;Hand held shower head;Grab bars - tub/shower;Adaptive equipment;Wheelchair - Fish farm manager  Comments: pt reports being ambulatory for household distances using hemi walker. Pt has L PFRW, WC, shower chair/bench, bedside commode. Pt reports being independnet with cathing at home prior to admission.    Prior Function Prior Level of Function : Needs assist   Mobility (Cognitive): Intermittent cues         Mobility Comments: wife utilizes gait belt as needed, pt amb with hemi walker in the home,,DC from [facility] 07/21/23 ADLs Comments: wife assists with bathing, active wtih HHOT and PT     Extremity/Trunk Assessment        Lower Extremity Assessment LLE Deficits / Details: baseline L hemiplegia, pt maintains L LE in knee extension and drop foot noted, equino varus LLE Coordination: decreased fine motor;decreased gross motor    Cervical / Trunk Assessment Cervical / Trunk Assessment: Kyphotic  Communication   Communication Factors Affecting Communication: Hearing impaired    Cognition Arousal: Alert Behavior During Therapy: WFL for tasks assessed/performed   PT - Cognitive impairments: No apparent impairments                       PT - Cognition Comments: off  by 1 day of month Following commands: Intact       Cueing Cueing Techniques: Verbal cues     General Comments      Exercises     Assessment/Plan    PT Assessment Patient needs continued PT services  PT Problem List Decreased strength;Decreased range of motion;Decreased activity tolerance;Decreased balance;Decreased mobility;Decreased coordination;Impaired tone       PT Treatment Interventions DME instruction;Gait training;Stair training;Functional mobility training;Therapeutic activities;Therapeutic exercise;Balance training;Neuromuscular re-education;Patient/family education;Wheelchair mobility training    PT Goals (Current goals can be found in the Care Plan section)  Acute Rehab PT Goals Patient Stated Goal: return home, get stronger PT Goal Formulation: With patient Time For Goal  Achievement: 08/13/23 Potential to Achieve Goals: Good    Frequency Min 3X/week     Co-evaluation PT/OT/SLP Co-Evaluation/Treatment: Yes Reason for Co-Treatment: For patient/therapist safety PT goals addressed during session: Mobility/safety with mobility OT goals addressed during session: ADL's and self-care       AM-PAC PT "6 Clicks" Mobility  Outcome Measure Help needed turning from your back to your side while in a flat bed without using bedrails?: A Lot Help needed moving from lying on your back to sitting on the side of a flat bed without using bedrails?: A Lot Help needed moving to and from a bed to a chair (including a wheelchair)?: A Lot Help needed standing up from a chair using your arms (e.g., wheelchair or bedside chair)?: A Lot Help needed to walk in hospital room?: Total Help needed climbing 3-5 steps with a railing? : Total 6 Click Score: 10    End of Session Equipment Utilized During Treatment: Gait belt Activity Tolerance: Patient tolerated treatment well;Patient limited by fatigue Patient left: in bed;with call bell/phone within reach;with bed alarm set Nurse Communication: Mobility status PT Visit Diagnosis: Unsteadiness on feet (R26.81);Muscle weakness (generalized) (M62.81);Other abnormalities of gait and mobility (R26.89);Difficulty in walking, not elsewhere classified (R26.2) Hemiplegia -  Right/Left: Left    Time: 1400-1430 PT Time Calculation (min) (ACUTE ONLY): 30 min   Charges:   PT Evaluation $PT Eval Low Complexity: 1 Low   PT General Charges $$ ACUTE PT VISIT: 1 Visit         Abelina Hoes PT Acute Rehabilitation Services Office [phone] Weekend [phone]   Dareen Ebbing 07/30/2023, 3:11 PM

## 2023-07-30 NOTE — Evaluation (Signed)
 Occupational Therapy Evaluation Patient Details Name: Mark [name] MRN: [id number] DOB: 09-24-1945 Today's Date: 07/30/2023   History of Present Illness   78 y.o. male presented with fevers and rigors with weakness and foul-smelling urine. He was found to have UTI and started on IV antibiotics. PMH: prior DVT, prior seizure episodes as well as stroke with chronic left hemiparesis, neurogenic bladder does intermittent self cath at home     Clinical Impressions Patient is a 78 year old male who was admitted for above. Patient was living at home with supervision and physical A for Adls from wife at baseline. Currently, patient was +2 for safety to transfer to [facility] with patient noted to have loose BM. Patient was educated on concerns over subluxation of L shoulder and importance of protecting LUE. Patient verbalized understanding but needed consistent ed to maintain during tasks. Patient was noted to have decreased functional activity tolerance, decreased endurance, decreased standing balance, decreased safety awareness, and decreased knowledge of AD/AE impacting participation in ADLs. Plan is for patient to d/c home with wife when medically stable.      If plan is discharge home, recommend the following:   A lot of help with walking and/or transfers;A lot of help with bathing/dressing/bathroom;Assistance with cooking/housework;Assistance with feeding;Direct supervision/assist for medications management;Direct supervision/assist for financial management;Assist for transportation;Help with stairs or ramp for entrance;Supervision due to cognitive status     Functional Status Assessment   Patient has had a recent decline in their functional status and demonstrates the ability to make significant improvements in function in a reasonable and predictable amount of time.     Equipment Recommendations   None recommended by OT      Precautions/Restrictions   Precautions Precautions:  Fall Precaution/Restrictions Comments: Left hemiplegia CVA prior to admit Required Braces or Orthoses: Other Brace Other Brace: Left AFO Restrictions Weight Bearing Restrictions Per Provider Order: No     Mobility Bed Mobility Overal bed mobility: Needs Assistance Bed Mobility: Supine to Sit, Sit to Supine Rolling: Mod assist   Supine to sit: HOB elevated, Used rails, Min assist Sit to supine: Mod assist   General bed mobility comments: assist  trunk and legs        Balance Overall balance assessment: Needs assistance Sitting-balance support: Feet supported, No upper extremity supported Sitting balance-Leahy Scale: Fair   Postural control: Posterior lean, Right lateral lean Standing balance support: Single extremity supported, Reliant on assistive device for balance, During functional activity Standing balance-Leahy Scale: Poor           ADL either performed or assessed with clinical judgement   ADL Overall ADL's : Needs assistance/impaired Eating/Feeding: Set up Eating/Feeding Details (indicate cue type and reason): one handed techniques Grooming: Set up;Sitting   Upper Body Bathing: Minimal assistance;Sitting   Lower Body Bathing: Sit to/from stand;Sitting/lateral leans;Cueing for sequencing;+2 for physical assistance;+2 for safety/equipment;Moderate assistance   Upper Body Dressing : Minimal assistance;Sitting   Lower Body Dressing: Maximal assistance   Toilet Transfer: Moderate assistance;+2 for physical assistance;+2 for safety/equipment;Cueing for safety;Cueing for sequencing;Stand-pivot Toilet Transfer Details (indicate cue type and reason): cues for hand positioning Toileting- Clothing Manipulation and Hygiene: Maximal assistance;Sitting/lateral lean;Sit to/from stand               Vision Patient Visual Report: No change from baseline              Pertinent Vitals/Pain Pain Assessment Pain Assessment: No/denies pain     Extremity/Trunk  Assessment Upper Extremity Assessment Upper Extremity  Assessment: LUE deficits/detail;Right hand dominant LUE Deficits / Details: pt with hx CVA.  L UE non functional, increased tone. Pt reports he does perform ROM as well as the HHOT helps. patient noted to have one finger breath subluxation of this shoulder with education provided on protection of UE. patient reported having a splint at home but unclear if it is still there at this time. LUE Coordination: decreased fine motor;decreased gross motor   Lower Extremity Assessment Lower Extremity Assessment: Defer to PT evaluation LLE Deficits / Details: baseline L hemiplegia, pt maintains L LE in knee extension and drop foot noted, equino varus LLE Coordination: decreased fine motor;decreased gross motor   Cervical / Trunk Assessment Cervical / Trunk Assessment: Kyphotic   Communication Communication Factors Affecting Communication: Hearing impaired   Cognition Arousal: Alert Behavior During Therapy: Impulsive Cognition: History of cognitive impairments             OT - Cognition Comments: patient recognized this therapist from working together in past. appears to be at baseline with impulsive at baseline                                    Home Living Family/patient expects to be discharged to:: Private residence Living Arrangements: Spouse/significant other Available Help at Discharge: Family;Available 24 hours/day Type of Home: House Home Access: Ramped entrance Entrance Stairs-Number of Steps: 5 STE prefers to use steps not ramp Entrance Stairs-Rails: Left;Right;Can reach both Home Layout: One level     Bathroom Shower/Tub: Chief Strategy Officer: Handicapped height Bathroom Accessibility: Yes   Home Equipment: Shower seat;BSC/3in1;Cane - single point;Grab bars - toilet;Hand held shower head;Grab bars - tub/shower;Adaptive equipment;Wheelchair - Sport and exercise psychologist Comments: pt  reports being ambulatory for household distances using hemi walker. Pt has L PFRW, WC, shower chair/bench, bedside commode. Pt reports being independnet with cathing at home prior to admission.  Lives With: Spouse    Prior Functioning/Environment Prior Level of Function : Needs assist  Cognitive Assist : Mobility (cognitive);ADLs (cognitive) Mobility (Cognitive): Intermittent cues ADLs (Cognitive): Intermittent cues Physical Assist : Mobility (physical);ADLs (physical) Mobility (physical): Transfers;Gait;Stairs ADLs (physical): Bathing;IADLs Mobility Comments: wife utilizes gait belt as needed, pt amb with hemi walker in the home,,DC from [facility] 07/21/23 ADLs Comments: wife assists with bathing, active wtih HHOT and PT    OT Problem List: Decreased strength;Decreased range of motion;Decreased activity tolerance;Impaired balance (sitting and/or standing);Decreased coordination;Decreased safety awareness;Decreased knowledge of use of DME or AE;Impaired tone;Impaired UE functional use   OT Treatment/Interventions: Self-care/ADL training;Therapeutic exercise;Neuromuscular education;DME and/or AE instruction;Therapeutic activities;Patient/family education;Balance training      OT Goals(Current goals can be found in the care plan section)   Acute Rehab OT Goals Patient Stated Goal: to go back home OT Goal Formulation: With patient Time For Goal Achievement: 08/13/23 Potential to Achieve Goals: Fair   OT Frequency:  Min 2X/week    Co-evaluation   Reason for Co-Treatment: For patient/therapist safety PT goals addressed during session: Mobility/safety with mobility OT goals addressed during session: ADL's and self-care      AM-PAC OT "6 Clicks" Daily Activity     Outcome Measure Help from another person eating meals?: A Little Help from another person taking care of personal grooming?: A Little Help from another person toileting, which includes using toliet, bedpan, or urinal?:  Total Help from another person bathing (including washing, rinsing, drying)?: A Lot Help  from another person to put on and taking off regular upper body clothing?: A Lot Help from another person to put on and taking off regular lower body clothing?: A Lot 6 Click Score: 13   End of Session Equipment Utilized During Treatment: Gait belt;Other (comment) [facility]) Nurse Communication: Mobility status;Precautions  Activity Tolerance: Patient tolerated treatment well Patient left: in bed;with call bell/phone within reach;with bed alarm set  OT Visit Diagnosis: Unsteadiness on feet (R26.81);Other abnormalities of gait and mobility (R26.89);Muscle weakness (generalized) (M62.81);Other symptoms and signs involving the nervous system (R29.898);Hemiplegia and hemiparesis Hemiplegia - Right/Left: Left Hemiplegia - dominant/non-dominant: Non-Dominant Hemiplegia - caused by: Other cerebrovascular disease                Time: 1400-1425 OT Time Calculation (min): 25 min Charges:  OT General Charges $OT Visit: 1 Visit OT Evaluation $OT Eval Moderate Complexity: 1 Mod  Jamyrah Saur OTR/L, MS Acute Rehabilitation Department Office# [phone]   Jame Maze 07/30/2023, 3:25 PM

## 2023-07-30 NOTE — Plan of Care (Signed)

## 2023-07-31 DIAGNOSIS — A419 Sepsis, unspecified organism: Secondary | ICD-10-CM | POA: Diagnosis not present

## 2023-07-31 LAB — CBC
HCT: 32.3 % — ABNORMAL LOW (ref 39.0–52.0)
Hemoglobin: 9.8 g/dL — ABNORMAL LOW (ref 13.0–17.0)
MCH: 29.9 pg (ref 26.0–34.0)
MCHC: 30.3 g/dL (ref 30.0–36.0)
MCV: 98.5 fL (ref 80.0–100.0)
Platelets: 80 10*3/uL — ABNORMAL LOW (ref 150–400)
RBC: 3.28 MIL/uL — ABNORMAL LOW (ref 4.22–5.81)
RDW: 17 % — ABNORMAL HIGH (ref 11.5–15.5)
WBC: 15.5 10*3/uL — ABNORMAL HIGH (ref 4.0–10.5)
nRBC: 0 % (ref 0.0–0.2)

## 2023-07-31 LAB — BASIC METABOLIC PANEL WITH GFR
Anion gap: 7 (ref 5–15)
BUN: 29 mg/dL — ABNORMAL HIGH (ref 8–23)
CO2: 22 mmol/L (ref 22–32)
Calcium: 8.6 mg/dL — ABNORMAL LOW (ref 8.9–10.3)
Chloride: 107 mmol/L (ref 98–111)
Creatinine, Ser: 1.29 mg/dL — ABNORMAL HIGH (ref 0.61–1.24)
GFR, Estimated: 57 mL/min — ABNORMAL LOW (ref >60)
Glucose, Bld: 139 mg/dL — ABNORMAL HIGH (ref 70–99)
Potassium: 3.7 mmol/L (ref 3.5–5.1)
Sodium: 136 mmol/L (ref 135–145)

## 2023-07-31 LAB — MAGNESIUM: Magnesium: 2.3 mg/dL (ref 1.7–2.4)

## 2023-07-31 MED ORDER — GUAIFENESIN-DM 100-10 MG/5ML PO SYRP: 5.0000 mL | ORAL_SOLUTION | ORAL | Status: DC | PRN

## 2023-07-31 NOTE — Plan of Care (Signed)
   Problem: Education: Goal: Knowledge of General Education information will improve Description: Including pain rating scale, medication(s)/side effects and non-pharmacologic comfort measures Outcome: Progressing   Problem: Clinical Measurements: Goal: Respiratory complications will improve Outcome: Progressing Goal: Cardiovascular complication will be avoided Outcome: Progressing   Problem: Coping: Goal: Level of anxiety will decrease Outcome: Progressing   Problem: Safety: Goal: Ability to remain free from injury will improve Outcome: Progressing   Problem: Skin Integrity: Goal: Risk for impaired skin integrity will decrease Outcome: Progressing

## 2023-07-31 NOTE — Plan of Care (Signed)

## 2023-07-31 NOTE — Progress Notes (Signed)
 PROGRESS NOTE    Mark [name]  VWU:[id number] DOB: 1945/10/28 DOA: 07/28/2023 PCP: Laurann Montana, MD   Brief Narrative:  78 y.o. male with medical history significant of prior DVT, prior seizure episodes as well as stroke with chronic left hemiparesis, neurogenic bladder does intermittent self cath at home presented with fevers and rigors with weakness and foul-smelling urine.  He was found to have UTI and started on IV antibiotics.  Assessment & Plan:   Sepsis: Present on admission UTI: Present on admission, possibly related to intermittent self-catheterization - Blood cultures negative so far.  Urine culture growing E. coli.  Rocephin switched to cefadroxil on 07/30/2023 -Hemodynamically improving.  Off IV fluids.  No temperature spikes over the last 48 hours  AKI on CKD stage IIIa - Creatinine 1.68 on presentation; baseline creatinine of 1.3-1.4.  Treated with IV fluids and creatinine has improved to 1.37 on 07/30/2023.  Labs pending today..  Monitor intermittently  Leukocytosis - Labs pending today.  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  Platelets 70 04/19/1623.  Pending today.  Monitor.  Getting IVIG, Decadron.  Received Nplate already.  Aspirin discontinued.  Anemia of chronic disease -from chronic illnesses.  Hemoglobin currently stable.  Monitor intermittently  History of DVT - Xarelto on hold.  History of seizures - Continue Keppra  Hyperlipidemia -Continue statin  Depression - Continue Wellbutrin and Zoloft  GERD - Continue PPI  Physical deconditioning - PT recommended home health PT  DVT prophylaxis: SCDs Code Status: DNR Family Communication: None at bedside Disposition Plan: Status is: Inpatient Remains inpatient appropriate because: Of severity of illness    Consultants: Oncology  Procedures: None  Antimicrobials:  Anti-infectives (From admission, onward)    Start     Dose/Rate Route Frequency Ordered Stop   07/30/23 1200   cefadroxil (DURICEF) capsule 1,000 mg        1,000 mg Oral 2 times daily 07/30/23 1123 08/04/23 0959   07/29/23 1500  cefTRIAXone (ROCEPHIN) 1 g in sodium chloride 0.9 % 100 mL IVPB  Status:  Discontinued        1 g 200 mL/hr over 30 Minutes Intravenous Every 24 hours 07/28/23 1705 07/30/23 1123   07/28/23 1445  cefTRIAXone (ROCEPHIN) 1 g in sodium chloride 0.9 % 100 mL IVPB        1 g 200 mL/hr over 30 Minutes Intravenous  Once 07/28/23 1444 07/28/23 1617   07/28/23 0000  cefpodoxime (VANTIN) 200 MG tablet  Status:  Discontinued        200 mg Oral 2 times daily 07/28/23 1445 07/28/23         Subjective: Patient seen and examined at bedside.  Denies any bleeding, fever, vomiting or shortness of breath. Objective: Vitals:   07/30/23 1140 07/30/23 1342 07/30/23 2052 07/31/23 0631  BP: 136/77 (!) 150/85 126/70 (!) 145/78  Pulse: 77 81 73 63  Resp: 18 16 20 20   Temp: 98.8 F (37.1 C) 98.8 F (37.1 C) 98.6 F (37 C) 98.6 F (37 C)  TempSrc: Oral Oral Oral Oral  SpO2: 95% 95% 95% 91%  Weight:      Height:        Intake/Output Summary (Last 24 hours) at 07/31/2023 0811 Last data filed at 07/31/2023 0700 Gross per 24 hour  Intake 360 ml  Output 2050 ml  Net -1690 ml   Filed Weights   07/28/23 1130  Weight: 90.6 kg    Examination:  General: On room air.  No distress.  Chronically ill and deconditioned looking. ENT/neck: No thyromegaly.  JVD is not elevated  respiratory: Decreased breath sounds at bases bilaterally with some crackles; no wheezing  CVS: S1-S2 heard, rate controlled currently Abdominal: Soft, nontender, slightly distended; no organomegaly,  bowel sounds are heard Extremities: Trace lower extremity edema; no cyanosis  CNS: Awake and alert.  Left-sided hemiparesis noted.  Still slow to respond.   Lymph: No obvious lymphadenopathy Skin: No obvious ecchymosis/lesions  psych: Mostly flat affect.  Not agitated currently. musculoskeletal: No obvious joint  swelling/deformity    Data Reviewed: I have personally reviewed following labs and imaging studies  CBC: Recent Labs  Lab 07/28/23 1152 07/29/23 0654 07/30/23 0552  WBC 23.9* 13.7* 14.5*  NEUTROABS 16.6*  --   --   HGB 12.0* 10.2* 10.3*  HCT 38.5* 33.0* 33.2*  MCV 95.3 96.2 96.5  PLT 133* 92* 70*   Basic Metabolic Panel: Recent Labs  Lab 07/28/23 1152 07/29/23 0654 07/30/23 0552  NA 136 137 137  K 4.2 4.1 3.8  CL 105 106 107  CO2 22 23 21*  GLUCOSE 107* 96 155*  BUN 29* 22 25*  CREATININE 1.68* 1.37* 1.37*  CALCIUM 9.3 8.7* 9.0   GFR: Estimated Creatinine Clearance: 51.2 mL/min (A) (by C-G formula based on SCr of 1.37 mg/dL (H)). Liver Function Tests: Recent Labs  Lab 07/28/23 1152  AST 35  ALT 31  ALKPHOS 68  BILITOT 0.4  PROT 7.9  ALBUMIN 3.6   No results for input(s): "LIPASE", "AMYLASE" in the last 168 hours. No results for input(s): "AMMONIA" in the last 168 hours. Coagulation Profile: Recent Labs  Lab 07/28/23 1219 07/29/23 0654  INR 1.8* 1.3*   Cardiac Enzymes: No results for input(s): "CKTOTAL", "CKMB", "CKMBINDEX", "TROPONINI" in the last 168 hours. BNP (last 3 results) No results for input(s): "PROBNP" in the last 8760 hours. HbA1C: No results for input(s): "HGBA1C" in the last 72 hours. CBG: No results for input(s): "GLUCAP" in the last 168 hours. Lipid Profile: No results for input(s): "CHOL", "HDL", "LDLCALC", "TRIG", "CHOLHDL", "LDLDIRECT" in the last 72 hours. Thyroid Function Tests: No results for input(s): "TSH", "T4TOTAL", "FREET4", "T3FREE", "THYROIDAB" in the last 72 hours. Anemia Panel: No results for input(s): "VITAMINB12", "FOLATE", "FERRITIN", "TIBC", "IRON", "RETICCTPCT" in the last 72 hours. Sepsis Labs: Recent Labs  Lab 07/28/23 1328  LATICACIDVEN 1.4    Recent Results (from the past 240 hours)  Blood Culture (routine x 2)     Status: None (Preliminary result)   Collection Time: 07/28/23 11:52 AM   Specimen:  BLOOD RIGHT FOREARM  Result Value Ref Range Status   Specimen Description   Final    BLOOD RIGHT FOREARM Performed at [facility], [address], [place], [place] [address]    Special Requests   Final    BOTTLES DRAWN AEROBIC AND ANAEROBIC Blood Culture results may not be optimal due to an inadequate volume of blood received in culture bottles Performed at [facility], [address], [place], [place] [address]    Culture   Final    NO GROWTH 2 DAYS Performed at [facility], [address], [place], [place] [address]    Report Status PENDING  Incomplete  Blood Culture (routine x 2)     Status: None (Preliminary result)   Collection Time: 07/28/23 12:19 PM   Specimen: BLOOD RIGHT HAND  Result Value Ref Range Status   Specimen Description   Final    BLOOD  RIGHT HAND Performed at [facility], [address] [place], [place] [address]    Special Requests   Final    BOTTLES DRAWN AEROBIC AND ANAEROBIC Blood Culture adequate volume Performed at [facility], [address], [place], [place] [address]    Culture   Final    NO GROWTH 2 DAYS Performed at [facility], [address], [place], [place] [address]    Report Status PENDING  Incomplete  Urine Culture     Status: Abnormal   Collection Time: 07/28/23  1:35 PM   Specimen: Urine, Random  Result Value Ref Range Status   Specimen Description   Final    URINE, RANDOM Performed at [facility], [address] [place], [place] [address]    Special Requests   Final    NONE Reflexed from [id number] Performed at [facility], [address], [place], [place] [address]    Culture >=100,000 COLONIES/mL ESCHERICHIA COLI (A)  Final   Report Status 07/30/2023 FINAL  Final   Organism ID, Bacteria ESCHERICHIA COLI (A)  Final      Susceptibility   Escherichia coli - MIC*    AMPICILLIN 4 SENSITIVE Sensitive      CEFAZOLIN <=4 SENSITIVE Sensitive     CEFEPIME <=0.12 SENSITIVE Sensitive     CEFTRIAXONE <=0.25 SENSITIVE Sensitive     CIPROFLOXACIN <=0.25 SENSITIVE Sensitive     GENTAMICIN <=1 SENSITIVE Sensitive     IMIPENEM <=0.25 SENSITIVE Sensitive     NITROFURANTOIN <=16 SENSITIVE Sensitive     TRIMETH/SULFA <=20 SENSITIVE Sensitive     AMPICILLIN/SULBACTAM <=2 SENSITIVE Sensitive     PIP/TAZO <=4 SENSITIVE Sensitive ug/mL    * >=100,000 COLONIES/mL ESCHERICHIA COLI         Radiology Studies: No results found.       Scheduled Meds:  baclofen  10 mg Oral BID   buPROPion  150 mg Oral Daily   cefadroxil  1,000 mg Oral BID   Chlorhexidine Gluconate Cloth  6 each Topical Daily   gabapentin  100 mg Oral TID   levETIRAcetam  1,500 mg Oral BID   melatonin  5 mg Oral QHS   pantoprazole  40 mg Oral Daily   rosuvastatin  40 mg Oral QHS   sertraline  100 mg Oral Daily   sodium chloride flush  3 mL Intravenous Q12H   Continuous Infusions:          Audria Leather, MD [facility] 07/31/2023, 8:11 AM

## 2023-07-31 NOTE — Progress Notes (Addendum)
 Physical Therapy Treatment Patient Details Name: Mark [name] MRN: [id number] DOB: [birth year] Today's Date: 07/31/2023   History of Present Illness 78 y.o. male presented with fevers and rigors with weakness and foul-smelling urine. He was found to have UTI and started on IV antibiotics. PMH: prior DVT, prior seizure episodes as well as stroke with chronic left hemiparesis, neurogenic bladder does intermittent self cath at home    PT Comments  Mod assist for supine to sit, pt had poor sitting balance with posterior lean noted when he didn't have single UE support.  Mod assist sit to stand and to pivot to recliner with hemiwalker, poor standing balance noted as well. Pt was not able to weight shift in standing in order to attempt ambulation. Noted pt ambulated 100' at [facility] 1 week ago. He's had a significant decline in mobility and is not safe to DC home with his current functional status. He hopes to DC home with HHPT.     If plan is discharge home, recommend the following: Two people to help with walking and/or transfers;A lot of help with bathing/dressing/bathroom;Assistance with cooking/housework;Assist for transportation;Help with stairs or ramp for entrance   Can travel by private vehicle        Equipment Recommendations  None recommended by PT    Recommendations for Other Services       Precautions / Restrictions Precautions Precautions: Fall Recall of Precautions/Restrictions: Impaired Precaution/Restrictions Comments: Left hemiplegia CVA (15 years ago per pt); pt reports he forgets about his disabilities Required Braces or Orthoses: Other Brace Other Brace: Left AFO Restrictions Weight Bearing Restrictions Per Provider Order: No     Mobility  Bed Mobility Overal bed mobility: Needs Assistance Bed Mobility: Supine to Sit     Supine to sit: HOB elevated, Used rails, Mod assist     General bed mobility comments: assist  trunk and legs, and to pivot hips     Transfers Overall transfer level: Needs assistance Equipment used: Hemi-walker Transfers: Sit to/from Stand, Bed to chair/wheelchair/BSC Sit to Stand: Mod assist Stand pivot transfers: Mod assist         General transfer comment: assist to rise and stabilize, pt utilized hemiwalker in RUE, poor standing balance, unable to weight shift    Ambulation/Gait                   Stairs             Wheelchair Mobility     Tilt Bed    Modified Rankin (Stroke Patients Only)       Balance Overall balance assessment: Needs assistance Sitting-balance support: Feet supported, Single extremity supported Sitting balance-Leahy Scale: Poor Sitting balance - Comments: posterior lean without single UE support; verbal/visual cues to lean anteriorly 2* posterior lean Postural control: Posterior lean, Right lateral lean Standing balance support: Single extremity supported, Reliant on assistive device for balance, During functional activity Standing balance-Leahy Scale: Poor                              Communication Communication Factors Affecting Communication: Hearing impaired  Cognition Arousal: Alert Behavior During Therapy: WFL for tasks assessed/performed   PT - Cognitive impairments: No apparent impairments                         Following commands: Intact      Cueing Cueing Techniques: Verbal cues  Exercises  General Comments        Pertinent Vitals/Pain Pain Assessment Pain Assessment: No/denies pain Pain Score: 0-No pain    Home Living                          Prior Function            PT Goals (current goals can now be found in the care plan section) Acute Rehab PT Goals Patient Stated Goal: return home, get stronger PT Goal Formulation: With patient/family Time For Goal Achievement: 08/13/23 Potential to Achieve Goals: Good Progress towards PT goals: Progressing toward goals    Frequency     Min 3X/week      PT Plan      Co-evaluation              AM-PAC PT "6 Clicks" Mobility   Outcome Measure  Help needed turning from your back to your side while in a flat bed without using bedrails?: A Lot Help needed moving from lying on your back to sitting on the side of a flat bed without using bedrails?: A Lot Help needed moving to and from a bed to a chair (including a wheelchair)?: A Lot Help needed standing up from a chair using your arms (e.g., wheelchair or bedside chair)?: A Lot Help needed to walk in hospital room?: Total Help needed climbing 3-5 steps with a railing? : Total 6 Click Score: 10    End of Session Equipment Utilized During Treatment: Gait belt Activity Tolerance: Patient tolerated treatment well;Patient limited by fatigue Patient left: in chair;with chair alarm set;with call bell/phone within reach Nurse Communication: Mobility status PT Visit Diagnosis: Unsteadiness on feet (R26.81);Muscle weakness (generalized) (M62.81);Other abnormalities of gait and mobility (R26.89);Difficulty in walking, not elsewhere classified (R26.2) Hemiplegia - Right/Left: Left     Time: 1610-9604 PT Time Calculation (min) (ACUTE ONLY): 20 min  Charges:    $Therapeutic Activity: 8-22 mins PT General Charges $$ ACUTE PT VISIT: 1 Visit                     [name] PT 07/31/2023  Acute Rehabilitation Services  Office [phone]

## 2023-08-01 DIAGNOSIS — I959 Hypotension, unspecified: Secondary | ICD-10-CM

## 2023-08-01 DIAGNOSIS — N390 Urinary tract infection, site not specified: Secondary | ICD-10-CM | POA: Diagnosis not present

## 2023-08-01 DIAGNOSIS — A419 Sepsis, unspecified organism: Secondary | ICD-10-CM | POA: Diagnosis not present

## 2023-08-01 LAB — BASIC METABOLIC PANEL WITH GFR
Anion gap: 5 (ref 5–15)
BUN: 27 mg/dL — ABNORMAL HIGH (ref 8–23)
CO2: 23 mmol/L (ref 22–32)
Calcium: 8.2 mg/dL — ABNORMAL LOW (ref 8.9–10.3)
Chloride: 110 mmol/L (ref 98–111)
Creatinine, Ser: 1.37 mg/dL — ABNORMAL HIGH (ref 0.61–1.24)
GFR, Estimated: 53 mL/min — ABNORMAL LOW (ref >60)
Glucose, Bld: 110 mg/dL — ABNORMAL HIGH (ref 70–99)
Potassium: 3.3 mmol/L — ABNORMAL LOW (ref 3.5–5.1)
Sodium: 138 mmol/L (ref 135–145)

## 2023-08-01 LAB — CBC
HCT: 32.7 % — ABNORMAL LOW (ref 39.0–52.0)
Hemoglobin: 9.9 g/dL — ABNORMAL LOW (ref 13.0–17.0)
MCH: 29.4 pg (ref 26.0–34.0)
MCHC: 30.3 g/dL (ref 30.0–36.0)
MCV: 97 fL (ref 80.0–100.0)
Platelets: 95 10*3/uL — ABNORMAL LOW (ref 150–400)
RBC: 3.37 MIL/uL — ABNORMAL LOW (ref 4.22–5.81)
RDW: 17.3 % — ABNORMAL HIGH (ref 11.5–15.5)
WBC: 11.6 10*3/uL — ABNORMAL HIGH (ref 4.0–10.5)
nRBC: 0.5 % — ABNORMAL HIGH (ref 0.0–0.2)

## 2023-08-01 LAB — MAGNESIUM: Magnesium: 2 mg/dL (ref 1.7–2.4)

## 2023-08-01 MED ORDER — POTASSIUM CHLORIDE CRYS ER 20 MEQ PO TBCR
40.0000 meq | EXTENDED_RELEASE_TABLET | Freq: Once | ORAL | Status: AC
  Administered 2023-08-01: 40 meq via ORAL
  Filled 2023-08-01: qty 2

## 2023-08-01 MED ORDER — SODIUM CHLORIDE 0.9 % IV SOLN
40.0000 mg | Freq: Once | INTRAVENOUS | Status: AC
  Administered 2023-08-01: 40 mg via INTRAVENOUS
  Filled 2023-08-01: qty 4

## 2023-08-01 MED ORDER — RIVAROXABAN 20 MG PO TABS
20.0000 mg | ORAL_TABLET | Freq: Every day | ORAL | Status: DC
  Administered 2023-08-01 – 2023-08-05 (×5): 20 mg via ORAL
  Filled 2023-08-01 (×5): qty 1

## 2023-08-01 NOTE — Progress Notes (Signed)
 The platelet count is coming up.  His white cell count is 11.6.  Hemoglobin 9.9.  Platelet count 95,000.  His BUN is 27 creatinine 1.37.  He feels okay.  He has had no problems with nausea or vomiting.  There has been no bleeding.  I probably think we could restart his anticoagulation at this point.  I still think we have to watch him 1 more day just to make sure that the platelet count continues to improve and that his E. coli is adequately treated.  He has had no mouth sores.  He has had no problems with bowels or bladder.  He has had no fever.  He has been sitting in a chair.  His vital signs are stable.  Temperature 98.6.  Pulse 64.  Blood pressure 135/72.  Overall, there is no change in his physical exam.  Again, his platelet count is starting to come back up.  I still think we do 1 more day of Decadron .  I would like to see what his E. coli is sensitive to.  I would think that he could then go home over the weekend.  I will restart his anticoagulation.  I know this is very complicated.  Everybody on 6 E. I do want a great job with him.   Jeralyn Crease, MD  John 15:13

## 2023-08-01 NOTE — Plan of Care (Signed)
  Problem: Education: Goal: Knowledge of General Education information will improve Description: Including pain rating scale, medication(s)/side effects and non-pharmacologic comfort measures Outcome: Progressing   Problem: Clinical Measurements: [place]

## 2023-08-01 NOTE — Plan of Care (Signed)

## 2023-08-01 NOTE — Progress Notes (Signed)
 Occupational Therapy Treatment Patient Details Name: Mark [name] MRN: [id number] DOB: 1946-04-15 Today's Date: 08/01/2023   History of present illness 78 y.o. male presented with fevers and rigors with weakness and foul-smelling urine. He was found to have UTI and started on IV antibiotics. PMH: prior DVT, prior seizure episodes as well as stroke with chronic left hemiparesis, neurogenic bladder does intermittent self cath at home   OT comments  Patient seen for skilled OT session this am. Patient left 2 phone messages to wife but no return call to ensure L resting hand spint was issued at rehab as patient is relatively sure it went home with him. Patient performed well with bed mobility, BADL's EOB and recliner level as outlined with L AFO donned for increased stability with transfers. Patient continues to require Acute care skilled OT services to maximize safety and function and given his recent acute rehab family education and discharge, with family assist and support, all DME in home and HHOT, patient should be able to return home once discharged from hospital.       If plan is discharge home, recommend the following:  A lot of help with walking and/or transfers;A lot of help with bathing/dressing/bathroom;Assistance with cooking/housework;Assistance with feeding;Direct supervision/assist for medications management;Direct supervision/assist for financial management;Assist for transportation;Help with stairs or ramp for entrance;Supervision due to cognitive status   Equipment Recommendations  None recommended by OT       Precautions / Restrictions Precautions Precautions: Fall Recall of Precautions/Restrictions: Impaired Precaution/Restrictions Comments: L hemiplegia Required Braces or Orthoses: Other Brace Other Brace: L AFO Restrictions Weight Bearing Restrictions Per Provider Order: No       Mobility Bed Mobility Overal bed mobility: Needs Assistance Bed Mobility: Supine to  Sit Rolling: Used rails, Contact guard assist   Supine to sit: HOB elevated, Used rails     General bed mobility comments: min cues for forward and lateral scoots    Transfers Overall transfer level: Needs assistance Equipment used: Hemi-walker Transfers: Sit to/from Stand, Bed to chair/wheelchair/BSC Sit to Stand: +2 physical assistance, +2 safety/equipment, Mod assist Stand pivot transfers: Mod assist         General transfer comment: increased time for L LE motor planning and foot placement     Balance Overall balance assessment: Needs assistance Sitting-balance support: Feet supported, Single extremity supported Sitting balance-Leahy Scale: Fair   Postural control: Posterior lean Standing balance support: Single extremity supported, Reliant on assistive device for balance, During functional activity Standing balance-Leahy Scale: Poor                             ADL either performed or assessed with clinical judgement   ADL Overall ADL's : Needs assistance/impaired Eating/Feeding: Set up Eating/Feeding Details (indicate cue type and reason): one handed techniques Grooming: Set up;Sitting;Wash/dry hands;Wash/dry face;Oral care;Brushing hair Grooming Details (indicate cue type and reason): uses L hand 20% of time as gross assist only Upper Body Bathing: Minimal assistance;Sitting Upper Body Bathing Details (indicate cue type and reason): EOB Lower Body Bathing: Maximal assistance;Sitting/lateral leans;Sit to/from stand   Upper Body Dressing : Minimal assistance;Sitting (EOB)   Lower Body Dressing: Maximal assistance;Sitting/lateral leans;Sit to/from stand (EOB including L AFO)   Toilet Transfer: Moderate assistance;+2 for physical assistance;+2 for safety/equipment;BSC/3in1 Toilet Transfer Details (indicate cue type and reason): use of hemi walker for SPT         Functional mobility during ADLs: Moderate assistance (hemi walker) General ADL  Comments:  educated for patient to have wife don L AFO bed level as pt reports wife has back issues    Extremity/Trunk Assessment Upper Extremity Assessment Upper Extremity Assessment: LUE deficits/detail LUE Deficits / Details: pt with hx CVA.  L UE non functional, increased tone. Pt reports he does perform ROM as well as the HHOT helps. patient noted to have one finger breath subluxation of this shoulder with education provided on protection of UE. patient reported having a splint at home but unclear if it is still there at this time. LUE:  (phone call x 2 to wife to ensure he does have his L resting hand splint or palm guard with messages left) LUE Coordination: decreased fine motor;decreased gross motor   Lower Extremity Assessment Lower Extremity Assessment: LLE deficits/detail;Defer to PT evaluation LLE Deficits / Details: appled L AFO for stability for transfers this session        Vision   Vision Assessment?: No apparent visual deficits   Perception Perception Perception: Within Functional Limits   Praxis Praxis Praxis: Impaired Praxis Impairment Details: Motor planning   Communication Communication Communication: Impaired Factors Affecting Communication: Hearing impaired   Cognition Arousal: Alert Behavior During Therapy: WFL for tasks assessed/performed Cognition: History of cognitive impairments             OT - Cognition Comments: mild processing delay and recall of details, can be impulsive at times                 Following commands: Intact        Cueing   Cueing Techniques: Verbal cues        General Comments mild DOE during bed mobility with effort    Pertinent Vitals/ Pain       Pain Assessment Pain Assessment: No/denies pain   Frequency  Min 2X/week        Progress Toward Goals  OT Goals(current goals can now be found in the care plan section)  Progress towards OT goals: Progressing toward goals  Acute Rehab OT Goals Patient Stated  Goal: to be able to assist his wife more OT Goal Formulation: With patient Time For Goal Achievement: 08/13/23 Potential to Achieve Goals: Fair ADL Goals Pt Will Perform Grooming: with set-up;sitting Pt Will Perform Lower Body Bathing: sit to/from stand;sitting/lateral leans;with mod assist Pt Will Perform Lower Body Dressing: with contact guard assist;sitting/lateral leans;sit to/from stand Pt Will Transfer to Toilet: with min assist;stand pivot transfer;bedside commode Pt Will Perform Toileting - Clothing Manipulation and hygiene: with min assist;sit to/from stand   AM-PAC OT "6 Clicks" Daily Activity     Outcome Measure   Help from another person eating meals?: A Little Help from another person taking care of personal grooming?: A Little Help from another person toileting, which includes using toliet, bedpan, or urinal?: Total Help from another person bathing (including washing, rinsing, drying)?: A Lot Help from another person to put on and taking off regular upper body clothing?: A Lot Help from another person to put on and taking off regular lower body clothing?: A Lot 6 Click Score: 13    End of Session Equipment Utilized During Treatment: Gait belt (hemi walker)  OT Visit Diagnosis: Unsteadiness on feet (R26.81);Other abnormalities of gait and mobility (R26.89);Muscle weakness (generalized) (M62.81);Other symptoms and signs involving the nervous system (R29.898);Hemiplegia and hemiparesis Hemiplegia - Right/Left: Left Hemiplegia - dominant/non-dominant: Non-Dominant Hemiplegia - caused by: Other cerebrovascular disease   Activity Tolerance Patient tolerated treatment well   Patient Left  in chair;with call bell/phone within reach;with chair alarm set   Nurse Communication Mobility status;Other (comment) (transfer technique with NT)        Time: 1610-9604 OT Time Calculation (min): 34 min  Charges: OT General Charges $OT Visit: 1 Visit OT Treatments $Therapeutic  Activity: 23-37 mins  Sherlie Boyum OT/L Acute Rehabilitation Department  [phone]  08/01/2023, 10:37 AM

## 2023-08-01 NOTE — Progress Notes (Signed)
 PROGRESS NOTE    Mark [name]  WGN:[id number] DOB: 08/14/1945 DOA: 07/28/2023 PCP: Victorio Grave, MD   Brief Narrative:  78 y.o. male with medical history significant of prior DVT, prior seizure episodes as well as stroke with chronic left hemiparesis, neurogenic bladder does intermittent self cath at home presented with fevers and rigors with weakness and foul-smelling urine.  He was found to have UTI and started on IV antibiotics.  Assessment & Plan:   Sepsis: Present on admission UTI: Present on admission, possibly related to intermittent self-catheterization - Blood cultures negative so far.  Urine culture growing E. coli.  Rocephin  switched to cefadroxil  on 07/30/2023 -Hemodynamically improving.  Off IV fluids.  No temperature spikes over the last few days  AKI on CKD stage IIIa - Creatinine 1.68 on presentation; baseline creatinine of 1.3-1.4.  Treated with IV fluids and creatinine has improved to 1.37 today as well.  Monitor intermittently  Leukocytosis - Improving.  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  Platelets improving to 95 today.  Monitor.  Has a IVIG and Nplate  as per oncology.  Also getting IV steroids.  Aspirin  discontinued.  Anemia of chronic disease -from chronic illnesses.  Hemoglobin currently stable.  Monitor intermittently  History of DVT - Xarelto  on hold.  Will resume only once cleared by oncology.  History of seizures - Continue Keppra   Hyperlipidemia -Continue statin  Depression - Continue Wellbutrin  and Zoloft   GERD - Continue PPI  Physical deconditioning - PT recommended home health PT  DVT prophylaxis: SCDs Code Status: DNR Family Communication: None at bedside Disposition Plan: Status is: Inpatient Remains inpatient appropriate because: Of severity of illness    Consultants: Oncology  Procedures: None  Antimicrobials:  Anti-infectives (From admission, onward)    Start     Dose/Rate Route Frequency  Ordered Stop   07/30/23 1200  cefadroxil  (DURICEF) capsule 1,000 mg        1,000 mg Oral 2 times daily 07/30/23 1123 08/04/23 0959   07/29/23 1500  cefTRIAXone  (ROCEPHIN ) 1 g in sodium chloride  0.9 % 100 mL IVPB  Status:  Discontinued        1 g 200 mL/hr over 30 Minutes Intravenous Every 24 hours 07/28/23 1705 07/30/23 1123   07/28/23 1445  cefTRIAXone  (ROCEPHIN ) 1 g in sodium chloride  0.9 % 100 mL IVPB        1 g 200 mL/hr over 30 Minutes Intravenous  Once 07/28/23 1444 07/28/23 1617   07/28/23 0000  cefpodoxime  (VANTIN ) 200 MG tablet  Status:  Discontinued        200 mg Oral 2 times daily 07/28/23 1445 07/28/23         Subjective: Patient seen and examined at bedside.  No fever, vomiting or chest pain or shortness of breath reported  objective: Vitals:   07/31/23 0631 07/31/23 1404 07/31/23 2105 08/01/23 0507  BP: (!) 145/78 124/69 120/74 135/72  Pulse: 63 70 69 64  Resp: 20 16 16 16   Temp: 98.6 F (37 C) 98.9 F (37.2 C) 98.6 F (37 C) 98.6 F (37 C)  TempSrc: Oral Oral Oral Oral  SpO2: 91% 95% 94% 95%  Weight:      Height:        Intake/Output Summary (Last 24 hours) at 08/01/2023 0819 Last data filed at 08/01/2023 0337 Gross per 24 hour  Intake 2083 ml  Output 1550 ml  Net 533 ml   Filed Weights   07/28/23 1130  Weight: 90.6 kg  Examination:  General: No acute distress.  Currently on room air.  Chronically ill and deconditioned looking. ENT/neck: No JVD elevation or palpable neck masses noted respiratory: Bilateral decreased breath sounds at bases with scattered crackles CVS: Rate mostly controlled; S1 and S2 heard  abdominal: Soft, nontender, distended mildly; no organomegaly,  bowel sounds are heard normally Extremities: No clubbing; mild lower extremity edema present CNS: Alert and oriented.  Left-sided hemiparesis present.  Remains slow to respond.   Lymph: No palpable lymphadenopathy Skin: No obvious petechia/lesions psych: Not agitated.  Flat  affect mostly.   Musculoskeletal: No obvious joint tenderness/erythema    Data Reviewed: I have personally reviewed following labs and imaging studies  CBC: Recent Labs  Lab 07/28/23 1152 07/29/23 0654 07/30/23 0552 07/31/23 0713 08/01/23 0539  WBC 23.9* 13.7* 14.5* 15.5* 11.6*  NEUTROABS 16.6*  --   --   --   --   HGB 12.0* 10.2* 10.3* 9.8* 9.9*  HCT 38.5* 33.0* 33.2* 32.3* 32.7*  MCV 95.3 96.2 96.5 98.5 97.0  PLT 133* 92* 70* 80* 95*   Basic Metabolic Panel: Recent Labs  Lab 07/28/23 1152 07/29/23 0654 07/30/23 0552 07/31/23 0713 08/01/23 0539  NA 136 137 137 136 138  K 4.2 4.1 3.8 3.7 3.3*  CL 105 106 107 107 110  CO2 22 23 21* 22 23  GLUCOSE 107* 96 155* 139* 110*  BUN 29* 22 25* 29* 27*  CREATININE 1.68* 1.37* 1.37* 1.29* 1.37*  CALCIUM  9.3 8.7* 9.0 8.6* 8.2*  MG  --   --   --  2.3 2.0   GFR: Estimated Creatinine Clearance: 51.2 mL/min (A) (by C-G formula based on SCr of 1.37 mg/dL (H)). Liver Function Tests: Recent Labs  Lab 07/28/23 1152  AST 35  ALT 31  ALKPHOS 68  BILITOT 0.4  PROT 7.9  ALBUMIN 3.6   No results for input(s): "LIPASE", "AMYLASE" in the last 168 hours. No results for input(s): "AMMONIA" in the last 168 hours. Coagulation Profile: Recent Labs  Lab 07/28/23 1219 07/29/23 0654  INR 1.8* 1.3*   Cardiac Enzymes: No results for input(s): "CKTOTAL", "CKMB", "CKMBINDEX", "TROPONINI" in the last 168 hours. BNP (last 3 results) No results for input(s): "PROBNP" in the last 8760 hours. HbA1C: No results for input(s): "HGBA1C" in the last 72 hours. CBG: No results for input(s): "GLUCAP" in the last 168 hours. Lipid Profile: No results for input(s): "CHOL", "HDL", "LDLCALC", "TRIG", "CHOLHDL", "LDLDIRECT" in the last 72 hours. Thyroid  Function Tests: No results for input(s): "TSH", "T4TOTAL", "FREET4", "T3FREE", "THYROIDAB" in the last 72 hours. Anemia Panel: No results for input(s): "VITAMINB12", "FOLATE", "FERRITIN", "TIBC",  "IRON", "RETICCTPCT" in the last 72 hours. Sepsis Labs: Recent Labs  Lab 07/28/23 1328  LATICACIDVEN 1.4    Recent Results (from the past 240 hours)  Blood Culture (routine x 2)     Status: None (Preliminary result)   Collection Time: 07/28/23 11:52 AM   Specimen: BLOOD RIGHT FOREARM  Result Value Ref Range Status   Specimen Description   Final    BLOOD RIGHT FOREARM Performed at [facility], [address] [place], [place] [address]    Special Requests   Final    BOTTLES DRAWN AEROBIC AND ANAEROBIC Blood Culture results may not be optimal due to an inadequate volume of blood received in culture bottles Performed at [facility], [address], [place], [place] [address]    Culture   Final    NO GROWTH 3  DAYS Performed at [facility], [address] [place], [place] [address]    Report Status PENDING  Incomplete  Blood Culture (routine x 2)     Status: None (Preliminary result)   Collection Time: 07/28/23 12:19 PM   Specimen: BLOOD RIGHT HAND  Result Value Ref Range Status   Specimen Description   Final    BLOOD RIGHT HAND Performed at [facility], [address], [place], [place] [address]    Special Requests   Final    BOTTLES DRAWN AEROBIC AND ANAEROBIC Blood Culture adequate volume Performed at [facility], [address], [place], [place] [address]    Culture   Final    NO GROWTH 3 DAYS Performed at [facility], [address] [place], [place] [address]    Report Status PENDING  Incomplete  Urine Culture     Status: Abnormal   Collection Time: 07/28/23  1:35 PM   Specimen: Urine, Random  Result Value Ref Range Status   Specimen Description   Final    URINE, RANDOM Performed at [facility], [address] [place], [place] [address]    Special Requests   Final    NONE Reflexed from [id number] Performed at [facility], [address] [place], [place] [address]    Culture >=100,000 COLONIES/mL ESCHERICHIA COLI (A)  Final   Report Status 07/30/2023 FINAL  Final   Organism ID, Bacteria ESCHERICHIA COLI (A)  Final      Susceptibility   Escherichia coli - MIC*    AMPICILLIN 4 SENSITIVE Sensitive     CEFAZOLIN <=4 SENSITIVE Sensitive     CEFEPIME <=0.12 SENSITIVE Sensitive     CEFTRIAXONE  <=0.25 SENSITIVE Sensitive     CIPROFLOXACIN  <=0.25 SENSITIVE Sensitive     GENTAMICIN  <=1 SENSITIVE Sensitive     IMIPENEM <=0.25 SENSITIVE Sensitive     NITROFURANTOIN <=16 SENSITIVE Sensitive     TRIMETH/SULFA <=20 SENSITIVE Sensitive     AMPICILLIN/SULBACTAM <=2 SENSITIVE Sensitive     PIP/TAZO <=4 SENSITIVE Sensitive ug/mL    * >=100,000 COLONIES/mL ESCHERICHIA COLI         Radiology Studies: No results found.       Scheduled Meds:  baclofen   10 mg Oral BID   buPROPion   150 mg Oral Daily   cefadroxil   1,000 mg Oral BID   Chlorhexidine  Gluconate Cloth  6 each Topical Daily   gabapentin   100 mg Oral TID   levETIRAcetam   1,500 mg Oral BID   melatonin  5 mg Oral QHS   pantoprazole   40 mg Oral Daily   rosuvastatin   40 mg Oral QHS   sertraline   100 mg Oral Daily   sodium chloride  flush  3 mL Intravenous Q12H   Continuous Infusions:  dexamethasone  (DECADRON ) IVPB ([facility])             Audria Leather, MD [facility] 08/01/2023, 8:19 AM

## 2023-08-02 ENCOUNTER — Inpatient Hospital Stay (HOSPITAL_COMMUNITY)

## 2023-08-02 DIAGNOSIS — D693 Immune thrombocytopenic purpura: Secondary | ICD-10-CM | POA: Diagnosis not present

## 2023-08-02 DIAGNOSIS — A419 Sepsis, unspecified organism: Secondary | ICD-10-CM | POA: Diagnosis not present

## 2023-08-02 LAB — CULTURE, BLOOD (ROUTINE X 2)
Culture: NO GROWTH
Culture: NO GROWTH
Special Requests: ADEQUATE

## 2023-08-02 LAB — MAGNESIUM: Magnesium: 2 mg/dL (ref 1.7–2.4)

## 2023-08-02 LAB — BASIC METABOLIC PANEL WITH GFR
Anion gap: 8 (ref 5–15)
BUN: 30 mg/dL — ABNORMAL HIGH (ref 8–23)
CO2: 23 mmol/L (ref 22–32)
Calcium: 8.6 mg/dL — ABNORMAL LOW (ref 8.9–10.3)
Chloride: 107 mmol/L (ref 98–111)
Creatinine, Ser: 1.27 mg/dL — ABNORMAL HIGH (ref 0.61–1.24)
GFR, Estimated: 58 mL/min — ABNORMAL LOW (ref >60)
Glucose, Bld: 130 mg/dL — ABNORMAL HIGH (ref 70–99)
Potassium: 3.4 mmol/L — ABNORMAL LOW (ref 3.5–5.1)
Sodium: 138 mmol/L (ref 135–145)

## 2023-08-02 MED ORDER — SACCHAROMYCES BOULARDII 250 MG PO CAPS
250.0000 mg | ORAL_CAPSULE | Freq: Two times a day (BID) | ORAL | Status: DC
Start: 2023-08-02 — End: 2023-08-06
  Administered 2023-08-02 – 2023-08-06 (×9): 250 mg via ORAL
  Filled 2023-08-02 (×9): qty 1

## 2023-08-02 MED ORDER — POTASSIUM CHLORIDE CRYS ER 20 MEQ PO TBCR
40.0000 meq | EXTENDED_RELEASE_TABLET | Freq: Once | ORAL | Status: AC
  Administered 2023-08-02: 40 meq via ORAL
  Filled 2023-08-02: qty 2

## 2023-08-02 NOTE — Progress Notes (Signed)
 Physical Therapy Treatment Patient Details Name: TALOR CHEEMA MRN: [id number] DOB: October 21, 1945 Today's Date: 08/02/2023   History of Present Illness 78 y.o. male presented with fevers and rigors with weakness and foul-smelling urine. He was found to have UTI and started on IV antibiotics. PMH: prior DVT, prior seizure episodes as well as stroke with chronic left hemiparesis, neurogenic bladder does intermittent self cath at home    PT Comments  Pt agreeable to working with therapy. Max A to don shoes + AFO. Mod A for mobility with +2 for safety/chair follow during ambulation. Pt tolerated activity well. Discussed d/c plan-pt remains agreeable to ST rehab. Will continue to follow and progress activity as tolerated. Patient will benefit from continued inpatient follow up therapy, <3 hours/day     If plan is discharge home, recommend the following: A lot of help with bathing/dressing/bathroom;Assistance with cooking/housework;Assist for transportation;Help with stairs or ramp for entrance;A lot of help with walking and/or transfers   Can travel by private vehicle        Equipment Recommendations  None recommended by PT    Recommendations for Other Services       Precautions / Restrictions Precautions Precautions: Fall Precaution/Restrictions Comments: L hemiplegia Required Braces or Orthoses: Other Brace Other Brace: L AFO Restrictions Weight Bearing Restrictions Per Provider Order: No     Mobility  Bed Mobility Overal bed mobility: Needs Assistance Bed Mobility: Supine to Sit     Supine to sit: HOB elevated, Used rails, Contact guard     General bed mobility comments: Increase time. Cues for safety.    Transfers Overall transfer level: Needs assistance Equipment used: Hemi-walker Transfers: Sit to/from Stand Sit to Stand: Mod assist, From elevated surface Stand pivot transfers: Mod assist         General transfer comment: increased time for L LE motor planning and  foot placement. Cues for safety. Assist to power up, stabilize, control descent. Increased assistance from low recliner.    Ambulation/Gait Ambulation/Gait assistance: Min assist, +2 safety/equipment Gait Distance (Feet): 40 Feet (10'x1; 40'x1) Assistive device: Hemi-walker Gait Pattern/deviations: Step-to pattern, Decreased weight shift to left, Decreased dorsiflexion - left, Narrow base of support       General Gait Details: significant lean of trunk to right posterior (weight over hemiwalker)- likely to compensate to advance L LE; min assist due to weakness and balance; followed with recliner and transported back to bedside. cues for proper placement of hemi-walker, step lengths, BOS.   Stairs             Wheelchair Mobility     Tilt Bed    Modified Rankin (Stroke Patients Only)       Balance Overall balance assessment: Needs assistance, History of Falls         Standing balance support: Single extremity supported, Reliant on assistive device for balance, During functional activity Standing balance-Leahy Scale: Poor                              Communication Communication Communication: Impaired Factors Affecting Communication: Hearing impaired  Cognition Arousal: Alert Behavior During Therapy: WFL for tasks assessed/performed   PT - Cognitive impairments: No apparent impairments                         Following commands: Intact      Cueing Cueing Techniques: Verbal cues  Exercises      General  Comments        Pertinent Vitals/Pain Pain Assessment Pain Assessment: No/denies pain    Home Living                          Prior Function            PT Goals (current goals can now be found in the care plan section) Progress towards PT goals: Progressing toward goals    Frequency    Min 3X/week      PT Plan      Co-evaluation              AM-PAC PT "6 Clicks" Mobility   Outcome Measure  Help  needed turning from your back to your side while in a flat bed without using bedrails?: A Little Help needed moving from lying on your back to sitting on the side of a flat bed without using bedrails?: A Lot Help needed moving to and from a bed to a chair (including a wheelchair)?: A Little Help needed standing up from a chair using your arms (e.g., wheelchair or bedside chair)?: A Lot Help needed to walk in hospital room?: A Lot Help needed climbing 3-5 steps with a railing? : Total 6 Click Score: 13    End of Session Equipment Utilized During Treatment: Gait belt Activity Tolerance: Patient tolerated treatment well Patient left: in chair;with call bell/phone within reach;with chair alarm set   PT Visit Diagnosis: Unsteadiness on feet (R26.81);Muscle weakness (generalized) (M62.81);Other abnormalities of gait and mobility (R26.89);Difficulty in walking, not elsewhere classified (R26.2);Other symptoms and signs involving the nervous system (R29.898) Hemiplegia - Right/Left: Left     Time: 9629-5284 PT Time Calculation (min) (ACUTE ONLY): 28 min  Charges:    $Gait Training: 23-37 mins PT General Charges $$ ACUTE PT VISIT: 1 Visit                         Tanda Falter, PT Acute Rehabilitation  Office: [phone]

## 2023-08-02 NOTE — TOC Progression Note (Signed)
 Transition of Care [facility]) - Progression Note    Patient Details  Name: Mark [name] MRN: [id number] Date of Birth: [birth year]  [facility] of [facility]) CM/SW Contact  Amaryllis Junior, [place] Phone Number: 08/02/2023, 3:36 PM  Clinical Narrative:    Pt recommended for SNF. FL2 completed and SNF referral faxed out. Awaiting bed offers.       Expected Discharge Plan and Services                                               Social Determinants of Health (SDOH) Interventions SDOH Screenings   Food Insecurity: No Food Insecurity (07/28/2023)  Housing: Low Risk  (07/28/2023)  Transportation Needs: No Transportation Needs (07/28/2023)  Utilities: Not At Risk (07/28/2023)  Social Connections: Socially Integrated (07/28/2023)  Tobacco Use: Medium Risk (07/28/2023)    Readmission Risk Interventions    08/02/2023    3:29 PM 07/09/2023   10:34 AM 07/06/2023    1:53 PM  Readmission Risk Prevention Plan  Post Dischage Appt  Complete   Medication Screening  Complete   Transportation Screening Complete Complete Complete  PCP or Specialist Appt within 5-7 Days   Complete  PCP or Specialist Appt within 3-5 Days Complete    Home Care Screening   Complete  Medication Review (RN CM)   Complete  HRI or Home Care Consult Complete    Social Work Consult for Recovery Care Planning/Counseling Complete    Palliative Care Screening Not Applicable    Medication Review Oceanographer) Complete

## 2023-08-02 NOTE — Progress Notes (Signed)
 I am still awaiting the labs for today.  They were not drawn when I saw him this morning.  Yesterday, his platelet count was 95,000.  He has a E. coli UTI.  This is pansensitive.  These are oral antibiotics right now.  He does have a little bit of loose stool.  I do not think it be a bad idea to put him on a probiotic.  He has low bit of abdominal distention.  He has some slightly decreased bowel sounds.  I think it be worthwhile getting a abdominal x-ray to make sure he is not developing an ileus.  He has had no fever.  He has had no bleeding.  He has had no cough or shortness of breath.  He is out of bed with assistance.  He does sit in a chair.  His vital signs are temperature 98.2.  Pulse 68.  Blood pressure 143/86.  His head and neck exam shows no oral lesions.  He has no adenopathy in the neck.  Lungs are clear bilaterally.  Cardiac exam regular rate and rhythm.  Abdomen is slightly distended.  Bowel sounds are decreased.  He has some hypertympany to percussion.  There is no palpable liver edge.  He has had a splenectomy.  Extremities shows the weakness over on the left side.  Skin exam shows no ecchymoses or petechia.  Neurological exam.  Shows the weakness over on the left side.  Hopefully, his platelet count will be up so he can go home.  I really do not see a reason why he cannot go home.  I would like to make sure that his abdominal x-ray does not show any ileus.  I will speak to his wife today.  I know that she is very concerned about him going home too soon.  Again, if his platelet count is over 100,000, I really think that he we will be able to go home with oral antibiotics.  I do not know if he needs any kind of long-term low-dose antibiotic for chronic suppression.  I know that he has had great care from everybody up on the floor.   Rayleen Cal, MD  Zoila Hines 28:6

## 2023-08-02 NOTE — Progress Notes (Addendum)
 PROGRESS NOTE    Mark [name]  ZOX:[id number] DOB: [birth year] DOA: 07/28/2023 PCP: Victorio Grave, MD   Brief Narrative:  78 y.o. male with medical history significant of prior DVT, prior seizure episodes as well as stroke with chronic left hemiparesis, neurogenic bladder does intermittent self cath at home presented with fevers and rigors with weakness and foul-smelling urine.  He was found to have UTI and started on IV antibiotics.  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  PT recommended SNF placement.  TOC consulted.  Assessment & Plan:   Sepsis: Present on admission UTI: Present on admission, possibly related to intermittent self-catheterization - Blood cultures negative so far.  Urine culture growing E. coli.  Rocephin  switched to cefadroxil  on 07/30/2023 -Hemodynamically improving.  Off IV fluids.  No temperature spikes over the last several days  AKI on CKD stage IIIa - Creatinine 1.68 on presentation; baseline creatinine of 1.3-1.4.  Treated with IV fluids and creatinine has improved to 1.27 today.  Monitor intermittently  Leukocytosis - Improving.  Labs pending today.  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  Platelets improving to 95 on 08/01/2023.  Labs pending today.  Monitor.  Has a IVIG and Nplate  as per oncology.  Also received IV steroids.  Aspirin  discontinued.  Hypokalemia - Replace.  Repeat a.m. labs  Anemia of chronic disease -from chronic illnesses.  Hemoglobin currently stable.  Monitor intermittently  History of DVT - Xarelto  has been resumed by oncology.  History of seizures - Continue Keppra   Hyperlipidemia -Continue statin  Depression - Continue Wellbutrin  and Zoloft   GERD - Continue PPI  Physical deconditioning - PT recommending SNF placement.  TOC consulted.  DVT prophylaxis: Xarelto  Code Status: DNR Family Communication: None at bedside Disposition  Plan: Status is: Inpatient Remains inpatient appropriate because: Of severity of illness.  Need for SNF placement.    Consultants: Oncology  Procedures: None  Antimicrobials:  Anti-infectives (From admission, onward)    Start     Dose/Rate Route Frequency Ordered Stop   07/30/23 1200  cefadroxil  (DURICEF) capsule 1,000 mg        1,000 mg Oral 2 times daily 07/30/23 1123 08/04/23 0959   07/29/23 1500  cefTRIAXone  (ROCEPHIN ) 1 g in sodium chloride  0.9 % 100 mL IVPB  Status:  Discontinued        1 g 200 mL/hr over 30 Minutes Intravenous Every 24 hours 07/28/23 1705 07/30/23 1123   07/28/23 1445  cefTRIAXone  (ROCEPHIN ) 1 g in sodium chloride  0.9 % 100 mL IVPB        1 g 200 mL/hr over 30 Minutes Intravenous  Once 07/28/23 1444 07/28/23 1617   07/28/23 0000  cefpodoxime  (VANTIN ) 200 MG tablet  Status:  Discontinued        200 mg Oral 2 times daily 07/28/23 1445 07/28/23         Subjective: Patient seen and examined at bedside.  Denies any bleeding, worsening shortness breath, fever or vomiting.   Objective: Vitals:   08/01/23 0507 08/01/23 1409 08/01/23 1950 08/02/23 0610  BP: 135/72 133/78 137/88 (!) 143/86  Pulse: 64 78 75 68  Resp: 16 16 16 16   Temp: 98.6 F (37 C) 97.7 F (36.5 C) 98.2 F (36.8 C) 98.2 F (36.8 C)  TempSrc: Oral Oral Oral Oral  SpO2: 95% 95% 96% 96%  Weight:      Height:        Intake/Output Summary (  Last 24 hours) at 08/02/2023 0952 Last data filed at 08/02/2023 0901 Gross per 24 hour  Intake 664 ml  Output 2025 ml  Net -1361 ml   Filed Weights   07/28/23 1130  Weight: 90.6 kg    Examination:  General: Remains on room air.  No distress.  Chronically ill and deconditioned looking. ENT/neck: No obvious thyromegaly or elevated JVD noted  respiratory: Decreased breath sounds at bases bilaterally with some crackles CVS: S1 S2 heard; rate currently controlled  abdominal: Soft, nontender, still slightly distended; no organomegaly, normal bowel  sounds heard Extremities: Mild lower extremity edema present; no cyanosis CNS: Awake and alert.  Left-sided hemiparesis present.  Lymph: No obvious palpable lymphadenopathy Skin: No obvious ecchymosis/lesions  psych: Has mostly flat affect with no signs of agitation.  Musculoskeletal: No obvious joint swelling/deformity  Data Reviewed: I have personally reviewed following labs and imaging studies  CBC: Recent Labs  Lab 07/28/23 1152 07/29/23 0654 07/30/23 0552 07/31/23 0713 08/01/23 0539  WBC 23.9* 13.7* 14.5* 15.5* 11.6*  NEUTROABS 16.6*  --   --   --   --   HGB 12.0* 10.2* 10.3* 9.8* 9.9*  HCT 38.5* 33.0* 33.2* 32.3* 32.7*  MCV 95.3 96.2 96.5 98.5 97.0  PLT 133* 92* 70* 80* 95*   Basic Metabolic Panel: Recent Labs  Lab 07/29/23 0654 07/30/23 0552 07/31/23 0713 08/01/23 0539 08/02/23 0749  NA 137 137 136 138 138  K 4.1 3.8 3.7 3.3* 3.4*  CL 106 107 107 110 107  CO2 23 21* 22 23 23   GLUCOSE 96 155* 139* 110* 130*  BUN 22 25* 29* 27* 30*  CREATININE 1.37* 1.37* 1.29* 1.37* 1.27*  CALCIUM  8.7* 9.0 8.6* 8.2* 8.6*  MG  --   --  2.3 2.0 2.0   GFR: Estimated Creatinine Clearance: 55.2 mL/min (A) (by C-G formula based on SCr of 1.27 mg/dL (H)). Liver Function Tests: Recent Labs  Lab 07/28/23 1152  AST 35  ALT 31  ALKPHOS 68  BILITOT 0.4  PROT 7.9  ALBUMIN 3.6   No results for input(s): "LIPASE", "AMYLASE" in the last 168 hours. No results for input(s): "AMMONIA" in the last 168 hours. Coagulation Profile: Recent Labs  Lab 07/28/23 1219 07/29/23 0654  INR 1.8* 1.3*   Cardiac Enzymes: No results for input(s): "CKTOTAL", "CKMB", "CKMBINDEX", "TROPONINI" in the last 168 hours. BNP (last 3 results) No results for input(s): "PROBNP" in the last 8760 hours. HbA1C: No results for input(s): "HGBA1C" in the last 72 hours. CBG: No results for input(s): "GLUCAP" in the last 168 hours. Lipid Profile: No results for input(s): "CHOL", "HDL", "LDLCALC", "TRIG",  "CHOLHDL", "LDLDIRECT" in the last 72 hours. Thyroid  Function Tests: No results for input(s): "TSH", "T4TOTAL", "FREET4", "T3FREE", "THYROIDAB" in the last 72 hours. Anemia Panel: No results for input(s): "VITAMINB12", "FOLATE", "FERRITIN", "TIBC", "IRON", "RETICCTPCT" in the last 72 hours. Sepsis Labs: Recent Labs  Lab 07/28/23 1328  LATICACIDVEN 1.4    Recent Results (from the past 240 hours)  Blood Culture (routine x 2)     Status: None   Collection Time: 07/28/23 11:52 AM   Specimen: BLOOD RIGHT FOREARM  Result Value Ref Range Status   Specimen Description   Final    BLOOD RIGHT FOREARM Performed at [facility], [address], [place], [place] [address]    Special Requests   Final    BOTTLES DRAWN AEROBIC AND ANAEROBIC Blood Culture results may not be optimal due to an inadequate volume  of blood received in culture bottles Performed at [facility], [address], [place], [place] [address]    Culture   Final    NO GROWTH 5 DAYS Performed at [facility], [address] [place], [place] [address]    Report Status 08/02/2023 FINAL  Final  Blood Culture (routine x 2)     Status: None   Collection Time: 07/28/23 12:19 PM   Specimen: BLOOD RIGHT HAND  Result Value Ref Range Status   Specimen Description   Final    BLOOD RIGHT HAND Performed at [facility], [address], [place], [place] [address]    Special Requests   Final    BOTTLES DRAWN AEROBIC AND ANAEROBIC Blood Culture adequate volume Performed at [facility], [address], [place], [place] [address]    Culture   Final    NO GROWTH 5 DAYS Performed at [facility], [address], [place], [place] [address]    Report Status 08/02/2023 FINAL  Final  Urine Culture     Status: Abnormal   Collection Time: 07/28/23  1:35 PM   Specimen: Urine, Random  Result Value Ref Range Status   Specimen Description   Final     URINE, RANDOM Performed at [facility], [address] [place], [place] [address]    Special Requests   Final    NONE Reflexed from [id number] Performed at [facility], [address], [place], [place] [address]    Culture >=100,000 COLONIES/mL ESCHERICHIA COLI (A)  Final   Report Status 07/30/2023 FINAL  Final   Organism ID, Bacteria ESCHERICHIA COLI (A)  Final      Susceptibility   Escherichia coli - MIC*    AMPICILLIN 4 SENSITIVE Sensitive     CEFAZOLIN <=4 SENSITIVE Sensitive     CEFEPIME <=0.12 SENSITIVE Sensitive     CEFTRIAXONE  <=0.25 SENSITIVE Sensitive     CIPROFLOXACIN  <=0.25 SENSITIVE Sensitive     GENTAMICIN  <=1 SENSITIVE Sensitive     IMIPENEM <=0.25 SENSITIVE Sensitive     NITROFURANTOIN <=16 SENSITIVE Sensitive     TRIMETH/SULFA <=20 SENSITIVE Sensitive     AMPICILLIN/SULBACTAM <=2 SENSITIVE Sensitive     PIP/TAZO <=4 SENSITIVE Sensitive ug/mL    * >=100,000 COLONIES/mL ESCHERICHIA COLI         Radiology Studies: No results found.       Scheduled Meds:  baclofen   10 mg Oral BID   buPROPion   150 mg Oral Daily   cefadroxil   1,000 mg Oral BID   Chlorhexidine  Gluconate Cloth  6 each Topical Daily   gabapentin   100 mg Oral TID   levETIRAcetam   1,500 mg Oral BID   melatonin  5 mg Oral QHS   pantoprazole   40 mg Oral Daily   rivaroxaban   20 mg Oral Q supper   rosuvastatin   40 mg Oral QHS   saccharomyces boulardii  250 mg Oral BID   sertraline   100 mg Oral Daily   sodium chloride  flush  3 mL Intravenous Q12H   Continuous Infusions:           Audria Leather, MD [facility] 08/02/2023, 9:52 AM

## 2023-08-02 NOTE — Plan of Care (Signed)

## 2023-08-02 NOTE — NC FL2 (Signed)
 [place]  MEDICAID FL2 LEVEL OF CARE FORM     IDENTIFICATION  Patient Name: Mark [name] Birthdate: 1946/03/13 Sex: male Admission Date (Current Location): 07/28/2023  [facility] and [place] Number:  Producer, television/film/video and Address:  [facility],  [address] [place], [place] [address]      Provider Number: [id number]  Attending Physician Name and Address:  Audria Leather, MD  Relative Name and Phone Number:  Ike, Maragh (Spouse)  [phone] (Mobile)    Current Level of Care: Hospital Recommended Level of Care: Skilled Nursing Facility Prior Approval Number:    Date Approved/Denied:   PASRR Number: [id number]  Discharge Plan: SNF    Current Diagnoses: Patient Active Problem List   Diagnosis Date Noted   Hypotension 08/01/2023   Dyslipidemia 07/28/2023   Depression 07/28/2023   GERD (gastroesophageal reflux disease) 07/28/2023   Seizure (HCC) 07/28/2023   Hypokalemia 07/19/2023   Rash and nonspecific skin eruption 07/18/2023   Neuropathic pain 07/18/2023   Chronic ITP (idiopathic thrombocytopenia) (HCC) 07/17/2023   Prediabetes 07/09/2023   Debility 07/09/2023   Acute ITP (HCC) 07/04/2023   Thrombocytopenia (HCC) 06/10/2023   Idiopathic thrombocytopenic purpura (ITP) (HCC) 06/09/2023   Lower urinary tract infectious disease 07/04/2020   CKD (chronic kidney disease), stage III (HCC) 07/01/2020   Neurogenic bladder 07/01/2020   Pulmonary nodule 10/24/2016   Acute hypoxemic respiratory failure (HCC) 10/21/2016   Sepsis (HCC) 10/21/2016   DVT (deep venous thrombosis) (HCC) 10/12/2016   Supratherapeutic INR 10/12/2016   Acute deep vein thrombosis (DVT) of popliteal vein of right lower extremity (HCC) 10/12/2016   AKI (acute kidney injury) (HCC) 10/12/2016   Fever 10/12/2016   Leukocytosis 10/12/2016   Bacteriuria 10/12/2016   Deep vein thrombosis (DVT) (HCC) 03/02/2015   Lupus anticoagulant disorder (HCC) 03/02/2015   Ischemic  stroke (HCC) 10/04/2014   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   Deep vein thrombosis (HCC) 10/04/2014   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   LA (lupus anticoagulant) disorder (HCC) 10/04/2014   Arteriosclerosis of coronary artery 09/10/2012   Apnea, sleep 09/10/2012   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   Dermatophytic onychia 01/29/2012   Spastic hemiplegia (HCC) 12/03/2011   Hemiparesis, left (HCC) 10/31/2011   Dysphonia 08/13/2011   Idiopathic thrombocytopenic purpura (HCC) 08/12/2011   Idiopathic thrombocytopenic purpura (HCC) 08/12/2011    Orientation RESPIRATION BLADDER Height & Weight     Self, Situation, Place  Normal Incontinent Weight: 199 lb 11.8 oz (90.6 kg) Height:  5\' 11"  (180.3 cm)  BEHAVIORAL SYMPTOMS/MOOD NEUROLOGICAL BOWEL NUTRITION STATUS      Continent Diet (regular)  AMBULATORY STATUS COMMUNICATION OF NEEDS Skin   Limited Assist Verbally Normal                       Personal Care Assistance Level of Assistance  Bathing, Feeding Bathing Assistance: Limited assistance Feeding assistance: Independent       Functional Limitations Info  Speech, Hearing, Sight Sight Info: Adequate Hearing Info: Impaired Speech Info: Adequate    SPECIAL CARE FACTORS FREQUENCY  PT (By licensed PT), OT (By licensed OT)     PT Frequency: 5x/wk OT Frequency: 5x/wk            Contractures Contractures Info: Present    Additional Factors Info  Code Status, Allergies Code Status Info: DNR Allergies Info: Diphenhydramine   Famotidine   Beef-derived Drug Products  Chicken Protein  Pork-derived Products           Current Medications (08/02/2023):  This is the current hospital active medication list Current Facility-Administered Medications  Medication Dose Route Frequency Provider Last Rate Last Admin   acetaminophen  (TYLENOL ) tablet 650 mg  650 mg Oral Q6H PRN Bennie Brave, MD       Or   acetaminophen  (TYLENOL ) suppository 650 mg  650 mg Rectal Q6H  PRN Bennie Brave, MD       baclofen  (LIORESAL ) tablet 10 mg  10 mg Oral BID Goel, Hersh, MD   10 mg at 08/02/23 0957   buPROPion  (WELLBUTRIN  XL) 24 hr tablet 150 mg  150 mg Oral Daily Goel, Hersh, MD   150 mg at 08/02/23 0957   cefadroxil  (DURICEF) capsule 1,000 mg  1,000 mg Oral BID Audria Leather, MD   1,000 mg at 08/02/23 1610   Chlorhexidine  Gluconate Cloth 2 % PADS 6 each  6 each Topical Daily Darus Engels A, DO   6 each at 08/02/23 1001   gabapentin  (NEURONTIN ) capsule 100 mg  100 mg Oral TID Goel, Hersh, MD   100 mg at 08/02/23 0957   guaiFENesin -dextromethorphan (ROBITUSSIN DM) 100-10 MG/5ML syrup 5 mL  5 mL Oral Q4H PRN Audria Leather, MD       levETIRAcetam  (KEPPRA ) tablet 1,500 mg  1,500 mg Oral BID Goel, Hersh, MD   1,500 mg at 08/02/23 0957   melatonin tablet 5 mg  5 mg Oral QHS Goel, Hersh, MD   5 mg at 08/01/23 2140   pantoprazole  (PROTONIX ) EC tablet 40 mg  40 mg Oral Daily Goel, Hersh, MD   40 mg at 08/02/23 0956   polyethylene glycol (MIRALAX  / GLYCOLAX ) packet 17 g  17 g Oral Daily PRN Goel, Hersh, MD       polyvinyl alcohol  (LIQUIFILM TEARS) 1.4 % ophthalmic solution 1 drop  1 drop Both Eyes PRN Goel, Hersh, MD       rivaroxaban  (XARELTO ) tablet 20 mg  20 mg Oral Q supper Ennever, Peter R, MD   20 mg at 08/01/23 1649   rosuvastatin  (CRESTOR ) tablet 40 mg  40 mg Oral QHS Goel, Hersh, MD   40 mg at 08/01/23 2140   saccharomyces boulardii (FLORASTOR) capsule 250 mg  250 mg Oral BID Ennever, Peter R, MD   250 mg at 08/02/23 9604   sertraline  (ZOLOFT ) tablet 100 mg  100 mg Oral Daily Goel, Hersh, MD   100 mg at 08/02/23 0957   sodium chloride  flush (NS) 0.9 % injection 3 mL  3 mL Intravenous Q12H Bennie Brave, MD   3 mL at 08/02/23 5409     Discharge Medications: Please see discharge summary for a list of discharge medications.  Relevant Imaging Results:  Relevant Lab Results:   Additional Information SSN [id number]  Amaryllis Junior, LCSW

## 2023-08-03 DIAGNOSIS — A419 Sepsis, unspecified organism: Secondary | ICD-10-CM | POA: Diagnosis not present

## 2023-08-03 LAB — COMPREHENSIVE METABOLIC PANEL WITH GFR
ALT: 25 U/L (ref 0–44)
AST: 24 U/L (ref 15–41)
Albumin: 2.8 g/dL — ABNORMAL LOW (ref 3.5–5.0)
Alkaline Phosphatase: 48 U/L (ref 38–126)
Anion gap: 6 (ref 5–15)
BUN: 31 mg/dL — ABNORMAL HIGH (ref 8–23)
CO2: 23 mmol/L (ref 22–32)
Calcium: 8.3 mg/dL — ABNORMAL LOW (ref 8.9–10.3)
Chloride: 108 mmol/L (ref 98–111)
Creatinine, Ser: 1.15 mg/dL (ref 0.61–1.24)
GFR, Estimated: >60 mL/min (ref >60)
Glucose, Bld: 98 mg/dL (ref 70–99)
Potassium: 3.8 mmol/L (ref 3.5–5.1)
Sodium: 137 mmol/L (ref 135–145)
Total Bilirubin: 0.4 mg/dL (ref 0.0–1.2)
Total Protein: 7.8 g/dL (ref 6.5–8.1)

## 2023-08-03 LAB — CBC
HCT: 34 % — ABNORMAL LOW (ref 39.0–52.0)
Hemoglobin: 10.7 g/dL — ABNORMAL LOW (ref 13.0–17.0)
MCH: 29.9 pg (ref 26.0–34.0)
MCHC: 31.5 g/dL (ref 30.0–36.0)
MCV: 95 fL (ref 80.0–100.0)
Platelets: 196 10*3/uL (ref 150–400)
RBC: 3.58 MIL/uL — ABNORMAL LOW (ref 4.22–5.81)
RDW: 17.2 % — ABNORMAL HIGH (ref 11.5–15.5)
WBC: 13 10*3/uL — ABNORMAL HIGH (ref 4.0–10.5)
nRBC: 2.1 % — ABNORMAL HIGH (ref 0.0–0.2)

## 2023-08-03 LAB — MAGNESIUM: Magnesium: 2.1 mg/dL (ref 1.7–2.4)

## 2023-08-03 NOTE — Progress Notes (Signed)
 PROGRESS NOTE    Mark [name]  XWR:[id number] DOB: 28-Apr-1945 DOA: 07/28/2023 PCP: Victorio Grave, MD   Brief Narrative:  78 y.o. male with medical history significant of prior DVT, prior seizure episodes as well as stroke with chronic left hemiparesis, neurogenic bladder does intermittent self cath at home presented with fevers and rigors with weakness and foul-smelling urine.  He was found to have UTI and started on IV antibiotics.  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  PT recommended SNF placement.  TOC consulted.  Assessment & Plan:   Sepsis: Present on admission UTI: Present on admission, possibly related to intermittent self-catheterization - Blood cultures negative so far.  Urine culture growing E. coli.  Rocephin  switched to cefadroxil  on 07/30/2023 -Hemodynamically improving.  Off IV fluids.  No temperature spikes over the last several days  AKI on CKD stage IIIa - Creatinine 1.68 on presentation; baseline creatinine of 1.3-1.4.  Treated with IV fluids and creatinine has improved to 1.15 today.  Monitor intermittently  Leukocytosis - Mild.  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  Platelets have improved to 196 today.  Received IVIG, Decadron  and Nplate  as per oncology. Aspirin  discontinued.  Hypokalemia - Improved  Anemia of chronic disease -from chronic illnesses.  Hemoglobin currently stable.  Monitor intermittently  History of DVT - Xarelto  has been resumed by oncology.  History of seizures - Continue Keppra   Hyperlipidemia -Continue statin  Depression - Continue Wellbutrin  and Zoloft   GERD - Continue PPI  Physical deconditioning - PT recommending SNF placement.  TOC consulted.  DVT prophylaxis: Xarelto  Code Status: DNR Family Communication: None at bedside Disposition Plan: Status is: Inpatient Remains inpatient appropriate because: Of severity of  illness.  Need for SNF placement.  Currently medically stable for discharge to SNF     Consultants: Oncology  Procedures: None  Antimicrobials:  Anti-infectives (From admission, onward)    Start     Dose/Rate Route Frequency Ordered Stop   07/30/23 1200  cefadroxil  (DURICEF) capsule 1,000 mg        1,000 mg Oral 2 times daily 07/30/23 1123 08/04/23 0959   07/29/23 1500  cefTRIAXone  (ROCEPHIN ) 1 g in sodium chloride  0.9 % 100 mL IVPB  Status:  Discontinued        1 g 200 mL/hr over 30 Minutes Intravenous Every 24 hours 07/28/23 1705 07/30/23 1123   07/28/23 1445  cefTRIAXone  (ROCEPHIN ) 1 g in sodium chloride  0.9 % 100 mL IVPB        1 g 200 mL/hr over 30 Minutes Intravenous  Once 07/28/23 1444 07/28/23 1617   07/28/23 0000  cefpodoxime  (VANTIN ) 200 MG tablet  Status:  Discontinued        200 mg Oral 2 times daily 07/28/23 1445 07/28/23         Subjective: Patient seen and examined at bedside.  Continues to feel weak.  No fever, vomiting, chest pain reported.   Objective: Vitals:   08/02/23 0610 08/02/23 1401 08/02/23 2012 08/03/23 0528  BP: (!) 143/86 137/77 (!) 141/84 (!) 146/93  Pulse: 68 71 72 62  Resp: 16 16 18 18   Temp: 98.2 F (36.8 C) 98.7 F (37.1 C) 98.6 F (37 C) 97.9 F (36.6 C)  TempSrc: Oral Oral Oral Oral  SpO2: 96% 93% 96% 97%  Weight:      Height:        Intake/Output Summary (Last 24 hours) at 08/03/2023  0827 Last data filed at 08/03/2023 0500 Gross per 24 hour  Intake 480 ml  Output 2650 ml  Net -2170 ml   Filed Weights   07/28/23 1130  Weight: 90.6 kg    Examination:  General: No acute distress.  On room air currently.  Chronically ill and deconditioned looking. ENT/neck: No obvious JVD elevation or palpable neck masses noted respiratory: Bilateral decreased breath sounds at bases with scattered crackles  CVS: Mostly rate controlled; S1 and S2 are heard abdominal: Soft, nontender, distended mildly; no organomegaly, bowel sounds are  normally heard  extremities: No clubbing; trace lower extremity edema present CNS: Alert and oriented.  Left-sided hemiparesis present.  Lymph: No cervical lymphadenopathy Skin: No obvious petechia/rashes   psych: Flat affect.  Currently not agitated.   Musculoskeletal: No obvious joint tenderness/erythema  Data Reviewed: I have personally reviewed following labs and imaging studies  CBC: Recent Labs  Lab 07/28/23 1152 07/29/23 0654 07/30/23 0552 07/31/23 0713 08/01/23 0539 08/03/23 0704  WBC 23.9* 13.7* 14.5* 15.5* 11.6* 13.0*  NEUTROABS 16.6*  --   --   --   --   --   HGB 12.0* 10.2* 10.3* 9.8* 9.9* 10.7*  HCT 38.5* 33.0* 33.2* 32.3* 32.7* 34.0*  MCV 95.3 96.2 96.5 98.5 97.0 95.0  PLT 133* 92* 70* 80* 95* 196   Basic Metabolic Panel: Recent Labs  Lab 07/30/23 0552 07/31/23 0713 08/01/23 0539 08/02/23 0749 08/03/23 0704  NA 137 136 138 138 137  K 3.8 3.7 3.3* 3.4* 3.8  CL 107 107 110 107 108  CO2 21* 22 23 23 23   GLUCOSE 155* 139* 110* 130* 98  BUN 25* 29* 27* 30* 31*  CREATININE 1.37* 1.29* 1.37* 1.27* 1.15  CALCIUM  9.0 8.6* 8.2* 8.6* 8.3*  MG  --  2.3 2.0 2.0 2.1   GFR: Estimated Creatinine Clearance: 61 mL/min (by C-G formula based on SCr of 1.15 mg/dL). Liver Function Tests: Recent Labs  Lab 07/28/23 1152 08/03/23 0704  AST 35 24  ALT 31 25  ALKPHOS 68 48  BILITOT 0.4 0.4  PROT 7.9 7.8  ALBUMIN 3.6 2.8*   No results for input(s): "LIPASE", "AMYLASE" in the last 168 hours. No results for input(s): "AMMONIA" in the last 168 hours. Coagulation Profile: Recent Labs  Lab 07/28/23 1219 07/29/23 0654  INR 1.8* 1.3*   Cardiac Enzymes: No results for input(s): "CKTOTAL", "CKMB", "CKMBINDEX", "TROPONINI" in the last 168 hours. BNP (last 3 results) No results for input(s): "PROBNP" in the last 8760 hours. HbA1C: No results for input(s): "HGBA1C" in the last 72 hours. CBG: No results for input(s): "GLUCAP" in the last 168 hours. Lipid Profile: No  results for input(s): "CHOL", "HDL", "LDLCALC", "TRIG", "CHOLHDL", "LDLDIRECT" in the last 72 hours. Thyroid  Function Tests: No results for input(s): "TSH", "T4TOTAL", "FREET4", "T3FREE", "THYROIDAB" in the last 72 hours. Anemia Panel: No results for input(s): "VITAMINB12", "FOLATE", "FERRITIN", "TIBC", "IRON", "RETICCTPCT" in the last 72 hours. Sepsis Labs: Recent Labs  Lab 07/28/23 1328  LATICACIDVEN 1.4    Recent Results (from the past 240 hours)  Blood Culture (routine x 2)     Status: None   Collection Time: 07/28/23 11:52 AM   Specimen: BLOOD RIGHT FOREARM  Result Value Ref Range Status   Specimen Description   Final    BLOOD RIGHT FOREARM Performed at [facility], [address], [place], [place] [address]    Special Requests   Final    BOTTLES DRAWN AEROBIC AND ANAEROBIC  Blood Culture results may not be optimal due to an inadequate volume of blood received in culture bottles Performed at [facility], [address] [place], [place] [address]    Culture   Final    NO GROWTH 5 DAYS Performed at [facility], [address], [place], [place] [address]    Report Status 08/02/2023 FINAL  Final  Blood Culture (routine x 2)     Status: None   Collection Time: 07/28/23 12:19 PM   Specimen: BLOOD RIGHT HAND  Result Value Ref Range Status   Specimen Description   Final    BLOOD RIGHT HAND Performed at [facility], [address], [place], [place] [address]    Special Requests   Final    BOTTLES DRAWN AEROBIC AND ANAEROBIC Blood Culture adequate volume Performed at [facility], [address], [place], [place] [address]    Culture   Final    NO GROWTH 5 DAYS Performed at [facility], [address] [place], [place] [address]    Report Status 08/02/2023 FINAL  Final  Urine Culture     Status: Abnormal   Collection Time: 07/28/23  1:35 PM   Specimen: Urine, Random  Result Value  Ref Range Status   Specimen Description   Final    URINE, RANDOM Performed at [facility], [address], [place], [place] [address]    Special Requests   Final    NONE Reflexed from [id number] Performed at [facility], [address], [place], [place] [address]    Culture >=100,000 COLONIES/mL ESCHERICHIA COLI (A)  Final   Report Status 07/30/2023 FINAL  Final   Organism ID, Bacteria ESCHERICHIA COLI (A)  Final      Susceptibility   Escherichia coli - MIC*    AMPICILLIN 4 SENSITIVE Sensitive     CEFAZOLIN <=4 SENSITIVE Sensitive     CEFEPIME <=0.12 SENSITIVE Sensitive     CEFTRIAXONE  <=0.25 SENSITIVE Sensitive     CIPROFLOXACIN  <=0.25 SENSITIVE Sensitive     GENTAMICIN  <=1 SENSITIVE Sensitive     IMIPENEM <=0.25 SENSITIVE Sensitive     NITROFURANTOIN <=16 SENSITIVE Sensitive     TRIMETH/SULFA <=20 SENSITIVE Sensitive     AMPICILLIN/SULBACTAM <=2 SENSITIVE Sensitive     PIP/TAZO <=4 SENSITIVE Sensitive ug/mL    * >=100,000 COLONIES/mL ESCHERICHIA COLI         Radiology Studies: DG Abd 2 Views Result Date: 08/02/2023 CLINICAL DATA:  Thrombocytopenia. Complains of loose stools and abdominal tightness. EXAM: ABDOMEN - 2 VIEW COMPARISON:  None FINDINGS: Mild gaseous distension of the stomach. No dilated loops of small bowel. There is a moderate stool burden noted within the colon. No signs pneumoperitoneum. Severe degenerative changes involving the right hip. Surgical clips noted within the left upper quadrant and right upper quadrant of the abdomen. IMPRESSION: 1. No signs of bowel obstruction. 2. Moderate stool burden noted within the colon. Electronically Signed   By: Kimberley Penman M.D.   On: 08/02/2023 13:21         Scheduled Meds:  baclofen   10 mg Oral BID   buPROPion   150 mg Oral Daily   cefadroxil   1,000 mg Oral BID   Chlorhexidine  Gluconate Cloth  6 each Topical Daily   gabapentin   100 mg Oral TID   levETIRAcetam   1,500 mg Oral  BID   melatonin  5 mg Oral QHS   pantoprazole   40 mg Oral  Daily   rivaroxaban   20 mg Oral Q supper   rosuvastatin   40 mg Oral QHS   saccharomyces boulardii  250 mg Oral BID   sertraline   100 mg Oral Daily   sodium chloride  flush  3 mL Intravenous Q12H   Continuous Infusions:           Audria Leather, MD [facility] 08/03/2023, 8:27 AM

## 2023-08-04 ENCOUNTER — Encounter: Admitting: Physical Medicine and Rehabilitation

## 2023-08-04 DIAGNOSIS — R8271 Bacteriuria: Secondary | ICD-10-CM

## 2023-08-04 DIAGNOSIS — A419 Sepsis, unspecified organism: Secondary | ICD-10-CM | POA: Diagnosis not present

## 2023-08-04 LAB — COMPREHENSIVE METABOLIC PANEL WITH GFR
ALT: 25 U/L (ref 0–44)
AST: 24 U/L (ref 15–41)
Albumin: 2.8 g/dL — ABNORMAL LOW (ref 3.5–5.0)
Alkaline Phosphatase: 46 U/L (ref 38–126)
Anion gap: 6 (ref 5–15)
BUN: 37 mg/dL — ABNORMAL HIGH (ref 8–23)
CO2: 22 mmol/L (ref 22–32)
Calcium: 8.4 mg/dL — ABNORMAL LOW (ref 8.9–10.3)
Chloride: 110 mmol/L (ref 98–111)
Creatinine, Ser: 1.2 mg/dL (ref 0.61–1.24)
GFR, Estimated: >60 mL/min (ref >60)
Glucose, Bld: 107 mg/dL — ABNORMAL HIGH (ref 70–99)
Potassium: 3.7 mmol/L (ref 3.5–5.1)
Sodium: 138 mmol/L (ref 135–145)
Total Bilirubin: 0.5 mg/dL (ref 0.0–1.2)
Total Protein: 6.9 g/dL (ref 6.5–8.1)

## 2023-08-04 LAB — CBC
HCT: 33.6 % — ABNORMAL LOW (ref 39.0–52.0)
Hemoglobin: 10.4 g/dL — ABNORMAL LOW (ref 13.0–17.0)
MCH: 29.7 pg (ref 26.0–34.0)
MCHC: 31 g/dL (ref 30.0–36.0)
MCV: 96 fL (ref 80.0–100.0)
Platelets: 255 10*3/uL (ref 150–400)
RBC: 3.5 MIL/uL — ABNORMAL LOW (ref 4.22–5.81)
RDW: 17.6 % — ABNORMAL HIGH (ref 11.5–15.5)
WBC: 14.5 10*3/uL — ABNORMAL HIGH (ref 4.0–10.5)
nRBC: 3.5 % — ABNORMAL HIGH (ref 0.0–0.2)

## 2023-08-04 LAB — MAGNESIUM: Magnesium: 1.9 mg/dL (ref 1.7–2.4)

## 2023-08-04 NOTE — Plan of Care (Signed)

## 2023-08-04 NOTE — Progress Notes (Signed)
 Inpatient Rehab Admissions Coordinator:   Discussed with PT.  Pt progressing and min assist for up to 41' with a HW which is below where he was when he discharged CIR 2 weeks ago.  Admitted for UTI and ITP.  Received IVIG, Nplate , and IV steroids.  AKI on admit.  I will place a rehab consult order per protocol and we will f/u.   Loye Rumble, PT, DPT Admissions Coordinator [phone] 08/04/23  11:31 AM

## 2023-08-04 NOTE — Progress Notes (Signed)
 Physical Therapy Treatment Patient Details Name: Mark [name] MRN: [id number] DOB: [birth year] Today's Date: 08/04/2023   History of Present Illness 78 y.o. male presented with fevers and rigors with weakness and foul-smelling urine. He was found to have UTI and started on IV antibiotics. PMH: prior DVT, prior seizure episodes as well as stroke with chronic left hemiparesis, neurogenic bladder does intermittent self cath at home    PT Comments  Pt agreeable to therapy, progressing well. Pt attempts to don L AFO independently while seated EOB without success, experienced LOB with attempt, unable to slide foot in shoe with AFO while seated EOB due to increased LLE tone causing strong plantarflexion and adduction; total A to don AFO. Pt needing min A to power up from EOB, RUE assisting to power up, steadying support while transferring hand from bed to hemiwalker. Pt amb 15 ft, 40 ft and 30 ft with hemiwalker, min A for balance, recliner follow for seated rest breaks between each amb bout due to pt fatiguing. Pt with step-to gait pattern, narrow BOS, increased R trunk lean while advancing LLE, significantly decreased cadence, intermittent assist to manage hemiwalker while clearing obstacles in hallway and through doorframes to safely clear. Pt making progress with therapy; updated d/c rec to intensive inpatient follow-up therapy, >3 hours/day.    If plan is discharge home, recommend the following: Assistance with cooking/housework;Assist for transportation;Help with stairs or ramp for entrance;A little help with walking and/or transfers;A little help with bathing/dressing/bathroom   Can travel by private vehicle        Equipment Recommendations  None recommended by PT    Recommendations for Other Services       Precautions / Restrictions Precautions Precautions: Fall Precaution/Restrictions Comments: L hemiplegia Required Braces or Orthoses: Other Brace Other Brace: L  AFO Restrictions Weight Bearing Restrictions Per Provider Order: No     Mobility  Bed Mobility Overal bed mobility: Needs Assistance Bed Mobility: Supine to Sit     Supine to sit: Contact guard, HOB elevated, Used rails     General bed mobility comments: increased time and effort, pt hooking LLE with RLE to assist to EOB, strong use of bedrail with RUE to assist in uprighting trunk    Transfers Overall transfer level: Needs assistance Equipment used: Hemi-walker Transfers: Sit to/from Stand Sit to Stand: Min assist, From elevated surface           General transfer comment: min A to power up from elevated bed and steady, verbal cues for widening BOS and pt needing increased time to reposition; min A to power up from toilet with use of grab bar in bathroom, again cues for BOS and hemiwalker positioning to improve safety    Ambulation/Gait Ambulation/Gait assistance: Min assist Gait Distance (Feet):  (15', 40', 35') Assistive device: Hemi-walker Gait Pattern/deviations: Step-to pattern, Decreased weight shift to left, Decreased dorsiflexion - left, Narrow base of support Gait velocity: decreased     General Gait Details: step to gait pattern with trunk lean to R while advancing LLE, good hemiwalker placement, verbal cues for intermittent hemiwalker placement and path to safely navigate around obstacles at safe distance, recliner follow for seated rest breaks due to fatigue   Stairs             Wheelchair Mobility     Tilt Bed    Modified Rankin (Stroke Patients Only)       Balance Overall balance assessment: Needs assistance, History of Falls Sitting-balance support: Feet supported Sitting balance-Leahy  Scale: Fair     Standing balance support: Single extremity supported, Reliant on assistive device for balance, During functional activity Standing balance-Leahy Scale: Poor                              Communication  Communication Communication: Impaired Factors Affecting Communication: Hearing impaired (HOH, hearing aides charging during session)  Cognition Arousal: Alert Behavior During Therapy: WFL for tasks assessed/performed   PT - Cognitive impairments: No apparent impairments                         Following commands: Intact      Cueing Cueing Techniques: Verbal cues  Exercises      General Comments General comments (skin integrity, edema, etc.): pt on RA with SpO2 96% and HR77 post ambulation      Pertinent Vitals/Pain Pain Assessment Pain Assessment: Faces Faces Pain Scale: Hurts a little bit Pain Location: back with ambulation Pain Descriptors / Indicators: Sore Pain Intervention(s): Limited activity within patient's tolerance, Monitored during session, Repositioned    Home Living                          Prior Function            PT Goals (current goals can now be found in the care plan section) Acute Rehab PT Goals Patient Stated Goal: return home, get stronger PT Goal Formulation: With patient/family Time For Goal Achievement: 08/13/23 Potential to Achieve Goals: Good Progress towards PT goals: Progressing toward goals    Frequency    Min 3X/week      PT Plan      Co-evaluation              AM-PAC PT "6 Clicks" Mobility   Outcome Measure  Help needed turning from your back to your side while in a flat bed without using bedrails?: A Little Help needed moving from lying on your back to sitting on the side of a flat bed without using bedrails?: A Lot Help needed moving to and from a bed to a chair (including a wheelchair)?: A Little Help needed standing up from a chair using your arms (e.g., wheelchair or bedside chair)?: A Little Help needed to walk in hospital room?: A Little Help needed climbing 3-5 steps with a railing? : Total 6 Click Score: 15    End of Session Equipment Utilized During Treatment: Gait belt Activity  Tolerance: Patient tolerated treatment well Patient left: in chair;with call bell/phone within reach;with chair alarm set Nurse Communication: Mobility status PT Visit Diagnosis: Unsteadiness on feet (R26.81);Muscle weakness (generalized) (M62.81);Other abnormalities of gait and mobility (R26.89);Difficulty in walking, not elsewhere classified (R26.2);Other symptoms and signs involving the nervous system (R29.898) Hemiplegia - Right/Left: Left     Time: 0981-1914 PT Time Calculation (min) (ACUTE ONLY): 41 min  Charges:    $Gait Training: 23-37 mins $Therapeutic Activity: 8-22 mins PT General Charges $$ ACUTE PT VISIT: 1 Visit                     Tori Hettie Roselli PT, DPT 08/04/23, 11:35 AM

## 2023-08-04 NOTE — Progress Notes (Signed)
 The platelet counts come up quite nicely now.  I think today the platelet count is 255,000.  It looks like they are trying to find a skilled nursing facility for him.  I am little surprised by this.  He is still on antibiotic for the E. coli urinary tract infection.  I would have to think that this is probably going to be taken care of.  He might need to be on some kind of low-dose chronic suppressive therapy.  His appetite is doing okay.  He has had no problems with nausea or vomiting.  He is up sitting in the chair.  He does have the left-sided weakness.  There have been no bleeding.  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  His white cell count is 14.5.  Hemoglobin 10.4.  Platelet count 255,000.  His sodium 138.  Potassium 3.7.  BUN 37 creatinine 1.2.  Calcium  8.4.  Albumin 2.8.  His vital signs are stable.  Blood pressure 152/81.  Temperature is 97.9.  Pulse 65.  His lungs are clear bilaterally.  Cardiac exam regular rate rhythm.  Abdomen soft.  Bowel sounds are present.  There is no fluid wave.  Extremity shows weakness on the left side.  He has no swelling in the legs.  Neurological exam again shows weakness on the left side.   Mr. Balandran has had a response to the Decadron  and the IVIG.  I am not surprised.  He really needs to have Revlimid to try to keep him in remission.  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  There is no evidence of a accessory spleen.  Again they are going to try to find a skilled nursing facility for him.   We will continue to follow along.   Rayleen Cal, MD  1 Cor 15:58

## 2023-08-04 NOTE — Progress Notes (Signed)
 PROGRESS NOTE    Mark [name]  BJY:[id number] DOB: Feb 09, 1946 DOA: 07/28/2023 PCP: Victorio Grave, MD   Brief Narrative:  78 y.o. male with medical history significant of prior DVT, prior seizure episodes as well as stroke with chronic left hemiparesis, neurogenic bladder does intermittent self cath at home presented with fevers and rigors with weakness and foul-smelling urine.  He was found to have UTI and started on IV antibiotics.  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  PT recommended SNF placement.  TOC consulted.  Assessment & Plan:   Sepsis: Present on admission UTI: Present on admission, possibly related to intermittent self-catheterization - Blood cultures negative so far.  Urine culture growing E. coli.  Rocephin  switched to cefadroxil  on 07/30/2023 -Hemodynamically improving.  Off IV fluids.  No temperature spikes over the last several days  AKI on CKD stage IIIa - Creatinine 1.68 on presentation; baseline creatinine of 1.3-1.4.  Treated with IV fluids and creatinine has improved to 1.20 today.  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  Platelets have improved to 255 today.  Received IVIG, Decadron  and Nplate  as per oncology. Aspirin  discontinued, will resume on discharge.  Hypokalemia - Improved  Anemia of chronic disease -from chronic illnesses.  Hemoglobin currently stable.  Monitor intermittently  History of DVT - Xarelto  has been resumed by oncology.  History of seizures - Continue Keppra   Hyperlipidemia -Continue statin  Depression - Continue Wellbutrin  and Zoloft   GERD - Continue PPI  Physical deconditioning - PT recommending SNF placement.  TOC consulted.  DVT prophylaxis: Xarelto  Code Status: DNR Family Communication: None at bedside Disposition Plan: Status is: Inpatient Remains inpatient appropriate  because: Of severity of illness.  Need for SNF placement.  Currently medically stable for discharge to SNF     Consultants: Oncology  Procedures: None  Antimicrobials:  Anti-infectives (From admission, onward)    Start     Dose/Rate Route Frequency Ordered Stop   07/30/23 1200  cefadroxil  (DURICEF) capsule 1,000 mg        1,000 mg Oral 2 times daily 07/30/23 1123 08/03/23 2156   07/29/23 1500  cefTRIAXone  (ROCEPHIN ) 1 g in sodium chloride  0.9 % 100 mL IVPB  Status:  Discontinued        1 g 200 mL/hr over 30 Minutes Intravenous Every 24 hours 07/28/23 1705 07/30/23 1123   07/28/23 1445  cefTRIAXone  (ROCEPHIN ) 1 g in sodium chloride  0.9 % 100 mL IVPB        1 g 200 mL/hr over 30 Minutes Intravenous  Once 07/28/23 1444 07/28/23 1617   07/28/23 0000  cefpodoxime  (VANTIN ) 200 MG tablet  Status:  Discontinued        200 mg Oral 2 times daily 07/28/23 1445 07/28/23         Subjective: Patient seen and examined at bedside.  Continues to feel weak.  No chest pain, shortness of breath, fever reported.  Objective: Vitals:   08/02/23 2012 08/03/23 0528 08/03/23 2048 08/04/23 0600  BP: (!) 141/84 (!) 146/93 130/79 (!) 152/81  Pulse: 72 62 69 65  Resp: 18 18 18 18   Temp: 98.6 F (37 C) 97.9 F (36.6 C) 98.4 F (36.9 C) 97.9 F (36.6 C)  TempSrc: Oral Oral Oral Oral  SpO2: 96% 97% 95% 97%  Weight:      Height:        Intake/Output Summary (Last 24  hours) at 08/04/2023 0809 Last data filed at 08/04/2023 0500 Gross per 24 hour  Intake 3 ml  Output 950 ml  Net -947 ml   Filed Weights   07/28/23 1130  Weight: 90.6 kg    Examination:  General: Remains on room air.  No distress.  Chronically ill and deconditioned looking. ENT/neck: No palpable thyromegaly or JVD elevation noted  respiratory: Decreased breath sounds at bases bilaterally with some crackles  CVS: S1-S2 heard; rate currently controlled abdominal: Soft, nontender, slightly distended; no organomegaly, normal bowel  sounds heard extremities: Mild lower extremity edema present; no cyanosis  CNS: Awake and alert.  Slow to respond.  Left-sided hemiparesis present.   Lymph: No cervical lymphadenopathy palpable Skin: No obvious lesion/ecchymosis  psych: Mostly flat with no signs of agitation Musculoskeletal: No obvious joint deformity/swelling  Data Reviewed: I have personally reviewed following labs and imaging studies  CBC: Recent Labs  Lab 07/28/23 1152 07/29/23 0654 07/30/23 0552 07/31/23 0713 08/01/23 0539 08/03/23 0704 08/04/23 0544  WBC 23.9*   < > 14.5* 15.5* 11.6* 13.0* 14.5*  NEUTROABS 16.6*  --   --   --   --   --   --   HGB 12.0*   < > 10.3* 9.8* 9.9* 10.7* 10.4*  HCT 38.5*   < > 33.2* 32.3* 32.7* 34.0* 33.6*  MCV 95.3   < > 96.5 98.5 97.0 95.0 96.0  PLT 133*   < > 70* 80* 95* 196 255   < > = values in this interval not displayed.   Basic Metabolic Panel: Recent Labs  Lab 07/31/23 0713 08/01/23 0539 08/02/23 0749 08/03/23 0704 08/04/23 0544  NA 136 138 138 137 138  K 3.7 3.3* 3.4* 3.8 3.7  CL 107 110 107 108 110  CO2 22 23 23 23 22   GLUCOSE 139* 110* 130* 98 107*  BUN 29* 27* 30* 31* 37*  CREATININE 1.29* 1.37* 1.27* 1.15 1.20  CALCIUM  8.6* 8.2* 8.6* 8.3* 8.4*  MG 2.3 2.0 2.0 2.1 1.9   GFR: Estimated Creatinine Clearance: 58.4 mL/min (by C-G formula based on SCr of 1.2 mg/dL). Liver Function Tests: Recent Labs  Lab 07/28/23 1152 08/03/23 0704 08/04/23 0544  AST 35 24 24  ALT 31 25 25   ALKPHOS 68 48 46  BILITOT 0.4 0.4 0.5  PROT 7.9 7.8 6.9  ALBUMIN 3.6 2.8* 2.8*   No results for input(s): "LIPASE", "AMYLASE" in the last 168 hours. No results for input(s): "AMMONIA" in the last 168 hours. Coagulation Profile: Recent Labs  Lab 07/28/23 1219 07/29/23 0654  INR 1.8* 1.3*   Cardiac Enzymes: No results for input(s): "CKTOTAL", "CKMB", "CKMBINDEX", "TROPONINI" in the last 168 hours. BNP (last 3 results) No results for input(s): "PROBNP" in the last 8760  hours. HbA1C: No results for input(s): "HGBA1C" in the last 72 hours. CBG: No results for input(s): "GLUCAP" in the last 168 hours. Lipid Profile: No results for input(s): "CHOL", "HDL", "LDLCALC", "TRIG", "CHOLHDL", "LDLDIRECT" in the last 72 hours. Thyroid  Function Tests: No results for input(s): "TSH", "T4TOTAL", "FREET4", "T3FREE", "THYROIDAB" in the last 72 hours. Anemia Panel: No results for input(s): "VITAMINB12", "FOLATE", "FERRITIN", "TIBC", "IRON", "RETICCTPCT" in the last 72 hours. Sepsis Labs: Recent Labs  Lab 07/28/23 1328  LATICACIDVEN 1.4    Recent Results (from the past 240 hours)  Blood Culture (routine x 2)     Status: None   Collection Time: 07/28/23 11:52 AM   Specimen: BLOOD RIGHT FOREARM  Result Value Ref Range  Status   Specimen Description   Final    BLOOD RIGHT FOREARM Performed at [facility], [address], [place], [place] [address]    Special Requests   Final    BOTTLES DRAWN AEROBIC AND ANAEROBIC Blood Culture results may not be optimal due to an inadequate volume of blood received in culture bottles Performed at [facility], [address], [place], [place] [address]    Culture   Final    NO GROWTH 5 DAYS Performed at [facility], [address] [place], [place] [address]    Report Status 08/02/2023 FINAL  Final  Blood Culture (routine x 2)     Status: None   Collection Time: 07/28/23 12:19 PM   Specimen: BLOOD RIGHT HAND  Result Value Ref Range Status   Specimen Description   Final    BLOOD RIGHT HAND Performed at [facility], [address], [place], [place] [address]    Special Requests   Final    BOTTLES DRAWN AEROBIC AND ANAEROBIC Blood Culture adequate volume Performed at [facility], [address], [place], [place] [address]    Culture   Final    NO GROWTH 5 DAYS Performed at [facility], [address], [place], [place] [address]     Report Status 08/02/2023 FINAL  Final  Urine Culture     Status: Abnormal   Collection Time: 07/28/23  1:35 PM   Specimen: Urine, Random  Result Value Ref Range Status   Specimen Description   Final    URINE, RANDOM Performed at [facility], [address] [place], [place] [address]    Special Requests   Final    NONE Reflexed from [id number] Performed at [facility], [address] [place], [place] [address]    Culture >=100,000 COLONIES/mL ESCHERICHIA COLI (A)  Final   Report Status 07/30/2023 FINAL  Final   Organism ID, Bacteria ESCHERICHIA COLI (A)  Final      Susceptibility   Escherichia coli - MIC*    AMPICILLIN 4 SENSITIVE Sensitive     CEFAZOLIN <=4 SENSITIVE Sensitive     CEFEPIME <=0.12 SENSITIVE Sensitive     CEFTRIAXONE  <=0.25 SENSITIVE Sensitive     CIPROFLOXACIN  <=0.25 SENSITIVE Sensitive     GENTAMICIN  <=1 SENSITIVE Sensitive     IMIPENEM <=0.25 SENSITIVE Sensitive     NITROFURANTOIN <=16 SENSITIVE Sensitive     TRIMETH/SULFA <=20 SENSITIVE Sensitive     AMPICILLIN/SULBACTAM <=2 SENSITIVE Sensitive     PIP/TAZO <=4 SENSITIVE Sensitive ug/mL    * >=100,000 COLONIES/mL ESCHERICHIA COLI         Radiology Studies: DG Abd 2 Views Result Date: 08/02/2023 CLINICAL DATA:  Thrombocytopenia. Complains of loose stools and abdominal tightness. EXAM: ABDOMEN - 2 VIEW COMPARISON:  None FINDINGS: Mild gaseous distension of the stomach. No dilated loops of small bowel. There is a moderate stool burden noted within the colon. No signs pneumoperitoneum. Severe degenerative changes involving the right hip. Surgical clips noted within the left upper quadrant and right upper quadrant of the abdomen. IMPRESSION: 1. No signs of bowel obstruction. 2. Moderate stool burden noted within the colon. Electronically Signed   By: Kimberley Penman M.D.   On: 08/02/2023 13:21         Scheduled Meds:  baclofen   10 mg Oral BID   buPROPion   150 mg Oral  Daily   Chlorhexidine  Gluconate Cloth  6 each Topical Daily   gabapentin   100 mg Oral TID   levETIRAcetam   1,500 mg Oral BID   melatonin  5 mg Oral QHS   pantoprazole   40 mg Oral Daily   rivaroxaban   20 mg Oral Q supper   rosuvastatin   40 mg Oral QHS   saccharomyces boulardii  250 mg Oral BID   sertraline   100 mg Oral Daily   sodium chloride  flush  3 mL Intravenous Q12H   Continuous Infusions:           Audria Leather, MD [facility] 08/04/2023, 8:09 AM

## 2023-08-05 DIAGNOSIS — A419 Sepsis, unspecified organism: Secondary | ICD-10-CM | POA: Diagnosis not present

## 2023-08-05 LAB — COMPREHENSIVE METABOLIC PANEL WITH GFR
ALT: 24 U/L (ref 0–44)
AST: 26 U/L (ref 15–41)
Albumin: 2.6 g/dL — ABNORMAL LOW (ref 3.5–5.0)
Alkaline Phosphatase: 48 U/L (ref 38–126)
Anion gap: 7 (ref 5–15)
BUN: 31 mg/dL — ABNORMAL HIGH (ref 8–23)
CO2: 22 mmol/L (ref 22–32)
Calcium: 8.3 mg/dL — ABNORMAL LOW (ref 8.9–10.3)
Chloride: 105 mmol/L (ref 98–111)
Creatinine, Ser: 1.34 mg/dL — ABNORMAL HIGH (ref 0.61–1.24)
GFR, Estimated: 54 mL/min — ABNORMAL LOW (ref >60)
Glucose, Bld: 106 mg/dL — ABNORMAL HIGH (ref 70–99)
Potassium: 3.7 mmol/L (ref 3.5–5.1)
Sodium: 134 mmol/L — ABNORMAL LOW (ref 135–145)
Total Bilirubin: 0.4 mg/dL (ref 0.0–1.2)
Total Protein: 7 g/dL (ref 6.5–8.1)

## 2023-08-05 LAB — CBC
HCT: 33.8 % — ABNORMAL LOW (ref 39.0–52.0)
Hemoglobin: 10.3 g/dL — ABNORMAL LOW (ref 13.0–17.0)
MCH: 29.7 pg (ref 26.0–34.0)
MCHC: 30.5 g/dL (ref 30.0–36.0)
MCV: 97.4 fL (ref 80.0–100.0)
Platelets: 332 10*3/uL (ref 150–400)
RBC: 3.47 MIL/uL — ABNORMAL LOW (ref 4.22–5.81)
RDW: 17.6 % — ABNORMAL HIGH (ref 11.5–15.5)
WBC: 14.1 10*3/uL — ABNORMAL HIGH (ref 4.0–10.5)
nRBC: 3.1 % — ABNORMAL HIGH (ref 0.0–0.2)

## 2023-08-05 LAB — MAGNESIUM: Magnesium: 2 mg/dL (ref 1.7–2.4)

## 2023-08-05 NOTE — Progress Notes (Signed)
 Occupational Therapy Treatment Patient Details Name: Mark [name] MRN: [id number] DOB: 1946-02-24 Today's Date: 08/05/2023   History of present illness Patient is a 78 year old male  who presented with fevers and rigors with weakness and foul-smelling urine. He was found to have UTI and started on IV antibiotics. PMH: prior DVT, prior seizure episodes as well as stroke with chronic left hemiparesis, neurogenic bladder does intermittent self cath at home   OT comments  Patient continues to make progress towards goals. Patients personal resting hand splint is in room at this time. Patient appears to have no awareness of schedule for this item. Patient was able to engage from sit to stands with min A on this date with 5 reps from bed and then 5 reps from recliner in room with cues for leaning forwards. Patient needed continued cues to apply during session. Patient is eager and motivated to engage in session. Patient would continue to benefit from skilled OT services at this time while admitted and after d/c to address noted deficits in order to improve overall safety and independence in ADLs. Patient will benefit from intensive inpatient follow-up therapy, >3 hours/day.       If plan is discharge home, recommend the following:  A lot of help with walking and/or transfers;A lot of help with bathing/dressing/bathroom;Assistance with cooking/housework;Assistance with feeding;Direct supervision/assist for medications management;Direct supervision/assist for financial management;Assist for transportation;Help with stairs or ramp for entrance;Supervision due to cognitive status   Equipment Recommendations  None recommended by OT       Precautions / Restrictions Precautions Precautions: Fall Recall of Precautions/Restrictions: Impaired Precaution/Restrictions Comments: L hemiplegia Required Braces or Orthoses: Other Brace Other Brace: L AFO Restrictions Weight Bearing Restrictions Per Provider  Order: No       Mobility Bed Mobility Overal bed mobility: Needs Assistance Bed Mobility: Supine to Sit     Supine to sit: Contact guard, HOB elevated, Used rails     General bed mobility comments: increased time and effort, pt hooking LLE with RLE to assist to EOB, strong use of bedrail with RUE to assist in uprighting trunk          Balance Overall balance assessment: Needs assistance, History of Falls Sitting-balance support: Feet supported Sitting balance-Leahy Scale: Fair           ADL either performed or assessed with clinical judgement   ADL Overall ADL's : Needs assistance/impaired                       Lower Body Dressing Details (indicate cue type and reason): patient was max A to don L shoe and min A to don R shoe with increased time. patient indicated using long handled shoe horn at home.                      Cognition Arousal: Alert Behavior During Therapy: WFL for tasks assessed/performed Cognition: History of cognitive impairments             OT - Cognition Comments: patient has good awareness of memory deficits and admits to being impulisve at times.                 Following commands: Intact                      Pertinent Vitals/ Pain       Pain Assessment Pain Assessment: Faces Faces Pain Scale: No hurt  Frequency  Min 2X/week        Progress Toward Goals  OT Goals(current goals can now be found in the care plan section)  Progress towards OT goals: Progressing toward goals     Plan         AM-PAC OT "6 Clicks" Daily Activity     Outcome Measure   Help from another person eating meals?: A Little Help from another person taking care of personal grooming?: A Little Help from another person toileting, which includes using toliet, bedpan, or urinal?: A Lot Help from another person bathing (including washing, rinsing, drying)?: A Lot Help from another person to put on and taking off  regular upper body clothing?: A Lot Help from another person to put on and taking off regular lower body clothing?: A Lot 6 Click Score: 14    End of Session Equipment Utilized During Treatment: Gait belt;Other (comment) (hemi walker)  OT Visit Diagnosis: Unsteadiness on feet (R26.81);Other abnormalities of gait and mobility (R26.89);Muscle weakness (generalized) (M62.81);Other symptoms and signs involving the nervous system (R29.898);Hemiplegia and hemiparesis Hemiplegia - Right/Left: Left Hemiplegia - dominant/non-dominant: Non-Dominant Hemiplegia - caused by: Other cerebrovascular disease   Activity Tolerance Patient tolerated treatment well   Patient Left in chair;with call bell/phone within reach;with chair alarm set   Nurse Communication Mobility status        Time: 1478-2956 OT Time Calculation (min): 29 min  Charges: OT General Charges $OT Visit: 1 Visit OT Treatments $Self Care/Home Management : 23-37 mins  Wynette Heckler, MS Acute Rehabilitation Department Office# [phone]   Jame Maze 08/05/2023, 12:33 PM

## 2023-08-05 NOTE — Progress Notes (Signed)
 Physical Therapy Treatment Patient Details Name: Mark [name] MRN: [id number] DOB: 11/17/1945 Today's Date: 08/05/2023   History of Present Illness Patient is a 78 year old male  who presented with fevers and rigors with weakness and foul-smelling urine. He was found to have UTI and started on IV antibiotics. PMH: prior DVT, prior seizure episodes as well as stroke with chronic left hemiparesis, neurogenic bladder does intermittent self cath at home    PT Comments  Pt ambulated 50' with hemiwalker with narrow base of support, no overt loss of balance.  Distance limited by pt's need to have a BM. Pt is progressing well with mobility.     If plan is discharge home, recommend the following: Assistance with cooking/housework;Assist for transportation;Help with stairs or ramp for entrance;A little help with walking and/or transfers;A little help with bathing/dressing/bathroom   Can travel by private vehicle        Equipment Recommendations  None recommended by PT    Recommendations for Other Services       Precautions / Restrictions Precautions Precautions: Fall Recall of Precautions/Restrictions: Impaired Precaution/Restrictions Comments: L hemiplegia Required Braces or Orthoses: Other Brace Other Brace: L AFO Restrictions Weight Bearing Restrictions Per Provider Order: No     Mobility  Bed Mobility               General bed mobility comments: up in recliner    Transfers Overall transfer level: Needs assistance Equipment used: Hemi-walker Transfers: Sit to/from Stand Sit to Stand: Min assist           General transfer comment: min A to power up fom recliner, min A to control descent to toilet with VCs to use grab bar    Ambulation/Gait Ambulation/Gait assistance: Min assist Gait Distance (Feet): 50 Feet Assistive device: Hemi-walker Gait Pattern/deviations: Step-to pattern, Decreased weight shift to left, Decreased dorsiflexion - left, Narrow base of  support Gait velocity: decreased     General Gait Details: step to gait pattern with trunk lean to R while advancing LLE, good hemiwalker placement, verbal cues for intermittent hemiwalker placement and path to safely navigate around obstacles at safe distance, no overt loss of balance; distance limited by pt needing to have BM   Stairs             Wheelchair Mobility     Tilt Bed    Modified Rankin (Stroke Patients Only)       Balance Overall balance assessment: Needs assistance, History of Falls Sitting-balance support: Feet supported Sitting balance-Leahy Scale: Fair     Standing balance support: Single extremity supported, Reliant on assistive device for balance, During functional activity Standing balance-Leahy Scale: Poor                              Communication Communication Communication: Impaired Factors Affecting Communication: Hearing impaired  Cognition Arousal: Alert Behavior During Therapy: WFL for tasks assessed/performed                             Following commands: Intact      Cueing Cueing Techniques: Verbal cues  Exercises      General Comments        Pertinent Vitals/Pain Pain Assessment Pain Score: 0-No pain Faces Pain Scale: No hurt    Home Living  Prior Function            PT Goals (current goals can now be found in the care plan section) Acute Rehab PT Goals Patient Stated Goal: return home, get stronger PT Goal Formulation: With patient/family Time For Goal Achievement: 08/13/23 Potential to Achieve Goals: Good Progress towards PT goals: Progressing toward goals    Frequency    Min 3X/week      PT Plan      Co-evaluation              AM-PAC PT "6 Clicks" Mobility   Outcome Measure  Help needed turning from your back to your side while in a flat bed without using bedrails?: A Little Help needed moving from lying on your back to sitting  on the side of a flat bed without using bedrails?: A Lot Help needed moving to and from a bed to a chair (including a wheelchair)?: A Little Help needed standing up from a chair using your arms (e.g., wheelchair or bedside chair)?: A Little Help needed to walk in hospital room?: A Little Help needed climbing 3-5 steps with a railing? : Total 6 Click Score: 15    End of Session Equipment Utilized During Treatment: Gait belt Activity Tolerance: Patient tolerated treatment well Patient left: with call bell/phone within reach;Other (comment) (on toilet) Nurse Communication: Mobility status PT Visit Diagnosis: Unsteadiness on feet (R26.81);Muscle weakness (generalized) (M62.81);Other abnormalities of gait and mobility (R26.89);Difficulty in walking, not elsewhere classified (R26.2);Other symptoms and signs involving the nervous system (R29.898) Hemiplegia - Right/Left: Left     Time: 3244-0102 PT Time Calculation (min) (ACUTE ONLY): 13 min  Charges:    $Gait Training: 8-22 mins PT General Charges $$ ACUTE PT VISIT: 1 Visit                     Mark [name] PT 08/05/2023  Acute Rehabilitation Services  Office [phone]

## 2023-08-05 NOTE — Progress Notes (Signed)
 [facility] Liaison Note  08/05/2023  Mark [name] February 02, 1946 [id number]  Location: RN Hospital Liaison screened the patient remotely at [facility].  Insurance: [facility] Advantage   Mark [name] is a 78 y.o. male who is a Primary Care Patient of Victorio Grave, MD [facility]). The patient was screened for 30 day readmission hospitalization with noted high risk score for unplanned readmission risk with 4 IP in 6 months.  The patient was assessed for potential Care Management service needs for post hospital transition for care coordination. Review of patient's electronic medical record reveals patient was admitted for Sepsis. Pt recommended inpt rehabilitation currently pending. The facility will continue to address his ongoing needs. Pt is a [facility] pt who has their own care management team to follow up post hospital discharge.    [facility] Care Management/Population Health does not replace or interfere with any arrangements made by the Inpatient Transition of Care team.   For questions contact:   Lilla Reichert, RN, BSN Hospital Liaison [place]   [facility], Population Health Office Hours MTWF  8:00 am-6:00 pm Direct Dial: [phone] [email or web address]

## 2023-08-05 NOTE — Progress Notes (Signed)
 Physical Medicine & Rehabilitation Consult Service  Pt discussed with rehab admissions coordinator. Chart has been reviewed. This is a patient familiar to the rehab program who was with us  from 07/09/23-07/22/23 for debility after severe thrombocytopenia. He was discharged home at a supervision contact guard assist level. He was readmitted on 4/14 with urosepsis and AKI. Course complicated by thrombocytopenia/ITP and pt received IVIG and steroids.  Also with AKI, leukocytosis. With therapy he's requiring mod assist for sit-std transfers and min assist for gait 15-45'.  He has been min to max assist for ADL's.    Home: Home Living Family/patient expects to be discharged to:: Private residence Living Arrangements: Spouse/significant other Available Help at Discharge: Family, Available 24 hours/day Type of Home: House Home Access: Ramped entrance Entrance Stairs-Number of Steps: 5 STE prefers to use steps not ramp Entrance Stairs-Rails: Left, Right, Can reach both Home Layout: One level Bathroom Shower/Tub: Engineer, manufacturing systems: Handicapped height Bathroom Accessibility: Yes Home Equipment: Shower seat, BSC/3in1, Cane - single point, Grab bars - toilet, Hand held shower head, Grab bars - tub/shower, Adaptive equipment, Wheelchair - manual, Transport chair Additional Comments: pt reports being ambulatory for household distances using hemi walker. Pt has L PFRW, WC, shower chair/bench, bedside commode. Pt reports being independnet with cathing at home prior to admission.  Lives With: Spouse  Functional History: Prior Function Prior Level of Function : Needs assist  Cognitive Assist : Mobility (cognitive), ADLs (cognitive) Mobility (Cognitive): Intermittent cues ADLs (Cognitive): Intermittent cues Physical Assist : Mobility (physical), ADLs (physical) Mobility (physical): Transfers, Gait, Stairs ADLs (physical): Bathing, IADLs Mobility Comments: wife utilizes gait belt as needed, pt  amb with hemi walker in the home,,DC from [facility] AIR 07/21/23 ADLs Comments: wife assists with bathing, active wtih HHOT and PT Functional Status:  Mobility: Bed Mobility Overal bed mobility: Needs Assistance Bed Mobility: Supine to Sit Rolling: Used rails, Contact guard assist Supine to sit: Contact guard, HOB elevated, Used rails Sit to supine: Mod assist General bed mobility comments: increased time and effort, pt hooking LLE with RLE to assist to EOB, strong use of bedrail with RUE to assist in uprighting trunk Transfers Overall transfer level: Needs assistance Equipment used: Hemi-walker Transfers: Sit to/from Stand Sit to Stand: Min assist, From elevated surface Bed to/from chair/wheelchair/BSC transfer type:: Stand pivot Stand pivot transfers: Mod assist General transfer comment: min A to power up from elevated bed and steady, verbal cues for widening BOS and pt needing increased time to reposition; min A to power up from toilet with use of grab bar in bathroom, again cues for BOS and hemiwalker positioning to improve safety Ambulation/Gait Ambulation/Gait assistance: Min assist Gait Distance (Feet):  (15', 40', 35') Assistive device: Hemi-walker Gait Pattern/deviations: Step-to pattern, Decreased weight shift to left, Decreased dorsiflexion - left, Narrow base of support General Gait Details: step to gait pattern with trunk lean to R while advancing LLE, good hemiwalker placement, verbal cues for intermittent hemiwalker placement and path to safely navigate around obstacles at safe distance, recliner follow for seated rest breaks due to fatigue Gait velocity: decreased    ADL: ADL Overall ADL's : Needs assistance/impaired Eating/Feeding: Set up Eating/Feeding Details (indicate cue type and reason): one handed techniques Grooming: Set up, Sitting, Wash/dry hands, Wash/dry face, Oral care, Brushing hair Grooming Details (indicate cue type and reason): uses L hand 20% of time as  gross assist only Upper Body Bathing: Minimal assistance, Sitting Upper Body Bathing Details (indicate cue type and reason): EOB Lower Body  Bathing: Maximal assistance, Sitting/lateral leans, Sit to/from stand Upper Body Dressing : Minimal assistance, Sitting (EOB) Lower Body Dressing: Maximal assistance, Sitting/lateral leans, Sit to/from stand (EOB including L AFO) Lower Body Dressing Details (indicate cue type and reason): patient was max A to don L shoe and min A to don R shoe with increased time. patient indicated using long handled shoe horn at home. Toilet Transfer: Moderate assistance, +2 for physical assistance, +2 for safety/equipment, BSC/3in1 Toilet Transfer Details (indicate cue type and reason): use of hemi walker for SPT Toileting- Clothing Manipulation and Hygiene: Maximal assistance, Sitting/lateral lean, Sit to/from stand Functional mobility during ADLs: Moderate assistance (hemi walker) General ADL Comments: educated for patient to have wife don L AFO bed level as pt reports wife has back issues  Cognition: Cognition Orientation Level: Oriented X4 Cognition Arousal: Alert Behavior During Therapy: WFL for tasks assessed/performed   Assessment: 78 yo male with debility after urosepsis.  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 Additionally, the patient requires daily MD oversight of the active medical issues noted above. Projected goals would be supervision to contact guard assist  with an ELOS of 7 days.  Dispo and social supports are appropriate.    Rehab Admissions Coordinator to follow up.    Rawland Caddy, MD, [facility] Physical Medicine & Rehabilitation Medical Director Rehabilitation Services 08/05/2023

## 2023-08-05 NOTE — Progress Notes (Signed)
 Inpatient Rehab Admissions Coordinator:   Left message for spouse to discuss CIR recommendations.  Will follow.   Loye Rumble, PT, DPT Admissions Coordinator [phone] 08/05/23  10:48 AM

## 2023-08-05 NOTE — Progress Notes (Signed)
 Inpatient Rehab Admissions Coordinator:   Spoke to wife to discuss rehab recommendations and they are in agreement to try and pursue re-admission.  I will need insurance authorization and have asked OT to see and PMR MD to review for necessity.  I will send to [facility] once I have all necessary documentation.    Loye Rumble, PT, DPT Admissions Coordinator [phone] 08/05/23  10:58 AM

## 2023-08-05 NOTE — PMR Pre-admission (Signed)
 PMR Admission Coordinator Pre-Admission Assessment  Patient: Mark [name] is an 78 y.o., male MRN: [id number] DOB: 09/03/1945 Height: 5\' 11"  (180.3 cm) Weight: 90.6 kg              Insurance Information HMO:     PPO: yes     PCP:      IPA:      80/20:      OTHER:  PRIMARY: [employer] Medicare HMO/PPO      Policy#: [id number]      Subscriber: pt CM Name: Jyl Or      Phone#: [phone]     Fax#: [phone] Pre-Cert#: [id number] auth for CIR from Tiffany with [employer] with updates due to fax listed above on 4/29      Employer:  Benefits:  Phone #: [phone]     Name:  Eff. Date: 04/16/23     Deduct: $0      Out of Pocket Max: $4150 (met [id number])      Life Max: n/a  CIR: $30/day for days 1-6      SNF: $10/day for days 1-20, $214/day for days 21-100 Outpatient:      Co-Pay: $10/visit Home Health: 100%      Co-Pay:  DME: 80%     Co-Pay: 20% Providers:  SECONDARY:       Policy#:       Phone#:   Artist:       Phone#:   The Engineer, materials Information Summary" for patients in Inpatient Rehabilitation Facilities with attached "Privacy Act Statement-Health Care Records" was provided and verbally reviewed with: Patient and Family  Emergency Contact Information Contact Information     Name Relation Home Work Mobile   Shackelford-Stocking,Lorraine Spouse [phone]  [phone]      Other Contacts     Name Relation Home Work Mobile   Harrington,[name] Daughter [phone]        Current Medical History  Patient Admitting Diagnosis: debility, ITP  History of Present Illness: Pt is a 78 y/o male with PMH of ITP, prior CVA with residual L hemiparesis, DVT on xarelto , lupus, CAD, HTN, GERD, seizure, and CKD IIIa, and neurogenic bladder (self caths) who was recently admitted to [facility] CIR from 3/26 through 4/8 for similar presentation.  He presented to [facility] on 4/14 with c/o chills, fever, and foul smelling urine.  He did have a fall early that morning. In ED BP  87/63, WBC 23.9, platelets 133,000, and creatinine 1.68.  and he was admitted for sepsis from urologic origin.  He was started on IV ceftriaxone .  Oncology consulted given history of ITP and possible recurrence.  He received nplate , decadron , and IVIG to treat ITP recurrence.  Oncology recommendations to start Revlimid to maintain remission.  Therapy evaluations completed and pt has experienced a significant decline in function since d/c from CIR on 4/8 (currently max assist for ADLs, and min assist for mobility compared to supervision overall at d/c).  Recommendations are for pt to return to CIR.    Glasgow Coma Scale Score: 15  Patient's medical record from [facility] has been reviewed by the rehabilitation admission coordinator and physician.  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 No  Family History  family history is not on file.   Current Medications   Current Facility-Administered Medications:    acetaminophen  (TYLENOL ) tablet 650 mg, 650 mg, Oral, Q6H PRN **OR** acetaminophen  (TYLENOL ) suppository 650 mg, 650 mg, Rectal, Q6H PRN, Bennie Brave, MD   baclofen  (LIORESAL ) tablet 10 mg, 10 mg, Oral, BID, Goel, Hersh, MD, 10 mg at 08/05/23 1000    buPROPion  (WELLBUTRIN  XL) 24 hr tablet 150 mg, 150 mg, Oral, Daily, Goel, Hersh, MD, 150 mg at 08/05/23 1000   Chlorhexidine  Gluconate Cloth 2 % PADS 6 each, 6 each, Topical, Daily, Montey Apa, DO, 6 each at 08/04/23 1617   gabapentin  (NEURONTIN ) capsule 100 mg, 100 mg, Oral, TID, Goel, Hersh, MD, 100 mg at 08/05/23 1000   guaiFENesin -dextromethorphan (ROBITUSSIN DM) 100-10 MG/5ML syrup 5 mL, 5 mL, Oral, Q4H PRN, Maury Space, Kshitiz, MD   levETIRAcetam  (KEPPRA ) tablet 1,500 mg, 1,500 mg, Oral, BID, Goel, Hersh, MD, 1,500 mg at 08/05/23 1610   melatonin tablet 5 mg, 5 mg, Oral, QHS, Goel, Hersh, MD, 5 mg at 08/04/23 2147   pantoprazole  (PROTONIX ) EC tablet 40 mg, 40 mg, Oral, Daily, Goel, Hersh, MD, 40 mg at 08/05/23 1000   polyethylene glycol (MIRALAX  / GLYCOLAX ) packet 17 g, 17 g, Oral, Daily PRN, Goel, Hersh, MD   polyvinyl alcohol  (LIQUIFILM TEARS) 1.4 % ophthalmic solution 1 drop, 1 drop, Both Eyes, PRN, Goel, Hersh, MD   rivaroxaban  (XARELTO ) tablet 20 mg, 20 mg, Oral, Q supper, Ennever, Peter R, MD, 20 mg at 08/04/23 1616   rosuvastatin  (CRESTOR ) tablet 40 mg, 40 mg, Oral, QHS, Goel, Hersh, MD, 40 mg at 08/04/23 2147   saccharomyces boulardii (FLORASTOR) capsule 250 mg, 250 mg, Oral, BID, Ennever, Peter R, MD, 250 mg at 08/05/23 1000   sertraline  (ZOLOFT ) tablet 100 mg, 100 mg, Oral, Daily, Goel, Hersh, MD, 100 mg at 08/05/23 1000   sodium chloride  flush (NS) 0.9 % injection 3 mL, 3 mL, Intravenous, Q12H, Bennie Brave, MD, 3 mL at 08/04/23 2148  Patients Current Diet:  Diet Order             Diet vegetarian Room service appropriate? Yes; Fluid consistency: Thin  Diet effective now                   Precautions / Restrictions Precautions Precautions: Fall Precaution/Restrictions Comments: L hemiplegia Other Brace: L AFO Restrictions Weight Bearing Restrictions Per Provider Order: No   Has the patient had 2 or more falls or a fall with injury in the past year?Yes  Prior  Activity Level Limited Community (1-2x/wk): recent d/c from CIR at supervision level with HW for adls and mobility up to 100'. not driving  Prior Functional Level Prior Function Prior Level of Function : Needs assist  Cognitive Assist : Mobility (cognitive), ADLs (cognitive) Mobility (Cognitive): Intermittent cues ADLs (Cognitive): Intermittent cues Physical Assist : Mobility (  physical), ADLs (physical) Mobility (physical): Transfers, Gait, Stairs ADLs (physical): Bathing, IADLs Mobility Comments: wife utilizes gait belt as needed, pt amb with hemi walker in the home,,DC from [facility] 07/21/23 ADLs Comments: wife assists with bathing, active wtih HHOT and PT  Self Care: Did the patient need help bathing, dressing, using the toilet or eating?  Needed some help  Indoor Mobility: Did the patient need assistance with walking from room to room (with or without device)? Needed some help  Stairs: Did the patient need assistance with internal or external stairs (with or without device)? Needed some help  Functional Cognition: Did the patient need help planning regular tasks such as shopping or remembering to take medications? Needed some help  Patient Information Are you of Hispanic, Latino/a,or Spanish origin?: A. No, not of Hispanic, Latino/a, or Spanish origin What is your race?: A. White Do you need or want an interpreter to communicate with a doctor or health care staff?: 0. No  Patient's Response To:  Health Literacy and Transportation Is the patient able to respond to health literacy and transportation needs?: Yes Health Literacy - How often do you need to have someone help you when you read instructions, pamphlets, or other written material from your doctor or pharmacy?: Never In the past 12 months, has lack of transportation kept you from medical appointments or from getting medications?: No In the past 12 months, has lack of transportation kept you from meetings, work, or from getting  things needed for daily living?: No  Journalist, newspaper / Equipment Home Equipment: Shower seat, BSC/3in1, Medical laboratory scientific officer - single point, Grab bars - toilet, Hand held shower head, Grab bars - tub/shower, Adaptive equipment, Wheelchair - manual, Transport chair  Prior Device Use: Indicate devices/aids used by the patient prior to current illness, exacerbation or injury? Manual wheelchair and hemiwalker  Current Functional Level Cognition  Orientation Level: Oriented X4    Extremity Assessment (includes Sensation/Coordination)  Upper Extremity Assessment: LUE deficits/detail LUE Deficits / Details: pt with hx CVA.  L UE non functional, increased tone. Pt reports he does perform ROM as well as the HHOT helps. patient noted to have one finger breath subluxation of this shoulder with education provided on protection of UE. patient reported having a splint at home but unclear if it is still there at this time. LUE:  (phone call x 2 to wife to ensure he does have his L resting hand splint or palm guard with messages left) LUE Coordination: decreased fine motor, decreased gross motor  Lower Extremity Assessment: LLE deficits/detail, Defer to PT evaluation LLE Deficits / Details: appled L AFO for stability for transfers this session LLE Coordination: decreased fine motor, decreased gross motor    ADLs  Overall ADL's : Needs assistance/impaired Eating/Feeding: Set up Eating/Feeding Details (indicate cue type and reason): one handed techniques Grooming: Set up, Sitting, Wash/dry hands, Wash/dry face, Oral care, Brushing hair Grooming Details (indicate cue type and reason): uses L hand 20% of time as gross assist only Upper Body Bathing: Minimal assistance, Sitting Upper Body Bathing Details (indicate cue type and reason): EOB Lower Body Bathing: Maximal assistance, Sitting/lateral leans, Sit to/from stand Upper Body Dressing : Minimal assistance, Sitting (EOB) Lower Body Dressing: Maximal assistance,  Sitting/lateral leans, Sit to/from stand (EOB including L AFO) Toilet Transfer: Moderate assistance, +2 for physical assistance, +2 for safety/equipment, BSC/3in1 Toilet Transfer Details (indicate cue type and reason): use of hemi walker for SPT Toileting- Clothing Manipulation and Hygiene: Maximal assistance, Sitting/lateral lean, Sit  to/from stand Functional mobility during ADLs: Moderate assistance (hemi walker) General ADL Comments: educated for patient to have wife don L AFO bed level as pt reports wife has back issues    Mobility  Overal bed mobility: Needs Assistance Bed Mobility: Supine to Sit Rolling: Used rails, Contact guard assist Supine to sit: Contact guard, HOB elevated, Used rails Sit to supine: Mod assist General bed mobility comments: increased time and effort, pt hooking LLE with RLE to assist to EOB, strong use of bedrail with RUE to assist in uprighting trunk    Transfers  Overall transfer level: Needs assistance Equipment used: Hemi-walker Transfers: Sit to/from Stand Sit to Stand: Min assist, From elevated surface Bed to/from chair/wheelchair/BSC transfer type:: Stand pivot Stand pivot transfers: Mod assist General transfer comment: min A to power up from elevated bed and steady, verbal cues for widening BOS and pt needing increased time to reposition; min A to power up from toilet with use of grab bar in bathroom, again cues for BOS and hemiwalker positioning to improve safety    Ambulation / Gait / Stairs / Wheelchair Mobility  Ambulation/Gait Ambulation/Gait assistance: Editor, commissioning (Feet):  (15', 40', 35') Assistive device: Hemi-walker Gait Pattern/deviations: Step-to pattern, Decreased weight shift to left, Decreased dorsiflexion - left, Narrow base of support General Gait Details: step to gait pattern with trunk lean to R while advancing LLE, good hemiwalker placement, verbal cues for intermittent hemiwalker placement and path to safely navigate  around obstacles at safe distance, recliner follow for seated rest breaks due to fatigue Gait velocity: decreased    Posture / Balance Dynamic Sitting Balance Sitting balance - Comments: posterior lean without single UE support; verbal/visual cues to lean anteriorly 2* posterior lean Balance Overall balance assessment: Needs assistance, History of Falls Sitting-balance support: Feet supported Sitting balance-Leahy Scale: Fair Sitting balance - Comments: posterior lean without single UE support; verbal/visual cues to lean anteriorly 2* posterior lean Postural control: Posterior lean Standing balance support: Single extremity supported, Reliant on assistive device for balance, During functional activity Standing balance-Leahy Scale: Poor    Special needs/care consideration Special service needs intermittent catheterizations at home prior to admission, HOH, vegetarian and wife orders all meals     Previous Home Environment (from acute therapy documentation) Living Arrangements: Spouse/significant other  Lives With: Spouse Available Help at Discharge: Family, Available 24 hours/day Type of Home: House Home Layout: One level Home Access: Ramped entrance Entrance Stairs-Rails: Left, Right, Can reach both Entrance Stairs-Number of Steps: 5 STE prefers to use steps not ramp Bathroom Shower/Tub: Engineer, manufacturing systems: Handicapped height Bathroom Accessibility: Yes Home Care Services: No (working on it) Additional Comments: pt reports being ambulatory for household distances using hemi walker. Pt has L PFRW, WC, shower chair/bench, bedside commode. Pt reports being independnet with cathing at home prior to admission.  Discharge Living Setting Plans for Discharge Living Setting: Patient's home Type of Home at Discharge: House Discharge Home Layout: One level Discharge Home Access: Ramped entrance Discharge Bathroom Shower/Tub: Tub/shower unit Discharge Bathroom Toilet:  Handicapped height Discharge Bathroom Accessibility: Yes How Accessible: Accessible via walker Does the patient have any problems obtaining your medications?: No  Social/Family/Support Systems Patient Roles: Spouse, Parent Contact Information: spouse Anticipated Caregiver: Milton Alpers (spouse) Anticipated Caregiver's Contact Information: [phone] Ability/Limitations of Caregiver: Spouse can provide supervision but is limited in her ability to provide physical assist Caregiver Availability: 24/7 Discharge Plan Discussed with Primary Caregiver: Yes Is Caregiver In Agreement with Plan?: Yes Does Caregiver/Family have  Issues with Lodging/Transportation while Pt is in Rehab?: No   Goals Patient/Family Goal for Rehab: PT/OT supervision, SLP n/a Expected length of stay: 6-9 days Additional Information: Discharge plan: return to pt's home where his spouse and family can provide 24/7 supervision Pt/Family Agrees to Admission and willing to participate: Yes Program Orientation Provided & Reviewed with Pt/Caregiver Including Roles  & Responsibilities: Yes   Decrease burden of Care through IP rehab admission: n/a   Possible need for SNF placement upon discharge: Not anticipated.  Pt expected to return to supervision level with brief CIR stay.  Home with spouse 24/7 as before.    Patient Condition: This patient's condition remains as documented in the consult dated 08/05/23, in which the Rehabilitation Physician determined and documented that the patient's condition is appropriate for intensive rehabilitative care in an inpatient rehabilitation facility. Will admit to inpatient rehab today.  Preadmission Screen Completed By:  Mickey Alar, PT, DPT 08/05/2023 11:01 AM ______________________________________________________________________   Discussed status with Dr. Raulkaron 08/06/23 at 10:02 AM  and received approval for admission today.  Admission Coordinator:  Caitlin E Warren, DPT  time10:02 AM Alanna Hu 08/06/23

## 2023-08-05 NOTE — Progress Notes (Signed)
 PROGRESS NOTE    Mark [name]  VZD:[id number] DOB: 1946/01/18 DOA: 07/28/2023 PCP: Victorio Grave, MD   Brief Narrative:  78 y.o. male with medical history significant of prior DVT, prior seizure episodes as well as stroke with chronic left hemiparesis, neurogenic bladder does intermittent self cath at home presented with fevers and rigors with weakness and foul-smelling urine.  He was found to have UTI and started on IV antibiotics.  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  PT recommended SNF placement.  TOC consulted.  PT now recommending CIR.  CIR consulted.  Currently medically stable for discharge.  Assessment & Plan:   Sepsis: Present on admission UTI: Present on admission, possibly related to intermittent self-catheterization - Blood cultures negative so far.  Urine culture growing E. coli.  Rocephin  switched to cefadroxil  on 07/30/2023 and subsequently cefadroxil  was discontinued on 08/03/2023 after total 7-day course of therapy. - Has resolved.  Off IV fluids.  Currently not in temperatures.  AKI on CKD stage IIIa - Creatinine 1.68 on presentation; baseline creatinine of 1.3-1.4.  Treated with IV fluids and creatinine has improved to 1.34 today.  Monitor intermittently  Leukocytosis - Monitor.  Possibly reactive from recent steroid use.  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  Platelets have improved to 332 today.  Received IVIG, Decadron  and Nplate  as per oncology. Aspirin  discontinued, will resume on discharge.  Hypokalemia - Improved  Anemia of chronic disease -from chronic illnesses.  Hemoglobin currently stable.  Monitor intermittently  History of DVT - Xarelto  has been resumed by oncology.  History of seizures - Continue Keppra   Hyperlipidemia -Continue statin  Depression - Continue Wellbutrin  and Zoloft   GERD - Continue PPI  Physical deconditioning - PT  recommending SNF placement initially but now recommending CIR placement.  CIR consulted.  DVT prophylaxis: Xarelto  Code Status: DNR Family Communication: Wife at bedside Disposition Plan: Status is: Inpatient Remains inpatient appropriate because: Of severity of illness.  Need for SNF or CIR placement.  Currently medically stable for discharge to SNF or CIR    Consultants: Oncology  Procedures: None  Antimicrobials:  Anti-infectives (From admission, onward)    Start     Dose/Rate Route Frequency Ordered Stop   07/30/23 1200  cefadroxil  (DURICEF) capsule 1,000 mg        1,000 mg Oral 2 times daily 07/30/23 1123 08/03/23 2156   07/29/23 1500  cefTRIAXone  (ROCEPHIN ) 1 g in sodium chloride  0.9 % 100 mL IVPB  Status:  Discontinued        1 g 200 mL/hr over 30 Minutes Intravenous Every 24 hours 07/28/23 1705 07/30/23 1123   07/28/23 1445  cefTRIAXone  (ROCEPHIN ) 1 g in sodium chloride  0.9 % 100 mL IVPB        1 g 200 mL/hr over 30 Minutes Intravenous  Once 07/28/23 1444 07/28/23 1617   07/28/23 0000  cefpodoxime  (VANTIN ) 200 MG tablet  Status:  Discontinued        200 mg Oral 2 times daily 07/28/23 1445 07/28/23         Subjective: Patient seen and examined at bedside.  No fever, vomiting, chest pain reported.  Vitals:   08/04/23 0600 08/04/23 1318 08/04/23 2153 08/05/23 0512  BP: (!) 152/81 132/74 128/65 136/79  Pulse: 65 81 75 66  Resp: 18 20 17 17   Temp: 97.9 F (36.6 C) 98.5 F (36.9 C) 98.1 F (36.7 C) 98.4 F (36.9 C)  TempSrc: Oral Oral Oral Oral  SpO2: 97% 94% 96% 96%  Weight:      Height:        Intake/Output Summary (Last 24 hours) at 08/05/2023 0826 Last data filed at 08/05/2023 0500 Gross per 24 hour  Intake 3 ml  Output 1900 ml  Net -1897 ml   Filed Weights   07/28/23 1130  Weight: 90.6 kg    Examination:  General: On room air.  No acute distress.  Looks chronically ill and deconditioned.  Slow to respond.  Flat affect. respiratory: Bilateral  decreased breath sounds at bases bilaterally with some crackles CVS: Currently rate controlled; S1-S2 heard  abdominal: Soft, nontender, distended slightly, no organomegaly; bowel sounds heard extremities: Trace lower extremity edema; no clubbing.     Data Reviewed: I have personally reviewed following labs and imaging studies  CBC: Recent Labs  Lab 07/31/23 0713 08/01/23 0539 08/03/23 0704 08/04/23 0544 08/05/23 0605  WBC 15.5* 11.6* 13.0* 14.5* 14.1*  HGB 9.8* 9.9* 10.7* 10.4* 10.3*  HCT 32.3* 32.7* 34.0* 33.6* 33.8*  MCV 98.5 97.0 95.0 96.0 97.4  PLT 80* 95* 196 255 332   Basic Metabolic Panel: Recent Labs  Lab 08/01/23 0539 08/02/23 0749 08/03/23 0704 08/04/23 0544 08/05/23 0605  NA 138 138 137 138 134*  K 3.3* 3.4* 3.8 3.7 3.7  CL 110 107 108 110 105  CO2 23 23 23 22 22   GLUCOSE 110* 130* 98 107* 106*  BUN 27* 30* 31* 37* 31*  CREATININE 1.37* 1.27* 1.15 1.20 1.34*  CALCIUM  8.2* 8.6* 8.3* 8.4* 8.3*  MG 2.0 2.0 2.1 1.9 2.0   GFR: Estimated Creatinine Clearance: 52.3 mL/min (A) (by C-G formula based on SCr of 1.34 mg/dL (H)). Liver Function Tests: Recent Labs  Lab 08/03/23 0704 08/04/23 0544 08/05/23 0605  AST 24 24 26   ALT 25 25 24   ALKPHOS 48 46 48  BILITOT 0.4 0.5 0.4  PROT 7.8 6.9 7.0  ALBUMIN 2.8* 2.8* 2.6*   No results for input(s): "LIPASE", "AMYLASE" in the last 168 hours. No results for input(s): "AMMONIA" in the last 168 hours. Coagulation Profile: No results for input(s): "INR", "PROTIME" in the last 168 hours.  Cardiac Enzymes: No results for input(s): "CKTOTAL", "CKMB", "CKMBINDEX", "TROPONINI" in the last 168 hours. BNP (last 3 results) No results for input(s): "PROBNP" in the last 8760 hours. HbA1C: No results for input(s): "HGBA1C" in the last 72 hours. CBG: No results for input(s): "GLUCAP" in the last 168 hours. Lipid Profile: No results for input(s): "CHOL", "HDL", "LDLCALC", "TRIG", "CHOLHDL", "LDLDIRECT" in the last 72  hours. Thyroid  Function Tests: No results for input(s): "TSH", "T4TOTAL", "FREET4", "T3FREE", "THYROIDAB" in the last 72 hours. Anemia Panel: No results for input(s): "VITAMINB12", "FOLATE", "FERRITIN", "TIBC", "IRON", "RETICCTPCT" in the last 72 hours. Sepsis Labs: No results for input(s): "PROCALCITON", "LATICACIDVEN" in the last 168 hours.   Recent Results (from the past 240 hours)  Blood Culture (routine x 2)     Status: None   Collection Time: 07/28/23 11:52 AM   Specimen: BLOOD RIGHT FOREARM  Result Value Ref Range Status   Specimen Description   Final    BLOOD RIGHT FOREARM Performed at [facility], [address] [place], [place] [address]    Special Requests   Final    BOTTLES DRAWN AEROBIC AND ANAEROBIC Blood Culture results may not be optimal due to an inadequate volume of blood received in culture bottles Performed at [facility], [address] [place], [place] [address]    Culture   Final    NO GROWTH 5 DAYS Performed at [facility], [address], [place], [place] [address]    Report Status 08/02/2023 FINAL  Final  Blood Culture (routine x 2)     Status: None   Collection Time: 07/28/23 12:19 PM   Specimen: BLOOD RIGHT HAND  Result Value Ref Range Status   Specimen Description   Final    BLOOD RIGHT HAND Performed at [facility], [address] [place], [place] [address]    Special Requests   Final    BOTTLES DRAWN AEROBIC AND ANAEROBIC Blood Culture adequate volume Performed at [facility], [address], [place], [place] [address]    Culture   Final    NO GROWTH 5 DAYS Performed at [facility], [address] [place], [place] [address]    Report Status 08/02/2023 FINAL  Final  Urine Culture     Status: Abnormal   Collection Time: 07/28/23  1:35 PM   Specimen: Urine, Random  Result Value Ref Range Status   Specimen Description   Final    URINE,  RANDOM Performed at [facility], [address] [place], [place] [address]    Special Requests   Final    NONE Reflexed from [id number] Performed at [facility], [address] [place], [place] [address]    Culture >=100,000 COLONIES/mL ESCHERICHIA COLI (A)  Final   Report Status 07/30/2023 FINAL  Final   Organism ID, Bacteria ESCHERICHIA COLI (A)  Final      Susceptibility   Escherichia coli - MIC*    AMPICILLIN 4 SENSITIVE Sensitive     CEFAZOLIN <=4 SENSITIVE Sensitive     CEFEPIME <=0.12 SENSITIVE Sensitive     CEFTRIAXONE  <=0.25 SENSITIVE Sensitive     CIPROFLOXACIN  <=0.25 SENSITIVE Sensitive     GENTAMICIN  <=1 SENSITIVE Sensitive     IMIPENEM <=0.25 SENSITIVE Sensitive     NITROFURANTOIN <=16 SENSITIVE Sensitive     TRIMETH/SULFA <=20 SENSITIVE Sensitive     AMPICILLIN/SULBACTAM <=2 SENSITIVE Sensitive     PIP/TAZO <=4 SENSITIVE Sensitive ug/mL    * >=100,000 COLONIES/mL ESCHERICHIA COLI         Radiology Studies: No results found.        Scheduled Meds:  baclofen   10 mg Oral BID   buPROPion   150 mg Oral Daily   Chlorhexidine  Gluconate Cloth  6 each Topical Daily   gabapentin   100 mg Oral TID   levETIRAcetam   1,500 mg Oral BID   melatonin  5 mg Oral QHS   pantoprazole   40 mg Oral Daily   rivaroxaban   20 mg Oral Q supper   rosuvastatin   40 mg Oral QHS   saccharomyces boulardii  250 mg Oral BID   sertraline   100 mg Oral Daily   sodium chloride  flush  3 mL Intravenous Q12H   Continuous Infusions:           Audria Leather, MD [facility] 08/05/2023, 8:26 AM

## 2023-08-06 ENCOUNTER — Encounter (HOSPITAL_COMMUNITY): Payer: Self-pay | Admitting: Physical Medicine & Rehabilitation

## 2023-08-06 ENCOUNTER — Inpatient Hospital Stay (HOSPITAL_COMMUNITY)
Admission: AD | Admit: 2023-08-06 | Discharge: 2023-08-14 | DRG: 057 | Disposition: A | Discharge status: Home Health | Source: Other Acute Inpatient Hospital | Attending: Physical Medicine & Rehabilitation | Admitting: Physical Medicine & Rehabilitation

## 2023-08-06 ENCOUNTER — Other Ambulatory Visit: Payer: Self-pay

## 2023-08-06 DIAGNOSIS — N1831 Chronic kidney disease, stage 3a: Secondary | ICD-10-CM | POA: Diagnosis present

## 2023-08-06 DIAGNOSIS — G40909 Epilepsy, unspecified, not intractable, without status epilepticus: Secondary | ICD-10-CM | POA: Diagnosis not present

## 2023-08-06 DIAGNOSIS — Z7982 Long term (current) use of aspirin: Secondary | ICD-10-CM | POA: Diagnosis not present

## 2023-08-06 DIAGNOSIS — D62 Acute posthemorrhagic anemia: Secondary | ICD-10-CM | POA: Diagnosis not present

## 2023-08-06 DIAGNOSIS — I69398 Other sequelae of cerebral infarction: Secondary | ICD-10-CM | POA: Diagnosis not present

## 2023-08-06 DIAGNOSIS — N179 Acute kidney failure, unspecified: Secondary | ICD-10-CM | POA: Diagnosis not present

## 2023-08-06 DIAGNOSIS — R5381 Other malaise: Secondary | ICD-10-CM | POA: Diagnosis not present

## 2023-08-06 DIAGNOSIS — R140 Abdominal distension (gaseous): Secondary | ICD-10-CM | POA: Diagnosis not present

## 2023-08-06 DIAGNOSIS — D649 Anemia, unspecified: Secondary | ICD-10-CM | POA: Diagnosis present

## 2023-08-06 DIAGNOSIS — F419 Anxiety disorder, unspecified: Secondary | ICD-10-CM | POA: Diagnosis not present

## 2023-08-06 DIAGNOSIS — N183 Chronic kidney disease, stage 3 unspecified: Secondary | ICD-10-CM | POA: Diagnosis present

## 2023-08-06 DIAGNOSIS — I69354 Hemiplegia and hemiparesis following cerebral infarction affecting left non-dominant side: Principal | ICD-10-CM

## 2023-08-06 DIAGNOSIS — E876 Hypokalemia: Secondary | ICD-10-CM | POA: Diagnosis not present

## 2023-08-06 DIAGNOSIS — D6862 Lupus anticoagulant syndrome: Secondary | ICD-10-CM | POA: Diagnosis not present

## 2023-08-06 DIAGNOSIS — G629 Polyneuropathy, unspecified: Secondary | ICD-10-CM | POA: Diagnosis present

## 2023-08-06 DIAGNOSIS — Z9081 Acquired absence of spleen: Secondary | ICD-10-CM

## 2023-08-06 DIAGNOSIS — K219 Gastro-esophageal reflux disease without esophagitis: Secondary | ICD-10-CM

## 2023-08-06 DIAGNOSIS — K5900 Constipation, unspecified: Secondary | ICD-10-CM | POA: Diagnosis present

## 2023-08-06 DIAGNOSIS — I129 Hypertensive chronic kidney disease with stage 1 through stage 4 chronic kidney disease, or unspecified chronic kidney disease: Secondary | ICD-10-CM | POA: Diagnosis present

## 2023-08-06 DIAGNOSIS — Z91014 Allergy to mammalian meats: Secondary | ICD-10-CM

## 2023-08-06 DIAGNOSIS — R339 Retention of urine, unspecified: Secondary | ICD-10-CM | POA: Diagnosis present

## 2023-08-06 DIAGNOSIS — G4733 Obstructive sleep apnea (adult) (pediatric): Secondary | ICD-10-CM | POA: Diagnosis not present

## 2023-08-06 DIAGNOSIS — N319 Neuromuscular dysfunction of bladder, unspecified: Secondary | ICD-10-CM | POA: Diagnosis present

## 2023-08-06 DIAGNOSIS — Z79899 Other long term (current) drug therapy: Secondary | ICD-10-CM

## 2023-08-06 DIAGNOSIS — R569 Unspecified convulsions: Secondary | ICD-10-CM | POA: Diagnosis not present

## 2023-08-06 DIAGNOSIS — K5641 Fecal impaction: Secondary | ICD-10-CM | POA: Diagnosis not present

## 2023-08-06 DIAGNOSIS — D693 Immune thrombocytopenic purpura: Secondary | ICD-10-CM | POA: Diagnosis not present

## 2023-08-06 DIAGNOSIS — R252 Cramp and spasm: Secondary | ICD-10-CM | POA: Diagnosis not present

## 2023-08-06 DIAGNOSIS — G8114 Spastic hemiplegia affecting left nondominant side: Secondary | ICD-10-CM | POA: Diagnosis not present

## 2023-08-06 DIAGNOSIS — F32A Depression, unspecified: Secondary | ICD-10-CM | POA: Diagnosis present

## 2023-08-06 DIAGNOSIS — I2510 Atherosclerotic heart disease of native coronary artery without angina pectoris: Secondary | ICD-10-CM | POA: Diagnosis not present

## 2023-08-06 DIAGNOSIS — Z87891 Personal history of nicotine dependence: Secondary | ICD-10-CM | POA: Diagnosis not present

## 2023-08-06 DIAGNOSIS — Z86718 Personal history of other venous thrombosis and embolism: Secondary | ICD-10-CM

## 2023-08-06 DIAGNOSIS — A4151 Sepsis due to Escherichia coli [E. coli]: Secondary | ICD-10-CM

## 2023-08-06 DIAGNOSIS — R197 Diarrhea, unspecified: Secondary | ICD-10-CM | POA: Diagnosis present

## 2023-08-06 DIAGNOSIS — R8271 Bacteriuria: Secondary | ICD-10-CM

## 2023-08-06 DIAGNOSIS — Z7901 Long term (current) use of anticoagulants: Secondary | ICD-10-CM | POA: Diagnosis not present

## 2023-08-06 DIAGNOSIS — M792 Neuralgia and neuritis, unspecified: Secondary | ICD-10-CM | POA: Diagnosis present

## 2023-08-06 LAB — BASIC METABOLIC PANEL WITH GFR
Anion gap: 8 (ref 5–15)
BUN: 28 mg/dL — ABNORMAL HIGH (ref 8–23)
CO2: 21 mmol/L — ABNORMAL LOW (ref 22–32)
Calcium: 8.6 mg/dL — ABNORMAL LOW (ref 8.9–10.3)
Chloride: 108 mmol/L (ref 98–111)
Creatinine, Ser: 1.27 mg/dL — ABNORMAL HIGH (ref 0.61–1.24)
GFR, Estimated: 58 mL/min — ABNORMAL LOW (ref >60)
Glucose, Bld: 100 mg/dL — ABNORMAL HIGH (ref 70–99)
Potassium: 3.6 mmol/L (ref 3.5–5.1)
Sodium: 137 mmol/L (ref 135–145)

## 2023-08-06 LAB — CBC
HCT: 32.9 % — ABNORMAL LOW (ref 39.0–52.0)
Hemoglobin: 9.9 g/dL — ABNORMAL LOW (ref 13.0–17.0)
MCH: 29.3 pg (ref 26.0–34.0)
MCHC: 30.1 g/dL (ref 30.0–36.0)
MCV: 97.3 fL (ref 80.0–100.0)
Platelets: 375 10*3/uL (ref 150–400)
RBC: 3.38 MIL/uL — ABNORMAL LOW (ref 4.22–5.81)
RDW: 17.6 % — ABNORMAL HIGH (ref 11.5–15.5)
WBC: 11.8 10*3/uL — ABNORMAL HIGH (ref 4.0–10.5)
nRBC: 3 % — ABNORMAL HIGH (ref 0.0–0.2)

## 2023-08-06 LAB — MAGNESIUM: Magnesium: 1.9 mg/dL (ref 1.7–2.4)

## 2023-08-06 MED ORDER — CHLORHEXIDINE GLUCONATE CLOTH 2 % EX PADS: 6.0000 | MEDICATED_PAD | Freq: Every day | CUTANEOUS | Status: DC

## 2023-08-06 MED ORDER — GUAIFENESIN-DM 100-10 MG/5ML PO SYRP: 5.0000 mL | ORAL_SOLUTION | Freq: Four times a day (QID) | ORAL | Status: DC | PRN

## 2023-08-06 MED ORDER — CALCIUM POLYCARBOPHIL 625 MG PO TABS
625.0000 mg | ORAL_TABLET | Freq: Every day | ORAL | Status: DC
  Administered 2023-08-06 – 2023-08-14 (×9): 625 mg via ORAL
  Filled 2023-08-06 (×9): qty 1

## 2023-08-06 MED ORDER — POLYVINYL ALCOHOL 1.4 % OP SOLN
1.0000 [drp] | OPHTHALMIC | Status: DC | PRN
  Filled 2023-08-06: qty 15

## 2023-08-06 MED ORDER — MELATONIN 5 MG PO TABS
5.0000 mg | ORAL_TABLET | Freq: Every day | ORAL | Status: DC
  Administered 2023-08-06 – 2023-08-13 (×8): 5 mg via ORAL
  Filled 2023-08-06 (×8): qty 1

## 2023-08-06 MED ORDER — FLEET ENEMA RE ENEM: 1.0000 | ENEMA | Freq: Once | RECTAL | Status: DC | PRN

## 2023-08-06 MED ORDER — POLYETHYLENE GLYCOL 3350 17 G PO PACK: 17.0000 g | PACK | Freq: Every day | ORAL | Status: DC | PRN

## 2023-08-06 MED ORDER — ALUM & MAG HYDROXIDE-SIMETH 200-200-20 MG/5ML PO SUSP: 30.0000 mL | ORAL | Status: DC | PRN

## 2023-08-06 MED ORDER — PANTOPRAZOLE SODIUM 40 MG PO TBEC
40.0000 mg | DELAYED_RELEASE_TABLET | Freq: Every day | ORAL | Status: DC
  Administered 2023-08-07 – 2023-08-14 (×8): 40 mg via ORAL
  Filled 2023-08-06 (×8): qty 1

## 2023-08-06 MED ORDER — MELATONIN 5 MG PO TABS: 5.0000 mg | ORAL_TABLET | Freq: Every evening | ORAL | Status: DC | PRN

## 2023-08-06 MED ORDER — ACETAMINOPHEN 325 MG PO TABS
325.0000 mg | ORAL_TABLET | ORAL | Status: DC | PRN
  Administered 2023-08-09: 650 mg via ORAL
  Filled 2023-08-06: qty 2

## 2023-08-06 MED ORDER — PROCHLORPERAZINE EDISYLATE 10 MG/2ML IJ SOLN: 5.0000 mg | Freq: Four times a day (QID) | INTRAMUSCULAR | Status: DC | PRN

## 2023-08-06 MED ORDER — PROCHLORPERAZINE MALEATE 5 MG PO TABS: 5.0000 mg | ORAL_TABLET | Freq: Four times a day (QID) | ORAL | Status: DC | PRN

## 2023-08-06 MED ORDER — RIVAROXABAN 20 MG PO TABS
20.0000 mg | ORAL_TABLET | Freq: Every day | ORAL | Status: DC
  Administered 2023-08-06 – 2023-08-13 (×8): 20 mg via ORAL
  Filled 2023-08-06 (×8): qty 1

## 2023-08-06 MED ORDER — LEVETIRACETAM 750 MG PO TABS
1500.0000 mg | ORAL_TABLET | Freq: Two times a day (BID) | ORAL | Status: DC
  Administered 2023-08-06 – 2023-08-14 (×16): 1500 mg via ORAL
  Filled 2023-08-06 (×7): qty 2
  Filled 2023-08-06: qty 6
  Filled 2023-08-06 (×4): qty 2
  Filled 2023-08-06: qty 6
  Filled 2023-08-06 (×3): qty 2

## 2023-08-06 MED ORDER — BISACODYL 10 MG RE SUPP
10.0000 mg | Freq: Every day | RECTAL | Status: DC | PRN
  Filled 2023-08-06: qty 1

## 2023-08-06 MED ORDER — SACCHAROMYCES BOULARDII 250 MG PO CAPS
250.0000 mg | ORAL_CAPSULE | Freq: Two times a day (BID) | ORAL | Status: DC
Start: 2023-08-06 — End: 2023-08-14
  Administered 2023-08-06 – 2023-08-14 (×16): 250 mg via ORAL
  Filled 2023-08-06 (×16): qty 1

## 2023-08-06 MED ORDER — ROSUVASTATIN CALCIUM 20 MG PO TABS
40.0000 mg | ORAL_TABLET | Freq: Every day | ORAL | Status: DC
  Administered 2023-08-06 – 2023-08-13 (×8): 40 mg via ORAL
  Filled 2023-08-06 (×8): qty 2

## 2023-08-06 MED ORDER — PROCHLORPERAZINE 25 MG RE SUPP
12.5000 mg | Freq: Four times a day (QID) | RECTAL | Status: DC | PRN
  Filled 2023-08-06: qty 1

## 2023-08-06 MED ORDER — ASPIRIN 81 MG PO TBEC
81.0000 mg | DELAYED_RELEASE_TABLET | Freq: Every day | ORAL | Status: DC
  Administered 2023-08-07 – 2023-08-14 (×8): 81 mg via ORAL
  Filled 2023-08-06 (×8): qty 1

## 2023-08-06 MED ORDER — GABAPENTIN 100 MG PO CAPS
100.0000 mg | ORAL_CAPSULE | Freq: Three times a day (TID) | ORAL | Status: DC
  Administered 2023-08-06 – 2023-08-14 (×24): 100 mg via ORAL
  Filled 2023-08-06 (×25): qty 1

## 2023-08-06 MED ORDER — SERTRALINE HCL 100 MG PO TABS
100.0000 mg | ORAL_TABLET | Freq: Every day | ORAL | Status: DC
  Administered 2023-08-07 – 2023-08-14 (×8): 100 mg via ORAL
  Filled 2023-08-06 (×9): qty 1

## 2023-08-06 MED ORDER — LIDOCAINE HCL URETHRAL/MUCOSAL 2 % EX GEL
CUTANEOUS | Status: DC | PRN
  Filled 2023-08-06: qty 6

## 2023-08-06 MED ORDER — BUPROPION HCL ER (XL) 150 MG PO TB24
150.0000 mg | ORAL_TABLET | Freq: Every day | ORAL | Status: DC
  Administered 2023-08-07 – 2023-08-14 (×8): 150 mg via ORAL
  Filled 2023-08-06 (×8): qty 1

## 2023-08-06 MED ORDER — BACLOFEN 10 MG PO TABS
10.0000 mg | ORAL_TABLET | Freq: Two times a day (BID) | ORAL | Status: DC
  Administered 2023-08-06 – 2023-08-14 (×16): 10 mg via ORAL
  Filled 2023-08-06 (×16): qty 1

## 2023-08-06 NOTE — Progress Notes (Signed)
 Mark [name] is doing okay.  He is awaiting placement over at [facility].  He has been there before.  Hopefully, he will be able to go there soon.  His platelet count is doing well.  Today, his platelet count is 375,000.  Again I am not surprised by this.  He has had no bleeding.  He is on anticoagulation because of the past CVA.  His appetite is okay.  He has had no nausea or vomiting.  He has had resolution of the UTI.  I will recheck his urine culture and urinalysis.  He has had no rashes.  He has had no mouth sores.  He has had no headache.  His vital signs are temperature 98.5.  Pulse 65.  Blood pressure 122/67.  His lungs are clear.  Cardiac exam regular rate and rhythm.  Abdomen is soft.  Bowel sounds are present.  He has no fluid wave.  There is no palpable liver or spleen tip.  Extremity shows no clubbing, cyanosis or edema.  He does have the weakness over on the left side.  Skin exam shows no ecchymoses or petechia.  Neurological exam shows the weakness on the left side.  Again, Mark [name] had the E. coli UTI.  This was pansensitive.  I will recheck his urine culture.  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  Again, he would be a candidate for Rituxan .  We will await placement over at [facility].  Again, I do appreciate the great care that he is getting from everybody on 6 E.   Rayleen Cal, MD  Psalm 6:2

## 2023-08-06 NOTE — H&P (Shared)
 Physical Medicine and Rehabilitation Admission H&P    Chief Complaint  Patient presents with   Functional deficits due to urosepsis   Hx of stroke with spastic left hemiparesis.     HPI:  Mark [name] is a 78 year old male with history of  HTN, OSA, CVA w/L-HP,  lupus anticoagulant- on Xarelto , neurogenic bladder, seizure d/o, relapsed ITP with severe thrombocytopenia treated with IVIG, Nplate  and decadron  with hospital stay 03/21-->CIR and d/c to home 07/22/23. He was readmitted to [facility] on 07/28/23 with rigors and chills, hypotension and leucocytosis [id number] due to urosepsis. Work up revealed E coli UTI/BC negative. He was treated with IVF and 7 day course of antibiotics (Ceftriaxone  to cefadoxil) thru 04/20.    He started having drop in platelets on 04/15 and was treated with IV decadron , Nplate  and IVIG per Dr. Birt Bulla input. ASA was discontinued and Xarelto  placed on hold. Foley was placed to manage neurogenic bladder.  Renal failure is resolving, hypokalemia has resolved and platelets have recovered to  375 therefore Xarelto  resumed and recommendations to resume ASA at discharge. Therapy was consulted to work with patient who is noted to be limited by weakness due to debility, has balance deficits and is requiring min assist overall. He was at supervision level PTA and CIR was recommended due to functional decline.      Review of Systems  Constitutional:  Negative for chills and fever.  HENT:  Positive for hearing loss.   Eyes:  Negative for blurred vision and double vision.  Respiratory:  Negative for cough and hemoptysis.   Cardiovascular:  Negative for chest pain and palpitations.  Gastrointestinal:  Positive for diarrhea. Negative for heartburn and nausea.  Genitourinary:  Negative for dysuria and urgency.  Musculoskeletal:  Negative for myalgias and neck pain.  Neurological:  Positive for focal weakness and weakness. Negative for dizziness and headaches.   Psychiatric/Behavioral:  The patient is not nervous/anxious and does not have insomnia.      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    Lupus anticoagulant disorder (HCC)    Lupus anticoagulant syndrome (HCC)    Multiple closed anterior-posterior compression fractures of pelvis (HCC)    Seizures (HCC)    last seizure 10 years ago approx    Sleep apnea    cpap broken x 1 year per wife    Stroke (HCC) 02/27/2011   Left side weakness    Past Surgical History:  Procedure Laterality Date   blood clot Right    surgical removal   CHOLECYSTECTOMY     CORONARY ANGIOPLASTY  1992   CYSTOGRAM N/A 02/17/2020   Procedure: CYSTOGRAM;  Surgeon: Andrez Banker, MD;  Location: [facility];  Service: Urology;  Laterality: N/A;   CYSTOSCOPY WITH RETROGRADE PYELOGRAM, URETEROSCOPY AND STENT PLACEMENT Bilateral 02/17/2020   Procedure: Deliliah Fender  URETEROSCOPY AND STENT PLACEMENT;  Surgeon: Andrez Banker, MD;  Location: [facility];  Service: Urology;  Laterality: Bilateral;   SPLENECTOMY, TOTAL     vertebralplasty  History reviewed. No pertinent family history.   Social History:  reports that he quit smoking about 38 years ago. His smoking use included cigarettes. He started smoking about 55 years ago. He has a 16.6 pack-year smoking history. He has never used smokeless tobacco. He reports that he does not currently use drugs. He reports that he does not drink alcohol .   Allergies  Allergen Reactions   Diphenhydramine  Other (See Comments)    Affects kidneys   Famotidine  Other (See Comments)    Affects kidneys    Beef-Derived Drug Products Other (See Comments)    Vegetarian   Chicken Protein Other (See Comments)    Vegetarian    Pork-Derived Products Other (See Comments)    Vegetarian     Medications Prior to Admission  Medication Sig Dispense Refill   amLODipine  (NORVASC ) 10 MG tablet Take 10 mg by mouth at bedtime.     aspirin  EC 81 MG tablet Take 81 mg by mouth daily. Swallow whole.     baclofen  (LIORESAL ) 10 MG tablet Take 10 mg by mouth in the morning and at bedtime.     buPROPion  (WELLBUTRIN  XL) 150 MG 24 hr tablet Take 150 mg by mouth in the morning.     Cholecalciferol  (VITAMIN D3) 50 MCG (2000 UT) TABS Take 2,000 Units by mouth at bedtime.     fluticasone  (FLONASE ) 50 MCG/ACT nasal spray Place 2 sprays into both nostrils daily as needed for allergies or rhinitis.     gabapentin  (NEURONTIN ) 100 MG capsule Take 1 capsule (100 mg total) by mouth 3 (three) times daily. 90 capsule 0   levETIRAcetam  (KEPPRA ) 500 MG tablet Take 1,500 mg by mouth in the morning and at bedtime.     melatonin 5 MG TABS Take 1 tablet (5 mg total) by mouth at bedtime as needed.     nystatin  (MYCOSTATIN ) 100000 UNIT/ML suspension Take 5 mLs (500,000 Units total) by mouth 4 (four) times daily. 60 mL 0   omeprazole (PRILOSEC) 40 MG capsule Take 40 mg by mouth daily before breakfast.     polyethylene glycol (MIRALAX  / GLYCOLAX ) packet Take 17 g by mouth daily as needed. 14 each 0   rivaroxaban  (XARELTO ) 20 MG TABS tablet Take 1 tablet (20 mg total) by mouth daily with supper. 30 tablet 0   rosuvastatin  (CRESTOR ) 40 MG tablet Take 40 mg by mouth at bedtime.     sertraline  (ZOLOFT ) 100 MG tablet Take 200 mg by mouth in the morning.      THERATEARS 0.25 % SOLN Place 1 drop into both eyes 2 (two) times daily as needed (for redness or irritation).     TYLENOL  500 MG tablet Take 500 mg by mouth See admin instructions. Take 500 mg by mouth at bedtime and an additional 500 mg once a day as needed for pain or headaches        Home: Home Living Family/patient expects to be discharged to:: Private residence Living Arrangements: Spouse/significant other Available Help at Discharge: Family, Available 24 hours/day Type of Home: House Home Access: Ramped entrance Entrance Stairs-Number of Steps: 5 STE prefers to use steps not ramp Entrance Stairs-Rails: Left, Right, Can reach both Home Layout: One level Bathroom Shower/Tub: Engineer, manufacturing systems: Handicapped height Bathroom Accessibility: Yes Home Equipment: Shower seat, BSC/3in1, Cane - single point, Grab bars - toilet, Hand held shower head, Grab bars - tub/shower, Adaptive equipment, Wheelchair - manual, Transport chair Additional Comments: pt reports being ambulatory for household distances using hemi  walker. Pt has L PFRW, WC, shower chair/bench, bedside commode. Pt reports being independnet with cathing at home prior to admission.  Lives With: Spouse   Functional History: Prior Function Prior Level of Function : Needs assist  Cognitive Assist : Mobility (cognitive), ADLs (cognitive) Mobility (Cognitive): Intermittent cues ADLs (Cognitive): Intermittent cues Physical Assist : Mobility (physical), ADLs (physical) Mobility (physical): Transfers, Gait, Stairs ADLs (physical): Bathing, IADLs Mobility Comments: wife utilizes gait belt as needed, pt amb with hemi walker in the home,,DC from [facility] 07/21/23 ADLs Comments: wife assists with bathing, active wtih HHOT and PT  Functional Status:  Mobility: Bed Mobility Overal bed mobility: Needs Assistance Bed Mobility: Supine to Sit Rolling: Used rails, Contact guard assist Supine to sit: Contact guard, HOB elevated, Used rails Sit to supine: Mod assist General bed mobility comments: up in recliner Transfers Overall transfer level: Needs assistance Equipment used: Hemi-walker Transfers: Sit to/from Stand Sit to Stand: Min assist Bed to/from chair/wheelchair/BSC transfer type:: Stand  pivot Stand pivot transfers: Mod assist General transfer comment: min A to power up fom recliner, min A to control descent to toilet with VCs to use grab bar Ambulation/Gait Ambulation/Gait assistance: Min assist Gait Distance (Feet): 50 Feet Assistive device: Hemi-walker Gait Pattern/deviations: Step-to pattern, Decreased weight shift to left, Decreased dorsiflexion - left, Narrow base of support General Gait Details: step to gait pattern with trunk lean to R while advancing LLE, good hemiwalker placement, verbal cues for intermittent hemiwalker placement and path to safely navigate around obstacles at safe distance, no overt loss of balance; distance limited by pt needing to have BM Gait velocity: decreased    ADL: ADL Overall ADL's : Needs assistance/impaired Eating/Feeding: Set up Eating/Feeding Details (indicate cue type and reason): one handed techniques Grooming: Set up, Sitting, Wash/dry hands, Wash/dry face, Oral care, Brushing hair Grooming Details (indicate cue type and reason): uses L hand 20% of time as gross assist only Upper Body Bathing: Minimal assistance, Sitting Upper Body Bathing Details (indicate cue type and reason): EOB Lower Body Bathing: Maximal assistance, Sitting/lateral leans, Sit to/from stand Upper Body Dressing : Minimal assistance, Sitting (EOB) Lower Body Dressing: Maximal assistance, Sitting/lateral leans, Sit to/from stand (EOB including L AFO) Lower Body Dressing Details (indicate cue type and reason): patient was max A to don L shoe and min A to don R shoe with increased time. patient indicated using long handled shoe horn at home. Toilet Transfer: Moderate assistance, +2 for physical assistance, +2 for safety/equipment, BSC/3in1 Toilet Transfer Details (indicate cue type and reason): use of hemi walker for SPT Toileting- Clothing Manipulation and Hygiene: Maximal assistance, Sitting/lateral lean, Sit to/from stand Functional mobility during ADLs:  Moderate assistance (hemi walker) General ADL Comments: educated for patient to have wife don L AFO bed level as pt reports wife has back issues  Cognition: Cognition Orientation Level: Oriented X4 Cognition Arousal: Alert Behavior During Therapy: WFL for tasks assessed/performed   Blood pressure 122/67, pulse 65, temperature 98.5 F (36.9 C), temperature source Oral, resp. rate 18, height 5\' 11"  (1.803 m), weight 90.6 kg, SpO2 95%. Physical Exam Vitals and nursing note reviewed.  Neurological:     Mental Status: He is alert and oriented to person, place, and time.     Results for orders placed or performed during the hospital encounter of 07/28/23 (from the past 48 hours)  CBC     Status: Abnormal   Collection Time: 08/05/23  6:05 AM  Result Value Ref Range   WBC 14.1 (H)  4.0 - 10.5 K/uL    Comment: WHITE COUNT CONFIRMED ON SMEAR   RBC 3.47 (L) 4.22 - 5.81 MIL/uL   Hemoglobin 10.3 (L) 13.0 - 17.0 g/dL   HCT 78.2 (L) 95.6 - 21.3 %   MCV 97.4 80.0 - 100.0 fL   MCH 29.7 26.0 - 34.0 pg   MCHC 30.5 30.0 - 36.0 g/dL   RDW 08.6 (H) 57.8 - 46.9 %   Platelets 332 150 - 400 K/uL   nRBC 3.1 (H) 0.0 - 0.2 %    Comment: Performed at [facility], [address], [place], [place] [address]  Comprehensive metabolic panel     Status: Abnormal   Collection Time: 08/05/23  6:05 AM  Result Value Ref Range   Sodium 134 (L) 135 - 145 mmol/L   Potassium 3.7 3.5 - 5.1 mmol/L   Chloride 105 98 - 111 mmol/L   CO2 22 22 - 32 mmol/L   Glucose, Bld 106 (H) 70 - 99 mg/dL    Comment: Glucose reference range applies only to samples taken after fasting for at least 8 hours.   BUN 31 (H) 8 - 23 mg/dL   Creatinine, Ser 8.41 (H) 0.61 - 1.24 mg/dL   Calcium  8.3 (L) 8.9 - 10.3 mg/dL   Total Protein 7.0 6.5 - 8.1 g/dL   Albumin 2.6 (L) 3.5 - 5.0 g/dL   AST 26 15 - 41 U/L   ALT 24 0 - 44 U/L   Alkaline Phosphatase 48 38 - 126 U/L   Total Bilirubin 0.4 0.0 - 1.2 mg/dL   GFR,  Estimated 54 (L) >60 mL/min    Comment: (NOTE) Calculated using the CKD-EPI Creatinine Equation (2021)    Anion gap 7 5 - 15    Comment: Performed at [facility], [address], [place], [place] [address]  Magnesium      Status: None   Collection Time: 08/05/23  6:05 AM  Result Value Ref Range   Magnesium  2.0 1.7 - 2.4 mg/dL    Comment: Performed at [facility], [address] [place], [place] [address]  CBC     Status: Abnormal   Collection Time: 08/06/23  6:07 AM  Result Value Ref Range   WBC 11.8 (H) 4.0 - 10.5 K/uL    Comment: WHITE COUNT CONFIRMED ON SMEAR   RBC 3.38 (L) 4.22 - 5.81 MIL/uL   Hemoglobin 9.9 (L) 13.0 - 17.0 g/dL   HCT 53.6 (L) 64.4 - 03.4 %   MCV 97.3 80.0 - 100.0 fL   MCH 29.3 26.0 - 34.0 pg   MCHC 30.1 30.0 - 36.0 g/dL   RDW 74.2 (H) 59.5 - 63.8 %   Platelets 375 150 - 400 K/uL   nRBC 3.0 (H) 0.0 - 0.2 %    Comment: Performed at [facility], [address], [place], [place] [address]  Basic metabolic panel with GFR     Status: Abnormal   Collection Time: 08/06/23  6:07 AM  Result Value Ref Range   Sodium 137 135 - 145 mmol/L   Potassium 3.6 3.5 - 5.1 mmol/L   Chloride 108 98 - 111 mmol/L   CO2 21 (L) 22 - 32 mmol/L   Glucose, Bld 100 (H) 70 - 99 mg/dL    Comment: Glucose reference range applies only to samples taken after fasting for at least 8 hours.   BUN 28 (H) 8 - 23 mg/dL   Creatinine, Ser 3.29 (H)  0.61 - 1.24 mg/dL   Calcium  8.6 (L) 8.9 - 10.3 mg/dL   GFR, Estimated 58 (L) >60 mL/min    Comment: (NOTE) Calculated using the CKD-EPI Creatinine Equation (2021)    Anion gap 8 5 - 15    Comment: Performed at [facility], [address], [place], [place] [address]  Magnesium      Status: None   Collection Time: 08/06/23  6:07 AM  Result Value Ref Range   Magnesium  1.9 1.7 - 2.4 mg/dL    Comment: Performed at [facility], [address],  [place], [place] [address]   *Note: Due to a large number of results and/or encounters for the requested time period, some results have not been displayed. A complete set of results can be found in Results Review.   No results found.    Blood pressure 122/67, pulse 65, temperature 98.5 F (36.9 C), temperature source Oral, resp. rate 18, height 5\' 11"  (1.803 m), weight 90.6 kg, SpO2 95%.  Medical Problem List and Plan: 1. Functional deficits secondary to ***  -patient may *** shower  -ELOS/Goals: *** 2.  Lupus anticoagulant/Antithrombotics: -DVT/anticoagulation:  Pharmaceutical: Xarelto   -antiplatelet therapy: ASA resumed. 3. Pain Management: tylenol  prn 4. Mood/Behavior/Sleep: LCSW to evaluate and provide support prn.   -antipsychotic agents: N/A  --melatonin prn for insomnia.  5. Neuropsych/cognition: This patient is capable of making decisions on his own behalf. 6. Skin/Wound Care: routine pressure relief measures.  7. Fluids/Electrolytes/Nutrition: Monitor I/O. Check CMET in am.  8. Urosepsis/E coli UTI: Treated with antibiotics thru 04/20 9. Acute renal failure: resolving. Continue to monitor 10. HTN: Monitor BP TID 11. Chronic intermittent diarrhea: Managed with probiotics. Add fiber 12. Neurogenic bladder: Discontinue foley --Resume I/O caths 4-6 hours to keep bladder volumes < 350 cc 13. Leucocytosis: Resolving from 23.9--> 11.8.  14. ITP: Has improved and plans for Rituxan  on outpatient basis  --monitor for recurrent nose bleeds.  15. Severe Neuropathy BLE w/gait d/o: 16. H/o stroke with Left spastic HP: On low dose ASA, Xarelto  and baclofen  17. Seizure d/o: Stable on Keppra  1500 mg BID 18. H/o depression/anxiety: Managed with Sertraline  and Bupropion .      ***  Zelda Hickman, PA-C 08/06/2023

## 2023-08-06 NOTE — Progress Notes (Signed)
 Inpatient Rehab Admissions Coordinator:   Awaiting determination from [facility].  Spoke to Tiffany this morning who confirmed we would like an expedited response and she will process our request as such.  Will wait their decision.   Loye Rumble, PT, DPT Admissions Coordinator [phone] 08/06/23  9:18 AM

## 2023-08-06 NOTE — Plan of Care (Signed)
  Problem: Consults Goal: RH GENERAL PATIENT EDUCATION Description: See Patient Education module for education specifics. Outcome: Progressing   Problem: RH BOWEL ELIMINATION Goal: RH STG MANAGE BOWEL WITH ASSISTANCE Description: STG Manage Bowel with supervision Assistance. Outcome: Progressing   Problem: RH BLADDER ELIMINATION Goal: RH STG MANAGE BLADDER WITH ASSISTANCE Description: STG Manage Bladder With supervision Assistance Outcome: Progressing   Problem: RH SKIN INTEGRITY Goal: RH STG SKIN FREE OF INFECTION/BREAKDOWN Description: Manage skin free of infection/breakdown  with supervision assistance Outcome: Progressing

## 2023-08-06 NOTE — Progress Notes (Signed)
 Foley catheter removed and CHG baths discontinued.

## 2023-08-06 NOTE — Care Management Important Message (Signed)
 Important Message  Patient Details IM Letter given. Name: Mark [name] MRN: [id number] Date of Birth: Jan 17, 1946   Important Message Given:  Yes - Medicare IM     Curtiss Dowdy 08/06/2023, 11:22 AM

## 2023-08-06 NOTE — Plan of Care (Signed)

## 2023-08-06 NOTE — Progress Notes (Addendum)
 Inpatient Rehab Admissions Coordinator:   I did receive insurance approval for CIR.  I have a bed for this patient today.  Will update Dr. Nettey/Dr. Maria Shiner and see if they're ready for him to d/c today and then will update TOC and family.   1010: pt cleared for admit to CIR today.  I will notify pt and family and arrange transport with [facility].    1034: Pt will go to room 1m02 at [facility].  RN can call report at [phone].  [facility] has been notified pt ready for pickup and room at [facility] is ready.    Loye Rumble, PT, DPT Admissions Coordinator [phone] 08/06/23  9:41 AM

## 2023-08-06 NOTE — H&P (Signed)
 Physical Medicine and Rehabilitation Admission H&P    Chief Complaint  Patient presents with   Functional deficits due to urosepsis   Hx of stroke with spastic left hemiparesis.     HPI:  Mark [name] is a 78 year old male with history of  HTN, OSA, CVA w/L-HP,  lupus anticoagulant- on Xarelto , neurogenic bladder, seizure d/o, relapsed ITP with severe thrombocytopenia treated with IVIG, Nplate  and decadron  with hospital stay 03/21-->CIR and d/c to home 07/22/23. He was readmitted to [facility] on 07/28/23 with rigors and chills, hypotension and leucocytosis [id number] due to urosepsis. Work up revealed E coli UTI/BC negative. He was treated with IVF and 7 day course of antibiotics (Ceftriaxone  to cefadoxil) thru 04/20.    He started having drop in platelets on 04/15 and was treated with IV decadron , Nplate  and IVIG per Dr. Birt Bulla input. ASA was discontinued and Xarelto  placed on hold. Foley was placed to manage neurogenic bladder.  Renal failure is resolving, hypokalemia has resolved and platelets have recovered to  375 therefore Xarelto  resumed and recommendations to resume ASA at discharge. Therapy was consulted to work with patient who is noted to be limited by weakness due to debility, has balance deficits and is requiring min assist overall. He was at supervision level PTA and CIR was recommended due to functional decline.      Review of Systems  Constitutional:  Negative for chills and fever.  HENT:  Positive for hearing loss.   Eyes:  Negative for blurred vision and double vision.  Respiratory:  Negative for cough and hemoptysis.   Cardiovascular:  Negative for chest pain and palpitations.  Gastrointestinal:  Positive for diarrhea. Negative for heartburn and nausea.  Genitourinary:  Negative for dysuria and urgency.  Musculoskeletal:  Negative for myalgias and neck pain.  Neurological:  Positive for focal weakness and weakness. Negative for dizziness and headaches.   Psychiatric/Behavioral:  The patient is not nervous/anxious and does not have insomnia.      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    Lupus anticoagulant disorder (HCC)    Lupus anticoagulant syndrome (HCC)    Multiple closed anterior-posterior compression fractures of pelvis (HCC)    Seizures (HCC)    last seizure 10 years ago approx    Sleep apnea    cpap broken x 1 year per wife    Stroke (HCC) 02/27/2011   Left side weakness    Past Surgical History:  Procedure Laterality Date   blood clot Right    surgical removal   CHOLECYSTECTOMY     CORONARY ANGIOPLASTY  1992   CYSTOGRAM N/A 02/17/2020   Procedure: CYSTOGRAM;  Surgeon: Andrez Banker, MD;  Location: [facility];  Service: Urology;  Laterality: N/A;   CYSTOSCOPY WITH RETROGRADE PYELOGRAM, URETEROSCOPY AND STENT PLACEMENT Bilateral 02/17/2020   Procedure: Deliliah Fender  URETEROSCOPY AND STENT PLACEMENT;  Surgeon: Andrez Banker, MD;  Location: [facility];  Service: Urology;  Laterality: Bilateral;   SPLENECTOMY, TOTAL     vertebralplasty  History reviewed. No pertinent family history.   Social History:  reports that he quit smoking about 38 years ago. His smoking use included cigarettes. He started smoking about 55 years ago. He has a 16.6 pack-year smoking history. He has never used smokeless tobacco. He reports that he does not currently use drugs. He reports that he does not drink alcohol .   Allergies  Allergen Reactions   Benadryl  [Diphenhydramine ] Other (See Comments)    Affects kidneys   Pepcid  [Famotidine ] Other (See Comments)     Affects kidneys   Beef (Diagnostic) Other (See Comments)    Vegetarian   Chicken Meat (Diagnostic) Other (See Comments)    Vegetarian    Food Other (See Comments)    Animal meat product - pt is vegetarian   Pork (Diagnostic) Other (See Comments)    Vegetarian     Medications Prior to Admission  Medication Sig Dispense Refill   amLODipine  (NORVASC ) 10 MG tablet Take 10 mg by mouth at bedtime.     aspirin  EC 81 MG tablet Take 81 mg by mouth daily. Swallow whole.     baclofen  (LIORESAL ) 10 MG tablet Take 10 mg by mouth in the morning and at bedtime.     buPROPion  (WELLBUTRIN  XL) 150 MG 24 hr tablet Take 150 mg by mouth in the morning.     Cholecalciferol  (VITAMIN D3) 50 MCG (2000 UT) TABS Take 2,000 Units by mouth at bedtime.     fluticasone  (FLONASE ) 50 MCG/ACT nasal spray Place 2 sprays into both nostrils daily as needed for allergies or rhinitis.     gabapentin  (NEURONTIN ) 100 MG capsule Take 1 capsule (100 mg total) by mouth 3 (three) times daily. 90 capsule 0   levETIRAcetam  (KEPPRA ) 500 MG tablet Take 1,500 mg by mouth in the morning and at bedtime.     melatonin 5 MG TABS Take 1 tablet (5 mg total) by mouth at bedtime as needed.     nystatin  (MYCOSTATIN ) 100000 UNIT/ML suspension Take 5 mLs (500,000 Units total) by mouth 4 (four) times daily. 60 mL 0   omeprazole (PRILOSEC) 40 MG capsule Take 40 mg by mouth daily before breakfast.     polyethylene glycol (MIRALAX  / GLYCOLAX ) packet Take 17 g by mouth daily as needed. 14 each 0   rivaroxaban  (XARELTO ) 20 MG TABS tablet Take 1 tablet (20 mg total) by mouth daily with supper. 30 tablet 0   rosuvastatin  (CRESTOR ) 40 MG tablet Take 40 mg by mouth at bedtime.     sertraline  (ZOLOFT ) 100 MG tablet Take 200 mg by mouth in the morning.      THERATEARS 0.25 % SOLN Place 1 drop into both eyes 2 (two) times daily as needed (for redness or irritation).     TYLENOL  500 MG tablet Take 500 mg by mouth See admin instructions. Take 500 mg by mouth at  bedtime and an additional 500 mg once a day as needed for pain or headaches     Home: Home Living Family/patient expects to be discharged to:: Private residence Living Arrangements: Spouse/significant other Available Help at Discharge: Family, Available 24 hours/day Type of Home: House Home Access: Ramped entrance Entrance Stairs-Number of Steps: 5 STE prefers to use steps not ramp Entrance Stairs-Rails: Left, Right, Can reach both Home Layout: One level Bathroom Shower/Tub: Engineer, manufacturing systems: Handicapped height Bathroom Accessibility: Yes Home Equipment: Shower seat, BSC/3in1, Cane - single point, Grab bars - toilet, Hand held shower head, Grab bars - tub/shower, Adaptive equipment,  Wheelchair - manual, Transport chair Additional Comments: pt reports being ambulatory for household distances using hemi walker. Pt has L PFRW, WC, shower chair/bench, bedside commode. Pt reports being independnet with cathing at home prior to admission.  Lives With: Spouse   Functional History: Prior Function Prior Level of Function : Needs assist  Cognitive Assist : Mobility (cognitive), ADLs (cognitive) Mobility (Cognitive): Intermittent cues ADLs (Cognitive): Intermittent cues Physical Assist : Mobility (physical), ADLs (physical) Mobility (physical): Transfers, Gait, Stairs ADLs (physical): Bathing, IADLs Mobility Comments: wife utilizes gait belt as needed, pt amb with hemi walker in the home,,DC from [facility] 07/21/23 ADLs Comments: wife assists with bathing, active wtih HHOT and PT   Functional Status:  Mobility: Bed Mobility Overal bed mobility: Needs Assistance Bed Mobility: Supine to Sit Rolling: Used rails, Contact guard assist Supine to sit: Contact guard, HOB elevated, Used rails Sit to supine: Mod assist General bed mobility comments: up in recliner Transfers Overall transfer level: Needs assistance Equipment used: Hemi-walker Transfers: Sit to/from Stand Sit to Stand:  Min assist Bed to/from chair/wheelchair/BSC transfer type:: Stand pivot Stand pivot transfers: Mod assist General transfer comment: min A to power up fom recliner, min A to control descent to toilet with VCs to use grab bar Ambulation/Gait Ambulation/Gait assistance: Min assist Gait Distance (Feet): 50 Feet Assistive device: Hemi-walker Gait Pattern/deviations: Step-to pattern, Decreased weight shift to left, Decreased dorsiflexion - left, Narrow base of support General Gait Details: step to gait pattern with trunk lean to R while advancing LLE, good hemiwalker placement, verbal cues for intermittent hemiwalker placement and path to safely navigate around obstacles at safe distance, no overt loss of balance; distance limited by pt needing to have BM Gait velocity: decreased   ADL: ADL Overall ADL's : Needs assistance/impaired Eating/Feeding: Set up Eating/Feeding Details (indicate cue type and reason): one handed techniques Grooming: Set up, Sitting, Wash/dry hands, Wash/dry face, Oral care, Brushing hair Grooming Details (indicate cue type and reason): uses L hand 20% of time as gross assist only Upper Body Bathing: Minimal assistance, Sitting Upper Body Bathing Details (indicate cue type and reason): EOB Lower Body Bathing: Maximal assistance, Sitting/lateral leans, Sit to/from stand Upper Body Dressing : Minimal assistance, Sitting (EOB) Lower Body Dressing: Maximal assistance, Sitting/lateral leans, Sit to/from stand (EOB including L AFO) Lower Body Dressing Details (indicate cue type and reason): patient was max A to don L shoe and min A to don R shoe with increased time. patient indicated using long handled shoe horn at home. Toilet Transfer: Moderate assistance, +2 for physical assistance, +2 for safety/equipment, BSC/3in1 Toilet Transfer Details (indicate cue type and reason): use of hemi walker for SPT Toileting- Clothing Manipulation and Hygiene: Maximal assistance,  Sitting/lateral lean, Sit to/from stand Functional mobility during ADLs: Moderate assistance (hemi walker) General ADL Comments: educated for patient to have wife don L AFO bed level as pt reports wife has back issues   Cognition: Cognition Orientation Level: Oriented X4 Cognition Arousal: Alert Behavior During Therapy: WFL for tasks assessed/performed      Blood pressure 121/68, pulse 74, temperature 98.7 F (37.1 C), resp. rate 18, height 5\' 10"  (1.778 m), weight 87 kg, SpO2 97%. Gen: no distress, normal appearing HEENT: oral mucosa pink and moist, NCAT Cardio: Reg rate Chest: normal effort, normal rate of breathing Abd: soft, non-distended Ext: no edema Psych: pleasant, normal affect Skin: intact Neuro: AOx4 Musculoskeletal: LLE 4/5 with 2/5 DF, LUE 0/5 proximally and trace distally, decreased sensation throughout left side  Results  for orders placed or performed during the hospital encounter of 07/28/23 (from the past 48 hours)  CBC     Status: Abnormal   Collection Time: 08/05/23  6:05 AM  Result Value Ref Range   WBC 14.1 (H) 4.0 - 10.5 K/uL    Comment: WHITE COUNT CONFIRMED ON SMEAR   RBC 3.47 (L) 4.22 - 5.81 MIL/uL   Hemoglobin 10.3 (L) 13.0 - 17.0 g/dL   HCT 78.2 (L) 95.6 - 21.3 %   MCV 97.4 80.0 - 100.0 fL   MCH 29.7 26.0 - 34.0 pg   MCHC 30.5 30.0 - 36.0 g/dL   RDW 08.6 (H) 57.8 - 46.9 %   Platelets 332 150 - 400 K/uL   nRBC 3.1 (H) 0.0 - 0.2 %    Comment: Performed at [facility], [address] [place], [place] [address]  Comprehensive metabolic panel     Status: Abnormal   Collection Time: 08/05/23  6:05 AM  Result Value Ref Range   Sodium 134 (L) 135 - 145 mmol/L   Potassium 3.7 3.5 - 5.1 mmol/L   Chloride 105 98 - 111 mmol/L   CO2 22 22 - 32 mmol/L   Glucose, Bld 106 (H) 70 - 99 mg/dL    Comment: Glucose reference range applies only to samples taken after fasting for at least 8 hours.   BUN 31 (H) 8 - 23 mg/dL   Creatinine, Ser  8.41 (H) 0.61 - 1.24 mg/dL   Calcium  8.3 (L) 8.9 - 10.3 mg/dL   Total Protein 7.0 6.5 - 8.1 g/dL   Albumin 2.6 (L) 3.5 - 5.0 g/dL   AST 26 15 - 41 U/L   ALT 24 0 - 44 U/L   Alkaline Phosphatase 48 38 - 126 U/L   Total Bilirubin 0.4 0.0 - 1.2 mg/dL   GFR, Estimated 54 (L) >60 mL/min    Comment: (NOTE) Calculated using the CKD-EPI Creatinine Equation (2021)    Anion gap 7 5 - 15    Comment: Performed at [facility], [address] [place], [place] [address]  Magnesium      Status: None   Collection Time: 08/05/23  6:05 AM  Result Value Ref Range   Magnesium  2.0 1.7 - 2.4 mg/dL    Comment: Performed at [facility], [address] [place], [place] [address]  CBC     Status: Abnormal   Collection Time: 08/06/23  6:07 AM  Result Value Ref Range   WBC 11.8 (H) 4.0 - 10.5 K/uL    Comment: WHITE COUNT CONFIRMED ON SMEAR   RBC 3.38 (L) 4.22 - 5.81 MIL/uL   Hemoglobin 9.9 (L) 13.0 - 17.0 g/dL   HCT 53.6 (L) 64.4 - 03.4 %   MCV 97.3 80.0 - 100.0 fL   MCH 29.3 26.0 - 34.0 pg   MCHC 30.1 30.0 - 36.0 g/dL   RDW 74.2 (H) 59.5 - 63.8 %   Platelets 375 150 - 400 K/uL   nRBC 3.0 (H) 0.0 - 0.2 %    Comment: Performed at [facility], [address] [place], [place] [address]  Basic metabolic panel with GFR     Status: Abnormal   Collection Time: 08/06/23  6:07 AM  Result Value Ref Range   Sodium 137 135 - 145 mmol/L   Potassium 3.6 3.5 - 5.1 mmol/L   Chloride 108 98 - 111 mmol/L   CO2 21 (L) 22 - 32 mmol/L  Glucose, Bld 100 (H) 70 - 99 mg/dL    Comment: Glucose reference range applies only to samples taken after fasting for at least 8 hours.   BUN 28 (H) 8 - 23 mg/dL   Creatinine, Ser 6.04 (H) 0.61 - 1.24 mg/dL   Calcium  8.6 (L) 8.9 - 10.3 mg/dL   GFR, Estimated 58 (L) >60 mL/min    Comment: (NOTE) Calculated using the CKD-EPI Creatinine Equation (2021)    Anion gap 8 5 - 15    Comment: Performed at [facility], [address], [place], [place] [address]  Magnesium      Status: None   Collection Time: 08/06/23  6:07 AM  Result Value Ref Range   Magnesium  1.9 1.7 - 2.4 mg/dL    Comment: Performed at [facility], [address], [place], [place] [address]   *Note: Due to a large number of results and/or encounters for the requested time period, some results have not been displayed. A complete set of results can be found in Results Review.   No results found.    Blood pressure 121/68, pulse 74, temperature 98.7 F (37.1 C), resp. rate 18, height 5\' 10"  (1.778 m), weight 87 kg, SpO2 97%.  Medical Problem List and Plan: 1. Functional deficits secondary to debility 2/2 urosepsis and AKI  -patient may shower  -ELOS/Goals: 7 days S  Admit to CIR  2.  Lupus anticoagulant/Antithrombotics: -DVT/anticoagulation:  Pharmaceutical: Xarelto   -antiplatelet therapy: ASA resumed.  3. Pain Management: tylenol  prn  4. Mood/Behavior/Sleep: LCSW to evaluate and provide support prn.   -antipsychotic agents: N/A  --melatonin prn for insomnia.   5. Neuropsych/cognition: This patient is capable of making decisions on his own behalf.  6. Skin/Wound Care: routine pressure relief measures.   7. Fluids/Electrolytes/Nutrition: Monitor I/O. Check CMET in am.   8. Urosepsis/E coli UTI: Treated with antibiotics thru 04/20  9. Acute renal failure: resolving. Continue to monitor  10. HTN: Monitor BP TID  11. Chronic intermittent diarrhea: continue probiotics. Add fiber  12. Neurogenic bladder: Discontinue foley --Resume I/O caths 4-6 hours to keep bladder volumes < 350 cc  13. Leucocytosis: Resolving from 23.9--> 11.8.   14. ITP: Has improved and plans for Rituxan  on outpatient basis  --monitor for recurrent nose bleeds.   15. Severe Neuropathy BLE w/gait d/o: scheduled for outpatient Qutenza  16. H/o stroke with Left spastic HP: Continue low dose ASA, Xarelto  and baclofen .    17. Seizure d/o: Stable on Keppra  1500 mg BID  18. H/o depression/anxiety: continue Sertraline  and Bupropion .    I have personally performed a face to face diagnostic evaluation, including, but not limited to relevant history and physical exam findings, of this patient and developed relevant assessment and plan.  Additionally, I have reviewed and concur with the physician assistant's documentation above.  Zelda Hickman, PA-C   Liam Redhead, MD 08/06/2023

## 2023-08-06 NOTE — Discharge Summary (Signed)
 Physician Discharge Summary   Patient: Mark [name] MRN: [id number] DOB: [birth year]  Admit date:     07/28/2023  Discharge date: 08/06/23  Discharge Physician: Aneita Keens, MD   PCP: Victorio Grave, MD   Recommendations at discharge:  PCP visit for hospital follow-up Acute inpatient rehabilitation on discharge  Discharge Diagnoses: Principal Problem:   Sepsis [facility]) Active Problems:   Idiopathic thrombocytopenic purpura (HCC)   Deep vein thrombosis (HCC)   AKI (acute kidney injury) (HCC)   Lower urinary tract infectious disease   Dyslipidemia   Depression   GERD (gastroesophageal reflux disease)   Seizure (HCC)   Hypotension  Resolved Problems:   * No resolved hospital problems. *  Hospital Course: Mark [name] is a 78 y.o. male with a history of DVT, seizure, stroke with residual left hemiparesis, neurogenic bladder.  Patient presented secondary to fevers and rigors with associated weakness and foul-smelling urine, found to have sepsis secondary to UTI. Cultures obtained and empiric antibiotics started. Ceftriaxone  transitioned to Cefadroxil  once culture data was available. Hospitalization complicated by mild thrombocytopenia, treated as acute ITP. Patient discharged to acute inpatient rehabilitation.  Assessment and Plan:  Sepsis Present on admission, secondary to UTI. Urine and blood cultures obtained. Antibiotics for UTI started. Blood cultures with no growth and urine culture significant for E. Coli.  UTI In setting of intermittent catheterization. Patient started empirically on Ceftriaxone . Urine culture significant for E. Coli. Ceftriaxone  transitioned to Cefadroxil  to complete a 7-day course of antibiotics.  AKI on CKD stage IIIa Baseline creatinine of about 1.2-1.3. Creatinine peak of 1.68 with quick resolution after IV fluids.  Possible ITP Medical oncology consulted. Patient received IVIG x2, Nplate  and IV steroids. Platelets as low as 70,000 before  returning to normal levels. Aspirin  was held until discharge.  Hypokalemia Treated with potassium supplementation.  Anemia of chronic disease Secondary to chronic illness. Stable.  History of DVT Continue Xarelto .  History of stroke Residual left-sided hemiparesis. Continue Aspirin  on discharge.  History of seizures Continue Keppra .  Hyperlipidemia Continue Crestor .  Depression Continue Zoloft  and Wellbutrin .  GERD Continue omeprazole.   Consultants: Medical oncology, PM&R Procedures performed: None  Disposition: Rehabilitation facility Diet recommendation: Regular diet   DISCHARGE MEDICATION: Allergies as of 08/06/2023       Reactions   Diphenhydramine  Other (See Comments)   Affects kidneys   Famotidine  Other (See Comments)   Affects kidneys   Beef-derived Drug Products Other (See Comments)   Vegetarian   Chicken Protein Other (See Comments)   Vegetarian   Pork-derived Products Other (See Comments)   Vegetarian        Medication List     TAKE these medications    amLODipine  10 MG tablet Commonly known as: NORVASC  Take 10 mg by mouth at bedtime.   aspirin  EC 81 MG tablet Take 81 mg by mouth daily. Swallow whole.   baclofen  10 MG tablet Commonly known as: LIORESAL  Take 10 mg by mouth in the morning and at bedtime.   buPROPion  150 MG 24 hr tablet Commonly known as: WELLBUTRIN  XL Take 150 mg by mouth in the morning.   fluticasone  50 MCG/ACT nasal spray Commonly known as: FLONASE  Place 2 sprays into both nostrils daily as needed for allergies or rhinitis.   gabapentin  100 MG capsule Commonly known as: NEURONTIN  Take 1 capsule (100 mg total) by mouth 3 (three) times daily.   levETIRAcetam  500 MG tablet Commonly known as: KEPPRA  Take 1,500 mg by mouth in the morning and  at bedtime.   melatonin 5 MG Tabs Take 1 tablet (5 mg total) by mouth at bedtime as needed.   nystatin  100000 UNIT/ML suspension Commonly known as: MYCOSTATIN  Take 5 mLs  (500,000 Units total) by mouth 4 (four) times daily.   omeprazole 40 MG capsule Commonly known as: PRILOSEC Take 40 mg by mouth daily before breakfast.   polyethylene glycol 17 g packet Commonly known as: MIRALAX  / GLYCOLAX  Take 17 g by mouth daily as needed.   rosuvastatin  40 MG tablet Commonly known as: CRESTOR  Take 40 mg by mouth at bedtime.   sertraline  100 MG tablet Commonly known as: ZOLOFT  Take 200 mg by mouth in the morning.   Theratears 0.25 % Soln Generic drug: Carboxymethylcellulose Sodium Place 1 drop into both eyes 2 (two) times daily as needed (for redness or irritation).   TYLENOL  500 MG tablet Generic drug: acetaminophen  Take 500 mg by mouth See admin instructions. Take 500 mg by mouth at bedtime and an additional 500 mg once a day as needed for pain or headaches   Vitamin D3 50 MCG (2000 UT) Tabs Take 2,000 Units by mouth at bedtime.   Xarelto  20 MG Tabs tablet Generic drug: rivaroxaban  Take 1 tablet (20 mg total) by mouth daily with supper.        Follow-up Information     Victorio Grave, MD. Schedule an appointment as soon as possible for a visit .   Specialty: Family Medicine Contact information: [phone] [place] [address] [phone]                Discharge Exam: BP 122/67 (BP Location: Left Arm)   Pulse 65   Temp 98.5 F (36.9 C) (Oral)   Resp 18   Ht 5\' 11"  (1.803 m)   Wt 90.6 kg   SpO2 95%   BMI 27.86 kg/m   General exam: Appears calm and comfortable Respiratory system: Clear to auscultation. Respiratory effort normal. Cardiovascular system: S1 & S2 heard, RRR. Gastrointestinal system: Abdomen is nondistended, soft and nontender. Normal bowel sounds heard. Central nervous system: Alert and oriented. Psychiatry: Judgement and insight appear normal. Mood & affect appropriate.   Condition at discharge: stable  The results of significant diagnostics from this hospitalization (including imaging,  microbiology, ancillary and laboratory) are listed below for reference.   Imaging Studies: DG Abd 2 Views Result Date: 08/02/2023 CLINICAL DATA:  Thrombocytopenia. Complains of loose stools and abdominal tightness. EXAM: ABDOMEN - 2 VIEW COMPARISON:  None FINDINGS: Mild gaseous distension of the stomach. No dilated loops of small bowel. There is a moderate stool burden noted within the colon. No signs pneumoperitoneum. Severe degenerative changes involving the right hip. Surgical clips noted within the left upper quadrant and right upper quadrant of the abdomen. IMPRESSION: 1. No signs of bowel obstruction. 2. Moderate stool burden noted within the colon. Electronically Signed   By: Kimberley Penman M.D.   On: 08/02/2023 13:21   DG Chest Port 1 View Result Date: 07/28/2023 CLINICAL DATA:  Left-sided paralysis, fever. Pyuria. Possible sepsis. EXAM: PORTABLE CHEST 1 VIEW COMPARISON:  07/15/2023 FINDINGS: The patient is rotated to the right on today's radiograph, reducing diagnostic sensitivity and specificity. Low lung volumes are present, causing crowding of the pulmonary vasculature. Indistinct pulmonary vasculature, cannot exclude pulmonary venous hypertension although no overt cardiomegaly is observed. Chronic right lower rib deformities likely from old healed fractures. Vertebral augmentations at T12 and L1. No overt consolidation/airspace opacity identified. No blunting of the  costophrenic angles. IMPRESSION: 1. Low lung volumes are present, causing crowding of the pulmonary vasculature. 2. Indistinct pulmonary vasculature, cannot exclude pulmonary venous hypertension although no overt cardiomegaly is observed. 3. Chronic right lower rib deformities likely from old healed fractures. 4. Vertebral augmentations at T12 and L1. Electronically Signed   By: Freida Jes M.D.   On: 07/28/2023 14:22   DG Chest 2 View Result Date: 07/15/2023 CLINICAL DATA:  Shortness of breath EXAM: CHEST - 2 VIEW  COMPARISON:  06/29/2020 FINDINGS: The heart size and mediastinal contours are within normal limits. Both lungs are clear. The visualized skeletal structures are unremarkable. IMPRESSION: No active cardiopulmonary disease. Electronically Signed   By: Janeece Mechanic M.D.   On: 07/15/2023 20:01    Microbiology: Results for orders placed or performed during the hospital encounter of 07/28/23  Blood Culture (routine x 2)     Status: None   Collection Time: 07/28/23 11:52 AM   Specimen: BLOOD RIGHT FOREARM  Result Value Ref Range Status   Specimen Description   Final    BLOOD RIGHT FOREARM Performed at [facility], [address], [place], [place] [address]    Special Requests   Final    BOTTLES DRAWN AEROBIC AND ANAEROBIC Blood Culture results may not be optimal due to an inadequate volume of blood received in culture bottles Performed at [facility], [address] [place], [place] [address]    Culture   Final    NO GROWTH 5 DAYS Performed at [facility], [address], [place], [place] [address]    Report Status 08/02/2023 FINAL  Final  Blood Culture (routine x 2)     Status: None   Collection Time: 07/28/23 12:19 PM   Specimen: BLOOD RIGHT HAND  Result Value Ref Range Status   Specimen Description   Final    BLOOD RIGHT HAND Performed at [facility], [address] [place], [place] [address]    Special Requests   Final    BOTTLES DRAWN AEROBIC AND ANAEROBIC Blood Culture adequate volume Performed at [facility], [address], [place], [place] [address]    Culture   Final    NO GROWTH 5 DAYS Performed at [facility], [address], [place], [place] [address]    Report Status 08/02/2023 FINAL  Final  Urine Culture     Status: Abnormal   Collection Time: 07/28/23  1:35 PM   Specimen: Urine, Random  Result Value Ref Range Status   Specimen Description   Final    URINE,  RANDOM Performed at [facility], [address], [place], [place] [address]    Special Requests   Final    NONE Reflexed from [id number] Performed at [facility], [address] [place], [place] [address]    Culture >=100,000 COLONIES/mL ESCHERICHIA COLI (A)  Final   Report Status 07/30/2023 FINAL  Final   Organism ID, Bacteria ESCHERICHIA COLI (A)  Final      Susceptibility   Escherichia coli - MIC*    AMPICILLIN 4 SENSITIVE Sensitive     CEFAZOLIN <=4 SENSITIVE Sensitive     CEFEPIME <=0.12 SENSITIVE Sensitive     CEFTRIAXONE  <=0.25 SENSITIVE Sensitive     CIPROFLOXACIN  <=0.25 SENSITIVE Sensitive     GENTAMICIN  <=1 SENSITIVE Sensitive     IMIPENEM <=0.25 SENSITIVE Sensitive     NITROFURANTOIN <=16 SENSITIVE Sensitive     TRIMETH/SULFA <=20  SENSITIVE Sensitive     AMPICILLIN/SULBACTAM <=2 SENSITIVE Sensitive     PIP/TAZO <=4 SENSITIVE Sensitive ug/mL    * >=100,000 COLONIES/mL ESCHERICHIA COLI   *Note: Due to a large number of results and/or encounters for the requested time period, some results have not been displayed. A complete set of results can be found in Results Review.    Labs: CBC: Recent Labs  Lab 08/01/23 0539 08/03/23 0704 08/04/23 0544 08/05/23 0605 08/06/23 0607  WBC 11.6* 13.0* 14.5* 14.1* 11.8*  HGB 9.9* 10.7* 10.4* 10.3* 9.9*  HCT 32.7* 34.0* 33.6* 33.8* 32.9*  MCV 97.0 95.0 96.0 97.4 97.3  PLT 95* 196 255 332 375   Basic Metabolic Panel: Recent Labs  Lab 08/02/23 0749 08/03/23 0704 08/04/23 0544 08/05/23 0605 08/06/23 0607  NA 138 137 138 134* 137  K 3.4* 3.8 3.7 3.7 3.6  CL 107 108 110 105 108  CO2 23 23 22 22  21*  GLUCOSE 130* 98 107* 106* 100*  BUN 30* 31* 37* 31* 28*  CREATININE 1.27* 1.15 1.20 1.34* 1.27*  CALCIUM  8.6* 8.3* 8.4* 8.3* 8.6*  MG 2.0 2.1 1.9 2.0 1.9   Liver Function Tests: Recent Labs  Lab 08/03/23 0704 08/04/23 0544 08/05/23 0605  AST 24 24 26   ALT 25 25 24   ALKPHOS 48 46 48   BILITOT 0.4 0.5 0.4  PROT 7.8 6.9 7.0  ALBUMIN 2.8* 2.8* 2.6*    Discharge time spent: 35 minutes.  Signed: Aneita Keens, MD [facility] 08/06/2023

## 2023-08-06 NOTE — TOC Transition Note (Signed)
 Transition of Care [facility]) - Discharge Note   Patient Details  Name: Mark [name] MRN: [id number] Date of Birth: September 13, 1945  Transition of Care [facility]) CM/SW Contact:  Loreda Rodriguez, RN Phone Number:[phone]  08/06/2023, 10:51 AM   Clinical Narrative:    Patient is being discharged to impatient rehab at [facility] Transportation will be set up by nursing for [facility]. Cm at bedside to update patient. No TOC needs noted.    Final next level of care: IP Rehab Facility Barriers to Discharge: No Barriers Identified   Patient Goals and CMS Choice Patient states their goals for this hospitalization and ongoing recovery are:: Ready to go to inpatient rehab     [place] ownership interest in [facility].provided to:: Patient    Discharge Placement                       Discharge Plan and Services Additional resources added to the After Visit Summary for                  DME Arranged: N/A DME Agency: NA       HH Arranged: NA HH Agency: NA        Social Drivers of Health (SDOH) Interventions SDOH Screenings   Food Insecurity: No Food Insecurity (07/28/2023)  Housing: Low Risk  (07/28/2023)  Transportation Needs: No Transportation Needs (07/28/2023)  Utilities: Not At Risk (07/28/2023)  Social Connections: Socially Integrated (07/28/2023)  Tobacco Use: Medium Risk (07/28/2023)     Readmission Risk Interventions    08/02/2023    3:29 PM 07/09/2023   10:34 AM 07/06/2023    1:53 PM  Readmission Risk Prevention Plan  Post Dischage Appt  Complete   Medication Screening  Complete   Transportation Screening Complete Complete Complete  PCP or Specialist Appt within 5-7 Days   Complete  PCP or Specialist Appt within 3-5 Days Complete    Home Care Screening   Complete  Medication Review (RN CM)   Complete  HRI or Home Care Consult Complete    Social Work Consult for Recovery Care Planning/Counseling Complete    Palliative Care Screening Not  Applicable    Medication Review [employer]) Complete

## 2023-08-06 NOTE — Progress Notes (Signed)
 Rawland Caddy, MD  Physician Physical Medicine and Rehabilitation   Progress Notes    Signed   Date of Service: 08/05/2023 12:15 PM  Related encounter: ED to Hosp-Admission (Current) from 07/28/2023 in [place] [facility]   Signed      Physical Medicine & Rehabilitation Consult Service   Pt discussed with rehab admissions coordinator. Chart has been reviewed. This is a patient familiar to the rehab program who was with us  from 07/09/23-07/22/23 for debility after severe thrombocytopenia. He was discharged home at a supervision contact guard assist level. He was readmitted on 4/14 with urosepsis and AKI. Course complicated by thrombocytopenia/ITP and pt received IVIG and steroids.  Also with AKI, leukocytosis. With therapy he's requiring mod assist for sit-std transfers and min assist for gait 15-45'.  He has been min to max assist for ADL's.      Home: Home Living Family/patient expects to be discharged to:: Private residence Living Arrangements: Spouse/significant other Available Help at Discharge: Family, Available 24 hours/day Type of Home: House Home Access: Ramped entrance Entrance Stairs-Number of Steps: 5 STE prefers to use steps not ramp Entrance Stairs-Rails: Left, Right, Can reach both Home Layout: One level Bathroom Shower/Tub: Engineer, manufacturing systems: Handicapped height Bathroom Accessibility: Yes Home Equipment: Shower seat, BSC/3in1, Cane - single point, Grab bars - toilet, Hand held shower head, Grab bars - tub/shower, Adaptive equipment, Wheelchair - manual, Transport chair Additional Comments: pt reports being ambulatory for household distances using hemi walker. Pt has L PFRW, WC, shower chair/bench, bedside commode. Pt reports being independnet with cathing at home prior to admission.  Lives With: Spouse  Functional History: Prior Function Prior Level of Function : Needs assist  Cognitive Assist : Mobility (cognitive), ADLs  (cognitive) Mobility (Cognitive): Intermittent cues ADLs (Cognitive): Intermittent cues Physical Assist : Mobility (physical), ADLs (physical) Mobility (physical): Transfers, Gait, Stairs ADLs (physical): Bathing, IADLs Mobility Comments: wife utilizes gait belt as needed, pt amb with hemi walker in the home,,[facility] 07/21/23 ADLs Comments: wife assists with bathing, active wtih HHOT and PT Functional Status:  Mobility: Bed Mobility Overal bed mobility: Needs Assistance Bed Mobility: Supine to Sit Rolling: Used rails, Contact guard assist Supine to sit: Contact guard, HOB elevated, Used rails Sit to supine: Mod assist General bed mobility comments: increased time and effort, pt hooking LLE with RLE to assist to EOB, strong use of bedrail with RUE to assist in uprighting trunk Transfers Overall transfer level: Needs assistance Equipment used: Hemi-walker Transfers: Sit to/from Stand Sit to Stand: Min assist, From elevated surface Bed to/from chair/wheelchair/BSC transfer type:: Stand pivot Stand pivot transfers: Mod assist General transfer comment: min A to power up from elevated bed and steady, verbal cues for widening BOS and pt needing increased time to reposition; min A to power up from toilet with use of grab bar in bathroom, again cues for BOS and hemiwalker positioning to improve safety Ambulation/Gait Ambulation/Gait assistance: Min assist Gait Distance (Feet):  (15', 40', 35') Assistive device: Hemi-walker Gait Pattern/deviations: Step-to pattern, Decreased weight shift to left, Decreased dorsiflexion - left, Narrow base of support General Gait Details: step to gait pattern with trunk lean to R while advancing LLE, good hemiwalker placement, verbal cues for intermittent hemiwalker placement and path to safely navigate around obstacles at safe distance, recliner follow for seated rest breaks due to fatigue Gait velocity: decreased   ADL: ADL Overall ADL's : Needs  assistance/impaired Eating/Feeding: Set up Eating/Feeding Details (indicate cue  type and reason): one handed techniques Grooming: Set up, Sitting, Wash/dry hands, Wash/dry face, Oral care, Brushing hair Grooming Details (indicate cue type and reason): uses L hand 20% of time as gross assist only Upper Body Bathing: Minimal assistance, Sitting Upper Body Bathing Details (indicate cue type and reason): EOB Lower Body Bathing: Maximal assistance, Sitting/lateral leans, Sit to/from stand Upper Body Dressing : Minimal assistance, Sitting (EOB) Lower Body Dressing: Maximal assistance, Sitting/lateral leans, Sit to/from stand (EOB including L AFO) Lower Body Dressing Details (indicate cue type and reason): patient was max A to don L shoe and min A to don R shoe with increased time. patient indicated using long handled shoe horn at home. Toilet Transfer: Moderate assistance, +2 for physical assistance, +2 for safety/equipment, BSC/3in1 Toilet Transfer Details (indicate cue type and reason): use of hemi walker for SPT Toileting- Clothing Manipulation and Hygiene: Maximal assistance, Sitting/lateral lean, Sit to/from stand Functional mobility during ADLs: Moderate assistance (hemi walker) General ADL Comments: educated for patient to have wife don L AFO bed level as pt reports wife has back issues   Cognition: Cognition Orientation Level: Oriented X4 Cognition Arousal: Alert Behavior During Therapy: WFL for tasks assessed/performed     Assessment: 78 yo male with debility after urosepsis.  Thrombocytopenia, ?recurrent ITP AKI on CKD IIIA leukocytosis     Plan:   This patient would benefit from acute inpatient rehab to address functional mobility, activity tolerance, self-care/ADL's. Additionally, the patient requires daily MD oversight of the active medical issues noted above. Projected goals would be supervision to contact guard assist  with an ELOS of 7 days.  Dispo and social supports are  appropriate.      Rehab Admissions Coordinator to follow up.     Rawland Caddy, MD, [facility] Physical Medicine & Rehabilitation Medical Director Rehabilitation Services 08/05/2023

## 2023-08-06 NOTE — Progress Notes (Signed)
 Liam Redhead, MD  Physician Physical Medicine and Rehabilitation   PMR Pre-admission    Signed   Date of Service: 08/06/2023 10:01 AM  Related encounter: ED to Hosp-Admission (Current) from 07/28/2023 in [facility] 6 EAST ONCOLOGY   Signed     Expand All Collapse All  PMR Admission Coordinator Pre-Admission Assessment   Patient: Mark [name] is an 78 y.o., male MRN: [id number] DOB: [birth year] Height: 5\' 11"  (180.3 cm) Weight: 90.6 kg                                                                                                                                                  Insurance Information HMO:     PPO: yes     PCP:      IPA:      80/20:      OTHER:  PRIMARY: [employer] Medicare HMO/PPO      Policy#: [id number]      Subscriber: pt CM Name: Mark [name]      Phone#: [phone]     Fax#: [phone] Pre-Cert#: [id number] auth for CIR from T[place] with [employer] with updates due to fax listed above on 4/29      Employer:  Benefits:  Phone #: [phone]     Name:  Eff. Date: 04/16/23     Deduct: $0      Out of Pocket Max: $4150 (met [id number])      Life Max: n/a  CIR: $30/day for days 1-6      SNF: $10/day for days 1-20, $214/day for days 21-100 Outpatient:      Co-Pay: $10/visit Home Health: 100%      Co-Pay:  DME: 80%     Co-Pay: 20% Providers:  SECONDARY:       Policy#:       Phone#:    Artist:       Phone#:    The Engineer, materials Information Summary" for patients in Inpatient Rehabilitation Facilities with attached "Privacy Act Statement-Health Care Records" was provided and verbally reviewed with: Patient and Family   Emergency Contact Information Contact Information       Name Relation Home Work Mobile    Shackelford-Barefoot,Mark [name] [phone]   [phone]         Other Contacts       Name Relation Home Work Mobile    Mark [name],Mark [name] Daughter [phone]             Current Medical History  Patient Admitting  Diagnosis: debility, ITP   History of Present Illness: Pt is a 78 y/o male with PMH of ITP, prior CVA with residual L hemiparesis, DVT on xarelto , lupus, CAD, HTN, GERD, seizure, and CKD IIIa, and neurogenic bladder (self caths) who was recently admitted to [facility] CIR from 3/26 through 4/8 for similar presentation.  He presented to [facility] on  4/14 with c/o chills, fever, and foul smelling urine.  He did have a fall early that morning. In ED BP 87/63, WBC 23.9, platelets 133,000, and creatinine 1.68.  and he was admitted for sepsis from urologic origin.  He was started on IV ceftriaxone .  Oncology consulted given history of ITP and possible recurrence.  He received nplate , decadron , and IVIG to treat ITP recurrence.  Oncology recommendations to start Revlimid to maintain remission.  Therapy evaluations completed and pt has experienced a significant decline in function since d/c from CIR on 4/8 (currently max assist for ADLs, and min assist for mobility compared to supervision overall at d/c).  Recommendations are for pt to return to CIR.  Glasgow Coma Scale Score: 15   Patient's medical record from [facility] has been reviewed by the rehabilitation admission coordinator and physician.   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 No   Family History  family history is not on file.     Current Medications   Current Medications    Current Facility-Administered Medications:    acetaminophen  (TYLENOL ) tablet 650 mg, 650 mg, Oral, Q6H PRN **[name]** acetaminophen  (TYLENOL ) suppository 650 mg, 650 mg, Rectal, Q6H PRN, Bennie Brave, MD   baclofen  (LIORESAL ) tablet 10 mg, 10 mg, Oral, BID, Goel, Hersh, MD, 10 mg at 08/05/23 1000   buPROPion  (WELLBUTRIN  XL) 24 hr tablet 150 mg, 150 mg, Oral, Daily, Goel, Hersh, MD, 150 mg at 08/05/23 1000   Chlorhexidine  Gluconate Cloth 2 % PADS 6 each, 6 each, Topical, Daily, Montey Apa, DO, 6 each at 08/04/23 1617   gabapentin  (NEURONTIN ) capsule 100 mg, 100 mg, Oral, TID, Goel, Hersh, MD, 100 mg at 08/05/23 1000   guaiFENesin -dextromethorphan (ROBITUSSIN DM) 100-10 MG/5ML syrup 5 mL, 5 mL, Oral, Q4H PRN, Maury Space, Kshitiz, MD   levETIRAcetam  (KEPPRA ) tablet 1,500 mg, 1,500 mg, Oral, BID, Goel, Hersh, MD, 1,500 mg at 08/05/23 0959   melatonin tablet 5 mg, 5 mg, Oral, QHS, Goel, Hersh, MD, 5 mg at 08/04/23 2147   pantoprazole  (PROTONIX ) EC tablet 40 mg, 40 mg, Oral, Daily, Goel, Hersh, MD, 40 mg at 08/05/23 1000   polyethylene glycol (MIRALAX  / GLYCOLAX ) packet 17 g, 17 g, Oral, Daily PRN, Goel, Hersh, MD   polyvinyl alcohol  (LIQUIFILM TEARS) 1.4 % ophthalmic solution 1 drop, 1 drop, Both Eyes, PRN, Goel, Hersh, MD   rivaroxaban  (XARELTO ) tablet  20 mg, 20 mg, Oral, Q supper, Ennever, Peter R, MD, 20 mg at 08/04/23 1616   rosuvastatin  (CRESTOR ) tablet 40 mg, 40 mg, Oral, QHS, Goel, Hersh, MD, 40 mg at 08/04/23 2147   saccharomyces boulardii (FLORASTOR) capsule 250 mg, 250 mg, Oral, BID, Ennever, Peter R, MD, 250 mg at 08/05/23 1000   sertraline  (ZOLOFT ) tablet 100 mg, 100 mg, Oral, Daily, Goel, Hersh, MD, 100 mg at 08/05/23 1000   sodium chloride  flush (NS) 0.9 % injection 3 mL, 3 mL,  Intravenous, Q12H, Bennie Brave, MD, 3 mL at 08/04/23 2148     Patients Current Diet:  Diet Order                  Diet vegetarian Room service appropriate? Yes; Fluid consistency: Thin  Diet effective now                         Precautions / Restrictions Precautions Precautions: Fall Precaution/Restrictions Comments: L hemiplegia Other Brace: L AFO Restrictions Weight Bearing Restrictions Per Provider Order: No    Has the patient had 2 [name] more falls [name] a fall with injury in the past year?Yes   Prior Activity Level Limited Community (1-2x/wk): recent d/c from CIR at supervision level with HW for adls and mobility up to 100'. not driving   Prior Functional Level Prior Function Prior Level of Function : Needs assist  Cognitive Assist : Mobility (cognitive), ADLs (cognitive) Mobility (Cognitive): Intermittent cues ADLs (Cognitive): Intermittent cues Physical Assist : Mobility (physical), ADLs (physical) Mobility (physical): Transfers, Gait, Stairs ADLs (physical): Bathing, IADLs Mobility Comments: wife utilizes gait belt as needed, pt amb with hemi walker in the home,,DC from [facility] 07/21/23 ADLs Comments: wife assists with bathing, active wtih HHOT and PT   Self Care: Did the patient need help bathing, dressing, using the toilet [name] eating?  Needed some help   Indoor Mobility: Did the patient need assistance with walking from room to room (with [name] without device)? Needed some help   Stairs: Did the patient need assistance with internal [name] external stairs (with [name] without device)? Needed some help   Functional Cognition: Did the patient need help planning regular tasks such as shopping [name] remembering to take medications? Needed some help   Patient Information Are you of Hispanic, Latino/a,[name] Spanish origin?: A. No, not of Hispanic, Latino/a, [name] Spanish origin What is your race?: A. White Do you need [name] want an interpreter to communicate with a doctor [name] health care  staff?: 0. No   Patient's Response To:  Health Literacy and Transportation Is the patient able to respond to health literacy and transportation needs?: Yes Health Literacy - How often do you need to have someone help you when you read instructions, pamphlets, [name] other written material from your doctor [name] pharmacy?: Never In the past 12 months, has lack of transportation kept you from medical appointments [name] from getting medications?: No In the past 12 months, has lack of transportation kept you from meetings, work, [name] from getting things needed for daily living?: No   Journalist, newspaper / Equipment Home Equipment: Shower seat, BSC/3in1, Medical laboratory scientific officer - single point, Grab bars - toilet, Hand held shower head, Grab bars - tub/shower, Adaptive equipment, Wheelchair - manual, Transport chair   Prior Device Use: Indicate devices/aids used by the patient prior to current illness, exacerbation [name] injury? Manual wheelchair and hemiwalker   Current Functional Level Cognition   Orientation Level: Oriented X4  Extremity Assessment (includes Sensation/Coordination)   Upper Extremity Assessment: LUE deficits/detail LUE Deficits / Details: pt with hx CVA.  L UE non functional, increased tone. Pt reports he does perform ROM as well as the HHOT helps. patient noted to have one finger breath subluxation of this shoulder with education provided on protection of UE. patient reported having a splint at home but unclear if it is still there at this time. LUE:  (phone call x 2 to wife to ensure he does have his L resting hand splint [name] palm guard with messages left) LUE Coordination: decreased fine motor, decreased gross motor  Lower Extremity Assessment: LLE deficits/detail, Defer to PT evaluation LLE Deficits / Details: appled L AFO for stability for transfers this session LLE Coordination: decreased fine motor, decreased gross motor     ADLs   Overall ADL's : Needs assistance/impaired Eating/Feeding: Set  up Eating/Feeding Details (indicate cue type and reason): one handed techniques Grooming: Set up, Sitting, Wash/dry hands, Wash/dry face, Oral care, Brushing hair Grooming Details (indicate cue type and reason): uses L hand 20% of time as gross assist only Upper Body Bathing: Minimal assistance, Sitting Upper Body Bathing Details (indicate cue type and reason): EOB Lower Body Bathing: Maximal assistance, Sitting/lateral leans, Sit to/from stand Upper Body Dressing : Minimal assistance, Sitting (EOB) Lower Body Dressing: Maximal assistance, Sitting/lateral leans, Sit to/from stand (EOB including L AFO) Toilet Transfer: Moderate assistance, +2 for physical assistance, +2 for safety/equipment, BSC/3in1 Toilet Transfer Details (indicate cue type and reason): use of hemi walker for SPT Toileting- Clothing Manipulation and Hygiene: Maximal assistance, Sitting/lateral lean, Sit to/from stand Functional mobility during ADLs: Moderate assistance (hemi walker) General ADL Comments: educated for patient to have wife don L AFO bed level as pt reports wife has back issues     Mobility   Overal bed mobility: Needs Assistance Bed Mobility: Supine to Sit Rolling: Used rails, Contact guard assist Supine to sit: Contact guard, HOB elevated, Used rails Sit to supine: Mod assist General bed mobility comments: increased time and effort, pt hooking LLE with RLE to assist to EOB, strong use of bedrail with RUE to assist in uprighting trunk     Transfers   Overall transfer level: Needs assistance Equipment used: Hemi-walker Transfers: Sit to/from Stand Sit to Stand: Min assist, From elevated surface Bed to/from chair/wheelchair/BSC transfer type:: Stand pivot Stand pivot transfers: Mod assist General transfer comment: min A to power up from elevated bed and steady, verbal cues for widening BOS and pt needing increased time to reposition; min A to power up from toilet with use of grab bar in bathroom, again  cues for BOS and hemiwalker positioning to improve safety     Ambulation / Gait / Stairs / Wheelchair Mobility   Ambulation/Gait Ambulation/Gait assistance: Editor, commissioning (Feet):  (15', 40', 35') Assistive device: Hemi-walker Gait Pattern/deviations: Step-to pattern, Decreased weight shift to left, Decreased dorsiflexion - left, Narrow base of support General Gait Details: step to gait pattern with trunk lean to R while advancing LLE, good hemiwalker placement, verbal cues for intermittent hemiwalker placement and path to safely navigate around obstacles at safe distance, recliner follow for seated rest breaks due to fatigue Gait velocity: decreased     Posture / Balance Dynamic Sitting Balance Sitting balance - Comments: posterior lean without single UE support; verbal/visual cues to lean anteriorly 2* posterior lean Balance Overall balance assessment: Needs assistance, History of Falls Sitting-balance support: Feet supported Sitting balance-Leahy Scale: Fair Sitting balance -  Comments: posterior lean without single UE support; verbal/visual cues to lean anteriorly 2* posterior lean Postural control: Posterior lean Standing balance support: Single extremity supported, Reliant on assistive device for balance, During functional activity Standing balance-Leahy Scale: Poor     Special needs/care consideration Special service needs intermittent catheterizations at home prior to admission, HOH, vegetarian and wife orders all meals        Previous Home Environment (from acute therapy documentation) Living Arrangements: [name]/significant other  Lives With: [name] Available Help at Discharge: Family, Available 24 hours/day Type of Home: House Home Layout: One level Home Access: Ramped entrance Entrance Stairs-Rails: Left, Right, Can reach both Entrance Stairs-Number of Steps: 5 STE prefers to use steps not ramp Bathroom Shower/Tub: Engineer, manufacturing systems: Handicapped  height Bathroom Accessibility: Yes Home Care Services: No (working on it) Additional Comments: pt reports being ambulatory for household distances using hemi walker. Pt has L PFRW, WC, shower chair/bench, bedside commode. Pt reports being independnet with cathing at home prior to admission.   Discharge Living Setting Plans for Discharge Living Setting: Patient's home Type of Home at Discharge: House Discharge Home Layout: One level Discharge Home Access: Ramped entrance Discharge Bathroom Shower/Tub: Tub/shower unit Discharge Bathroom Toilet: Handicapped height Discharge Bathroom Accessibility: Yes How Accessible: Accessible via walker Does the patient have any problems obtaining your medications?: No   Social/Family/Support Systems Patient Roles: [name], Parent Contact Information: [name] Anticipated Caregiver: Mark [name] ([name]) Anticipated Caregiver's Contact Information: [phone] Ability/Limitations of Caregiver: [name] can provide supervision but is limited in her ability to provide physical assist Caregiver Availability: 24/7 Discharge Plan Discussed with Primary Caregiver: Yes Is Caregiver In Agreement with Plan?: Yes Does Caregiver/Family have Issues with Lodging/Transportation while Pt is in Rehab?: No     Goals Patient/Family Goal for Rehab: PT/OT supervision, SLP n/a Expected length of stay: 6-9 days Additional Information: Discharge plan: return to pt's home where his [name] and family can provide 24/7 supervision Pt/Family Agrees to Admission and willing to participate: Yes Program Orientation Provided & Reviewed with Pt/Caregiver Including Roles  & Responsibilities: Yes     Decrease burden of Care through IP rehab admission: n/a     Possible need for SNF placement upon discharge: Not anticipated.  Pt expected to return to supervision level with brief CIR stay.  Home with [name] 24/7 as before.      Patient Condition: This patient's condition remains as  documented in the consult dated 08/05/23, in which the Rehabilitation Physician determined and documented that the patient's condition is appropriate for intensive rehabilitative care in an inpatient rehabilitation facility. Will admit to inpatient rehab today.   Preadmission Screen Completed By:  Mickey Alar, PT, DPT 08/05/2023 11:01 AM ______________________________________________________________________   Discussed status with Dr. Raulkaron 08/06/23 at 10:02 AM  and received approval for admission today.   Admission Coordinator:  Holleigh Crihfield E Amire Gossen, DPT time10:02 AM Alanna Hu 08/06/23             Revision History

## 2023-08-07 ENCOUNTER — Inpatient Hospital Stay (HOSPITAL_COMMUNITY)

## 2023-08-07 DIAGNOSIS — D693 Immune thrombocytopenic purpura: Secondary | ICD-10-CM

## 2023-08-07 DIAGNOSIS — N319 Neuromuscular dysfunction of bladder, unspecified: Secondary | ICD-10-CM

## 2023-08-07 DIAGNOSIS — I69354 Hemiplegia and hemiparesis following cerebral infarction affecting left non-dominant side: Secondary | ICD-10-CM

## 2023-08-07 LAB — CBC WITH DIFFERENTIAL/PLATELET
Abs Immature Granulocytes: 0.25 10*3/uL — ABNORMAL HIGH (ref 0.00–0.07)
Basophils Absolute: 0.1 10*3/uL (ref 0.0–0.1)
Basophils Relative: 1 %
Eosinophils Absolute: 0.5 10*3/uL (ref 0.0–0.5)
Eosinophils Relative: 5 %
HCT: 32.5 % — ABNORMAL LOW (ref 39.0–52.0)
Hemoglobin: 10.3 g/dL — ABNORMAL LOW (ref 13.0–17.0)
Immature Granulocytes: 3 %
Lymphocytes Relative: 32 %
Lymphs Abs: 3.1 10*3/uL (ref 0.7–4.0)
MCH: 29.6 pg (ref 26.0–34.0)
MCHC: 31.7 g/dL (ref 30.0–36.0)
MCV: 93.4 fL (ref 80.0–100.0)
Monocytes Absolute: 1.8 10*3/uL — ABNORMAL HIGH (ref 0.1–1.0)
Monocytes Relative: 18 %
Neutro Abs: 4.2 10*3/uL (ref 1.7–7.7)
Neutrophils Relative %: 41 %
Platelets: 421 10*3/uL — ABNORMAL HIGH (ref 150–400)
RBC: 3.48 MIL/uL — ABNORMAL LOW (ref 4.22–5.81)
RDW: 17.2 % — ABNORMAL HIGH (ref 11.5–15.5)
WBC: 9.8 10*3/uL (ref 4.0–10.5)
nRBC: 2.4 % — ABNORMAL HIGH (ref 0.0–0.2)

## 2023-08-07 LAB — COMPREHENSIVE METABOLIC PANEL WITH GFR
ALT: 26 U/L (ref 0–44)
AST: 28 U/L (ref 15–41)
Albumin: 2.8 g/dL — ABNORMAL LOW (ref 3.5–5.0)
Alkaline Phosphatase: 49 U/L (ref 38–126)
Anion gap: 11 (ref 5–15)
BUN: 23 mg/dL (ref 8–23)
CO2: 19 mmol/L — ABNORMAL LOW (ref 22–32)
Calcium: 8.7 mg/dL — ABNORMAL LOW (ref 8.9–10.3)
Chloride: 107 mmol/L (ref 98–111)
Creatinine, Ser: 1.24 mg/dL (ref 0.61–1.24)
GFR, Estimated: 60 mL/min — ABNORMAL LOW (ref >60)
Glucose, Bld: 103 mg/dL — ABNORMAL HIGH (ref 70–99)
Potassium: 4 mmol/L (ref 3.5–5.1)
Sodium: 137 mmol/L (ref 135–145)
Total Bilirubin: 0.6 mg/dL (ref 0.0–1.2)
Total Protein: 7.2 g/dL (ref 6.5–8.1)

## 2023-08-07 NOTE — Progress Notes (Signed)
 Inpatient Rehabilitation Admission Medication Review by a Pharmacist  A complete drug regimen review was completed for this patient to identify any potential clinically significant medication issues.  High Risk Drug Classes Is patient taking? Indication by Medication  Antipsychotic Yes, as an intravenous medication Prochlorperazine  - PRN nausea/vomiting  Anticoagulant Yes Rivaroxaban  - hx lupus AC/ hx DVT  Antibiotic No   Opioid No   Antiplatelet Yes Aspirin  - stroke ppx  Hypoglycemics/insulin  No   Vasoactive Medication No   Chemotherapy No   Other Yes Baclofen  - muscle spasms Sertraline , bupropion  - mood Gabapentin  - neuropathy Levetiracetam  - seizure ppx Melatonin - insomnia Pantoprazole  - GERD Fibercon, Florastor - supplement Rosuvastatin  - HLD Robitussin DM - cough PRN     Type of Medication Issue Identified Description of Issue Recommendation(s)  Drug Interaction(s) (clinically significant)     Duplicate Therapy     Allergy     No Medication Administration End Date     Incorrect Dose     Additional Drug Therapy Needed     Significant med changes from prior encounter (inform family/care partners about these prior to discharge). Amlodipine  not resumed at CIR admission Omeprazole > pantoprazole  (formulary change) Communicate relevant medication changes to patient/family members at discharge from CIR.   Other       Clinically significant medication issues were identified that warrant physician communication and completion of prescribed/recommended actions by midnight of the next day:  No  Pharmacist comments: n/a  Time spent performing this drug regimen review (minutes): 20   Thank you for allowing pharmacy to be a part of this patient's care.  Claudia Cuff, PharmD, BCPS Clinical Pharmacist

## 2023-08-07 NOTE — Plan of Care (Signed)
  Problem: RH Balance Goal: LTG Patient will maintain dynamic standing with ADLs (OT) Description: LTG:  Patient will maintain dynamic standing balance with assist during activities of daily living (OT)  Flowsheets (Taken 08/07/2023 1201) LTG: Pt will maintain dynamic standing balance during ADLs with: Supervision/Verbal cueing   Problem: Sit to Stand Goal: LTG:  Patient will perform sit to stand in prep for activites of daily living with assistance level (OT) Description: LTG:  Patient will perform sit to stand in prep for activites of daily living with assistance level (OT) Flowsheets (Taken 08/07/2023 1201) LTG: PT will perform sit to stand in prep for activites of daily living with assistance level: Supervision/Verbal cueing   Problem: RH Bathing Goal: LTG Patient will bathe all body parts with assist levels (OT) Description: LTG: Patient will bathe all body parts with assist levels (OT) Flowsheets (Taken 08/07/2023 1201) LTG: Pt will perform bathing with assistance level/cueing: Supervision/Verbal cueing   Problem: RH Dressing Goal: LTG Patient will perform upper body dressing (OT) Description: LTG Patient will perform upper body dressing with assist, with/without cues (OT). Flowsheets (Taken 08/07/2023 1201) LTG: Pt will perform upper body dressing with assistance level of: Set up assist Goal: LTG Patient will perform lower body dressing w/assist (OT) Description: LTG: Patient will perform lower body dressing with assist, with/without cues in positioning using equipment (OT) Flowsheets (Taken 08/07/2023 1201) LTG: Pt will perform lower body dressing with assistance level of: Supervision/Verbal cueing   Problem: RH Toileting Goal: LTG Patient will perform toileting task (3/3 steps) with assistance level (OT) Description: LTG: Patient will perform toileting task (3/3 steps) with assistance level (OT)  Flowsheets (Taken 08/07/2023 1201) LTG: Pt will perform toileting task (3/3 steps)  with assistance level: Supervision/Verbal cueing   Problem: RH Toilet Transfers Goal: LTG Patient will perform toilet transfers w/assist (OT) Description: LTG: Patient will perform toilet transfers with assist, with/without cues using equipment (OT) Flowsheets (Taken 08/07/2023 1201) LTG: Pt will perform toilet transfers with assistance level of: Supervision/Verbal cueing   Problem: RH Tub/Shower Transfers Goal: LTG Patient will perform tub/shower transfers w/assist (OT) Description: LTG: Patient will perform tub/shower transfers with assist, with/without cues using equipment (OT) Flowsheets (Taken 08/07/2023 1201) LTG: Pt will perform tub/shower stall transfers with assistance level of: Contact Guard/Touching assist

## 2023-08-07 NOTE — Progress Notes (Signed)
 Patient ID: Mark [name], male   DOB: [birth year], 78 y.o.   MRN: [id number]  SW spoke with pt wife to introduce self, explain role, and review d/c process. She confirms she is the only caregiver and it has been overwhelming. She reports that she will bring his clothes likely tomorrow. She is aware SW will f/u with updates.   Norval Been, MSW, LCSW Office: [phone] Cell: [phone] Fax: [phone]

## 2023-08-07 NOTE — Progress Notes (Signed)
 Patient ID: CHIEF WALKUP, male   DOB: [birth year], 78 y.o.   MRN: [id number] Met with the patient to review events since discharge on 07/21/23 from CIR. Reported doing well until developed chills, shakes and experienced a fall. Found to have urosepsis. Patient with neurogenic bladder and performing self caths PTA, supplies were sent to home after discharge and self caths without difficulty. Continue to follow along to address educational needs to facilitate preparation for discharge. Naoma Bacca

## 2023-08-07 NOTE — Progress Notes (Signed)
 Inpatient Rehabilitation  Patient information reviewed and entered into eRehab system by Feliberto Gottron, M.A., CCC-SLP, Rehab Quality Coordinator.  Information including medical coding, functional ability and quality indicators will be reviewed and updated through discharge.

## 2023-08-07 NOTE — Plan of Care (Signed)
  Problem: RH Balance Goal: LTG Patient will maintain dynamic standing balance (PT) Description: LTG:  Patient will maintain dynamic standing balance with assistance during mobility activities (PT) Flowsheets (Taken 08/07/2023 1254) LTG: Pt will maintain dynamic standing balance during mobility activities with:: Supervision/Verbal cueing   Problem: RH Car Transfers Goal: LTG Patient will perform car transfers with assist (PT) Description: LTG: Patient will perform car transfers with assistance (PT). Flowsheets (Taken 08/07/2023 1254) LTG: Pt will perform car transfers with assist:: Contact Guard/Touching assist   Problem: RH Furniture Transfers Goal: LTG Patient will perform furniture transfers w/assist (OT/PT) Description: LTG: Patient will perform furniture transfers  with assistance (OT/PT). Flowsheets (Taken 08/07/2023 1254) LTG: Pt will perform furniture transfers with assist:: Supervision/Verbal cueing   Problem: RH Ambulation Goal: LTG Patient will ambulate in controlled environment (PT) Description: LTG: Patient will ambulate in a controlled environment, # of feet with assistance (PT). Flowsheets (Taken 08/07/2023 1254) LTG: Pt will ambulate in controlled environ  assist needed:: Supervision/Verbal cueing LTG: Ambulation distance in controlled environment: 150 feet with LRAD Goal: LTG Patient will ambulate in home environment (PT) Description: LTG: Patient will ambulate in home environment, # of feet with assistance (PT). Flowsheets (Taken 08/07/2023 1254) LTG: Pt will ambulate in home environ  assist needed:: Supervision/Verbal cueing LTG: Ambulation distance in home environment: 75 feet with LRAD

## 2023-08-07 NOTE — Evaluation (Signed)
 Physical Therapy Assessment and Plan  Patient Details  Name: RUSS LOOPER MRN: [id number] Date of Birth: Nov 25, 1945  PT Diagnosis: Abnormal posture, Abnormality of gait, Difficulty walking, Hemiparesis non-dominant, Hemiplegia non-dominant, Impaired sensation, and Muscle weakness Rehab Potential: Good ELOS: 5-7 days   Today's Date: 08/07/2023 PT Individual Time: 1050-1202 PT Individual Time Calculation (min): 72 min    Hospital Problem: Principal Problem:   Debility   Past Medical History:  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    Lupus anticoagulant disorder (HCC)    Lupus anticoagulant syndrome (HCC)    Multiple closed anterior-posterior compression fractures of pelvis (HCC)    Seizures (HCC)    last seizure 10 years ago approx    Sleep apnea    cpap broken x 1 year per wife    Stroke (HCC) 02/27/2011   Left side weakness   Past Surgical History:  Past Surgical History:  Procedure Laterality Date   blood clot Right    surgical removal   CHOLECYSTECTOMY     CORONARY ANGIOPLASTY  1992   CYSTOGRAM N/A 02/17/2020   Procedure: CYSTOGRAM;  Surgeon: Andrez Banker, MD;  Location: [facility];  Service: Urology;  Laterality: N/A;   CYSTOSCOPY WITH RETROGRADE PYELOGRAM, URETEROSCOPY AND STENT PLACEMENT Bilateral 02/17/2020   Procedure: Deliliah Fender  URETEROSCOPY AND STENT PLACEMENT;  Surgeon: Andrez Banker, MD;  Location: [facility];  Service: Urology;  Laterality: Bilateral;   SPLENECTOMY, TOTAL     vertebralplasty      Assessment & Plan Clinical Impression: Patient is a 78 y.o. year old male with   history of  HTN, OSA, CVA w/L-HP,  lupus anticoagulant- on Xarelto , neurogenic bladder, seizure d/o, relapsed ITP with severe thrombocytopenia treated with IVIG, Nplate  and decadron  with hospital stay 03/21-->CIR and d/c to home 07/22/23. He was readmitted to [facility] on 07/28/23 with rigors and chills, hypotension and leucocytosis [id number] due to urosepsis. Work up revealed E coli UTI/BC negative. He was treated with IVF and 7 day course of antibiotics (Ceftriaxone  to cefadoxil) thru 04/20.     He started having drop in platelets on 04/15 and was treated with IV decadron , Nplate  and IVIG per Dr. Birt Bulla input. ASA was discontinued and Xarelto  placed on hold. Foley was placed to manage neurogenic bladder.  Renal failure is resolving, hypokalemia has resolved and platelets have recovered to  375 therefore Xarelto  resumed and recommendations to resume ASA at discharge. Therapy was consulted to work with patient who is noted to be limited by weakness due to debility, has balance deficits and is requiring min assist overall. He was at supervision level PTA and CIR was recommended due to functional decline.      Patient currently requires min with mobility secondary to muscle weakness, decreased cardiorespiratoy endurance, decreased coordination, and decreased standing balance, hemiplegia, and decreased balance strategies.  Prior to hospitalization, patient was supervision with mobility and lived with Spouse in a House home.  Home access is 5 STE prefers to use steps not rampRamped entrance.  Patient will benefit from skilled PT  intervention to maximize safe functional mobility, minimize fall risk, and decrease caregiver burden for planned discharge home with 24 hour supervision.  Anticipate patient will benefit from follow up HH at discharge.  PT - End of Session Activity Tolerance: Tolerates 30+ min activity with multiple rests Endurance Deficit: Yes Endurance Deficit Description: "I feel like I get out of  breath easily" PT Assessment Rehab Potential (ACUTE/IP ONLY): Good PT Barriers to Discharge Comments: L hemiparesis with hypertnia, decreased balance/coordination PT Patient demonstrates impairments in the following area(s): Balance;Endurance;Motor;Sensory PT Transfers Functional Problem(s): Bed Mobility;Bed to Chair;Car;Furniture PT Locomotion Functional Problem(s): Ambulation PT Plan PT Intensity: Minimum of 1-2 x/day ,45 to 90 minutes PT Frequency: 5 out of 7 days PT Duration Estimated Length of Stay: 5-7 days PT Treatment/Interventions: Ambulation/gait training;Discharge planning;Functional mobility training;Psychosocial support;Therapeutic Activities;Visual/perceptual remediation/compensation;Balance/vestibular training;Disease management/prevention;Neuromuscular re-education;Skin care/wound management;Therapeutic Exercise;Wheelchair propulsion/positioning;Cognitive remediation/compensation;DME/adaptive equipment instruction;Pain management;Splinting/orthotics;UE/LE Strength taining/ROM;Community reintegration;Functional electrical stimulation;Patient/family education;Stair training;UE/LE Coordination activities PT Transfers Anticipated Outcome(s): supervision PT Locomotion Anticipated Outcome(s): supervision PT Recommendation Recommendations for Other Services: Therapeutic Recreation consult Therapeutic Recreation Interventions: Pet therapy;Stress management Follow Up Recommendations: Home health PT Patient destination: Home Equipment Details: has hemi walker, L PFRW, and WC, and ramp and L LE AFO   PT Evaluation Precautions/Restrictions Precautions Precautions: Fall Recall of Precautions/Restrictions: Impaired Precaution/Restrictions Comments: L hemiplegia Required Braces or Orthoses: Other Brace Other Brace: L AFO Restrictions Weight Bearing Restrictions Per Provider Order: No Pain Pain Assessment Pain Scale: 0-10 Pain Score: 0-No pain Pain Interference Pain  Interference Pain Effect on Sleep: 0. Does not apply - I have not had any pain or hurting in the past 5 days Pain Interference with Therapy Activities: 0. Does not apply - I have not received rehabilitationtherapy in the past 5 days Pain Interference with Day-to-Day Activities: 1. Rarely or not at all Home Living/Prior Functioning Home Living Available Help at Discharge: Family;Available 24 hours/day Type of Home: House Home Access: Ramped entrance Entrance Stairs-Number of Steps: 5 STE prefers to use steps not ramp Entrance Stairs-Rails: Left;Right;Can reach both Home Layout: One level Bathroom Shower/Tub: Engineer, manufacturing systems: Handicapped height Bathroom Accessibility: Yes Additional Comments: pt reports being ambulatory for household distances using hemi walker. Pt has L PFRW, WC, shower chair/bench, bedside commode. Pt reports being independnet with cathing at home prior to admission.  Lives With: Spouse Prior Function Level of Independence: Requires assistive device for independence  Able to Take Stairs?: No Driving: No (pt reports he has not driven in 12 years) Vocation: Retired Vision/Perception  Vision - History Ability to See in Adequate Light: 0 Adequate Perception Perception: Impaired Preception Impairment Details: Inattention/Neglect Perception-Other Comments: L inattention/neglect from previous Stroke in Nov 2012 Praxis Praxis: Impaired Praxis Impairment Details: Motor planning  Cognition Overall Cognitive Status: Within Functional Limits for tasks assessed Arousal/Alertness: Awake/alert Orientation Level: Oriented X4 Year: 2025 Month: April Day of Week: Correct Sustained Attention: Appears intact Memory: Appears intact Safety/Judgment: Appears intact Sensation Sensation Light Touch: Impaired Detail Light Touch Impaired Details: Impaired RLE;Impaired LLE Hot/Cold: Not tested Proprioception: Impaired Detail Proprioception Impaired Details:  Impaired LLE Additional Comments: decreased sensation along LLE, absent sensation along bilateral great toes. plantarflexion and inversion tone in L ankle. Pt reports N&T in R UE 2/2 shingles. Coordination Gross Motor Movements are Fluid and Coordinated: No Fine Motor Movements are Fluid and Coordinated: No Coordination and Movement Description: grossly uncoordinated due to L hemiparesis, tone in LUE/LLE, and decreased balance/coordination Finger Nose Finger Test: unable to perform on LUE due to prior CVA Heel  Shin Test: unable to perform on LLE Motor  Motor Motor: Hemiplegia;Abnormal tone Motor - Skilled Clinical Observations: L hemiparesis and hypertonia in LUE/LLE due to prior CVA - now generalized weakness/deconditioning  Trunk/Postural Assessment  Cervical Assessment Cervical Assessment: Exceptions to [facility] (forward head with R rotation) Thoracic Assessment Thoracic Assessment: Exceptions to [facility] (rounded shoulders) Lumbar Assessment Lumbar Assessment: Exceptions to [facility] (posterior pelvic tilt) Postural Control Postural Control: Deficits on evaluation Righting Reactions: delayed on L Protective Responses: delayed on L  Balance Balance Balance Assessed: Yes Static Sitting Balance Static Sitting - Balance Support: Feet supported;No upper extremity supported Static Sitting - Level of Assistance: 7: Independent Dynamic Sitting Balance Dynamic Sitting - Balance Support: Feet supported;No upper extremity supported Dynamic Sitting - Level of Assistance: 5: Stand by assistance Static Standing Balance Static Standing - Balance Support: Right upper extremity supported;During functional activity Static Standing - Level of Assistance: 4: Min assist;5: Stand by assistance Dynamic Standing Balance Dynamic Standing - Balance Support: Right upper extremity supported;During functional activity Dynamic Standing - Level of Assistance: 4: Min assist Extremity Assessment  RLE Assessment RLE  Assessment: Exceptions to [facility] General Strength Comments: not formally assessed, grossly 4/5 LLE Assessment LLE Assessment: Exceptions to [facility] General Strength Comments: not formally assessed, grossly 3+/5 with exception of ankle ROM grossly 1/5  Care Tool Care Tool Bed Mobility Roll left and right activity   Roll left and right assist level: Supervision/Verbal cueing    Sit to lying activity   Sit to lying assist level: Contact Guard/Touching assist    Lying to sitting on side of bed activity   Lying to sitting on side of bed assist level: the ability to move from lying on the back to sitting on the side of the bed with no back support.: Supervision/Verbal cueing     Care Tool Transfers Sit to stand transfer   Sit to stand assist level: Contact Guard/Touching assist    Chair/bed transfer   Chair/bed transfer assist level: Minimal Assistance - Patient > 75%    Car transfer   Car transfer assist level: Minimal Assistance - Patient > 75%      Care Tool Locomotion Ambulation   Assist level: Contact Guard/Touching assist Assistive device: Walker-hemi    Walk 10 feet activity   Assist level: Contact Guard/Touching assist     Walk 50 feet with 2 turns activity   Assist level: Contact Guard/Touching assist    Walk 150 feet activity    Safety/Medical     Walk 10 feet on uneven surfaces activity    Safety/Medical (fatigue)    Stairs    Safety Medical (fatigue)      Walk up/down 1 step activity    Safety Medical (fatigue)    Walk up/down 4 steps activity    Safety Medical (fatigue)    Walk up/down 12 steps activity    Safety Medical (fatigue)    Pick up small objects from floor    Dependent     Wheelchair      Set up assist       Wheel 50 feet with 2 turns activity    Set up assist   Wheel 150 feet activity    Set up assist    Refer to Care Plan for Long Term Goals  SHORT TERM GOAL WEEK 1 PT Short Term Goal 1 (Week 1): STG=LTG 2/2  ELOS  Recommendations for other services: None   Skilled Therapeutic Intervention Mobility Bed Mobility Bed Mobility: Rolling Right;Rolling Left;Sit to  Supine;Supine to Sit Rolling Right: Supervision/verbal cueing Rolling Left: Supervision/Verbal cueing Supine to Sit: Supervision/Verbal cueing Sit to Supine: Supervision/Verbal cueing Transfers Transfers: Sit to Stand;Stand to Sit;Stand Pivot Transfers Sit to Stand: Contact Guard/Touching assist Stand to Sit: Contact Guard/Touching assist Stand Pivot Transfers: Minimal Assistance - Patient > 75% Stand Pivot Transfer Details: Verbal cues for sequencing;Verbal cues for technique Transfer (Assistive device): Hemi-walker Locomotion  Gait Ambulation: Yes Gait Assistance: Contact Guard/Touching assist Gait Distance (Feet): 70 Feet Assistive device: Hemi-walker Gait Assistance Details: Verbal cues for gait pattern;Verbal cues for sequencing;Verbal cues for technique;Verbal cues for safe use of DME/AE Gait Gait: Yes Gait Pattern: Impaired Gait Pattern: Step-to pattern;Decreased step length - right;Decreased step length - left;Decreased stride length;Decreased dorsiflexion - left;Decreased hip/knee flexion - left;Decreased weight shift to left;Poor foot clearance - right;Poor foot clearance - left;Narrow base of support Gait velocity: decreased Stairs / Additional Locomotion Stairs: No Corporate treasurer: Yes Wheelchair Assistance: Set up Education officer, museum: Right upper extremity;Right lower extremity Wheelchair Parts Management: Needs assistance Distance: 150   Discharge Criteria: Patient will be discharged from PT if patient refuses treatment 3 consecutive times without medical reason, if treatment goals not met, if there is a change in medical status, if patient makes no progress towards goals or if patient is discharged from hospital.  The above assessment, treatment plan, treatment alternatives and  goals were discussed and mutually agreed upon: by patient  Evaluation completed (see details above and below) with education on PT POC and goals and individual treatment initiated with focus on transfer training.   Therapist switched out 18x18 hemi height WC, for 16x18 standard height WC for appropriate fit, with use of L UE arm tray.   Pt ambulated 70 feet with HW and L LE AFO and CGA with WC in tow, verbal cues provided for safety with hemi walker, sequencing, and not holding breath. Pt reports feeling winded after ambulation trial.   Pt performed sit to to stand throughout session with HW and CGA, and stand pivot transfer with HW and min A, verabl cues provided for technique with emphasis on not sitting prematurely. Verabl cues provided for R LE positioning.   Pt perfomred stand pivot transfer into car simulator with HW and min A, pt required min A for lifting L LE into car simulator, verbal cues provided for technique.   Pt performed bed mobility on flat bed with use of unilateral handrail on R side of bed per pt home set up and CGA/supervision.   Pt seated in WC at end of session with all needs within reach and seatbelt alarm on.    [facility], Sarita, DPT  08/07/2023, 11:12 AM

## 2023-08-07 NOTE — Evaluation (Signed)
 Occupational Therapy Assessment and Plan  Patient Details  Name: Mark [name] MRN: [id number] Date of Birth: Oct 15, 1945  OT Diagnosis: muscle weakness (generalized) Rehab Potential: Rehab Potential (ACUTE ONLY): Good ELOS: 7 days   Today's Date: 08/07/2023 OT Individual Time: 1027-2536 OT Individual Time Calculation (min): 70 min     Hospital Problem: Principal Problem:   Debility   Past Medical History:  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    Lupus anticoagulant disorder (HCC)    Lupus anticoagulant syndrome (HCC)    Multiple closed anterior-posterior compression fractures of pelvis (HCC)    Seizures (HCC)    last seizure 10 years ago approx    Sleep apnea    cpap broken x 1 year per wife    Stroke (HCC) 02/27/2011   Left side weakness   Past Surgical History:  Past Surgical History:  Procedure Laterality Date   blood clot Right    surgical removal   CHOLECYSTECTOMY     CORONARY ANGIOPLASTY  1992   CYSTOGRAM N/A 02/17/2020   Procedure: CYSTOGRAM;  Surgeon: Andrez Banker, MD;  Location: [facility];  Service: Urology;  Laterality: N/A;   CYSTOSCOPY WITH RETROGRADE PYELOGRAM, URETEROSCOPY AND STENT PLACEMENT Bilateral 02/17/2020   Procedure: Deliliah Fender  URETEROSCOPY AND STENT PLACEMENT;  Surgeon: Andrez Banker, MD;  Location: [facility];  Service: Urology;  Laterality: Bilateral;   SPLENECTOMY, TOTAL     vertebralplasty      Assessment & Plan Clinical Impression:  Mark [name] is a 78 year old male with history of  HTN, OSA, CVA w/L-HP,  lupus anticoagulant- on Xarelto , neurogenic  bladder, seizure d/o, relapsed ITP with severe thrombocytopenia treated with IVIG, Nplate  and decadron  with hospital stay 03/21-->CIR and d/c to home 07/22/23. He was readmitted to [facility] on 07/28/23 with rigors and chills, hypotension and leucocytosis [id number] due to urosepsis. Work up revealed E coli UTI/BC negative. He was treated with IVF and 7 day course of antibiotics (Ceftriaxone  to cefadoxil) thru 04/20.     He started having drop in platelets on 04/15 and was treated with IV decadron , Nplate  and IVIG per Dr. Birt Bulla input. ASA was discontinued and Xarelto  placed on hold. Foley was placed to manage neurogenic bladder.  Renal failure is resolving, hypokalemia has resolved and platelets have recovered to  375 therefore Xarelto  resumed and recommendations to resume ASA at discharge. Therapy was consulted to work with patient who is noted to be limited by weakness due to debility, has balance deficits and is requiring min assist overall. He was at supervision level PTA and CIR was recommended due to functional decline.   .  Patient transferred to CIR on 08/06/2023 .    Patient currently requires min with basic self-care skills secondary to muscle weakness, decreased cardiorespiratoy endurance, unbalanced muscle activation and decreased coordination, and decreased standing balance, decreased postural control, hemiplegia, and decreased balance strategies.  Prior to hospitalization, patient could complete BADLs with supervision.  Patient will benefit from skilled intervention to increase independence with basic self-care skills prior to  discharge home with care partner.  Anticipate patient will require intermittent supervision and no further OT follow recommended.  OT - End of Session Activity Tolerance: Tolerates 30+ min activity with multiple rests Endurance Deficit: Yes Endurance Deficit Description: "I feel like I get out of breath easily" OT Assessment Rehab Potential (ACUTE ONLY): Good OT Patient  demonstrates impairments in the following area(s): Balance;Endurance;Motor;Safety;Sensory;Perception OT Basic ADL's Functional Problem(s): Grooming;Bathing;Dressing;Toileting OT Transfers Functional Problem(s): Toilet;Tub/Shower OT Additional Impairment(s): None OT Plan OT Intensity: Minimum of 1-2 x/day, 45 to 90 minutes OT Frequency: 5 out of 7 days OT Duration/Estimated Length of Stay: 7 days OT Treatment/Interventions: Balance/vestibular training;Discharge planning;DME/adaptive equipment instruction;Functional mobility training;Neuromuscular re-education;Disease mangement/prevention;Patient/family education;Psychosocial support;Self Care/advanced ADL retraining;Therapeutic Activities;Therapeutic Exercise;UE/LE Strength taining/ROM;UE/LE Coordination activities OT Self Feeding Anticipated Outcome(s): no goal, pt is set up only A OT Basic Self-Care Anticipated Outcome(s): supervision OT Toileting Anticipated Outcome(s): supervision OT Bathroom Transfers Anticipated Outcome(s): supervision to toilet; CGA to tub/shower OT Recommendation Patient destination: Home Follow Up Recommendations: None Equipment Recommended: None recommended by OT   OT Evaluation Precautions/Restrictions  Precautions Precautions: Fall Recall of Precautions/Restrictions: Impaired Precaution/Restrictions Comments: L hemiplegia Required Braces or Orthoses: Other Brace Other Brace: L AFO Restrictions Weight Bearing Restrictions Per Provider Order: No Pain Pain Assessment Pain Scale: 0-10 Pain Score: 0-No pain Home Living/Prior Functioning Home Living Available Help at Discharge: Family, Available 24 hours/day Type of Home: House Home Access: Ramped entrance Entrance Stairs-Number of Steps: 5 STE prefers to use steps not ramp Entrance Stairs-Rails: Left, Right, Can reach both Home Layout: One level Bathroom Shower/Tub: Engineer, manufacturing systems: Handicapped height Bathroom Accessibility:  Yes Additional Comments: pt reports being ambulatory for household distances using hemi walker. Pt has L PFRW, WC, shower chair/bench, bedside commode. Pt reports being independnet with cathing at home prior to admission.  Lives With: Spouse Prior Function Level of Independence: Requires assistive device for independence  Able to Take Stairs?: No Driving: No (pt reports he has not driven in 12 years) Vocation: Retired Administrator, sports Baseline Vision/History: 0 No visual deficits Ability to See in Adequate Light: 0 Adequate Patient Visual Report: No change from baseline Vision Assessment?: No apparent visual deficits Perception  Perception: Impaired Perception-Other Comments: L inattention/neglect from previous Stroke in Nov 2012 Praxis Praxis: Impaired Praxis Impairment Details: Landscape architect Overall Cognitive Status: Within Functional Limits for tasks assessed Arousal/Alertness: Awake/alert Memory: Appears intact Sustained Attention: Appears intact Safety/Judgment: Appears intact Brief Interview for Mental Status (BIMS) Repetition of Three Words (First Attempt): 3 Temporal Orientation: Year: Correct Temporal Orientation: Month: Accurate within 5 days Temporal Orientation: Day: Correct Recall: "Sock": Yes, no cue required Recall: "Blue": Yes, no cue required Recall: "Bed": Yes, no cue required BIMS Summary Score: 15 Sensation Sensation Light Touch: Impaired Detail Light Touch Impaired Details: Impaired RLE;Impaired LLE Hot/Cold: Appears Intact Proprioception: Impaired Detail Proprioception Impaired Details: Impaired LLE Additional Comments: decreased sensation along LLE, absent sensation along bilateral great toes. plantarflexion and inversion tone in L ankle. Pt reports N&T in R UE 2/2 shingles. Coordination Gross Motor Movements are Fluid and Coordinated: No Fine Motor Movements are Fluid and Coordinated: No Coordination and Movement Description: grossly  uncoordinated due to L hemiparesis, tone in LUE/LLE, and decreased balance/coordination Finger Nose Finger Test: unable to perform on LUE due to prior CVA Heel Shin Test: unable to perform on LLE Motor  Motor Motor: Hemiplegia;Abnormal tone Motor - Skilled Clinical Observations: L hemiparesis and hypertonia in LUE/LLE due to prior CVA - now generalized weakness/deconditioning  Trunk/Postural  Assessment  Postural Control Postural Control: Deficits on evaluation Righting Reactions: delayed on L Protective Responses: delayed on L  Balance Static Sitting Balance Static Sitting - Level of Assistance: 7: Independent Dynamic Sitting Balance Dynamic Sitting - Level of Assistance: 5: Stand by assistance Static Standing Balance Static Standing - Level of Assistance: 4: Min assist;5: Stand by assistance Dynamic Standing Balance Dynamic Standing - Level of Assistance: 4: Min assist Extremity/Trunk Assessment RUE Assessment RUE Assessment: Within Functional Limits LUE Assessment General Strength Comments: unable to use L hand or arm functionally during ADLS due to hypertonicity and hemiplegia (has been at this level for 13 years)  Care Tool Care Tool Self Care Eating   Eating Assist Level: Set up assist    Oral Care    Oral Care Assist Level: Supervision/Verbal cueing    Bathing   Body parts bathed by patient: Chest;Abdomen;Front perineal area;Right upper leg;Left upper leg;Right lower leg;Left lower leg;Face;Left arm;Buttocks Body parts bathed by helper: Right arm   Assist Level: Minimal Assistance - Patient > 75%    Upper Body Dressing(including orthotics)   What is the patient wearing?: Pull over shirt   Assist Level: Minimal Assistance - Patient > 75%    Lower Body Dressing (excluding footwear)   What is the patient wearing?: Underwear/pull up;Pants Assist for lower body dressing: Moderate Assistance - Patient 50 - 74%    Putting on/Taking off footwear   What is the patient  wearing?: Non-skid slipper socks Assist for footwear: Maximal Assistance - Patient 25 - 49%       Care Tool Toileting Toileting activity   Assist for toileting: Minimal Assistance - Patient > 75%     Care Tool Bed Mobility Roll left and right activity   Roll left and right assist level: Supervision/Verbal cueing    Sit to lying activity        Lying to sitting on side of bed activity   Lying to sitting on side of bed assist level: the ability to move from lying on the back to sitting on the side of the bed with no back support.: Supervision/Verbal cueing     Care Tool Transfers Sit to stand transfer   Sit to stand assist level: Contact Guard/Touching assist    Chair/bed transfer   Chair/bed transfer assist level: Minimal Assistance - Patient > 75%     Toilet transfer   Assist Level: Minimal Assistance - Patient > 75%     Care Tool Cognition  Expression of Ideas and Wants Expression of Ideas and Wants: 4. Without difficulty (complex and basic) - expresses complex messages without difficulty and with speech that is clear and easy to understand  Understanding Verbal and Non-Verbal Content Understanding Verbal and Non-Verbal Content: 4. Understands (complex and basic) - clear comprehension without cues or repetitions   Memory/Recall Ability Memory/Recall Ability : Current season;Location of own room;Staff names and faces;That he or she is in a hospital/hospital unit   Refer to Care Plan for Long Term Goals  SHORT TERM GOAL WEEK 1 OT Short Term Goal 1 (Week 1): STGs = LTGs  Recommendations for other services: None    Skilled Therapeutic Intervention ADL ADL Eating: Set up Grooming: Supervision/safety Upper Body Bathing: Minimal assistance Lower Body Bathing: Supervision/safety Where Assessed-Upper Body Dressing: Chair Lower Body Dressing: Moderate assistance Where Assessed-Lower Body Dressing: Chair Toileting: Minimal assistance Toilet Transfer: Minimal  assistance Toilet Transfer Method: Ambulating Psychologist, counselling Transfer: Minimal assistance Film/video editor Method: Designer, industrial/product: Surveyor, mining  bench;Other (comment)    Pt seen for initial evaluation and ADL training with a focus on safe mobility with hemiwalker. Pt received in bed and eager to shower.  OT had to make adjustments to equipment in the bathroom to accommodate his height.  Pt needed to toilet first.  Pt sat up and then used hemiwalker to ambulate to toilet and then to shower with min A and extra time needed as pt was feeling weaker than his baseline level at PLOF.  He did not have his own clothing so used hospital briefs and disposable scrub pants. Pt did require min A with this clothing but may do better with his own clothes.   Pt did discuss that he has been feeling out of breath and much more fatigued after this UTI.  Overall excellent participation.   Reviewed role of OT, discussed POC and pt's goals, and ELOS. Pt resting in recliner With all needs met and belt alarm set.     Discharge Criteria: Patient will be discharged from OT if patient refuses treatment 3 consecutive times without medical reason, if treatment goals not met, if there is a change in medical status, if patient makes no progress towards goals or if patient is discharged from hospital.  The above assessment, treatment plan, treatment alternatives and goals were discussed and mutually agreed upon: by patient  [facility] 08/07/2023, 11:51 AM

## 2023-08-07 NOTE — Hospital Course (Signed)
 Mark [name] is a 78 y.o. male with a history of DVT, seizure, stroke with residual left hemiparesis, neurogenic bladder.  Patient presented secondary to fevers and rigors with associated weakness and foul-smelling urine, found to have sepsis secondary to UTI. Cultures obtained and empiric antibiotics started. Ceftriaxone  transitioned to Cefadroxil  once culture data was available. Hospitalization complicated by mild thrombocytopenia, treated as acute ITP. Patient discharged to acute inpatient rehabilitation.

## 2023-08-07 NOTE — Progress Notes (Signed)
 PROGRESS NOTE   Subjective/Complaints:  Pt slept ok, reviewed hx with pt Discussed hand splint and PRAFO which pt thinks he may have at home but not sure   Reviewd labs  ROS- no CP, SOB, N/V/D  Objective:   No results found. Recent Labs    08/06/23 0607 08/07/23 0606  WBC 11.8* 9.8  HGB 9.9* 10.3*  HCT 32.9* 32.5*  PLT 375 421*   Recent Labs    08/06/23 0607 08/07/23 0606  NA 137 137  K 3.6 4.0  CL 108 107  CO2 21* 19*  GLUCOSE 100* 103*  BUN 28* 23  CREATININE 1.27* 1.24  CALCIUM  8.6* 8.7*    Intake/Output Summary (Last 24 hours) at 08/07/2023 0738 Last data filed at 08/07/2023 0600 Gross per 24 hour  Intake 240 ml  Output 3000 ml  Net -2760 ml        Physical Exam: Vital Signs Blood pressure 130/66, pulse 65, temperature 97.9 F (36.6 C), resp. rate 17, height 5\' 10"  (1.778 m), weight 87 kg, SpO2 95%.   General: No acute distress Mood and affect are appropriate Heart: Regular rate and rhythm no rubs murmurs or extra sounds Lungs: Clear to auscultation, breathing unlabored, no rales or wheezes Abdomen: Positive bowel sounds, soft nontender to palpation, +distended, + tympany Extremities: No clubbing, cyanosis, or edema Skin: No evidence of breakdown, no evidence of rash Neurologic: oriented to person and place not time (off by 1 d)  motor strength is 5/5 in right and  trace left deltoid, bicep, tricep, grip, hip flexor, knee extensors, ankle dorsiflexor and plantar flexor Tone increased left hand lumbricals and wrist flexors as well as Left foot plantar flexors and invertors Sensory exam normal sensation to light touch and proprioception in bilateral upper and lower extremities Cerebellar exam unable to perform on left due to weakness Musculoskeletal: Reduced ROM Left shoulder and hip with tone influence   Assessment/Plan: 1. Functional deficits which require 3+ hours per day of  interdisciplinary therapy in a comprehensive inpatient rehab setting. Physiatrist is providing close team supervision and 24 hour management of active medical problems listed below. Physiatrist and rehab team continue to assess barriers to discharge/monitor patient progress toward functional and medical goals  Care Tool:  Bathing              Bathing assist       Upper Body Dressing/Undressing Upper body dressing        Upper body assist      Lower Body Dressing/Undressing Lower body dressing            Lower body assist       Toileting Toileting    Toileting assist       Transfers Chair/bed transfer  Transfers assist           Locomotion Ambulation   Ambulation assist              Walk 10 feet activity   Assist           Walk 50 feet activity   Assist           Walk 150 feet activity   Assist  Walk 10 feet on uneven surface  activity   Assist           Wheelchair     Assist               Wheelchair 50 feet with 2 turns activity    Assist            Wheelchair 150 feet activity     Assist          Blood pressure 130/66, pulse 65, temperature 97.9 F (36.6 C), resp. rate 17, height 5\' 10"  (1.778 m), weight 87 kg, SpO2 95%.  Medical Problem List and Plan: 1. Functional deficits secondary to debility 2/2 urosepsis and AKI             -patient may shower             -ELOS/Goals: 7 days S             Admit to CIR   2.  Lupus anticoagulant/Antithrombotics: -DVT/anticoagulation:  Pharmaceutical: Xarelto              -antiplatelet therapy: ASA resumed.   3. Pain Management: tylenol  prn   4. Mood/Behavior/Sleep: LCSW to evaluate and provide support prn.              -antipsychotic agents: N/A             --melatonin prn for insomnia.    5. Neuropsych/cognition: This patient is capable of making decisions on his own behalf.   6. Skin/Wound Care: routine pressure relief  measures.    7. Fluids/Electrolytes/Nutrition: Monitor I/O. Check CMET in am.     Latest Ref Rng & Units 08/07/2023    6:06 AM 08/06/2023    6:07 AM 08/05/2023    6:05 AM  BMP  Glucose 70 - 99 mg/dL 161  096  045   BUN 8 - 23 mg/dL 23  28  31    Creatinine 0.61 - 1.24 mg/dL 4.09  8.11  9.14   Sodium 135 - 145 mmol/L 137  137  134   Potassium 3.5 - 5.1 mmol/L 4.0  3.6  3.7   Chloride 98 - 111 mmol/L 107  108  105   CO2 22 - 32 mmol/L 19  21  22    Calcium  8.9 - 10.3 mg/dL 8.7  8.6  8.3       8. Urosepsis/E coli UTI: Treated with antibiotics thru 04/20   9. Acute renal failure: resolving. Continue to monitor   10. HTN: Monitor BP TID   11. Chronic intermittent diarrhea: continue probiotics. Add fiber   12. Neurogenic bladder: Discontinue foley --Resume I/O caths 4-6 hours to keep bladder volumes < 350 cc Seen by Dr Dulcy Gibney as OP    13. Leucocytosis: Resolving from 23.9--> 11.8.    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    Latest Ref Rng & Units 08/07/2023    6:06 AM 08/06/2023    6:07 AM 08/05/2023    6:05 AM  CBC  WBC 4.0 - 10.5 K/uL 9.8  11.8  14.1   Hemoglobin 13.0 - 17.0 g/dL 78.2  9.9  95.6   Hematocrit 39.0 - 52.0 % 32.5  32.9  33.8   Platelets 150 - 400 K/uL 421  375  332       15. Severe Neuropathy BLE w/gait d/o: scheduled for outpatient  Qutenza-has  sensation at ankle on right but not left, no LT sensation at feet    16. H/o stroke with Left spastic HP: Continue low dose ASA, Xarelto  and baclofen .  Needs resting hand splint and PRAFO left side, thinks he has these at home , will need wife to bring in    17. Seizure d/o: Stable on Keppra  1500 mg BID   18. H/o depression/anxiety: continue Sertraline  and Bupropion .     19.  Abd distention - ? Bladder distention +/- abd gas , check KUB, as well as bladder scan     LOS: 1 days A FACE TO FACE EVALUATION WAS PERFORMED  Genetta Kenning 08/07/2023, 7:38 AM

## 2023-08-07 NOTE — Progress Notes (Signed)
 Physical Therapy Session Note  Patient Details  Name: Mark [name] MRN: [id number] Date of Birth: 1946/02/25  Today's Date: 08/07/2023 PT Individual Time: 1308-1403 PT Individual Time Calculation (min): 55 min   Short Term Goals: Week 1:     Skilled Therapeutic Interventions/Progress Updates: Patient sitting in [facility] on entrance to room. Patient alert and agreeable to PT session.   Discussed with evaluating PT prior to sessions about pt's current presentation and goals set to supervision. Patient with no complaints of pain during session. Pt stated not being able to locate pink case containing dentures. PTA communicated with proper channels to find the case as pt was moved from 61M to 6W earlier in day.  Therapeutic Activity: Bed Mobility: Pt pivoted on hi/low mat from short sitting<>long sitting with CGA for safety Transfers: Pt performed sit<>stand transfers throughout session with CGA/minA. Provided VC for HW placement.  - Attempted TUG test with HW. Pt's L heel began to slide out of shoe and test deferred. Pt sat to WC. [employer] rep already present in room and asked to provide education on pt's AFO that has tightening ability around top strap. AFO needs to be donned with only heel on ground to avoid plantarflexor tone with shoe straps and AFO strap tightened as much as able. Pt ambulated short distance with [employer] rep present. Pt L foot noted to have improved step pattern without heel sliding out. Pt increased step length on R LE, which caused L heel to present with increased tone, but not when taking shorter steps (slightly past step-to pattern). Pt sat to mat to perform stretching of plantarflexors noted in therex.   Following therex, pt ambulated short distance (roughly 40') in main gym with light minA that progressed closer to CGA with improvement in cadence, safety and heel-off without sliding out (still present when cued to increase length on R LE step - pt reported this trial of  ambulation felt the best so far since evaluation earlier in morning). Pt informed that future sessions will consist of stretching L LE plantarflexors/knee flexors prior to gait to improve overall ambulatory mechanics.   Therapeutic Exercise: Pt performed the following exercises with therapist providing the described cuing and facilitation for improvement. - Pt long sitting on mat   - contract-relax initiated of L plantarflexors to increase ROM due to increased tone presentation (plantarflexion + inversion), and 10lb ankle weight on L knee to lengthen knee flexors/gastrocnemius.  Patient sitting in WC at end of session with brakes locked, belt alarm set, and all needs within reach.      Therapy Documentation Precautions:  Precautions Precautions: Fall Recall of Precautions/Restrictions: Impaired Precaution/Restrictions Comments: L hemiplegia Required Braces or Orthoses: Other Brace Other Brace: L AFO Restrictions Weight Bearing Restrictions Per Provider Order: No  Therapy/Group: Individual Therapy  Harmon Bommarito PTA 08/07/2023, 2:31 PM

## 2023-08-08 ENCOUNTER — Other Ambulatory Visit (HOSPITAL_COMMUNITY): Payer: Self-pay

## 2023-08-08 MED ORDER — ADULT MULTIVITAMIN W/MINERALS CH
1.0000 | ORAL_TABLET | Freq: Every day | ORAL | Status: DC
  Administered 2023-08-08: 1
  Filled 2023-08-08: qty 1

## 2023-08-08 MED ORDER — BOOST PLUS PO LIQD
237.0000 mL | ORAL | Status: DC
  Administered 2023-08-08 – 2023-08-13 (×6): 237 mL via ORAL
  Filled 2023-08-08 (×7): qty 237

## 2023-08-08 MED ORDER — ADULT MULTIVITAMIN W/MINERALS CH
1.0000 | ORAL_TABLET | Freq: Every day | ORAL | Status: DC
  Administered 2023-08-09 – 2023-08-14 (×6): 1 via ORAL
  Filled 2023-08-08 (×6): qty 1

## 2023-08-08 NOTE — Progress Notes (Signed)
 Inpatient Rehabilitation Care Coordinator Assessment and Plan Patient Details  Name: Mark [name] MRN: [id number] Date of Birth: 1945-11-07  Today's Date: 08/08/2023  Hospital Problems: Principal Problem:   Debility  Past Medical History:  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    Lupus anticoagulant disorder (HCC)    Lupus anticoagulant syndrome (HCC)    Multiple closed anterior-posterior compression fractures of pelvis (HCC)    Seizures (HCC)    last seizure 10 years ago approx    Sleep apnea    cpap broken x 1 year per wife    Stroke (HCC) 02/27/2011   Left side weakness   Past Surgical History:  Past Surgical History:  Procedure Laterality Date   blood clot Right    surgical removal   CHOLECYSTECTOMY     CORONARY ANGIOPLASTY  1992   CYSTOGRAM N/A 02/17/2020   Procedure: CYSTOGRAM;  Surgeon: Andrez Banker, MD;  Location: [facility];  Service: Urology;  Laterality: N/A;   CYSTOSCOPY WITH RETROGRADE PYELOGRAM, URETEROSCOPY AND STENT PLACEMENT Bilateral 02/17/2020   Procedure: Deliliah Fender  URETEROSCOPY AND STENT PLACEMENT;  Surgeon: Andrez Banker, MD;  Location: [facility];  Service: Urology;  Laterality: Bilateral;   SPLENECTOMY, TOTAL     vertebralplasty     Social History:  reports that he quit smoking about 38 years ago. His smoking use included cigarettes. He started smoking about 55 years ago. He has a 16.6 pack-year smoking history. He has never used smokeless tobacco. He reports that he does not currently use drugs. He reports that he does not drink alcohol .  Family / Support Systems     Social History Preferred language: English Religion: Protestant     Abuse/Neglect Abuse/Neglect Assessment Can Be Completed: Yes Physical Abuse: Denies Verbal Abuse: Denies Sexual Abuse: Denies Exploitation of patient/patient's resources: Denies Self-Neglect: Denies  Patient response to: Social Isolation - How often do you feel lonely or isolated from those around you?: Never  Emotional Status    Patient / Family Perceptions, Expectations & Goals    Building surveyor: None Premorbid Home Care/DME Agencies: None Transportation available at discharge: wife Is the patient able to respond to transportation needs?: Yes In the past 12 months, has lack of transportation kept you from medical appointments or from getting medications?: No In the past 12 months, has lack of transportation kept you from meetings, work, or from getting things needed for daily living?: No Resource referrals recommended: Neuropsychology  Discharge Planning Family / Support Systems Marital Status: Married Patient Roles: Spouse, Parent, Other (Comment) (retiree) Spouse/Significant Other: Mark [name] [phone] Children: Mark [name]-daughter [phone]  Has another daughter who lives in [place] comes yearly to see them Other Supports: Extended family-his sister and grandson Anticipated Caregiver: Wife daughter and grandson get into the car wife is not able too Ability/Limitations of Caregiver: Wife is disabled according to pt and does a lot but needs to be careful with her own health issues Caregiver Availability: 24/7 Family Dynamics: Close knit family who support one another and will be there. He reports they  are installing a ramp that will help him. He relies on them and he is very Academic librarian   Social History Preferred language: English Religion: Protestant Cultural Background: NA Education: Charity fundraiser - How often do you need to have someone help you when you read  instructions, pamphlets, or other written material from your doctor or pharmacy?: Never Writes: Yes Employment Status: Retired Marine scientist Issues: NA Guardian/Conservator: NA-according to MD pt is capable of making his own decisions while here.    Abuse/Neglect Abuse/Neglect Assessment Can Be Completed: Yes Physical Abuse: Denies Verbal Abuse: Denies Sexual Abuse: Denies Exploitation of patient/patient's resources: Denies Self-Neglect: Denies   Patient response to: Social Isolation - How often do you feel lonely or isolated from those around you?: Never   Emotional Status Pt's affect, behavior and adjustment status: Pt is motivated to recover and get back to his prior function prior to relapse. He has deficits from his stroke but wasd managing and using a rolling walker or wheelchair. 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 He ws doing well at home prior to this occuring. He talks with the MD's involved and feels he understands his treatment moving forward. Premorbid pt/family roles/activities: Husband, father, grandfather, retiree, sibling, friend, etc Anticipated changes in roles/activities/participation: resume Pt/family expectations/goals: Pt states: " I hope to do well and not burden my wife any more than I already do."   [employer]: Other (Comment) Premorbid Home Care/DME Agencies: Other (Comment) (Active with [facility] has wc, transport chair, bsc, tb seat, grab bars. Also having ramp installed) Transportation  available at discharge: wife or family member Is the patient able to respond to transportation needs?: Yes In the past 12 months, has lack of transportation kept you from medical appointments or from getting medications?: No In the past 12 months, has lack of transportation kept you from meetings, work, or from getting things needed for daily living?: No Resource referrals recommended: Neuropsychology   Discharge Planning Living Arrangements: Spouse/significant other Support Systems: Spouse/significant other, Children, Other relatives, Friends/neighbors Type of Residence: Private residence Insurance Resources: Media planner (specify) Radiographer, therapeutic) Financial Resources: Social Security, Family Support Financial Screen Referred: No Living Expenses: Own Money Management: Patient, Spouse Does the patient have any problems obtaining your medications?: No Home Management: wife Patient/Family Preliminary Plans: Return home with wife who does have her own health issues and can only do so much for pt. Their daughter and grandson assist when they can. Aware being evaluated today and goals being set for stay here. Care Coordinator Barriers to Discharge: Decreased caregiver support, Insurance for SNF coverage, Lack of/limited family support Care Coordinator Anticipated Follow Up Needs: HH/OP   Clinical Impression Pt familiar with CIR as previous patient with <30 days. He is pleasant. Wife remains primary caregiver. SW informed on ELOS. He is aware on family edu scheduled for Tuesday. SW will follow-up with updates.   Ayame Rena A Kimberly Coye 08/08/2023, 1:32 PM

## 2023-08-08 NOTE — Progress Notes (Signed)
 Physical Therapy Session Note  Patient Details  Name: Mark [name] MRN: [id number] Date of Birth: 1946-03-28  Today's Date: 08/08/2023 PT Individual Time: 0806-0902 PT Individual Time Calculation (min): 56 min   Short Term Goals: Week 1:  PT Short Term Goal 1 (Week 1): STG=LTG 2/2 ELOS  Skilled Therapeutic Interventions/Progress Updates: Patient supine in bed on entrance to room. Patient alert and agreeable to PT session.   Patient with no complaints of pain. MD arrived end of session and discussed possibility of using Botox to improve tone in L LE with pt agreeable to try next week. MD updated on pt's presentation during gait cycle, and that when R LE increases in step length, it increases tonicity on in plantarflexors/invertors, which has been ongoing. Pt encouraged to discuss with MD and Botox in the future with further emphasis on pros and cons.   Therapeutic Activity: Bed Mobility: Pt performed supine<sit on EOB with supervision and use of R HOB railing (pt reported having rail at home). Pt supervision to perform short sitting<>long sitting edge of mat during therex.  Transfers: Pt performed sit<>stand transfers throughout session with HW and with modA to stand from bed (first mobility in the morning) which progressed to minA during session, and CGA to pivot with VC to increase upright posture.  Gait Training:  Pt ambulated 70' using HW with CGA overall. Pt with L AFO donned. Pt with decreased cadence throughout. Pt with step-to pattern throughout with VC to increase step length on R LE slightly (not too far as to avoid plantarflexors/invertors increasing in tone). Pt required max cuing to improve step length. Pt also required cues throughout to maintain upright posture. Pt required seated rest break and reported fatigue following.   Therapeutic Exercise: Pt performed the following exercises with therapist providing the described cuing and facilitation for improvement. -  Contract-Relax of L plantarflexors long sitting on mat with 8lb ankle weight on L knee to further lengthen knee flexors (cue to press back of knee into mat) and gastrocnemius.   Patient sitting in WC at end of session with brakes locked, belt alarm set, and all needs within reach.      Therapy Documentation Precautions:  Precautions Precautions: Fall Recall of Precautions/Restrictions: Impaired Precaution/Restrictions Comments: L hemiplegia Required Braces or Orthoses: Other Brace Other Brace: L AFO Restrictions Weight Bearing Restrictions Per Provider Order: No   Therapy/Group: Individual Therapy  Iden Stripling PTA 08/08/2023, 12:07 PM

## 2023-08-08 NOTE — Progress Notes (Signed)
 Orthopedic Tech Progress Note Patient Details:  Mark [name] 07/25/1945 [id number]  Ortho Devices Type of Ortho Device: Prafo boot/shoe Ortho Device/Splint Location: LLE Ortho Device/Splint Interventions: Ordered   Post Interventions Patient Tolerated: Well  [name] 08/08/2023, 12:29 PM

## 2023-08-08 NOTE — Progress Notes (Addendum)
 Patient ID: Mark [name], male   DOB: 06/30/1945, 78 y.o.   MRN: [id number]  1022- SW left message for pt wife informing on ELOS. SW requested return phone call to schedule family edu on Monday or Tuesday.   *SW returned phone call to pt wife to confirm family edu on Tuesday 1pm-4pm.   Norval Been, MSW, LCSW Office: [phone] Cell: [phone] Fax: [phone]

## 2023-08-08 NOTE — Progress Notes (Addendum)
 Initial Nutrition Assessment  DOCUMENTATION CODES:   Not applicable  INTERVENTION:   Boost Plus PO once daily at bedtime, each supplement provides 360 kcal and 14 gm protein.  Nutritional services ambassador assisting patient with meal selections.   MVI with minerals daily.  NUTRITION DIAGNOSIS:   Increased nutrient needs related to other (see comment) (functional deficits d/t debility d/t urosepsis and AKI) as evidenced by estimated needs.  GOAL:   Patient will meet greater than or equal to 90% of their needs  MONITOR:   PO intake, Supplement acceptance  REASON FOR ASSESSMENT:   Consult Assessment of nutrition requirement/status  ASSESSMENT:   78 yo male admitted with functional deficits d/t debility secondary to urosepsis and AKI. PMH includes CAD, HTN, chronic ITP, stroke, seizures, dysphagia with pills.  Patient reports stable weight, good oral intake of meals PTA. Since admission to rehab, he has been consuming 100% of meals. He is pescatarian (vegetarian + fish). He has been able to choose the food items he would like for meals. Nutritional services ambassador is assisting patient with meal selections. Patient drinks Boost supplements at home ~once daily. He prefers chocolate flavor.   Labs reviewed.   Medications reviewed and include protonix , fibercon, florastor.  Weight history reviewed. Patient with no significant weight changes recently.  NUTRITION - FOCUSED PHYSICAL EXAM:  Flowsheet Row Most Recent Value  Orbital Region No depletion  Upper Arm Region No depletion  Thoracic and Lumbar Region No depletion  Buccal Region No depletion  Temple Region Mild depletion  Clavicle Bone Region No depletion  Clavicle and Acromion Bone Region No depletion  Scapular Bone Region No depletion  Dorsal Hand Moderate depletion  Patellar Region Unable to assess  Anterior Thigh Region Unable to assess  Posterior Calf Region Unable to assess  Edema (RD Assessment)  Unable to assess  [non pitting edema to BLE and LUE per RN assessment]  Hair Reviewed  Eyes Reviewed  Mouth Reviewed  Skin Reviewed  Nails Reviewed       Diet Order:   Diet Order             Diet vegetarian Room service appropriate? Yes; Fluid consistency: Thin  Diet effective now                   EDUCATION NEEDS:   No education needs have been identified at this time  Skin:  Skin Assessment: Reviewed RN Assessment  Last BM:  4/24 type 5  Height:   Ht Readings from Last 1 Encounters:  08/06/23 5\' 10"  (1.778 m)    Weight:   Wt Readings from Last 1 Encounters:  08/06/23 87 kg    Ideal Body Weight:  75.5 kg  BMI:  Body mass index is 27.52 kg/m.  Estimated Nutritional Needs:   Kcal:  2050-2250  Protein:  100-115 gm  Fluid:  >/= 2 L   Barnet Boots RD, LDN, CNSC Contact via secure chat. If unavailable, use group chat "RD Inpatient."

## 2023-08-08 NOTE — Plan of Care (Signed)
  Problem: Consults Goal: RH GENERAL PATIENT EDUCATION Description: See Patient Education module for education specifics. Outcome: Progressing   Problem: RH BOWEL ELIMINATION Goal: RH STG MANAGE BOWEL WITH ASSISTANCE Description: STG Manage Bowel with supervision Assistance. Outcome: Progressing   Problem: RH BLADDER ELIMINATION Goal: RH STG MANAGE BLADDER WITH ASSISTANCE Description: STG Manage Bladder With supervision Assistance Outcome: Progressing   Problem: RH SKIN INTEGRITY Goal: RH STG SKIN FREE OF INFECTION/BREAKDOWN Description: Manage skin free of infection/breakdown  with supervision assistance Outcome: Progressing   Problem: RH SAFETY Goal: RH STG ADHERE TO SAFETY PRECAUTIONS W/ASSISTANCE/DEVICE Description: STG Adhere to Safety Precautions With supervision Assistance/Device. Outcome: Progressing   Problem: RH PAIN MANAGEMENT Goal: RH STG PAIN MANAGED AT OR BELOW PT'S PAIN GOAL Description: <4 w/ prns Outcome: Progressing   Problem: RH KNOWLEDGE DEFICIT GENERAL Goal: RH STG INCREASE KNOWLEDGE OF SELF CARE AFTER HOSPITALIZATION Description: Manage increase knowledge of self care after hospitalization with supervision from wife/family using educational materials provided Outcome: Progressing

## 2023-08-08 NOTE — Progress Notes (Signed)
 Expiratory wheezing observed with intermittent urinary catheterization done by patient. However, improved after completion and repositioned no expiratory wheezing heard.

## 2023-08-08 NOTE — IPOC Note (Signed)
 Overall Plan of Care [facility]) Patient Details Name: Mark [name] MRN: [id number] DOB: 1946-01-31  Admitting Diagnosis: Debility  Hospital Problems: Principal Problem:   Debility     Functional Problem List: Nursing Bladder, Bowel, Edema, Endurance, Medication Management, Pain, Perception, Safety  PT Balance, Endurance, Motor, Sensory  OT Balance, Endurance, Motor, Safety, Sensory, Perception  SLP    TR         Basic ADL's: OT Grooming, Bathing, Dressing, Toileting     Advanced  ADL's: OT       Transfers: PT Bed Mobility, Bed to Chair, Car, Occupational psychologist, Research scientist (life sciences): PT Ambulation     Additional Impairments: OT None  SLP        TR      Anticipated Outcomes Item Anticipated Outcome  Self Feeding no goal, pt is set up only A  Swallowing      Basic self-care  supervision  Toileting  supervision   Bathroom Transfers supervision to toilet; CGA to tub/shower  Bowel/Bladder  continent of bowels/ manage bladder with pvrs/scheduled I and O caths(self caths)  Transfers  supervision  Locomotion  supervision  Communication     Cognition     Pain  <4 w/ prns  Safety/Judgment  manage safety with supervision   Therapy Plan: PT Intensity: Minimum of 1-2 x/day ,45 to 90 minutes PT Frequency: 5 out of 7 days PT Duration Estimated Length of Stay: 5-7 days OT Intensity: Minimum of 1-2 x/day, 45 to 90 minutes OT Frequency: 5 out of 7 days OT Duration/Estimated Length of Stay: 7 days     Team Interventions: Nursing Interventions Patient/Family Education, Bladder Management, Medication Management, Pain Management, Disease Management/Prevention, Discharge Planning  PT interventions Ambulation/gait training, Discharge planning, Functional mobility training, Psychosocial support, Therapeutic Activities, Visual/perceptual remediation/compensation, Balance/vestibular training, Disease management/prevention, Neuromuscular re-education, Skin  care/wound management, Therapeutic Exercise, Wheelchair propulsion/positioning, Cognitive remediation/compensation, DME/adaptive equipment instruction, Pain management, Splinting/orthotics, UE/LE Strength taining/ROM, Community reintegration, Development worker, international aid stimulation, Patient/family education, Museum/gallery curator, UE/LE Coordination activities  OT Interventions Warden/ranger, Discharge planning, DME/adaptive equipment instruction, Functional mobility training, Neuromuscular re-education, Disease mangement/prevention, Patient/family education, Psychosocial support, Self Care/advanced ADL retraining, Therapeutic Activities, Therapeutic Exercise, UE/LE Strength taining/ROM, UE/LE Coordination activities  SLP Interventions    TR Interventions    SW/CM Interventions Discharge Planning, Psychosocial Support, Patient/Family Education   Barriers to Discharge MD   Spasticity management   Nursing Decreased caregiver support, Neurogenic Bowel & Bladder Discharge: House  Home Access: Ramped entrance  Entrance Stairs-Number of Steps: 5 STE prefers to use steps not ramp  Entrance Stairs-Rails: Left, Right, Can reach both  Home Layout: One level  PT   L hemiparesis with hypertnia, decreased balance/coordination  OT      SLP      SW Decreased caregiver support, Lack of/limited family support, Community education officer for SNF coverage     Team Discharge Planning: Destination: PT-Home ,OT- Home , SLP-  Projected Follow-up: PT-Home health PT, OT-  None, SLP-  Projected Equipment Needs: PT- , OT- None recommended by OT, SLP-  Equipment Details: PT-has hemi walker, L PFRW, and WC, and ramp and L LE AFO, OT-  Patient/family involved in discharge planning: PT- Patient,  OT-Patient, SLP-   MD ELOS: 7-10d Medical Rehab Prognosis:  Good Assessment: The patient has been admitted for CIR therapies with the diagnosis of debility after urosepsis. The team will be addressing functional mobility, strength, stamina,  balance, safety, adaptive techniques and equipment, self-care, bowel and bladder  mgt, patient and caregiver education, self cath program, interventions for spasticity management . Goals have been set at Supervision. Anticipated discharge destination is Home with wife.        See Team Conference Notes for weekly updates to the plan of care

## 2023-08-08 NOTE — Progress Notes (Signed)
 PROGRESS NOTE   Subjective/Complaints:  WHO and PRAFO    Reviewed KUB   Pt on intermittent cath program at home and states he was able to do this even after CVA  Discussed tone with PTA plantar flexion and inversion tone LLE   ROS- no CP, SOB, N/V/D  Objective:   DG Abd Portable 1V Result Date: 08/07/2023 CLINICAL DATA:  Abdominal distention EXAM: PORTABLE ABDOMEN - 1 VIEW COMPARISON:  None Available. FINDINGS: Moderate amount of residual fecal material throughout the entire colon without obstruction. Fecal impaction rectum. IMPRESSION: Moderate amount of residual fecal material throughout the entire colon without obstruction. Fecal impaction rectum. Electronically Signed   By: Fredrich Jefferson M.D.   On: 08/07/2023 10:39   Recent Labs    08/06/23 0607 08/07/23 0606  WBC 11.8* 9.8  HGB 9.9* 10.3*  HCT 32.9* 32.5*  PLT 375 421*   Recent Labs    08/06/23 0607 08/07/23 0606  NA 137 137  K 3.6 4.0  CL 108 107  CO2 21* 19*  GLUCOSE 100* 103*  BUN 28* 23  CREATININE 1.27* 1.24  CALCIUM  8.6* 8.7*    Intake/Output Summary (Last 24 hours) at 08/08/2023 1040 Last data filed at 08/08/2023 0900 Gross per 24 hour  Intake 358 ml  Output 2047 ml  Net -1689 ml        Physical Exam: Vital Signs Blood pressure 118/66, pulse 65, temperature 99 F (37.2 C), temperature source Oral, resp. rate 16, height 5\' 10"  (1.778 m), weight 87 kg, SpO2 93%.   General: No acute distress Mood and affect are appropriate Heart: Regular rate and rhythm no rubs murmurs or extra sounds Lungs: Clear to auscultation, breathing unlabored, no rales or wheezes Abdomen: Positive bowel sounds, soft nontender to palpation, +distended, + tympany Extremities: No clubbing, cyanosis, or edema Skin: No evidence of breakdown, no evidence of rash Neurologic: oriented to person and place not time (off by 1 d)  motor strength is 5/5 in right and  trace  left deltoid, bicep, tricep, grip, hip flexor, knee extensors, ankle dorsiflexor and plantar flexor Tone increased left hand lumbricals and wrist flexors as well as Left foot plantar flexors and invertors Sensory exam normal sensation to light touch and proprioception in bilateral upper and lower extremities Cerebellar exam unable to perform on left due to weakness Musculoskeletal: Reduced ROM Left shoulder and hip with tone influence   Assessment/Plan: 1. Functional deficits which require 3+ hours per day of interdisciplinary therapy in a comprehensive inpatient rehab setting. Physiatrist is providing close team supervision and 24 hour management of active medical problems listed below. Physiatrist and rehab team continue to assess barriers to discharge/monitor patient progress toward functional and medical goals  Care Tool:  Bathing    Body parts bathed by patient: Chest, Abdomen, Front perineal area, Right upper leg, Left upper leg, Right lower leg, Left lower leg, Face, Left arm, Buttocks   Body parts bathed by helper: Right arm     Bathing assist Assist Level: Minimal Assistance - Patient > 75%     Upper Body Dressing/Undressing Upper body dressing   What is the patient wearing?: Pull over shirt  Upper body assist Assist Level: Minimal Assistance - Patient > 75%    Lower Body Dressing/Undressing Lower body dressing      What is the patient wearing?: Underwear/pull up, Pants     Lower body assist Assist for lower body dressing: Moderate Assistance - Patient 50 - 74%     Toileting Toileting    Toileting assist Assist for toileting: Minimal Assistance - Patient > 75%     Transfers Chair/bed transfer  Transfers assist  Chair/bed transfer activity did not occur: Safety/medical concerns  Chair/bed transfer assist level: Minimal Assistance - Patient > 75%     Locomotion Ambulation   Ambulation assist      Assist level: Contact Guard/Touching  assist Assistive device: Walker-hemi Max distance: 70 feet   Walk 10 feet activity   Assist     Assist level: Contact Guard/Touching assist Assistive device: Walker-hemi   Walk 50 feet activity   Assist    Assist level: Contact Guard/Touching assist Assistive device: Walker-hemi    Walk 150 feet activity   Assist Walk 150 feet activity did not occur: Safety/medical concerns (fatigue/SOB)         Walk 10 feet on uneven surface  activity   Assist Walk 10 feet on uneven surfaces activity did not occur: Safety/medical concerns (fatigue)         Wheelchair     Assist Is the patient using a wheelchair?: Yes Type of Wheelchair: Manual    Wheelchair assist level: Set up assist, Supervision/Verbal cueing Max wheelchair distance: 166ft    Wheelchair 50 feet with 2 turns activity    Assist        Assist Level: Supervision/Verbal cueing   Wheelchair 150 feet activity     Assist      Assist Level: Supervision/Verbal cueing   Blood pressure 118/66, pulse 65, temperature 99 F (37.2 C), temperature source Oral, resp. rate 16, height 5\' 10"  (1.778 m), weight 87 kg, SpO2 93%.  Medical Problem List and Plan: 1. Functional deficits secondary to debility 2/2 urosepsis and AKI             -patient may shower             -ELOS/Goals: 7 days S             Admit to CIR   2.  Lupus anticoagulant/Antithrombotics: -DVT/anticoagulation:  Pharmaceutical: Xarelto              -antiplatelet therapy: ASA resumed.   3. Pain Management: tylenol  prn   4. Mood/Behavior/Sleep: LCSW to evaluate and provide support prn.              -antipsychotic agents: N/A             --melatonin prn for insomnia.    5. Neuropsych/cognition: This patient is capable of making decisions on his own behalf.   6. Skin/Wound Care: routine pressure relief measures.    7. Fluids/Electrolytes/Nutrition: Monitor I/O. Check CMET in am.     Latest Ref Rng & Units 08/07/2023    6:06  AM 08/06/2023    6:07 AM 08/05/2023    6:05 AM  BMP  Glucose 70 - 99 mg/dL 161  096  045   BUN 8 - 23 mg/dL 23  28  31    Creatinine 0.61 - 1.24 mg/dL 4.09  8.11  9.14   Sodium 135 - 145 mmol/L 137  137  134   Potassium 3.5 - 5.1 mmol/L 4.0  3.6  3.7  Chloride 98 - 111 mmol/L 107  108  105   CO2 22 - 32 mmol/L 19  21  22    Calcium  8.9 - 10.3 mg/dL 8.7  8.6  8.3       8. Urosepsis/E coli UTI: Treated with antibiotics thru 04/20   9. Acute renal failure: resolving. Continue to monitor   10. HTN: Monitor BP TID Vitals:   08/07/23 1955 08/08/23 0452  BP: 104/62 118/66  Pulse: 72 65  Resp: 18 16  Temp: 99.1 F (37.3 C) 99 F (37.2 C)  SpO2: 94% 93%      11. Chronic intermittent diarrhea: continue probiotics. Add fiber   12. Neurogenic bladder: Discontinue foley --Resume I/O caths 4-6 hours to keep bladder volumes < 350 cc Seen by Dr Dulcy Gibney as OP , urinary retention, cont I/O caths   last visit in November  13. Leucocytosis: Resolving from 23.9--> 11.8.    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    Latest Ref Rng & Units 08/07/2023    6:06 AM 08/06/2023    6:07 AM 08/05/2023    6:05 AM  CBC  WBC 4.0 - 10.5 K/uL 9.8  11.8  14.1   Hemoglobin 13.0 - 17.0 g/dL 16.1  9.9  09.6   Hematocrit 39.0 - 52.0 % 32.5  32.9  33.8   Platelets 150 - 400 K/uL 421  375  332       15. Severe Neuropathy BLE w/gait d/o: scheduled for outpatient Qutenza-has  sensation at ankle on right but not left, no LT sensation at feet    16. H/o stroke with Left spastic HP: Continue low dose ASA, Xarelto  and baclofen .  Needs resting hand splint and PRAFO left side, thinks he has these at home , will need wife to bring in  [facility] in room, pt states he does not have PRAFO will order  May benefit from Xeomin injection 400U    17. Seizure d/o: Stable on Keppra  1500 mg BID   18. H/o depression/anxiety: continue Sertraline  and  Bupropion .     19.  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

## 2023-08-08 NOTE — Progress Notes (Signed)
 Occupational Therapy Session Note  Patient Details  Name: Mark [name] MRN: [id number] Date of Birth: 03/07/1946  Today's Date: 08/08/2023 OT Individual Time: 0945-1100 OT Individual Time Calculation (min): 75 min    Short Term Goals: Week 1:  OT Short Term Goal 1 (Week 1): STGs = LTGs  Skilled Therapeutic Interventions/Progress Updates:     Pt received in w/c ready for therapy.  Focus of therapy session on hemibathing and dressing strategies along with activity tolerance. To prep for shower therapist had to gather supplies.  During this time, pt worked on self ROM of L elbow.      ADL Retraining: -toileting  -doffed LB clothing over hips with CGA for balance and over feet in sitting with supervision using his feet to assist  -shower  Used long sponge to reach under R arm and to feet  -donned clothing from w/c (see ADL documentation)   Therapeutic Activity/ Exercise: - self ROM stretching by pt to L fingers with cues for safe technique  Transfers: -sit to stand from wc to hemiwalker CGA -ambulation to toilet Min A with HW -sit to stand from toilet CGA with use of bar  Balance:  -pt stands with CGA as he uses dominant hand to pull up pants but needs cues to stand in front of a solid surface in case of a loss of balance.    Pt does need extra time to complete tasks due to low activity tolerance.     Pt resting in w/c with all needs met. Alarm set and call light in reach.     Therapy Documentation Precautions:  Precautions Precautions: Fall Recall of Precautions/Restrictions: Impaired Precaution/Restrictions Comments: L hemiplegia Required Braces or Orthoses: Other Brace Other Brace: L AFO Restrictions Weight Bearing Restrictions Per Provider Order: No       Pain: Pain Assessment Pain Score: 0-No pain ADL: ADL Eating: Set up Grooming: Supervision/safety Upper Body Bathing: Supervision/safety (used long sponge to wash under R arm and R shoulder) Lower Body  Bathing:  (bathes bottom with lateral leans) Upper Body Dressing: Supervision/safety, Contact guard (min CGA to pull shirt down lower part of back.) Where Assessed-Upper Body Dressing: Chair Lower Body Dressing: Minimal assistance, Moderate assistance (able to pull underwear and pants over feet with supervision, min A to pull fully over L hip) Where Assessed-Lower Body Dressing: Chair Toileting: Minimal assistance Toilet Transfer: Minimal assistance Toilet Transfer Method: Ambulating Tub/Shower Transfer: Not assessed Film/video editor: Minimal assistance Film/video editor Method: Designer, industrial/product: Emergency planning/management officer, Other (comment)   Therapy/Group: Individual Therapy  Jalyne Brodzinski 08/08/2023, 11:36 AM

## 2023-08-08 NOTE — Care Management (Signed)
 [facility]patient [facility] Individual Statement of Services  Patient Name:  Mark [name]  Date:  08/08/2023  Welcome to the [facility]patient [facility].  Our goal is to provide you with an individualized program based on your diagnosis and situation, designed to meet your specific needs.  With this comprehensive rehabilitation program, you will be expected to participate in at least 3 hours of rehabilitation therapies Monday-Friday, with modified therapy programming on the weekends.  Your rehabilitation program will include the following services:  Physical Therapy (PT), Occupational Therapy (OT), 24 hour per day rehabilitation nursing, Therapeutic Recreaction (TR), Psychology, Neuropsychology, Care Coordinator, Rehabilitation Medicine, Nutrition Services, Pharmacy Services, and Other  Weekly team conferences will be held on Wednesdays to discuss your progress.  Your Inpatient Rehabilitation Care Coordinator will talk with you frequently to get your input and to update you on team discussions.  Team conferences with you and your family in attendance may also be held.  Expected length of stay: 5-7 days    Overall anticipated outcome: Supervision  Depending on your progress and recovery, your program may change. Your Inpatient Rehabilitation Care Coordinator will coordinate services and will keep you informed of any changes. Your Inpatient Rehabilitation Care Coordinator's name and contact numbers are listed  below.  The following services may also be recommended but are not provided by the Inpatient [facility]:  Driving Evaluations Home Health Rehabiltiation Services Outpatient Rehabilitation Services Vocational Rehabilitation   Arrangements will be made to provide these services after discharge if needed.  Arrangements include referral to agencies that provide these services.  Your insurance has been verified to be:  [employer]  Your primary doctor is:  Victorio Grave  Pertinent information will be shared with your doctor and your insurance company.  Inpatient Rehabilitation Care Coordinator:  Kathey Pang [phone] or ([phone]  Information discussed with and copy given to patient by: Rennis Case, 08/08/2023, 9:37 AM

## 2023-08-08 NOTE — Progress Notes (Signed)
 Physical Therapy Session Note  Patient Details  Name: Mark [name] MRN: [id number] Date of Birth: February 23, 1946  Today's Date: 08/08/2023 PT Individual Time: 1300-1400 PT Individual Time Calculation (min): 60 min   Short Term Goals: Week 1:  PT Short Term Goal 1 (Week 1): STG=LTG 2/2 ELOS  Skilled Therapeutic Interventions/Progress Updates: Pt presents sitting in recliner and agreeable to therapy.  Pt tolerated L HC stretching prior to donning L AFO and shoe.  Pt donned R shoe w/ set-up.  Pt transfers sit to stand w/ CGA and occasional cues for LLE placement.  Pt amb multiple trials w/ HW and CGA.  Pt required cueing for increasing RLE step length, but greater cues for proper placement of HW.  Pt continues to place HW in front of feet and requires verbal and manual A for placement.  Pt amb x 90' x 3, 65' x 2 requiring seated rest breaks.  Pt had no episodes of L heel coming up out of AFO/shoe.  Pt amb through cone obstacle course, initially given visual demonstration for placement and advancement of HW w/ 10% carryover.  Pt states aware of proper placement, but unable to perform.  Pt returned to room and remained sitting in recliner w/ chair alarm on and all needs in reach.     Therapy Documentation Precautions:  Precautions Precautions: Fall Recall of Precautions/Restrictions: Impaired Precaution/Restrictions Comments: L hemiplegia Required Braces or Orthoses: Other Brace Other Brace: L AFO Restrictions Weight Bearing Restrictions Per Provider Order: No General:   Vital Signs:   Pain:0/10 Pain Assessment Pain Score: 0-No pain    Therapy/Group: Individual Therapy  Carmelite Violet P Sarahgrace Broman 08/08/2023, 2:03 PM

## 2023-08-08 NOTE — Plan of Care (Signed)
  Problem: Consults Goal: RH GENERAL PATIENT EDUCATION Description: See Patient Education module for education specifics. Outcome: Progressing   Problem: RH BOWEL ELIMINATION Goal: RH STG MANAGE BOWEL WITH ASSISTANCE Description: STG Manage Bowel with supervision Assistance. Outcome: Progressing   Problem: RH BLADDER ELIMINATION Goal: RH STG MANAGE BLADDER WITH ASSISTANCE Description: STG Manage Bladder With supervision Assistance Outcome: Progressing   Problem: RH SAFETY Goal: RH STG ADHERE TO SAFETY PRECAUTIONS W/ASSISTANCE/DEVICE Description: STG Adhere to Safety Precautions With supervision Assistance/Device. Outcome: Progressing   Problem: RH PAIN MANAGEMENT Goal: RH STG PAIN MANAGED AT OR BELOW PT'S PAIN GOAL Description: <4 w/ prns Outcome: Progressing   Problem: RH KNOWLEDGE DEFICIT GENERAL Goal: RH STG INCREASE KNOWLEDGE OF SELF CARE AFTER HOSPITALIZATION Description: Manage increase knowledge of self care after hospitalization with supervision from wife/family using educational materials provided Outcome: Progressing

## 2023-08-08 NOTE — Progress Notes (Signed)
 PROGRESS NOTE   Subjective/Complaints:  WHO and PRAFO    Reviewed KUB   Pt on intermittent cath program at home and states he was able to do this even after CVA  Discussed tone with PTA plantar flexion and inversion tone LLE   ROS- no CP, SOB, N/V/D  Objective:   DG Abd Portable 1V Result Date: 08/07/2023 CLINICAL DATA:  Abdominal distention EXAM: PORTABLE ABDOMEN - 1 VIEW COMPARISON:  None Available. FINDINGS: Moderate amount of residual fecal material throughout the entire colon without obstruction. Fecal impaction rectum. IMPRESSION: Moderate amount of residual fecal material throughout the entire colon without obstruction. Fecal impaction rectum. Electronically Signed   By: Fredrich Jefferson M.D.   On: 08/07/2023 10:39   Recent Labs    08/06/23 0607 08/07/23 0606  WBC 11.8* 9.8  HGB 9.9* 10.3*  HCT 32.9* 32.5*  PLT 375 421*   Recent Labs    08/06/23 0607 08/07/23 0606  NA 137 137  K 3.6 4.0  CL 108 107  CO2 21* 19*  GLUCOSE 100* 103*  BUN 28* 23  CREATININE 1.27* 1.24  CALCIUM  8.6* 8.7*    Intake/Output Summary (Last 24 hours) at 08/08/2023 0817 Last data filed at 08/08/2023 0011 Gross per 24 hour  Intake 118 ml  Output 2047 ml  Net -1929 ml        Physical Exam: Vital Signs Blood pressure 118/66, pulse 65, temperature 99 F (37.2 C), temperature source Oral, resp. rate 16, height 5\' 10"  (1.778 m), weight 87 kg, SpO2 93%.   General: No acute distress Mood and affect are appropriate Heart: Regular rate and rhythm no rubs murmurs or extra sounds Lungs: Clear to auscultation, breathing unlabored, no rales or wheezes Abdomen: Positive bowel sounds, soft nontender to palpation, +distended, + tympany Extremities: No clubbing, cyanosis, or edema Skin: No evidence of breakdown, no evidence of rash Neurologic: oriented to person and place not time (off by 1 d)  motor strength is 5/5 in right and  trace  left deltoid, bicep, tricep, grip, hip flexor, knee extensors, ankle dorsiflexor and plantar flexor Tone increased left hand lumbricals and wrist flexors as well as Left foot plantar flexors and invertors Sensory exam normal sensation to light touch and proprioception in bilateral upper and lower extremities Cerebellar exam unable to perform on left due to weakness Musculoskeletal: Reduced ROM Left shoulder and hip with tone influence   Assessment/Plan: 1. Functional deficits which require 3+ hours per day of interdisciplinary therapy in a comprehensive inpatient rehab setting. Physiatrist is providing close team supervision and 24 hour management of active medical problems listed below. Physiatrist and rehab team continue to assess barriers to discharge/monitor patient progress toward functional and medical goals  Care Tool:  Bathing    Body parts bathed by patient: Chest, Abdomen, Front perineal area, Right upper leg, Left upper leg, Right lower leg, Left lower leg, Face, Left arm, Buttocks   Body parts bathed by helper: Right arm     Bathing assist Assist Level: Minimal Assistance - Patient > 75%     Upper Body Dressing/Undressing Upper body dressing   What is the patient wearing?: Pull over shirt  Upper body assist Assist Level: Minimal Assistance - Patient > 75%    Lower Body Dressing/Undressing Lower body dressing      What is the patient wearing?: Underwear/pull up, Pants     Lower body assist Assist for lower body dressing: Moderate Assistance - Patient 50 - 74%     Toileting Toileting    Toileting assist Assist for toileting: Minimal Assistance - Patient > 75%     Transfers Chair/bed transfer  Transfers assist  Chair/bed transfer activity did not occur: Safety/medical concerns  Chair/bed transfer assist level: Minimal Assistance - Patient > 75%     Locomotion Ambulation   Ambulation assist      Assist level: Contact Guard/Touching  assist Assistive device: Walker-hemi Max distance: 70 feet   Walk 10 feet activity   Assist     Assist level: Contact Guard/Touching assist Assistive device: Walker-hemi   Walk 50 feet activity   Assist    Assist level: Contact Guard/Touching assist Assistive device: Walker-hemi    Walk 150 feet activity   Assist Walk 150 feet activity did not occur: Safety/medical concerns (fatigue/SOB)         Walk 10 feet on uneven surface  activity   Assist Walk 10 feet on uneven surfaces activity did not occur: Safety/medical concerns (fatigue)         Wheelchair     Assist Is the patient using a wheelchair?: Yes Type of Wheelchair: Manual    Wheelchair assist level: Set up assist, Supervision/Verbal cueing Max wheelchair distance: 151ft    Wheelchair 50 feet with 2 turns activity    Assist        Assist Level: Supervision/Verbal cueing   Wheelchair 150 feet activity     Assist      Assist Level: Supervision/Verbal cueing   Blood pressure 118/66, pulse 65, temperature 99 F (37.2 C), temperature source Oral, resp. rate 16, height 5\' 10"  (1.778 m), weight 87 kg, SpO2 93%.  Medical Problem List and Plan: 1. Functional deficits secondary to debility 2/2 urosepsis and AKI             -patient may shower             -ELOS/Goals: 7 days S             Admit to CIR   2.  Lupus anticoagulant/Antithrombotics: -DVT/anticoagulation:  Pharmaceutical: Xarelto              -antiplatelet therapy: ASA resumed.   3. Pain Management: tylenol  prn   4. Mood/Behavior/Sleep: LCSW to evaluate and provide support prn.              -antipsychotic agents: N/A             --melatonin prn for insomnia.    5. Neuropsych/cognition: This patient is capable of making decisions on his own behalf.   6. Skin/Wound Care: routine pressure relief measures.    7. Fluids/Electrolytes/Nutrition: Monitor I/O. Check CMET in am.     Latest Ref Rng & Units 08/07/2023    6:06  AM 08/06/2023    6:07 AM 08/05/2023    6:05 AM  BMP  Glucose 70 - 99 mg/dL 161  096  045   BUN 8 - 23 mg/dL 23  28  31    Creatinine 0.61 - 1.24 mg/dL 4.09  8.11  9.14   Sodium 135 - 145 mmol/L 137  137  134   Potassium 3.5 - 5.1 mmol/L 4.0  3.6  3.7  Chloride 98 - 111 mmol/L 107  108  105   CO2 22 - 32 mmol/L 19  21  22    Calcium  8.9 - 10.3 mg/dL 8.7  8.6  8.3       8. Urosepsis/E coli UTI: Treated with antibiotics thru 04/20   9. Acute renal failure: resolving. Continue to monitor   10. HTN: Monitor BP TID Vitals:   08/07/23 1955 08/08/23 0452  BP: 104/62 118/66  Pulse: 72 65  Resp: 18 16  Temp: 99.1 F (37.3 C) 99 F (37.2 C)  SpO2: 94% 93%      11. Chronic intermittent diarrhea: continue probiotics. Add fiber   12. Neurogenic bladder: Discontinue foley --Resume I/O caths 4-6 hours to keep bladder volumes < 350 cc Seen by Dr Dulcy Gibney as OP , urinary retention, cont I/O caths   last visit in November  13. Leucocytosis: Resolving from 23.9--> 11.8.    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    Latest Ref Rng & Units 08/07/2023    6:06 AM 08/06/2023    6:07 AM 08/05/2023    6:05 AM  CBC  WBC 4.0 - 10.5 K/uL 9.8  11.8  14.1   Hemoglobin 13.0 - 17.0 g/dL 69.6  9.9  29.5   Hematocrit 39.0 - 52.0 % 32.5  32.9  33.8   Platelets 150 - 400 K/uL 421  375  332       15. Severe Neuropathy BLE w/gait d/o: scheduled for outpatient Qutenza-has  sensation at ankle on right but not left, no LT sensation at feet    16. H/o stroke with Left spastic HP: Continue low dose ASA, Xarelto  and baclofen .  Needs resting hand splint and PRAFO left side, thinks he has these at home , will need wife to bring in  [facility] in room, pt states he does not have PRAFO will order  May benefit from Xeomin injection 400U    17. Seizure d/o: Stable on Keppra  1500 mg BID   18. H/o depression/anxiety: continue Sertraline  and  Bupropion .     19.  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

## 2023-08-09 MED ORDER — POLYETHYLENE GLYCOL 3350 17 G PO PACK
17.0000 g | PACK | Freq: Two times a day (BID) | ORAL | Status: DC
  Administered 2023-08-09 – 2023-08-12 (×6): 17 g via ORAL
  Filled 2023-08-09 (×11): qty 1

## 2023-08-09 NOTE — Progress Notes (Signed)
 Occupational Therapy Session Note  Patient Details  Name: Mark [name] MRN: [id number] Date of Birth: [birth year]  Today's Date: 08/09/2023 OT Individual Time: [phone] OT Individual Time Calculation (min): 75 min    Short Term Goals: Week 1:  OT Short Term Goal 1 (Week 1): STGs = LTGs  Skilled Therapeutic Interventions/Progress Updates:    Patient resting in bed at the time of arrival, patient indicated that he was able to rest during the night.  The pt reported low back and groining pain of a 8 on 0-10 scale with nursing providing medication to address his symptoms.  The pt in agreement with completing a BADL related task at sink LOF.  The patient was able to come from supine in bed to EOB with CGA.  The pt was able to come from sit to stand with  MinA using the hemi cane.  The pt was able to transfer to the w/c with MinA.  The pt was transported to the sink and was able to doff his over head shirt with SBA.  The pt was able to bathe his upper body inclusive of his face, chest, arms and midriff with s/u A , was MinA for washing under his arms using the long handle sponge.   The pt was able to come from sit to stand with CGA using the sink. The pt was s/uA for washing his private area  in while seated and he was Dep for his bottom.  The pt was MinA for applying deodorant and donning his over head shirt. The pt was ModA for donning his LB items, a brief and pants using the reacher. The pt was Dep for donning his socks. The pt was was able to brush his teeth and comb his hair at sink LOF with s/uA.  The pt remained at w/c LOF with his L arm on the hemi table and nursing present to address all additional needs. Therapy Documentation Precautions:  Precautions Precautions: Fall Recall of Precautions/Restrictions: Impaired Precaution/Restrictions Comments: L hemiplegia Required Braces or Orthoses: Other Brace Other Brace: L AFO Restrictions Weight Bearing Restrictions Per Provider Order:  No  Therapy/Group: Individual Therapy  Moises Ang 08/09/2023, 4:33 PM

## 2023-08-09 NOTE — Progress Notes (Signed)
 PROGRESS NOTE   Subjective/Complaints:   Remains constipated   ROS- no CP, SOB, N/V/D  Objective:   DG Abd Portable 1V Result Date: 08/07/2023 CLINICAL DATA:  Abdominal distention EXAM: PORTABLE ABDOMEN - 1 VIEW COMPARISON:  None Available. FINDINGS: Moderate amount of residual fecal material throughout the entire colon without obstruction. Fecal impaction rectum. IMPRESSION: Moderate amount of residual fecal material throughout the entire colon without obstruction. Fecal impaction rectum. Electronically Signed   By: Fredrich Jefferson M.D.   On: 08/07/2023 10:39   Recent Labs    08/07/23 0606  WBC 9.8  HGB 10.3*  HCT 32.5*  PLT 421*   Recent Labs    08/07/23 0606  NA 137  K 4.0  CL 107  CO2 19*  GLUCOSE 103*  BUN 23  CREATININE 1.24  CALCIUM  8.7*    Intake/Output Summary (Last 24 hours) at 08/09/2023 0856 Last data filed at 08/09/2023 0245 Gross per 24 hour  Intake 1432 ml  Output 3268 ml  Net -1836 ml        Physical Exam: Vital Signs Blood pressure 121/67, pulse 67, temperature 98 F (36.7 C), resp. rate 18, height 5\' 10"  (1.778 m), weight 87 kg, SpO2 94%.   General: No acute distress Mood and affect are appropriate Heart: Regular rate and rhythm no rubs murmurs or extra sounds Lungs: Clear to auscultation, breathing unlabored, no rales or wheezes Abdomen: Positive bowel sounds, soft nontender to palpation, +distended, + tympany Extremities: No clubbing, cyanosis, or edema Skin: No evidence of breakdown, no evidence of rash  motor strength is 5/5 in right and  trace left deltoid, bicep, tricep, grip, hip flexor, knee extensors, ankle dorsiflexor and plantar flexor Tone increased left hand lumbricals and wrist flexors as well as Left foot plantar flexors and invertors Sensory exam normal sensation to light touch and proprioception in bilateral upper and lower extremities Cerebellar exam unable to perform  on left due to weakness Musculoskeletal: Reduced ROM Left shoulder and hip with tone influence   Assessment/Plan: 1. Functional deficits which require 3+ hours per day of interdisciplinary therapy in a comprehensive inpatient rehab setting. Physiatrist is providing close team supervision and 24 hour management of active medical problems listed below. Physiatrist and rehab team continue to assess barriers to discharge/monitor patient progress toward functional and medical goals  Care Tool:  Bathing    Body parts bathed by patient: Chest, Abdomen, Front perineal area, Right upper leg, Left upper leg, Right lower leg, Left lower leg, Face, Left arm, Buttocks   Body parts bathed by helper: Right arm     Bathing assist Assist Level: Minimal Assistance - Patient > 75%     Upper Body Dressing/Undressing Upper body dressing   What is the patient wearing?: Pull over shirt    Upper body assist Assist Level: Minimal Assistance - Patient > 75%    Lower Body Dressing/Undressing Lower body dressing      What is the patient wearing?: Underwear/pull up, Pants     Lower body assist Assist for lower body dressing: Moderate Assistance - Patient 50 - 74%     Toileting Toileting    Toileting assist Assist for toileting: Minimal  Assistance - Patient > 75%     Transfers Chair/bed transfer  Transfers assist  Chair/bed transfer activity did not occur: Safety/medical concerns  Chair/bed transfer assist level: Minimal Assistance - Patient > 75%     Locomotion Ambulation   Ambulation assist      Assist level: Contact Guard/Touching assist Assistive device: Walker-hemi Max distance: 90   Walk 10 feet activity   Assist     Assist level: Contact Guard/Touching assist Assistive device: Walker-hemi   Walk 50 feet activity   Assist    Assist level: Contact Guard/Touching assist Assistive device: Walker-hemi    Walk 150 feet activity   Assist Walk 150 feet activity  did not occur: Safety/medical concerns (fatigue/SOB)         Walk 10 feet on uneven surface  activity   Assist Walk 10 feet on uneven surfaces activity did not occur: Safety/medical concerns (fatigue)         Wheelchair     Assist Is the patient using a wheelchair?: Yes Type of Wheelchair: Manual    Wheelchair assist level: Set up assist, Supervision/Verbal cueing Max wheelchair distance: 169ft    Wheelchair 50 feet with 2 turns activity    Assist        Assist Level: Supervision/Verbal cueing   Wheelchair 150 feet activity     Assist      Assist Level: Supervision/Verbal cueing   Blood pressure 121/67, pulse 67, temperature 98 F (36.7 C), resp. rate 18, height 5\' 10"  (1.778 m), weight 87 kg, SpO2 94%.  Medical Problem List and Plan: 1. Functional deficits secondary to debility 2/2 urosepsis and AKI             -patient may shower             -ELOS/Goals: 7 days S             Admit to CIR   2.  Lupus anticoagulant/Antithrombotics: -DVT/anticoagulation:  Pharmaceutical: Xarelto              -antiplatelet therapy: ASA resumed.   3. Pain Management: tylenol  prn   4. Mood/Behavior/Sleep: LCSW to evaluate and provide support prn.              -antipsychotic agents: N/A             --melatonin prn for insomnia.    5. Neuropsych/cognition: This patient is capable of making decisions on his own behalf.   6. Skin/Wound Care: routine pressure relief measures.    7. Fluids/Electrolytes/Nutrition: Monitor I/O. Check CMET in am.     Latest Ref Rng & Units 08/07/2023    6:06 AM 08/06/2023    6:07 AM 08/05/2023    6:05 AM  BMP  Glucose 70 - 99 mg/dL 161  096  045   BUN 8 - 23 mg/dL 23  28  31    Creatinine 0.61 - 1.24 mg/dL 4.09  8.11  9.14   Sodium 135 - 145 mmol/L 137  137  134   Potassium 3.5 - 5.1 mmol/L 4.0  3.6  3.7   Chloride 98 - 111 mmol/L 107  108  105   CO2 22 - 32 mmol/L 19  21  22    Calcium  8.9 - 10.3 mg/dL 8.7  8.6  8.3       8.  Urosepsis/E coli UTI: Treated with antibiotics thru 04/20   9. Acute renal failure: resolving. Continue to monitor   10. HTN: Monitor BP TID Vitals:   08/08/23  1936 08/09/23 0616  BP: 117/66 121/67  Pulse: 76 67  Resp: 18 18  Temp: 98.2 F (36.8 C) 98 F (36.7 C)  SpO2: 96% 94%      11. Chronic intermittent diarrhea: continue probiotics. Add fiber   12. Neurogenic bladder: Discontinue foley --Resume I/O caths 4-6 hours to keep bladder volumes < 350 cc Seen by Dr Dulcy Gibney as OP , urinary retention, cont I/O caths   last visit in November - no role for flomax  per Uro  13. Leucocytosis: Resolving from 23.9--> 11.8.    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    Latest Ref Rng & Units 08/07/2023    6:06 AM 08/06/2023    6:07 AM 08/05/2023    6:05 AM  CBC  WBC 4.0 - 10.5 K/uL 9.8  11.8  14.1   Hemoglobin 13.0 - 17.0 g/dL 40.9  9.9  81.1   Hematocrit 39.0 - 52.0 % 32.5  32.9  33.8   Platelets 150 - 400 K/uL 421  375  332       15. Severe Neuropathy BLE w/gait d/o: scheduled for outpatient Qutenza-has  sensation at ankle on right but not left, no LT sensation at feet    16. H/o stroke with Left spastic HP: Continue low dose ASA, Xarelto  and baclofen .  Needs resting hand splint and PRAFO left side, thinks he has these at home , will need wife to bring in  [facility] in room, pt states he does not have PRAFO will order  May benefit from Xeomin injection 400U - can do as OP    17. Seizure d/o: Stable on Keppra  1500 mg BID   18. H/o depression/anxiety: continue Sertraline  and Bupropion .     19.  Abd distention - + stool and gas on KUB but no obstructive signs  Incidental finding of T12 and T11 vertebroplasty ( chart says from 2008)  BM 4/24 will change miralax  to BID and use dulc supp today   LOS: 3 days A FACE TO FACE EVALUATION WAS PERFORMED  Genetta Kenning 08/09/2023, 8:56 AM

## 2023-08-09 NOTE — Progress Notes (Signed)
 Physical Therapy Session Note  Patient Details  Name: Mark [name] MRN: [id number] Date of Birth: 09-Sep-1945  Today's Date: 08/09/2023 PT Individual Time: [id number]; 1308 - 1403 PT Individual Time Calculation (min): 61 min; 55 min   Short Term Goals: Week 1:  PT Short Term Goal 1 (Week 1): STG=LTG 2/2 ELOS  SESSION 1 Skilled Therapeutic Interventions/Progress Updates: Patient sitting in WC on entrance to room. Patient alert and agreeable to PT session.   Patient reported no pain during session.  Discussed pt's tone/tightness in L knee flexors and plantarflexors that prevents pt from achieving full upright posture in extension, which causes poor mechanics throughout the body (pt reported having history of low back pain - not currently present). PTA encouraged pt to advocate for himself after d/c to see if Botox will help with decreasing tone in L LE with PT follow-up (MD informed). PTA further discussed that since pt has had this chronic presentation since previous stroke around 12 years ago, that it is unknown from this PTA how much it might help, but that it would be worth assessing in order to hopefully progress from [facility] as this AD promotes weight shift to non-paretic side, and that the next progression would be to RW to even out WB if PT follow-up deems it appropriate. Pt understanding and appreciative of conversation and in good spirits.  Prior to [facility], pt participated in contract relax to L plantarflexors to lengthen while PTA also stretching L knee flexors to improve upright stance (no notable improvement in stance, however, L AFO was easier to donn this time vs at beginning of session as pt presented with increased plantarflexor/inverter tone).   Neuromuscular Re-ed: NMR facilitated during session with focus on standing balance, weight shift, proprioceptive feedback. - Standing in // bars with R UE support stepping to 2" step with R LE with VC to improve weight shift to L per R shift  compensation. Pt with CGA for safety and cues to maintain upright posture as L lower-body anatomy is able to.  - Progressed to PTA pushing pt to the R for error augmentation with VC for pt to push against resistance to promote weight shift to L - Pt statically standing in // bars with no UE support and CGA/very light minA occasionally. Pt cued to maintain centered/upright posture as long as able with pt reporting feeling "burn" in L quadriceps (pt educated on function of quadriceps to maintain stance). Pt then cued to shift weight anteriorly/posteriorly to focus on ankle strategies to maintain standing balance (cues to press toes into floor to shift forward). Pt required CGA for safety, and seated rest break requested.  NMR performed for improvements in motor control and coordination, balance, sequencing, judgement, and self confidence/ efficacy in performing all aspects of mobility at highest level of independence.   Patient sitting in WC at end of session with brakes locked, and all needs within reach.  SESSION 2 Skilled Therapeutic Interventions/Progress Updates: Patient sitting in WC on entrance to room. Patient alert and agreeable to PT session.   Patient reported no pain during session. Pt transported in [facility] outside of [facility] to navigate non-compliant surfaces to improve ambulatory distance in the community, as well as to change pt's environment (pt reported not being outside since [facility] admission) to boost morale. Pt ambulated roughly 100' x 2 with increased time to do so. Pt with step to pattern to avoid L plantarflexor/inverter tone (increases with increased step length on R LE). Pt with CGA/close supervision for  safety. Both bouts, pt required increased time to rest in seated position due to fatigue/SOB (HR and SpO2 recorded below). Pt cued to perform deep breathing through nostrils (rebounded from slight SpO2 drop on 2nd bout after short rest break). Pt transported back to room in [facility] at end of  session and participated in AROM of L plantarflexors ("press front of foot towards ground"), and L dorsiflexors/evertors ("up and out") in order to improve ankle mobility. Pt instructed to perform this when sitting in WC throughout the day. Pt thankful to ambulate outside and reported it feels like a bit "more like normalcy."  Patient sitting in WC at end of session with brakes locked, and all needs within reach.       Therapy Documentation Precautions:  Precautions Precautions: Fall Recall of Precautions/Restrictions: Impaired Precaution/Restrictions Comments: L hemiplegia Required Braces or Orthoses: Other Brace Other Brace: L AFO Restrictions Weight Bearing Restrictions Per Provider Order: No  Vitals HR 110; SpO2 95% - after 2nd gait trial  Therapy/Group: Individual Therapy  Glendora Clouatre PTA 08/09/2023, 12:29 PM

## 2023-08-10 NOTE — Plan of Care (Signed)
  Problem: Consults Goal: RH GENERAL PATIENT EDUCATION Description: See Patient Education module for education specifics. Outcome: Progressing   Problem: RH BOWEL ELIMINATION Goal: RH STG MANAGE BOWEL WITH ASSISTANCE Description: STG Manage Bowel with supervision Assistance. Outcome: Progressing   Problem: RH BLADDER ELIMINATION Goal: RH STG MANAGE BLADDER WITH ASSISTANCE Description: STG Manage Bladder With supervision Assistance Outcome: Progressing   Problem: RH SKIN INTEGRITY Goal: RH STG SKIN FREE OF INFECTION/BREAKDOWN Description: Manage skin free of infection/breakdown  with supervision assistance Outcome: Progressing   Problem: RH SAFETY Goal: RH STG ADHERE TO SAFETY PRECAUTIONS W/ASSISTANCE/DEVICE Description: STG Adhere to Safety Precautions With supervision Assistance/Device. Outcome: Progressing

## 2023-08-10 NOTE — Progress Notes (Signed)
 PROGRESS NOTE   Subjective/Complaints:  No issues  Remains constipated   ROS- no CP, SOB, N/V/D  Objective:   No results found.  No results for input(s): "WBC", "HGB", "HCT", "PLT" in the last 72 hours.  No results for input(s): "NA", "K", "CL", "CO2", "GLUCOSE", "BUN", "CREATININE", "CALCIUM " in the last 72 hours.   Intake/Output Summary (Last 24 hours) at 08/10/2023 0848 Last data filed at 08/10/2023 0800 Gross per 24 hour  Intake 1360 ml  Output 3825 ml  Net -2465 ml        Physical Exam: Vital Signs Blood pressure 127/73, pulse 67, temperature 98.2 F (36.8 C), resp. rate 18, height 5\' 10"  (1.778 m), weight 87 kg, SpO2 96%.   General: No acute distress Mood and affect are appropriate Heart: Regular rate and rhythm no rubs murmurs or extra sounds Lungs: Clear to auscultation, breathing unlabored, no rales or wheezes Abdomen: Positive bowel sounds, soft nontender to palpation, +distended, + tympany Extremities: No clubbing, cyanosis, or edema Skin: No evidence of breakdown, no evidence of rash  motor strength is 5/5 in right and  trace left deltoid, bicep, tricep, grip, hip flexor, knee extensors, ankle dorsiflexor and plantar flexor Tone increased left hand lumbricals and wrist flexors as well as Left foot plantar flexors and invertors Sensory exam normal sensation to light touch and proprioception in bilateral upper and lower extremities Cerebellar exam unable to perform on left due to weakness Musculoskeletal: Reduced ROM Left shoulder and hip with tone influence   Assessment/Plan: 1. Functional deficits which require 3+ hours per day of interdisciplinary therapy in a comprehensive inpatient rehab setting. Physiatrist is providing close team supervision and 24 hour management of active medical problems listed below. Physiatrist and rehab team continue to assess barriers to discharge/monitor patient  progress toward functional and medical goals  Care Tool:  Bathing    Body parts bathed by patient: Chest, Abdomen, Front perineal area, Right upper leg, Left upper leg, Right lower leg, Left lower leg, Face, Left arm, Buttocks   Body parts bathed by helper: Right arm     Bathing assist Assist Level: Minimal Assistance - Patient > 75%     Upper Body Dressing/Undressing Upper body dressing   What is the patient wearing?: Pull over shirt    Upper body assist Assist Level: Minimal Assistance - Patient > 75%    Lower Body Dressing/Undressing Lower body dressing      What is the patient wearing?: Underwear/pull up, Pants     Lower body assist Assist for lower body dressing: Moderate Assistance - Patient 50 - 74%     Toileting Toileting    Toileting assist Assist for toileting: Minimal Assistance - Patient > 75%     Transfers Chair/bed transfer  Transfers assist  Chair/bed transfer activity did not occur: Safety/medical concerns  Chair/bed transfer assist level: Minimal Assistance - Patient > 75%     Locomotion Ambulation   Ambulation assist      Assist level: Contact Guard/Touching assist Assistive device: Walker-hemi Max distance: 90   Walk 10 feet activity   Assist     Assist level: Contact Guard/Touching assist Assistive device: Walker-hemi   Walk 50  feet activity   Assist    Assist level: Contact Guard/Touching assist Assistive device: Walker-hemi    Walk 150 feet activity   Assist Walk 150 feet activity did not occur: Safety/medical concerns (fatigue/SOB)         Walk 10 feet on uneven surface  activity   Assist Walk 10 feet on uneven surfaces activity did not occur: Safety/medical concerns (fatigue)         Wheelchair     Assist Is the patient using a wheelchair?: Yes Type of Wheelchair: Manual    Wheelchair assist level: Set up assist, Supervision/Verbal cueing Max wheelchair distance: 147ft    Wheelchair 50  feet with 2 turns activity    Assist        Assist Level: Supervision/Verbal cueing   Wheelchair 150 feet activity     Assist      Assist Level: Supervision/Verbal cueing   Blood pressure 127/73, pulse 67, temperature 98.2 F (36.8 C), resp. rate 18, height 5\' 10"  (1.778 m), weight 87 kg, SpO2 96%.  Medical Problem List and Plan: 1. Functional deficits secondary to debility 2/2 urosepsis and AKI             -patient may shower             -ELOS/Goals: 7 days S             Admit to CIR   2.  Lupus anticoagulant/Antithrombotics: -DVT/anticoagulation:  Pharmaceutical: Xarelto              -antiplatelet therapy: ASA resumed.   3. Pain Management: tylenol  prn   4. Mood/Behavior/Sleep: LCSW to evaluate and provide support prn.              -antipsychotic agents: N/A             --melatonin prn for insomnia.    5. Neuropsych/cognition: This patient is capable of making decisions on his own behalf.   6. Skin/Wound Care: routine pressure relief measures.    7. Fluids/Electrolytes/Nutrition: Monitor I/O. Check CMET in am.     Latest Ref Rng & Units 08/07/2023    6:06 AM 08/06/2023    6:07 AM 08/05/2023    6:05 AM  BMP  Glucose 70 - 99 mg/dL 119  147  829   BUN 8 - 23 mg/dL 23  28  31    Creatinine 0.61 - 1.24 mg/dL 5.62  1.30  8.65   Sodium 135 - 145 mmol/L 137  137  134   Potassium 3.5 - 5.1 mmol/L 4.0  3.6  3.7   Chloride 98 - 111 mmol/L 107  108  105   CO2 22 - 32 mmol/L 19  21  22    Calcium  8.9 - 10.3 mg/dL 8.7  8.6  8.3       8. Urosepsis/E coli UTI: Treated with antibiotics thru 04/20   9. Acute renal failure: resolving. Continue to monitor   10. HTN: Monitor BP TID Vitals:   08/09/23 2001 08/10/23 0521  BP: 113/65 127/73  Pulse: 72 67  Resp: 18 18  Temp: 98.4 F (36.9 C) 98.2 F (36.8 C)  SpO2: 97% 96%      11. Chronic intermittent diarrhea: continue probiotics. Add fiber   12. Neurogenic bladder: Discontinue foley --Resume I/O caths 4-6 hours  to keep bladder volumes < 350 cc Seen by Dr Dulcy Gibney as OP , urinary retention, cont I/O caths   last visit in November - no role for flomax  per Uro  13. Leucocytosis: Resolving from 23.9--> 11.8.    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    Latest Ref Rng & Units 08/07/2023    6:06 AM 08/06/2023    6:07 AM 08/05/2023    6:05 AM  CBC  WBC 4.0 - 10.5 K/uL 9.8  11.8  14.1   Hemoglobin 13.0 - 17.0 g/dL 82.9  9.9  56.2   Hematocrit 39.0 - 52.0 % 32.5  32.9  33.8   Platelets 150 - 400 K/uL 421  375  332       15. Severe Neuropathy BLE w/gait d/o: scheduled for outpatient Qutenza-has  sensation at ankle on right but not left, no LT sensation at feet    16. 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  Needs resting hand splint and PRAFO left side, thinks he has these at home , will need wife to bring in  [facility] in room, pt states he does not have PRAFO will order  May benefit from Xeomin injection 400U - can do as OP , discussed that this would not increase functional use of UE, further improvement unlikely    17. Seizure d/o: Stable on Keppra  1500 mg BID   18. H/o depression/anxiety: continue Sertraline  and Bupropion .     19.  Abd distention - + stool and gas on KUB but no obstructive signs  Incidental finding of T12 and T11 vertebroplasty ( chart says from 2008)  BM 4/24 will change miralax  to BID and use dulc supp today   LOS: 4 days A FACE TO FACE EVALUATION WAS PERFORMED  Genetta Kenning 08/10/2023, 8:48 AM

## 2023-08-11 DIAGNOSIS — D62 Acute posthemorrhagic anemia: Secondary | ICD-10-CM

## 2023-08-11 DIAGNOSIS — R252 Cramp and spasm: Secondary | ICD-10-CM

## 2023-08-11 DIAGNOSIS — N179 Acute kidney failure, unspecified: Secondary | ICD-10-CM

## 2023-08-11 LAB — CBC
HCT: 31.6 % — ABNORMAL LOW (ref 39.0–52.0)
Hemoglobin: 9.7 g/dL — ABNORMAL LOW (ref 13.0–17.0)
MCH: 28.7 pg (ref 26.0–34.0)
MCHC: 30.7 g/dL (ref 30.0–36.0)
MCV: 93.5 fL (ref 80.0–100.0)
Platelets: 496 10*3/uL — ABNORMAL HIGH (ref 150–400)
RBC: 3.38 MIL/uL — ABNORMAL LOW (ref 4.22–5.81)
RDW: 16.8 % — ABNORMAL HIGH (ref 11.5–15.5)
WBC: 9.4 10*3/uL (ref 4.0–10.5)
nRBC: 1.7 % — ABNORMAL HIGH (ref 0.0–0.2)

## 2023-08-11 LAB — BASIC METABOLIC PANEL WITH GFR
Anion gap: 8 (ref 5–15)
BUN: 20 mg/dL (ref 8–23)
CO2: 23 mmol/L (ref 22–32)
Calcium: 8.9 mg/dL (ref 8.9–10.3)
Chloride: 107 mmol/L (ref 98–111)
Creatinine, Ser: 1.38 mg/dL — ABNORMAL HIGH (ref 0.61–1.24)
GFR, Estimated: 52 mL/min — ABNORMAL LOW (ref >60)
Glucose, Bld: 105 mg/dL — ABNORMAL HIGH (ref 70–99)
Potassium: 4.4 mmol/L (ref 3.5–5.1)
Sodium: 138 mmol/L (ref 135–145)

## 2023-08-11 LAB — OCCULT BLOOD X 1 CARD TO LAB, STOOL: Fecal Occult Bld: NEGATIVE

## 2023-08-11 NOTE — Progress Notes (Signed)
Stool sample collected and sent to lab per MD order.   Tilden Dome, LPN

## 2023-08-11 NOTE — Progress Notes (Signed)
 Patient was successful with I/o Cath throughout the shift with set-up assistance.    Randeen Busman, LPN

## 2023-08-11 NOTE — Progress Notes (Signed)
 Occupational Therapy Session Note  Patient Details  Name: Mark [name] MRN: [id number] Date of Birth: August 12, 1945  Today's Date: 08/11/2023 OT Individual Time:  -  and 1415-1535    and 80 min    Short Term Goals: Week 1:  OT Short Term Goal 1 (Week 1): STGs = LTGs  Skilled Therapeutic Interventions/Progress Updates:    Visit 1:  no c/o pain  Pt received in bed and agreeable to therapy.  Discussed his therapy schedule and decided to do a shower during the second session when he would have more time. Noticed his L hand splint was twisted around and not on hand fully as it probably slid down during the night.  The splint was not fitting well so tried to adjust the thumb piece. The splint seems to be too small. MD is checking to see if there is a large size. He may benefit from a custom splint through his outpt clinic.  This session pt ambulated to sink to brush teeth, wash face and comb hair and then returned to sit in recliner.  Engaged in activities for self ROM for LUE. Used his hemiwalker in left hand as spacticity allowed him to maintain grasp on walker handle and then used R hand to move walker forward and back to allow to stretch elbow.    Had pt work on arm slides on towel on arm rest of chair and then self stretching of thumb using a tennis ball.  Pt resting in recliner with all needs met.     Visit 2:  no c/o  Pt ready to engage in self care.  Pt needed to toilet first.  Pt able to stand from recliner with CGA and ambulated with hemiwaker with CGA to toilet. Pt sat to toilet and was able to have another bowel movement.  Pt self cleanses using several wash cloths.  Doffed clothing with supervision and then stepped to shower.   Pt showered using long sponge to wash under R arm and R shoulder and feet.  Pt would benefit from an open seat tub bench to reach bottom more easily. Transferred to w/c to dress and then to recliner.  Pt participated well. He discussed the week long stroke camps he  has participated in and then books he has read about neuroplasticity.  Pt resting in recliner with all needs met and belt alarm on.   Therapy Documentation Precautions:  Precautions Precautions: Fall Recall of Precautions/Restrictions: Impaired Precaution/Restrictions Comments: L hemiplegia Required Braces or Orthoses: Other Brace Other Brace: L AFO Restrictions Weight Bearing Restrictions Per Provider Order: No    Vital Signs: Therapy Vitals Temp: 98.7 F (37.1 C) Pulse Rate: 73 Resp: 16 BP: 114/63 Patient Position (if appropriate): Sitting Oxygen Therapy SpO2: 94 % O2 Device: Room Air ADL: ADL Eating: Set up Grooming: Supervision/safety Upper Body Bathing: Supervision/safety (used long sponge to wash under R arm and R shoulder) Lower Body Bathing: Contact guard, Minimal assistance Upper Body Dressing: Supervision/safety, Contact guard (min CGA to pull shirt down lower part of back.) Where Assessed-Upper Body Dressing: Chair Lower Body Dressing: Minimal assistance, Moderate assistance (able to pull underwear and pants over feet with supervision, min A to pull fully over L hip) Where Assessed-Lower Body Dressing: Chair Toileting: Minimal assistance Toilet Transfer: Minimal assistance Toilet Transfer Method: Ambulating Tub/Shower Transfer: Not assessed Film/video editor: Minimal assistance Film/video editor Method: Designer, industrial/product: Emergency planning/management officer, Other (comment)   Therapy/Group: Individual Therapy  Ario Mcdiarmid 08/11/2023, 3:57 PM

## 2023-08-11 NOTE — Progress Notes (Signed)
 Physical Therapy Session Note  Patient Details  Name: Mark [name] MRN: [id number] Date of Birth: [birth year]  Today's Date: 08/11/2023 PT Individual Time: 1015-1129 PT Individual Time Calculation (min): 74 min   Short Term Goals: Week 1:  PT Short Term Goal 1 (Week 1): STG=LTG 2/2 ELOS  Skilled Therapeutic Interventions/Progress Updates:      Pt sitting up in recliner on arrival - has no complaints of pain but reports feeling "sleepy." BP checked to rule out low BP.  Sitting: 121/74 HR 70 Standing: 128/69 HR 76 *Asymptomatic.  Sit<>stand to [facility] with CGA with assist for positioning his RLE 2/2 increased tone. Stand pivot transfer using HW with light minA for stability and guiding while turning to sit into his wc. Transported in w/c to main gym for time management.   Focused some time on managing his tone for his heel cord - completed standing stretching at the bottom of the stairs with PT managing his R ankle/knee to provide manual facilitation for gastroc vs soleus stretching. Then worked on more aggressive stretching with standing on incline foam wedge at counter support. Pt needing minA for balance for all standing stretching.   Stair training completed using 6" steps and 1 HR on his R side. Pt navigating up/down x4 + x4 steps (seated rest break) with modA overall for stability and balance. Pt's R shoe/AFO coming out while descending the stairs - needing + 2 assist to help with redonning shoe and AFO.   Pt then instructed in gait training ~15ft with HW and CGA, demonstrating a step-to pattern with forward flexed trunk. During gait training, pt reporting urgent need to have BM so returned to his room and assisted to toilet. Pt already with incontinent episode in brief and then continent of further BM once on toilet.   TotalA needed for posterior pericare and for donning a new clean brief/pants, socks and shoes. Ambulatory transfer completed with minA and HW around his room to the  recliner - RUE propped with pillow and back supported as well. BLE elevated for comfort and passive hamstring stretching. Left with needs in reach and call bell in lap. NT collecting stool sample.   Therapy Documentation Precautions:  Precautions Precautions: Fall Recall of Precautions/Restrictions: Impaired Precaution/Restrictions Comments: L hemiplegia Required Braces or Orthoses: Other Brace Other Brace: L AFO Restrictions Weight Bearing Restrictions Per Provider Order: No General:      Therapy/Group: Individual Therapy  Pheobe Brass 08/11/2023, 7:58 AM

## 2023-08-11 NOTE — Plan of Care (Signed)
  Problem: Consults Goal: RH GENERAL PATIENT EDUCATION Description: See Patient Education module for education specifics. Outcome: Progressing   Problem: RH BOWEL ELIMINATION Goal: RH STG MANAGE BOWEL WITH ASSISTANCE Description: STG Manage Bowel with supervision Assistance. Outcome: Progressing   Problem: RH BLADDER ELIMINATION Goal: RH STG MANAGE BLADDER WITH ASSISTANCE Description: STG Manage Bladder With supervision Assistance Outcome: Progressing   Problem: RH SKIN INTEGRITY Goal: RH STG SKIN FREE OF INFECTION/BREAKDOWN Description: Manage skin free of infection/breakdown  with supervision assistance Outcome: Progressing   Problem: RH SAFETY Goal: RH STG ADHERE TO SAFETY PRECAUTIONS W/ASSISTANCE/DEVICE Description: STG Adhere to Safety Precautions With supervision Assistance/Device. Outcome: Progressing   Problem: RH PAIN MANAGEMENT Goal: RH STG PAIN MANAGED AT OR BELOW PT'S PAIN GOAL Description: <4 w/ prns Outcome: Progressing   Problem: RH KNOWLEDGE DEFICIT GENERAL Goal: RH STG INCREASE KNOWLEDGE OF SELF CARE AFTER HOSPITALIZATION Description: Manage increase knowledge of self care after hospitalization with supervision from wife/family using educational materials provided Outcome: Progressing

## 2023-08-11 NOTE — Progress Notes (Signed)
 PROGRESS NOTE   Subjective/Complaints:  Pt without complaints. His wife is a little anxious about him coming home. Concerned that medical things might come up again. He says she can be a bit "OCD" at times. Having a hard time keeping WHO on left hand.    ROS: Patient denies fever, rash, sore throat, blurred vision, dizziness, nausea, vomiting, diarrhea, cough, shortness of breath or chest pain, joint or back/neck pain, headache, or mood change.   Objective:   No results found.  Recent Labs    08/11/23 0533  WBC 9.4  HGB 9.7*  HCT 31.6*  PLT 496*    Recent Labs    08/11/23 0533  NA 138  K 4.4  CL 107  CO2 23  GLUCOSE 105*  BUN 20  CREATININE 1.38*  CALCIUM  8.9     Intake/Output Summary (Last 24 hours) at 08/11/2023 0858 Last data filed at 08/11/2023 0725 Gross per 24 hour  Intake 816 ml  Output 2937 ml  Net -2121 ml        Physical Exam: Vital Signs Blood pressure 130/71, pulse 74, temperature 98.7 F (37.1 C), temperature source Oral, resp. rate 18, height 5\' 10"  (1.778 m), weight 87 kg, SpO2 96%.   Constitutional: No distress . Vital signs reviewed. HEENT: NCAT, EOMI, oral membranes moist Neck: supple Cardiovascular: RRR without murmur. No JVD    Respiratory/Chest: CTA Bilaterally without wheezes or rales. Normal effort    GI/Abdomen: BS +, non-tender, non-distended Ext: no clubbing, cyanosis, or edema Psych: pleasant and cooperative  Skin: No evidence of breakdown, no evidence of rash  motor strength is 5/5 in right and  trace left deltoid, bicep, tricep, grip, hip flexor, knee extensors, ankle dorsiflexor and plantar flexor Tone increased left hand lumbricals and wrist flexors as well as Left foot plantar flexors and invertors--no changes today ~2/4, WHO looks a little small Sensory exam normal sensation to light touch and proprioception in bilateral upper and lower extremities Cerebellar exam  unable to perform on left due to weakness Musculoskeletal: Reduced ROM Left shoulder and hip with tone influence   Assessment/Plan: 1. Functional deficits which require 3+ hours per day of interdisciplinary therapy in a comprehensive inpatient rehab setting. Physiatrist is providing close team supervision and 24 hour management of active medical problems listed below. Physiatrist and rehab team continue to assess barriers to discharge/monitor patient progress toward functional and medical goals  Care Tool:  Bathing    Body parts bathed by patient: Chest, Abdomen, Front perineal area, Right upper leg, Left upper leg, Right lower leg, Left lower leg, Face, Left arm, Buttocks   Body parts bathed by helper: Right arm     Bathing assist Assist Level: Minimal Assistance - Patient > 75%     Upper Body Dressing/Undressing Upper body dressing   What is the patient wearing?: Pull over shirt    Upper body assist Assist Level: Minimal Assistance - Patient > 75%    Lower Body Dressing/Undressing Lower body dressing      What is the patient wearing?: Underwear/pull up, Pants     Lower body assist Assist for lower body dressing: Moderate Assistance - Patient 50 - 74%  Toileting Toileting    Toileting assist Assist for toileting: Minimal Assistance - Patient > 75%     Transfers Chair/bed transfer  Transfers assist  Chair/bed transfer activity did not occur: Safety/medical concerns  Chair/bed transfer assist level: Minimal Assistance - Patient > 75%     Locomotion Ambulation   Ambulation assist      Assist level: Contact Guard/Touching assist Assistive device: Walker-hemi Max distance: 90   Walk 10 feet activity   Assist     Assist level: Contact Guard/Touching assist Assistive device: Walker-hemi   Walk 50 feet activity   Assist    Assist level: Contact Guard/Touching assist Assistive device: Walker-hemi    Walk 150 feet activity   Assist Walk  150 feet activity did not occur: Safety/medical concerns (fatigue/SOB)         Walk 10 feet on uneven surface  activity   Assist Walk 10 feet on uneven surfaces activity did not occur: Safety/medical concerns (fatigue)         Wheelchair     Assist Is the patient using a wheelchair?: Yes Type of Wheelchair: Manual    Wheelchair assist level: Set up assist, Supervision/Verbal cueing Max wheelchair distance: 152ft    Wheelchair 50 feet with 2 turns activity    Assist        Assist Level: Supervision/Verbal cueing   Wheelchair 150 feet activity     Assist      Assist Level: Supervision/Verbal cueing   Blood pressure 130/71, pulse 74, temperature 98.7 F (37.1 C), temperature source Oral, resp. rate 18, height 5\' 10"  (1.778 m), weight 87 kg, SpO2 96%.  Medical Problem List and Plan: 1. Functional deficits secondary to debility 2/2 urosepsis and AKI             -patient may shower             -ELOS/Goals: 7 days S             -Continue CIR therapies including PT, OT    -spoke to [facility] about checking size of PRAFO (not marked). May need a size up (?large) 2.  Lupus anticoagulant/Antithrombotics: -DVT/anticoagulation:  Pharmaceutical: Xarelto              -antiplatelet therapy: ASA resumed.   3. Pain Management: tylenol  prn   4. Mood/Behavior/Sleep: LCSW to evaluate and provide support prn.              -antipsychotic agents: N/A             --melatonin prn for insomnia.    5. Neuropsych/cognition: This patient is capable of making decisions on his own behalf.   6. Skin/Wound Care: routine pressure relief measures.    7. Fluids/Electrolytes/Nutrition: Monitor I/O. Check CMET in am.     Latest Ref Rng & Units 08/11/2023    5:33 AM 08/07/2023    6:06 AM 08/06/2023    6:07 AM  BMP  Glucose 70 - 99 mg/dL 478  295  621   BUN 8 - 23 mg/dL 20  23  28    Creatinine 0.61 - 1.24 mg/dL 3.08  6.57  8.46   Sodium 135 - 145 mmol/L 138  137  137   Potassium  3.5 - 5.1 mmol/L 4.4  4.0  3.6   Chloride 98 - 111 mmol/L 107  107  108   CO2 22 - 32 mmol/L 23  19  21    Calcium  8.9 - 10.3 mg/dL 8.9  8.7  8.6  4/28- labs appear generally stable    -push fluids 8. Urosepsis/E coli UTI: Treated with antibiotics thru 04/20   9. Acute renal failure: resolving. Continue to monitor   4/28 Cr has ranged from 1.2 to 1.3   -encourage fluids 10. HTN: Monitor BP TID Vitals:   08/10/23 1358 08/10/23 1939  BP: 120/62 130/71  Pulse: 71 74  Resp: 18 18  Temp: 98.2 F (36.8 C) 98.7 F (37.1 C)  SpO2: 94% 96%      11. Chronic intermittent diarrhea: continue probiotics. Add fiber   12. Neurogenic bladder: Discontinue foley --Resume I/O caths 4-6 hours to keep bladder volumes < 350 cc Seen by Dr Dulcy Gibney as OP , urinary retention   last visit in November - no role for flomax  per Uro   -continue I/O caths 13. Leucocytosis: Resolving from 23.9--> 11.8.    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    Latest Ref Rng & Units 08/11/2023    5:33 AM 08/07/2023    6:06 AM 08/06/2023    6:07 AM  CBC  WBC 4.0 - 10.5 K/uL 9.4  9.8  11.8   Hemoglobin 13.0 - 17.0 g/dL 9.7  16.1  9.9   Hematocrit 39.0 - 52.0 % 31.6  32.5  32.9   Platelets 150 - 400 K/uL 496  421  375       15. Severe Neuropathy BLE w/gait d/o: scheduled for outpatient [facility]-has  sensation at ankle on right but not left, no LT sensation at feet    16. 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  Needs resting hand splint and PRAFO left side, thinks he has these at home , will need wife to bring in  [facility] in room, pt states he does not have PRAFO will order  May benefit from Xeomin injection 400U - can do as OP , discussed that this would not increase functional use of UE, further improvement unlikely    4/28 we discussed tone today. He benefits from it in LLE for mobility   -outpt  xeomin for LUE? 17. Seizure d/o: Stable on Keppra  1500 mg BID   18. H/o depression/anxiety: continue Sertraline  and Bupropion .     19.  Abd distention - + stool and gas on KUB but no obstructive signs  Incidental finding of T12 and T11 vertebroplasty ( chart says from 2008)  BM 4/24 will change miralax  to BID and use dulc supp today   -LBM 4/26 20. Anemia:  4/28 -sl drop in Hgb today. No gross blood loss  -check stool for OB, recheck hgb inam LOS: 5 days A FACE TO FACE EVALUATION WAS PERFORMED  Rawland Caddy 08/11/2023, 8:58 AM

## 2023-08-11 NOTE — Discharge Instructions (Addendum)
 Inpatient Rehab Discharge Instructions  Mark [name] Discharge date and time:  08/14/23  Activities/Precautions/ Functional Status: Activity: no lifting, driving, or strenuous exercise  till cleared by MD Diet: regular diet Wound Care: none needed   Functional status:  ___ No restrictions     ___ Walk up steps independently _X__ 24/7 supervision/assistance   ___ Walk up steps with assistance ___ Intermittent supervision/assistance  ___ Bathe/dress independently ___ Walk with walker     _X__ Bathe/dress with assistance ___ Walk Independently    ___ Shower independently ___ Walk with assistance    ___ Shower with assistance _X__ No alcohol      ___ Return to work/school ________   Special Instructions: Keep record of fluid intake and cath 4-5 times a day to keep volumes below 350 cc. Drink at least 2000 cc water per dayl   My questions have been answered and I understand these instructions. I will adhere to these goals and the provided educational materials after my discharge from the hospital.  Patient/Caregiver Signature _______________________________ Date __________  Clinician Signature _______________________________________ Date __________  Please bring this form and your medication list with you to all your follow-up doctor's appointments.

## 2023-08-12 ENCOUNTER — Other Ambulatory Visit (HOSPITAL_COMMUNITY): Payer: Self-pay

## 2023-08-12 ENCOUNTER — Encounter: Payer: Self-pay | Admitting: Hematology & Oncology

## 2023-08-12 LAB — CBC
HCT: 30.2 % — ABNORMAL LOW (ref 39.0–52.0)
Hemoglobin: 9.6 g/dL — ABNORMAL LOW (ref 13.0–17.0)
MCH: 29.2 pg (ref 26.0–34.0)
MCHC: 31.8 g/dL (ref 30.0–36.0)
MCV: 91.8 fL (ref 80.0–100.0)
Platelets: 456 10*3/uL — ABNORMAL HIGH (ref 150–400)
RBC: 3.29 MIL/uL — ABNORMAL LOW (ref 4.22–5.81)
RDW: 16.6 % — ABNORMAL HIGH (ref 11.5–15.5)
WBC: 9.9 10*3/uL (ref 4.0–10.5)
nRBC: 1.5 % — ABNORMAL HIGH (ref 0.0–0.2)

## 2023-08-12 MED ORDER — CALCIUM POLYCARBOPHIL 625 MG PO TABS: 625.0000 mg | ORAL_TABLET | Freq: Every day | ORAL | Status: DC

## 2023-08-12 MED ORDER — POLYETHYLENE GLYCOL 3350 17 G PO PACK
17.0000 g | PACK | Freq: Two times a day (BID) | ORAL | 0 refills | Status: AC
  Filled 2023-08-12: qty 14, 7d supply, fill #0

## 2023-08-12 MED ORDER — ADULT MULTIVITAMIN W/MINERALS CH: 1.0000 | ORAL_TABLET | Freq: Every day | ORAL | Status: DC

## 2023-08-12 MED ORDER — SACCHAROMYCES BOULARDII 250 MG PO CAPS: 250.0000 mg | ORAL_CAPSULE | Freq: Two times a day (BID) | ORAL | Status: DC

## 2023-08-12 NOTE — Discharge Summary (Signed)
 Physician Discharge Summary  Patient ID: Mark [name] MRN: [id number] DOB/AGE: 1945-04-28 78 y.o.  Admit date: 08/06/2023 Discharge date: 08/14/2023  Discharge Diagnoses:  Principal Problem:   Debility Active Problems:   Lupus anticoagulant disorder (HCC)   CKD (chronic kidney disease), stage III (HCC)   Neurogenic bladder   Neuropathic pain   Constipation  Discharged Condition: stable  Significant Diagnostic Studies: DG Abd Portable 1V Result Date: 08/07/2023 CLINICAL DATA:  Abdominal distention EXAM: PORTABLE ABDOMEN - 1 VIEW COMPARISON:  None Available. FINDINGS: Moderate amount of residual fecal material throughout the entire colon without obstruction. Fecal impaction rectum. IMPRESSION: Moderate amount of residual fecal material throughout the entire colon without obstruction. Fecal impaction rectum. Electronically Signed   By: Fredrich Jefferson M.D.   On: 08/07/2023 10:39   DG Abd 2 Views Result Date: 08/02/2023 CLINICAL DATA:  Thrombocytopenia. Complains of loose stools and abdominal tightness. EXAM: ABDOMEN - 2 VIEW COMPARISON:  None FINDINGS: Mild gaseous distension of the stomach. No dilated loops of small bowel. There is a moderate stool burden noted within the colon. No signs pneumoperitoneum. Severe degenerative changes involving the right hip. Surgical clips noted within the left upper quadrant and right upper quadrant of the abdomen. IMPRESSION: 1. No signs of bowel obstruction. 2. Moderate stool burden noted within the colon. Electronically Signed   By: Kimberley Penman M.D.   On: 08/02/2023 13:21   DG Chest Port 1 View Result Date: 07/28/2023 CLINICAL DATA:  Left-sided paralysis, fever. Pyuria. Possible sepsis. EXAM: PORTABLE CHEST 1 VIEW COMPARISON:  07/15/2023 FINDINGS: The patient is rotated to the right on today's radiograph, reducing diagnostic sensitivity and specificity. Low lung volumes are present, causing crowding of the pulmonary vasculature. Indistinct pulmonary  vasculature, cannot exclude pulmonary venous hypertension although no overt cardiomegaly is observed. Chronic right lower rib deformities likely from old healed fractures. Vertebral augmentations at T12 and L1. No overt consolidation/airspace opacity identified. No blunting of the costophrenic angles. IMPRESSION: 1. Low lung volumes are present, causing crowding of the pulmonary vasculature. 2. Indistinct pulmonary vasculature, cannot exclude pulmonary venous hypertension although no overt cardiomegaly is observed. 3. Chronic right lower rib deformities likely from old healed fractures. 4. Vertebral augmentations at T12 and L1. Electronically Signed   By: Freida Jes M.D.   On: 07/28/2023 14:22    Labs:  Basic Metabolic Panel:    Latest Ref Rng & Units 08/14/2023    5:53 AM 08/11/2023    5:33 AM 08/07/2023    6:06 AM  BMP  Glucose 70 - 99 mg/dL 98  295  284   BUN 8 - 23 mg/dL 22  20  23    Creatinine 0.61 - 1.24 mg/dL 1.32  4.40  1.02   Sodium 135 - 145 mmol/L 140  138  137   Potassium 3.5 - 5.1 mmol/L 3.9  4.4  4.0   Chloride 98 - 111 mmol/L 106  107  107   CO2 22 - 32 mmol/L 23  23  19    Calcium  8.9 - 10.3 mg/dL 9.1  8.9  8.7      CBC: Recent Labs  Lab 08/11/23 0533 08/12/23 0510 08/14/23 0553  WBC 9.4 9.9 8.2  HGB 9.7* 9.6* 11.0*  HCT 31.6* 30.2* 35.8*  MCV 93.5 91.8 94.0  PLT 496* 456* 387    CBG: No results for input(s): "GLUCAP" in the last 168 hours.  Brief HPI:   Mark [name] is a 78 y.o. male with history of HTN, OSA,  CVA with left arm paresis, lupus anticoagulant, neurogenic bladder, relapsed ITP with severe thrombocytopenia treated with IVIG, Nplate  and Decadron  with most recent hospital stay with rehab and discharged to home on 07/22/2023.  He was readmitted to [facility] on 07/28/2023 with leukocytosis, hypotension and rigors and chills due to urosepsis.  He was treated with IV fluids and 7-day course of antibiotics through 04/20.  He did have  recurrent drop in platelets on 04/15 and ASA was discontinued and Xarelto  placed on hold.  He was treated with Decadron , Nplate  and IVIG per input from Dr. Maria Shiner.  As platelets recovered to 375; Xarelto  was resumed with recommendations to resume aspirin  at discharge.  Renal status has improved and hypokalemia was supplemented.  Therapy was consulted and patient was noted to be requiring min assist overall.  At baseline patient required supervision and CIR was recommended due to functional decline.   Hospital Course: Mark [name] was admitted to rehab 08/06/2023 for inpatient therapies to consist of PT and OT at least three hours five days a week. Past admission physiatrist, therapy team and rehab RN have worked together to provide customized collaborative inpatient rehab. His blood pressures were monitored on TID basis and has been stable without amlodipine  on board. Serial check of BMET showed improvement in BUN with SCr trending to baseline.  ASA was resumed at admission and he continues on Xarelto  without  signs of recurrent nosebleeds.  Follow up CBC showed platelets to be stable and H/H.  Miralax  was titrated to BID to help manage constipation.   Mood is stable on home regimen of Wellbutrin  and Zoloft . His intake has been good and he has continent of bowel. I/O caths are onging 4-5 times a day to keep bladder volumes below 350 cc.  Follow-up CBC showed leukocytosis has resolved and platelets are stable.  He continues to be limited by spastic left hemiparesis and Xeomin injection to help with functional use of UE was discussed with patient.  He has made gains during his stay and is currently at supervision level.  He will continue to receive follow-up home health PT and OT after discharge.   Rehab course: During patient's stay in rehab weekly team conferences were held to monitor patient's progress, set goals and discuss barriers to discharge. At admission, patient required min assist with basic self  care task and with mobility.  He  has had improvement in activity tolerance, balance, postural control as well as ability to compensate for deficits.  He is able to complete ADL tasks with supervision. He requires supervision with verbal cues for transfers and to ambulate 150 feet with use of hemiwalker.  Family education has been completed.  Disposition: Home with Home health.   Diet: Regular  Special Instructions: Continue self cath 4-5 times a day.   Allergies as of 08/14/2023       Reactions   Benadryl  [diphenhydramine ] Other (See Comments)   Affects kidneys   Pepcid  [famotidine ] Other (See Comments)   Affects kidneys   Beef (diagnostic) Other (See Comments)   Vegetarian   Chicken Meat (diagnostic) Other (See Comments)   Vegetarian    Food Other (See Comments)   Animal meat product - pt is vegetarian   Pork (diagnostic) Other (See Comments)   Vegetarian         Medication List     STOP taking these medications    amLODipine  10 MG tablet Commonly known as: NORVASC    nystatin  100000 UNIT/ML suspension Commonly known  as: MYCOSTATIN        TAKE these medications    aspirin  EC 81 MG tablet Take 81 mg by mouth daily. Swallow whole.   baclofen  10 MG tablet Commonly known as: LIORESAL  Take 10 mg by mouth in the morning and at bedtime.   buPROPion  150 MG 24 hr tablet Commonly known as: WELLBUTRIN  XL Take 150 mg by mouth in the morning.   fluticasone  50 MCG/ACT nasal spray Commonly known as: FLONASE  Place 2 sprays into both nostrils daily as needed for allergies or rhinitis.   gabapentin  100 MG capsule Commonly known as: NEURONTIN  Take 1 capsule (100 mg total) by mouth 3 (three) times daily.   levETIRAcetam  500 MG tablet Commonly known as: KEPPRA  Take 1,500 mg by mouth in the morning and at bedtime.   melatonin 5 MG Tabs Take 1 tablet (5 mg total) by mouth at bedtime as needed.   multivitamin with minerals Tabs tablet Take 1 tablet by mouth daily.    omeprazole 40 MG capsule Commonly known as: PRILOSEC Take 40 mg by mouth daily before breakfast.   polycarbophil 625 MG tablet Commonly known as: FIBERCON Take 1 tablet (625 mg total) by mouth daily.   polyethylene glycol 17 g packet Commonly known as: MIRALAX  / GLYCOLAX  Take 17 g by mouth 2 (two) times daily. What changed:  when to take this reasons to take this   rosuvastatin  40 MG tablet Commonly known as: CRESTOR  Take 40 mg by mouth at bedtime.   saccharomyces boulardii 250 MG capsule Commonly known as: FLORASTOR Take 1 capsule (250 mg total) by mouth 2 (two) times daily.   sertraline  100 MG tablet Commonly known as: ZOLOFT  Take 200 mg by mouth in the morning.   Theratears 0.25 % Soln Generic drug: Carboxymethylcellulose Sodium Place 1 drop into both eyes 2 (two) times daily as needed (for redness or irritation).   TYLENOL  500 MG tablet Generic drug: acetaminophen  Take 500 mg by mouth See admin instructions. Take 500 mg by mouth at bedtime and an additional 500 mg once a day as needed for pain or headaches   Vitamin D3 50 MCG (2000 UT) Tabs Take 2,000 Units by mouth at bedtime.   Xarelto  20 MG Tabs tablet Generic drug: rivaroxaban  Take 1 tablet (20 mg total) by mouth daily with supper.        Follow-up Information     Victorio Grave, MD Follow up.   Specialty: Family Medicine Why: Call in 1-2 days for post hospital follow up Contact information: [address], [address] [place] [address] [phone]         Genetta Kenning, MD. Call.   Specialty: Physical Medicine and Rehabilitation Why: As needed Contact information: [address] [place] [address] [phone]         Ivor Mars, MD Follow up.   Specialty: Oncology Why: Call in 1-2 days for post hospital follow up Contact information: [address] [place] [address] [phone]                 Signed: Zelda Hickman 08/14/2023, 7:09 PM

## 2023-08-12 NOTE — Progress Notes (Signed)
 Occupational Therapy Session Note  Patient Details  Name: Mark [name] MRN: [id number] Date of Birth: 10/05/1945  Today's Date: 08/12/2023 OT Individual Time: 1305-1400 OT Individual Time Calculation (min): 55 min    Short Term Goals: Week 1:  OT Short Term Goal 1 (Week 1): STGs = LTGs  Skilled Therapeutic Interventions/Progress Updates:     Pt received sitting up in wc with wife, sister, and DTR present in room for family education focused session. Pt presenting to be in good spirits receptive to skilled OT session reporting 0/10 pain- OT offering intermittent rest breaks, repositioning, and therapeutic support to optimize participation in therapy session. Provided education on fall prevention, energy conservation, and simple home modifications to increase Pt's safety and independence. Education provided on technique for donn/doffing AFO with handout issued with written instructions from primary OT- engaged Pt in practicing donn/doffing AFO with family observing to support learning with Pt demonstrating teach back as evidence of learning. Demonstrated technique for donning L padded resting hand splint with education provided on purpose, wear schedule, and importance of performing regular skin checks. Educated on getting an arm skate board for home usage to support improved L UE functional use and promote increase ROM- educated on how/where to purchase and exercises to complete. Recommended Pt's family consider hiring aids to assist with shower transfers and bathing tasks as Pt's wife is living with chronic back injuries and is not able to provide physical assistance without risk of harming herself. Educated on caregiver burnout and importance of having respite care to decrease bourdon of care and support caregiver mental health and safety. Provided education on follow-up therapy recommendations of OPOT. Educated on purchasing a TTB with open seat to allow for increased independence and cleanliness  during bathing tasks with handout issued. Educated on TTB technique with demonstration and practice opportunity provided. Pt able to complete transfer safety with supervision provided for safety. Pt and Pt's family reporting all questions were answered at end of session and Pt's family receptive to all education provided. Pt was left resting in wc with call bell in reach, family present in room, and all needs met.    Therapy Documentation Precautions:  Precautions Precautions: Fall Recall of Precautions/Restrictions: Impaired Precaution/Restrictions Comments: L hemiplegia Required Braces or Orthoses: Other Brace Other Brace: L AFO Restrictions Weight Bearing Restrictions Per Provider Order: No   Therapy/Group: Individual Therapy  Geoffery Kiel 08/12/2023, 8:00 AM

## 2023-08-12 NOTE — Progress Notes (Signed)
 PROGRESS NOTE   Subjective/Complaints:  No issues overnite , wife coming in for family training, has a daughter who teaches at [employer] that can assist at times   ROS: Patient denies CP, SOB, N/V/D Objective:   No results found.  Recent Labs    08/11/23 0533 08/12/23 0510  WBC 9.4 9.9  HGB 9.7* 9.6*  HCT 31.6* 30.2*  PLT 496* 456*    Recent Labs    08/11/23 0533  NA 138  K 4.4  CL 107  CO2 23  GLUCOSE 105*  BUN 20  CREATININE 1.38*  CALCIUM  8.9     Intake/Output Summary (Last 24 hours) at 08/12/2023 0823 Last data filed at 08/12/2023 0715 Gross per 24 hour  Intake 716 ml  Output 2100 ml  Net -1384 ml        Physical Exam: Vital Signs Blood pressure 132/69, pulse 65, temperature 98.7 F (37.1 C), resp. rate 17, height 5\' 10"  (1.778 m), weight 87 kg, SpO2 95%.    General: No acute distress Mood and affect are appropriate Heart: Regular rate and rhythm no rubs murmurs or extra sounds Lungs: Clear to auscultation, breathing unlabored, no rales or wheezes Abdomen: Positive bowel sounds, soft nontender to palpation, nondistended Extremities: No clubbing, cyanosis, or edema Skin: No evidence of breakdown, no evidence of rash   Skin: No evidence of breakdown, no evidence of rash  motor strength is 5/5 in right and  trace left deltoid, bicep, tricep, grip, hip flexor, knee extensors, ankle dorsiflexor and plantar flexor Tone increased left hand lumbricals and wrist flexors as well as Left foot plantar flexors and invertors--no changes today ~2/4,   Cerebellar exam unable to perform on left due to weakness Musculoskeletal: Reduced ROM Left shoulder and hip with tone influence   Assessment/Plan: 1. Functional deficits which require 3+ hours per day of interdisciplinary therapy in a comprehensive inpatient rehab setting. Physiatrist is providing close team supervision and 24 hour management of active  medical problems listed below. Physiatrist and rehab team continue to assess barriers to discharge/monitor patient progress toward functional and medical goals  Care Tool:  Bathing    Body parts bathed by patient: Chest, Abdomen, Front perineal area, Right upper leg, Left upper leg, Right lower leg, Left lower leg, Face, Left arm, Buttocks   Body parts bathed by helper: Right arm     Bathing assist Assist Level: Minimal Assistance - Patient > 75%     Upper Body Dressing/Undressing Upper body dressing   What is the patient wearing?: Pull over shirt    Upper body assist Assist Level: Minimal Assistance - Patient > 75%    Lower Body Dressing/Undressing Lower body dressing      What is the patient wearing?: Underwear/pull up, Pants     Lower body assist Assist for lower body dressing: Moderate Assistance - Patient 50 - 74%     Toileting Toileting    Toileting assist Assist for toileting: Minimal Assistance - Patient > 75%     Transfers Chair/bed transfer  Transfers assist  Chair/bed transfer activity did not occur: Safety/medical concerns  Chair/bed transfer assist level: Minimal Assistance - Patient > 75%  Locomotion Ambulation   Ambulation assist      Assist level: Contact Guard/Touching assist Assistive device: Walker-hemi Max distance: 90   Walk 10 feet activity   Assist     Assist level: Contact Guard/Touching assist Assistive device: Walker-hemi   Walk 50 feet activity   Assist    Assist level: Contact Guard/Touching assist Assistive device: Walker-hemi    Walk 150 feet activity   Assist Walk 150 feet activity did not occur: Safety/medical concerns (fatigue/SOB)         Walk 10 feet on uneven surface  activity   Assist Walk 10 feet on uneven surfaces activity did not occur: Safety/medical concerns (fatigue)         Wheelchair     Assist Is the patient using a wheelchair?: Yes Type of Wheelchair: Manual     Wheelchair assist level: Set up assist, Supervision/Verbal cueing Max wheelchair distance: 132ft    Wheelchair 50 feet with 2 turns activity    Assist        Assist Level: Supervision/Verbal cueing   Wheelchair 150 feet activity     Assist      Assist Level: Supervision/Verbal cueing   Blood pressure 132/69, pulse 65, temperature 98.7 F (37.1 C), resp. rate 17, height 5\' 10"  (1.778 m), weight 87 kg, SpO2 95%.  Medical Problem List and Plan: 1. Functional deficits secondary to debility 2/2 urosepsis and AKI             -patient may shower             -ELOS/Goals: 7 days S             -Continue CIR therapies including PT, OT    -spoke to [facility] about checking size of PRAFO (not marked). May need a size up (?large) 2.  Lupus anticoagulant/Antithrombotics: -DVT/anticoagulation:  Pharmaceutical: Xarelto              -antiplatelet therapy: ASA resumed.   3. Pain Management: tylenol  prn   4. Mood/Behavior/Sleep: LCSW to evaluate and provide support prn.              -antipsychotic agents: N/A             --melatonin prn for insomnia.    5. Neuropsych/cognition: This patient is capable of making decisions on his own behalf.   6. Skin/Wound Care: routine pressure relief measures.    7. Fluids/Electrolytes/Nutrition: Monitor I/O. Check CMET in am.     Latest Ref Rng & Units 08/11/2023    5:33 AM 08/07/2023    6:06 AM 08/06/2023    6:07 AM  BMP  Glucose 70 - 99 mg/dL 132  440  102   BUN 8 - 23 mg/dL 20  23  28    Creatinine 0.61 - 1.24 mg/dL 7.25  3.66  4.40   Sodium 135 - 145 mmol/L 138  137  137   Potassium 3.5 - 5.1 mmol/L 4.4  4.0  3.6   Chloride 98 - 111 mmol/L 107  107  108   CO2 22 - 32 mmol/L 23  19  21    Calcium  8.9 - 10.3 mg/dL 8.9  8.7  8.6     3/47- labs appear generally stable    -push fluids 8. Urosepsis/E coli UTI: Treated with antibiotics thru 04/20- remains afebrile    9. Acute renal failure: resolving. Continue to monitor   4/28 Cr has  ranged from 1.2 to 1.3   -encourage fluids 10. HTN: Monitor BP TID  Vitals:   08/11/23 2005 08/12/23 0443  BP: 131/72 132/69  Pulse: 72 65  Resp: 16 17  Temp: 98.5 F (36.9 C) 98.7 F (37.1 C)  SpO2: 94% 95%      11. Chronic intermittent diarrhea: continue probiotics. Add fiber   12. Neurogenic bladder: Discontinue foley --Resume I/O caths 4-6 hours to keep bladder volumes < 350 cc Seen by Dr Dulcy Gibney as OP , urinary retention   last visit in November - no role for flomax  per Uro   -continue I/O caths 13. Leucocytosis: Resolving from 23.9--> 11.8.    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    Latest Ref Rng & Units 08/12/2023    5:10 AM 08/11/2023    5:33 AM 08/07/2023    6:06 AM  CBC  WBC 4.0 - 10.5 K/uL 9.9  9.4  9.8   Hemoglobin 13.0 - 17.0 g/dL 9.6  9.7  16.1   Hematocrit 39.0 - 52.0 % 30.2  31.6  32.5   Platelets 150 - 400 K/uL 456  496  421       15. Severe Neuropathy BLE w/gait d/o: scheduled for outpatient [facility]-has  sensation at ankle on right but not left, no LT sensation at feet    16. 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  Needs resting hand splint and PRAFO left side, thinks he has these at home , will need wife to bring in  [facility] in room, pt states he does not have PRAFO will order  May benefit from Xeomin injection 400U - can do as OP , discussed that this would not increase functional use of UE, further improvement unlikely    4/28 we discussed tone today. He benefits from it in LLE for mobility   -outpt xeomin for LUE? 17. Seizure d/o: Stable on Keppra  1500 mg BID   18. H/o depression/anxiety: continue Sertraline  and Bupropion .     19.  Abd distention - + stool and gas on KUB but no obstructive signs  Incidental finding of T12 and T11 vertebroplasty ( chart says from 2008)  BM 4/24 will change miralax  to BID and use dulc supp today    -LBM 4/26 20. Anemia:  4/28 -sl drop in Hgb today. No gross blood loss  -check stool for OB, recheck hgb stable    Latest Ref Rng & Units 08/12/2023    5:10 AM 08/11/2023    5:33 AM 08/07/2023    6:06 AM  CBC  WBC 4.0 - 10.5 K/uL 9.9  9.4  9.8   Hemoglobin 13.0 - 17.0 g/dL 9.6  9.7  09.6   Hematocrit 39.0 - 52.0 % 30.2  31.6  32.5   Platelets 150 - 400 K/uL 456  496  421   BUN improved , oral intake 700-975ml fluid per day - so hydration status probably improving and slight drop due to dilution LOS: 6 days A FACE TO FACE EVALUATION WAS PERFORMED  Mark [name] 08/12/2023, 8:23 AM

## 2023-08-12 NOTE — Progress Notes (Signed)
 Occupational Therapy Session Note  Patient Details  Name: Mark [name] MRN: [id number] Date of Birth: 1945-07-27  Today's Date: 08/12/2023 OT Individual Time: 0865-7846 OT Individual Time Calculation (min): 60 min    Short Term Goals: Week 1:  OT Short Term Goal 1 (Week 1): STGs = LTGs  Skilled Therapeutic Interventions/Progress Updates:    Pt received sitting EOB ready for therapy.  Spent the first part of the session problem solving strategies for pt to don his L AFO and shoe.  After a few trials found a method that is fairly efficient for the patient. Wrote down the 4 steps as a reminder for him as he continues to practice it.   Pt taken to tub room to practice ambulating into bathroom with hemiwalker and sitting on edge of bench.  Focused on SLOW turns and ensuring pt's feet are aligned correctly to allow for a safe and smooth controlled descent into sitting. Pt performed transfers extremely well.  Reviewed recommendation for an open seat tub bench to allow him to cleanse his bottom more easily.  Also his wife would benefit from a personal care aide being present 2-3x a week for when pt needs to shower as she has back problems.   Pt taken to gym to show him the arm skate board and have him practice with it to allow him to do his own self range of motion to maintain elbow range and shoulder range. Pt used it and was able to guide [employer] with R hand. Pt can purchase one on [employer] for less than $40.   Pt discussed how he made a mirror box and has used it at home LUE NMR.  Pt returned to room with all needs met.   Therapy Documentation Precautions:  Precautions Precautions: Fall Recall of Precautions/Restrictions: Impaired Precaution/Restrictions Comments: L hemiplegia Required Braces or Orthoses: Other Brace Other Brace: L AFO Restrictions Weight Bearing Restrictions Per Provider Order: No General:   Vital Signs:   Pain:   ADL: ADL Eating: Set up Grooming:  Supervision/safety Upper Body Bathing: Supervision/safety (used long sponge to wash under R arm and R shoulder) Lower Body Bathing: Contact guard, Minimal assistance Upper Body Dressing: Supervision/safety, Contact guard (min CGA to pull shirt down lower part of back.) Where Assessed-Upper Body Dressing: Chair Lower Body Dressing: Minimal assistance, Moderate assistance (able to pull underwear and pants over feet with supervision, min A to pull fully over L hip) Where Assessed-Lower Body Dressing: Chair Toileting: Minimal assistance Toilet Transfer: Minimal assistance Toilet Transfer Method: Ambulating Tub/Shower Transfer: Not assessed Film/video editor: Minimal assistance Film/video editor Method: Designer, industrial/product: Emergency planning/management officer, Other (comment)   Therapy/Group: Individual Therapy  Ngoc Daughtridge 08/12/2023, 12:47 PM

## 2023-08-12 NOTE — Progress Notes (Signed)
 Physical Therapy Session Note  Patient Details  Name: Mark [name] MRN: [id number] Date of Birth: [birth year]  Today's Date: 08/12/2023 PT Individual Time: 4098-1191; 1406 - 1505 PT Individual Time Calculation (min): 25 min; 59 min   Short Term Goals: Week 1:  PT Short Term Goal 1 (Week 1): STG=LTG 2/2 ELOS  SESSION 1 Skilled Therapeutic Interventions/Progress Updates: Patient sitting in WC on entrance to room. Patient alert and agreeable to PT session.   Patient reported no pain at beginning of session. Pt reported tightness in R low back following gait (pt participated in stretch noted below with pt reporting decrease in tightness/pain).  Therapeutic Activity: Transfers: Pt performed sit<>stands with HW with CGA/close supervision and VC to keep HW on R side, and to ensure safe foot placement.   - Pt participated in ambulatory intervention around day room/nsg loop to improve endurance to navigate around house safely. Pt with HW on R (L AFO donned) with overall close supervision for safety. Pt ambulated 145', then needed seated rest break due to reports of tightness/pain in L low back (stretch to follow).  Therapeutic Exercise: Pt performed the following exercises with therapist providing the described cuing and facilitation for improvement. - Pt sitting in WC with demonstration and cuing to flex hips forward and to the L with R UE around chest to lengthen R hip musculature. Pt performed x 10 with 2-3 seconds holds. Pt informed this exercise will be given prior to d/c to continue at home.   Patient sitting in WC at end of session with brakes locked, and all needs within reach.  SESSION 2 Skilled Therapeutic Interventions/Progress Updates: Patient sitting in [facility] with family present on entrance to room. Patient alert and agreeable to PT session.   Patient with no reports of pain during session. Session focused on pt and pt family education. Pt family updated on pt's CLOF and what PT has  consisted of since admission. Family, pt and PTA discussed pros and cons to pt receiving Botox after d/c to assist with decreasing tone in L LE/UE in order to improve overall body mechanics/postural alignment, and to potentially graduate to RW vs HW if following PT deems appropriate in the future. Pt family with further questions regarding Botox with PTA stating that it will be communicated to attending physicians to reach out to pt family to discuss further. Pt family also with concerns about pt ability to navigate ramp at home to make it to OPPT (which is where pt would like to go vs HHPT). Pt stated this will be communicated with LCSW to see if there are any transportation services available to assist pt to car on days that pt is unable to safely descend ramp (has transport chair, but pt wife unable to manage safely, and daughter will be out of town the first 6 weeks after pt d/c home). Pt participated in car transfer to with minA to navigate L LE into car, but pt and family stated that their personal car space has more room, which will allow pt to perform transfer with supervision. Pt encouraged to avoid using door to stabilize, and to instead use handle on top. Pt then ascended/descended ramp x 2 with supervision and only cued to keep HW close to self when descending. Pt required seated rest break. Pt family and pt with no further questions at end of session, and encouraged to see pt's increase in functional mobility.   Patient supine in bed at end of session with brakes locked, family  present, and all needs within reach.       Therapy Documentation Precautions:  Precautions Precautions: Fall Recall of Precautions/Restrictions: Impaired Precaution/Restrictions Comments: L hemiplegia Required Braces or Orthoses: Other Brace Other Brace: L AFO Restrictions Weight Bearing Restrictions Per Provider Order: No   Therapy/Group: Individual Therapy  Carla Rashad PTA 08/12/2023, 12:19 PM

## 2023-08-12 NOTE — Plan of Care (Signed)
  Problem: RH BOWEL ELIMINATION Goal: RH STG MANAGE BOWEL WITH ASSISTANCE Description: STG Manage Bowel with supervision Assistance. Outcome: Progressing   Problem: RH BLADDER ELIMINATION Goal: RH STG MANAGE BLADDER WITH ASSISTANCE Description: STG Manage Bladder With supervision Assistance Outcome: Progressing   Problem: RH SKIN INTEGRITY Goal: RH STG SKIN FREE OF INFECTION/BREAKDOWN Description: Manage skin free of infection/breakdown  with supervision assistance Outcome: Progressing   Problem: RH SAFETY Goal: RH STG ADHERE TO SAFETY PRECAUTIONS W/ASSISTANCE/DEVICE Description: STG Adhere to Safety Precautions With supervision Assistance/Device. Outcome: Progressing   Problem: RH PAIN MANAGEMENT Goal: RH STG PAIN MANAGED AT OR BELOW PT'S PAIN GOAL Description: <4 w/ prns Outcome: Progressing   Problem: RH KNOWLEDGE DEFICIT GENERAL Goal: RH STG INCREASE KNOWLEDGE OF SELF CARE AFTER HOSPITALIZATION Description: Manage increase knowledge of self care after hospitalization with supervision from wife/family using educational materials provided Outcome: Progressing

## 2023-08-12 NOTE — Plan of Care (Signed)
  Problem: Consults Goal: RH GENERAL PATIENT EDUCATION Description: See Patient Education module for education specifics. Outcome: Progressing   Problem: RH BOWEL ELIMINATION Goal: RH STG MANAGE BOWEL WITH ASSISTANCE Description: STG Manage Bowel with supervision Assistance. Outcome: Progressing   Problem: RH BLADDER ELIMINATION Goal: RH STG MANAGE BLADDER WITH ASSISTANCE Description: STG Manage Bladder With supervision Assistance Outcome: Progressing   Problem: RH SAFETY Goal: RH STG ADHERE TO SAFETY PRECAUTIONS W/ASSISTANCE/DEVICE Description: STG Adhere to Safety Precautions With supervision Assistance/Device. Outcome: Progressing   Problem: RH KNOWLEDGE DEFICIT GENERAL Goal: RH STG INCREASE KNOWLEDGE OF SELF CARE AFTER HOSPITALIZATION Description: Manage increase knowledge of self care after hospitalization with supervision from wife/family using educational materials provided Outcome: Progressing

## 2023-08-13 NOTE — Progress Notes (Signed)
 Physical Therapy Discharge Summary  Patient Details  Name: Mark [name] MRN: [id number] Date of Birth: 06-01-1945  Date of Discharge from PT service:August 13, 2023  Patient has met 5 of 5 long term goals due to improved activity tolerance, improved balance, increased strength, and improved coordination.  Patient to discharge at an ambulatory level Supervision.   Patient's care partner is independent to provide the necessary physical assistance at discharge.  Recommendation:  Patient will benefit from ongoing skilled PT services in home health setting to continue to advance safe functional mobility, address ongoing impairments in strength, coordination, balance, activity tolerance, cognition, safety awareness, and to minimize fall risk.  Equipment: No equipment provided  Reasons for discharge: treatment goals met  Patient/family agrees with progress made and goals achieved: Yes  PT Discharge Precautions/Restrictions Precautions Precautions: Fall Precaution/Restrictions Comments: Chronic L hemiparesis Required Braces or Orthoses: Other Brace Other Brace: L AFO Restrictions Weight Bearing Restrictions Per Provider Order: No Pain Pain Assessment Pain Scale: 0-10 Pain Score: 0-No pain Pain Interference Pain Interference Pain Effect on Sleep: 0. Does not apply - I have not had any pain or hurting in the past 5 days Pain Interference with Therapy Activities: 1. Rarely or not at all Pain Interference with Day-to-Day Activities: 1. Rarely or not at all Vision/Perception  Vision - History Ability to See in Adequate Light: 0 Adequate Perception Perception: Impaired Preception Impairment Details: Spatial orientation;Inattention/Neglect Perception-Other Comments: slight left inattention when backing up to sitting surface. He does well with cues to "square up" his feet and hips to sitting surface prior to sitting. Praxis Praxis: WFL  Cognition Overall Cognitive Status: Within  Functional Limits for tasks assessed Arousal/Alertness: Awake/alert Orientation Level: Oriented X4 Sustained Attention: Appears intact Memory: Appears intact Awareness: Appears intact Problem Solving: Appears intact Safety/Judgment: Appears intact Sensation Sensation Light Touch: Appears Intact Additional Comments: decreased sensation along bilateral great toes. Varying plantarflexion and inversion tone in L ankle. 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 - Generalized weakness/deconditioning has improved since evaluation due to demonstration of increased endurance with activity  Mobility Bed Mobility Bed Mobility: Rolling Right;Supine to Sit;Sit to Supine Rolling Right: Independent Rolling Left: Independent Supine to Sit: Independent Sit to Supine: Independent Transfers Transfers: Sit to Stand;Stand to Sit;Stand Pivot Transfers Sit to Stand: Supervision/Verbal cueing Stand to Sit: Supervision/Verbal cueing Stand Pivot Transfers: Supervision/Verbal cueing Transfer (Assistive device): Hemi-walker Locomotion  Gait Ambulation: Yes Gait Assistance: Supervision/Verbal cueing Gait Distance (Feet): 150 Feet Assistive device: Hemi-walker Gait Gait: Yes Gait Pattern: Impaired Gait Pattern: Step-to pattern;Decreased step length - right;Decreased step length - left;Decreased stride length;Decreased dorsiflexion - left;Decreased hip/knee flexion - left;Decreased weight shift to left;Poor foot clearance -  right;Poor foot clearance - left;Narrow base of support;Left flexed knee in stance;Lateral trunk lean to right Gait velocity: decreased Stairs / Additional Locomotion Stairs: No Wheelchair Mobility Wheelchair Mobility: No  Trunk/Postural Assessment  Cervical Assessment Cervical Assessment: Exceptions to [facility] (Forward head with R rotation) Thoracic Assessment Thoracic Assessment: Exceptions to [facility] (rounded shoulders) Lumbar Assessment Lumbar Assessment: Exceptions to [facility] (posterior pelvic tilt) Postural Control Postural  Control: Deficits on evaluation Righting Reactions: delayed on L Protective Responses: delayed on L  Balance Balance Balance Assessed: Yes Static Sitting Balance Static Sitting - Balance Support: Feet supported;No upper extremity supported Static Sitting - Level of Assistance: 7: Independent (Simultaneous filing. User may not have seen previous data.) Dynamic Sitting Balance Dynamic Sitting - Balance Support: Feet supported;No upper extremity supported Dynamic Sitting - Level of Assistance: 5: Stand by assistance (Simultaneous filing. User may not have seen previous data.) Static Standing Balance Static Standing - Balance Support: Right upper extremity supported;During functional activity Static Standing - Level of Assistance: 5: Stand by assistance (Simultaneous filing. User may not have seen previous data.) Dynamic Standing Balance Dynamic Standing - Balance Support: Right upper extremity supported;During functional activity Dynamic Standing - Level of Assistance: 5: Stand by assistance (Simultaneous filing. User may not have seen previous data.) Extremity Assessment  RUE Assessment RUE Assessment: Within Functional Limits LUE Assessment General Strength Comments: unable to use L hand or arm functionally during ADLS due to hypertonicity and hemiplegia (has been at this level for 13 years) RLE Assessment RLE Assessment: Within Functional Limits LLE  Assessment LLE Assessment: Exceptions to [facility] LLE Strength Left Hip Flexion: 4-/5 Left Hip Extension: 4-/5 Left Hip ABduction: 4/5 Left Hip ADduction: 4-/5 Left Knee Flexion: 4/5 Left Knee Extension: 4-/5 Left Ankle Dorsiflexion: 3-/5 Left Ankle Plantar Flexion: 3/5 LLE Tone LLE Tone: Mild;Moderate;Hypertonic  Mark [name] A Mark [name] PT, DPT, CSRS Mark [name] PTA 08/13/2023, 1:00 PM

## 2023-08-13 NOTE — Plan of Care (Signed)
  Problem: RH BOWEL ELIMINATION Goal: RH STG MANAGE BOWEL WITH ASSISTANCE Description: STG Manage Bowel with supervision Assistance. Outcome: Progressing   Problem: RH BLADDER ELIMINATION Goal: RH STG MANAGE BLADDER WITH ASSISTANCE Description: STG Manage Bladder With supervision Assistance Outcome: Progressing   Problem: RH SKIN INTEGRITY Goal: RH STG SKIN FREE OF INFECTION/BREAKDOWN Description: Manage skin free of infection/breakdown  with supervision assistance Outcome: Progressing   Problem: RH SAFETY Goal: RH STG ADHERE TO SAFETY PRECAUTIONS W/ASSISTANCE/DEVICE Description: STG Adhere to Safety Precautions With supervision Assistance/Device. Outcome: Progressing   Problem: RH PAIN MANAGEMENT Goal: RH STG PAIN MANAGED AT OR BELOW PT'S PAIN GOAL Description: <4 w/ prns Outcome: Progressing   Problem: RH KNOWLEDGE DEFICIT GENERAL Goal: RH STG INCREASE KNOWLEDGE OF SELF CARE AFTER HOSPITALIZATION Description: Manage increase knowledge of self care after hospitalization with supervision from wife/family using educational materials provided Outcome: Progressing

## 2023-08-13 NOTE — Progress Notes (Signed)
 Inpatient Rehabilitation Discharge Medication Review by a Pharmacist  A complete drug regimen review was completed for this patient to identify any potential clinically significant medication issues.  High Risk Drug Classes Is patient taking? Indication by Medication  Antipsychotic No   Anticoagulant Yes Rivaroxaban  - hx lupus AC/ hx DVT  Antibiotic No   Opioid No   Antiplatelet Yes Aspirin  - stroke ppx  Hypoglycemics/insulin  No   Vasoactive Medication No   Chemotherapy No   Other Yes Baclofen  - muscle spasms Sertraline , bupropion  - mood Gabapentin  - neuropathy Melatonin - insomnia Prilosec - GERD Fibercon, Florastor, MVI, vitamin D - supplement Rosuvastatin  - HLD Miralax  - constipation Flonase  - allergy     Type of Medication Issue Identified Description of Issue Recommendation(s)  Drug Interaction(s) (clinically significant)     Duplicate Therapy     Allergy     No Medication Administration End Date     Incorrect Dose     Additional Drug Therapy Needed     Significant med changes from prior encounter (inform family/care partners about these prior to discharge).    Other       Clinically significant medication issues were identified that warrant physician communication and completion of prescribed/recommended actions by midnight of the next day:  No  Pharmacist comments: n/a  Time spent performing this drug regimen review (minutes): 20   Ivery Marking, PharmD, [place], AAHIVP, CPP Infectious Disease Pharmacist 08/13/2023 3:01 PM

## 2023-08-13 NOTE — Progress Notes (Signed)
 Patient ID: Mark [name], male   DOB: [birth year], 78 y.o.   MRN: [id number]  [id number] spoke with pt wife to discuss updates from team conference, and d/c date tomorrow. She was shocked about his discharge date being tomorrow, and she did not say she wanted him to leave tomorrow. Her concern is being able to get him into the home,and will need the assistance of their daughter to help get him into the home. She will call her, and follow-up with SW about what she is able to put in place.  *Wife reports she will have assistance to get him in the home tomorrow,and she will arrive here around 2pm.   Norval Been, MSW, LCSW Office: [phone] Cell: [phone] Fax: [phone]

## 2023-08-13 NOTE — Patient Care Conference (Signed)
 Inpatient RehabilitationTeam Conference and Plan of Care Update Date: 08/13/2023   Time: 10:21 AM    Patient Name: Mark [name]      Medical Record Number: [id number]  Date of Birth: June 14, 1945 Sex: Male         Room/Bed: 4W06C/4W06C-01 Payor Info: Payor: [employer] MEDICARE / Plan: [employer] MEDICARE HMO/PPO / Product Type: *No Product type* /    Admit Date/Time:  08/06/2023  4:28 PM  Primary Diagnosis:  Debility  Hospital Problems: Principal Problem:   Debility    Expected Discharge Date: Expected Discharge Date: 08/14/23  Team Members Present: Social Worker Present: Norval Been, LCSWA Nurse Present: Forrestine Ike, RN PT Present: Oma Bias, PT;Dominic Randa Burton, PTA OT Present: Kenda Paula, OT SLP Present: Reggie Caper, SLP PPS Coordinator present : Jestine Moron, SLP     Current Status/Progress Goal Weekly Team Focus  Bowel/Bladder   Pt is continent /incontinent of bowel, In and out cath q4-q6 with bladder scans >319mL   Patient will regain continence   Will assess weekly and daily    Swallow/Nutrition/ Hydration               ADL's   min A overall   Supervision except for CGA with shower transfers   dynamic standing balance, activity tolerance, pt education    Mobility   Bed mobiliity - supervision, transfers with [facility] supervision, gait - 145' with [facility]   Supervision  Barriers: Chronic L hemi (knee flexor and plantarflexor tone); Focus - Pt ed (family ed done), ambulatory endurance, dynamic standing balance, household navigation in ADL apartment    Communication                Safety/Cognition/ Behavioral Observations               Pain   Patient denies pain   Patient will remain pain free   Will assess q shift and PRN    Skin   Skin is intact   Patient will maintain skin integrity  Will assess q shift and PRN      Discharge Planning:  Pt will discharge to home with wife who will be primary caregiver. Family edu on Tuesday  1pm-4pm. SW will confirm there are no barriers to discharge.   Team Discussion: Patient post urosepsis with debility. Limited by chronic spasticity and tone on left side.  Patient on target to meet rehab goals: yes, currently needs CGA - supervision for ADLs  *See Care Plan and progress notes for long and short-term goals.   Revisions to Treatment Plan:  N/a   Teaching Needs: Safety, medications, transfers, toileting, etc.   Current Barriers to Discharge: Decreased caregiver support, Home enviroment access/layout, and Neurogenic bowel and bladder  Possible Resolutions to Barriers: Family education HH follow up services DME: Open seat tub bench     Medical Summary Current Status: Spasticity LLE , chronic, plantar flexors and hamstrings, inversion  Barriers to Discharge: Spasticity   Possible Resolutions to Barriers/Weekly Focus: cont spasticity management coordinate with OP f/u   Continued Need for Acute Rehabilitation Level of Care: The patient requires daily medical management by a physician with specialized training in physical medicine and rehabilitation for the following reasons: Direction of a multidisciplinary physical rehabilitation program to maximize functional independence : Yes Medical management of patient stability for increased activity during participation in an intensive rehabilitation regime.: Yes Analysis of laboratory values and/or radiology reports with any subsequent need for medication adjustment and/or medical intervention. : Yes  I attest that I was present, lead the team conference, and concur with the assessment and plan of the team.   Naoma Bacca 08/13/2023, 2:39 PM

## 2023-08-13 NOTE — Plan of Care (Signed)

## 2023-08-13 NOTE — Progress Notes (Signed)
 PROGRESS NOTE   Subjective/Complaints:  Per OT , wife did well with family training, supervision assist  ROS: Patient denies CP, SOB, N/V/D Objective:   No results found.  Recent Labs    08/11/23 0533 08/12/23 0510  WBC 9.4 9.9  HGB 9.7* 9.6*  HCT 31.6* 30.2*  PLT 496* 456*    Recent Labs    08/11/23 0533  NA 138  K 4.4  CL 107  CO2 23  GLUCOSE 105*  BUN 20  CREATININE 1.38*  CALCIUM  8.9     Intake/Output Summary (Last 24 hours) at 08/13/2023 0859 Last data filed at 08/13/2023 0640 Gross per 24 hour  Intake 472 ml  Output 1750 ml  Net -1278 ml        Physical Exam: Vital Signs Blood pressure (!) 144/85, pulse 66, temperature 98.2 F (36.8 C), temperature source Oral, resp. rate 17, height 5\' 10"  (1.778 m), weight 87 kg, SpO2 97%.    General: No acute distress Mood and affect are appropriate Heart: Regular rate and rhythm no rubs murmurs or extra sounds Lungs: Clear to auscultation, breathing unlabored, no rales or wheezes Abdomen: Positive bowel sounds, soft nontender to palpation, nondistended Extremities: No clubbing, cyanosis, or edema Skin: No evidence of breakdown, no evidence of rash   Skin: No evidence of breakdown, no evidence of rash  motor strength is 5/5 in right and  trace left deltoid, bicep, tricep, grip, hip flexor, knee extensors, ankle dorsiflexor and plantar flexor Tone increased left hand lumbricals and wrist flexors as well as Left foot plantar flexors and invertors--no changes today ~2/4,   Cerebellar exam unable to perform on left due to weakness Musculoskeletal: Reduced ROM Left shoulder and hip with tone influence   Assessment/Plan: 1. Functional deficits which require 3+ hours per day of interdisciplinary therapy in a comprehensive inpatient rehab setting. Physiatrist is providing close team supervision and 24 hour management of active medical problems listed  below. Physiatrist and rehab team continue to assess barriers to discharge/monitor patient progress toward functional and medical goals  Care Tool:  Bathing    Body parts bathed by patient: Chest, Abdomen, Front perineal area, Right upper leg, Left upper leg, Right lower leg, Left lower leg, Face, Left arm, Buttocks   Body parts bathed by helper: Right arm     Bathing assist Assist Level: Minimal Assistance - Patient > 75%     Upper Body Dressing/Undressing Upper body dressing   What is the patient wearing?: Pull over shirt    Upper body assist Assist Level: Minimal Assistance - Patient > 75%    Lower Body Dressing/Undressing Lower body dressing      What is the patient wearing?: Underwear/pull up, Pants     Lower body assist Assist for lower body dressing: Moderate Assistance - Patient 50 - 74%     Toileting Toileting    Toileting assist Assist for toileting: Minimal Assistance - Patient > 75%     Transfers Chair/bed transfer  Transfers assist  Chair/bed transfer activity did not occur: Safety/medical concerns  Chair/bed transfer assist level: Minimal Assistance - Patient > 75%     Locomotion Ambulation   Ambulation assist  Assist level: Contact Guard/Touching assist Assistive device: Walker-hemi Max distance: 90   Walk 10 feet activity   Assist     Assist level: Contact Guard/Touching assist Assistive device: Walker-hemi   Walk 50 feet activity   Assist    Assist level: Contact Guard/Touching assist Assistive device: Walker-hemi    Walk 150 feet activity   Assist Walk 150 feet activity did not occur: Safety/medical concerns (fatigue/SOB)         Walk 10 feet on uneven surface  activity   Assist Walk 10 feet on uneven surfaces activity did not occur: Safety/medical concerns (fatigue)         Wheelchair     Assist Is the patient using a wheelchair?: Yes Type of Wheelchair: Manual    Wheelchair assist level:  Set up assist, Supervision/Verbal cueing Max wheelchair distance: 124ft    Wheelchair 50 feet with 2 turns activity    Assist        Assist Level: Supervision/Verbal cueing   Wheelchair 150 feet activity     Assist      Assist Level: Supervision/Verbal cueing   Blood pressure (!) 144/85, pulse 66, temperature 98.2 F (36.8 C), temperature source Oral, resp. rate 17, height 5\' 10"  (1.778 m), weight 87 kg, SpO2 97%.  Medical Problem List and Plan: 1. Functional deficits secondary to debility 2/2 urosepsis and AKI             -patient may shower             -ELOS/Goals: 7 days S             -Continue CIR therapies including PT, OT    -spoke to [facility] about checking size of PRAFO (not marked). May need a size up (?large) Team conference today please see physician documentation under team conference tab, met with team  to discuss problems,progress, and goals. Formulized individual treatment plan based on medical history, underlying problem and comorbidities.  2.  Lupus anticoagulant/Antithrombotics: -DVT/anticoagulation:  Pharmaceutical: Xarelto              -antiplatelet therapy: ASA resumed.   3. Pain Management: tylenol  prn   4. Mood/Behavior/Sleep: LCSW to evaluate and provide support prn.              -antipsychotic agents: N/A             --melatonin prn for insomnia.    5. Neuropsych/cognition: This patient is capable of making decisions on his own behalf.   6. Skin/Wound Care: routine pressure relief measures.    7. Fluids/Electrolytes/Nutrition: Monitor I/O. Check CMET in am.     Latest Ref Rng & Units 08/11/2023    5:33 AM 08/07/2023    6:06 AM 08/06/2023    6:07 AM  BMP  Glucose 70 - 99 mg/dL 161  096  045   BUN 8 - 23 mg/dL 20  23  28    Creatinine 0.61 - 1.24 mg/dL 4.09  8.11  9.14   Sodium 135 - 145 mmol/L 138  137  137   Potassium 3.5 - 5.1 mmol/L 4.4  4.0  3.6   Chloride 98 - 111 mmol/L 107  107  108   CO2 22 - 32 mmol/L 23  19  21    Calcium  8.9  - 10.3 mg/dL 8.9  8.7  8.6     7/82- labs appear generally stable    -push fluids 8. Urosepsis/E coli UTI: Treated with antibiotics thru 04/20- remains afebrile    9.  Acute renal failure: resolving. Continue to monitor   4/28 Cr has ranged from 1.2 to 1.3   -encourage fluids 10. HTN: Monitor BP TID Vitals:   08/12/23 2057 08/13/23 0630  BP: 134/77 (!) 144/85  Pulse: 74 66  Resp: 17 17  Temp: 98.4 F (36.9 C) 98.2 F (36.8 C)  SpO2: 95% 97%      11. Chronic intermittent diarrhea: continue probiotics. Add fiber   12. Neurogenic bladder: Discontinue foley --Resume I/O caths 4-6 hours to keep bladder volumes < 350 cc Seen by Dr Dulcy Gibney as OP , urinary retention   last visit in November - no role for flomax  per Uro   -continue I/O caths 13. Leucocytosis: Resolving from 23.9--> 11.8.    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    Latest Ref Rng & Units 08/12/2023    5:10 AM 08/11/2023    5:33 AM 08/07/2023    6:06 AM  CBC  WBC 4.0 - 10.5 K/uL 9.9  9.4  9.8   Hemoglobin 13.0 - 17.0 g/dL 9.6  9.7  16.1   Hematocrit 39.0 - 52.0 % 30.2  31.6  32.5   Platelets 150 - 400 K/uL 456  496  421       15. Severe Neuropathy BLE w/gait d/o: scheduled for outpatient [facility]-has  sensation at ankle on right but not left, no LT sensation at feet    16. 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  Needs resting hand splint and PRAFO left side, thinks he has these at home , will need wife to bring in  [facility] in room, pt states he does not have PRAFO will order  May benefit from Xeomin injection 400U - can do as OP , discussed that this would not increase functional use of UE, further improvement unlikely    4/30 we discussed tone today. He benefits from it in LLE for mobility   17. Seizure d/o: Stable on Keppra  1500 mg BID   18. H/o depression/anxiety: continue Sertraline  and  Bupropion .     19.  Abd distention - + stool and gas on KUB but no obstructive signs  Incidental finding of T12 and T11 vertebroplasty ( chart says from 2008)  BM 4/24 will change miralax  to BID and use dulc supp today   -LBM 4/26 20. Anemia:  4/28 -sl drop in Hgb today. No gross blood loss  -check stool for OB, recheck hgb stable    Latest Ref Rng & Units 08/12/2023    5:10 AM 08/11/2023    5:33 AM 08/07/2023    6:06 AM  CBC  WBC 4.0 - 10.5 K/uL 9.9  9.4  9.8   Hemoglobin 13.0 - 17.0 g/dL 9.6  9.7  09.6   Hematocrit 39.0 - 52.0 % 30.2  31.6  32.5   Platelets 150 - 400 K/uL 456  496  421   BUN improved , oral intake ~700 fluid per day - so hydration status probably improving and slight drop due to dilution LOS: 7 days A FACE TO FACE EVALUATION WAS PERFORMED  Mark [name] 08/13/2023, 8:59 AM

## 2023-08-13 NOTE — Progress Notes (Signed)
 Occupational Therapy Session Note  Patient Details  Name: Mark [name] MRN: [id number] Date of Birth: 1946-04-12  Today's Date: 08/13/2023 OT Individual Time: 0915-1000 OT Individual Time Calculation (min): 45 min    Short Term Goals: Week 1:  OT Short Term Goal 1 (Week 1): STGs = LTGs  Skilled Therapeutic Interventions/Progress Updates:    Pt received in bed and worked on sitting to EOB with supervision. Cues to fully bring L hip forward to ensure his hips are in strong alignment to ensure his sitting balance is safe prior to donning footwear.   Pt donned pants with close Supervision needing extra time to fully pull over his Left hip.  Pt donned R shoe without A.  To don AFO, he needed 1 cue on how to line up the shoe/AFO.  Pt then able to don shoe over foot by twisting brace around leg.  He was able to fasten front strap.  Pt then able to stand and ambulate to sink with close Supervision.  Pt engaged in oral care standing at sink.  He ambulated to chair and his L foot tone increased and heel lifted out of shoe.  Adjusted foot and AFO strap.  Discussed his interest in getting involved in peer support group.  Discussed pt going home tomorrow if medical/therapy team in agreement in conference. Pt states he is ready to go home tomorrow.   Pt will plan to shower this afternoon in his longer ADL session.  Pt resting in recliner with all needs met.   Therapy Documentation Precautions:  Precautions Precautions: Fall Recall of Precautions/Restrictions: Impaired Precaution/Restrictions Comments: L hemiplegia Required Braces or Orthoses: Other Brace Other Brace: L AFO Restrictions Weight Bearing Restrictions Per Provider Order: No    Vital Signs: Therapy Vitals Temp: 98 F (36.7 C) Temp Source: Oral Pulse Rate: 67 Resp: 17 BP: 126/65 Patient Position (if appropriate): Lying Oxygen Therapy SpO2: 96 % O2 Device: Room Air Pain: Pain Assessment Pain Scale: 0-10 Pain Score: 0-No  pain   Therapy/Group: Individual Therapy  Zyren Sevigny 08/13/2023, 9:34 AM

## 2023-08-13 NOTE — Progress Notes (Signed)
 Occupational Therapy Session Note  Patient Details  Name: Mark [name] MRN: [id number] Date of Birth: 11-08-1945  Today's Date: 08/13/2023 OT Individual Time: 6063-0160 OT Individual Time Calculation (min): 69 min    Short Term Goals: Week 1:  OT Short Term Goal 1 (Week 1): STGs = LTGs  Skilled Therapeutic Interventions/Progress Updates:     Pt received sitting up in wc presenting to be in good spirits receptive to skilled OT session reporting 0/10 pain- OT offering intermittent rest breaks, repositioning, and therapeutic support to optimize participation in therapy session. Pt requesting to take shower this PM. Focused this session on BADL retraining and functional transfer training with emphasis on safety awareness and increasing overall independence to decrease bourdon of care. Pt propelled w/c into bathroom and set-up for stand pivot transfer with supervision. Stand pivot to toilet using hemi-RW close supervision and 3/3 toileting tasks completed with supervision +increased time following continent void and BM. Pt ambulated to walk-in shower and transferred to padded TTB with close supervision. Once seated in shower, Pt able to complete U/LB bathing with distant supervision completing lateral leans to clean buttocks. Pt dried self following shower in seated position and standing to wash buttock while holding grab bar close supervision- verbal cues required for foot placement prior to standing. Ambulated to room using hemi-RW CGA. U/LB dressing completed sitting in wc and standing to bring pants over buttocks with supervision. Pt able to don bilateral socks, shoes, and L AFO with supervision- min verbal cues required for AFO positioning. Pt with improved safety awareness and application of energy conservation techniques this session. Education provided on PLB'ing and not talking during BADLs d/t Pt becoming SOB with Pt receptive to education and demonstrating teach back as evidence of learning.  Pt was left resting in wc with call bell in reach and all needs met.    Therapy Documentation Precautions:  Precautions Precautions: Fall Recall of Precautions/Restrictions: Impaired Precaution/Restrictions Comments: Chronic L hemiparesis Required Braces or Orthoses: Other Brace Other Brace: L AFO Restrictions Weight Bearing Restrictions Per Provider Order: No   Therapy/Group: Individual Therapy  Geoffery Kiel 08/13/2023, 1:37 PM

## 2023-08-13 NOTE — Progress Notes (Signed)
 Physical Therapy Session Note  Patient Details  Name: Mark [name] MRN: [id number] Date of Birth: July 02, 1945  Today's Date: 08/13/2023 PT Individual Time: 1123-1204 PT Individual Time Calculation (min): 41 min   Short Term Goals: Week 1:  PT Short Term Goal 1 (Week 1): STG=LTG 2/2 ELOS  Skilled Therapeutic Interventions/Progress Updates: Patient sitting in recliner on entrance to room. Patient alert and agreeable to PT session.   Patient reported no pain. Pop up grad day today with pt confident in ability to d/c home tomorrow (family ed already performed). See d/c for summary. HEP provided to assist pt with stretching lower back/hips.  Therapeutic Activity: Transfers: Pt performed sit<>stand transfers throughout session with HW and with supervision.  Access Code: [email or web address] URL: [email or web address] Date: 08/13/2023 Prepared by: Seferino Dade  Exercises - Supine Lower Trunk Rotation  - 1 x daily - 7 x weekly - 3 sets - 10 reps - Seated trunk rotation with forward bend stretch  - 1 x daily - 7 x weekly - 3 sets - 10 reps - 3 - 5 seconds hold  Gait Training:  Pt ambulated 150' using from room<day room with HW with over all supervision and safe management of AD (pt recalled previous cuing of maintaining safe proximity. Step to pattern only due to increased step length on R LE causing tone activation of L plantarflexors/invertors. Increased time required to ambulate distance.  Patient sitting in WC at end of session with brakes locked, and all needs within reach.      Therapy Documentation Precautions:  Precautions Precautions: Fall Recall of Precautions/Restrictions: Impaired Precaution/Restrictions Comments: L hemiplegia Required Braces or Orthoses: Other Brace Other Brace: L AFO Restrictions Weight Bearing Restrictions Per Provider Order: No  Therapy/Group: Individual Therapy  Adain Geurin PTA 08/13/2023, 12:13 PM

## 2023-08-13 NOTE — Progress Notes (Signed)
 Occupational Therapy Discharge Summary  Patient Details  Name: Mark [name] MRN: [id number] Date of Birth: [birth year]  Date of Discharge from OT service:August 13, 2023  Patient has met 8 of 8 long term goals due to improved activity tolerance, improved balance, and ability to compensate for deficits.  Patient to discharge at overall Supervision level.  Patient's care partner is independent to provide the necessary physical assistance at discharge.  Family participated in education with hands on training.   Reasons goals not met: n/a  Recommendation:  Patient will benefit from ongoing skilled OT services in home health setting to continue to advance functional skills in the area of BADL.  Equipment: No equipment provided - pt will benefit from open seat tub bench but will need to purchase on his own  Reasons for discharge: treatment goals met  Patient/family agrees with progress made and goals achieved: Yes  OT Discharge Precautions/Restrictions  Precautions Precautions: Fall Precaution/Restrictions Comments: Chronic L hemiparesis Required Braces or Orthoses: Other Brace Other Brace: L AFO Restrictions Weight Bearing Restrictions Per Provider Order: No   ADL ADL Eating: Independent Grooming: Supervision/safety Where Assessed-Grooming: Standing at sink Upper Body Bathing: Supervision/safety Where Assessed-Upper Body Bathing: Shower Lower Body Bathing: Supervision/safety Where Assessed-Lower Body Bathing: Shower Upper Body Dressing: Supervision/safety Where Assessed-Upper Body Dressing: Chair Lower Body Dressing: Supervision/safety (occasional min A with AFO) Where Assessed-Lower Body Dressing: Chair Toileting: Supervision/safety Toilet Transfer: Close supervision Toilet Transfer Method: Ambulating Tub/Shower Transfer: Scientific laboratory technician Method: Ship broker: Insurance underwriter: Adult nurse Method: Designer, industrial/product: Emergency planning/management officer, Other (comment) Vision Baseline Vision/History: 0 No visual deficits Patient Visual Report: No change from baseline Vision Assessment?: No apparent visual deficits Perception  Perception: Impaired Perception-Other Comments: slight left inattention when backing up to sitting surface. He does well with cues to "square up" his feet and hips to sitting surface prior to sitting. Praxis Praxis: WFL Cognition Cognition Overall Cognitive Status: Within Functional Limits for tasks assessed Arousal/Alertness: Awake/alert Memory: Appears intact Sustained Attention: Appears intact Awareness: Appears intact Problem Solving: Appears intact Safety/Judgment: Appears intact Brief Interview for Mental Status (BIMS) Repetition of Three Words (First Attempt): 3 Temporal Orientation: Year: Correct Temporal Orientation: Month: Accurate within 5 days Temporal Orientation: Day: Correct Recall: "Sock": Yes, no cue required Recall: "Blue": Yes, no cue required Recall: "Bed": Yes, no cue required BIMS Summary Score: 15 Sensation Coordination Gross Motor Movements are Fluid and Coordinated: No Fine Motor Movements are Fluid and Coordinated: No Coordination and Movement Description: grossly uncoordinated due to L hemiparesis, tone in LUE/LLE, and decreased balance/coordination Finger Nose Finger Test: unable to perform on LUE due to prior CVA Motor  Motor Motor: Abnormal tone;Other (comment) (Hemiparesis) Motor - Discharge Observations: L hemiparesis and hypertonia in LUE/LLE due to prior CVA - Generalized weakness/deconditioning has improved since evaluation due to demonstration of increased endurance with activity Mobility  Bed Mobility Bed Mobility: Rolling Right;Supine to Sit;Sit to Supine Rolling Right: Independent Rolling Left: Independent Supine to Sit: Independent Sit to Supine: Independent Transfers Sit to Stand:  Supervision/Verbal cueing Stand to Sit: Supervision/Verbal cueing  Trunk/Postural Assessment  Cervical Assessment Cervical Assessment: Exceptions to [facility] (Forward head with R rotation) Thoracic Assessment Thoracic Assessment: Exceptions to [facility] (rounded shoulders) Lumbar Assessment Lumbar Assessment: Exceptions to [facility] (posterior pelvic tilt) Postural Control Postural Control: Deficits on evaluation Righting Reactions: delayed on L Protective Responses: delayed on L  Balance Balance Balance Assessed: Yes Static Sitting Balance Static  Sitting - Balance Support: Feet supported;No upper extremity supported Static Sitting - Level of Assistance: 7: Independent (Simultaneous filing. User may not have seen previous data.) Dynamic Sitting Balance Dynamic Sitting - Balance Support: Feet supported;No upper extremity supported Dynamic Sitting - Level of Assistance: 5: Stand by assistance (Simultaneous filing. User may not have seen previous data.) Static Standing Balance Static Standing - Balance Support: Right upper extremity supported;During functional activity Static Standing - Level of Assistance: 5: Stand by assistance (Simultaneous filing. User may not have seen previous data.) Dynamic Standing Balance Dynamic Standing - Balance Support: Right upper extremity supported;During functional activity Dynamic Standing - Level of Assistance: 5: Stand by assistance (Simultaneous filing. User may not have seen previous data.) Extremity/Trunk Assessment RUE Assessment RUE Assessment: Within Functional Limits LUE Assessment General Strength Comments: unable to use L hand or arm functionally during ADLS due to hypertonicity and hemiplegia (has been at this level for 13 years)   [facility] 08/13/2023, 12:57 PM

## 2023-08-14 DIAGNOSIS — I69398 Other sequelae of cerebral infarction: Secondary | ICD-10-CM

## 2023-08-14 DIAGNOSIS — G8114 Spastic hemiplegia affecting left nondominant side: Secondary | ICD-10-CM

## 2023-08-14 DIAGNOSIS — R569 Unspecified convulsions: Secondary | ICD-10-CM

## 2023-08-14 LAB — BASIC METABOLIC PANEL WITH GFR
Anion gap: 11 (ref 5–15)
BUN: 22 mg/dL (ref 8–23)
CO2: 23 mmol/L (ref 22–32)
Calcium: 9.1 mg/dL (ref 8.9–10.3)
Chloride: 106 mmol/L (ref 98–111)
Creatinine, Ser: 1.34 mg/dL — ABNORMAL HIGH (ref 0.61–1.24)
GFR, Estimated: 54 mL/min — ABNORMAL LOW (ref >60)
Glucose, Bld: 98 mg/dL (ref 70–99)
Potassium: 3.9 mmol/L (ref 3.5–5.1)
Sodium: 140 mmol/L (ref 135–145)

## 2023-08-14 LAB — CBC
HCT: 35.8 % — ABNORMAL LOW (ref 39.0–52.0)
Hemoglobin: 11 g/dL — ABNORMAL LOW (ref 13.0–17.0)
MCH: 28.9 pg (ref 26.0–34.0)
MCHC: 30.7 g/dL (ref 30.0–36.0)
MCV: 94 fL (ref 80.0–100.0)
Platelets: 387 10*3/uL (ref 150–400)
RBC: 3.81 MIL/uL — ABNORMAL LOW (ref 4.22–5.81)
RDW: 16.4 % — ABNORMAL HIGH (ref 11.5–15.5)
WBC: 8.2 10*3/uL (ref 4.0–10.5)
nRBC: 1.8 % — ABNORMAL HIGH (ref 0.0–0.2)

## 2023-08-14 NOTE — Progress Notes (Signed)
 PA reviewed discharge instructions with patient and significant other. No TOC medicaiton to pick up. No further issues or concerns to report. Left leg dressing changed. Family member taking belongings. Nurse Tech assisting in taking patient down to their car.

## 2023-08-14 NOTE — Plan of Care (Signed)
  Problem: RH Balance Goal: LTG Patient will maintain dynamic standing balance (PT) Description: LTG:  Patient will maintain dynamic standing balance with assistance during mobility activities (PT) Outcome: Completed/Met Flowsheets (Taken 08/07/2023 1254 by Jenney Modest, PT) LTG: Pt will maintain dynamic standing balance during mobility activities with:: Supervision/Verbal cueing   Problem: Sit to Stand Goal: LTG:  Patient will perform sit to stand with assistance level (PT) Description: LTG:  Patient will perform sit to stand with assistance level (PT) Outcome: Completed/Met   Problem: RH Bed Mobility Goal: LTG Patient will perform bed mobility with assist (PT) Description: LTG: Patient will perform bed mobility with assistance, with/without cues (PT). Outcome: Completed/Met   Problem: RH Bed to Chair Transfers Goal: LTG Patient will perform bed/chair transfers w/assist (PT) Description: LTG: Patient will perform bed to chair transfers with assistance (PT). Outcome: Completed/Met   Problem: RH Car Transfers Goal: LTG Patient will perform car transfers with assist (PT) Description: LTG: Patient will perform car transfers with assistance (PT). Outcome: Completed/Met Flowsheets (Taken 08/14/2023 0614) LTG: Pt will perform car transfers with assist:: Supervision/Verbal cueing   Problem: RH Furniture Transfers Goal: LTG Patient will perform furniture transfers w/assist (OT/PT) Description: LTG: Patient will perform furniture transfers  with assistance (OT/PT). Outcome: Completed/Met Flowsheets (Taken 08/07/2023 1254 by Jenney Modest, PT) LTG: Pt will perform furniture transfers with assist:: Supervision/Verbal cueing   Problem: RH Ambulation Goal: LTG Patient will ambulate in controlled environment (PT) Description: LTG: Patient will ambulate in a controlled environment, # of feet with assistance (PT). Outcome: Completed/Met Flowsheets (Taken 08/07/2023 1254 by Jenney Modest,  PT) LTG: Pt will ambulate in controlled environ  assist needed:: Supervision/Verbal cueing LTG: Ambulation distance in controlled environment: 150 feet with LRAD Goal: LTG Patient will ambulate in home environment (PT) Description: LTG: Patient will ambulate in home environment, # of feet with assistance (PT). Outcome: Completed/Met Flowsheets (Taken 08/07/2023 1254 by Jenney Modest, PT) LTG: Pt will ambulate in home environ  assist needed:: Supervision/Verbal cueing LTG: Ambulation distance in home environment: 75 feet with LRAD

## 2023-08-14 NOTE — Progress Notes (Signed)
 PROGRESS NOTE   Subjective/Complaints:  Looking forward to d/c  ROS: Patient denies CP, SOB, N/V/D Objective:   No results found.  Recent Labs    08/12/23 0510  WBC 9.9  HGB 9.6*  HCT 30.2*  PLT 456*    No results for input(s): "NA", "K", "CL", "CO2", "GLUCOSE", "BUN", "CREATININE", "CALCIUM " in the last 72 hours.    Intake/Output Summary (Last 24 hours) at 08/14/2023 0804 Last data filed at 08/14/2023 0130 Gross per 24 hour  Intake 1192 ml  Output 2486 ml  Net -1294 ml        Physical Exam: Vital Signs Blood pressure 130/81, pulse 61, temperature 98 F (36.7 C), temperature source Oral, resp. rate 18, height 5\' 10"  (1.778 m), weight 87 kg, SpO2 97%.    General: No acute distress Mood and affect are appropriate Heart: Regular rate and rhythm no rubs murmurs or extra sounds Lungs: Clear to auscultation, breathing unlabored, no rales or wheezes Abdomen: Positive bowel sounds, soft nontender to palpation, nondistended Extremities: No clubbing, cyanosis, or edema Skin: No evidence of breakdown, no evidence of rash   Skin: No evidence of breakdown, no evidence of rash  motor strength is 5/5 in right and  trace left deltoid, bicep, tricep, grip, hip flexor, knee extensors, ankle dorsiflexor and plantar flexor Tone increased left hand lumbricals and wrist flexors as well as Left foot plantar flexors and invertors--no changes today ~2/4,   Cerebellar exam unable to perform on left due to weakness Musculoskeletal: Reduced ROM Left shoulder and hip with tone influence   Assessment/Plan: 1. Functional deficits which require 3+ hours per day of interdisciplinary therapy in a comprehensive inpatient rehab setting. Physiatrist is providing close team supervision and 24 hour management of active medical problems listed below. Physiatrist and rehab team continue to assess barriers to discharge/monitor patient progress  toward functional and medical goals  Care Tool:  Bathing    Body parts bathed by patient: Chest, Abdomen, Front perineal area, Right upper leg, Left upper leg, Right lower leg, Left lower leg, Face, Left arm, Buttocks, Right arm   Body parts bathed by helper: Right arm     Bathing assist Assist Level: Supervision/Verbal cueing     Upper Body Dressing/Undressing Upper body dressing   What is the patient wearing?: Pull over shirt    Upper body assist Assist Level: Supervision/Verbal cueing    Lower Body Dressing/Undressing Lower body dressing      What is the patient wearing?: Underwear/pull up, Pants     Lower body assist Assist for lower body dressing: Supervision/Verbal cueing     Toileting Toileting    Toileting assist Assist for toileting: Supervision/Verbal cueing     Transfers Chair/bed transfer  Transfers assist  Chair/bed transfer activity did not occur: Safety/medical concerns  Chair/bed transfer assist level: Supervision/Verbal cueing     Locomotion Ambulation   Ambulation assist      Assist level: Supervision/Verbal cueing Assistive device: Walker-hemi Max distance: 150   Walk 10 feet activity   Assist     Assist level: Supervision/Verbal cueing Assistive device: Walker-hemi   Walk 50 feet activity   Assist    Assist level: Supervision/Verbal  cueing Assistive device: Walker-hemi    Walk 150 feet activity   Assist Walk 150 feet activity did not occur: Safety/medical concerns (fatigue/SOB)  Assist level: Supervision/Verbal cueing Assistive device: Walker-hemi    Walk 10 feet on uneven surface  activity   Assist Walk 10 feet on uneven surfaces activity did not occur: Safety/medical concerns (fatigue)   Assist level: Supervision/Verbal cueing Assistive device: Walker-hemi   Wheelchair     Assist Is the patient using a wheelchair?: Yes Type of Wheelchair: Manual    Wheelchair assist level: Set up assist,  Supervision/Verbal cueing Max wheelchair distance: 150    Wheelchair 50 feet with 2 turns activity    Assist        Assist Level: Supervision/Verbal cueing   Wheelchair 150 feet activity     Assist      Assist Level: Supervision/Verbal cueing   Blood pressure 130/81, pulse 61, temperature 98 F (36.7 C), temperature source Oral, resp. rate 18, height 5\' 10"  (1.778 m), weight 87 kg, SpO2 97%.  Medical Problem List and Plan: 1. Functional deficits secondary to debility 2/2 urosepsis and AKI             -patient may shower             -ELOS/Goals: 7 days S             -Continue CIR therapies including PT, OT    -spoke to [facility] about checking size of PRAFO (not marked). May need a size up (?large) Team conference today please see physician documentation under team conference tab, met with team  to discuss problems,progress, and goals. Formulized individual treatment plan based on medical history, underlying problem and comorbidities.  2.  Lupus anticoagulant/Antithrombotics: -DVT/anticoagulation:  Pharmaceutical: Xarelto              -antiplatelet therapy: ASA resumed.   3. Pain Management: tylenol  prn   4. Mood/Behavior/Sleep: LCSW to evaluate and provide support prn.              -antipsychotic agents: N/A             --melatonin prn for insomnia.    5. Neuropsych/cognition: This patient is capable of making decisions on his own behalf.   6. Skin/Wound Care: routine pressure relief measures.    7. Fluids/Electrolytes/Nutrition: Monitor I/O. Check CMET in am.     Latest Ref Rng & Units 08/11/2023    5:33 AM 08/07/2023    6:06 AM 08/06/2023    6:07 AM  BMP  Glucose 70 - 99 mg/dL 478  295  621   BUN 8 - 23 mg/dL 20  23  28    Creatinine 0.61 - 1.24 mg/dL 3.08  6.57  8.46   Sodium 135 - 145 mmol/L 138  137  137   Potassium 3.5 - 5.1 mmol/L 4.4  4.0  3.6   Chloride 98 - 111 mmol/L 107  107  108   CO2 22 - 32 mmol/L 23  19  21    Calcium  8.9 - 10.3 mg/dL 8.9  8.7   8.6     9/62- labs appear generally stable    -push fluids 8. Urosepsis/E coli UTI: Treated with antibiotics thru 04/20- remains afebrile    9. Acute renal failure: resolving. Continue to monitor   4/28 Cr has ranged from 1.2 to 1.3   -encourage fluids 10. HTN: Monitor BP TID Vitals:   08/13/23 1946 08/14/23 0504  BP: (!) 140/87 130/81  Pulse: 71 61  Resp: 18 18  Temp: 98.4 F (36.9 C) 98 F (36.7 C)  SpO2: 96% 97%      11. Chronic intermittent diarrhea: continue probiotics. Add fiber   12. Neurogenic bladder: Discontinue foley --Resume I/O caths 4-6 hours to keep bladder volumes < 350 cc Seen by Dr Dulcy Gibney as OP , urinary retention   last visit in November - no role for flomax  per Uro   -continue I/O caths 13. Leucocytosis: Resolving from 23.9--> 11.8.    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    Latest Ref Rng & Units 08/12/2023    5:10 AM 08/11/2023    5:33 AM 08/07/2023    6:06 AM  CBC  WBC 4.0 - 10.5 K/uL 9.9  9.4  9.8   Hemoglobin 13.0 - 17.0 g/dL 9.6  9.7  16.1   Hematocrit 39.0 - 52.0 % 30.2  31.6  32.5   Platelets 150 - 400 K/uL 456  496  421       15. Severe Neuropathy BLE w/gait d/o: scheduled for outpatient Qutenza-has  sensation at ankle on right but not left, no LT sensation at feet    16. 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  Needs resting hand splint and PRAFO left side, thinks he has these at home , will need wife to bring in  [facility] in room, pt states he does not have PRAFO will order  May benefit from Xeomin injection 400U - can do as OP , discussed that this would not increase functional use of UE, further improvement unlikely    5/1 will discuss toxin injection at f/u appt  UE spasticity mainly in Left FCR and lumbricals 50U each LE spasticity Gastroc 75U, Soleus 75U, Post tib 75U, FDL 50U, FHL 25U   17. Seizure d/o: Stable  on Keppra  1500 mg BID   18. H/o depression/anxiety: continue Sertraline  and Bupropion .     19.  Abd distention - + stool and gas on KUB but no obstructive signs  Incidental finding of T12 and T11 vertebroplasty ( chart says from 2008)  BM 4/24 will change miralax  to BID and use dulc supp today   -LBM 4/26 20. Anemia:  4/28 -sl drop in Hgb today. No gross blood loss  -check stool for OB, recheck hgb stable    Latest Ref Rng & Units 08/12/2023    5:10 AM 08/11/2023    5:33 AM 08/07/2023    6:06 AM  CBC  WBC 4.0 - 10.5 K/uL 9.9  9.4  9.8   Hemoglobin 13.0 - 17.0 g/dL 9.6  9.7  09.6   Hematocrit 39.0 - 52.0 % 30.2  31.6  32.5   Platelets 150 - 400 K/uL 456  496  421    LOS: 8 days A FACE TO FACE EVALUATION WAS PERFORMED  Cecilia Coe Naje Rice 08/14/2023, 8:04 AM

## 2023-08-14 NOTE — Progress Notes (Signed)
 Physical Therapy Session Note  Patient Details  Name: Mark [name] MRN: [id number] Date of Birth: Feb 23, 1946  Today's Date: 4/302025 PT Individual Time: 1516-1610 PT Individual Time Calculation (min): 54 min  Short Term Goals: Week 1:  PT Short Term Goal 1 (Week 1): STG=LTG 2/2 ELOS   Skilled Therapeutic Interventions/Progress Updates:  Patient seated upright in w/c on entrance to room. Patient alert and agreeable to PT session.   Patient with no pain complaint at start of session.  Therapeutic Activity: Transfers: Pt performed sit<>stand and stand pivot transfers throughout session with supervision to [facility]. No cueing required for technique.  While seated in w/c, pt is able to demo ability to doff/ don AFO with MinA for setup and then completes donning including BOA tightening system.  MMT and sensation testing performed - results above.  Gait Training:  Pt ambulated 170 ft using HW with overall supervision. Provided pt with support to LUE with hold in extension and provided with approximation to joints timed for when pt would be applying pressure into a RW during ambulation. Throughout amb bout, pt demos some improvement of reaching midline during ambulation. Pt initially demos weight shift to R side over HW. Did not provide vc for shifting midline.   Patient seated upright in w/c at end of session with brakes locked, no alarm set, and all needs within reach. RN and NT in room to perform bladder scan and in/out cath.    Therapy Documentation Precautions:  Precautions Precautions: Fall Recall of Precautions/Restrictions: Impaired Precaution/Restrictions Comments: Chronic L hemiparesis Required Braces or Orthoses: Other Brace Other Brace: L AFO Restrictions Weight Bearing Restrictions Per Provider Order: No  Pain:  No pain related this session.    Therapy/Group: Individual Therapy  Donne Gage PT, DPT, CSRS 08/13/2023, 6:08 PM

## 2023-08-15 NOTE — Progress Notes (Signed)
 Inpatient Rehabilitation Care Coordinator Discharge Note   Patient Details  Name: Mark [name] MRN: [id number] Date of Birth: [birth year]   Discharge location: d/c to home  Length of Stay: 7 days  Discharge activity level: Supervision  Home/community participation: Limited  Patient response WU:JWJXBJ Literacy - How often do you need to have someone help you when you read instructions, pamphlets, or other written material from your doctor or pharmacy?: Rarely  Patient response YN:WGNFAO Isolation - How often do you feel lonely or isolated from those around you?: Never  Services provided included: MD, RD, PT, OT, RN, CM, Pharmacy, TR, SW, Neuropsych  Financial Services:  Field seismologist Utilized: Private Insurance [employer]  Choices offered to/list presented to: patient and pt wife  Follow-up services arranged:  Home Health [facility] Health Agency: [facility] orders resumed for PT/OT/aide      HH/DME Requested Agency: [facility]  Patient response to transportation need: Is the patient able to respond to transportation needs?: Yes In the past 12 months, has lack of transportation kept you from medical appointments or from getting medications?: No In the past 12 months, has lack of transportation kept you from meetings, work, or from getting things needed for daily living?: No   Patient/Family verbalized understanding of follow-up arrangements:  Yes  Individual responsible for coordination of the follow-up plan: contact pt or pt wife  Confirmed correct DME delivered: Rennis Case 08/15/2023    Comments (or additional information):fam edu completed  Summary of Stay    Date/Time Discharge Planning CSW  08/13/23 1002 Pt will discharge to home with wife who will be primary caregiver. Family edu on Tuesday 1pm-4pm. SW will confirm there are no barriers to discharge. AAC  08/11/23 1039 Pt will discharge to home with wife who will be primary caregiver. Family edu  on Tuesday 1pm-4pm. SW will confirm there are no barriers to discharge. AAC       Deby Adger A Brendolyn Callas

## 2023-08-20 DIAGNOSIS — N390 Urinary tract infection, site not specified: Secondary | ICD-10-CM | POA: Diagnosis not present

## 2023-08-22 DIAGNOSIS — D693 Immune thrombocytopenic purpura: Secondary | ICD-10-CM | POA: Diagnosis not present

## 2023-08-22 DIAGNOSIS — I69359 Hemiplegia and hemiparesis following cerebral infarction affecting unspecified side: Secondary | ICD-10-CM | POA: Diagnosis not present

## 2023-08-22 DIAGNOSIS — N319 Neuromuscular dysfunction of bladder, unspecified: Secondary | ICD-10-CM | POA: Diagnosis not present

## 2023-08-22 DIAGNOSIS — I129 Hypertensive chronic kidney disease with stage 1 through stage 4 chronic kidney disease, or unspecified chronic kidney disease: Secondary | ICD-10-CM | POA: Diagnosis not present

## 2023-08-22 DIAGNOSIS — N390 Urinary tract infection, site not specified: Secondary | ICD-10-CM | POA: Diagnosis not present

## 2023-08-22 DIAGNOSIS — N183 Chronic kidney disease, stage 3 unspecified: Secondary | ICD-10-CM | POA: Diagnosis not present

## 2023-08-22 DIAGNOSIS — G894 Chronic pain syndrome: Secondary | ICD-10-CM | POA: Diagnosis not present

## 2023-08-22 DIAGNOSIS — B0229 Other postherpetic nervous system involvement: Secondary | ICD-10-CM | POA: Diagnosis not present

## 2023-08-23 DIAGNOSIS — D693 Immune thrombocytopenic purpura: Secondary | ICD-10-CM | POA: Diagnosis not present

## 2023-08-23 DIAGNOSIS — I700 Atherosclerosis of aorta: Secondary | ICD-10-CM | POA: Diagnosis not present

## 2023-08-23 DIAGNOSIS — H9190 Unspecified hearing loss, unspecified ear: Secondary | ICD-10-CM | POA: Diagnosis not present

## 2023-08-23 DIAGNOSIS — I129 Hypertensive chronic kidney disease with stage 1 through stage 4 chronic kidney disease, or unspecified chronic kidney disease: Secondary | ICD-10-CM | POA: Diagnosis not present

## 2023-08-23 DIAGNOSIS — F330 Major depressive disorder, recurrent, mild: Secondary | ICD-10-CM | POA: Diagnosis not present

## 2023-08-23 DIAGNOSIS — I2510 Atherosclerotic heart disease of native coronary artery without angina pectoris: Secondary | ICD-10-CM | POA: Diagnosis not present

## 2023-08-23 DIAGNOSIS — G40909 Epilepsy, unspecified, not intractable, without status epilepticus: Secondary | ICD-10-CM | POA: Diagnosis not present

## 2023-08-23 DIAGNOSIS — D68312 Antiphospholipid antibody with hemorrhagic disorder: Secondary | ICD-10-CM | POA: Diagnosis not present

## 2023-08-23 DIAGNOSIS — I69354 Hemiplegia and hemiparesis following cerebral infarction affecting left non-dominant side: Secondary | ICD-10-CM | POA: Diagnosis not present

## 2023-08-23 DIAGNOSIS — D6941 Evans syndrome: Secondary | ICD-10-CM | POA: Diagnosis not present

## 2023-08-23 DIAGNOSIS — N1831 Chronic kidney disease, stage 3a: Secondary | ICD-10-CM | POA: Diagnosis not present

## 2023-08-23 DIAGNOSIS — I252 Old myocardial infarction: Secondary | ICD-10-CM | POA: Diagnosis not present

## 2023-08-26 DIAGNOSIS — N319 Neuromuscular dysfunction of bladder, unspecified: Secondary | ICD-10-CM | POA: Diagnosis not present

## 2023-08-26 DIAGNOSIS — R339 Retention of urine, unspecified: Secondary | ICD-10-CM | POA: Diagnosis not present

## 2023-09-02 ENCOUNTER — Telehealth: Payer: Self-pay

## 2023-09-02 DIAGNOSIS — R8290 Unspecified abnormal findings in urine: Secondary | ICD-10-CM | POA: Diagnosis not present

## 2023-09-02 NOTE — Telephone Encounter (Signed)
 I called patient to get insurance info to do PA on his Qutenza patches and the wife asked if you could call her and discuss Qutenza patches with her.

## 2023-09-03 ENCOUNTER — Encounter: Attending: Physical Medicine and Rehabilitation | Admitting: Physical Medicine and Rehabilitation

## 2023-09-03 DIAGNOSIS — I639 Cerebral infarction, unspecified: Secondary | ICD-10-CM | POA: Insufficient documentation

## 2023-09-05 NOTE — Progress Notes (Signed)
 Called wife to discuss Qutenza but there was no answer

## 2023-09-11 ENCOUNTER — Ambulatory Visit: Admitting: Physical Medicine and Rehabilitation

## 2023-09-15 ENCOUNTER — Other Ambulatory Visit: Payer: Self-pay | Admitting: Hematology & Oncology

## 2023-09-15 ENCOUNTER — Encounter: Attending: Physical Medicine and Rehabilitation | Admitting: Physical Medicine and Rehabilitation

## 2023-09-15 DIAGNOSIS — D693 Immune thrombocytopenic purpura: Secondary | ICD-10-CM

## 2023-09-17 ENCOUNTER — Other Ambulatory Visit: Payer: Self-pay

## 2023-09-17 ENCOUNTER — Telehealth: Payer: Self-pay

## 2023-09-17 DIAGNOSIS — D693 Immune thrombocytopenic purpura: Secondary | ICD-10-CM

## 2023-09-17 NOTE — Telephone Encounter (Signed)
 Received phone call from pt wife stating that Esaw is having another ITP outbreak and he is on his 4th day of decadron . Per wife pt is having bruising but the petechia is resolving.  Pt wife inquiring if she needs to bring him in for labs and evaluation. Reviewed pt wife complaints with Dr. Maria Shiner who wants to see patient in clinic this week.  Scheduling aware and appointments made. Pt wife appreciative of help.

## 2023-09-18 ENCOUNTER — Inpatient Hospital Stay: Admitting: Medical Oncology

## 2023-09-18 ENCOUNTER — Inpatient Hospital Stay

## 2023-09-19 ENCOUNTER — Inpatient Hospital Stay: Admitting: Family

## 2023-09-19 ENCOUNTER — Inpatient Hospital Stay: Attending: Hematology & Oncology

## 2023-09-19 VITALS — BP 123/74 | HR 86 | Temp 97.8°F | Resp 16 | Ht 70.0 in

## 2023-09-19 DIAGNOSIS — R531 Weakness: Secondary | ICD-10-CM | POA: Diagnosis not present

## 2023-09-19 DIAGNOSIS — D693 Immune thrombocytopenic purpura: Secondary | ICD-10-CM

## 2023-09-19 DIAGNOSIS — Z7952 Long term (current) use of systemic steroids: Secondary | ICD-10-CM | POA: Insufficient documentation

## 2023-09-19 DIAGNOSIS — R200 Anesthesia of skin: Secondary | ICD-10-CM | POA: Insufficient documentation

## 2023-09-19 DIAGNOSIS — D6862 Lupus anticoagulant syndrome: Secondary | ICD-10-CM | POA: Insufficient documentation

## 2023-09-19 DIAGNOSIS — Z7901 Long term (current) use of anticoagulants: Secondary | ICD-10-CM | POA: Insufficient documentation

## 2023-09-19 DIAGNOSIS — I69354 Hemiplegia and hemiparesis following cerebral infarction affecting left non-dominant side: Secondary | ICD-10-CM | POA: Insufficient documentation

## 2023-09-19 LAB — CBC WITH DIFFERENTIAL (CANCER CENTER ONLY)
Abs Immature Granulocytes: 0.23 10*3/uL — ABNORMAL HIGH (ref 0.00–0.07)
Basophils Absolute: 0.1 10*3/uL (ref 0.0–0.1)
Basophils Relative: 1 %
Eosinophils Absolute: 0.1 10*3/uL (ref 0.0–0.5)
Eosinophils Relative: 1 %
HCT: 38.4 % — ABNORMAL LOW (ref 39.0–52.0)
Hemoglobin: 12.3 g/dL — ABNORMAL LOW (ref 13.0–17.0)
Immature Granulocytes: 2 %
Lymphocytes Relative: 38 %
Lymphs Abs: 5.6 10*3/uL — ABNORMAL HIGH (ref 0.7–4.0)
MCH: 27.1 pg (ref 26.0–34.0)
MCHC: 32 g/dL (ref 30.0–36.0)
MCV: 84.6 fL (ref 80.0–100.0)
Monocytes Absolute: 2 10*3/uL — ABNORMAL HIGH (ref 0.1–1.0)
Monocytes Relative: 14 %
Neutro Abs: 6.8 10*3/uL (ref 1.7–7.7)
Neutrophils Relative %: 44 %
Platelet Count: 203 10*3/uL (ref 150–400)
RBC: 4.54 MIL/uL (ref 4.22–5.81)
RDW: 15.2 % (ref 11.5–15.5)
WBC Count: 14.8 10*3/uL — ABNORMAL HIGH (ref 4.0–10.5)
nRBC: 0.3 % — ABNORMAL HIGH (ref 0.0–0.2)

## 2023-09-19 LAB — CMP (CANCER CENTER ONLY)
ALT: 150 U/L — ABNORMAL HIGH (ref 0–44)
AST: 114 U/L — ABNORMAL HIGH (ref 15–41)
Albumin: 4.7 g/dL (ref 3.5–5.0)
Alkaline Phosphatase: 63 U/L (ref 38–126)
Anion gap: 13 (ref 5–15)
BUN: 47 mg/dL — ABNORMAL HIGH (ref 8–23)
CO2: 20 mmol/L — ABNORMAL LOW (ref 22–32)
Calcium: 9.4 mg/dL (ref 8.9–10.3)
Chloride: 106 mmol/L (ref 98–111)
Creatinine: 1.79 mg/dL — ABNORMAL HIGH (ref 0.61–1.24)
GFR, Estimated: 38 mL/min — ABNORMAL LOW (ref >60)
Glucose, Bld: 125 mg/dL — ABNORMAL HIGH (ref 70–99)
Potassium: 4.1 mmol/L (ref 3.5–5.1)
Sodium: 139 mmol/L (ref 135–145)
Total Bilirubin: 0.5 mg/dL (ref 0.0–1.2)
Total Protein: 7.7 g/dL (ref 6.5–8.1)

## 2023-09-19 NOTE — Progress Notes (Signed)
 Hematology and Oncology Follow Up Visit  Mark [name] [id number] September 20, 1945 78 y.o. 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 Positive lupus anticoagulant.   Current Therapy:        Xarelto  20 mg PO daily/ EC ASA 81 mg po q day Decadron  40 mg PO daily x 3 days as indicated for ITP   Interim History:  Mark [name] is here today with his wife for follow-up for ITP relapse. Platelets today are looking stable at 203.  He finished his 3 days of Decadron  the day before yesterday. He had started taking for bruising on the arms and legs and petechiae. No known injury to have caused.  His petechiae appears to be almost completely resolved and bruises and small hematoma on the left arm and legs are healing nicely.  No blood loss noted.  No fever, chills, n/v, cough, rash, dizziness, chest pain, palpitations, abdominal pain or changes in bowel or bladder habits.  No swelling noted.  Weakness with numbness on the left side post stroke. He is wearing a left leg brace for support.  He is able to ambulate with a walker and assistance.  No falls or syncope reported.  Appetite and hydration are good.   ECOG Performance Status: 2 - Symptomatic, <50% confined to bed  Medications:  Allergies as of 09/19/2023       Reactions   Benadryl  [diphenhydramine ] Other (See Comments)   Affects kidneys   Pepcid  [famotidine ] Other (See Comments)   Affects kidneys   Beef (diagnostic) Other (See Comments)   Vegetarian   Chicken Meat (diagnostic) Other (See Comments)   Vegetarian    Food Other (See Comments)   Animal meat product - pt is vegetarian   Pork (diagnostic) Other (See Comments)   Vegetarian         Medication List        Accurate as of September 19, 2023  2:42 PM. If you have any questions, ask your nurse or doctor.          STOP taking these medications    polycarbophil 625 MG  tablet Commonly known as: FIBERCON Stopped by: Kennard Pea   rosuvastatin  40 MG tablet Commonly known as: CRESTOR  Stopped by: Kennard Pea   saccharomyces boulardii 250 MG capsule Commonly known as: FLORASTOR Stopped by: Kennard Pea       TAKE these medications    aspirin  EC 81 MG tablet Take 81 mg by mouth daily. Swallow whole.   baclofen  10 MG tablet Commonly known as: LIORESAL  Take 10 mg by mouth in the morning and at bedtime.   buPROPion  150 MG 24 hr tablet Commonly known as: WELLBUTRIN  XL Take 150 mg by mouth in the morning.   cefadroxil  500 MG capsule Commonly known as: DURICEF Take 500 mg by mouth 2 (two) times daily.   dexamethasone  4 MG tablet Commonly known as: DECADRON  TAKE 10 TABLETS BY MOUTH ONCE DAILY AS NEEDED FOR  3  DAYS  FOR  ITP   fluticasone  50 MCG/ACT nasal spray Commonly known as: FLONASE  Place 2 sprays into both nostrils daily as needed for allergies or rhinitis.   gabapentin  100 MG capsule Commonly known as: NEURONTIN  Take 1 capsule (100 mg total) by mouth 3 (three) times daily.   levETIRAcetam  500 MG tablet Commonly known as: KEPPRA  Take 1,500 mg by mouth in the morning and at bedtime.   melatonin 5 MG Tabs Take 1 tablet (5  mg total) by mouth at bedtime as needed.   multivitamin with minerals Tabs tablet Take 1 tablet by mouth daily.   omeprazole 40 MG capsule Commonly known as: PRILOSEC Take 40 mg by mouth daily before breakfast.   polyethylene glycol 17 g packet Commonly known as: MIRALAX  / GLYCOLAX  Take 17 g by mouth 2 (two) times daily.   sertraline  100 MG tablet Commonly known as: ZOLOFT  Take 200 mg by mouth in the morning.   Theratears 0.25 % Soln Generic drug: Carboxymethylcellulose Sodium Place 1 drop into both eyes 2 (two) times daily as needed (for redness or irritation).   TYLENOL  500 MG tablet Generic drug: acetaminophen  Take 500 mg by mouth See admin instructions. Take 500 mg by mouth at bedtime and an  additional 500 mg once a day as needed for pain or headaches   Vitamin D3 50 MCG (2000 UT) Tabs Take 2,000 Units by mouth at bedtime.   Xarelto  20 MG Tabs tablet Generic drug: rivaroxaban  Take 1 tablet (20 mg total) by mouth daily with supper.        Allergies:  Allergies  Allergen Reactions   Benadryl  [Diphenhydramine ] Other (See Comments)    Affects kidneys   Pepcid  [Famotidine ] Other (See Comments)    Affects kidneys   Beef (Diagnostic) Other (See Comments)    Vegetarian   Chicken Meat (Diagnostic) Other (See Comments)    Vegetarian    Food Other (See Comments)    Animal meat product - pt is vegetarian   Pork (Diagnostic) Other (See Comments)    Vegetarian     Past Medical History, Surgical history, Social history, and Family History were reviewed and updated.  Review of Systems: All other 10 point review of systems is negative.   Physical Exam:  height is 5\' 10"  (1.778 m). His oral temperature is 97.8 F (36.6 C). His blood pressure is 123/74 and his pulse is 86. His respiration is 16 and oxygen saturation is 95%.   Wt Readings from Last 3 Encounters:  08/06/23 191 lb 12.8 oz (87 kg)  07/28/23 199 lb 11.8 oz (90.6 kg)  07/22/23 199 lb 11.8 oz (90.6 kg)    Ocular: Sclerae unicteric, pupils equal, round and reactive to light Ear-nose-throat: Oropharynx clear, dentition fair Lymphatic: No cervical or supraclavicular adenopathy Lungs no rales or rhonchi, good excursion bilaterally Heart regular rate and rhythm, no murmur appreciated Abd soft, nontender, positive bowel sounds MSK no focal spinal tenderness, no joint edema Neuro: non-focal, well-oriented, appropriate affect Breasts: Deferred   Lab Results  Component Value Date   WBC 14.8 (H) 09/19/2023   HGB 12.3 (L) 09/19/2023   HCT 38.4 (L) 09/19/2023   MCV 84.6 09/19/2023   PLT 203 09/19/2023   Lab Results  Component Value Date   FERRITIN 1,063 (H) 10/23/2016   IRON 15 (L) 06/30/2020   TIBC 196 (L)  06/30/2020   UIBC 181 06/30/2020   IRONPCTSAT 8 (L) 06/30/2020   Lab Results  Component Value Date   RETICCTPCT 1.5 08/20/2010   RBC 4.54 09/19/2023   RETICCTABS 75.3 08/20/2010   No results found for: "KPAFRELGTCHN", "LAMBDASER", "KAPLAMBRATIO" No results found for: "IGGSERUM", "IGA", "IGMSERUM" No results found for: "TOTALPROTELP", "ALBUMINELP", "A1GS", "A2GS", "BETS", "BETA2SER", "GAMS", "MSPIKE", "SPEI"   Chemistry      Component Value Date/Time   NA 140 08/14/2023 0553   NA 146 (H) 04/09/2017 1405   NA 138 05/09/2016 1321   K 3.9 08/14/2023 0553   K 4.0 04/09/2017 1405  K 4.3 05/09/2016 1321   CL 106 08/14/2023 0553   CL 108 04/09/2017 1405   CO2 23 08/14/2023 0553   CO2 23 04/09/2017 1405   CO2 22 05/09/2016 1321   BUN 22 08/14/2023 0553   BUN 34 (H) 04/09/2017 1405   BUN 36.4 (H) 05/09/2016 1321   CREATININE 1.34 (H) 08/14/2023 0553   CREATININE 1.30 (H) 07/04/2023 1334   CREATININE 1.3 (H) 04/09/2017 1405   CREATININE 1.5 (H) 05/09/2016 1321      Component Value Date/Time   CALCIUM  9.1 08/14/2023 0553   CALCIUM  9.1 04/09/2017 1405   CALCIUM  9.9 05/09/2016 1321   ALKPHOS 49 08/07/2023 0606   ALKPHOS 61 04/09/2017 1405   ALKPHOS 103 05/09/2016 1321   AST 28 08/07/2023 0606   AST 35 07/04/2023 1334   AST 39 (H) 05/09/2016 1321   ALT 26 08/07/2023 0606   ALT 32 07/04/2023 1334   ALT 57 (H) 04/09/2017 1405   ALT 64 (H) 05/09/2016 1321   BILITOT 0.6 08/07/2023 0606   BILITOT 0.4 07/04/2023 1334   BILITOT 0.49 05/09/2016 1321       Impression and Plan: Mr. Monte is a very pleasant 78 yo caucasian gentleman with ITP.  He has occasional relapses for which he gets high-dose Decadron  for 3 days which works well for him.  I discussed his labs and current exam with Dr. Maria Shiner. No intervention at this time. We will consider starting him on treatment with Rituxan  if needed.  Follow-up in 3 weeks.   Kennard Pea, NP 6/6/20252:42 PM

## 2023-09-22 NOTE — Telephone Encounter (Signed)
 Approved on June 5 by [facility] NCPDP [id number] Your request has been approved Effective Date: 09/15/2023 Authorization Expiration Date: 09/14/2024

## 2023-09-26 DIAGNOSIS — N319 Neuromuscular dysfunction of bladder, unspecified: Secondary | ICD-10-CM | POA: Diagnosis not present

## 2023-09-26 DIAGNOSIS — R339 Retention of urine, unspecified: Secondary | ICD-10-CM | POA: Diagnosis not present

## 2023-10-06 DIAGNOSIS — N312 Flaccid neuropathic bladder, not elsewhere classified: Secondary | ICD-10-CM | POA: Diagnosis not present

## 2023-10-06 DIAGNOSIS — R3914 Feeling of incomplete bladder emptying: Secondary | ICD-10-CM | POA: Diagnosis not present

## 2023-10-06 DIAGNOSIS — N401 Enlarged prostate with lower urinary tract symptoms: Secondary | ICD-10-CM | POA: Diagnosis not present

## 2023-10-06 DIAGNOSIS — N3020 Other chronic cystitis without hematuria: Secondary | ICD-10-CM | POA: Diagnosis not present

## 2023-10-07 ENCOUNTER — Telehealth: Payer: Self-pay

## 2023-10-07 DIAGNOSIS — D693 Immune thrombocytopenic purpura: Secondary | ICD-10-CM

## 2023-10-07 NOTE — Telephone Encounter (Signed)
 Patients wife called stating patient has petechiae again and is starting the decadron  40mg  x 3 days per last conversation with Dr.Ennever but wants to know if he needs to do anything else. Discussed with MD, patient to go ahead and start decadron  40mg  and come in for CBC tomorrow and possible Nplate . Scheduling informed.

## 2023-10-07 NOTE — Telephone Encounter (Signed)
 Pt wife states she is unable to bring patient in for lab check, possible injection due to the heat she is unable to lift him to get him in the car. Pt ahs appt Monday, she states she will keep that appt and call if it worsens. MD notified

## 2023-10-07 NOTE — Telephone Encounter (Signed)
  Per wife Mr. Jokerst dentures where reported lost during his hospital visit. She is calling for follow up on the matter. Whom should she contact?   Call back phone [phone].

## 2023-10-13 ENCOUNTER — Inpatient Hospital Stay

## 2023-10-13 ENCOUNTER — Other Ambulatory Visit: Payer: Self-pay

## 2023-10-13 ENCOUNTER — Inpatient Hospital Stay (HOSPITAL_BASED_OUTPATIENT_CLINIC_OR_DEPARTMENT_OTHER): Admitting: Hematology & Oncology

## 2023-10-13 VITALS — BP 142/71 | HR 78 | Temp 97.3°F | Resp 18

## 2023-10-13 DIAGNOSIS — I69354 Hemiplegia and hemiparesis following cerebral infarction affecting left non-dominant side: Secondary | ICD-10-CM | POA: Diagnosis not present

## 2023-10-13 DIAGNOSIS — D693 Immune thrombocytopenic purpura: Secondary | ICD-10-CM

## 2023-10-13 DIAGNOSIS — Z7901 Long term (current) use of anticoagulants: Secondary | ICD-10-CM | POA: Diagnosis not present

## 2023-10-13 DIAGNOSIS — D6862 Lupus anticoagulant syndrome: Secondary | ICD-10-CM | POA: Diagnosis not present

## 2023-10-13 DIAGNOSIS — R531 Weakness: Secondary | ICD-10-CM | POA: Diagnosis not present

## 2023-10-13 DIAGNOSIS — I639 Cerebral infarction, unspecified: Secondary | ICD-10-CM

## 2023-10-13 DIAGNOSIS — Z7952 Long term (current) use of systemic steroids: Secondary | ICD-10-CM | POA: Diagnosis not present

## 2023-10-13 DIAGNOSIS — R200 Anesthesia of skin: Secondary | ICD-10-CM | POA: Diagnosis not present

## 2023-10-13 LAB — CMP (CANCER CENTER ONLY)
ALT: 91 U/L — ABNORMAL HIGH (ref 0–44)
AST: 54 U/L — ABNORMAL HIGH (ref 15–41)
Albumin: 4.4 g/dL (ref 3.5–5.0)
Alkaline Phosphatase: 62 U/L (ref 38–126)
Anion gap: 10 (ref 5–15)
BUN: 33 mg/dL — ABNORMAL HIGH (ref 8–23)
CO2: 22 mmol/L (ref 22–32)
Calcium: 9.3 mg/dL (ref 8.9–10.3)
Chloride: 105 mmol/L (ref 98–111)
Creatinine: 1.39 mg/dL — ABNORMAL HIGH (ref 0.61–1.24)
GFR, Estimated: 52 mL/min — ABNORMAL LOW (ref >60)
Glucose, Bld: 136 mg/dL — ABNORMAL HIGH (ref 70–99)
Potassium: 4.2 mmol/L (ref 3.5–5.1)
Sodium: 137 mmol/L (ref 135–145)
Total Bilirubin: 0.5 mg/dL (ref 0.0–1.2)
Total Protein: 7.2 g/dL (ref 6.5–8.1)

## 2023-10-13 LAB — CBC WITH DIFFERENTIAL (CANCER CENTER ONLY)
Abs Immature Granulocytes: 0.11 10*3/uL — ABNORMAL HIGH (ref 0.00–0.07)
Basophils Absolute: 0 10*3/uL (ref 0.0–0.1)
Basophils Relative: 0 %
Eosinophils Absolute: 0.6 10*3/uL — ABNORMAL HIGH (ref 0.0–0.5)
Eosinophils Relative: 6 %
HCT: 37.4 % — ABNORMAL LOW (ref 39.0–52.0)
Hemoglobin: 12 g/dL — ABNORMAL LOW (ref 13.0–17.0)
Immature Granulocytes: 1 %
Lymphocytes Relative: 37 %
Lymphs Abs: 3.6 10*3/uL (ref 0.7–4.0)
MCH: 26.8 pg (ref 26.0–34.0)
MCHC: 32.1 g/dL (ref 30.0–36.0)
MCV: 83.5 fL (ref 80.0–100.0)
Monocytes Absolute: 1.1 10*3/uL — ABNORMAL HIGH (ref 0.1–1.0)
Monocytes Relative: 12 %
Neutro Abs: 4.2 10*3/uL (ref 1.7–7.7)
Neutrophils Relative %: 44 %
Platelet Count: 320 10*3/uL (ref 150–400)
RBC: 4.48 MIL/uL (ref 4.22–5.81)
RDW: 15.9 % — ABNORMAL HIGH (ref 11.5–15.5)
WBC Count: 9.6 10*3/uL (ref 4.0–10.5)
nRBC: 0 % (ref 0.0–0.2)

## 2023-10-13 LAB — SAVE SMEAR(SSMR), FOR PROVIDER SLIDE REVIEW

## 2023-10-13 MED ORDER — DEXAMETHASONE 4 MG PO TABS: ORAL_TABLET | ORAL | 6 refills | Status: AC

## 2023-10-13 NOTE — Progress Notes (Signed)
 Patient's wife asked to speak with a social worker because it's getting harder for her to take care of her husband by herself. Referral placed.

## 2023-10-13 NOTE — Progress Notes (Signed)
 Hematology and Oncology Follow Up Visit  Mark [name] [id number] [birth year] 78 y.o. 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 Positive lupus anticoagulant.   Current Therapy:        Xarelto  20 mg PO daily/ EC ASA 81 mg po q day Decadron  40 mg PO daily x 3 days as indicated for ITP   Interim History:  Mark [name] is here today with his wife for an early follow-up.  Apparently, a week ago, he began to have some petechia as of extra ecchymoses.  She worries that his platelet count is low again.  As such, she started him back on Decadron .  He was as well with Decadron  to 40 mg a day for 3 days.  Unfortunately, she cannot bring him in so we could check his platelet count at that time.  He is doing well.  He apparently has some sweats last night.  This may have been from the Decadron  being stopped.  His blood sugars also may have been a little on the lower side.  He has had no mouth sores.  He has had no problems with bleeding.  There is no hematuria.  There was no melena or bright red blood per rectum.  There is no epistaxis.  He had no gingival bleeding.  His appetite has been fairly good.  Of note, he does continue on Xarelto  and baby aspirin  because he actually has a positive lupus anticoagulant and is hypercoagulable.  Overall, I would say that his performance status is probably ECOG 1.    Medications:  Allergies as of 10/13/2023       Reactions   Benadryl  [diphenhydramine ] Other (See Comments)   Affects kidneys   Pepcid  [famotidine ] Other (See Comments)   Affects kidneys   Beef (diagnostic) Other (See Comments)   Vegetarian   Chicken Meat (diagnostic) Other (See Comments)   Vegetarian    Food Other (See Comments)   Animal meat product - pt is vegetarian   Pork (diagnostic) Other (See Comments)   Vegetarian         Medication List        Accurate as of  October 13, 2023  3:37 PM. If you have any questions, ask your nurse or doctor.          aspirin  EC 81 MG tablet Take 81 mg by mouth daily. Swallow whole.   baclofen  10 MG tablet Commonly known as: LIORESAL  Take 10 mg by mouth in the morning and at bedtime.   buPROPion  150 MG 24 hr tablet Commonly known as: WELLBUTRIN  XL Take 150 mg by mouth in the morning.   cefadroxil  500 MG capsule Commonly known as: DURICEF Take 500 mg by mouth 2 (two) times daily.   dexamethasone  4 MG tablet Commonly known as: DECADRON  TAKE 10 TABLETS BY MOUTH ONCE DAILY AS NEEDED FOR  3  DAYS  FOR  ITP   fluticasone  50 MCG/ACT nasal spray Commonly known as: FLONASE  Place 2 sprays into both nostrils daily as needed for allergies or rhinitis.   gabapentin  100 MG capsule Commonly known as: NEURONTIN  Take 1 capsule (100 mg total) by mouth 3 (three) times daily.   levETIRAcetam  500 MG tablet Commonly known as: KEPPRA  Take 1,500 mg by mouth in the morning and at bedtime.   melatonin 5 MG Tabs Take 1 tablet (5 mg total) by mouth at bedtime as needed.   multivitamin with minerals Tabs tablet Take  1 tablet by mouth daily.   omeprazole 40 MG capsule Commonly known as: PRILOSEC Take 40 mg by mouth daily before breakfast.   polyethylene glycol 17 g packet Commonly known as: MIRALAX  / GLYCOLAX  Take 17 g by mouth 2 (two) times daily.   sertraline  100 MG tablet Commonly known as: ZOLOFT  Take 200 mg by mouth in the morning.   Theratears 0.25 % Soln Generic drug: Carboxymethylcellulose Sodium Place 1 drop into both eyes 2 (two) times daily as needed (for redness or irritation).   TYLENOL  500 MG tablet Generic drug: acetaminophen  Take 500 mg by mouth See admin instructions. Take 500 mg by mouth at bedtime and an additional 500 mg once a day as needed for pain or headaches   Vitamin D3 50 MCG (2000 UT) Tabs Take 2,000 Units by mouth at bedtime.   Xarelto  20 MG Tabs tablet Generic drug:  rivaroxaban  Take 1 tablet (20 mg total) by mouth daily with supper.        Allergies:  Allergies  Allergen Reactions   Benadryl  [Diphenhydramine ] Other (See Comments)    Affects kidneys   Pepcid  [Famotidine ] Other (See Comments)    Affects kidneys   Beef (Diagnostic) Other (See Comments)    Vegetarian   Chicken Meat (Diagnostic) Other (See Comments)    Vegetarian    Food Other (See Comments)    Animal meat product - pt is vegetarian   Pork (Diagnostic) Other (See Comments)    Vegetarian     Past Medical History, Surgical history, Social history, and Family History were reviewed and updated.  Review of Systems: Review of Systems  Constitutional: Negative.   HENT: Negative.    Eyes: Negative.   Respiratory: Negative.    Cardiovascular: Negative.   Gastrointestinal: Negative.   Genitourinary: Negative.   Musculoskeletal: Negative.   Skin:  Positive for rash.  Neurological: Negative.   Endo/Heme/Allergies:  Bruises/bleeds easily.  Psychiatric/Behavioral: Negative.       Physical Exam:  oral temperature is 97.3 F (36.3 C) (abnormal). His blood pressure is 142/71 (abnormal) and his pulse is 78. His respiration is 18 and oxygen saturation is 96%.   Wt Readings from Last 3 Encounters:  08/06/23 191 lb 12.8 oz (87 kg)  07/28/23 199 lb 11.8 oz (90.6 kg)  07/22/23 199 lb 11.8 oz (90.6 kg)    Physical Exam Vitals reviewed.  HENT:     Head: Normocephalic and atraumatic.   Eyes:     Pupils: Pupils are equal, round, and reactive to light.    Cardiovascular:     Rate and Rhythm: Normal rate and regular rhythm.     Heart sounds: Normal heart sounds.  Pulmonary:     Effort: Pulmonary effort is normal.     Breath sounds: Normal breath sounds.  Abdominal:     General: Bowel sounds are normal.     Palpations: Abdomen is soft.   Musculoskeletal:        General: No tenderness or deformity. Normal range of motion.     Cervical back: Normal range of motion.   Lymphadenopathy:     Cervical: No cervical adenopathy.   Skin:    General: Skin is warm and dry.     Findings: No erythema or rash.     Comments: He does have some scattered ecchymoses on his body.   Neurological:     Mental Status: He is alert and oriented to person, place, and time.   Psychiatric:  Behavior: Behavior normal.        Thought Content: Thought content normal.        Judgment: Judgment normal.      Lab Results  Component Value Date   WBC 9.6 10/13/2023   HGB 12.0 (L) 10/13/2023   HCT 37.4 (L) 10/13/2023   MCV 83.5 10/13/2023   PLT 320 10/13/2023   Lab Results  Component Value Date   FERRITIN 1,063 (H) 10/23/2016   IRON 15 (L) 06/30/2020   TIBC 196 (L) 06/30/2020   UIBC 181 06/30/2020   IRONPCTSAT 8 (L) 06/30/2020   Lab Results  Component Value Date   RETICCTPCT 1.5 08/20/2010   RBC 4.48 10/13/2023   RETICCTABS 75.3 08/20/2010   No results found for: KPAFRELGTCHN, LAMBDASER, KAPLAMBRATIO No results found for: IGGSERUM, IGA, IGMSERUM No results found for: STEPHANY CARLOTA BENSON MARKEL EARLA JOANNIE DOC VICK, SPEI   Chemistry      Component Value Date/Time   NA 139 09/19/2023 1412   NA 146 (H) 04/09/2017 1405   NA 138 05/09/2016 1321   K 4.1 09/19/2023 1412   K 4.0 04/09/2017 1405   K 4.3 05/09/2016 1321   CL 106 09/19/2023 1412   CL 108 04/09/2017 1405   CO2 20 (L) 09/19/2023 1412   CO2 23 04/09/2017 1405   CO2 22 05/09/2016 1321   BUN 47 (H) 09/19/2023 1412   BUN 34 (H) 04/09/2017 1405   BUN 36.4 (H) 05/09/2016 1321   CREATININE 1.79 (H) 09/19/2023 1412   CREATININE 1.3 (H) 04/09/2017 1405   CREATININE 1.5 (H) 05/09/2016 1321      Component Value Date/Time   CALCIUM  9.4 09/19/2023 1412   CALCIUM  9.1 04/09/2017 1405   CALCIUM  9.9 05/09/2016 1321   ALKPHOS 63 09/19/2023 1412   ALKPHOS 61 04/09/2017 1405   ALKPHOS 103 05/09/2016 1321   AST 114 (H) 09/19/2023 1412   AST 39 (H)  05/09/2016 1321   ALT 150 (H) 09/19/2023 1412   ALT 57 (H) 04/09/2017 1405   ALT 64 (H) 05/09/2016 1321   BILITOT 0.5 09/19/2023 1412   BILITOT 0.49 05/09/2016 1321       Impression and Plan: Mr. Cordner is a very pleasant 78 yo caucasian gentleman with ITP.  He has occasional relapses for which he gets high-dose Decadron  for 3 days which works well for him.   For right now, everything looks pretty good.  His platelet count certainly fine.  I looked at his blood smear under the microscope.  I do not see anything that looked suspicious.  At this point, we will just plan to have him come back to see us  in about a month or so.  If there are any issues before then, his wife certainly knows what to do.   Maude JONELLE Crease, MD 6/30/20253:37 PM

## 2023-10-14 ENCOUNTER — Inpatient Hospital Stay: Attending: Hematology & Oncology

## 2023-10-14 ENCOUNTER — Telehealth: Payer: Self-pay

## 2023-10-14 NOTE — Telephone Encounter (Signed)
 Clinical Social Work attempted to contact patient's wife, [name], by phone to follow up regarding home care.  Left voicemail with contact information and request for return call.

## 2023-10-14 NOTE — Progress Notes (Signed)
 [facility] Clinical Social Work  Clinical Social Work was referred by Engineer, civil (consulting) for need for [employer].  Clinical Social Worker contacted caregiver by phone to offer support and assess for needs.    Patient's wife, [name], was unable to talk due to caring for her husband.  She said she had been up all night with him.  She requested a call back later today to discuss additional care for him.    Follow Up Plan:  CSW will follow-up with patient by phone     Macario CHRISTELLA Au, LCSW  Clinical Social Worker [facility]

## 2023-10-15 ENCOUNTER — Telehealth: Payer: Self-pay

## 2023-10-15 NOTE — Telephone Encounter (Signed)
 Clinical Social Worker attempted to contact patient's wife, [name], to discuss care issues.  CSW left a voicemail with contact information.

## 2023-10-15 NOTE — Telephone Encounter (Signed)
 Call from patients wife asking if it was okay for pateint to take keflex prescribed by urologist with his ITP. Called patients wife back and informed her per MD okay to take keflex

## 2023-10-18 ENCOUNTER — Other Ambulatory Visit: Payer: Self-pay | Admitting: Urology

## 2023-10-18 MED ORDER — CEPHALEXIN 500 MG PO CAPS: 500.0000 mg | ORAL_CAPSULE | Freq: Two times a day (BID) | ORAL | 0 refills | Status: DC

## 2023-10-18 NOTE — Progress Notes (Signed)
 UROLOGY TELEPHONE NOTE  Wife calling for husband, rUTIs managed by [facility].  She is adamant he is getting another UTI, purulent urine, cannot transport to urgent care/ER.  We reviewed risks of antibiotics resistance, UTI symptoms, etc. She was extremely adamant about starting antibiotics. Difficult situation.   -Started keflex  500mg  BID X 10 days, has appt with pcp this week and recommend urine culture   Redell Burnet, MD 10/18/2023

## 2023-10-22 DIAGNOSIS — I129 Hypertensive chronic kidney disease with stage 1 through stage 4 chronic kidney disease, or unspecified chronic kidney disease: Secondary | ICD-10-CM | POA: Diagnosis not present

## 2023-10-22 DIAGNOSIS — N1831 Chronic kidney disease, stage 3a: Secondary | ICD-10-CM | POA: Diagnosis not present

## 2023-10-22 DIAGNOSIS — F330 Major depressive disorder, recurrent, mild: Secondary | ICD-10-CM | POA: Diagnosis not present

## 2023-10-22 DIAGNOSIS — D693 Immune thrombocytopenic purpura: Secondary | ICD-10-CM | POA: Diagnosis not present

## 2023-10-27 ENCOUNTER — Inpatient Hospital Stay

## 2023-10-27 NOTE — Progress Notes (Signed)
 [facility] CSW Progress Note  Clinical Child psychotherapist contacted caregiver by phone to follow-up on need for community resources.    Interventions: Referred patient to community resources: for home care.  Also made referral for two free house cleanings.  Discussed exploring churches and schools for possible assistance with her husband.  She said a friend is going to stay with him while [name] can go to the doctor.       Follow Up Plan:  CSW will follow-up with patient by phone     Macario CHRISTELLA Au, LCSW Clinical Social Worker [facility]

## 2023-11-14 ENCOUNTER — Inpatient Hospital Stay (HOSPITAL_BASED_OUTPATIENT_CLINIC_OR_DEPARTMENT_OTHER): Admitting: Hematology & Oncology

## 2023-11-14 ENCOUNTER — Encounter: Payer: Self-pay | Admitting: Hematology & Oncology

## 2023-11-14 ENCOUNTER — Inpatient Hospital Stay: Attending: Hematology & Oncology

## 2023-11-14 VITALS — BP 123/74 | HR 77 | Temp 98.4°F | Resp 18 | Ht 70.0 in

## 2023-11-14 DIAGNOSIS — D693 Immune thrombocytopenic purpura: Secondary | ICD-10-CM

## 2023-11-14 DIAGNOSIS — D6862 Lupus anticoagulant syndrome: Secondary | ICD-10-CM | POA: Insufficient documentation

## 2023-11-14 LAB — CBC WITH DIFFERENTIAL (CANCER CENTER ONLY)
Abs Immature Granulocytes: 0.08 K/uL — ABNORMAL HIGH (ref 0.00–0.07)
Basophils Absolute: 0.1 K/uL (ref 0.0–0.1)
Basophils Relative: 1 %
Eosinophils Absolute: 0.4 K/uL (ref 0.0–0.5)
Eosinophils Relative: 3 %
HCT: 40 % (ref 39.0–52.0)
Hemoglobin: 12.2 g/dL — ABNORMAL LOW (ref 13.0–17.0)
Immature Granulocytes: 1 %
Lymphocytes Relative: 28 %
Lymphs Abs: 3.2 K/uL (ref 0.7–4.0)
MCH: 25.6 pg — ABNORMAL LOW (ref 26.0–34.0)
MCHC: 30.5 g/dL (ref 30.0–36.0)
MCV: 83.9 fL (ref 80.0–100.0)
Monocytes Absolute: 1.6 K/uL — ABNORMAL HIGH (ref 0.1–1.0)
Monocytes Relative: 14 %
Neutro Abs: 6.4 K/uL (ref 1.7–7.7)
Neutrophils Relative %: 53 %
Platelet Count: 189 K/uL (ref 150–400)
RBC: 4.77 MIL/uL (ref 4.22–5.81)
RDW: 18 % — ABNORMAL HIGH (ref 11.5–15.5)
WBC Count: 11.7 K/uL — ABNORMAL HIGH (ref 4.0–10.5)
nRBC: 0.3 % — ABNORMAL HIGH (ref 0.0–0.2)

## 2023-11-14 LAB — CMP (CANCER CENTER ONLY)
ALT: 49 U/L — ABNORMAL HIGH (ref 0–44)
AST: 43 U/L — ABNORMAL HIGH (ref 15–41)
Albumin: 4.5 g/dL (ref 3.5–5.0)
Alkaline Phosphatase: 78 U/L (ref 38–126)
Anion gap: 15 (ref 5–15)
BUN: 27 mg/dL — ABNORMAL HIGH (ref 8–23)
CO2: 21 mmol/L — ABNORMAL LOW (ref 22–32)
Calcium: 9.9 mg/dL (ref 8.9–10.3)
Chloride: 105 mmol/L (ref 98–111)
Creatinine: 1.37 mg/dL — ABNORMAL HIGH (ref 0.61–1.24)
GFR, Estimated: 53 mL/min — ABNORMAL LOW (ref >60)
Glucose, Bld: 94 mg/dL (ref 70–99)
Potassium: 4.1 mmol/L (ref 3.5–5.1)
Sodium: 141 mmol/L (ref 135–145)
Total Bilirubin: 0.3 mg/dL (ref 0.0–1.2)
Total Protein: 7.8 g/dL (ref 6.5–8.1)

## 2023-11-14 LAB — SAVE SMEAR(SSMR), FOR PROVIDER SLIDE REVIEW

## 2023-11-14 NOTE — Progress Notes (Signed)
 Hematology and Oncology Follow Up Visit  Mark [name] [id number] 02-09-1946 78 y.o. 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 Positive lupus anticoagulant.   Current Therapy:        Xarelto  20 mg PO daily/ EC ASA 81 mg po q day Decadron  40 mg PO daily x 3 days as indicated for ITP   Interim History:  Mark [name] is here today with one of his children.  He is doing pretty well.  He has had some episodes where he has had some rash and some bruising.  When that happens, we just get him on some pulsed Decadron  and this seems to work well for him.  He has had no problems with nausea or vomiting.  He has had no bleeding.  He has had no change in bowel or bladder habits.  He has had no cough or shortness of breath.  He has had a good summer so far.  His appetite is quite good.  He has had no headache.  Overall, I would have said that his performance status is probably ECOG 2.    Medications:  Allergies as of 11/14/2023       Reactions   Benadryl  [diphenhydramine ] Other (See Comments)   Affects kidneys   Pepcid  [famotidine ] Other (See Comments)   Affects kidneys   Beef (diagnostic) Other (See Comments)   Vegetarian   Chicken Meat (diagnostic) Other (See Comments)   Vegetarian    Food Other (See Comments)   Animal meat product - pt is vegetarian   Pork (diagnostic) Other (See Comments)   Vegetarian         Medication List        Accurate as of November 14, 2023  3:50 PM. If you have any questions, ask your nurse or doctor.          STOP taking these medications    cephALEXin  500 MG capsule Commonly known as: KEFLEX  Stopped by: Maude JONELLE Crease       TAKE these medications    aspirin  EC 81 MG tablet Take 81 mg by mouth daily. Swallow whole.   baclofen  10 MG tablet Commonly known as: LIORESAL  Take 10 mg by mouth in the morning and at bedtime.    buPROPion  150 MG 24 hr tablet Commonly known as: WELLBUTRIN  XL Take 150 mg by mouth in the morning.   cefadroxil  500 MG capsule Commonly known as: DURICEF Take 500 mg by mouth 2 (two) times daily.   dexamethasone  4 MG tablet Commonly known as: DECADRON  TAKE 10 TABLETS BY MOUTH ONCE DAILY AS NEEDED FOR  3  DAYS  FOR  ITP   fluticasone  50 MCG/ACT nasal spray Commonly known as: FLONASE  Place 2 sprays into both nostrils daily as needed for allergies or rhinitis.   gabapentin  100 MG capsule Commonly known as: NEURONTIN  Take 1 capsule (100 mg total) by mouth 3 (three) times daily.   levETIRAcetam  500 MG tablet Commonly known as: KEPPRA  Take 1,500 mg by mouth in the morning and at bedtime.   melatonin 5 MG Tabs Take 1 tablet (5 mg total) by mouth at bedtime as needed.   multivitamin with minerals Tabs tablet Take 1 tablet by mouth daily.   omeprazole 40 MG capsule Commonly known as: PRILOSEC Take 40 mg by mouth daily before breakfast.   polyethylene glycol 17 g packet Commonly known as: MIRALAX  / GLYCOLAX  Take 17 g by mouth 2 (two) times  daily.   rosuvastatin  40 MG tablet Commonly known as: CRESTOR  Take 40 mg by mouth daily.   sertraline  100 MG tablet Commonly known as: ZOLOFT  Take 200 mg by mouth in the morning.   Theratears 0.25 % Soln Generic drug: Carboxymethylcellulose Sodium Place 1 drop into both eyes 2 (two) times daily as needed (for redness or irritation).   TYLENOL  500 MG tablet Generic drug: acetaminophen  Take 500 mg by mouth See admin instructions. Take 500 mg by mouth at bedtime and an additional 500 mg once a day as needed for pain or headaches   Vitamin D3 50 MCG (2000 UT) Tabs Take 2,000 Units by mouth at bedtime.   Xarelto  20 MG Tabs tablet Generic drug: rivaroxaban  Take 1 tablet (20 mg total) by mouth daily with supper.        Allergies:  Allergies  Allergen Reactions   Benadryl  [Diphenhydramine ] Other (See Comments)    Affects  kidneys   Pepcid  [Famotidine ] Other (See Comments)    Affects kidneys   Beef (Diagnostic) Other (See Comments)    Vegetarian   Chicken Meat (Diagnostic) Other (See Comments)    Vegetarian    Food Other (See Comments)    Animal meat product - pt is vegetarian   Pork (Diagnostic) Other (See Comments)    Vegetarian     Past Medical History, Surgical history, Social history, and Family History were reviewed and updated.  Review of Systems: Review of Systems  Constitutional: Negative.   HENT: Negative.    Eyes: Negative.   Respiratory: Negative.    Cardiovascular: Negative.   Gastrointestinal: Negative.   Genitourinary: Negative.   Musculoskeletal: Negative.   Skin:  Positive for rash.  Neurological: Negative.   Endo/Heme/Allergies:  Bruises/bleeds easily.  Psychiatric/Behavioral: Negative.       Physical Exam:  height is 5' 10 (1.778 m). His oral temperature is 98.4 F (36.9 C). His blood pressure is 123/74 and his pulse is 77. His respiration is 18 and oxygen saturation is 97%.   Wt Readings from Last 3 Encounters:  08/06/23 191 lb 12.8 oz (87 kg)  07/28/23 199 lb 11.8 oz (90.6 kg)  07/22/23 199 lb 11.8 oz (90.6 kg)    Physical Exam Vitals reviewed.  HENT:     Head: Normocephalic and atraumatic.  Eyes:     Pupils: Pupils are equal, round, and reactive to light.  Cardiovascular:     Rate and Rhythm: Normal rate and regular rhythm.     Heart sounds: Normal heart sounds.  Pulmonary:     Effort: Pulmonary effort is normal.     Breath sounds: Normal breath sounds.  Abdominal:     General: Bowel sounds are normal.     Palpations: Abdomen is soft.  Musculoskeletal:        General: No tenderness or deformity. Normal range of motion.     Cervical back: Normal range of motion.  Lymphadenopathy:     Cervical: No cervical adenopathy.  Skin:    General: Skin is warm and dry.     Findings: No erythema or rash.     Comments: He does have some scattered ecchymoses on  his body.  Neurological:     Mental Status: He is alert and oriented to person, place, and time.  Psychiatric:        Behavior: Behavior normal.        Thought Content: Thought content normal.        Judgment: Judgment normal.  Lab Results  Component Value Date   WBC 11.7 (H) 11/14/2023   HGB 12.2 (L) 11/14/2023   HCT 40.0 11/14/2023   MCV 83.9 11/14/2023   PLT 189 11/14/2023   Lab Results  Component Value Date   FERRITIN 1,063 (H) 10/23/2016   IRON 15 (L) 06/30/2020   TIBC 196 (L) 06/30/2020   UIBC 181 06/30/2020   IRONPCTSAT 8 (L) 06/30/2020   Lab Results  Component Value Date   RETICCTPCT 1.5 08/20/2010   RBC 4.77 11/14/2023   RETICCTABS 75.3 08/20/2010   No results found for: KPAFRELGTCHN, LAMBDASER, KAPLAMBRATIO No results found for: IGGSERUM, IGA, IGMSERUM No results found for: STEPHANY CARLOTA BENSON MARKEL EARLA JOANNIE DOC VICK, SPEI   Chemistry      Component Value Date/Time   NA 141 11/14/2023 1451   NA 146 (H) 04/09/2017 1405   NA 138 05/09/2016 1321   K 4.1 11/14/2023 1451   K 4.0 04/09/2017 1405   K 4.3 05/09/2016 1321   CL 105 11/14/2023 1451   CL 108 04/09/2017 1405   CO2 21 (L) 11/14/2023 1451   CO2 23 04/09/2017 1405   CO2 22 05/09/2016 1321   BUN 27 (H) 11/14/2023 1451   BUN 34 (H) 04/09/2017 1405   BUN 36.4 (H) 05/09/2016 1321   CREATININE 1.37 (H) 11/14/2023 1451   CREATININE 1.3 (H) 04/09/2017 1405   CREATININE 1.5 (H) 05/09/2016 1321      Component Value Date/Time   CALCIUM  9.9 11/14/2023 1451   CALCIUM  9.1 04/09/2017 1405   CALCIUM  9.9 05/09/2016 1321   ALKPHOS 78 11/14/2023 1451   ALKPHOS 61 04/09/2017 1405   ALKPHOS 103 05/09/2016 1321   AST 43 (H) 11/14/2023 1451   AST 39 (H) 05/09/2016 1321   ALT 49 (H) 11/14/2023 1451   ALT 57 (H) 04/09/2017 1405   ALT 64 (H) 05/09/2016 1321   BILITOT 0.3 11/14/2023 1451   BILITOT 0.49 05/09/2016 1321       Impression and Plan: Mr.  [name] is a very pleasant 78 yo caucasian gentleman with ITP.  He has occasional relapses for which he gets high-dose Decadron  for 3 days which works well for him.   Again, I was happy that everything is going well for him.  Typically, when he gets these relapses of the ITP, we just get him on Decadron  and IVIG.  Sometimes, we have to get him on Rituxan .  For right now, I think we can probably just plan to get him back in another 6 weeks or so.   Maude JONELLE Crease, MD 8/1/20253:50 PM

## 2023-11-20 ENCOUNTER — Telehealth: Payer: Self-pay

## 2023-11-20 NOTE — Telephone Encounter (Signed)
 Patients wife called stating he is having a episode of ITP and they have started the medications, declined coming in for labwork at this time.

## 2024-01-02 ENCOUNTER — Inpatient Hospital Stay (HOSPITAL_BASED_OUTPATIENT_CLINIC_OR_DEPARTMENT_OTHER): Admitting: Hematology & Oncology

## 2024-01-02 ENCOUNTER — Inpatient Hospital Stay: Attending: Hematology & Oncology

## 2024-01-02 VITALS — BP 117/62 | HR 82 | Temp 99.2°F | Resp 19 | Ht 70.0 in | Wt 181.0 lb

## 2024-01-02 DIAGNOSIS — A419 Sepsis, unspecified organism: Secondary | ICD-10-CM | POA: Diagnosis not present

## 2024-01-02 DIAGNOSIS — D693 Immune thrombocytopenic purpura: Secondary | ICD-10-CM

## 2024-01-02 NOTE — Progress Notes (Signed)
 Hematology and Oncology Follow Up Visit  KAMRYN GAUTHIER [id number] Apr 15, 1946 78 y.o. 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 Positive lupus anticoagulant.   Current Therapy:        Xarelto  20 mg PO daily/ EC ASA 81 mg po q day Decadron  40 mg PO daily x 3 days as indicated for ITP   Interim History:  Mr. Boeding is here today with his wife.  He is doing okay.  Unfortunately, we cannot get any labs on him.  He was a bit late coming to the office.  Organ have to somehow get labs on him.  I would think that his platelet count should be okay.  He has had no problems with bruising or bleeding.  He has had no petechiae on his legs.  His wife is incredibly good at knowing when his platelet count is down.  His appetite is quite good.  He still is dealing with the complications of the stroke that he had.  He still has a left-sided weakness.  I think he is doing some physical therapy.  He has had no change in bowel or bladder habits.  He has not noted any leg swelling.  He continues on Xarelto  and baby aspirin .  Overall, I would say that his performance status is probably ECOG 1.    Medications:  Allergies as of 01/02/2024       Reactions   Benadryl  [diphenhydramine ] Other (See Comments)   Affects kidneys   Pepcid  [famotidine ] Other (See Comments)   Affects kidneys   Beef (diagnostic) Other (See Comments)   Vegetarian   Chicken Meat (diagnostic) Other (See Comments)   Vegetarian    Food Other (See Comments)   Animal meat product - pt is vegetarian   Pork (diagnostic) Other (See Comments)   Vegetarian         Medication List        Accurate as of January 02, 2024  3:40 PM. If you have any questions, ask your nurse or doctor.          STOP taking these medications    cefadroxil  500 MG capsule Commonly known as: DURICEF Stopped by: Janie Capp R Leyah Bocchino    fluticasone  50 MCG/ACT nasal spray Commonly known as: FLONASE  Stopped by: Maveric Debono R Jaanvi Fizer   gabapentin  100 MG capsule Commonly known as: NEURONTIN  Stopped by: Zymier Rodgers R Larkin Morelos   multivitamin with minerals Tabs tablet Stopped by: Nashea Chumney R Tuck Dulworth       TAKE these medications    alendronate 70 MG tablet Commonly known as: FOSAMAX Take 70 mg by mouth once a week.   amLODipine  10 MG tablet Commonly known as: NORVASC  Take 10 mg by mouth daily.   aspirin  EC 81 MG tablet Take 81 mg by mouth daily. Swallow whole.   baclofen  10 MG tablet Commonly known as: LIORESAL  Take 10 mg by mouth in the morning and at bedtime.   buPROPion  150 MG 24 hr tablet Commonly known as: WELLBUTRIN  XL Take 150 mg by mouth in the morning.   dexamethasone  4 MG tablet Commonly known as: DECADRON  TAKE 10 TABLETS BY MOUTH ONCE DAILY AS NEEDED FOR  3  DAYS  FOR  ITP   HYDROcodone -acetaminophen  10-325 MG tablet Commonly known as: NORCO Take 1 tablet by mouth 2 (two) times daily as needed for moderate pain (pain score 4-6).   levETIRAcetam  500 MG tablet Commonly known as: KEPPRA  Take 1,500 mg by  mouth in the morning and at bedtime.   melatonin 5 MG Tabs Take 1 tablet (5 mg total) by mouth at bedtime as needed.   omeprazole 40 MG capsule Commonly known as: PRILOSEC Take 40 mg by mouth daily before breakfast.   polyethylene glycol 17 g packet Commonly known as: MIRALAX  / GLYCOLAX  Take 17 g by mouth 2 (two) times daily.   rosuvastatin  40 MG tablet Commonly known as: CRESTOR  Take 40 mg by mouth daily.   sertraline  100 MG tablet Commonly known as: ZOLOFT  Take 200 mg by mouth in the morning.   Theratears 0.25 % Soln Generic drug: Carboxymethylcellulose Sodium Place 1 drop into both eyes 2 (two) times daily as needed (for redness or irritation).   TYLENOL  500 MG tablet Generic drug: acetaminophen  Take 500 mg by mouth See admin instructions. Take 500 mg by mouth at bedtime and an additional  500 mg once a day as needed for pain or headaches   Vitamin D3 50 MCG (2000 UT) Tabs Take 2,000 Units by mouth at bedtime.   Xarelto  20 MG Tabs tablet Generic drug: rivaroxaban  Take 1 tablet (20 mg total) by mouth daily with supper.        Allergies:  Allergies  Allergen Reactions   Benadryl  [Diphenhydramine ] Other (See Comments)    Affects kidneys   Pepcid  [Famotidine ] Other (See Comments)    Affects kidneys   Beef (Diagnostic) Other (See Comments)    Vegetarian   Chicken Meat (Diagnostic) Other (See Comments)    Vegetarian    Food Other (See Comments)    Animal meat product - pt is vegetarian   Pork (Diagnostic) Other (See Comments)    Vegetarian     Past Medical History, Surgical history, Social history, and Family History were reviewed and updated.  Review of Systems: Review of Systems  Constitutional: Negative.   HENT: Negative.    Eyes: Negative.   Respiratory: Negative.    Cardiovascular: Negative.   Gastrointestinal: Negative.   Genitourinary: Negative.   Musculoskeletal: Negative.   Skin:  Positive for rash.  Neurological: Negative.   Endo/Heme/Allergies:  Bruises/bleeds easily.  Psychiatric/Behavioral: Negative.       Physical Exam:  height is 5' 10 (1.778 m) and weight is 181 lb (82.1 kg). His oral temperature is 99.2 F (37.3 C). His blood pressure is 117/62 and his pulse is 82. His respiration is 19 and oxygen saturation is 95%.   Wt Readings from Last 3 Encounters:  01/02/24 181 lb (82.1 kg)  08/06/23 191 lb 12.8 oz (87 kg)  07/28/23 199 lb 11.8 oz (90.6 kg)    Physical Exam Vitals reviewed.  HENT:     Head: Normocephalic and atraumatic.  Eyes:     Pupils: Pupils are equal, round, and reactive to light.  Cardiovascular:     Rate and Rhythm: Normal rate and regular rhythm.     Heart sounds: Normal heart sounds.  Pulmonary:     Effort: Pulmonary effort is normal.     Breath sounds: Normal breath sounds.  Abdominal:     General:  Bowel sounds are normal.     Palpations: Abdomen is soft.  Musculoskeletal:        General: No tenderness or deformity. Normal range of motion.     Cervical back: Normal range of motion.  Lymphadenopathy:     Cervical: No cervical adenopathy.  Skin:    General: Skin is warm and dry.     Findings: No erythema or rash.  Comments: He does have some scattered ecchymoses on his body.  Neurological:     Mental Status: He is alert and oriented to person, place, and time.  Psychiatric:        Behavior: Behavior normal.        Thought Content: Thought content normal.        Judgment: Judgment normal.      Lab Results  Component Value Date   WBC 11.7 (H) 11/14/2023   HGB 12.2 (L) 11/14/2023   HCT 40.0 11/14/2023   MCV 83.9 11/14/2023   PLT 189 11/14/2023   Lab Results  Component Value Date   FERRITIN 1,063 (H) 10/23/2016   IRON 15 (L) 06/30/2020   TIBC 196 (L) 06/30/2020   UIBC 181 06/30/2020   IRONPCTSAT 8 (L) 06/30/2020   Lab Results  Component Value Date   RETICCTPCT 1.5 08/20/2010   RBC 4.77 11/14/2023   RETICCTABS 75.3 08/20/2010   No results found for: KPAFRELGTCHN, LAMBDASER, KAPLAMBRATIO No results found for: IGGSERUM, IGA, IGMSERUM No results found for: STEPHANY CARLOTA BENSON MARKEL EARLA JOANNIE DOC VICK, SPEI   Chemistry      Component Value Date/Time   NA 141 11/14/2023 1451   NA 146 (H) 04/09/2017 1405   NA 138 05/09/2016 1321   K 4.1 11/14/2023 1451   K 4.0 04/09/2017 1405   K 4.3 05/09/2016 1321   CL 105 11/14/2023 1451   CL 108 04/09/2017 1405   CO2 21 (L) 11/14/2023 1451   CO2 23 04/09/2017 1405   CO2 22 05/09/2016 1321   BUN 27 (H) 11/14/2023 1451   BUN 34 (H) 04/09/2017 1405   BUN 36.4 (H) 05/09/2016 1321   CREATININE 1.37 (H) 11/14/2023 1451   CREATININE 1.3 (H) 04/09/2017 1405   CREATININE 1.5 (H) 05/09/2016 1321      Component Value Date/Time   CALCIUM  9.9 11/14/2023 1451   CALCIUM  9.1  04/09/2017 1405   CALCIUM  9.9 05/09/2016 1321   ALKPHOS 78 11/14/2023 1451   ALKPHOS 61 04/09/2017 1405   ALKPHOS 103 05/09/2016 1321   AST 43 (H) 11/14/2023 1451   AST 39 (H) 05/09/2016 1321   ALT 49 (H) 11/14/2023 1451   ALT 57 (H) 04/09/2017 1405   ALT 64 (H) 05/09/2016 1321   BILITOT 0.3 11/14/2023 1451   BILITOT 0.49 05/09/2016 1321       Impression and Plan: Mr. Englert is a very pleasant 78 yo caucasian gentleman with ITP.  He has occasional relapses for which he gets high-dose Decadron  for 3 days which works well for him.   Again, we will have to try to get labs on him at some point.  I had believe that his platelet count should be okay.  We will see about getting labs in a week or so.  I will plan to see him back probably In November.  I want to try to get him in before the Holiday season and then try to get him through the [facility].   Maude JONELLE Crease, MD 9/19/20253:40 PM

## 2024-01-04 ENCOUNTER — Inpatient Hospital Stay (HOSPITAL_COMMUNITY)
Admission: EM | Admit: 2024-01-04 | Discharge: 2024-01-10 | DRG: 871 | Disposition: A | Discharge status: Home / Self Care

## 2024-01-04 ENCOUNTER — Emergency Department (HOSPITAL_COMMUNITY)

## 2024-01-04 ENCOUNTER — Other Ambulatory Visit: Payer: Self-pay

## 2024-01-04 ENCOUNTER — Encounter (HOSPITAL_COMMUNITY): Payer: Self-pay | Admitting: Emergency Medicine

## 2024-01-04 DIAGNOSIS — I129 Hypertensive chronic kidney disease with stage 1 through stage 4 chronic kidney disease, or unspecified chronic kidney disease: Secondary | ICD-10-CM | POA: Diagnosis present

## 2024-01-04 DIAGNOSIS — Z86718 Personal history of other venous thrombosis and embolism: Secondary | ICD-10-CM

## 2024-01-04 DIAGNOSIS — R55 Syncope and collapse: Secondary | ICD-10-CM | POA: Diagnosis present

## 2024-01-04 DIAGNOSIS — J189 Pneumonia, unspecified organism: Secondary | ICD-10-CM | POA: Diagnosis present

## 2024-01-04 DIAGNOSIS — R7989 Other specified abnormal findings of blood chemistry: Secondary | ICD-10-CM | POA: Insufficient documentation

## 2024-01-04 DIAGNOSIS — N281 Cyst of kidney, acquired: Secondary | ICD-10-CM | POA: Diagnosis not present

## 2024-01-04 DIAGNOSIS — I491 Atrial premature depolarization: Secondary | ICD-10-CM | POA: Diagnosis not present

## 2024-01-04 DIAGNOSIS — I700 Atherosclerosis of aorta: Secondary | ICD-10-CM | POA: Diagnosis present

## 2024-01-04 DIAGNOSIS — Z9081 Acquired absence of spleen: Secondary | ICD-10-CM

## 2024-01-04 DIAGNOSIS — R569 Unspecified convulsions: Secondary | ICD-10-CM | POA: Diagnosis not present

## 2024-01-04 DIAGNOSIS — D6862 Lupus anticoagulant syndrome: Secondary | ICD-10-CM | POA: Diagnosis present

## 2024-01-04 DIAGNOSIS — I2510 Atherosclerotic heart disease of native coronary artery without angina pectoris: Secondary | ICD-10-CM | POA: Diagnosis not present

## 2024-01-04 DIAGNOSIS — I7419 Embolism and thrombosis of other parts of aorta: Secondary | ICD-10-CM | POA: Diagnosis not present

## 2024-01-04 DIAGNOSIS — R42 Dizziness and giddiness: Secondary | ICD-10-CM | POA: Diagnosis not present

## 2024-01-04 DIAGNOSIS — I708 Atherosclerosis of other arteries: Secondary | ICD-10-CM | POA: Diagnosis present

## 2024-01-04 DIAGNOSIS — G40909 Epilepsy, unspecified, not intractable, without status epilepticus: Secondary | ICD-10-CM | POA: Diagnosis present

## 2024-01-04 DIAGNOSIS — J439 Emphysema, unspecified: Secondary | ICD-10-CM | POA: Diagnosis not present

## 2024-01-04 DIAGNOSIS — R6521 Severe sepsis with septic shock: Secondary | ICD-10-CM | POA: Diagnosis present

## 2024-01-04 DIAGNOSIS — A4151 Sepsis due to Escherichia coli [E. coli]: Secondary | ICD-10-CM | POA: Diagnosis not present

## 2024-01-04 DIAGNOSIS — D693 Immune thrombocytopenic purpura: Secondary | ICD-10-CM | POA: Diagnosis present

## 2024-01-04 DIAGNOSIS — L8990 Pressure ulcer of unspecified site, unspecified stage: Secondary | ICD-10-CM | POA: Insufficient documentation

## 2024-01-04 DIAGNOSIS — J9601 Acute respiratory failure with hypoxia: Secondary | ICD-10-CM | POA: Diagnosis present

## 2024-01-04 DIAGNOSIS — I7410 Embolism and thrombosis of unspecified parts of aorta: Secondary | ICD-10-CM | POA: Diagnosis not present

## 2024-01-04 DIAGNOSIS — Z66 Do not resuscitate: Secondary | ICD-10-CM | POA: Diagnosis not present

## 2024-01-04 DIAGNOSIS — Z743 Need for continuous supervision: Secondary | ICD-10-CM | POA: Diagnosis not present

## 2024-01-04 DIAGNOSIS — Z0389 Encounter for observation for other suspected diseases and conditions ruled out: Secondary | ICD-10-CM | POA: Diagnosis not present

## 2024-01-04 DIAGNOSIS — Z9049 Acquired absence of other specified parts of digestive tract: Secondary | ICD-10-CM

## 2024-01-04 DIAGNOSIS — I69354 Hemiplegia and hemiparesis following cerebral infarction affecting left non-dominant side: Secondary | ICD-10-CM | POA: Diagnosis not present

## 2024-01-04 DIAGNOSIS — E860 Dehydration: Secondary | ICD-10-CM | POA: Diagnosis not present

## 2024-01-04 DIAGNOSIS — Z888 Allergy status to other drugs, medicaments and biological substances status: Secondary | ICD-10-CM

## 2024-01-04 DIAGNOSIS — A419 Sepsis, unspecified organism: Secondary | ICD-10-CM | POA: Diagnosis not present

## 2024-01-04 DIAGNOSIS — G4730 Sleep apnea, unspecified: Secondary | ICD-10-CM | POA: Diagnosis present

## 2024-01-04 DIAGNOSIS — N3000 Acute cystitis without hematuria: Secondary | ICD-10-CM | POA: Diagnosis not present

## 2024-01-04 DIAGNOSIS — I252 Old myocardial infarction: Secondary | ICD-10-CM

## 2024-01-04 DIAGNOSIS — R404 Transient alteration of awareness: Secondary | ICD-10-CM | POA: Diagnosis not present

## 2024-01-04 DIAGNOSIS — N319 Neuromuscular dysfunction of bladder, unspecified: Secondary | ICD-10-CM | POA: Diagnosis not present

## 2024-01-04 DIAGNOSIS — I82421 Acute embolism and thrombosis of right iliac vein: Secondary | ICD-10-CM | POA: Diagnosis not present

## 2024-01-04 DIAGNOSIS — D696 Thrombocytopenia, unspecified: Secondary | ICD-10-CM | POA: Diagnosis present

## 2024-01-04 DIAGNOSIS — Z7901 Long term (current) use of anticoagulants: Secondary | ICD-10-CM

## 2024-01-04 DIAGNOSIS — Z91014 Allergy to mammalian meats: Secondary | ICD-10-CM

## 2024-01-04 DIAGNOSIS — D509 Iron deficiency anemia, unspecified: Secondary | ICD-10-CM | POA: Diagnosis present

## 2024-01-04 DIAGNOSIS — K224 Dyskinesia of esophagus: Secondary | ICD-10-CM | POA: Diagnosis present

## 2024-01-04 DIAGNOSIS — G4700 Insomnia, unspecified: Secondary | ICD-10-CM | POA: Diagnosis not present

## 2024-01-04 DIAGNOSIS — Z87440 Personal history of urinary (tract) infections: Secondary | ICD-10-CM

## 2024-01-04 DIAGNOSIS — Z993 Dependence on wheelchair: Secondary | ICD-10-CM

## 2024-01-04 DIAGNOSIS — Z91018 Allergy to other foods: Secondary | ICD-10-CM

## 2024-01-04 DIAGNOSIS — R918 Other nonspecific abnormal finding of lung field: Secondary | ICD-10-CM | POA: Diagnosis not present

## 2024-01-04 DIAGNOSIS — Z79899 Other long term (current) drug therapy: Secondary | ICD-10-CM

## 2024-01-04 DIAGNOSIS — J690 Pneumonitis due to inhalation of food and vomit: Secondary | ICD-10-CM | POA: Diagnosis not present

## 2024-01-04 DIAGNOSIS — E785 Hyperlipidemia, unspecified: Secondary | ICD-10-CM | POA: Diagnosis not present

## 2024-01-04 DIAGNOSIS — H9193 Unspecified hearing loss, bilateral: Secondary | ICD-10-CM | POA: Diagnosis present

## 2024-01-04 DIAGNOSIS — N1831 Chronic kidney disease, stage 3a: Secondary | ICD-10-CM | POA: Diagnosis present

## 2024-01-04 DIAGNOSIS — K5900 Constipation, unspecified: Secondary | ICD-10-CM | POA: Diagnosis present

## 2024-01-04 DIAGNOSIS — Z883 Allergy status to other anti-infective agents status: Secondary | ICD-10-CM

## 2024-01-04 DIAGNOSIS — Z7983 Long term (current) use of bisphosphonates: Secondary | ICD-10-CM

## 2024-01-04 DIAGNOSIS — Z974 Presence of external hearing-aid: Secondary | ICD-10-CM

## 2024-01-04 DIAGNOSIS — L89111 Pressure ulcer of right upper back, stage 1: Secondary | ICD-10-CM | POA: Diagnosis not present

## 2024-01-04 DIAGNOSIS — Z7982 Long term (current) use of aspirin: Secondary | ICD-10-CM

## 2024-01-04 DIAGNOSIS — Z87891 Personal history of nicotine dependence: Secondary | ICD-10-CM

## 2024-01-04 LAB — COMPREHENSIVE METABOLIC PANEL WITH GFR
ALT: 28 U/L (ref 0–44)
AST: 29 U/L (ref 15–41)
Albumin: 3.5 g/dL (ref 3.5–5.0)
Alkaline Phosphatase: 79 U/L (ref 38–126)
Anion gap: 12 (ref 5–15)
BUN: 27 mg/dL — ABNORMAL HIGH (ref 8–23)
CO2: 19 mmol/L — ABNORMAL LOW (ref 22–32)
Calcium: 8.6 mg/dL — ABNORMAL LOW (ref 8.9–10.3)
Chloride: 110 mmol/L (ref 98–111)
Creatinine, Ser: 1.62 mg/dL — ABNORMAL HIGH (ref 0.61–1.24)
GFR, Estimated: 43 mL/min — ABNORMAL LOW (ref >60)
Glucose, Bld: 139 mg/dL — ABNORMAL HIGH (ref 70–99)
Potassium: 4.6 mmol/L (ref 3.5–5.1)
Sodium: 141 mmol/L (ref 135–145)
Total Bilirubin: 0.7 mg/dL (ref 0.0–1.2)
Total Protein: 6.8 g/dL (ref 6.5–8.1)

## 2024-01-04 LAB — CBC WITH DIFFERENTIAL/PLATELET
Abs Immature Granulocytes: 0.1 K/uL — ABNORMAL HIGH (ref 0.00–0.07)
Basophils Absolute: 0.1 K/uL (ref 0.0–0.1)
Basophils Relative: 0 %
Eosinophils Absolute: 0.8 K/uL — ABNORMAL HIGH (ref 0.0–0.5)
Eosinophils Relative: 5 %
HCT: 39 % (ref 39.0–52.0)
Hemoglobin: 12 g/dL — ABNORMAL LOW (ref 13.0–17.0)
Immature Granulocytes: 1 %
Lymphocytes Relative: 14 %
Lymphs Abs: 2.1 K/uL (ref 0.7–4.0)
MCH: 26.1 pg (ref 26.0–34.0)
MCHC: 30.8 g/dL (ref 30.0–36.0)
MCV: 85 fL (ref 80.0–100.0)
Monocytes Absolute: 0.6 K/uL (ref 0.1–1.0)
Monocytes Relative: 4 %
Neutro Abs: 11.6 K/uL — ABNORMAL HIGH (ref 1.7–7.7)
Neutrophils Relative %: 76 %
Platelets: 42 K/uL — ABNORMAL LOW (ref 150–400)
RBC: 4.59 MIL/uL (ref 4.22–5.81)
RDW: 19.5 % — ABNORMAL HIGH (ref 11.5–15.5)
WBC: 15.2 K/uL — ABNORMAL HIGH (ref 4.0–10.5)
nRBC: 0 % (ref 0.0–0.2)

## 2024-01-04 LAB — LIPASE, BLOOD: Lipase: 22 U/L (ref 11–51)

## 2024-01-04 LAB — URINALYSIS, W/ REFLEX TO CULTURE (INFECTION SUSPECTED)
Bacteria, UA: NONE SEEN
Bilirubin Urine: NEGATIVE
Glucose, UA: NEGATIVE mg/dL
Hgb urine dipstick: NEGATIVE
Ketones, ur: NEGATIVE mg/dL
Nitrite: NEGATIVE
Protein, ur: 100 mg/dL — AB
Specific Gravity, Urine: 1.017 (ref 1.005–1.030)
WBC, UA: >50 WBC/hpf (ref 0–5)
pH: 5 (ref 5.0–8.0)

## 2024-01-04 LAB — D-DIMER, QUANTITATIVE: D-Dimer, Quant: 0.77 ug{FEU}/mL — ABNORMAL HIGH (ref 0.00–0.50)

## 2024-01-04 LAB — TROPONIN I (HIGH SENSITIVITY)
Troponin I (High Sensitivity): 10 ng/L (ref <18)
Troponin I (High Sensitivity): 9 ng/L (ref <18)

## 2024-01-04 LAB — I-STAT CHEM 8, ED
BUN: 37 mg/dL — ABNORMAL HIGH (ref 8–23)
Calcium, Ion: 1.12 mmol/L — ABNORMAL LOW (ref 1.15–1.40)
Chloride: 110 mmol/L (ref 98–111)
Creatinine, Ser: 1.7 mg/dL — ABNORMAL HIGH (ref 0.61–1.24)
Glucose, Bld: 144 mg/dL — ABNORMAL HIGH (ref 70–99)
HCT: 36 % — ABNORMAL LOW (ref 39.0–52.0)
Hemoglobin: 12.2 g/dL — ABNORMAL LOW (ref 13.0–17.0)
Potassium: 5.1 mmol/L (ref 3.5–5.1)
Sodium: 142 mmol/L (ref 135–145)
TCO2: 24 mmol/L (ref 22–32)

## 2024-01-04 LAB — I-STAT CG4 LACTIC ACID, ED
Lactic Acid, Venous: 2.1 mmol/L (ref 0.5–1.9)
Lactic Acid, Venous: 1.6 mmol/L (ref 0.5–1.9)

## 2024-01-04 LAB — PROTIME-INR
INR: 2.3 — ABNORMAL HIGH (ref 0.8–1.2)
Prothrombin Time: 26.5 s — ABNORMAL HIGH (ref 11.4–15.2)

## 2024-01-04 LAB — RESP PANEL BY RT-PCR (RSV, FLU A&B, COVID)  RVPGX2
Influenza A by PCR: NEGATIVE
Influenza B by PCR: NEGATIVE
Resp Syncytial Virus by PCR: NEGATIVE
SARS Coronavirus 2 by RT PCR: NEGATIVE

## 2024-01-04 LAB — LACTATE DEHYDROGENASE: LDH: 226 U/L — ABNORMAL HIGH (ref 98–192)

## 2024-01-04 LAB — APTT: aPTT: 63 s — ABNORMAL HIGH (ref 24–36)

## 2024-01-04 MED ORDER — PROCHLORPERAZINE EDISYLATE 10 MG/2ML IJ SOLN
5.0000 mg | Freq: Four times a day (QID) | INTRAMUSCULAR | Status: DC | PRN
  Administered 2024-01-07: 5 mg via INTRAVENOUS
  Filled 2024-01-04: qty 2

## 2024-01-04 MED ORDER — SODIUM CHLORIDE 0.9 % IV BOLUS (SEPSIS)
1000.0000 mL | Freq: Once | INTRAVENOUS | Status: AC
  Administered 2024-01-04: 1000 mL via INTRAVENOUS

## 2024-01-04 MED ORDER — VANCOMYCIN HCL 1750 MG/350ML IV SOLN
1750.0000 mg | Freq: Once | INTRAVENOUS | Status: AC
  Administered 2024-01-04: 1750 mg via INTRAVENOUS
  Filled 2024-01-04: qty 350

## 2024-01-04 MED ORDER — VANCOMYCIN HCL IN DEXTROSE 1-5 GM/200ML-% IV SOLN: 1000.0000 mg | Freq: Once | INTRAVENOUS | Status: DC

## 2024-01-04 MED ORDER — ACETAMINOPHEN 325 MG PO TABS
650.0000 mg | ORAL_TABLET | Freq: Four times a day (QID) | ORAL | Status: DC | PRN
  Administered 2024-01-06: 650 mg via ORAL
  Filled 2024-01-04 (×2): qty 2

## 2024-01-04 MED ORDER — POLYETHYLENE GLYCOL 3350 17 G PO PACK: 17.0000 g | PACK | Freq: Every day | ORAL | Status: DC | PRN

## 2024-01-04 MED ORDER — IOHEXOL 350 MG/ML SOLN
75.0000 mL | Freq: Once | INTRAVENOUS | Status: AC | PRN
Start: 2024-01-04 — End: 2024-01-04
  Administered 2024-01-04: 75 mL via INTRAVENOUS

## 2024-01-04 MED ORDER — SODIUM CHLORIDE 0.9 % IV SOLN
2.0000 g | Freq: Once | INTRAVENOUS | Status: AC
  Administered 2024-01-04: 2 g via INTRAVENOUS
  Filled 2024-01-04: qty 12.5

## 2024-01-04 MED ORDER — MELATONIN 5 MG PO TABS
5.0000 mg | ORAL_TABLET | Freq: Every evening | ORAL | Status: DC | PRN
  Administered 2024-01-06 – 2024-01-07 (×2): 5 mg via ORAL
  Filled 2024-01-04 (×2): qty 1

## 2024-01-04 MED ORDER — METRONIDAZOLE 500 MG/100ML IV SOLN
500.0000 mg | Freq: Once | INTRAVENOUS | Status: AC
  Administered 2024-01-04: 500 mg via INTRAVENOUS
  Filled 2024-01-04: qty 100

## 2024-01-04 MED ORDER — LACTATED RINGERS IV SOLN: INTRAVENOUS | Status: AC

## 2024-01-04 NOTE — H&P (Incomplete)
 History and Physical  PIO EATHERLY FMW:[id number] DOB: 1945/10/10 DOA: 01/04/2024  Referring physician: Edie Einstein, PA-EDP  PCP: Teresa Channel, MD  Outpatient Specialists: Oncology. Patient coming from: Home.  Chief Complaint: Passed out   HPI: [name] is a 78 y.o. male with medical history significant for CVA with left-sided weakness, ITP, lupus anticoagulant neurogenic bladder, who presents with an episode of syncope.  The patient was sitting down eating when she suddenly suddenly slumped over vomited.  EMS was activated.  Upon EMS arrival the patient was SBP was in the 80s.    In the ER, tachypneic     ED Course: ***  Review of Systems: Review of systems as noted in the HPI. All other systems reviewed and are negative.   Past Medical History:  Diagnosis Date  . Chronic ITP (idiopathic thrombocytopenia) (HCC)   . Chronic ITP (idiopathic thrombocytopenia) (HCC)   . Coronary artery disease 1992   MI  . Difficulty swallowing    pt takes all meds wiht applesauce  . DVT (deep venous thrombosis) (HCC)   . Hard of hearing    hearing aids   . 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 Hypertension   . 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   . Lupus anticoagulant disorder (HCC)   . Lupus anticoagulant syndrome (HCC)   . Multiple closed anterior-posterior compression fractures of pelvis (HCC)   . Seizures (HCC)    last seizure 10 years ago approx   . Sleep apnea    cpap broken x 1 year per wife   . Stroke [facility]) 02/27/2011   Left side weakness   Past Surgical History:  Procedure Laterality Date  . blood clot Right    surgical removal  . CHOLECYSTECTOMY    . CORONARY ANGIOPLASTY  1992  . CYSTOGRAM N/A 02/17/2020   Procedure: CYSTOGRAM;  Surgeon: Cam Morene ORN, MD;  Location: [facility] ORS;  Service: Urology;  Laterality: N/A;  . CYSTOSCOPY WITH RETROGRADE PYELOGRAM, URETEROSCOPY AND STENT PLACEMENT Bilateral 02/17/2020   Procedure: REATHER SHIRLY CHARS   URETEROSCOPY AND STENT PLACEMENT;  Surgeon: Cam Morene ORN, MD;  Location: [facility] ORS;  Service: Urology;  Laterality: Bilateral;  . SPLENECTOMY, TOTAL    . vertebralplasty      Social History:  reports that he quit smoking about 38 years ago. His smoking use included cigarettes. He started smoking about 55 years ago. He has a 16.6 pack-year smoking history. He has never used smokeless tobacco. He reports that he does not currently use drugs. He reports that he does not drink alcohol .   Allergies  Allergen Reactions  . Benadryl  [Diphenhydramine ] Other (See Comments)    Affects kidneys  . Pepcid  [Famotidine ] Other (See Comments)    Affects kidneys  . Beef (Diagnostic) Other (See Comments)    Vegetarian  . Chicken Meat (Diagnostic) Other (See Comments)    Vegetarian   . Food Other (See Comments)    Animal meat product - pt is vegetarian  . Pork (Diagnostic) Other (See Comments)    Vegetarian     History reviewed. No pertinent family history.  ***  Prior to Admission medications   Medication Sig Start Date End Date Taking? Authorizing Provider  alendronate (FOSAMAX) 70 MG tablet Take 70 mg by mouth once a week. 10/24/23   [provider]  amLODipine  (NORVASC ) 10 MG tablet Take 10 mg by mouth daily. 12/09/23   [provider]  aspirin  EC 81 MG tablet Take 81 mg by  mouth daily. Swallow whole.    [provider]  baclofen  (LIORESAL ) 10 MG tablet Take 10 mg by mouth in the morning and at bedtime.    [provider]  buPROPion  (WELLBUTRIN  XL) 150 MG 24 hr tablet Take 150 mg by mouth in the morning. 12/19/14   [provider]  Cholecalciferol  (VITAMIN D3) 50 MCG (2000 UT) TABS Take 2,000 Units by mouth at bedtime.    [provider]  dexamethasone  (DECADRON ) 4 MG tablet TAKE 10 TABLETS BY MOUTH ONCE DAILY AS NEEDED FOR  3  DAYS  FOR  ITP 10/13/23   [name], MD  HYDROcodone -acetaminophen  (NORCO) 10-325 MG tablet Take 1 tablet by  mouth 2 (two) times daily as needed for moderate pain (pain score 4-6). 11/20/23   [provider]  levETIRAcetam  (KEPPRA ) 500 MG tablet Take 1,500 mg by mouth in the morning and at bedtime. 12/24/11   [provider]  melatonin 5 MG TABS Take 1 tablet (5 mg total) by mouth at bedtime as needed. 07/21/23   Love, [name], PA-C  omeprazole (PRILOSEC) 40 MG capsule Take 40 mg by mouth daily before breakfast. 08/01/19   [provider]  polyethylene glycol (MIRALAX  / GLYCOLAX ) 17 g packet Take 17 g by mouth 2 (two) times daily. 08/12/23   Love, [name], PA-C  rivaroxaban  (XARELTO ) 20 MG TABS tablet Take 1 tablet (20 mg total) by mouth daily with supper. 07/21/23   Love, [name], PA-C  rosuvastatin  (CRESTOR ) 40 MG tablet Take 40 mg by mouth daily.    [provider]  sertraline  (ZOLOFT ) 100 MG tablet Take 200 mg by mouth in the morning.     [provider]  THERATEARS 0.25 % SOLN Place 1 drop into both eyes 2 (two) times daily as needed (for redness or irritation).    [provider]  TYLENOL  500 MG tablet Take 500 mg by mouth See admin instructions. Take 500 mg by mouth at bedtime and an additional 500 mg once a day as needed for pain or headaches    [provider]    Physical Exam: BP 112/65   Pulse 88   Temp 97.7 F (36.5 C) (Oral)   Resp 15   SpO2 99%   General: 78 y.o. year-old male well developed well nourished in no acute distress.  Alert and oriented x3. Cardiovascular: Regular rate and rhythm with no rubs or gallops.  No thyromegaly or JVD noted.  No lower extremity edema. 2/4 pulses in all 4 extremities. Respiratory: Clear to auscultation with no wheezes or rales. Good inspiratory effort. Abdomen: Soft nontender nondistended with normal bowel sounds x4 quadrants. Muskuloskeletal: No cyanosis, clubbing or edema noted bilaterally Neuro: CN II-XII intact, strength, sensation, reflexes Skin: No ulcerative lesions noted or  rashes Psychiatry: Judgement and insight appear normal. Mood is appropriate for condition and setting          Labs on Admission:  Basic Metabolic Panel: Recent Labs  Lab 01/04/24 1729 01/04/24 1750  NA 141 142  K 4.6 5.1  CL 110 110  CO2 19*  --   GLUCOSE 139* 144*  BUN 27* 37*  CREATININE 1.62* 1.70*  CALCIUM  8.6*  --    Liver Function Tests: Recent Labs  Lab 01/04/24 1729  AST 29  ALT 28  ALKPHOS 79  BILITOT 0.7  PROT 6.8  ALBUMIN 3.5   Recent Labs  Lab 01/04/24 1729  LIPASE 22   No results for input(s):  AMMONIA in the last 168 hours. CBC: Recent Labs  Lab 01/04/24 1729 01/04/24 1750  WBC 15.2*  --   NEUTROABS 11.6*  --   HGB 12.0* 12.2*  HCT 39.0 36.0*  MCV 85.0  --   PLT 42*  --    Cardiac Enzymes: No results for input(s): CKTOTAL, CKMB, CKMBINDEX, TROPONINI in the last 168 hours.  BNP (last 3 results) No results for input(s): BNP in the last 8760 hours.  ProBNP (last 3 results) No results for input(s): PROBNP in the last 8760 hours.  CBG: No results for input(s): GLUCAP in the last 168 hours.  Radiological Exams on Admission: CT Angio Chest PE W and/or Wo Contrast Result Date: 01/04/2024 CLINICAL DATA:  Questionable sepsis, syncopal episode, previous history of pulmonary embolism and with hypotension. EXAM: CT ANGIOGRAPHY CHEST CT ABDOMEN AND PELVIS WITH CONTRAST TECHNIQUE: Multidetector CT imaging of the chest was performed using the standard protocol during bolus administration of intravenous contrast. Multiplanar CT image reconstructions and MIPs were obtained to evaluate the vascular anatomy. Multidetector CT imaging of the abdomen and pelvis was performed using the standard protocol during bolus administration of intravenous contrast. RADIATION DOSE REDUCTION: This exam was performed according to the departmental dose-optimization program which includes automated exposure control, adjustment of the mA and/or kV according to  patient size and/or use of iterative reconstruction technique. CONTRAST:  75mL OMNIPAQUE  IOHEXOL  350 MG/ML SOLN COMPARISON:  Portable chest from today, portable chest 07/28/2023, and chest CTs without contrast 10/29/2019 and 05/09/2017. Abdomen pelvis CT compared with CT abdomen and pelvis without and with contrast 02/03/2020 and 08/27/2007. FINDINGS: CTA CHEST FINDINGS Cardiovascular: There is diagnostic pulmonary arterial opacification. No arterial dilatation or embolus is seen. The pulmonary veins are normal. The cardiac size is normal. There is no pericardial effusion. There are left main scattered single-vessel calcific plaques of the LAD coronary artery. There is mild aortic tortuosity with scattered calcific plaques in the aorta and great vessels. No aneurysm, stenosis or dissection. Mediastinum/Nodes: Small hiatal hernia. There is a patulous esophagus, interval new mild to moderate distal esophageal wall thickening warranting consideration of endoscopic follow-up. The trachea and main bronchi are clear. There are slightly prominent right hilar lymph nodes up to 1 cm short axis, borderline prominent subcarinal and precarinal lymph nodes no further adenopathy. Axillary regions are clear. Thyroid  gland unremarkable. Lungs/Pleura: There is patchy hazy airspace disease in the anterior segment of the left upper lobe, small infiltrate in the infrahilar lingula with subsegmental bronchial impaction, and denser patchy consolidation in the posterior basal left lower lobe. Findings consistent with multilobar pneumonia. A follow-up study is recommended after treatment to ensure clearing. There is diffuse bronchial thickening, interval increased widespread mosaicism consistent with air trapping with small airways disease. There are mild paraseptal emphysematous changes in both upper lobes, apical reticulated scar-like opacities are again noted. 9 mm stable solid right middle on 7:70 is presumed benign due to length of  stability. There are scattered linear scar-like opacities both bases. No pleural effusions. Musculoskeletal: Osteopenia. There is chronic wedging and kyphoplasty cement in the T11 and 12 vertebral bodies. Degenerative changes lumbar spine. No acute or other significant osseous findings. Healed fractures are noted of some of the lateral left ribs. Unremarkable visualized chest wall. Review of the MIP images confirms the above findings. CT ABDOMEN and PELVIS FINDINGS Hepatobiliary: Respiratory motion limits fine detail. No focal abnormality is seen through the motion artifact. The gallbladder again noted absent without biliary ductal dilatation. Pancreas: Partially atrophic. No  focal abnormality is seen through the motion artifact. Spleen: Surgically absent. Adrenals/Urinary Tract: Cortical thinning in renal volume loss right-greater-than-left, are again noted, some progression noted since 2021. No mass enhancement of the kidneys is seen through the motion artifact. There is symmetric contrast uptake and excretion. There are Bosniak 2 subcentimeter cortical cysts which are too small to characterize. Some of these are new since 2021. No follow-up imaging is required. There is no urinary stone or obstruction. The bladder is diffusely thickened but also not fully distended. Correlate clinically for cystitis versus muscular hypertrophy. Stomach/Bowel: Fluid filling in the stomach. No overt wall thickening. No small bowel dilatation. Normal appendix. There is mild-to-moderate retained stool in the transverse colon. There is a nonobstructed short loop of the mid descending colon extending into a lateral abdominal wall hernia sac. No hernia incarceration is suspected. There is no wall thickening or dilatation of the colon, but there is a large amount of stool in the rectosigmoid segment. There are no findings of acute stercoral proctitis but disimpaction is likely indicated. Vascular/Lymphatic: Moderate aortoiliac  atherosclerosis. 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 No adenopathy is seen. Reproductive: Mild prostatomegaly. Probable phleboliths. Both testicles are in the scrotal sac. There are postsurgical changes of prior vasectomy. Other: No free fluid, free hemorrhage or free air. Musculoskeletal: Osteopenia. There are mild chronic central endplate compression fractures at L2 and 4. 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 No new osseous abnormality.  No acute skeletal findings. Review of the MIP images confirms the above findings. IMPRESSION: 1. No evidence of pulmonary arterial dilatation or embolus. 2. Aortic and coronary artery atherosclerosis. 3. Multilobar pneumonia left upper and lower lobes. Follow-up study recommended after treatment to ensure clearing. 4. Diffuse bronchial thickening with increased mosaicism consistent with air trapping and small airways disease. 5. Mildly prominent right hilar lymph nodes, probably reactive. 6. Patulous esophagus with interval new mild to moderate distal esophageal wall thickening. Consider endoscopic follow-up. 7. Constipation with large amount of stool in the rectosigmoid segment. No findings of stercoral proctitis but disimpaction is likely indicated. 8. Left lateral abdominal wall hernia containing a short loop of the mid descending colon without evidence of incarceration or obstruction. 9. Cystitis or bladder hypertrophy versus bladder nondistention. 10. Osteopenia and degenerative change. Chronic compression fractures of T11 and T12 with kyphoplasty cement. 11. 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 12. Emphysema. Aortic Atherosclerosis (ICD10-I70.0) and Emphysema (ICD10-J43.9). Electronically Signed   By: Francis Quam M.D.   On: 01/04/2024 22:22   CT ABDOMEN PELVIS W CONTRAST Result Date: 01/04/2024 CLINICAL DATA:  Questionable sepsis, syncopal episode, previous history of pulmonary embolism and with hypotension. EXAM: CT ANGIOGRAPHY CHEST CT ABDOMEN AND PELVIS WITH CONTRAST TECHNIQUE: Multidetector CT imaging of the chest was performed using the standard protocol during bolus administration of intravenous contrast. Multiplanar CT image reconstructions and MIPs were obtained to evaluate the vascular anatomy. Multidetector CT imaging of the abdomen and pelvis was performed using the standard protocol during bolus administration of intravenous contrast. RADIATION DOSE REDUCTION: This exam was performed according to the  departmental dose-optimization program which includes automated exposure control, adjustment of the mA and/or kV according to patient size and/or use of iterative reconstruction technique. CONTRAST:  75mL OMNIPAQUE  IOHEXOL  350 MG/ML SOLN COMPARISON:  Portable chest from today, portable chest 07/28/2023, and chest CTs without contrast 10/29/2019 and 05/09/2017. Abdomen pelvis CT compared with CT abdomen and pelvis without and with contrast 02/03/2020 and 08/27/2007. FINDINGS: CTA CHEST FINDINGS Cardiovascular: There is diagnostic pulmonary arterial opacification. No arterial dilatation or embolus is seen. The pulmonary veins are normal. The cardiac size is normal. There is no pericardial effusion. There are left main scattered single-vessel calcific plaques of the LAD coronary artery. There is mild aortic tortuosity with scattered calcific plaques in the aorta and great vessels. No aneurysm, stenosis or dissection. Mediastinum/Nodes: Small hiatal hernia. There is a  patulous esophagus, interval new mild to moderate distal esophageal wall thickening warranting consideration of endoscopic follow-up. The trachea and main bronchi are clear. There are slightly prominent right hilar lymph nodes up to 1 cm short axis, borderline prominent subcarinal and precarinal lymph nodes no further adenopathy. Axillary regions are clear. Thyroid  gland unremarkable. Lungs/Pleura: There is patchy hazy airspace disease in the anterior segment of the left upper lobe, small infiltrate in the infrahilar lingula with subsegmental bronchial impaction, and denser patchy consolidation in the posterior basal left lower lobe. Findings consistent with multilobar pneumonia. A follow-up study is recommended after treatment to ensure clearing. There is diffuse bronchial thickening, interval increased widespread mosaicism consistent with air trapping with small airways disease. There are mild paraseptal emphysematous changes in both upper lobes, apical reticulated scar-like opacities are again noted. 9 mm stable solid right middle on 7:70 is presumed benign due to length of stability. There are scattered linear scar-like opacities both bases. No pleural effusions. Musculoskeletal: Osteopenia. There is chronic wedging and kyphoplasty cement in the T11 and 12 vertebral bodies. Degenerative changes lumbar spine. No acute or other significant osseous findings. Healed fractures are noted of some of the lateral left ribs. Unremarkable visualized chest wall. Review of the MIP images confirms the above findings. CT ABDOMEN and PELVIS FINDINGS Hepatobiliary: Respiratory motion limits fine detail. No focal abnormality is seen through the motion artifact. The gallbladder again noted absent without biliary ductal dilatation. Pancreas: Partially atrophic. No focal abnormality is seen through the motion artifact. Spleen: Surgically absent. Adrenals/Urinary Tract: Cortical thinning in renal volume loss right-greater-than-left,  are again noted, some progression noted since 2021. No mass enhancement of the kidneys is seen through the motion artifact. There is symmetric contrast uptake and excretion. There are Bosniak 2 subcentimeter cortical cysts which are too small to characterize. Some of these are new since 2021. No follow-up imaging is required. There is no urinary stone or obstruction. The bladder is diffusely thickened but also not fully distended. Correlate clinically for cystitis versus muscular hypertrophy. Stomach/Bowel: Fluid filling in the stomach. No overt wall thickening. No small bowel dilatation. Normal appendix. There is mild-to-moderate retained stool in the transverse colon. There is a nonobstructed short loop of the mid descending colon extending into a lateral abdominal wall hernia sac. No hernia incarceration is suspected. There is no wall thickening or dilatation of the colon, but there is a large amount of stool in the rectosigmoid segment. There are no findings of acute stercoral proctitis but disimpaction is likely indicated. Vascular/Lymphatic: Moderate aortoiliac atherosclerosis. 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 No adenopathy is seen. Reproductive: Mild prostatomegaly. Probable phleboliths. Both testicles are in the scrotal sac. There are postsurgical changes of prior vasectomy. Other: No free fluid, free hemorrhage or free air. Musculoskeletal: Osteopenia. There are mild chronic central endplate compression fractures at L2 and 4. 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 No new osseous abnormality.  No acute skeletal findings. Review of the MIP images confirms the above findings. IMPRESSION:  1. No evidence of pulmonary arterial dilatation or embolus. 2. Aortic and coronary artery atherosclerosis. 3. Multilobar pneumonia left upper and lower lobes. Follow-up study recommended after treatment to ensure clearing. 4. Diffuse bronchial thickening with increased mosaicism consistent with air trapping and small airways disease. 5. Mildly prominent right hilar lymph nodes, probably reactive. 6. Patulous esophagus with interval new mild to moderate distal esophageal wall thickening. Consider endoscopic follow-up. 7. Constipation with large amount of stool in the rectosigmoid segment. No findings of stercoral proctitis but disimpaction is likely indicated. 8. Left lateral abdominal wall hernia containing a short loop of the mid descending colon without evidence of incarceration or obstruction. 9. Cystitis or bladder hypertrophy versus bladder nondistention. 10. Osteopenia and degenerative change. Chronic compression fractures of T11 and T12 with kyphoplasty cement. 11. 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 12. Emphysema. Aortic Atherosclerosis (ICD10-I70.0) and Emphysema (ICD10-J43.9). Electronically Signed   By: Francis Quam M.D.   On: 01/04/2024 22:22   CT Head Wo Contrast Result Date: 01/04/2024 EXAM: CT HEAD WITHOUT CONTRAST 01/04/2024 08:58:03 PM TECHNIQUE: CT of the head was performed without the administration of intravenous contrast. Automated exposure control, iterative reconstruction, and/or weight based adjustment of the mA/kV was utilized to reduce the radiation dose to as low as reasonably achievable. COMPARISON: 01/07/2020 CLINICAL HISTORY: Syncope/presyncope, cerebrovascular cause suspected. FINDINGS: BRAIN AND VENTRICLES: Old right MCA distribution infarct with encephalomalacic changes and ex vacuo dilatation of  the right lateral ventricle. No acute hemorrhage. No evidence of acute infarct. No hydrocephalus. No extra-axial collection. No mass effect or midline shift. Mild age-related atrophy. Subcortical and periventricular small vessel ischemic changes. ORBITS: No acute abnormality. SINUSES: No acute abnormality. SOFT TISSUES AND SKULL: No acute soft tissue abnormality. No skull fracture. Mild intracranial atherosclerosis. IMPRESSION: 1. No acute intracranial abnormality. 2. Old right MCA territory infarct. Small vessel ischemic changes. Electronically signed by: Pinkie Pebbles MD 01/04/2024 09:04 PM EDT RP Workstation: [id number]   DG Chest Port 1 View Result Date: 01/04/2024 CLINICAL DATA:  Questionable sepsis - evaluate for abnormality EXAM: PORTABLE CHEST 1 VIEW COMPARISON:  07/28/2023 FINDINGS: Heart and mediastinal contours are within normal limits. No focal opacities or effusions. No acute bony abnormality. No pneumothorax. IMPRESSION: No active disease. Electronically Signed   By: Franky Crease M.D.   On: 01/04/2024 18:14    EKG: I independently viewed the EKG done and my findings are as followed: ***   Assessment/Plan Present on Admission: . Sepsis (HCC)  Principal Problem:   Sepsis (HCC)   DVT prophylaxis: ***   Code Status: ***   Family Communication: ***   Disposition Plan: ***   Consults called: ***   Admission status: ***    Status is: Inpatient {Inpatient:23812}   Terry LOISE Hurst MD  [facility] Pager [phone]  If 7PM-7AM, please contact night-coverage [email or web address] Password [email or web address]  01/04/2024, 11:03 PM

## 2024-01-04 NOTE — Sepsis Progress Note (Signed)
 Sepsis protocol is being followed by [facility].

## 2024-01-04 NOTE — ED Notes (Signed)
 Pt also received 4 mg of zofran  from ems

## 2024-01-04 NOTE — ED Triage Notes (Signed)
 Pt here from home with c/o n/v and syncopal episode  while sitting in a w/c , b/p was in the 79' s received 500 of fluids with an increase of b/p

## 2024-01-04 NOTE — H&P (Incomplete)
 History and Physical  Mark [name] FMW:[id number] DOB: 07/15/1945 DOA: 01/04/2024  Referring physician: Edie Einstein, PA-EDP  PCP: Teresa Channel, MD  Outpatient Specialists: Oncology. Patient coming from: Home.  Chief Complaint: Passed out   HPI: Mark [name] is a 78 y.o. male with medical history significant for CVA with residual left-sided deficits, ITP, lupus anticoagulant, neurogenic bladder, seizure disorder, who presents with an episode of syncope.  The patient was sitting down eating with his wife when suddenly he became unresponsive.  His eyes became fixated for 15-20 seconds, he slumped over and had projectile vomiting.  EMS was activated.  Upon EMS arrival, the patient was hypotensive with SBP in the 80s.    In the ER, tachypneic.  CT angio chest revealed no evidence of pulmonary embolism however showed multifocal infiltrates in the left upper and lower lobes.  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  Emphysema.  Aortic atherosclerosis.  EDP discussed the case with vascular surgery.  Dr. Sheree recommended to continue current home DOAC.  The patient is on Xarelto  prior to admission.  UA positive for pyuria.  Infiltrates on imaging suggestive of pneumonia with possible aspiration.  Urine culture and peripheral blood cultures x 2 obtained.  The patient received empiric IV antibiotics, Cefepime , IV Flagyl , and IV vancomycin .  ED Course: Temperature 97.7.  BP 116/73, pulse 93, respiration rate 24, O2 saturation 100% on room air.  Lab studies notable for glucose 144, BUN 37, creatinine 1.70.  WBC 15.3, hemoglobin 12.  Platelet count 42.  Review of Systems: Review of systems as noted in the HPI. All other systems reviewed and are negative.   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    Lupus anticoagulant disorder (HCC)    Lupus anticoagulant syndrome (HCC)    Multiple closed anterior-posterior compression fractures of pelvis (HCC)    Seizures (HCC)    last seizure 10 years ago approx    Sleep apnea    cpap broken x 1 year per wife    Stroke (HCC) 02/27/2011   Left side weakness   Past Surgical History:  Procedure Laterality Date   blood clot Right    surgical removal   CHOLECYSTECTOMY     CORONARY ANGIOPLASTY  1992   CYSTOGRAM N/A 02/17/2020   Procedure: CYSTOGRAM;  Surgeon: Cam Morene ORN, MD;  Location: [facility];  Service: Urology;  Laterality: N/A;   CYSTOSCOPY WITH RETROGRADE PYELOGRAM, URETEROSCOPY AND STENT PLACEMENT Bilateral 02/17/2020   Procedure: REATHER SHIRLY CHARS  URETEROSCOPY AND STENT PLACEMENT;  Surgeon: Cam Morene ORN, MD;  Location: [facility];  Service: Urology;  Laterality: Bilateral;   SPLENECTOMY, TOTAL     vertebralplasty      Social History:  reports that he quit smoking about 38 years ago. His smoking use included cigarettes. He started smoking about 55 years ago. He has a 16.6 pack-year smoking history. He has never used  smokeless tobacco. He reports that he does not currently use drugs. He reports that he does not drink alcohol .   Allergies  Allergen Reactions   Benadryl  [Diphenhydramine ] Other (See Comments)    Affects kidneys   Pepcid  [Famotidine ] Other (See Comments)    Affects kidneys   Beef (Diagnostic) Other (See Comments)    Vegetarian   Chicken Meat (Diagnostic) Other (See Comments)    Vegetarian    Food Other (See Comments)    Animal meat product - pt is  vegetarian   Pork (Diagnostic) Other (See Comments)    Vegetarian     Family history: None reported.  Prior to Admission medications   Medication Sig Start Date End Date Taking? Authorizing Provider  alendronate (FOSAMAX) 70 MG tablet Take 70 mg by mouth once a week. 10/24/23   [provider]  amLODipine  (NORVASC ) 10 MG tablet Take 10 mg by mouth daily. 12/09/23   [provider]  aspirin  EC 81 MG tablet Take 81 mg by mouth daily. Swallow whole.    [provider]  baclofen  (LIORESAL ) 10 MG tablet Take 10 mg by mouth in the morning and at bedtime.    [provider]  buPROPion  (WELLBUTRIN  XL) 150 MG 24 hr tablet Take 150 mg by mouth in the morning. 12/19/14   [provider]  Cholecalciferol  (VITAMIN D3) 50 MCG (2000 UT) TABS Take 2,000 Units by mouth at bedtime.    [provider]  dexamethasone  (DECADRON ) 4 MG tablet TAKE 10 TABLETS BY MOUTH ONCE DAILY AS NEEDED FOR  3  DAYS  FOR  ITP 10/13/23   [name], MD  HYDROcodone -acetaminophen  (NORCO) 10-325 MG tablet Take 1 tablet by mouth 2 (two) times daily as needed for moderate pain (pain score 4-6). 11/20/23   [provider]  levETIRAcetam  (KEPPRA ) 500 MG tablet Take 1,500 mg by mouth in the morning and at bedtime. 12/24/11   [provider]  melatonin 5 MG TABS Take 1 tablet (5 mg total) by mouth at bedtime as needed. 07/21/23   Love, [name], PA-C  omeprazole (PRILOSEC) 40 MG capsule Take 40 mg by mouth daily before breakfast. 08/01/19   [provider]  polyethylene glycol (MIRALAX  / GLYCOLAX ) 17 g packet Take 17 g by mouth 2 (two) times daily. 08/12/23   Love, [name], PA-C  rivaroxaban  (XARELTO ) 20 MG TABS tablet Take 1 tablet (20 mg total) by mouth daily with supper. 07/21/23   Love, [name], PA-C  rosuvastatin  (CRESTOR ) 40 MG tablet Take 40 mg by mouth daily.    [provider]  sertraline  (ZOLOFT ) 100 MG tablet Take 200 mg by mouth in the morning.      [provider]  THERATEARS 0.25 % SOLN Place 1 drop into both eyes 2 (two) times daily as needed (for redness or irritation).    [provider]  TYLENOL  500 MG tablet Take 500 mg by mouth See admin instructions. Take 500 mg by mouth at bedtime and an additional 500 mg once a day as needed for pain or headaches    [provider]    Physical Exam: BP 112/65   Pulse 88   Temp 97.7 F (36.5 C) (Oral)   Resp 15   SpO2 99%   General: 78 y.o. year-old male well developed well nourished in no acute distress.  Alert and oriented x3. Cardiovascular: Regular rate and rhythm with no rubs or gallops.  No thyromegaly or JVD noted.  No  lower extremity edema. 2/4 pulses in all 4 extremities. Respiratory: Mild rales at bases. Good inspiratory effort. Abdomen: Soft nontender nondistended with normal bowel sounds x4 quadrants. Muskuloskeletal: No cyanosis, clubbing or edema noted bilaterally Neuro: CN II-XII intact, strength, sensation, reflexes Skin: No ulcerative lesions noted or rashes Psychiatry: Judgement and insight appear normal. Mood is appropriate for condition and setting          Labs on Admission:  Basic Metabolic Panel: Recent Labs  Lab 01/04/24 1729 01/04/24 1750  NA 141 142  K 4.6 5.1  CL 110 110  CO2 19*  --   GLUCOSE 139* 144*  BUN 27* 37*  CREATININE 1.62* 1.70*  CALCIUM  8.6*  --    Liver Function Tests: Recent Labs  Lab 01/04/24 1729  AST 29  ALT 28  ALKPHOS 79  BILITOT 0.7  PROT 6.8  ALBUMIN 3.5   Recent Labs  Lab 01/04/24 1729  LIPASE 22   No results for input(s): AMMONIA in the last 168 hours. CBC: Recent Labs  Lab 01/04/24 1729 01/04/24 1750  WBC 15.2*  --   NEUTROABS 11.6*  --   HGB 12.0* 12.2*  HCT 39.0 36.0*  MCV 85.0  --   PLT 42*  --    Cardiac Enzymes: No results for input(s): CKTOTAL, CKMB, CKMBINDEX, TROPONINI in the last 168 hours.  BNP (last 3 results) No results for input(s): BNP in the  last 8760 hours.  ProBNP (last 3 results) No results for input(s): PROBNP in the last 8760 hours.  CBG: No results for input(s): GLUCAP in the last 168 hours.  Radiological Exams on Admission: CT Angio Chest PE W and/or Wo Contrast Result Date: 01/04/2024 CLINICAL DATA:  Questionable sepsis, syncopal episode, previous history of pulmonary embolism and with hypotension. EXAM: CT ANGIOGRAPHY CHEST CT ABDOMEN AND PELVIS WITH CONTRAST TECHNIQUE: Multidetector CT imaging of the chest was performed using the standard protocol during bolus administration of intravenous contrast. Multiplanar CT image reconstructions and MIPs were obtained to evaluate the vascular anatomy. Multidetector CT imaging of the abdomen and pelvis was performed using the standard protocol during bolus administration of intravenous contrast. RADIATION DOSE REDUCTION: This exam was performed according to the departmental dose-optimization program which includes automated exposure control, adjustment of the mA and/or kV according to patient size and/or use of iterative reconstruction technique. CONTRAST:  75mL OMNIPAQUE  IOHEXOL  350 MG/ML SOLN COMPARISON:  Portable chest from today, portable chest 07/28/2023, and chest CTs without contrast 10/29/2019 and 05/09/2017. Abdomen pelvis CT compared with CT abdomen and pelvis without and with contrast 02/03/2020 and 08/27/2007. FINDINGS: CTA CHEST FINDINGS Cardiovascular: There is diagnostic pulmonary arterial opacification. No arterial dilatation or embolus is seen. The pulmonary veins are normal. The cardiac size is normal. There is no pericardial effusion. There are left main scattered single-vessel calcific plaques of the LAD coronary artery. There is mild aortic tortuosity with scattered calcific plaques in the aorta and great vessels. No aneurysm, stenosis or dissection. Mediastinum/Nodes: Small hiatal hernia. There is a patulous esophagus, interval new mild to moderate distal esophageal  wall thickening warranting consideration of endoscopic follow-up. The trachea and main bronchi are clear. There are slightly prominent right hilar lymph nodes up to 1 cm short axis, borderline prominent subcarinal and precarinal lymph nodes no further adenopathy. Axillary regions are clear. Thyroid  gland unremarkable. Lungs/Pleura: There is patchy hazy airspace disease in the anterior segment of the left upper lobe, small infiltrate in the infrahilar lingula with subsegmental bronchial impaction, and denser patchy  consolidation in the posterior basal left lower lobe. Findings consistent with multilobar pneumonia. A follow-up study is recommended after treatment to ensure clearing. There is diffuse bronchial thickening, interval increased widespread mosaicism consistent with air trapping with small airways disease. There are mild paraseptal emphysematous changes in both upper lobes, apical reticulated scar-like opacities are again noted. 9 mm stable solid right middle on 7:70 is presumed benign due to length of stability. There are scattered linear scar-like opacities both bases. No pleural effusions. Musculoskeletal: Osteopenia. There is chronic wedging and kyphoplasty cement in the T11 and 12 vertebral bodies. Degenerative changes lumbar spine. No acute or other significant osseous findings. Healed fractures are noted of some of the lateral left ribs. Unremarkable visualized chest wall. Review of the MIP images confirms the above findings. CT ABDOMEN and PELVIS FINDINGS Hepatobiliary: Respiratory motion limits fine detail. No focal abnormality is seen through the motion artifact. The gallbladder again noted absent without biliary ductal dilatation. Pancreas: Partially atrophic. No focal abnormality is seen through the motion artifact. Spleen: Surgically absent. Adrenals/Urinary Tract: Cortical thinning in renal volume loss right-greater-than-left, are again noted, some progression noted since 2021. No mass  enhancement of the kidneys is seen through the motion artifact. There is symmetric contrast uptake and excretion. There are Bosniak 2 subcentimeter cortical cysts which are too small to characterize. Some of these are new since 2021. No follow-up imaging is required. There is no urinary stone or obstruction. The bladder is diffusely thickened but also not fully distended. Correlate clinically for cystitis versus muscular hypertrophy. Stomach/Bowel: Fluid filling in the stomach. No overt wall thickening. No small bowel dilatation. Normal appendix. There is mild-to-moderate retained stool in the transverse colon. There is a nonobstructed short loop of the mid descending colon extending into a lateral abdominal wall hernia sac. No hernia incarceration is suspected. There is no wall thickening or dilatation of the colon, but there is a large amount of stool in the rectosigmoid segment. There are no findings of acute stercoral proctitis but disimpaction is likely indicated. Vascular/Lymphatic: Moderate aortoiliac atherosclerosis. 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 No adenopathy is seen. Reproductive: Mild prostatomegaly. Probable phleboliths. Both testicles are in the scrotal sac. There are postsurgical changes of prior vasectomy. Other: No free fluid, free hemorrhage or free air. Musculoskeletal: Osteopenia. There are mild chronic central endplate compression fractures at L2 and 4. 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 No new osseous abnormality.  No acute skeletal findings. Review of the MIP images confirms the above findings. IMPRESSION: 1. No evidence of pulmonary arterial dilatation or embolus.  2. Aortic and coronary artery atherosclerosis. 3. Multilobar pneumonia left upper and lower lobes. Follow-up study recommended after treatment to ensure clearing. 4. Diffuse bronchial thickening with increased mosaicism consistent with air trapping and small airways disease. 5. Mildly prominent right hilar lymph nodes, probably reactive. 6. Patulous esophagus with interval new mild to moderate distal esophageal wall thickening. Consider endoscopic follow-up. 7. Constipation with large amount of stool in the rectosigmoid segment. No findings of stercoral proctitis but disimpaction is likely indicated. 8. Left lateral abdominal wall hernia containing a short loop of the mid descending colon without evidence of incarceration or obstruction. 9. Cystitis or bladder hypertrophy versus bladder nondistention.  10. Osteopenia and degenerative change. Chronic compression fractures of T11 and T12 with kyphoplasty cement. 11. 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 12. Emphysema. Aortic Atherosclerosis (ICD10-I70.0) and Emphysema (ICD10-J43.9). Electronically Signed   By: Francis Quam M.D.   On: 01/04/2024 22:22   CT ABDOMEN PELVIS W CONTRAST Result Date: 01/04/2024 CLINICAL DATA:  Questionable sepsis, syncopal episode, previous history of pulmonary embolism and with hypotension. EXAM: CT ANGIOGRAPHY CHEST CT ABDOMEN AND PELVIS WITH CONTRAST TECHNIQUE: Multidetector CT imaging of the chest was performed using the standard protocol during bolus administration of intravenous contrast. Multiplanar CT image reconstructions and MIPs were obtained to evaluate the vascular anatomy. Multidetector CT imaging of the abdomen and pelvis was performed using the standard protocol during bolus administration of intravenous contrast. RADIATION DOSE REDUCTION:  This exam was performed according to the departmental dose-optimization program which includes automated exposure control, adjustment of the mA and/or kV according to patient size and/or use of iterative reconstruction technique. CONTRAST:  75mL OMNIPAQUE  IOHEXOL  350 MG/ML SOLN COMPARISON:  Portable chest from today, portable chest 07/28/2023, and chest CTs without contrast 10/29/2019 and 05/09/2017. Abdomen pelvis CT compared with CT abdomen and pelvis without and with contrast 02/03/2020 and 08/27/2007. FINDINGS: CTA CHEST FINDINGS Cardiovascular: There is diagnostic pulmonary arterial opacification. No arterial dilatation or embolus is seen. The pulmonary veins are normal. The cardiac size is normal. There is no pericardial effusion. There are left main scattered single-vessel calcific plaques of the LAD coronary artery. There is mild aortic tortuosity with scattered calcific plaques in the aorta and great vessels. No aneurysm, stenosis or dissection. Mediastinum/Nodes: Small hiatal hernia. There is a patulous esophagus, interval new mild to moderate distal esophageal wall thickening warranting consideration of endoscopic follow-up. The trachea and main bronchi are clear. There are slightly prominent right hilar lymph nodes up to 1 cm short axis, borderline prominent subcarinal and precarinal lymph nodes no further adenopathy. Axillary regions are clear. Thyroid  gland unremarkable. Lungs/Pleura: There is patchy hazy airspace disease in the anterior segment of the left upper lobe, small infiltrate in the infrahilar lingula with subsegmental bronchial impaction, and denser patchy consolidation in the posterior basal left lower lobe. Findings consistent with multilobar pneumonia. A follow-up study is recommended after treatment to ensure clearing. There is diffuse bronchial thickening, interval increased widespread mosaicism consistent with air trapping with small airways disease. There are mild paraseptal  emphysematous changes in both upper lobes, apical reticulated scar-like opacities are again noted. 9 mm stable solid right middle on 7:70 is presumed benign due to length of stability. There are scattered linear scar-like opacities both bases. No pleural effusions. Musculoskeletal: Osteopenia. There is chronic wedging and kyphoplasty cement in the T11 and 12 vertebral bodies. Degenerative changes lumbar spine. No acute or other significant osseous findings. Healed fractures are noted of some of the lateral left ribs. Unremarkable visualized chest wall. Review of the MIP images confirms the above findings. CT ABDOMEN and PELVIS FINDINGS Hepatobiliary: Respiratory motion limits fine detail. No focal abnormality is seen through the motion artifact. The gallbladder again noted absent without biliary ductal dilatation. Pancreas: Partially atrophic. No focal abnormality is seen through the motion artifact. Spleen: Surgically absent. Adrenals/Urinary Tract: Cortical thinning in renal volume loss right-greater-than-left, are again noted, some progression noted since 2021. No mass enhancement of the kidneys is seen through the motion artifact. There  is symmetric contrast uptake and excretion. There are Bosniak 2 subcentimeter cortical cysts which are too small to characterize. Some of these are new since 2021. No follow-up imaging is required. There is no urinary stone or obstruction. The bladder is diffusely thickened but also not fully distended. Correlate clinically for cystitis versus muscular hypertrophy. Stomach/Bowel: Fluid filling in the stomach. No overt wall thickening. No small bowel dilatation. Normal appendix. There is mild-to-moderate retained stool in the transverse colon. There is a nonobstructed short loop of the mid descending colon extending into a lateral abdominal wall hernia sac. No hernia incarceration is suspected. There is no wall thickening or dilatation of the colon, but there is a large amount of  stool in the rectosigmoid segment. There are no findings of acute stercoral proctitis but disimpaction is likely indicated. Vascular/Lymphatic: Moderate aortoiliac atherosclerosis. 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 No adenopathy is seen. Reproductive: Mild prostatomegaly. Probable phleboliths. Both testicles are in the scrotal sac. There are postsurgical changes of prior vasectomy. Other: No free fluid, free hemorrhage or free air. Musculoskeletal: Osteopenia. There are mild chronic central endplate compression fractures at L2 and 4. 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 No new osseous abnormality.  No acute skeletal findings. Review of the MIP images confirms the above findings. IMPRESSION: 1. No evidence of pulmonary arterial dilatation or embolus. 2. Aortic and coronary artery atherosclerosis. 3. Multilobar pneumonia left upper and lower lobes. Follow-up study recommended after treatment to ensure clearing. 4. Diffuse bronchial thickening with increased mosaicism consistent with air trapping and small airways disease. 5. Mildly prominent right hilar lymph nodes, probably reactive. 6. Patulous esophagus with interval new mild to moderate distal esophageal wall thickening. Consider endoscopic follow-up. 7. Constipation with large amount of stool in the rectosigmoid segment. No findings of stercoral proctitis but disimpaction is likely indicated. 8. Left lateral abdominal wall hernia containing a short loop of the mid descending colon without evidence of incarceration or obstruction. 9. Cystitis or bladder hypertrophy versus bladder nondistention. 10. Osteopenia and degenerative change. Chronic  compression fractures of T11 and T12 with kyphoplasty cement. 11. 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 12. Emphysema. Aortic Atherosclerosis (ICD10-I70.0) and Emphysema (ICD10-J43.9). Electronically Signed   By: Francis Quam M.D.   On: 01/04/2024 22:22   CT Head Wo Contrast Result Date: 01/04/2024 EXAM: CT HEAD WITHOUT CONTRAST 01/04/2024 08:58:03 PM TECHNIQUE: CT of the head was performed without the administration of intravenous contrast. Automated exposure control, iterative reconstruction, and/or weight based adjustment of the mA/kV was utilized to reduce the radiation dose to as low as reasonably achievable. COMPARISON: 01/07/2020 CLINICAL HISTORY: Syncope/presyncope, cerebrovascular cause suspected. FINDINGS: BRAIN AND VENTRICLES: Old right MCA distribution infarct with encephalomalacic changes and ex vacuo dilatation of the right lateral ventricle. No acute hemorrhage. No evidence of acute infarct. No hydrocephalus. No extra-axial collection. No mass effect or midline shift. Mild age-related atrophy. Subcortical and periventricular small vessel ischemic changes. ORBITS: No acute abnormality. SINUSES: No acute abnormality. SOFT TISSUES AND SKULL: No acute soft tissue abnormality. No skull fracture. Mild intracranial atherosclerosis. IMPRESSION: 1.  No acute intracranial abnormality. 2. Old right MCA territory infarct. Small vessel ischemic changes. Electronically signed by: Pinkie Pebbles MD 01/04/2024 09:04 PM EDT RP Workstation: [id number]   DG Chest Port 1 View Result Date: 01/04/2024 CLINICAL DATA:  Questionable sepsis - evaluate for abnormality EXAM: PORTABLE CHEST 1 VIEW COMPARISON:  07/28/2023 FINDINGS: Heart and mediastinal contours are within normal limits. No focal opacities or effusions. No acute bony  abnormality. No pneumothorax. IMPRESSION: No active disease. Electronically Signed   By: Franky Crease M.D.   On: 01/04/2024 18:14    EKG: I independently viewed the EKG done and my findings are as followed: Sinus rhythm of 79.  Nonspecific ST-T changes but QTc 453.  Assessment/Plan Present on Admission:  Sepsis (HCC)  Principal Problem:   Sepsis (HCC)  Sepsis secondary to UTI, multifocal pneumonia, POA Presented with leukocytosis, tachypnea, lactic acid 2.1 Code sepsis called in the ER Follow urine culture and peripheral blood cultures x 2 Received in the ER empiric IV antibiotics cefepime , IV Flagyl , vancomycin . Continue cefepime  and IV Flagyl  for now Repeat CBC in the morning, follow baseline procalcitonin, MRSA screening test.  Syncope Hypotensive on EMS arrival Rule out other causes of syncope due to high risk Follow transthoracic echocardiogram.  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  Emphysema.  Aortic atherosclerosis. Continue home Xarelto , per vascular surgery  AKI on CKD 3A, suspect prerenal in setting of dehydration Creatinine at baseline 1.3 with GFR 52 Presented with creatinine 1.70 with GFR of 43 For nephrotoxic agents, dehydration, and hypotension Monitor urine output Repeat BMP in the morning.  ITP Platelet count 42K No reported overt bleeding. IV Decadron  10 mg x1 Added Dr. Jacquelene to treatment team  Situational insomnia Melatonin PRN  Constipation seen on CT scan Senokot daily Miralax  PRN  History of CVA with residual left-sided deficits Uses walker with assistance and wheelchair. PT OT assessment Fall precautions  Seizure disorder Resume home regimen Seizure precautions    Critical care time: 55 minutes.    DVT prophylaxis: Home Xarelto   Code Status:  DNR  Family Communication: Updated the patient's wife at bedside.  Disposition Plan: Admitted to telemetry medical unit.  Consults called: EDP discussed the case with vascular surgeon.  Admission status: Inpatient status.   Status is: Inpatient The patient requires at least 2 midnights for further evaluation and treatment of present condition.   Mark LOISE Hurst MD [facility] Pager [phone]  If 7PM-7AM, please contact night-coverage [email or web address] Password [email or web address]  01/04/2024, 11:03 PM

## 2024-01-04 NOTE — ED Provider Notes (Signed)
 [place] EMERGENCY DEPARTMENT AT [facility] Provider Note   CSN: [id number] Arrival date & time: 01/04/24  1711    Patient presents with: Loss of Consciousness   Mark [name] is a 78 y.o. male history of prior CVA with left-sided deficits, ITP, lupus anticoagulant on chronic anticoagulation here for evaluation of syncope.  He states he was sitting in his wheelchair when he went out.  He does not remember the incident however does remember waking up and he had vomited.  According to EMS family stated he just slumped over.  He has been doing otherwise well.  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  On EMS arrival patient hypotensive into the 80s.  No postictal period.  No bowel or bladder incontinence he does In-N-Out catheterize at least twice daily.  He denies any tongue injury, seizure-like activity.  He has history of neurogenic bladder.  He received IV fluids with EMS.  He denies any new headache, numbness, weakness, chest pain, shortness of breath, back pain. No increased bleeding.  Does note a new cough since his syncopal episode.  Denies any melena, bright red blood per rectum  Primarily wheelchair-bound, can stand to pivot, take a few steps with walker  According to EMS patient had a run of bigeminy where he felt lightheaded during transport    HPI     Prior to Admission medications   Medication Sig Start Date End Date Taking? Authorizing Provider  alendronate (FOSAMAX) 70 MG tablet Take 70 mg by mouth once a week. 10/24/23   [provider]  amLODipine  (NORVASC ) 10 MG tablet Take 10 mg by mouth daily. 12/09/23   [provider]  aspirin  EC 81 MG tablet Take 81 mg by mouth daily. Swallow whole.    [provider]  baclofen  (LIORESAL ) 10 MG tablet Take 10 mg by mouth in the morning and at bedtime.    [provider]  buPROPion  (WELLBUTRIN  XL) 150 MG 24  hr tablet Take 150 mg by mouth in the morning. 12/19/14   [provider]  Cholecalciferol  (VITAMIN D3) 50 MCG (2000 UT) TABS Take 2,000 Units by mouth at bedtime.    [provider]  dexamethasone  (DECADRON ) 4 MG tablet TAKE 10 TABLETS BY MOUTH ONCE DAILY AS NEEDED FOR  3  DAYS  FOR  ITP 10/13/23   [name], MD  HYDROcodone -acetaminophen  (NORCO) 10-325 MG tablet Take 1 tablet by mouth 2 (two) times daily as needed for moderate pain (pain score 4-6). 11/20/23   [provider]  levETIRAcetam  (KEPPRA ) 500 MG tablet Take 1,500 mg by mouth in the morning and at bedtime. 12/24/11   [provider]  melatonin 5 MG TABS Take 1 tablet (5 mg total) by mouth at bedtime as needed. 07/21/23   Love, [name], PA-C  omeprazole (PRILOSEC) 40 MG capsule Take 40 mg by mouth daily before breakfast. 08/01/19   [provider]  polyethylene glycol (MIRALAX  / GLYCOLAX ) 17 g packet Take 17 g by mouth 2 (two) times daily. 08/12/23   Love, [name], PA-C  rivaroxaban  (XARELTO ) 20 MG TABS tablet Take 1 tablet (20 mg total) by mouth daily with supper. 07/21/23   Love, [name], PA-C  rosuvastatin  (CRESTOR ) 40 MG tablet Take 40 mg by mouth daily.    [provider]  sertraline  (ZOLOFT ) 100 MG tablet Take 200 mg by mouth in  the morning.     [provider]  THERATEARS 0.25 % SOLN Place 1 drop into both eyes 2 (two) times daily as needed (for redness or irritation).    [provider]  TYLENOL  500 MG tablet Take 500 mg by mouth See admin instructions. Take 500 mg by mouth at bedtime and an additional 500 mg once a day as needed for pain or headaches    [provider]    Allergies: Benadryl  [diphenhydramine ], Pepcid  [famotidine ], Beef (diagnostic), Chicken meat (diagnostic), Food, and Pork (diagnostic)    Review of Systems  HENT: Negative.    Respiratory:  Positive for cough.   Cardiovascular: Negative.   Gastrointestinal:  Positive for nausea and  vomiting. Negative for abdominal distention, abdominal pain, anal bleeding, blood in stool, constipation, diarrhea and rectal pain.  Musculoskeletal: Negative.   Skin: Negative.   Neurological:  Positive for syncope.  All other systems reviewed and are negative.   Updated Vital Signs BP 112/65   Pulse 88   Temp 97.7 F (36.5 C) (Oral)   Resp 15   SpO2 99%   Physical Exam Vitals and nursing note reviewed.  Constitutional:      General: He is not in acute distress.    Appearance: He is well-developed. He is not toxic-appearing or diaphoretic.     Comments: Chronically ill appearing  HENT:     Head: Atraumatic.     Nose: Nose normal.     Mouth/Throat:     Mouth: Mucous membranes are moist.  Eyes:     Pupils: Pupils are equal, round, and reactive to light.  Cardiovascular:     Rate and Rhythm: Normal rate and regular rhythm.     Pulses:          Radial pulses are 2+ on the right side and 2+ on the left side.       Dorsalis pedis pulses are detected w/ Doppler on the right side and detected w/ Doppler on the left side.     Heart sounds: Normal heart sounds.  Pulmonary:     Effort: Pulmonary effort is normal. No respiratory distress.     Comments: Coarse lung sounds, hypoxic to 88% on room air Abdominal:     General: There is no distension.     Palpations: Abdomen is soft.     Tenderness: There is no abdominal tenderness. There is no right CVA tenderness, left CVA tenderness, guarding or rebound.  Musculoskeletal:        General: Normal range of motion.     Cervical back: Normal range of motion and neck supple.     Right lower leg: No edema.     Left lower leg: No edema.     Comments: No point tenderness, compartment soft  Skin:    General: Skin is warm and dry.  Neurological:     Mental Status: He is alert and oriented to person, place, and time.     Gait: Gait normal.     Comments: Weakness left upper extremity, left lower extremity especially prior stroke     (all  labs ordered are listed, but only abnormal results are displayed) Labs Reviewed  COMPREHENSIVE METABOLIC PANEL WITH GFR - Abnormal; Notable for the following components:      Result Value   CO2 19 (*)    Glucose, Bld 139 (*)    BUN 27 (*)    Creatinine, Ser 1.62 (*)    Calcium  8.6 (*)    GFR,  Estimated 43 (*)    All other components within normal limits  CBC WITH DIFFERENTIAL/PLATELET - Abnormal; Notable for the following components:   WBC 15.2 (*)    Hemoglobin 12.0 (*)    RDW 19.5 (*)    Platelets 42 (*)    Neutro Abs 11.6 (*)    Eosinophils Absolute 0.8 (*)    Abs Immature Granulocytes 0.10 (*)    All other components within normal limits  PROTIME-INR - Abnormal; Notable for the following components:   Prothrombin Time 26.5 (*)    INR 2.3 (*)    All other components within normal limits  D-DIMER, QUANTITATIVE - Abnormal; Notable for the following components:   D-Dimer, Quant 0.77 (*)    All other components within normal limits  APTT - Abnormal; Notable for the following components:   aPTT 63 (*)    All other components within normal limits  URINALYSIS, W/ REFLEX TO CULTURE (INFECTION SUSPECTED) - Abnormal; Notable for the following components:   APPearance CLOUDY (*)    Protein, ur 100 (*)    Leukocytes,Ua LARGE (*)    All other components within normal limits  LACTATE DEHYDROGENASE - Abnormal; Notable for the following components:   LDH 226 (*)    All other components within normal limits  I-STAT CG4 LACTIC ACID, ED - Abnormal; Notable for the following components:   Lactic Acid, Venous 2.1 (*)    All other components within normal limits  I-STAT CHEM 8, ED - Abnormal; Notable for the following components:   BUN 37 (*)    Creatinine, Ser 1.70 (*)    Glucose, Bld 144 (*)    Calcium , Ion 1.12 (*)    Hemoglobin 12.2 (*)    HCT 36.0 (*)    All other components within normal limits  RESP PANEL BY RT-PCR (RSV, FLU A&B, COVID)  RVPGX2  CULTURE, BLOOD (ROUTINE X 2)   CULTURE, BLOOD (ROUTINE X 2)  URINE CULTURE  LIPASE, BLOOD  CBC  BASIC METABOLIC PANEL WITH GFR  MAGNESIUM   PHOSPHORUS  I-STAT CG4 LACTIC ACID, ED  TROPONIN I (HIGH SENSITIVITY)  TROPONIN I (HIGH SENSITIVITY)    EKG: None  Radiology: CT Angio Chest PE W and/or Wo Contrast Result Date: 01/04/2024 CLINICAL DATA:  Questionable sepsis, syncopal episode, previous history of pulmonary embolism and with hypotension. EXAM: CT ANGIOGRAPHY CHEST CT ABDOMEN AND PELVIS WITH CONTRAST TECHNIQUE: Multidetector CT imaging of the chest was performed using the standard protocol during bolus administration of intravenous contrast. Multiplanar CT image reconstructions and MIPs were obtained to evaluate the vascular anatomy. Multidetector CT imaging of the abdomen and pelvis was performed using the standard protocol during bolus administration of intravenous contrast. RADIATION DOSE REDUCTION: This exam was performed according to the departmental dose-optimization program which includes automated exposure control, adjustment of the mA and/or kV according to patient size and/or use of iterative reconstruction technique. CONTRAST:  75mL OMNIPAQUE  IOHEXOL  350 MG/ML SOLN COMPARISON:  Portable chest from today, portable chest 07/28/2023, and chest CTs without contrast 10/29/2019 and 05/09/2017. Abdomen pelvis CT compared with CT abdomen and pelvis without and with contrast 02/03/2020 and 08/27/2007. FINDINGS: CTA CHEST FINDINGS Cardiovascular: There is diagnostic pulmonary arterial opacification. No arterial dilatation or embolus is seen. The pulmonary veins are normal. The cardiac size is normal. There is no pericardial effusion. There are left main scattered single-vessel calcific plaques of the LAD coronary artery. There is mild aortic tortuosity with scattered calcific plaques in the aorta and great vessels. No aneurysm,  stenosis or dissection. Mediastinum/Nodes: Small hiatal hernia. There is a patulous esophagus,  interval new mild to moderate distal esophageal wall thickening warranting consideration of endoscopic follow-up. The trachea and main bronchi are clear. There are slightly prominent right hilar lymph nodes up to 1 cm short axis, borderline prominent subcarinal and precarinal lymph nodes no further adenopathy. Axillary regions are clear. Thyroid  gland unremarkable. Lungs/Pleura: There is patchy hazy airspace disease in the anterior segment of the left upper lobe, small infiltrate in the infrahilar lingula with subsegmental bronchial impaction, and denser patchy consolidation in the posterior basal left lower lobe. Findings consistent with multilobar pneumonia. A follow-up study is recommended after treatment to ensure clearing. There is diffuse bronchial thickening, interval increased widespread mosaicism consistent with air trapping with small airways disease. There are mild paraseptal emphysematous changes in both upper lobes, apical reticulated scar-like opacities are again noted. 9 mm stable solid right middle on 7:70 is presumed benign due to length of stability. There are scattered linear scar-like opacities both bases. No pleural effusions. Musculoskeletal: Osteopenia. There is chronic wedging and kyphoplasty cement in the T11 and 12 vertebral bodies. Degenerative changes lumbar spine. No acute or other significant osseous findings. Healed fractures are noted of some of the lateral left ribs. Unremarkable visualized chest wall. Review of the MIP images confirms the above findings. CT ABDOMEN and PELVIS FINDINGS Hepatobiliary: Respiratory motion limits fine detail. No focal abnormality is seen through the motion artifact. The gallbladder again noted absent without biliary ductal dilatation. Pancreas: Partially atrophic. No focal abnormality is seen through the motion artifact. Spleen: Surgically absent. Adrenals/Urinary Tract: Cortical thinning in renal volume loss right-greater-than-left, are again noted,  some progression noted since 2021. No mass enhancement of the kidneys is seen through the motion artifact. There is symmetric contrast uptake and excretion. There are Bosniak 2 subcentimeter cortical cysts which are too small to characterize. Some of these are new since 2021. No follow-up imaging is required. There is no urinary stone or obstruction. The bladder is diffusely thickened but also not fully distended. Correlate clinically for cystitis versus muscular hypertrophy. Stomach/Bowel: Fluid filling in the stomach. No overt wall thickening. No small bowel dilatation. Normal appendix. There is mild-to-moderate retained stool in the transverse colon. There is a nonobstructed short loop of the mid descending colon extending into a lateral abdominal wall hernia sac. No hernia incarceration is suspected. There is no wall thickening or dilatation of the colon, but there is a large amount of stool in the rectosigmoid segment. There are no findings of acute stercoral proctitis but disimpaction is likely indicated. Vascular/Lymphatic: Moderate aortoiliac atherosclerosis. 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 No adenopathy is seen. Reproductive: Mild prostatomegaly. Probable phleboliths. Both testicles are in the scrotal sac. There are postsurgical changes of prior vasectomy. Other: No free fluid, free hemorrhage or free air. Musculoskeletal: Osteopenia. There are mild chronic central endplate compression fractures at L2 and 4. 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 No new osseous abnormality.  No acute skeletal findings. Review of the MIP images confirms the above findings. IMPRESSION: 1. No evidence of  pulmonary arterial dilatation or embolus. 2. Aortic and coronary artery atherosclerosis. 3. Multilobar pneumonia left upper and lower lobes. Follow-up study recommended after  treatment to ensure clearing. 4. Diffuse bronchial thickening with increased mosaicism consistent with air trapping and small airways disease. 5. Mildly prominent right hilar lymph nodes, probably reactive. 6. Patulous esophagus with interval new mild to moderate distal esophageal wall thickening. Consider endoscopic follow-up. 7. Constipation with large amount of stool in the rectosigmoid segment. No findings of stercoral proctitis but disimpaction is likely indicated. 8. Left lateral abdominal wall hernia containing a short loop of the mid descending colon without evidence of incarceration or obstruction. 9. Cystitis or bladder hypertrophy versus bladder nondistention. 10. Osteopenia and degenerative change. Chronic compression fractures of T11 and T12 with kyphoplasty cement. 11. 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 12. Emphysema. Aortic Atherosclerosis (ICD10-I70.0) and Emphysema (ICD10-J43.9). Electronically Signed   By: Francis Quam M.D.   On: 01/04/2024 22:22   CT ABDOMEN PELVIS W CONTRAST Result Date: 01/04/2024 CLINICAL DATA:  Questionable sepsis, syncopal episode, previous history of pulmonary embolism and with hypotension. EXAM: CT ANGIOGRAPHY CHEST CT ABDOMEN AND PELVIS WITH CONTRAST TECHNIQUE: Multidetector CT imaging of the chest was performed using the standard protocol during bolus administration of intravenous contrast. Multiplanar CT image reconstructions and MIPs were obtained to evaluate the vascular anatomy. Multidetector CT imaging of the abdomen and pelvis was performed using the standard protocol during bolus administration of  intravenous contrast. RADIATION DOSE REDUCTION: This exam was performed according to the departmental dose-optimization program which includes automated exposure control, adjustment of the mA and/or kV according to patient size and/or use of iterative reconstruction technique. CONTRAST:  75mL OMNIPAQUE  IOHEXOL  350 MG/ML SOLN COMPARISON:  Portable chest from today, portable chest 07/28/2023, and chest CTs without contrast 10/29/2019 and 05/09/2017. Abdomen pelvis CT compared with CT abdomen and pelvis without and with contrast 02/03/2020 and 08/27/2007. FINDINGS: CTA CHEST FINDINGS Cardiovascular: There is diagnostic pulmonary arterial opacification. No arterial dilatation or embolus is seen. The pulmonary veins are normal. The cardiac size is normal. There is no pericardial effusion. There are left main scattered single-vessel calcific plaques of the LAD coronary artery. There is mild aortic tortuosity with scattered calcific plaques in the aorta and great vessels. No aneurysm, stenosis or dissection. Mediastinum/Nodes: Small hiatal hernia. There is a patulous esophagus, interval new mild to moderate distal esophageal wall thickening warranting consideration of endoscopic follow-up. The trachea and main bronchi are clear. There are slightly prominent right hilar lymph nodes up to 1 cm short axis, borderline prominent subcarinal and precarinal lymph nodes no further adenopathy. Axillary regions are clear. Thyroid  gland unremarkable. Lungs/Pleura: There is patchy hazy airspace disease in the anterior segment of the left upper lobe, small infiltrate in the infrahilar lingula with subsegmental bronchial impaction, and denser patchy consolidation in the posterior basal left lower lobe. Findings consistent with multilobar pneumonia. A follow-up study is recommended after treatment to ensure clearing. There is diffuse bronchial thickening, interval increased widespread mosaicism consistent with air trapping with small  airways disease. There are mild paraseptal emphysematous changes in both upper lobes, apical reticulated scar-like opacities are again noted. 9 mm stable solid right middle on 7:70 is presumed benign due to length of stability. There are scattered linear scar-like opacities both bases. No pleural effusions. Musculoskeletal: Osteopenia. There is chronic wedging and kyphoplasty cement in the T11 and 12 vertebral bodies. Degenerative changes lumbar spine. No acute or other significant osseous findings. Healed fractures are noted  of some of the lateral left ribs. Unremarkable visualized chest wall. Review of the MIP images confirms the above findings. CT ABDOMEN and PELVIS FINDINGS Hepatobiliary: Respiratory motion limits fine detail. No focal abnormality is seen through the motion artifact. The gallbladder again noted absent without biliary ductal dilatation. Pancreas: Partially atrophic. No focal abnormality is seen through the motion artifact. Spleen: Surgically absent. Adrenals/Urinary Tract: Cortical thinning in renal volume loss right-greater-than-left, are again noted, some progression noted since 2021. No mass enhancement of the kidneys is seen through the motion artifact. There is symmetric contrast uptake and excretion. There are Bosniak 2 subcentimeter cortical cysts which are too small to characterize. Some of these are new since 2021. No follow-up imaging is required. There is no urinary stone or obstruction. The bladder is diffusely thickened but also not fully distended. Correlate clinically for cystitis versus muscular hypertrophy. Stomach/Bowel: Fluid filling in the stomach. No overt wall thickening. No small bowel dilatation. Normal appendix. There is mild-to-moderate retained stool in the transverse colon. There is a nonobstructed short loop of the mid descending colon extending into a lateral abdominal wall hernia sac. No hernia incarceration is suspected. There is no wall thickening or dilatation of  the colon, but there is a large amount of stool in the rectosigmoid segment. There are no findings of acute stercoral proctitis but disimpaction is likely indicated. Vascular/Lymphatic: Moderate aortoiliac atherosclerosis. 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 No adenopathy is seen. Reproductive: Mild prostatomegaly. Probable phleboliths. Both testicles are in the scrotal sac. There are postsurgical changes of prior vasectomy. Other: No free fluid, free hemorrhage or free air. Musculoskeletal: Osteopenia. There are mild chronic central endplate compression fractures at L2 and 4. 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 No new osseous abnormality.  No acute skeletal findings. Review of the MIP images confirms the above findings. IMPRESSION: 1. No evidence of pulmonary arterial dilatation or embolus. 2. Aortic and coronary artery atherosclerosis. 3. Multilobar pneumonia left upper and lower lobes. Follow-up study recommended after treatment to ensure clearing. 4. Diffuse bronchial thickening with increased mosaicism consistent with air trapping and small airways disease. 5. Mildly prominent right hilar lymph nodes, probably reactive. 6. Patulous esophagus with interval new mild to moderate distal esophageal wall thickening. Consider endoscopic follow-up. 7. Constipation with large amount of stool in the rectosigmoid segment. No findings of stercoral proctitis but disimpaction is likely indicated. 8. Left lateral abdominal wall hernia containing a short loop of the mid descending colon without evidence of incarceration or obstruction. 9. Cystitis or bladder hypertrophy versus bladder nondistention. 10.  Osteopenia and degenerative change. Chronic compression fractures of T11 and T12 with kyphoplasty cement. 11. 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 12. Emphysema. Aortic Atherosclerosis (ICD10-I70.0) and Emphysema (ICD10-J43.9). Electronically Signed   By: Francis Quam M.D.   On: 01/04/2024 22:22   CT Head Wo Contrast Result Date: 01/04/2024 EXAM: CT HEAD WITHOUT CONTRAST 01/04/2024 08:58:03 PM TECHNIQUE: CT of the head was performed without the administration of intravenous contrast. Automated exposure control, iterative reconstruction, and/or weight based adjustment of the mA/kV was utilized  to reduce the radiation dose to as low as reasonably achievable. COMPARISON: 01/07/2020 CLINICAL HISTORY: Syncope/presyncope, cerebrovascular cause suspected. FINDINGS: BRAIN AND VENTRICLES: Old right MCA distribution infarct with encephalomalacic changes and ex vacuo dilatation of the right lateral ventricle. No acute hemorrhage. No evidence of acute infarct. No hydrocephalus. No extra-axial collection. No mass effect or midline shift. Mild age-related atrophy. Subcortical and periventricular small vessel ischemic changes. ORBITS: No acute abnormality. SINUSES: No acute abnormality. SOFT TISSUES AND SKULL: No acute soft tissue abnormality. No skull fracture. Mild intracranial atherosclerosis. IMPRESSION: 1. No acute intracranial abnormality. 2. Old right MCA territory infarct. Small vessel ischemic changes. Electronically signed by: Pinkie Pebbles MD 01/04/2024 09:04 PM EDT RP Workstation: [id number]   DG Chest Port 1 View Result Date: 01/04/2024 CLINICAL DATA:  Questionable sepsis - evaluate for abnormality EXAM: PORTABLE CHEST 1 VIEW COMPARISON:  07/28/2023 FINDINGS: Heart and mediastinal contours are within normal limits. No focal  opacities or effusions. No acute bony abnormality. No pneumothorax. IMPRESSION: No active disease. Electronically Signed   By: Franky Crease M.D.   On: 01/04/2024 18:14     .Critical Care  Performed by: Edie Rosebud LABOR, PA-C Authorized by: Edie Rosebud LABOR, PA-C   Critical care provider statement:    Critical care time (minutes):  76   Critical care was necessary to treat or prevent imminent or life-threatening deterioration of the following conditions:  Sepsis, shock, circulatory failure, metabolic crisis and dehydration   Critical care was time spent personally by me on the following activities:  Development of treatment plan with patient or surrogate, discussions with consultants, evaluation of patient's response to treatment, examination of patient, ordering and review of laboratory studies, ordering and review of radiographic studies, ordering and performing treatments and interventions, pulse oximetry, re-evaluation of patient's condition and review of old charts    Medications Ordered in the ED  lactated ringers  infusion ( Intravenous New Bag/Given 01/04/24 1925)  acetaminophen  (TYLENOL ) tablet 650 mg (has no administration in time range)  prochlorperazine  (COMPAZINE ) injection 5 mg (has no administration in time range)  melatonin tablet 5 mg (has no administration in time range)  polyethylene glycol (MIRALAX  / GLYCOLAX ) packet 17 g (has no administration in time range)  sodium chloride  0.9 % bolus 1,000 mL (0 mLs Intravenous Stopped 01/04/24 1927)  ceFEPIme  (MAXIPIME ) 2 g in sodium chloride  0.9 % 100 mL IVPB (0 g Intravenous Stopped 01/04/24 1829)  metroNIDAZOLE  (FLAGYL ) IVPB 500 mg (0 mg Intravenous Stopped 01/04/24 1927)  vancomycin  (VANCOREADY) IVPB 1750 mg/350 mL (0 mg Intravenous Stopped 01/04/24 2302)  iohexol  (OMNIPAQUE ) 350 MG/ML injection 75 mL (75 mLs Intravenous Contrast Given 01/04/24 7440)   78 year old chronically ill with multiple medical comorbidities here for  evaluation of syncope.  Was sitting in his wheelchair at home eating when he slumped over.  Then proceeded have an episode of NBNB emesis.  He denies any preceding events.  No seizure-like activity.  EMS arrival patient hypotensive systolic into the 80s received 500 IV fluids with improvement.  No postictal period.  Does have history of prior stroke with left-sided deficits unchanged according to patient.  On arrival he is afebrile however persistently hypotensive, tachycardic, tachypneic and hypoxic.  He was placed on 2 L via nasal cannula.  I suspect he likely aspirated when he syncopized.  He will be given broad-spectrum antibiotics, additional IV fluids.  Will plan on labs, imaging.  Will need admission.  Labs and imaging personally viewed and interpreted:  CBC leukocytosis 15.2, hemoglobin 12.0, platelets  42 Metabolic panel creatinine 1.62, similar to prior Lactic 2.1--1.6 Troponin 10-9 UA large leuks, greater than 50 WBC on cath urine D-dimer 0.77 INR 2.3 LDH 226 Viral panel negative EKG wo acute findings  Patient reassessed.  Pending CT imaging.  BP improved with IV fluids.  He continues to get IV antibiotics, broad-spectrum  Patient reassessed.  Continues to be normotensive.  Feels improved.  Discussed with family at bedside.  Will admit after CT imaging. CTA with multifocal pneumonia CT abdomen pelvis with aortic thrombus, constipation, abdominal hernia  Discussed results with patient, family at bedside.  Agreeable for admission.  Discussed with vascular surgery of aortic thrombus.  He will review imaging.  No change in medications at this time  Discussed with medicine team, Dr. Shona agreeable for admission  The patient appears reasonably stabilized for admission considering the current resources, flow, and capabilities available in the ED at this time, and I doubt any other [facility] requiring further screening and/or treatment in the ED prior to admission.   Clinical Course as of  01/04/24 2320  Sun Jan 04, 2024  2240 Discussed CT imaging of thrombus with Dr. Rexford with vascular surgery.  He will review imaging.  Given he is ready anticoagulated, low platelets nothing to do currently.  He will let us  know if there is anything after reviews imaging [BH]  2310 Dr. Shona with medicine to see for admission [BH]    Clinical Course User Index [BH] Mal Asher A, PA-C                                 Medical Decision Making Amount and/or Complexity of Data Reviewed Independent Historian: spouse and EMS External Data Reviewed: labs, radiology, ECG and notes. Labs: ordered. Decision-making details documented in ED Course. Radiology: ordered and independent interpretation performed. Decision-making details documented in ED Course. ECG/medicine tests: ordered and independent interpretation performed. Decision-making details documented in ED Course.  Risk OTC drugs. Prescription drug management. Parenteral controlled substances. Decision regarding hospitalization. Diagnosis or treatment significantly limited by social determinants of health.       Final diagnoses:  Sepsis with acute hypoxic respiratory failure and septic shock, due to unspecified organism (HCC)  Aspiration pneumonia of both lungs, unspecified aspiration pneumonia type, unspecified part of lung (HCC)  Syncope, unspecified syncope type  Chronic ITP (idiopathic thrombocytopenia) (HCC)  Aortic thrombus (HCC)  Constipation, unspecified constipation type    ED Discharge Orders     None          Erna Brossard A, PA-C 01/04/24 2321    Tegeler, Lonni PARAS, MD 01/04/24 [id number]

## 2024-01-05 ENCOUNTER — Other Ambulatory Visit (HOSPITAL_COMMUNITY)

## 2024-01-05 ENCOUNTER — Inpatient Hospital Stay (HOSPITAL_COMMUNITY)

## 2024-01-05 ENCOUNTER — Encounter: Payer: Self-pay | Admitting: Hematology & Oncology

## 2024-01-05 ENCOUNTER — Other Ambulatory Visit: Payer: Self-pay

## 2024-01-05 DIAGNOSIS — I7410 Embolism and thrombosis of unspecified parts of aorta: Secondary | ICD-10-CM

## 2024-01-05 DIAGNOSIS — J690 Pneumonitis due to inhalation of food and vomit: Secondary | ICD-10-CM

## 2024-01-05 DIAGNOSIS — R55 Syncope and collapse: Secondary | ICD-10-CM

## 2024-01-05 DIAGNOSIS — K5900 Constipation, unspecified: Secondary | ICD-10-CM

## 2024-01-05 DIAGNOSIS — D693 Immune thrombocytopenic purpura: Secondary | ICD-10-CM | POA: Diagnosis not present

## 2024-01-05 LAB — BASIC METABOLIC PANEL WITH GFR
Anion gap: 10 (ref 5–15)
BUN: 29 mg/dL — ABNORMAL HIGH (ref 8–23)
CO2: 17 mmol/L — ABNORMAL LOW (ref 22–32)
Calcium: 8.2 mg/dL — ABNORMAL LOW (ref 8.9–10.3)
Chloride: 109 mmol/L (ref 98–111)
Creatinine, Ser: 1.68 mg/dL — ABNORMAL HIGH (ref 0.61–1.24)
GFR, Estimated: 41 mL/min — ABNORMAL LOW (ref >60)
Glucose, Bld: 160 mg/dL — ABNORMAL HIGH (ref 70–99)
Potassium: 4.3 mmol/L (ref 3.5–5.1)
Sodium: 136 mmol/L (ref 135–145)

## 2024-01-05 LAB — CBC
HCT: 34 % — ABNORMAL LOW (ref 39.0–52.0)
Hemoglobin: 10.6 g/dL — ABNORMAL LOW (ref 13.0–17.0)
MCH: 26.2 pg (ref 26.0–34.0)
MCHC: 31.2 g/dL (ref 30.0–36.0)
MCV: 84 fL (ref 80.0–100.0)
Platelets: 43 K/uL — ABNORMAL LOW (ref 150–400)
RBC: 4.05 MIL/uL — ABNORMAL LOW (ref 4.22–5.81)
RDW: 19.5 % — ABNORMAL HIGH (ref 11.5–15.5)
WBC: 23.2 K/uL — ABNORMAL HIGH (ref 4.0–10.5)
nRBC: 0 % (ref 0.0–0.2)

## 2024-01-05 LAB — CBC WITH DIFFERENTIAL/PLATELET
Abs Immature Granulocytes: 0.07 10*3/uL (ref 0.00–0.07)
Basophils Absolute: 0 10*3/uL (ref 0.0–0.1)
Basophils Relative: 0 %
Eosinophils Absolute: 0 10*3/uL (ref 0.0–0.5)
Eosinophils Relative: 0 %
HCT: 29.5 % — ABNORMAL LOW (ref 39.0–52.0)
Hemoglobin: 9.4 g/dL — ABNORMAL LOW (ref 13.0–17.0)
Immature Granulocytes: 0 %
Lymphocytes Relative: 11 %
Lymphs Abs: 1.7 10*3/uL (ref 0.7–4.0)
MCH: 26 pg (ref 26.0–34.0)
MCHC: 31.9 g/dL (ref 30.0–36.0)
MCV: 81.7 fL (ref 80.0–100.0)
Monocytes Absolute: 1.5 10*3/uL — ABNORMAL HIGH (ref 0.1–1.0)
Monocytes Relative: 10 %
Neutro Abs: 12.8 10*3/uL — ABNORMAL HIGH (ref 1.7–7.7)
Neutrophils Relative %: 79 %
Platelets: 54 10*3/uL — ABNORMAL LOW (ref 150–400)
RBC: 3.61 MIL/uL — ABNORMAL LOW (ref 4.22–5.81)
RDW: 19.3 % — ABNORMAL HIGH (ref 11.5–15.5)
WBC: 16.1 10*3/uL — ABNORMAL HIGH (ref 4.0–10.5)
nRBC: 0 % (ref 0.0–0.2)

## 2024-01-05 LAB — ECHOCARDIOGRAM COMPLETE
AR max vel: 2.21 cm2
AV Peak grad: 6.5 mmHg
Ao pk vel: 1.27 m/s
Area-P 1/2: 3.03 cm2
Height: 70 in
S' Lateral: 1.7 cm
Weight: 2895.96 [oz_av]

## 2024-01-05 LAB — PROCALCITONIN: Procalcitonin: <0.1 ng/mL

## 2024-01-05 LAB — PHOSPHORUS: Phosphorus: 2.8 mg/dL (ref 2.5–4.6)

## 2024-01-05 LAB — URINE CULTURE: Culture: <10000 — AB

## 2024-01-05 LAB — MAGNESIUM: Magnesium: 1.9 mg/dL (ref 1.7–2.4)

## 2024-01-05 LAB — APTT: aPTT: 52 s — ABNORMAL HIGH (ref 24–36)

## 2024-01-05 MED ORDER — DEXAMETHASONE SODIUM PHOSPHATE 10 MG/ML IJ SOLN
10.0000 mg | Freq: Once | INTRAMUSCULAR | Status: AC
  Administered 2024-01-05: 10 mg via INTRAVENOUS
  Filled 2024-01-05: qty 1

## 2024-01-05 MED ORDER — BACLOFEN 10 MG PO TABS
10.0000 mg | ORAL_TABLET | Freq: Two times a day (BID) | ORAL | Status: DC | PRN
  Administered 2024-01-07: 10 mg via ORAL
  Filled 2024-01-05: qty 1

## 2024-01-05 MED ORDER — METRONIDAZOLE 500 MG/100ML IV SOLN
500.0000 mg | Freq: Two times a day (BID) | INTRAVENOUS | Status: DC
  Administered 2024-01-05 – 2024-01-07 (×6): 500 mg via INTRAVENOUS
  Filled 2024-01-05 (×7): qty 100

## 2024-01-05 MED ORDER — ARGATROBAN 50 MG/50ML IV SOLN
0.3600 ug/kg/min | INTRAVENOUS | Status: DC
  Administered 2024-01-05 – 2024-01-06 (×2): 0.25 ug/kg/min via INTRAVENOUS
  Filled 2024-01-05 (×3): qty 50

## 2024-01-05 MED ORDER — RIVAROXABAN 10 MG PO TABS: 20.0000 mg | ORAL_TABLET | Freq: Every day | ORAL | Status: DC

## 2024-01-05 MED ORDER — SERTRALINE HCL 100 MG PO TABS
200.0000 mg | ORAL_TABLET | Freq: Every morning | ORAL | Status: DC
  Administered 2024-01-06 – 2024-01-10 (×5): 200 mg via ORAL
  Filled 2024-01-05 (×5): qty 2

## 2024-01-05 MED ORDER — DEXAMETHASONE SODIUM PHOSPHATE 10 MG/ML IJ SOLN
40.0000 mg | INTRAMUSCULAR | Status: AC
  Administered 2024-01-06 – 2024-01-09 (×4): 40 mg via INTRAVENOUS
  Filled 2024-01-05 (×4): qty 4

## 2024-01-05 MED ORDER — MELATONIN 5 MG PO TABS
10.0000 mg | ORAL_TABLET | Freq: Once | ORAL | Status: AC
  Administered 2024-01-05: 10 mg via ORAL
  Filled 2024-01-05: qty 2

## 2024-01-05 MED ORDER — BUPROPION HCL ER (XL) 150 MG PO TB24
150.0000 mg | ORAL_TABLET | Freq: Every morning | ORAL | Status: DC
  Administered 2024-01-06 – 2024-01-10 (×5): 150 mg via ORAL
  Filled 2024-01-05 (×5): qty 1

## 2024-01-05 MED ORDER — HYDROCODONE-ACETAMINOPHEN 10-325 MG PO TABS
1.0000 | ORAL_TABLET | Freq: Two times a day (BID) | ORAL | Status: DC | PRN
  Administered 2024-01-05 – 2024-01-07 (×2): 1 via ORAL
  Filled 2024-01-05 (×2): qty 1

## 2024-01-05 MED ORDER — LACTATED RINGERS IV SOLN: INTRAVENOUS | Status: AC

## 2024-01-05 MED ORDER — DEXAMETHASONE SODIUM PHOSPHATE 10 MG/ML IJ SOLN: 10.0000 mg | INTRAMUSCULAR | Status: DC

## 2024-01-05 MED ORDER — POLYVINYL ALCOHOL 1.4 % OP SOLN
1.0000 [drp] | Freq: Two times a day (BID) | OPHTHALMIC | Status: DC | PRN
  Filled 2024-01-05: qty 15

## 2024-01-05 MED ORDER — SENNOSIDES-DOCUSATE SODIUM 8.6-50 MG PO TABS
2.0000 | ORAL_TABLET | Freq: Every day | ORAL | Status: DC
  Administered 2024-01-05 – 2024-01-09 (×6): 2 via ORAL
  Filled 2024-01-05 (×5): qty 2

## 2024-01-05 MED ORDER — ROMIPLOSTIM 250 MCG ~~LOC~~ SOLR
2.0000 ug/kg | Freq: Once | SUBCUTANEOUS | Status: AC
  Administered 2024-01-05: 165 ug via SUBCUTANEOUS
  Filled 2024-01-05: qty 0.33

## 2024-01-05 MED ORDER — MUSCLE RUB 10-15 % EX CREA: 1.0000 | TOPICAL_CREAM | CUTANEOUS | Status: DC | PRN

## 2024-01-05 MED ORDER — ROSUVASTATIN CALCIUM 20 MG PO TABS
40.0000 mg | ORAL_TABLET | Freq: Every day | ORAL | Status: DC
  Administered 2024-01-05 – 2024-01-09 (×5): 40 mg via ORAL
  Filled 2024-01-05 (×5): qty 2

## 2024-01-05 MED ORDER — SODIUM CHLORIDE 0.9 % IV SOLN
2.0000 g | Freq: Two times a day (BID) | INTRAVENOUS | Status: DC
  Administered 2024-01-05 – 2024-01-10 (×11): 2 g via INTRAVENOUS
  Filled 2024-01-05 (×11): qty 12.5

## 2024-01-05 MED ORDER — ASPIRIN 81 MG PO TBEC
81.0000 mg | DELAYED_RELEASE_TABLET | Freq: Every day | ORAL | Status: DC
Start: 2024-01-05 — End: 2024-01-05

## 2024-01-05 MED ORDER — LEVETIRACETAM 500 MG PO TABS
1500.0000 mg | ORAL_TABLET | Freq: Two times a day (BID) | ORAL | Status: DC
  Administered 2024-01-05 – 2024-01-10 (×12): 1500 mg via ORAL
  Filled 2024-01-05 (×4): qty 3
  Filled 2024-01-05: qty 6
  Filled 2024-01-05 (×7): qty 3

## 2024-01-05 MED ORDER — PANTOPRAZOLE SODIUM 40 MG PO TBEC
40.0000 mg | DELAYED_RELEASE_TABLET | Freq: Every day | ORAL | Status: DC
  Administered 2024-01-05 – 2024-01-10 (×6): 40 mg via ORAL
  Filled 2024-01-05 (×6): qty 1

## 2024-01-05 NOTE — Progress Notes (Signed)
 PHARMACY - ANTICOAGULATION CONSULT NOTE  Pharmacy Consult for argatroban  Indication: hypercoagulable state (lupus anticoagulant, h/o DVT, now with infrarenal aortic thrombus)  Allergies  Allergen Reactions   Benadryl  [Diphenhydramine ] Other (See Comments)    Affects kidneys   Pepcid  [Famotidine ] Other (See Comments)    Affects kidneys   Beef (Diagnostic) Other (See Comments)    Vegetarian   Chicken Meat (Diagnostic) Other (See Comments)    Vegetarian    Food Other (See Comments)    Animal meat product - pt is vegetarian   Pork (Diagnostic) Other (See Comments)    Vegetarian     Patient Measurements: Height: 5' 10 (177.8 cm) Weight: 82.1 kg (181 lb) IBW/kg (Calculated) : 73 HEPARIN  DW (KG): 82.1  Vital Signs: Temp: 98.1 F (36.7 C) (09/22 1646) Temp Source: Oral (09/22 1646) BP: 115/66 (09/22 1646) Pulse Rate: 77 (09/22 1646)  Labs: Recent Labs    01/04/24 1729 01/04/24 1750 01/04/24 2134 01/05/24 0625  HGB 12.0* 12.2*  --  10.6*  HCT 39.0 36.0*  --  34.0*  PLT 42*  --   --  43*  APTT 63*  --   --   --   LABPROT 26.5*  --   --   --   INR 2.3*  --   --   --   CREATININE 1.62* 1.70*  --  1.68*  TROPONINIHS 10  --  9  --     Estimated Creatinine Clearance: 37.4 mL/min (A) (by C-G formula based on SCr of 1.68 mg/dL (H)).   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 Patient takes two tablets (1000mg ) at bedtime every night. Patient also takes additional doses as needed and with his Norco 10-325.   01/04/2024 at 11:30 AM   alendronate (FOSAMAX) 70 MG tablet Take 70 mg by mouth once a week. Take one tablet (70mg ) by mouth once per week on Monday.   12/29/2023   amLODipine  (NORVASC ) 10 MG tablet Take 10 mg by mouth at bedtime.   01/03/2024 at 11:59 PM   aspirin  EC 81 MG tablet Take 81 mg by mouth at bedtime. Swallow whole.   01/03/2024   baclofen  (LIORESAL ) 10 MG tablet Take 10 mg by mouth in the morning and at bedtime.   01/03/2024 at 11:59 PM   buPROPion  (WELLBUTRIN  XL) 150 MG 24 hr tablet Take 150 mg by mouth in the morning.   01/04/2024 at 11:30 AM  Cholecalciferol  (VITAMIN D3) 50 MCG (2000 UT) TABS Take 2,000 Units by mouth at bedtime.   01/03/2024 at 11:59 PM   dexamethasone  (DECADRON ) 4 MG tablet TAKE 10 TABLETS BY MOUTH ONCE DAILY AS NEEDED FOR  3  DAYS  FOR  ITP 60 tablet 6 10/06/2023   HYDROcodone -acetaminophen  (NORCO) 10-325 MG tablet Take 1 tablet by mouth 2 (two) times daily as needed for moderate pain (pain score 4-6).   01/03/2024   levETIRAcetam  (KEPPRA ) 500 MG tablet Take 1,500 mg by mouth in the morning and at bedtime.   01/04/2024 at 11:30 AM   melatonin 5 MG TABS Take 1 tablet (5 mg total) by mouth at bedtime as needed. (Patient taking differently: Take 10 mg by mouth at bedtime.)   01/03/2024   omeprazole (PRILOSEC) 40 MG capsule Take 40 mg by mouth daily before breakfast.   01/04/2024 at 11:00 AM   polyethylene glycol (MIRALAX  / GLYCOLAX ) 17 g packet Take 17 g by mouth 2 (two) times daily. (Patient taking differently: Take 17 g by mouth daily as needed for mild constipation.) 14  each 0 Unknown   rivaroxaban  (XARELTO ) 20 MG TABS tablet Take 1 tablet (20 mg total) by mouth daily with supper. (Patient taking differently: Take 20 mg by mouth daily with breakfast.) 30 tablet 0 01/04/2024 at 11:30 AM   rosuvastatin  (CRESTOR ) 40 MG tablet Take 40 mg by mouth at bedtime.   01/03/2024   sertraline  (ZOLOFT ) 100 MG tablet Take 200 mg by mouth in the morning.    01/04/2024 at 11:30 AM   THERATEARS 0.25 % SOLN Place 1 drop into both eyes 2 (two) times daily as needed (for redness or irritation).   Unknown   trolamine salicylate (BLUE-EMU HEMP) 10 % cream Apply 1 Application topically as needed for muscle pain (Hand pain).   Past Month    Assessment: 78 y.o. M presents with syncopal episode. Pt on Xarelto  pta for h/o DVT (lupus anticoagulant). 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 Hem/onc (Dr. Timmy) recommends argatroban  for now.   Baseline aPTT 52 sec (at bottom of therapeutic range) prior to argatroban  start. Hgb 9.4, plt 54.   Goal of Therapy:  aPTT 50-90 sec Monitor platelets by anticoagulation protocol: Yes   Plan:  Argatroban  0.25 mcg/kg/min (starting low with baseline aPTT 52) aPTT in 4 hours Daily aPTT and CBC  Vito Ralph, PharmD, BCPS Please see amion for complete clinical pharmacist phone list 01/05/2024,6:44 PM

## 2024-01-05 NOTE — ED Notes (Signed)
 Notified provider Cherlyn, MD regarding family concern regarding dexamethasone  dosing.

## 2024-01-05 NOTE — ED Notes (Signed)
 Introduced self to patient at this time. Patient is resting in bed with visible chest rise and fall. The call light is in reach. There are no further requests at this time.  Pt boosted in bed and breakfast tray set up.

## 2024-01-05 NOTE — Progress Notes (Signed)
 Echocardiogram 2D Echocardiogram has been performed.  Mark [name] 01/05/2024, 5:10 PM

## 2024-01-05 NOTE — Evaluation (Signed)
 Occupational Therapy Evaluation Patient Details Name: Mark [name] MRN: [id number] DOB: 1945/06/20 Today's Date: 01/05/2024   History of Present Illness   78 y.o. male presents to [facility] hospital on 01/04/2024 after an episode of syncope. 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 PMH includes CVA, ITP, lupus, neurogenic bladder, seizure disorder.     Clinical Impressions Prior to this admission, patient living at home with his spouse, with daughter checking on them intermittently. Patient would walk short distances with a hemi walker, but could transfer to a wheelchair with minimal to no assist. His wife would assist with a gait belt for mobility and has been receiving HHOT and HHPT. Currently, patient is presenting with minimal generalized weakness, and need for min A for mobility and transfers. BP assessed in supine and sitting with no orthostatics noted. OT recommending return home with HHOT when stable, OT will continue to follow acutely.  BP supine: 122/72 (89) BP in sitting 0 minutes: 114/78 (91)     If plan is discharge home, recommend the following:   A little help with walking and/or transfers;A little help with bathing/dressing/bathroom;Help with stairs or ramp for entrance     Functional Status Assessment   Patient has had a recent decline in their functional status and demonstrates the ability to make significant improvements in function in a reasonable and predictable amount of time.     Equipment Recommendations   None recommended by OT     Recommendations for Other Services         Precautions/Restrictions   Precautions Precautions: Fall Recall of Precautions/Restrictions: Intact Precaution/Restrictions Comments: watch BP Restrictions Weight Bearing Restrictions Per Provider Order: No     Mobility Bed Mobility Overal bed mobility: Needs Assistance Bed Mobility: Supine to Sit, Sit to Supine     Supine to sit:  Contact guard Sit to supine: Mod assist   General bed mobility comments: Mod A to bring BLEs back onto stretcher    Transfers Overall transfer level: Needs assistance Equipment used: 1 person hand held assist Transfers: Sit to/from Stand Sit to Stand: Min assist           General transfer comment: Min A to come into standing, semi flexed posture, can correct with cues      Balance Overall balance assessment: Needs assistance Sitting-balance support: Bilateral upper extremity supported, Feet unsupported Sitting balance-Leahy Scale: Fair     Standing balance support: Single extremity supported, During functional activity, Reliant on assistive device for balance Standing balance-Leahy Scale: Fair Standing balance comment: reliant on OT                           ADL either performed or assessed with clinical judgement   ADL Overall ADL's : Needs assistance/impaired Eating/Feeding: Set up;Sitting   Grooming: Set up;Sitting   Upper Body Bathing: Contact guard assist;Sitting   Lower Body Bathing: Minimal assistance;Sit to/from stand;Sitting/lateral leans   Upper Body Dressing : Set up;Sitting   Lower Body Dressing: Minimal assistance;Sitting/lateral leans;Sit to/from stand   Toilet Transfer: Minimal assistance;Ambulation   Toileting- Clothing Manipulation and Hygiene: Contact guard assist;Sitting/lateral lean;Sit to/from stand       Functional mobility during ADLs: Minimal assistance General ADL Comments: Prior to this admission, patient living at home with his spouse, with daughter checking on them intermittently. Patient would walk short distances with a hemi walker, but could transfer to a wheelchair with minimal to no  assist. His wife would assist with a gait belt for mobility and has been receiving HHOT and HHPT. Currently, patient is presenting with minimal generalized weakness, and need for min A for mobility and transfers. BP assessed in supine and  sitting with no orthostatics noted. OT recommending return home with HHOT when stable, OT will continue to follow acutely.     Vision Baseline Vision/History: 1 Wears glasses Ability to See in Adequate Light: 0 Adequate Patient Visual Report: No change from baseline Vision Assessment?: Wears glasses for reading     Perception Perception: Not tested       Praxis Praxis: Not tested       Pertinent Vitals/Pain Pain Assessment Pain Assessment: No/denies pain     Extremity/Trunk Assessment Upper Extremity Assessment Upper Extremity Assessment: LUE deficits/detail;Generalized weakness LUE Deficits / Details: previous CVA with L hand in flexed contracted position and flexed at wrist, can achieve neutral at wrist, and can open fingers, patient completes exercises nightly to maintain and cleans his palm, minimally contracted at elbow, all compensatory movement coming from lats LUE Sensation: WNL LUE Coordination: decreased fine motor;decreased gross motor   Lower Extremity Assessment Lower Extremity Assessment: Defer to PT evaluation   Cervical / Trunk Assessment Cervical / Trunk Assessment: Kyphotic (minimally)   Communication Communication Communication: Impaired Factors Affecting Communication: Hearing impaired   Cognition Arousal: Alert Behavior During Therapy: WFL for tasks assessed/performed Cognition: No apparent impairments             OT - Cognition Comments: Alert and oriented, occasionally tangential, potential STM deficits                 Following commands: Intact       Cueing  General Comments   Cueing Techniques: Verbal cues  VSS on RA   Exercises     Shoulder Instructions      Home Living Family/patient expects to be discharged to:: Private residence Living Arrangements: Spouse/significant other Available Help at Discharge: Family;Available 24 hours/day Type of Home: House Home Access: Ramped entrance     Home Layout: One level      Bathroom Shower/Tub: Chief Strategy Officer: Handicapped height Bathroom Accessibility: Yes How Accessible: Accessible via walker Home Equipment: Shower seat;BSC/3in1;Cane - single point;Grab bars - toilet;Hand held shower head;Grab bars - tub/shower;Adaptive equipment;Wheelchair - Geophysical data processor;Other (comment) (Hemi walker) Adaptive Equipment: Long-handled shoe horn Additional Comments: pt reports being ambulatory for household distances using hemi walker. Pt has L PFRW, WC, shower chair/bench, bedside commode. Pt reports being independnet with cathing at home prior to admission.      Prior Functioning/Environment Prior Level of Function : Needs assist       Physical Assist : Mobility (physical);ADLs (physical) Mobility (physical): Transfers;Gait;Stairs ADLs (physical): Bathing;IADLs Mobility Comments: wife utilizes gait belt as needed, pt amb with hemi walker in the home,,DC from [facility] 07/21/23 ADLs Comments: wife assists with bathing, active wtih HHOT and PT    OT Problem List: Decreased strength;Decreased activity tolerance;Impaired balance (sitting and/or standing);Decreased safety awareness;Impaired UE functional use   OT Treatment/Interventions: Self-care/ADL training;Therapeutic exercise;Energy conservation;DME and/or AE instruction;Manual therapy;Therapeutic activities;Patient/family education;Balance training      OT Goals(Current goals can be found in the care plan section)   Acute Rehab OT Goals Patient Stated Goal: to get back home and into therapy OT Goal Formulation: With patient/family Time For Goal Achievement: 01/19/24 Potential to Achieve Goals: Good ADL Goals Pt Will Perform Lower Body Bathing: with set-up;sit to/from stand;sitting/lateral leans Pt  Will Perform Lower Body Dressing: with set-up;sitting/lateral leans;sit to/from stand Pt Will Transfer to Toilet: with set-up;ambulating;regular height toilet;grab bars Pt Will Perform  Toileting - Clothing Manipulation and hygiene: with set-up;sitting/lateral leans;sit to/from stand Additional ADL Goal #1: Patient will be able to complete functional task in standing for 3 minutes prior to needing seated rest break in order to increase overall activity tolerance.   OT Frequency:  Min 2X/week    Co-evaluation              AM-PAC OT 6 Clicks Daily Activity     Outcome Measure Help from another person eating meals?: A Little Help from another person taking care of personal grooming?: A Little Help from another person toileting, which includes using toliet, bedpan, or urinal?: A Little Help from another person bathing (including washing, rinsing, drying)?: A Little Help from another person to put on and taking off regular upper body clothing?: A Little Help from another person to put on and taking off regular lower body clothing?: A Little 6 Click Score: 18   End of Session Equipment Utilized During Treatment: Gait belt Nurse Communication: Mobility status  Activity Tolerance: Patient tolerated treatment well Patient left: in bed;with call bell/phone within reach;with family/visitor present  OT Visit Diagnosis: Unsteadiness on feet (R26.81);Other abnormalities of gait and mobility (R26.89);Muscle weakness (generalized) (M62.81);History of falling (Z91.81)                Time: 9167-9099 OT Time Calculation (min): 28 min Charges:  OT General Charges $OT Visit: 1 Visit OT Evaluation $OT Eval Moderate Complexity: 1 Mod OT Treatments $Self Care/Home Management : 8-22 mins  Ronal Gift E. Kalenna Millett, OTR/L Acute Rehabilitation Services [phone]   Ronal Gift Salt 01/05/2024, 10:33 AM

## 2024-01-05 NOTE — Consult Note (Signed)
 Mark [name] is well-known to me.  He is a very nice 78 year old white male.  He has both a hypercoagulable state and a coagulopathy.  He has a lupus anticoagulant.  He was just seen in the office on Friday.  He was doing well.  Unfortunately, we cannot get lab work on him.  He had some problems over the weekend.  He was admitted on 01/04/2024.  He came in because he was weak.  He apparently passed out.  He was like this for about 15 seconds.  He had some vomiting.  EMS was called.  They subsequently brought him to the ER.  In the ER, he had a CT angio of the chest which was negative for any pulmonary emboli.  He had a new thrombus in the distal infrarenal aorta.  He was evaluated by vascular surgery.  They recommended he continue his anticoagulation.  He had a UA which showed some bacteria.  Culture is pending.  He had evidence of pneumonia.  He may have had some aspiration.  He had an echocardiogram which is pending.  He had labs when he came in.  He was thrombocytopenic.  His platelet count is 42,000.  Hemoglobin was 12.  White cell count of 15.2.  His white cell count was 23.2 today.  Hemoglobin 10.6.  Platelet count 43,000.  His BUN and creatinine was 27 and 1.62 when he came in.  Calcium  8.6.  Today, his BUN is 29 creatinine 1.68.  Blood sugar is 160.  I think started on Decadron  at only 10 mg.  He has had no obvious bleeding.  Had a CT of the head which did not show anything that was new with this fact and cerebrovascular disease.  He has had no fever.  He has had no obvious cough.  He has had no abdominal pain.  He has had no obvious change in bowel or bladder habits.  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  His vital signs show temperature of 98.1.  Pulse 77.  Blood pressure 115/66.  His head neck exam shows no ocular or oral lesions.  There are no palpable cervical or supraclavicular lymph nodes.  Lungs are clear bilaterally.  He has good good air movement bilaterally.  Cardiac exam  regular rate and rhythm.  He has no murmurs.  Abdomen is soft..  He has decent bowel sounds.  There is no fluid wave.  There is no palpable liver or spleen tip.  Extremities shows the paralysis on the left side.  He really has maybe little bit of swelling in the left leg.  Right side is unremarkable.  Neurological exam shows no focal deficits outside of that which she has had long-term with the left-sided paralysis.    Mark [name] has a very complicated situation.  He has ITP but yet he has a hypercoagulable state.  Looks like he may have another thrombus.  This looks like an arterial thrombus.  For right now, I probably see about put him on argatroban .  I think this would be reasonable and I think this would be safe with respect to his platelets.  We really have to get his platelet count back up.  He needs high-dose Decadron .  He has had this before.  He also needs to have Nplate .  We typically also give IVIG.  Again we will have to see how his platelet count responds.  If he has an infection this would certainly cause his thrombocytopenia to exacerbate.  We will  have to see what the urinalysis shows.  I know this is quite complicated.  It is a fine line that we have to walk with respect to his coagulopathy and then his hypercoagulable state.    Jeralyn Crease, MD  [place] 1:5

## 2024-01-05 NOTE — Evaluation (Signed)
 Physical Therapy Evaluation Patient Details Name: Mark [name] MRN: [id number] DOB: 1945/07/20 Today's Date: 01/05/2024  History of Present Illness  78 y.o. male presents to [facility] hospital on 01/04/2024 after an episode of syncope. 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 PMH includes CVA, ITP, lupus, neurogenic bladder, seizure disorder.  Clinical Impression  Pt presents to PT with deficits in endurance but otherwise does not appear far from his recent baseline. Pt is able to transfer and ambulate for household distances with one person assistance and use of a hemiwalker. PT recommends continued HHPT at the time of discharge.        If plan is discharge home, recommend the following: A little help with walking and/or transfers;A little help with bathing/dressing/bathroom;Assistance with cooking/housework;Assist for transportation;Help with stairs or ramp for entrance   Can travel by private vehicle        Equipment Recommendations None recommended by PT  Recommendations for Other Services       Functional Status Assessment Patient has had a recent decline in their functional status and demonstrates the ability to make significant improvements in function in a reasonable and predictable amount of time.     Precautions / Restrictions Precautions Precautions: Fall Recall of Precautions/Restrictions: Intact Restrictions Weight Bearing Restrictions Per Provider Order: No      Mobility  Bed Mobility Overal bed mobility: Needs Assistance Bed Mobility: Supine to Sit, Sit to Supine     Supine to sit: Min assist, HOB elevated Sit to supine: Min assist        Transfers Overall transfer level: Needs assistance Equipment used: Hemi-walker Transfers: Sit to/from Stand Sit to Stand: Contact guard assist                Ambulation/Gait Ambulation/Gait assistance: Min assist Gait Distance (Feet): 75 Feet Assistive device:  Hemi-walker Gait Pattern/deviations: Step-to pattern Gait velocity: reduced Gait velocity interpretation: <1.31 ft/sec, indicative of household ambulator   General Gait Details: pt with slowed step-to gait, reduced stance time on LLE. Pt with increased R trunk lean onto hemiwalker, PT provding support initially to resist increased R lateral lean.  Stairs            Wheelchair Mobility     Tilt Bed    Modified Rankin (Stroke Patients Only)       Balance Overall balance assessment: Needs assistance Sitting-balance support: No upper extremity supported, Feet supported Sitting balance-Leahy Scale: Fair     Standing balance support: Single extremity supported, Reliant on assistive device for balance Standing balance-Leahy Scale: Poor                               Pertinent Vitals/Pain Pain Assessment Pain Assessment: No/denies pain    Home Living Family/patient expects to be discharged to:: Private residence Living Arrangements: Spouse/significant other Available Help at Discharge: Family;Available 24 hours/day Type of Home: House Home Access: Ramped entrance       Home Layout: One level Home Equipment: Shower seat;BSC/3in1;Cane - single point;Grab bars - toilet;Hand held shower head;Grab bars - tub/shower;Adaptive equipment;Wheelchair - Geophysical data processor;Other (comment) (hemiwalker) Additional Comments: pt reports being ambulatory for household distances using hemi walker. Pt has L PFRW, WC, shower chair/bench, bedside commode. Pt reports being independent with cathing at home prior to admission.    Prior Function Prior Level of Function : Needs assist  Mobility Comments: wife utilizes gait belt as needed, pt amb with hemi walker in the home,,DC from [facility] 07/21/23 ADLs Comments: wife assists with bathing, active wtih HHOT and PT     Extremity/Trunk Assessment   Upper Extremity Assessment Upper Extremity Assessment: Defer to OT  evaluation    Lower Extremity Assessment Lower Extremity Assessment: LLE deficits/detail LLE Deficits / Details: grossly 3/5    Cervical / Trunk Assessment Cervical / Trunk Assessment: Kyphotic  Communication   Communication Communication: Impaired Factors Affecting Communication: Hearing impaired    Cognition Arousal: Alert Behavior During Therapy: WFL for tasks assessed/performed   PT - Cognitive impairments: No apparent impairments                         Following commands: Intact       Cueing Cueing Techniques: Verbal cues     General Comments General comments (skin integrity, edema, etc.): VSS on RA, orthostatic vitals negative and documented in vitals flowsheet    Exercises     Assessment/Plan    PT Assessment Patient needs continued PT services  PT Problem List Decreased strength;Decreased activity tolerance;Decreased balance;Decreased mobility;Decreased knowledge of use of DME;Decreased safety awareness;Decreased knowledge of precautions       PT Treatment Interventions Gait training;DME instruction;Stair training;Functional mobility training;Therapeutic activities;Therapeutic exercise;Balance training;Neuromuscular re-education;Patient/family education    PT Goals (Current goals can be found in the Care Plan section)  Acute Rehab PT Goals Patient Stated Goal: to walk up and down the ramp at home without physical assistance PT Goal Formulation: With patient/family Time For Goal Achievement: 01/19/24 Potential to Achieve Goals: Fair    Frequency Min 2X/week     Co-evaluation               AM-PAC PT 6 Clicks Mobility  Outcome Measure Help needed turning from your back to your side while in a flat bed without using bedrails?: A Little Help needed moving from lying on your back to sitting on the side of a flat bed without using bedrails?: A Little Help needed moving to and from a bed to a chair (including a wheelchair)?: A  Little Help needed standing up from a chair using your arms (e.g., wheelchair or bedside chair)?: A Little Help needed to walk in hospital room?: A Little Help needed climbing 3-5 steps with a railing? : Total 6 Click Score: 16    End of Session Equipment Utilized During Treatment: Gait belt Activity Tolerance: Patient tolerated treatment well Patient left: in bed;with call bell/phone within reach Nurse Communication: Mobility status PT Visit Diagnosis: Other abnormalities of gait and mobility (R26.89);Muscle weakness (generalized) (M62.81)    Time: 9067-8998 PT Time Calculation (min) (ACUTE ONLY): 29 min   Charges:   PT Evaluation $PT Eval Low Complexity: 1 Low   PT General Charges $$ ACUTE PT VISIT: 1 Visit         Bernardino JINNY Ruth, PT, DPT Acute Rehabilitation Office [phone]   Bernardino JINNY Ruth 01/05/2024, 12:41 PM

## 2024-01-05 NOTE — Consult Note (Signed)
 Hospital Consult    Reason for Consult:  aorto iliac occlusive disease Referring Physician:  Dr. Cherlyn MRN #:  [id number]  History of Present Illness: This is a 78 y.o. male history of left-sided stroke with residual left leg weakness also has lupus anticoagulant with history of DVT currently anticoagulated with Xarelto .  He has no history of aneurysm disease or peripheral vascular disease.  He was a smoker until the age of 39 but quit many years ago.  Walking limited by history of stroke with left-sided weakness.  Denies any rest pain or tissue loss in his legs.  Along with Xarelto  he also takes aspirin  and Crestor  daily.  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    Lupus anticoagulant disorder (HCC)    Lupus anticoagulant syndrome (HCC)    Multiple closed anterior-posterior compression fractures of pelvis (HCC)    Seizures (HCC)    last seizure 10 years ago approx    Sleep apnea    cpap broken x 1 year per wife    Stroke (HCC) 02/27/2011   Left side weakness    Past Surgical History:  Procedure Laterality Date   blood clot Right    surgical removal   CHOLECYSTECTOMY     CORONARY ANGIOPLASTY  1992   CYSTOGRAM N/A 02/17/2020   Procedure: CYSTOGRAM;  Surgeon: Cam Morene ORN, MD;  Location: [facility];  Service: Urology;  Laterality: N/A;   CYSTOSCOPY WITH RETROGRADE PYELOGRAM, URETEROSCOPY AND STENT PLACEMENT Bilateral 02/17/2020   Procedure: REATHER SHIRLY CHARS  URETEROSCOPY AND STENT PLACEMENT;  Surgeon: Cam Morene ORN, MD;  Location: [facility];  Service: Urology;  Laterality: Bilateral;   SPLENECTOMY, TOTAL      vertebralplasty      Allergies  Allergen Reactions   Benadryl  [Diphenhydramine ] Other (See Comments)    Affects kidneys   Pepcid  [Famotidine ] Other (See Comments)    Affects kidneys   Beef (Diagnostic) Other (See Comments)    Vegetarian   Chicken Meat (Diagnostic) Other (See Comments)    Vegetarian    Food Other (See Comments)    Animal meat product - pt is vegetarian   Pork (Diagnostic) Other (See Comments)    Vegetarian     Prior to Admission medications   Medication Sig Start Date End Date Taking? Authorizing Provider  acetaminophen  (TYLENOL ) 500 MG tablet Take 1,000 mg by mouth every 6 (six) hours as needed for mild pain (pain score 1-3). Patient takes two tablets (1000mg ) at bedtime every night. Patient also takes additional doses as needed and with his Norco 10-325.   Yes [provider]  alendronate (FOSAMAX) 70 MG tablet Take 70 mg by mouth once a week. Take one tablet (70mg ) by mouth once per week on Monday. 10/24/23  Yes [provider]  amLODipine  (NORVASC ) 10 MG tablet Take 10 mg by mouth at bedtime. 12/09/23  Yes [provider]  aspirin  EC 81 MG tablet Take 81 mg by mouth at bedtime. Swallow whole.   Yes [provider]  baclofen  (LIORESAL ) 10 MG tablet Take 10 mg by  mouth in the morning and at bedtime.   Yes [provider]  buPROPion  (WELLBUTRIN  XL) 150 MG 24 hr tablet Take 150 mg by mouth in the morning. 12/19/14  Yes [provider]  Cholecalciferol  (VITAMIN D3) 50 MCG (2000 UT) TABS Take 2,000 Units by mouth at bedtime.   Yes [provider]  dexamethasone  (DECADRON ) 4 MG tablet TAKE 10 TABLETS BY MOUTH ONCE DAILY AS NEEDED FOR  3  DAYS  FOR  ITP 10/13/23  Yes Ennever, Maude SAUNDERS, MD  HYDROcodone -acetaminophen  (NORCO) 10-325 MG tablet Take 1 tablet by mouth 2 (two) times daily as needed for moderate pain (pain score 4-6). 11/20/23  Yes [provider]  levETIRAcetam  (KEPPRA ) 500 MG tablet Take 1,500 mg by  mouth in the morning and at bedtime. 12/24/11  Yes [provider]  melatonin 5 MG TABS Take 1 tablet (5 mg total) by mouth at bedtime as needed. Patient taking differently: Take 10 mg by mouth at bedtime. 07/21/23  Yes Love, [name], PA-C  omeprazole (PRILOSEC) 40 MG capsule Take 40 mg by mouth daily before breakfast. 08/01/19  Yes [provider]  polyethylene glycol (MIRALAX  / GLYCOLAX ) 17 g packet Take 17 g by mouth 2 (two) times daily. Patient taking differently: Take 17 g by mouth daily as needed for mild constipation. 08/12/23  Yes Love, [name], PA-C  rivaroxaban  (XARELTO ) 20 MG TABS tablet Take 1 tablet (20 mg total) by mouth daily with supper. Patient taking differently: Take 20 mg by mouth daily with breakfast. 07/21/23  Yes Love, [name], PA-C  rosuvastatin  (CRESTOR ) 40 MG tablet Take 40 mg by mouth at bedtime.   Yes [provider]  sertraline  (ZOLOFT ) 100 MG tablet Take 200 mg by mouth in the morning.    Yes [provider]  THERATEARS 0.25 % SOLN Place 1 drop into both eyes 2 (two) times daily as needed (for redness or irritation).   Yes [provider]  trolamine salicylate (BLUE-EMU HEMP) 10 % cream Apply 1 Application topically as needed for muscle pain (Hand pain).   Yes [provider]    Social History   Socioeconomic History   Marital status: Married    Spouse name: Not on file   Number of children: Not on file   Years of education: Not on file   Highest education level: Not on file  Occupational History   Not on file  Tobacco Use   Smoking status: Former    Current packs/day: 0.00    Average packs/day: 1 pack/day for 16.6 years (16.6 ttl pk-yrs)    Types: Cigarettes    Start date: 06/12/1968    Quit date: 01/27/1985    Years since quitting: 38.9   Smokeless tobacco: Never   Tobacco comments:    quit smoking 28 yeras ago  Vaping Use   Vaping status: Never Used  Substance and Sexual Activity   Alcohol  use: No     Alcohol /week: 0.0 standard drinks of alcohol    Drug use: Not Currently   Sexual activity: Not on file  Other Topics Concern   Not on file  Social History Narrative   Not on file   Social Drivers of Health   Financial Resource Strain: Not on file  Food Insecurity: No Food Insecurity (07/28/2023)   Hunger Vital Sign    Worried About Running Out of Food in the Last Year: Never true    Ran Out of Food in the Last Year: Never true  Transportation Needs: No Transportation Needs (07/28/2023)   PRAPARE - Administrator, Civil Service (Medical): No    Lack of Transportation (Non-Medical): No  Physical Activity: Not on file  Stress: Not on file  Social Connections: Socially Integrated (07/28/2023)   Social Connection and Isolation Panel    Frequency of Communication with Friends and Family: More than three times a week    Frequency of Social Gatherings with Friends and Family: Once a week    Attends Religious Services: More than 4 times per year    Active Member of [employer] or Organizations: Yes    Attends Banker Meetings: More than 4 times per year    Marital Status: Married  Catering manager Violence: Not At Risk (07/28/2023)   Humiliation, Afraid, Rape, and Kick questionnaire    Fear of Current or Ex-Partner: No    Emotionally Abused: No    Physically Abused: No    Sexually Abused: No     History reviewed. No pertinent family history.  Review of Systems  Constitutional: Negative.   HENT: Negative.    Eyes: Negative.   Respiratory: Negative.    Cardiovascular: Negative.   Gastrointestinal: Negative.   Musculoskeletal:  Positive for joint pain.  Skin: Negative.   Neurological:  Positive for sensory change, focal weakness and loss of consciousness.  Psychiatric/Behavioral: Negative.        Physical Examination  Vitals:   01/05/24 0426 01/05/24 0800  BP: 112/66 120/72  Pulse: 75 85  Resp: 20 19  Temp: 98.5 F (36.9 C) 97.8 F (36.6 C)  SpO2:  98% 97%   There is no height or weight on file to calculate BMI.  Physical Exam HENT:     Head: Normocephalic.     Nose: Nose normal.  Eyes:     Pupils: Pupils are equal, round, and reactive to light.  Cardiovascular:     Rate and Rhythm: Normal rate.     Pulses:          Femoral pulses are 2+ on the right side and 2+ on the left side.      Popliteal pulses are 3+ on the right side.       Dorsalis pedis pulses are 2+ on the left side.       Posterior tibial pulses are 2+ on the right side.  Pulmonary:     Effort: Pulmonary effort is normal.  Abdominal:     General: Abdomen is flat.     Palpations: Abdomen is soft. There is no mass.  Musculoskeletal:     Cervical back: Normal range of motion.     Right lower leg: No edema.     Left lower leg: No edema.     Comments: Changes consistent with C4 venous disease left greater than right lower extremity  Neurological:     General: No focal deficit present.     Mental Status: He is alert.  Psychiatric:        Mood and Affect: Mood normal.        Thought Content: Thought content normal.        Judgment: Judgment normal.      CBC    Component Value Date/Time   WBC 23.2 (H) 01/05/2024 0625   RBC 4.05 (L) 01/05/2024 0625   HGB 10.6 (L) 01/05/2024 0625   HGB 12.2 (L) 11/14/2023 1451   HGB 12.8 (L) 04/09/2017 1405   HGB 15.0 02/17/2009 1506   HCT 34.0 (L) 01/05/2024 9374  HCT 38.0 (L) 04/09/2017 1405   HCT 43.1 02/17/2009 1506   PLT 43 (L) 01/05/2024 0625   PLT 189 11/14/2023 1451   PLT 281 04/09/2017 1405   PLT 14 (L) 02/17/2009 1506   MCV 84.0 01/05/2024 0625   MCV 91 04/09/2017 1405   MCV 86.9 02/17/2009 1506   MCH 26.2 01/05/2024 0625   MCHC 31.2 01/05/2024 0625   RDW 19.5 (H) 01/05/2024 0625   RDW 15.6 04/09/2017 1405   RDW 13.8 02/17/2009 1506   LYMPHSABS 2.1 01/04/2024 1729   LYMPHSABS 4.0 (H) 04/09/2017 1405   LYMPHSABS 3.8 (H) 02/17/2009 1506   MONOABS 0.6 01/04/2024 1729   MONOABS 1.4 (H) 02/17/2009 1506    EOSABS 0.8 (H) 01/04/2024 1729   EOSABS 0.7 (H) 04/09/2017 1405   BASOSABS 0.1 01/04/2024 1729   BASOSABS 0.0 04/09/2017 1405   BASOSABS 0.1 02/17/2009 1506    BMET    Component Value Date/Time   NA 136 01/05/2024 0625   NA 146 (H) 04/09/2017 1405   NA 138 05/09/2016 1321   K 4.3 01/05/2024 0625   K 4.0 04/09/2017 1405   K 4.3 05/09/2016 1321   CL 109 01/05/2024 0625   CL 108 04/09/2017 1405   CO2 17 (L) 01/05/2024 0625   CO2 23 04/09/2017 1405   CO2 22 05/09/2016 1321   GLUCOSE 160 (H) 01/05/2024 0625   GLUCOSE 119 (H) 04/09/2017 1405   BUN 29 (H) 01/05/2024 0625   BUN 34 (H) 04/09/2017 1405   BUN 36.4 (H) 05/09/2016 1321   CREATININE 1.68 (H) 01/05/2024 0625   CREATININE 1.37 (H) 11/14/2023 1451   CREATININE 1.3 (H) 04/09/2017 1405   CREATININE 1.5 (H) 05/09/2016 1321   CALCIUM  8.2 (L) 01/05/2024 0625   CALCIUM  9.1 04/09/2017 1405   CALCIUM  9.9 05/09/2016 1321   GFRNONAA 41 (L) 01/05/2024 0625   GFRNONAA 53 (L) 11/14/2023 1451   GFRAA >60 06/02/2019 1502    COAGS: Lab Results  Component Value Date   INR 2.3 (H) 01/04/2024   INR 1.3 (H) 07/29/2023   INR 1.8 (H) 07/28/2023   PROTIME 14.4 (H) 04/09/2017   PROTIME 14.4 (H) 02/12/2017   PROTIME 16.8 (H) 12/05/2016     Non-Invasive Vascular Imaging:   CT ANGIOGRAPHY CHEST   CT ABDOMEN AND PELVIS WITH CONTRAST   TECHNIQUE: Multidetector CT imaging of the chest was performed using the standard protocol during bolus administration of intravenous contrast. Multiplanar CT image reconstructions and MIPs were obtained to evaluate the vascular anatomy. Multidetector CT imaging of the abdomen and pelvis was performed using the standard protocol during bolus administration of intravenous contrast.   RADIATION DOSE REDUCTION: This exam was performed according to the departmental dose-optimization program which includes automated exposure control, adjustment of the mA and/or kV according to patient size and/or use  of iterative reconstruction technique.   CONTRAST:  75mL OMNIPAQUE  IOHEXOL  350 MG/ML SOLN   COMPARISON:  Portable chest from today, portable chest 07/28/2023, and chest CTs without contrast 10/29/2019 and 05/09/2017.   Abdomen pelvis CT compared with CT abdomen and pelvis without and with contrast 02/03/2020 and 08/27/2007.   FINDINGS: CTA CHEST FINDINGS   Cardiovascular: There is diagnostic pulmonary arterial opacification. No arterial dilatation or embolus is seen. The pulmonary veins are normal.   The cardiac size is normal. There is no pericardial effusion. There are left main scattered single-vessel calcific plaques of the LAD coronary artery.   There is mild aortic tortuosity with scattered calcific plaques in  the aorta and great vessels. No aneurysm, stenosis or dissection.   Mediastinum/Nodes: Small hiatal hernia. There is a patulous esophagus, interval new mild to moderate distal esophageal wall thickening warranting consideration of endoscopic follow-up.   The trachea and main bronchi are clear. There are slightly prominent right hilar lymph nodes up to 1 cm short axis, borderline prominent subcarinal and precarinal lymph nodes no further adenopathy. Axillary regions are clear. Thyroid  gland unremarkable.   Lungs/Pleura: There is patchy hazy airspace disease in the anterior segment of the left upper lobe, small infiltrate in the infrahilar lingula with subsegmental bronchial impaction, and denser patchy consolidation in the posterior basal left lower lobe.   Findings consistent with multilobar pneumonia. A follow-up study is recommended after treatment to ensure clearing.   There is diffuse bronchial thickening, interval increased widespread mosaicism consistent with air trapping with small airways disease.   There are mild paraseptal emphysematous changes in both upper lobes, apical reticulated scar-like opacities are again noted.   9 mm stable solid right  middle on 7:70 is presumed benign due to length of stability.   There are scattered linear scar-like opacities both bases. No pleural effusions.   Musculoskeletal: Osteopenia. There is chronic wedging and kyphoplasty cement in the T11 and 12 vertebral bodies. Degenerative changes lumbar spine.   No acute or other significant osseous findings. Healed fractures are noted of some of the lateral left ribs. Unremarkable visualized chest wall.   Review of the MIP images confirms the above findings.   CT ABDOMEN and PELVIS FINDINGS   Hepatobiliary: Respiratory motion limits fine detail. No focal abnormality is seen through the motion artifact. The gallbladder again noted absent without biliary ductal dilatation.   Pancreas: Partially atrophic. No focal abnormality is seen through the motion artifact.   Spleen: Surgically absent.   Adrenals/Urinary Tract: Cortical thinning in renal volume loss right-greater-than-left, are again noted, some progression noted since 2021.   No mass enhancement of the kidneys is seen through the motion artifact. There is symmetric contrast uptake and excretion.   There are Bosniak 2 subcentimeter cortical cysts which are too small to characterize. Some of these are new since 2021. No follow-up imaging is required.   There is no urinary stone or obstruction. The bladder is diffusely thickened but also not fully distended. Correlate clinically for cystitis versus muscular hypertrophy.   Stomach/Bowel: Fluid filling in the stomach. No overt wall thickening. No small bowel dilatation.   Normal appendix. There is mild-to-moderate retained stool in the transverse colon.   There is a nonobstructed short loop of the mid descending colon extending into a lateral abdominal wall hernia sac. No hernia incarceration is suspected.   There is no wall thickening or dilatation of the colon, but there is a large amount of stool in the rectosigmoid segment.    There are no findings of acute stercoral proctitis but disimpaction is likely indicated.   Vascular/Lymphatic: Moderate aortoiliac atherosclerosis. 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 No adenopathy is seen.   Reproductive: Mild prostatomegaly. Probable phleboliths. Both testicles are in the scrotal sac. There are postsurgical changes of prior vasectomy.   Other: No free fluid, free hemorrhage or free air.   Musculoskeletal: Osteopenia. There are mild chronic central endplate compression fractures at L2 and 4.   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   No new osseous abnormality.  No acute skeletal findings.   Review of the MIP images confirms the above findings.   IMPRESSION: 1. No evidence of pulmonary arterial dilatation or embolus. 2. Aortic and coronary artery atherosclerosis. 3. Multilobar pneumonia left upper and lower lobes. Follow-up study recommended after treatment to ensure clearing. 4. Diffuse bronchial thickening with increased mosaicism consistent with air trapping and small airways disease. 5. Mildly prominent right hilar lymph nodes, probably reactive. 6. Patulous esophagus with interval new mild to moderate distal esophageal wall thickening. Consider endoscopic follow-up. 7. Constipation with large amount of stool in the rectosigmoid segment. No findings of stercoral proctitis but disimpaction is likely indicated. 8. Left lateral abdominal wall hernia containing a short loop of the mid descending colon without evidence of incarceration or obstruction. 9. Cystitis or bladder hypertrophy versus bladder nondistention. 10. Osteopenia and  degenerative change. Chronic compression fractures of T11 and T12 with kyphoplasty cement. 11. 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 12. Emphysema.   ASSESSMENT/PLAN: This is a 78 y.o. 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  He is anticoagulated and remains on aspirin  and statin.  I will have him follow-up in our office in 6 months with bilateral lower extremity popliteal duplexes to rule out aneurysm and ABIs.  Howell Groesbeck C. Sheree, MD [facility] Office: [phone] Pager: [phone]

## 2024-01-05 NOTE — Progress Notes (Signed)
 Attempted 2D Echo. Patient with care team in restroom. Will re-attempt echo at a later time.

## 2024-01-05 NOTE — Progress Notes (Signed)
 [facility]                                                                               Isley Zinni, is a 78 y.o. male, DOB - 1945-12-16, FMW:[id number] Admit date - 01/04/2024    Outpatient Primary MD for the patient is Teresa Channel, MD  LOS - 1  days    Brief summary   IHSAN NOMURA is a 78 y.o. male with medical history significant for CVA with residual left-sided deficits, ITP, lupus anticoagulant, neurogenic bladder, seizure disorder, who presents with an episode of syncope.  The patient was sitting down eating with his wife when suddenly he became unresponsive.    CT angio chest revealed no evidence of pulmonary embolism however showed multifocal infiltrates in the left upper and lower lobes.  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     UA positive for pyuria.  Infiltrates on imaging suggestive of pneumonia with possible aspiration.  Urine culture and peripheral blood cultures x 2 obtained.  The patient received empiric IV antibiotics, Cefepime , IV Flagyl , and IV vancomycin .   Assessment & Plan    Assessment and Plan:   Left lobar pneumonia Unclear etiology. Patient's wife reports that he vomiting after he gained consciousness.  Started him on broad spectrum IV antibiotics.  Continue the same.  Follow up blood cultures. Negative so far.  [place] oxygen to keep sats greater than 90%.     Syncope of unclear etiology Ct head showing  old right MCA infarct.  Get EEG to rule out seizures.  Monitor on telemetry.    Infrarenal aortic thrombus Restart Xarelto  as platelets have improved slightly/ remained stable compared to yesterday.  Continue with statin too.  He remains asymptomatic .  Vascular surgery on board and recommended outpatient follow up with bilateral lower  extremity popliteal duplexes to rule out aneurysm and ABI's.    ITP: Hematology on board.  Started him on decadron  10 mg IV daily for 3 days.  Monitor counts daily.    Hyperlipidemia Resume crestor .    H/o CVA with left sided deficits - therapy evaluations recommending HHPT.    Estimated body mass index is 25.97 kg/m as calculated from the following:   Height as of 01/02/24: 5' 10 (1.778 m).   Weight as of 01/02/24: 82.1 kg.  Code Status: DNR limited.  DVT Prophylaxis:  xarelto  on hold.    Level of Care: Level of care: Telemetry Medical Family Communication: Updated patient's family at bedside.   Disposition Plan:     Remains inpatient appropriate:  pending.   Procedures:  EEG.  echo  Consultants:   Vascular surgery  Hematology.   Antimicrobials:   Anti-infectives (From admission, onward)    Start     Dose/Rate Route Frequency Ordered Stop   01/05/24 1000  metroNIDAZOLE  (FLAGYL ) IVPB 500 mg        500 mg 100 mL/hr over 60 Minutes Intravenous Every 12 hours 01/05/24 0118     01/05/24 0800  ceFEPIme  (MAXIPIME ) 2  g in sodium chloride  0.9 % 100 mL IVPB        2 g 200 mL/hr over 30 Minutes Intravenous 2 times daily 01/05/24 0016     01/04/24 1745  ceFEPIme  (MAXIPIME ) 2 g in sodium chloride  0.9 % 100 mL IVPB        2 g 200 mL/hr over 30 Minutes Intravenous  Once 01/04/24 1733 01/04/24 1829   01/04/24 1745  metroNIDAZOLE  (FLAGYL ) IVPB 500 mg        500 mg 100 mL/hr over 60 Minutes Intravenous  Once 01/04/24 1733 01/04/24 1927   01/04/24 1745  vancomycin  (VANCOCIN ) IVPB 1000 mg/200 mL premix  Status:  Discontinued        1,000 mg 200 mL/hr over 60 Minutes Intravenous  Once 01/04/24 1733 01/04/24 1734   01/04/24 1745  vancomycin  (VANCOREADY) IVPB 1750 mg/350 mL        1,750 mg 175 mL/hr over 120 Minutes Intravenous  Once 01/04/24 1734 01/04/24 2302        Medications  Scheduled Meds:  aspirin  EC  81 mg Oral QHS   [START ON 01/06/2024] buPROPion   150 mg Oral  q AM   [START ON 01/06/2024] dexamethasone  (DECADRON ) injection  40 mg Intravenous Q24H   levETIRAcetam   1,500 mg Oral BID   pantoprazole   40 mg Oral Daily   romiPLOStim   2 mcg/kg Subcutaneous Once   rosuvastatin   40 mg Oral QHS   senna-docusate  2 tablet Oral QHS   [START ON 01/06/2024] sertraline   200 mg Oral q AM   Continuous Infusions:  ceFEPime  (MAXIPIME ) IV Stopped (01/05/24 1211)   metronidazole  Stopped (01/05/24 1409)   PRN Meds:.acetaminophen , artificial tears, baclofen , HYDROcodone -acetaminophen , melatonin, Muscle Rub, polyethylene glycol, prochlorperazine     Subjective:   Deloy Archey was seen and examined today.  No new complaints. No bleeding.   Objective:   Vitals:   01/05/24 0945 01/05/24 1122 01/05/24 1123 01/05/24 1140  BP: 127/63  123/78   Pulse:  80 80   Resp:  (!) 24 (!) 24   Temp:    98.1 F (36.7 C)  TempSrc:      SpO2:  97% 95%     Intake/Output Summary (Last 24 hours) at 01/05/2024 1516 Last data filed at 01/04/2024 1829 Gross per 24 hour  Intake 106.67 ml  Output --  Net 106.67 ml   There were no vitals filed for this visit.   Exam General exam: Appears calm and comfortable  Respiratory system: Clear to auscultation. Respiratory effort normal. Cardiovascular system: S1 & S2 heard, RRR. No JVD,  Gastrointestinal system: Abdomen is nondistended, soft and nontender.  Central nervous system: Alert and oriented. Extremities: Symmetric 5 x 5 power. Skin: No rashes,  Psychiatry: Mood & affect appropriate.     Data Reviewed:  I have personally reviewed following labs and imaging studies   CBC Lab Results  Component Value Date   WBC 23.2 (H) 01/05/2024   RBC 4.05 (L) 01/05/2024   HGB 10.6 (L) 01/05/2024   HCT 34.0 (L) 01/05/2024   MCV 84.0 01/05/2024   MCH 26.2 01/05/2024   PLT 43 (L) 01/05/2024   MCHC 31.2 01/05/2024   RDW 19.5 (H) 01/05/2024   LYMPHSABS 2.1 01/04/2024   MONOABS 0.6 01/04/2024   EOSABS 0.8 (H) 01/04/2024    BASOSABS 0.1 01/04/2024     Last metabolic panel Lab Results  Component Value Date   NA 136 01/05/2024   K 4.3 01/05/2024   CL 109 01/05/2024  CO2 17 (L) 01/05/2024   BUN 29 (H) 01/05/2024   CREATININE 1.68 (H) 01/05/2024   GLUCOSE 160 (H) 01/05/2024   GFRNONAA 41 (L) 01/05/2024   GFRAA >60 06/02/2019   CALCIUM  8.2 (L) 01/05/2024   PHOS 2.8 01/05/2024   PROT 6.8 01/04/2024   ALBUMIN 3.5 01/04/2024   LABGLOB 3.1 06/05/2016   AGRATIO 1.5 06/05/2016   BILITOT 0.7 01/04/2024   ALKPHOS 79 01/04/2024   AST 29 01/04/2024   ALT 28 01/04/2024   ANIONGAP 10 01/05/2024    CBG (last 3)  No results for input(s): GLUCAP in the last 72 hours.    Coagulation Profile: Recent Labs  Lab 01/04/24 1729  INR 2.3*     Radiology Studies: CT Angio Chest PE W and/or Wo Contrast Result Date: 01/04/2024 CLINICAL DATA:  Questionable sepsis, syncopal episode, previous history of pulmonary embolism and with hypotension. EXAM: CT ANGIOGRAPHY CHEST CT ABDOMEN AND PELVIS WITH CONTRAST TECHNIQUE: Multidetector CT imaging of the chest was performed using the standard protocol during bolus administration of intravenous contrast. Multiplanar CT image reconstructions and MIPs were obtained to evaluate the vascular anatomy. Multidetector CT imaging of the abdomen and pelvis was performed using the standard protocol during bolus administration of intravenous contrast. RADIATION DOSE REDUCTION: This exam was performed according to the departmental dose-optimization program which includes automated exposure control, adjustment of the mA and/or kV according to patient size and/or use of iterative reconstruction technique. CONTRAST:  75mL OMNIPAQUE  IOHEXOL  350 MG/ML SOLN COMPARISON:  Portable chest from today, portable chest 07/28/2023, and chest CTs without contrast 10/29/2019 and 05/09/2017. Abdomen pelvis CT compared with CT abdomen and pelvis without and with contrast 02/03/2020 and 08/27/2007. FINDINGS: CTA  CHEST FINDINGS Cardiovascular: There is diagnostic pulmonary arterial opacification. No arterial dilatation or embolus is seen. The pulmonary veins are normal. The cardiac size is normal. There is no pericardial effusion. There are left main scattered single-vessel calcific plaques of the LAD coronary artery. There is mild aortic tortuosity with scattered calcific plaques in the aorta and great vessels. No aneurysm, stenosis or dissection. Mediastinum/Nodes: Small hiatal hernia. There is a patulous esophagus, interval new mild to moderate distal esophageal wall thickening warranting consideration of endoscopic follow-up. The trachea and main bronchi are clear. There are slightly prominent right hilar lymph nodes up to 1 cm short axis, borderline prominent subcarinal and precarinal lymph nodes no further adenopathy. Axillary regions are clear. Thyroid  gland unremarkable. Lungs/Pleura: There is patchy hazy airspace disease in the anterior segment of the left upper lobe, small infiltrate in the infrahilar lingula with subsegmental bronchial impaction, and denser patchy consolidation in the posterior basal left lower lobe. Findings consistent with multilobar pneumonia. A follow-up study is recommended after treatment to ensure clearing. There is diffuse bronchial thickening, interval increased widespread mosaicism consistent with air trapping with small airways disease. There are mild paraseptal emphysematous changes in both upper lobes, apical reticulated scar-like opacities are again noted. 9 mm stable solid right middle on 7:70 is presumed benign due to length of stability. There are scattered linear scar-like opacities both bases. No pleural effusions. Musculoskeletal: Osteopenia. There is chronic wedging and kyphoplasty cement in the T11 and 12 vertebral bodies. Degenerative changes lumbar spine. No acute or other significant osseous findings. Healed fractures are noted of some of the lateral left ribs.  Unremarkable visualized chest wall. Review of the MIP images confirms the above findings. CT ABDOMEN and PELVIS FINDINGS Hepatobiliary: Respiratory motion limits fine detail. No focal abnormality is seen through the  motion artifact. The gallbladder again noted absent without biliary ductal dilatation. Pancreas: Partially atrophic. No focal abnormality is seen through the motion artifact. Spleen: Surgically absent. Adrenals/Urinary Tract: Cortical thinning in renal volume loss right-greater-than-left, are again noted, some progression noted since 2021. No mass enhancement of the kidneys is seen through the motion artifact. There is symmetric contrast uptake and excretion. There are Bosniak 2 subcentimeter cortical cysts which are too small to characterize. Some of these are new since 2021. No follow-up imaging is required. There is no urinary stone or obstruction. The bladder is diffusely thickened but also not fully distended. Correlate clinically for cystitis versus muscular hypertrophy. Stomach/Bowel: Fluid filling in the stomach. No overt wall thickening. No small bowel dilatation. Normal appendix. There is mild-to-moderate retained stool in the transverse colon. There is a nonobstructed short loop of the mid descending colon extending into a lateral abdominal wall hernia sac. No hernia incarceration is suspected. There is no wall thickening or dilatation of the colon, but there is a large amount of stool in the rectosigmoid segment. There are no findings of acute stercoral proctitis but disimpaction is likely indicated. Vascular/Lymphatic: Moderate aortoiliac atherosclerosis. 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 No adenopathy is seen. Reproductive: Mild prostatomegaly. Probable phleboliths. Both testicles are in the scrotal sac. There are postsurgical  changes of prior vasectomy. Other: No free fluid, free hemorrhage or free air. Musculoskeletal: Osteopenia. There are mild chronic central endplate compression fractures at L2 and 4. 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 No new osseous abnormality.  No acute skeletal findings. Review of the MIP images confirms the above findings. IMPRESSION: 1. No evidence of pulmonary arterial dilatation or embolus. 2. Aortic and coronary artery atherosclerosis. 3. Multilobar pneumonia left upper and lower lobes. Follow-up study recommended after treatment to ensure clearing. 4. Diffuse bronchial thickening with increased mosaicism consistent with air trapping and small airways disease. 5. Mildly prominent right hilar lymph nodes, probably reactive. 6. Patulous esophagus with interval new mild to moderate distal esophageal wall thickening. Consider endoscopic follow-up. 7. Constipation with large amount of stool in the rectosigmoid segment. No findings of stercoral proctitis but disimpaction is likely indicated. 8. Left lateral abdominal wall hernia containing a short loop of the mid descending colon without evidence of incarceration or obstruction. 9. Cystitis or bladder hypertrophy versus bladder nondistention. 10. Osteopenia and degenerative change. Chronic compression fractures of T11 and T12 with kyphoplasty cement. 11. 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 12. Emphysema. Aortic Atherosclerosis (ICD10-I70.0) and Emphysema (ICD10-J43.9). Electronically Signed   By: Francis Quam M.D.   On: 01/04/2024 22:22   CT ABDOMEN PELVIS W CONTRAST Result Date: 01/04/2024 CLINICAL DATA:   Questionable sepsis, syncopal episode, previous history of pulmonary embolism and with hypotension. EXAM: CT ANGIOGRAPHY CHEST CT ABDOMEN AND PELVIS WITH CONTRAST TECHNIQUE: Multidetector CT imaging of the chest was performed using the standard protocol during bolus administration of intravenous contrast. Multiplanar CT image reconstructions and MIPs were obtained to evaluate the vascular anatomy. Multidetector CT imaging of the abdomen and pelvis was performed using the standard protocol during  bolus administration of intravenous contrast. RADIATION DOSE REDUCTION: This exam was performed according to the departmental dose-optimization program which includes automated exposure control, adjustment of the mA and/or kV according to patient size and/or use of iterative reconstruction technique. CONTRAST:  75mL OMNIPAQUE  IOHEXOL  350 MG/ML SOLN COMPARISON:  Portable chest from today, portable chest 07/28/2023, and chest CTs without contrast 10/29/2019 and 05/09/2017. Abdomen pelvis CT compared with CT abdomen and pelvis without and with contrast 02/03/2020 and 08/27/2007. FINDINGS: CTA CHEST FINDINGS Cardiovascular: There is diagnostic pulmonary arterial opacification. No arterial dilatation or embolus is seen. The pulmonary veins are normal. The cardiac size is normal. There is no pericardial effusion. There are left main scattered single-vessel calcific plaques of the LAD coronary artery. There is mild aortic tortuosity with scattered calcific plaques in the aorta and great vessels. No aneurysm, stenosis or dissection. Mediastinum/Nodes: Small hiatal hernia. There is a patulous esophagus, interval new mild to moderate distal esophageal wall thickening warranting consideration of endoscopic follow-up. The trachea and main bronchi are clear. There are slightly prominent right hilar lymph nodes up to 1 cm short axis, borderline prominent subcarinal and precarinal lymph nodes no further adenopathy. Axillary regions are  clear. Thyroid  gland unremarkable. Lungs/Pleura: There is patchy hazy airspace disease in the anterior segment of the left upper lobe, small infiltrate in the infrahilar lingula with subsegmental bronchial impaction, and denser patchy consolidation in the posterior basal left lower lobe. Findings consistent with multilobar pneumonia. A follow-up study is recommended after treatment to ensure clearing. There is diffuse bronchial thickening, interval increased widespread mosaicism consistent with air trapping with small airways disease. There are mild paraseptal emphysematous changes in both upper lobes, apical reticulated scar-like opacities are again noted. 9 mm stable solid right middle on 7:70 is presumed benign due to length of stability. There are scattered linear scar-like opacities both bases. No pleural effusions. Musculoskeletal: Osteopenia. There is chronic wedging and kyphoplasty cement in the T11 and 12 vertebral bodies. Degenerative changes lumbar spine. No acute or other significant osseous findings. Healed fractures are noted of some of the lateral left ribs. Unremarkable visualized chest wall. Review of the MIP images confirms the above findings. CT ABDOMEN and PELVIS FINDINGS Hepatobiliary: Respiratory motion limits fine detail. No focal abnormality is seen through the motion artifact. The gallbladder again noted absent without biliary ductal dilatation. Pancreas: Partially atrophic. No focal abnormality is seen through the motion artifact. Spleen: Surgically absent. Adrenals/Urinary Tract: Cortical thinning in renal volume loss right-greater-than-left, are again noted, some progression noted since 2021. No mass enhancement of the kidneys is seen through the motion artifact. There is symmetric contrast uptake and excretion. There are Bosniak 2 subcentimeter cortical cysts which are too small to characterize. Some of these are new since 2021. No follow-up imaging is required. There is no urinary stone  or obstruction. The bladder is diffusely thickened but also not fully distended. Correlate clinically for cystitis versus muscular hypertrophy. Stomach/Bowel: Fluid filling in the stomach. No overt wall thickening. No small bowel dilatation. Normal appendix. There is mild-to-moderate retained stool in the transverse colon. There is a nonobstructed short loop of the mid descending colon extending into a lateral abdominal wall hernia sac. No hernia incarceration is suspected. There is no wall thickening or dilatation of the colon, but there is a large amount of stool in the rectosigmoid segment. There are no findings of acute stercoral proctitis but disimpaction is likely indicated. Vascular/Lymphatic: Moderate aortoiliac atherosclerosis. New thrombus in the distal infrarenal aorta causing about 70%  luminal stenosis extends into the common iliac arterial origins with moderate to high-grade thrombotic origin stenosis of the right. No adenopathy is seen. Reproductive: Mild prostatomegaly. Probable phleboliths. Both testicles are in the scrotal sac. There are postsurgical changes of prior vasectomy. Other: No free fluid, free hemorrhage or free air. Musculoskeletal: Osteopenia. There are mild chronic central endplate compression fractures at L2 and 4. 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 No new osseous abnormality.  No acute skeletal findings. Review of the MIP images confirms the above findings. IMPRESSION: 1. No evidence of pulmonary arterial dilatation or embolus. 2. Aortic and coronary artery atherosclerosis. 3. Multilobar pneumonia left upper and lower lobes. Follow-up study recommended after treatment to ensure clearing. 4. Diffuse bronchial thickening with increased mosaicism consistent with air trapping and small airways disease. 5. Mildly prominent  right hilar lymph nodes, probably reactive. 6. Patulous esophagus with interval new mild to moderate distal esophageal wall thickening. Consider endoscopic follow-up. 7. Constipation with large amount of stool in the rectosigmoid segment. No findings of stercoral proctitis but disimpaction is likely indicated. 8. Left lateral abdominal wall hernia containing a short loop of the mid descending colon without evidence of incarceration or obstruction. 9. Cystitis or bladder hypertrophy versus bladder nondistention. 10. Osteopenia and degenerative change. Chronic compression fractures of T11 and T12 with kyphoplasty cement. 11. 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 12. Emphysema. Aortic Atherosclerosis (ICD10-I70.0) and Emphysema (ICD10-J43.9). Electronically Signed   By: Francis Quam M.D.   On: 01/04/2024 22:22   CT Head Wo Contrast Result Date: 01/04/2024 EXAM: CT HEAD WITHOUT CONTRAST 01/04/2024 08:58:03 PM TECHNIQUE: CT of the head was performed without the administration of intravenous contrast. Automated exposure control, iterative reconstruction, and/or weight based adjustment of the mA/kV was utilized to reduce the radiation dose to as low as reasonably achievable. COMPARISON: 01/07/2020 CLINICAL HISTORY: Syncope/presyncope, cerebrovascular cause suspected. FINDINGS: BRAIN AND VENTRICLES: Old right MCA distribution infarct with encephalomalacic changes and ex vacuo dilatation of the right lateral ventricle. No acute hemorrhage. No evidence of acute infarct. No hydrocephalus. No extra-axial collection. No mass effect or midline shift. Mild age-related atrophy. Subcortical and periventricular small vessel ischemic changes. ORBITS: No acute abnormality. SINUSES: No acute abnormality. SOFT TISSUES AND SKULL: No acute soft tissue  abnormality. No skull fracture. Mild intracranial atherosclerosis. IMPRESSION: 1. No acute intracranial abnormality. 2. Old right MCA territory infarct. Small vessel ischemic changes. Electronically signed by: Pinkie Pebbles MD 01/04/2024 09:04 PM EDT RP Workstation: [id number]   DG Chest Port 1 View Result Date: 01/04/2024 CLINICAL DATA:  Questionable sepsis - evaluate for abnormality EXAM: PORTABLE CHEST 1 VIEW COMPARISON:  07/28/2023 FINDINGS: Heart and mediastinal contours are within normal limits. No focal opacities or effusions. No acute bony abnormality. No pneumothorax. IMPRESSION: No active disease. Electronically Signed   By: Franky Crease M.D.   On: 01/04/2024 18:14       Elgie Butter M.D. [facility] 01/05/2024, 3:16 PM  Available via Epic secure chat 7am-7pm After 7 pm, please refer to night coverage provider listed on amion.

## 2024-01-06 ENCOUNTER — Other Ambulatory Visit (HOSPITAL_COMMUNITY): Payer: Self-pay

## 2024-01-06 ENCOUNTER — Inpatient Hospital Stay (HOSPITAL_COMMUNITY)

## 2024-01-06 ENCOUNTER — Inpatient Hospital Stay: Payer: Self-pay

## 2024-01-06 ENCOUNTER — Encounter: Payer: Self-pay | Admitting: Hematology & Oncology

## 2024-01-06 DIAGNOSIS — I7410 Embolism and thrombosis of unspecified parts of aorta: Secondary | ICD-10-CM | POA: Diagnosis not present

## 2024-01-06 DIAGNOSIS — K5900 Constipation, unspecified: Secondary | ICD-10-CM | POA: Diagnosis not present

## 2024-01-06 DIAGNOSIS — R569 Unspecified convulsions: Secondary | ICD-10-CM

## 2024-01-06 DIAGNOSIS — D693 Immune thrombocytopenic purpura: Secondary | ICD-10-CM | POA: Diagnosis not present

## 2024-01-06 DIAGNOSIS — A419 Sepsis, unspecified organism: Secondary | ICD-10-CM | POA: Diagnosis not present

## 2024-01-06 DIAGNOSIS — R55 Syncope and collapse: Secondary | ICD-10-CM | POA: Diagnosis not present

## 2024-01-06 LAB — BASIC METABOLIC PANEL WITH GFR
Anion gap: 11 (ref 5–15)
BUN: 30 mg/dL — ABNORMAL HIGH (ref 8–23)
CO2: 19 mmol/L — ABNORMAL LOW (ref 22–32)
Calcium: 8.7 mg/dL — ABNORMAL LOW (ref 8.9–10.3)
Chloride: 111 mmol/L (ref 98–111)
Creatinine, Ser: 1.55 mg/dL — ABNORMAL HIGH (ref 0.61–1.24)
GFR, Estimated: 46 mL/min — ABNORMAL LOW (ref >60)
Glucose, Bld: 111 mg/dL — ABNORMAL HIGH (ref 70–99)
Potassium: 3.9 mmol/L (ref 3.5–5.1)
Sodium: 141 mmol/L (ref 135–145)

## 2024-01-06 LAB — CBC WITH DIFFERENTIAL/PLATELET
Abs Immature Granulocytes: 0.08 K/uL — ABNORMAL HIGH (ref 0.00–0.07)
Basophils Absolute: 0.1 K/uL (ref 0.0–0.1)
Basophils Relative: 0 %
Eosinophils Absolute: 0.1 K/uL (ref 0.0–0.5)
Eosinophils Relative: 0 %
HCT: 30.1 % — ABNORMAL LOW (ref 39.0–52.0)
Hemoglobin: 9.5 g/dL — ABNORMAL LOW (ref 13.0–17.0)
Immature Granulocytes: 1 %
Lymphocytes Relative: 17 %
Lymphs Abs: 2.9 K/uL (ref 0.7–4.0)
MCH: 25.7 pg — ABNORMAL LOW (ref 26.0–34.0)
MCHC: 31.6 g/dL (ref 30.0–36.0)
MCV: 81.6 fL (ref 80.0–100.0)
Monocytes Absolute: 2.6 K/uL — ABNORMAL HIGH (ref 0.1–1.0)
Monocytes Relative: 15 %
Neutro Abs: 12 K/uL — ABNORMAL HIGH (ref 1.7–7.7)
Neutrophils Relative %: 67 %
Platelets: 69 K/uL — ABNORMAL LOW (ref 150–400)
RBC: 3.69 MIL/uL — ABNORMAL LOW (ref 4.22–5.81)
RDW: 19.8 % — ABNORMAL HIGH (ref 11.5–15.5)
WBC: 17.7 K/uL — ABNORMAL HIGH (ref 4.0–10.5)
nRBC: 0 % (ref 0.0–0.2)

## 2024-01-06 LAB — APTT
aPTT: 79 s — ABNORMAL HIGH (ref 24–36)
aPTT: 68 s — ABNORMAL HIGH (ref 24–36)

## 2024-01-06 LAB — MRSA NEXT GEN BY PCR, NASAL: MRSA by PCR Next Gen: NOT DETECTED

## 2024-01-06 MED ORDER — CHLORHEXIDINE GLUCONATE CLOTH 2 % EX PADS
6.0000 | MEDICATED_PAD | Freq: Every day | CUTANEOUS | Status: DC
  Administered 2024-01-06 – 2024-01-10 (×5): 6 via TOPICAL

## 2024-01-06 MED ORDER — SODIUM CHLORIDE 0.9% FLUSH
10.0000 mL | INTRAVENOUS | Status: DC | PRN
  Administered 2024-01-07: 10 mL

## 2024-01-06 NOTE — Plan of Care (Signed)
  Problem: Education: Goal: Knowledge of General Education information will improve Description: Including pain rating scale, medication(s)/side effects and non-pharmacologic comfort measures 01/06/2024 0451 by Kennyth Suzen SAUNDERS, RN Outcome: Progressing 01/06/2024 0451 by Kennyth Suzen SAUNDERS, RN Outcome: Progressing   Problem: Health Behavior/Discharge Planning: Goal: Ability to manage health-related needs will improve 01/06/2024 0451 by Kennyth Suzen SAUNDERS, RN Outcome: Progressing 01/06/2024 0451 by Kennyth Suzen SAUNDERS, RN Outcome: Progressing   Problem: Clinical Measurements: Goal: Ability to maintain clinical measurements within normal limits will improve 01/06/2024 0451 by Kennyth Suzen SAUNDERS, RN Outcome: Progressing 01/06/2024 0451 by Kennyth Suzen SAUNDERS, RN Outcome: Progressing Goal: Will remain free from infection 01/06/2024 0451 by Kennyth Suzen SAUNDERS, RN Outcome: Progressing 01/06/2024 0451 by Kennyth Suzen SAUNDERS, RN Outcome: Progressing Goal: Diagnostic test results will improve 01/06/2024 0451 by Kennyth Suzen SAUNDERS, RN Outcome: Progressing 01/06/2024 0451 by Kennyth Suzen SAUNDERS, RN Outcome: Progressing Goal: Respiratory complications will improve 01/06/2024 0451 by Kennyth Suzen SAUNDERS, RN Outcome: Progressing 01/06/2024 0451 by Kennyth Suzen SAUNDERS, RN Outcome: Progressing Goal: Cardiovascular complication will be avoided 01/06/2024 0451 by Kennyth Suzen SAUNDERS, RN Outcome: Progressing 01/06/2024 0451 by Kennyth Suzen SAUNDERS, RN Outcome: Progressing   Problem: Activity: Goal: Risk for activity intolerance will decrease 01/06/2024 0451 by Kennyth Suzen SAUNDERS, RN Outcome: Progressing 01/06/2024 0451 by Kennyth Suzen SAUNDERS, RN Outcome: Progressing   Problem: Nutrition: Goal: Adequate nutrition will be maintained 01/06/2024 0451 by Kennyth Suzen SAUNDERS, RN Outcome: Progressing 01/06/2024 0451 by Kennyth Suzen SAUNDERS, RN Outcome: Progressing   Problem: Coping: Goal: Level of anxiety will  decrease 01/06/2024 0451 by Kennyth Suzen SAUNDERS, RN Outcome: Progressing 01/06/2024 0451 by Kennyth Suzen SAUNDERS, RN Outcome: Progressing   Problem: Elimination: Goal: Will not experience complications related to bowel motility 01/06/2024 0451 by Kennyth Suzen SAUNDERS, RN Outcome: Progressing 01/06/2024 0451 by Kennyth Suzen SAUNDERS, RN Outcome: Progressing Goal: Will not experience complications related to urinary retention 01/06/2024 0451 by Kennyth Suzen SAUNDERS, RN Outcome: Progressing 01/06/2024 0451 by Kennyth Suzen SAUNDERS, RN Outcome: Progressing   Problem: Pain Managment: Goal: General experience of comfort will improve and/or be controlled 01/06/2024 0451 by Kennyth Suzen SAUNDERS, RN Outcome: Progressing 01/06/2024 0451 by Kennyth Suzen SAUNDERS, RN Outcome: Progressing   Problem: Safety: Goal: Ability to remain free from injury will improve 01/06/2024 0451 by Kennyth Suzen SAUNDERS, RN Outcome: Progressing 01/06/2024 0451 by Kennyth Suzen SAUNDERS, RN Outcome: Progressing   Problem: Skin Integrity: Goal: Risk for impaired skin integrity will decrease 01/06/2024 0451 by Kennyth Suzen SAUNDERS, RN Outcome: Progressing 01/06/2024 0451 by Kennyth Suzen SAUNDERS, RN Outcome: Progressing

## 2024-01-06 NOTE — Procedures (Signed)
 Patient Name: Mark [name]  MRN: [id number]  Epilepsy Attending: Arlin MALVA Krebs  Referring Physician/Provider: Cherlyn Labella, MD  Date: 01/06/2024 Duration: 22.49 mins  Patient history: 78yo M with syncope. EEG to evaluate for seizure  Level of alertness: Awake  AEDs during EEG study: None  Technical aspects: This EEG study was done with scalp electrodes positioned according to the 10-20 International system of electrode placement. Electrical activity was reviewed with band pass filter of 1-70Hz , sensitivity of 7 uV/mm, display speed of 51mm/sec with a 60Hz  notched filter applied as appropriate. EEG data were recorded continuously and digitally stored.  Video monitoring was available and reviewed as appropriate.  Description: The posterior dominant rhythm consists of 9 Hz activity of moderate voltage (25-35 uV) seen predominantly in posterior head regions, symmetric and reactive to eye opening and eye closing. There is intermittent 3 to 6 Hz theta-delta slowing in right temporal region. Hyperventilation and photic stimulation were not performed.     ABNORMALITY - Intermittent slow, right temporal region  IMPRESSION: This study is suggestive of cortical dysfunction arising from right temporal region likely secondary to underlying structural abnormality. No seizures or epileptiform discharges were seen throughout the recording.  [name] O [name]

## 2024-01-06 NOTE — Progress Notes (Signed)
 PHARMACY - ANTICOAGULATION CONSULT NOTE  Pharmacy Consult for argatroban  Indication: hypercoagulable state (lupus anticoagulant, h/o DVT, now with infrarenal aortic thrombus)  Allergies  Allergen Reactions   Benadryl  [Diphenhydramine ] Other (See Comments)    Affects kidneys   Pepcid  [Famotidine ] Other (See Comments)    Affects kidneys   Beef (Diagnostic) Other (See Comments)    Vegetarian   Chicken Meat (Diagnostic) Other (See Comments)    Vegetarian    Food Other (See Comments)    Animal meat product - pt is vegetarian   Pork (Diagnostic) Other (See Comments)    Vegetarian     Patient Measurements: Height: 5' 10 (177.8 cm) Weight: 82.1 kg (181 lb) IBW/kg (Calculated) : 73 HEPARIN  DW (KG): 82.1  Vital Signs: Temp: 98.1 F (36.7 C) (09/23 0400) Temp Source: Oral (09/23 0400) BP: 129/85 (09/23 0400) Pulse Rate: 85 (09/23 0400)  Labs: Recent Labs    01/04/24 1729 01/04/24 1750 01/04/24 2134 01/05/24 0625 01/05/24 2008 01/06/24 0506  HGB 12.0* 12.2*  --  10.6* 9.4* 9.5*  HCT 39.0 36.0*  --  34.0* 29.5* 30.1*  PLT 42*  --   --  43* 54* 69*  APTT 63*  --   --   --  52* 79*  LABPROT 26.5*  --   --   --   --   --   INR 2.3*  --   --   --   --   --   CREATININE 1.62* 1.70*  --  1.68*  --  1.55*  TROPONINIHS 10  --  9  --   --   --     Estimated Creatinine Clearance: 40.6 mL/min (A) (by C-G formula based on SCr of 1.55 mg/dL (H)).   Assessment: 78 y.o. M presents with syncopal episode. Pt on Xarelto  pta for h/o DVT (lupus anticoagulant). 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 Hem/onc (Dr. Timmy) recommends argatroban  for now.   Baseline aPTT 52 sec (at bottom of therapeutic range) prior to argatroban  start. Hgb 9.4, plt 54.  aPTT is therapeutic at 79 sec on 0.25 mcg/kg/min. No bleeding noted, Hgb stable 9s, platelets up to 69.    Goal of Therapy:  aPTT 50-90  sec Monitor platelets by anticoagulation protocol: Yes   Plan:  Continue argatroban  at 0.25 mcg/kg/min  aPTT in 4 hours Daily aPTT and CBC Monitor for s/sx of bleeding  Thank you for involving pharmacy in this patient's care.  Delon Sax, PharmD, BCPS Clinical Pharmacist 01/06/2024 6:07 AM

## 2024-01-06 NOTE — Progress Notes (Signed)
 EEG complete - results pending

## 2024-01-06 NOTE — Progress Notes (Signed)
 [facility]                                                                               Franky Reier, is a 78 y.o. male, DOB - [birth year], FMW:[id number] Admit date - 01/04/2024    Outpatient Primary MD for the patient is Teresa Channel, MD  LOS - 2  days    Brief summary   TAARIQ LEITZ is a 78 y.o. male with medical history significant for CVA with residual left-sided deficits, ITP, lupus anticoagulant, neurogenic bladder, seizure disorder, who presents with an episode of syncope.  The patient was sitting down eating with his wife when suddenly he became unresponsive.    CT angio chest revealed no evidence of pulmonary embolism however showed multifocal infiltrates in the left upper and lower lobes.  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     UA positive for pyuria.  Infiltrates on imaging suggestive of pneumonia with possible aspiration.  Urine culture and peripheral blood cultures x 2 obtained.  The patient received empiric IV antibiotics, Cefepime , IV Flagyl , and IV vancomycin .    Assessment & Plan    Assessment and Plan:   Left lobar pneumonia Unclear etiology. Patient's wife reports that he vomiting after he gained consciousness.  Started him on broad spectrum IV antibiotics.  Continue the same.  Follow up blood cultures. Negative so far.  [place] oxygen to keep sats greater than 90%.  SLP evaluation to evaluate for aspiration.     Syncope of unclear etiology Ct head showing  old right MCA infarct.  EEG ordered and pending.  Monitor on telemetry.    Infrarenal aortic thrombus Patient was started on argatroban  for anti coagulation.  He remains asymptomatic .  Vascular surgery on board and recommended outpatient follow up with bilateral lower extremity popliteal duplexes  to rule out aneurysm and ABI's.  Meanwhile continue with statin.    ITP: Hematology on board.  Started him on decadron  10 mg IV daily for 3 days.  Monitor counts daily.    Hyperlipidemia Resume crestor .    H/o CVA with left sided deficits - therapy evaluations recommending HHPT.    Estimated body mass index is 25.97 kg/m as calculated from the following:   Height as of this encounter: 5' 10 (1.778 m).   Weight as of this encounter: 82.1 kg.  Code Status: DNR limited.  DVT Prophylaxis:  xarelto  on hold.    Level of Care: Level of care: Telemetry Medical Family Communication: Updated patient's family at bedside.   Disposition Plan:     Remains inpatient appropriate:  pending.   Procedures:  EEG.  echo  Consultants:   Vascular surgery  Hematology.   Antimicrobials:   Anti-infectives (From admission, onward)    Start     Dose/Rate Route Frequency Ordered Stop   01/05/24 1000  metroNIDAZOLE  (FLAGYL ) IVPB 500 mg        500 mg 100 mL/hr over 60 Minutes Intravenous Every 12 hours 01/05/24 0118     01/05/24 0800  ceFEPIme  (MAXIPIME ) 2 g in sodium chloride  0.9 % 100 mL IVPB        2 g 200 mL/hr over 30 Minutes Intravenous 2 times daily 01/05/24 0016     01/04/24 1745  ceFEPIme  (MAXIPIME ) 2 g in sodium chloride  0.9 % 100 mL IVPB        2 g 200 mL/hr over 30 Minutes Intravenous  Once 01/04/24 1733 01/04/24 1829   01/04/24 1745  metroNIDAZOLE  (FLAGYL ) IVPB 500 mg        500 mg 100 mL/hr over 60 Minutes Intravenous  Once 01/04/24 1733 01/04/24 1927   01/04/24 1745  vancomycin  (VANCOCIN ) IVPB 1000 mg/200 mL premix  Status:  Discontinued        1,000 mg 200 mL/hr over 60 Minutes Intravenous  Once 01/04/24 1733 01/04/24 1734   01/04/24 1745  vancomycin  (VANCOREADY) IVPB 1750 mg/350 mL        1,750 mg 175 mL/hr over 120 Minutes Intravenous  Once 01/04/24 1734 01/04/24 2302        Medications  Scheduled Meds:  buPROPion   150 mg Oral q AM   Chlorhexidine  Gluconate  Cloth  6 each Topical Daily   dexamethasone  (DECADRON ) injection  40 mg Intravenous Q24H   levETIRAcetam   1,500 mg Oral BID   pantoprazole   40 mg Oral Daily   rosuvastatin   40 mg Oral QHS   senna-docusate  2 tablet Oral QHS   sertraline   200 mg Oral q AM   Continuous Infusions:  argatroban  0.25 mcg/kg/min (01/05/24 2309)   ceFEPime  (MAXIPIME ) IV 2 g (01/06/24 1027)   lactated ringers  75 mL/hr at 01/06/24 1149   metronidazole  500 mg (01/06/24 1144)   PRN Meds:.acetaminophen , artificial tears, baclofen , HYDROcodone -acetaminophen , melatonin, Muscle Rub, polyethylene glycol, prochlorperazine , sodium chloride  flush    Subjective:   Duanne Duchesne was seen and examined today.  Comfortable, no new complaints.   Objective:   Vitals:   01/05/24 1646 01/05/24 2019 01/05/24 2337 01/06/24 0400  BP: 115/66 115/62 117/62 129/85  Pulse: 77 77 80 85  Resp:   18 16  Temp: 98.1 F (36.7 C) 98.9 F (37.2 C) 98.4 F (36.9 C) 98.1 F (36.7 C)  TempSrc: Oral Oral  Oral  SpO2:  91% 95% 96%  Weight: 82.1 kg     Height: 5' 10 (1.778 m)       Intake/Output Summary (Last 24 hours) at 01/06/2024 1434 Last data filed at 01/06/2024 1414 Gross per 24 hour  Intake 1798.87 ml  Output 2700 ml  Net -901.13 ml   Filed Weights   01/05/24 1646  Weight: 82.1 kg     Exam General exam: Appears calm and comfortable  Respiratory system: Clear to auscultation. Respiratory effort normal. Cardiovascular system: S1 & S2 heard, RRR.  Gastrointestinal system: Abdomen is nondistended, soft and nontender. Central nervous system: Alert and oriented. No focal neurological deficits. Extremities: Symmetric 5 x 5 power. Skin: No rashes,  Psychiatry: Mood & affect appropriate.      Data Reviewed:  I have personally reviewed following labs and imaging studies   CBC Lab Results  Component Value Date   WBC 17.7 (H) 01/06/2024   RBC 3.69 (L) 01/06/2024   HGB 9.5 (L) 01/06/2024   HCT 30.1 (L) 01/06/2024    MCV 81.6 01/06/2024   MCH 25.7 (L) 01/06/2024   PLT 69 (L) 01/06/2024   MCHC 31.6 01/06/2024   RDW 19.8 (H) 01/06/2024   LYMPHSABS 2.9 01/06/2024   MONOABS 2.6 (H) 01/06/2024  EOSABS 0.1 01/06/2024   BASOSABS 0.1 01/06/2024     Last metabolic panel Lab Results  Component Value Date   NA 141 01/06/2024   K 3.9 01/06/2024   CL 111 01/06/2024   CO2 19 (L) 01/06/2024   BUN 30 (H) 01/06/2024   CREATININE 1.55 (H) 01/06/2024   GLUCOSE 111 (H) 01/06/2024   GFRNONAA 46 (L) 01/06/2024   GFRAA >60 06/02/2019   CALCIUM  8.7 (L) 01/06/2024   PHOS 2.8 01/05/2024   PROT 6.8 01/04/2024   ALBUMIN 3.5 01/04/2024   LABGLOB 3.1 06/05/2016   AGRATIO 1.5 06/05/2016   BILITOT 0.7 01/04/2024   ALKPHOS 79 01/04/2024   AST 29 01/04/2024   ALT 28 01/04/2024   ANIONGAP 11 01/06/2024    CBG (last 3)  No results for input(s): GLUCAP in the last 72 hours.    Coagulation Profile: Recent Labs  Lab 01/04/24 1729  INR 2.3*     Radiology Studies: EEG adult Result Date: 01/06/2024 Shelton Arlin KIDD, MD     01/06/2024 11:47 AM Patient Name: DAYMIAN LILL MRN: [id number] Epilepsy Attending: Arlin KIDD Shelton Referring Physician/Provider: Cherlyn Labella, MD Date: 01/06/2024 Duration: 22.49 mins Patient history: 78yo M with syncope. EEG to evaluate for seizure Level of alertness: Awake AEDs during EEG study: None Technical aspects: This EEG study was done with scalp electrodes positioned according to the 10-20 International system of electrode placement. Electrical activity was reviewed with band pass filter of 1-70Hz , sensitivity of 7 uV/mm, display speed of 30mm/sec with a 60Hz  notched filter applied as appropriate. EEG data were recorded continuously and digitally stored.  Video monitoring was available and reviewed as appropriate. Description: The posterior dominant rhythm consists of 9 Hz activity of moderate voltage (25-35 uV) seen predominantly in posterior head regions, symmetric and reactive to  eye opening and eye closing. There is intermittent 3 to 6 Hz theta-delta slowing in right temporal region. Hyperventilation and photic stimulation were not performed.   ABNORMALITY - Intermittent slow, right temporal region IMPRESSION: This study is suggestive of cortical dysfunction arising from right temporal region likely secondary to underlying structural abnormality. No seizures or epileptiform discharges were seen throughout the recording. Priyanka O Yadav   US  EKG SITE RITE Result Date: 01/06/2024 If [facility] image not attached, placement could not be confirmed due to current cardiac rhythm.  ECHOCARDIOGRAM COMPLETE Result Date: 01/05/2024    ECHOCARDIOGRAM REPORT   Patient Name:   REMON QUINTO Date of Exam: 01/05/2024 Medical Rec #:  [id number]       Height:       70.0 in Accession #:    [id number]      Weight:       181.0 lb Date of Birth:  08/08/1945       BSA:          2.001 m Patient Age:    78 years        BP:           123/78 mmHg Patient Gender: M               HR:           80 bpm. Exam Location:  Inpatient Procedure: 2D Echo, Cardiac Doppler and Color Doppler (Both Spectral and Color            Flow Doppler were utilized during procedure). Indications:    Syncope R55  History:        Patient has prior history of Echocardiogram  examinations, most                 recent 10/22/2016. Stroke and CKD, stage 3,                 Signs/Symptoms:Hypotension; Risk Factors:Sleep Apnea.  Sonographer:    Thea Norlander RCS Referring Phys: CAROLE N HALL IMPRESSIONS  1. Left ventricular ejection fraction, by estimation, is 55 to 60%. The left ventricle has normal function. The left ventricle has no regional wall motion abnormalities. Left ventricular diastolic parameters are consistent with Grade I diastolic dysfunction (impaired relaxation).  2. Right ventricular systolic function is normal. The right ventricular size is normal. Tricuspid regurgitation signal is inadequate for assessing PA pressure.  3.  The mitral valve is normal in structure. No evidence of mitral valve regurgitation. No evidence of mitral stenosis.  4. The aortic valve is tricuspid. Aortic valve regurgitation is not visualized. No aortic stenosis is present.  5. The inferior vena cava is normal in size with greater than 50% respiratory variability, suggesting right atrial pressure of 3 mmHg. FINDINGS  Left Ventricle: Left ventricular ejection fraction, by estimation, is 55 to 60%. The left ventricle has normal function. The left ventricle has no regional wall motion abnormalities. The left ventricular internal cavity size was normal in size. There is  no left ventricular hypertrophy. Left ventricular diastolic parameters are consistent with Grade I diastolic dysfunction (impaired relaxation). Right Ventricle: The right ventricular size is normal. No increase in right ventricular wall thickness. Right ventricular systolic function is normal. Tricuspid regurgitation signal is inadequate for assessing PA pressure. Left Atrium: Left atrial size was normal in size. Right Atrium: Right atrial size was normal in size. Pericardium: Trivial pericardial effusion is present. Mitral Valve: The mitral valve is normal in structure. No evidence of mitral valve regurgitation. No evidence of mitral valve stenosis. Tricuspid Valve: The tricuspid valve is normal in structure. Tricuspid valve regurgitation is not demonstrated. Aortic Valve: The aortic valve is tricuspid. Aortic valve regurgitation is not visualized. No aortic stenosis is present. Aortic valve peak gradient measures 6.5 mmHg. Pulmonic Valve: The pulmonic valve was normal in structure. Pulmonic valve regurgitation is not visualized. Aorta: The aortic root is normal in size and structure. Venous: The inferior vena cava is normal in size with greater than 50% respiratory variability, suggesting right atrial pressure of 3 mmHg. IAS/Shunts: No atrial level shunt detected by color flow Doppler.  LEFT  VENTRICLE PLAX 2D LVIDd:         2.50 cm   Diastology LVIDs:         1.70 cm   LV e' medial:    9.36 cm/s LV PW:         1.10 cm   LV E/e' medial:  6.8 LV IVS:        0.90 cm   LV e' lateral:   10.80 cm/s LVOT diam:     2.10 cm   LV E/e' lateral: 5.9 LV SV:         49 LV SV Index:   25 LVOT Area:     3.46 cm  RIGHT VENTRICLE             IVC RV S prime:     13.20 cm/s  IVC diam: 1.90 cm TAPSE (M-mode): 2.2 cm LEFT ATRIUM             Index        RIGHT ATRIUM  Index LA diam:        3.70 cm 1.85 cm/m   RA Area:     13.10 cm LA Vol (A2C):   49.6 ml 24.79 ml/m  RA Volume:   32.90 ml  16.44 ml/m LA Vol (A4C):   37.1 ml 18.54 ml/m LA Biplane Vol: 47.1 ml 23.54 ml/m  AORTIC VALVE AV Area (Vmax): 2.21 cm AV Vmax:        127.00 cm/s AV Peak Grad:   6.5 mmHg LVOT Vmax:      81.20 cm/s LVOT Vmean:     51.400 cm/s LVOT VTI:       0.142 m  AORTA Ao Root diam: 3.50 cm Ao Asc diam:  3.40 cm MITRAL VALVE MV Area (PHT): 3.03 cm    SHUNTS MV Decel Time: 250 msec    Systemic VTI:  0.14 m MV E velocity: 63.40 cm/s  Systemic Diam: 2.10 cm MV A velocity: 91.70 cm/s MV E/A ratio:  0.69 Dalton McleanMD Electronically signed by Ezra Kanner Signature Date/Time: 01/05/2024/10:38:03 PM    Final    CT Angio Chest PE W and/or Wo Contrast Result Date: 01/04/2024 CLINICAL DATA:  Questionable sepsis, syncopal episode, previous history of pulmonary embolism and with hypotension. EXAM: CT ANGIOGRAPHY CHEST CT ABDOMEN AND PELVIS WITH CONTRAST TECHNIQUE: Multidetector CT imaging of the chest was performed using the standard protocol during bolus administration of intravenous contrast. Multiplanar CT image reconstructions and MIPs were obtained to evaluate the vascular anatomy. Multidetector CT imaging of the abdomen and pelvis was performed using the standard protocol during bolus administration of intravenous contrast. RADIATION DOSE REDUCTION: This exam was performed according to the departmental dose-optimization program which  includes automated exposure control, adjustment of the mA and/or kV according to patient size and/or use of iterative reconstruction technique. CONTRAST:  75mL OMNIPAQUE  IOHEXOL  350 MG/ML SOLN COMPARISON:  Portable chest from today, portable chest 07/28/2023, and chest CTs without contrast 10/29/2019 and 05/09/2017. Abdomen pelvis CT compared with CT abdomen and pelvis without and with contrast 02/03/2020 and 08/27/2007. FINDINGS: CTA CHEST FINDINGS Cardiovascular: There is diagnostic pulmonary arterial opacification. No arterial dilatation or embolus is seen. The pulmonary veins are normal. The cardiac size is normal. There is no pericardial effusion. There are left main scattered single-vessel calcific plaques of the LAD coronary artery. There is mild aortic tortuosity with scattered calcific plaques in the aorta and great vessels. No aneurysm, stenosis or dissection. Mediastinum/Nodes: Small hiatal hernia. There is a patulous esophagus, interval new mild to moderate distal esophageal wall thickening warranting consideration of endoscopic follow-up. The trachea and main bronchi are clear. There are slightly prominent right hilar lymph nodes up to 1 cm short axis, borderline prominent subcarinal and precarinal lymph nodes no further adenopathy. Axillary regions are clear. Thyroid  gland unremarkable. Lungs/Pleura: There is patchy hazy airspace disease in the anterior segment of the left upper lobe, small infiltrate in the infrahilar lingula with subsegmental bronchial impaction, and denser patchy consolidation in the posterior basal left lower lobe. Findings consistent with multilobar pneumonia. A follow-up study is recommended after treatment to ensure clearing. There is diffuse bronchial thickening, interval increased widespread mosaicism consistent with air trapping with small airways disease. There are mild paraseptal emphysematous changes in both upper lobes, apical reticulated scar-like opacities are again  noted. 9 mm stable solid right middle on 7:70 is presumed benign due to length of stability. There are scattered linear scar-like opacities both bases. No pleural effusions. Musculoskeletal: Osteopenia. There is chronic wedging and kyphoplasty  cement in the T11 and 12 vertebral bodies. Degenerative changes lumbar spine. No acute or other significant osseous findings. Healed fractures are noted of some of the lateral left ribs. Unremarkable visualized chest wall. Review of the MIP images confirms the above findings. CT ABDOMEN and PELVIS FINDINGS Hepatobiliary: Respiratory motion limits fine detail. No focal abnormality is seen through the motion artifact. The gallbladder again noted absent without biliary ductal dilatation. Pancreas: Partially atrophic. No focal abnormality is seen through the motion artifact. Spleen: Surgically absent. Adrenals/Urinary Tract: Cortical thinning in renal volume loss right-greater-than-left, are again noted, some progression noted since 2021. No mass enhancement of the kidneys is seen through the motion artifact. There is symmetric contrast uptake and excretion. There are Bosniak 2 subcentimeter cortical cysts which are too small to characterize. Some of these are new since 2021. No follow-up imaging is required. There is no urinary stone or obstruction. The bladder is diffusely thickened but also not fully distended. Correlate clinically for cystitis versus muscular hypertrophy. Stomach/Bowel: Fluid filling in the stomach. No overt wall thickening. No small bowel dilatation. Normal appendix. There is mild-to-moderate retained stool in the transverse colon. There is a nonobstructed short loop of the mid descending colon extending into a lateral abdominal wall hernia sac. No hernia incarceration is suspected. There is no wall thickening or dilatation of the colon, but there is a large amount of stool in the rectosigmoid segment. There are no findings of acute stercoral proctitis but  disimpaction is likely indicated. Vascular/Lymphatic: Moderate aortoiliac atherosclerosis. 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 No adenopathy is seen. Reproductive: Mild prostatomegaly. Probable phleboliths. Both testicles are in the scrotal sac. There are postsurgical changes of prior vasectomy. Other: No free fluid, free hemorrhage or free air. Musculoskeletal: Osteopenia. There are mild chronic central endplate compression fractures at L2 and 4. 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 No new osseous abnormality.  No acute skeletal findings. Review of the MIP images confirms the above findings. IMPRESSION: 1. No evidence of pulmonary arterial dilatation or embolus. 2. Aortic and coronary artery atherosclerosis. 3. Multilobar pneumonia left upper and lower lobes. Follow-up study recommended after treatment to ensure clearing. 4. Diffuse bronchial thickening with increased mosaicism consistent with air trapping and small airways disease. 5. Mildly prominent right hilar lymph nodes, probably reactive. 6. Patulous esophagus with interval new mild to moderate distal esophageal wall thickening. Consider endoscopic follow-up. 7. Constipation with large amount of stool in the rectosigmoid segment. No findings of stercoral proctitis but disimpaction is likely indicated. 8. Left lateral abdominal wall hernia containing a short loop of the mid descending colon without evidence of incarceration or obstruction. 9. Cystitis or bladder hypertrophy versus bladder nondistention. 10. Osteopenia and degenerative change. Chronic compression fractures of T11 and T12 with kyphoplasty cement. 11. New thrombus in the  distal infrarenal aorta causing about 70% luminal stenosis and extending into the common iliac arterial origins with moderate to high-grade thrombotic origin stenosis of the right common iliac artery. 12. Emphysema. Aortic Atherosclerosis (ICD10-I70.0) and Emphysema (ICD10-J43.9). Electronically Signed   By: Francis Quam M.D.   On: 01/04/2024 22:22   CT ABDOMEN PELVIS W CONTRAST Result Date: 01/04/2024 CLINICAL DATA:  Questionable sepsis, syncopal episode, previous history of pulmonary  embolism and with hypotension. EXAM: CT ANGIOGRAPHY CHEST CT ABDOMEN AND PELVIS WITH CONTRAST TECHNIQUE: Multidetector CT imaging of the chest was performed using the standard protocol during bolus administration of intravenous contrast. Multiplanar CT image reconstructions and MIPs were obtained to evaluate the vascular anatomy. Multidetector CT imaging of the abdomen and pelvis was performed using the standard protocol during bolus administration of intravenous contrast. RADIATION DOSE REDUCTION: This exam was performed according to the departmental dose-optimization program which includes automated exposure control, adjustment of the mA and/or kV according to patient size and/or use of iterative reconstruction technique. CONTRAST:  75mL OMNIPAQUE  IOHEXOL  350 MG/ML SOLN COMPARISON:  Portable chest from today, portable chest 07/28/2023, and chest CTs without contrast 10/29/2019 and 05/09/2017. Abdomen pelvis CT compared with CT abdomen and pelvis without and with contrast 02/03/2020 and 08/27/2007. FINDINGS: CTA CHEST FINDINGS Cardiovascular: There is diagnostic pulmonary arterial opacification. No arterial dilatation or embolus is seen. The pulmonary veins are normal. The cardiac size is normal. There is no pericardial effusion. There are left main scattered single-vessel calcific plaques of the LAD coronary artery. There is mild aortic tortuosity with scattered calcific plaques in the aorta and great vessels. No aneurysm,  stenosis or dissection. Mediastinum/Nodes: Small hiatal hernia. There is a patulous esophagus, interval new mild to moderate distal esophageal wall thickening warranting consideration of endoscopic follow-up. The trachea and main bronchi are clear. There are slightly prominent right hilar lymph nodes up to 1 cm short axis, borderline prominent subcarinal and precarinal lymph nodes no further adenopathy. Axillary regions are clear. Thyroid  gland unremarkable. Lungs/Pleura: There is patchy hazy airspace disease in the anterior segment of the left upper lobe, small infiltrate in the infrahilar lingula with subsegmental bronchial impaction, and denser patchy consolidation in the posterior basal left lower lobe. Findings consistent with multilobar pneumonia. A follow-up study is recommended after treatment to ensure clearing. There is diffuse bronchial thickening, interval increased widespread mosaicism consistent with air trapping with small airways disease. There are mild paraseptal emphysematous changes in both upper lobes, apical reticulated scar-like opacities are again noted. 9 mm stable solid right middle on 7:70 is presumed benign due to length of stability. There are scattered linear scar-like opacities both bases. No pleural effusions. Musculoskeletal: Osteopenia. There is chronic wedging and kyphoplasty cement in the T11 and 12 vertebral bodies. Degenerative changes lumbar spine. No acute or other significant osseous findings. Healed fractures are noted of some of the lateral left ribs. Unremarkable visualized chest wall. Review of the MIP images confirms the above findings. CT ABDOMEN and PELVIS FINDINGS Hepatobiliary: Respiratory motion limits fine detail. No focal abnormality is seen through the motion artifact. The gallbladder again noted absent without biliary ductal dilatation. Pancreas: Partially atrophic. No focal abnormality is seen through the motion artifact. Spleen: Surgically absent.  Adrenals/Urinary Tract: Cortical thinning in renal volume loss right-greater-than-left, are again noted, some progression noted since 2021. No mass enhancement of the kidneys is seen through the motion artifact. There is symmetric contrast uptake and excretion. There are Bosniak 2 subcentimeter cortical cysts which are too small to characterize. Some of these are new since 2021. No follow-up imaging is required. There is no urinary stone or obstruction. The bladder is diffusely thickened but also not fully distended. Correlate clinically for cystitis versus muscular hypertrophy. Stomach/Bowel: Fluid filling in the stomach. No overt wall thickening. No small bowel dilatation. Normal appendix. There is mild-to-moderate retained stool in the transverse colon. There is a nonobstructed short loop of the mid descending colon extending  into a lateral abdominal wall hernia sac. No hernia incarceration is suspected. There is no wall thickening or dilatation of the colon, but there is a large amount of stool in the rectosigmoid segment. There are no findings of acute stercoral proctitis but disimpaction is likely indicated. Vascular/Lymphatic: Moderate aortoiliac atherosclerosis. 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 No adenopathy is seen. Reproductive: Mild prostatomegaly. Probable phleboliths. Both testicles are in the scrotal sac. There are postsurgical changes of prior vasectomy. Other: No free fluid, free hemorrhage or free air. Musculoskeletal: Osteopenia. There are mild chronic central endplate compression fractures at L2 and 4. 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 No new osseous abnormality.  No  acute skeletal findings. Review of the MIP images confirms the above findings. IMPRESSION: 1. No evidence of pulmonary arterial dilatation or embolus. 2. Aortic and coronary artery atherosclerosis. 3. Multilobar pneumonia left upper and lower lobes. Follow-up study recommended after treatment to ensure clearing. 4. Diffuse bronchial thickening with increased mosaicism consistent with air trapping and small airways disease. 5. Mildly prominent right hilar lymph nodes, probably reactive. 6. Patulous esophagus with interval new mild to moderate distal esophageal wall thickening. Consider endoscopic follow-up. 7. Constipation with large amount of stool in the rectosigmoid segment. No findings of stercoral proctitis but disimpaction is likely indicated. 8. Left lateral abdominal wall hernia containing a short loop of the mid descending colon without evidence of incarceration or obstruction. 9. Cystitis or bladder hypertrophy versus bladder nondistention. 10. Osteopenia and degenerative change. Chronic compression fractures of T11 and T12 with kyphoplasty cement. 11. 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 12. Emphysema. Aortic Atherosclerosis (ICD10-I70.0) and Emphysema (ICD10-J43.9). Electronically Signed   By: Francis Quam M.D.   On: 01/04/2024 22:22   CT Head Wo Contrast Result Date: 01/04/2024 EXAM: CT HEAD WITHOUT CONTRAST 01/04/2024 08:58:03 PM TECHNIQUE: CT of the head was performed without the administration of intravenous contrast. Automated exposure control, iterative reconstruction, and/or weight based adjustment of the mA/kV was utilized to reduce the radiation dose to as low as reasonably achievable. COMPARISON: 01/07/2020 CLINICAL HISTORY: Syncope/presyncope, cerebrovascular cause suspected. 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 No acute hemorrhage. No evidence of acute infarct. No hydrocephalus. No extra-axial collection. No mass effect or midline shift. Mild age-related atrophy. Subcortical and periventricular small vessel ischemic changes. ORBITS: No acute abnormality. SINUSES: No acute abnormality. SOFT TISSUES AND SKULL: No acute soft tissue abnormality. No skull fracture. Mild intracranial atherosclerosis. IMPRESSION: 1. No acute intracranial abnormality. 2. Old right MCA territory infarct. Small vessel ischemic changes. Electronically signed by: Pinkie Pebbles MD 01/04/2024 09:04 PM EDT RP Workstation: [id number]   DG Chest Port 1 View Result Date: 01/04/2024 CLINICAL DATA:  Questionable sepsis - evaluate for abnormality EXAM: PORTABLE CHEST 1 VIEW COMPARISON:  07/28/2023 FINDINGS: Heart and mediastinal contours are within normal limits. No focal opacities or effusions. No acute bony abnormality. No pneumothorax. IMPRESSION: No active disease. Electronically Signed   By: Franky Crease M.D.   On: 01/04/2024 18:14  Elgie Butter M.D. [facility] 01/06/2024, 2:34 PM  Available via Epic secure chat 7am-7pm After 7 pm, please refer to night coverage provider listed on amion.

## 2024-01-06 NOTE — Progress Notes (Signed)
 He is doing okay this morning.  His platelet count is 69,000 now.  He is on the Decadron .  He got Nplate .  Hopefully, we can hold off on IVIG.  The urine culture still is pending.  His BUN is 30 creatinine 1.55.  Calcium  8.7.  His blood sugar is 111.  We really need to get a PICC line in him.  He has always a peripheral IVs in his right arm.  This is an aggravation for him.  I have him on argatroban .  This is for the aortic thrombus.  This is quite concerning that he has this despite being on the Xarelto  and aspirin .  I think what I will consider for him would be Pradaxa .  This is a direct thrombin inhibitor.  This has a different mechanism of action than Xarelto  does.  I do not see a contraindication for him to be on Pradaxa .  While in the hospital right now, I will continue him on the argatroban .  He continues on his antibiotics.  Again he looks pretty good this morning.  Vital signs show temperature of 98.1.  Pulse 85.  Blood pressure 129/85.  His lungs sound clear bilaterally.  Cardiac exam regular rate and rhythm.  He has no murmurs.  Abdomen is soft.  Bowel sounds are present.  He has no fluid wave.  There is no palpable liver or spleen tip.  Extremities shows the paralysis on the left side.  Right side is relatively unremarkable.  He has decent strength in the muscles on the right side.  Neurological exam shows no focal neurological deficits outside of the left-sided paralysis.  Again, I would switch him over to Pradaxa  when it is time for him to be discharged.  We will have to see what his cultures show with the urine.  He continues on broad-spectrum antibiotic coverage.  Again a PICC line would make life better for him.  We get out some of these IVs and his arm.  I do appreciate the great care he is getting for everybody up on 2 W.  Jeralyn Crease, MD  [place] 2:18

## 2024-01-06 NOTE — TOC Initial Note (Addendum)
 Transition of Care [facility]) - Initial/Assessment Note    Patient Details  Name: Mark [name] MRN: [id number] Date of Birth: 1945-12-17  Transition of Care [facility] CM/SW Contact:    Mark KANDICE Stain, RN Phone Number: 01/06/2024, 1:07 PM  Clinical Narrative:                 Spoke to patient who requested this RNCM call his wife. Mark [name], who states patient is active with [facility].  Patient has all needed DME. Wife can transport patient home and to apts. ICM (Inpatient Care Management) will continue to follow for needs.  1400 This RNCM reached out to [facility] home health who states patient was discharged 12/11/23 and [facility] can't reaccept due to insurance. This RNCM left message with [place] requesting return call.   Expected Discharge Plan: Home w Home Health Services Barriers to Discharge: Continued Medical Work up   Patient Goals and CMS Choice Patient states their goals for this hospitalization and ongoing recovery are:: return home with wife CMS Medicare.gov Compare Post Acute Care list provided to:: Patient Represenative (must comment) Choice offered to / list presented to : Spouse      Expected Discharge Plan and Services   Discharge Planning Services: CM Consult Post Acute Care Choice: Home Health Living arrangements for the past 2 months: Single Family Home                                      Prior Living Arrangements/Services Living arrangements for the past 2 months: Single Family Home Lives with:: Spouse Patient language and need for interpreter reviewed:: Yes Do you feel safe going back to the place where you live?: Yes      Need for Family Participation in Patient Care: Yes (Comment) Care giver support system in place?: Yes (comment) Current home services: Home OT, Home PT, DME Criminal Activity/Legal Involvement Pertinent to Current Situation/Hospitalization: No - Comment as needed  Activities of Daily Living   ADL Screening (condition at  time of admission) Independently performs ADLs?: No Does the patient have a NEW difficulty with bathing/dressing/toileting/self-feeding that is expected to last >3 days?: Yes (Initiates electronic notice to provider for possible OT consult) Does the patient have a NEW difficulty with getting in/out of bed, walking, or climbing stairs that is expected to last >3 days?: Yes (Initiates electronic notice to provider for possible PT consult) Does the patient have a NEW difficulty with communication that is expected to last >3 days?: No Is the patient deaf or have difficulty hearing?: Yes Does the patient have difficulty seeing, even when wearing glasses/contacts?: No Does the patient have difficulty concentrating, remembering, or making decisions?: No  Permission Sought/Granted   Permission granted to share information with : Yes, Verbal Permission Granted     Permission granted to share info w AGENCY: Home health        Emotional Assessment       Orientation: : Oriented to Situation, Oriented to  Time, Oriented to Place, Oriented to Self Alcohol  / Substance Use: Not Applicable Psych Involvement: No (comment)  Admission diagnosis:  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] Sepsis (HCC) [A41.9] Constipation, unspecified constipation type [K59.00] Syncope, unspecified syncope type [R55] Aspiration pneumonia of both lungs, unspecified aspiration pneumonia type, unspecified part of lung (HCC) [J69.0] Sepsis with acute hypoxic respiratory failure and septic shock, due to unspecified organism (HCC) [A41.9,  R65.21, J96.01] Patient Active Problem List   Diagnosis Date Noted   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   Hypotension 08/01/2023   Dyslipidemia 07/28/2023   Depression 07/28/2023   GERD (gastroesophageal reflux disease) 07/28/2023   Seizure (HCC) 07/28/2023   Hypokalemia 07/19/2023   Rash and nonspecific skin eruption 07/18/2023    Neuropathic pain 07/18/2023   Chronic ITP (idiopathic thrombocytopenia) (HCC) 07/17/2023   Prediabetes 07/09/2023   Debility 07/09/2023   Acute ITP (HCC) 07/04/2023   Thrombocytopenia 06/10/2023   Idiopathic thrombocytopenic purpura (ITP) (HCC) 06/09/2023   Lower urinary tract infectious disease 07/04/2020   CKD (chronic kidney disease), stage III (HCC) 07/01/2020   Neurogenic bladder 07/01/2020   Pulmonary nodule 10/24/2016   Acute hypoxemic respiratory failure (HCC) 10/21/2016   Sepsis (HCC) 10/21/2016   DVT (deep venous thrombosis) (HCC) 10/12/2016   Supratherapeutic INR 10/12/2016   Acute deep vein thrombosis (DVT) of popliteal vein of right lower extremity (HCC) 10/12/2016   AKI (acute kidney injury) 10/12/2016   Fever 10/12/2016   Leukocytosis 10/12/2016   Bacteriuria 10/12/2016   Deep vein thrombosis (DVT) (HCC) 03/02/2015   Lupus anticoagulant disorder 03/02/2015   Ischemic stroke (HCC) 10/04/2014   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   Deep vein thrombosis (HCC) 10/04/2014   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 [place] [address] Phone: [phone] Fax: [phone]     Social Drivers of Health (SDOH) Social History: SDOH Screenings   Food Insecurity: No Food Insecurity (01/05/2024)  Housing: Low Risk  (01/05/2024)  Transportation Needs: No Transportation Needs (01/05/2024)  Utilities: Not At Risk (01/05/2024)   Depression (PHQ2-9): Low Risk  (01/02/2024)  Social Connections: Socially Integrated (01/05/2024)  Tobacco Use: Medium Risk (01/04/2024)   SDOH Interventions:     Readmission Risk Interventions    08/02/2023    3:29 PM 07/09/2023   10:34 AM 07/06/2023    1:53 PM  Readmission Risk Prevention Plan  Post Dischage Appt  Complete   Medication Screening  Complete   Transportation Screening Complete Complete Complete  PCP or Specialist Appt within 5-7 Days   Complete  PCP or Specialist Appt within 3-5 Days Complete    Home Care Screening   Complete  Medication Review (RN CM)   Complete  HRI or Home Care Consult Complete    Social Work Consult for Recovery Care Planning/Counseling Complete    Palliative Care Screening Not Applicable    Medication Review Oceanographer) Complete

## 2024-01-06 NOTE — Progress Notes (Signed)
 EEG attempted 2x. Both times patient was unavailable. BSE then PICC placement. Will attempt later when schedule permits.

## 2024-01-06 NOTE — Progress Notes (Signed)
 BSE completed, full report to follow.   Pt's daughter arrived during session.   He demonstrated functional oropharyngeal swallow based on clinical swallow evaluation including 3 ounce water challenge. No S/S of aspiration across all po noted. Pt reports he vomited and then passed out during his meal PTA but does not recall choking on any po.     Per chart review, pt's esophagus was patulous with mild to moderate distal esophageal wall thickening and he has been previously diagnosed with delayed gastric emptying on 2007 testing.  Prior MBS 2018 showed penetration - and pt was advised to tuck his chin with liquids.  Based on pt's report of event and dysphagia symptoms *and daughter confirmation* of coughing further into meals, suspect if he is aspirating, it is due to GI issues.    Using teach back and written precautions, pt and his daughter educated thoroughly to precautions.  Advised pt and daughter if recurrent pneumonia occurs or esophageal dysphagia symptoms noted, he may want to follow up with GI and/or have an esophagram or endoscopy conducted.  Will sign off after full note documented.  Thanks for this consult.   Madelin POUR, MS [facility] SLP Acute [employer] [phone]

## 2024-01-06 NOTE — Evaluation (Signed)
 Clinical/Bedside Swallow Evaluation Patient Details  Name: Mark [name] MRN: [id number] Date of Birth: 1945/06/23  Today's Date: 01/06/2024 Time: SLP Start Time (ACUTE ONLY): 0750 SLP Stop Time (ACUTE ONLY): 0835 SLP Time Calculation (min) (ACUTE ONLY): 45 min  Past Medical History:  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    Lupus anticoagulant disorder    Lupus anticoagulant syndrome    Multiple closed anterior-posterior compression fractures of pelvis (HCC)    Seizures (HCC)    last seizure 10 years ago approx    Sleep apnea    cpap broken x 1 year per wife    Stroke (HCC) 02/27/2011   Left side weakness   Past Surgical History:  Past Surgical History:  Procedure Laterality Date   blood clot Right    surgical removal   CHOLECYSTECTOMY     CORONARY ANGIOPLASTY  1992   CYSTOGRAM N/A 02/17/2020   Procedure: CYSTOGRAM;  Surgeon: Cam Morene ORN, MD;  Location: [facility];  Service: Urology;  Laterality: N/A;   CYSTOSCOPY WITH RETROGRADE PYELOGRAM, URETEROSCOPY AND STENT PLACEMENT Bilateral 02/17/2020   Procedure: REATHER SHIRLY CHARS  URETEROSCOPY AND STENT PLACEMENT;  Surgeon: Cam Morene ORN, MD;  Location: [facility];  Service: Urology;  Laterality: Bilateral;   SPLENECTOMY, TOTAL     vertebralplasty     HPI:  78 yo male adm to [facility] with vomiting and syncope episode - diagnosed with left lobe pna.  Pt with PMH + for CVA with left hemiparesis, ITP on anticoagulant and Lupus.   Delayed gastric emptying diagnosed in 2007 and essentially normal MBS.  More recent MBS in 2018 (SLP read from AIR note) showed laryngeal  penetration with recommendation for chin tuck with liquids.  Pt denies having recurrent pneumonias nor weight loss and reports using caution with intake for his protection.   Daughter arrived during session and reports pt frequently coughs with intake, eats rapidly and requires reminders to avoid talking with meals.  He has an excellent support system.    Assessment / Plan / Recommendation  Clinical Impression  Pt demonstrated functional oropharyngeal swallow based on clinical swallow evaluation including 3 ounce water challenge. No S/S of aspiration across all po noted. Pt reports he vomited and then passed out during his meal PTA but does not recall choking on any po. Suspect his aspiration pna is due to emesis aspiration.  Daughter however report he will cough DURING intake - but also reports he eats at a rapid rate.      Per chart review, on CT abdomen 01/05/2024 pt's esophagus was patulous with mild to moderate distal esophageal wall thickening and small hiatal hernia and he has been previously diagnosed with delayed gastric emptying on 2007 testing.  Prior MBS 2018 showed penetration - and pt was advised to tuck his chin with liquids.     Based on pt's report of event and dysphagia symptoms *and daughter confirmation* of coughing further into meals, suspect if he is aspirating, it is due to GI issues.   Pt reports he prefers to take his  medications with applesauce - encouraged him to start and follow with liquids as able.   He  reports he takes Fosomax - encouraged plenty of water and staying upright with this medication.  Warm liquids or carbonated liuqids may improve esophageal clearance.  Using teach back and written instructions, pt and his daughter educated thoroughly to precautions.     Advised pt and daughter if recurrent pneumonia occurs or esophageal dysphagia symptoms noted, he may want to follow up with GI for recommendations/testing and/or have an esophagram completed. Thanks for this  consult. SLP Visit Diagnosis: Dysphagia, unspecified (R13.10)    Aspiration Risk  Mild aspiration risk    Diet Recommendation Regular;Thin liquid    Liquid Administration via: Cup;Straw Medication Administration: Whole meds with puree (per pt preference) Supervision: Patient able to self feed Compensations: Slow rate;Small sips/bites (start intake with liquids) Postural Changes: Seated upright at 90 degrees;Remain upright for at least 30 minutes after po intake;Other (Comment)    Other  Recommendations Oral Care Recommendations: Oral care BID     Assistance Recommended at Discharge    Functional Status Assessment Patient has not had a recent decline in their functional status  Frequency and Duration     N/a       Prognosis   N/a     Swallow Study   General Date of Onset: 01/06/24 HPI: 78 yo male adm to [facility] with vomiting and syncope episode - diagnosed with left lobe pna.  Pt with PMH + for CVA with left hemiparesis, ITP on anticoagulant and Lupus.   Delayed gastric emptying diagnosed in 2007 and essentially normal MBS.  More recent MBS in 2018 (SLP read from AIR note) showed laryngeal penetration with recommendation for chin tuck with liquids.  Pt denies having recurrent pneumonias nor weight loss and reports using caution with intake for his protection.   Daughter arrived during session and reports pt frequently coughs with intake, eats rapidly and requires reminders to avoid talking with meals.  He has an excellent support system. Type of Study: Bedside Swallow Evaluation Previous Swallow Assessment: see HPI Diet Prior to this Study: Regular;Thin liquids (Level 0) Temperature Spikes Noted: No Respiratory Status: Room air History of Recent Intubation: No Behavior/Cognition: Alert;Cooperative;Pleasant mood Oral Cavity Assessment: Within Functional Limits Oral Care Completed by SLP: No Oral Cavity - Dentition: Other (Comment);Missing dentition (some dentition missing) Vision:  Functional for self-feeding Self-Feeding Abilities: Able to feed self Patient Positioning: Upright in bed Baseline Vocal Quality: Normal Volitional Cough: Other (Comment) (difficult for pt to perform - sounded like a grunt rather than a cough) Volitional Swallow: Able to elicit    Oral/Motor/Sensory Function Overall Oral Motor/Sensory Function: Within functional limits   Ice Chips Ice chips: Not tested   Thin Liquid Thin Liquid: Within functional limits Presentation: Self Fed;Cup    Nectar Thick Nectar Thick Liquid: Not tested   Honey Thick Honey Thick Liquid: Not tested   Puree Puree: Not tested   Solid     Solid: Within functional limits Presentation: Self Georgiana Nicolas Emmie Jenkins 01/06/2024,9:43 AM  Madelin POUR, MS [facility] SLP Acute Rehab Services Office [phone]

## 2024-01-06 NOTE — Progress Notes (Signed)
 PHARMACY - ANTICOAGULATION CONSULT NOTE  Pharmacy Consult for argatroban  Indication: hypercoagulable state (lupus anticoagulant, h/o DVT, now with infrarenal aortic thrombus)  Allergies  Allergen Reactions   Benadryl  [Diphenhydramine ] Other (See Comments)    Affects kidneys   Pepcid  [Famotidine ] Other (See Comments)    Affects kidneys   Beef (Diagnostic) Other (See Comments)    Vegetarian   Chicken Meat (Diagnostic) Other (See Comments)    Vegetarian    Food Other (See Comments)    Animal meat product - pt is vegetarian   Pork (Diagnostic) Other (See Comments)    Vegetarian     Patient Measurements: Height: 5' 10 (177.8 cm) Weight: 82.1 kg (181 lb) IBW/kg (Calculated) : 73 HEPARIN  DW (KG): 82.1  Vital Signs: Temp: 98.1 F (36.7 C) (09/23 0400) Temp Source: Oral (09/23 0400) BP: 129/85 (09/23 0400) Pulse Rate: 85 (09/23 0400)  Labs: Recent Labs    01/04/24 1729 01/04/24 1750 01/04/24 2134 01/05/24 0625 01/05/24 2008 01/06/24 0506 01/06/24 0840  HGB 12.0* 12.2*  --  10.6* 9.4* 9.5*  --   HCT 39.0 36.0*  --  34.0* 29.5* 30.1*  --   PLT 42*  --   --  43* 54* 69*  --   APTT 63*  --   --   --  52* 79* 68*  LABPROT 26.5*  --   --   --   --   --   --   INR 2.3*  --   --   --   --   --   --   CREATININE 1.62* 1.70*  --  1.68*  --  1.55*  --   TROPONINIHS 10  --  9  --   --   --   --     Estimated Creatinine Clearance: 40.6 mL/min (A) (by C-G formula based on SCr of 1.55 mg/dL (H)).   Assessment: 78 y.o. M presents with syncopal episode. Pt on Xarelto  pta for h/o DVT (lupus anticoagulant). 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 Hem/onc (Dr. Timmy) recommends argatroban  for now. Baseline aPTT 52 seconds.  aPTT this morning is therapeutic at 68 seconds, H/H stable, pltc up to 69k. No bleeding concerns per RN.   Goal of Therapy:  aPTT 50-90 sec Monitor platelets by  anticoagulation protocol: Yes   Plan:  Continue argatroban  at 0.25 mcg/kg/min  Daily aPTT, CBC  Mark [name], PharmD, BCPS, [facility] Clinical Pharmacist [phone] Please check AMION for all [facility] Pharmacy numbers 01/06/2024

## 2024-01-06 NOTE — Plan of Care (Signed)
  Problem: Education: Goal: Knowledge of General Education information will improve Description: Including pain rating scale, medication(s)/side effects and non-pharmacologic comfort measures Outcome: Progressing   Problem: Clinical Measurements: Goal: Ability to maintain clinical measurements within normal limits will improve Outcome: Progressing Goal: Will remain free from infection Outcome: Progressing Goal: Cardiovascular complication will be avoided Outcome: Progressing   Problem: Pain Managment: Goal: General experience of comfort will improve and/or be controlled Outcome: Progressing   Problem: Safety: Goal: Ability to remain free from injury will improve Outcome: Progressing   Problem: Pain Managment: Goal: General experience of comfort will improve and/or be controlled Outcome: Progressing

## 2024-01-06 NOTE — Progress Notes (Signed)
 Peripherally Inserted Central Catheter Placement  The IV Nurse has discussed with the patient and/or persons authorized to consent for the patient, the purpose of this procedure and the potential benefits and risks involved with this procedure.  The benefits include less needle sticks, lab draws from the catheter, and the patient may be discharged home with the catheter. Risks include, but not limited to, infection, bleeding, blood clot (thrombus formation), and puncture of an artery; nerve damage and irregular heartbeat and possibility to perform a PICC exchange if needed/ordered by physician.  Alternatives to this procedure were also discussed.  Bard Power PICC patient education guide, fact sheet on infection prevention and patient information card has been provided to patient /or left at bedside.    PICC Placement Documentation  PICC Double Lumen 01/06/24 Right Basilic 38 cm 0 cm (Active)  Indication for Insertion or Continuance of Line Limited venous access - need for IV therapy >5 days (PICC only) 01/06/24 0918  Exposed Catheter (cm) 0 cm 01/06/24 0918  Site Assessment Clean, Dry, Intact 01/06/24 0918  Lumen #1 Status Flushed;Saline locked;Blood return noted 01/06/24 0918  Lumen #2 Status Flushed;Saline locked;Blood return noted 01/06/24 0918  Dressing Type Transparent;Securing device 01/06/24 9081  Dressing Status Antimicrobial disc/dressing in place;Clean, Dry, Intact 01/06/24 0918  Line Care Connections checked and tightened 01/06/24 9081  Line Adjustment (NICU/IV Team Only) No 01/06/24 0918  Dressing Intervention New dressing;Adhesive placed at insertion site (IV team only) 01/06/24 0918  Dressing Change Due 01/13/24 01/06/24 0918       Jolee Na 01/06/2024, 9:20 AM

## 2024-01-07 DIAGNOSIS — I7410 Embolism and thrombosis of unspecified parts of aorta: Secondary | ICD-10-CM | POA: Diagnosis not present

## 2024-01-07 LAB — RETICULOCYTES
Immature Retic Fract: 17.5 % — ABNORMAL HIGH (ref 2.3–15.9)
RBC.: 3.28 MIL/uL — ABNORMAL LOW (ref 4.22–5.81)
Retic Count, Absolute: 43.6 K/uL (ref 19.0–186.0)
Retic Ct Pct: 1.3 % (ref 0.4–3.1)

## 2024-01-07 LAB — COMPREHENSIVE METABOLIC PANEL WITH GFR
ALT: 22 U/L (ref 0–44)
AST: 21 U/L (ref 15–41)
Albumin: 3 g/dL — ABNORMAL LOW (ref 3.5–5.0)
Alkaline Phosphatase: 58 U/L (ref 38–126)
Anion gap: 10 (ref 5–15)
BUN: 32 mg/dL — ABNORMAL HIGH (ref 8–23)
CO2: 20 mmol/L — ABNORMAL LOW (ref 22–32)
Calcium: 8.7 mg/dL — ABNORMAL LOW (ref 8.9–10.3)
Chloride: 111 mmol/L (ref 98–111)
Creatinine, Ser: 1.27 mg/dL — ABNORMAL HIGH (ref 0.61–1.24)
GFR, Estimated: 58 mL/min — ABNORMAL LOW (ref >60)
Glucose, Bld: 135 mg/dL — ABNORMAL HIGH (ref 70–99)
Potassium: 4.1 mmol/L (ref 3.5–5.1)
Sodium: 141 mmol/L (ref 135–145)
Total Bilirubin: 0.5 mg/dL (ref 0.0–1.2)
Total Protein: 5.9 g/dL — ABNORMAL LOW (ref 6.5–8.1)

## 2024-01-07 LAB — IRON AND TIBC
Iron: 39 ug/dL — ABNORMAL LOW (ref 45–182)
Saturation Ratios: 15 % — ABNORMAL LOW (ref 17.9–39.5)
TIBC: 253 ug/dL (ref 250–450)
UIBC: 214 ug/dL

## 2024-01-07 LAB — CBC WITH DIFFERENTIAL/PLATELET
Abs Immature Granulocytes: 0.16 K/uL — ABNORMAL HIGH (ref 0.00–0.07)
Basophils Absolute: 0 K/uL (ref 0.0–0.1)
Basophils Relative: 0 %
Eosinophils Absolute: 0 K/uL (ref 0.0–0.5)
Eosinophils Relative: 0 %
HCT: 27.3 % — ABNORMAL LOW (ref 39.0–52.0)
Hemoglobin: 8.4 g/dL — ABNORMAL LOW (ref 13.0–17.0)
Immature Granulocytes: 1 %
Lymphocytes Relative: 11 %
Lymphs Abs: 1.7 K/uL (ref 0.7–4.0)
MCH: 25.8 pg — ABNORMAL LOW (ref 26.0–34.0)
MCHC: 30.8 g/dL (ref 30.0–36.0)
MCV: 83.7 fL (ref 80.0–100.0)
Monocytes Absolute: 1.8 K/uL — ABNORMAL HIGH (ref 0.1–1.0)
Monocytes Relative: 12 %
Neutro Abs: 11.7 K/uL — ABNORMAL HIGH (ref 1.7–7.7)
Neutrophils Relative %: 76 %
Platelets: 90 K/uL — ABNORMAL LOW (ref 150–400)
RBC: 3.26 MIL/uL — ABNORMAL LOW (ref 4.22–5.81)
RDW: 19.9 % — ABNORMAL HIGH (ref 11.5–15.5)
WBC: 15.3 K/uL — ABNORMAL HIGH (ref 4.0–10.5)
nRBC: 0 % (ref 0.0–0.2)

## 2024-01-07 LAB — APTT
aPTT: 53 s — ABNORMAL HIGH (ref 24–36)
aPTT: 56 s — ABNORMAL HIGH (ref 24–36)
aPTT: 48 s — ABNORMAL HIGH (ref 24–36)

## 2024-01-07 NOTE — Progress Notes (Signed)
 PHARMACY - ANTICOAGULATION CONSULT NOTE  Pharmacy Consult for argatroban  Indication: hypercoagulable state (lupus anticoagulant, h/o DVT, now with infrarenal aortic thrombus)  Allergies  Allergen Reactions   Benadryl  [Diphenhydramine ] Other (See Comments)    Affects kidneys   Pepcid  [Famotidine ] Other (See Comments)    Affects kidneys   Beef (Diagnostic) Other (See Comments)    Vegetarian   Chicken Meat (Diagnostic) Other (See Comments)    Vegetarian    Food Other (See Comments)    Animal meat product - pt is vegetarian   Pork (Diagnostic) Other (See Comments)    Vegetarian     Patient Measurements: Height: 5' 10 (177.8 cm) Weight: 82.1 kg (181 lb) IBW/kg (Calculated) : 73 HEPARIN  DW (KG): 82.1  Vital Signs: Temp: 97.4 F (36.3 C) (09/24 0520) Temp Source: Oral (09/24 0520) BP: 105/62 (09/24 0520) Pulse Rate: 67 (09/24 0520)  Labs: Recent Labs    01/04/24 1729 01/04/24 1750 01/04/24 2134 01/05/24 0625 01/05/24 2008 01/06/24 0506 01/06/24 0840 01/07/24 0149  HGB 12.0*   < >  --  10.6* 9.4* 9.5*  --  8.4*  HCT 39.0   < >  --  34.0* 29.5* 30.1*  --  27.3*  PLT 42*  --   --  43* 54* 69*  --  90*  APTT 63*  --   --   --  52* 79* 68* 53*  LABPROT 26.5*  --   --   --   --   --   --   --   INR 2.3*  --   --   --   --   --   --   --   CREATININE 1.62*   < >  --  1.68*  --  1.55*  --  1.27*  TROPONINIHS 10  --  9  --   --   --   --   --    < > = values in this interval not displayed.    Estimated Creatinine Clearance: 49.5 mL/min (A) (by C-G formula based on SCr of 1.27 mg/dL (H)).   Assessment: 78 y.o. M presents with syncopal episode. Pt on Xarelto  pta for h/o DVT (lupus anticoagulant). 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 Hem/onc (Dr. Timmy) recommends argatroban  for now. Baseline aPTT 52 seconds.  aPTT this morning is therapeutic at 53 seconds, H/H dropping  slowly, pltc up to 90k. No bleeding concerns per RN.   Goal of Therapy:  aPTT 50-90 sec Monitor platelets by anticoagulation protocol: Yes   Plan:  Increase argatroban  to 0.28 mcg/kg/min  aPTT in 6 hours Daily aPTT, CBC  Donny Alert, PharmD, [facility] Clinical Pharmacist Please see AMION for all Pharmacists' Contact Phone Numbers 01/07/2024, 7:48 AM

## 2024-01-07 NOTE — Progress Notes (Signed)
 PROGRESS NOTE  Mark [name] FMW:[id number] DOB: 07-09-1945 DOA: 01/04/2024 PCP: Teresa Channel, MD   LOS: 3 days   Brief narrative:  Mark [name] is a 78 y.o. male with medical history significant for CVA with residual left-sided deficits, ITP, lupus anticoagulant, neurogenic bladder, seizure disorder, teflaro for stenting or syncope while sitting down eating with his wife.  CT angio chest revealed no evidence of pulmonary embolism however showed multifocal infiltrates in the left upper and lower lobes.  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  Urinalysis was positive for pyuria.  Urine culture and blood cultures were sent, patient was given broad-spectrum antibiotic and was admitted hospital for further evaluation and treatment.      Assessment/Plan: Principal Problem:   Sepsis (HCC) Active Problems:   Aortic thrombus (HCC)  Assessment and Plan:    Left lobar pneumonia Currently on cefepime  and Flagyl .  Temperature max of 99.1 F.  Has leukocytosis at 15.3.  On room air.  Does not have much pulmonary symptoms at this time.   Syncope of unclear etiology Ct head showing  old right MCA infarct.  EKG showed cortical dysfunction from the right temporal region likely secondary to underlying structural abnormality.  No seizures were reported.  Will need orthostatic blood pressure evaluation.   Infrarenal aortic thrombus  on argatroban  for anticoagulation.  Vascular surgery saw the patient and recommended outpatient follow-up with bilateral lower extremity duplexes.  Continue statin.  Oncology following.  No evidence of bleeding.    Idiopathic thrombocytopenic purpura Oncology on board and on Decadron  10 g IV daily for 3 days.  Monitor CBC daily.  Latest platelet count of 90.    Hyperlipidemia Continue  Crestor     H/o CVA with left sided residual deficits History Referral seen the patient and recommend home health PT on discharge  Abnormal urinalysis.  Cultures negative so far.  On antibiotic.  Will continue  Stage I pressure injury noted over the right lateral back.  Continue prevention protocol. Wound 01/06/24 1212 Pressure Injury Back Lateral;Right;Upper Stage 1 -  Intact skin with non-blanchable redness of a localized area usually over a bony prominence. (Active)   DVT prophylaxis: Argatroban  drip   Disposition: Home with home health  Status is: Inpatient Remains inpatient appropriate because: Pending clinical improvement, argatroban  drip, IV antibiotic,    Code Status:     Code Status: Limited: Do not attempt resuscitation (DNR) -DNR-LIMITED -Do Not Intubate/DNI   Family Communication: None at bedside   Consultants: Oncology  Procedures: Right upper extremity PICC line  Anti-infectives:  Cefepime  and metronidazole   Anti-infectives (From admission, onward)    Start     Dose/Rate Route Frequency Ordered Stop   01/05/24 1000  metroNIDAZOLE  (FLAGYL ) IVPB 500 mg        500 mg 100 mL/hr over 60 Minutes Intravenous Every 12 hours 01/05/24 0118     01/05/24 0800  ceFEPIme  (MAXIPIME ) 2 g in sodium chloride  0.9 % 100 mL IVPB        2 g 200 mL/hr over 30 Minutes Intravenous 2 times daily 01/05/24 0016     01/04/24 1745  ceFEPIme  (MAXIPIME ) 2 g in sodium chloride  0.9 % 100 mL IVPB        2 g 200 mL/hr over 30 Minutes Intravenous  Once 01/04/24 1733 01/04/24 1829   01/04/24 1745  metroNIDAZOLE  (FLAGYL ) IVPB 500 mg        500 mg 100 mL/hr over 60 Minutes Intravenous  Once 01/04/24 1733 01/04/24 1927   01/04/24 1745  vancomycin  (VANCOCIN ) IVPB 1000 mg/200 mL premix  Status:  Discontinued        1,000 mg 200 mL/hr over 60 Minutes Intravenous  Once 01/04/24 1733 01/04/24 1734   01/04/24 1745  vancomycin  (VANCOREADY) IVPB 1750 mg/350 mL        1,750 mg 175 mL/hr over 120  Minutes Intravenous  Once 01/04/24 1734 01/04/24 2302        Subjective: Today, patient was seen and examined at bedside.  Patient states that he has no nausea or vomiting.  Has been having some itching.  Has had bowel movement.  Denies any overt for dyspnea or shortness of breath.  Objective: Vitals:   01/07/24 0520 01/07/24 0800  BP: 105/62 130/76  Pulse: 67 92  Resp:  18  Temp: (!) 97.4 F (36.3 C) 99.1 F (37.3 C)  SpO2: 95%     Intake/Output Summary (Last 24 hours) at 01/07/2024 1056 Last data filed at 01/07/2024 0532 Gross per 24 hour  Intake 75 ml  Output 3600 ml  Net -3525 ml   Filed Weights   01/05/24 1646  Weight: 82.1 kg   Body mass index is 25.97 kg/m.   Physical Exam: GENERAL: Patient is alert awake and oriented. Not in obvious distress.  Elderly male, Communicative, HENT: No scleral pallor or icterus. Pupils equally reactive to light. Oral mucosa is moist NECK: is supple, no gross swelling noted. CHEST: Clear to auscultation.   Diminished breath sounds bilaterally. CVS: S1 and S2 heard, no murmur. Regular rate and rhythm.  ABDOMEN: Soft, non-tender, bowel sounds are present. EXTREMITIES: No edema.  Right upper extremity PICC line in place. CNS: Cranial nerves are intact. No focal motor deficits. SKIN: warm and dry without rashes.  Data Review: I have personally reviewed the following laboratory data and studies,  CBC: Recent Labs  Lab 01/04/24 1729 01/04/24 1750 01/05/24 0625 01/05/24 2008 01/06/24 0506 01/07/24 0149  WBC 15.2*  --  23.2* 16.1* 17.7* 15.3*  NEUTROABS 11.6*  --   --  12.8* 12.0* 11.7*  HGB 12.0* 12.2* 10.6* 9.4* 9.5* 8.4*  HCT 39.0 36.0* 34.0* 29.5* 30.1* 27.3*  MCV 85.0  --  84.0 81.7 81.6 83.7  PLT 42*  --  43* 54* 69* 90*   Basic Metabolic Panel: Recent Labs  Lab 01/04/24 1729 01/04/24 1750 01/05/24 0625 01/06/24 0506 01/07/24 0149  NA 141 142 136 141 141  K 4.6 5.1 4.3 3.9 4.1  CL 110 110 109 111 111  CO2 19*   --  17* 19* 20*  GLUCOSE 139* 144* 160* 111* 135*  BUN 27* 37* 29* 30* 32*  CREATININE 1.62* 1.70* 1.68* 1.55* 1.27*  CALCIUM  8.6*  --  8.2* 8.7* 8.7*  MG  --   --  1.9  --   --   PHOS  --   --  2.8  --   --    Liver Function Tests: Recent Labs  Lab 01/04/24 1729 01/07/24 0149  AST 29 21  ALT 28 22  ALKPHOS 79 58  BILITOT 0.7 0.5  PROT 6.8 5.9*  ALBUMIN 3.5 3.0*   Recent Labs  Lab 01/04/24 1729  LIPASE 22   No results for input(s): AMMONIA in the last 168 hours. Cardiac Enzymes: No results for input(s): CKTOTAL, CKMB, CKMBINDEX, TROPONINI in the last  168 hours. BNP (last 3 results) No results for input(s): BNP in the last 8760 hours.  ProBNP (last 3 results) No results for input(s): PROBNP in the last 8760 hours.  CBG: No results for input(s): GLUCAP in the last 168 hours. Recent Results (from the past 240 hours)  Resp panel by RT-PCR (RSV, Flu A&B, Covid)     Status: None   Collection Time: 01/04/24  5:29 PM   Specimen: Nasal Swab  Result Value Ref Range Status   SARS Coronavirus 2 by RT PCR NEGATIVE NEGATIVE Final   Influenza A by PCR NEGATIVE NEGATIVE Final   Influenza B by PCR NEGATIVE NEGATIVE Final    Comment: (NOTE) The Xpert Xpress SARS-CoV-2/FLU/RSV plus assay is intended as an aid in the diagnosis of influenza from Nasopharyngeal swab specimens and should not be used as a sole basis for treatment. Nasal washings and aspirates are unacceptable for Xpert Xpress SARS-CoV-2/FLU/RSV testing.  Fact Sheet for Patients: BloggerCourse.com  Fact Sheet for Healthcare Providers: SeriousBroker.it  This test is not yet approved or cleared by the [place]  FDA and has been authorized for detection and/or diagnosis of SARS-CoV-2 by FDA under an Emergency Use Authorization (EUA). This EUA will remain in effect (meaning this test can be used) for the duration of the COVID-19 declaration under  Section 564(b)(1) of the Act, 21 U.S.C. section 360bbb-3(b)(1), unless the authorization is terminated or revoked.     Resp Syncytial Virus by PCR NEGATIVE NEGATIVE Final    Comment: (NOTE) Fact Sheet for Patients: BloggerCourse.com  Fact Sheet for Healthcare Providers: SeriousBroker.it  This test is not yet approved or cleared by the [place]  FDA and has been authorized for detection and/or diagnosis of SARS-CoV-2 by FDA under an Emergency Use Authorization (EUA). This EUA will remain in effect (meaning this test can be used) for the duration of the COVID-19 declaration under Section 564(b)(1) of the Act, 21 U.S.C. section 360bbb-3(b)(1), unless the authorization is terminated or revoked.  Performed at [facility], [address] [place], [place] [address]   Blood Culture (routine x 2)     Status: None (Preliminary result)   Collection Time: 01/04/24  5:29 PM   Specimen: BLOOD  Result Value Ref Range Status   Specimen Description BLOOD SITE NOT SPECIFIED  Final   Special Requests   Final    BOTTLES DRAWN AEROBIC AND ANAEROBIC Blood Culture results may not be optimal due to an inadequate volume of blood received in culture bottles   Culture   Final    NO GROWTH 3 DAYS Performed at [facility], [address], [place], [place] [address]    Report Status PENDING  Incomplete  Urine Culture     Status: Abnormal   Collection Time: 01/04/24  5:32 PM   Specimen: Urine, Random  Result Value Ref Range Status   Specimen Description URINE, RANDOM  Final   Special Requests NONE Reflexed from [id number]  Final   Culture (A)  Final    <10,000 COLONIES/mL INSIGNIFICANT GROWTH Performed at [facility], [address], [place], [place] [address]    Report Status 01/05/2024 FINAL  Final  Blood Culture (routine x 2)     Status: None (Preliminary result)   Collection Time: 01/04/24  5:34 PM   Specimen: BLOOD  Result  Value Ref Range Status   Specimen Description BLOOD SITE NOT SPECIFIED  Final   Special Requests   Final    BOTTLES DRAWN AEROBIC  AND ANAEROBIC Blood Culture results may not be optimal due to an inadequate volume of blood received in culture bottles   Culture   Final    NO GROWTH 3 DAYS Performed at [facility], [address], [place], [place] [address]    Report Status PENDING  Incomplete  MRSA Next Gen by PCR, Nasal     Status: None   Collection Time: 01/06/24 10:01 AM   Specimen: Nasal Mucosa; Nasal Swab  Result Value Ref Range Status   MRSA by PCR Next Gen NOT DETECTED NOT DETECTED Final    Comment: (NOTE) The GeneXpert MRSA Assay (FDA approved for NASAL specimens only), is one component of a comprehensive MRSA colonization surveillance program. It is not intended to diagnose MRSA infection nor to guide or monitor treatment for MRSA infections. Test performance is not FDA approved in patients less than 78 years old. Performed at [facility], [address], [place], [place] [address]      Studies: DG ESOPHAGUS W SINGLE CM (SOL OR THIN BA) Result Date: 01/06/2024 CLINICAL DATA:  Recent CVA with left hemiparesis. Dysphagia, coughing when eating. Consult for esophagram for further evaluation. EXAM: ESOPHAGUS/BARIUM SWALLOW/TABLET STUDY TECHNIQUE: Single contrast examination was performed using thin liquid barium. Exam significantly limited secondary to poor patient mobility. Patient unable to stand or easily rotate on fluoro table. This exam was performed by Kimble Clas, PA-C, and was supervised and interpreted by Dr. MARLA Kilts. FLUOROSCOPY: Radiation Exposure Index (as provided by the fluoroscopic device): 36.30 mGy Kerma COMPARISON:  None Available. FINDINGS: Swallowing: Not directly assessed due to limited study. Pharynx: Not directly assessed due to limited study Esophagus: Mildly tortuous, otherwise unremarkable. Esophageal motility: Moderate esophageal dysmotility  with proximal escape waves and contrast stasis in the upper esophagus. Hiatal Hernia: Tiny hiatal hernia. Gastroesophageal reflux: None visualized. Ingested 13 mm barium tablet: Delayed passage. Became temporarily stuck in lower esophagus just above the GE junction. Passed into the stomach with assistance after drinking thin barium. Other: None. IMPRESSION: 1. Focused, limited exam. 2.  Tiny hiatal hernia. 3. Mild esophageal dysmotility, likely presbyesophagus. 4. Delayed passage of 13 mm barium tablet. Became stuck near GE junction. No underlying stricture. Likely due to dysmotility. Electronically Signed   By: Rockey Kilts M.D.   On: 01/06/2024 15:21   EEG adult Result Date: 01/06/2024 Shelton Arlin KIDD, MD     01/06/2024 11:47 AM Patient Name: Mark [name] MRN: [id number] Epilepsy Attending: Arlin KIDD Shelton Referring Physician/Provider: Cherlyn Labella, MD Date: 01/06/2024 Duration: 22.49 mins Patient history: 78yo M with syncope. EEG to evaluate for seizure Level of alertness: Awake AEDs during EEG study: None Technical aspects: This EEG study was done with scalp electrodes positioned according to the 10-20 International system of electrode placement. Electrical activity was reviewed with band pass filter of 1-70Hz , sensitivity of 7 uV/mm, display speed of 57mm/sec with a 60Hz  notched filter applied as appropriate. EEG data were recorded continuously and digitally stored.  Video monitoring was available and reviewed as appropriate. Description: The posterior dominant rhythm consists of 9 Hz activity of moderate voltage (25-35 uV) seen predominantly in posterior head regions, symmetric and reactive to eye opening and eye closing. There is intermittent 3 to 6 Hz theta-delta slowing in right temporal region. Hyperventilation and photic stimulation were not performed.   ABNORMALITY - Intermittent slow, right temporal region IMPRESSION: This study is suggestive of cortical dysfunction arising from right temporal  region likely secondary to underlying structural abnormality. No  seizures or epileptiform discharges were seen throughout the recording. Priyanka MALVA Krebs   US  EKG SITE RITE Result Date: 01/06/2024 If [facility] image not attached, placement could not be confirmed due to current cardiac rhythm.  ECHOCARDIOGRAM COMPLETE Result Date: 01/05/2024    ECHOCARDIOGRAM REPORT   Patient Name:   Mark [name] Date of Exam: 01/05/2024 Medical Rec #:  [id number]       Height:       70.0 in Accession #:    [id number]      Weight:       181.0 lb Date of Birth:  [birth year]       BSA:          2.001 m Patient Age:    78 years        BP:           123/78 mmHg Patient Gender: M               HR:           80 bpm. Exam Location:  Inpatient Procedure: 2D Echo, Cardiac Doppler and Color Doppler (Both Spectral and Color            Flow Doppler were utilized during procedure). Indications:    Syncope R55  History:        Patient has prior history of Echocardiogram examinations, most                 recent 10/22/2016. Stroke and CKD, stage 3,                 Signs/Symptoms:Hypotension; Risk Factors:Sleep Apnea.  Sonographer:    Thea Norlander RCS Referring Phys: CAROLE N HALL IMPRESSIONS  1. Left ventricular ejection fraction, by estimation, is 55 to 60%. The left ventricle has normal function. The left ventricle has no regional wall motion abnormalities. Left ventricular diastolic parameters are consistent with Grade I diastolic dysfunction (impaired relaxation).  2. Right ventricular systolic function is normal. The right ventricular size is normal. Tricuspid regurgitation signal is inadequate for assessing PA pressure.  3. The mitral valve is normal in structure. No evidence of mitral valve regurgitation. No evidence of mitral stenosis.  4. The aortic valve is tricuspid. Aortic valve regurgitation is not visualized. No aortic stenosis is present.  5. The inferior vena cava is normal in size with greater than 50% respiratory  variability, suggesting right atrial pressure of 3 mmHg. FINDINGS  Left Ventricle: Left ventricular ejection fraction, by estimation, is 55 to 60%. The left ventricle has normal function. The left ventricle has no regional wall motion abnormalities. The left ventricular internal cavity size was normal in size. There is  no left ventricular hypertrophy. Left ventricular diastolic parameters are consistent with Grade I diastolic dysfunction (impaired relaxation). Right Ventricle: The right ventricular size is normal. No increase in right ventricular wall thickness. Right ventricular systolic function is normal. Tricuspid regurgitation signal is inadequate for assessing PA pressure. Left Atrium: Left atrial size was normal in size. Right Atrium: Right atrial size was normal in size. Pericardium: Trivial pericardial effusion is present. Mitral Valve: The mitral valve is normal in structure. No evidence of mitral valve regurgitation. No evidence of mitral valve stenosis. Tricuspid Valve: The tricuspid valve is normal in structure. Tricuspid valve regurgitation is not demonstrated. Aortic Valve: The aortic valve is tricuspid. Aortic valve regurgitation is not visualized. No aortic stenosis is present. Aortic valve peak gradient measures 6.5 mmHg. Pulmonic Valve: The pulmonic valve was normal  in structure. Pulmonic valve regurgitation is not visualized. Aorta: The aortic root is normal in size and structure. Venous: The inferior vena cava is normal in size with greater than 50% respiratory variability, suggesting right atrial pressure of 3 mmHg. IAS/Shunts: No atrial level shunt detected by color flow Doppler.  LEFT VENTRICLE PLAX 2D LVIDd:         2.50 cm   Diastology LVIDs:         1.70 cm   LV e' medial:    9.36 cm/s LV PW:         1.10 cm   LV E/e' medial:  6.8 LV IVS:        0.90 cm   LV e' lateral:   10.80 cm/s LVOT diam:     2.10 cm   LV E/e' lateral: 5.9 LV SV:         49 LV SV Index:   25 LVOT Area:     3.46 cm   RIGHT VENTRICLE             IVC RV S prime:     13.20 cm/s  IVC diam: 1.90 cm TAPSE (M-mode): 2.2 cm LEFT ATRIUM             Index        RIGHT ATRIUM           Index LA diam:        3.70 cm 1.85 cm/m   RA Area:     13.10 cm LA Vol (A2C):   49.6 ml 24.79 ml/m  RA Volume:   32.90 ml  16.44 ml/m LA Vol (A4C):   37.1 ml 18.54 ml/m LA Biplane Vol: 47.1 ml 23.54 ml/m  AORTIC VALVE AV Area (Vmax): 2.21 cm AV Vmax:        127.00 cm/s AV Peak Grad:   6.5 mmHg LVOT Vmax:      81.20 cm/s LVOT Vmean:     51.400 cm/s LVOT VTI:       0.142 m  AORTA Ao Root diam: 3.50 cm Ao Asc diam:  3.40 cm MITRAL VALVE MV Area (PHT): 3.03 cm    SHUNTS MV Decel Time: 250 msec    Systemic VTI:  0.14 m MV E velocity: 63.40 cm/s  Systemic Diam: 2.10 cm MV A velocity: 91.70 cm/s MV E/A ratio:  0.69 Dalton McleanMD Electronically signed by Ezra Kanner Signature Date/Time: 01/05/2024/10:38:03 PM    Final       Vernal Alstrom, MD  [facility] 01/07/2024  If 7PM-7AM, please contact night-coverage

## 2024-01-07 NOTE — Progress Notes (Signed)
 PHARMACY - ANTICOAGULATION CONSULT NOTE  Pharmacy Consult for argatroban  Indication: hypercoagulable state (lupus anticoagulant, h/o DVT, now with infrarenal aortic thrombus)  Allergies  Allergen Reactions   Benadryl  [Diphenhydramine ] Other (See Comments)    Affects kidneys   Pepcid  [Famotidine ] Other (See Comments)    Affects kidneys   Beef (Diagnostic) Other (See Comments)    Vegetarian   Chicken Meat (Diagnostic) Other (See Comments)    Vegetarian    Food Other (See Comments)    Animal meat product - pt is vegetarian   Pork (Diagnostic) Other (See Comments)    Vegetarian     Patient Measurements: Height: 5' 10 (177.8 cm) Weight: 82.1 kg (181 lb) IBW/kg (Calculated) : 73 HEPARIN  DW (KG): 82.1  Vital Signs: Temp: 99.1 F (37.3 C) (09/24 1200) Temp Source: Oral (09/24 1200) BP: 144/76 (09/24 1200) Pulse Rate: 69 (09/24 1200)  Labs: Recent Labs    01/04/24 1729 01/04/24 1750 01/04/24 2134 01/05/24 0625 01/05/24 2008 01/06/24 0506 01/06/24 0840 01/07/24 0149 01/07/24 1430  HGB 12.0*   < >  --  10.6* 9.4* 9.5*  --  8.4*  --   HCT 39.0   < >  --  34.0* 29.5* 30.1*  --  27.3*  --   PLT 42*  --   --  43* 54* 69*  --  90*  --   APTT 63*  --   --   --  52* 79* 68* 53* 56*  LABPROT 26.5*  --   --   --   --   --   --   --   --   INR 2.3*  --   --   --   --   --   --   --   --   CREATININE 1.62*   < >  --  1.68*  --  1.55*  --  1.27*  --   TROPONINIHS 10  --  9  --   --   --   --   --   --    < > = values in this interval not displayed.    Estimated Creatinine Clearance: 49.5 mL/min (A) (by C-G formula based on SCr of 1.27 mg/dL (H)).   Assessment: 78 y.o. M presents with syncopal episode. Pt on Xarelto  pta for h/o DVT (lupus anticoagulant). 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 Hem/onc (Dr. Timmy) recommends argatroban  for now. Baseline aPTT 52 seconds.  aPTT is  therapeutic at 56 seconds, H/H dropping slowly, pltc up to 90k. No bleeding concerns per RN.   Goal of Therapy:  aPTT 50-90 sec Monitor platelets by anticoagulation protocol: Yes   Plan:   Empiric increase argatroban  at 0.30 mcg/kg/min  Check aPTT level Q12h while on argatroban  Continue to monitor H&H and platelets  Thank you for allowing pharmacy to be a part of this patient's care.  Shelba Collier, PharmD, BCPS Clinical Pharmacist

## 2024-01-07 NOTE — Plan of Care (Signed)

## 2024-01-07 NOTE — Progress Notes (Signed)
 Occupational Therapy Treatment Patient Details Name: Mark [name] MRN: [id number] DOB: 1945/07/18 Today's Date: 01/07/2024   History of present illness 78 y.o. male presents to [facility] on 01/04/2024 after an episode of syncope. 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 PMH includes CVA, ITP, lupus, neurogenic bladder, seizure disorder.   OT comments  Patient seen in order to advance with ADL independence. Patient had recently ambulated in the hallway with nursing, therefore completing grooming tasks and incremental scoots towards HOB. Patient remains at a min A level, with cues and education provided for hemi-techniques and increasing overall independence. OT recommendation remains approrpiate, will continue to follow.       If plan is discharge home, recommend the following:  A little help with walking and/or transfers;A little help with bathing/dressing/bathroom;Help with stairs or ramp for entrance   Equipment Recommendations  None recommended by OT    Recommendations for Other Services      Precautions / Restrictions Precautions Precautions: Fall Recall of Precautions/Restrictions: Intact Restrictions Weight Bearing Restrictions Per Provider Order: No       Mobility Bed Mobility Overal bed mobility: Needs Assistance Bed Mobility: Supine to Sit, Sit to Supine     Supine to sit: HOB elevated, Contact guard Sit to supine: Contact guard assist   General bed mobility comments: CGA to complete, increased time    Transfers Overall transfer level: Needs assistance                 General transfer comment: declining as he has just been OOB, able to complete incremental scoots to [facility] to work on strength and activity tolerance     Balance Overall balance assessment: Needs assistance Sitting-balance support: No upper extremity supported, Feet supported Sitting balance-Leahy Scale: Fair     Standing balance support: Single  extremity supported, Reliant on assistive device for balance Standing balance-Leahy Scale: Poor                             ADL either performed or assessed with clinical judgement   ADL Overall ADL's : Needs assistance/impaired     Grooming: Wash/dry hands;Wash/dry face;Set up;Sitting;Applying deodorant   Upper Body Bathing: Contact guard assist;Sitting       Upper Body Dressing : Minimal assistance;Sitting Upper Body Dressing Details (indicate cue type and reason): for hemi techniques and around PICC line     Toilet Transfer: Minimal assistance;Ambulation           Functional mobility during ADLs: Minimal assistance General ADL Comments: Patient seen in order to advance with ADL independence. Patient had recently ambulated in the hallway with nursing, therefore completing grooming tasks and incremental scoots towards HOB. Patient remains at a min A level, with cues and education provided for hemi-techniques and increasing overall independence. OT recommendation remains approrpiate, will continue to follow.    Extremity/Trunk Assessment         Cervical / Trunk Assessment Cervical / Trunk Assessment: Kyphotic    Vision       Perception     Praxis     Communication Communication Communication: Impaired Factors Affecting Communication: Hearing impaired   Cognition Arousal: Alert Behavior During Therapy: WFL for tasks assessed/performed               OT - Cognition Comments: STM deficits                 Following  commands: Intact        Cueing   Cueing Techniques: Verbal cues  Exercises      Shoulder Instructions       General Comments      Pertinent Vitals/ Pain          Home Living Family/patient expects to be discharged to:: Private residence Living Arrangements: Spouse/significant other Available Help at Discharge: Family;Available 24 hours/day Type of Home: House Home Access: Ramped entrance     Home Layout: One  level     Bathroom Shower/Tub: Chief Strategy Officer: Handicapped height Bathroom Accessibility: Yes   Home Equipment: Shower seat;BSC/3in1;Cane - single point;Grab bars - toilet;Hand held shower head;Grab bars - tub/shower;Adaptive equipment;Wheelchair - Geophysical data processor;Other (comment) (hemiwalker) Adaptive Equipment: Long-handled shoe horn Additional Comments: pt reports being ambulatory for household distances using hemi walker. Pt has L PFRW, WC, shower chair/bench, bedside commode. Pt reports being independent with cathing at home prior to admission.      Prior Functioning/Environment              Frequency  Min 2X/week        Progress Toward Goals  OT Goals(current goals can now be found in the care plan section)  Progress towards OT goals: Progressing toward goals  Acute Rehab OT Goals Patient Stated Goal: to get better OT Goal Formulation: With patient/family Time For Goal Achievement: 01/19/24 Potential to Achieve Goals: Good  Plan      Co-evaluation                 AM-PAC OT 6 Clicks Daily Activity     Outcome Measure   Help from another person eating meals?: A Little Help from another person taking care of personal grooming?: A Little Help from another person toileting, which includes using toliet, bedpan, or urinal?: A Little Help from another person bathing (including washing, rinsing, drying)?: A Little Help from another person to put on and taking off regular upper body clothing?: A Little Help from another person to put on and taking off regular lower body clothing?: A Little 6 Click Score: 18    End of Session    OT Visit Diagnosis: Unsteadiness on feet (R26.81);Other abnormalities of gait and mobility (R26.89);Muscle weakness (generalized) (M62.81);History of falling (Z91.81)   Activity Tolerance Patient tolerated treatment well   Patient Left in bed;with family/visitor present;with bed alarm set   Nurse  Communication Mobility status        Time: 8872-8856 OT Time Calculation (min): 16 min  Charges: OT General Charges $OT Visit: 1 Visit OT Treatments $Self Care/Home Management : 8-22 mins  Ronal Gift E. Cortny Bambach, OTR/L Acute Rehabilitation Services [phone]   Ronal Gift Salt 01/07/2024, 12:23 PM

## 2024-01-07 NOTE — Care Management Important Message (Signed)
 Important Message  Patient Details  Name: Mark [name] MRN: [id number] Date of Birth: Aug 04, 1945   Important Message Given:  Yes - Medicare IM     [name] 01/07/2024, 1:56 PM

## 2024-01-07 NOTE — Progress Notes (Signed)
 PHARMACY - ANTICOAGULATION CONSULT NOTE  Pharmacy Consult for argatroban  Indication: hypercoagulable state (lupus anticoagulant, h/o DVT, now with infrarenal aortic thrombus)  Allergies  Allergen Reactions   Benadryl  [Diphenhydramine ] Other (See Comments)    Affects kidneys   Pepcid  [Famotidine ] Other (See Comments)    Affects kidneys   Beef (Diagnostic) Other (See Comments)    Vegetarian   Chicken Meat (Diagnostic) Other (See Comments)    Vegetarian    Food Other (See Comments)    Animal meat product - pt is vegetarian   Pork (Diagnostic) Other (See Comments)    Vegetarian     Patient Measurements: Height: 5' 10 (177.8 cm) Weight: 82.1 kg (181 lb) IBW/kg (Calculated) : 73 HEPARIN  DW (KG): 82.1  Vital Signs: Temp: 98 F (36.7 C) (09/24 1942) Temp Source: Oral (09/24 1942) BP: 126/57 (09/24 1942) Pulse Rate: 62 (09/24 1942)  Labs: Recent Labs    01/05/24 0625 01/05/24 0625 01/05/24 2008 01/06/24 0506 01/06/24 0840 01/07/24 0149 01/07/24 1430 01/07/24 2234  HGB 10.6*  --  9.4* 9.5*  --  8.4*  --   --   HCT 34.0*  --  29.5* 30.1*  --  27.3*  --   --   PLT 43*  --  54* 69*  --  90*  --   --   APTT  --    < > 52* 79*   < > 53* 56* 48*  CREATININE 1.68*  --   --  1.55*  --  1.27*  --   --    < > = values in this interval not displayed.    Estimated Creatinine Clearance: 49.5 mL/min (A) (by C-G formula based on SCr of 1.27 mg/dL (H)).   Assessment: 78 y.o. M presents with syncopal episode. Pt on Xarelto  pta for h/o DVT (lupus anticoagulant). 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 Hem/onc (Dr. Timmy) recommends argatroban  for now. Baseline aPTT 52 seconds.  aPTT is slightly subtherapeutic at 48 seconds. No bleeding concerns or issues with the infusion running continuously per RN.   Goal of Therapy:  aPTT 50-90 sec Monitor platelets by anticoagulation protocol: Yes    Plan:   Increase argatroban  at 0.36 mcg/kg/min   aPTT in 4 hours  Check aPTT level Q12h while on argatroban  Continue to monitor H&H and platelets  Thank you for allowing pharmacy to be a part of this patient's care.  Lynwood Poplar, PharmD, BCPS Clinical Pharmacist 01/07/2024 11:03 PM

## 2024-01-07 NOTE — Progress Notes (Signed)
 He continues to feel better.  He now has a PICC line in the right arm.  He is happy about this.  He continues on argatroban .  I will continue him on this for right now.  There is still no urine culture result out.  I had believe this is going to be E. coli as he has had this before.  He does self catheterizations at home.  I was wondering if there may not be some kind of low-grade antibiotic suppressive that can be used.  His platelet count is coming up nicely.  His CBC showed a white count of 15.3.  Hemoglobin 8.4.  Platelet count 90,000.  His hemoglobin is dropping a little bit.  We will have to watch this.  Again he is on the argatroban .  There has been no obvious bleeding that we can see.  His BUN is 32 creatinine 1.27.  Calcium  8.7 with an albumin of 3.0.  His appetite is doing quite well.  He is on the high dose Decadron .  He also received Nplate .  I do not think we have to go with IVIG given that his platelet count is coming up nicely.  Again, I will continue him on the argatroban  for right now.  I really think he needs to have some IV anticoagulation to try to help with this aortic thrombus.  He has had no chest pain.  He has been no cough.  He has had no shortness of breath.  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   His vital signs are stable.  Temperature 97.4.  Pulse 67.  Blood pressure 105/62.  His lungs are clear bilaterally.  He has good air movement by laterally.  No wheezes are noted.  Cardiac exam regular rate and rhythm.  Abdomen is soft.  Bowel sounds are present.  He has no fluid wave.  There is no palpable liver or spleen tip.  Extremity shows a paresis of the left side.  He has good movement and strength of his right side.  He comes in with a UTI in all likelihood.  Again I suspect this is going to be E. coli since he has had this before and he does the catheterizations.  He has a thrombocytopenia which I suspect is probably more so from the  UTI.  The aortic thrombus is a new finding.  He has been on anticoagulation long-term.  We may have to make a switch.  I think that Pradaxa  would be a reasonable option for him.  This is a direct thrombin inhibitor.  He has the anemia.  Will have to follow this for right now.  I know he is getting great care from everybody upon 2 W.   Jeralyn Crease, MD  Psalm 62:6

## 2024-01-07 NOTE — Hospital Course (Addendum)
 Mark [name] is a 78 y.o. male with medical history significant for CVA with residual left-sided deficits, ITP, lupus anticoagulant (on Xarelto  at home), neurogenic bladder requiring intermittent catheterization, seizure disorder, who presented with an episode of syncope while sitting down eating with his wife.  CT angio chest revealed no evidence of pulmonary embolism however showed multifocal infiltrates in the left upper and lower lobes.  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  Urinalysis was positive for pyuria.  Urine culture and blood cultures were sent, patient was given broad-spectrum antibiotic and was admitted hospital for further evaluation and treatment.   Assessment and Plan:    Left lobar pneumonia Concern for aspiration pneumonia Possible aspiration pneumonia given history of episode of vomiting concurrent with loss of consciousness. He had no significant respiratory symptoms and maintained appropriate SpO2 on RA throughout his hospitalization. Repeat CXR showed no acute findings. The patient completed a 7-day course of cefepime  and flagyl .    Syncope Etiology unclear, possibly secondary to vasovagal reflex given the immediate prior sensation of food stuck in the esophagus. CT head showed an old right MCA infarct.  EEG showed cortical dysfunction from the right temporal region likely secondary to underlying structural abnormality.  No seizures were reported. Echo (01/05/2024) showed LVEF 55 to 60%, without significant valvular abnormalities.    Infrarenal aortic thrombus Vascular surgery saw the patient and recommended outpatient follow-up with bilateral lower extremity duplexes. Heme/Onc was also consulted and started the patient on argatroban  drip due to his concomitant thrombocytopenia which was ultimately  switched to Pradaxa . The patient was instructed to discontinue Xarelto  and start Pradaxa  on discharge.     Idiopathic thrombocytopenic purpura Thrombocytopenia Platelets on admission were 42K. Heme/Onc was consulted. The patient was treated with Decadron  with resolution of thrombocytopenia. Platelets counts recovered to 309K on the day of discharge.  Abnormal LFTs On the day prior to discharge, the patient's LFTs, which were initially normal, were noted to be elevated with an AST of 45 and an ALT of 64. Acute hepatitis panel was negative. AST improved to 40 the following day with ALT remaining elevated at 65. The patient was instructed to follow up with his PCP within a week to repeat LFTs.   IDA Received IV iron on 9/25  Esophageal dysmotility Patient reports history of intermittent difficulty swallowing with episode of significant food getting stuck in esophagus prior to admission associated with syncopal episode. Esophagram completed here showed tiny hiatal hernia, mild esophageal dysmotility and likely presbyesophagus, as well as delayed passage of 13 mm barium tablet which became stuck near the GE junction likely due to dysmotility. Will refer patient to GI in the outpatient setting for further evaluation.  Acute cystitis  Initial concern for acute cystitis with UA showed cloudy urine positive leukocytes but negative nitrites. However, urine culture only showed <10,000 E. coli. He denied any urinary symptoms. Has known history of previous UTIs given neurogenic bladder with intermittent self catheterization. Regardless, he was covered with cefepime  for 7 days.  Stage I pressure injury noted over the right lateral back.   Wound 01/06/24 1212 Pressure Injury Back Lateral;Right;Upper Stage 1 -  Intact skin with non-blanchable redness of a localized area usually over a bony prominence. (Active)

## 2024-01-07 NOTE — Plan of Care (Signed)

## 2024-01-08 ENCOUNTER — Inpatient Hospital Stay (HOSPITAL_COMMUNITY)

## 2024-01-08 ENCOUNTER — Encounter (HOSPITAL_COMMUNITY): Payer: Self-pay | Admitting: Internal Medicine

## 2024-01-08 DIAGNOSIS — D509 Iron deficiency anemia, unspecified: Secondary | ICD-10-CM | POA: Insufficient documentation

## 2024-01-08 DIAGNOSIS — L8990 Pressure ulcer of unspecified site, unspecified stage: Secondary | ICD-10-CM | POA: Insufficient documentation

## 2024-01-08 DIAGNOSIS — I7410 Embolism and thrombosis of unspecified parts of aorta: Secondary | ICD-10-CM | POA: Diagnosis not present

## 2024-01-08 LAB — CBC WITH DIFFERENTIAL/PLATELET
Abs Immature Granulocytes: 0.21 K/uL — ABNORMAL HIGH (ref 0.00–0.07)
Basophils Absolute: 0 K/uL (ref 0.0–0.1)
Basophils Relative: 0 %
Eosinophils Absolute: 0 K/uL (ref 0.0–0.5)
Eosinophils Relative: 0 %
HCT: 27.8 % — ABNORMAL LOW (ref 39.0–52.0)
Hemoglobin: 8.7 g/dL — ABNORMAL LOW (ref 13.0–17.0)
Immature Granulocytes: 1 %
Lymphocytes Relative: 11 %
Lymphs Abs: 2.1 K/uL (ref 0.7–4.0)
MCH: 26 pg (ref 26.0–34.0)
MCHC: 31.3 g/dL (ref 30.0–36.0)
MCV: 83.2 fL (ref 80.0–100.0)
Monocytes Absolute: 2.8 K/uL — ABNORMAL HIGH (ref 0.1–1.0)
Monocytes Relative: 14 %
Neutro Abs: 14.4 K/uL — ABNORMAL HIGH (ref 1.7–7.7)
Neutrophils Relative %: 74 %
Platelets: 170 K/uL (ref 150–400)
RBC: 3.34 MIL/uL — ABNORMAL LOW (ref 4.22–5.81)
RDW: 19.9 % — ABNORMAL HIGH (ref 11.5–15.5)
WBC: 19.6 K/uL — ABNORMAL HIGH (ref 4.0–10.5)
nRBC: 0.1 % (ref 0.0–0.2)

## 2024-01-08 LAB — COMPREHENSIVE METABOLIC PANEL WITH GFR
ALT: 26 U/L (ref 0–44)
AST: 25 U/L (ref 15–41)
Albumin: 2.9 g/dL — ABNORMAL LOW (ref 3.5–5.0)
Alkaline Phosphatase: 54 U/L (ref 38–126)
Anion gap: 8 (ref 5–15)
BUN: 34 mg/dL — ABNORMAL HIGH (ref 8–23)
CO2: 20 mmol/L — ABNORMAL LOW (ref 22–32)
Calcium: 8.7 mg/dL — ABNORMAL LOW (ref 8.9–10.3)
Chloride: 112 mmol/L — ABNORMAL HIGH (ref 98–111)
Creatinine, Ser: 1.27 mg/dL — ABNORMAL HIGH (ref 0.61–1.24)
GFR, Estimated: 58 mL/min — ABNORMAL LOW (ref >60)
Glucose, Bld: 156 mg/dL — ABNORMAL HIGH (ref 70–99)
Potassium: 3.9 mmol/L (ref 3.5–5.1)
Sodium: 140 mmol/L (ref 135–145)
Total Bilirubin: 0.5 mg/dL (ref 0.0–1.2)
Total Protein: 5.7 g/dL — ABNORMAL LOW (ref 6.5–8.1)

## 2024-01-08 LAB — APTT: aPTT: 54 s — ABNORMAL HIGH (ref 24–36)

## 2024-01-08 MED ORDER — METRONIDAZOLE 500 MG PO TABS
500.0000 mg | ORAL_TABLET | Freq: Two times a day (BID) | ORAL | Status: DC
  Administered 2024-01-08 – 2024-01-10 (×5): 500 mg via ORAL
  Filled 2024-01-08 (×5): qty 1

## 2024-01-08 MED ORDER — DABIGATRAN ETEXILATE MESYLATE 150 MG PO CAPS
150.0000 mg | ORAL_CAPSULE | Freq: Two times a day (BID) | ORAL | Status: DC
  Administered 2024-01-08 – 2024-01-10 (×5): 150 mg via ORAL
  Filled 2024-01-08 (×6): qty 1

## 2024-01-08 MED ORDER — SODIUM CHLORIDE 0.9 % IV SOLN
250.0000 mg | Freq: Every day | INTRAVENOUS | Status: AC
  Administered 2024-01-08 – 2024-01-09 (×2): 250 mg via INTRAVENOUS
  Filled 2024-01-08 (×3): qty 20

## 2024-01-08 NOTE — Progress Notes (Signed)
 Progress Note   Patient: Mark [name] FMW:[id number] DOB: 08-27-1945 DOA: 01/04/2024     4 DOS: the patient was seen and examined on 01/08/2024   Brief hospital course: Mark [name] is a 78 y.o. male with medical history significant for CVA with residual left-sided deficits, ITP, lupus anticoagulant (on Xarelto  at home), neurogenic bladder requiring intermittent catheterization, seizure disorder, teflaro for stenting or syncope while sitting down eating with his wife.  CT angio chest revealed no evidence of pulmonary embolism however showed multifocal infiltrates in the left upper and lower lobes.  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  Urinalysis was positive for pyuria.  Urine culture and blood cultures were sent, patient was given broad-spectrum antibiotic and was admitted hospital for further evaluation and treatment.   Assessment and Plan:    Left lobar pneumonia Concern for aspiration pneumonia Possible aspiration pneumonia given history of episode of vomiting concurrent with loss of consciousness. No significant respiratory symptoms, maintains appropriate SpO2 on RA. - Afebrile, worsening leukocytosis to 19.6, unclear if infectious or secondary to decadron  - CXR today showed no acute findings. - Continue cefepime  and flagyl    Syncope of unclear etiology Etiology unclear, possibly secondary to vasovagal reflex given the immediate prior sensation of food stuck in the esophagus. CT head showing  old right MCA infarct.  EEG showed cortical dysfunction from the right temporal region likely secondary to underlying structural abnormality.  No seizures were reported.  Will need orthostatic blood pressure evaluation.   Infrarenal aortic thrombus -  Vascular surgery saw the patient and recommended outpatient  follow-up with bilateral lower extremity duplexes. - Heme/Onc following - Argatroban  drip discontinued. - Pradaxa  started    Idiopathic thrombocytopenic purpura Heme/Onc following.  PLTs improved to 170 - Continue Decadron  40 mg daily  IDA Heme/Onc following - Received IV iron today  Acute cystitis ruled out Initial concern for acute cystitis with UA showed cloudy urine positive leukocytes but negative nitrites. However, urine culture negative.  Hyperlipidemia Continue Crestor     H/o CVA with left sided residual deficits History Referral seen the patient and recommend home health PT on discharge  Stage I pressure injury noted over the right lateral back.  Continue prevention protocol. Wound 01/06/24 1212 Pressure Injury Back Lateral;Right;Upper Stage 1 -  Intact skin with non-blanchable redness of a localized area usually over a bony prominence. (Active)            Subjective: Patient was seen sitting in bedside chair.  Had no acute concerns today.  Denies any chest pain, shortness of breath, abdominal pain, urinary symptoms.  Does report 1 episode of vomiting overnight.  Has been able to tolerate p.o. intake without issue otherwise.  Physical Exam: Vitals:   01/08/24 0400 01/08/24 0748 01/08/24 1138 01/08/24 1559  BP: 125/60 (!) 155/96 137/84 (!) 156/84  Pulse: 63 69 65 63  Resp: 18 19 17 17   Temp: (!) 97.5 F (36.4 C) 97.8 F (36.6 C) 97.6 F (36.4 C) 97.6 F (36.4 C)  TempSrc:  Oral Oral   SpO2: 100% 96% 97% 98%  Weight:      Height:       Physical Exam Constitutional:      General: He is not in acute distress.    Appearance: He is not ill-appearing.  HENT:     Mouth/Throat:  Mouth: Mucous membranes are moist.  Cardiovascular:     Rate and Rhythm: Normal rate and regular rhythm.     Heart sounds: No murmur heard. Pulmonary:     Effort: Pulmonary effort is normal. No respiratory distress.     Breath sounds: Normal breath sounds. No wheezing.   Abdominal:     General: There is no distension.     Palpations: Abdomen is soft.     Tenderness: There is no abdominal tenderness.  Musculoskeletal:     Comments: Right upper extremity PICC line in place.  Left hemiplegia.  Skin:    General: Skin is warm.     Capillary Refill: Capillary refill takes less than 2 seconds.  Neurological:     Mental Status: He is alert.     Family Communication: None  Disposition: Status is: Inpatient   Planned Discharge Destination: Home with Home Health    Time spent: 40 minutes  Author: Duffy Larch, MD 01/08/2024 7:11 PM  For on call review [email or web address].

## 2024-01-08 NOTE — Progress Notes (Signed)
 PHARMACY - ANTICOAGULATION CONSULT NOTE  Pharmacy Consult for argatroban  >>dabigatran  Indication: hypercoagulable state (lupus anticoagulant, h/o DVT, now with infrarenal aortic thrombus)  Allergies  Allergen Reactions   Benadryl  [Diphenhydramine ] Other (See Comments)    Affects kidneys   Pepcid  [Famotidine ] Other (See Comments)    Affects kidneys   Beef (Diagnostic) Other (See Comments)    Vegetarian   Chicken Meat (Diagnostic) Other (See Comments)    Vegetarian    Food Other (See Comments)    Animal meat product - pt is vegetarian   Pork (Diagnostic) Other (See Comments)    Vegetarian     Patient Measurements: Height: 5' 10 (177.8 cm) Weight: 82.1 kg (181 lb) IBW/kg (Calculated) : 73 HEPARIN  DW (KG): 82.1  Vital Signs: Temp: 97.5 F (36.4 C) (09/25 0400) Temp Source: Oral (09/24 2300) BP: 125/60 (09/25 0400) Pulse Rate: 63 (09/25 0400)  Labs: Recent Labs    01/06/24 0506 01/06/24 0840 01/07/24 0149 01/07/24 1430 01/07/24 2234 01/08/24 0431  HGB 9.5*  --  8.4*  --   --  8.7*  HCT 30.1*  --  27.3*  --   --  27.8*  PLT 69*  --  90*  --   --  170  APTT 79*   < > 53* 56* 48* 54*  CREATININE 1.55*  --  1.27*  --   --  1.27*   < > = values in this interval not displayed.    Estimated Creatinine Clearance: 49.5 mL/min (A) (by C-G formula based on SCr of 1.27 mg/dL (H)).   Assessment: 78 y.o. M presents with syncopal episode. Pt on Xarelto  pta for h/o DVT (lupus anticoagulant). 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 Hem/onc (Dr. Timmy) recommends change from argatroban  today (Day 4) to dabigatran .  Hgb 8.7, PLTC 170    Goal of Therapy:  aPTT 50-90 sec Monitor platelets by anticoagulation protocol: Yes   Plan:  Stop argatroban  at time of first dabigatran  Dabigatran  150mg  PO BID Will contact Rx patient advocate team for copay information Will need to educate  patient prior to discharge F/u CBC   01/08/2024 7:04 AM

## 2024-01-08 NOTE — Progress Notes (Signed)
 Physical Therapy Treatment Patient Details Name: Mark [name] MRN: [id number] DOB: 12-07-1945 Today's Date: 01/08/2024   History of Present Illness 78 y.o. male presents to [facility] hospital on 01/04/2024 after an episode of syncope. 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 PMH includes CVA, ITP, lupus, neurogenic bladder, seizure disorder.    PT Comments  Patient progressing with mobility able to ambulate in hallway increased distance with hemiwalker and supervision.  Stable BP measurements throughout and no c/o dizziness.  Patient hopeful for progressing gait eventually to cane.  Feel continued skilled PT in the acute setting and possible transition to outpatient PT at d/c.     If plan is discharge home, recommend the following: A little help with walking and/or transfers;A little help with bathing/dressing/bathroom;Assistance with cooking/housework;Assist for transportation;Help with stairs or ramp for entrance   Can travel by private vehicle        Equipment Recommendations  None recommended by PT    Recommendations for Other Services       Precautions / Restrictions Precautions Precautions: Fall Recall of Precautions/Restrictions: Intact Precaution/Restrictions Comments: watch BP     Mobility  Bed Mobility Overal bed mobility: Needs Assistance Bed Mobility: Supine to Sit     Supine to sit: Min assist     General bed mobility comments: help for trunk upright    Transfers Overall transfer level: Needs assistance Equipment used: Hemi-walker Transfers: Sit to/from Stand Sit to Stand: Supervision           General transfer comment: increased time, SBA for safety    Ambulation/Gait Ambulation/Gait assistance: Supervision Gait Distance (Feet): 150 Feet Assistive device: Hemi-walker Gait Pattern/deviations: Step-to pattern, Decreased dorsiflexion - left, Decreased weight shift to left, Wide base of support       General Gait  Details: keeps HW in front and step to with consistent step length and good L foot clearance despite hemiparesis and not wearing shoe/brace   Stairs             Wheelchair Mobility     Tilt Bed    Modified Rankin (Stroke Patients Only)       Balance Overall balance assessment: Needs assistance   Sitting balance-Leahy Scale: Good     Standing balance support: Single extremity supported Standing balance-Leahy Scale: Poor                              Communication Communication Communication: Impaired Factors Affecting Communication: Hearing impaired  Cognition Arousal: Alert Behavior During Therapy: WFL for tasks assessed/performed   PT - Cognitive impairments: No apparent impairments                         Following commands: Intact      Cueing Cueing Techniques: Verbal cues  Exercises      General Comments General comments (skin integrity, edema, etc.): BP stable as follows: supine 141/77 HR 70; seated 128/66 HR 72; standing 124/83 HR 76      Pertinent Vitals/Pain Pain Assessment Pain Assessment: Faces Faces Pain Scale: Hurts little more Pain Location: back from bed positioning Pain Descriptors / Indicators: Discomfort Pain Intervention(s): Monitored during session, Repositioned    Home Living                          Prior Function  PT Goals (current goals can now be found in the care plan section) Progress towards PT goals: Progressing toward goals    Frequency    Min 2X/week      PT Plan      Co-evaluation              AM-PAC PT 6 Clicks Mobility   Outcome Measure  Help needed turning from your back to your side while in a flat bed without using bedrails?: A Little Help needed moving from lying on your back to sitting on the side of a flat bed without using bedrails?: A Little Help needed moving to and from a bed to a chair (including a wheelchair)?: A Little Help needed  standing up from a chair using your arms (e.g., wheelchair or bedside chair)?: A Little Help needed to walk in hospital room?: A Little Help needed climbing 3-5 steps with a railing? : A Lot 6 Click Score: 17    End of Session Equipment Utilized During Treatment: Gait belt Activity Tolerance: Patient tolerated treatment well Patient left: in chair;with call bell/phone within reach;with chair alarm set   PT Visit Diagnosis: Other abnormalities of gait and mobility (R26.89);Muscle weakness (generalized) (M62.81)     Time: 1020-1055 PT Time Calculation (min) (ACUTE ONLY): 35 min  Charges:    $Gait Training: 23-37 mins PT General Charges $$ ACUTE PT VISIT: 1 Visit                     Micheline [name], PT Acute Rehabilitation Services Office:[phone] 01/08/2024    Mark [name] 01/08/2024, 5:13 PM

## 2024-01-08 NOTE — Plan of Care (Signed)
  Problem: Health Behavior/Discharge Planning: Goal: Ability to manage health-related needs will improve Outcome: Not Progressing   Problem: Pain Managment: Goal: General experience of comfort will improve and/or be controlled Outcome: Progressing   Problem: Activity: Goal: Risk for activity intolerance will decrease Outcome: Progressing   Problem: Education: Goal: Knowledge of General Education information will improve Description: Including pain rating scale, medication(s)/side effects and non-pharmacologic comfort measures Outcome: Progressing

## 2024-01-08 NOTE — Progress Notes (Addendum)
 Mark [name] may have a little bit of disorientation this morning.  He said he had his last night.  He is coughing quite a bit.  It may not be a bad idea to get a chest x-ray to see what might be going on.  Hopefully he does not have pneumonia.  The urine culture, I do not think, we will grow out any organism.  His labs show BUN 37 creatinine 1.27.  His blood sugar is 156.  His calcium  is 8.7 with an albumin of 2.9.  There was blood counts come up quite nicely.  The platelet count was 170,000 now.  Hemoglobin 8.7.  White cell count 19.6.  He is on the argatroban .  He does have some iron deficiency.  I am going give him some IV iron.  I do not think that any fever.  I think is eating okay.  He has had a little bit of vomiting last night.  Vital signs show temperature of 97 5.  Pulse 63.  Blood pressure 125/60.  His lungs sound clear bilaterally.  He has good air movement bilaterally.  I do not hear any wheezing.  Cardiac exam regular rate and rhythm.  He has no murmurs.  Abdomen is soft.  Bowel sounds are present.  He has no fluid wave.  There is no palpable liver or spleen tip.  Extremity shows no clubbing, cyanosis or edema.  He has a paralysis on the left side.  Again, I am not sure if anything else is going on with him.  I doubt that the urine will grow anything.  I do think a chest x-ray would not be a bad idea for him.  His platelet counts come up quite nicely.  We can think about switching him over to Pradaxa .  I will make sure he gets some IV iron.  I think this will be important.  Jeralyn Crease, MD  Ex[place] 15:26

## 2024-01-08 NOTE — Plan of Care (Signed)

## 2024-01-09 ENCOUNTER — Other Ambulatory Visit (HOSPITAL_COMMUNITY): Payer: Self-pay

## 2024-01-09 ENCOUNTER — Encounter: Payer: Self-pay | Admitting: Hematology & Oncology

## 2024-01-09 DIAGNOSIS — I7410 Embolism and thrombosis of unspecified parts of aorta: Secondary | ICD-10-CM | POA: Diagnosis not present

## 2024-01-09 LAB — CBC WITH DIFFERENTIAL/PLATELET
Abs Immature Granulocytes: 0.61 K/uL — ABNORMAL HIGH (ref 0.00–0.07)
Basophils Absolute: 0.1 K/uL (ref 0.0–0.1)
Basophils Relative: 0 %
Eosinophils Absolute: 0 K/uL (ref 0.0–0.5)
Eosinophils Relative: 0 %
HCT: 29.8 % — ABNORMAL LOW (ref 39.0–52.0)
Hemoglobin: 9.4 g/dL — ABNORMAL LOW (ref 13.0–17.0)
Immature Granulocytes: 3 %
Lymphocytes Relative: 11 %
Lymphs Abs: 2.1 K/uL (ref 0.7–4.0)
MCH: 26 pg (ref 26.0–34.0)
MCHC: 31.5 g/dL (ref 30.0–36.0)
MCV: 82.5 fL (ref 80.0–100.0)
Monocytes Absolute: 2.9 K/uL — ABNORMAL HIGH (ref 0.1–1.0)
Monocytes Relative: 16 %
Neutro Abs: 12.9 K/uL — ABNORMAL HIGH (ref 1.7–7.7)
Neutrophils Relative %: 70 %
Platelets: 231 K/uL (ref 150–400)
RBC: 3.61 MIL/uL — ABNORMAL LOW (ref 4.22–5.81)
RDW: 19.7 % — ABNORMAL HIGH (ref 11.5–15.5)
WBC: 18.5 K/uL — ABNORMAL HIGH (ref 4.0–10.5)
nRBC: 0.2 % (ref 0.0–0.2)

## 2024-01-09 LAB — COMPREHENSIVE METABOLIC PANEL WITH GFR
ALT: 64 U/L — ABNORMAL HIGH (ref 0–44)
AST: 45 U/L — ABNORMAL HIGH (ref 15–41)
Albumin: 3.2 g/dL — ABNORMAL LOW (ref 3.5–5.0)
Alkaline Phosphatase: 54 U/L (ref 38–126)
Anion gap: 9 (ref 5–15)
BUN: 38 mg/dL — ABNORMAL HIGH (ref 8–23)
CO2: 22 mmol/L (ref 22–32)
Calcium: 8.8 mg/dL — ABNORMAL LOW (ref 8.9–10.3)
Chloride: 107 mmol/L (ref 98–111)
Creatinine, Ser: 1.22 mg/dL (ref 0.61–1.24)
GFR, Estimated: >60 mL/min (ref >60)
Glucose, Bld: 124 mg/dL — ABNORMAL HIGH (ref 70–99)
Potassium: 3.8 mmol/L (ref 3.5–5.1)
Sodium: 138 mmol/L (ref 135–145)
Total Bilirubin: 0.7 mg/dL (ref 0.0–1.2)
Total Protein: 6 g/dL — ABNORMAL LOW (ref 6.5–8.1)

## 2024-01-09 LAB — HEPATITIS PANEL, ACUTE
HCV Ab: NONREACTIVE
Hep A IgM: NONREACTIVE
Hep B C IgM: NONREACTIVE
Hepatitis B Surface Ag: NONREACTIVE

## 2024-01-09 LAB — CULTURE, BLOOD (ROUTINE X 2)
Culture: NO GROWTH
Culture: NO GROWTH

## 2024-01-09 MED ORDER — DABIGATRAN ETEXILATE MESYLATE 150 MG PO CAPS
150.0000 mg | ORAL_CAPSULE | Freq: Two times a day (BID) | ORAL | 0 refills | Status: DC
  Filled 2024-01-09: qty 60, 30d supply, fill #0

## 2024-01-09 NOTE — Plan of Care (Signed)
  Problem: Education: Goal: Knowledge of General Education information will improve Description: Including pain rating scale, medication(s)/side effects and non-pharmacologic comfort measures Outcome: Progressing   Problem: Health Behavior/Discharge Planning: Goal: Ability to manage health-related needs will improve Outcome: Progressing   Problem: Clinical Measurements: Goal: Ability to maintain clinical measurements within normal limits will improve Outcome: Progressing Goal: Will remain free from infection Outcome: Progressing Goal: Diagnostic test results will improve Outcome: Progressing Goal: Respiratory complications will improve Outcome: Progressing Goal: Cardiovascular complication will be avoided Outcome: Progressing   Problem: Coping: Goal: Level of anxiety will decrease Outcome: Progressing   Problem: Nutrition: Goal: Adequate nutrition will be maintained Outcome: Progressing   Problem: Pain Managment: Goal: General experience of comfort will improve and/or be controlled Outcome: Progressing   Problem: Skin Integrity: Goal: Risk for impaired skin integrity will decrease Outcome: Progressing

## 2024-01-09 NOTE — Discharge Summary (Signed)
 Physician Discharge Summary   Patient: Mark [name] MRN: [id number] DOB: 01-Nov-1945  Admit date:     01/04/2024  Discharge date: 01/10/24  Discharge Physician: Duffy Al-Sultani   PCP: Teresa Channel, MD   Recommendations at discharge:   1.  We have made the following medication changes during your hospital admission:      -- Stop Xarelto       -- Start Pradaxa   2.  Please follow-up with your PCP in for repeat CMP to evaluate liver function  3.  Please follow-up with your hematology/oncology provider  4.  Please follow-up with gastroenterology for further evaluation of your esophageal motility concerns   Discharge Diagnoses: Principal Problem:   Sepsis (HCC) Active Problems:   Idiopathic thrombocytopenic purpura (HCC)   Lupus anticoagulant disorder   Neurogenic bladder   Thrombocytopenia   Aortic thrombus (HCC)   Iron deficiency anemia   Pressure injury of skin   Aspiration pneumonia (HCC)   Esophageal dysmotility   Syncope, vasovagal   Acute cystitis  Resolved Problems:   * No resolved hospital problems. *  Hospital Course: Mark [name] is a 78 y.o. male with medical history significant for CVA with residual left-sided deficits, ITP, lupus anticoagulant (on Xarelto  at home), neurogenic bladder requiring intermittent catheterization, seizure disorder, who presented with an episode of syncope while sitting down eating with his wife.  CT angio chest revealed no evidence of pulmonary embolism however showed multifocal infiltrates in the left upper and lower lobes.  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  Urinalysis was positive for pyuria.  Urine culture and blood cultures were sent, patient was given broad-spectrum antibiotic and was admitted hospital for further evaluation and treatment.    Assessment and Plan:    Left lobar pneumonia Concern for aspiration pneumonia Possible aspiration pneumonia given history of episode of vomiting concurrent with loss of consciousness. He had no significant respiratory symptoms and maintained appropriate SpO2 on RA throughout his hospitalization. Repeat CXR showed no acute findings. The patient completed a 7-day course of cefepime  and flagyl .    Syncope Etiology unclear, possibly secondary to vasovagal reflex given the immediate prior sensation of food stuck in the esophagus. CT head showed an old right MCA infarct.  EEG showed cortical dysfunction from the right temporal region likely secondary to underlying structural abnormality.  No seizures were reported. Echo (01/05/2024) showed LVEF 55 to 60%, without significant valvular abnormalities.    Infrarenal aortic thrombus Vascular surgery saw the patient and recommended outpatient follow-up with bilateral lower extremity duplexes. Heme/Onc was also consulted and started the patient on argatroban  drip due to his concomitant thrombocytopenia which was ultimately switched to Pradaxa . The patient was instructed to discontinue Xarelto  and start Pradaxa  on discharge.     Idiopathic thrombocytopenic purpura Thrombocytopenia Platelets on admission were 42K. Heme/Onc was consulted. The patient was treated with Decadron  with resolution of thrombocytopenia. Platelets counts recovered to 309K on the day of discharge.  Abnormal LFTs On the day prior to discharge, the patient's LFTs, which were initially normal, were noted to be elevated with an AST of 45 and an ALT of 64. Acute hepatitis panel was negative. AST improved to 40 the following day with ALT remaining elevated at 65. The patient was instructed to follow up with his PCP within a week to repeat LFTs.   IDA Received  IV iron on 9/25  Esophageal dysmotility Patient reports history of intermittent difficulty swallowing with episode of significant  food getting stuck in esophagus prior to admission associated with syncopal episode. Esophagram completed here showed tiny hiatal hernia, mild esophageal dysmotility and likely presbyesophagus, as well as delayed passage of 13 mm barium tablet which became stuck near the GE junction likely due to dysmotility. Will refer patient to GI in the outpatient setting for further evaluation.  Acute cystitis  Initial concern for acute cystitis with UA showed cloudy urine positive leukocytes but negative nitrites. However, urine culture only showed <10,000 E. coli. He denied any urinary symptoms. Has known history of previous UTIs given neurogenic bladder with intermittent self catheterization. Regardless, he was covered with cefepime  for 7 days.  Stage I pressure injury noted over the right lateral back.   Wound 01/06/24 1212 Pressure Injury Back Lateral;Right;Upper Stage 1 -  Intact skin with non-blanchable redness of a localized area usually over a bony prominence. (Active)      Assessment and Plan: No notes have been filed under this hospital service. Service: Hospitalist        Consultants: Hematology/oncology, vascular surgery Procedures performed: RUE PICC line  Disposition: Home Diet recommendation:  Discharge Diet Orders (From admission, onward)     Start     Ordered   01/09/24 0000  Diet - low sodium heart healthy        01/09/24 1658   01/09/24 0000  Diet general        01/09/24 1658            DISCHARGE MEDICATION: Allergies as of 01/10/2024       Reactions   Benadryl  [diphenhydramine ] Other (See Comments)   Affects kidneys   Pepcid  [famotidine ] Other (See Comments)   Affects kidneys   Beef (diagnostic) Other (See Comments)   Vegetarian   Chicken Meat (diagnostic) Other (See Comments)   Vegetarian    Food Other (See Comments)   Animal meat product - pt is vegetarian   Pork (diagnostic) Other (See Comments)   Vegetarian         Medication List     STOP  taking these medications    Xarelto  20 MG Tabs tablet Generic drug: rivaroxaban        TAKE these medications    acetaminophen  500 MG tablet Commonly known as: TYLENOL  Take 1,000 mg by mouth every 6 (six) hours as needed for mild pain (pain score 1-3). Patient takes two tablets (1000mg ) at bedtime every night. Patient also takes additional doses as needed and with his Norco 10-325.   alendronate 70 MG tablet Commonly known as: FOSAMAX Take 70 mg by mouth once a week. Take one tablet (70mg ) by mouth once per week on Monday.   amLODipine  10 MG tablet Commonly known as: NORVASC  Take 10 mg by mouth at bedtime.   aspirin  EC 81 MG tablet Take 81 mg by mouth at bedtime. Swallow whole.   baclofen  10 MG tablet Commonly known as: LIORESAL  Take 10 mg by mouth in the morning and at bedtime.   Blue-Emu Hemp 10 % cream Generic drug: trolamine salicylate Apply 1 Application topically as needed for muscle pain (Hand pain).   buPROPion  150 MG 24 hr tablet Commonly known as: WELLBUTRIN  XL Take 150 mg by mouth in the morning.   dabigatran  150 MG Caps capsule Commonly known as: PRADAXA  Take 1 capsule (150 mg total) by mouth every 12 (twelve) hours.   dexamethasone  4 MG tablet Commonly  known as: DECADRON  TAKE 10 TABLETS BY MOUTH ONCE DAILY AS NEEDED FOR  3  DAYS  FOR  ITP   HYDROcodone -acetaminophen  10-325 MG tablet Commonly known as: NORCO Take 1 tablet by mouth 2 (two) times daily as needed for moderate pain (pain score 4-6).   levETIRAcetam  500 MG tablet Commonly known as: KEPPRA  Take 1,500 mg by mouth in the morning and at bedtime.   melatonin 5 MG Tabs Take 1 tablet (5 mg total) by mouth at bedtime as needed. What changed:  how much to take when to take this   omeprazole 40 MG capsule Commonly known as: PRILOSEC Take 40 mg by mouth daily before breakfast.   polyethylene glycol 17 g packet Commonly known as: MIRALAX  / GLYCOLAX  Take 17 g by mouth 2 (two) times  daily. What changed:  when to take this reasons to take this   rosuvastatin  40 MG tablet Commonly known as: CRESTOR  Take 40 mg by mouth at bedtime.   sertraline  100 MG tablet Commonly known as: ZOLOFT  Take 200 mg by mouth in the morning.   Theratears 0.25 % Soln Generic drug: Carboxymethylcellulose Sodium Place 1 drop into both eyes 2 (two) times daily as needed (for redness or irritation).   Vitamin D3 50 MCG (2000 UT) Tabs Take 2,000 Units by mouth at bedtime.        Follow-up Information     Sheree Penne Bruckner, MD. Schedule an appointment as soon as possible for a visit in 6 month(s).   Specialties: Vascular Surgery, Cardiology Contact information: [address] [place] [address] [phone]                Discharge Exam: Fredricka Weights   01/05/24 1646  Weight: 82.1 kg   Physical Exam Constitutional:      General: He is not in acute distress.    Appearance: He is not ill-appearing.  HENT:     Mouth/Throat:     Mouth: Mucous membranes are moist.  Cardiovascular:     Rate and Rhythm: Normal rate and regular rhythm.     Heart sounds: No murmur heard. Pulmonary:     Effort: Pulmonary effort is normal. No respiratory distress.     Breath sounds: Normal breath sounds. No wheezing.  Abdominal:     General: There is no distension.     Palpations: Abdomen is soft.     Tenderness: There is no abdominal tenderness.  Musculoskeletal:     Comments: Left hemiplegia.  Skin:    General: Skin is warm.     Capillary Refill: Capillary refill takes less than 2 seconds.  Neurological:     Mental Status: He is alert.   Condition at discharge: good  The results of significant diagnostics from this hospitalization (including imaging, microbiology, ancillary and laboratory) are listed below for reference.   Imaging Studies: DG CHEST PORT 1 VIEW Result Date: 01/08/2024 EXAM: 1 VIEW(S) XRAY OF THE CHEST 01/08/2024 06:49:55 AM COMPARISON:  01/04/2024 CLINICAL HISTORY: Cough, hx sepsis; rover. FINDINGS: LINES, TUBES AND DEVICES: Right PICC terminates at expected location of superior vena cava. LUNGS AND PLEURA: No focal pulmonary opacity. No pulmonary edema. No pleural effusion. No pneumothorax. HEART AND MEDIASTINUM: Right PICC terminates at expected location of superior vena cava. No acute abnormality of the cardiac and mediastinal silhouettes. BONES AND SOFT TISSUES: No acute osseous abnormality. IMPRESSION: 1. No acute findings. 2. Right upper extremity PICC with tip in the expected location of the superior vena cava. Electronically signed by: Waddell  Stroud MD 01/08/2024 07:46 AM EDT RP Workstation: [id number]   DG ESOPHAGUS W SINGLE CM (SOL OR THIN BA) Result Date: 01/06/2024 CLINICAL DATA:  Recent CVA with left hemiparesis. Dysphagia, coughing when eating. Consult for esophagram for further evaluation. EXAM: ESOPHAGUS/BARIUM SWALLOW/TABLET STUDY TECHNIQUE: Single contrast examination was performed using thin liquid barium. Exam significantly limited secondary to poor patient mobility. Patient unable to stand or easily rotate on fluoro table. This exam was performed by Kimble Clas, PA-C, and was supervised and interpreted by Dr. MARLA Kilts. FLUOROSCOPY: Radiation Exposure Index (as provided by the fluoroscopic device): 36.30 mGy Kerma COMPARISON:  None Available. FINDINGS: Swallowing: Not directly assessed due to limited study. Pharynx: Not directly assessed due to limited study Esophagus: Mildly tortuous, otherwise unremarkable. Esophageal motility: Moderate esophageal dysmotility with proximal escape waves and contrast stasis in the upper esophagus. Hiatal Hernia: Tiny hiatal hernia. Gastroesophageal reflux: None visualized. Ingested 13 mm barium tablet: Delayed passage. Became temporarily stuck in lower esophagus just above the GE junction. Passed into the stomach with assistance after drinking thin barium. Other: None. IMPRESSION: 1.  Focused, limited exam. 2.  Tiny hiatal hernia. 3. Mild esophageal dysmotility, likely presbyesophagus. 4. Delayed passage of 13 mm barium tablet. Became stuck near GE junction. No underlying stricture. Likely due to dysmotility. Electronically Signed   By: Rockey Kilts M.D.   On: 01/06/2024 15:21   EEG adult Result Date: 01/06/2024 Shelton Arlin KIDD, MD     01/06/2024 11:47 AM Patient Name: TAEVON ASCHOFF MRN: [id number] Epilepsy Attending: Arlin KIDD Shelton Referring Physician/Provider: Cherlyn Labella, MD Date: 01/06/2024 Duration: 22.49 mins Patient history: 78yo M with syncope. EEG to evaluate for seizure Level of alertness: Awake AEDs during EEG study: None Technical aspects: This EEG study was done with scalp electrodes positioned according to the 10-20 International system of electrode placement. Electrical activity was reviewed with band pass filter of 1-70Hz , sensitivity of 7 uV/mm, display speed of 76mm/sec with a 60Hz  notched filter applied as appropriate. EEG data were recorded continuously and digitally stored.  Video monitoring was available and reviewed as appropriate. Description: The posterior dominant rhythm consists of 9 Hz activity of moderate voltage (25-35 uV) seen predominantly in posterior head regions, symmetric and reactive to eye opening and eye closing. There is intermittent 3 to 6 Hz theta-delta slowing in right temporal region. Hyperventilation and photic stimulation were not performed.   ABNORMALITY - Intermittent slow, right temporal region IMPRESSION: This study is suggestive of cortical dysfunction arising from right temporal region likely secondary to underlying structural abnormality. No seizures or epileptiform discharges were seen throughout the recording. Priyanka O Yadav   US  EKG SITE RITE Result Date: 01/06/2024 If [facility] image not attached, placement could not be confirmed due to current cardiac rhythm.  ECHOCARDIOGRAM COMPLETE Result Date: 01/05/2024     ECHOCARDIOGRAM REPORT   Patient Name:   BARTHOLOMEW RAMESH Date of Exam: 01/05/2024 Medical Rec #:  [id number]       Height:       70.0 in Accession #:    [id number]      Weight:       181.0 lb Date of Birth:  1945/09/18       BSA:          2.001 m Patient Age:    78 years        BP:           123/78 mmHg Patient Gender: M  HR:           80 bpm. Exam Location:  Inpatient Procedure: 2D Echo, Cardiac Doppler and Color Doppler (Both Spectral and Color            Flow Doppler were utilized during procedure). Indications:    Syncope R55  History:        Patient has prior history of Echocardiogram examinations, most                 recent 10/22/2016. Stroke and CKD, stage 3,                 Signs/Symptoms:Hypotension; Risk Factors:Sleep Apnea.  Sonographer:    Thea Norlander RCS Referring Phys: CAROLE N HALL IMPRESSIONS  1. Left ventricular ejection fraction, by estimation, is 55 to 60%. The left ventricle has normal function. The left ventricle has no regional wall motion abnormalities. Left ventricular diastolic parameters are consistent with Grade I diastolic dysfunction (impaired relaxation).  2. Right ventricular systolic function is normal. The right ventricular size is normal. Tricuspid regurgitation signal is inadequate for assessing PA pressure.  3. The mitral valve is normal in structure. No evidence of mitral valve regurgitation. No evidence of mitral stenosis.  4. The aortic valve is tricuspid. Aortic valve regurgitation is not visualized. No aortic stenosis is present.  5. The inferior vena cava is normal in size with greater than 50% respiratory variability, suggesting right atrial pressure of 3 mmHg. FINDINGS  Left Ventricle: Left ventricular ejection fraction, by estimation, is 55 to 60%. The left ventricle has normal function. The left ventricle has no regional wall motion abnormalities. The left ventricular internal cavity size was normal in size. There is  no left ventricular hypertrophy. Left  ventricular diastolic parameters are consistent with Grade I diastolic dysfunction (impaired relaxation). Right Ventricle: The right ventricular size is normal. No increase in right ventricular wall thickness. Right ventricular systolic function is normal. Tricuspid regurgitation signal is inadequate for assessing PA pressure. Left Atrium: Left atrial size was normal in size. Right Atrium: Right atrial size was normal in size. Pericardium: Trivial pericardial effusion is present. Mitral Valve: The mitral valve is normal in structure. No evidence of mitral valve regurgitation. No evidence of mitral valve stenosis. Tricuspid Valve: The tricuspid valve is normal in structure. Tricuspid valve regurgitation is not demonstrated. Aortic Valve: The aortic valve is tricuspid. Aortic valve regurgitation is not visualized. No aortic stenosis is present. Aortic valve peak gradient measures 6.5 mmHg. Pulmonic Valve: The pulmonic valve was normal in structure. Pulmonic valve regurgitation is not visualized. Aorta: The aortic root is normal in size and structure. Venous: The inferior vena cava is normal in size with greater than 50% respiratory variability, suggesting right atrial pressure of 3 mmHg. IAS/Shunts: No atrial level shunt detected by color flow Doppler.  LEFT VENTRICLE PLAX 2D LVIDd:         2.50 cm   Diastology LVIDs:         1.70 cm   LV e' medial:    9.36 cm/s LV PW:         1.10 cm   LV E/e' medial:  6.8 LV IVS:        0.90 cm   LV e' lateral:   10.80 cm/s LVOT diam:     2.10 cm   LV E/e' lateral: 5.9 LV SV:         49 LV SV Index:   25 LVOT Area:     3.46 cm  RIGHT VENTRICLE             IVC RV S prime:     13.20 cm/s  IVC diam: 1.90 cm TAPSE (M-mode): 2.2 cm LEFT ATRIUM             Index        RIGHT ATRIUM           Index LA diam:        3.70 cm 1.85 cm/m   RA Area:     13.10 cm LA Vol (A2C):   49.6 ml 24.79 ml/m  RA Volume:   32.90 ml  16.44 ml/m LA Vol (A4C):   37.1 ml 18.54 ml/m LA Biplane Vol: 47.1 ml  23.54 ml/m  AORTIC VALVE AV Area (Vmax): 2.21 cm AV Vmax:        127.00 cm/s AV Peak Grad:   6.5 mmHg LVOT Vmax:      81.20 cm/s LVOT Vmean:     51.400 cm/s LVOT VTI:       0.142 m  AORTA Ao Root diam: 3.50 cm Ao Asc diam:  3.40 cm MITRAL VALVE MV Area (PHT): 3.03 cm    SHUNTS MV Decel Time: 250 msec    Systemic VTI:  0.14 m MV E velocity: 63.40 cm/s  Systemic Diam: 2.10 cm MV A velocity: 91.70 cm/s MV E/A ratio:  0.69 Dalton McleanMD Electronically signed by Ezra Kanner Signature Date/Time: 01/05/2024/10:38:03 PM    Final    CT Angio Chest PE W and/or Wo Contrast Result Date: 01/04/2024 CLINICAL DATA:  Questionable sepsis, syncopal episode, previous history of pulmonary embolism and with hypotension. EXAM: CT ANGIOGRAPHY CHEST CT ABDOMEN AND PELVIS WITH CONTRAST TECHNIQUE: Multidetector CT imaging of the chest was performed using the standard protocol during bolus administration of intravenous contrast. Multiplanar CT image reconstructions and MIPs were obtained to evaluate the vascular anatomy. Multidetector CT imaging of the abdomen and pelvis was performed using the standard protocol during bolus administration of intravenous contrast. RADIATION DOSE REDUCTION: This exam was performed according to the departmental dose-optimization program which includes automated exposure control, adjustment of the mA and/or kV according to patient size and/or use of iterative reconstruction technique. CONTRAST:  75mL OMNIPAQUE  IOHEXOL  350 MG/ML SOLN COMPARISON:  Portable chest from today, portable chest 07/28/2023, and chest CTs without contrast 10/29/2019 and 05/09/2017. Abdomen pelvis CT compared with CT abdomen and pelvis without and with contrast 02/03/2020 and 08/27/2007. FINDINGS: CTA CHEST FINDINGS Cardiovascular: There is diagnostic pulmonary arterial opacification. No arterial dilatation or embolus is seen. The pulmonary veins are normal. The cardiac size is normal. There is no pericardial effusion. There are  left main scattered single-vessel calcific plaques of the LAD coronary artery. There is mild aortic tortuosity with scattered calcific plaques in the aorta and great vessels. No aneurysm, stenosis or dissection. Mediastinum/Nodes: Small hiatal hernia. There is a patulous esophagus, interval new mild to moderate distal esophageal wall thickening warranting consideration of endoscopic follow-up. The trachea and main bronchi are clear. There are slightly prominent right hilar lymph nodes up to 1 cm short axis, borderline prominent subcarinal and precarinal lymph nodes no further adenopathy. Axillary regions are clear. Thyroid  gland unremarkable. Lungs/Pleura: There is patchy hazy airspace disease in the anterior segment of the left upper lobe, small infiltrate in the infrahilar lingula with subsegmental bronchial impaction, and denser patchy consolidation in the posterior basal left lower lobe. Findings consistent with multilobar pneumonia. A follow-up study is recommended after treatment to ensure clearing. There is  diffuse bronchial thickening, interval increased widespread mosaicism consistent with air trapping with small airways disease. There are mild paraseptal emphysematous changes in both upper lobes, apical reticulated scar-like opacities are again noted. 9 mm stable solid right middle on 7:70 is presumed benign due to length of stability. There are scattered linear scar-like opacities both bases. No pleural effusions. Musculoskeletal: Osteopenia. There is chronic wedging and kyphoplasty cement in the T11 and 12 vertebral bodies. Degenerative changes lumbar spine. No acute or other significant osseous findings. Healed fractures are noted of some of the lateral left ribs. Unremarkable visualized chest wall. Review of the MIP images confirms the above findings. CT ABDOMEN and PELVIS FINDINGS Hepatobiliary: Respiratory motion limits fine detail. No focal abnormality is seen through the motion artifact. The  gallbladder again noted absent without biliary ductal dilatation. Pancreas: Partially atrophic. No focal abnormality is seen through the motion artifact. Spleen: Surgically absent. Adrenals/Urinary Tract: Cortical thinning in renal volume loss right-greater-than-left, are again noted, some progression noted since 2021. No mass enhancement of the kidneys is seen through the motion artifact. There is symmetric contrast uptake and excretion. There are Bosniak 2 subcentimeter cortical cysts which are too small to characterize. Some of these are new since 2021. No follow-up imaging is required. There is no urinary stone or obstruction. The bladder is diffusely thickened but also not fully distended. Correlate clinically for cystitis versus muscular hypertrophy. Stomach/Bowel: Fluid filling in the stomach. No overt wall thickening. No small bowel dilatation. Normal appendix. There is mild-to-moderate retained stool in the transverse colon. There is a nonobstructed short loop of the mid descending colon extending into a lateral abdominal wall hernia sac. No hernia incarceration is suspected. There is no wall thickening or dilatation of the colon, but there is a large amount of stool in the rectosigmoid segment. There are no findings of acute stercoral proctitis but disimpaction is likely indicated. Vascular/Lymphatic: Moderate aortoiliac atherosclerosis. 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 No adenopathy is seen. Reproductive: Mild prostatomegaly. Probable phleboliths. Both testicles are in the scrotal sac. There are postsurgical changes of prior vasectomy. Other: No free fluid, free hemorrhage or free air. Musculoskeletal: Osteopenia. There are mild chronic central endplate compression fractures at L2 and 4. 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 No new osseous abnormality.  No acute skeletal findings. Review of the MIP images confirms the above findings. IMPRESSION: 1. No evidence of pulmonary arterial dilatation or embolus. 2. Aortic and coronary artery atherosclerosis. 3. Multilobar pneumonia left upper and lower lobes. Follow-up study recommended after treatment to ensure clearing. 4. Diffuse bronchial thickening with increased mosaicism consistent with air trapping and small airways disease. 5. Mildly prominent right hilar lymph nodes, probably reactive. 6. Patulous esophagus with interval new mild to moderate distal esophageal wall thickening. Consider endoscopic follow-up. 7. Constipation with large amount of stool in the rectosigmoid segment. No findings of stercoral proctitis but disimpaction is likely indicated. 8. Left lateral abdominal wall hernia containing a short loop of the mid descending colon without evidence of incarceration or obstruction. 9. Cystitis or bladder hypertrophy versus bladder nondistention. 10. Osteopenia and degenerative change. Chronic compression fractures of T11 and T12 with kyphoplasty cement. 11. 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 12. Emphysema. Aortic Atherosclerosis (ICD10-I70.0) and Emphysema (ICD10-J43.9). Electronically Signed   By: Francis Quam M.D.   On: 01/04/2024 22:22   CT ABDOMEN PELVIS W CONTRAST Result Date: 01/04/2024 CLINICAL DATA:  Questionable sepsis, syncopal episode, previous history of pulmonary embolism and with hypotension. EXAM: CT ANGIOGRAPHY CHEST CT ABDOMEN AND PELVIS WITH CONTRAST TECHNIQUE: Multidetector CT imaging of the chest was performed using the standard  protocol during bolus administration of intravenous contrast. Multiplanar CT image reconstructions and MIPs were obtained to evaluate the vascular anatomy. Multidetector CT imaging of the abdomen and pelvis was performed using the standard protocol during bolus administration of intravenous contrast. RADIATION DOSE REDUCTION: This exam was performed according to the departmental dose-optimization program which includes automated exposure control, adjustment of the mA and/or kV according to patient size and/or use of iterative reconstruction technique. CONTRAST:  75mL OMNIPAQUE  IOHEXOL  350 MG/ML SOLN COMPARISON:  Portable chest from today, portable chest 07/28/2023, and chest CTs without contrast 10/29/2019 and 05/09/2017. Abdomen pelvis CT compared with CT abdomen and pelvis without and with contrast 02/03/2020 and 08/27/2007. FINDINGS: CTA CHEST FINDINGS Cardiovascular: There is diagnostic pulmonary arterial opacification. No arterial dilatation or embolus is seen. The pulmonary veins are normal. The cardiac size is normal. There is no pericardial effusion. There are left main scattered single-vessel calcific plaques of the LAD coronary artery. There is mild aortic tortuosity with scattered calcific plaques in the aorta and great vessels. No aneurysm, stenosis or dissection. Mediastinum/Nodes: Small hiatal hernia. There is a patulous esophagus, interval new mild to moderate distal esophageal wall thickening warranting consideration of endoscopic follow-up. The trachea and main bronchi are clear. There are slightly prominent right hilar lymph nodes up to 1 cm short axis, borderline prominent subcarinal and precarinal lymph nodes no further adenopathy. Axillary regions are clear. Thyroid  gland unremarkable. Lungs/Pleura: There is patchy hazy airspace disease in the anterior segment of the left upper lobe, small infiltrate in the infrahilar lingula with subsegmental bronchial impaction, and denser patchy consolidation  in the posterior basal left lower lobe. Findings consistent with multilobar pneumonia. A follow-up study is recommended after treatment to ensure clearing. There is diffuse bronchial thickening, interval increased widespread mosaicism consistent with air trapping with small airways disease. There are mild paraseptal emphysematous changes in both upper lobes, apical reticulated scar-like opacities are again noted. 9 mm stable solid right middle on 7:70 is presumed benign due to length of stability. There are scattered linear scar-like opacities both bases. No pleural effusions. Musculoskeletal: Osteopenia. There is chronic wedging and kyphoplasty cement in the T11 and 12 vertebral bodies. Degenerative changes lumbar spine. No acute or other significant osseous findings. Healed fractures are noted of some of the lateral left ribs. Unremarkable visualized chest wall. Review of the MIP images confirms the above findings. CT ABDOMEN and PELVIS FINDINGS Hepatobiliary: Respiratory motion limits fine detail. No focal abnormality is seen through the motion artifact. The gallbladder again noted absent without biliary ductal dilatation. Pancreas: Partially atrophic. No focal abnormality is seen through the motion artifact. Spleen: Surgically absent. Adrenals/Urinary Tract: Cortical thinning in renal volume loss right-greater-than-left, are again noted, some progression noted since 2021. No mass enhancement of the kidneys is seen through the motion artifact. There is symmetric contrast uptake and excretion. There are Bosniak 2 subcentimeter cortical cysts which are too small to characterize. Some of these are new since  2021. No follow-up imaging is required. There is no urinary stone or obstruction. The bladder is diffusely thickened but also not fully distended. Correlate clinically for cystitis versus muscular hypertrophy. Stomach/Bowel: Fluid filling in the stomach. No overt wall thickening. No small bowel dilatation. Normal  appendix. There is mild-to-moderate retained stool in the transverse colon. There is a nonobstructed short loop of the mid descending colon extending into a lateral abdominal wall hernia sac. No hernia incarceration is suspected. There is no wall thickening or dilatation of the colon, but there is a large amount of stool in the rectosigmoid segment. There are no findings of acute stercoral proctitis but disimpaction is likely indicated. Vascular/Lymphatic: Moderate aortoiliac atherosclerosis. 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 No adenopathy is seen. Reproductive: Mild prostatomegaly. Probable phleboliths. Both testicles are in the scrotal sac. There are postsurgical changes of prior vasectomy. Other: No free fluid, free hemorrhage or free air. Musculoskeletal: Osteopenia. There are mild chronic central endplate compression fractures at L2 and 4. 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 No new osseous abnormality.  No acute skeletal findings. Review of the MIP images confirms the above findings. IMPRESSION: 1. No evidence of pulmonary arterial dilatation or embolus. 2. Aortic and coronary artery atherosclerosis. 3. Multilobar pneumonia left upper and lower lobes. Follow-up study recommended after treatment to ensure clearing. 4. Diffuse bronchial thickening with increased mosaicism consistent with air trapping and small airways disease. 5. Mildly prominent right hilar lymph nodes, probably reactive. 6. Patulous esophagus with interval new mild to moderate distal esophageal wall thickening. Consider endoscopic follow-up. 7. Constipation with large amount of stool in the rectosigmoid segment. No  findings of stercoral proctitis but disimpaction is likely indicated. 8. Left lateral abdominal wall hernia containing a short loop of the mid descending colon without evidence of incarceration or obstruction. 9. Cystitis or bladder hypertrophy versus bladder nondistention. 10. Osteopenia and degenerative change. Chronic compression fractures of T11 and T12 with kyphoplasty cement. 11. 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 12. Emphysema. Aortic Atherosclerosis (ICD10-I70.0) and Emphysema (ICD10-J43.9). Electronically Signed   By: Francis Quam M.D.   On: 01/04/2024 22:22   CT Head Wo Contrast Result Date: 01/04/2024 EXAM: CT HEAD WITHOUT CONTRAST 01/04/2024 08:58:03 PM TECHNIQUE: CT of the head was performed without the administration of intravenous contrast. Automated exposure control, iterative reconstruction, and/or weight based adjustment of the mA/kV was utilized to reduce the radiation dose to as low as reasonably achievable. COMPARISON: 01/07/2020 CLINICAL HISTORY: Syncope/presyncope, cerebrovascular cause suspected. FINDINGS: BRAIN AND VENTRICLES: Old right MCA distribution infarct with encephalomalacic changes and ex vacuo dilatation of the right lateral ventricle. No acute hemorrhage. No evidence of acute infarct. No hydrocephalus. No extra-axial collection. No mass effect or midline shift. Mild age-related atrophy. Subcortical and periventricular small vessel ischemic changes. ORBITS: No acute abnormality. SINUSES: No acute abnormality. SOFT TISSUES AND SKULL: No acute soft tissue abnormality. No skull fracture. Mild intracranial atherosclerosis. IMPRESSION: 1. No acute intracranial abnormality. 2. Old right MCA territory infarct. Small vessel ischemic changes. Electronically signed by: Pinkie Pebbles MD 01/04/2024  09:04 PM EDT  RP Workstation: [id number]   DG Chest Port 1 View Result Date: 01/04/2024 CLINICAL DATA:  Questionable sepsis - evaluate for abnormality EXAM: PORTABLE CHEST 1 VIEW COMPARISON:  07/28/2023 FINDINGS: Heart and mediastinal contours are within normal limits. No focal opacities or effusions. No acute bony abnormality. No pneumothorax. IMPRESSION: No active disease. Electronically Signed   By: Franky Crease M.D.   On: 01/04/2024 18:14    Microbiology: Results for orders placed or performed during the hospital encounter of 01/04/24  Resp panel by RT-PCR (RSV, Flu A&B, Covid)     Status: None   Collection Time: 01/04/24  5:29 PM   Specimen: Nasal Swab  Result Value Ref Range Status   SARS Coronavirus 2 by RT PCR NEGATIVE NEGATIVE Final   Influenza A by PCR NEGATIVE NEGATIVE Final   Influenza B by PCR NEGATIVE NEGATIVE Final    Comment: (NOTE) The Xpert Xpress SARS-CoV-2/FLU/RSV plus assay is intended as an aid in the diagnosis of influenza from Nasopharyngeal swab specimens and should not be used as a sole basis for treatment. Nasal washings and aspirates are unacceptable for Xpert Xpress SARS-CoV-2/FLU/RSV testing.  Fact Sheet for Patients: BloggerCourse.com  Fact Sheet for Healthcare Providers: SeriousBroker.it  This test is not yet approved or cleared by the [place]  FDA and has been authorized for detection and/or diagnosis of SARS-CoV-2 by FDA under an Emergency Use Authorization (EUA). This EUA will remain in effect (meaning this test can be used) for the duration of the COVID-19 declaration under Section 564(b)(1) of the Act, 21 U.S.C. section 360bbb-3(b)(1), unless the authorization is terminated or revoked.     Resp Syncytial Virus by PCR NEGATIVE NEGATIVE Final    Comment: (NOTE) Fact Sheet for Patients: BloggerCourse.com  Fact Sheet for Healthcare  Providers: SeriousBroker.it  This test is not yet approved or cleared by the [place]  FDA and has been authorized for detection and/or diagnosis of SARS-CoV-2 by FDA under an Emergency Use Authorization (EUA). This EUA will remain in effect (meaning this test can be used) for the duration of the COVID-19 declaration under Section 564(b)(1) of the Act, 21 U.S.C. section 360bbb-3(b)(1), unless the authorization is terminated or revoked.  Performed at [facility], [address], [place], [place] [address]   Blood Culture (routine x 2)     Status: None   Collection Time: 01/04/24  5:29 PM   Specimen: BLOOD  Result Value Ref Range Status   Specimen Description BLOOD SITE NOT SPECIFIED  Final   Special Requests   Final    BOTTLES DRAWN AEROBIC AND ANAEROBIC Blood Culture results may not be optimal due to an inadequate volume of blood received in culture bottles   Culture   Final    NO GROWTH 5 DAYS Performed at [facility], [address], [place], [place] [address]    Report Status 01/09/2024 FINAL  Final  Urine Culture     Status: Abnormal   Collection Time: 01/04/24  5:32 PM   Specimen: Urine, Random  Result Value Ref Range Status   Specimen Description URINE, RANDOM  Final   Special Requests NONE Reflexed from [id number]  Final   Culture (A)  Final    <10,000 COLONIES/mL INSIGNIFICANT GROWTH Performed at [facility], [address], [place], [place] [address]    Report Status 01/05/2024 FINAL  Final  Blood Culture (routine x 2)     Status: None   Collection Time: 01/04/24  5:34 PM   Specimen:  BLOOD  Result Value Ref Range Status   Specimen Description BLOOD SITE NOT SPECIFIED  Final   Special Requests   Final    BOTTLES DRAWN AEROBIC AND ANAEROBIC Blood Culture results may not be optimal due to an inadequate volume of blood received in culture bottles   Culture   Final    NO GROWTH 5 DAYS Performed at [facility],  [address], [place], [place] [address]    Report Status 01/09/2024 FINAL  Final  MRSA Next Gen by PCR, Nasal     Status: None   Collection Time: 01/06/24 10:01 AM   Specimen: Nasal Mucosa; Nasal Swab  Result Value Ref Range Status   MRSA by PCR Next Gen NOT DETECTED NOT DETECTED Final    Comment: (NOTE) The GeneXpert MRSA Assay (FDA approved for NASAL specimens only), is one component of a comprehensive MRSA colonization surveillance program. It is not intended to diagnose MRSA infection nor to guide or monitor treatment for MRSA infections. Test performance is not FDA approved in patients less than 40 years old. Performed at [facility], [address] [place], [place] [address]    *Note: Due to a large number of results and/or encounters for the requested time period, some results have not been displayed. A complete set of results can be found in Results Review.    Labs: CBC: Recent Labs  Lab 01/06/24 0506 01/07/24 0149 01/08/24 0431 01/09/24 0450 01/10/24 0456  WBC 17.7* 15.3* 19.6* 18.5* 22.2*  NEUTROABS 12.0* 11.7* 14.4* 12.9* 14.5*  HGB 9.5* 8.4* 8.7* 9.4* 10.2*  HCT 30.1* 27.3* 27.8* 29.8* 32.3*  MCV 81.6 83.7 83.2 82.5 82.8  PLT 69* 90* 170 231 309   Basic Metabolic Panel: Recent Labs  Lab 01/05/24 0625 01/06/24 0506 01/07/24 0149 01/08/24 0431 01/09/24 0450 01/10/24 0456  NA 136 141 141 140 138 137  K 4.3 3.9 4.1 3.9 3.8 3.8  CL 109 111 111 112* 107 108  CO2 17* 19* 20* 20* 22 20*  GLUCOSE 160* 111* 135* 156* 124* 119*  BUN 29* 30* 32* 34* 38* 41*  CREATININE 1.68* 1.55* 1.27* 1.27* 1.22 1.28*  CALCIUM  8.2* 8.7* 8.7* 8.7* 8.8* 8.5*  MG 1.9  --   --   --   --   --   PHOS 2.8  --   --   --   --   --    Liver Function Tests: Recent Labs  Lab 01/04/24 1729 01/07/24 0149 01/08/24 0431 01/09/24 0450 01/10/24 0456  AST 29 21 25  45* 40  ALT 28 22 26  64* 65*  ALKPHOS 79 58 54 54 59  BILITOT 0.7 0.5 0.5 0.7 0.6  PROT 6.8 5.9* 5.7* 6.0* 6.2*   ALBUMIN 3.5 3.0* 2.9* 3.2* 3.3*   CBG: No results for input(s): GLUCAP in the last 168 hours.  Discharge time spent: 45 minutes  Signed: Jakhiya Brower Al-Sultani, MD [facility] 01/10/2024

## 2024-01-09 NOTE — Progress Notes (Signed)
 He is doing better this morning than when I saw him yesterday.  He is much more alert.  He seems like he is at his baseline mental status.  I am happy about that.  Chest x-ray yesterday which did not show any obvious pneumonia.  His blood count is now 230,000.  As expected, he has responded very well to the Decadron .  Hopefully, he will be able to go home soon.  From my point of view, I do not see a problem with him going home.  He is on Pradaxa .  Will need to make sure that he we will be able to get Pradaxa  as an outpatient.  He has had no bleeding.  There is been no abdominal pain.  He has had no cough or shortness of breath.  He has had no nausea or vomiting.  He seems to be eating pretty well.  Even though the urine culture has not grown out, I do believe that this is probably E. coli.  I would have to think this is by secondary to his self catheterizations.   His vital signs show temperature of 98.5.  Pulse 67.  Blood pressure 162/91.  His lungs are clear bilaterally.  He has good air movement bilaterally.  Cardiac exam regular rate and rhythm.  He has no murmurs, rubs or bruits.  Abdomen is soft.  Bowel sounds are present.  He has no fluid wave.  There is no palpable liver or spleen tip.  Extremity shows a paresis on the left side.  Right side is strong.  He has good range of motion of the joints on the right side.  Neurological exam shows no focal neurological deficits outside of the left hemiparesis.  Again, his platelet count is responded well.  Some of this is from the treating the urinary tract infection.  Some of this is the Decadron  that he is on.  He has completed the high-dose Decadron .  He only gets this whenever his platelet count drops.  He has this aortic thrombus.  He is on Pradaxa  now.  I do not see a problem with him being on the Pradaxa .  At some point, we will have to check his CT scan to see if this thrombus is improving.   Jeralyn Crease, MD  [place] 22:42

## 2024-01-09 NOTE — Progress Notes (Signed)
 Progress Note   Patient: Mark [name] FMW:[id number] DOB: [birth year] DOA: 01/04/2024     5 DOS: the patient was seen and examined on 01/09/2024   Brief hospital course: Mark [name] is a 78 y.o. male with medical history significant for CVA with residual left-sided deficits, ITP, lupus anticoagulant (on Xarelto  at home), neurogenic bladder requiring intermittent catheterization, seizure disorder, teflaro for stenting or syncope while sitting down eating with his wife.  CT angio chest revealed no evidence of pulmonary embolism however showed multifocal infiltrates in the left upper and lower lobes.  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  Urinalysis was positive for pyuria.  Urine culture and blood cultures were sent, patient was given broad-spectrum antibiotic and was admitted hospital for further evaluation and treatment.   Assessment and Plan:    Left lobar pneumonia Concern for aspiration pneumonia Possible aspiration pneumonia given history of episode of vomiting concurrent with loss of consciousness. No significant respiratory symptoms, maintains appropriate SpO2 on RA. - Afebrile, WBC slightly improved to 18.5, unclear if infectious or secondary to decadron  - CXR today showed no acute findings. - Completed cefepime  and flagyl  x 5 days   Syncope of unclear etiology Etiology unclear, possibly secondary to vasovagal reflex given the immediate prior sensation of food stuck in the esophagus. CT head showing  old right MCA infarct.  EEG showed cortical dysfunction from the right temporal region likely secondary to underlying structural abnormality.  No seizures were reported. Echo (01/05/2024) showed LVEF 55 to 60%, without significant valvular abnormalities.   Infrarenal aortic thrombus -  Vascular surgery saw  the patient and recommended outpatient follow-up with bilateral lower extremity duplexes. - Heme/Onc following - Continue Pradaxa      Idiopathic thrombocytopenic purpura Heme/Onc following.  PLTs improved to 170 - Completed Decadron   IDA Heme/Onc following - Received IV iron on 9/25  Esophageal dysmotility Patient reports history of intermittent difficulty swallowing with episode of significant food getting stuck in esophagus prior to admission associated with syncopal episode. - Esophagram completed here showed tiny hiatal hernia, mild esophageal dysmotility and likely presbyesophagus, as well as delayed passage of 13 mm barium tablet which became stuck near the GE junction likely due to dysmotility - Will refer patient to GI in the outpatient setting for further evaluation  Acute cystitis ruled out Initial concern for acute cystitis with UA showed cloudy urine positive leukocytes but negative nitrites. However, urine culture negative.  Hyperlipidemia Continue Crestor     H/o CVA with left sided residual deficits History Referral seen the patient and recommend home health PT on discharge  Stage I pressure injury noted over the right lateral back.  Continue prevention protocol. Wound 01/06/24 1212 Pressure Injury Back Lateral;Right;Upper Stage 1 -  Intact skin with non-blanchable redness of a localized area usually over a bony prominence. (Active)         Subjective: Patient was seen sitting in bedside chair.  Had no acute concerns today.  Was seen eating chicken wrap without any issues.  Denies any chest pain, shortness of breath, abdominal pain, urinary symptoms.  Does report 1 episode of vomiting overnight.  Has been able to tolerate p.o. intake without issue otherwise.  Physical Exam: Vitals:   01/09/24 0358 01/09/24 0756 01/09/24 1141 01/09/24 1557  BP: (!) 158/83 (!) 162/91 134/76 (!) 138/95  Pulse: 64 67 76  73  Resp:  17 17 18   Temp: 97.9 F (36.6 C) 98.5 F (36.9 C)  98.1 F (36.7 C) 97.9 F (36.6 C)  TempSrc:      SpO2: 97% 95% 95% 95%  Weight:      Height:       Physical Exam Constitutional:      General: He is not in acute distress.    Appearance: He is not ill-appearing.  HENT:     Mouth/Throat:     Mouth: Mucous membranes are moist.  Cardiovascular:     Rate and Rhythm: Normal rate and regular rhythm.     Heart sounds: No murmur heard. Pulmonary:     Effort: Pulmonary effort is normal. No respiratory distress.     Breath sounds: Normal breath sounds. No wheezing.  Abdominal:     General: There is no distension.     Palpations: Abdomen is soft.     Tenderness: There is no abdominal tenderness.  Musculoskeletal:     Comments: Right upper extremity PICC line in place.  Left hemiplegia.  Skin:    General: Skin is warm.     Capillary Refill: Capillary refill takes less than 2 seconds.  Neurological:     Mental Status: He is alert.     Family Communication: None  Disposition: Patient medically appropriate for discharge home.  However, patient's wife expressed inability to take him home today.  Will be discharged on 01/10/2024  Status is: Inpatient Planned Discharge Destination: Home    Time spent: 40 minutes  Author: Duffy Larch, MD 01/09/2024 6:17 PM  For on call review [email or web address].

## 2024-01-09 NOTE — Progress Notes (Signed)
 Occupational Therapy Treatment Patient Details Name: Mark [name] MRN: [id number] DOB: [birth year] Today's Date: 01/09/2024   History of present illness 78 y.o. male presents to [facility] hospital on 01/04/2024 after an episode of syncope. 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 PMH includes CVA, ITP, lupus, neurogenic bladder, seizure disorder.   OT comments  Patient demonstrating good gains with OT treatment with bed mobility, transfers, and self care.  Discharge plan continues to be appropriate for home with HHOT to follow.  Acute OT to continue to follow to address established goals.       If plan is discharge home, recommend the following:  A little help with walking and/or transfers;A little help with bathing/dressing/bathroom;Help with stairs or ramp for entrance   Equipment Recommendations  None recommended by OT    Recommendations for Other Services      Precautions / Restrictions Precautions Precautions: Fall Recall of Precautions/Restrictions: Intact Precaution/Restrictions Comments: watch BP       Mobility Bed Mobility Overal bed mobility: Needs Assistance Bed Mobility: Supine to Sit     Supine to sit: Min assist     General bed mobility comments: assistance with scooting towards EOB and trunk    Transfers Overall transfer level: Needs assistance Equipment used: Hemi-walker Transfers: Sit to/from Stand Sit to Stand: Contact guard assist           General transfer comment: CGA for safety and increased time     Balance Overall balance assessment: Needs assistance   Sitting balance-Leahy Scale: Good     Standing balance support: Single extremity supported Standing balance-Leahy Scale: Poor Standing balance comment: reliant on external support                           ADL either performed or assessed with clinical judgement   ADL Overall ADL's : Needs assistance/impaired     Grooming: Wash/dry  hands;Wash/dry face;Oral care;Set up;Sitting   Upper Body Bathing: Supervision/ safety;Sitting       Upper Body Dressing : Minimal assistance;Sitting Upper Body Dressing Details (indicate cue type and reason): education on hemi technique                   General ADL Comments: patient states he attempts to perform on his own at home but wife often assists    Extremity/Trunk Assessment Upper Extremity Assessment LUE Coordination: decreased fine motor;decreased gross motor            Vision       Perception     Praxis     Communication Communication Communication: Impaired Factors Affecting Communication: Hearing impaired   Cognition Arousal: Alert Behavior During Therapy: WFL for tasks assessed/performed Cognition: No apparent impairments                               Following commands: Intact        Cueing   Cueing Techniques: Verbal cues  Exercises      Shoulder Instructions       General Comments VSS on RA    Pertinent Vitals/ Pain       Pain Assessment Pain Assessment: Faces Faces Pain Scale: Hurts a little bit Pain Location: generalized Pain Descriptors / Indicators: Grimacing Pain Intervention(s): Monitored during session  Home Living  Prior Functioning/Environment              Frequency  Min 2X/week        Progress Toward Goals  OT Goals(current goals can now be found in the care plan section)  Progress towards OT goals: Progressing toward goals  Acute Rehab OT Goals Patient Stated Goal: to go home OT Goal Formulation: With patient Time For Goal Achievement: 01/19/24 Potential to Achieve Goals: Good ADL Goals Pt Will Perform Lower Body Bathing: with set-up;sit to/from stand;sitting/lateral leans Pt Will Perform Lower Body Dressing: with set-up;sitting/lateral leans;sit to/from stand Pt Will Transfer to Toilet: with set-up;ambulating;regular  height toilet;grab bars Pt Will Perform Toileting - Clothing Manipulation and hygiene: with set-up;sitting/lateral leans;sit to/from stand Additional ADL Goal #1: Patient will be able to complete functional task in standing for 3 minutes prior to needing seated rest break in order to increase overall activity tolerance.  Plan      Co-evaluation                 AM-PAC OT 6 Clicks Daily Activity     Outcome Measure   Help from another person eating meals?: A Little Help from another person taking care of personal grooming?: A Little Help from another person toileting, which includes using toliet, bedpan, or urinal?: A Little Help from another person bathing (including washing, rinsing, drying)?: A Little Help from another person to put on and taking off regular upper body clothing?: A Little Help from another person to put on and taking off regular lower body clothing?: A Little 6 Click Score: 18    End of Session Equipment Utilized During Treatment: Gait belt;Rolling walker (2 wheels)  OT Visit Diagnosis: Unsteadiness on feet (R26.81);Other abnormalities of gait and mobility (R26.89);Muscle weakness (generalized) (M62.81);History of falling (Z91.81)   Activity Tolerance Patient tolerated treatment well   Patient Left in chair;with call bell/phone within reach;with chair alarm set   Nurse Communication Mobility status        Time: 9241-9177 OT Time Calculation (min): 24 min  Charges: OT General Charges $OT Visit: 1 Visit OT Treatments $Self Care/Home Management : 23-37 mins  Dick Laine, OTA Acute Rehabilitation Services  Office [phone]   Jeb LITTIE Laine 01/09/2024, 10:57 AM

## 2024-01-09 NOTE — Plan of Care (Signed)

## 2024-01-10 ENCOUNTER — Other Ambulatory Visit (HOSPITAL_COMMUNITY): Payer: Self-pay

## 2024-01-10 DIAGNOSIS — R55 Syncope and collapse: Secondary | ICD-10-CM | POA: Insufficient documentation

## 2024-01-10 DIAGNOSIS — N3000 Acute cystitis without hematuria: Secondary | ICD-10-CM | POA: Insufficient documentation

## 2024-01-10 DIAGNOSIS — R7989 Other specified abnormal findings of blood chemistry: Secondary | ICD-10-CM | POA: Insufficient documentation

## 2024-01-10 DIAGNOSIS — K224 Dyskinesia of esophagus: Secondary | ICD-10-CM | POA: Insufficient documentation

## 2024-01-10 DIAGNOSIS — J690 Pneumonitis due to inhalation of food and vomit: Secondary | ICD-10-CM | POA: Diagnosis present

## 2024-01-10 LAB — CBC WITH DIFFERENTIAL/PLATELET
Abs Immature Granulocytes: 1.27 K/uL — ABNORMAL HIGH (ref 0.00–0.07)
Basophils Absolute: 0.2 K/uL — ABNORMAL HIGH (ref 0.0–0.1)
Basophils Relative: 1 %
Eosinophils Absolute: 0 K/uL (ref 0.0–0.5)
Eosinophils Relative: 0 %
HCT: 32.3 % — ABNORMAL LOW (ref 39.0–52.0)
Hemoglobin: 10.2 g/dL — ABNORMAL LOW (ref 13.0–17.0)
Immature Granulocytes: 6 %
Lymphocytes Relative: 12 %
Lymphs Abs: 2.6 K/uL (ref 0.7–4.0)
MCH: 26.2 pg (ref 26.0–34.0)
MCHC: 31.6 g/dL (ref 30.0–36.0)
MCV: 82.8 fL (ref 80.0–100.0)
Monocytes Absolute: 3.8 K/uL — ABNORMAL HIGH (ref 0.1–1.0)
Monocytes Relative: 17 %
Neutro Abs: 14.5 K/uL — ABNORMAL HIGH (ref 1.7–7.7)
Neutrophils Relative %: 64 %
Platelets: 309 K/uL (ref 150–400)
RBC: 3.9 MIL/uL — ABNORMAL LOW (ref 4.22–5.81)
RDW: 19.8 % — ABNORMAL HIGH (ref 11.5–15.5)
Smear Review: NORMAL
WBC: 22.2 K/uL — ABNORMAL HIGH (ref 4.0–10.5)
nRBC: 0.8 % — ABNORMAL HIGH (ref 0.0–0.2)

## 2024-01-10 LAB — COMPREHENSIVE METABOLIC PANEL WITH GFR
ALT: 65 U/L — ABNORMAL HIGH (ref 0–44)
AST: 40 U/L (ref 15–41)
Albumin: 3.3 g/dL — ABNORMAL LOW (ref 3.5–5.0)
Alkaline Phosphatase: 59 U/L (ref 38–126)
Anion gap: 9 (ref 5–15)
BUN: 41 mg/dL — ABNORMAL HIGH (ref 8–23)
CO2: 20 mmol/L — ABNORMAL LOW (ref 22–32)
Calcium: 8.5 mg/dL — ABNORMAL LOW (ref 8.9–10.3)
Chloride: 108 mmol/L (ref 98–111)
Creatinine, Ser: 1.28 mg/dL — ABNORMAL HIGH (ref 0.61–1.24)
GFR, Estimated: 57 mL/min — ABNORMAL LOW (ref >60)
Glucose, Bld: 119 mg/dL — ABNORMAL HIGH (ref 70–99)
Potassium: 3.8 mmol/L (ref 3.5–5.1)
Sodium: 137 mmol/L (ref 135–145)
Total Bilirubin: 0.6 mg/dL (ref 0.0–1.2)
Total Protein: 6.2 g/dL — ABNORMAL LOW (ref 6.5–8.1)

## 2024-01-10 MED ORDER — AMLODIPINE BESYLATE 10 MG PO TABS: 10.0000 mg | ORAL_TABLET | Freq: Every day | ORAL | Status: DC

## 2024-01-10 NOTE — Discharge Instructions (Signed)
Information on my medicine - Pradaxa (dabigatran)  Why was Pradaxa prescribed for you? Pradaxa was prescribed for you to reduce the risk of forming blood clots that cause a stroke if you have a medical condition called atrial fibrillation (a type of irregular heartbeat).    What do you Need to know about PradAXa? Take your Pradaxa TWICE DAILY - one capsule in the morning and one tablet in the evening with or without food.  It would be best to take the doses about the same time each day.  The capsules should not be broken, chewed or opened - they must be swallowed whole.  Do not store Pradaxa in other medication containers - once the bottle is opened the Pradaxa should be used within FOUR months; throw away any capsules that haven't been by that time.  Take Pradaxa exactly as prescribed by your doctor.  DO NOT stop taking Pradaxa without talking to the doctor who prescribed the medication.  Stopping without other stroke prevention medication to take the place of Pradaxa may increase your risk of developing a clot that causes a stroke.  Refill your prescription before you run out.  After discharge, you should have regular check-up appointments with your healthcare provider that is prescribing your Pradaxa.  In the future your dose may need to be changed if your kidney function or weight changes by a significant amount.  What do you do if you miss a dose? If you miss a dose, take it as soon as you remember on the same day.  If your next dose is less than 6 hours away, skip the missed dose.  Do not take two doses of PRADAXA at the same time.  Important Safety Information A possible side effect of Pradaxa is bleeding. You should call your healthcare provider right away if you experience any of the following: ? Bleeding from an injury or your nose that does not stop. ? Unusual colored urine (red or dark brown) or unusual colored stools (red or black). ? Unusual bruising for unknown  reasons. ? A serious fall or if you hit your head (even if there is no bleeding).  Some medicines may interact with Pradaxa and might increase your risk of bleeding or clotting while on Pradaxa. To help avoid this, consult your healthcare provider or pharmacist prior to using any new prescription or non-prescription medications, including herbals, vitamins, non-steroidal anti-inflammatory drugs (NSAIDs) and supplements.  This website has more information on Pradaxa (dabigatran): https://www.pradaxa.com     

## 2024-01-10 NOTE — Plan of Care (Signed)

## 2024-01-11 DIAGNOSIS — A419 Sepsis, unspecified organism: Secondary | ICD-10-CM | POA: Diagnosis not present

## 2024-01-19 DIAGNOSIS — I7410 Embolism and thrombosis of unspecified parts of aorta: Secondary | ICD-10-CM | POA: Diagnosis not present

## 2024-01-19 DIAGNOSIS — G40909 Epilepsy, unspecified, not intractable, without status epilepticus: Secondary | ICD-10-CM | POA: Diagnosis not present

## 2024-01-19 DIAGNOSIS — D68312 Antiphospholipid antibody with hemorrhagic disorder: Secondary | ICD-10-CM | POA: Diagnosis not present

## 2024-01-19 DIAGNOSIS — K224 Dyskinesia of esophagus: Secondary | ICD-10-CM | POA: Diagnosis not present

## 2024-01-19 DIAGNOSIS — I69359 Hemiplegia and hemiparesis following cerebral infarction affecting unspecified side: Secondary | ICD-10-CM | POA: Diagnosis not present

## 2024-01-19 DIAGNOSIS — Z8701 Personal history of pneumonia (recurrent): Secondary | ICD-10-CM | POA: Diagnosis not present

## 2024-01-19 DIAGNOSIS — G894 Chronic pain syndrome: Secondary | ICD-10-CM | POA: Diagnosis not present

## 2024-01-19 DIAGNOSIS — N319 Neuromuscular dysfunction of bladder, unspecified: Secondary | ICD-10-CM | POA: Diagnosis not present

## 2024-01-19 DIAGNOSIS — N390 Urinary tract infection, site not specified: Secondary | ICD-10-CM | POA: Diagnosis not present

## 2024-01-19 DIAGNOSIS — D693 Immune thrombocytopenic purpura: Secondary | ICD-10-CM | POA: Diagnosis not present

## 2024-01-19 DIAGNOSIS — R945 Abnormal results of liver function studies: Secondary | ICD-10-CM | POA: Diagnosis not present

## 2024-01-21 DIAGNOSIS — N183 Chronic kidney disease, stage 3 unspecified: Secondary | ICD-10-CM | POA: Diagnosis not present

## 2024-01-21 DIAGNOSIS — I7409 Other arterial embolism and thrombosis of abdominal aorta: Secondary | ICD-10-CM | POA: Diagnosis not present

## 2024-01-21 DIAGNOSIS — N319 Neuromuscular dysfunction of bladder, unspecified: Secondary | ICD-10-CM | POA: Diagnosis not present

## 2024-01-21 DIAGNOSIS — I129 Hypertensive chronic kidney disease with stage 1 through stage 4 chronic kidney disease, or unspecified chronic kidney disease: Secondary | ICD-10-CM | POA: Diagnosis not present

## 2024-01-21 DIAGNOSIS — I69398 Other sequelae of cerebral infarction: Secondary | ICD-10-CM | POA: Diagnosis not present

## 2024-01-21 DIAGNOSIS — I69354 Hemiplegia and hemiparesis following cerebral infarction affecting left non-dominant side: Secondary | ICD-10-CM | POA: Diagnosis not present

## 2024-01-21 DIAGNOSIS — G4733 Obstructive sleep apnea (adult) (pediatric): Secondary | ICD-10-CM | POA: Diagnosis not present

## 2024-01-21 DIAGNOSIS — K449 Diaphragmatic hernia without obstruction or gangrene: Secondary | ICD-10-CM | POA: Diagnosis not present

## 2024-01-21 DIAGNOSIS — G40909 Epilepsy, unspecified, not intractable, without status epilepticus: Secondary | ICD-10-CM | POA: Diagnosis not present

## 2024-01-21 DIAGNOSIS — H5340 Unspecified visual field defects: Secondary | ICD-10-CM | POA: Diagnosis not present

## 2024-01-21 DIAGNOSIS — D509 Iron deficiency anemia, unspecified: Secondary | ICD-10-CM | POA: Diagnosis not present

## 2024-02-02 DIAGNOSIS — N3020 Other chronic cystitis without hematuria: Secondary | ICD-10-CM | POA: Diagnosis not present

## 2024-02-25 DIAGNOSIS — N319 Neuromuscular dysfunction of bladder, unspecified: Secondary | ICD-10-CM | POA: Diagnosis not present

## 2024-03-01 DIAGNOSIS — R399 Unspecified symptoms and signs involving the genitourinary system: Secondary | ICD-10-CM | POA: Diagnosis not present

## 2024-03-08 ENCOUNTER — Other Ambulatory Visit (HOSPITAL_COMMUNITY): Payer: Self-pay

## 2024-03-10 ENCOUNTER — Other Ambulatory Visit: Payer: Self-pay | Admitting: *Deleted

## 2024-03-10 DIAGNOSIS — D693 Immune thrombocytopenic purpura: Secondary | ICD-10-CM

## 2024-03-10 DIAGNOSIS — I639 Cerebral infarction, unspecified: Secondary | ICD-10-CM

## 2024-03-10 MED ORDER — DABIGATRAN ETEXILATE MESYLATE 150 MG PO CAPS: 150.0000 mg | ORAL_CAPSULE | Freq: Two times a day (BID) | ORAL | 2 refills | Status: AC

## 2024-03-30 ENCOUNTER — Telehealth: Payer: Self-pay

## 2024-03-30 DIAGNOSIS — G8110 Spastic hemiplegia affecting unspecified side: Secondary | ICD-10-CM

## 2024-03-30 NOTE — Progress Notes (Signed)
 Complex Care Management Note  Care Guide Note 03/30/2024 Name: Mark [name] MRN: [id number] DOB: [birth year]  Mark [name] is a 78 y.o. year old male who sees Teresa Channel, MD for primary care. I reached out to [name] by phone today to offer complex care management services.  [name] was given information about Complex Care Management services today including:   The Complex Care Management services include support from the care team which includes your Nurse Care Manager, Clinical Social Worker, or Pharmacist.  The Complex Care Management team is here to help remove barriers to the health concerns and goals most important to you. Complex Care Management services are voluntary, and the patient may decline or stop services at any time by request to their care team member.   Complex Care Management Consent Status: Patient agreed to services and verbal consent obtained.   Follow up plan:  Telephone appointment with complex care management team member scheduled for:  04/12/24 at 2:00 p.m.   Encounter Outcome:  Patient Scheduled  Dream[facility]  [facility], [facility] VBCI Assistant Direct Dial: [phone]  Fax: [phone]

## 2024-04-12 ENCOUNTER — Telehealth: Payer: Self-pay

## 2024-04-12 NOTE — Patient Outreach (Addendum)
 Contacted patient's wife, Norbert Malkin, she requested call back another day, rescheduled for Monday 04/19/24 1:30pm due to family will be visiting and leaving on 04/16/24.

## 2024-04-19 ENCOUNTER — Telehealth: Payer: Self-pay

## 2024-04-19 NOTE — Patient Instructions (Signed)
 [name] - I am sorry I was unable to reach you today for our scheduled appointment. I work with Teresa Channel, MD and am calling to support your healthcare needs. Please contact me at [phone] at your earliest convenience. I look forward to speaking with you soon.   Thank you,  Santana Stamp BSN, CCM [place]  [facility] RN Care Manager Direct Dial: [phone]  Fax: [phone]

## 2024-04-27 ENCOUNTER — Telehealth: Payer: Self-pay | Admitting: *Deleted

## 2024-04-27 NOTE — Progress Notes (Signed)
 Complex Care Management Care Guide Note  04/27/2024 Name: ARJAN STROHM MRN: [id number] DOB: [birth year]  KHANH TANORI is a 79 y.o. year old male who is a primary care patient of Teresa Channel, MD and is actively engaged with the care management team. I reached out to [name] by phone today to assist with re-scheduling  with the RN Case Manager.  Follow up plan: Unsuccessful telephone outreach attempt made. A HIPAA compliant phone message was left for the patient providing contact information and requesting a return call.  Harlene Satterfield  [facility]  [facility], [facility] Guide  Direct Dial: [phone]  Fax [phone]

## 2024-04-30 NOTE — Progress Notes (Signed)
 Complex Care Management Care Guide Note  04/30/2024 Name: Mark [name] MRN: [id number] DOB: 1945/11/22  Mark [name] is a 79 y.o. year old male who is a primary care patient of Teresa Channel, MD and is actively engaged with the care management team. I reached out to [name] by phone today to assist with re-scheduling  with the RN Case Manager.  Follow up plan: Unsuccessful telephone outreach attempt made. A HIPAA compliant phone message was left for the patient providing contact information and requesting a return call. No further outreach attempts will be made at this time. We have been unable to contact the patient to reschedule for complex care management services.  Harlene Satterfield  [facility]  Value-Based Care Institute, [facility] Guide  Direct Dial: [phone]  Fax [phone]

## 2024-05-21 ENCOUNTER — Other Ambulatory Visit: Payer: Self-pay

## 2024-05-21 ENCOUNTER — Encounter: Payer: Self-pay | Admitting: Hematology & Oncology

## 2024-05-21 ENCOUNTER — Emergency Department (HOSPITAL_COMMUNITY)

## 2024-05-21 ENCOUNTER — Emergency Department (HOSPITAL_COMMUNITY)
Admission: EM | Admit: 2024-05-21 | Discharge: 2024-05-21 | Disposition: A | Discharge status: Home / Self Care | Source: Home / Self Care | Attending: Emergency Medicine | Admitting: Emergency Medicine

## 2024-05-21 DIAGNOSIS — S60221A Contusion of right hand, initial encounter: Secondary | ICD-10-CM

## 2024-05-21 NOTE — ED Triage Notes (Signed)
 Pt BIB [facility] from [facility] , pt was trying to pull pants up when he lost his balance and fell onto his left hand, noteable swelling and discoloration, pt with hx of stroke and had left sided deficits, pt with left sided hernia but denies pain to area, pt also denies pain to left hand, - LOC, - hitting , pt on xarelto .

## 2024-05-21 NOTE — Discharge Instructions (Addendum)
 You have been evaluated for your fall.  Fortunately x-ray of your left hand did not show any broken bone.  You do have a large bruise to the hand due to being on blood thinner medication.  Continue to use ice pack to help decrease the swelling.  Wear Ace wrap for stability and support.  You may take over-the-counter Tylenol  as needed for pain.  Follow-up with your doctor for further care.

## 2024-05-21 NOTE — ED Provider Triage Note (Signed)
 Emergency Medicine Provider Triage Evaluation Note  Mark [name] , a 79 y.o. male  was evaluated in triage.  Pt complains of fall. Pt has prior stroke with L side weakness.  He was in the process of pulling up his pants when he lost balance and fell, striking his L hand against the ground just PTA.  Denies hitting head of LOC, no other injury.  Currently on blood thinner  Review of Systems  Positive: As above Negative: As above  Physical Exam  BP (!) 145/80 (BP Location: Right Arm)   Pulse 86   Temp 98.9 F (37.2 C) (Oral)   Resp 18   SpO2 93%  Gen:   Awake, no distress   Resp:  Normal effort  MSK:   Moves extremities without difficulty  Other:  L hand: large hematoma to dorsum of hand.  L wrist and L elbow nontender  Medical Decision Making  Medically screening exam initiated at 7:19 PM.  Appropriate orders placed.  Mark [name] was informed that the remainder of the evaluation will be completed by another provider, this initial triage assessment does not replace that evaluation, and the importance of remaining in the ED until their evaluation is complete.     Nivia Colon, PA-C 05/21/24 1920

## 2024-05-21 NOTE — ED Provider Notes (Signed)
 " Mark [name]   CSN: [id number] Arrival date & time: 05/21/24  1845     Patient presents with: Fall and Hand Injury (/)   Mark [name] is a 79 y.o. male.   The history is provided by the patient and medical records. No language interpreter was used.  Fall  Hand Injury    79 year old male with history of prior stroke causing left-sided weakness currently on Xarelto  presenting for evaluation of a fall.  Patient states today he was in the process of pulling up his pants when he lost balance and fell to the ground.  He struck his left hand against the ground.  He denies hitting his head or loss of consciousness.  He noticed bruising to his left hand.  He does not complain of any headache, neck pain, chest pain, trouble breathing, abdominal pain or pain anywhere else.  He denies any precipitating symptoms prior to the fall.  Prior to Admission medications  Medication Sig Start Date End Date Taking? Authorizing Provider  acetaminophen  (TYLENOL ) 500 MG tablet Take 1,000 mg by mouth every 6 (six) hours as needed for mild pain (pain score 1-3). Patient takes two tablets (1000mg ) at bedtime every night. Patient also takes additional doses as needed and with his Norco 10-325.    [provider]  alendronate (FOSAMAX) 70 MG tablet Take 70 mg by mouth once a week. Take one tablet (70mg ) by mouth once per week on Monday. 10/24/23   [provider]  amLODipine  (NORVASC ) 10 MG tablet Take 10 mg by mouth at bedtime. 12/09/23   [provider]  aspirin  EC 81 MG tablet Take 81 mg by mouth at bedtime. Swallow whole.    [provider]  baclofen  (LIORESAL ) 10 MG tablet Take 10 mg by mouth in the morning and at bedtime.    [provider]  buPROPion  (WELLBUTRIN  XL) 150 MG 24 hr tablet Take 150 mg by mouth in the morning. 12/19/14   [provider]  Cholecalciferol  (VITAMIN D3) 50 MCG (2000 UT) TABS  Take 2,000 Units by mouth at bedtime.    [provider]  dabigatran  (PRADAXA ) 150 MG CAPS capsule Take 1 capsule (150 mg total) by mouth every 12 (twelve) hours. 03/10/24   [name], MD  dexamethasone  (DECADRON ) 4 MG tablet TAKE 10 TABLETS BY MOUTH ONCE DAILY AS NEEDED FOR  3  DAYS  FOR  ITP 10/13/23   [name], MD  HYDROcodone -acetaminophen  (NORCO) 10-325 MG tablet Take 1 tablet by mouth 2 (two) times daily as needed for moderate pain (pain score 4-6). 11/20/23   [provider]  levETIRAcetam  (KEPPRA ) 500 MG tablet Take 1,500 mg by mouth in the morning and at bedtime. 12/24/11   [provider]  melatonin 5 MG TABS Take 1 tablet (5 mg total) by mouth at bedtime as needed. Patient taking differently: Take 10 mg by mouth at bedtime. 07/21/23   Love, [name], PA-C  omeprazole (PRILOSEC) 40 MG capsule Take 40 mg by mouth daily before breakfast. 08/01/19   [provider]  polyethylene glycol (MIRALAX  / GLYCOLAX ) 17 g packet Take 17 g by mouth 2 (two) times daily. Patient taking differently: Take 17 g by mouth daily as needed for mild constipation. 08/12/23   Love, [name], PA-C  rosuvastatin  (CRESTOR ) 40 MG tablet Take 40 mg by mouth at bedtime.    [provider]  sertraline  (ZOLOFT ) 100 MG tablet  Take 200 mg by mouth in the morning.     [provider]  THERATEARS 0.25 % SOLN Place 1 drop into both eyes 2 (two) times daily as needed (for redness or irritation).    [provider]  trolamine salicylate (BLUE-EMU HEMP) 10 % cream Apply 1 Application topically as needed for muscle pain (Hand pain).    [provider]    Allergies: Benadryl  [diphenhydramine ], Pepcid  [famotidine ], Beef (diagnostic), Chicken meat (diagnostic), Food, and Pork (diagnostic)    Review of Systems  Skin:  Negative for wound.  All other systems reviewed and are negative.   Updated Vital Signs BP (!) 145/80 (BP Location: Right Arm)   Pulse  86   Temp 98.9 F (37.2 C) (Oral)   Resp 18   SpO2 93%   Physical Exam Constitutional:      General: He is not in acute distress.    Appearance: He is well-developed.     Comments: Patient is sitting in a chair appears to be in no acute discomfort.  He is holding his left hand.  HENT:     Head: Normocephalic and atraumatic.  Eyes:     Conjunctiva/sclera: Conjunctivae normal.  Cardiovascular:     Rate and Rhythm: Normal rate and regular rhythm.     Pulses: Normal pulses.     Heart sounds: Normal heart sounds.  Pulmonary:     Effort: Pulmonary effort is normal.     Breath sounds: Normal breath sounds.  Abdominal:     Palpations: Abdomen is soft.  Musculoskeletal:        General: Signs of injury (Left hand: Ecchymosis noted to the dorsum of the hand.  No tenderness to left wrist or elbow.  Able to make a fist with difficulty due to his baseline weakness from stroke.) present.     Cervical back: Normal range of motion and neck supple.  Skin:    Findings: No rash.  Neurological:     Mental Status: He is alert. Mental status is at baseline.     Comments: Weakness to left upper and left lower extremity secondary to prior stroke     (all labs ordered are listed, but only abnormal results are displayed) Labs Reviewed - No data to display  EKG: None  Radiology: DG Hand Complete Left Result Date: 05/21/2024 EXAM: 3 OR MORE VIEW(S) XRAY OF THE LEFT HAND 05/21/2024 07:38:18 PM COMPARISON: None available. CLINICAL HISTORY: Recent fall with hand pain. FINDINGS: BONES AND JOINTS: No acute fracture or dislocation is seen. Degenerative changes at the first [facility] joint are noted. No other focal abnormality is seen. SOFT TISSUES: Dorsal soft tissue swelling is noted, likely related to the recent injury. IMPRESSION: 1. No acute fracture or dislocation. 2. Dorsal soft tissue swelling, likely related to the injury. Electronically signed by: Oneil Devonshire MD 05/21/2024 07:43 PM EST RP Workstation:  [id number]     Procedures   Medications Ordered in the ED - No data to display                                  Medical Decision Making Amount and/or Complexity of Data Reviewed Radiology: ordered.   BP (!) 145/80 (BP Location: Right Arm)   Pulse 86   Temp 98.9 F (37.2 C) (Oral)   Resp 18   SpO2 93%   57:33 PM  79 year old male with history of prior stroke causing left-sided weakness currently  on Xarelto  presenting for evaluation of a fall.  Patient states today he was in the process of pulling up his pants when he lost balance and fell to the ground.  He struck his left hand against the ground.  He denies hitting his head or loss of consciousness.  He noticed bruising to his left hand.  He does not complain of any headache, neck pain, chest pain, trouble breathing, abdominal pain or pain anywhere else.  He denies any precipitating symptoms prior to the fall.  Exam notable for large ecchymosis noted to the dorsum of the left hand but otherwise no other signs of injury noted.  Patient has weakness of left upper and lower extremity from his prior stroke.  He is mentating appropriately.  He does not have any signs of head or neck injury.  X-ray of the left hand was obtained independently viewed and interpreted by me and fortunately without any acute fracture or dislocation.  Will provide Ace wrap to his left hand wound.  There are no laceration.  Ice pack placed for comfort.  I offered pain medication but patient declined.     Final diagnoses:  Contusion of right hand, initial encounter    ED Discharge Orders     None          Nivia Colon, PA-C 05/21/24 2043    Ellouise Fine K, DO 05/21/24 2324  "

## 2024-07-07 ENCOUNTER — Encounter (HOSPITAL_COMMUNITY)

## 2024-07-07 ENCOUNTER — Ambulatory Visit: Admitting: Vascular Surgery
